# Patient Record
Sex: Male | Born: 1960 | Race: Black or African American | Hispanic: No | Marital: Married | Smoking: Never smoker
Health system: Southern US, Community
[De-identification: ages and names within clinical notes are randomized; demographics above are authoritative.]

## PROBLEM LIST (undated history)

## (undated) DIAGNOSIS — Z9889 Other specified postprocedural states: Secondary | ICD-10-CM

## (undated) DIAGNOSIS — R112 Nausea with vomiting, unspecified: Secondary | ICD-10-CM

## (undated) DIAGNOSIS — T8859XA Other complications of anesthesia, initial encounter: Secondary | ICD-10-CM

## (undated) DIAGNOSIS — M1990 Unspecified osteoarthritis, unspecified site: Secondary | ICD-10-CM

## (undated) DIAGNOSIS — R55 Syncope and collapse: Secondary | ICD-10-CM

## (undated) DIAGNOSIS — N186 End stage renal disease: Secondary | ICD-10-CM

## (undated) DIAGNOSIS — Z789 Other specified health status: Secondary | ICD-10-CM

## (undated) DIAGNOSIS — I6350 Cerebral infarction due to unspecified occlusion or stenosis of unspecified cerebral artery: Secondary | ICD-10-CM

## (undated) DIAGNOSIS — H548 Legal blindness, as defined in USA: Secondary | ICD-10-CM

## (undated) DIAGNOSIS — I639 Cerebral infarction, unspecified: Secondary | ICD-10-CM

## (undated) DIAGNOSIS — Z992 Dependence on renal dialysis: Secondary | ICD-10-CM

## (undated) DIAGNOSIS — N189 Chronic kidney disease, unspecified: Secondary | ICD-10-CM

## (undated) DIAGNOSIS — H5440 Blindness, one eye, unspecified eye: Secondary | ICD-10-CM

## (undated) DIAGNOSIS — IMO0001 Reserved for inherently not codable concepts without codable children: Secondary | ICD-10-CM

## (undated) DIAGNOSIS — I10 Essential (primary) hypertension: Secondary | ICD-10-CM

## (undated) DIAGNOSIS — T4145XA Adverse effect of unspecified anesthetic, initial encounter: Secondary | ICD-10-CM

## (undated) DIAGNOSIS — D649 Anemia, unspecified: Secondary | ICD-10-CM

## (undated) HISTORY — PX: HERNIA REPAIR: SHX51

## (undated) HISTORY — PX: INSERTION OF DIALYSIS CATHETER: SHX1324

## (undated) HISTORY — PX: EYE SURGERY: SHX253

## (undated) HISTORY — DX: Cerebral infarction due to unspecified occlusion or stenosis of unspecified cerebral artery: I63.50

---

## 1998-03-18 ENCOUNTER — Emergency Department (HOSPITAL_COMMUNITY): Admission: EM | Admit: 1998-03-18 | Discharge: 1998-03-18 | Payer: Self-pay | Admitting: Emergency Medicine

## 1999-02-09 ENCOUNTER — Emergency Department (HOSPITAL_COMMUNITY): Admission: EM | Admit: 1999-02-09 | Discharge: 1999-02-09 | Payer: Self-pay | Admitting: Emergency Medicine

## 1999-10-20 ENCOUNTER — Emergency Department (HOSPITAL_COMMUNITY): Admission: EM | Admit: 1999-10-20 | Discharge: 1999-10-20 | Payer: Self-pay | Admitting: Emergency Medicine

## 1999-11-28 ENCOUNTER — Emergency Department (HOSPITAL_COMMUNITY): Admission: EM | Admit: 1999-11-28 | Discharge: 1999-11-28 | Payer: Self-pay | Admitting: Emergency Medicine

## 2002-10-05 ENCOUNTER — Emergency Department (HOSPITAL_COMMUNITY): Admission: EM | Admit: 2002-10-05 | Discharge: 2002-10-05 | Payer: Self-pay | Admitting: Emergency Medicine

## 2002-10-05 ENCOUNTER — Encounter: Payer: Self-pay | Admitting: Emergency Medicine

## 2003-04-19 ENCOUNTER — Emergency Department (HOSPITAL_COMMUNITY): Admission: AD | Admit: 2003-04-19 | Discharge: 2003-04-19 | Payer: Self-pay | Admitting: Family Medicine

## 2005-02-07 ENCOUNTER — Emergency Department (HOSPITAL_COMMUNITY): Admission: EM | Admit: 2005-02-07 | Discharge: 2005-02-07 | Payer: Self-pay | Admitting: Emergency Medicine

## 2006-01-16 ENCOUNTER — Emergency Department (HOSPITAL_COMMUNITY): Admission: EM | Admit: 2006-01-16 | Discharge: 2006-01-16 | Payer: Self-pay | Admitting: Family Medicine

## 2006-09-09 ENCOUNTER — Ambulatory Visit (HOSPITAL_COMMUNITY): Admission: RE | Admit: 2006-09-09 | Discharge: 2006-09-09 | Payer: Self-pay | Admitting: Pulmonary Disease

## 2006-10-10 ENCOUNTER — Encounter: Admission: RE | Admit: 2006-10-10 | Discharge: 2007-01-08 | Payer: Self-pay | Admitting: Pulmonary Disease

## 2007-01-13 ENCOUNTER — Encounter: Admission: RE | Admit: 2007-01-13 | Discharge: 2007-01-13 | Payer: Self-pay | Admitting: Pulmonary Disease

## 2007-01-23 ENCOUNTER — Encounter (INDEPENDENT_AMBULATORY_CARE_PROVIDER_SITE_OTHER): Payer: Self-pay | Admitting: Pulmonary Disease

## 2007-01-23 ENCOUNTER — Ambulatory Visit (HOSPITAL_COMMUNITY): Admission: RE | Admit: 2007-01-23 | Discharge: 2007-01-23 | Payer: Self-pay | Admitting: Pulmonary Disease

## 2009-11-07 ENCOUNTER — Emergency Department (HOSPITAL_COMMUNITY): Admission: EM | Admit: 2009-11-07 | Discharge: 2009-11-07 | Payer: Self-pay | Admitting: Family Medicine

## 2010-01-25 ENCOUNTER — Inpatient Hospital Stay (HOSPITAL_COMMUNITY): Admission: EM | Admit: 2010-01-25 | Discharge: 2010-02-15 | Payer: Self-pay | Admitting: Emergency Medicine

## 2010-01-26 ENCOUNTER — Encounter (INDEPENDENT_AMBULATORY_CARE_PROVIDER_SITE_OTHER): Payer: Self-pay | Admitting: Internal Medicine

## 2010-01-27 ENCOUNTER — Encounter (INDEPENDENT_AMBULATORY_CARE_PROVIDER_SITE_OTHER): Payer: Self-pay | Admitting: Nephrology

## 2010-01-27 ENCOUNTER — Ambulatory Visit: Payer: Self-pay | Admitting: Vascular Surgery

## 2010-02-16 DIAGNOSIS — D509 Iron deficiency anemia, unspecified: Secondary | ICD-10-CM | POA: Insufficient documentation

## 2010-02-16 DIAGNOSIS — E782 Mixed hyperlipidemia: Secondary | ICD-10-CM | POA: Insufficient documentation

## 2010-02-16 DIAGNOSIS — E785 Hyperlipidemia, unspecified: Secondary | ICD-10-CM | POA: Insufficient documentation

## 2010-02-16 DIAGNOSIS — E213 Hyperparathyroidism, unspecified: Secondary | ICD-10-CM | POA: Insufficient documentation

## 2010-03-01 ENCOUNTER — Ambulatory Visit: Payer: Self-pay | Admitting: Vascular Surgery

## 2010-03-06 ENCOUNTER — Emergency Department (HOSPITAL_COMMUNITY)
Admission: EM | Admit: 2010-03-06 | Discharge: 2010-03-06 | Payer: Self-pay | Source: Home / Self Care | Admitting: Emergency Medicine

## 2010-06-06 ENCOUNTER — Ambulatory Visit: Payer: Self-pay | Admitting: Vascular Surgery

## 2010-06-06 ENCOUNTER — Ambulatory Visit (HOSPITAL_COMMUNITY)
Admission: RE | Admit: 2010-06-06 | Discharge: 2010-06-06 | Payer: Self-pay | Source: Home / Self Care | Admitting: Nephrology

## 2010-07-09 DIAGNOSIS — I639 Cerebral infarction, unspecified: Secondary | ICD-10-CM

## 2010-07-09 HISTORY — DX: Cerebral infarction, unspecified: I63.9

## 2010-07-11 ENCOUNTER — Emergency Department (HOSPITAL_COMMUNITY)
Admission: EM | Admit: 2010-07-11 | Discharge: 2010-07-11 | Payer: Self-pay | Source: Home / Self Care | Admitting: Emergency Medicine

## 2010-07-27 ENCOUNTER — Ambulatory Visit (HOSPITAL_COMMUNITY)
Admission: RE | Admit: 2010-07-27 | Discharge: 2010-07-27 | Payer: Self-pay | Source: Home / Self Care | Attending: Surgery | Admitting: Surgery

## 2010-07-28 NOTE — Op Note (Signed)
NAME:  Shawn [name], Shawn [name]        ACCOUNT NO.:  [id number]  MEDICAL RECORD NO.:  [id number]          PATIENT TYPE:  AMB  LOCATION:  SDS                          FACILITY:  [place]  PHYSICIAN:  Coralie Keens, M.D. DATE OF BIRTH:  [birth year]  DATE OF PROCEDURE:  07/27/2010 DATE OF DISCHARGE:  07/27/2010                              OPERATIVE REPORT   PREOPERATIVE DIAGNOSIS:  Bilateral inguinal hernias.  POSTOPERATIVE DIAGNOSIS:  Right inguinal hernia.  PROCEDURE:  Laparoscopic right inguinal repair with mesh.  SURGEON:  Coralie Keens, MD  ANESTHESIA:  General and 0.5% Marcaine.  ESTIMATED BLOOD LOSS:  Minimal.  INDICATIONS:  Shawn [name] is a 50 year old gentleman who presents with a very large right inguinal hernia.  There is also a question of left inguinal area physical exam and the decision was made to proceed with bilateral repair.  FINDINGS:  The patient was found to have incarcerated right inguinal hernia.  There was no evidence of left inguinal hernia.  The right inguinal hernia was an indirect hernia.  It was repaired with a 6-inch x 6-inch piece of Ultrapro mesh.  PROCEDURE IN DETAIL:  The patient was brought to the operating room and identified as Shawn [name].  He was placed on the operating table and general anesthesia was induced.  His abdomen was then prepped and draped in the usual sterile fashion.  Using a #15 blade, a small vertical incision made below the umbilicus.  This was carried down the fascia which was then opened with a scalpel.  The rectus muscle was then elevated.  The dissecting balloon was then passed underneath the rectus sheath and manipulated toward the pubis.  The dissecting balloon was then insufflated under direct vision dissecting out the preperitoneal space.  The dissecting balloon was then removed and insufflation was begun with carbon dioxide.  Two 5-mm ports were then placed in the patient's lower midline both  under direct vision.  Dissection was then carried out in the running inguinal area.  The testicular cord and structures were identified and attempt was made to completely reduce the contents of the hernia sac.  I had to open up the sac slightly and with direct pressure from outside I was finally able to reduce the colon and small bowel from the hernia sac.  I was then able to dissect the rest of the sac and reduce it from the testicular cord using the laparoscopic scissors as well.  Since the peritoneum was opened, I had to place a Veress needle in the left upper quadrant in order to decompress the abdomen.  This allowed me to maintain opening of the preperitoneal space.  Once everything was completely reduced, I examined the left inguinal area and found no evidence of indirect or direct inguinal hernia.  I then brought a 6-inch x 6-inch piece of Ultrapro mesh onto the field and fashioned appropriately.  I placed it through the umbilical port and then opened in an onlay fashion on the right inguinal floor.  I then tacked it to the pubic tubercle up the abdominal wall and out laterally.  Good coverage of inguinal floor and its contents appeared to be achieved.  I then  removed all of the ports and the mesh was seemed to lie appropriately as the preperitoneal space was collapsed.  I then closed the fascia at the umbilicus with figure-of- eight 0 Vicryl suture.  All incisions were anesthetized with Marcaine. I performed ilioinguinal nerve block with Marcaine as well.  All skin incisions were closed with a 4-0 Monocryl suture.  Steri-Strips and Band- Aids were then applied.  The patient tolerated the procedure well.  All counts were correct at the end of the procedure.  The patient was then extubated in the operating room and taken in stable condition to the recovery room.     Coralie Keens, M.D.     DB/MEDQ  D:  07/27/2010  T:  07/28/2010  Job:  [id number]  Electronically Signed by  Coralie Keens M.D. on 07/28/2010 11:03:49 AM

## 2010-07-31 LAB — PROTIME-INR: Prothrombin Time: 13.9 seconds (ref 11.6–15.2)

## 2010-07-31 LAB — GLUCOSE, CAPILLARY
Glucose-Capillary: 100 mg/dL — ABNORMAL HIGH (ref 70–99)
Glucose-Capillary: 114 mg/dL — ABNORMAL HIGH (ref 70–99)
Glucose-Capillary: 110 mg/dL — ABNORMAL HIGH (ref 70–99)

## 2010-07-31 LAB — BASIC METABOLIC PANEL
CO2: 29 mEq/L (ref 19–32)
Calcium: 9.1 mg/dL (ref 8.4–10.5)
Chloride: 101 mEq/L (ref 96–112)
Glucose, Bld: 112 mg/dL — ABNORMAL HIGH (ref 70–99)
Potassium: 4.9 mEq/L (ref 3.5–5.1)
Sodium: 139 mEq/L (ref 135–145)

## 2010-07-31 LAB — CBC
RBC: 4.8 MIL/uL (ref 4.22–5.81)
RDW: 15.9 % — ABNORMAL HIGH (ref 11.5–15.5)
WBC: 7.3 10*3/uL (ref 4.0–10.5)

## 2010-07-31 LAB — APTT: aPTT: 34 seconds (ref 24–37)

## 2010-08-10 ENCOUNTER — Ambulatory Visit: Payer: Self-pay

## 2010-08-17 ENCOUNTER — Ambulatory Visit (INDEPENDENT_AMBULATORY_CARE_PROVIDER_SITE_OTHER): Payer: Medicare Other

## 2010-08-17 DIAGNOSIS — N186 End stage renal disease: Secondary | ICD-10-CM

## 2010-08-17 DIAGNOSIS — T82898A Other specified complication of vascular prosthetic devices, implants and grafts, initial encounter: Secondary | ICD-10-CM

## 2010-08-18 NOTE — H&P (Signed)
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  HISTORY OF PRESENT ILLNESS:  Patient is a 50 year old gentleman who is on hemodialysis Monday, Wednesday, and Fridays at the [facility] with Dr. Moshe Cipro.  The patient developed approximately a month ago problems with his hand while on hemodialysis. He states his hand has become progressively weaker.  He has had reduced grip.  He notices that when he is on hemodialysis, he can hardly make a fist without pain.  He also develops numbness in this area.  These symptoms stop when he is off hemodialysis.  However, he will have continued achiness in his forearm and hand 1-2 hours after he is off the machine and also when he just pushes up from a sitting position.  PAST MEDICAL HISTORY:  Significant for end-stage renal disease, diabetes, hypertension, hyperparathyroidism, iron deficiency anemia, and hyperlipidemia.  REVIEW OF SYSTEMS:  A 10-point review of systems reviewed.  He has signs and symptoms of steal, as noted above.  Otherwise he denies any chest pain, shortness of breath, GI symptoms, or other musculoskeletal issues.  MEDICATIONS:  Tylenol 325 mg 2 tablets every 4 hours as needed for pain, Lipitor 40 mg daily, Os-Cal 500 mg 3 times a day with meals, Epogen 28,000 units with dialysis treatment, NovoLog 70/30 15 units every a.m. and 10 units in the p.m., INFeD 50 mg weekly on hemodialysis on Tuesdays, Normodyne 200 mg b.i.d., Fosrenol 1000 mg 3 times a day with meals, Nepro 237 ml 1 p.o. b.i.d., Zemplar 2 mcg every hemodialysis treatment, Nephro-Vite 1 tablet daily, sorbitol 30 ml at bedtime for constipation.  He is also on clonidine 0.1 mg 2 times a day as needed if diastolic pressures are greater than 90.  He is also started on lisinopril 40 mg daily for his  blood pressure.  VASCULAR DOPPLER:  The patient had good flow per Doppler in his radial, ulnar, and palmar arch.  PHYSICAL EXAMINATION:  A well-developed, well-nourished gentleman in no acute distress.  Heart rate was 90.  His blood pressure on the right side was 115/74.  Saturations were 100%.  Bilateral upper extremities: He has a 5/5 grip on the right and a -4/5 grip on the left.  He had a good thrill and bruit in the left Cimino fistula.  He had a palpable radial pulse beyond the fistula and as noted above, Doppler signal in the ulnar vessel and across the palmar arch.  His hand was warm and pink.  He had good sensation in the hand as well.  ASSESSMENT/PLAN:  Steal in the left upper extremity.  Plan is to ligate the fistula and place a catheter and possibly a new access in the future.  We will also repeat the vein mapping in the right upper extremity.  Wray Kearns, PA-C  Charles E. Fields, MD Electronically Signed  RR/MEDQ  D:  08/17/2010  T:  08/17/2010  Job:  [id number]

## 2010-08-29 ENCOUNTER — Ambulatory Visit (HOSPITAL_COMMUNITY)
Admission: RE | Admit: 2010-08-29 | Discharge: 2010-08-29 | Disposition: A | Discharge status: Home / Self Care | Payer: Medicare Other | Source: Ambulatory Visit | Attending: Vascular Surgery | Admitting: Vascular Surgery

## 2010-08-29 ENCOUNTER — Ambulatory Visit (HOSPITAL_COMMUNITY): Payer: Medicare Other

## 2010-08-29 DIAGNOSIS — E119 Type 2 diabetes mellitus without complications: Secondary | ICD-10-CM | POA: Insufficient documentation

## 2010-08-29 DIAGNOSIS — N186 End stage renal disease: Secondary | ICD-10-CM

## 2010-08-29 DIAGNOSIS — Z01818 Encounter for other preprocedural examination: Secondary | ICD-10-CM | POA: Insufficient documentation

## 2010-08-29 DIAGNOSIS — I120 Hypertensive chronic kidney disease with stage 5 chronic kidney disease or end stage renal disease: Secondary | ICD-10-CM

## 2010-08-29 DIAGNOSIS — T82898A Other specified complication of vascular prosthetic devices, implants and grafts, initial encounter: Secondary | ICD-10-CM

## 2010-08-29 DIAGNOSIS — Z01812 Encounter for preprocedural laboratory examination: Secondary | ICD-10-CM | POA: Insufficient documentation

## 2010-08-29 DIAGNOSIS — Y832 Surgical operation with anastomosis, bypass or graft as the cause of abnormal reaction of the patient, or of later complication, without mention of misadventure at the time of the procedure: Secondary | ICD-10-CM | POA: Insufficient documentation

## 2010-08-29 LAB — GLUCOSE, CAPILLARY
Glucose-Capillary: 116 mg/dL — ABNORMAL HIGH (ref 70–99)
Glucose-Capillary: 102 mg/dL — ABNORMAL HIGH (ref 70–99)

## 2010-08-29 LAB — POCT I-STAT 4, (NA,K, GLUC, HGB,HCT)
Glucose, Bld: 115 mg/dL — ABNORMAL HIGH (ref 70–99)
HCT: 38 % — ABNORMAL LOW (ref 39.0–52.0)

## 2010-08-29 LAB — SURGICAL PCR SCREEN
MRSA, PCR: NEGATIVE
Staphylococcus aureus: NEGATIVE

## 2010-08-30 NOTE — Op Note (Signed)
NAME:  MAZIN, FLORKOWSKI        ACCOUNT NO.:  [id number]  MEDICAL RECORD NO.:  [id number]           PATIENT TYPE:  O  LOCATION:  SDSC                         FACILITY:  [place]  PHYSICIAN:  Verlyn Lambert E. Jessilynn Taft, MD  DATE OF BIRTH:  01/16/1961  DATE OF PROCEDURE:  08/29/2010 DATE OF DISCHARGE:  08/29/2010                              OPERATIVE REPORT   PROCEDURES: 1. Ultrasound of the neck. 2. Attempted right internal jugular vein catheter. 3. Placement of left internal jugular vein Diatek catheter. 4. Ligation of left radiocephalic AV fistula.  PREOPERATIVE DIAGNOSIS:  Ischemic steal left hand.  POSTOPERATIVE DIAGNOSIS:  Ischemic steal left hand.  ANESTHESIA:  General.  OPERATIVE FINDINGS:  A 23-cm catheter in left internal jugular vein.  OPERATIVE DETAILS:  After obtaining informed consent, the patient was taken to the operating room.  The patient was placed in a supine position on the operating table.  After induction of general anesthesia and placement of laryngeal mask, the patient's entire neck and chest were prepped and draped in usual sterile fashion.  An ultrasound was used to identify the right internal jugular vein.  This was fairly small, but appeared to be compressible; however, on attempting to cannulate the right internal jugular vein with an introducer needle, I was not able to aspirate blood back.  Therefore, attempts were abandoned on the right side.  Attention was then turned to the left internal jugular vein.  This had normal compressibility and respiratory variation.  An introducer needle was introduced easily into the left internal jugular vein and there was good blood return.  A 0.035 J-tipped guidewire was then threaded to the left internal jugular vein down into the inferior vena cava under fluoroscopic guidance.  Next, sequential 12, 14, and 16-French dilators with peel-away sheath placed over the guidewire into the right atrium.  Guidewire and  dilator were removed and a 23-cm Diatek catheter was placed through the left internal jugular vein down in the right atrium under fluoroscopic guidance.  Catheter was tunneled subcutaneously, cut to length, and hub attached.  Catheter was noted to flush and draw easily.  Catheter was sutured to skin with nylon sutures.  The neck insertion site was closed with Vicryl stitch. Catheter was inspected under fluoroscopy, found its tip to be in the right atrium, no kinks throughout its course.  Catheter was then loaded with concentrated heparin solution.  Attention was then turned to the patient's left upper extremity.  The patient was prepped and draped in usual sterile fashion from the elbow down to the hand.  A longitudinal incision was then made through a preexisting scar on the radial aspect of the left forearm.  Incision was carried down through the subcutaneous tissues down to the level of preexisting fistula.  There was flow through the fistula.  The fistula was dissected free circumferentially approximately 1 cm distal to the anastomosis and ligated twice with 2-0 silk tie.  There was a good palpable radial pulse below the fistula after ligation of the fistula.  Next, the skin was reapproximated using running 4-0 Vicryl subcuticular stitch and Dermabond applied to the skin.  The patient tolerated the procedure well and  there were no complications.  Instrument, sponge, and needle counts were correct at the end of the case.  The patient was taken to the recovery room in a stable condition.     Jessy Oto. Carder Yin, MD     CEF/MEDQ  D:  08/29/2010  T:  08/30/2010  Job:  [id number]  Electronically Signed by Ruta Hinds MD on 08/30/2010 03:10:11 PM

## 2010-09-07 ENCOUNTER — Encounter (INDEPENDENT_AMBULATORY_CARE_PROVIDER_SITE_OTHER): Payer: Medicare Other

## 2010-09-07 ENCOUNTER — Ambulatory Visit (INDEPENDENT_AMBULATORY_CARE_PROVIDER_SITE_OTHER): Payer: Medicare Other | Admitting: Vascular Surgery

## 2010-09-07 DIAGNOSIS — Z01810 Encounter for preprocedural cardiovascular examination: Secondary | ICD-10-CM

## 2010-09-07 DIAGNOSIS — N186 End stage renal disease: Secondary | ICD-10-CM

## 2010-09-08 NOTE — Assessment & Plan Note (Signed)
OFFICE VISIT  Shawn [name], NORMILE DOB:  12-07-1960                                       09/07/2010 [id number]  The patient returns for followup today.  He recently had ligation of the left radiocephalic AV fistula and placement of a left internal jugular vein Diatek catheter.  This was done for steal.  On exam today in the office he has regained significant motor strength in his left hand and is now able to fully form a fist and has essentially 4/5 motor strength in his left hand.  He still has some sensory deficit.  Overall the left arm is improved.  The incision is healing well.  As far as planning his new access right upper extremity has a 2+ brachial and radial pulse.  He had a vein mapping ultrasound today which shows cephalic vein in the upper arm is between 25 mm and 35 mm in diameter.  The forearm cephalic vein is quite small.  The basilic vein in the upper arm was 22 mm to 28 mm in diameter.  I believe the best option at this point for the patient would be placement of a right brachiocephalic AV fistula.  I did discuss with him today that he still would be at risk for possible steal once again and also risk of nonmaturation of the fistula.  He understands and agrees to proceed.  He wished to delay his fistula placement for a few weeks because he stated he had been having an operation essentially once every few weeks for the last 6 months.  We have scheduled his fistula placement for 09/26/2010.    Jessy Oto. Fields, MD Electronically Signed  CEF/MEDQ  D:  09/07/2010  T:  09/08/2010  Job:  [id number]  cc:   [facility]

## 2010-09-21 LAB — BASIC METABOLIC PANEL
BUN: 31 mg/dL — ABNORMAL HIGH (ref 6–23)
CO2: 28 mEq/L (ref 19–32)
Chloride: 100 mEq/L (ref 96–112)
GFR calc Af Amer: 6 mL/min — ABNORMAL LOW (ref >60)
Glucose, Bld: 90 mg/dL (ref 70–99)

## 2010-09-21 LAB — CBC
MCH: 27.2 pg (ref 26.0–34.0)
MCV: 85.3 fL (ref 78.0–100.0)
Platelets: 204 10*3/uL (ref 150–400)
RBC: 4.7 MIL/uL (ref 4.22–5.81)

## 2010-09-21 LAB — POCT I-STAT, CHEM 8: BUN: 36 mg/dL — ABNORMAL HIGH (ref 6–23)

## 2010-09-21 LAB — DIFFERENTIAL
Basophils Absolute: 0.1 10*3/uL (ref 0.0–0.1)
Eosinophils Absolute: 0.5 10*3/uL (ref 0.0–0.7)
Lymphocytes Relative: 23 % (ref 12–46)
Monocytes Absolute: 0.5 10*3/uL (ref 0.1–1.0)
Monocytes Relative: 7 % (ref 3–12)
Neutro Abs: 4.5 10*3/uL (ref 1.7–7.7)
Neutrophils Relative %: 63 % (ref 43–77)

## 2010-09-21 LAB — GLUCOSE, CAPILLARY

## 2010-09-21 LAB — POCT CARDIAC MARKERS: Troponin i, poc: <0.05 ng/mL (ref 0.00–0.09)

## 2010-09-22 LAB — COMPREHENSIVE METABOLIC PANEL
AST: 18 U/L (ref 0–37)
BUN: 28 mg/dL — ABNORMAL HIGH (ref 6–23)
CO2: 25 mEq/L (ref 19–32)
Calcium: 8 mg/dL — ABNORMAL LOW (ref 8.4–10.5)
Chloride: 102 mEq/L (ref 96–112)
Creatinine, Ser: 8.48 mg/dL — ABNORMAL HIGH (ref 0.4–1.5)
GFR calc Af Amer: 8 mL/min — ABNORMAL LOW (ref >60)
GFR calc non Af Amer: 7 mL/min — ABNORMAL LOW (ref >60)
Glucose, Bld: 155 mg/dL — ABNORMAL HIGH (ref 70–99)
Total Bilirubin: 0.7 mg/dL (ref 0.3–1.2)

## 2010-09-22 LAB — RENAL FUNCTION PANEL
Albumin: 2.6 g/dL — ABNORMAL LOW (ref 3.5–5.2)
Albumin: 2.7 g/dL — ABNORMAL LOW (ref 3.5–5.2)
Albumin: 2.7 g/dL — ABNORMAL LOW (ref 3.5–5.2)
Calcium: 8.2 mg/dL — ABNORMAL LOW (ref 8.4–10.5)
Calcium: 8.4 mg/dL (ref 8.4–10.5)
Chloride: 102 mEq/L (ref 96–112)
Chloride: 102 mEq/L (ref 96–112)
Creatinine, Ser: 7.21 mg/dL — ABNORMAL HIGH (ref 0.4–1.5)
Creatinine, Ser: 8.69 mg/dL — ABNORMAL HIGH (ref 0.4–1.5)
GFR calc Af Amer: 10 mL/min — ABNORMAL LOW (ref >60)
GFR calc Af Amer: 8 mL/min — ABNORMAL LOW (ref >60)
GFR calc Af Amer: 8 mL/min — ABNORMAL LOW (ref >60)
GFR calc non Af Amer: 6 mL/min — ABNORMAL LOW (ref >60)
GFR calc non Af Amer: 7 mL/min — ABNORMAL LOW (ref >60)
GFR calc non Af Amer: 8 mL/min — ABNORMAL LOW (ref >60)
Phosphorus: 4.5 mg/dL (ref 2.3–4.6)
Potassium: 3.8 mEq/L (ref 3.5–5.1)

## 2010-09-22 LAB — GLUCOSE, CAPILLARY
Glucose-Capillary: 115 mg/dL — ABNORMAL HIGH (ref 70–99)
Glucose-Capillary: 125 mg/dL — ABNORMAL HIGH (ref 70–99)
Glucose-Capillary: 126 mg/dL — ABNORMAL HIGH (ref 70–99)
Glucose-Capillary: 145 mg/dL — ABNORMAL HIGH (ref 70–99)
Glucose-Capillary: 163 mg/dL — ABNORMAL HIGH (ref 70–99)
Glucose-Capillary: 167 mg/dL — ABNORMAL HIGH (ref 70–99)
Glucose-Capillary: 204 mg/dL — ABNORMAL HIGH (ref 70–99)
Glucose-Capillary: 222 mg/dL — ABNORMAL HIGH (ref 70–99)
Glucose-Capillary: 260 mg/dL — ABNORMAL HIGH (ref 70–99)
Glucose-Capillary: 264 mg/dL — ABNORMAL HIGH (ref 70–99)
Glucose-Capillary: 68 mg/dL — ABNORMAL LOW (ref 70–99)
Glucose-Capillary: 79 mg/dL (ref 70–99)
Glucose-Capillary: 89 mg/dL (ref 70–99)

## 2010-09-22 LAB — CBC
HCT: 27.9 % — ABNORMAL LOW (ref 39.0–52.0)
Hemoglobin: 8.9 g/dL — ABNORMAL LOW (ref 13.0–17.0)
MCH: 27.4 pg (ref 26.0–34.0)
MCH: 27.5 pg (ref 26.0–34.0)
MCH: 29.2 pg (ref 26.0–34.0)
MCHC: 30.7 g/dL (ref 30.0–36.0)
MCHC: 31.8 g/dL (ref 30.0–36.0)
MCV: 91.8 fL (ref 78.0–100.0)
Platelets: 289 10*3/uL (ref 150–400)
Platelets: 318 10*3/uL (ref 150–400)
Platelets: 327 10*3/uL (ref 150–400)
RBC: 3.04 MIL/uL — ABNORMAL LOW (ref 4.22–5.81)
RBC: 3.51 MIL/uL — ABNORMAL LOW (ref 4.22–5.81)
RBC: 3.81 MIL/uL — ABNORMAL LOW (ref 4.22–5.81)
WBC: 10.4 10*3/uL (ref 4.0–10.5)

## 2010-09-22 LAB — PHOSPHORUS: Phosphorus: 5 mg/dL — ABNORMAL HIGH (ref 2.3–4.6)

## 2010-09-22 NOTE — Procedures (Unsigned)
CEPHALIC VEIN MAPPING  INDICATION:  Preoperative AVF placement.  HISTORY: End-stage renal disease, left ligated AVF due to steal syndrome.  EXAM: The right cephalic vein is compressible.  Diameter measurements range from 0.19 to 0.37 cm.  The right basilic vein is compressible.  Diameter measurements range from 0.18 to 0.28 cm.  See attached worksheet for all measurements.  IMPRESSION:  Patent right cephalic and basilic veins with diameter measurements as described above.  ___________________________________________ Jessy Oto. Oneida Alar, MD  EM/MEDQ  D:  09/07/2010  T:  09/07/2010  Job:  [id number]

## 2010-09-23 LAB — RENAL FUNCTION PANEL
Albumin: 2.4 g/dL — ABNORMAL LOW (ref 3.5–5.2)
BUN: 32 mg/dL — ABNORMAL HIGH (ref 6–23)
CO2: 18 mEq/L — ABNORMAL LOW (ref 19–32)
CO2: 26 mEq/L (ref 19–32)
Calcium: 7.7 mg/dL — ABNORMAL LOW (ref 8.4–10.5)
Calcium: 8 mg/dL — ABNORMAL LOW (ref 8.4–10.5)
Calcium: 8.1 mg/dL — ABNORMAL LOW (ref 8.4–10.5)
Chloride: 103 mEq/L (ref 96–112)
Chloride: 103 mEq/L (ref 96–112)
Chloride: 107 mEq/L (ref 96–112)
Creatinine, Ser: 10.63 mg/dL — ABNORMAL HIGH (ref 0.4–1.5)
GFR calc Af Amer: 11 mL/min — ABNORMAL LOW (ref >60)
GFR calc Af Amer: 6 mL/min — ABNORMAL LOW (ref >60)
GFR calc Af Amer: 8 mL/min — ABNORMAL LOW (ref >60)
GFR calc Af Amer: 9 mL/min — ABNORMAL LOW (ref >60)
GFR calc non Af Amer: 5 mL/min — ABNORMAL LOW (ref >60)
GFR calc non Af Amer: 7 mL/min — ABNORMAL LOW (ref >60)
GFR calc non Af Amer: 7 mL/min — ABNORMAL LOW (ref >60)
GFR calc non Af Amer: 9 mL/min — ABNORMAL LOW (ref >60)
Glucose, Bld: 153 mg/dL — ABNORMAL HIGH (ref 70–99)
Phosphorus: 4.1 mg/dL (ref 2.3–4.6)
Potassium: 4.1 mEq/L (ref 3.5–5.1)
Sodium: 136 mEq/L (ref 135–145)
Sodium: 138 mEq/L (ref 135–145)
Sodium: 138 mEq/L (ref 135–145)

## 2010-09-23 LAB — GLUCOSE, CAPILLARY
Glucose-Capillary: 105 mg/dL — ABNORMAL HIGH (ref 70–99)
Glucose-Capillary: 110 mg/dL — ABNORMAL HIGH (ref 70–99)
Glucose-Capillary: 129 mg/dL — ABNORMAL HIGH (ref 70–99)
Glucose-Capillary: 130 mg/dL — ABNORMAL HIGH (ref 70–99)
Glucose-Capillary: 132 mg/dL — ABNORMAL HIGH (ref 70–99)
Glucose-Capillary: 144 mg/dL — ABNORMAL HIGH (ref 70–99)
Glucose-Capillary: 159 mg/dL — ABNORMAL HIGH (ref 70–99)
Glucose-Capillary: 181 mg/dL — ABNORMAL HIGH (ref 70–99)
Glucose-Capillary: 187 mg/dL — ABNORMAL HIGH (ref 70–99)
Glucose-Capillary: 198 mg/dL — ABNORMAL HIGH (ref 70–99)
Glucose-Capillary: 200 mg/dL — ABNORMAL HIGH (ref 70–99)
Glucose-Capillary: 212 mg/dL — ABNORMAL HIGH (ref 70–99)
Glucose-Capillary: 217 mg/dL — ABNORMAL HIGH (ref 70–99)
Glucose-Capillary: 222 mg/dL — ABNORMAL HIGH (ref 70–99)
Glucose-Capillary: 231 mg/dL — ABNORMAL HIGH (ref 70–99)
Glucose-Capillary: 240 mg/dL — ABNORMAL HIGH (ref 70–99)
Glucose-Capillary: 261 mg/dL — ABNORMAL HIGH (ref 70–99)
Glucose-Capillary: 272 mg/dL — ABNORMAL HIGH (ref 70–99)
Glucose-Capillary: 299 mg/dL — ABNORMAL HIGH (ref 70–99)
Glucose-Capillary: 33 mg/dL — CL (ref 70–99)
Glucose-Capillary: 34 mg/dL — CL (ref 70–99)
Glucose-Capillary: 75 mg/dL (ref 70–99)
Glucose-Capillary: 78 mg/dL (ref 70–99)
Glucose-Capillary: <10 mg/dL — CL (ref 70–99)
Glucose-Capillary: <10 mg/dL — CL (ref 70–99)

## 2010-09-23 LAB — CBC
HCT: 21.4 % — ABNORMAL LOW (ref 39.0–52.0)
HCT: 22 % — ABNORMAL LOW (ref 39.0–52.0)
HCT: 23.8 % — ABNORMAL LOW (ref 39.0–52.0)
Hemoglobin: 7.3 g/dL — ABNORMAL LOW (ref 13.0–17.0)
Hemoglobin: 7.8 g/dL — ABNORMAL LOW (ref 13.0–17.0)
Hemoglobin: 8.1 g/dL — ABNORMAL LOW (ref 13.0–17.0)
MCH: 28.6 pg (ref 26.0–34.0)
MCH: 28.8 pg (ref 26.0–34.0)
MCH: 29 pg (ref 26.0–34.0)
MCH: 29.1 pg (ref 26.0–34.0)
MCHC: 32.9 g/dL (ref 30.0–36.0)
MCHC: 33.2 g/dL (ref 30.0–36.0)
MCHC: 33.3 g/dL (ref 30.0–36.0)
MCV: 86.3 fL (ref 78.0–100.0)
MCV: 87.1 fL (ref 78.0–100.0)
MCV: 89.6 fL (ref 78.0–100.0)
Platelets: 320 10*3/uL (ref 150–400)
RBC: 2.54 MIL/uL — ABNORMAL LOW (ref 4.22–5.81)
RBC: 2.65 MIL/uL — ABNORMAL LOW (ref 4.22–5.81)
RBC: 2.82 MIL/uL — ABNORMAL LOW (ref 4.22–5.81)
RDW: 15.3 % (ref 11.5–15.5)
RDW: 15.5 % (ref 11.5–15.5)
RDW: 15.9 % — ABNORMAL HIGH (ref 11.5–15.5)
RDW: 16.2 % — ABNORMAL HIGH (ref 11.5–15.5)
WBC: 10.9 10*3/uL — ABNORMAL HIGH (ref 4.0–10.5)
WBC: 11.3 10*3/uL — ABNORMAL HIGH (ref 4.0–10.5)
WBC: 11.6 10*3/uL — ABNORMAL HIGH (ref 4.0–10.5)
WBC: 12.4 10*3/uL — ABNORMAL HIGH (ref 4.0–10.5)
WBC: 7.1 10*3/uL (ref 4.0–10.5)

## 2010-09-23 LAB — DIFFERENTIAL
Basophils Absolute: 0.1 10*3/uL (ref 0.0–0.1)
Basophils Relative: 1 % (ref 0–1)
Eosinophils Absolute: 0.6 10*3/uL (ref 0.0–0.7)
Monocytes Absolute: 0.5 10*3/uL (ref 0.1–1.0)
Monocytes Relative: 6 % (ref 3–12)
Neutro Abs: 5.5 10*3/uL (ref 1.7–7.7)
Neutrophils Relative %: 64 % (ref 43–77)

## 2010-09-23 LAB — COMPREHENSIVE METABOLIC PANEL
Alkaline Phosphatase: 69 U/L (ref 39–117)
BUN: 58 mg/dL — ABNORMAL HIGH (ref 6–23)
Chloride: 110 mEq/L (ref 96–112)
Creatinine, Ser: 11.17 mg/dL — ABNORMAL HIGH (ref 0.4–1.5)
Glucose, Bld: 133 mg/dL — ABNORMAL HIGH (ref 70–99)
Potassium: 4.4 mEq/L (ref 3.5–5.1)
Total Bilirubin: 0.8 mg/dL (ref 0.3–1.2)
Total Protein: 5 g/dL — ABNORMAL LOW (ref 6.0–8.3)

## 2010-09-23 LAB — BASIC METABOLIC PANEL
BUN: 63 mg/dL — ABNORMAL HIGH (ref 6–23)
BUN: 65 mg/dL — ABNORMAL HIGH (ref 6–23)
CO2: 17 mEq/L — ABNORMAL LOW (ref 19–32)
CO2: 20 mEq/L (ref 19–32)
Calcium: 7.7 mg/dL — ABNORMAL LOW (ref 8.4–10.5)
Calcium: 7.8 mg/dL — ABNORMAL LOW (ref 8.4–10.5)
Chloride: 101 mEq/L (ref 96–112)
Creatinine, Ser: 11.69 mg/dL — ABNORMAL HIGH (ref 0.4–1.5)
Creatinine, Ser: 12.14 mg/dL — ABNORMAL HIGH (ref 0.4–1.5)
GFR calc Af Amer: 6 mL/min — ABNORMAL LOW (ref >60)
GFR calc non Af Amer: 5 mL/min — ABNORMAL LOW (ref >60)
Glucose, Bld: 178 mg/dL — ABNORMAL HIGH (ref 70–99)
Glucose, Bld: 226 mg/dL — ABNORMAL HIGH (ref 70–99)
Potassium: 4.8 mEq/L (ref 3.5–5.1)
Sodium: 134 mEq/L — ABNORMAL LOW (ref 135–145)

## 2010-09-23 LAB — DRUGS OF ABUSE SCREEN W/O ALC, ROUTINE URINE
Amphetamine Screen, Ur: NEGATIVE
Barbiturate Quant, Ur: NEGATIVE
Benzodiazepines.: NEGATIVE
Marijuana Metabolite: NEGATIVE
Methadone: NEGATIVE
Phencyclidine (PCP): NEGATIVE
Propoxyphene: NEGATIVE

## 2010-09-23 LAB — HEPATIC FUNCTION PANEL
ALT: 9 U/L (ref 0–53)
AST: 14 U/L (ref 0–37)
Albumin: 3.2 g/dL — ABNORMAL LOW (ref 3.5–5.2)
Alkaline Phosphatase: 86 U/L (ref 39–117)
Total Protein: 6.2 g/dL (ref 6.0–8.3)

## 2010-09-23 LAB — URINE MICROSCOPIC-ADD ON

## 2010-09-23 LAB — IRON AND TIBC
Iron: 33 ug/dL — ABNORMAL LOW (ref 42–135)
Saturation Ratios: 17 % — ABNORMAL LOW (ref 20–55)
Saturation Ratios: 18 % — ABNORMAL LOW (ref 20–55)
TIBC: 192 ug/dL — ABNORMAL LOW (ref 215–435)
TIBC: 195 ug/dL — ABNORMAL LOW (ref 215–435)
UIBC: 157 ug/dL
UIBC: 162 ug/dL

## 2010-09-23 LAB — URINALYSIS, ROUTINE W REFLEX MICROSCOPIC
Bilirubin Urine: NEGATIVE
Nitrite: NEGATIVE
Protein, ur: >300 mg/dL — AB
Specific Gravity, Urine: 1.017 (ref 1.005–1.030)
Urobilinogen, UA: 0.2 mg/dL (ref 0.0–1.0)

## 2010-09-23 LAB — HEPATITIS B CORE ANTIBODY, TOTAL: Hep B Core Total Ab: NEGATIVE

## 2010-09-23 LAB — PTH, INTACT AND CALCIUM: PTH: 345.5 pg/mL — ABNORMAL HIGH (ref 14.0–72.0)

## 2010-09-23 LAB — VITAMIN D 25 HYDROXY (VIT D DEFICIENCY, FRACTURES): Vit D, 25-Hydroxy: <10 ng/mL — ABNORMAL LOW (ref 30–89)

## 2010-09-23 LAB — VITAMIN B12: Vitamin B-12: 234 pg/mL (ref 211–911)

## 2010-09-23 LAB — PROTEIN, URINE, RANDOM: Total Protein, Urine: 343 mg/dL

## 2010-09-23 LAB — CHLORIDE, URINE, RANDOM: Chloride Urine: 94 mEq/L

## 2010-09-23 LAB — LIPID PANEL
Cholesterol: 207 mg/dL — ABNORMAL HIGH (ref 0–200)
Triglycerides: 115 mg/dL (ref <150)

## 2010-09-23 LAB — FERRITIN: Ferritin: 458 ng/mL — ABNORMAL HIGH (ref 22–322)

## 2010-09-23 LAB — HEPATITIS B SURFACE ANTIBODY,QUALITATIVE: Hep B S Ab: NEGATIVE

## 2010-09-23 LAB — HEMOGLOBIN A1C
Hgb A1c MFr Bld: 6.8 % — ABNORMAL HIGH (ref <5.7)
Mean Plasma Glucose: 148 mg/dL — ABNORMAL HIGH (ref <117)

## 2010-09-23 LAB — RETICULOCYTES: RBC.: 2.66 MIL/uL — ABNORMAL LOW (ref 4.22–5.81)

## 2010-09-23 LAB — PTH-RELATED PEPTIDE

## 2010-09-26 ENCOUNTER — Ambulatory Visit (HOSPITAL_COMMUNITY)
Admission: RE | Admit: 2010-09-26 | Discharge: 2010-09-26 | Disposition: A | Discharge status: Home / Self Care | Payer: Medicare Other | Source: Ambulatory Visit | Attending: Vascular Surgery | Admitting: Vascular Surgery

## 2010-09-26 DIAGNOSIS — N186 End stage renal disease: Secondary | ICD-10-CM | POA: Insufficient documentation

## 2010-09-26 DIAGNOSIS — I120 Hypertensive chronic kidney disease with stage 5 chronic kidney disease or end stage renal disease: Secondary | ICD-10-CM

## 2010-09-26 LAB — POCT I-STAT 4, (NA,K, GLUC, HGB,HCT): Sodium: 135 mEq/L (ref 135–145)

## 2010-09-26 LAB — GLUCOSE, CAPILLARY
Glucose-Capillary: 89 mg/dL (ref 70–99)
Glucose-Capillary: 95 mg/dL (ref 70–99)

## 2010-10-05 NOTE — Op Note (Signed)
NAME:  Shawn [name], Shawn [name]        ACCOUNT NO.:  [id number]  MEDICAL RECORD NO.:  [id number]           PATIENT TYPE:  O  LOCATION:  SDSC                         FACILITY:  [place]  PHYSICIAN:  Charles E. Fields, MD  DATE OF BIRTH:  [birth year]  DATE OF PROCEDURE:  09/26/2010 DATE OF DISCHARGE:  09/26/2010                              OPERATIVE REPORT   PROCEDURE:  Right brachiocephalic arteriovenous fistula.  PREOPERATIVE DIAGNOSIS:  End-stage renal disease.  POSTOPERATIVE DIAGNOSIS:  End-stage renal disease.  ANESTHESIA:  Local with IV sedation.  ASSISTANT:  Leta Baptist, PA-C  OPERATIVE FINDINGS:  [id number] right cephalic vein.  OPERATIVE DETAILS:  After obtaining informed consent, the patient was taken to the operating room.  The patient was placed in a supine position on the operating table.  The patient's entire right upper extremity was prepped and draped in usual sterile fashion after adequate sedation.  Local anesthesia was then infiltrated in the right antecubital crease.  A transverse incision was made in this location and carried down through subcutaneous tissues down to the level of the cephalic vein.  There were some spasm within the vein, but overall was above 2 mm in diameter.  It did become slightly larger in caliber as we proceeded up the arm.  Multiple side branches were ligated and divided between silk ties or clips on the vein.  Next, the brachial artery was dissected free in the medial portion of the incision.  This was of good quality approximately 3 mm in diameter.  This was dissected free circumferentially and vessel loops were placed around this.  The patient was given 5000 units of intravenous heparin.  After 2 minutes of circulation time, the distal cephalic vein was ligated with 2-0 silk tie and vein transected and swung over the level of the artery.  It was marked for orientation and gently distended with heparinized saline. Vessel loops were used  to control the artery proximally and distally.  A longitudinal opening was made in the brachial artery and the vein was sewn end of vein to side of artery using a running 7-0 Prolene suture. Just prior to completion, anastomosis was fore bled, back bled, and thoroughly flushed.  Anastomosis was secured, clamps were released. There was pulsatile flow in the proximal fistula.  This was examined and up underneath the skin flap and the upper portion the arm.  It was noted that there was a 75% twist in the vein.  At this point, the vein was further mobilized up the arm.  The artery was recontrolled with vessel loops and the anastomosis taken down.  The vein was then untwisted and gently distended with heparinized saline and carefully marked to make sure again that there were no twists.  Anastomosis was then reconstructed using a running 7-0 Prolene suture end of vein to side of artery.  Just prior to completion, anastomosis was fore bled, back bled, and thoroughly flushed.  Anastomosis was secured.  Vessel loops were released.  There was palpable thrill in the fistula immediately.  There was good Doppler flow in the fistula to the level of the shoulder.  The patient also had good radial artery Doppler  flow.  Hemostasis was obtained.  Subcutaneous tissues were reapproximated using running 3-0 Vicryl suture.  Skin was closed with 4-0 Vicryl subcuticular stitch.  The patient tolerated the procedure well and there were no complications.  Instrument, sponge, and needle counts were correct at the end of the case.  The patient was taken to the recovery room in stable condition.     Jessy Oto. Fields, MD     CEF/MEDQ  D:  09/26/2010  T:  09/27/2010  Job:  [id number]  Electronically Signed by Ruta Hinds MD on 10/05/2010 10:53:32 AM

## 2010-10-08 HISTORY — PX: OTHER SURGICAL HISTORY: SHX169

## 2010-10-12 ENCOUNTER — Ambulatory Visit (INDEPENDENT_AMBULATORY_CARE_PROVIDER_SITE_OTHER): Payer: Medicare Other | Admitting: Vascular Surgery

## 2010-10-12 DIAGNOSIS — N186 End stage renal disease: Secondary | ICD-10-CM

## 2010-10-12 NOTE — Assessment & Plan Note (Signed)
OFFICE VISIT  Shawn [name], Shawn [name] DOB:  May 04, 1961                                       10/12/2010 [id number]  Patient returns for follow-up today.  He had a right brachiocephalic AV fistula placed on March 20.  He returns today with occlusion of this fistula.  He previously had a left radiocephalic AV fistula which was ligated for steal.  He is currently dialyzing via a left internal jugular vein catheter.  I reviewed his vein mapping ultrasound which showed that the vein in the right arm was fairly marginal.  I do not believe he is a further candidate for fistulas in the right or left arm.  On physical exam today, blood pressure is 130/84 in the left arm, heart rate is 92 and regular, respirations 18.  Right upper extremity:  There is a well-healed antecubital scar.  There is no audible bruit or palpable thrill in the fistula.  The hand is pink, warm, and well- perfused.  I believe the best option for patient at this point would be placement of a right arm AV graft.  This is most likely going to need to be a right upper arm AV graft.  We will use a 4-7 mm tapered PTFE graft in order to try to avoid steal in the right arm.  We will try to avoid going back to the left arm because of previous steal symptoms.  Risks, benefits, possible complications, and procedure details were explained to the patient today, including but not limited to ischemic steal, graft thrombosis, infection, bleeding.  He understands and agrees to proceed. We have decided to give him a few more weeks to continue to heal the wounds in his right arm.  His graft will be placed on October 31, 2010.    Jessy Oto. Fields, MD Electronically Signed  CEF/MEDQ  D:  10/12/2010  T:  10/12/2010  Job:  [id number]  cc:   [facility]

## 2010-10-25 ENCOUNTER — Emergency Department (HOSPITAL_COMMUNITY): Payer: Medicare Other

## 2010-10-25 ENCOUNTER — Inpatient Hospital Stay (HOSPITAL_COMMUNITY)
Admission: EM | Admit: 2010-10-25 | Discharge: 2010-10-30 | DRG: 064 | Disposition: A | Discharge status: Home Health | Payer: Medicare Other | Attending: Internal Medicine | Admitting: Internal Medicine

## 2010-10-25 DIAGNOSIS — E1139 Type 2 diabetes mellitus with other diabetic ophthalmic complication: Secondary | ICD-10-CM | POA: Diagnosis present

## 2010-10-25 DIAGNOSIS — I120 Hypertensive chronic kidney disease with stage 5 chronic kidney disease or end stage renal disease: Secondary | ICD-10-CM | POA: Diagnosis present

## 2010-10-25 DIAGNOSIS — E11319 Type 2 diabetes mellitus with unspecified diabetic retinopathy without macular edema: Secondary | ICD-10-CM | POA: Diagnosis present

## 2010-10-25 DIAGNOSIS — N186 End stage renal disease: Secondary | ICD-10-CM | POA: Diagnosis present

## 2010-10-25 DIAGNOSIS — Z992 Dependence on renal dialysis: Secondary | ICD-10-CM

## 2010-10-25 DIAGNOSIS — R269 Unspecified abnormalities of gait and mobility: Secondary | ICD-10-CM | POA: Diagnosis present

## 2010-10-25 DIAGNOSIS — Z794 Long term (current) use of insulin: Secondary | ICD-10-CM

## 2010-10-25 DIAGNOSIS — H548 Legal blindness, as defined in USA: Secondary | ICD-10-CM | POA: Diagnosis present

## 2010-10-25 DIAGNOSIS — Z79899 Other long term (current) drug therapy: Secondary | ICD-10-CM

## 2010-10-25 DIAGNOSIS — I6330 Cerebral infarction due to thrombosis of unspecified cerebral artery: Principal | ICD-10-CM | POA: Diagnosis present

## 2010-10-25 DIAGNOSIS — D509 Iron deficiency anemia, unspecified: Secondary | ICD-10-CM | POA: Diagnosis present

## 2010-10-25 LAB — BASIC METABOLIC PANEL
BUN: 21 mg/dL (ref 6–23)
CO2: 27 mEq/L (ref 19–32)
Calcium: 8.6 mg/dL (ref 8.4–10.5)
Glucose, Bld: 282 mg/dL — ABNORMAL HIGH (ref 70–99)
Sodium: 130 mEq/L — ABNORMAL LOW (ref 135–145)

## 2010-10-25 LAB — DIFFERENTIAL
Basophils Absolute: 0.1 10*3/uL (ref 0.0–0.1)
Basophils Relative: 1 % (ref 0–1)
Eosinophils Absolute: 0.5 10*3/uL (ref 0.0–0.7)
Eosinophils Relative: 6 % — ABNORMAL HIGH (ref 0–5)
Lymphocytes Relative: 15 % (ref 12–46)
Lymphs Abs: 1.1 10*3/uL (ref 0.7–4.0)
Monocytes Absolute: 0.6 10*3/uL (ref 0.1–1.0)
Monocytes Relative: 9 % (ref 3–12)
Neutro Abs: 5 10*3/uL (ref 1.7–7.7)
Neutrophils Relative %: 69 % (ref 43–77)

## 2010-10-25 LAB — CBC
HCT: 33.7 % — ABNORMAL LOW (ref 39.0–52.0)
MCHC: 33.5 g/dL (ref 30.0–36.0)
Platelets: 401 10*3/uL — ABNORMAL HIGH (ref 150–400)
RDW: 18.1 % — ABNORMAL HIGH (ref 11.5–15.5)

## 2010-10-25 LAB — POCT CARDIAC MARKERS: Myoglobin, poc: 238 ng/mL (ref 12–200)

## 2010-10-26 ENCOUNTER — Ambulatory Visit: Payer: Medicare Other | Admitting: Vascular Surgery

## 2010-10-26 ENCOUNTER — Emergency Department (HOSPITAL_COMMUNITY): Payer: Medicare Other

## 2010-10-26 ENCOUNTER — Inpatient Hospital Stay (HOSPITAL_COMMUNITY): Payer: Medicare Other

## 2010-10-26 LAB — BASIC METABOLIC PANEL
BUN: 26 mg/dL — ABNORMAL HIGH (ref 6–23)
Chloride: 99 mEq/L (ref 96–112)
GFR calc non Af Amer: 9 mL/min — ABNORMAL LOW (ref >60)
Glucose, Bld: 234 mg/dL — ABNORMAL HIGH (ref 70–99)
Potassium: 3.9 mEq/L (ref 3.5–5.1)

## 2010-10-26 LAB — GLUCOSE, CAPILLARY

## 2010-10-27 ENCOUNTER — Inpatient Hospital Stay (HOSPITAL_COMMUNITY): Payer: Medicare Other

## 2010-10-27 DIAGNOSIS — G459 Transient cerebral ischemic attack, unspecified: Secondary | ICD-10-CM

## 2010-10-27 DIAGNOSIS — R269 Unspecified abnormalities of gait and mobility: Secondary | ICD-10-CM

## 2010-10-27 DIAGNOSIS — I6330 Cerebral infarction due to thrombosis of unspecified cerebral artery: Secondary | ICD-10-CM

## 2010-10-27 LAB — VITAMIN B12: Vitamin B-12: 403 pg/mL (ref 211–911)

## 2010-10-27 LAB — CBC
HCT: 34 % — ABNORMAL LOW (ref 39.0–52.0)
MCV: 85.4 fL (ref 78.0–100.0)
RBC: 3.98 MIL/uL — ABNORMAL LOW (ref 4.22–5.81)
WBC: 8.7 10*3/uL (ref 4.0–10.5)

## 2010-10-27 LAB — BASIC METABOLIC PANEL
BUN: 38 mg/dL — ABNORMAL HIGH (ref 6–23)
Chloride: 97 mEq/L (ref 96–112)
Glucose, Bld: 135 mg/dL — ABNORMAL HIGH (ref 70–99)
Potassium: 3.9 mEq/L (ref 3.5–5.1)

## 2010-10-27 LAB — SEDIMENTATION RATE: Sed Rate: 37 mm/hr — ABNORMAL HIGH (ref 0–16)

## 2010-10-27 LAB — GLUCOSE, CAPILLARY
Glucose-Capillary: 208 mg/dL — ABNORMAL HIGH (ref 70–99)
Glucose-Capillary: 94 mg/dL (ref 70–99)

## 2010-10-27 LAB — PHOSPHORUS: Phosphorus: 2.5 mg/dL (ref 2.3–4.6)

## 2010-10-27 NOTE — Consult Note (Signed)
Shawn [name], Shawn [name]        ACCOUNT NO.:  [id number]  MEDICAL RECORD NO.:  [id number]           PATIENT TYPE:  LOCATION:                                 FACILITY:  PHYSICIAN:  Aldean Ast, MD       DATE OF BIRTH:  Dec 06, 1960  DATE OF CONSULTATION:  10/26/2010 DATE OF DISCHARGE:                                CONSULTATION   REFERRING PHYSICIAN:  Hospitalist Team.  REASON FOR CONSULTATION:  Stroke.  CHIEF COMPLAINT:  Sudden tingling and numbness in arms with dizziness and balance problem.  HISTORY OF PRESENT ILLNESS:  This is a 50 year old African American man with multiple cardiovascular comorbidities including end-stage renal disease who went to hemodialysis yesterday and found that he was having problem walking because of the balance issues.  He was also complaining of the headache and dizziness feeling, although he did not have a fall, but he felt like he is going to have a fall with balance problem.  He also states to have some tingling and numbness feeling of his both hands.  The patient denies any other additional neurological deficits and majority of the deficits that he had yesterday have already been resolved.  A CT scan was performed in the ER that did not show any abnormalities.  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  Neurology consult was called in.  PAST MEDICAL HISTORY: 1. End-stage renal disease, dialysis three times a week. 2. 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 3. Hypertension. 4. Inguinal hernia. 5. Iron deficiency anemia. 6. Multiple fistula placement and previously placed fistulas have     clogged and this patient as well currently has left internal     jugular permanent catheter.  CURRENT LIST OF MEDICATION IN THE HOSPITAL:  He is taking multiple insulin regimens with lisinopril 10 mg daily, renal vitamins, Crestor 20 mg at bedtime, meclizine p.r.n. basis, and Zofran on p.r.n. basis.   He is on lanthanum carbonate three times a day.  He is on albuterol 2.5 mg inhalation q.6 hourly.  ALLERGIES:  He does not have any drug allergies.  FAMILY HISTORY:  Multiple family members with diabetes mellitus type 2.  SOCIAL HISTORY:  Denies smoking cigarettes, drinking alcohol, or using illicit drugs.  REVIEW OF SYSTEMS:  The patient denies any discomfort currently.  At this point, he does have significantly diabetic retinopathy causing him to have advanced degree of vision problem.  He denies any problem with hearing.  Denies any problem with changes in his vision as compared to his previous one.  No issues with the swallowing.  No chest pain.  No cough.  No phlegm.  No shortness of breath.  No palpitations.  No abdominal pain.  No discomfort.  Denies any black stools.  Denies any burning urination.  Denies any tingling any more in the limbs as he had before admission.  Denies any weakness of his limb musculature as well. Denies any recent weight gain or weight loss.  Denies any recent fevers. Ten-organ review of systems has been negative except those mentioned above.  REVIEW OF CLINICAL DATA:  I have reviewed the  images of his MRI and have noted the body of the corpus callosum infarct with chronic microvascular ischemic changes.  I have reviewed his lab work and have noted HbA1c 6.8.  Multiple increased values of blood glucose with increased creatinine representing his renal failure.  IMPRESSION:  This patient is a 50 year old African American man with multiple cardiovascular comorbidities who had sudden onset of balance problem with both arm paresthesias and his MRI shows carpus callosal ischemic infarct.  My impression is that infarction is because of the small-vessel thrombotic occlusion.  The symptoms have mostly resolved. Symptoms are mostly resolved since onset.  PLAN: 1. Please start him on aspirin 325 mg daily basis. 2. Please send him for cardiac echo or  Doppler carotid, although the     picture on the infarct on the MRI suggest more like thrombotic     reason. 3. He has already been on Crestor for his lipid control. 4. I have discussed the impression in detail with the patient and wife     for compliance of medication.  He seems to understand that.          ______________________________ Aldean Ast, MD     WS/MEDQ  D:  10/26/2010  T:  10/27/2010  Job:  [id number]  Electronically Signed by Aldean Ast MD on 10/27/2010 01:34:34 PM

## 2010-10-28 LAB — GLUCOSE, CAPILLARY
Glucose-Capillary: 124 mg/dL — ABNORMAL HIGH (ref 70–99)
Glucose-Capillary: 144 mg/dL — ABNORMAL HIGH (ref 70–99)
Glucose-Capillary: 91 mg/dL (ref 70–99)

## 2010-10-28 LAB — CBC
Hemoglobin: 10.5 g/dL — ABNORMAL LOW (ref 13.0–17.0)
Platelets: 377 10*3/uL (ref 150–400)
RBC: 3.66 MIL/uL — ABNORMAL LOW (ref 4.22–5.81)
WBC: 7.3 10*3/uL (ref 4.0–10.5)

## 2010-10-28 LAB — HIGH SENSITIVITY CRP: CRP, High Sensitivity: 5 mg/L — ABNORMAL HIGH

## 2010-10-28 LAB — BASIC METABOLIC PANEL
CO2: 28 mEq/L (ref 19–32)
Calcium: 8.7 mg/dL (ref 8.4–10.5)
Chloride: 99 mEq/L (ref 96–112)
GFR calc Af Amer: 16 mL/min — ABNORMAL LOW (ref >60)
Sodium: 136 mEq/L (ref 135–145)

## 2010-10-28 LAB — HOMOCYSTEINE: Homocysteine: 19.7 umol/L — ABNORMAL HIGH (ref 4.0–15.4)

## 2010-10-29 LAB — RENAL FUNCTION PANEL
CO2: 28 mEq/L (ref 19–32)
Calcium: 8.9 mg/dL (ref 8.4–10.5)
Creatinine, Ser: 7.45 mg/dL — ABNORMAL HIGH (ref 0.4–1.5)
Glucose, Bld: 101 mg/dL — ABNORMAL HIGH (ref 70–99)
Phosphorus: 4 mg/dL (ref 2.3–4.6)
Sodium: 134 mEq/L — ABNORMAL LOW (ref 135–145)

## 2010-10-29 LAB — GLUCOSE, CAPILLARY
Glucose-Capillary: 102 mg/dL — ABNORMAL HIGH (ref 70–99)
Glucose-Capillary: 57 mg/dL — ABNORMAL LOW (ref 70–99)
Glucose-Capillary: 91 mg/dL (ref 70–99)

## 2010-10-29 LAB — CBC
HCT: 31.1 % — ABNORMAL LOW (ref 39.0–52.0)
Hemoglobin: 9.9 g/dL — ABNORMAL LOW (ref 13.0–17.0)
MCH: 27.7 pg (ref 26.0–34.0)
MCHC: 31.8 g/dL (ref 30.0–36.0)
RDW: 17.9 % — ABNORMAL HIGH (ref 11.5–15.5)

## 2010-10-30 ENCOUNTER — Inpatient Hospital Stay (HOSPITAL_COMMUNITY): Payer: Medicare Other

## 2010-10-30 LAB — GLUCOSE, CAPILLARY
Glucose-Capillary: 114 mg/dL — ABNORMAL HIGH (ref 70–99)
Glucose-Capillary: 180 mg/dL — ABNORMAL HIGH (ref 70–99)

## 2010-10-30 LAB — ANTITHROMBIN III: AntiThromb III Func: 98 % (ref 76–126)

## 2010-10-30 LAB — LUPUS ANTICOAGULANT PANEL
DRVVT: 45.2 secs — ABNORMAL HIGH (ref 36.2–44.3)
PTT Lupus Anticoagulant: 48.4 secs — ABNORMAL HIGH (ref 30.0–45.6)
dRVVT Incubated 1:1 Mix: 41.3 secs (ref 36.2–44.3)

## 2010-10-30 LAB — BETA-2-GLYCOPROTEIN I ABS, IGG/M/A
Beta-2 Glyco I IgG: 0 G Units (ref <20)
Beta-2-Glycoprotein I IgM: 13 M Units (ref <20)

## 2010-10-30 LAB — CBC
Hemoglobin: 9.6 g/dL — ABNORMAL LOW (ref 13.0–17.0)
MCH: 28.2 pg (ref 26.0–34.0)
MCHC: 32.7 g/dL (ref 30.0–36.0)
Platelets: 417 10*3/uL — ABNORMAL HIGH (ref 150–400)
RDW: 17.9 % — ABNORMAL HIGH (ref 11.5–15.5)

## 2010-10-30 LAB — BASIC METABOLIC PANEL
CO2: 27 mEq/L (ref 19–32)
Calcium: 9 mg/dL (ref 8.4–10.5)
Creatinine, Ser: 9.36 mg/dL — ABNORMAL HIGH (ref 0.4–1.5)
GFR calc Af Amer: 7 mL/min — ABNORMAL LOW (ref >60)
GFR calc non Af Amer: 6 mL/min — ABNORMAL LOW (ref >60)
Sodium: 133 mEq/L — ABNORMAL LOW (ref 135–145)

## 2010-10-30 LAB — PROTEIN C ACTIVITY: Protein C Activity: 117 % (ref 75–133)

## 2010-10-30 LAB — CARDIOLIPIN ANTIBODIES, IGG, IGM, IGA
Anticardiolipin IgA: 6 APL U/mL — ABNORMAL LOW (ref <22)
Anticardiolipin IgG: 8 GPL U/mL — ABNORMAL LOW (ref <23)

## 2010-10-30 LAB — PHOSPHORUS: Phosphorus: 3.6 mg/dL (ref 2.3–4.6)

## 2010-10-31 NOTE — Discharge Summary (Signed)
NAME:  Shawn [name], Shawn [name]        ACCOUNT NO.:  [id number]  MEDICAL RECORD NO.:  [id number]           PATIENT TYPE:  I  LOCATION:  [id number]                         FACILITY:  [facility]  PHYSICIAN:  Estill Cotta, MD       DATE OF BIRTH:  06-Feb-1961  DATE OF ADMISSION:  10/25/2010 DATE OF DISCHARGE:  10/30/2010                        DISCHARGE SUMMARY - REFERRING   PRIMARY CARE PHYSICIAN:  Ernestene Kiel, MD  DISCHARGE DIAGNOSES: 1. 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 2. Gait instability secondary to the acute cerebrovascular accident,     improving. 3. End-stage renal disease, on dialysis Monday, Wednesday and Friday.     Nephrologist, Dr. Moshe Cipro. 4. Diabetes mellitus type 2. 5. Hypertension, uncontrolled. 6. History of diabetic retinopathy. 7. History of iron-deficiency anemia.  DISCHARGE MEDICATIONS: 1. Aspirin 325 mg p.o. daily. 2. Aranesp 12.5 mcg intravenous Friday, on hemodialysis. 3. Ferric complex 125 mg intravenous Friday, on hemodialysis. 4. Atorvastatin 40 mg p.o. q.p.m. 5. Fosrenol 1000 mg 2 tablets p.o. t.i.d. 6. Lisinopril 10 mg p.o. daily at bedtime. 7. Please also add Norvasc 5 mg daily. 8. Nephro-Vite renal vitamin 1 tablet p.o. daily. 9. NovoLog mix 70/30 15 units subcu b.i.d. 10.Vitamin B complex 1 tablet p.o. daily.  BRIEF HISTORY OF PRESENT ILLNESS:  At the time of admission, Shawn [name] is a 50 year old African American male with history of end- stage renal disease, diabetes type 2, hypertension, history of diabetic retinopathy had a dialysis a day before the admission when he presented with 1 day history of dizziness and ataxia.  In the emergency room, head CT was negative.  The patient was admitted for further workup to rule out stroke.  RADIOLOGICAL DATA:  CT head without contrast; 1. No acute intracranial abnormality. 2. Mild bilateral carotid siphon atherosclerosis. 3. Mild chronic right maxillary  sinusitis.  Chest x-ray portable October 26, 2010, no evidence of active pulmonary disease, dialysis catheter tip in upper SVC.  MRI of the brain without contrast October 26, 2010 showed acute infarct in the body of the corpus callosum, mild chronic microvascular ischemia.  MRA of the head one antegrade flow in the carotid and vertebral arteries with no hemodynamically significant stenosis evident, dominant right vertebral artery.  MRA of the head showed one left greater than right ICA siphon atherosclerosis with moderate stenosis and the left supraglenoid segment.  No intracranial major branch occlusion or other significant focal stenosis.  A 2-D echocardiogram on October 27, 2010 showed EF of 50%- [id number], grade 2 diastolic dysfunction.  HOSPITAL COURSE:  Mr. Martensen is a 50 year old male with history of diabetes, hypertension, uncontrolled anemia presented with both arm paresthesias and sudden onset of balance problems. 1. Acute small vessel thrombotic occlusion/CVA.  The patient was     admitted to the medical service.  Stroke workup was initiated given     the sudden onset of his symptoms.  MRI came back positive as acute     CVA in the body of corpus callosum.  The patient underwent stroke     workup and 2-D echo showed EF of 50%-65%.  The patient was started     on aspirin full  dose.  He was on statins.  The patient was     counseled strongly on compliance with aspirin and was explained the     stroke risk as well.  He will follow up with stroke service as an     outpatient in next 3-4 weeks.  The patient was also evaluated very     closely by inpatient rehab CIR consultation.  The patient did     remarkably well through the hospitalization and has been able to     ambulate well with walker.  From the neuro rehab standpoint, the     patient at this time will go home with home physical therapy set up     for him. 2. Hypertension, blood pressure was somewhat uncontrolled.  The     patient  was continued on low-dose lisinopril with the holding     parameters and Norvasc was added at the time of discharge.  The     patient will be discharged home today.  PHYSICAL EXAMINATION:  VITAL SIGNS:  At time of discharge temperature 97.8, pulse 96, respirations 20, blood pressure 150/60, O2 sats 99% on room air. GENERAL:  The patient is alert, awake and oriented, not on any acute distress. HEENT:  Anicteric sclerae.  Pink conjunctivae.  Pupils reactive to light and accommodation.  EOMI. NECK:  Supple.  No lymphopathy.  No JVD. CVS:  S1, S2, clear. CHEST:  Clear to auscultation bilaterally. ABDOMEN:  Soft, nontender, nondistended.  Normal bowel sounds. EXTREMITIES:  No cyanosis, clubbing or edema noted in upper or lower extremities bilaterally.  Discharge follow-up with Dr. Leonie Man, Stroke Service in next 3-4 weeks, Dr. Vincente Liberty within next 2-3 weeks.  DISCHARGE TIME:  35 minutes.     Estill Cotta, MD     RR/MEDQ  D:  10/30/2010  T:  10/30/2010  Job:  [id number]  cc:   Louis Meckel, M.D. Ernestene Kiel, M.D. Pramod P. Leonie Man, MD Aldean Ast, MD  Electronically Signed by Nira Conn RAI  on 10/31/2010 05:36:23 PM

## 2010-11-01 LAB — FACTOR 5 LEIDEN

## 2010-11-16 ENCOUNTER — Ambulatory Visit (INDEPENDENT_AMBULATORY_CARE_PROVIDER_SITE_OTHER): Payer: Medicare Other | Admitting: Vascular Surgery

## 2010-11-16 DIAGNOSIS — N186 End stage renal disease: Secondary | ICD-10-CM

## 2010-11-17 NOTE — Assessment & Plan Note (Signed)
OFFICE VISIT  [name], [name] DOB:  1960/08/29                                       11/16/2010 [id number]  The patient returns for followup today.  He was previously scheduled to have a right arm AV graft on April 24.  However, he had a stroke at that time.  His procedure was cancelled.  He returns today for further followup and to consider rescheduling placement of graft.  He is currently dialyzing via left-sided catheter.  The patient states that his stroke symptoms consisted primarily of numbness in both arms which is now resolved.  He also had some balance issues.  He states the balance has improved but he is still having some difficulty walking...absent radial pulse.  Skin has no open ulcers or rashes.  The patient currently dialyzes on Monday, Wednesday and Friday.  We will schedule him for a right arm AV graft on 11/28/2010 as the patient requested a few more weeks to recover from his stroke before placing a graft.  He understands the risks, benefits, possible complications and procedure details including but not limited to graft thrombosis, ischemic steal, wound problems.    Jessy Oto. Fields, MD Electronically Signed  CEF/MEDQ  D:  11/16/2010  T:  11/17/2010  Job:  [id number]  cc:   [facility]

## 2010-11-21 NOTE — Assessment & Plan Note (Signed)
OFFICE VISIT   ORIEL, KLAUS  DOB:  [birth year]                                       03/01/2010  [id number]   The patient had placement of a left forearm AV fistula on January 30, 2010.  He comes in for a 64-month followup visit.  He also had a catheter placed  at that time.  He has no specific complaints referable to his fistula.  He has had no significant pain.  Overall he has been doing well.   PHYSICAL EXAMINATION:  Blood pressure is 177/114, heart rate is 105,  temperature is 97.5.  His incision in the left wrist has healed nicely.  His catheter site looks fine.  He has a good thrill in his AV fistula.  He does have 1 competing branch but appears to have fairly good flow  above this level.   Hopefully, the fistula will continue to mature nicely and will be used  for access in the next 6-8 weeks.  If it is slow to mature,  consideration to beginning to ligating his competing branch, however  currently does not appear to be creating a significant diversion of  flow.  We will see him back p.r.n.     Judeth Cornfield. Scot Dock, M.D.  Electronically Signed   CSD/MEDQ  D:  03/01/2010  T:  03/02/2010  Job:  [id number]   cc:   [facility]

## 2010-11-28 ENCOUNTER — Ambulatory Visit (HOSPITAL_COMMUNITY)
Admission: RE | Admit: 2010-11-28 | Discharge: 2010-11-28 | Disposition: A | Discharge status: Home / Self Care | Payer: Medicare Other | Source: Ambulatory Visit | Attending: Vascular Surgery | Admitting: Vascular Surgery

## 2010-11-28 DIAGNOSIS — I120 Hypertensive chronic kidney disease with stage 5 chronic kidney disease or end stage renal disease: Secondary | ICD-10-CM | POA: Insufficient documentation

## 2010-11-28 DIAGNOSIS — Z8673 Personal history of transient ischemic attack (TIA), and cerebral infarction without residual deficits: Secondary | ICD-10-CM | POA: Insufficient documentation

## 2010-11-28 DIAGNOSIS — Z794 Long term (current) use of insulin: Secondary | ICD-10-CM | POA: Insufficient documentation

## 2010-11-28 DIAGNOSIS — N186 End stage renal disease: Secondary | ICD-10-CM | POA: Insufficient documentation

## 2010-11-28 DIAGNOSIS — E119 Type 2 diabetes mellitus without complications: Secondary | ICD-10-CM | POA: Insufficient documentation

## 2010-11-28 DIAGNOSIS — Z992 Dependence on renal dialysis: Secondary | ICD-10-CM | POA: Insufficient documentation

## 2010-11-28 DIAGNOSIS — Z01812 Encounter for preprocedural laboratory examination: Secondary | ICD-10-CM | POA: Insufficient documentation

## 2010-11-28 LAB — POCT I-STAT 4, (NA,K, GLUC, HGB,HCT)
Glucose, Bld: 88 mg/dL (ref 70–99)
HCT: 47 % (ref 39.0–52.0)
Hemoglobin: 16 g/dL (ref 13.0–17.0)
Sodium: 135 mEq/L (ref 135–145)

## 2010-11-28 LAB — GLUCOSE, CAPILLARY
Glucose-Capillary: 114 mg/dL — ABNORMAL HIGH (ref 70–99)
Glucose-Capillary: 84 mg/dL (ref 70–99)

## 2010-12-14 ENCOUNTER — Ambulatory Visit (HOSPITAL_COMMUNITY)
Admission: RE | Admit: 2010-12-14 | Discharge: 2010-12-14 | Disposition: A | Discharge status: Home / Self Care | Payer: Medicare Other | Source: Ambulatory Visit | Attending: Vascular Surgery | Admitting: Vascular Surgery

## 2010-12-14 DIAGNOSIS — N186 End stage renal disease: Secondary | ICD-10-CM | POA: Insufficient documentation

## 2010-12-14 DIAGNOSIS — T82898A Other specified complication of vascular prosthetic devices, implants and grafts, initial encounter: Secondary | ICD-10-CM | POA: Insufficient documentation

## 2010-12-14 DIAGNOSIS — I120 Hypertensive chronic kidney disease with stage 5 chronic kidney disease or end stage renal disease: Secondary | ICD-10-CM

## 2010-12-14 DIAGNOSIS — Z8673 Personal history of transient ischemic attack (TIA), and cerebral infarction without residual deficits: Secondary | ICD-10-CM | POA: Insufficient documentation

## 2010-12-14 DIAGNOSIS — Y832 Surgical operation with anastomosis, bypass or graft as the cause of abnormal reaction of the patient, or of later complication, without mention of misadventure at the time of the procedure: Secondary | ICD-10-CM | POA: Insufficient documentation

## 2010-12-14 DIAGNOSIS — Z992 Dependence on renal dialysis: Secondary | ICD-10-CM | POA: Insufficient documentation

## 2010-12-14 DIAGNOSIS — E119 Type 2 diabetes mellitus without complications: Secondary | ICD-10-CM | POA: Insufficient documentation

## 2010-12-14 LAB — POCT I-STAT 4, (NA,K, GLUC, HGB,HCT)
Glucose, Bld: 82 mg/dL (ref 70–99)
HCT: 40 % (ref 39.0–52.0)
Potassium: 3.4 mEq/L — ABNORMAL LOW (ref 3.5–5.1)

## 2010-12-15 ENCOUNTER — Emergency Department (HOSPITAL_COMMUNITY)
Admission: EM | Admit: 2010-12-15 | Discharge: 2010-12-15 | Disposition: A | Discharge status: Home / Self Care | Payer: Medicare Other | Attending: Emergency Medicine | Admitting: Emergency Medicine

## 2010-12-15 DIAGNOSIS — M79609 Pain in unspecified limb: Secondary | ICD-10-CM | POA: Insufficient documentation

## 2010-12-15 DIAGNOSIS — R209 Unspecified disturbances of skin sensation: Secondary | ICD-10-CM | POA: Insufficient documentation

## 2010-12-15 NOTE — Op Note (Signed)
NAME:  Shawn [name], Shawn [name]        ACCOUNT NO.:  [id number]  MEDICAL RECORD NO.:  [id number]           PATIENT TYPE:  O  LOCATION:  SDSC                         FACILITY:  [facility]  PHYSICIAN:  Charles E. Fields, MD  DATE OF BIRTH:  [birth year]  DATE OF PROCEDURE:  11/28/2010 DATE OF DISCHARGE:  11/28/2010                              OPERATIVE REPORT   PROCEDURE:  Right upper arm arteriovenous graft.  PREOPERATIVE DIAGNOSIS:  End-stage renal disease.  POSTOPERATIVE DIAGNOSIS:  End-stage renal disease.  ANESTHESIA:  Local with IV sedation.  OPERATIVE FINDINGS:  4-7 mm tapered PTFE graft.  ASSISTANT:  Leta Baptist, PA-C.  OPERATIVE DETAILS:  After obtaining informed consent, the patient was taken to the operating room.  The patient was placed in supine position on the operating table.  After adequate sedation, the patient's entire right upper extremity was prepped and draped in the usual sterile fashion.  Next, local anesthesia was infiltrated near the antecubital crease.  A longitudinal incision was made in this location and carried down through the subcutaneous tissues down the level of a preexisting right brachiocephalic AV fistula which had occluded.  This was ligated and divided at its origin.  Preprocedure, vein mapping had suggested that the basilic vein might be a reasonable quality as an outflow vein. However, the vein was quite diminutive and was sclerotic and had been damaged from scar from the previous fistula operation.  It was decided at this point that an upper arm graft would need to be placed.  The brachial artery was dissected free circumferentially and vessel loop was placed around this.  Next, local anesthesia was infiltrated up in the right axilla.  A longitudinal incision was made in this location and carried down through the subcutaneous tissues down to the level of the right axillary vein.  This was of good quality, approximately 5 mm in diameter.   This was dissected free circumferentially and vessel loop was placed around this.  A subcutaneous tunnel was then created checking the lower arm incision to the axillary incision and a 4-7 mm tapered PTFE graft brought through this subcutaneous tunnel.  The patient was then given 5000 units of intravenous heparin.  Vessel loops were used to control the artery proximally and distally.  A longitudinal opening was made in the artery.  The 4-mm end of the graft was slightly beveled and sewn end graft to side of artery using a running 6-0 Prolene suture. Just prior to completion of anastomosis, this was forebled, backbled, and thoroughly flushed.  Anastomosis was secured.  Clamps were released. There was pulsatile flow in the graft immediately.  Graft was then pulled taut to length.  The distal axillary vein was ligated with a 2-0 silk tie.  The vein was controlled proximally with a fine bulldog clamp. The vein was transected, spatulated, and pulled up to the level of the graft.  The graft was cut to length and beveled and sewn end to end to the vein using a running 6-0 Prolene suture.  Just prior to completion of anastomosis, this was forebled, backbled, and thoroughly flushed. Anastomosis was secured.  Clamps were released.  There was palpable thrill  in the graft immediately.  Hemostasis was obtained.  Subcutaneous tissues of both incisions were reapproximated using running 3-0 Vicryl suture.  Skin of both incisions was closed with a 4-0 Vicryl subcuticular stitch.  The patient tolerated the procedure well and there were no complications.  Instrument, sponge, and needle counts were correct at the end of the case.  The patient was taken to the recovery room in stable condition.     Jessy Oto. Fields, MD     CEF/MEDQ  D:  12/01/2010  T:  12/01/2010  Job:  [id number]  Electronically Signed by Ruta Hinds MD on 12/15/2010 09:23:44 AM

## 2010-12-21 NOTE — H&P (Signed)
NAME:  Shawn [name], Shawn [name]        ACCOUNT NO.:  [id number]  MEDICAL RECORD NO.:  [id number]           PATIENT TYPE:  LOCATION:                                 FACILITY:  PHYSICIAN:  Laurence Compton, MD    DATE OF BIRTH:  [birth year]  DATE OF ADMISSION: DATE OF DISCHARGE:                             HISTORY & PHYSICAL   REASON FOR HOSPITAL VISIT:  Dizziness, tingling, numbness in arms.  HISTORY OF PRESENT ILLNESS:  This is a 50 year old African American gentleman with past medical and surgical history of: 1. ESRD.  The patient on Monday, Wednesday, Friday dialysis.  Last     dialysis on October 25, 2010, by Dr. Moshe Cipro.  Access is in the     left IJ, has couple of clogged fistulas. 2. Diabetes mellitus type 2, the patient on insulin. 3. Hypertension. 4. History of right inguinal hernia. 5. History of diabetic retinopathy. 6. History of iron deficiency anemia.  The patient who had dialysis yesterday comes in with a 1-day history of dizziness and ataxia which he describes as a sensation of things moving above his head and when he sits up he tends to lean towards his left side.  He has the same feeling while walking.  He has not incurred any falls, but he feels that his balance is extremely poor.  He also states that he had been having a mild generalized headache on and off basis for one day, along with it he also complains of some tingling and numbness in both arms on and off.  At this time, he is lying in the bed and is completely symptom free.  His headache has resolved.  He has no tingling or numbness in this arms, and once he is lying flat his sensation of ataxia/vertigo is gone.  In the ER, the patient is not orthostatic.  I checked his orthostatics personally.  His head CT is negative.  Initial lab work is unremarkable.  In my interview, the patient currently is headache free.  His baseline vision is poor.  He attributes that to his history of diabetes mellitus and  diabetic retinopathy.  No change in that pattern.  No problems with hearing.  He has no ringing in his ears.  No history suggestive of Meniere disease, etc.  Denies any neck stiffness.  Denies any photophobia.  Denies any problems swallowing food or liquids.  Denies any chest pain, cough, phlegm, shortness of breath.  No palpitations. No abdominal pain or discomfort.  No blood in stool or urine.  No new weakness, tingling, numbness in any extremity except as dictated above. His strength seems to be at his baseline, although he feels weak once he stands up, this is a generalized all-over-the-body sensation.  Denies any new mental stressors.  Denies any recent weight gain or weight loss.  Ten-point review of systems was obtained except as dictated above.  All other review of systems negative.  PAST MEDICAL:  As above.  SURGICAL HISTORY:  Multiple fistula placements and previously placed fistulas have clogged.  The patient currently has a left IJ PermCath.  HOME MEDICATIONS:  The patient's home medication list is not final. This  is through his previous chart review.  The patient takes: 1. Lipitor 40 mg p.o. daily. 2. Lovenox 70/30, 15 units subcu b.i.d. 3. Fosrenol 1000 mg p.o. t.i.d. with meals. 4. Nephro 237 mL one p.o. daily. 5. Nephro-Vite 1 pill p.o. daily. 6. Lisinopril 10 mg p.o. daily.  He has no known drug allergies.  PHYSICAL EXAMINATION:  VITAL SIGNS:  Temperature 97.2, pulse 90, respiration 22, blood pressure 131/67.  He is not orthostatic, 98% on room air. GENERAL:  Middle-aged, moderately built African American gentleman lying in hospital bed in no apparent distress. HEENT:  Normocephalic, atraumatic head.  Pupils equal and reactive to light.  Pink and moist tongue and throat.  No scleral icterus.  No conjunctival injection. NECK:  Supple neck.  No JVD. PSYCHIATRIC:  Insight is intact.  He is alert, awake, oriented x3.  Not suicidal or homicidal. CNS:  All cranial  nerves intact.  No focal logical deficits.  Strength 5/5 in all four extremities.  Sensation intact in all four extremities. Downgoing plantar.  He has no nystagmus.  He did demonstrate left-sided fall while he was sitting up.  He kind of slumped in the bed towards his left side.  He was too afraid to demonstrate his gait.  He said he felt like he is going to fall down. CHEST:  Chest wall movement bilaterally symmetrical.  Good air movement bilaterally.  No rhonchi.  No wheezes. CVS:  Regular rate rhythm.  Normal S1 and S2.  No gallops, rubs, or murmurs. ABDOMEN:  Soft.  Positive bowel sounds, nontender. EXTREMITIES:  No cyanosis, clubbing, edema.  His lab work is as follows:  White count 7.2, hemoglobin 11.3, hematocrit 33.7, platelets 401.  Troponin is less than 0.05, CK-MB less than 1.  Sodium 130, potassium 3.5, chloride 96, bicarb 27, glucose 282, BUN 21, creatinine 5.4.  CT of the brain is unremarkable with no acute changes.  EKG done in the ER shows normal sinus rhythm, rate 92 beats per minute, no acute ST-T changes, QTc 470 msec.  ASSESSMENT AND PLAN: 1. Vertigo/ataxia, questionable benign positional vertigo versus     cerebellar cerebrovascular accident.  Head CT is unremarkable.  We     will admit the patient on a teddy bed.  We will obtain MRI     noncontrast as he is a dialysis patient with ESRD.  Physical     therapy to evaluate for balance issues.  We will put him on aspirin     and statin for now.  If the patient turns out to have a stroke,     echocardiogram and carotid duplex scan be considered along with     neurology input. 2. End-stage renal disease.  Dialysis due on November 04, 2010.  Please     call Dr. Moshe Cipro and informed of the same. 3. History of hypertension.  No acute issues.  Blood pressure normal.     We will monitor the patient for orthostatic drop.  We will continue     him on a low-dose lisinopril with holding centimeters. 4. Diabetes mellitus  type 2.  We will check an A1c.  We will continue     the patient's home dose 70/30 with meals.  We will add low-dose     sliding scale with meals only. 5. Low sodium.  We will restrict fluid intake, free water intake to     1.2 L per day.  We will repeat BMP in the     morning. 6.  The patient will get heparin for deep vein thrombosis prophylaxis. 7. He wishes to be full code.          ______________________________ Laurence Compton, MD     PS/MEDQ  D:  10/26/2010  T:  10/26/2010  Job:  [id number]  Electronically Signed by Lala Lund MD on 12/21/2010 11:29:58 AM

## 2010-12-23 NOTE — Op Note (Signed)
NAME:  RODENY, DEUSCHLE        ACCOUNT NO.:  [id number]  MEDICAL RECORD NO.:  [id number]  LOCATION:  SDSC                         FACILITY:  [facility]  PHYSICIAN:  Conrad Lafitte, MD       DATE OF BIRTH:  08/13/1960  DATE OF PROCEDURE: DATE OF DISCHARGE:  12/14/2010                              OPERATIVE REPORT   PROCEDURE:  Thrombectomy of right upper arm arteriovenous graft.  PREOPERATIVE DIAGNOSIS:  Thrombosed right upper arm arteriovenous graft.  POSTOPERATIVE DIAGNOSIS:  Thrombosed right upper arm arteriovenous graft.  SURGEON:  Conrad [place], MD  ASSISTANT:  Leta Baptist, PA-C  ANESTHESIA:  Monitor anesthesia care and local.  FINDINGS:  thrombus extracted from the right upper arm arteriovenous graft.  SPECIMENS:  None.  ESTIMATED BLOOD LOSS:  Minimal.  COMPLICATIONS:  None.  INDICATIONS:  This is a 50 year old gentleman who had a recently placed upper arm arteriovenous graft that he notes lost its thrill in this graft after a drop in his blood pressure during hemodialysis.  He come in about 1 day after thrombosis of that graft.  Based on the story, I felt that there was a good chance that simple thrombectomy could salvage this graft, so we discussed the plan to go back and explored the graft for a simple thrombectomy and revise it if necessary.  He is aware of the risks of this procedure include bleeding, infection, possible infection of the graft, and possible need for additional procedures.  He is aware of this and agreed to proceed forward.  DESCRIPTION OF OPERATION:  After full informed written consent was obtained from the patient, he was brought back to the operating room. He was given IV antibiotics prior to induction.  After obtaining adequate sedation, he was prepped and draped in the standard fashion for right arm access procedure, I turned my attention to the axillary incision.  I took off the previous Dermabond here and then opened the wound with  a 10 blade.  I dissected through the subcutaneous tissue down to level of the graft.  The axillary vein was in continuity with the graft in an end-to-end configuration.  The vein looked to be at least 5-6 mm in diameter and looked to be relatively healthy.  I dissected proximally onto the graft and  made a graftotomy with an 11-blade and then passed #4 Fogarty centrally and was able to extract a significant amount of clot until I got good venous backbleeding.  At this point, I injected heparinized saline here and clamped off the graft.  Please note that I had already given 3000 units of heparin intravenously prior to starting this portion of the case and then turned my attention to the arterial side to this graft.  I passed first a 3 Fogarty distally, and was able to get through the brachial artery without any difficulties.  I did a thrombectomy with 3 Fogarty without any problems extracting the arterial meniscus.  In this process, I repeated this a couple of times, extracting no clot on the last pass and then passed the 4 Fogarty balloon distally through the arterial arm of this graft on the last pass I also extracted no clot.  At this point, I injected some  heparinized saline into this portion of the graft, clamped it off and then repaired the graftotomy with a running stitch of 5-0 Prolene.  The surgical wound was then irrigated out with sterile saline. There was no more active bleeding.  The subcutaneous tissue was reapproximated with a running stitch of 3-0 Vicryl, and the skin was reapproximated with a running stitch of 4-0 Monocryl, then the skin cleaned, dried and then reinforced with Dermabond.  The patient tolerated this procedure well.  The plan is to discharge him home where he can continue his dialysis through his left tunneled dialysis catheter until this graft has fully incorporated into its surrounding tissue.  COMPLICATIONS:  None.  CONDITION:  Stable.     Conrad [place], MD     BLC/MEDQ  D:  12/14/2010  T:  12/15/2010  Job:  [id number]  Electronically Signed by Adele Barthel MD on 12/23/2010 07:36:51 PM

## 2010-12-24 IMAGING — CR DG CHEST 1V PORT
1 series · 1 of 1 positions shown · non-contrast
Comparison: 02/09/2010

CLINICAL DATA: Syncope

PORTABLE CHEST - 1 VIEW

[view not recorded]
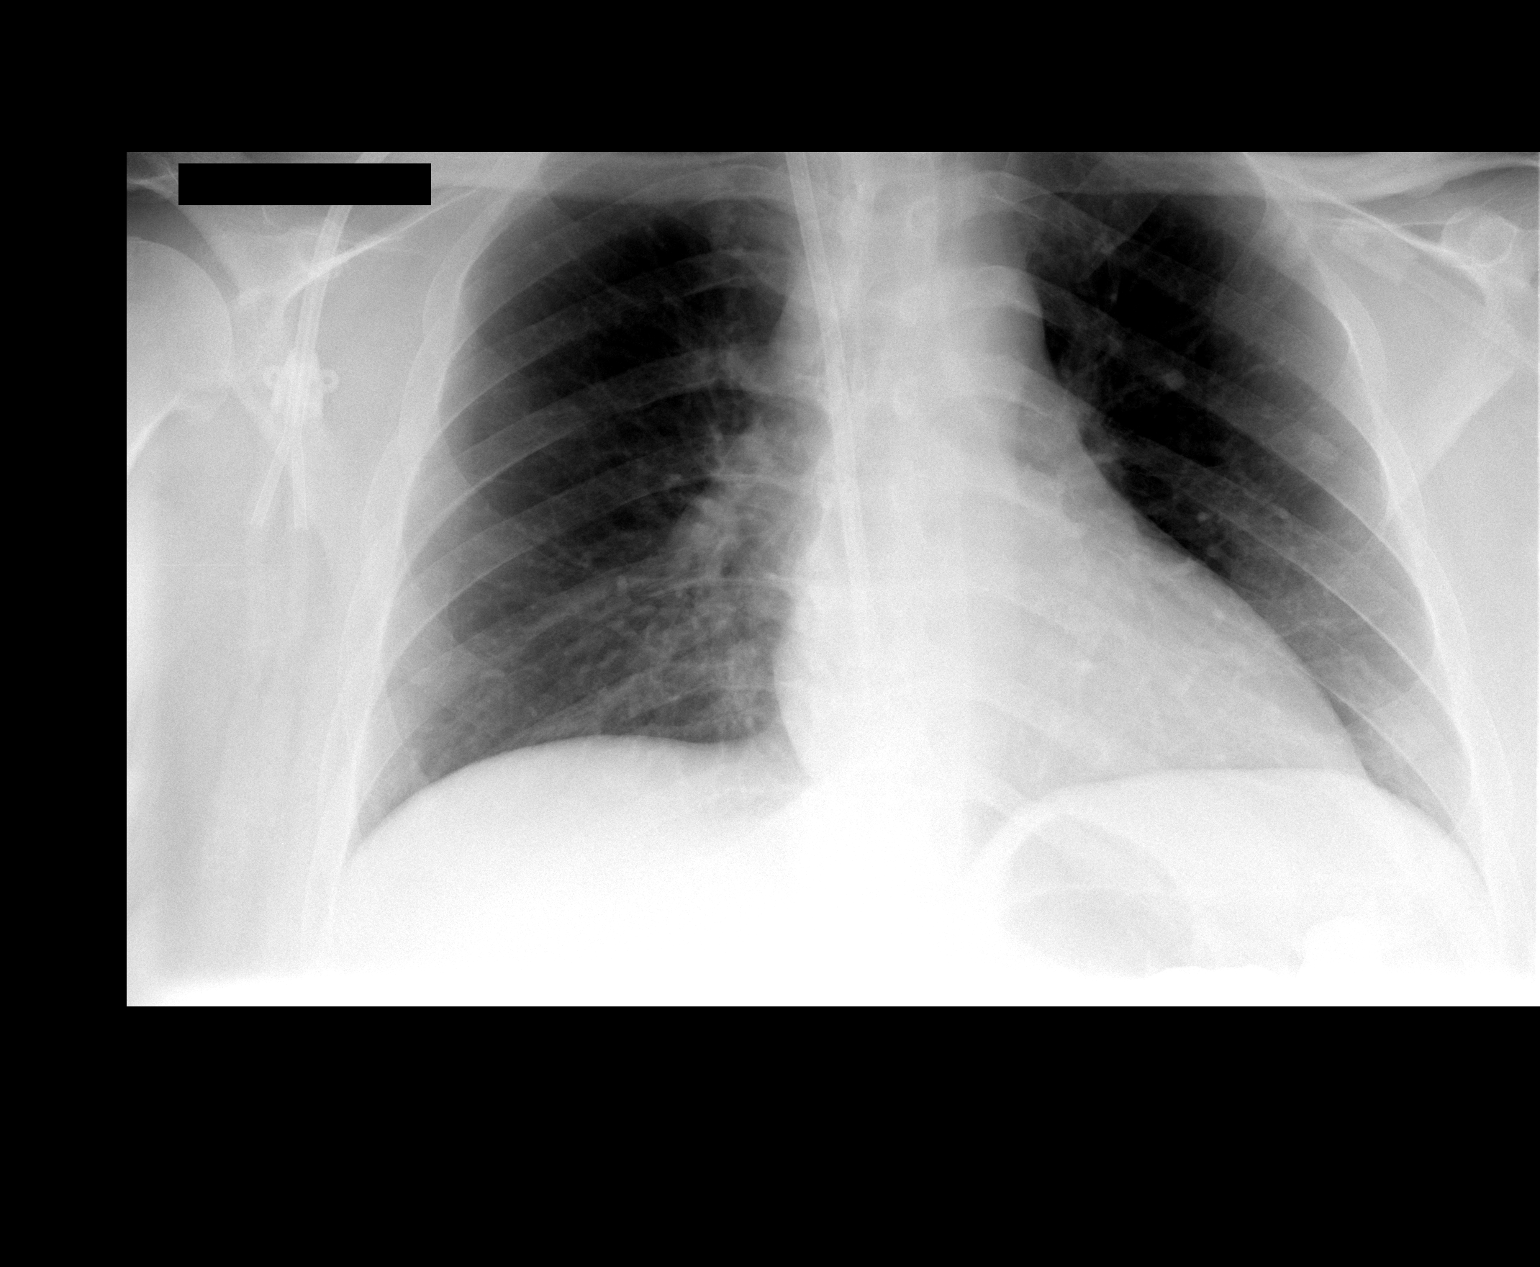

[1 of 1 positions shown; findings below may reference images not displayed]

FINDINGS: Right IJ hemodialysis catheter stable. Lungs clear.
Heart size and pulmonary vascularity normal.  No effusion.
Visualized bones unremarkable.
IMPRESSION: No acute disease

## 2011-01-23 ENCOUNTER — Encounter (HOSPITAL_COMMUNITY): Payer: Medicare Other

## 2011-01-25 ENCOUNTER — Ambulatory Visit (HOSPITAL_COMMUNITY)
Admission: RE | Admit: 2011-01-25 | Discharge: 2011-01-25 | Disposition: A | Discharge status: Home / Self Care | Payer: Medicare Other | Source: Ambulatory Visit | Attending: Vascular Surgery | Admitting: Vascular Surgery

## 2011-01-25 DIAGNOSIS — T82898A Other specified complication of vascular prosthetic devices, implants and grafts, initial encounter: Secondary | ICD-10-CM | POA: Insufficient documentation

## 2011-01-25 DIAGNOSIS — H5440 Blindness, one eye, unspecified eye: Secondary | ICD-10-CM | POA: Insufficient documentation

## 2011-01-25 DIAGNOSIS — I120 Hypertensive chronic kidney disease with stage 5 chronic kidney disease or end stage renal disease: Secondary | ICD-10-CM

## 2011-01-25 DIAGNOSIS — N186 End stage renal disease: Secondary | ICD-10-CM

## 2011-01-25 DIAGNOSIS — Y831 Surgical operation with implant of artificial internal device as the cause of abnormal reaction of the patient, or of later complication, without mention of misadventure at the time of the procedure: Secondary | ICD-10-CM | POA: Insufficient documentation

## 2011-01-25 DIAGNOSIS — E109 Type 1 diabetes mellitus without complications: Secondary | ICD-10-CM | POA: Insufficient documentation

## 2011-01-25 LAB — POCT I-STAT 4, (NA,K, GLUC, HGB,HCT)
Glucose, Bld: 105 mg/dL — ABNORMAL HIGH (ref 70–99)
HCT: 46 % (ref 39.0–52.0)
Hemoglobin: 15.6 g/dL (ref 13.0–17.0)

## 2011-01-25 LAB — GLUCOSE, CAPILLARY: Glucose-Capillary: 111 mg/dL — ABNORMAL HIGH (ref 70–99)

## 2011-01-25 LAB — SURGICAL PCR SCREEN
MRSA, PCR: NEGATIVE
Staphylococcus aureus: NEGATIVE

## 2011-02-08 ENCOUNTER — Ambulatory Visit (HOSPITAL_COMMUNITY): Payer: Medicare Other | Attending: Surgery

## 2011-02-08 DIAGNOSIS — N186 End stage renal disease: Secondary | ICD-10-CM | POA: Insufficient documentation

## 2011-02-08 DIAGNOSIS — Z452 Encounter for adjustment and management of vascular access device: Secondary | ICD-10-CM | POA: Insufficient documentation

## 2011-02-08 DIAGNOSIS — I120 Hypertensive chronic kidney disease with stage 5 chronic kidney disease or end stage renal disease: Secondary | ICD-10-CM

## 2011-02-20 NOTE — Op Note (Signed)
  NAMEANTONE, VILLARUZ        ACCOUNT NO.:  [id number]  MEDICAL RECORD NO.:  [id number]  LOCATION:  [facility]                         FACILITY:  [facility]  PHYSICIAN:  Rosetta Posner, M.D.    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  PROCEDURE:  Thrombectomy, revision of venous anastomosis, right upper arm AV Gore-Tex graft.  SURGEON:  Rosetta Posner, MD.  ASSISTANT:  Leta Baptist, PA.  ANESTHESIA:  LMA.  COMPLICATIONS:  None.  DISPOSITION:  To recovery room, stable.  INDICATIONS FOR PROCEDURE:  The patient is a 50 year old gentleman with end-stage renal disease with occluded right upper arm AV Gore-Tex graft. This graft was placed approximately 2 months ago.  He had an [place] thrombosis of this and was taken back to the operating room and underwent thrombectomy of the graft.  He has had another occlusion and was taken to the operating room at this time for thrombectomy and revision.  He does have a dialysis catheter in his left IJ, which is being used for dialysis currently.  PROCEDURE IN DETAIL:  The patient was taken to the operating room and placed in supine position.  The area of the right upper arm and right axilla were prepped and draped in usual sterile fashion.  The axillary incision was reopened.  The graft was exposed.  The graft was opened through the prior thrombectomy site, which was a transverse incision near the venous anastomosis.  There was mild stenosis in the vein.  The vein was exposed further proximally and was of large caliber.  The graft itself was thrombectomized.  The arterialized plug was removed and good arterial inflow was encountered.  The graft was flushed,  heparinized, and re-occluded.  Next, a new interposition graft of 8-mm Gore-Tex was brought onto the field.  The vein was exposed further proximally.  The old venous anastomosis resected and the vein was spatulated.  The graft was also spatulated and sewn end-to-end to the vein with a running 6-0 Prolene suture.  The graft was flushed, heparinized, and re-occluded. Next, the new interposition graft was sewn end-to-end to the old graft with a running 6-0 Prolene suture.  Clamps removed and excellent thrill was noted.  The wounds were irrigated with saline. Hemostasis was obtained with electrocautery.  Wounds were closed with 3- 0 Vicryl in the subcutaneous and subcuticular tissue.  Benzoin and Steri- Strips were applied.     Rosetta Posner, M.D.     TFE/MEDQ  D:  01/25/2011  T:  01/25/2011  Job:  [id number]  cc:   [facility]  Electronically Signed by Hill Mackie M.D. on 02/20/2011 10:33:58 AM

## 2011-07-11 DIAGNOSIS — Z992 Dependence on renal dialysis: Secondary | ICD-10-CM | POA: Diagnosis not present

## 2011-07-11 DIAGNOSIS — N186 End stage renal disease: Secondary | ICD-10-CM | POA: Diagnosis not present

## 2011-07-11 DIAGNOSIS — N039 Chronic nephritic syndrome with unspecified morphologic changes: Secondary | ICD-10-CM | POA: Diagnosis not present

## 2011-07-11 DIAGNOSIS — D509 Iron deficiency anemia, unspecified: Secondary | ICD-10-CM | POA: Diagnosis not present

## 2011-07-11 DIAGNOSIS — N2581 Secondary hyperparathyroidism of renal origin: Secondary | ICD-10-CM | POA: Diagnosis not present

## 2011-07-11 DIAGNOSIS — D631 Anemia in chronic kidney disease: Secondary | ICD-10-CM | POA: Diagnosis not present

## 2011-07-11 DIAGNOSIS — E119 Type 2 diabetes mellitus without complications: Secondary | ICD-10-CM | POA: Diagnosis not present

## 2011-07-13 DIAGNOSIS — E119 Type 2 diabetes mellitus without complications: Secondary | ICD-10-CM | POA: Diagnosis not present

## 2011-07-13 DIAGNOSIS — N2581 Secondary hyperparathyroidism of renal origin: Secondary | ICD-10-CM | POA: Diagnosis not present

## 2011-07-13 DIAGNOSIS — D631 Anemia in chronic kidney disease: Secondary | ICD-10-CM | POA: Diagnosis not present

## 2011-07-13 DIAGNOSIS — N186 End stage renal disease: Secondary | ICD-10-CM | POA: Diagnosis not present

## 2011-07-13 DIAGNOSIS — Z992 Dependence on renal dialysis: Secondary | ICD-10-CM | POA: Diagnosis not present

## 2011-07-13 DIAGNOSIS — D509 Iron deficiency anemia, unspecified: Secondary | ICD-10-CM | POA: Diagnosis not present

## 2011-07-16 DIAGNOSIS — N2581 Secondary hyperparathyroidism of renal origin: Secondary | ICD-10-CM | POA: Diagnosis not present

## 2011-07-16 DIAGNOSIS — E119 Type 2 diabetes mellitus without complications: Secondary | ICD-10-CM | POA: Diagnosis not present

## 2011-07-16 DIAGNOSIS — D509 Iron deficiency anemia, unspecified: Secondary | ICD-10-CM | POA: Diagnosis not present

## 2011-07-16 DIAGNOSIS — Z992 Dependence on renal dialysis: Secondary | ICD-10-CM | POA: Diagnosis not present

## 2011-07-16 DIAGNOSIS — D631 Anemia in chronic kidney disease: Secondary | ICD-10-CM | POA: Diagnosis not present

## 2011-07-16 DIAGNOSIS — N186 End stage renal disease: Secondary | ICD-10-CM | POA: Diagnosis not present

## 2011-07-18 DIAGNOSIS — Z992 Dependence on renal dialysis: Secondary | ICD-10-CM | POA: Diagnosis not present

## 2011-07-18 DIAGNOSIS — E119 Type 2 diabetes mellitus without complications: Secondary | ICD-10-CM | POA: Diagnosis not present

## 2011-07-18 DIAGNOSIS — N2581 Secondary hyperparathyroidism of renal origin: Secondary | ICD-10-CM | POA: Diagnosis not present

## 2011-07-18 DIAGNOSIS — D631 Anemia in chronic kidney disease: Secondary | ICD-10-CM | POA: Diagnosis not present

## 2011-07-18 DIAGNOSIS — D509 Iron deficiency anemia, unspecified: Secondary | ICD-10-CM | POA: Diagnosis not present

## 2011-07-18 DIAGNOSIS — N186 End stage renal disease: Secondary | ICD-10-CM | POA: Diagnosis not present

## 2011-07-20 DIAGNOSIS — D509 Iron deficiency anemia, unspecified: Secondary | ICD-10-CM | POA: Diagnosis not present

## 2011-07-20 DIAGNOSIS — E119 Type 2 diabetes mellitus without complications: Secondary | ICD-10-CM | POA: Diagnosis not present

## 2011-07-20 DIAGNOSIS — N2581 Secondary hyperparathyroidism of renal origin: Secondary | ICD-10-CM | POA: Diagnosis not present

## 2011-07-20 DIAGNOSIS — N186 End stage renal disease: Secondary | ICD-10-CM | POA: Diagnosis not present

## 2011-07-20 DIAGNOSIS — Z992 Dependence on renal dialysis: Secondary | ICD-10-CM | POA: Diagnosis not present

## 2011-07-20 DIAGNOSIS — N039 Chronic nephritic syndrome with unspecified morphologic changes: Secondary | ICD-10-CM | POA: Diagnosis not present

## 2011-07-23 DIAGNOSIS — D509 Iron deficiency anemia, unspecified: Secondary | ICD-10-CM | POA: Diagnosis not present

## 2011-07-23 DIAGNOSIS — N186 End stage renal disease: Secondary | ICD-10-CM | POA: Diagnosis not present

## 2011-07-23 DIAGNOSIS — D631 Anemia in chronic kidney disease: Secondary | ICD-10-CM | POA: Diagnosis not present

## 2011-07-23 DIAGNOSIS — N2581 Secondary hyperparathyroidism of renal origin: Secondary | ICD-10-CM | POA: Diagnosis not present

## 2011-07-23 DIAGNOSIS — E119 Type 2 diabetes mellitus without complications: Secondary | ICD-10-CM | POA: Diagnosis not present

## 2011-07-23 DIAGNOSIS — Z992 Dependence on renal dialysis: Secondary | ICD-10-CM | POA: Diagnosis not present

## 2011-07-25 DIAGNOSIS — N186 End stage renal disease: Secondary | ICD-10-CM | POA: Diagnosis not present

## 2011-07-25 DIAGNOSIS — E119 Type 2 diabetes mellitus without complications: Secondary | ICD-10-CM | POA: Diagnosis not present

## 2011-07-25 DIAGNOSIS — D509 Iron deficiency anemia, unspecified: Secondary | ICD-10-CM | POA: Diagnosis not present

## 2011-07-25 DIAGNOSIS — Z992 Dependence on renal dialysis: Secondary | ICD-10-CM | POA: Diagnosis not present

## 2011-07-25 DIAGNOSIS — D631 Anemia in chronic kidney disease: Secondary | ICD-10-CM | POA: Diagnosis not present

## 2011-07-25 DIAGNOSIS — N2581 Secondary hyperparathyroidism of renal origin: Secondary | ICD-10-CM | POA: Diagnosis not present

## 2011-07-27 DIAGNOSIS — D509 Iron deficiency anemia, unspecified: Secondary | ICD-10-CM | POA: Diagnosis not present

## 2011-07-27 DIAGNOSIS — N2581 Secondary hyperparathyroidism of renal origin: Secondary | ICD-10-CM | POA: Diagnosis not present

## 2011-07-27 DIAGNOSIS — N186 End stage renal disease: Secondary | ICD-10-CM | POA: Diagnosis not present

## 2011-07-27 DIAGNOSIS — D631 Anemia in chronic kidney disease: Secondary | ICD-10-CM | POA: Diagnosis not present

## 2011-07-27 DIAGNOSIS — Z992 Dependence on renal dialysis: Secondary | ICD-10-CM | POA: Diagnosis not present

## 2011-07-27 DIAGNOSIS — E119 Type 2 diabetes mellitus without complications: Secondary | ICD-10-CM | POA: Diagnosis not present

## 2011-07-30 DIAGNOSIS — E119 Type 2 diabetes mellitus without complications: Secondary | ICD-10-CM | POA: Diagnosis not present

## 2011-07-30 DIAGNOSIS — N186 End stage renal disease: Secondary | ICD-10-CM | POA: Diagnosis not present

## 2011-07-30 DIAGNOSIS — N039 Chronic nephritic syndrome with unspecified morphologic changes: Secondary | ICD-10-CM | POA: Diagnosis not present

## 2011-07-30 DIAGNOSIS — Z992 Dependence on renal dialysis: Secondary | ICD-10-CM | POA: Diagnosis not present

## 2011-07-30 DIAGNOSIS — D509 Iron deficiency anemia, unspecified: Secondary | ICD-10-CM | POA: Diagnosis not present

## 2011-07-30 DIAGNOSIS — N2581 Secondary hyperparathyroidism of renal origin: Secondary | ICD-10-CM | POA: Diagnosis not present

## 2011-08-01 DIAGNOSIS — D509 Iron deficiency anemia, unspecified: Secondary | ICD-10-CM | POA: Diagnosis not present

## 2011-08-01 DIAGNOSIS — N186 End stage renal disease: Secondary | ICD-10-CM | POA: Diagnosis not present

## 2011-08-01 DIAGNOSIS — Z992 Dependence on renal dialysis: Secondary | ICD-10-CM | POA: Diagnosis not present

## 2011-08-01 DIAGNOSIS — E1129 Type 2 diabetes mellitus with other diabetic kidney complication: Secondary | ICD-10-CM | POA: Diagnosis not present

## 2011-08-01 DIAGNOSIS — E119 Type 2 diabetes mellitus without complications: Secondary | ICD-10-CM | POA: Diagnosis not present

## 2011-08-01 DIAGNOSIS — N2581 Secondary hyperparathyroidism of renal origin: Secondary | ICD-10-CM | POA: Diagnosis not present

## 2011-08-01 DIAGNOSIS — D631 Anemia in chronic kidney disease: Secondary | ICD-10-CM | POA: Diagnosis not present

## 2011-08-03 DIAGNOSIS — N2581 Secondary hyperparathyroidism of renal origin: Secondary | ICD-10-CM | POA: Diagnosis not present

## 2011-08-03 DIAGNOSIS — E119 Type 2 diabetes mellitus without complications: Secondary | ICD-10-CM | POA: Diagnosis not present

## 2011-08-03 DIAGNOSIS — N039 Chronic nephritic syndrome with unspecified morphologic changes: Secondary | ICD-10-CM | POA: Diagnosis not present

## 2011-08-03 DIAGNOSIS — Z992 Dependence on renal dialysis: Secondary | ICD-10-CM | POA: Diagnosis not present

## 2011-08-03 DIAGNOSIS — D509 Iron deficiency anemia, unspecified: Secondary | ICD-10-CM | POA: Diagnosis not present

## 2011-08-03 DIAGNOSIS — N186 End stage renal disease: Secondary | ICD-10-CM | POA: Diagnosis not present

## 2011-08-06 DIAGNOSIS — Z992 Dependence on renal dialysis: Secondary | ICD-10-CM | POA: Diagnosis not present

## 2011-08-06 DIAGNOSIS — N2581 Secondary hyperparathyroidism of renal origin: Secondary | ICD-10-CM | POA: Diagnosis not present

## 2011-08-06 DIAGNOSIS — N186 End stage renal disease: Secondary | ICD-10-CM | POA: Diagnosis not present

## 2011-08-06 DIAGNOSIS — E119 Type 2 diabetes mellitus without complications: Secondary | ICD-10-CM | POA: Diagnosis not present

## 2011-08-06 DIAGNOSIS — D509 Iron deficiency anemia, unspecified: Secondary | ICD-10-CM | POA: Diagnosis not present

## 2011-08-06 DIAGNOSIS — D631 Anemia in chronic kidney disease: Secondary | ICD-10-CM | POA: Diagnosis not present

## 2011-08-08 DIAGNOSIS — D509 Iron deficiency anemia, unspecified: Secondary | ICD-10-CM | POA: Diagnosis not present

## 2011-08-08 DIAGNOSIS — E119 Type 2 diabetes mellitus without complications: Secondary | ICD-10-CM | POA: Diagnosis not present

## 2011-08-08 DIAGNOSIS — N186 End stage renal disease: Secondary | ICD-10-CM | POA: Diagnosis not present

## 2011-08-08 DIAGNOSIS — D631 Anemia in chronic kidney disease: Secondary | ICD-10-CM | POA: Diagnosis not present

## 2011-08-08 DIAGNOSIS — Z992 Dependence on renal dialysis: Secondary | ICD-10-CM | POA: Diagnosis not present

## 2011-08-08 DIAGNOSIS — N2581 Secondary hyperparathyroidism of renal origin: Secondary | ICD-10-CM | POA: Diagnosis not present

## 2011-08-09 DIAGNOSIS — N186 End stage renal disease: Secondary | ICD-10-CM | POA: Diagnosis not present

## 2011-08-10 DIAGNOSIS — E119 Type 2 diabetes mellitus without complications: Secondary | ICD-10-CM | POA: Diagnosis not present

## 2011-08-10 DIAGNOSIS — D631 Anemia in chronic kidney disease: Secondary | ICD-10-CM | POA: Diagnosis not present

## 2011-08-10 DIAGNOSIS — N2581 Secondary hyperparathyroidism of renal origin: Secondary | ICD-10-CM | POA: Diagnosis not present

## 2011-08-10 DIAGNOSIS — N186 End stage renal disease: Secondary | ICD-10-CM | POA: Diagnosis not present

## 2011-08-10 DIAGNOSIS — Z23 Encounter for immunization: Secondary | ICD-10-CM | POA: Diagnosis not present

## 2011-09-04 DIAGNOSIS — N2581 Secondary hyperparathyroidism of renal origin: Secondary | ICD-10-CM | POA: Insufficient documentation

## 2011-09-04 DIAGNOSIS — D631 Anemia in chronic kidney disease: Secondary | ICD-10-CM | POA: Insufficient documentation

## 2011-09-05 DIAGNOSIS — Z992 Dependence on renal dialysis: Secondary | ICD-10-CM | POA: Diagnosis not present

## 2011-09-05 DIAGNOSIS — N186 End stage renal disease: Secondary | ICD-10-CM | POA: Diagnosis not present

## 2011-09-05 DIAGNOSIS — D509 Iron deficiency anemia, unspecified: Secondary | ICD-10-CM | POA: Diagnosis not present

## 2011-09-05 DIAGNOSIS — N2581 Secondary hyperparathyroidism of renal origin: Secondary | ICD-10-CM | POA: Diagnosis not present

## 2011-09-06 DIAGNOSIS — N186 End stage renal disease: Secondary | ICD-10-CM | POA: Diagnosis not present

## 2011-09-07 DIAGNOSIS — N186 End stage renal disease: Secondary | ICD-10-CM | POA: Diagnosis not present

## 2011-09-07 DIAGNOSIS — Z992 Dependence on renal dialysis: Secondary | ICD-10-CM | POA: Diagnosis not present

## 2011-09-07 DIAGNOSIS — E119 Type 2 diabetes mellitus without complications: Secondary | ICD-10-CM | POA: Diagnosis not present

## 2011-09-07 DIAGNOSIS — D631 Anemia in chronic kidney disease: Secondary | ICD-10-CM | POA: Diagnosis not present

## 2011-09-07 DIAGNOSIS — D509 Iron deficiency anemia, unspecified: Secondary | ICD-10-CM | POA: Diagnosis not present

## 2011-09-07 DIAGNOSIS — N2581 Secondary hyperparathyroidism of renal origin: Secondary | ICD-10-CM | POA: Diagnosis not present

## 2011-09-07 DIAGNOSIS — Z23 Encounter for immunization: Secondary | ICD-10-CM | POA: Diagnosis not present

## 2011-09-27 DIAGNOSIS — I119 Hypertensive heart disease without heart failure: Secondary | ICD-10-CM | POA: Diagnosis not present

## 2011-09-27 DIAGNOSIS — I5020 Unspecified systolic (congestive) heart failure: Secondary | ICD-10-CM | POA: Diagnosis not present

## 2011-09-27 DIAGNOSIS — E7800 Pure hypercholesterolemia, unspecified: Secondary | ICD-10-CM | POA: Diagnosis not present

## 2011-09-27 DIAGNOSIS — Z79899 Other long term (current) drug therapy: Secondary | ICD-10-CM | POA: Diagnosis not present

## 2011-10-07 DIAGNOSIS — N186 End stage renal disease: Secondary | ICD-10-CM | POA: Diagnosis not present

## 2011-10-08 DIAGNOSIS — N2581 Secondary hyperparathyroidism of renal origin: Secondary | ICD-10-CM | POA: Diagnosis not present

## 2011-10-08 DIAGNOSIS — Z23 Encounter for immunization: Secondary | ICD-10-CM | POA: Diagnosis not present

## 2011-10-08 DIAGNOSIS — D631 Anemia in chronic kidney disease: Secondary | ICD-10-CM | POA: Diagnosis not present

## 2011-10-08 DIAGNOSIS — N186 End stage renal disease: Secondary | ICD-10-CM | POA: Diagnosis not present

## 2011-10-08 DIAGNOSIS — Z992 Dependence on renal dialysis: Secondary | ICD-10-CM | POA: Diagnosis not present

## 2011-10-08 DIAGNOSIS — E119 Type 2 diabetes mellitus without complications: Secondary | ICD-10-CM | POA: Diagnosis not present

## 2011-10-08 DIAGNOSIS — D509 Iron deficiency anemia, unspecified: Secondary | ICD-10-CM | POA: Diagnosis not present

## 2011-10-18 DIAGNOSIS — E119 Type 2 diabetes mellitus without complications: Secondary | ICD-10-CM | POA: Diagnosis not present

## 2011-10-18 DIAGNOSIS — N185 Chronic kidney disease, stage 5: Secondary | ICD-10-CM | POA: Diagnosis not present

## 2011-10-18 DIAGNOSIS — I6350 Cerebral infarction due to unspecified occlusion or stenosis of unspecified cerebral artery: Secondary | ICD-10-CM | POA: Diagnosis not present

## 2011-10-23 DIAGNOSIS — E119 Type 2 diabetes mellitus without complications: Secondary | ICD-10-CM | POA: Diagnosis not present

## 2011-10-23 DIAGNOSIS — Z961 Presence of intraocular lens: Secondary | ICD-10-CM | POA: Diagnosis not present

## 2011-10-23 DIAGNOSIS — H3320 Serous retinal detachment, unspecified eye: Secondary | ICD-10-CM | POA: Diagnosis not present

## 2011-10-23 DIAGNOSIS — E11319 Type 2 diabetes mellitus with unspecified diabetic retinopathy without macular edema: Secondary | ICD-10-CM | POA: Diagnosis not present

## 2011-10-31 DIAGNOSIS — E1129 Type 2 diabetes mellitus with other diabetic kidney complication: Secondary | ICD-10-CM | POA: Diagnosis not present

## 2011-11-06 DIAGNOSIS — N186 End stage renal disease: Secondary | ICD-10-CM | POA: Diagnosis not present

## 2011-11-07 DIAGNOSIS — N2581 Secondary hyperparathyroidism of renal origin: Secondary | ICD-10-CM | POA: Diagnosis not present

## 2011-11-07 DIAGNOSIS — N186 End stage renal disease: Secondary | ICD-10-CM | POA: Diagnosis not present

## 2011-11-07 DIAGNOSIS — E119 Type 2 diabetes mellitus without complications: Secondary | ICD-10-CM | POA: Diagnosis not present

## 2011-11-07 DIAGNOSIS — D509 Iron deficiency anemia, unspecified: Secondary | ICD-10-CM | POA: Diagnosis not present

## 2011-11-07 DIAGNOSIS — Z992 Dependence on renal dialysis: Secondary | ICD-10-CM | POA: Diagnosis not present

## 2011-11-07 DIAGNOSIS — D631 Anemia in chronic kidney disease: Secondary | ICD-10-CM | POA: Diagnosis not present

## 2011-11-13 ENCOUNTER — Other Ambulatory Visit (HOSPITAL_COMMUNITY): Payer: Self-pay | Admitting: Nephrology

## 2011-11-13 DIAGNOSIS — N186 End stage renal disease: Secondary | ICD-10-CM

## 2011-11-14 ENCOUNTER — Other Ambulatory Visit (HOSPITAL_COMMUNITY): Payer: Self-pay | Admitting: Nephrology

## 2011-11-14 ENCOUNTER — Encounter (HOSPITAL_COMMUNITY): Payer: Self-pay

## 2011-11-14 ENCOUNTER — Ambulatory Visit (HOSPITAL_COMMUNITY)
Admission: RE | Admit: 2011-11-14 | Discharge: 2011-11-14 | Disposition: A | Discharge status: Home / Self Care | Payer: Medicare Other | Source: Ambulatory Visit | Attending: Nephrology | Admitting: Nephrology

## 2011-11-14 DIAGNOSIS — I120 Hypertensive chronic kidney disease with stage 5 chronic kidney disease or end stage renal disease: Secondary | ICD-10-CM | POA: Insufficient documentation

## 2011-11-14 DIAGNOSIS — M129 Arthropathy, unspecified: Secondary | ICD-10-CM | POA: Insufficient documentation

## 2011-11-14 DIAGNOSIS — Y832 Surgical operation with anastomosis, bypass or graft as the cause of abnormal reaction of the patient, or of later complication, without mention of misadventure at the time of the procedure: Secondary | ICD-10-CM | POA: Insufficient documentation

## 2011-11-14 DIAGNOSIS — T82898A Other specified complication of vascular prosthetic devices, implants and grafts, initial encounter: Secondary | ICD-10-CM | POA: Insufficient documentation

## 2011-11-14 DIAGNOSIS — I82609 Acute embolism and thrombosis of unspecified veins of unspecified upper extremity: Secondary | ICD-10-CM | POA: Diagnosis not present

## 2011-11-14 DIAGNOSIS — N186 End stage renal disease: Secondary | ICD-10-CM

## 2011-11-14 DIAGNOSIS — E119 Type 2 diabetes mellitus without complications: Secondary | ICD-10-CM | POA: Diagnosis not present

## 2011-11-14 HISTORY — DX: Other specified health status: Z78.9

## 2011-11-14 HISTORY — DX: Unspecified osteoarthritis, unspecified site: M19.90

## 2011-11-14 HISTORY — DX: Cerebral infarction, unspecified: I63.9

## 2011-11-14 HISTORY — DX: Chronic kidney disease, unspecified: N18.9

## 2011-11-14 HISTORY — DX: Essential (primary) hypertension: I10

## 2011-11-14 MED ORDER — FENTANYL CITRATE 0.05 MG/ML IJ SOLN
INTRAMUSCULAR | Status: AC
  Filled 2011-11-14: qty 4

## 2011-11-14 MED ORDER — MIDAZOLAM HCL 2 MG/2ML IJ SOLN
INTRAMUSCULAR | Status: AC
  Filled 2011-11-14: qty 4

## 2011-11-14 MED ORDER — IOHEXOL 300 MG/ML  SOLN
100.0000 mL | Freq: Once | INTRAMUSCULAR | Status: AC | PRN
  Administered 2011-11-14: 60 mL via INTRAVENOUS

## 2011-11-14 MED ORDER — MIDAZOLAM HCL 5 MG/5ML IJ SOLN
INTRAMUSCULAR | Status: AC | PRN
  Administered 2011-11-14 (×2): 0.5 mg via INTRAVENOUS
  Administered 2011-11-14: 1 mg via INTRAVENOUS

## 2011-11-14 MED ORDER — ALTEPLASE 100 MG IV SOLR
2.0000 mg | INTRAVENOUS | Status: DC
  Filled 2011-11-14: qty 2

## 2011-11-14 MED ORDER — SODIUM CHLORIDE 0.9 % IV SOLN
INTRAVENOUS | Status: AC | PRN
  Administered 2011-11-14: 20 mL/h via INTRAVENOUS

## 2011-11-14 MED ORDER — HEPARIN SODIUM (PORCINE) 1000 UNIT/ML IJ SOLN
INTRAMUSCULAR | Status: AC | PRN
  Administered 2011-11-14: 3000 [IU] via INTRAVENOUS

## 2011-11-14 MED ORDER — ALTEPLASE 100 MG IV SOLR
INTRAVENOUS | Status: AC | PRN
  Administered 2011-11-14: 2 mg

## 2011-11-14 MED ORDER — HEPARIN SODIUM (PORCINE) 1000 UNIT/ML IJ SOLN
INTRAMUSCULAR | Status: AC
  Filled 2011-11-14: qty 1

## 2011-11-14 MED ORDER — FENTANYL CITRATE 0.05 MG/ML IJ SOLN
INTRAMUSCULAR | Status: AC | PRN
  Administered 2011-11-14: 25 ug via INTRAVENOUS
  Administered 2011-11-14: 50 ug via INTRAVENOUS

## 2011-11-14 NOTE — Discharge Instructions (Signed)
Moderate Sedation, Adult Moderate sedation is given to help you relax or even sleep through a procedure. You may remain sleepy, be clumsy, or have poor balance for several hours following this procedure. Arrange for a responsible adult, family member, or friend to take you home. A responsible adult should stay with you for at least 24 hours or until the medicines have worn off.  Do not participate in any activities where you could become injured for the next 24 hours, or until you feel normal again. Do not:   Drive.   Swim.   Ride a bicycle.   Operate heavy machinery.   Cook.   Use power tools.   Climb ladders.   Work at heights.   Do not make important decisions or sign legal documents until you are improved.   Vomiting may occur if you eat too soon. When you can drink without vomiting, try water, juice, or soup. Try solid foods if you feel little or no nausea.   Only take over-the-counter or prescription medications for pain, discomfort, or fever as directed by your caregiver.If pain medications have been prescribed for you, ask your caregiver how soon it is safe to take them.   Make sure you and your family fully understands everything about the medication given to you. Make sure you understand what side effects may occur.   You should not drink alcohol, take sleeping pills, or medications that cause drowsiness for at least 24 hours.   If you smoke, do not smoke alone.   If you are feeling better, you may resume normal activities 24 hours after receiving sedation.   Keep all appointments as scheduled. Follow all instructions.   Ask questions if you do not understand.  SEEK MEDICAL CARE IF:   Your skin is pale or bluish in color.   You continue to feel sick to your stomach (nauseous) or throw up (vomit).   Your pain is getting worse and not helped by medication.   You have bleeding or swelling.   You are still sleepy or feeling clumsy after 24 hours.  SEEK IMMEDIATE  MEDICAL CARE IF:   You develop a rash.   You have difficulty breathing.   You develop any type of allergic problem.   You have a fever.  Document Released: 03/20/2001 Document Revised: 06/14/2011 Document Reviewed: 08/11/2007 [employer] Patient Information 2012 [employer]. 

## 2011-11-14 NOTE — ED Notes (Signed)
Report to [facility] [employer]. Bruit present to right graft.

## 2011-11-14 NOTE — H&P (Signed)
Shawn [name] is an 51 y.o. male.   Chief Complaint: Right arm dialysis graft placed 1 yr ago Last used Monday without issue Has never had radiologic intervention before today Clotted now; scheduled for thrombolysis and possible angioplasty/stent placement. Possible dialysis catheter placement if necessary HPI: HTN; DM; CVA; ESRD  Past Medical History  Diagnosis Date  . Hypertension   . Stroke   . Diabetes mellitus   . Chronic kidney disease   . Arthritis   . No pertinent past medical history     Past Surgical History  Procedure Date  . Left arm graft 10/2010    No family history on file. Social History:  does not have a smoking history on file. He does not have any smokeless tobacco history on file. His alcohol and drug histories not on file.  Allergies: No Known Allergies   (Not in a hospital admission)  No results found for this or any previous visit (from the past 48 hour(s)). No results found.  Review of Systems  Constitutional: Negative for fever.  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   Cardiovascular: Negative for chest pain.  Gastrointestinal: Negative for nausea and vomiting.  Neurological: Negative for headaches.    Blood pressure 124/64, pulse 78, temperature 98.3 F (36.8 C), temperature source Oral, SpO2 100.00%. Physical Exam  Constitutional: He is oriented to person, place, and time. He appears well-developed and well-nourished.  Cardiovascular: Normal rate, regular rhythm and normal heart sounds.   No murmur heard. Respiratory: Effort normal and breath sounds normal. He has no wheezes.  GI: Soft. Bowel sounds are normal. There is no tenderness.  Musculoskeletal: Normal range of motion.       Uses cane due to eyesight and since cva 4/12  Neurological: He is alert and oriented to person, place, and time. No cranial nerve deficit.  Skin: Skin is warm and dry.  Psychiatric: He has a normal mood and  affect. His behavior is normal. Judgment and thought content normal.     Assessment/Plan Clotted right arm graft Scheduled now for thrombolysis and possible pta/stent placement. Possible dialysis catheter placement if necessary Pt aware of procedure benefits and risks and agreeable to proceed. Consent in chart  Sander Remedios A 11/14/2011, 8:13 AM

## 2011-11-14 NOTE — Procedures (Signed)
Successful declot of right upper arm graft.  No immediate complication.

## 2011-11-14 NOTE — ED Notes (Addendum)
Had more than 6 second run of V tach, patient could feel heart racing. Awake. Dr. Anselm Pancoast aware during run and wire was removed. V tach ceased.

## 2011-12-07 DIAGNOSIS — N186 End stage renal disease: Secondary | ICD-10-CM | POA: Diagnosis not present

## 2011-12-10 DIAGNOSIS — D631 Anemia in chronic kidney disease: Secondary | ICD-10-CM | POA: Diagnosis not present

## 2011-12-10 DIAGNOSIS — D509 Iron deficiency anemia, unspecified: Secondary | ICD-10-CM | POA: Diagnosis not present

## 2011-12-10 DIAGNOSIS — E119 Type 2 diabetes mellitus without complications: Secondary | ICD-10-CM | POA: Diagnosis not present

## 2011-12-10 DIAGNOSIS — Z992 Dependence on renal dialysis: Secondary | ICD-10-CM | POA: Diagnosis not present

## 2011-12-10 DIAGNOSIS — N2581 Secondary hyperparathyroidism of renal origin: Secondary | ICD-10-CM | POA: Diagnosis not present

## 2011-12-10 DIAGNOSIS — N186 End stage renal disease: Secondary | ICD-10-CM | POA: Diagnosis not present

## 2011-12-18 DIAGNOSIS — H4720 Unspecified optic atrophy: Secondary | ICD-10-CM | POA: Diagnosis not present

## 2011-12-18 DIAGNOSIS — E11359 Type 2 diabetes mellitus with proliferative diabetic retinopathy without macular edema: Secondary | ICD-10-CM | POA: Diagnosis not present

## 2011-12-18 DIAGNOSIS — E1139 Type 2 diabetes mellitus with other diabetic ophthalmic complication: Secondary | ICD-10-CM | POA: Diagnosis not present

## 2011-12-18 DIAGNOSIS — H33059 Total retinal detachment, unspecified eye: Secondary | ICD-10-CM | POA: Diagnosis not present

## 2012-01-06 DIAGNOSIS — N186 End stage renal disease: Secondary | ICD-10-CM | POA: Diagnosis not present

## 2012-01-07 DIAGNOSIS — N2581 Secondary hyperparathyroidism of renal origin: Secondary | ICD-10-CM | POA: Diagnosis not present

## 2012-01-07 DIAGNOSIS — E119 Type 2 diabetes mellitus without complications: Secondary | ICD-10-CM | POA: Diagnosis not present

## 2012-01-07 DIAGNOSIS — D509 Iron deficiency anemia, unspecified: Secondary | ICD-10-CM | POA: Diagnosis not present

## 2012-01-07 DIAGNOSIS — Z992 Dependence on renal dialysis: Secondary | ICD-10-CM | POA: Diagnosis not present

## 2012-01-07 DIAGNOSIS — N186 End stage renal disease: Secondary | ICD-10-CM | POA: Diagnosis not present

## 2012-01-19 ENCOUNTER — Other Ambulatory Visit (HOSPITAL_COMMUNITY): Payer: Self-pay | Admitting: Nephrology

## 2012-01-19 DIAGNOSIS — N186 End stage renal disease: Secondary | ICD-10-CM

## 2012-01-21 ENCOUNTER — Ambulatory Visit (HOSPITAL_COMMUNITY)
Admission: RE | Admit: 2012-01-21 | Discharge: 2012-01-21 | Disposition: A | Discharge status: Home / Self Care | Payer: Medicare Other | Source: Ambulatory Visit | Attending: Nephrology | Admitting: Nephrology

## 2012-01-21 ENCOUNTER — Encounter (HOSPITAL_COMMUNITY): Payer: Self-pay

## 2012-01-21 ENCOUNTER — Other Ambulatory Visit (HOSPITAL_COMMUNITY): Payer: Self-pay | Admitting: Nephrology

## 2012-01-21 VITALS — BP 157/65 | HR 89 | Resp 20

## 2012-01-21 DIAGNOSIS — N186 End stage renal disease: Secondary | ICD-10-CM | POA: Diagnosis not present

## 2012-01-21 DIAGNOSIS — I120 Hypertensive chronic kidney disease with stage 5 chronic kidney disease or end stage renal disease: Secondary | ICD-10-CM | POA: Insufficient documentation

## 2012-01-21 DIAGNOSIS — T82898A Other specified complication of vascular prosthetic devices, implants and grafts, initial encounter: Secondary | ICD-10-CM | POA: Diagnosis not present

## 2012-01-21 DIAGNOSIS — E119 Type 2 diabetes mellitus without complications: Secondary | ICD-10-CM | POA: Diagnosis not present

## 2012-01-21 DIAGNOSIS — Z8673 Personal history of transient ischemic attack (TIA), and cerebral infarction without residual deficits: Secondary | ICD-10-CM | POA: Insufficient documentation

## 2012-01-21 DIAGNOSIS — Y849 Medical procedure, unspecified as the cause of abnormal reaction of the patient, or of later complication, without mention of misadventure at the time of the procedure: Secondary | ICD-10-CM | POA: Insufficient documentation

## 2012-01-21 DIAGNOSIS — Z992 Dependence on renal dialysis: Secondary | ICD-10-CM | POA: Insufficient documentation

## 2012-01-21 LAB — POTASSIUM: Potassium: 4 mEq/L (ref 3.5–5.1)

## 2012-01-21 LAB — GLUCOSE, CAPILLARY: Glucose-Capillary: 78 mg/dL (ref 70–99)

## 2012-01-21 MED ORDER — FENTANYL CITRATE 0.05 MG/ML IJ SOLN
INTRAMUSCULAR | Status: AC
  Filled 2012-01-21: qty 4

## 2012-01-21 MED ORDER — MIDAZOLAM HCL 2 MG/2ML IJ SOLN
INTRAMUSCULAR | Status: AC
  Filled 2012-01-21: qty 4

## 2012-01-21 MED ORDER — ALTEPLASE 100 MG IV SOLR
2.0000 mg | Freq: Once | INTRAVENOUS | Status: AC
  Administered 2012-01-21: 2 mg
  Filled 2012-01-21: qty 2

## 2012-01-21 MED ORDER — MIDAZOLAM HCL 5 MG/5ML IJ SOLN
INTRAMUSCULAR | Status: AC | PRN
  Administered 2012-01-21 (×2): 1 mg via INTRAVENOUS

## 2012-01-21 MED ORDER — HEPARIN SODIUM (PORCINE) 1000 UNIT/ML IJ SOLN
INTRAMUSCULAR | Status: AC
  Administered 2012-01-21: 2000 [IU]
  Filled 2012-01-21: qty 1

## 2012-01-21 MED ORDER — FENTANYL CITRATE 0.05 MG/ML IJ SOLN
INTRAMUSCULAR | Status: AC | PRN
  Administered 2012-01-21: 25 ug via INTRAVENOUS
  Administered 2012-01-21: 50 ug via INTRAVENOUS
  Administered 2012-01-21: 25 ug via INTRAVENOUS

## 2012-01-21 MED ORDER — IOHEXOL 300 MG/ML  SOLN
150.0000 mL | Freq: Once | INTRAMUSCULAR | Status: AC | PRN
  Administered 2012-01-21: 50 mL via INTRAVENOUS

## 2012-01-21 NOTE — H&P (Signed)
Shawn [name] is an 51 y.o. male.   Chief Complaint: right arm graft clotted. Last used 01/18/12 Last intervention 11/21/11; good result Pt scheduled for rt arm dialysis graft thrombolysis and possible angioplasty/stent placement. Possible dialysis catheter placement if necessary  HPI: ESRD; HTN; CVA; DM; legally blind  Past Medical History  Diagnosis Date  . Hypertension   . Stroke   . Diabetes mellitus   . Chronic kidney disease   . Arthritis   . No pertinent past medical history     Past Surgical History  Procedure Date  . Left arm graft 10/2010  . Hernia repair     History reviewed. No pertinent family history. Social History:  does not have a smoking history on file. He does not have any smokeless tobacco history on file. His alcohol and drug histories not on file.  Allergies: No Known Allergies   (Not in a hospital admission)  Results for orders placed during the hospital encounter of 01/21/12 (from the past 48 hour(s))  GLUCOSE, CAPILLARY     Status: Normal   Collection Time   01/21/12  8:56 AM      Component Value Range Comment   Glucose-Capillary 78  70 - 99 mg/dL    No results found.  Review of Systems  Constitutional: Negative for fever.  Cardiovascular: Negative for chest pain.  Gastrointestinal: Negative for nausea and vomiting.  Neurological: Negative for headaches.    Blood pressure 143/62, pulse 95, resp. rate 16, SpO2 99.00%. Physical Exam  Constitutional: He appears well-developed.  Eyes:       Legally blind  Cardiovascular: Normal rate, regular rhythm and normal heart sounds.   Respiratory: Effort normal and breath sounds normal. He has no wheezes.  GI: Soft.  Musculoskeletal: Normal range of motion.  Neurological: He is alert.  Skin: Skin is warm and dry.  Psychiatric: He has a normal mood and affect. His behavior is normal. Judgment and thought content normal.     Assessment/Plan Rt arm dialysis graft clotted. Scheduled for  thrombolysis and poss pta/stent.  Possible dialysis catheter placement if needed Consent in chart  Cherise Fedder A 01/21/2012, 9:12 AM

## 2012-01-21 NOTE — Procedures (Signed)
Successful RUE AVG DECLOT WITH 7MM PTA No comp Ready for use full report in pacs

## 2012-01-21 NOTE — Discharge Instructions (Signed)
Sedation or Anesthesia, Adult Care After The medicines you received during the procedure or test today can sometimes cause you to be:  Confused.   Dizzy.   Sleepy.   Clumsy.  HOME CARE INSTRUCTIONS  Do not engage in any activity that requires you to be alert or coordinated for the next 24 hours. This includes driving, operating heavy machinery, using power tools, cooking, climbing, or riding a bicycle.  No swimming, hot tubs, or baths for the next 24 hours.   Stay with family, friends, or a caregiver for the next 24 hours.   Do not make any important decisions in the next 24 hours, such as signing contracts, making important commitments, or making expensive purchases.   Do not drink alcohol for 24 hours.   Avoid solid food if you feel sick to your stomach (have nausea) or if you vomit.   Drink plenty of fluids.   Only take over-the-counter or prescription medicines for pain, discomfort, or fever as directed by your caregiver.   Call your caregiver if you have persistent nausea or if you vomit more than once.   If you cannot reach your caregiver, go to or contact the emergency department.  SEEK IMMEDIATE MEDICAL CARE IF:   You vomit more than once.   You have strange or unusual behavior.   You have a fever.   You have difficulty urinating.   You have severe pain.  Document Released: 06/25/2005 Document Revised: 06/14/2011 Document Reviewed: 08/02/2008 [facility] Patient Information 2012 [place].

## 2012-01-30 DIAGNOSIS — E1129 Type 2 diabetes mellitus with other diabetic kidney complication: Secondary | ICD-10-CM | POA: Diagnosis not present

## 2012-02-06 DIAGNOSIS — N186 End stage renal disease: Secondary | ICD-10-CM | POA: Diagnosis not present

## 2012-02-08 DIAGNOSIS — Z23 Encounter for immunization: Secondary | ICD-10-CM | POA: Diagnosis not present

## 2012-02-08 DIAGNOSIS — D631 Anemia in chronic kidney disease: Secondary | ICD-10-CM | POA: Diagnosis not present

## 2012-02-08 DIAGNOSIS — D509 Iron deficiency anemia, unspecified: Secondary | ICD-10-CM | POA: Diagnosis not present

## 2012-02-08 DIAGNOSIS — E119 Type 2 diabetes mellitus without complications: Secondary | ICD-10-CM | POA: Diagnosis not present

## 2012-02-08 DIAGNOSIS — Z992 Dependence on renal dialysis: Secondary | ICD-10-CM | POA: Diagnosis not present

## 2012-02-08 DIAGNOSIS — N2581 Secondary hyperparathyroidism of renal origin: Secondary | ICD-10-CM | POA: Diagnosis not present

## 2012-02-08 DIAGNOSIS — N186 End stage renal disease: Secondary | ICD-10-CM | POA: Diagnosis not present

## 2012-02-11 DIAGNOSIS — Z23 Encounter for immunization: Secondary | ICD-10-CM | POA: Diagnosis not present

## 2012-02-11 DIAGNOSIS — N186 End stage renal disease: Secondary | ICD-10-CM | POA: Diagnosis not present

## 2012-02-11 DIAGNOSIS — D509 Iron deficiency anemia, unspecified: Secondary | ICD-10-CM | POA: Diagnosis not present

## 2012-02-11 DIAGNOSIS — Z992 Dependence on renal dialysis: Secondary | ICD-10-CM | POA: Diagnosis not present

## 2012-02-11 DIAGNOSIS — N2581 Secondary hyperparathyroidism of renal origin: Secondary | ICD-10-CM | POA: Diagnosis not present

## 2012-02-11 DIAGNOSIS — E119 Type 2 diabetes mellitus without complications: Secondary | ICD-10-CM | POA: Diagnosis not present

## 2012-02-13 DIAGNOSIS — D509 Iron deficiency anemia, unspecified: Secondary | ICD-10-CM | POA: Diagnosis not present

## 2012-02-13 DIAGNOSIS — Z23 Encounter for immunization: Secondary | ICD-10-CM | POA: Diagnosis not present

## 2012-02-13 DIAGNOSIS — E119 Type 2 diabetes mellitus without complications: Secondary | ICD-10-CM | POA: Diagnosis not present

## 2012-02-13 DIAGNOSIS — N186 End stage renal disease: Secondary | ICD-10-CM | POA: Diagnosis not present

## 2012-02-13 DIAGNOSIS — Z992 Dependence on renal dialysis: Secondary | ICD-10-CM | POA: Diagnosis not present

## 2012-02-13 DIAGNOSIS — N2581 Secondary hyperparathyroidism of renal origin: Secondary | ICD-10-CM | POA: Diagnosis not present

## 2012-02-15 DIAGNOSIS — Z23 Encounter for immunization: Secondary | ICD-10-CM | POA: Diagnosis not present

## 2012-02-15 DIAGNOSIS — Z992 Dependence on renal dialysis: Secondary | ICD-10-CM | POA: Diagnosis not present

## 2012-02-15 DIAGNOSIS — D509 Iron deficiency anemia, unspecified: Secondary | ICD-10-CM | POA: Diagnosis not present

## 2012-02-15 DIAGNOSIS — N2581 Secondary hyperparathyroidism of renal origin: Secondary | ICD-10-CM | POA: Diagnosis not present

## 2012-02-15 DIAGNOSIS — E119 Type 2 diabetes mellitus without complications: Secondary | ICD-10-CM | POA: Diagnosis not present

## 2012-02-15 DIAGNOSIS — N186 End stage renal disease: Secondary | ICD-10-CM | POA: Diagnosis not present

## 2012-02-18 DIAGNOSIS — N2581 Secondary hyperparathyroidism of renal origin: Secondary | ICD-10-CM | POA: Diagnosis not present

## 2012-02-18 DIAGNOSIS — N186 End stage renal disease: Secondary | ICD-10-CM | POA: Diagnosis not present

## 2012-02-18 DIAGNOSIS — Z992 Dependence on renal dialysis: Secondary | ICD-10-CM | POA: Diagnosis not present

## 2012-02-18 DIAGNOSIS — Z23 Encounter for immunization: Secondary | ICD-10-CM | POA: Diagnosis not present

## 2012-02-18 DIAGNOSIS — D509 Iron deficiency anemia, unspecified: Secondary | ICD-10-CM | POA: Diagnosis not present

## 2012-02-18 DIAGNOSIS — E119 Type 2 diabetes mellitus without complications: Secondary | ICD-10-CM | POA: Diagnosis not present

## 2012-02-19 DIAGNOSIS — E7800 Pure hypercholesterolemia, unspecified: Secondary | ICD-10-CM | POA: Diagnosis not present

## 2012-02-19 DIAGNOSIS — I5022 Chronic systolic (congestive) heart failure: Secondary | ICD-10-CM | POA: Diagnosis not present

## 2012-02-19 DIAGNOSIS — E559 Vitamin D deficiency, unspecified: Secondary | ICD-10-CM | POA: Diagnosis not present

## 2012-02-19 DIAGNOSIS — D638 Anemia in other chronic diseases classified elsewhere: Secondary | ICD-10-CM | POA: Diagnosis not present

## 2012-02-19 DIAGNOSIS — I119 Hypertensive heart disease without heart failure: Secondary | ICD-10-CM | POA: Diagnosis not present

## 2012-02-20 DIAGNOSIS — D509 Iron deficiency anemia, unspecified: Secondary | ICD-10-CM | POA: Diagnosis not present

## 2012-02-20 DIAGNOSIS — N2581 Secondary hyperparathyroidism of renal origin: Secondary | ICD-10-CM | POA: Diagnosis not present

## 2012-02-20 DIAGNOSIS — E119 Type 2 diabetes mellitus without complications: Secondary | ICD-10-CM | POA: Diagnosis not present

## 2012-02-20 DIAGNOSIS — Z23 Encounter for immunization: Secondary | ICD-10-CM | POA: Diagnosis not present

## 2012-02-20 DIAGNOSIS — Z992 Dependence on renal dialysis: Secondary | ICD-10-CM | POA: Diagnosis not present

## 2012-02-20 DIAGNOSIS — N186 End stage renal disease: Secondary | ICD-10-CM | POA: Diagnosis not present

## 2012-02-22 DIAGNOSIS — E119 Type 2 diabetes mellitus without complications: Secondary | ICD-10-CM | POA: Diagnosis not present

## 2012-02-22 DIAGNOSIS — N186 End stage renal disease: Secondary | ICD-10-CM | POA: Diagnosis not present

## 2012-02-22 DIAGNOSIS — Z23 Encounter for immunization: Secondary | ICD-10-CM | POA: Diagnosis not present

## 2012-02-22 DIAGNOSIS — N2581 Secondary hyperparathyroidism of renal origin: Secondary | ICD-10-CM | POA: Diagnosis not present

## 2012-02-22 DIAGNOSIS — D509 Iron deficiency anemia, unspecified: Secondary | ICD-10-CM | POA: Diagnosis not present

## 2012-02-22 DIAGNOSIS — Z992 Dependence on renal dialysis: Secondary | ICD-10-CM | POA: Diagnosis not present

## 2012-02-25 DIAGNOSIS — Z23 Encounter for immunization: Secondary | ICD-10-CM | POA: Diagnosis not present

## 2012-02-25 DIAGNOSIS — D509 Iron deficiency anemia, unspecified: Secondary | ICD-10-CM | POA: Diagnosis not present

## 2012-02-25 DIAGNOSIS — N2581 Secondary hyperparathyroidism of renal origin: Secondary | ICD-10-CM | POA: Diagnosis not present

## 2012-02-25 DIAGNOSIS — E119 Type 2 diabetes mellitus without complications: Secondary | ICD-10-CM | POA: Diagnosis not present

## 2012-02-25 DIAGNOSIS — N186 End stage renal disease: Secondary | ICD-10-CM | POA: Diagnosis not present

## 2012-02-25 DIAGNOSIS — Z992 Dependence on renal dialysis: Secondary | ICD-10-CM | POA: Diagnosis not present

## 2012-02-27 DIAGNOSIS — Z23 Encounter for immunization: Secondary | ICD-10-CM | POA: Diagnosis not present

## 2012-02-27 DIAGNOSIS — N186 End stage renal disease: Secondary | ICD-10-CM | POA: Diagnosis not present

## 2012-02-27 DIAGNOSIS — E119 Type 2 diabetes mellitus without complications: Secondary | ICD-10-CM | POA: Diagnosis not present

## 2012-02-27 DIAGNOSIS — N2581 Secondary hyperparathyroidism of renal origin: Secondary | ICD-10-CM | POA: Diagnosis not present

## 2012-02-27 DIAGNOSIS — Z992 Dependence on renal dialysis: Secondary | ICD-10-CM | POA: Diagnosis not present

## 2012-02-27 DIAGNOSIS — D509 Iron deficiency anemia, unspecified: Secondary | ICD-10-CM | POA: Diagnosis not present

## 2012-02-29 DIAGNOSIS — E119 Type 2 diabetes mellitus without complications: Secondary | ICD-10-CM | POA: Diagnosis not present

## 2012-02-29 DIAGNOSIS — N186 End stage renal disease: Secondary | ICD-10-CM | POA: Diagnosis not present

## 2012-02-29 DIAGNOSIS — Z992 Dependence on renal dialysis: Secondary | ICD-10-CM | POA: Diagnosis not present

## 2012-02-29 DIAGNOSIS — N2581 Secondary hyperparathyroidism of renal origin: Secondary | ICD-10-CM | POA: Diagnosis not present

## 2012-02-29 DIAGNOSIS — D509 Iron deficiency anemia, unspecified: Secondary | ICD-10-CM | POA: Diagnosis not present

## 2012-02-29 DIAGNOSIS — Z23 Encounter for immunization: Secondary | ICD-10-CM | POA: Diagnosis not present

## 2012-03-03 DIAGNOSIS — Z992 Dependence on renal dialysis: Secondary | ICD-10-CM | POA: Diagnosis not present

## 2012-03-03 DIAGNOSIS — D509 Iron deficiency anemia, unspecified: Secondary | ICD-10-CM | POA: Diagnosis not present

## 2012-03-03 DIAGNOSIS — N186 End stage renal disease: Secondary | ICD-10-CM | POA: Diagnosis not present

## 2012-03-03 DIAGNOSIS — E119 Type 2 diabetes mellitus without complications: Secondary | ICD-10-CM | POA: Diagnosis not present

## 2012-03-03 DIAGNOSIS — N2581 Secondary hyperparathyroidism of renal origin: Secondary | ICD-10-CM | POA: Diagnosis not present

## 2012-03-03 DIAGNOSIS — Z23 Encounter for immunization: Secondary | ICD-10-CM | POA: Diagnosis not present

## 2012-03-04 ENCOUNTER — Emergency Department (HOSPITAL_COMMUNITY)
Admission: EM | Admit: 2012-03-04 | Discharge: 2012-03-04 | Disposition: A | Discharge status: Home / Self Care | Payer: Medicare Other | Attending: Emergency Medicine | Admitting: Emergency Medicine

## 2012-03-04 ENCOUNTER — Encounter (HOSPITAL_COMMUNITY): Payer: Self-pay | Admitting: *Deleted

## 2012-03-04 DIAGNOSIS — I129 Hypertensive chronic kidney disease with stage 1 through stage 4 chronic kidney disease, or unspecified chronic kidney disease: Secondary | ICD-10-CM | POA: Diagnosis not present

## 2012-03-04 DIAGNOSIS — Z794 Long term (current) use of insulin: Secondary | ICD-10-CM | POA: Diagnosis not present

## 2012-03-04 DIAGNOSIS — R5381 Other malaise: Secondary | ICD-10-CM | POA: Insufficient documentation

## 2012-03-04 DIAGNOSIS — E878 Other disorders of electrolyte and fluid balance, not elsewhere classified: Secondary | ICD-10-CM | POA: Insufficient documentation

## 2012-03-04 DIAGNOSIS — E119 Type 2 diabetes mellitus without complications: Secondary | ICD-10-CM | POA: Insufficient documentation

## 2012-03-04 DIAGNOSIS — I6789 Other cerebrovascular disease: Secondary | ICD-10-CM | POA: Diagnosis not present

## 2012-03-04 DIAGNOSIS — R42 Dizziness and giddiness: Secondary | ICD-10-CM

## 2012-03-04 DIAGNOSIS — R5383 Other fatigue: Secondary | ICD-10-CM | POA: Diagnosis not present

## 2012-03-04 DIAGNOSIS — Z79899 Other long term (current) drug therapy: Secondary | ICD-10-CM | POA: Diagnosis not present

## 2012-03-04 DIAGNOSIS — R6889 Other general symptoms and signs: Secondary | ICD-10-CM | POA: Diagnosis not present

## 2012-03-04 DIAGNOSIS — Z8673 Personal history of transient ischemic attack (TIA), and cerebral infarction without residual deficits: Secondary | ICD-10-CM | POA: Insufficient documentation

## 2012-03-04 DIAGNOSIS — N189 Chronic kidney disease, unspecified: Secondary | ICD-10-CM | POA: Insufficient documentation

## 2012-03-04 HISTORY — DX: Blindness, one eye, unspecified eye: H54.40

## 2012-03-04 LAB — CBC WITH DIFFERENTIAL/PLATELET
Eosinophils Absolute: 0.4 10*3/uL (ref 0.0–0.7)
Eosinophils Relative: 5 % (ref 0–5)
Hemoglobin: 10.9 g/dL — ABNORMAL LOW (ref 13.0–17.0)
Lymphocytes Relative: 19 % (ref 12–46)
Lymphs Abs: 1.5 10*3/uL (ref 0.7–4.0)
MCH: 29.9 pg (ref 26.0–34.0)
MCV: 91.2 fL (ref 78.0–100.0)
Monocytes Relative: 5 % (ref 3–12)
Neutrophils Relative %: 71 % (ref 43–77)
Platelets: 320 10*3/uL (ref 150–400)
RBC: 3.65 MIL/uL — ABNORMAL LOW (ref 4.22–5.81)
WBC: 8.3 10*3/uL (ref 4.0–10.5)

## 2012-03-04 LAB — COMPREHENSIVE METABOLIC PANEL
ALT: 7 U/L (ref 0–53)
Alkaline Phosphatase: 113 U/L (ref 39–117)
BUN: 28 mg/dL — ABNORMAL HIGH (ref 6–23)
CO2: 31 mEq/L (ref 19–32)
GFR calc Af Amer: 8 mL/min — ABNORMAL LOW (ref >90)
GFR calc non Af Amer: 7 mL/min — ABNORMAL LOW (ref >90)
Glucose, Bld: 148 mg/dL — ABNORMAL HIGH (ref 70–99)
Potassium: 4.4 mEq/L (ref 3.5–5.1)
Sodium: 139 mEq/L (ref 135–145)
Total Protein: 7.4 g/dL (ref 6.0–8.3)

## 2012-03-04 NOTE — ED Provider Notes (Signed)
History     CSN: [id number]  Arrival date & time 03/04/12  1128   First MD Initiated Contact with Patient 03/04/12 1200      Chief Complaint  Patient presents with  . Weakness    (Consider location/radiation/quality/duration/timing/severity/associated sxs/prior treatment) HPI Comments: Shawn [name] 51 y.o. male   The chief complaint is: Patient presents with:   Weakness    51 y/o male with history of stroke and kidney failure  presents today with history of right sided disequilibrium. He states that he awoke this morning and felt very off balance.  He denies dizziness or vertiginous symptoms , nausea or vomiting. He states that he felt off balance toward the right. He felt as if a "weight were pulling me down."  Worse with head movement toward the right.  He states that the symptoms lasted approximately 1 hour.    No alleviating factors. 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 He denies, difficulty with speech, new loss of vision, new onset weakness or paresthesias.  His symptoms have resolved at this point. Denies fevers, chills, myalgias, arthralgias, nausea, vomiting, diarrhea, CP,SOB. He goes to dialysis 3 days a week and is due tomorrow. He has not missed any of his appointments.     The history is provided by the patient and the EMS personnel. No language interpreter was used.    Past Medical History  Diagnosis Date  . Hypertension   . Stroke   . Diabetes mellitus   . Chronic kidney disease   . Arthritis   . No pertinent past medical history   . Blind right eye     Past Surgical History  Procedure Date  . Left arm graft 10/2010  . Hernia repair     No family history on file.  History  Substance Use Topics  . Smoking status: Not on file  . Smokeless tobacco: Not on file  . Alcohol Use:       Review of Systems  Constitutional: Negative for fever, chills and fatigue.  HENT: Negative for  hearing loss, trouble swallowing, neck pain and neck stiffness.   Eyes: Negative for visual disturbance.  Respiratory: Negative for chest tightness and shortness of breath.   Cardiovascular: Negative for chest pain and palpitations.  Gastrointestinal: Negative for nausea, vomiting, abdominal pain, diarrhea and blood in stool.  Musculoskeletal: Positive for gait problem.  Neurological: Positive for weakness. Negative for syncope, speech difficulty, numbness and headaches.    Allergies  Review of patient's allergies indicates no known allergies.  Home Medications   Current Outpatient Rx  Name Route Sig Dispense Refill  . ASPIRIN 325 MG PO TABS Oral Take 325 mg by mouth daily.    . ATORVASTATIN CALCIUM 40 MG PO TABS Oral Take 40 mg by mouth daily.    . B COMPLEX-B12 PO TABS Oral Take 1 tablet by mouth daily.    . COLESEVELAM HCL 625 MG PO TABS Oral Take 1,250 mg by mouth daily with supper.    Marland Kitchen DIALYVITE 3000 3 MG PO TABS Oral Take 1 tablet by mouth daily.    . INSULIN ISOPHANE & REGULAR (70-30) 100 UNIT/ML [place] SUSP Subcutaneous Inject 15 Units into the skin 2 (two) times daily with a meal.    . LISINOPRIL 10 MG PO TABS Oral Take 10 mg by mouth daily. Standing bp of 110 or more      BP 133/58  Pulse 97  Temp  98.6 F (37 C) (Oral)  Resp 21  SpO2 100%  Physical Exam  Nursing note and vitals reviewed. Constitutional: He is oriented to person, place, and time. He appears well-developed and well-nourished. No distress.  HENT:  Head: Normocephalic and atraumatic.  Eyes: Conjunctivae and EOM are normal. Pupils are equal, round, and reactive to light. No scleral icterus.  Neck: Normal range of motion. Neck supple.  Cardiovascular: Normal rate, regular rhythm and normal heart sounds.   Pulmonary/Chest: Effort normal and breath sounds normal. No respiratory distress.  Abdominal: Soft. There is no tenderness.  Musculoskeletal: He exhibits no edema.  Neurological: He is alert and oriented  to person, place, and time. He has normal reflexes. No cranial nerve deficit. He exhibits normal muscle tone. Coordination normal.       Rt Upper and Lower extremity weakness at baseline per patient.  Skin: Skin is warm and dry. He is not diaphoretic.  Psychiatric: His behavior is normal.    ED Course  Procedures (including critical care time)  Labs Reviewed  CBC WITH DIFFERENTIAL - Abnormal; Notable for the following:    RBC 3.65 (*)     Hemoglobin 10.9 (*)     HCT 33.3 (*)     All other components within normal limits  COMPREHENSIVE METABOLIC PANEL - Abnormal; Notable for the following:    Glucose, Bld 148 (*)     BUN 28 (*)     Creatinine, Ser 7.73 (*)     GFR calc non Af Amer 7 (*)     GFR calc Af Amer 8 (*)     All other components within normal limits  URINALYSIS, ROUTINE W REFLEX MICROSCOPIC   No results found. Labs are unrmarkable. Hgb Is abnormal at 10.9.  He has had previous HGb at this level and is a dialysis patient.  I suspect anemia is due to this.  Patient's renal function is at baseline.  I have spoken with  The patient;s neurologist, Dr. Clydene Fake nurse who relayed the case to Dr. Leonie Man.  He agrees the patient may follow up outpatient. The office wiill call with follow up appointment.  I have also given the patient the direct extension for the scheduling coordinator at [facility] who has been informed of the patient.  1. Disequilibrium       MDM  Patient is safe for discharge. Discussed reasons to seek immediate care. Patient expresses understanding and agrees with plan.         Margarita Mail, PA-C 03/06/12 [place], PA-C 03/08/12 2008

## 2012-03-04 NOTE — ED Notes (Addendum)
Pt states that he thought when he woke this morning that he was tired from dialysis yesterday. Pt states that the weakness continued though so he decided to get check. Pt denies any problems with dialysis yesterday. pt states that he does have problems with hypotension in dialysis occasionally. Thrill and bruit present in rt upper arm.

## 2012-03-04 NOTE — ED Notes (Signed)
Per EMS- pt noticed weakness on rt side. Pt has hx of stroke that and reports residual on rt side. Pt also has dialysis in rt arm. Grip strength equal. Steady gait. No neuro deficits noted. No orthostatic changes with EMS. Vitals stable with EMS 134/86. HR 94. Resp 16. SPO2 98% on room air. CBG 115.

## 2012-03-04 NOTE — ED Notes (Signed)
Spoke to patient about urine sample being needed.  Patient stated that he produces very little urine.  Usually once a day.  Patient already voided when he was at home.  Patient also stated he had dialysis yesterday.

## 2012-03-04 NOTE — ED Notes (Signed)
P/c home in [facility]. Pt voiced understanding of d/c instructions and follow up care

## 2012-03-05 DIAGNOSIS — Z992 Dependence on renal dialysis: Secondary | ICD-10-CM | POA: Diagnosis not present

## 2012-03-05 DIAGNOSIS — D509 Iron deficiency anemia, unspecified: Secondary | ICD-10-CM | POA: Diagnosis not present

## 2012-03-05 DIAGNOSIS — E119 Type 2 diabetes mellitus without complications: Secondary | ICD-10-CM | POA: Diagnosis not present

## 2012-03-05 DIAGNOSIS — N186 End stage renal disease: Secondary | ICD-10-CM | POA: Diagnosis not present

## 2012-03-05 DIAGNOSIS — N2581 Secondary hyperparathyroidism of renal origin: Secondary | ICD-10-CM | POA: Diagnosis not present

## 2012-03-05 DIAGNOSIS — Z23 Encounter for immunization: Secondary | ICD-10-CM | POA: Diagnosis not present

## 2012-03-07 DIAGNOSIS — N186 End stage renal disease: Secondary | ICD-10-CM | POA: Diagnosis not present

## 2012-03-07 DIAGNOSIS — Z992 Dependence on renal dialysis: Secondary | ICD-10-CM | POA: Diagnosis not present

## 2012-03-07 DIAGNOSIS — N2581 Secondary hyperparathyroidism of renal origin: Secondary | ICD-10-CM | POA: Diagnosis not present

## 2012-03-07 DIAGNOSIS — E119 Type 2 diabetes mellitus without complications: Secondary | ICD-10-CM | POA: Diagnosis not present

## 2012-03-07 DIAGNOSIS — D509 Iron deficiency anemia, unspecified: Secondary | ICD-10-CM | POA: Diagnosis not present

## 2012-03-07 DIAGNOSIS — Z23 Encounter for immunization: Secondary | ICD-10-CM | POA: Diagnosis not present

## 2012-03-08 DIAGNOSIS — N186 End stage renal disease: Secondary | ICD-10-CM | POA: Diagnosis not present

## 2012-03-09 NOTE — ED Provider Notes (Signed)
Medical screening examination/treatment/procedure(s) were conducted as a shared visit with non-physician practitioner(s) and myself.  I personally evaluated the patient during the encounter  Barbara Cower, MD 03/09/12 [phone]

## 2012-03-10 DIAGNOSIS — Z992 Dependence on renal dialysis: Secondary | ICD-10-CM | POA: Diagnosis not present

## 2012-03-10 DIAGNOSIS — E119 Type 2 diabetes mellitus without complications: Secondary | ICD-10-CM | POA: Diagnosis not present

## 2012-03-10 DIAGNOSIS — D509 Iron deficiency anemia, unspecified: Secondary | ICD-10-CM | POA: Diagnosis not present

## 2012-03-10 DIAGNOSIS — Z23 Encounter for immunization: Secondary | ICD-10-CM | POA: Diagnosis not present

## 2012-03-10 DIAGNOSIS — N2581 Secondary hyperparathyroidism of renal origin: Secondary | ICD-10-CM | POA: Diagnosis not present

## 2012-03-10 DIAGNOSIS — N186 End stage renal disease: Secondary | ICD-10-CM | POA: Diagnosis not present

## 2012-03-10 DIAGNOSIS — D631 Anemia in chronic kidney disease: Secondary | ICD-10-CM | POA: Diagnosis not present

## 2012-04-07 DIAGNOSIS — N186 End stage renal disease: Secondary | ICD-10-CM | POA: Diagnosis not present

## 2012-04-08 DIAGNOSIS — R42 Dizziness and giddiness: Secondary | ICD-10-CM | POA: Diagnosis not present

## 2012-04-09 DIAGNOSIS — N2581 Secondary hyperparathyroidism of renal origin: Secondary | ICD-10-CM | POA: Diagnosis not present

## 2012-04-09 DIAGNOSIS — N186 End stage renal disease: Secondary | ICD-10-CM | POA: Diagnosis not present

## 2012-04-09 DIAGNOSIS — D631 Anemia in chronic kidney disease: Secondary | ICD-10-CM | POA: Diagnosis not present

## 2012-04-09 DIAGNOSIS — Z992 Dependence on renal dialysis: Secondary | ICD-10-CM | POA: Diagnosis not present

## 2012-04-09 DIAGNOSIS — D509 Iron deficiency anemia, unspecified: Secondary | ICD-10-CM | POA: Diagnosis not present

## 2012-04-11 DIAGNOSIS — D631 Anemia in chronic kidney disease: Secondary | ICD-10-CM | POA: Diagnosis not present

## 2012-04-11 DIAGNOSIS — Z992 Dependence on renal dialysis: Secondary | ICD-10-CM | POA: Diagnosis not present

## 2012-04-11 DIAGNOSIS — N2581 Secondary hyperparathyroidism of renal origin: Secondary | ICD-10-CM | POA: Diagnosis not present

## 2012-04-11 DIAGNOSIS — N186 End stage renal disease: Secondary | ICD-10-CM | POA: Diagnosis not present

## 2012-04-11 DIAGNOSIS — D509 Iron deficiency anemia, unspecified: Secondary | ICD-10-CM | POA: Diagnosis not present

## 2012-04-11 DIAGNOSIS — N039 Chronic nephritic syndrome with unspecified morphologic changes: Secondary | ICD-10-CM | POA: Diagnosis not present

## 2012-04-14 DIAGNOSIS — N039 Chronic nephritic syndrome with unspecified morphologic changes: Secondary | ICD-10-CM | POA: Diagnosis not present

## 2012-04-14 DIAGNOSIS — N186 End stage renal disease: Secondary | ICD-10-CM | POA: Diagnosis not present

## 2012-04-14 DIAGNOSIS — Z992 Dependence on renal dialysis: Secondary | ICD-10-CM | POA: Diagnosis not present

## 2012-04-14 DIAGNOSIS — D509 Iron deficiency anemia, unspecified: Secondary | ICD-10-CM | POA: Diagnosis not present

## 2012-04-14 DIAGNOSIS — N2581 Secondary hyperparathyroidism of renal origin: Secondary | ICD-10-CM | POA: Diagnosis not present

## 2012-04-16 DIAGNOSIS — N039 Chronic nephritic syndrome with unspecified morphologic changes: Secondary | ICD-10-CM | POA: Diagnosis not present

## 2012-04-16 DIAGNOSIS — Z992 Dependence on renal dialysis: Secondary | ICD-10-CM | POA: Diagnosis not present

## 2012-04-16 DIAGNOSIS — N186 End stage renal disease: Secondary | ICD-10-CM | POA: Diagnosis not present

## 2012-04-16 DIAGNOSIS — N2581 Secondary hyperparathyroidism of renal origin: Secondary | ICD-10-CM | POA: Diagnosis not present

## 2012-04-16 DIAGNOSIS — D509 Iron deficiency anemia, unspecified: Secondary | ICD-10-CM | POA: Diagnosis not present

## 2012-04-18 DIAGNOSIS — Z992 Dependence on renal dialysis: Secondary | ICD-10-CM | POA: Diagnosis not present

## 2012-04-18 DIAGNOSIS — N2581 Secondary hyperparathyroidism of renal origin: Secondary | ICD-10-CM | POA: Diagnosis not present

## 2012-04-18 DIAGNOSIS — N039 Chronic nephritic syndrome with unspecified morphologic changes: Secondary | ICD-10-CM | POA: Diagnosis not present

## 2012-04-18 DIAGNOSIS — N186 End stage renal disease: Secondary | ICD-10-CM | POA: Diagnosis not present

## 2012-04-18 DIAGNOSIS — D509 Iron deficiency anemia, unspecified: Secondary | ICD-10-CM | POA: Diagnosis not present

## 2012-04-21 DIAGNOSIS — D631 Anemia in chronic kidney disease: Secondary | ICD-10-CM | POA: Diagnosis not present

## 2012-04-21 DIAGNOSIS — D509 Iron deficiency anemia, unspecified: Secondary | ICD-10-CM | POA: Diagnosis not present

## 2012-04-21 DIAGNOSIS — N2581 Secondary hyperparathyroidism of renal origin: Secondary | ICD-10-CM | POA: Diagnosis not present

## 2012-04-21 DIAGNOSIS — N186 End stage renal disease: Secondary | ICD-10-CM | POA: Diagnosis not present

## 2012-04-21 DIAGNOSIS — Z992 Dependence on renal dialysis: Secondary | ICD-10-CM | POA: Diagnosis not present

## 2012-04-22 DIAGNOSIS — E1139 Type 2 diabetes mellitus with other diabetic ophthalmic complication: Secondary | ICD-10-CM | POA: Diagnosis not present

## 2012-04-22 DIAGNOSIS — E11359 Type 2 diabetes mellitus with proliferative diabetic retinopathy without macular edema: Secondary | ICD-10-CM | POA: Diagnosis not present

## 2012-04-23 DIAGNOSIS — N2581 Secondary hyperparathyroidism of renal origin: Secondary | ICD-10-CM | POA: Diagnosis not present

## 2012-04-23 DIAGNOSIS — D509 Iron deficiency anemia, unspecified: Secondary | ICD-10-CM | POA: Diagnosis not present

## 2012-04-23 DIAGNOSIS — N186 End stage renal disease: Secondary | ICD-10-CM | POA: Diagnosis not present

## 2012-04-23 DIAGNOSIS — Z992 Dependence on renal dialysis: Secondary | ICD-10-CM | POA: Diagnosis not present

## 2012-04-23 DIAGNOSIS — D631 Anemia in chronic kidney disease: Secondary | ICD-10-CM | POA: Diagnosis not present

## 2012-04-25 DIAGNOSIS — N186 End stage renal disease: Secondary | ICD-10-CM | POA: Diagnosis not present

## 2012-04-25 DIAGNOSIS — N2581 Secondary hyperparathyroidism of renal origin: Secondary | ICD-10-CM | POA: Diagnosis not present

## 2012-04-25 DIAGNOSIS — Z992 Dependence on renal dialysis: Secondary | ICD-10-CM | POA: Diagnosis not present

## 2012-04-25 DIAGNOSIS — D631 Anemia in chronic kidney disease: Secondary | ICD-10-CM | POA: Diagnosis not present

## 2012-04-25 DIAGNOSIS — D509 Iron deficiency anemia, unspecified: Secondary | ICD-10-CM | POA: Diagnosis not present

## 2012-04-28 DIAGNOSIS — N039 Chronic nephritic syndrome with unspecified morphologic changes: Secondary | ICD-10-CM | POA: Diagnosis not present

## 2012-04-28 DIAGNOSIS — N2581 Secondary hyperparathyroidism of renal origin: Secondary | ICD-10-CM | POA: Diagnosis not present

## 2012-04-28 DIAGNOSIS — N186 End stage renal disease: Secondary | ICD-10-CM | POA: Diagnosis not present

## 2012-04-28 DIAGNOSIS — Z992 Dependence on renal dialysis: Secondary | ICD-10-CM | POA: Diagnosis not present

## 2012-04-28 DIAGNOSIS — D509 Iron deficiency anemia, unspecified: Secondary | ICD-10-CM | POA: Diagnosis not present

## 2012-04-30 DIAGNOSIS — N2581 Secondary hyperparathyroidism of renal origin: Secondary | ICD-10-CM | POA: Diagnosis not present

## 2012-04-30 DIAGNOSIS — D509 Iron deficiency anemia, unspecified: Secondary | ICD-10-CM | POA: Diagnosis not present

## 2012-04-30 DIAGNOSIS — N186 End stage renal disease: Secondary | ICD-10-CM | POA: Diagnosis not present

## 2012-04-30 DIAGNOSIS — Z992 Dependence on renal dialysis: Secondary | ICD-10-CM | POA: Diagnosis not present

## 2012-04-30 DIAGNOSIS — N039 Chronic nephritic syndrome with unspecified morphologic changes: Secondary | ICD-10-CM | POA: Diagnosis not present

## 2012-05-02 DIAGNOSIS — N2581 Secondary hyperparathyroidism of renal origin: Secondary | ICD-10-CM | POA: Diagnosis not present

## 2012-05-02 DIAGNOSIS — D631 Anemia in chronic kidney disease: Secondary | ICD-10-CM | POA: Diagnosis not present

## 2012-05-02 DIAGNOSIS — Z992 Dependence on renal dialysis: Secondary | ICD-10-CM | POA: Diagnosis not present

## 2012-05-02 DIAGNOSIS — N186 End stage renal disease: Secondary | ICD-10-CM | POA: Diagnosis not present

## 2012-05-02 DIAGNOSIS — D509 Iron deficiency anemia, unspecified: Secondary | ICD-10-CM | POA: Diagnosis not present

## 2012-05-05 ENCOUNTER — Other Ambulatory Visit (HOSPITAL_COMMUNITY): Payer: Self-pay | Admitting: Nephrology

## 2012-05-05 ENCOUNTER — Ambulatory Visit (HOSPITAL_COMMUNITY)
Admission: RE | Admit: 2012-05-05 | Discharge: 2012-05-05 | Disposition: A | Discharge status: Home / Self Care | Payer: Medicare Other | Source: Ambulatory Visit | Attending: Nephrology | Admitting: Nephrology

## 2012-05-05 DIAGNOSIS — N186 End stage renal disease: Secondary | ICD-10-CM

## 2012-05-05 DIAGNOSIS — T82898A Other specified complication of vascular prosthetic devices, implants and grafts, initial encounter: Secondary | ICD-10-CM | POA: Insufficient documentation

## 2012-05-05 DIAGNOSIS — Y849 Medical procedure, unspecified as the cause of abnormal reaction of the patient, or of later complication, without mention of misadventure at the time of the procedure: Secondary | ICD-10-CM | POA: Insufficient documentation

## 2012-05-05 LAB — POTASSIUM: Potassium: 3.4 mEq/L — ABNORMAL LOW (ref 3.5–5.1)

## 2012-05-05 MED ORDER — HEPARIN SODIUM (PORCINE) 1000 UNIT/ML IJ SOLN
INTRAMUSCULAR | Status: DC | PRN
  Administered 2012-05-05: 3000 [IU] via INTRAVENOUS

## 2012-05-05 MED ORDER — GLUCOSE-VITAMIN C 4-6 GM-MG PO CHEW
CHEWABLE_TABLET | ORAL | Status: AC
  Filled 2012-05-05: qty 1

## 2012-05-05 MED ORDER — IOHEXOL 300 MG/ML  SOLN
100.0000 mL | Freq: Once | INTRAMUSCULAR | Status: AC | PRN
  Administered 2012-05-05: 120 mL via INTRAVENOUS

## 2012-05-05 MED ORDER — GLUCOSE 4 G PO CHEW
1.0000 | CHEWABLE_TABLET | Freq: Once | ORAL | Status: AC
  Administered 2012-05-05: 4 g via ORAL

## 2012-05-05 MED ORDER — FENTANYL CITRATE 0.05 MG/ML IJ SOLN
INTRAMUSCULAR | Status: AC
  Filled 2012-05-05: qty 2

## 2012-05-05 MED ORDER — FENTANYL CITRATE 0.05 MG/ML IJ SOLN
INTRAMUSCULAR | Status: DC | PRN
  Administered 2012-05-05: 50 ug via INTRAVENOUS
  Administered 2012-05-05 (×2): 25 ug via INTRAVENOUS

## 2012-05-05 MED ORDER — HEPARIN SODIUM (PORCINE) 1000 UNIT/ML IJ SOLN
INTRAMUSCULAR | Status: AC
  Administered 2012-05-05: 3000 [IU]
  Filled 2012-05-05: qty 1

## 2012-05-05 MED ORDER — ALTEPLASE 100 MG IV SOLR
4.0000 mg | Freq: Once | INTRAVENOUS | Status: AC
  Administered 2012-05-05: 4 mg
  Filled 2012-05-05: qty 4

## 2012-05-05 NOTE — Procedures (Signed)
Interventional Radiology Procedure Note  Procedure: RUE straight AVG declot and venous angioplasty Complications: Non  Recommendations: - May resume dialysis  Signed,  Criselda Peaches, MD Vascular & Interventional Radiologist [facility]

## 2012-05-06 ENCOUNTER — Telehealth (HOSPITAL_COMMUNITY): Payer: Self-pay | Admitting: *Deleted

## 2012-05-06 DIAGNOSIS — N186 End stage renal disease: Secondary | ICD-10-CM | POA: Diagnosis not present

## 2012-05-06 DIAGNOSIS — D631 Anemia in chronic kidney disease: Secondary | ICD-10-CM | POA: Diagnosis not present

## 2012-05-06 DIAGNOSIS — D509 Iron deficiency anemia, unspecified: Secondary | ICD-10-CM | POA: Diagnosis not present

## 2012-05-06 DIAGNOSIS — Z992 Dependence on renal dialysis: Secondary | ICD-10-CM | POA: Diagnosis not present

## 2012-05-06 DIAGNOSIS — N2581 Secondary hyperparathyroidism of renal origin: Secondary | ICD-10-CM | POA: Diagnosis not present

## 2012-05-06 NOTE — Telephone Encounter (Signed)
Radiology post declot phone call.  Pt on dialysis right now.  Doing well post procedure.  No problems, questions or complaints.

## 2012-05-07 DIAGNOSIS — D509 Iron deficiency anemia, unspecified: Secondary | ICD-10-CM | POA: Diagnosis not present

## 2012-05-07 DIAGNOSIS — N039 Chronic nephritic syndrome with unspecified morphologic changes: Secondary | ICD-10-CM | POA: Diagnosis not present

## 2012-05-07 DIAGNOSIS — Z992 Dependence on renal dialysis: Secondary | ICD-10-CM | POA: Diagnosis not present

## 2012-05-07 DIAGNOSIS — N2581 Secondary hyperparathyroidism of renal origin: Secondary | ICD-10-CM | POA: Diagnosis not present

## 2012-05-07 DIAGNOSIS — N186 End stage renal disease: Secondary | ICD-10-CM | POA: Diagnosis not present

## 2012-05-08 DIAGNOSIS — N186 End stage renal disease: Secondary | ICD-10-CM | POA: Diagnosis not present

## 2012-05-09 DIAGNOSIS — D509 Iron deficiency anemia, unspecified: Secondary | ICD-10-CM | POA: Diagnosis not present

## 2012-05-09 DIAGNOSIS — Z992 Dependence on renal dialysis: Secondary | ICD-10-CM | POA: Diagnosis not present

## 2012-05-09 DIAGNOSIS — N2581 Secondary hyperparathyroidism of renal origin: Secondary | ICD-10-CM | POA: Diagnosis not present

## 2012-05-09 DIAGNOSIS — D631 Anemia in chronic kidney disease: Secondary | ICD-10-CM | POA: Diagnosis not present

## 2012-05-09 DIAGNOSIS — N186 End stage renal disease: Secondary | ICD-10-CM | POA: Diagnosis not present

## 2012-06-07 DIAGNOSIS — N186 End stage renal disease: Secondary | ICD-10-CM | POA: Diagnosis not present

## 2012-06-08 HISTORY — PX: COLONOSCOPY: SHX174

## 2012-06-09 DIAGNOSIS — D509 Iron deficiency anemia, unspecified: Secondary | ICD-10-CM | POA: Diagnosis not present

## 2012-06-09 DIAGNOSIS — N2581 Secondary hyperparathyroidism of renal origin: Secondary | ICD-10-CM | POA: Diagnosis not present

## 2012-06-09 DIAGNOSIS — D631 Anemia in chronic kidney disease: Secondary | ICD-10-CM | POA: Diagnosis not present

## 2012-06-09 DIAGNOSIS — Z992 Dependence on renal dialysis: Secondary | ICD-10-CM | POA: Diagnosis not present

## 2012-06-09 DIAGNOSIS — E119 Type 2 diabetes mellitus without complications: Secondary | ICD-10-CM | POA: Diagnosis not present

## 2012-06-09 DIAGNOSIS — N186 End stage renal disease: Secondary | ICD-10-CM | POA: Diagnosis not present

## 2012-06-10 DIAGNOSIS — I6350 Cerebral infarction due to unspecified occlusion or stenosis of unspecified cerebral artery: Secondary | ICD-10-CM | POA: Diagnosis not present

## 2012-06-12 DIAGNOSIS — I119 Hypertensive heart disease without heart failure: Secondary | ICD-10-CM | POA: Diagnosis not present

## 2012-06-12 DIAGNOSIS — I5022 Chronic systolic (congestive) heart failure: Secondary | ICD-10-CM | POA: Diagnosis not present

## 2012-06-12 DIAGNOSIS — Z79899 Other long term (current) drug therapy: Secondary | ICD-10-CM | POA: Diagnosis not present

## 2012-06-17 DIAGNOSIS — Z1211 Encounter for screening for malignant neoplasm of colon: Secondary | ICD-10-CM | POA: Diagnosis not present

## 2012-06-17 DIAGNOSIS — Z800 Family history of malignant neoplasm of digestive organs: Secondary | ICD-10-CM | POA: Diagnosis not present

## 2012-07-08 DIAGNOSIS — N186 End stage renal disease: Secondary | ICD-10-CM | POA: Diagnosis not present

## 2012-07-10 DIAGNOSIS — E119 Type 2 diabetes mellitus without complications: Secondary | ICD-10-CM | POA: Diagnosis not present

## 2012-07-10 DIAGNOSIS — I6350 Cerebral infarction due to unspecified occlusion or stenosis of unspecified cerebral artery: Secondary | ICD-10-CM | POA: Diagnosis not present

## 2012-07-10 DIAGNOSIS — N185 Chronic kidney disease, stage 5: Secondary | ICD-10-CM | POA: Diagnosis not present

## 2012-07-10 DIAGNOSIS — R42 Dizziness and giddiness: Secondary | ICD-10-CM | POA: Diagnosis not present

## 2012-07-11 DIAGNOSIS — D509 Iron deficiency anemia, unspecified: Secondary | ICD-10-CM | POA: Diagnosis not present

## 2012-07-11 DIAGNOSIS — Z992 Dependence on renal dialysis: Secondary | ICD-10-CM | POA: Diagnosis not present

## 2012-07-11 DIAGNOSIS — N2581 Secondary hyperparathyroidism of renal origin: Secondary | ICD-10-CM | POA: Diagnosis not present

## 2012-07-11 DIAGNOSIS — D631 Anemia in chronic kidney disease: Secondary | ICD-10-CM | POA: Diagnosis not present

## 2012-07-11 DIAGNOSIS — N186 End stage renal disease: Secondary | ICD-10-CM | POA: Diagnosis not present

## 2012-07-30 DIAGNOSIS — E1129 Type 2 diabetes mellitus with other diabetic kidney complication: Secondary | ICD-10-CM | POA: Diagnosis not present

## 2012-08-08 DIAGNOSIS — N186 End stage renal disease: Secondary | ICD-10-CM | POA: Diagnosis not present

## 2012-08-11 DIAGNOSIS — D631 Anemia in chronic kidney disease: Secondary | ICD-10-CM | POA: Diagnosis not present

## 2012-08-11 DIAGNOSIS — Z992 Dependence on renal dialysis: Secondary | ICD-10-CM | POA: Diagnosis not present

## 2012-08-11 DIAGNOSIS — D509 Iron deficiency anemia, unspecified: Secondary | ICD-10-CM | POA: Diagnosis not present

## 2012-08-11 DIAGNOSIS — N186 End stage renal disease: Secondary | ICD-10-CM | POA: Diagnosis not present

## 2012-08-11 DIAGNOSIS — N2581 Secondary hyperparathyroidism of renal origin: Secondary | ICD-10-CM | POA: Diagnosis not present

## 2012-09-05 DIAGNOSIS — N186 End stage renal disease: Secondary | ICD-10-CM | POA: Diagnosis not present

## 2012-09-08 DIAGNOSIS — D509 Iron deficiency anemia, unspecified: Secondary | ICD-10-CM | POA: Diagnosis not present

## 2012-09-08 DIAGNOSIS — N186 End stage renal disease: Secondary | ICD-10-CM | POA: Diagnosis not present

## 2012-09-08 DIAGNOSIS — D631 Anemia in chronic kidney disease: Secondary | ICD-10-CM | POA: Diagnosis not present

## 2012-09-08 DIAGNOSIS — N2581 Secondary hyperparathyroidism of renal origin: Secondary | ICD-10-CM | POA: Diagnosis not present

## 2012-09-08 DIAGNOSIS — Z992 Dependence on renal dialysis: Secondary | ICD-10-CM | POA: Diagnosis not present

## 2012-09-25 DIAGNOSIS — E118 Type 2 diabetes mellitus with unspecified complications: Secondary | ICD-10-CM | POA: Diagnosis not present

## 2012-09-25 DIAGNOSIS — E7800 Pure hypercholesterolemia, unspecified: Secondary | ICD-10-CM | POA: Diagnosis not present

## 2012-09-25 DIAGNOSIS — E559 Vitamin D deficiency, unspecified: Secondary | ICD-10-CM | POA: Diagnosis not present

## 2012-09-25 DIAGNOSIS — I119 Hypertensive heart disease without heart failure: Secondary | ICD-10-CM | POA: Diagnosis not present

## 2012-10-06 DIAGNOSIS — N186 End stage renal disease: Secondary | ICD-10-CM | POA: Diagnosis not present

## 2012-10-08 DIAGNOSIS — N2581 Secondary hyperparathyroidism of renal origin: Secondary | ICD-10-CM | POA: Diagnosis not present

## 2012-10-08 DIAGNOSIS — D509 Iron deficiency anemia, unspecified: Secondary | ICD-10-CM | POA: Diagnosis not present

## 2012-10-08 DIAGNOSIS — Z992 Dependence on renal dialysis: Secondary | ICD-10-CM | POA: Diagnosis not present

## 2012-10-08 DIAGNOSIS — D631 Anemia in chronic kidney disease: Secondary | ICD-10-CM | POA: Diagnosis not present

## 2012-10-08 DIAGNOSIS — N186 End stage renal disease: Secondary | ICD-10-CM | POA: Diagnosis not present

## 2012-10-21 DIAGNOSIS — H4720 Unspecified optic atrophy: Secondary | ICD-10-CM | POA: Diagnosis not present

## 2012-10-21 DIAGNOSIS — E11359 Type 2 diabetes mellitus with proliferative diabetic retinopathy without macular edema: Secondary | ICD-10-CM | POA: Diagnosis not present

## 2012-10-21 DIAGNOSIS — E1139 Type 2 diabetes mellitus with other diabetic ophthalmic complication: Secondary | ICD-10-CM | POA: Diagnosis not present

## 2012-10-21 DIAGNOSIS — H33059 Total retinal detachment, unspecified eye: Secondary | ICD-10-CM | POA: Diagnosis not present

## 2012-10-29 DIAGNOSIS — E1129 Type 2 diabetes mellitus with other diabetic kidney complication: Secondary | ICD-10-CM | POA: Diagnosis not present

## 2012-10-31 ENCOUNTER — Other Ambulatory Visit (HOSPITAL_COMMUNITY): Payer: Self-pay | Admitting: Nephrology

## 2012-10-31 DIAGNOSIS — N186 End stage renal disease: Secondary | ICD-10-CM

## 2012-11-01 ENCOUNTER — Other Ambulatory Visit (HOSPITAL_COMMUNITY): Payer: Self-pay | Admitting: Nephrology

## 2012-11-01 ENCOUNTER — Encounter (HOSPITAL_COMMUNITY): Payer: Self-pay

## 2012-11-01 ENCOUNTER — Ambulatory Visit (HOSPITAL_COMMUNITY)
Admission: RE | Admit: 2012-11-01 | Discharge: 2012-11-01 | Disposition: A | Discharge status: Home / Self Care | Payer: Medicare Other | Source: Ambulatory Visit | Attending: Nephrology | Admitting: Nephrology

## 2012-11-01 DIAGNOSIS — I871 Compression of vein: Secondary | ICD-10-CM | POA: Insufficient documentation

## 2012-11-01 DIAGNOSIS — I120 Hypertensive chronic kidney disease with stage 5 chronic kidney disease or end stage renal disease: Secondary | ICD-10-CM | POA: Insufficient documentation

## 2012-11-01 DIAGNOSIS — E119 Type 2 diabetes mellitus without complications: Secondary | ICD-10-CM | POA: Insufficient documentation

## 2012-11-01 DIAGNOSIS — T82898A Other specified complication of vascular prosthetic devices, implants and grafts, initial encounter: Secondary | ICD-10-CM | POA: Diagnosis not present

## 2012-11-01 DIAGNOSIS — M129 Arthropathy, unspecified: Secondary | ICD-10-CM | POA: Insufficient documentation

## 2012-11-01 DIAGNOSIS — Z8673 Personal history of transient ischemic attack (TIA), and cerebral infarction without residual deficits: Secondary | ICD-10-CM | POA: Insufficient documentation

## 2012-11-01 DIAGNOSIS — H5440 Blindness, one eye, unspecified eye: Secondary | ICD-10-CM | POA: Diagnosis not present

## 2012-11-01 DIAGNOSIS — Y832 Surgical operation with anastomosis, bypass or graft as the cause of abnormal reaction of the patient, or of later complication, without mention of misadventure at the time of the procedure: Secondary | ICD-10-CM | POA: Insufficient documentation

## 2012-11-01 DIAGNOSIS — N186 End stage renal disease: Secondary | ICD-10-CM

## 2012-11-01 DIAGNOSIS — I82609 Acute embolism and thrombosis of unspecified veins of unspecified upper extremity: Secondary | ICD-10-CM | POA: Diagnosis not present

## 2012-11-01 MED ORDER — ALTEPLASE 100 MG IV SOLR
4.0000 mg | Freq: Once | INTRAVENOUS | Status: AC
  Administered 2012-11-01: 4 mg
  Filled 2012-11-01 (×2): qty 4

## 2012-11-01 MED ORDER — IOHEXOL 300 MG/ML  SOLN
100.0000 mL | Freq: Once | INTRAMUSCULAR | Status: AC | PRN
  Administered 2012-11-01: 80 mL via INTRAVENOUS

## 2012-11-01 MED ORDER — HEPARIN SODIUM (PORCINE) 1000 UNIT/ML IJ SOLN
INTRAMUSCULAR | Status: AC
  Administered 2012-11-01: 3000 [IU]
  Filled 2012-11-01: qty 1

## 2012-11-01 NOTE — Procedures (Signed)
Technically successful declot of right upper arm dialysis graft.  Procedure complicated by extravasation within the proximal humeral aspect of the main venous outflow successfully treated with deployment of overlapping covered stent grafts.  Otherwise, no immediate post procedural complications.

## 2012-11-01 NOTE — H&P (Signed)
Shawn [name] is an 52 y.o. male.   Chief Complaint: clotted rt arm dialysis graft Last use 4/25: successful This am noted no pulse Last intervention: 04/2012; successful Scheduled now for dialysis graft thrombolysis and possible angioplasty/stent Possible dialysis catheter placement if needed HPI: CVA; ESRD; HTN; DM; Blind  Past Medical History  Diagnosis Date  . Hypertension   . Stroke   . Diabetes mellitus   . Chronic kidney disease   . Arthritis   . No pertinent past medical history   . Blind right eye     Past Surgical History  Procedure Laterality Date  . Left arm graft  10/2010  . Hernia repair      No family history on file. Social History:  has no tobacco, alcohol, and drug history on file.  Allergies: No Known Allergies   (Not in [name] hospital admission)  Results for orders placed during the hospital encounter of 11/01/12 (from the past 48 hour(s))  POTASSIUM     Status: None   Collection Time    11/01/12  8:50 AM      Result Value Range   Potassium 5.1  3.5 - 5.1 mEq/L   Comment: SLIGHT HEMOLYSIS   No results found.  Review of Systems  Constitutional: Negative for fever.  Respiratory: Negative for shortness of breath.   Cardiovascular: Negative for chest pain.  Gastrointestinal: Negative for nausea and vomiting.  Musculoskeletal: Negative for back pain.  Neurological: Negative for headaches.    Blood pressure 116/65, pulse 65, temperature 98.7 F (37.1 C), temperature source Oral, resp. rate 18, SpO2 99.00%. Physical Exam  Constitutional: He is oriented to person, place, and time. He appears well-nourished.  Cardiovascular: Normal rate, regular rhythm and normal heart sounds.   No murmur heard. Respiratory: Effort normal and breath sounds normal. He has no wheezes.  GI: Soft. Bowel sounds are normal.  Musculoskeletal: Normal range of motion.  Uses cane due to CVA; unsteady gait. Blind Rt arm dialysis graft: no pulse  Neurological: He is  alert and oriented to person, place, and time.  Skin: Skin is warm and dry.  Psychiatric: He has [name] normal mood and affect. His behavior is normal. Judgment and thought content normal.     Assessment/Plan Clotted rt arm dial graft Scheduled for thrombolysis and pta/stent Poss dialysis cath placement if needed Pt aware of procedure benefits and risks and agreeable to proceed Consent signed and in chart  Shawn [name] 11/01/2012, 11:07 AM

## 2012-11-01 NOTE — ED Notes (Signed)
No sedation given per MD due to lack of transportation for patient post procedure. Patient agrees and wants to continue without moderate sedation.

## 2012-11-07 DIAGNOSIS — D509 Iron deficiency anemia, unspecified: Secondary | ICD-10-CM | POA: Diagnosis not present

## 2012-11-07 DIAGNOSIS — Z992 Dependence on renal dialysis: Secondary | ICD-10-CM | POA: Diagnosis not present

## 2012-11-07 DIAGNOSIS — N186 End stage renal disease: Secondary | ICD-10-CM | POA: Diagnosis not present

## 2012-11-07 DIAGNOSIS — N2581 Secondary hyperparathyroidism of renal origin: Secondary | ICD-10-CM | POA: Diagnosis not present

## 2012-11-13 DIAGNOSIS — H40059 Ocular hypertension, unspecified eye: Secondary | ICD-10-CM | POA: Diagnosis not present

## 2012-11-13 DIAGNOSIS — H4011X0 Primary open-angle glaucoma, stage unspecified: Secondary | ICD-10-CM | POA: Diagnosis not present

## 2012-11-13 DIAGNOSIS — E11359 Type 2 diabetes mellitus with proliferative diabetic retinopathy without macular edema: Secondary | ICD-10-CM | POA: Diagnosis not present

## 2012-11-13 DIAGNOSIS — E119 Type 2 diabetes mellitus without complications: Secondary | ICD-10-CM | POA: Diagnosis not present

## 2012-11-18 DIAGNOSIS — H4011X0 Primary open-angle glaucoma, stage unspecified: Secondary | ICD-10-CM | POA: Diagnosis not present

## 2012-11-18 DIAGNOSIS — H43819 Vitreous degeneration, unspecified eye: Secondary | ICD-10-CM | POA: Diagnosis not present

## 2012-11-18 DIAGNOSIS — H348392 Tributary (branch) retinal vein occlusion, unspecified eye, stable: Secondary | ICD-10-CM | POA: Diagnosis not present

## 2012-11-18 DIAGNOSIS — Z961 Presence of intraocular lens: Secondary | ICD-10-CM | POA: Diagnosis not present

## 2012-12-06 DIAGNOSIS — N186 End stage renal disease: Secondary | ICD-10-CM | POA: Diagnosis not present

## 2012-12-08 DIAGNOSIS — Z992 Dependence on renal dialysis: Secondary | ICD-10-CM | POA: Diagnosis not present

## 2012-12-08 DIAGNOSIS — N186 End stage renal disease: Secondary | ICD-10-CM | POA: Diagnosis not present

## 2012-12-08 DIAGNOSIS — D509 Iron deficiency anemia, unspecified: Secondary | ICD-10-CM | POA: Diagnosis not present

## 2012-12-08 DIAGNOSIS — N2581 Secondary hyperparathyroidism of renal origin: Secondary | ICD-10-CM | POA: Diagnosis not present

## 2012-12-16 DIAGNOSIS — I871 Compression of vein: Secondary | ICD-10-CM | POA: Diagnosis not present

## 2012-12-16 DIAGNOSIS — N186 End stage renal disease: Secondary | ICD-10-CM | POA: Diagnosis not present

## 2012-12-16 DIAGNOSIS — T82898A Other specified complication of vascular prosthetic devices, implants and grafts, initial encounter: Secondary | ICD-10-CM | POA: Diagnosis not present

## 2012-12-30 DIAGNOSIS — H4011X0 Primary open-angle glaucoma, stage unspecified: Secondary | ICD-10-CM | POA: Diagnosis not present

## 2013-01-05 DIAGNOSIS — N186 End stage renal disease: Secondary | ICD-10-CM | POA: Diagnosis not present

## 2013-01-07 DIAGNOSIS — N2581 Secondary hyperparathyroidism of renal origin: Secondary | ICD-10-CM | POA: Diagnosis not present

## 2013-01-07 DIAGNOSIS — D509 Iron deficiency anemia, unspecified: Secondary | ICD-10-CM | POA: Diagnosis not present

## 2013-01-07 DIAGNOSIS — Z992 Dependence on renal dialysis: Secondary | ICD-10-CM | POA: Diagnosis not present

## 2013-01-07 DIAGNOSIS — N186 End stage renal disease: Secondary | ICD-10-CM | POA: Diagnosis not present

## 2013-01-08 ENCOUNTER — Ambulatory Visit: Payer: Self-pay | Admitting: Nurse Practitioner

## 2013-01-20 DIAGNOSIS — E559 Vitamin D deficiency, unspecified: Secondary | ICD-10-CM | POA: Diagnosis not present

## 2013-01-20 DIAGNOSIS — I119 Hypertensive heart disease without heart failure: Secondary | ICD-10-CM | POA: Diagnosis not present

## 2013-01-20 DIAGNOSIS — I5022 Chronic systolic (congestive) heart failure: Secondary | ICD-10-CM | POA: Diagnosis not present

## 2013-01-27 DIAGNOSIS — E1149 Type 2 diabetes mellitus with other diabetic neurological complication: Secondary | ICD-10-CM | POA: Diagnosis not present

## 2013-01-27 DIAGNOSIS — L608 Other nail disorders: Secondary | ICD-10-CM | POA: Diagnosis not present

## 2013-01-28 DIAGNOSIS — E1129 Type 2 diabetes mellitus with other diabetic kidney complication: Secondary | ICD-10-CM | POA: Diagnosis not present

## 2013-02-02 DIAGNOSIS — N186 End stage renal disease: Secondary | ICD-10-CM | POA: Diagnosis not present

## 2013-02-02 DIAGNOSIS — I871 Compression of vein: Secondary | ICD-10-CM | POA: Diagnosis not present

## 2013-02-02 DIAGNOSIS — T82898A Other specified complication of vascular prosthetic devices, implants and grafts, initial encounter: Secondary | ICD-10-CM | POA: Diagnosis not present

## 2013-02-05 DIAGNOSIS — N186 End stage renal disease: Secondary | ICD-10-CM | POA: Diagnosis not present

## 2013-02-06 DIAGNOSIS — D509 Iron deficiency anemia, unspecified: Secondary | ICD-10-CM | POA: Diagnosis not present

## 2013-02-06 DIAGNOSIS — Z992 Dependence on renal dialysis: Secondary | ICD-10-CM | POA: Diagnosis not present

## 2013-02-06 DIAGNOSIS — N2581 Secondary hyperparathyroidism of renal origin: Secondary | ICD-10-CM | POA: Diagnosis not present

## 2013-02-06 DIAGNOSIS — N186 End stage renal disease: Secondary | ICD-10-CM | POA: Diagnosis not present

## 2013-02-13 DIAGNOSIS — N186 End stage renal disease: Secondary | ICD-10-CM | POA: Diagnosis not present

## 2013-02-13 DIAGNOSIS — I871 Compression of vein: Secondary | ICD-10-CM | POA: Diagnosis not present

## 2013-02-13 DIAGNOSIS — T82898A Other specified complication of vascular prosthetic devices, implants and grafts, initial encounter: Secondary | ICD-10-CM | POA: Diagnosis not present

## 2013-03-08 DIAGNOSIS — N186 End stage renal disease: Secondary | ICD-10-CM | POA: Diagnosis not present

## 2013-03-09 DIAGNOSIS — N186 End stage renal disease: Secondary | ICD-10-CM | POA: Diagnosis not present

## 2013-03-09 DIAGNOSIS — D509 Iron deficiency anemia, unspecified: Secondary | ICD-10-CM | POA: Diagnosis not present

## 2013-03-09 DIAGNOSIS — N2581 Secondary hyperparathyroidism of renal origin: Secondary | ICD-10-CM | POA: Diagnosis not present

## 2013-03-09 DIAGNOSIS — Z23 Encounter for immunization: Secondary | ICD-10-CM | POA: Diagnosis not present

## 2013-03-09 DIAGNOSIS — Z992 Dependence on renal dialysis: Secondary | ICD-10-CM | POA: Diagnosis not present

## 2013-03-24 DIAGNOSIS — I871 Compression of vein: Secondary | ICD-10-CM | POA: Diagnosis not present

## 2013-03-24 DIAGNOSIS — T82898A Other specified complication of vascular prosthetic devices, implants and grafts, initial encounter: Secondary | ICD-10-CM | POA: Diagnosis not present

## 2013-03-24 DIAGNOSIS — N186 End stage renal disease: Secondary | ICD-10-CM | POA: Diagnosis not present

## 2013-04-01 ENCOUNTER — Encounter: Payer: Self-pay | Admitting: Nurse Practitioner

## 2013-04-02 ENCOUNTER — Ambulatory Visit (INDEPENDENT_AMBULATORY_CARE_PROVIDER_SITE_OTHER): Payer: Medicare Other | Admitting: Nurse Practitioner

## 2013-04-02 ENCOUNTER — Encounter: Payer: Self-pay | Admitting: Nurse Practitioner

## 2013-04-02 VITALS — BP 110/72 | HR 86 | Ht 71.0 in | Wt 204.0 lb

## 2013-04-02 DIAGNOSIS — N185 Chronic kidney disease, stage 5: Secondary | ICD-10-CM | POA: Insufficient documentation

## 2013-04-02 DIAGNOSIS — I6350 Cerebral infarction due to unspecified occlusion or stenosis of unspecified cerebral artery: Secondary | ICD-10-CM | POA: Diagnosis not present

## 2013-04-02 DIAGNOSIS — R42 Dizziness and giddiness: Secondary | ICD-10-CM

## 2013-04-02 HISTORY — DX: Cerebral infarction due to unspecified occlusion or stenosis of unspecified cerebral artery: I63.50

## 2013-04-02 NOTE — Progress Notes (Addendum)
[facility]  PATIENT: Shawn [name] DOB: 01-23-1961   REASON FOR VISIT: Followup for stroke   HISTORY OF PRESENT ILLNESS: Shawn [name], 52 year old black male returns for followup. He has a history of stroke which occurred in April 2012. He is also on dialysis 3 times weekly. His shunt clotted after his dialysis yesterday. Blood pressure is stable today. Patient claims that his blood pressure medication was decreased he has had less orthostasis. He denies further stroke or TIA symptoms. 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 He is ambulating with a cane and denies any fallsTCD study 06/10/12 is consistent with abnormally elevated CBFV's through the majority arteries of the ant and post circulation. The cause of this finding could be multi-focal stenotic narrowing of cerebral vessels vs effects of systemic factors like anemia. Carotid doppler is negative for significant stenosis.      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 The patient presented with gait instability, vertigo and dizziness, which have resolved completely, except the patient still has some gait instability. But he can walk pretty well with a cane. 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 The patient had cataract surgery and retinal  repair on both of his eyes before the stroke. The patient has a PMH of end stage renal disease, and goes to dialysis 3x a week. He  Is seen urgently today following recent visit to [facility] emergency room on 03/04/12 for dizziness. He woke up that day feeling dizzy and off-balance and with a sensation of body being pulled to the right side. This was noticed mainly when he was in the upright position and he felt better when he sat down or lay down. He denied any vertigo, nausea, diplopia. He  did have some minor tingling in his right hand. His blood pressure was recorded as 133/85 in the ER but they did not check orthostatics. CBC and BMP were unremarkable. Patient states he had similar but milder episode today's later following dialysis when his blood pressure was recorded being low in the 80s. Similarly symptoms were noticeable when he was upright and improved when he lays flat. He  complains of difficulty walking and exercising particularly on the days he gets dialysis as he feels quite tired. He had lipid profile checked last month by Shawn [name] and LDL was elevated but he cannot give me the exact numbers and I do not have those results. He continues to have poor vision  annd uses a cane to walk.   REVIEW OF SYSTEMS: Full 14 system review of systems performed and notable only for:  Constitutional: N/A  Cardiovascular: N/A  Ear/Nose/Throat: N/A  Skin: N/A  Eyes: N/A  Respiratory: N/A  Gastroitestinal: N/A  Hematology/Lymphatic: N/A  Endocrine: N/A Musculoskeletal:N/A  Allergy/Immunology: N/A  Neurological: N/A Psychiatric: N/A   ALLERGIES: No Known Allergies  HOME MEDICATIONS: Outpatient Prescriptions Prior to Visit  Medication Sig Dispense Refill  . aspirin 325 MG tablet Take 325 mg by mouth daily.      Marland Kitchen atorvastatin (LIPITOR) 40 MG tablet Take 40 mg by mouth daily.      . B Complex Vitamins (B COMPLEX-B12) TABS Take 1 tablet by mouth daily.      . colesevelam (WELCHOL) 625 MG tablet Take 1,250 mg by mouth daily with  supper.      . folic acid-vitamin b complex-vitamin c-selenium-zinc (DIALYVITE) 3 MG TABS Take 1 tablet by mouth daily.      . insulin NPH-insulin regular (NOVOLIN 70/30) (70-30) 100 UNIT/ML injection Inject 15 Units into the skin 2 (two) times daily with a meal.      . lisinopril (PRINIVIL,ZESTRIL) 10 MG tablet Take 10 mg by mouth daily. Standing bp of 110 or more       No facility-administered medications prior to visit.    PAST MEDICAL  HISTORY: Past Medical History  Diagnosis Date  . Hypertension   . Stroke   . Diabetes mellitus   . Chronic kidney disease   . Arthritis   . No pertinent past medical history   . Blind right eye   . Unspecified cerebral artery occlusion with cerebral infarction 04/02/2013    PAST SURGICAL HISTORY: Past Surgical History  Procedure Laterality Date  . Left arm graft  10/2010  . Hernia repair      FAMILY HISTORY: Family History  Problem Relation Age of Onset  . Cancer Sister   . Stroke Sister     SOCIAL HISTORY: History   Social History  . Marital Status: Married    Spouse Name: N/A    Number of Children: 0  . Years of Education: 12th   Occupational History  .     Social History Main Topics  . Smoking status: Never Smoker   . Smokeless tobacco: Never Used  . Alcohol Use: No  . Drug Use: No  . Sexual Activity: Not on file   Other Topics Concern  . Not on file   Social History Narrative   Patient lives at home with wife.    Patient is disabled.    Patient has no children.    Patient has a 12 grade education.            PHYSICAL EXAM  Filed Vitals:   04/02/13 1059 04/02/13 1100  BP: 129/75 110/72  Pulse: 76 86  Height: 5\' 11"  (1.803 m)   Weight: 204 lb (92.534 kg)    Body mass index is 28.46 kg/(m^2).  Generalized: Well developed, in no acute distress  Head: normocephalic and atraumatic,. Oropharynx benign  Neck: Supple, no carotid bruits  Cardiac: Regular rate rhythm, no murmur    Neurological examination   Mentation: Alert oriented to time, place, history taking. Follows all commands speech and language fluent  Cranial nerve II-XII: Pupils are enlarged right greater than left and neither reacts. Conjugate movements are restricted on the left just moving medial on the left and no movement on the right,  visual fields are restricted to confrontation on the left visual field test central and medial, vision field lateral lost.  Right full vision  loss. Scars noted on the left fundus, hemorrhages and cotton wool spots visible on the right Facial sensation and strength were normal. hearing was intact to finger rubbing bilaterally. Uvula tongue midline. head turning and shoulder shrug and were normal and symmetric.Tongue protrusion into cheek strength was normal. Motor: normal bulk and tone, full strength in the BUE, BLE, fine finger movements normal, no pronator drift. No focal weakness Sensory: normal and symmetric to light touch, pinprick, and  vibration  Coordination: finger-nose-finger, heel-to-shin bilaterally, no dysmetria Reflexes: 1+ and symmetric upper and lower. Gait and Station: Wide-based gait with cane, able to stand on heels and toes, unsteady with tandem   DIAGNOSTIC DATA (LABS, IMAGING, TESTING) -None to review  ASSESSMENT AND PLAN  51 y.o. year old male  has a past medical history of Hypertension; Stroke; Diabetes mellitus; Chronic kidney disease; Arthritis; No pertinent past medical history; Blind right eye; and Unspecified cerebral artery occlusion with cerebral infarction (04/02/2013). here for followup  Aspirin 325 for secondary stroke prevention Blood pressure in good control today Continue to monitor other risk factors diabetes mellitus and cholesterol Followup in 6-8 months Dennie Bible, [facility], [facility], APRN  [facility] [address] [place], [place] [address] [phone]  I reviewed the above note and documentation by the Nurse Practitioner and agree with the history, physical exam, assessment and plan as outlined above. In addition, I would like to suggest screening the patient for OSA if not already done.   Star Age, MD, PhD [facility])

## 2013-04-02 NOTE — Patient Instructions (Addendum)
Aspirin 325 for secondary stroke prevention Blood pressure in good control today Continue to monitor other risk factors diabetes mellitus and cholesterol Followup in 6-8 months

## 2013-04-03 DIAGNOSIS — I871 Compression of vein: Secondary | ICD-10-CM | POA: Diagnosis not present

## 2013-04-03 DIAGNOSIS — T82898A Other specified complication of vascular prosthetic devices, implants and grafts, initial encounter: Secondary | ICD-10-CM | POA: Diagnosis not present

## 2013-04-03 DIAGNOSIS — N186 End stage renal disease: Secondary | ICD-10-CM | POA: Diagnosis not present

## 2013-04-07 DIAGNOSIS — E11359 Type 2 diabetes mellitus with proliferative diabetic retinopathy without macular edema: Secondary | ICD-10-CM | POA: Diagnosis not present

## 2013-04-07 DIAGNOSIS — Z961 Presence of intraocular lens: Secondary | ICD-10-CM | POA: Diagnosis not present

## 2013-04-07 DIAGNOSIS — H26499 Other secondary cataract, unspecified eye: Secondary | ICD-10-CM | POA: Diagnosis not present

## 2013-04-07 DIAGNOSIS — E1139 Type 2 diabetes mellitus with other diabetic ophthalmic complication: Secondary | ICD-10-CM | POA: Diagnosis not present

## 2013-04-07 DIAGNOSIS — H4720 Unspecified optic atrophy: Secondary | ICD-10-CM | POA: Diagnosis not present

## 2013-04-07 DIAGNOSIS — N186 End stage renal disease: Secondary | ICD-10-CM | POA: Diagnosis not present

## 2013-04-08 DIAGNOSIS — N2581 Secondary hyperparathyroidism of renal origin: Secondary | ICD-10-CM | POA: Diagnosis not present

## 2013-04-08 DIAGNOSIS — D509 Iron deficiency anemia, unspecified: Secondary | ICD-10-CM | POA: Diagnosis not present

## 2013-04-08 DIAGNOSIS — Z992 Dependence on renal dialysis: Secondary | ICD-10-CM | POA: Diagnosis not present

## 2013-04-08 DIAGNOSIS — D631 Anemia in chronic kidney disease: Secondary | ICD-10-CM | POA: Diagnosis not present

## 2013-04-08 DIAGNOSIS — N186 End stage renal disease: Secondary | ICD-10-CM | POA: Diagnosis not present

## 2013-04-09 ENCOUNTER — Encounter: Payer: Self-pay | Admitting: Vascular Surgery

## 2013-04-09 DIAGNOSIS — I871 Compression of vein: Secondary | ICD-10-CM | POA: Diagnosis not present

## 2013-04-09 DIAGNOSIS — T82898A Other specified complication of vascular prosthetic devices, implants and grafts, initial encounter: Secondary | ICD-10-CM | POA: Diagnosis not present

## 2013-04-09 DIAGNOSIS — N186 End stage renal disease: Secondary | ICD-10-CM | POA: Diagnosis not present

## 2013-04-10 ENCOUNTER — Ambulatory Visit: Payer: Medicare Other | Admitting: Vascular Surgery

## 2013-04-14 DIAGNOSIS — I871 Compression of vein: Secondary | ICD-10-CM | POA: Diagnosis not present

## 2013-04-14 DIAGNOSIS — T82898A Other specified complication of vascular prosthetic devices, implants and grafts, initial encounter: Secondary | ICD-10-CM | POA: Diagnosis not present

## 2013-04-14 DIAGNOSIS — D689 Coagulation defect, unspecified: Secondary | ICD-10-CM | POA: Insufficient documentation

## 2013-04-14 DIAGNOSIS — N186 End stage renal disease: Secondary | ICD-10-CM | POA: Diagnosis not present

## 2013-04-16 ENCOUNTER — Encounter: Payer: Self-pay | Admitting: Vascular Surgery

## 2013-04-17 ENCOUNTER — Encounter: Payer: Self-pay | Admitting: Vascular Surgery

## 2013-04-17 ENCOUNTER — Ambulatory Visit (INDEPENDENT_AMBULATORY_CARE_PROVIDER_SITE_OTHER): Payer: Medicare Other | Admitting: Vascular Surgery

## 2013-04-17 VITALS — BP 113/72 | HR 82 | Resp 16 | Ht 71.0 in | Wt 208.0 lb

## 2013-04-17 DIAGNOSIS — N186 End stage renal disease: Secondary | ICD-10-CM | POA: Diagnosis not present

## 2013-04-17 NOTE — Progress Notes (Signed)
[facility] [place]  Established Dialysis Access  History of Present Illness  Shawn [name] is a 52 y.o. (28-Sep-1960) male who presents for re-evaluation of RUA AVG.  The patient is right hand dominant.  Previous access procedures have been completed in the right arm.  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  The patient has never had a previous PPM placed.  His RUA AVG has required thrombolysis every two weeks and recently reoccluded.  A RIJ TDC was subsequently placed.  Past Medical History  Diagnosis Date  . Hypertension   . Stroke   . Diabetes mellitus   . Chronic kidney disease   . Arthritis   . No pertinent past medical history   . Blind right eye   . 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 Left arm graft  10/2010  . Hernia repair      History   Social History  . Marital Status: Married    Spouse Name: N/A    Number of Children: 0  . Years of Education: 12th   Occupational History  .     Social History Main Topics  . Smoking status: Never Smoker   . Smokeless tobacco: Never Used  . Alcohol Use: No  . Drug Use: No  . Sexual Activity: Not on file   Other Topics Concern  . Not on file   Social History Narrative   Patient lives at home with wife.    Patient is disabled.    Patient has no children.    Patient has a 12 grade education.           Family History  Problem Relation Age of Onset  . Cancer Sister   . Stroke Sister   . Diabetes Mother     Current Outpatient Prescriptions on File Prior to Visit  Medication Sig Dispense Refill  . aspirin 325 MG tablet Take 325 mg by mouth daily.      . B Complex Vitamins (B COMPLEX-B12) TABS Take 1 tablet by mouth daily.      . colesevelam (WELCHOL) 625 MG tablet Take 1,250 mg by mouth daily with supper.      . insulin  NPH-insulin regular (NOVOLIN 70/30) (70-30) 100 UNIT/ML injection Inject 15 Units into the skin 2 (two) times daily with a meal.      . lisinopril (PRINIVIL,ZESTRIL) 10 MG tablet Take 5 mg by mouth daily. Standing bp of 110 or more      . atorvastatin (LIPITOR) 40 MG tablet Take 40 mg by mouth daily.      . folic acid-vitamin b complex-vitamin c-selenium-zinc (DIALYVITE) 3 MG TABS Take 1 tablet by mouth daily.       No current facility-administered medications on file prior to visit.    No Known Allergies  REVIEW OF SYSTEMS:  (Positives checked otherwise negative)  CARDIOVASCULAR:  []  chest pain, []  chest pressure, []  palpitations, []  shortness of breath when laying flat, []  shortness of breath with exertion,  []  pain in feet when walking, []  pain in feet when laying flat, []  history of blood clot in veins (DVT), []  history of phlebitis, []  swelling in legs, []  varicose veins  PULMONARY:  []  productive cough, []  asthma, []  wheezing  NEUROLOGIC:  []  weakness in arms or legs, []  numbness in arms or legs, []  difficulty speaking or slurred speech, []   temporary loss of vision in one eye, []  dizziness  HEMATOLOGIC:  []  bleeding problems, []  problems with blood clotting too easily  MUSCULOSKEL:  []  joint pain, []  joint swelling  GASTROINTEST:  []  vomiting blood, []  blood in stool     GENITOURINARY:  []  burning with urination, []  blood in urine  PSYCHIATRIC:  []  history of major depression  INTEGUMENTARY:  []  rashes, []  ulcers  Physical Examination  Filed Vitals:   04/17/13 1221  BP: 113/72  Pulse: 82  Resp: 16  Height: 5\' 11"  (1.803 m)  Weight: 208 lb (94.348 kg)   Body mass index is 29.02 kg/(m^2).  General: A&O x 3, WD, WN  Pulmonary: Sym exp, good air movt, CTAB, no rales, rhonchi, & wheezing  Cardiac: RRR, Nl S1, S2, no Murmurs, rubs or gallops  Vascular: Vessel Right Left  Radial Not Palpable Faintly Palpable  Ulnar Not Palpable Not Palpable  Brachial Palpable  Faintly Palpable   Gastrointestinal: soft, NTND, -G/R, - HSM, - masses, - CVAT B  Musculoskeletal: M/S 5/5 throughout , Extremities without  ischemic changes , no palpable thrill in access in RUA AVG, No bruit in access, incision extends into proximal axilla, firm cord palpable in axilla  Neurologic: Pain and light touch intact in extremities , Motor exam as listed above  Outside Studies/Documentation 12 pages of outside documents were reviewed including: extensive nephrology records  Medical Decision Making  Shawn [name] is a 52 y.o. male who presents with ESRD requiring hemodialysis and thrombosed RUA AVG.   I reviewed his prior shuntogram and there are obvious stents within this graft.  Its not clear to me if these are the palpable cord in the axilla.  As his left arm has had steal, his options are limited on that side.  Further evaluation including possible left arm angiogram might be necessary if additional access options in right arm are not possible.  I think it is reasonable to attempt a right axillary exploration and possible hybrid AVG placement.  If the proximal axillary vein is patent, a hybrid graft might be usable.  I doubt a tradition AVG is possible given the high extent of the prior AVG.   Risk, benefits, and alternatives to access surgery were discussed.  The patient is aware the risks include but are not limited to: bleeding, infection, steal syndrome, nerve damage, ischemic monomelic neuropathy, failure to mature, need for additional procedures, death and stroke.   The patient agrees to proceed forward with the procedure. As the patient is on HD on M-W-F, I will need to schedule him T-R.  I might be able to get him on the schedule on a Thurs but due to scheduling conflicts in both the patient's schedule and mine, the earliest Tuesday I can schedule him in the 3 NOV 14.  Adele Barthel, MD [facility] [place] Office: [phone] Pager:  [phone]  04/17/2013, 1:37 PM

## 2013-04-21 ENCOUNTER — Encounter: Payer: Self-pay | Admitting: Nephrology

## 2013-04-21 DIAGNOSIS — H35379 Puckering of macula, unspecified eye: Secondary | ICD-10-CM | POA: Diagnosis not present

## 2013-04-21 DIAGNOSIS — E1139 Type 2 diabetes mellitus with other diabetic ophthalmic complication: Secondary | ICD-10-CM | POA: Diagnosis not present

## 2013-04-21 DIAGNOSIS — E11359 Type 2 diabetes mellitus with proliferative diabetic retinopathy without macular edema: Secondary | ICD-10-CM | POA: Diagnosis not present

## 2013-04-28 ENCOUNTER — Ambulatory Visit (INDEPENDENT_AMBULATORY_CARE_PROVIDER_SITE_OTHER): Payer: Medicare Other

## 2013-04-28 VITALS — BP 144/68 | HR 75 | Resp 20 | Ht 71.0 in | Wt 195.0 lb

## 2013-04-28 DIAGNOSIS — L608 Other nail disorders: Secondary | ICD-10-CM

## 2013-04-28 DIAGNOSIS — E1149 Type 2 diabetes mellitus with other diabetic neurological complication: Secondary | ICD-10-CM

## 2013-04-28 DIAGNOSIS — Q828 Other specified congenital malformations of skin: Secondary | ICD-10-CM

## 2013-04-28 DIAGNOSIS — E1142 Type 2 diabetes mellitus with diabetic polyneuropathy: Secondary | ICD-10-CM

## 2013-04-28 DIAGNOSIS — E1140 Type 2 diabetes mellitus with diabetic neuropathy, unspecified: Secondary | ICD-10-CM

## 2013-04-28 NOTE — Progress Notes (Signed)
  Subjective:    Patient ID: Yaakov Seppala, male    DOB: [birth year], 52 y.o.   MRN: [id number]  HPI "Just a toenail trim if I'm not mistaken." Patient presents for diabetic foot and nail care at this time. No changes in health or medication history since last visit nails thick friable discolored and brittle with history of diabetes and peripheral neuropathy there is also pinch callus first MTP area left foot. No open wounds ulcerations noted.    Review of Systems not performed at today's visit.     Objective:   Physical Exam Neurovascular status as follows pedal pulses DP postal for PT pulse one over 4 bilateral skin temperature warm and lateral turgor normal. No edema. No varicosities noted. Epicritic sensations diminished on Semmes Weinstein testing bilateral to the toes and forefoot bilateral orthopedic exam unremarkable rectus foot type mild flexible digital contractures noted. Neurologically epicritic sensation diminished on Semmes Weinstein       Assessment & Plan:  Diabetes with peripheral neuropathy, dystrophic trouble mycotic nails x10 debridement present diabetes cocking factors. Still keratotic lesion pinch callus first MTP area left is also debridement this time. Return for follow care in 3 months next  Harriet Masson DPM

## 2013-04-28 NOTE — Patient Instructions (Signed)

## 2013-04-29 DIAGNOSIS — E1129 Type 2 diabetes mellitus with other diabetic kidney complication: Secondary | ICD-10-CM | POA: Diagnosis not present

## 2013-04-30 ENCOUNTER — Ambulatory Visit: Payer: Medicare Other | Admitting: Vascular Surgery

## 2013-05-06 ENCOUNTER — Other Ambulatory Visit: Payer: Self-pay

## 2013-05-07 ENCOUNTER — Encounter (HOSPITAL_COMMUNITY): Payer: Self-pay | Admitting: Pharmacy Technician

## 2013-05-08 DIAGNOSIS — N186 End stage renal disease: Secondary | ICD-10-CM | POA: Diagnosis not present

## 2013-05-11 ENCOUNTER — Other Ambulatory Visit (HOSPITAL_COMMUNITY): Payer: Self-pay | Admitting: *Deleted

## 2013-05-11 ENCOUNTER — Encounter (HOSPITAL_COMMUNITY): Payer: Self-pay | Admitting: *Deleted

## 2013-05-11 DIAGNOSIS — D631 Anemia in chronic kidney disease: Secondary | ICD-10-CM | POA: Diagnosis not present

## 2013-05-11 DIAGNOSIS — D509 Iron deficiency anemia, unspecified: Secondary | ICD-10-CM | POA: Diagnosis not present

## 2013-05-11 DIAGNOSIS — N186 End stage renal disease: Secondary | ICD-10-CM | POA: Diagnosis not present

## 2013-05-11 DIAGNOSIS — Z992 Dependence on renal dialysis: Secondary | ICD-10-CM | POA: Diagnosis not present

## 2013-05-11 DIAGNOSIS — N2581 Secondary hyperparathyroidism of renal origin: Secondary | ICD-10-CM | POA: Diagnosis not present

## 2013-05-11 MED ORDER — DEXTROSE 5 % IV SOLN
1.5000 g | INTRAVENOUS | Status: AC
  Administered 2013-05-12: 1.5 g via INTRAVENOUS
  Filled 2013-05-11 (×2): qty 1.5

## 2013-05-12 ENCOUNTER — Ambulatory Visit (HOSPITAL_COMMUNITY)
Admission: RE | Admit: 2013-05-12 | Discharge: 2013-05-12 | Disposition: A | Discharge status: Home / Self Care | Payer: Medicare Other | Source: Ambulatory Visit | Attending: Vascular Surgery | Admitting: Vascular Surgery

## 2013-05-12 ENCOUNTER — Ambulatory Visit (HOSPITAL_COMMUNITY): Payer: Medicare Other | Admitting: Certified Registered"

## 2013-05-12 ENCOUNTER — Encounter (HOSPITAL_COMMUNITY): Payer: Medicare Other | Admitting: Certified Registered"

## 2013-05-12 ENCOUNTER — Encounter (HOSPITAL_COMMUNITY): Payer: Self-pay | Admitting: *Deleted

## 2013-05-12 ENCOUNTER — Telehealth: Payer: Self-pay | Admitting: Vascular Surgery

## 2013-05-12 ENCOUNTER — Ambulatory Visit (HOSPITAL_COMMUNITY): Payer: Medicare Other

## 2013-05-12 ENCOUNTER — Encounter (HOSPITAL_COMMUNITY)
Admission: RE | Disposition: A | Discharge status: Home / Self Care | Payer: Self-pay | Source: Ambulatory Visit | Attending: Vascular Surgery

## 2013-05-12 DIAGNOSIS — Z992 Dependence on renal dialysis: Secondary | ICD-10-CM | POA: Insufficient documentation

## 2013-05-12 DIAGNOSIS — T82898A Other specified complication of vascular prosthetic devices, implants and grafts, initial encounter: Secondary | ICD-10-CM | POA: Insufficient documentation

## 2013-05-12 DIAGNOSIS — I120 Hypertensive chronic kidney disease with stage 5 chronic kidney disease or end stage renal disease: Secondary | ICD-10-CM | POA: Insufficient documentation

## 2013-05-12 DIAGNOSIS — E119 Type 2 diabetes mellitus without complications: Secondary | ICD-10-CM | POA: Diagnosis not present

## 2013-05-12 DIAGNOSIS — N186 End stage renal disease: Secondary | ICD-10-CM | POA: Insufficient documentation

## 2013-05-12 DIAGNOSIS — Z01818 Encounter for other preprocedural examination: Secondary | ICD-10-CM | POA: Diagnosis not present

## 2013-05-12 DIAGNOSIS — I10 Essential (primary) hypertension: Secondary | ICD-10-CM | POA: Diagnosis not present

## 2013-05-12 DIAGNOSIS — Z79899 Other long term (current) drug therapy: Secondary | ICD-10-CM | POA: Insufficient documentation

## 2013-05-12 DIAGNOSIS — Y832 Surgical operation with anastomosis, bypass or graft as the cause of abnormal reaction of the patient, or of later complication, without mention of misadventure at the time of the procedure: Secondary | ICD-10-CM | POA: Insufficient documentation

## 2013-05-12 HISTORY — PX: AV FISTULA PLACEMENT: SHX1204

## 2013-05-12 LAB — POCT I-STAT 4, (NA,K, GLUC, HGB,HCT)
Glucose, Bld: 117 mg/dL — ABNORMAL HIGH (ref 70–99)
Hemoglobin: 11.9 g/dL — ABNORMAL LOW (ref 13.0–17.0)
Potassium: 3.7 mEq/L (ref 3.5–5.1)

## 2013-05-12 LAB — GLUCOSE, CAPILLARY: Glucose-Capillary: 124 mg/dL — ABNORMAL HIGH (ref 70–99)

## 2013-05-12 SURGERY — INSERTION OF ARTERIOVENOUS (AV) GORE-TEX GRAFT ARM
Anesthesia: General | Site: Arm Upper | Laterality: Right | Wound class: Clean

## 2013-05-12 MED ORDER — LIDOCAINE HCL (CARDIAC) 20 MG/ML IV SOLN
INTRAVENOUS | Status: DC | PRN
  Administered 2013-05-12: 100 mg via INTRAVENOUS

## 2013-05-12 MED ORDER — SODIUM CHLORIDE 0.9 % IV SOLN
INTRAVENOUS | Status: DC | PRN
  Administered 2013-05-12: 07:00:00 via INTRAVENOUS

## 2013-05-12 MED ORDER — SODIUM CHLORIDE 0.9 % IR SOLN
Status: DC | PRN
  Administered 2013-05-12: 07:00:00

## 2013-05-12 MED ORDER — THROMBIN 20000 UNITS EX SOLR: CUTANEOUS | Status: DC | PRN

## 2013-05-12 MED ORDER — OXYCODONE HCL 5 MG PO TABS
ORAL_TABLET | ORAL | Status: AC
  Filled 2013-05-12: qty 1

## 2013-05-12 MED ORDER — THROMBIN 20000 UNITS EX SOLR
CUTANEOUS | Status: AC
  Filled 2013-05-12: qty 20000

## 2013-05-12 MED ORDER — PROPOFOL 10 MG/ML IV BOLUS
INTRAVENOUS | Status: DC | PRN
  Administered 2013-05-12: 200 mg via INTRAVENOUS

## 2013-05-12 MED ORDER — ONDANSETRON HCL 4 MG/2ML IJ SOLN: 4.0000 mg | Freq: Once | INTRAMUSCULAR | Status: DC | PRN

## 2013-05-12 MED ORDER — OXYCODONE HCL 5 MG PO TABS
5.0000 mg | ORAL_TABLET | Freq: Once | ORAL | Status: AC | PRN
  Administered 2013-05-12: 5 mg via ORAL

## 2013-05-12 MED ORDER — ONDANSETRON HCL 4 MG/2ML IJ SOLN
INTRAMUSCULAR | Status: DC | PRN
  Administered 2013-05-12: 4 mg via INTRAVENOUS

## 2013-05-12 MED ORDER — PHENYLEPHRINE HCL 10 MG/ML IJ SOLN
10.0000 mg | INTRAVENOUS | Status: DC | PRN
  Administered 2013-05-12: 30 ug/min via INTRAVENOUS

## 2013-05-12 MED ORDER — HYDROMORPHONE HCL PF 1 MG/ML IJ SOLN: 0.2500 mg | INTRAMUSCULAR | Status: DC | PRN

## 2013-05-12 MED ORDER — SODIUM CHLORIDE 0.9 % IV SOLN: INTRAVENOUS | Status: DC

## 2013-05-12 MED ORDER — OXYCODONE HCL 5 MG PO TABS: 5.0000 mg | ORAL_TABLET | ORAL | Status: DC | PRN

## 2013-05-12 MED ORDER — THROMBIN 20000 UNITS EX KIT
PACK | CUTANEOUS | Status: DC | PRN
  Administered 2013-05-12: 09:00:00 via TOPICAL

## 2013-05-12 MED ORDER — HEPARIN SODIUM (PORCINE) 1000 UNIT/ML IJ SOLN
INTRAMUSCULAR | Status: DC | PRN
  Administered 2013-05-12: 7000 [IU] via INTRAVENOUS

## 2013-05-12 MED ORDER — THROMBIN 20000 UNITS EX KIT: PACK | CUTANEOUS | Status: DC | PRN

## 2013-05-12 MED ORDER — MIDAZOLAM HCL 5 MG/5ML IJ SOLN
INTRAMUSCULAR | Status: DC | PRN
  Administered 2013-05-12: 2 mg via INTRAVENOUS

## 2013-05-12 MED ORDER — OXYCODONE HCL 5 MG/5ML PO SOLN: 5.0000 mg | Freq: Once | ORAL | Status: AC | PRN

## 2013-05-12 MED ORDER — 0.9 % SODIUM CHLORIDE (POUR BTL) OPTIME
TOPICAL | Status: DC | PRN
  Administered 2013-05-12: 1000 mL

## 2013-05-12 MED ORDER — FENTANYL CITRATE 0.05 MG/ML IJ SOLN
INTRAMUSCULAR | Status: DC | PRN
  Administered 2013-05-12: 100 ug via INTRAVENOUS

## 2013-05-12 MED ORDER — PHENYLEPHRINE HCL 10 MG/ML IJ SOLN
INTRAMUSCULAR | Status: DC | PRN
  Administered 2013-05-12: 80 ug via INTRAVENOUS
  Administered 2013-05-12: 160 ug via INTRAVENOUS
  Administered 2013-05-12 (×2): 80 ug via INTRAVENOUS

## 2013-05-12 SURGICAL SUPPLY — 44 items
ADH SKN CLS APL DERMABOND .7 (GAUZE/BANDAGES/DRESSINGS) ×1
ARMBAND PINK RESTRICT EXTREMIT (MISCELLANEOUS) ×2 IMPLANT
BLADE SURG ROTATE 9660 (MISCELLANEOUS) ×1 IMPLANT
CANISTER SUCTION 2500CC (MISCELLANEOUS) ×2 IMPLANT
CLIP TI MEDIUM 6 (CLIP) ×2 IMPLANT
CLIP TI WIDE RED SMALL 6 (CLIP) ×2 IMPLANT
COVER PROBE W GEL 5X96 (DRAPES) ×1 IMPLANT
COVER SURGICAL LIGHT HANDLE (MISCELLANEOUS) ×2 IMPLANT
DECANTER SPIKE VIAL GLASS SM (MISCELLANEOUS) IMPLANT
DERMABOND ADVANCED (GAUZE/BANDAGES/DRESSINGS) ×1
DERMABOND ADVANCED .7 DNX12 (GAUZE/BANDAGES/DRESSINGS) ×1 IMPLANT
ELECT REM PT RETURN 9FT ADLT (ELECTROSURGICAL) ×2
ELECTRODE REM PT RTRN 9FT ADLT (ELECTROSURGICAL) ×1 IMPLANT
GLOVE BIO SURGEON STRL SZ7 (GLOVE) ×2 IMPLANT
GLOVE BIO SURGEON STRL SZ7.5 (GLOVE) ×1 IMPLANT
GLOVE BIOGEL PI IND STRL 7.0 (GLOVE) IMPLANT
GLOVE BIOGEL PI IND STRL 7.5 (GLOVE) ×1 IMPLANT
GLOVE BIOGEL PI INDICATOR 7.0 (GLOVE) ×1
GLOVE BIOGEL PI INDICATOR 7.5 (GLOVE) ×1
GLOVE ECLIPSE 7.5 STRL STRAW (GLOVE) ×1 IMPLANT
GLOVE SS BIOGEL STRL SZ 6.5 (GLOVE) IMPLANT
GLOVE SUPERSENSE BIOGEL SZ 6.5 (GLOVE) ×1
GOWN STRL NON-REIN LRG LVL3 (GOWN DISPOSABLE) ×6 IMPLANT
GOWN STRL REIN XL XLG (GOWN DISPOSABLE) ×2 IMPLANT
GRAFT GORETEX STRT 4-7X45 (Vascular Products) ×1 IMPLANT
KIT BASIN OR (CUSTOM PROCEDURE TRAY) ×2 IMPLANT
KIT ROOM TURNOVER OR (KITS) ×2 IMPLANT
NDL HYPO 25GX1X1/2 BEV (NEEDLE) ×1 IMPLANT
NEEDLE HYPO 25GX1X1/2 BEV (NEEDLE) ×2 IMPLANT
NS IRRIG 1000ML POUR BTL (IV SOLUTION) ×2 IMPLANT
PACK CV ACCESS (CUSTOM PROCEDURE TRAY) ×2 IMPLANT
PAD ARMBOARD 7.5X6 YLW CONV (MISCELLANEOUS) ×4 IMPLANT
SPONGE SURGIFOAM ABS GEL 100 (HEMOSTASIS) ×1 IMPLANT
SUT MNCRL AB 4-0 PS2 18 (SUTURE) ×3 IMPLANT
SUT PROLENE 5 0 C 1 24 (SUTURE) IMPLANT
SUT PROLENE 6 0 BV (SUTURE) ×4 IMPLANT
SUT PROLENE 7 0 BV 1 (SUTURE) ×1 IMPLANT
SUT SILK 2 0 FS (SUTURE) ×2 IMPLANT
SUT VIC AB 3-0 SH 27 (SUTURE) ×4
SUT VIC AB 3-0 SH 27X BRD (SUTURE) ×2 IMPLANT
TOWEL OR 17X24 6PK STRL BLUE (TOWEL DISPOSABLE) ×2 IMPLANT
TOWEL OR 17X26 10 PK STRL BLUE (TOWEL DISPOSABLE) ×2 IMPLANT
UNDERPAD 30X30 INCONTINENT (UNDERPADS AND DIAPERS) ×2 IMPLANT
WATER STERILE IRR 1000ML POUR (IV SOLUTION) ×2 IMPLANT

## 2013-05-12 NOTE — Anesthesia Preprocedure Evaluation (Addendum)
Anesthesia Evaluation  Patient identified by MRN, date of birth, ID band Patient awake    Reviewed: Allergy & Precautions, H&P , NPO status , Patient's Chart, lab work & pertinent test results  History of Anesthesia Complications Negative for: history of anesthetic complications  Airway Mallampati: I TM Distance: >3 FB Neck ROM: Full    Dental  (+) Missing,    Pulmonary  breath sounds clear to auscultation        Cardiovascular hypertension, Pt. on medications + Peripheral Vascular Disease Rhythm:Regular Rate:Normal     Neuro/Psych CVA (blind in right eye), Residual Symptoms    GI/Hepatic   Endo/Other  diabetes, Type 2, Insulin Dependent  Renal/GU ESRFRenal diseaseHD M/W/F     Musculoskeletal   Abdominal   Peds  Hematology   Anesthesia Other Findings   Reproductive/Obstetrics                        Anesthesia Physical Anesthesia Plan  ASA: III  Anesthesia Plan: General   Post-op Pain Management:    Induction: Intravenous  Airway Management Planned: LMA  Additional Equipment:   Intra-op Plan:   Post-operative Plan: Extubation in OR  Informed Consent: I have reviewed the patients History and Physical, chart, labs and discussed the procedure including the risks, benefits and alternatives for the proposed anesthesia with the patient or authorized representative who has indicated his/her understanding and acceptance.   Dental advisory given  Plan Discussed with: Anesthesiologist and Surgeon  Anesthesia Plan Comments: (ESRD last HD 11/3 K-3.7 Type 2 DM glucose 117 Htn  Plan GA with LMA)      Anesthesia Quick Evaluation

## 2013-05-12 NOTE — Telephone Encounter (Addendum)
Message copied by Gena Fray on Tue May 12, 2013  4:49 PM ------      Message from: Alfonso Patten      Created: Tue May 12, 2013 10:42 AM                   ----- Message -----         From: Conrad [place], MD         Sent: 05/12/2013   9:46 AM           To: Patrici Ranks, Alfonso Patten, RN            Shawn [name]      [id number]      August 30, 1960            PROCEDURE:  right upper arm arteriovenous graft            Asst: Wray Kearns, [facility]             Follow-up: 4 weeks ------  Spoke with patient to schedule, dpm

## 2013-05-12 NOTE — Interval H&P Note (Signed)
[facility] [place]  History and Physical Update  The patient was interviewed and re-examined.  The patient's previous History and Physical has been reviewed and is unchanged.  There is no change in the plan of care: attempted RUA Hybrid AVG placement.  Adele Barthel, MD [facility] [place] Office: [phone] Pager: [phone]  05/12/2013, 7:27 AM

## 2013-05-12 NOTE — Transfer of Care (Signed)
Immediate Anesthesia Transfer of Care Note  Patient: Shawn [name]  Procedure(s) Performed: Procedure(s): INSERTION OF ARTERIOVENOUS (AV) GORE-TEX GRAFT ARM; ULTRASOUND GUIDED (Right)  Patient Location: PACU  Anesthesia Type:General  Level of Consciousness: awake, alert , oriented and patient cooperative  Airway & Oxygen Therapy: Patient Spontanous Breathing  Post-op Assessment: Report given to PACU RN and Post -op Vital signs reviewed and stable  Post vital signs: Reviewed and stable  Complications: No apparent anesthesia complications

## 2013-05-12 NOTE — Op Note (Addendum)
OPERATIVE NOTE   PROCEDURE:  right upper arm arteriovenous graft  PRE-OPERATIVE DIAGNOSIS: end stage renal disease   POST-OPERATIVE DIAGNOSIS: same as above   SURGEON: Adele Barthel, MD  ASSISTANT(S): Wray Kearns, [facility]   ANESTHESIA: general  ESTIMATED BLOOD LOSS: 50 cc  FINDING(S): 1. Patent axillary vein proximal to two stents in high brachial vein  SPECIMEN(S):  none  INDICATIONS:   Shawn [name] is a 52 y.o. male who presents with end stage renal disease and thrombosed Right upper arm arteriovenous graft.  The patient has had prior stenting x 2 in the brachial vein.  Subsequently, I recommended we attempt a right upper arm hybrid arteriovenous graft placement.  Risk, benefits, and alternatives to access surgery were discussed.  The patient is aware the risks include but are not limited to: bleeding, infection, steal syndrome, nerve damage, ischemic monomelic neuropathy, failure to mature, and need for additional procedures.  The patient is aware of the risks and elects to proceed forward.  DESCRIPTION: After full informed written consent was obtained from the patient, the patient was brought back to the operating room and placed supine upon the operating table.  The patient was given IV antibiotics prior to proceeding.  After obtaining adequate sedation, the patient was prepped and draped in standard fashion for a right arm access procedure.  I turned my attention first to the antecubitum.  Under ultrasound guidance, I identified the location of the brachial artery proximal to the previous arteriovenous graft and marked it on the skin.  I then examined the axilla and identified the axillary vein and marked it on the skin.  It appeared that the proximal axillary vein was compressible proximal to the previous stents.  I made an incision over the brachial artery, and dissected down through the subcutaneous tissue to the fascia carefully and was able to dissect out the brachial  artery.  The artery was about 3 mm externally.  It was controlled proximally and distally with vessel loops.  I dissected to the previous arteriovenous graft at this level and placed a 2-0 silk tie distally on this segment and then transected a 5 cm segment of the previous arteriovenous graft to give me space to route the new arteriovenous graft.  I turned my attention to the axilla.  I made an incision through the previous incision and dissected down through the subcutaneous tissue and fascia until I reached the stented segment of high brachial vein.  I was able to identify the axilla vein in continuity with the stented segment.  The axillary vein appeared to be 6-7 mm in diameter externally .  I then dissected this vein proximally and distal.  I had enough exposure of the axillary vein to do a new arteriovenous graft rather than a hybrid arteriovenous graft.  I took a curved metal Gore tunneler and dissected from the antecubitum up to the high bicipital incision.  Then I delivered the 4 x 7-mm stretch Gore-Tex graft, through this metal tunneler and then pulled out the metal tunneler leaving the graft in place.  The 4-mm end was left on the antecubital side and the 7 mm toward the axillary.  I then gave the patient 7000 units of heparin to gain some anticoagulation.  After waiting 3 minutes, I placed the brachial artery under tension proximally and distally with vessel loops, made an arteriotomy and extended it with a Potts scissor.  I sewed the 4-mm end of the graft to this arteriotomy with a running stitch of 6-0  Prolene.  At this point, then I completed the anastomosis in the usual fashion.  I released the vessel loops on the inflow and allowed the artery to decompress through the graft. There was good pulsatile bleeding through this graft.  I clamped the graft near its arterial anastomosis and sucked out all the blood in the graft and loaded the graft with heparinized saline.  At this point, I pulled the graft  to appropriate length and reset my exposure of the high brachial vein.  I tied off the high brachial vein distally with a 2-0 silk and then transected it.  There was now venous backbleeding from the vein due to a valve.  I passed a 5 mm dilator without any resistance and got excellent backbleeding.  Then, I injected some heparinized saline into this vein and then clamped it.  I then spatulated the vein to facilitate an end-to- end anastomosis.  I also spatulated the graft to facilitate an end-to-end anastomosis.  In the process of spatulating, I cut the graft to appropriate length for this anastomosis.  This graft was sewn to the vein in an end-to-end configuration with a 6-0 Prolene.  Prior to completing this anastomosis, I allowed the vein to back bleed and then I also allowed the artery to bleed in an antegrade fashion.  I completed this anastomosis in the usual fashion, and then irrigated out the axillary incision and then placed thrombin and Gelfoam.  I then turned my attention back to the antecubitum.  The distal radial signal was dopplerable.  Using a continuous Doppler, the brachial artery proximally and distally had multiphasic waveforms.  The venous outflow had a flow signature consistent with widely patent arterial venous graft.  At this point, I washed out the antecubital incision.  There was no more active bleeding.  The subcutaneous tissue was reapproximated with a running stitch of 3-0 Vicryl.  The skin was then reapproximated with a running subcuticular 4-0 Monocryl.  The skin was then cleaned, dried, and Dermabond used to reinforce the skin closure.  We then turned our attention to the high bicipital exposure.  I removed all the thrombin and gelfoam and washed out the wound.  There was no more active bleeding.  The subcutaneous tissue was repaired with running stitch of 3-0 Vicryl.  The skin was then reapproximated with running subcuticular 4-0 Monocryl.  The skin was then cleaned, dried, and then  the skin closure was reinforced with Dermabond.  By the end of the case, there was a palpable thrill in the laterally routed arteriovenous graft.  COMPLICATIONS: none  CONDITION: stable   Adele Barthel, MD [facility] [place] Office: [phone] Pager: [phone]  05/12/2013, 9:38 AM

## 2013-05-12 NOTE — Anesthesia Postprocedure Evaluation (Signed)
  Anesthesia Post-op Note  Patient: Shawn [name]  Procedure(s) Performed: Procedure(s): INSERTION OF ARTERIOVENOUS (AV) GORE-TEX GRAFT ARM; ULTRASOUND GUIDED (Right)  Patient Location: PACU  Anesthesia Type:General  Level of Consciousness: awake, alert  and oriented  Airway and Oxygen Therapy: Patient Spontanous Breathing  Post-op Pain: mild  Post-op Assessment: Post-op Vital signs reviewed, Patient's Cardiovascular Status Stable, Respiratory Function Stable, Patent Airway and Pain level controlled  Post-op Vital Signs: stable  Complications: No apparent anesthesia complications

## 2013-05-12 NOTE — H&P (View-Only) (Signed)
[facility] [place]  Established Dialysis Access  History of Present Illness  Shawn [name] is a 52 y.o. (12-Mar-1961) male who presents for re-evaluation of RUA AVG.  The patient is right hand dominant.  Previous access procedures have been completed in the right arm.  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  The patient has never had a previous PPM placed.  His RUA AVG has required thrombolysis every two weeks and recently reoccluded.  A RIJ TDC was subsequently placed.  Past Medical History  Diagnosis Date  . Hypertension   . Stroke   . Diabetes mellitus   . Chronic kidney disease   . Arthritis   . No pertinent past medical history   . Blind right eye   . 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 Left arm graft  10/2010  . Hernia repair      History   Social History  . Marital Status: Married    Spouse Name: N/A    Number of Children: 0  . Years of Education: 12th   Occupational History  .     Social History Main Topics  . Smoking status: Never Smoker   . Smokeless tobacco: Never Used  . Alcohol Use: No  . Drug Use: No  . Sexual Activity: Not on file   Other Topics Concern  . Not on file   Social History Narrative   Patient lives at home with wife.    Patient is disabled.    Patient has no children.    Patient has a 12 grade education.           Family History  Problem Relation Age of Onset  . Cancer Sister   . Stroke Sister   . Diabetes Mother     Current Outpatient Prescriptions on File Prior to Visit  Medication Sig Dispense Refill  . aspirin 325 MG tablet Take 325 mg by mouth daily.      . B Complex Vitamins (B COMPLEX-B12) TABS Take 1 tablet by mouth daily.      . colesevelam (WELCHOL) 625 MG tablet Take 1,250 mg by mouth daily with supper.      . insulin  NPH-insulin regular (NOVOLIN 70/30) (70-30) 100 UNIT/ML injection Inject 15 Units into the skin 2 (two) times daily with a meal.      . lisinopril (PRINIVIL,ZESTRIL) 10 MG tablet Take 5 mg by mouth daily. Standing bp of 110 or more      . atorvastatin (LIPITOR) 40 MG tablet Take 40 mg by mouth daily.      . folic acid-vitamin b complex-vitamin c-selenium-zinc (DIALYVITE) 3 MG TABS Take 1 tablet by mouth daily.       No current facility-administered medications on file prior to visit.    No Known Allergies  REVIEW OF SYSTEMS:  (Positives checked otherwise negative)  CARDIOVASCULAR:  []  chest pain, []  chest pressure, []  palpitations, []  shortness of breath when laying flat, []  shortness of breath with exertion,  []  pain in feet when walking, []  pain in feet when laying flat, []  history of blood clot in veins (DVT), []  history of phlebitis, []  swelling in legs, []  varicose veins  PULMONARY:  []  productive cough, []  asthma, []  wheezing  NEUROLOGIC:  []  weakness in arms or legs, []  numbness in arms or legs, []  difficulty speaking or slurred speech, []   temporary loss of vision in one eye, []  dizziness  HEMATOLOGIC:  []  bleeding problems, []  problems with blood clotting too easily  MUSCULOSKEL:  []  joint pain, []  joint swelling  GASTROINTEST:  []  vomiting blood, []  blood in stool     GENITOURINARY:  []  burning with urination, []  blood in urine  PSYCHIATRIC:  []  history of major depression  INTEGUMENTARY:  []  rashes, []  ulcers  Physical Examination  Filed Vitals:   04/17/13 1221  BP: 113/72  Pulse: 82  Resp: 16  Height: 5\' 11"  (1.803 m)  Weight: 208 lb (94.348 kg)   Body mass index is 29.02 kg/(m^2).  General: A&O x 3, WD, WN  Pulmonary: Sym exp, good air movt, CTAB, no rales, rhonchi, & wheezing  Cardiac: RRR, Nl S1, S2, no Murmurs, rubs or gallops  Vascular: Vessel Right Left  Radial Not Palpable Faintly Palpable  Ulnar Not Palpable Not Palpable  Brachial Palpable  Faintly Palpable   Gastrointestinal: soft, NTND, -G/R, - HSM, - masses, - CVAT B  Musculoskeletal: M/S 5/5 throughout , Extremities without  ischemic changes , no palpable thrill in access in RUA AVG, No bruit in access, incision extends into proximal axilla, firm cord palpable in axilla  Neurologic: Pain and light touch intact in extremities , Motor exam as listed above  Outside Studies/Documentation 12 pages of outside documents were reviewed including: extensive nephrology records  Medical Decision Making  Shawn [name] is a 52 y.o. male who presents with ESRD requiring hemodialysis and thrombosed RUA AVG.   I reviewed his prior shuntogram and there are obvious stents within this graft.  Its not clear to me if these are the palpable cord in the axilla.  As his left arm has had steal, his options are limited on that side.  Further evaluation including possible left arm angiogram might be necessary if additional access options in right arm are not possible.  I think it is reasonable to attempt a right axillary exploration and possible hybrid AVG placement.  If the proximal axillary vein is patent, a hybrid graft might be usable.  I doubt a tradition AVG is possible given the high extent of the prior AVG.   Risk, benefits, and alternatives to access surgery were discussed.  The patient is aware the risks include but are not limited to: bleeding, infection, steal syndrome, nerve damage, ischemic monomelic neuropathy, failure to mature, need for additional procedures, death and stroke.   The patient agrees to proceed forward with the procedure. As the patient is on HD on M-W-F, I will need to schedule him T-R.  I might be able to get him on the schedule on a Thurs but due to scheduling conflicts in both the patient's schedule and mine, the earliest Tuesday I can schedule him in the 3 NOV 14.  Adele Barthel, MD [facility] [place] Office: [phone] Pager:  [phone]  04/17/2013, 1:37 PM

## 2013-05-14 ENCOUNTER — Encounter (HOSPITAL_COMMUNITY): Payer: Self-pay | Admitting: Vascular Surgery

## 2013-06-07 DIAGNOSIS — N186 End stage renal disease: Secondary | ICD-10-CM | POA: Diagnosis not present

## 2013-06-08 DIAGNOSIS — N2581 Secondary hyperparathyroidism of renal origin: Secondary | ICD-10-CM | POA: Diagnosis not present

## 2013-06-08 DIAGNOSIS — D631 Anemia in chronic kidney disease: Secondary | ICD-10-CM | POA: Diagnosis not present

## 2013-06-08 DIAGNOSIS — N186 End stage renal disease: Secondary | ICD-10-CM | POA: Diagnosis not present

## 2013-06-08 DIAGNOSIS — D509 Iron deficiency anemia, unspecified: Secondary | ICD-10-CM | POA: Diagnosis not present

## 2013-06-08 DIAGNOSIS — Z992 Dependence on renal dialysis: Secondary | ICD-10-CM | POA: Diagnosis not present

## 2013-06-09 DIAGNOSIS — E7800 Pure hypercholesterolemia, unspecified: Secondary | ICD-10-CM | POA: Diagnosis not present

## 2013-06-09 DIAGNOSIS — Z006 Encounter for examination for normal comparison and control in clinical research program: Secondary | ICD-10-CM | POA: Diagnosis not present

## 2013-06-09 DIAGNOSIS — I119 Hypertensive heart disease without heart failure: Secondary | ICD-10-CM | POA: Diagnosis not present

## 2013-06-09 DIAGNOSIS — E1129 Type 2 diabetes mellitus with other diabetic kidney complication: Secondary | ICD-10-CM | POA: Diagnosis not present

## 2013-06-09 DIAGNOSIS — N186 End stage renal disease: Secondary | ICD-10-CM | POA: Diagnosis not present

## 2013-06-09 DIAGNOSIS — I5022 Chronic systolic (congestive) heart failure: Secondary | ICD-10-CM | POA: Diagnosis not present

## 2013-06-11 ENCOUNTER — Encounter: Payer: Self-pay | Admitting: Vascular Surgery

## 2013-06-12 ENCOUNTER — Encounter: Payer: Medicare Other | Admitting: Vascular Surgery

## 2013-06-12 ENCOUNTER — Ambulatory Visit (INDEPENDENT_AMBULATORY_CARE_PROVIDER_SITE_OTHER): Payer: Self-pay | Admitting: Vascular Surgery

## 2013-06-12 ENCOUNTER — Encounter: Payer: Self-pay | Admitting: Vascular Surgery

## 2013-06-12 VITALS — BP 139/72 | HR 84 | Ht 71.0 in | Wt 205.0 lb

## 2013-06-12 DIAGNOSIS — N186 End stage renal disease: Secondary | ICD-10-CM | POA: Insufficient documentation

## 2013-06-12 NOTE — Progress Notes (Signed)
[facility] [place]  Postoperative Access Visit  History of Present Illness  Seeley Letter is a 52 y.o. year old male who presents for postoperative follow-up for: RUA AVG (Date: 05/12/13).  The patient's wounds are healed.  The patient notes no steal symptoms.  The patient is able to complete their activities of daily living.  The patient's current symptoms are: none.  For VQI Use Only  PRE-ADM LIVING: Home  AMB STATUS: Ambulatory  Physical Examination Filed Vitals:   06/12/13 1402  BP: 139/72  Pulse: 84    RUE: Incisions are healed, skin feels warm, hand grip is 5/5, sensation in digits is intact, palpable thrill, bruit can be auscultated , hypothenar wasting in both hands  Medical Decision Making  Zared Camelo is a 52 y.o. year old male who presents s/p RUA AVG.  The patient's access is ready for use.  I marked the AVG, which I tunnel lateral to the prior AVG  The patient's tunneled dialysis catheter can be removed after two successful cannulations and completed dialysis treatments.  Thank you for allowing [place] to participate in this patient's care.  Adele Barthel, MD [facility] [place] Office: [phone] Pager: [phone]  06/12/2013, 2:37 PM

## 2013-06-25 DIAGNOSIS — T82898A Other specified complication of vascular prosthetic devices, implants and grafts, initial encounter: Secondary | ICD-10-CM | POA: Diagnosis not present

## 2013-06-25 DIAGNOSIS — N186 End stage renal disease: Secondary | ICD-10-CM | POA: Diagnosis not present

## 2013-06-25 DIAGNOSIS — I871 Compression of vein: Secondary | ICD-10-CM | POA: Diagnosis not present

## 2013-07-08 DIAGNOSIS — N186 End stage renal disease: Secondary | ICD-10-CM | POA: Diagnosis not present

## 2013-07-10 DIAGNOSIS — D631 Anemia in chronic kidney disease: Secondary | ICD-10-CM | POA: Diagnosis not present

## 2013-07-10 DIAGNOSIS — Z992 Dependence on renal dialysis: Secondary | ICD-10-CM | POA: Diagnosis not present

## 2013-07-10 DIAGNOSIS — N2581 Secondary hyperparathyroidism of renal origin: Secondary | ICD-10-CM | POA: Diagnosis not present

## 2013-07-10 DIAGNOSIS — D509 Iron deficiency anemia, unspecified: Secondary | ICD-10-CM | POA: Diagnosis not present

## 2013-07-10 DIAGNOSIS — N186 End stage renal disease: Secondary | ICD-10-CM | POA: Diagnosis not present

## 2013-07-28 ENCOUNTER — Ambulatory Visit (INDEPENDENT_AMBULATORY_CARE_PROVIDER_SITE_OTHER): Payer: Medicare Other

## 2013-07-28 VITALS — BP 110/53 | HR 84 | Resp 16

## 2013-07-28 DIAGNOSIS — L608 Other nail disorders: Secondary | ICD-10-CM

## 2013-07-28 DIAGNOSIS — Q828 Other specified congenital malformations of skin: Secondary | ICD-10-CM | POA: Diagnosis not present

## 2013-07-28 DIAGNOSIS — E1149 Type 2 diabetes mellitus with other diabetic neurological complication: Secondary | ICD-10-CM | POA: Diagnosis not present

## 2013-07-28 DIAGNOSIS — E1142 Type 2 diabetes mellitus with diabetic polyneuropathy: Secondary | ICD-10-CM

## 2013-07-28 DIAGNOSIS — E1140 Type 2 diabetes mellitus with diabetic neuropathy, unspecified: Secondary | ICD-10-CM

## 2013-07-28 NOTE — Patient Instructions (Signed)
Diabetes and Foot Care Diabetes may cause you to have problems because of poor blood supply (circulation) to your feet and legs. This may cause the skin on your feet to become thinner, break easier, and heal more slowly. Your skin may become dry, and the skin may peel and crack. You may also have nerve damage in your legs and feet causing decreased feeling in them. You may not notice minor injuries to your feet that could lead to infections or more serious problems. Taking care of your feet is one of the most important things you can do for yourself.  HOME CARE INSTRUCTIONS  Wear shoes at all times, even in the house. Do not go barefoot. Bare feet are easily injured.  Check your feet daily for blisters, cuts, and redness. If you cannot see the bottom of your feet, use a mirror or ask someone for help.  Wash your feet with warm water (do not use hot water) and mild soap. Then pat your feet and the areas between your toes until they are completely dry. Do not soak your feet as this can dry your skin.  Apply a moisturizing lotion or petroleum jelly (that does not contain alcohol and is unscented) to the skin on your feet and to dry, brittle toenails. Do not apply lotion between your toes.  Trim your toenails straight across. Do not dig under them or around the cuticle. File the edges of your nails with an emery board or nail file.  Do not cut corns or calluses or try to remove them with medicine.  Wear clean socks or stockings every day. Make sure they are not too tight. Do not wear knee-high stockings since they may decrease blood flow to your legs.  Wear shoes that fit properly and have enough cushioning. To break in new shoes, wear them for just a few hours a day. This prevents you from injuring your feet. Always look in your shoes before you put them on to be sure there are no objects inside.  Do not cross your legs. This may decrease the blood flow to your feet.  If you find a minor scrape,  cut, or break in the skin on your feet, keep it and the skin around it clean and dry. These areas may be cleansed with mild soap and water. Do not cleanse the area with peroxide, alcohol, or iodine.  When you remove an adhesive bandage, be sure not to damage the skin around it.  If you have a wound, look at it several times a day to make sure it is healing.  Do not use heating pads or hot water bottles. They may burn your skin. If you have lost feeling in your feet or legs, you may not know it is happening until it is too late.  Make sure your health care provider performs a complete foot exam at least annually or more often if you have foot problems. Report any cuts, sores, or bruises to your health care provider immediately. SEEK MEDICAL CARE IF:   You have an injury that is not healing.  You have cuts or breaks in the skin.  You have an ingrown nail.  You notice redness on your legs or feet.  You feel burning or tingling in your legs or feet.  You have pain or cramps in your legs and feet.  Your legs or feet are numb.  Your feet always feel cold. SEEK IMMEDIATE MEDICAL CARE IF:   There is increasing redness,   swelling, or pain in or around a wound.  There is a red line that goes up your leg.  Pus is coming from a wound.  You develop a fever or as directed by your health care provider.  You notice a bad smell coming from an ulcer or wound. Document Released: 06/22/2000 Document Revised: 02/25/2013 Document Reviewed: 12/02/2012 [employer] Patient Information 2014 [employer].  

## 2013-07-28 NOTE — Progress Notes (Signed)
   Subjective:    Patient ID: Shawn [name], male    DOB: [birth year], 53 y.o.   MRN: [id number]  HPI Comments: "Cut the toenails"     Review of Systems deferred at this visit no new findings noted     Objective:   Physical Exam Neurovascular status intact as follows DP +2/4 PT plus one over 4 bilateral Refill time 3 seconds all digits neurologically epicritic and proprioceptive sensations diminished on Semmes Weinstein testing to the digits and plantar forefoot bilateral. Orthopedic biomechanical exam rectus foot type mild digital contractures noted this pinch callus first bilateral secondary digital contractures slight HAV deformity also some distal keratoses fifth digits bilateral to adductovarus rotation. No open wounds ulcerations of secondary infections this time.       Assessment & Plan:  Assessment diabetes with peripheral neuropathy. 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 Katherine Roan his primary physician for authorization for diabetic shoes in the future. Followup in 3 months for palliative care  Harriet Masson DPM

## 2013-08-05 DIAGNOSIS — E1129 Type 2 diabetes mellitus with other diabetic kidney complication: Secondary | ICD-10-CM | POA: Diagnosis not present

## 2013-08-08 DIAGNOSIS — N186 End stage renal disease: Secondary | ICD-10-CM | POA: Diagnosis not present

## 2013-08-10 DIAGNOSIS — D631 Anemia in chronic kidney disease: Secondary | ICD-10-CM | POA: Diagnosis not present

## 2013-08-10 DIAGNOSIS — Z992 Dependence on renal dialysis: Secondary | ICD-10-CM | POA: Diagnosis not present

## 2013-08-10 DIAGNOSIS — N186 End stage renal disease: Secondary | ICD-10-CM | POA: Diagnosis not present

## 2013-08-10 DIAGNOSIS — D509 Iron deficiency anemia, unspecified: Secondary | ICD-10-CM | POA: Diagnosis not present

## 2013-08-10 DIAGNOSIS — N2581 Secondary hyperparathyroidism of renal origin: Secondary | ICD-10-CM | POA: Diagnosis not present

## 2013-08-11 DIAGNOSIS — E559 Vitamin D deficiency, unspecified: Secondary | ICD-10-CM | POA: Diagnosis not present

## 2013-08-11 DIAGNOSIS — I119 Hypertensive heart disease without heart failure: Secondary | ICD-10-CM | POA: Diagnosis not present

## 2013-08-11 DIAGNOSIS — IMO0001 Reserved for inherently not codable concepts without codable children: Secondary | ICD-10-CM | POA: Diagnosis not present

## 2013-08-11 DIAGNOSIS — Z125 Encounter for screening for malignant neoplasm of prostate: Secondary | ICD-10-CM | POA: Diagnosis not present

## 2013-08-11 DIAGNOSIS — Z79899 Other long term (current) drug therapy: Secondary | ICD-10-CM | POA: Diagnosis not present

## 2013-08-11 DIAGNOSIS — E7800 Pure hypercholesterolemia, unspecified: Secondary | ICD-10-CM | POA: Diagnosis not present

## 2013-09-05 DIAGNOSIS — N186 End stage renal disease: Secondary | ICD-10-CM | POA: Diagnosis not present

## 2013-09-07 DIAGNOSIS — N186 End stage renal disease: Secondary | ICD-10-CM | POA: Diagnosis not present

## 2013-09-07 DIAGNOSIS — D509 Iron deficiency anemia, unspecified: Secondary | ICD-10-CM | POA: Diagnosis not present

## 2013-09-07 DIAGNOSIS — N2581 Secondary hyperparathyroidism of renal origin: Secondary | ICD-10-CM | POA: Diagnosis not present

## 2013-09-07 DIAGNOSIS — Z992 Dependence on renal dialysis: Secondary | ICD-10-CM | POA: Diagnosis not present

## 2013-09-29 ENCOUNTER — Encounter: Payer: Self-pay | Admitting: Nurse Practitioner

## 2013-09-29 ENCOUNTER — Encounter (INDEPENDENT_AMBULATORY_CARE_PROVIDER_SITE_OTHER): Payer: Self-pay

## 2013-09-29 ENCOUNTER — Ambulatory Visit (INDEPENDENT_AMBULATORY_CARE_PROVIDER_SITE_OTHER): Payer: Medicare Other | Admitting: Nurse Practitioner

## 2013-09-29 VITALS — BP 119/69 | HR 91 | Ht 71.0 in | Wt 206.0 lb

## 2013-09-29 DIAGNOSIS — N186 End stage renal disease: Secondary | ICD-10-CM | POA: Diagnosis not present

## 2013-09-29 DIAGNOSIS — I6350 Cerebral infarction due to unspecified occlusion or stenosis of unspecified cerebral artery: Secondary | ICD-10-CM | POA: Diagnosis not present

## 2013-09-29 NOTE — Patient Instructions (Signed)
Continue aspirin for secondary stroke prevention Will schedule TCD and carotid Doppler, last done in 2013 Continue to monitor your risk factors of  Diabetes,  cholesterol , and hypertension Followup Dr. Leonie Man in 6 months

## 2013-09-29 NOTE — Progress Notes (Signed)
[facility]  [name]: Shawn [name] DOB: 01-14-1961   REASON FOR VISIT: Followup for stroke   HISTORY OF PRESENT ILLNESS: Shawn [name], 53 year old male returns male returns for followup. He has history of stroke which occurred in April 2012 he is currently on aspirin 325 without further stroke or TIA symptoms. He goes to dialysis 3 times a week. He had a revision of his AV shunt in November. 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 He denies any recent falls , he ambulates with a single-point cane. He returns for reevaluation. He has vascular risk factors of hypertension,  Diabetes and  hyperlipidemia.  UPDATE 04/02/13 Shawn [name], 53 year old black male returns for followup. He has a history of stroke which occurred in April 2012. He is also on dialysis 3 times weekly. His shunt clotted after his dialysis yesterday. Blood pressure is stable today. [name] claims that his blood pressure medication was decreased he has had less orthostasis. He denies further stroke or TIA symptoms. It vision remains poor, he is blind in Shawn right. He is ambulating with a cane and denies any fallsTCD study 06/10/12 is consistent with abnormally elevated CBFV's through Shawn majority arteries of Shawn ant and post circulation. Shawn cause of this finding could be multi-focal stenotic narrowing of cerebral vessels vs effects of systemic factors like anemia. Carotid doppler is negative for significant stenosis.  HISTORY: 53 year old male with acute ischemic cerebrovascular infarct in Shawn body of Shawn corpus callosum on April 18th 2012. Shawn [name] presented with gait instability, vertigo and dizziness, which have resolved completely, except Shawn [name] still has some gait instability. But he can walk pretty well with a cane. 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 Shawn [name] had cataract surgery and retinal  repair on both of his eyes before Shawn stroke. Shawn [name] has a PMH of end stage renal disease, and goes to dialysis 3x a week.  He Is seen urgently today following recent visit to [facility] room on 03/04/12 for dizziness. He woke up that day feeling dizzy and off-balance and with a sensation of body being pulled to Shawn right side. This was noticed mainly when he was in Shawn upright position and he felt better when he sat down or lay down. He denied any vertigo, nausea, diplopia. He did have some minor tingling in his right hand. His blood pressure was recorded as 133/85 in Shawn ER but they did not check orthostatics. CBC and BMP were unremarkable. [name] states he had similar but milder episode today's later following dialysis when his blood pressure was recorded being low in Shawn 80s. Similarly symptoms were noticeable when he was upright and improved when he lays flat. He complains of difficulty walking and exercising particularly on Shawn days he gets dialysis as he feels quite tired. He had lipid profile checked last month by Dr. Katherine Roan and LDL was elevated but he cannot give me Shawn exact numbers and I do not have those results. He continues to have poor vision annd uses a cane to walk.     REVIEW OF SYSTEMS: Full 14 system review of systems performed and notable only for those listed, all others are neg:  Constitutional: N/A  Cardiovascular: N/A  Ear/Nose/Throat: N/A  Skin: N/A  Eyes: Blind in Shawn right eye  Respiratory: N/A  Gastroitestinal: N/A  Hematology/Lymphatic: N/A  Endocrine:  N/A Musculoskeletal:N/A  Allergy/Immunology: N/A  Neurological: N/A Psychiatric: N/A   ALLERGIES: No Known Allergies  HOME MEDICATIONS: Outpatient Prescriptions Prior to Visit  Medication Sig Dispense Refill  . aspirin 325 MG tablet Take 325 mg by mouth daily.      . B Complex Vitamins (B COMPLEX-B12) TABS Take 1 tablet by mouth daily.      . colesevelam (WELCHOL) 625 MG tablet Take 1,250 mg  by mouth daily with supper.      . dorzolamide-timolol (COSOPT) 22.3-6.8 MG/ML ophthalmic solution Place 1 drop into Shawn left eye 2 (two) times daily.      . folic acid-vitamin b complex-vitamin c-selenium-zinc (DIALYVITE) 3 MG TABS Take 1 tablet by mouth daily.      . insulin NPH-insulin regular (NOVOLIN 70/30) (70-30) 100 UNIT/ML injection Inject 15 Units into Shawn skin 2 (two) times daily with a meal.      . lisinopril (PRINIVIL,ZESTRIL) 5 MG tablet Take 5 mg by mouth daily.      Marland Kitchen oxyCODONE (ROXICODONE) 5 MG immediate release tablet Take 1-2 tablets (5-10 mg total) by mouth every 4 (four) hours as needed for severe pain.  40 tablet  0  . PRESCRIPTION MEDICATION Apply to eye. Eye drop.      Marland Kitchen PRESCRIPTION MEDICATION Apply to eye. Eye drop.      . rosuvastatin (CRESTOR) 20 MG tablet Take 20 mg by mouth daily.       No facility-administered medications prior to visit.    PAST MEDICAL HISTORY: Past Medical History  Diagnosis Date  . Hypertension   . Stroke   . Diabetes mellitus   . Arthritis   . No pertinent past medical history   . Blind right eye   . 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 Chronic kidney disease     Dialysis since 2011 MWF    PAST SURGICAL HISTORY: Past Surgical History  Procedure Laterality Date  . Left arm graft  10/2010  . Hernia repair    . Insertion of dialysis catheter Right   . Eye surgery      retina surgery both eyes, cataract surgery both eyes  . Colonoscopy  06/2012  . Av fistula placement Right 05/12/2013    Procedure: INSERTION OF ARTERIOVENOUS (AV) GORE-TEX GRAFT ARM; ULTRASOUND GUIDED;  Surgeon: Conrad [place], MD;  Location: [facility] OR;  Service: Vascular;  Laterality: Right;    FAMILY HISTORY: Family History  Problem Relation Age of Onset  . Cancer Sister   . Stroke Sister   . Diabetes Mother   . Alzheimer's disease Mother   . Cancer Father   . Heart disease Father     SOCIAL HISTORY: History   Social  History  . Marital Status: Married    Spouse Name: N/A    Number of Children: 0  . Years of Education: 12th   Occupational History  .     Social History Main Topics  . Smoking status: Never Smoker   . Smokeless tobacco: Never Used  . Alcohol Use: No  . Drug Use: No  . Sexual Activity: Not on file   Other Topics Concern  . Not on file   Social History Narrative   [name] lives at home with wife.    [name] is disabled.    [name] has no children.    [name] has a 12 grade education.            PHYSICAL EXAM  Filed Vitals:   09/29/13  1417  BP: 119/69  Pulse: 91  Height: 5\' 11"  (1.803 m)  Weight: 206 lb (93.441 kg)   Body mass index is 28.74 kg/(m^2).  Generalized: Well developed, in no acute distress  Head: normocephalic and atraumatic,. Oropharynx benign  Neck: Supple, no carotid bruits  Cardiac: Regular rate rhythm, no murmur    Neurological examination   Mentation: Alert oriented to time, place, history taking. Follows all commands speech and language fluent  Cranial nerve II-XII: Pupils are enlarged right greater than left and neither react. Conjugate eye movements are restricted on Shawn left just moving medial and movement on Shawn right, visual fields are restricted to confrontation on Shawn left visual field testing central and medial vision field loss laterally. Right full vision loss  Facial sensation and strength were normal. hearing was intact to finger rubbing bilaterally. Uvula tongue midline. head turning and shoulder shrug were normal and symmetric.Tongue protrusion into cheek strength was normal. Motor: normal bulk and tone, full strength in Shawn BUE, BLE, fine finger movements normal, no pronator drift. No focal weakness Sensory: normal and symmetric to light touch, pinprick, and  vibration  Coordination: finger-nose-finger, heel-to-shin bilaterally, no dysmetria Reflexes: 1+ upper and lower and symmetric Gait and Station: Wide-based gait with cane,  unsteady with tandem   DIAGNOSTIC DATA (LABS, IMAGING, TESTING) - I reviewed [name] records, labs, notes, testing and imaging myself where available.  Lab Results  Component Value Date   WBC 8.3 03/04/2012   HGB 11.9* 05/12/2013   HCT 35.0* 05/12/2013   MCV 91.2 03/04/2012   PLT 320 03/04/2012      Component Value Date/Time   NA 135 05/12/2013 0728   K 3.7 05/12/2013 0728   CL 97 03/04/2012 1341   CO2 31 03/04/2012 1341   GLUCOSE 117* 05/12/2013 0728   BUN 28* 03/04/2012 1341   CREATININE 7.73* 03/04/2012 1341   CALCIUM 9.6 03/04/2012 1341   CALCIUM 7.7* 01/28/2010 1225   PROT 7.4 03/04/2012 1341   ALBUMIN 3.5 03/04/2012 1341   AST 9 03/04/2012 1341   ALT 7 03/04/2012 1341   ALKPHOS 113 03/04/2012 1341   BILITOT 0.3 03/04/2012 1341   GFRNONAA 7* 03/04/2012 1341   GFRAA 8* 03/04/2012 1341        ASSESSMENT AND PLAN  53 y.o. year old male  has a past medical history of Hypertension; Stroke; Diabetes mellitus;  Blind right eye; Unspecified cerebral artery occlusion with cerebral infarction (04/02/2013); and Chronic kidney disease. here to followup  Continue aspirin for secondary stroke prevention Will schedule TCD and carotid Doppler, last done in 2013 Continue to monitor your risk factors of  Diabetes,  cholesterol , and hypertension Followup Dr. Leonie Man in 6 months  Dennie Bible, [facility], [facility], [facility] [address] [place], [place] [address] [phone]

## 2013-10-02 DIAGNOSIS — Z111 Encounter for screening for respiratory tuberculosis: Secondary | ICD-10-CM | POA: Insufficient documentation

## 2013-10-06 DIAGNOSIS — N186 End stage renal disease: Secondary | ICD-10-CM | POA: Diagnosis not present

## 2013-10-07 ENCOUNTER — Ambulatory Visit (INDEPENDENT_AMBULATORY_CARE_PROVIDER_SITE_OTHER): Payer: Medicare Other

## 2013-10-07 DIAGNOSIS — I6350 Cerebral infarction due to unspecified occlusion or stenosis of unspecified cerebral artery: Secondary | ICD-10-CM

## 2013-10-07 DIAGNOSIS — D631 Anemia in chronic kidney disease: Secondary | ICD-10-CM | POA: Diagnosis not present

## 2013-10-07 DIAGNOSIS — N039 Chronic nephritic syndrome with unspecified morphologic changes: Secondary | ICD-10-CM | POA: Diagnosis not present

## 2013-10-07 DIAGNOSIS — N2581 Secondary hyperparathyroidism of renal origin: Secondary | ICD-10-CM | POA: Diagnosis not present

## 2013-10-07 DIAGNOSIS — E119 Type 2 diabetes mellitus without complications: Secondary | ICD-10-CM | POA: Diagnosis not present

## 2013-10-07 DIAGNOSIS — D509 Iron deficiency anemia, unspecified: Secondary | ICD-10-CM | POA: Diagnosis not present

## 2013-10-07 DIAGNOSIS — Z0289 Encounter for other administrative examinations: Secondary | ICD-10-CM

## 2013-10-07 DIAGNOSIS — N186 End stage renal disease: Secondary | ICD-10-CM | POA: Diagnosis not present

## 2013-10-07 DIAGNOSIS — Z992 Dependence on renal dialysis: Secondary | ICD-10-CM | POA: Diagnosis not present

## 2013-10-15 ENCOUNTER — Telehealth: Payer: Self-pay | Admitting: Nurse Practitioner

## 2013-10-15 NOTE — Telephone Encounter (Signed)
Called pt to inform him per Hoyle Sauer, NP that pt's carotid doppler is negative for significant stenosis and that the TCD is consistent with mild stenosis, no change when compared to previous. I advised the pt that if he has any other problems, questions or concerns to call the office. Pt verbalized understanding.

## 2013-10-15 NOTE — Telephone Encounter (Signed)
Please let patient know carotid doppler is negative for significant stenosis. TCD is consistent with mild stenosis. No change when compared to previous.

## 2013-10-20 ENCOUNTER — Ambulatory Visit: Payer: Medicare Other

## 2013-10-20 DIAGNOSIS — H4720 Unspecified optic atrophy: Secondary | ICD-10-CM | POA: Diagnosis not present

## 2013-10-20 DIAGNOSIS — E11359 Type 2 diabetes mellitus with proliferative diabetic retinopathy without macular edema: Secondary | ICD-10-CM | POA: Diagnosis not present

## 2013-10-20 DIAGNOSIS — E1139 Type 2 diabetes mellitus with other diabetic ophthalmic complication: Secondary | ICD-10-CM | POA: Diagnosis not present

## 2013-10-27 ENCOUNTER — Ambulatory Visit (INDEPENDENT_AMBULATORY_CARE_PROVIDER_SITE_OTHER): Payer: Medicare Other

## 2013-10-27 ENCOUNTER — Ambulatory Visit: Payer: Medicare Other

## 2013-10-27 VITALS — BP 116/64 | HR 92 | Resp 16

## 2013-10-27 DIAGNOSIS — E1149 Type 2 diabetes mellitus with other diabetic neurological complication: Secondary | ICD-10-CM

## 2013-10-27 DIAGNOSIS — L608 Other nail disorders: Secondary | ICD-10-CM | POA: Diagnosis not present

## 2013-10-27 DIAGNOSIS — Q828 Other specified congenital malformations of skin: Secondary | ICD-10-CM

## 2013-10-27 DIAGNOSIS — E1140 Type 2 diabetes mellitus with diabetic neuropathy, unspecified: Secondary | ICD-10-CM

## 2013-10-27 NOTE — Progress Notes (Signed)
   Subjective:    Patient ID: Shawn [name], male    DOB: 1961-03-27, 53 y.o.   MRN: [id number]  HPI Comments: "Cut the toenails"       Review of Systems no new systemic changes or findings     Objective:   Physical Exam 53 year old [place] American male well-developed well-nourished oriented times 3. Patient presents at this time for diabetic foot and nail care has pedal pulses palpable DP pulse two over four PT plus one over 4 capillary refill time 3 seconds all digits patient has significant neuropathy with decreased epicritic and proprioceptive sensations to the forefoot and toes bilateral is normal plantar response DTRs not listed patient does have some dry scaling skin rectus foot type otherwise noted mild flexible digital contractures no keratoses at this time no secondary infections no open wounds or ulcerations mild HAV deformity noted       Assessment & Plan:  Assessment this time his diabetes with peripheral neuropathy. Multiple dystrophic friable criptotic nails 1 through 5 bilateral debridement present diabetes and complications return for future palliative nail care for debridement on an as-needed basis for followup patient's most recent A1c was 6.7 otherwise doing stable as far as his diabetes .  Harriet Masson DP

## 2013-10-27 NOTE — Patient Instructions (Signed)
Diabetes and Foot Care Diabetes may cause you to have problems because of poor blood supply (circulation) to your feet and legs. This may cause the skin on your feet to become thinner, break easier, and heal more slowly. Your skin may become dry, and the skin may peel and crack. You may also have nerve damage in your legs and feet causing decreased feeling in them. You may not notice minor injuries to your feet that could lead to infections or more serious problems. Taking care of your feet is one of the most important things you can do for yourself.  HOME CARE INSTRUCTIONS  Wear shoes at all times, even in the house. Do not go barefoot. Bare feet are easily injured.  Check your feet daily for blisters, cuts, and redness. If you cannot see the bottom of your feet, use a mirror or ask someone for help.  Wash your feet with warm water (do not use hot water) and mild soap. Then pat your feet and the areas between your toes until they are completely dry. Do not soak your feet as this can dry your skin.  Apply a moisturizing lotion or petroleum jelly (that does not contain alcohol and is unscented) to the skin on your feet and to dry, brittle toenails. Do not apply lotion between your toes.  Trim your toenails straight across. Do not dig under them or around the cuticle. File the edges of your nails with an emery board or nail file.  Do not cut corns or calluses or try to remove them with medicine.  Wear clean socks or stockings every day. Make sure they are not too tight. Do not wear knee-high stockings since they may decrease blood flow to your legs.  Wear shoes that fit properly and have enough cushioning. To break in new shoes, wear them for just a few hours a day. This prevents you from injuring your feet. Always look in your shoes before you put them on to be sure there are no objects inside.  Do not cross your legs. This may decrease the blood flow to your feet.  If you find a minor scrape,  cut, or break in the skin on your feet, keep it and the skin around it clean and dry. These areas may be cleansed with mild soap and water. Do not cleanse the area with peroxide, alcohol, or iodine.  When you remove an adhesive bandage, be sure not to damage the skin around it.  If you have a wound, look at it several times a day to make sure it is healing.  Do not use heating pads or hot water bottles. They may burn your skin. If you have lost feeling in your feet or legs, you may not know it is happening until it is too late.  Make sure your health care provider performs a complete foot exam at least annually or more often if you have foot problems. Report any cuts, sores, or bruises to your health care provider immediately. SEEK MEDICAL CARE IF:   You have an injury that is not healing.  You have cuts or breaks in the skin.  You have an ingrown nail.  You notice redness on your legs or feet.  You feel burning or tingling in your legs or feet.  You have pain or cramps in your legs and feet.  Your legs or feet are numb.  Your feet always feel cold. SEEK IMMEDIATE MEDICAL CARE IF:   There is increasing redness,   swelling, or pain in or around a wound.  There is a red line that goes up your leg.  Pus is coming from a wound.  You develop a fever or as directed by your health care provider.  You notice a bad smell coming from an ulcer or wound. Document Released: 06/22/2000 Document Revised: 02/25/2013 Document Reviewed: 12/02/2012 [employer] Patient Information 2014 [employer].  

## 2013-11-05 DIAGNOSIS — N186 End stage renal disease: Secondary | ICD-10-CM | POA: Diagnosis not present

## 2013-11-06 DIAGNOSIS — E119 Type 2 diabetes mellitus without complications: Secondary | ICD-10-CM | POA: Diagnosis not present

## 2013-11-06 DIAGNOSIS — D509 Iron deficiency anemia, unspecified: Secondary | ICD-10-CM | POA: Diagnosis not present

## 2013-11-06 DIAGNOSIS — N186 End stage renal disease: Secondary | ICD-10-CM | POA: Diagnosis not present

## 2013-11-06 DIAGNOSIS — Z992 Dependence on renal dialysis: Secondary | ICD-10-CM | POA: Diagnosis not present

## 2013-11-06 DIAGNOSIS — D631 Anemia in chronic kidney disease: Secondary | ICD-10-CM | POA: Diagnosis not present

## 2013-11-06 DIAGNOSIS — N039 Chronic nephritic syndrome with unspecified morphologic changes: Secondary | ICD-10-CM | POA: Diagnosis not present

## 2013-11-06 DIAGNOSIS — N2581 Secondary hyperparathyroidism of renal origin: Secondary | ICD-10-CM | POA: Diagnosis not present

## 2013-11-10 DIAGNOSIS — Z79899 Other long term (current) drug therapy: Secondary | ICD-10-CM | POA: Diagnosis not present

## 2013-11-10 DIAGNOSIS — E1149 Type 2 diabetes mellitus with other diabetic neurological complication: Secondary | ICD-10-CM | POA: Diagnosis not present

## 2013-11-10 DIAGNOSIS — I6990 Unspecified sequelae of unspecified cerebrovascular disease: Secondary | ICD-10-CM | POA: Diagnosis not present

## 2013-11-10 DIAGNOSIS — E1129 Type 2 diabetes mellitus with other diabetic kidney complication: Secondary | ICD-10-CM | POA: Diagnosis not present

## 2013-11-10 DIAGNOSIS — E1165 Type 2 diabetes mellitus with hyperglycemia: Secondary | ICD-10-CM | POA: Diagnosis not present

## 2013-11-17 DIAGNOSIS — H5500 Unspecified nystagmus: Secondary | ICD-10-CM | POA: Diagnosis not present

## 2013-11-17 DIAGNOSIS — H4011X0 Primary open-angle glaucoma, stage unspecified: Secondary | ICD-10-CM | POA: Diagnosis not present

## 2013-11-17 DIAGNOSIS — H26499 Other secondary cataract, unspecified eye: Secondary | ICD-10-CM | POA: Diagnosis not present

## 2013-11-17 DIAGNOSIS — Z961 Presence of intraocular lens: Secondary | ICD-10-CM | POA: Diagnosis not present

## 2013-11-25 DIAGNOSIS — E1129 Type 2 diabetes mellitus with other diabetic kidney complication: Secondary | ICD-10-CM | POA: Diagnosis not present

## 2013-12-06 DIAGNOSIS — N186 End stage renal disease: Secondary | ICD-10-CM | POA: Diagnosis not present

## 2013-12-07 DIAGNOSIS — D631 Anemia in chronic kidney disease: Secondary | ICD-10-CM | POA: Diagnosis not present

## 2013-12-07 DIAGNOSIS — N186 End stage renal disease: Secondary | ICD-10-CM | POA: Diagnosis not present

## 2013-12-07 DIAGNOSIS — E119 Type 2 diabetes mellitus without complications: Secondary | ICD-10-CM | POA: Diagnosis not present

## 2013-12-07 DIAGNOSIS — Z992 Dependence on renal dialysis: Secondary | ICD-10-CM | POA: Diagnosis not present

## 2013-12-07 DIAGNOSIS — D509 Iron deficiency anemia, unspecified: Secondary | ICD-10-CM | POA: Diagnosis not present

## 2013-12-07 DIAGNOSIS — N2581 Secondary hyperparathyroidism of renal origin: Secondary | ICD-10-CM | POA: Diagnosis not present

## 2013-12-15 DIAGNOSIS — I871 Compression of vein: Secondary | ICD-10-CM | POA: Diagnosis not present

## 2013-12-15 DIAGNOSIS — T82898A Other specified complication of vascular prosthetic devices, implants and grafts, initial encounter: Secondary | ICD-10-CM | POA: Diagnosis not present

## 2013-12-15 DIAGNOSIS — N186 End stage renal disease: Secondary | ICD-10-CM | POA: Diagnosis not present

## 2013-12-16 ENCOUNTER — Encounter: Payer: Self-pay | Admitting: Neurology

## 2014-01-05 DIAGNOSIS — N186 End stage renal disease: Secondary | ICD-10-CM | POA: Diagnosis not present

## 2014-01-06 DIAGNOSIS — N2581 Secondary hyperparathyroidism of renal origin: Secondary | ICD-10-CM | POA: Diagnosis not present

## 2014-01-06 DIAGNOSIS — E119 Type 2 diabetes mellitus without complications: Secondary | ICD-10-CM | POA: Diagnosis not present

## 2014-01-06 DIAGNOSIS — D631 Anemia in chronic kidney disease: Secondary | ICD-10-CM | POA: Diagnosis not present

## 2014-01-06 DIAGNOSIS — N186 End stage renal disease: Secondary | ICD-10-CM | POA: Diagnosis not present

## 2014-01-06 DIAGNOSIS — Z992 Dependence on renal dialysis: Secondary | ICD-10-CM | POA: Diagnosis not present

## 2014-01-06 DIAGNOSIS — D509 Iron deficiency anemia, unspecified: Secondary | ICD-10-CM | POA: Diagnosis not present

## 2014-01-26 ENCOUNTER — Ambulatory Visit: Payer: Medicare Other

## 2014-01-27 DIAGNOSIS — E1129 Type 2 diabetes mellitus with other diabetic kidney complication: Secondary | ICD-10-CM | POA: Diagnosis not present

## 2014-02-05 DIAGNOSIS — N186 End stage renal disease: Secondary | ICD-10-CM | POA: Diagnosis not present

## 2014-02-08 DIAGNOSIS — N186 End stage renal disease: Secondary | ICD-10-CM | POA: Diagnosis not present

## 2014-02-08 DIAGNOSIS — D631 Anemia in chronic kidney disease: Secondary | ICD-10-CM | POA: Diagnosis not present

## 2014-02-08 DIAGNOSIS — N039 Chronic nephritic syndrome with unspecified morphologic changes: Secondary | ICD-10-CM | POA: Diagnosis not present

## 2014-02-08 DIAGNOSIS — D509 Iron deficiency anemia, unspecified: Secondary | ICD-10-CM | POA: Diagnosis not present

## 2014-02-08 DIAGNOSIS — N2581 Secondary hyperparathyroidism of renal origin: Secondary | ICD-10-CM | POA: Diagnosis not present

## 2014-02-08 DIAGNOSIS — E119 Type 2 diabetes mellitus without complications: Secondary | ICD-10-CM | POA: Diagnosis not present

## 2014-02-08 DIAGNOSIS — Z992 Dependence on renal dialysis: Secondary | ICD-10-CM | POA: Diagnosis not present

## 2014-02-09 ENCOUNTER — Ambulatory Visit (INDEPENDENT_AMBULATORY_CARE_PROVIDER_SITE_OTHER): Payer: Medicare Other

## 2014-02-09 DIAGNOSIS — L608 Other nail disorders: Secondary | ICD-10-CM

## 2014-02-09 DIAGNOSIS — E1140 Type 2 diabetes mellitus with diabetic neuropathy, unspecified: Secondary | ICD-10-CM

## 2014-02-09 DIAGNOSIS — E1149 Type 2 diabetes mellitus with other diabetic neurological complication: Secondary | ICD-10-CM

## 2014-02-09 DIAGNOSIS — E1142 Type 2 diabetes mellitus with diabetic polyneuropathy: Secondary | ICD-10-CM

## 2014-02-09 DIAGNOSIS — Q828 Other specified congenital malformations of skin: Secondary | ICD-10-CM

## 2014-02-09 NOTE — Patient Instructions (Signed)
Diabetes and Foot Care Diabetes may cause you to have problems because of poor blood supply (circulation) to your feet and legs. This may cause the skin on your feet to become thinner, break easier, and heal more slowly. Your skin may become dry, and the skin may peel and crack. You may also have nerve damage in your legs and feet causing decreased feeling in them. You may not notice minor injuries to your feet that could lead to infections or more serious problems. Taking care of your feet is one of the most important things you can do for yourself.  HOME CARE INSTRUCTIONS  Wear shoes at all times, even in the house. Do not go barefoot. Bare feet are easily injured.  Check your feet daily for blisters, cuts, and redness. If you cannot see the bottom of your feet, use a mirror or ask someone for help.  Wash your feet with warm water (do not use hot water) and mild soap. Then pat your feet and the areas between your toes until they are completely dry. Do not soak your feet as this can dry your skin.  Apply a moisturizing lotion or petroleum jelly (that does not contain alcohol and is unscented) to the skin on your feet and to dry, brittle toenails. Do not apply lotion between your toes.  Trim your toenails straight across. Do not dig under them or around the cuticle. File the edges of your nails with an emery board or nail file.  Do not cut corns or calluses or try to remove them with medicine.  Wear clean socks or stockings every day. Make sure they are not too tight. Do not wear knee-high stockings since they may decrease blood flow to your legs.  Wear shoes that fit properly and have enough cushioning. To break in new shoes, wear them for just a few hours a day. This prevents you from injuring your feet. Always look in your shoes before you put them on to be sure there are no objects inside.  Do not cross your legs. This may decrease the blood flow to your feet.  If you find a minor scrape,  cut, or break in the skin on your feet, keep it and the skin around it clean and dry. These areas may be cleansed with mild soap and water. Do not cleanse the area with peroxide, alcohol, or iodine.  When you remove an adhesive bandage, be sure not to damage the skin around it.  If you have a wound, look at it several times a day to make sure it is healing.  Do not use heating pads or hot water bottles. They may burn your skin. If you have lost feeling in your feet or legs, you may not know it is happening until it is too late.  Make sure your health care provider performs a complete foot exam at least annually or more often if you have foot problems. Report any cuts, sores, or bruises to your health care provider immediately. SEEK MEDICAL CARE IF:   You have an injury that is not healing.  You have cuts or breaks in the skin.  You have an ingrown nail.  You notice redness on your legs or feet.  You feel burning or tingling in your legs or feet.  You have pain or cramps in your legs and feet.  Your legs or feet are numb.  Your feet always feel cold. SEEK IMMEDIATE MEDICAL CARE IF:   There is increasing redness,   swelling, or pain in or around a wound.  There is a red line that goes up your leg.  Pus is coming from a wound.  You develop a fever or as directed by your health care provider.  You notice a bad smell coming from an ulcer or wound. Document Released: 06/22/2000 Document Revised: 02/25/2013 Document Reviewed: 12/02/2012 Exit[employer] Patient Information 2015 [employer]. This information is not intended to replace advice given to you by your health care provider. Make sure you discuss any questions you have with your health care provider.  

## 2014-02-09 NOTE — Progress Notes (Signed)
   Subjective:    Patient ID: Shawn [name], male    DOB: 1960-11-21, 53 y.o.   MRN: [id number]  HPI Pt presents for nail debridement.   Review of Systems no new findings or systemic changes noted     Objective:   Physical Exam Lower extremity objective findings as follows vascular status is intact with pedal pulses palpable DP +2/4 bilateral PT one over 4 and diminished Refill time 4 seconds all digits skin temperature is warm turgor normal no edema rubor pallor or varicosities noted neurologically epicritic and proprioceptive sensations are diminished on Semmes Weinstein testing to forefoot digits and plantar arch. There is normal plantar response DTRs are listed has diffuse keratoses pinch callus the hallux bilateral and inferior left heel in sub-debriding these and utilizing a pumice stone. Nails thick brittle crumbly friable area no open wounds ulcerations no secondary infection is noted mild digital contractures HAV deformity noted as well.       Assessment & Plan:  Assessment diabetes with history peripheral neuropathy dystrophic probably mycotic nails debrided 1 through 5 bilateral. Patient is also candidate for diabetic accident shoes authorization from his primary physician Dr. Katherine Roan will be obtained patient may be candidate for one pair of extra depth shoes and multiple dual density Plastizote inlays. Followup for authorization for shoes been obtained otherwise 3 month followup for palliative nail care next  Harriet Masson DPM

## 2014-02-10 DIAGNOSIS — N186 End stage renal disease: Secondary | ICD-10-CM | POA: Diagnosis not present

## 2014-02-10 DIAGNOSIS — D631 Anemia in chronic kidney disease: Secondary | ICD-10-CM | POA: Diagnosis not present

## 2014-02-10 DIAGNOSIS — N2581 Secondary hyperparathyroidism of renal origin: Secondary | ICD-10-CM | POA: Diagnosis not present

## 2014-02-10 DIAGNOSIS — E119 Type 2 diabetes mellitus without complications: Secondary | ICD-10-CM | POA: Diagnosis not present

## 2014-02-10 DIAGNOSIS — D509 Iron deficiency anemia, unspecified: Secondary | ICD-10-CM | POA: Diagnosis not present

## 2014-02-10 DIAGNOSIS — Z992 Dependence on renal dialysis: Secondary | ICD-10-CM | POA: Diagnosis not present

## 2014-02-12 DIAGNOSIS — Z992 Dependence on renal dialysis: Secondary | ICD-10-CM | POA: Diagnosis not present

## 2014-02-12 DIAGNOSIS — N186 End stage renal disease: Secondary | ICD-10-CM | POA: Diagnosis not present

## 2014-02-12 DIAGNOSIS — D509 Iron deficiency anemia, unspecified: Secondary | ICD-10-CM | POA: Diagnosis not present

## 2014-02-12 DIAGNOSIS — E119 Type 2 diabetes mellitus without complications: Secondary | ICD-10-CM | POA: Diagnosis not present

## 2014-02-12 DIAGNOSIS — N2581 Secondary hyperparathyroidism of renal origin: Secondary | ICD-10-CM | POA: Diagnosis not present

## 2014-02-12 DIAGNOSIS — D631 Anemia in chronic kidney disease: Secondary | ICD-10-CM | POA: Diagnosis not present

## 2014-02-15 DIAGNOSIS — D509 Iron deficiency anemia, unspecified: Secondary | ICD-10-CM | POA: Diagnosis not present

## 2014-02-15 DIAGNOSIS — D631 Anemia in chronic kidney disease: Secondary | ICD-10-CM | POA: Diagnosis not present

## 2014-02-15 DIAGNOSIS — N2581 Secondary hyperparathyroidism of renal origin: Secondary | ICD-10-CM | POA: Diagnosis not present

## 2014-02-15 DIAGNOSIS — E119 Type 2 diabetes mellitus without complications: Secondary | ICD-10-CM | POA: Diagnosis not present

## 2014-02-15 DIAGNOSIS — N186 End stage renal disease: Secondary | ICD-10-CM | POA: Diagnosis not present

## 2014-02-15 DIAGNOSIS — Z992 Dependence on renal dialysis: Secondary | ICD-10-CM | POA: Diagnosis not present

## 2014-02-15 DIAGNOSIS — N039 Chronic nephritic syndrome with unspecified morphologic changes: Secondary | ICD-10-CM | POA: Diagnosis not present

## 2014-02-17 DIAGNOSIS — D509 Iron deficiency anemia, unspecified: Secondary | ICD-10-CM | POA: Diagnosis not present

## 2014-02-17 DIAGNOSIS — N186 End stage renal disease: Secondary | ICD-10-CM | POA: Diagnosis not present

## 2014-02-17 DIAGNOSIS — D631 Anemia in chronic kidney disease: Secondary | ICD-10-CM | POA: Diagnosis not present

## 2014-02-17 DIAGNOSIS — N2581 Secondary hyperparathyroidism of renal origin: Secondary | ICD-10-CM | POA: Diagnosis not present

## 2014-02-17 DIAGNOSIS — Z992 Dependence on renal dialysis: Secondary | ICD-10-CM | POA: Diagnosis not present

## 2014-02-17 DIAGNOSIS — E119 Type 2 diabetes mellitus without complications: Secondary | ICD-10-CM | POA: Diagnosis not present

## 2014-02-18 DIAGNOSIS — E7800 Pure hypercholesterolemia, unspecified: Secondary | ICD-10-CM | POA: Diagnosis not present

## 2014-02-18 DIAGNOSIS — E1129 Type 2 diabetes mellitus with other diabetic kidney complication: Secondary | ICD-10-CM | POA: Diagnosis not present

## 2014-02-18 DIAGNOSIS — I6990 Unspecified sequelae of unspecified cerebrovascular disease: Secondary | ICD-10-CM | POA: Diagnosis not present

## 2014-02-18 DIAGNOSIS — E1149 Type 2 diabetes mellitus with other diabetic neurological complication: Secondary | ICD-10-CM | POA: Diagnosis not present

## 2014-02-19 DIAGNOSIS — N2581 Secondary hyperparathyroidism of renal origin: Secondary | ICD-10-CM | POA: Diagnosis not present

## 2014-02-19 DIAGNOSIS — D631 Anemia in chronic kidney disease: Secondary | ICD-10-CM | POA: Diagnosis not present

## 2014-02-19 DIAGNOSIS — E119 Type 2 diabetes mellitus without complications: Secondary | ICD-10-CM | POA: Diagnosis not present

## 2014-02-19 DIAGNOSIS — Z992 Dependence on renal dialysis: Secondary | ICD-10-CM | POA: Diagnosis not present

## 2014-02-19 DIAGNOSIS — N186 End stage renal disease: Secondary | ICD-10-CM | POA: Diagnosis not present

## 2014-02-19 DIAGNOSIS — D509 Iron deficiency anemia, unspecified: Secondary | ICD-10-CM | POA: Diagnosis not present

## 2014-02-22 DIAGNOSIS — N186 End stage renal disease: Secondary | ICD-10-CM | POA: Diagnosis not present

## 2014-02-22 DIAGNOSIS — D631 Anemia in chronic kidney disease: Secondary | ICD-10-CM | POA: Diagnosis not present

## 2014-02-22 DIAGNOSIS — D509 Iron deficiency anemia, unspecified: Secondary | ICD-10-CM | POA: Diagnosis not present

## 2014-02-22 DIAGNOSIS — N2581 Secondary hyperparathyroidism of renal origin: Secondary | ICD-10-CM | POA: Diagnosis not present

## 2014-02-22 DIAGNOSIS — Z992 Dependence on renal dialysis: Secondary | ICD-10-CM | POA: Diagnosis not present

## 2014-02-22 DIAGNOSIS — E119 Type 2 diabetes mellitus without complications: Secondary | ICD-10-CM | POA: Diagnosis not present

## 2014-02-24 DIAGNOSIS — D631 Anemia in chronic kidney disease: Secondary | ICD-10-CM | POA: Diagnosis not present

## 2014-02-24 DIAGNOSIS — N186 End stage renal disease: Secondary | ICD-10-CM | POA: Diagnosis not present

## 2014-02-24 DIAGNOSIS — N2581 Secondary hyperparathyroidism of renal origin: Secondary | ICD-10-CM | POA: Diagnosis not present

## 2014-02-24 DIAGNOSIS — D509 Iron deficiency anemia, unspecified: Secondary | ICD-10-CM | POA: Diagnosis not present

## 2014-02-24 DIAGNOSIS — N039 Chronic nephritic syndrome with unspecified morphologic changes: Secondary | ICD-10-CM | POA: Diagnosis not present

## 2014-02-24 DIAGNOSIS — Z992 Dependence on renal dialysis: Secondary | ICD-10-CM | POA: Diagnosis not present

## 2014-02-24 DIAGNOSIS — E119 Type 2 diabetes mellitus without complications: Secondary | ICD-10-CM | POA: Diagnosis not present

## 2014-02-26 DIAGNOSIS — E119 Type 2 diabetes mellitus without complications: Secondary | ICD-10-CM | POA: Diagnosis not present

## 2014-02-26 DIAGNOSIS — N2581 Secondary hyperparathyroidism of renal origin: Secondary | ICD-10-CM | POA: Diagnosis not present

## 2014-02-26 DIAGNOSIS — D631 Anemia in chronic kidney disease: Secondary | ICD-10-CM | POA: Diagnosis not present

## 2014-02-26 DIAGNOSIS — Z992 Dependence on renal dialysis: Secondary | ICD-10-CM | POA: Diagnosis not present

## 2014-02-26 DIAGNOSIS — N039 Chronic nephritic syndrome with unspecified morphologic changes: Secondary | ICD-10-CM | POA: Diagnosis not present

## 2014-02-26 DIAGNOSIS — N186 End stage renal disease: Secondary | ICD-10-CM | POA: Diagnosis not present

## 2014-02-26 DIAGNOSIS — D509 Iron deficiency anemia, unspecified: Secondary | ICD-10-CM | POA: Diagnosis not present

## 2014-03-01 DIAGNOSIS — N2581 Secondary hyperparathyroidism of renal origin: Secondary | ICD-10-CM | POA: Diagnosis not present

## 2014-03-01 DIAGNOSIS — N186 End stage renal disease: Secondary | ICD-10-CM | POA: Diagnosis not present

## 2014-03-01 DIAGNOSIS — N039 Chronic nephritic syndrome with unspecified morphologic changes: Secondary | ICD-10-CM | POA: Diagnosis not present

## 2014-03-01 DIAGNOSIS — E119 Type 2 diabetes mellitus without complications: Secondary | ICD-10-CM | POA: Diagnosis not present

## 2014-03-01 DIAGNOSIS — D631 Anemia in chronic kidney disease: Secondary | ICD-10-CM | POA: Diagnosis not present

## 2014-03-01 DIAGNOSIS — Z992 Dependence on renal dialysis: Secondary | ICD-10-CM | POA: Diagnosis not present

## 2014-03-01 DIAGNOSIS — D509 Iron deficiency anemia, unspecified: Secondary | ICD-10-CM | POA: Diagnosis not present

## 2014-03-03 DIAGNOSIS — E119 Type 2 diabetes mellitus without complications: Secondary | ICD-10-CM | POA: Diagnosis not present

## 2014-03-03 DIAGNOSIS — D509 Iron deficiency anemia, unspecified: Secondary | ICD-10-CM | POA: Diagnosis not present

## 2014-03-03 DIAGNOSIS — D631 Anemia in chronic kidney disease: Secondary | ICD-10-CM | POA: Diagnosis not present

## 2014-03-03 DIAGNOSIS — N186 End stage renal disease: Secondary | ICD-10-CM | POA: Diagnosis not present

## 2014-03-03 DIAGNOSIS — N2581 Secondary hyperparathyroidism of renal origin: Secondary | ICD-10-CM | POA: Diagnosis not present

## 2014-03-03 DIAGNOSIS — Z992 Dependence on renal dialysis: Secondary | ICD-10-CM | POA: Diagnosis not present

## 2014-03-05 DIAGNOSIS — N2581 Secondary hyperparathyroidism of renal origin: Secondary | ICD-10-CM | POA: Diagnosis not present

## 2014-03-05 DIAGNOSIS — D631 Anemia in chronic kidney disease: Secondary | ICD-10-CM | POA: Diagnosis not present

## 2014-03-05 DIAGNOSIS — D509 Iron deficiency anemia, unspecified: Secondary | ICD-10-CM | POA: Diagnosis not present

## 2014-03-05 DIAGNOSIS — N186 End stage renal disease: Secondary | ICD-10-CM | POA: Diagnosis not present

## 2014-03-05 DIAGNOSIS — E119 Type 2 diabetes mellitus without complications: Secondary | ICD-10-CM | POA: Diagnosis not present

## 2014-03-05 DIAGNOSIS — Z992 Dependence on renal dialysis: Secondary | ICD-10-CM | POA: Diagnosis not present

## 2014-03-08 DIAGNOSIS — D509 Iron deficiency anemia, unspecified: Secondary | ICD-10-CM | POA: Diagnosis not present

## 2014-03-08 DIAGNOSIS — Z992 Dependence on renal dialysis: Secondary | ICD-10-CM | POA: Diagnosis not present

## 2014-03-08 DIAGNOSIS — E119 Type 2 diabetes mellitus without complications: Secondary | ICD-10-CM | POA: Diagnosis not present

## 2014-03-08 DIAGNOSIS — N186 End stage renal disease: Secondary | ICD-10-CM | POA: Diagnosis not present

## 2014-03-08 DIAGNOSIS — N2581 Secondary hyperparathyroidism of renal origin: Secondary | ICD-10-CM | POA: Diagnosis not present

## 2014-03-08 DIAGNOSIS — D631 Anemia in chronic kidney disease: Secondary | ICD-10-CM | POA: Diagnosis not present

## 2014-03-10 DIAGNOSIS — N039 Chronic nephritic syndrome with unspecified morphologic changes: Secondary | ICD-10-CM | POA: Diagnosis not present

## 2014-03-10 DIAGNOSIS — D631 Anemia in chronic kidney disease: Secondary | ICD-10-CM | POA: Diagnosis not present

## 2014-03-10 DIAGNOSIS — D509 Iron deficiency anemia, unspecified: Secondary | ICD-10-CM | POA: Diagnosis not present

## 2014-03-10 DIAGNOSIS — Z4931 Encounter for adequacy testing for hemodialysis: Secondary | ICD-10-CM | POA: Diagnosis not present

## 2014-03-10 DIAGNOSIS — N2581 Secondary hyperparathyroidism of renal origin: Secondary | ICD-10-CM | POA: Diagnosis not present

## 2014-03-10 DIAGNOSIS — Z992 Dependence on renal dialysis: Secondary | ICD-10-CM | POA: Diagnosis not present

## 2014-03-10 DIAGNOSIS — E119 Type 2 diabetes mellitus without complications: Secondary | ICD-10-CM | POA: Diagnosis not present

## 2014-03-10 DIAGNOSIS — N186 End stage renal disease: Secondary | ICD-10-CM | POA: Diagnosis not present

## 2014-03-25 DIAGNOSIS — R52 Pain, unspecified: Secondary | ICD-10-CM | POA: Insufficient documentation

## 2014-03-25 DIAGNOSIS — E1151 Type 2 diabetes mellitus with diabetic peripheral angiopathy without gangrene: Secondary | ICD-10-CM | POA: Insufficient documentation

## 2014-04-01 ENCOUNTER — Ambulatory Visit: Payer: Medicare Other | Admitting: Neurology

## 2014-04-07 DIAGNOSIS — N186 End stage renal disease: Secondary | ICD-10-CM | POA: Diagnosis not present

## 2014-04-09 DIAGNOSIS — Z4931 Encounter for adequacy testing for hemodialysis: Secondary | ICD-10-CM | POA: Diagnosis not present

## 2014-04-09 DIAGNOSIS — N2581 Secondary hyperparathyroidism of renal origin: Secondary | ICD-10-CM | POA: Diagnosis not present

## 2014-04-09 DIAGNOSIS — D509 Iron deficiency anemia, unspecified: Secondary | ICD-10-CM | POA: Diagnosis not present

## 2014-04-09 DIAGNOSIS — N186 End stage renal disease: Secondary | ICD-10-CM | POA: Diagnosis not present

## 2014-04-09 DIAGNOSIS — Z23 Encounter for immunization: Secondary | ICD-10-CM | POA: Diagnosis not present

## 2014-04-09 DIAGNOSIS — E119 Type 2 diabetes mellitus without complications: Secondary | ICD-10-CM | POA: Diagnosis not present

## 2014-04-09 DIAGNOSIS — D631 Anemia in chronic kidney disease: Secondary | ICD-10-CM | POA: Diagnosis not present

## 2014-04-20 DIAGNOSIS — H4720 Unspecified optic atrophy: Secondary | ICD-10-CM | POA: Diagnosis not present

## 2014-04-20 DIAGNOSIS — H31009 Unspecified chorioretinal scars, unspecified eye: Secondary | ICD-10-CM | POA: Diagnosis not present

## 2014-04-20 DIAGNOSIS — H5441 Blindness, right eye, normal vision left eye: Secondary | ICD-10-CM | POA: Diagnosis not present

## 2014-04-27 ENCOUNTER — Telehealth: Payer: Self-pay | Admitting: *Deleted

## 2014-04-27 NOTE — Telephone Encounter (Signed)
I called the patient and verified that Dr. Katherine Roan treats him for his Diabetes.  He stated yes.  I told him that it had been resubmitted to Dr. Katherine Roan today.  We will call once we receive the authorization.  He stated okay, I've called there 3 times and this is the first time I have heard something back."  I apologized for the mishap.

## 2014-04-27 NOTE — Telephone Encounter (Signed)
Message copied by Lolita Rieger on Tue Apr 27, 2014  4:45 PM ------      Message from: Cleon Gustin      Created: Tue Mar 30, 2014  3:22 PM      Regarding: Diabetic Shoes      Contact: [phone]       Patient is wanting to know the status of his doctor approving him to get diabetic shoes. States he called in August and has never heard anything from our office. Also stated he just recently saw his doctor and they stated they have not received anything from [place]. Please call the patient back!!!!!! Thank you. ------

## 2014-05-05 DIAGNOSIS — E1129 Type 2 diabetes mellitus with other diabetic kidney complication: Secondary | ICD-10-CM | POA: Diagnosis not present

## 2014-05-08 DIAGNOSIS — N186 End stage renal disease: Secondary | ICD-10-CM | POA: Diagnosis not present

## 2014-05-08 DIAGNOSIS — Z992 Dependence on renal dialysis: Secondary | ICD-10-CM | POA: Diagnosis not present

## 2014-05-10 DIAGNOSIS — N2581 Secondary hyperparathyroidism of renal origin: Secondary | ICD-10-CM | POA: Diagnosis not present

## 2014-05-10 DIAGNOSIS — D509 Iron deficiency anemia, unspecified: Secondary | ICD-10-CM | POA: Diagnosis not present

## 2014-05-10 DIAGNOSIS — E119 Type 2 diabetes mellitus without complications: Secondary | ICD-10-CM | POA: Diagnosis not present

## 2014-05-10 DIAGNOSIS — N186 End stage renal disease: Secondary | ICD-10-CM | POA: Diagnosis not present

## 2014-05-10 DIAGNOSIS — D631 Anemia in chronic kidney disease: Secondary | ICD-10-CM | POA: Diagnosis not present

## 2014-05-12 DIAGNOSIS — N2581 Secondary hyperparathyroidism of renal origin: Secondary | ICD-10-CM | POA: Diagnosis not present

## 2014-05-12 DIAGNOSIS — D509 Iron deficiency anemia, unspecified: Secondary | ICD-10-CM | POA: Diagnosis not present

## 2014-05-12 DIAGNOSIS — N186 End stage renal disease: Secondary | ICD-10-CM | POA: Diagnosis not present

## 2014-05-12 DIAGNOSIS — D631 Anemia in chronic kidney disease: Secondary | ICD-10-CM | POA: Diagnosis not present

## 2014-05-12 DIAGNOSIS — E119 Type 2 diabetes mellitus without complications: Secondary | ICD-10-CM | POA: Diagnosis not present

## 2014-05-14 DIAGNOSIS — D631 Anemia in chronic kidney disease: Secondary | ICD-10-CM | POA: Diagnosis not present

## 2014-05-14 DIAGNOSIS — N186 End stage renal disease: Secondary | ICD-10-CM | POA: Diagnosis not present

## 2014-05-14 DIAGNOSIS — D509 Iron deficiency anemia, unspecified: Secondary | ICD-10-CM | POA: Diagnosis not present

## 2014-05-14 DIAGNOSIS — N2581 Secondary hyperparathyroidism of renal origin: Secondary | ICD-10-CM | POA: Diagnosis not present

## 2014-05-14 DIAGNOSIS — E119 Type 2 diabetes mellitus without complications: Secondary | ICD-10-CM | POA: Diagnosis not present

## 2014-05-17 DIAGNOSIS — E119 Type 2 diabetes mellitus without complications: Secondary | ICD-10-CM | POA: Diagnosis not present

## 2014-05-17 DIAGNOSIS — N2581 Secondary hyperparathyroidism of renal origin: Secondary | ICD-10-CM | POA: Diagnosis not present

## 2014-05-17 DIAGNOSIS — D509 Iron deficiency anemia, unspecified: Secondary | ICD-10-CM | POA: Diagnosis not present

## 2014-05-17 DIAGNOSIS — D631 Anemia in chronic kidney disease: Secondary | ICD-10-CM | POA: Diagnosis not present

## 2014-05-17 DIAGNOSIS — N186 End stage renal disease: Secondary | ICD-10-CM | POA: Diagnosis not present

## 2014-05-18 ENCOUNTER — Ambulatory Visit (INDEPENDENT_AMBULATORY_CARE_PROVIDER_SITE_OTHER): Payer: Medicare Other

## 2014-05-18 ENCOUNTER — Ambulatory Visit: Payer: Medicare Other

## 2014-05-18 DIAGNOSIS — Q828 Other specified congenital malformations of skin: Secondary | ICD-10-CM

## 2014-05-18 DIAGNOSIS — E1140 Type 2 diabetes mellitus with diabetic neuropathy, unspecified: Secondary | ICD-10-CM | POA: Diagnosis not present

## 2014-05-18 DIAGNOSIS — M79673 Pain in unspecified foot: Secondary | ICD-10-CM | POA: Diagnosis not present

## 2014-05-18 DIAGNOSIS — B351 Tinea unguium: Secondary | ICD-10-CM

## 2014-05-18 NOTE — Progress Notes (Signed)
   Subjective:    Patient ID: Terric Korte, male    DOB: 01/26/1961, 53 y.o.   MRN: [id number]  HPI  Pt presents for nail debridement  Review of Systemsno new findings or systemic changes noted     Objective:   Physical Exam Vascular status reveals DP pulse +2 PT 1 over 4 bilateral capillary refill time 4 seconds. There is decreased sensation Semmes Weinstein to the forefoot digits and arch. Patient does have pinch calcium the hallux bilateral medial nail folds both hallux show hemorrhage a keratoses no active ulcer discharge or drainage noted some medial nail fold of the right hallux in particular painful tender symptomatic ptotic tissues are debrided at this time nails thick brittle chromic frontal dystrophic with discoloration and tenderness no open wounds no ulcers no secondary infections noted at this time. Nails thick and brittle will digital contractures HAV deformity are noted       Assessment & Plan:  Assessment diabetes history peripheral neuropathy. Mycotic nails debrided 1 through 5 bilateral this time also debridement multiple keratoses pinch callus both hallux with hemorrhage a keratoses or pre-ulceration noted no active ulcer noted still waiting on approval for diabetic shoes and custom inlays through 5 and continued palliative care is needed    Harriet Masson DPM

## 2014-05-19 DIAGNOSIS — D509 Iron deficiency anemia, unspecified: Secondary | ICD-10-CM | POA: Diagnosis not present

## 2014-05-19 DIAGNOSIS — N186 End stage renal disease: Secondary | ICD-10-CM | POA: Diagnosis not present

## 2014-05-19 DIAGNOSIS — N2581 Secondary hyperparathyroidism of renal origin: Secondary | ICD-10-CM | POA: Diagnosis not present

## 2014-05-19 DIAGNOSIS — D631 Anemia in chronic kidney disease: Secondary | ICD-10-CM | POA: Diagnosis not present

## 2014-05-19 DIAGNOSIS — E119 Type 2 diabetes mellitus without complications: Secondary | ICD-10-CM | POA: Diagnosis not present

## 2014-05-20 DIAGNOSIS — H4011X3 Primary open-angle glaucoma, severe stage: Secondary | ICD-10-CM | POA: Diagnosis not present

## 2014-05-20 DIAGNOSIS — H26492 Other secondary cataract, left eye: Secondary | ICD-10-CM | POA: Diagnosis not present

## 2014-05-20 DIAGNOSIS — E11359 Type 2 diabetes mellitus with proliferative diabetic retinopathy without macular edema: Secondary | ICD-10-CM | POA: Diagnosis not present

## 2014-05-20 DIAGNOSIS — E13359 Other specified diabetes mellitus with proliferative diabetic retinopathy without macular edema: Secondary | ICD-10-CM | POA: Diagnosis not present

## 2014-05-20 DIAGNOSIS — Z961 Presence of intraocular lens: Secondary | ICD-10-CM | POA: Diagnosis not present

## 2014-05-21 DIAGNOSIS — E119 Type 2 diabetes mellitus without complications: Secondary | ICD-10-CM | POA: Diagnosis not present

## 2014-05-21 DIAGNOSIS — N2581 Secondary hyperparathyroidism of renal origin: Secondary | ICD-10-CM | POA: Diagnosis not present

## 2014-05-21 DIAGNOSIS — N186 End stage renal disease: Secondary | ICD-10-CM | POA: Diagnosis not present

## 2014-05-21 DIAGNOSIS — D509 Iron deficiency anemia, unspecified: Secondary | ICD-10-CM | POA: Diagnosis not present

## 2014-05-21 DIAGNOSIS — D631 Anemia in chronic kidney disease: Secondary | ICD-10-CM | POA: Diagnosis not present

## 2014-05-24 DIAGNOSIS — D631 Anemia in chronic kidney disease: Secondary | ICD-10-CM | POA: Diagnosis not present

## 2014-05-24 DIAGNOSIS — N2581 Secondary hyperparathyroidism of renal origin: Secondary | ICD-10-CM | POA: Diagnosis not present

## 2014-05-24 DIAGNOSIS — N186 End stage renal disease: Secondary | ICD-10-CM | POA: Diagnosis not present

## 2014-05-24 DIAGNOSIS — D509 Iron deficiency anemia, unspecified: Secondary | ICD-10-CM | POA: Diagnosis not present

## 2014-05-24 DIAGNOSIS — E119 Type 2 diabetes mellitus without complications: Secondary | ICD-10-CM | POA: Diagnosis not present

## 2014-05-25 DIAGNOSIS — E1121 Type 2 diabetes mellitus with diabetic nephropathy: Secondary | ICD-10-CM | POA: Diagnosis not present

## 2014-05-25 DIAGNOSIS — I6990 Unspecified sequelae of unspecified cerebrovascular disease: Secondary | ICD-10-CM | POA: Diagnosis not present

## 2014-05-25 DIAGNOSIS — E11319 Type 2 diabetes mellitus with unspecified diabetic retinopathy without macular edema: Secondary | ICD-10-CM | POA: Diagnosis not present

## 2014-05-25 DIAGNOSIS — E1140 Type 2 diabetes mellitus with diabetic neuropathy, unspecified: Secondary | ICD-10-CM | POA: Diagnosis not present

## 2014-05-25 DIAGNOSIS — Z79899 Other long term (current) drug therapy: Secondary | ICD-10-CM | POA: Diagnosis not present

## 2014-05-26 DIAGNOSIS — D631 Anemia in chronic kidney disease: Secondary | ICD-10-CM | POA: Diagnosis not present

## 2014-05-26 DIAGNOSIS — E119 Type 2 diabetes mellitus without complications: Secondary | ICD-10-CM | POA: Diagnosis not present

## 2014-05-26 DIAGNOSIS — N186 End stage renal disease: Secondary | ICD-10-CM | POA: Diagnosis not present

## 2014-05-26 DIAGNOSIS — D509 Iron deficiency anemia, unspecified: Secondary | ICD-10-CM | POA: Diagnosis not present

## 2014-05-26 DIAGNOSIS — N2581 Secondary hyperparathyroidism of renal origin: Secondary | ICD-10-CM | POA: Diagnosis not present

## 2014-05-27 DIAGNOSIS — N186 End stage renal disease: Secondary | ICD-10-CM | POA: Diagnosis not present

## 2014-05-27 DIAGNOSIS — T82858A Stenosis of vascular prosthetic devices, implants and grafts, initial encounter: Secondary | ICD-10-CM | POA: Diagnosis not present

## 2014-05-27 DIAGNOSIS — Z992 Dependence on renal dialysis: Secondary | ICD-10-CM | POA: Diagnosis not present

## 2014-05-27 DIAGNOSIS — I871 Compression of vein: Secondary | ICD-10-CM | POA: Diagnosis not present

## 2014-05-28 DIAGNOSIS — E119 Type 2 diabetes mellitus without complications: Secondary | ICD-10-CM | POA: Diagnosis not present

## 2014-05-28 DIAGNOSIS — D631 Anemia in chronic kidney disease: Secondary | ICD-10-CM | POA: Diagnosis not present

## 2014-05-28 DIAGNOSIS — D509 Iron deficiency anemia, unspecified: Secondary | ICD-10-CM | POA: Diagnosis not present

## 2014-05-28 DIAGNOSIS — N186 End stage renal disease: Secondary | ICD-10-CM | POA: Diagnosis not present

## 2014-05-28 DIAGNOSIS — N2581 Secondary hyperparathyroidism of renal origin: Secondary | ICD-10-CM | POA: Diagnosis not present

## 2014-05-30 DIAGNOSIS — N186 End stage renal disease: Secondary | ICD-10-CM | POA: Diagnosis not present

## 2014-05-30 DIAGNOSIS — D631 Anemia in chronic kidney disease: Secondary | ICD-10-CM | POA: Diagnosis not present

## 2014-05-30 DIAGNOSIS — D509 Iron deficiency anemia, unspecified: Secondary | ICD-10-CM | POA: Diagnosis not present

## 2014-05-30 DIAGNOSIS — N2581 Secondary hyperparathyroidism of renal origin: Secondary | ICD-10-CM | POA: Diagnosis not present

## 2014-05-30 DIAGNOSIS — E119 Type 2 diabetes mellitus without complications: Secondary | ICD-10-CM | POA: Diagnosis not present

## 2014-06-01 DIAGNOSIS — D631 Anemia in chronic kidney disease: Secondary | ICD-10-CM | POA: Diagnosis not present

## 2014-06-01 DIAGNOSIS — N2581 Secondary hyperparathyroidism of renal origin: Secondary | ICD-10-CM | POA: Diagnosis not present

## 2014-06-01 DIAGNOSIS — E119 Type 2 diabetes mellitus without complications: Secondary | ICD-10-CM | POA: Diagnosis not present

## 2014-06-01 DIAGNOSIS — D509 Iron deficiency anemia, unspecified: Secondary | ICD-10-CM | POA: Diagnosis not present

## 2014-06-01 DIAGNOSIS — N186 End stage renal disease: Secondary | ICD-10-CM | POA: Diagnosis not present

## 2014-06-04 DIAGNOSIS — E119 Type 2 diabetes mellitus without complications: Secondary | ICD-10-CM | POA: Diagnosis not present

## 2014-06-04 DIAGNOSIS — N2581 Secondary hyperparathyroidism of renal origin: Secondary | ICD-10-CM | POA: Diagnosis not present

## 2014-06-04 DIAGNOSIS — N186 End stage renal disease: Secondary | ICD-10-CM | POA: Diagnosis not present

## 2014-06-04 DIAGNOSIS — D509 Iron deficiency anemia, unspecified: Secondary | ICD-10-CM | POA: Diagnosis not present

## 2014-06-04 DIAGNOSIS — D631 Anemia in chronic kidney disease: Secondary | ICD-10-CM | POA: Diagnosis not present

## 2014-06-07 DIAGNOSIS — E119 Type 2 diabetes mellitus without complications: Secondary | ICD-10-CM | POA: Diagnosis not present

## 2014-06-07 DIAGNOSIS — D631 Anemia in chronic kidney disease: Secondary | ICD-10-CM | POA: Diagnosis not present

## 2014-06-07 DIAGNOSIS — Z992 Dependence on renal dialysis: Secondary | ICD-10-CM | POA: Diagnosis not present

## 2014-06-07 DIAGNOSIS — D509 Iron deficiency anemia, unspecified: Secondary | ICD-10-CM | POA: Diagnosis not present

## 2014-06-07 DIAGNOSIS — N182 Chronic kidney disease, stage 2 (mild): Secondary | ICD-10-CM | POA: Diagnosis not present

## 2014-06-07 DIAGNOSIS — N2581 Secondary hyperparathyroidism of renal origin: Secondary | ICD-10-CM | POA: Diagnosis not present

## 2014-06-07 DIAGNOSIS — N186 End stage renal disease: Secondary | ICD-10-CM | POA: Diagnosis not present

## 2014-06-09 DIAGNOSIS — E119 Type 2 diabetes mellitus without complications: Secondary | ICD-10-CM | POA: Diagnosis not present

## 2014-06-09 DIAGNOSIS — D631 Anemia in chronic kidney disease: Secondary | ICD-10-CM | POA: Diagnosis not present

## 2014-06-09 DIAGNOSIS — D509 Iron deficiency anemia, unspecified: Secondary | ICD-10-CM | POA: Diagnosis not present

## 2014-06-09 DIAGNOSIS — N2581 Secondary hyperparathyroidism of renal origin: Secondary | ICD-10-CM | POA: Diagnosis not present

## 2014-06-09 DIAGNOSIS — N186 End stage renal disease: Secondary | ICD-10-CM | POA: Diagnosis not present

## 2014-06-10 DIAGNOSIS — H26492 Other secondary cataract, left eye: Secondary | ICD-10-CM | POA: Diagnosis not present

## 2014-06-25 ENCOUNTER — Ambulatory Visit: Payer: Medicare Other

## 2014-06-25 DIAGNOSIS — E1140 Type 2 diabetes mellitus with diabetic neuropathy, unspecified: Secondary | ICD-10-CM

## 2014-06-25 NOTE — Progress Notes (Signed)
Pt is here to be measred for diabetic shoes

## 2014-07-08 DIAGNOSIS — Z992 Dependence on renal dialysis: Secondary | ICD-10-CM | POA: Diagnosis not present

## 2014-07-08 DIAGNOSIS — N186 End stage renal disease: Secondary | ICD-10-CM | POA: Diagnosis not present

## 2014-07-10 DIAGNOSIS — N186 End stage renal disease: Secondary | ICD-10-CM | POA: Diagnosis not present

## 2014-07-10 DIAGNOSIS — D631 Anemia in chronic kidney disease: Secondary | ICD-10-CM | POA: Diagnosis not present

## 2014-07-10 DIAGNOSIS — D509 Iron deficiency anemia, unspecified: Secondary | ICD-10-CM | POA: Diagnosis not present

## 2014-07-10 DIAGNOSIS — N2581 Secondary hyperparathyroidism of renal origin: Secondary | ICD-10-CM | POA: Diagnosis not present

## 2014-07-10 DIAGNOSIS — E119 Type 2 diabetes mellitus without complications: Secondary | ICD-10-CM | POA: Diagnosis not present

## 2014-07-12 DIAGNOSIS — E119 Type 2 diabetes mellitus without complications: Secondary | ICD-10-CM | POA: Diagnosis not present

## 2014-07-12 DIAGNOSIS — D631 Anemia in chronic kidney disease: Secondary | ICD-10-CM | POA: Diagnosis not present

## 2014-07-12 DIAGNOSIS — N186 End stage renal disease: Secondary | ICD-10-CM | POA: Diagnosis not present

## 2014-07-12 DIAGNOSIS — N2581 Secondary hyperparathyroidism of renal origin: Secondary | ICD-10-CM | POA: Diagnosis not present

## 2014-07-12 DIAGNOSIS — D509 Iron deficiency anemia, unspecified: Secondary | ICD-10-CM | POA: Diagnosis not present

## 2014-07-14 DIAGNOSIS — N2581 Secondary hyperparathyroidism of renal origin: Secondary | ICD-10-CM | POA: Diagnosis not present

## 2014-07-14 DIAGNOSIS — N186 End stage renal disease: Secondary | ICD-10-CM | POA: Diagnosis not present

## 2014-07-14 DIAGNOSIS — D631 Anemia in chronic kidney disease: Secondary | ICD-10-CM | POA: Diagnosis not present

## 2014-07-14 DIAGNOSIS — D509 Iron deficiency anemia, unspecified: Secondary | ICD-10-CM | POA: Diagnosis not present

## 2014-07-14 DIAGNOSIS — E119 Type 2 diabetes mellitus without complications: Secondary | ICD-10-CM | POA: Diagnosis not present

## 2014-07-15 DIAGNOSIS — E8809 Other disorders of plasma-protein metabolism, not elsewhere classified: Secondary | ICD-10-CM | POA: Insufficient documentation

## 2014-07-16 DIAGNOSIS — D631 Anemia in chronic kidney disease: Secondary | ICD-10-CM | POA: Diagnosis not present

## 2014-07-16 DIAGNOSIS — N186 End stage renal disease: Secondary | ICD-10-CM | POA: Diagnosis not present

## 2014-07-16 DIAGNOSIS — E119 Type 2 diabetes mellitus without complications: Secondary | ICD-10-CM | POA: Diagnosis not present

## 2014-07-16 DIAGNOSIS — N2581 Secondary hyperparathyroidism of renal origin: Secondary | ICD-10-CM | POA: Diagnosis not present

## 2014-07-16 DIAGNOSIS — D509 Iron deficiency anemia, unspecified: Secondary | ICD-10-CM | POA: Diagnosis not present

## 2014-07-19 DIAGNOSIS — N186 End stage renal disease: Secondary | ICD-10-CM | POA: Diagnosis not present

## 2014-07-19 DIAGNOSIS — D631 Anemia in chronic kidney disease: Secondary | ICD-10-CM | POA: Diagnosis not present

## 2014-07-19 DIAGNOSIS — N2581 Secondary hyperparathyroidism of renal origin: Secondary | ICD-10-CM | POA: Diagnosis not present

## 2014-07-19 DIAGNOSIS — D509 Iron deficiency anemia, unspecified: Secondary | ICD-10-CM | POA: Diagnosis not present

## 2014-07-19 DIAGNOSIS — E119 Type 2 diabetes mellitus without complications: Secondary | ICD-10-CM | POA: Diagnosis not present

## 2014-07-21 DIAGNOSIS — D509 Iron deficiency anemia, unspecified: Secondary | ICD-10-CM | POA: Diagnosis not present

## 2014-07-21 DIAGNOSIS — E119 Type 2 diabetes mellitus without complications: Secondary | ICD-10-CM | POA: Diagnosis not present

## 2014-07-21 DIAGNOSIS — N2581 Secondary hyperparathyroidism of renal origin: Secondary | ICD-10-CM | POA: Diagnosis not present

## 2014-07-21 DIAGNOSIS — D631 Anemia in chronic kidney disease: Secondary | ICD-10-CM | POA: Diagnosis not present

## 2014-07-21 DIAGNOSIS — N186 End stage renal disease: Secondary | ICD-10-CM | POA: Diagnosis not present

## 2014-07-23 DIAGNOSIS — N2581 Secondary hyperparathyroidism of renal origin: Secondary | ICD-10-CM | POA: Diagnosis not present

## 2014-07-23 DIAGNOSIS — E119 Type 2 diabetes mellitus without complications: Secondary | ICD-10-CM | POA: Diagnosis not present

## 2014-07-23 DIAGNOSIS — D631 Anemia in chronic kidney disease: Secondary | ICD-10-CM | POA: Diagnosis not present

## 2014-07-23 DIAGNOSIS — D509 Iron deficiency anemia, unspecified: Secondary | ICD-10-CM | POA: Diagnosis not present

## 2014-07-23 DIAGNOSIS — N186 End stage renal disease: Secondary | ICD-10-CM | POA: Diagnosis not present

## 2014-07-26 DIAGNOSIS — N2581 Secondary hyperparathyroidism of renal origin: Secondary | ICD-10-CM | POA: Diagnosis not present

## 2014-07-26 DIAGNOSIS — D509 Iron deficiency anemia, unspecified: Secondary | ICD-10-CM | POA: Diagnosis not present

## 2014-07-26 DIAGNOSIS — N186 End stage renal disease: Secondary | ICD-10-CM | POA: Diagnosis not present

## 2014-07-26 DIAGNOSIS — E119 Type 2 diabetes mellitus without complications: Secondary | ICD-10-CM | POA: Diagnosis not present

## 2014-07-26 DIAGNOSIS — D631 Anemia in chronic kidney disease: Secondary | ICD-10-CM | POA: Diagnosis not present

## 2014-07-27 ENCOUNTER — Ambulatory Visit: Payer: Medicare Other | Attending: Ophthalmology | Admitting: Occupational Therapy

## 2014-07-27 DIAGNOSIS — H5410 Blindness, one eye, low vision other eye, unspecified eyes: Secondary | ICD-10-CM | POA: Diagnosis not present

## 2014-07-27 DIAGNOSIS — H53419 Scotoma involving central area, unspecified eye: Secondary | ICD-10-CM

## 2014-07-28 DIAGNOSIS — D509 Iron deficiency anemia, unspecified: Secondary | ICD-10-CM | POA: Diagnosis not present

## 2014-07-28 DIAGNOSIS — E119 Type 2 diabetes mellitus without complications: Secondary | ICD-10-CM | POA: Diagnosis not present

## 2014-07-28 DIAGNOSIS — D631 Anemia in chronic kidney disease: Secondary | ICD-10-CM | POA: Diagnosis not present

## 2014-07-28 DIAGNOSIS — N186 End stage renal disease: Secondary | ICD-10-CM | POA: Diagnosis not present

## 2014-07-28 DIAGNOSIS — N2581 Secondary hyperparathyroidism of renal origin: Secondary | ICD-10-CM | POA: Diagnosis not present

## 2014-07-28 NOTE — Therapy (Signed)
[facility] [address] [place], [place], [address] Phone: [phone]   Fax:  [phone]  Occupational Therapy Evaluation  Patient Details  Name: Shawn [name] MRN: [id number] Date of Birth: 1960-10-23 Referring Provider:  Hurman Horn, MD  Encounter Date: 07/27/2014      OT End of Session - 07/27/14 1013    Visit Number 1   Number of Visits 1   Authorization Type medicare   OT Start Time 0858   OT Stop Time 1008   OT Time Calculation (min) 70 min      Past Medical History  Diagnosis Date  . Hypertension   . Stroke   . Diabetes mellitus   . Arthritis   . No pertinent past medical history   . Blind right eye   . 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 Chronic kidney disease     Dialysis since 2011 MWF    Past Surgical History  Procedure Laterality Date  . Left arm graft  10/2010  . Hernia repair    . Insertion of dialysis catheter Right   . Eye surgery      retina surgery both eyes, cataract surgery both eyes  . Colonoscopy  06/2012  . Av fistula placement Right 05/12/2013    Procedure: INSERTION OF ARTERIOVENOUS (AV) GORE-TEX GRAFT ARM; ULTRASOUND GUIDED;  Surgeon: Conrad , MD;  Location: [facility];  Service: Vascular;  Laterality: Right;    There were no vitals taken for this visit.  Visit Diagnosis:  Central scotoma, unspecified laterality  Visual impairment: better eye severe, lesser eye total, unspecified laterality      Subjective Assessment - 07/27/14 0908    Currently in Pain? No/denies          [facility] OT Assessment - 07/28/14 0001    Assessment   Diagnosis macular degeneration, diabetic retinopathy   Onset Date 05/17/14  referred by Dr. Zadie Rhine   Assessment Pt presents with visual deficits which impede performance of ADLS/ IADLS.   Prior Therapy previous PT/OT s/p CVA   Precautions   Precautions Fall  low vision   Balance Screen    Has the patient fallen in the past 6 months No   Has the patient had a decrease in activity level because of a fear of falling?  Yes   Is the patient reluctant to leave their home because of a fear of falling?  Yes   Home  Environment   Family/patient expects to be discharged to: Private residence   Living Arrangements Spouse/significant other   Available Help at Discharge Family   Type of [place] One level   Bathroom Shower/Tub Tub/Shower unit   Lives With Spouse   ADL   Eating/Feeding Modified independent   Grooming Modified independent   Upper Body Bathing Modified independent  Pt sponge bathes if noone    Lower Body Bathing Modified independent   Upper Body Dressing Other (comment)  modified independent   Lower Body Dressing Modified independent   Toilet Tranfer Modified independent   Tub/Shower Transfer Minimal assistance   Tub/Shower Transfer Details (indicate cue type and reason Pt's wife assists pt with stepping over into the tub.   ADL comments Pt does not have a tub seat yet he can benefit   IADL   Meal Prep Able to complete simple warm meal prep  Pt cooks now using electric skillet modified independently  Mobility   Mobility Status --  ambulates with cane modified independently.   Mobility Status Comments needs assist to navigate an unfamiliar environment   Vision - History   Visual History Macular degeneration  diabetic retinopathy   Additional Comments Pt reports he previously received [facility].  Pt can benefit from books on tape and hi-marks.   Vision Assessment   Vision Assessment Vision tested   Visual Acuity Per MD/OD report   Per MD/OD Report OD NLP, OS 20/400   Reading Acuity --  less than 20/400, unable to read items on [place] text card   Acuity Other (comment)   Comment Pt does not use stove, electric skillet only.   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  Pt practiced using a tub transfer bench and he was provided with information for purchase. Therapist discussed home health PT with pt. As he reports new balance issues. Pt is in agreement, therapist will request MD to refer patient. Therapist will contact [facility] regarding books on tape and hi-marks. Pt is in agreement and gave therapist permission to contact them.                 OT Education - 07/28/14 1610    Education provided Yes   Education Details compensatory strategies for vision while ambulating, pouring cold liquids   Person(s) Educated Patient   Methods Explanation;Demonstration   Comprehension Verbalized understanding;Returned demonstration;Verbal cues required             OT Long Term Goals - 07/28/14 1617    OT LONG TERM GOAL #1   Title No goals set as pt was seen for eval and treatment day of evaluation.No additional OP OT recommended at this time. Pt can better benefit from services within his home environment. Pt can benefit from a home safety eval  and referral to PT for balance issues.    Baseline Therapist to contact [place] [facility] as pt can benefit from additional services.               Plan - 07/28/14 1612    Clinical Impression Statement Pt with macular degeneration/ diabetic retinopathy presents with visual impairment that impedes ADLS/ IADLS. Pt was seen for inital OT evaluation and treatment.    Rehab Potential Fair   Clinical Impairments Affecting Rehab Potential low vision, balance   OT Frequency One time visit   OT Duration Other (comment)   OT Treatment/Interventions Self-care/ADL training;Patient/family education;Therapeutic activities;Visual/perceptual remediation/compensation   Plan no further outpatient OT recommended, Pt can benefit from Home health PT for home  safety eval and therapist to contact [facility] for patient. Pt previously received services.   Recommended Other Services MD please refer for home health PT   Consulted and Agree with Plan of Care Patient          G-Codes - 08-06-2014 1016    Functional Assessment Tool Used difficulty performing self care and naviagating environment due to vision   Functional Limitation Self care   Self Care Current Status [id number]) At least 20 percent but less than 40 percent impaired, limited or restricted   Self Care Goal Status [id number]) At least 20 percent but less than 40 percent impaired, limited or restricted   Self Care Discharge Status [phone]) At  least 20 percent but less than 40 percent impaired, limited or restricted      Problem List Patient Active Problem List   Diagnosis Date Noted  . End stage renal disease 06/12/2013  . ESRD on dialysis 04/17/2013  . 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 Dizziness and giddiness 04/02/2013    RINE,KATHRYN 07/28/2014, 4:42 PM [name], OTR/L [phone] Phone: [phone] 4:42 PM 01/20/2016[facility] [address] [place], [place], [address] Phone: [phone]   Fax:  [phone]

## 2014-07-30 DIAGNOSIS — D509 Iron deficiency anemia, unspecified: Secondary | ICD-10-CM | POA: Diagnosis not present

## 2014-07-30 DIAGNOSIS — E119 Type 2 diabetes mellitus without complications: Secondary | ICD-10-CM | POA: Diagnosis not present

## 2014-07-30 DIAGNOSIS — N2581 Secondary hyperparathyroidism of renal origin: Secondary | ICD-10-CM | POA: Diagnosis not present

## 2014-07-30 DIAGNOSIS — N186 End stage renal disease: Secondary | ICD-10-CM | POA: Diagnosis not present

## 2014-07-30 DIAGNOSIS — D631 Anemia in chronic kidney disease: Secondary | ICD-10-CM | POA: Diagnosis not present

## 2014-08-02 DIAGNOSIS — N186 End stage renal disease: Secondary | ICD-10-CM | POA: Diagnosis not present

## 2014-08-02 DIAGNOSIS — E119 Type 2 diabetes mellitus without complications: Secondary | ICD-10-CM | POA: Diagnosis not present

## 2014-08-02 DIAGNOSIS — N2581 Secondary hyperparathyroidism of renal origin: Secondary | ICD-10-CM | POA: Diagnosis not present

## 2014-08-02 DIAGNOSIS — D509 Iron deficiency anemia, unspecified: Secondary | ICD-10-CM | POA: Diagnosis not present

## 2014-08-02 DIAGNOSIS — D631 Anemia in chronic kidney disease: Secondary | ICD-10-CM | POA: Diagnosis not present

## 2014-08-03 ENCOUNTER — Ambulatory Visit: Payer: Medicare Other

## 2014-08-03 ENCOUNTER — Ambulatory Visit (INDEPENDENT_AMBULATORY_CARE_PROVIDER_SITE_OTHER): Payer: Medicare Other

## 2014-08-03 DIAGNOSIS — M79673 Pain in unspecified foot: Secondary | ICD-10-CM

## 2014-08-03 DIAGNOSIS — Q828 Other specified congenital malformations of skin: Secondary | ICD-10-CM | POA: Diagnosis not present

## 2014-08-03 DIAGNOSIS — L603 Nail dystrophy: Secondary | ICD-10-CM | POA: Diagnosis not present

## 2014-08-03 DIAGNOSIS — B351 Tinea unguium: Secondary | ICD-10-CM

## 2014-08-03 DIAGNOSIS — E1140 Type 2 diabetes mellitus with diabetic neuropathy, unspecified: Secondary | ICD-10-CM

## 2014-08-03 NOTE — Progress Notes (Signed)
   Subjective:    Patient ID: Shawn [name], male    DOB: 1961-02-15, 53 y.o.   MRN: [id number]  HPI  pT PRESENTS FOR NAIL DEBRIDEMENT  Review of Systems no new findings or systemic changes noted    Objective:   Physical Exam Lower extremity objective findings unchanged faster status reveals pedal pulses DP plus one over 4 PT plus one over 4 bilateral Refill time 3 seconds patient does have some mild varicosities small edema some excoriations of the skin particular lateral malleoli are area right may have bumped. However does have some areas of eschar tissue from excoriations on skin on both lower extremities. Nails thick brittle [place] friable dystrophic 100 painful both on palpation and debridement at this time is a history of diabetes with decreased epicritic sensation Semmes Weinstein to the forefoot digits and arch. No open wounds no ulcers no secondary infections. Digital contractures associated keratoses are noted.       Assessment & Plan:  Assessment diabetes history peripheral neuropathy and angiopathy. Dystrophic nails are debrided painful mycotic nails and the presence of dystrophy discoloration and friability. No open wounds are identified however diabetic shoes are dispensed with possible 1 pair of shoes and 3 pairs of dual density Plastizote inlays the shoes inlays fit and contour well with full contact to the foot and arch. Patient is given instructions for shoewear and break and also nails debrided as instructed recheck in 3 months for follow-up and continued palliative care in the future as needed  Harriet Masson DPM

## 2014-08-04 DIAGNOSIS — E119 Type 2 diabetes mellitus without complications: Secondary | ICD-10-CM | POA: Diagnosis not present

## 2014-08-04 DIAGNOSIS — D509 Iron deficiency anemia, unspecified: Secondary | ICD-10-CM | POA: Diagnosis not present

## 2014-08-04 DIAGNOSIS — E1129 Type 2 diabetes mellitus with other diabetic kidney complication: Secondary | ICD-10-CM | POA: Diagnosis not present

## 2014-08-04 DIAGNOSIS — N2581 Secondary hyperparathyroidism of renal origin: Secondary | ICD-10-CM | POA: Diagnosis not present

## 2014-08-04 DIAGNOSIS — D631 Anemia in chronic kidney disease: Secondary | ICD-10-CM | POA: Diagnosis not present

## 2014-08-04 DIAGNOSIS — N186 End stage renal disease: Secondary | ICD-10-CM | POA: Diagnosis not present

## 2014-08-06 DIAGNOSIS — D509 Iron deficiency anemia, unspecified: Secondary | ICD-10-CM | POA: Diagnosis not present

## 2014-08-06 DIAGNOSIS — N186 End stage renal disease: Secondary | ICD-10-CM | POA: Diagnosis not present

## 2014-08-06 DIAGNOSIS — N2581 Secondary hyperparathyroidism of renal origin: Secondary | ICD-10-CM | POA: Diagnosis not present

## 2014-08-06 DIAGNOSIS — D631 Anemia in chronic kidney disease: Secondary | ICD-10-CM | POA: Diagnosis not present

## 2014-08-06 DIAGNOSIS — E119 Type 2 diabetes mellitus without complications: Secondary | ICD-10-CM | POA: Diagnosis not present

## 2014-08-08 DIAGNOSIS — Z992 Dependence on renal dialysis: Secondary | ICD-10-CM | POA: Diagnosis not present

## 2014-08-08 DIAGNOSIS — N186 End stage renal disease: Secondary | ICD-10-CM | POA: Diagnosis not present

## 2014-08-09 DIAGNOSIS — D631 Anemia in chronic kidney disease: Secondary | ICD-10-CM | POA: Diagnosis not present

## 2014-08-09 DIAGNOSIS — N2581 Secondary hyperparathyroidism of renal origin: Secondary | ICD-10-CM | POA: Diagnosis not present

## 2014-08-09 DIAGNOSIS — D509 Iron deficiency anemia, unspecified: Secondary | ICD-10-CM | POA: Diagnosis not present

## 2014-08-09 DIAGNOSIS — E119 Type 2 diabetes mellitus without complications: Secondary | ICD-10-CM | POA: Diagnosis not present

## 2014-08-09 DIAGNOSIS — N186 End stage renal disease: Secondary | ICD-10-CM | POA: Diagnosis not present

## 2014-08-10 ENCOUNTER — Ambulatory Visit (INDEPENDENT_AMBULATORY_CARE_PROVIDER_SITE_OTHER): Payer: Medicare Other | Admitting: Neurology

## 2014-08-10 ENCOUNTER — Encounter: Payer: Self-pay | Admitting: Neurology

## 2014-08-10 VITALS — BP 96/65 | HR 79 | Ht 71.0 in | Wt 206.0 lb

## 2014-08-10 DIAGNOSIS — I672 Cerebral atherosclerosis: Secondary | ICD-10-CM

## 2014-08-10 NOTE — Patient Instructions (Signed)
I had a long d/w patient and his wife about his remote stroke, risk for recurrent stroke/TIAs, personally independently reviewed imaging studies and stroke evaluation results and answered questions.Continue aspirin 325 mg orally every day  for secondary stroke prevention and maintain strict control of hypertension with blood pressure goal below 120/80, diabetes with hemoglobin A1c goal below 6.5% and lipids with LDL cholesterol goal below 100 mg/dL. I also advised the patient to eat a healthy diet with plenty of whole grains, cereals, fruits and vegetables, exercise regularly and maintain ideal body weight Followup in the future with Cecille Rubin, NP in 6 months

## 2014-08-10 NOTE — Progress Notes (Signed)
[facility]  PATIENT: Shawn [name] DOB: Sep 23, 1960   REASON FOR VISIT: Followup for stroke   HISTORY OF PRESENT ILLNESS: Shawn [name], 54 year old male returns for followup. He has history of stroke which occurred in April 2012 he is currently on aspirin 325 without further stroke or TIA symptoms. He goes to dialysis 3 times a week. He had a revision of his AV shunt in November. 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 He denies any recent falls , he ambulates with a single-point cane. He returns for reevaluation. He has vascular risk factors of hypertension,  Diabetes and  hyperlipidemia.  UPDATE 04/02/13 Shawn [name], 54 year old black male returns for followup. He has a history of stroke which occurred in April 2012. He is also on dialysis 3 times weekly. His shunt clotted after his dialysis yesterday. Blood pressure is stable today. Patient claims that his blood pressure medication was decreased he has had less orthostasis. He denies further stroke or TIA symptoms. 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 He is ambulating with a cane and denies any fallsTCD study 06/10/12 is consistent with abnormally elevated CBFV's through the majority arteries of the ant and post circulation. The cause of this finding could be multi-focal stenotic narrowing of cerebral vessels vs effects of systemic factors like anemia. Carotid doppler is negative for significant stenosis.  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 The patient presented with gait instability, vertigo and dizziness, which have resolved completely, except the patient still has some gait instability. But he can walk pretty well with a cane. 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 The patient had cataract surgery and retinal  repair on both of his eyes before the stroke. The patient has a PMH of end stage renal disease, and goes to dialysis 3x a week.  He Is seen urgently today following recent visit [facility] on 03/04/12 for dizziness. He woke up that day feeling dizzy and off-balance and with a sensation of body being pulled to the right side. This was noticed mainly when he was in the upright position and he felt better when he sat down or lay down. He denied any vertigo, nausea, diplopia. He did have some minor tingling in his right hand. His blood pressure was recorded as 133/85 in the ER but they did not check orthostatics. CBC and BMP were unremarkable. Patient states he had similar but milder episode today's later following dialysis when his blood pressure was recorded being low in the 80s. Similarly symptoms were noticeable when he was upright and improved when he lays flat. He complains of difficulty walking and exercising particularly on the days he gets dialysis as he feels quite tired. He had lipid profile checked last month by Dr. Katherine Roan and LDL was elevated but he cannot give me the exact numbers and I do not have those results. He continues to have poor vision annd uses a cane to walk.  Update 08/10/2014 : He returns for follow-up after last visit on 09/29/13. He continues to do well from neurovascular standpoint without recurrent stroke or TIA symptoms. He is tolerating aspirin without significant bleeding or bruising. He had lipid profile checked 3 months ago by his primary physician and it was fine. His blood  pressure usually runs quite low blood is low in office today. His last hemoglobin A1c was 6.8 done a few months ago. He had follow-up carotid ultrasound done on 10/07/13 which I personally reviewed showed no significant extra: Stenosis. Transcranial Doppler studies showed elevated velocities in the left middle cerebral artery suggestive of mild stenosis. Globally elevated pulsatility indexes and  suggested diffuse intracranial atherosclerosis. He continues to have poor vision in his eyes from diabetic retinopathy and uses a cane to ambulate.    REVIEW OF SYSTEMS: Full 14 system review of systems performed and notable only for those listed, all others are neg:  Vision loss only and all other systems negative ALLERGIES: No Known Allergies  HOME MEDICATIONS: Outpatient Prescriptions Prior to Visit  Medication Sig Dispense Refill  . aspirin 325 MG tablet Take 325 mg by mouth daily.    . B Complex Vitamins (B COMPLEX-B12) TABS Take 1 tablet by mouth daily.    . colesevelam (WELCHOL) 625 MG tablet Take 1,250 mg by mouth daily with supper.    . dorzolamide-timolol (COSOPT) 22.3-6.8 MG/ML ophthalmic solution Place 1 drop into the left eye 2 (two) times daily.    . folic acid-vitamin b complex-vitamin c-selenium-zinc (DIALYVITE) 3 MG TABS Take 1 tablet by mouth daily.    . insulin NPH-insulin regular (NOVOLIN 70/30) (70-30) 100 UNIT/ML injection Inject 15 Units into the skin 2 (two) times daily with a meal.    . lisinopril (PRINIVIL,ZESTRIL) 5 MG tablet Take 5 mg by mouth as needed.     Marland Kitchen oxyCODONE (ROXICODONE) 5 MG immediate release tablet Take 1-2 tablets (5-10 mg total) by mouth every 4 (four) hours as needed for severe pain. 40 tablet 0  . PRESCRIPTION MEDICATION Apply to eye. Eye drop.    . rosuvastatin (CRESTOR) 20 MG tablet Take 20 mg by mouth daily.    Marland Kitchen PRESCRIPTION MEDICATION Apply to eye. Eye drop.     No facility-administered medications prior to visit.    PAST MEDICAL HISTORY: Past Medical History  Diagnosis Date  . Hypertension   . Stroke   . Diabetes mellitus   . Arthritis   . No pertinent past medical history   . Blind right eye   . 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 Chronic kidney disease     Dialysis since 2011 MWF    PAST SURGICAL HISTORY: Past Surgical History  Procedure Laterality Date  . Left arm graft  10/2010    . Hernia repair    . Insertion of dialysis catheter Right   . Eye surgery      retina surgery both eyes, cataract surgery both eyes  . Colonoscopy  06/2012  . Av fistula placement Right 05/12/2013    Procedure: INSERTION OF ARTERIOVENOUS (AV) GORE-TEX GRAFT ARM; ULTRASOUND GUIDED;  Surgeon: Conrad [place], MD;  Location: [facility] OR;  Service: Vascular;  Laterality: Right;    FAMILY HISTORY: Family History  Problem Relation Age of Onset  . Cancer Sister   . Stroke Sister   . Diabetes Mother   . Alzheimer's disease Mother   . Cancer Father   . Heart disease Father     SOCIAL HISTORY: History   Social History  . Marital Status: Married    Spouse Name: N/A    Number of Children: 0  . Years of Education: 12th   Occupational History  .     Social History Main Topics  . Smoking status: Never Smoker   . Smokeless tobacco:  Never Used  . Alcohol Use: No  . Drug Use: No  . Sexual Activity: Not on file   Other Topics Concern  . Not on file   Social History Narrative   Patient lives at home with wife.    Patient is disabled.    Patient has no children.    Patient has a 12 grade education.            PHYSICAL EXAM  Filed Vitals:   08/10/14 1136  BP: 96/65  Pulse: 79  Height: 5\' 11"  (1.803 m)  Weight: 206 lb (93.441 kg)   Body mass index is 28.74 kg/(m^2).  Generalized: Well developed, in no acute distress  Head: normocephalic and atraumatic,. Oropharynx benign  Neck: Supple, no carotid bruits  Cardiac: Regular rate rhythm, no murmur    Neurological examination   Mentation: Alert oriented to time, place, history taking. Follows all commands speech and language fluent  Cranial nerve II-XII: Pupils are enlarged right greater than left and neither react. Conjugate eye movements are restricted on the left just moving medial and movement on the right, visual fields are restricted to confrontation on the left visual field testing central and medial vision field loss  laterally. Right full vision loss  Facial sensation and strength were normal. hearing was intact to finger rubbing bilaterally. Uvula tongue midline. head turning and shoulder shrug were normal and symmetric.Tongue protrusion into cheek strength was normal. Motor: normal bulk and tone, full strength in the BUE, BLE, fine finger movements normal, no pronator drift. No focal weakness Sensory: normal and symmetric to light touch, pinprick, and  vibration  Coordination: finger-nose-finger, heel-to-shin bilaterally, no dysmetria Reflexes: 1+ upper and lower and symmetric Gait and Station: Wide-based gait with cane, unsteady with tandem   DIAGNOSTIC DATA (LABS, IMAGING, TESTING) - I reviewed patient records, labs, notes, testing and imaging myself where available.  Lab Results  Component Value Date   WBC 8.3 03/04/2012   HGB 11.9* 05/12/2013   HCT 35.0* 05/12/2013   MCV 91.2 03/04/2012   PLT 320 03/04/2012      Component Value Date/Time   NA 135 05/12/2013 0728   K 3.7 05/12/2013 0728   CL 97 03/04/2012 1341   CO2 31 03/04/2012 1341   GLUCOSE 117* 05/12/2013 0728   BUN 28* 03/04/2012 1341   CREATININE 7.73* 03/04/2012 1341   CALCIUM 9.6 03/04/2012 1341   CALCIUM 7.7* 01/28/2010 1225   PROT 7.4 03/04/2012 1341   ALBUMIN 3.5 03/04/2012 1341   AST 9 03/04/2012 1341   ALT 7 03/04/2012 1341   ALKPHOS 113 03/04/2012 1341   BILITOT 0.3 03/04/2012 1341   GFRNONAA 7* 03/04/2012 1341   GFRAA 8* 03/04/2012 1341        ASSESSMENT AND PLAN  54 y.o. year old male  has a past medical history of Hypertension; Stroke; Diabetes mellitus;  Blind right eye; Unspecified cerebral artery occlusion with cerebral infarction (04/02/2013); and Chronic kidney disease. here to followup  I had a long d/w patient and his wife about his remote stroke, risk for recurrent stroke/TIAs, personally independently reviewed imaging studies and stroke evaluation results and answered questions.Continue aspirin 325 mg  orally every day  for secondary stroke prevention and maintain strict control of hypertension with blood pressure goal below 120/80, diabetes with hemoglobin A1c goal below 6.5% and lipids with LDL cholesterol goal below 100 mg/dL. I also advised the patient to eat a healthy diet with plenty of whole grains, cereals, fruits and vegetables, exercise  regularly and maintain ideal body weight Followup in the future with Cecille Rubin, NP in 6 months  Antony Contras, MD [facility] [address] [place], [place] [address] [phone]

## 2014-08-11 DIAGNOSIS — N2581 Secondary hyperparathyroidism of renal origin: Secondary | ICD-10-CM | POA: Diagnosis not present

## 2014-08-11 DIAGNOSIS — N186 End stage renal disease: Secondary | ICD-10-CM | POA: Diagnosis not present

## 2014-08-11 DIAGNOSIS — D631 Anemia in chronic kidney disease: Secondary | ICD-10-CM | POA: Diagnosis not present

## 2014-08-11 DIAGNOSIS — D509 Iron deficiency anemia, unspecified: Secondary | ICD-10-CM | POA: Diagnosis not present

## 2014-08-11 DIAGNOSIS — E119 Type 2 diabetes mellitus without complications: Secondary | ICD-10-CM | POA: Diagnosis not present

## 2014-08-12 DIAGNOSIS — I871 Compression of vein: Secondary | ICD-10-CM | POA: Diagnosis not present

## 2014-08-12 DIAGNOSIS — T82858D Stenosis of vascular prosthetic devices, implants and grafts, subsequent encounter: Secondary | ICD-10-CM | POA: Diagnosis not present

## 2014-08-12 DIAGNOSIS — Z992 Dependence on renal dialysis: Secondary | ICD-10-CM | POA: Diagnosis not present

## 2014-08-12 DIAGNOSIS — N186 End stage renal disease: Secondary | ICD-10-CM | POA: Diagnosis not present

## 2014-08-13 DIAGNOSIS — D631 Anemia in chronic kidney disease: Secondary | ICD-10-CM | POA: Diagnosis not present

## 2014-08-13 DIAGNOSIS — N2581 Secondary hyperparathyroidism of renal origin: Secondary | ICD-10-CM | POA: Diagnosis not present

## 2014-08-13 DIAGNOSIS — D509 Iron deficiency anemia, unspecified: Secondary | ICD-10-CM | POA: Diagnosis not present

## 2014-08-13 DIAGNOSIS — N186 End stage renal disease: Secondary | ICD-10-CM | POA: Diagnosis not present

## 2014-08-13 DIAGNOSIS — E119 Type 2 diabetes mellitus without complications: Secondary | ICD-10-CM | POA: Diagnosis not present

## 2014-08-16 DIAGNOSIS — N186 End stage renal disease: Secondary | ICD-10-CM | POA: Diagnosis not present

## 2014-08-16 DIAGNOSIS — D631 Anemia in chronic kidney disease: Secondary | ICD-10-CM | POA: Diagnosis not present

## 2014-08-16 DIAGNOSIS — N2581 Secondary hyperparathyroidism of renal origin: Secondary | ICD-10-CM | POA: Diagnosis not present

## 2014-08-16 DIAGNOSIS — E119 Type 2 diabetes mellitus without complications: Secondary | ICD-10-CM | POA: Diagnosis not present

## 2014-08-16 DIAGNOSIS — D509 Iron deficiency anemia, unspecified: Secondary | ICD-10-CM | POA: Diagnosis not present

## 2014-08-17 ENCOUNTER — Ambulatory Visit: Payer: Medicare Other

## 2014-08-18 DIAGNOSIS — D509 Iron deficiency anemia, unspecified: Secondary | ICD-10-CM | POA: Diagnosis not present

## 2014-08-18 DIAGNOSIS — N2581 Secondary hyperparathyroidism of renal origin: Secondary | ICD-10-CM | POA: Diagnosis not present

## 2014-08-18 DIAGNOSIS — D631 Anemia in chronic kidney disease: Secondary | ICD-10-CM | POA: Diagnosis not present

## 2014-08-18 DIAGNOSIS — E119 Type 2 diabetes mellitus without complications: Secondary | ICD-10-CM | POA: Diagnosis not present

## 2014-08-18 DIAGNOSIS — N186 End stage renal disease: Secondary | ICD-10-CM | POA: Diagnosis not present

## 2014-08-20 DIAGNOSIS — D631 Anemia in chronic kidney disease: Secondary | ICD-10-CM | POA: Diagnosis not present

## 2014-08-20 DIAGNOSIS — N2581 Secondary hyperparathyroidism of renal origin: Secondary | ICD-10-CM | POA: Diagnosis not present

## 2014-08-20 DIAGNOSIS — N186 End stage renal disease: Secondary | ICD-10-CM | POA: Diagnosis not present

## 2014-08-20 DIAGNOSIS — D509 Iron deficiency anemia, unspecified: Secondary | ICD-10-CM | POA: Diagnosis not present

## 2014-08-20 DIAGNOSIS — E119 Type 2 diabetes mellitus without complications: Secondary | ICD-10-CM | POA: Diagnosis not present

## 2014-08-23 DIAGNOSIS — D509 Iron deficiency anemia, unspecified: Secondary | ICD-10-CM | POA: Diagnosis not present

## 2014-08-23 DIAGNOSIS — N186 End stage renal disease: Secondary | ICD-10-CM | POA: Diagnosis not present

## 2014-08-23 DIAGNOSIS — D631 Anemia in chronic kidney disease: Secondary | ICD-10-CM | POA: Diagnosis not present

## 2014-08-23 DIAGNOSIS — N2581 Secondary hyperparathyroidism of renal origin: Secondary | ICD-10-CM | POA: Diagnosis not present

## 2014-08-23 DIAGNOSIS — E119 Type 2 diabetes mellitus without complications: Secondary | ICD-10-CM | POA: Diagnosis not present

## 2014-08-25 DIAGNOSIS — D631 Anemia in chronic kidney disease: Secondary | ICD-10-CM | POA: Diagnosis not present

## 2014-08-25 DIAGNOSIS — N2581 Secondary hyperparathyroidism of renal origin: Secondary | ICD-10-CM | POA: Diagnosis not present

## 2014-08-25 DIAGNOSIS — D509 Iron deficiency anemia, unspecified: Secondary | ICD-10-CM | POA: Diagnosis not present

## 2014-08-25 DIAGNOSIS — E119 Type 2 diabetes mellitus without complications: Secondary | ICD-10-CM | POA: Diagnosis not present

## 2014-08-25 DIAGNOSIS — N186 End stage renal disease: Secondary | ICD-10-CM | POA: Diagnosis not present

## 2014-08-27 DIAGNOSIS — D509 Iron deficiency anemia, unspecified: Secondary | ICD-10-CM | POA: Diagnosis not present

## 2014-08-27 DIAGNOSIS — N2581 Secondary hyperparathyroidism of renal origin: Secondary | ICD-10-CM | POA: Diagnosis not present

## 2014-08-27 DIAGNOSIS — D631 Anemia in chronic kidney disease: Secondary | ICD-10-CM | POA: Diagnosis not present

## 2014-08-27 DIAGNOSIS — N186 End stage renal disease: Secondary | ICD-10-CM | POA: Diagnosis not present

## 2014-08-27 DIAGNOSIS — E119 Type 2 diabetes mellitus without complications: Secondary | ICD-10-CM | POA: Diagnosis not present

## 2014-08-30 DIAGNOSIS — D631 Anemia in chronic kidney disease: Secondary | ICD-10-CM | POA: Diagnosis not present

## 2014-08-30 DIAGNOSIS — D509 Iron deficiency anemia, unspecified: Secondary | ICD-10-CM | POA: Diagnosis not present

## 2014-08-30 DIAGNOSIS — N2581 Secondary hyperparathyroidism of renal origin: Secondary | ICD-10-CM | POA: Diagnosis not present

## 2014-08-30 DIAGNOSIS — E119 Type 2 diabetes mellitus without complications: Secondary | ICD-10-CM | POA: Diagnosis not present

## 2014-08-30 DIAGNOSIS — N186 End stage renal disease: Secondary | ICD-10-CM | POA: Diagnosis not present

## 2014-08-31 DIAGNOSIS — E1165 Type 2 diabetes mellitus with hyperglycemia: Secondary | ICD-10-CM | POA: Diagnosis not present

## 2014-08-31 DIAGNOSIS — I6990 Unspecified sequelae of unspecified cerebrovascular disease: Secondary | ICD-10-CM | POA: Diagnosis not present

## 2014-08-31 DIAGNOSIS — Z79899 Other long term (current) drug therapy: Secondary | ICD-10-CM | POA: Diagnosis not present

## 2014-08-31 DIAGNOSIS — E1140 Type 2 diabetes mellitus with diabetic neuropathy, unspecified: Secondary | ICD-10-CM | POA: Diagnosis not present

## 2014-08-31 DIAGNOSIS — E780 Pure hypercholesterolemia: Secondary | ICD-10-CM | POA: Diagnosis not present

## 2014-09-01 DIAGNOSIS — D509 Iron deficiency anemia, unspecified: Secondary | ICD-10-CM | POA: Diagnosis not present

## 2014-09-01 DIAGNOSIS — D631 Anemia in chronic kidney disease: Secondary | ICD-10-CM | POA: Diagnosis not present

## 2014-09-01 DIAGNOSIS — E119 Type 2 diabetes mellitus without complications: Secondary | ICD-10-CM | POA: Diagnosis not present

## 2014-09-01 DIAGNOSIS — N186 End stage renal disease: Secondary | ICD-10-CM | POA: Diagnosis not present

## 2014-09-01 DIAGNOSIS — N2581 Secondary hyperparathyroidism of renal origin: Secondary | ICD-10-CM | POA: Diagnosis not present

## 2014-09-03 DIAGNOSIS — D509 Iron deficiency anemia, unspecified: Secondary | ICD-10-CM | POA: Diagnosis not present

## 2014-09-03 DIAGNOSIS — D631 Anemia in chronic kidney disease: Secondary | ICD-10-CM | POA: Diagnosis not present

## 2014-09-03 DIAGNOSIS — N186 End stage renal disease: Secondary | ICD-10-CM | POA: Diagnosis not present

## 2014-09-03 DIAGNOSIS — N2581 Secondary hyperparathyroidism of renal origin: Secondary | ICD-10-CM | POA: Diagnosis not present

## 2014-09-03 DIAGNOSIS — E119 Type 2 diabetes mellitus without complications: Secondary | ICD-10-CM | POA: Diagnosis not present

## 2014-09-06 DIAGNOSIS — N186 End stage renal disease: Secondary | ICD-10-CM | POA: Diagnosis not present

## 2014-09-06 DIAGNOSIS — D509 Iron deficiency anemia, unspecified: Secondary | ICD-10-CM | POA: Diagnosis not present

## 2014-09-06 DIAGNOSIS — Z992 Dependence on renal dialysis: Secondary | ICD-10-CM | POA: Diagnosis not present

## 2014-09-06 DIAGNOSIS — E119 Type 2 diabetes mellitus without complications: Secondary | ICD-10-CM | POA: Diagnosis not present

## 2014-09-06 DIAGNOSIS — N2581 Secondary hyperparathyroidism of renal origin: Secondary | ICD-10-CM | POA: Diagnosis not present

## 2014-09-06 DIAGNOSIS — D631 Anemia in chronic kidney disease: Secondary | ICD-10-CM | POA: Diagnosis not present

## 2014-09-08 DIAGNOSIS — D509 Iron deficiency anemia, unspecified: Secondary | ICD-10-CM | POA: Diagnosis not present

## 2014-09-08 DIAGNOSIS — N186 End stage renal disease: Secondary | ICD-10-CM | POA: Diagnosis not present

## 2014-09-08 DIAGNOSIS — E119 Type 2 diabetes mellitus without complications: Secondary | ICD-10-CM | POA: Diagnosis not present

## 2014-09-08 DIAGNOSIS — D631 Anemia in chronic kidney disease: Secondary | ICD-10-CM | POA: Diagnosis not present

## 2014-09-08 DIAGNOSIS — N2581 Secondary hyperparathyroidism of renal origin: Secondary | ICD-10-CM | POA: Diagnosis not present

## 2014-09-09 DIAGNOSIS — I871 Compression of vein: Secondary | ICD-10-CM | POA: Diagnosis not present

## 2014-09-09 DIAGNOSIS — N186 End stage renal disease: Secondary | ICD-10-CM | POA: Diagnosis not present

## 2014-09-09 DIAGNOSIS — T82858D Stenosis of vascular prosthetic devices, implants and grafts, subsequent encounter: Secondary | ICD-10-CM | POA: Diagnosis not present

## 2014-09-09 DIAGNOSIS — Z992 Dependence on renal dialysis: Secondary | ICD-10-CM | POA: Diagnosis not present

## 2014-09-10 DIAGNOSIS — E119 Type 2 diabetes mellitus without complications: Secondary | ICD-10-CM | POA: Diagnosis not present

## 2014-09-10 DIAGNOSIS — N186 End stage renal disease: Secondary | ICD-10-CM | POA: Diagnosis not present

## 2014-09-10 DIAGNOSIS — D631 Anemia in chronic kidney disease: Secondary | ICD-10-CM | POA: Diagnosis not present

## 2014-09-10 DIAGNOSIS — D509 Iron deficiency anemia, unspecified: Secondary | ICD-10-CM | POA: Diagnosis not present

## 2014-09-10 DIAGNOSIS — N2581 Secondary hyperparathyroidism of renal origin: Secondary | ICD-10-CM | POA: Diagnosis not present

## 2014-09-13 DIAGNOSIS — D631 Anemia in chronic kidney disease: Secondary | ICD-10-CM | POA: Diagnosis not present

## 2014-09-13 DIAGNOSIS — E119 Type 2 diabetes mellitus without complications: Secondary | ICD-10-CM | POA: Diagnosis not present

## 2014-09-13 DIAGNOSIS — D509 Iron deficiency anemia, unspecified: Secondary | ICD-10-CM | POA: Diagnosis not present

## 2014-09-13 DIAGNOSIS — N2581 Secondary hyperparathyroidism of renal origin: Secondary | ICD-10-CM | POA: Diagnosis not present

## 2014-09-13 DIAGNOSIS — N186 End stage renal disease: Secondary | ICD-10-CM | POA: Diagnosis not present

## 2014-09-15 DIAGNOSIS — E119 Type 2 diabetes mellitus without complications: Secondary | ICD-10-CM | POA: Diagnosis not present

## 2014-09-15 DIAGNOSIS — N186 End stage renal disease: Secondary | ICD-10-CM | POA: Diagnosis not present

## 2014-09-15 DIAGNOSIS — D509 Iron deficiency anemia, unspecified: Secondary | ICD-10-CM | POA: Diagnosis not present

## 2014-09-15 DIAGNOSIS — N2581 Secondary hyperparathyroidism of renal origin: Secondary | ICD-10-CM | POA: Diagnosis not present

## 2014-09-15 DIAGNOSIS — D631 Anemia in chronic kidney disease: Secondary | ICD-10-CM | POA: Diagnosis not present

## 2014-09-17 DIAGNOSIS — E119 Type 2 diabetes mellitus without complications: Secondary | ICD-10-CM | POA: Diagnosis not present

## 2014-09-17 DIAGNOSIS — N186 End stage renal disease: Secondary | ICD-10-CM | POA: Diagnosis not present

## 2014-09-17 DIAGNOSIS — D631 Anemia in chronic kidney disease: Secondary | ICD-10-CM | POA: Diagnosis not present

## 2014-09-17 DIAGNOSIS — N2581 Secondary hyperparathyroidism of renal origin: Secondary | ICD-10-CM | POA: Diagnosis not present

## 2014-09-17 DIAGNOSIS — D509 Iron deficiency anemia, unspecified: Secondary | ICD-10-CM | POA: Diagnosis not present

## 2014-09-20 DIAGNOSIS — D631 Anemia in chronic kidney disease: Secondary | ICD-10-CM | POA: Diagnosis not present

## 2014-09-20 DIAGNOSIS — N2581 Secondary hyperparathyroidism of renal origin: Secondary | ICD-10-CM | POA: Diagnosis not present

## 2014-09-20 DIAGNOSIS — D509 Iron deficiency anemia, unspecified: Secondary | ICD-10-CM | POA: Diagnosis not present

## 2014-09-20 DIAGNOSIS — N186 End stage renal disease: Secondary | ICD-10-CM | POA: Diagnosis not present

## 2014-09-20 DIAGNOSIS — E119 Type 2 diabetes mellitus without complications: Secondary | ICD-10-CM | POA: Diagnosis not present

## 2014-09-22 DIAGNOSIS — E119 Type 2 diabetes mellitus without complications: Secondary | ICD-10-CM | POA: Diagnosis not present

## 2014-09-22 DIAGNOSIS — N2581 Secondary hyperparathyroidism of renal origin: Secondary | ICD-10-CM | POA: Diagnosis not present

## 2014-09-22 DIAGNOSIS — D509 Iron deficiency anemia, unspecified: Secondary | ICD-10-CM | POA: Diagnosis not present

## 2014-09-22 DIAGNOSIS — N186 End stage renal disease: Secondary | ICD-10-CM | POA: Diagnosis not present

## 2014-09-22 DIAGNOSIS — D631 Anemia in chronic kidney disease: Secondary | ICD-10-CM | POA: Diagnosis not present

## 2014-09-24 DIAGNOSIS — N2581 Secondary hyperparathyroidism of renal origin: Secondary | ICD-10-CM | POA: Diagnosis not present

## 2014-09-24 DIAGNOSIS — E119 Type 2 diabetes mellitus without complications: Secondary | ICD-10-CM | POA: Diagnosis not present

## 2014-09-24 DIAGNOSIS — D631 Anemia in chronic kidney disease: Secondary | ICD-10-CM | POA: Diagnosis not present

## 2014-09-24 DIAGNOSIS — D509 Iron deficiency anemia, unspecified: Secondary | ICD-10-CM | POA: Diagnosis not present

## 2014-09-24 DIAGNOSIS — N186 End stage renal disease: Secondary | ICD-10-CM | POA: Diagnosis not present

## 2014-09-27 DIAGNOSIS — D631 Anemia in chronic kidney disease: Secondary | ICD-10-CM | POA: Diagnosis not present

## 2014-09-27 DIAGNOSIS — D509 Iron deficiency anemia, unspecified: Secondary | ICD-10-CM | POA: Diagnosis not present

## 2014-09-27 DIAGNOSIS — N2581 Secondary hyperparathyroidism of renal origin: Secondary | ICD-10-CM | POA: Diagnosis not present

## 2014-09-27 DIAGNOSIS — E119 Type 2 diabetes mellitus without complications: Secondary | ICD-10-CM | POA: Diagnosis not present

## 2014-09-27 DIAGNOSIS — N186 End stage renal disease: Secondary | ICD-10-CM | POA: Diagnosis not present

## 2014-09-29 DIAGNOSIS — D631 Anemia in chronic kidney disease: Secondary | ICD-10-CM | POA: Diagnosis not present

## 2014-09-29 DIAGNOSIS — E119 Type 2 diabetes mellitus without complications: Secondary | ICD-10-CM | POA: Diagnosis not present

## 2014-09-29 DIAGNOSIS — N186 End stage renal disease: Secondary | ICD-10-CM | POA: Diagnosis not present

## 2014-09-29 DIAGNOSIS — D509 Iron deficiency anemia, unspecified: Secondary | ICD-10-CM | POA: Diagnosis not present

## 2014-09-29 DIAGNOSIS — N2581 Secondary hyperparathyroidism of renal origin: Secondary | ICD-10-CM | POA: Diagnosis not present

## 2014-10-01 DIAGNOSIS — D631 Anemia in chronic kidney disease: Secondary | ICD-10-CM | POA: Diagnosis not present

## 2014-10-01 DIAGNOSIS — N186 End stage renal disease: Secondary | ICD-10-CM | POA: Diagnosis not present

## 2014-10-01 DIAGNOSIS — D509 Iron deficiency anemia, unspecified: Secondary | ICD-10-CM | POA: Diagnosis not present

## 2014-10-01 DIAGNOSIS — E119 Type 2 diabetes mellitus without complications: Secondary | ICD-10-CM | POA: Diagnosis not present

## 2014-10-01 DIAGNOSIS — N2581 Secondary hyperparathyroidism of renal origin: Secondary | ICD-10-CM | POA: Diagnosis not present

## 2014-10-04 DIAGNOSIS — D509 Iron deficiency anemia, unspecified: Secondary | ICD-10-CM | POA: Diagnosis not present

## 2014-10-04 DIAGNOSIS — D631 Anemia in chronic kidney disease: Secondary | ICD-10-CM | POA: Diagnosis not present

## 2014-10-04 DIAGNOSIS — T82858D Stenosis of vascular prosthetic devices, implants and grafts, subsequent encounter: Secondary | ICD-10-CM | POA: Diagnosis not present

## 2014-10-04 DIAGNOSIS — N186 End stage renal disease: Secondary | ICD-10-CM | POA: Diagnosis not present

## 2014-10-04 DIAGNOSIS — E119 Type 2 diabetes mellitus without complications: Secondary | ICD-10-CM | POA: Diagnosis not present

## 2014-10-04 DIAGNOSIS — I871 Compression of vein: Secondary | ICD-10-CM | POA: Diagnosis not present

## 2014-10-04 DIAGNOSIS — N2581 Secondary hyperparathyroidism of renal origin: Secondary | ICD-10-CM | POA: Diagnosis not present

## 2014-10-04 DIAGNOSIS — Z992 Dependence on renal dialysis: Secondary | ICD-10-CM | POA: Diagnosis not present

## 2014-10-06 DIAGNOSIS — N2581 Secondary hyperparathyroidism of renal origin: Secondary | ICD-10-CM | POA: Diagnosis not present

## 2014-10-06 DIAGNOSIS — N186 End stage renal disease: Secondary | ICD-10-CM | POA: Diagnosis not present

## 2014-10-06 DIAGNOSIS — E119 Type 2 diabetes mellitus without complications: Secondary | ICD-10-CM | POA: Diagnosis not present

## 2014-10-06 DIAGNOSIS — D631 Anemia in chronic kidney disease: Secondary | ICD-10-CM | POA: Diagnosis not present

## 2014-10-06 DIAGNOSIS — D509 Iron deficiency anemia, unspecified: Secondary | ICD-10-CM | POA: Diagnosis not present

## 2014-10-07 DIAGNOSIS — I120 Hypertensive chronic kidney disease with stage 5 chronic kidney disease or end stage renal disease: Secondary | ICD-10-CM | POA: Diagnosis not present

## 2014-10-07 DIAGNOSIS — Z992 Dependence on renal dialysis: Secondary | ICD-10-CM | POA: Diagnosis not present

## 2014-10-07 DIAGNOSIS — N186 End stage renal disease: Secondary | ICD-10-CM | POA: Diagnosis not present

## 2014-10-08 DIAGNOSIS — D509 Iron deficiency anemia, unspecified: Secondary | ICD-10-CM | POA: Diagnosis not present

## 2014-10-08 DIAGNOSIS — N2581 Secondary hyperparathyroidism of renal origin: Secondary | ICD-10-CM | POA: Diagnosis not present

## 2014-10-08 DIAGNOSIS — E119 Type 2 diabetes mellitus without complications: Secondary | ICD-10-CM | POA: Diagnosis not present

## 2014-10-08 DIAGNOSIS — D631 Anemia in chronic kidney disease: Secondary | ICD-10-CM | POA: Diagnosis not present

## 2014-10-08 DIAGNOSIS — N186 End stage renal disease: Secondary | ICD-10-CM | POA: Diagnosis not present

## 2014-10-11 DIAGNOSIS — D509 Iron deficiency anemia, unspecified: Secondary | ICD-10-CM | POA: Diagnosis not present

## 2014-10-11 DIAGNOSIS — N186 End stage renal disease: Secondary | ICD-10-CM | POA: Diagnosis not present

## 2014-10-11 DIAGNOSIS — N2581 Secondary hyperparathyroidism of renal origin: Secondary | ICD-10-CM | POA: Diagnosis not present

## 2014-10-11 DIAGNOSIS — E119 Type 2 diabetes mellitus without complications: Secondary | ICD-10-CM | POA: Diagnosis not present

## 2014-10-11 DIAGNOSIS — D631 Anemia in chronic kidney disease: Secondary | ICD-10-CM | POA: Diagnosis not present

## 2014-10-13 DIAGNOSIS — D631 Anemia in chronic kidney disease: Secondary | ICD-10-CM | POA: Diagnosis not present

## 2014-10-13 DIAGNOSIS — D509 Iron deficiency anemia, unspecified: Secondary | ICD-10-CM | POA: Diagnosis not present

## 2014-10-13 DIAGNOSIS — N2581 Secondary hyperparathyroidism of renal origin: Secondary | ICD-10-CM | POA: Diagnosis not present

## 2014-10-13 DIAGNOSIS — N186 End stage renal disease: Secondary | ICD-10-CM | POA: Diagnosis not present

## 2014-10-13 DIAGNOSIS — E119 Type 2 diabetes mellitus without complications: Secondary | ICD-10-CM | POA: Diagnosis not present

## 2014-10-15 DIAGNOSIS — D509 Iron deficiency anemia, unspecified: Secondary | ICD-10-CM | POA: Diagnosis not present

## 2014-10-15 DIAGNOSIS — E119 Type 2 diabetes mellitus without complications: Secondary | ICD-10-CM | POA: Diagnosis not present

## 2014-10-15 DIAGNOSIS — N186 End stage renal disease: Secondary | ICD-10-CM | POA: Diagnosis not present

## 2014-10-15 DIAGNOSIS — N2581 Secondary hyperparathyroidism of renal origin: Secondary | ICD-10-CM | POA: Diagnosis not present

## 2014-10-15 DIAGNOSIS — D631 Anemia in chronic kidney disease: Secondary | ICD-10-CM | POA: Diagnosis not present

## 2014-10-18 DIAGNOSIS — N2581 Secondary hyperparathyroidism of renal origin: Secondary | ICD-10-CM | POA: Diagnosis not present

## 2014-10-18 DIAGNOSIS — D631 Anemia in chronic kidney disease: Secondary | ICD-10-CM | POA: Diagnosis not present

## 2014-10-18 DIAGNOSIS — N186 End stage renal disease: Secondary | ICD-10-CM | POA: Diagnosis not present

## 2014-10-18 DIAGNOSIS — E119 Type 2 diabetes mellitus without complications: Secondary | ICD-10-CM | POA: Diagnosis not present

## 2014-10-18 DIAGNOSIS — D509 Iron deficiency anemia, unspecified: Secondary | ICD-10-CM | POA: Diagnosis not present

## 2014-10-19 DIAGNOSIS — E11319 Type 2 diabetes mellitus with unspecified diabetic retinopathy without macular edema: Secondary | ICD-10-CM | POA: Diagnosis not present

## 2014-10-19 DIAGNOSIS — H26491 Other secondary cataract, right eye: Secondary | ICD-10-CM | POA: Diagnosis not present

## 2014-10-19 DIAGNOSIS — E11359 Type 2 diabetes mellitus with proliferative diabetic retinopathy without macular edema: Secondary | ICD-10-CM | POA: Diagnosis not present

## 2014-10-19 DIAGNOSIS — H31092 Other chorioretinal scars, left eye: Secondary | ICD-10-CM | POA: Diagnosis not present

## 2014-10-19 DIAGNOSIS — Z961 Presence of intraocular lens: Secondary | ICD-10-CM | POA: Diagnosis not present

## 2014-10-19 DIAGNOSIS — H4011X3 Primary open-angle glaucoma, severe stage: Secondary | ICD-10-CM | POA: Diagnosis not present

## 2014-10-20 DIAGNOSIS — N186 End stage renal disease: Secondary | ICD-10-CM | POA: Diagnosis not present

## 2014-10-20 DIAGNOSIS — E119 Type 2 diabetes mellitus without complications: Secondary | ICD-10-CM | POA: Diagnosis not present

## 2014-10-20 DIAGNOSIS — D631 Anemia in chronic kidney disease: Secondary | ICD-10-CM | POA: Diagnosis not present

## 2014-10-20 DIAGNOSIS — D509 Iron deficiency anemia, unspecified: Secondary | ICD-10-CM | POA: Diagnosis not present

## 2014-10-20 DIAGNOSIS — N2581 Secondary hyperparathyroidism of renal origin: Secondary | ICD-10-CM | POA: Diagnosis not present

## 2014-10-22 DIAGNOSIS — D509 Iron deficiency anemia, unspecified: Secondary | ICD-10-CM | POA: Diagnosis not present

## 2014-10-22 DIAGNOSIS — N186 End stage renal disease: Secondary | ICD-10-CM | POA: Diagnosis not present

## 2014-10-22 DIAGNOSIS — E119 Type 2 diabetes mellitus without complications: Secondary | ICD-10-CM | POA: Diagnosis not present

## 2014-10-22 DIAGNOSIS — N2581 Secondary hyperparathyroidism of renal origin: Secondary | ICD-10-CM | POA: Diagnosis not present

## 2014-10-22 DIAGNOSIS — D631 Anemia in chronic kidney disease: Secondary | ICD-10-CM | POA: Diagnosis not present

## 2014-10-25 DIAGNOSIS — D631 Anemia in chronic kidney disease: Secondary | ICD-10-CM | POA: Diagnosis not present

## 2014-10-25 DIAGNOSIS — D509 Iron deficiency anemia, unspecified: Secondary | ICD-10-CM | POA: Diagnosis not present

## 2014-10-25 DIAGNOSIS — N2581 Secondary hyperparathyroidism of renal origin: Secondary | ICD-10-CM | POA: Diagnosis not present

## 2014-10-25 DIAGNOSIS — N186 End stage renal disease: Secondary | ICD-10-CM | POA: Diagnosis not present

## 2014-10-25 DIAGNOSIS — E119 Type 2 diabetes mellitus without complications: Secondary | ICD-10-CM | POA: Diagnosis not present

## 2014-10-27 DIAGNOSIS — D509 Iron deficiency anemia, unspecified: Secondary | ICD-10-CM | POA: Diagnosis not present

## 2014-10-27 DIAGNOSIS — E119 Type 2 diabetes mellitus without complications: Secondary | ICD-10-CM | POA: Diagnosis not present

## 2014-10-27 DIAGNOSIS — N186 End stage renal disease: Secondary | ICD-10-CM | POA: Diagnosis not present

## 2014-10-27 DIAGNOSIS — D631 Anemia in chronic kidney disease: Secondary | ICD-10-CM | POA: Diagnosis not present

## 2014-10-27 DIAGNOSIS — N2581 Secondary hyperparathyroidism of renal origin: Secondary | ICD-10-CM | POA: Diagnosis not present

## 2014-10-29 DIAGNOSIS — N2581 Secondary hyperparathyroidism of renal origin: Secondary | ICD-10-CM | POA: Diagnosis not present

## 2014-10-29 DIAGNOSIS — D509 Iron deficiency anemia, unspecified: Secondary | ICD-10-CM | POA: Diagnosis not present

## 2014-10-29 DIAGNOSIS — N186 End stage renal disease: Secondary | ICD-10-CM | POA: Diagnosis not present

## 2014-10-29 DIAGNOSIS — E119 Type 2 diabetes mellitus without complications: Secondary | ICD-10-CM | POA: Diagnosis not present

## 2014-10-29 DIAGNOSIS — D631 Anemia in chronic kidney disease: Secondary | ICD-10-CM | POA: Diagnosis not present

## 2014-11-01 DIAGNOSIS — N186 End stage renal disease: Secondary | ICD-10-CM | POA: Diagnosis not present

## 2014-11-01 DIAGNOSIS — N2581 Secondary hyperparathyroidism of renal origin: Secondary | ICD-10-CM | POA: Diagnosis not present

## 2014-11-01 DIAGNOSIS — D509 Iron deficiency anemia, unspecified: Secondary | ICD-10-CM | POA: Diagnosis not present

## 2014-11-01 DIAGNOSIS — E119 Type 2 diabetes mellitus without complications: Secondary | ICD-10-CM | POA: Diagnosis not present

## 2014-11-01 DIAGNOSIS — D631 Anemia in chronic kidney disease: Secondary | ICD-10-CM | POA: Diagnosis not present

## 2014-11-03 DIAGNOSIS — D631 Anemia in chronic kidney disease: Secondary | ICD-10-CM | POA: Diagnosis not present

## 2014-11-03 DIAGNOSIS — E119 Type 2 diabetes mellitus without complications: Secondary | ICD-10-CM | POA: Diagnosis not present

## 2014-11-03 DIAGNOSIS — N2581 Secondary hyperparathyroidism of renal origin: Secondary | ICD-10-CM | POA: Diagnosis not present

## 2014-11-03 DIAGNOSIS — N186 End stage renal disease: Secondary | ICD-10-CM | POA: Diagnosis not present

## 2014-11-03 DIAGNOSIS — D509 Iron deficiency anemia, unspecified: Secondary | ICD-10-CM | POA: Diagnosis not present

## 2014-11-03 DIAGNOSIS — E1129 Type 2 diabetes mellitus with other diabetic kidney complication: Secondary | ICD-10-CM | POA: Diagnosis not present

## 2014-11-05 DIAGNOSIS — E119 Type 2 diabetes mellitus without complications: Secondary | ICD-10-CM | POA: Diagnosis not present

## 2014-11-05 DIAGNOSIS — D509 Iron deficiency anemia, unspecified: Secondary | ICD-10-CM | POA: Diagnosis not present

## 2014-11-05 DIAGNOSIS — N2581 Secondary hyperparathyroidism of renal origin: Secondary | ICD-10-CM | POA: Diagnosis not present

## 2014-11-05 DIAGNOSIS — D631 Anemia in chronic kidney disease: Secondary | ICD-10-CM | POA: Diagnosis not present

## 2014-11-05 DIAGNOSIS — N186 End stage renal disease: Secondary | ICD-10-CM | POA: Diagnosis not present

## 2014-11-06 DIAGNOSIS — N186 End stage renal disease: Secondary | ICD-10-CM | POA: Diagnosis not present

## 2014-11-06 DIAGNOSIS — I120 Hypertensive chronic kidney disease with stage 5 chronic kidney disease or end stage renal disease: Secondary | ICD-10-CM | POA: Diagnosis not present

## 2014-11-06 DIAGNOSIS — Z992 Dependence on renal dialysis: Secondary | ICD-10-CM | POA: Diagnosis not present

## 2014-11-08 DIAGNOSIS — D509 Iron deficiency anemia, unspecified: Secondary | ICD-10-CM | POA: Diagnosis not present

## 2014-11-08 DIAGNOSIS — N2581 Secondary hyperparathyroidism of renal origin: Secondary | ICD-10-CM | POA: Diagnosis not present

## 2014-11-08 DIAGNOSIS — E119 Type 2 diabetes mellitus without complications: Secondary | ICD-10-CM | POA: Diagnosis not present

## 2014-11-08 DIAGNOSIS — E213 Hyperparathyroidism, unspecified: Secondary | ICD-10-CM | POA: Diagnosis not present

## 2014-11-08 DIAGNOSIS — N186 End stage renal disease: Secondary | ICD-10-CM | POA: Diagnosis not present

## 2014-11-08 DIAGNOSIS — D631 Anemia in chronic kidney disease: Secondary | ICD-10-CM | POA: Diagnosis not present

## 2014-11-10 DIAGNOSIS — D631 Anemia in chronic kidney disease: Secondary | ICD-10-CM | POA: Diagnosis not present

## 2014-11-10 DIAGNOSIS — E119 Type 2 diabetes mellitus without complications: Secondary | ICD-10-CM | POA: Diagnosis not present

## 2014-11-10 DIAGNOSIS — E213 Hyperparathyroidism, unspecified: Secondary | ICD-10-CM | POA: Diagnosis not present

## 2014-11-10 DIAGNOSIS — D509 Iron deficiency anemia, unspecified: Secondary | ICD-10-CM | POA: Diagnosis not present

## 2014-11-10 DIAGNOSIS — N2581 Secondary hyperparathyroidism of renal origin: Secondary | ICD-10-CM | POA: Diagnosis not present

## 2014-11-10 DIAGNOSIS — N186 End stage renal disease: Secondary | ICD-10-CM | POA: Diagnosis not present

## 2014-11-12 DIAGNOSIS — E119 Type 2 diabetes mellitus without complications: Secondary | ICD-10-CM | POA: Diagnosis not present

## 2014-11-12 DIAGNOSIS — N186 End stage renal disease: Secondary | ICD-10-CM | POA: Diagnosis not present

## 2014-11-12 DIAGNOSIS — D509 Iron deficiency anemia, unspecified: Secondary | ICD-10-CM | POA: Diagnosis not present

## 2014-11-12 DIAGNOSIS — D631 Anemia in chronic kidney disease: Secondary | ICD-10-CM | POA: Diagnosis not present

## 2014-11-12 DIAGNOSIS — E213 Hyperparathyroidism, unspecified: Secondary | ICD-10-CM | POA: Diagnosis not present

## 2014-11-12 DIAGNOSIS — N2581 Secondary hyperparathyroidism of renal origin: Secondary | ICD-10-CM | POA: Diagnosis not present

## 2014-11-15 DIAGNOSIS — N2581 Secondary hyperparathyroidism of renal origin: Secondary | ICD-10-CM | POA: Diagnosis not present

## 2014-11-15 DIAGNOSIS — D509 Iron deficiency anemia, unspecified: Secondary | ICD-10-CM | POA: Diagnosis not present

## 2014-11-15 DIAGNOSIS — D631 Anemia in chronic kidney disease: Secondary | ICD-10-CM | POA: Diagnosis not present

## 2014-11-15 DIAGNOSIS — E119 Type 2 diabetes mellitus without complications: Secondary | ICD-10-CM | POA: Diagnosis not present

## 2014-11-15 DIAGNOSIS — E213 Hyperparathyroidism, unspecified: Secondary | ICD-10-CM | POA: Diagnosis not present

## 2014-11-15 DIAGNOSIS — N186 End stage renal disease: Secondary | ICD-10-CM | POA: Diagnosis not present

## 2014-11-16 ENCOUNTER — Ambulatory Visit (INDEPENDENT_AMBULATORY_CARE_PROVIDER_SITE_OTHER): Payer: Medicare Other

## 2014-11-16 DIAGNOSIS — M79673 Pain in unspecified foot: Secondary | ICD-10-CM

## 2014-11-16 DIAGNOSIS — E1140 Type 2 diabetes mellitus with diabetic neuropathy, unspecified: Secondary | ICD-10-CM

## 2014-11-16 DIAGNOSIS — B351 Tinea unguium: Secondary | ICD-10-CM | POA: Diagnosis not present

## 2014-11-17 DIAGNOSIS — N186 End stage renal disease: Secondary | ICD-10-CM | POA: Diagnosis not present

## 2014-11-17 DIAGNOSIS — D631 Anemia in chronic kidney disease: Secondary | ICD-10-CM | POA: Diagnosis not present

## 2014-11-17 DIAGNOSIS — N2581 Secondary hyperparathyroidism of renal origin: Secondary | ICD-10-CM | POA: Diagnosis not present

## 2014-11-17 DIAGNOSIS — E213 Hyperparathyroidism, unspecified: Secondary | ICD-10-CM | POA: Diagnosis not present

## 2014-11-17 DIAGNOSIS — D509 Iron deficiency anemia, unspecified: Secondary | ICD-10-CM | POA: Diagnosis not present

## 2014-11-17 DIAGNOSIS — E119 Type 2 diabetes mellitus without complications: Secondary | ICD-10-CM | POA: Diagnosis not present

## 2014-11-19 DIAGNOSIS — N2581 Secondary hyperparathyroidism of renal origin: Secondary | ICD-10-CM | POA: Diagnosis not present

## 2014-11-19 DIAGNOSIS — D631 Anemia in chronic kidney disease: Secondary | ICD-10-CM | POA: Diagnosis not present

## 2014-11-19 DIAGNOSIS — D509 Iron deficiency anemia, unspecified: Secondary | ICD-10-CM | POA: Diagnosis not present

## 2014-11-19 DIAGNOSIS — E213 Hyperparathyroidism, unspecified: Secondary | ICD-10-CM | POA: Diagnosis not present

## 2014-11-19 DIAGNOSIS — N186 End stage renal disease: Secondary | ICD-10-CM | POA: Diagnosis not present

## 2014-11-19 DIAGNOSIS — E119 Type 2 diabetes mellitus without complications: Secondary | ICD-10-CM | POA: Diagnosis not present

## 2014-11-22 DIAGNOSIS — N186 End stage renal disease: Secondary | ICD-10-CM | POA: Diagnosis not present

## 2014-11-22 DIAGNOSIS — D631 Anemia in chronic kidney disease: Secondary | ICD-10-CM | POA: Diagnosis not present

## 2014-11-22 DIAGNOSIS — E213 Hyperparathyroidism, unspecified: Secondary | ICD-10-CM | POA: Diagnosis not present

## 2014-11-22 DIAGNOSIS — E119 Type 2 diabetes mellitus without complications: Secondary | ICD-10-CM | POA: Diagnosis not present

## 2014-11-22 DIAGNOSIS — D509 Iron deficiency anemia, unspecified: Secondary | ICD-10-CM | POA: Diagnosis not present

## 2014-11-22 DIAGNOSIS — N2581 Secondary hyperparathyroidism of renal origin: Secondary | ICD-10-CM | POA: Diagnosis not present

## 2014-11-24 DIAGNOSIS — E119 Type 2 diabetes mellitus without complications: Secondary | ICD-10-CM | POA: Diagnosis not present

## 2014-11-24 DIAGNOSIS — N186 End stage renal disease: Secondary | ICD-10-CM | POA: Diagnosis not present

## 2014-11-24 DIAGNOSIS — D509 Iron deficiency anemia, unspecified: Secondary | ICD-10-CM | POA: Diagnosis not present

## 2014-11-24 DIAGNOSIS — N2581 Secondary hyperparathyroidism of renal origin: Secondary | ICD-10-CM | POA: Diagnosis not present

## 2014-11-24 DIAGNOSIS — D631 Anemia in chronic kidney disease: Secondary | ICD-10-CM | POA: Diagnosis not present

## 2014-11-24 DIAGNOSIS — E213 Hyperparathyroidism, unspecified: Secondary | ICD-10-CM | POA: Diagnosis not present

## 2014-11-26 DIAGNOSIS — D509 Iron deficiency anemia, unspecified: Secondary | ICD-10-CM | POA: Diagnosis not present

## 2014-11-26 DIAGNOSIS — N2581 Secondary hyperparathyroidism of renal origin: Secondary | ICD-10-CM | POA: Diagnosis not present

## 2014-11-26 DIAGNOSIS — D631 Anemia in chronic kidney disease: Secondary | ICD-10-CM | POA: Diagnosis not present

## 2014-11-26 DIAGNOSIS — E213 Hyperparathyroidism, unspecified: Secondary | ICD-10-CM | POA: Diagnosis not present

## 2014-11-26 DIAGNOSIS — N186 End stage renal disease: Secondary | ICD-10-CM | POA: Diagnosis not present

## 2014-11-26 DIAGNOSIS — E119 Type 2 diabetes mellitus without complications: Secondary | ICD-10-CM | POA: Diagnosis not present

## 2014-11-29 DIAGNOSIS — N186 End stage renal disease: Secondary | ICD-10-CM | POA: Diagnosis not present

## 2014-11-29 DIAGNOSIS — D631 Anemia in chronic kidney disease: Secondary | ICD-10-CM | POA: Diagnosis not present

## 2014-11-29 DIAGNOSIS — E213 Hyperparathyroidism, unspecified: Secondary | ICD-10-CM | POA: Diagnosis not present

## 2014-11-29 DIAGNOSIS — D509 Iron deficiency anemia, unspecified: Secondary | ICD-10-CM | POA: Diagnosis not present

## 2014-11-29 DIAGNOSIS — N2581 Secondary hyperparathyroidism of renal origin: Secondary | ICD-10-CM | POA: Diagnosis not present

## 2014-11-29 DIAGNOSIS — E119 Type 2 diabetes mellitus without complications: Secondary | ICD-10-CM | POA: Diagnosis not present

## 2014-11-30 DIAGNOSIS — E780 Pure hypercholesterolemia: Secondary | ICD-10-CM | POA: Diagnosis not present

## 2014-11-30 DIAGNOSIS — I6990 Unspecified sequelae of unspecified cerebrovascular disease: Secondary | ICD-10-CM | POA: Diagnosis not present

## 2014-11-30 DIAGNOSIS — E1121 Type 2 diabetes mellitus with diabetic nephropathy: Secondary | ICD-10-CM | POA: Diagnosis not present

## 2014-11-30 DIAGNOSIS — E1165 Type 2 diabetes mellitus with hyperglycemia: Secondary | ICD-10-CM | POA: Diagnosis not present

## 2014-11-30 DIAGNOSIS — Z79899 Other long term (current) drug therapy: Secondary | ICD-10-CM | POA: Diagnosis not present

## 2014-12-01 DIAGNOSIS — Z992 Dependence on renal dialysis: Secondary | ICD-10-CM | POA: Diagnosis not present

## 2014-12-01 DIAGNOSIS — T82858D Stenosis of vascular prosthetic devices, implants and grafts, subsequent encounter: Secondary | ICD-10-CM | POA: Diagnosis not present

## 2014-12-01 DIAGNOSIS — I871 Compression of vein: Secondary | ICD-10-CM | POA: Diagnosis not present

## 2014-12-01 DIAGNOSIS — N186 End stage renal disease: Secondary | ICD-10-CM | POA: Diagnosis not present

## 2014-12-02 DIAGNOSIS — D509 Iron deficiency anemia, unspecified: Secondary | ICD-10-CM | POA: Diagnosis not present

## 2014-12-02 DIAGNOSIS — N186 End stage renal disease: Secondary | ICD-10-CM | POA: Diagnosis not present

## 2014-12-02 DIAGNOSIS — E213 Hyperparathyroidism, unspecified: Secondary | ICD-10-CM | POA: Diagnosis not present

## 2014-12-02 DIAGNOSIS — D631 Anemia in chronic kidney disease: Secondary | ICD-10-CM | POA: Diagnosis not present

## 2014-12-02 DIAGNOSIS — E119 Type 2 diabetes mellitus without complications: Secondary | ICD-10-CM | POA: Diagnosis not present

## 2014-12-02 DIAGNOSIS — N2581 Secondary hyperparathyroidism of renal origin: Secondary | ICD-10-CM | POA: Diagnosis not present

## 2014-12-03 DIAGNOSIS — E213 Hyperparathyroidism, unspecified: Secondary | ICD-10-CM | POA: Diagnosis not present

## 2014-12-03 DIAGNOSIS — D631 Anemia in chronic kidney disease: Secondary | ICD-10-CM | POA: Diagnosis not present

## 2014-12-03 DIAGNOSIS — E119 Type 2 diabetes mellitus without complications: Secondary | ICD-10-CM | POA: Diagnosis not present

## 2014-12-03 DIAGNOSIS — N2581 Secondary hyperparathyroidism of renal origin: Secondary | ICD-10-CM | POA: Diagnosis not present

## 2014-12-03 DIAGNOSIS — D509 Iron deficiency anemia, unspecified: Secondary | ICD-10-CM | POA: Diagnosis not present

## 2014-12-03 DIAGNOSIS — N186 End stage renal disease: Secondary | ICD-10-CM | POA: Diagnosis not present

## 2014-12-06 DIAGNOSIS — D631 Anemia in chronic kidney disease: Secondary | ICD-10-CM | POA: Diagnosis not present

## 2014-12-06 DIAGNOSIS — D509 Iron deficiency anemia, unspecified: Secondary | ICD-10-CM | POA: Diagnosis not present

## 2014-12-06 DIAGNOSIS — E119 Type 2 diabetes mellitus without complications: Secondary | ICD-10-CM | POA: Diagnosis not present

## 2014-12-06 DIAGNOSIS — N186 End stage renal disease: Secondary | ICD-10-CM | POA: Diagnosis not present

## 2014-12-06 DIAGNOSIS — N2581 Secondary hyperparathyroidism of renal origin: Secondary | ICD-10-CM | POA: Diagnosis not present

## 2014-12-06 DIAGNOSIS — E213 Hyperparathyroidism, unspecified: Secondary | ICD-10-CM | POA: Diagnosis not present

## 2014-12-07 DIAGNOSIS — Z992 Dependence on renal dialysis: Secondary | ICD-10-CM | POA: Diagnosis not present

## 2014-12-07 DIAGNOSIS — N186 End stage renal disease: Secondary | ICD-10-CM | POA: Diagnosis not present

## 2014-12-07 DIAGNOSIS — I120 Hypertensive chronic kidney disease with stage 5 chronic kidney disease or end stage renal disease: Secondary | ICD-10-CM | POA: Diagnosis not present

## 2014-12-08 DIAGNOSIS — E119 Type 2 diabetes mellitus without complications: Secondary | ICD-10-CM | POA: Diagnosis not present

## 2014-12-08 DIAGNOSIS — N186 End stage renal disease: Secondary | ICD-10-CM | POA: Diagnosis not present

## 2014-12-08 DIAGNOSIS — N2581 Secondary hyperparathyroidism of renal origin: Secondary | ICD-10-CM | POA: Diagnosis not present

## 2014-12-08 DIAGNOSIS — D509 Iron deficiency anemia, unspecified: Secondary | ICD-10-CM | POA: Diagnosis not present

## 2014-12-08 DIAGNOSIS — D631 Anemia in chronic kidney disease: Secondary | ICD-10-CM | POA: Diagnosis not present

## 2014-12-09 ENCOUNTER — Other Ambulatory Visit: Payer: Self-pay

## 2014-12-09 DIAGNOSIS — Z01810 Encounter for preprocedural cardiovascular examination: Secondary | ICD-10-CM

## 2014-12-09 DIAGNOSIS — N186 End stage renal disease: Secondary | ICD-10-CM

## 2014-12-10 DIAGNOSIS — E119 Type 2 diabetes mellitus without complications: Secondary | ICD-10-CM | POA: Diagnosis not present

## 2014-12-10 DIAGNOSIS — N186 End stage renal disease: Secondary | ICD-10-CM | POA: Diagnosis not present

## 2014-12-10 DIAGNOSIS — D509 Iron deficiency anemia, unspecified: Secondary | ICD-10-CM | POA: Diagnosis not present

## 2014-12-10 DIAGNOSIS — N2581 Secondary hyperparathyroidism of renal origin: Secondary | ICD-10-CM | POA: Diagnosis not present

## 2014-12-10 DIAGNOSIS — D631 Anemia in chronic kidney disease: Secondary | ICD-10-CM | POA: Diagnosis not present

## 2014-12-13 DIAGNOSIS — E119 Type 2 diabetes mellitus without complications: Secondary | ICD-10-CM | POA: Diagnosis not present

## 2014-12-13 DIAGNOSIS — N186 End stage renal disease: Secondary | ICD-10-CM | POA: Diagnosis not present

## 2014-12-13 DIAGNOSIS — D509 Iron deficiency anemia, unspecified: Secondary | ICD-10-CM | POA: Diagnosis not present

## 2014-12-13 DIAGNOSIS — N2581 Secondary hyperparathyroidism of renal origin: Secondary | ICD-10-CM | POA: Diagnosis not present

## 2014-12-13 DIAGNOSIS — D631 Anemia in chronic kidney disease: Secondary | ICD-10-CM | POA: Diagnosis not present

## 2014-12-15 DIAGNOSIS — E119 Type 2 diabetes mellitus without complications: Secondary | ICD-10-CM | POA: Diagnosis not present

## 2014-12-15 DIAGNOSIS — N2581 Secondary hyperparathyroidism of renal origin: Secondary | ICD-10-CM | POA: Diagnosis not present

## 2014-12-15 DIAGNOSIS — D509 Iron deficiency anemia, unspecified: Secondary | ICD-10-CM | POA: Diagnosis not present

## 2014-12-15 DIAGNOSIS — D631 Anemia in chronic kidney disease: Secondary | ICD-10-CM | POA: Diagnosis not present

## 2014-12-15 DIAGNOSIS — N186 End stage renal disease: Secondary | ICD-10-CM | POA: Diagnosis not present

## 2014-12-17 DIAGNOSIS — N2581 Secondary hyperparathyroidism of renal origin: Secondary | ICD-10-CM | POA: Diagnosis not present

## 2014-12-17 DIAGNOSIS — E119 Type 2 diabetes mellitus without complications: Secondary | ICD-10-CM | POA: Diagnosis not present

## 2014-12-17 DIAGNOSIS — N186 End stage renal disease: Secondary | ICD-10-CM | POA: Diagnosis not present

## 2014-12-17 DIAGNOSIS — D509 Iron deficiency anemia, unspecified: Secondary | ICD-10-CM | POA: Diagnosis not present

## 2014-12-17 DIAGNOSIS — D631 Anemia in chronic kidney disease: Secondary | ICD-10-CM | POA: Diagnosis not present

## 2014-12-20 DIAGNOSIS — N186 End stage renal disease: Secondary | ICD-10-CM | POA: Diagnosis not present

## 2014-12-20 DIAGNOSIS — D509 Iron deficiency anemia, unspecified: Secondary | ICD-10-CM | POA: Diagnosis not present

## 2014-12-20 DIAGNOSIS — D631 Anemia in chronic kidney disease: Secondary | ICD-10-CM | POA: Diagnosis not present

## 2014-12-20 DIAGNOSIS — E119 Type 2 diabetes mellitus without complications: Secondary | ICD-10-CM | POA: Diagnosis not present

## 2014-12-20 DIAGNOSIS — N2581 Secondary hyperparathyroidism of renal origin: Secondary | ICD-10-CM | POA: Diagnosis not present

## 2014-12-21 DIAGNOSIS — H5441 Blindness, right eye, normal vision left eye: Secondary | ICD-10-CM | POA: Diagnosis not present

## 2014-12-21 DIAGNOSIS — E11359 Type 2 diabetes mellitus with proliferative diabetic retinopathy without macular edema: Secondary | ICD-10-CM | POA: Diagnosis not present

## 2014-12-22 DIAGNOSIS — N186 End stage renal disease: Secondary | ICD-10-CM | POA: Diagnosis not present

## 2014-12-22 DIAGNOSIS — E119 Type 2 diabetes mellitus without complications: Secondary | ICD-10-CM | POA: Diagnosis not present

## 2014-12-22 DIAGNOSIS — D509 Iron deficiency anemia, unspecified: Secondary | ICD-10-CM | POA: Diagnosis not present

## 2014-12-22 DIAGNOSIS — N2581 Secondary hyperparathyroidism of renal origin: Secondary | ICD-10-CM | POA: Diagnosis not present

## 2014-12-22 DIAGNOSIS — D631 Anemia in chronic kidney disease: Secondary | ICD-10-CM | POA: Diagnosis not present

## 2014-12-24 DIAGNOSIS — N186 End stage renal disease: Secondary | ICD-10-CM | POA: Diagnosis not present

## 2014-12-24 DIAGNOSIS — N2581 Secondary hyperparathyroidism of renal origin: Secondary | ICD-10-CM | POA: Diagnosis not present

## 2014-12-24 DIAGNOSIS — D631 Anemia in chronic kidney disease: Secondary | ICD-10-CM | POA: Diagnosis not present

## 2014-12-24 DIAGNOSIS — E119 Type 2 diabetes mellitus without complications: Secondary | ICD-10-CM | POA: Diagnosis not present

## 2014-12-24 DIAGNOSIS — D509 Iron deficiency anemia, unspecified: Secondary | ICD-10-CM | POA: Diagnosis not present

## 2014-12-27 DIAGNOSIS — N2581 Secondary hyperparathyroidism of renal origin: Secondary | ICD-10-CM | POA: Diagnosis not present

## 2014-12-27 DIAGNOSIS — D509 Iron deficiency anemia, unspecified: Secondary | ICD-10-CM | POA: Diagnosis not present

## 2014-12-27 DIAGNOSIS — D631 Anemia in chronic kidney disease: Secondary | ICD-10-CM | POA: Diagnosis not present

## 2014-12-27 DIAGNOSIS — E119 Type 2 diabetes mellitus without complications: Secondary | ICD-10-CM | POA: Diagnosis not present

## 2014-12-27 DIAGNOSIS — N186 End stage renal disease: Secondary | ICD-10-CM | POA: Diagnosis not present

## 2014-12-29 DIAGNOSIS — N2581 Secondary hyperparathyroidism of renal origin: Secondary | ICD-10-CM | POA: Diagnosis not present

## 2014-12-29 DIAGNOSIS — D631 Anemia in chronic kidney disease: Secondary | ICD-10-CM | POA: Diagnosis not present

## 2014-12-29 DIAGNOSIS — N186 End stage renal disease: Secondary | ICD-10-CM | POA: Diagnosis not present

## 2014-12-29 DIAGNOSIS — D509 Iron deficiency anemia, unspecified: Secondary | ICD-10-CM | POA: Diagnosis not present

## 2014-12-29 DIAGNOSIS — E119 Type 2 diabetes mellitus without complications: Secondary | ICD-10-CM | POA: Diagnosis not present

## 2014-12-31 DIAGNOSIS — D631 Anemia in chronic kidney disease: Secondary | ICD-10-CM | POA: Diagnosis not present

## 2014-12-31 DIAGNOSIS — E119 Type 2 diabetes mellitus without complications: Secondary | ICD-10-CM | POA: Diagnosis not present

## 2014-12-31 DIAGNOSIS — N2581 Secondary hyperparathyroidism of renal origin: Secondary | ICD-10-CM | POA: Diagnosis not present

## 2014-12-31 DIAGNOSIS — N186 End stage renal disease: Secondary | ICD-10-CM | POA: Diagnosis not present

## 2014-12-31 DIAGNOSIS — D509 Iron deficiency anemia, unspecified: Secondary | ICD-10-CM | POA: Diagnosis not present

## 2015-01-03 DIAGNOSIS — D631 Anemia in chronic kidney disease: Secondary | ICD-10-CM | POA: Diagnosis not present

## 2015-01-03 DIAGNOSIS — N2581 Secondary hyperparathyroidism of renal origin: Secondary | ICD-10-CM | POA: Diagnosis not present

## 2015-01-03 DIAGNOSIS — D509 Iron deficiency anemia, unspecified: Secondary | ICD-10-CM | POA: Diagnosis not present

## 2015-01-03 DIAGNOSIS — E119 Type 2 diabetes mellitus without complications: Secondary | ICD-10-CM | POA: Diagnosis not present

## 2015-01-03 DIAGNOSIS — N186 End stage renal disease: Secondary | ICD-10-CM | POA: Diagnosis not present

## 2015-01-05 ENCOUNTER — Encounter: Payer: Self-pay | Admitting: Vascular Surgery

## 2015-01-05 DIAGNOSIS — E119 Type 2 diabetes mellitus without complications: Secondary | ICD-10-CM | POA: Diagnosis not present

## 2015-01-05 DIAGNOSIS — N2581 Secondary hyperparathyroidism of renal origin: Secondary | ICD-10-CM | POA: Diagnosis not present

## 2015-01-05 DIAGNOSIS — D509 Iron deficiency anemia, unspecified: Secondary | ICD-10-CM | POA: Diagnosis not present

## 2015-01-05 DIAGNOSIS — D631 Anemia in chronic kidney disease: Secondary | ICD-10-CM | POA: Diagnosis not present

## 2015-01-05 DIAGNOSIS — N186 End stage renal disease: Secondary | ICD-10-CM | POA: Diagnosis not present

## 2015-01-06 DIAGNOSIS — Z992 Dependence on renal dialysis: Secondary | ICD-10-CM | POA: Diagnosis not present

## 2015-01-06 DIAGNOSIS — I120 Hypertensive chronic kidney disease with stage 5 chronic kidney disease or end stage renal disease: Secondary | ICD-10-CM | POA: Diagnosis not present

## 2015-01-06 DIAGNOSIS — N186 End stage renal disease: Secondary | ICD-10-CM | POA: Diagnosis not present

## 2015-01-07 DIAGNOSIS — N2581 Secondary hyperparathyroidism of renal origin: Secondary | ICD-10-CM | POA: Diagnosis not present

## 2015-01-07 DIAGNOSIS — E119 Type 2 diabetes mellitus without complications: Secondary | ICD-10-CM | POA: Diagnosis not present

## 2015-01-07 DIAGNOSIS — N186 End stage renal disease: Secondary | ICD-10-CM | POA: Diagnosis not present

## 2015-01-07 DIAGNOSIS — D509 Iron deficiency anemia, unspecified: Secondary | ICD-10-CM | POA: Diagnosis not present

## 2015-01-07 DIAGNOSIS — D631 Anemia in chronic kidney disease: Secondary | ICD-10-CM | POA: Diagnosis not present

## 2015-01-10 DIAGNOSIS — D631 Anemia in chronic kidney disease: Secondary | ICD-10-CM | POA: Diagnosis not present

## 2015-01-10 DIAGNOSIS — E119 Type 2 diabetes mellitus without complications: Secondary | ICD-10-CM | POA: Diagnosis not present

## 2015-01-10 DIAGNOSIS — D509 Iron deficiency anemia, unspecified: Secondary | ICD-10-CM | POA: Diagnosis not present

## 2015-01-10 DIAGNOSIS — N2581 Secondary hyperparathyroidism of renal origin: Secondary | ICD-10-CM | POA: Diagnosis not present

## 2015-01-10 DIAGNOSIS — N186 End stage renal disease: Secondary | ICD-10-CM | POA: Diagnosis not present

## 2015-01-11 ENCOUNTER — Ambulatory Visit (INDEPENDENT_AMBULATORY_CARE_PROVIDER_SITE_OTHER)
Admission: RE | Admit: 2015-01-11 | Discharge: 2015-01-11 | Disposition: A | Discharge status: Home / Self Care | Payer: Medicare Other | Source: Ambulatory Visit | Attending: Vascular Surgery | Admitting: Vascular Surgery

## 2015-01-11 ENCOUNTER — Ambulatory Visit (HOSPITAL_COMMUNITY)
Admission: RE | Admit: 2015-01-11 | Discharge: 2015-01-11 | Disposition: A | Discharge status: Home / Self Care | Payer: Medicare Other | Source: Ambulatory Visit | Attending: Vascular Surgery | Admitting: Vascular Surgery

## 2015-01-11 ENCOUNTER — Other Ambulatory Visit: Payer: Self-pay

## 2015-01-11 ENCOUNTER — Encounter: Payer: Self-pay | Admitting: Vascular Surgery

## 2015-01-11 ENCOUNTER — Ambulatory Visit (INDEPENDENT_AMBULATORY_CARE_PROVIDER_SITE_OTHER): Payer: Medicare Other | Admitting: Vascular Surgery

## 2015-01-11 VITALS — BP 103/70 | HR 86 | Temp 97.9°F | Resp 16 | Ht 71.0 in | Wt 215.0 lb

## 2015-01-11 DIAGNOSIS — N186 End stage renal disease: Secondary | ICD-10-CM

## 2015-01-11 DIAGNOSIS — Z01810 Encounter for preprocedural cardiovascular examination: Secondary | ICD-10-CM | POA: Diagnosis not present

## 2015-01-11 NOTE — Progress Notes (Signed)
Subjective:     Patient ID: Shawn [name], male   DOB: 25-Dec-1960, 54 y.o.   MRN: [id number]  HPI this 54 year old male was evaluated for new vascular access. He was referred by Dr. Otelia Santee. Patient has had previous right upper arm AV graft which was inserted by Dr. Bridgett Larsson. Patient has had previous stents in his right axillary vein and recently had attempted thrombo-lysis right upper arm graft which was unsuccessful. He currently has dialysis catheter and right IJ. Patient had remote history of left radial-cephalic AV fistula created by Dr. Oneida Alar. He developed steal syndrome apparently 2-3 months later which required ligation of the fistula. He states that he is having no symptoms of pain or numbness in the left hand at the present time. He is adamant about not proceeding to thigh grafts at the present time because he does have diabetes and is concerned about his circulation.  Past Medical History  Diagnosis Date  . Hypertension   . Stroke   . Diabetes mellitus   . Arthritis   . No pertinent past medical history   . Blind right eye   . 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 Chronic kidney disease     Dialysis since 2011 MWF    History  Substance Use Topics  . Smoking status: Never Smoker   . Smokeless tobacco: Never Used  . Alcohol Use: No    Family History  Problem Relation Age of Onset  . Cancer Sister   . Stroke Sister   . Diabetes Mother   . Alzheimer's disease Mother   . Cancer Father   . Heart disease Father     No Known Allergies   Current outpatient prescriptions:  .  aspirin 325 MG tablet, Take 325 mg by mouth daily., Disp: , Rfl:  .  atorvastatin (LIPITOR) 20 MG tablet, Take 20 mg by mouth daily., Disp: , Rfl:  .  B Complex Vitamins (B COMPLEX-B12) TABS, Take 1 tablet by mouth daily., Disp: , Rfl:  .  cinacalcet (SENSIPAR) 30 MG tablet, Take 30 mg by mouth daily with supper., Disp: , Rfl:  .  colesevelam (WELCHOL) 625 MG  tablet, Take 1,250 mg by mouth daily with supper., Disp: , Rfl:  .  dorzolamide-timolol (COSOPT) 22.3-6.8 MG/ML ophthalmic solution, Place 1 drop into the left eye 2 (two) times daily., Disp: , Rfl:  .  folic acid-vitamin b complex-vitamin c-selenium-zinc (DIALYVITE) 3 MG TABS, Take 1 tablet by mouth daily., Disp: , Rfl:  .  insulin NPH-insulin regular (NOVOLIN 70/30) (70-30) 100 UNIT/ML injection, Inject 15 Units into the skin 2 (two) times daily with a meal., Disp: , Rfl:  .  lisinopril (PRINIVIL,ZESTRIL) 5 MG tablet, Take 5 mg by mouth as needed. , Disp: , Rfl:  .  oxyCODONE (ROXICODONE) 5 MG immediate release tablet, Take 1-2 tablets (5-10 mg total) by mouth every 4 (four) hours as needed for severe pain., Disp: 40 tablet, Rfl: 0 .  PRESCRIPTION MEDICATION, Apply to eye. Eye drop., Disp: , Rfl:   Filed Vitals:   01/11/15 1439  BP: 103/70  Pulse: 86  Temp: 97.9 F (36.6 C)  TempSrc: Oral  Resp: 16  Height: 5\' 11"  (1.803 m)  Weight: 215 lb (97.523 kg)  SpO2: 99%    Body mass index is 30 kg/(m^2).           Review of Systems denies chest pain, dyspnea on exertion, orthopnea, hemoptysis. Patient is blind in right eye. Has bilateral edema.  Objective:   Physical Exam BP 103/70 mmHg  Pulse 86  Temp(Src) 97.9 F (36.6 C) (Oral)  Resp 16  Ht 5\' 11"  (1.803 m)  Wt 215 lb (97.523 kg)  BMI 30.00 kg/m2  SpO2 99%  Gen.-alert and oriented x3 in no apparent distress HEENT essentially blind in both eyes Lungs no rhonchi or wheezing Cardiovascular regular rhythm no murmurs carotid pulses 3+ palpable no bruits audible Abdomen soft nontender no palpable masses Musculoskeletal free of  major deformities Skin clear -no rashes Neurologic normal Lower extremities 3+ femoral and 2+ popliteal pulses palpable bilaterally. No ischemic ulcers noted. Left upper extremity with 3+ brachial and 2+ radial pulse palpable. Right upper extremity with right upper arm graft with no palpable  pulse or thrill.  Today I ordered bilateral vein mapping and arterial study. There is triphasic flow in the radial and ulnar arteries at the left wrist as well as the brachial artery. The cephalic and basilic systems are quite small bilaterally. I performed a bedside sono site ultrasound exam and did not see any veins adequate for AV fistula creation        Assessment:     End-stage renal disease with nonsalvageable right upper extremity access History of steal left radial cephalic fistula many years ago requiring ligation 3 months after it was created Feel best plan would be one attempt at left upper arm graft and if this is unsuccessful then patient will need this ligated will need to proceed with thigh graft    Plan:     Tuesday, July 12. Patient understands that this may result in steal syndrome and if so require ligation and/or removal of graft and will then need to proceed to left thigh or right thigh graft. Patient understands this would like to proceed on a Tuesday or Thursday as soon as possible

## 2015-01-12 DIAGNOSIS — N2581 Secondary hyperparathyroidism of renal origin: Secondary | ICD-10-CM | POA: Diagnosis not present

## 2015-01-12 DIAGNOSIS — N186 End stage renal disease: Secondary | ICD-10-CM | POA: Diagnosis not present

## 2015-01-12 DIAGNOSIS — D631 Anemia in chronic kidney disease: Secondary | ICD-10-CM | POA: Diagnosis not present

## 2015-01-12 DIAGNOSIS — E119 Type 2 diabetes mellitus without complications: Secondary | ICD-10-CM | POA: Diagnosis not present

## 2015-01-12 DIAGNOSIS — D509 Iron deficiency anemia, unspecified: Secondary | ICD-10-CM | POA: Diagnosis not present

## 2015-01-13 ENCOUNTER — Encounter: Payer: Self-pay | Admitting: Nephrology

## 2015-01-14 DIAGNOSIS — D509 Iron deficiency anemia, unspecified: Secondary | ICD-10-CM | POA: Diagnosis not present

## 2015-01-14 DIAGNOSIS — N2581 Secondary hyperparathyroidism of renal origin: Secondary | ICD-10-CM | POA: Diagnosis not present

## 2015-01-14 DIAGNOSIS — N186 End stage renal disease: Secondary | ICD-10-CM | POA: Diagnosis not present

## 2015-01-14 DIAGNOSIS — E119 Type 2 diabetes mellitus without complications: Secondary | ICD-10-CM | POA: Diagnosis not present

## 2015-01-14 DIAGNOSIS — D631 Anemia in chronic kidney disease: Secondary | ICD-10-CM | POA: Diagnosis not present

## 2015-01-17 ENCOUNTER — Encounter (HOSPITAL_COMMUNITY): Payer: Self-pay | Admitting: *Deleted

## 2015-01-17 DIAGNOSIS — E119 Type 2 diabetes mellitus without complications: Secondary | ICD-10-CM | POA: Diagnosis not present

## 2015-01-17 DIAGNOSIS — N2581 Secondary hyperparathyroidism of renal origin: Secondary | ICD-10-CM | POA: Diagnosis not present

## 2015-01-17 DIAGNOSIS — N186 End stage renal disease: Secondary | ICD-10-CM | POA: Diagnosis not present

## 2015-01-17 DIAGNOSIS — D509 Iron deficiency anemia, unspecified: Secondary | ICD-10-CM | POA: Diagnosis not present

## 2015-01-17 DIAGNOSIS — D631 Anemia in chronic kidney disease: Secondary | ICD-10-CM | POA: Diagnosis not present

## 2015-01-17 MED ORDER — CEFUROXIME SODIUM 1.5 G IJ SOLR
1.5000 g | INTRAMUSCULAR | Status: AC
  Administered 2015-01-18: 1.5 g via INTRAVENOUS
  Filled 2015-01-17: qty 1.5

## 2015-01-18 ENCOUNTER — Encounter (HOSPITAL_COMMUNITY): Payer: Self-pay | Admitting: *Deleted

## 2015-01-18 ENCOUNTER — Ambulatory Visit (HOSPITAL_COMMUNITY): Payer: Medicare Other | Admitting: Certified Registered Nurse Anesthetist

## 2015-01-18 ENCOUNTER — Encounter (HOSPITAL_COMMUNITY)
Admission: RE | Disposition: A | Discharge status: Home / Self Care | Payer: Self-pay | Source: Ambulatory Visit | Attending: Vascular Surgery

## 2015-01-18 ENCOUNTER — Encounter (HOSPITAL_COMMUNITY): Payer: Self-pay | Admitting: Emergency Medicine

## 2015-01-18 ENCOUNTER — Ambulatory Visit (HOSPITAL_COMMUNITY)
Admission: RE | Admit: 2015-01-18 | Discharge: 2015-01-18 | Disposition: A | Discharge status: Home / Self Care | Payer: Medicare Other | Source: Ambulatory Visit | Attending: Vascular Surgery | Admitting: Vascular Surgery

## 2015-01-18 ENCOUNTER — Emergency Department (HOSPITAL_COMMUNITY)
Admission: EM | Admit: 2015-01-18 | Discharge: 2015-01-18 | Disposition: A | Discharge status: Home / Self Care | Payer: Medicare Other | Source: Home / Self Care | Attending: Emergency Medicine | Admitting: Emergency Medicine

## 2015-01-18 DIAGNOSIS — Z7982 Long term (current) use of aspirin: Secondary | ICD-10-CM

## 2015-01-18 DIAGNOSIS — R404 Transient alteration of awareness: Secondary | ICD-10-CM | POA: Diagnosis not present

## 2015-01-18 DIAGNOSIS — N186 End stage renal disease: Secondary | ICD-10-CM

## 2015-01-18 DIAGNOSIS — H5441 Blindness, right eye, normal vision left eye: Secondary | ICD-10-CM | POA: Diagnosis not present

## 2015-01-18 DIAGNOSIS — M1990 Unspecified osteoarthritis, unspecified site: Secondary | ICD-10-CM | POA: Diagnosis not present

## 2015-01-18 DIAGNOSIS — Z862 Personal history of diseases of the blood and blood-forming organs and certain disorders involving the immune mechanism: Secondary | ICD-10-CM | POA: Insufficient documentation

## 2015-01-18 DIAGNOSIS — Z794 Long term (current) use of insulin: Secondary | ICD-10-CM | POA: Insufficient documentation

## 2015-01-18 DIAGNOSIS — I120 Hypertensive chronic kidney disease with stage 5 chronic kidney disease or end stage renal disease: Secondary | ICD-10-CM | POA: Diagnosis not present

## 2015-01-18 DIAGNOSIS — I953 Hypotension of hemodialysis: Secondary | ICD-10-CM

## 2015-01-18 DIAGNOSIS — R531 Weakness: Secondary | ICD-10-CM

## 2015-01-18 DIAGNOSIS — Z992 Dependence on renal dialysis: Secondary | ICD-10-CM | POA: Insufficient documentation

## 2015-01-18 DIAGNOSIS — D649 Anemia, unspecified: Secondary | ICD-10-CM | POA: Diagnosis not present

## 2015-01-18 DIAGNOSIS — Z8673 Personal history of transient ischemic attack (TIA), and cerebral infarction without residual deficits: Secondary | ICD-10-CM

## 2015-01-18 DIAGNOSIS — H5411 Blindness, right eye, low vision left eye: Secondary | ICD-10-CM

## 2015-01-18 DIAGNOSIS — E1122 Type 2 diabetes mellitus with diabetic chronic kidney disease: Secondary | ICD-10-CM | POA: Insufficient documentation

## 2015-01-18 DIAGNOSIS — N185 Chronic kidney disease, stage 5: Secondary | ICD-10-CM | POA: Diagnosis not present

## 2015-01-18 DIAGNOSIS — E119 Type 2 diabetes mellitus without complications: Secondary | ICD-10-CM

## 2015-01-18 HISTORY — DX: Legal blindness, as defined in USA: H54.8

## 2015-01-18 HISTORY — PX: AV FISTULA PLACEMENT: SHX1204

## 2015-01-18 HISTORY — DX: Anemia, unspecified: D64.9

## 2015-01-18 LAB — LIPASE, BLOOD: LIPASE: 36 U/L (ref 22–51)

## 2015-01-18 LAB — POCT I-STAT 4, (NA,K, GLUC, HGB,HCT)
Glucose, Bld: 194 mg/dL — ABNORMAL HIGH (ref 65–99)
HCT: 42 % (ref 39.0–52.0)
HEMOGLOBIN: 14.3 g/dL (ref 13.0–17.0)
Potassium: 4.1 mmol/L (ref 3.5–5.1)
Sodium: 135 mmol/L (ref 135–145)

## 2015-01-18 LAB — CBC WITH DIFFERENTIAL/PLATELET
Basophils Absolute: 0 10*3/uL (ref 0.0–0.1)
Basophils Relative: 0 % (ref 0–1)
EOS PCT: 1 % (ref 0–5)
Eosinophils Absolute: 0.1 10*3/uL (ref 0.0–0.7)
HCT: 37.9 % — ABNORMAL LOW (ref 39.0–52.0)
Hemoglobin: 12.2 g/dL — ABNORMAL LOW (ref 13.0–17.0)
Lymphocytes Relative: 7 % — ABNORMAL LOW (ref 12–46)
Lymphs Abs: 0.7 10*3/uL (ref 0.7–4.0)
MCH: 29 pg (ref 26.0–34.0)
MCHC: 32.2 g/dL (ref 30.0–36.0)
MCV: 90.2 fL (ref 78.0–100.0)
MONOS PCT: 6 % (ref 3–12)
Monocytes Absolute: 0.6 10*3/uL (ref 0.1–1.0)
Neutro Abs: 8.8 10*3/uL — ABNORMAL HIGH (ref 1.7–7.7)
Neutrophils Relative %: 86 % — ABNORMAL HIGH (ref 43–77)
Platelets: 228 10*3/uL (ref 150–400)
RBC: 4.2 MIL/uL — ABNORMAL LOW (ref 4.22–5.81)
RDW: 14.7 % (ref 11.5–15.5)
WBC: 10.2 10*3/uL (ref 4.0–10.5)

## 2015-01-18 LAB — GLUCOSE, CAPILLARY
Glucose-Capillary: 189 mg/dL — ABNORMAL HIGH (ref 65–99)
Glucose-Capillary: 179 mg/dL — ABNORMAL HIGH (ref 65–99)
Glucose-Capillary: 174 mg/dL — ABNORMAL HIGH (ref 65–99)

## 2015-01-18 LAB — COMPREHENSIVE METABOLIC PANEL
ALK PHOS: 113 U/L (ref 38–126)
ALT: 14 U/L — ABNORMAL LOW (ref 17–63)
AST: 18 U/L (ref 15–41)
Albumin: 3.6 g/dL (ref 3.5–5.0)
Anion gap: 15 (ref 5–15)
BILIRUBIN TOTAL: 0.7 mg/dL (ref 0.3–1.2)
BUN: 38 mg/dL — AB (ref 6–20)
CHLORIDE: 94 mmol/L — AB (ref 101–111)
CO2: 25 mmol/L (ref 22–32)
Calcium: 8.1 mg/dL — ABNORMAL LOW (ref 8.9–10.3)
Creatinine, Ser: 9.01 mg/dL — ABNORMAL HIGH (ref 0.61–1.24)
GFR calc Af Amer: 7 mL/min — ABNORMAL LOW (ref >60)
GFR, EST NON AFRICAN AMERICAN: 6 mL/min — AB (ref >60)
GLUCOSE: 241 mg/dL — AB (ref 65–99)
POTASSIUM: 4.6 mmol/L (ref 3.5–5.1)
SODIUM: 134 mmol/L — AB (ref 135–145)
Total Protein: 7.5 g/dL (ref 6.5–8.1)

## 2015-01-18 LAB — I-STAT CG4 LACTIC ACID, ED: LACTIC ACID, VENOUS: 2.46 mmol/L — AB (ref 0.5–2.0)

## 2015-01-18 SURGERY — INSERTION OF ARTERIOVENOUS (AV) GORE-TEX GRAFT ARM
Anesthesia: General | Site: Arm Upper | Laterality: Left

## 2015-01-18 MED ORDER — ONDANSETRON 8 MG PO TBDP
8.0000 mg | ORAL_TABLET | Freq: Once | ORAL | Status: AC
  Administered 2015-01-18: 8 mg via ORAL
  Filled 2015-01-18: qty 1

## 2015-01-18 MED ORDER — EPHEDRINE SULFATE 50 MG/ML IJ SOLN
INTRAMUSCULAR | Status: DC | PRN
Start: 2015-01-18 — End: 2015-01-18
  Administered 2015-01-18 (×3): 10 mg via INTRAVENOUS

## 2015-01-18 MED ORDER — GLYCOPYRROLATE 0.2 MG/ML IJ SOLN
INTRAMUSCULAR | Status: DC | PRN
Start: 2015-01-18 — End: 2015-01-18
  Administered 2015-01-18: 0.6 mg via INTRAVENOUS

## 2015-01-18 MED ORDER — ONDANSETRON 8 MG PO TBDP: 8.0000 mg | ORAL_TABLET | Freq: Three times a day (TID) | ORAL | Status: DC | PRN

## 2015-01-18 MED ORDER — PHENYLEPHRINE HCL 10 MG/ML IJ SOLN
10.0000 mg | INTRAVENOUS | Status: DC | PRN
  Administered 2015-01-18: 30 ug/min via INTRAVENOUS

## 2015-01-18 MED ORDER — PROMETHAZINE HCL 25 MG/ML IJ SOLN
6.2500 mg | INTRAMUSCULAR | Status: DC | PRN
Start: 2015-01-18 — End: 2015-01-18

## 2015-01-18 MED ORDER — HEPARIN SODIUM (PORCINE) 5000 UNIT/ML IJ SOLN
INTRAMUSCULAR | Status: DC | PRN
  Administered 2015-01-18: 08:00:00

## 2015-01-18 MED ORDER — FENTANYL CITRATE (PF) 250 MCG/5ML IJ SOLN
INTRAMUSCULAR | Status: AC
  Filled 2015-01-18: qty 5

## 2015-01-18 MED ORDER — FENTANYL CITRATE (PF) 100 MCG/2ML IJ SOLN: 25.0000 ug | INTRAMUSCULAR | Status: DC | PRN

## 2015-01-18 MED ORDER — CHLORHEXIDINE GLUCONATE CLOTH 2 % EX PADS: 6.0000 | MEDICATED_PAD | Freq: Once | CUTANEOUS | Status: DC

## 2015-01-18 MED ORDER — THROMBIN 20000 UNITS EX SOLR
CUTANEOUS | Status: DC | PRN
  Administered 2015-01-18: 09:00:00 via TOPICAL

## 2015-01-18 MED ORDER — THROMBIN 20000 UNITS EX SOLR
CUTANEOUS | Status: AC
  Filled 2015-01-18: qty 20000

## 2015-01-18 MED ORDER — FENTANYL CITRATE (PF) 100 MCG/2ML IJ SOLN
INTRAMUSCULAR | Status: DC | PRN
  Administered 2015-01-18 (×2): 50 ug via INTRAVENOUS

## 2015-01-18 MED ORDER — PROPOFOL 10 MG/ML IV BOLUS
INTRAVENOUS | Status: DC | PRN
  Administered 2015-01-18: 150 mg via INTRAVENOUS
  Administered 2015-01-18: 50 mg via INTRAVENOUS
  Administered 2015-01-18: 40 mg via INTRAVENOUS

## 2015-01-18 MED ORDER — MIDAZOLAM HCL 2 MG/2ML IJ SOLN: 0.5000 mg | Freq: Once | INTRAMUSCULAR | Status: DC | PRN

## 2015-01-18 MED ORDER — NEOSTIGMINE METHYLSULFATE 10 MG/10ML IV SOLN
INTRAVENOUS | Status: DC | PRN
  Administered 2015-01-18: 5 mg via INTRAVENOUS

## 2015-01-18 MED ORDER — SODIUM CHLORIDE 0.9 % IV SOLN
INTRAVENOUS | Status: DC
Start: 2015-01-18 — End: 2015-01-18
  Administered 2015-01-18 (×2): via INTRAVENOUS

## 2015-01-18 MED ORDER — SODIUM CHLORIDE 0.9 % IV BOLUS (SEPSIS)
500.0000 mL | Freq: Once | INTRAVENOUS | Status: AC
  Administered 2015-01-18: 500 mL via INTRAVENOUS

## 2015-01-18 MED ORDER — MEPERIDINE HCL 25 MG/ML IJ SOLN: 6.2500 mg | INTRAMUSCULAR | Status: DC | PRN

## 2015-01-18 MED ORDER — OXYCODONE-ACETAMINOPHEN 5-325 MG PO TABS: 1.0000 | ORAL_TABLET | Freq: Four times a day (QID) | ORAL | Status: DC | PRN

## 2015-01-18 MED ORDER — PROPOFOL 10 MG/ML IV BOLUS
INTRAVENOUS | Status: AC
  Filled 2015-01-18: qty 20

## 2015-01-18 MED ORDER — HEPARIN SODIUM (PORCINE) 1000 UNIT/ML IJ SOLN
INTRAMUSCULAR | Status: DC | PRN
  Administered 2015-01-18: 5000 [IU] via INTRAVENOUS

## 2015-01-18 MED ORDER — 0.9 % SODIUM CHLORIDE (POUR BTL) OPTIME
TOPICAL | Status: DC | PRN
  Administered 2015-01-18: 1000 mL

## 2015-01-18 MED ORDER — CISATRACURIUM BESYLATE (PF) 10 MG/5ML IV SOLN
INTRAVENOUS | Status: DC | PRN
  Administered 2015-01-18: 18 mg via INTRAVENOUS

## 2015-01-18 MED ORDER — ALBUMIN HUMAN 5 % IV SOLN
INTRAVENOUS | Status: DC | PRN
  Administered 2015-01-18: 08:00:00 via INTRAVENOUS

## 2015-01-18 MED ORDER — PROTAMINE SULFATE 10 MG/ML IV SOLN
INTRAVENOUS | Status: DC | PRN
  Administered 2015-01-18: 30 mg via INTRAVENOUS

## 2015-01-18 SURGICAL SUPPLY — 42 items
ARMBAND PINK RESTRICT EXTREMIT (MISCELLANEOUS) ×3 IMPLANT
CANISTER SUCTION 2500CC (MISCELLANEOUS) ×3 IMPLANT
CLIP TI MEDIUM 6 (CLIP) ×3 IMPLANT
CLIP TI WIDE RED SMALL 6 (CLIP) ×3 IMPLANT
COVER PROBE W GEL 5X96 (DRAPES) ×3 IMPLANT
DECANTER SPIKE VIAL GLASS SM (MISCELLANEOUS) ×1 IMPLANT
ELECT REM PT RETURN 9FT ADLT (ELECTROSURGICAL) ×3
ELECTRODE REM PT RTRN 9FT ADLT (ELECTROSURGICAL) ×1 IMPLANT
GLOVE BIO SURGEON STRL SZ 6.5 (GLOVE) ×1 IMPLANT
GLOVE BIO SURGEON STRL SZ7 (GLOVE) ×3 IMPLANT
GLOVE BIO SURGEON STRL SZ7.5 (GLOVE) ×2 IMPLANT
GLOVE BIO SURGEONS STRL SZ 6.5 (GLOVE) ×1
GLOVE BIOGEL PI IND STRL 6.5 (GLOVE) IMPLANT
GLOVE BIOGEL PI IND STRL 7.0 (GLOVE) IMPLANT
GLOVE BIOGEL PI IND STRL 7.5 (GLOVE) ×1 IMPLANT
GLOVE BIOGEL PI IND STRL 8 (GLOVE) IMPLANT
GLOVE BIOGEL PI INDICATOR 6.5 (GLOVE) ×2
GLOVE BIOGEL PI INDICATOR 7.0 (GLOVE) ×2
GLOVE BIOGEL PI INDICATOR 7.5 (GLOVE) ×2
GLOVE BIOGEL PI INDICATOR 8 (GLOVE) ×4
GLOVE OPTIFIT SS 6.5 STRL BRWN (GLOVE) ×2 IMPLANT
GLOVE SS BIOGEL STRL SZ 6.5 (GLOVE) IMPLANT
GLOVE SUPERSENSE BIOGEL SZ 6.5 (GLOVE) ×4
GOWN STRL REUS W/ TWL LRG LVL3 (GOWN DISPOSABLE) ×3 IMPLANT
GOWN STRL REUS W/TWL LRG LVL3 (GOWN DISPOSABLE) ×9
GRAFT GORETEX STRT 4-7X45 (Vascular Products) ×2 IMPLANT
KIT BASIN OR (CUSTOM PROCEDURE TRAY) ×3 IMPLANT
KIT ROOM TURNOVER OR (KITS) ×3 IMPLANT
LIQUID BAND (GAUZE/BANDAGES/DRESSINGS) ×3 IMPLANT
NS IRRIG 1000ML POUR BTL (IV SOLUTION) ×3 IMPLANT
PACK CV ACCESS (CUSTOM PROCEDURE TRAY) ×3 IMPLANT
PAD ARMBOARD 7.5X6 YLW CONV (MISCELLANEOUS) ×6 IMPLANT
SPONGE SURGIFOAM ABS GEL 100 (HEMOSTASIS) IMPLANT
SUT MNCRL AB 4-0 PS2 18 (SUTURE) ×3 IMPLANT
SUT PROLENE 5 0 C 1 24 (SUTURE) IMPLANT
SUT PROLENE 6 0 BV (SUTURE) ×6 IMPLANT
SUT PROLENE 7 0 BV 1 (SUTURE) ×2 IMPLANT
SUT SILK 2 0 FS (SUTURE) ×3 IMPLANT
SUT VIC AB 3-0 SH 27 (SUTURE) ×6
SUT VIC AB 3-0 SH 27X BRD (SUTURE) ×2 IMPLANT
UNDERPAD 30X30 INCONTINENT (UNDERPADS AND DIAPERS) ×3 IMPLANT
WATER STERILE IRR 1000ML POUR (IV SOLUTION) ×3 IMPLANT

## 2015-01-18 NOTE — Op Note (Signed)
OPERATIVE NOTE   PROCEDURE:  left upper arm arteriovenous graft  PRE-OPERATIVE DIAGNOSIS: end stage renal disease   POST-OPERATIVE DIAGNOSIS: same as above   SURGEON: Adele Barthel, MD  ASSISTANT(S): Silva Bandy, PAC   ANESTHESIA: general  ESTIMATED BLOOD LOSS: 100 cc  FINDING(S): 1. Weakly palpable thrill in graft with some pulsatile character 2. Minimal augmentation of left radial signal with venous outflow compression  SPECIMEN(S):  none  INDICATIONS:   Shawn [name] is a 54 y.o. male who presents with end stage renal disease and history of failing right arm graft with stented central venous system.  Dr. Kellie Simmering recommended proceeding with a thigh AVG but he refused.  Though the pt has a prior history of steal in left arm, it was felt this was his last chance of upper extremity permanent access.  Dr. Kellie Simmering recommended left upper arm arteriovenous graft placement.  Risk, benefits, and alternatives to access surgery were discussed.  The patient is aware the risks include but are not limited to: bleeding, infection, steal syndrome, nerve damage, ischemic monomelic neuropathy, failure to mature, and need for additional procedures.  The patient is aware of increased risk of steal in his left arm.  The patient is aware of the risks and elects to proceed forward.  DESCRIPTION: After full informed written consent was obtained from the patient, the patient was brought back to the operating room and placed supine upon the operating table.  The patient was given IV antibiotics prior to proceeding.  After obtaining adequate sedation, the patient was prepped and draped in standard fashion for a left arm access procedure.  I turned my attention first to the antecubitum.  Under ultrasound guidance, I identified the location of the brachial artery and marked it on the skin.  I then examined the bicipital groove and identified the high brachial vein and marked it on the skin.  I made an incision  over the brachial artery, and dissected down through the subcutaneous tissue to the fascia carefully and was able to dissect out the brachial artery.  The artery was about 3 mm externally.  It was controlled proximally and distally with vessel loops and then I turned my attention to the high bicipital groove.  I made an incision at the previously anesthetized site, dissected down through the subcutaneous tissue and fascia until I reached the high brachial vein.  Externally, it appeared to be 5 mm in diameter.  I then dissected this vein proximally and distal.  I took a metal Gore tunneler and dissected from the antecubital incision up to the high bicipital incision.  Then I delivered the 4 x 7-mm stretch Gore-Tex graft, through this metal tunneler and then pulled out the metal tunneler leaving the graft in place.  The 4-mm end was left on the antecubital side and the 7 mm toward the axillary.  I then gave the patient 3000 units of heparin to gain some anticoagulation.  After waiting 3 minutes, I placed the brachial artery under tension proximally and distally with vessel loops, made an arteriotomy and extended it with a Potts scissor.  I sewed the 4-mm end of the graft to this arteriotomy with a running stitch of 6-0 Prolene.  At this point, then I completed the anastomosis in the usual fashion.  I released the vessel loops on the inflow and allowed the artery to decompress through the graft. There was good pulsatile bleeding through this graft.  I clamped the graft near its arterial anastomosis and  sucked out all the blood in the graft and loaded the graft with heparinized saline.  At this point, I pulled the graft to appropriate length and reset my exposure of the high brachial vein.  I tied off the high brachial vein distally with a 2-0 silk and then transected it.  There was good venous backbleeding from the vein.  Then, I injected some heparinized saline into this vein and then clamped it.  I then spatulated  the vein to facilitate an end-to- end anastomosis.  I also spatulated the graft to facilitate an end-to-end anastomosis.  In the process of spatulating, I cut the graft to appropriate length for this anastomosis.  This graft was sewn to the vein in an end-to-end configuration with a 6-0 Prolene.  Prior to completing this anastomosis, I allowed the vein to back bleed and then I also allowed the artery to bleed in an antegrade fashion.  I completed this anastomosis in the usual fashion, and then irrigated out the high bicipital exposure and then placed thrombin and Gelfoam.  I then turned my attention back to the antecubitum.  The distal radial signal was dopplerable without augmentation with venous outflow control.  Using a continuous Doppler, the brachial artery proximally and distally had multiphasic waveforms.  The venous outflow had a flow signature consistent with widely patent arterial venous graft.  At this point, I washed out the antecubital incision.  There was no more active bleeding.  The subcutaneous tissue was reapproximated with a running stitch of 3-0 Vicryl.  The skin was then reapproximated with a running subcuticular 4-0 Monocryl.  The skin was then cleaned, dried, and Dermabond used to reinforce the skin closure.  We then turned our attention to the high bicipital exposure.  I removed all the thrombin and gelfoam and washed out the wound.  There was no more active bleeding.  The subcutaneous tissue was repaired with running stitch of 3-0 Vicryl.  The skin was then reapproximated with running subcuticular 4-0 Monocryl.  The skin was then cleaned, dried, and then the skin closure was reinforced with Dermabond.    COMPLICATIONS: none  CONDITION: stable   Adele Barthel, MD [facility] [place] Office: [phone] Pager: [phone]  01/18/2015, 9:49 AM

## 2015-01-18 NOTE — Anesthesia Procedure Notes (Addendum)
Procedure Name: LMA Insertion Date/Time: 01/18/2015 7:39 AM Performed by: Shirlyn Goltz Pre-anesthesia Checklist: Patient identified, Emergency Drugs available, Suction available and Patient being monitored Patient Re-evaluated:Patient Re-evaluated prior to inductionOxygen Delivery Method: Circle system utilized Preoxygenation: Pre-oxygenation with 100% oxygen Intubation Type: IV induction Ventilation: Oral airway inserted - appropriate to patient size LMA: LMA inserted LMA Size: 4.0 Number of attempts: 2 Placement Confirmation: positive ETCO2 and breath sounds checked- equal and bilateral Tube secured with: Tape Dental Injury: Teeth and Oropharynx as per pre-operative assessment    Procedure Name: Intubation Date/Time: 01/18/2015 8:25 AM Performed by: Shirlyn Goltz Patient Re-evaluated:Patient Re-evaluated prior to inductionOxygen Delivery Method: Circle system utilized Preoxygenation: Pre-oxygenation with 100% oxygen Laryngoscope Size: Mac and 4 Grade View: Grade II Tube type: Oral Tube size: 7.0 mm Number of attempts: 1 Airway Equipment and Method: Stylet Placement Confirmation: ETT inserted through vocal cords under direct vision,  positive ETCO2 and breath sounds checked- equal and bilateral Secured at: 21 cm Tube secured with: Tape Dental Injury: Teeth and Oropharynx as per pre-operative assessment

## 2015-01-18 NOTE — Progress Notes (Signed)
R. Chest Hemodialysis Cath (Arterial) deaccessed. Flushed with NS with good blood return noted. 1.9 units heparin (1000 usp/ml) instilled for discharge.   IV Team

## 2015-01-18 NOTE — ED Notes (Signed)
Pt is getting dressed.

## 2015-01-18 NOTE — Progress Notes (Signed)
Hemodialysis cath connected. IVF infusing upon arrival to pacu at [facility]

## 2015-01-18 NOTE — Discharge Instructions (Signed)
As discussed, your evaluation today has been largely reassuring.  But, it is important that you monitor your condition carefully, and do not hesitate to return to the ED if you develop new, or concerning changes in your condition. ? ?Otherwise, please follow-up with your physician for appropriate ongoing care. ? ?

## 2015-01-18 NOTE — H&P (View-Only) (Signed)
Subjective:     Patient ID: Shawn [name], male   DOB: Dec 01, 1960, 55 y.o.   MRN: [id number]  HPI this 54 year old male was evaluated for new vascular access. He was referred by Dr. Otelia Santee. Patient has had previous right upper arm AV graft which was inserted by Dr. Bridgett Larsson. Patient has had previous stents in his right axillary vein and recently had attempted thrombo-lysis right upper arm graft which was unsuccessful. He currently has dialysis catheter and right IJ. Patient had remote history of left radial-cephalic AV fistula created by Dr. Oneida Alar. He developed steal syndrome apparently 2-3 months later which required ligation of the fistula. He states that he is having no symptoms of pain or numbness in the left hand at the present time. He is adamant about not proceeding to thigh grafts at the present time because he does have diabetes and is concerned about his circulation.  Past Medical History  Diagnosis Date  . Hypertension   . Stroke   . Diabetes mellitus   . Arthritis   . No pertinent past medical history   . Blind right eye   . 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 Chronic kidney disease     Dialysis since 2011 MWF    History  Substance Use Topics  . Smoking status: Never Smoker   . Smokeless tobacco: Never Used  . Alcohol Use: No    Family History  Problem Relation Age of Onset  . Cancer Sister   . Stroke Sister   . Diabetes Mother   . Alzheimer's disease Mother   . Cancer Father   . Heart disease Father     No Known Allergies   Current outpatient prescriptions:  .  aspirin 325 MG tablet, Take 325 mg by mouth daily., Disp: , Rfl:  .  atorvastatin (LIPITOR) 20 MG tablet, Take 20 mg by mouth daily., Disp: , Rfl:  .  B Complex Vitamins (B COMPLEX-B12) TABS, Take 1 tablet by mouth daily., Disp: , Rfl:  .  cinacalcet (SENSIPAR) 30 MG tablet, Take 30 mg by mouth daily with supper., Disp: , Rfl:  .  colesevelam (WELCHOL) 625 MG  tablet, Take 1,250 mg by mouth daily with supper., Disp: , Rfl:  .  dorzolamide-timolol (COSOPT) 22.3-6.8 MG/ML ophthalmic solution, Place 1 drop into the left eye 2 (two) times daily., Disp: , Rfl:  .  folic acid-vitamin b complex-vitamin c-selenium-zinc (DIALYVITE) 3 MG TABS, Take 1 tablet by mouth daily., Disp: , Rfl:  .  insulin NPH-insulin regular (NOVOLIN 70/30) (70-30) 100 UNIT/ML injection, Inject 15 Units into the skin 2 (two) times daily with a meal., Disp: , Rfl:  .  lisinopril (PRINIVIL,ZESTRIL) 5 MG tablet, Take 5 mg by mouth as needed. , Disp: , Rfl:  .  oxyCODONE (ROXICODONE) 5 MG immediate release tablet, Take 1-2 tablets (5-10 mg total) by mouth every 4 (four) hours as needed for severe pain., Disp: 40 tablet, Rfl: 0 .  PRESCRIPTION MEDICATION, Apply to eye. Eye drop., Disp: , Rfl:   Filed Vitals:   01/11/15 1439  BP: 103/70  Pulse: 86  Temp: 97.9 F (36.6 C)  TempSrc: Oral  Resp: 16  Height: 5\' 11"  (1.803 m)  Weight: 215 lb (97.523 kg)  SpO2: 99%    Body mass index is 30 kg/(m^2).           Review of Systems denies chest pain, dyspnea on exertion, orthopnea, hemoptysis. Patient is blind in right eye. Has bilateral edema.  Objective:   Physical Exam BP 103/70 mmHg  Pulse 86  Temp(Src) 97.9 F (36.6 C) (Oral)  Resp 16  Ht 5\' 11"  (1.803 m)  Wt 215 lb (97.523 kg)  BMI 30.00 kg/m2  SpO2 99%  Gen.-alert and oriented x3 in no apparent distress HEENT essentially blind in both eyes Lungs no rhonchi or wheezing Cardiovascular regular rhythm no murmurs carotid pulses 3+ palpable no bruits audible Abdomen soft nontender no palpable masses Musculoskeletal free of  major deformities Skin clear -no rashes Neurologic normal Lower extremities 3+ femoral and 2+ popliteal pulses palpable bilaterally. No ischemic ulcers noted. Left upper extremity with 3+ brachial and 2+ radial pulse palpable. Right upper extremity with right upper arm graft with no palpable  pulse or thrill.  Today I ordered bilateral vein mapping and arterial study. There is triphasic flow in the radial and ulnar arteries at the left wrist as well as the brachial artery. The cephalic and basilic systems are quite small bilaterally. I performed a bedside sono site ultrasound exam and did not see any veins adequate for AV fistula creation        Assessment:     End-stage renal disease with nonsalvageable right upper extremity access History of steal left radial cephalic fistula many years ago requiring ligation 3 months after it was created Feel best plan would be one attempt at left upper arm graft and if this is unsuccessful then patient will need this ligated will need to proceed with thigh graft    Plan:     Tuesday, July 12. Patient understands that this may result in steal syndrome and if so require ligation and/or removal of graft and will then need to proceed to left thigh or right thigh graft. Patient understands this would like to proceed on a Tuesday or Thursday as soon as possible

## 2015-01-18 NOTE — Discharge Instructions (Signed)
° ° °  01/18/2015 Haidyn Worku [id number] [birth year]  Surgeon(s): Conrad Mount Lena, MD  Procedure(s): INSERTION OF ARTERIOVENOUS GORE-TEX GRAFT LEFT UPPER ARM  x Do not stick graft for 4 weeks

## 2015-01-18 NOTE — Anesthesia Postprocedure Evaluation (Signed)
  Anesthesia Post-op Note  Patient: Shawn [name]  Procedure(s) Performed: Procedure(s): INSERTION OF ARTERIOVENOUS GORE-TEX GRAFT LEFT UPPER ARM (Left)  Patient Location: PACU  Anesthesia Type:General  Level of Consciousness: awake, alert , oriented and patient cooperative  Airway and Oxygen Therapy: Patient Spontanous Breathing  Post-op Pain: none  Post-op Assessment: Post-op Vital signs reviewed, Patient's Cardiovascular Status Stable, Respiratory Function Stable, Patent Airway, No signs of Nausea or vomiting and Pain level controlled              Post-op Vital Signs: Reviewed and stable  Last Vitals:  Filed Vitals:   01/18/15 1100  BP:   Pulse:   Temp:   Resp: 12    Complications: No apparent anesthesia complications

## 2015-01-18 NOTE — Interval H&P Note (Signed)
[facility]  History and Physical Update  The patient was interviewed and re-examined.  The patient's previous History and Physical has been reviewed and is unchanged from Dr. Evelena Leyden consult.  There is no change in the plan of care: L UA AVG placement.  The patient is aware he is at higher risk of steal given his prior steal sx in his left arm.  The patient refuses thigh AVG placement so this is his last remaining permanent access option.  Risk, benefits, and alternatives to access surgery were discussed.  The patient is aware the risks include but are not limited to: bleeding, infection, steal syndrome, nerve damage, ischemic monomelic neuropathy, failure to mature, need for additional procedures, death and stroke.  The patient agrees to proceed forward with the procedure.   Adele Barthel, MD [facility] [place] Office: [phone] Pager: [phone]  01/18/2015, 7:11 AM

## 2015-01-18 NOTE — ED Notes (Signed)
Pt arrives via [facility] for c/o fatigue and nausea after leaving recovery at 1400 this afternoon after dialysis graft placed in left arm. Pt alert, oriented, nad.

## 2015-01-18 NOTE — Progress Notes (Signed)
Pt states 100% better. Able to drink 200 cc ginger ale and after repositioning stated cramping in legs gone. Able to stand and get dressed without nausea or dizziness. BP 102/65

## 2015-01-18 NOTE — Progress Notes (Signed)
Hemodialysis cath capped by IV team

## 2015-01-18 NOTE — Progress Notes (Signed)
Pt states feels" bad" . Nausea, cramping in legs, Dr. Glennon Mac made aware. Sipping on ginger ale slowly. Will monitor

## 2015-01-18 NOTE — Anesthesia Preprocedure Evaluation (Addendum)
Anesthesia Evaluation  Patient identified by MRN, date of birth, ID band Patient awake    Reviewed: Allergy & Precautions, NPO status , Patient's Chart, lab work & pertinent test results  History of Anesthesia Complications Negative for: history of anesthetic complications  Airway Mallampati: II  TM Distance: >3 FB Neck ROM: Full    Dental  (+) Poor Dentition, Chipped, Missing, Dental Advisory Given   Pulmonary neg pulmonary ROS,  breath sounds clear to auscultation        Cardiovascular hypertension, - angina+ Peripheral Vascular Disease Rhythm:Regular Rate:Normal     Neuro/Psych CVA (R sided weakness: walks with cane), Residual Symptoms    GI/Hepatic Neg liver ROS, GERD-  Controlled,  Endo/Other  diabetes (glu 194), Insulin Dependent  Renal/GU Dialysis and ESRFRenal disease (K+ 4.1, dialyzed yesterday)     Musculoskeletal   Abdominal (+) + obese,   Peds  Hematology negative hematology ROS (+)   Anesthesia Other Findings   Reproductive/Obstetrics                            Anesthesia Physical Anesthesia Plan  ASA: III  Anesthesia Plan: General   Post-op Pain Management:    Induction: Intravenous  Airway Management Planned: LMA  Additional Equipment:   Intra-op Plan:   Post-operative Plan:   Informed Consent: I have reviewed the patients History and Physical, chart, labs and discussed the procedure including the risks, benefits and alternatives for the proposed anesthesia with the patient or authorized representative who has indicated his/her understanding and acceptance.   Dental advisory given  Plan Discussed with: CRNA and Surgeon  Anesthesia Plan Comments: (Plan routine monitors, GA- LMA OK)        Anesthesia Quick Evaluation

## 2015-01-18 NOTE — Transfer of Care (Signed)
Immediate Anesthesia Transfer of Care Note  Patient: Shawn [name]  Procedure(s) Performed: Procedure(s): INSERTION OF ARTERIOVENOUS GORE-TEX GRAFT LEFT UPPER ARM (Left)  Patient Location: PACU  Anesthesia Type:General  Level of Consciousness: awake, alert , oriented and patient cooperative  Airway & Oxygen Therapy: Patient Spontanous Breathing and Patient connected to nasal cannula oxygen  Post-op Assessment: Report given to RN, Post -op Vital signs reviewed and stable, Patient moving all extremities and Patient moving all extremities X 4  Post vital signs: Reviewed and stable  Last Vitals:  Filed Vitals:   01/18/15 0654  BP: 105/61  Pulse:   Temp:   Resp:     Complications: No apparent anesthesia complications

## 2015-01-18 NOTE — ED Provider Notes (Signed)
CSN: [id number]     Arrival date & time 01/18/15  1736 History   First MD Initiated Contact with Patient 01/18/15 1745     Chief Complaint  Patient presents with  . Fatigue     (Consider location/radiation/quality/duration/timing/severity/associated sxs/prior Treatment) HPI Patient presents with concern of weakness, muscle spasms. Patient had planned placement of left dialysis graft. He was in his usual state of health prior to the procedure, including full session of dialysis yesterday. Since the procedure today, symptoms of been persistent, with no relief from anything. Patient also claims of nausea, denies vomiting, diarrhea, incontinence.    Past Medical History  Diagnosis Date  . Hypertension   . Stroke   . Diabetes mellitus   . Arthritis   . No pertinent past medical history   . Blind right eye   . 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 Chronic kidney disease     Dialysis since 2011 MWF  . Legally blind   . Anemia    Past Surgical History  Procedure Laterality Date  . Left arm graft  10/2010  . Insertion of dialysis catheter Right   . Eye surgery      retina surgery both eyes, cataract surgery both eyes  . Colonoscopy  06/2012  . Av fistula placement Right 05/12/2013    Procedure: INSERTION OF ARTERIOVENOUS (AV) GORE-TEX GRAFT ARM; ULTRASOUND GUIDED;  Surgeon: Conrad Cosby, MD;  Location: [facility] OR;  Service: Vascular;  Laterality: Right;  . Hernia repair      right inguinal   Family History  Problem Relation Age of Onset  . Cancer Sister   . Stroke Sister   . Diabetes Mother   . Alzheimer's disease Mother   . Cancer Father   . Heart disease Father    History  Substance Use Topics  . Smoking status: Never Smoker   . Smokeless tobacco: Never Used  . Alcohol Use: No    Review of Systems  Constitutional:       Per HPI, otherwise negative  HENT:       Per HPI, otherwise negative  Respiratory:       Per HPI, otherwise  negative  Cardiovascular:       Per HPI, otherwise negative  Gastrointestinal: Positive for nausea. Negative for vomiting.  Endocrine:       Negative aside from HPI  Genitourinary:       Neg aside from HPI   Musculoskeletal:       Per HPI, otherwise negative  Skin: Negative.   Neurological: Negative for syncope.      Allergies  Review of patient's allergies indicates no known allergies.  Home Medications   Prior to Admission medications   Medication Sig Start Date End Date Taking? Authorizing Provider  aspirin 325 MG tablet Take 325 mg by mouth daily.    Historical Provider, MD  atorvastatin (LIPITOR) 20 MG tablet Take 20 mg by mouth daily.    Historical Provider, MD  B Complex Vitamins (B COMPLEX-B12) TABS Take 1 tablet by mouth daily.    Historical Provider, MD  cinacalcet (SENSIPAR) 30 MG tablet Take 30 mg by mouth daily with supper.    Historical Provider, MD  colesevelam (WELCHOL) 625 MG tablet Take 1,250 mg by mouth daily with supper.    Historical Provider, MD  dorzolamide-timolol (COSOPT) 22.3-6.8 MG/ML ophthalmic solution Place 1 drop into the left eye 2 (two) times daily.    Historical Provider, MD  insulin NPH-insulin regular (  NOVOLIN 70/30) (70-30) 100 UNIT/ML injection Inject 15 Units into the skin 2 (two) times daily with a meal.    Historical Provider, MD  ondansetron (ZOFRAN ODT) 8 MG disintegrating tablet Take 1 tablet (8 mg total) by mouth every 8 (eight) hours as needed for nausea or vomiting. 01/18/15   Alvia Grove, PA-C  oxyCODONE-acetaminophen (ROXICET) 5-325 MG per tablet Take 1 tablet by mouth every 6 (six) hours as needed for severe pain. 01/18/15   Joelene Millin A Trinh, PA-C   BP 92/46 mmHg  Pulse 93  Temp(Src) 98.1 F (36.7 C) (Oral)  Resp 24  SpO2 100% Physical Exam  Constitutional: He is oriented to person, place, and time. He appears well-developed and well-nourished. He appears distressed.  Uncomfortable appearing male, diaphoretic  HENT:   Head: Normocephalic and atraumatic.  Eyes: Conjunctivae and EOM are normal.  Cardiovascular: Normal rate and regular rhythm.   Pulmonary/Chest: Effort normal. No stridor. No respiratory distress.  Abdominal: He exhibits no distension.  Musculoskeletal: He exhibits no edema.  Visible muscle spasms in both legs proximally  Neurological: He is alert and oriented to person, place, and time.  Skin: Skin is warm. He is diaphoretic.  Psychiatric: He has a normal mood and affect.  Nursing note and vitals reviewed.   ED Course  Procedures (including critical care time) Labs Review Labs Reviewed  COMPREHENSIVE METABOLIC PANEL - Abnormal; Notable for the following:    Sodium 134 (*)    Chloride 94 (*)    Glucose, Bld 241 (*)    BUN 38 (*)    Creatinine, Ser 9.01 (*)    Calcium 8.1 (*)    ALT 14 (*)    GFR calc non Af Amer 6 (*)    GFR calc Af Amer 7 (*)    All other components within normal limits  CBC WITH DIFFERENTIAL/PLATELET - Abnormal; Notable for the following:    RBC 4.20 (*)    Hemoglobin 12.2 (*)    HCT 37.9 (*)    Neutrophils Relative % 86 (*)    Neutro Abs 8.8 (*)    Lymphocytes Relative 7 (*)    All other components within normal limits  I-STAT CG4 LACTIC ACID, ED - Abnormal; Notable for the following:    Lactic Acid, Venous 2.46 (*)    All other components within normal limits  LIPASE, BLOOD      EKG Interpretation   Date/Time:  Tuesday January 18 2015 17:44:50 EDT Ventricular Rate:  96 PR Interval:  152 QRS Duration: 92 QT Interval:  377 QTC Calculation: 476 R Axis:   51 Text Interpretation:  Sinus rhythm Borderline T wave abnormalities  Borderline prolonged QT interval Sinus rhythm T wave abnormality Artifact  Abnormal ekg Confirmed by Carmin Muskrat  MD ([id number]) on 01/18/2015 6:13:20  PM      initial blood pressure 85/45 Cardiac monitor 95 sinus normal Pulse ox 99% normal   8:26 PM Patient states that he is feeling better.  No new complaints.  He  continues to have muscle contractions.   10:04 PM Patient states that he continues to feel better. Blood pressure is now greater than [id number] systolic, appropriate for the patient. He has no new complaints, will follow up with his nephrology team.   MDM  Patient presents with weakness, nausea following elective procedure. Patient has also medical issues, including end-stage renal disease, and there is concern for infection. However, the patient's labs, vital signs are reassuring. Following a liter fluid resuscitation, rest, patient  had substantial increase in his functionality, decrease in nausea. With low suspicion for occult infection, impending hemodynamic collapse, patient was discharged in stable condition to follow-up with nephrology.   Carmin Muskrat, MD 01/18/15 2205

## 2015-01-19 ENCOUNTER — Encounter (HOSPITAL_COMMUNITY): Payer: Self-pay | Admitting: Vascular Surgery

## 2015-01-19 DIAGNOSIS — E119 Type 2 diabetes mellitus without complications: Secondary | ICD-10-CM | POA: Diagnosis not present

## 2015-01-19 DIAGNOSIS — N2581 Secondary hyperparathyroidism of renal origin: Secondary | ICD-10-CM | POA: Diagnosis not present

## 2015-01-19 DIAGNOSIS — D631 Anemia in chronic kidney disease: Secondary | ICD-10-CM | POA: Diagnosis not present

## 2015-01-19 DIAGNOSIS — N186 End stage renal disease: Secondary | ICD-10-CM | POA: Diagnosis not present

## 2015-01-19 DIAGNOSIS — D509 Iron deficiency anemia, unspecified: Secondary | ICD-10-CM | POA: Diagnosis not present

## 2015-01-21 ENCOUNTER — Telehealth: Payer: Self-pay | Admitting: Vascular Surgery

## 2015-01-21 DIAGNOSIS — D631 Anemia in chronic kidney disease: Secondary | ICD-10-CM | POA: Diagnosis not present

## 2015-01-21 DIAGNOSIS — E119 Type 2 diabetes mellitus without complications: Secondary | ICD-10-CM | POA: Diagnosis not present

## 2015-01-21 DIAGNOSIS — N2581 Secondary hyperparathyroidism of renal origin: Secondary | ICD-10-CM | POA: Diagnosis not present

## 2015-01-21 DIAGNOSIS — N186 End stage renal disease: Secondary | ICD-10-CM | POA: Diagnosis not present

## 2015-01-21 DIAGNOSIS — D509 Iron deficiency anemia, unspecified: Secondary | ICD-10-CM | POA: Diagnosis not present

## 2015-01-21 NOTE — Telephone Encounter (Signed)
LM for pt with caretaker, dpm

## 2015-01-21 NOTE — Telephone Encounter (Signed)
-----   Message from Denman George, RN sent at 01/18/2015 10:34 AM EDT ----- Regarding: Zigmund Daniel log; also needs 4 wk. f/u with [facility]/ no duplex   ----- Message -----    From: Alvia Grove, PA-C    Sent: 01/18/2015   9:56 AM      To: Vvs Charge Pool  S/p left upper arm graft 01/18/15  F/u in 4 weeks with Dr. Bridgett Larsson. No duplex.   Thanks Maudie Mercury

## 2015-01-24 DIAGNOSIS — N186 End stage renal disease: Secondary | ICD-10-CM | POA: Diagnosis not present

## 2015-01-24 DIAGNOSIS — D631 Anemia in chronic kidney disease: Secondary | ICD-10-CM | POA: Diagnosis not present

## 2015-01-24 DIAGNOSIS — E119 Type 2 diabetes mellitus without complications: Secondary | ICD-10-CM | POA: Diagnosis not present

## 2015-01-24 DIAGNOSIS — N2581 Secondary hyperparathyroidism of renal origin: Secondary | ICD-10-CM | POA: Diagnosis not present

## 2015-01-24 DIAGNOSIS — D509 Iron deficiency anemia, unspecified: Secondary | ICD-10-CM | POA: Diagnosis not present

## 2015-01-26 DIAGNOSIS — N186 End stage renal disease: Secondary | ICD-10-CM | POA: Diagnosis not present

## 2015-01-26 DIAGNOSIS — N2581 Secondary hyperparathyroidism of renal origin: Secondary | ICD-10-CM | POA: Diagnosis not present

## 2015-01-26 DIAGNOSIS — D509 Iron deficiency anemia, unspecified: Secondary | ICD-10-CM | POA: Diagnosis not present

## 2015-01-26 DIAGNOSIS — E119 Type 2 diabetes mellitus without complications: Secondary | ICD-10-CM | POA: Diagnosis not present

## 2015-01-26 DIAGNOSIS — D631 Anemia in chronic kidney disease: Secondary | ICD-10-CM | POA: Diagnosis not present

## 2015-01-28 DIAGNOSIS — N2581 Secondary hyperparathyroidism of renal origin: Secondary | ICD-10-CM | POA: Diagnosis not present

## 2015-01-28 DIAGNOSIS — N186 End stage renal disease: Secondary | ICD-10-CM | POA: Diagnosis not present

## 2015-01-28 DIAGNOSIS — E119 Type 2 diabetes mellitus without complications: Secondary | ICD-10-CM | POA: Diagnosis not present

## 2015-01-28 DIAGNOSIS — D631 Anemia in chronic kidney disease: Secondary | ICD-10-CM | POA: Diagnosis not present

## 2015-01-28 DIAGNOSIS — D509 Iron deficiency anemia, unspecified: Secondary | ICD-10-CM | POA: Diagnosis not present

## 2015-01-31 DIAGNOSIS — E119 Type 2 diabetes mellitus without complications: Secondary | ICD-10-CM | POA: Diagnosis not present

## 2015-01-31 DIAGNOSIS — D509 Iron deficiency anemia, unspecified: Secondary | ICD-10-CM | POA: Diagnosis not present

## 2015-01-31 DIAGNOSIS — D631 Anemia in chronic kidney disease: Secondary | ICD-10-CM | POA: Diagnosis not present

## 2015-01-31 DIAGNOSIS — N2581 Secondary hyperparathyroidism of renal origin: Secondary | ICD-10-CM | POA: Diagnosis not present

## 2015-01-31 DIAGNOSIS — N186 End stage renal disease: Secondary | ICD-10-CM | POA: Diagnosis not present

## 2015-02-02 DIAGNOSIS — N186 End stage renal disease: Secondary | ICD-10-CM | POA: Diagnosis not present

## 2015-02-02 DIAGNOSIS — N2581 Secondary hyperparathyroidism of renal origin: Secondary | ICD-10-CM | POA: Diagnosis not present

## 2015-02-02 DIAGNOSIS — D631 Anemia in chronic kidney disease: Secondary | ICD-10-CM | POA: Diagnosis not present

## 2015-02-02 DIAGNOSIS — D509 Iron deficiency anemia, unspecified: Secondary | ICD-10-CM | POA: Diagnosis not present

## 2015-02-02 DIAGNOSIS — E119 Type 2 diabetes mellitus without complications: Secondary | ICD-10-CM | POA: Diagnosis not present

## 2015-02-02 DIAGNOSIS — E1129 Type 2 diabetes mellitus with other diabetic kidney complication: Secondary | ICD-10-CM | POA: Diagnosis not present

## 2015-02-03 NOTE — Progress Notes (Signed)
   Subjective:    Patient ID: Shawn [name], male    DOB: 1960-11-24, 54 y.o.   MRN: [id number]  HPI  pT PRESENTS FOR NAIL DEBRIDEMENT  Review of Systems no new findings or systemic changes noted    Objective:   Physical Exam Lower extremity objective findings unchanged faster status reveals pedal pulses DP plus one over 4 PT plus one over 4 bilateral Refill time 3 seconds patient does have some mild varicosities small edema some excoriations of the skin particular lateral malleoli are area right may have bumped. However does have some areas of eschar tissue from excoriations on skin on both lower extremities. Nails thick brittle Crumley friable dystrophic 100 painful both on palpation and debridement at this time is a history of diabetes with decreased epicritic sensation Semmes Weinstein to the forefoot digits and arch. No open wounds no ulcers no secondary infections. Digital contractures associated keratoses are noted.       Assessment & Plan:  Assessment diabetes history peripheral neuropathy and angiopathy. Dystrophic nails are debrided painful mycotic nails and the presence of dystrophy discoloration and friability. No open wounds are identified however diabetic shoes are dispensed with possible 1 pair of shoes and 3 pairs of dual density Plastizote inlays the shoes inlays fit and contour well with full contact to the foot and arch. Patient is given instructions for shoewear and break and also nails debrided as instructed recheck in 3 months for follow-up and continued palliative care in the future as needed

## 2015-02-04 DIAGNOSIS — N186 End stage renal disease: Secondary | ICD-10-CM | POA: Diagnosis not present

## 2015-02-04 DIAGNOSIS — D509 Iron deficiency anemia, unspecified: Secondary | ICD-10-CM | POA: Diagnosis not present

## 2015-02-04 DIAGNOSIS — E119 Type 2 diabetes mellitus without complications: Secondary | ICD-10-CM | POA: Diagnosis not present

## 2015-02-04 DIAGNOSIS — N2581 Secondary hyperparathyroidism of renal origin: Secondary | ICD-10-CM | POA: Diagnosis not present

## 2015-02-04 DIAGNOSIS — D631 Anemia in chronic kidney disease: Secondary | ICD-10-CM | POA: Diagnosis not present

## 2015-02-06 DIAGNOSIS — N186 End stage renal disease: Secondary | ICD-10-CM | POA: Diagnosis not present

## 2015-02-06 DIAGNOSIS — Z992 Dependence on renal dialysis: Secondary | ICD-10-CM | POA: Diagnosis not present

## 2015-02-06 DIAGNOSIS — I120 Hypertensive chronic kidney disease with stage 5 chronic kidney disease or end stage renal disease: Secondary | ICD-10-CM | POA: Diagnosis not present

## 2015-02-07 DIAGNOSIS — N2581 Secondary hyperparathyroidism of renal origin: Secondary | ICD-10-CM | POA: Diagnosis not present

## 2015-02-07 DIAGNOSIS — N186 End stage renal disease: Secondary | ICD-10-CM | POA: Diagnosis not present

## 2015-02-07 DIAGNOSIS — D631 Anemia in chronic kidney disease: Secondary | ICD-10-CM | POA: Diagnosis not present

## 2015-02-07 DIAGNOSIS — E119 Type 2 diabetes mellitus without complications: Secondary | ICD-10-CM | POA: Diagnosis not present

## 2015-02-07 DIAGNOSIS — D509 Iron deficiency anemia, unspecified: Secondary | ICD-10-CM | POA: Diagnosis not present

## 2015-02-08 ENCOUNTER — Ambulatory Visit (INDEPENDENT_AMBULATORY_CARE_PROVIDER_SITE_OTHER): Payer: Medicare Other | Admitting: Nurse Practitioner

## 2015-02-08 ENCOUNTER — Encounter: Payer: Self-pay | Admitting: Nurse Practitioner

## 2015-02-08 VITALS — BP 104/78 | HR 89 | Ht 71.0 in | Wt 219.8 lb

## 2015-02-08 DIAGNOSIS — I672 Cerebral atherosclerosis: Secondary | ICD-10-CM | POA: Diagnosis not present

## 2015-02-08 DIAGNOSIS — I639 Cerebral infarction, unspecified: Secondary | ICD-10-CM

## 2015-02-08 DIAGNOSIS — Z992 Dependence on renal dialysis: Secondary | ICD-10-CM

## 2015-02-08 DIAGNOSIS — N186 End stage renal disease: Secondary | ICD-10-CM

## 2015-02-08 DIAGNOSIS — I6350 Cerebral infarction due to unspecified occlusion or stenosis of unspecified cerebral artery: Secondary | ICD-10-CM

## 2015-02-08 NOTE — Progress Notes (Signed)
[facility]  PATIENT: Shawn [name] DOB: [birth year]   REASON FOR VISIT: Follow-up for history of stroke, gait abnormality, hypotension HISTORY FROM: Patient and wife    HISTORY OF PRESENT ILLNESS:Mr. Shawn [name], 54 year old male returns for followup. He has history of stroke which occurred in April 2012 he is currently on aspirin 325 without further stroke or TIA symptoms. He goes to dialysis 3 times a week. He had a revision of his AV shunt 01/18/15.  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 He denies any recent falls , he ambulates with a single-point cane. He returns for reevaluation. He has vascular risk factors of  Diabetes and hyperlipidemia. His last hemoglobin A1c was 7.5 and he's had adjustments to his insulin. Last carotid Doppler was negative for significant stenosis and transcranial Doppler showed elevated velocities in the left middle cerebral artery suggestive of mild stenosis, globally elevated pulsatility indexes suggestive of diffuse intracranial atherosclerosis. His blood pressure 76/48 in the office this morning, he has not had any fluids, he drank a glass of water and blood pressure increased to 104/78. 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 The patient presented with gait instability, vertigo and dizziness, which have resolved completely, except the patient still has some gait instability. But he can walk pretty well with a cane. 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 The patient had cataract surgery and retinal repair on both of his eyes before the stroke. The patient has a PMH of end stage renal disease, and goes to dialysis 3x a week.  He Is seen urgently today following recent visit to [facility] room on 03/04/12 for  dizziness. He woke up that day feeling dizzy and off-balance and with a sensation of body being pulled to the right side. This was noticed mainly when he was in the upright position and he felt better when he sat down or lay down. He denied any vertigo, nausea, diplopia. He did have some minor tingling in his right hand. His blood pressure was recorded as 133/85 in the ER but they did not check orthostatics. CBC and BMP were unremarkable. Patient states he had similar but milder episode today's later following dialysis when his blood pressure was recorded being low in the 80s. Similarly symptoms were noticeable when he was upright and improved when he lays flat. He complains of difficulty walking and exercising particularly on the days he gets dialysis as he feels quite tired. He had lipid profile checked last month by Dr. Katherine Roan and LDL was elevated but he cannot give me the exact numbers and I do not have those results. He continues to have poor vision annd uses a cane to walk.  Update 08/10/2014 : He returns for follow-up after last visit on 09/29/13. He continues to do well from neurovascular standpoint without recurrent stroke or TIA symptoms. He is tolerating aspirin without significant bleeding or bruising. He had lipid profile checked 3 months ago by his primary physician and it was fine. His blood pressure usually runs quite low blood is low in office today. His last hemoglobin A1c was 6.8 done a few months ago. He had follow-up carotid ultrasound done on 10/07/13 which I personally reviewed showed no significant extra: Stenosis. Transcranial Doppler studies showed  elevated velocities in the left middle cerebral artery suggestive of mild stenosis. Globally elevated pulsatility indexes and suggested diffuse intracranial atherosclerosis. He continues to have poor vision in his eyes from diabetic retinopathy and uses a cane to ambulate.   REVIEW OF SYSTEMS: Full 14 system review of systems performed and  notable only for those listed, all others are neg:  Constitutional: neg  Cardiovascular: neg Ear/Nose/Throat: neg  Skin: neg Eyes: neg Respiratory: neg Gastroitestinal: neg  Hematology/Lymphatic: neg  Endocrine: neg Musculoskeletal:neg Allergy/Immunology: neg Neurological: neg Psychiatric: neg Sleep : neg   ALLERGIES: No Known Allergies  HOME MEDICATIONS: Outpatient Prescriptions Prior to Visit  Medication Sig Dispense Refill  . aspirin 325 MG tablet Take 325 mg by mouth daily.    Marland Kitchen atorvastatin (LIPITOR) 20 MG tablet Take 20 mg by mouth daily.    . B Complex Vitamins (B COMPLEX-B12) TABS Take 1 tablet by mouth daily.    . cinacalcet (SENSIPAR) 30 MG tablet Take 30 mg by mouth daily with supper.    . colesevelam (WELCHOL) 625 MG tablet Take 1,250 mg by mouth daily with supper.    . dorzolamide-timolol (COSOPT) 22.3-6.8 MG/ML ophthalmic solution Place 1 drop into the left eye 2 (two) times daily.    . insulin NPH-insulin regular (NOVOLIN 70/30) (70-30) 100 UNIT/ML injection Inject 15 Units into the skin 2 (two) times daily with a meal.    . ondansetron (ZOFRAN ODT) 8 MG disintegrating tablet Take 1 tablet (8 mg total) by mouth every 8 (eight) hours as needed for nausea or vomiting. 20 tablet 0  . oxyCODONE-acetaminophen (ROXICET) 5-325 MG per tablet Take 1 tablet by mouth every 6 (six) hours as needed for severe pain. (Patient not taking: Reported on 02/08/2015) 20 tablet 0   No facility-administered medications prior to visit.    PAST MEDICAL HISTORY: Past Medical History  Diagnosis Date  . Hypertension   . Stroke   . Diabetes mellitus   . Arthritis   . No pertinent past medical history   . Blind right eye   . 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 Chronic kidney disease     Dialysis since 2011 MWF  . Legally blind   . Anemia     PAST SURGICAL HISTORY: Past Surgical History  Procedure Laterality Date  . Left arm graft  10/2010    . Insertion of dialysis catheter Right   . Eye surgery      retina surgery both eyes, cataract surgery both eyes  . Colonoscopy  06/2012  . Av fistula placement Right 05/12/2013    Procedure: INSERTION OF ARTERIOVENOUS (AV) GORE-TEX GRAFT ARM; ULTRASOUND GUIDED;  Surgeon: Conrad [place], MD;  Location: [facility];  Service: Vascular;  Laterality: Right;  . Hernia repair      right inguinal  . Av fistula placement Left 01/18/2015    Procedure: INSERTION OF ARTERIOVENOUS GORE-TEX GRAFT LEFT UPPER ARM;  Surgeon: Conrad [place], MD;  Location: [facility] OR;  Service: Vascular;  Laterality: Left;    FAMILY HISTORY: Family History  Problem Relation Age of Onset  . Cancer Sister   . Stroke Sister   . Diabetes Mother   . Alzheimer's disease Mother   . Cancer Father   . Heart disease Father     SOCIAL HISTORY: History   Social History  . Marital Status: Married    Spouse Name: N/A  . Number of Children: 0  . Years of Education: 12th   Occupational History  .  Social History Main Topics  . Smoking status: Never Smoker   . Smokeless tobacco: Never Used  . Alcohol Use: No  . Drug Use: No  . Sexual Activity: Not on file   Other Topics Concern  . Not on file   Social History Narrative   Patient lives at home with wife.    Patient is disabled.    Patient has no children.    Patient has a 12 grade education.            PHYSICAL EXAM  Filed Vitals:   02/08/15 1012  BP: 104/78  Pulse: 89  Height: 5\' 11"  (1.803 m)  Weight: 219 lb 12.8 oz (99.701 kg)   Body mass index is 30.67 kg/(m^2). Generalized: Well developed, in no acute distress  Head: normocephalic and atraumatic,. Oropharynx benign  Neck: Supple, no carotid bruits  Cardiac: Regular rate rhythm, no murmur    Neurological examination   Mentation: Alert oriented to time, place, history taking. Follows all commands speech and language fluent  Cranial nerve II-XII: Pupils are enlarged right greater than left and  neither react. Conjugate eye movements are restricted on the left just moving medial and movement on the right, visual fields are restricted to confrontation on the left visual field testing central and medial vision field loss laterally. Right full vision loss Facial sensation and strength were normal. hearing was intact to finger rubbing bilaterally. Uvula tongue midline. head turning and shoulder shrug were normal and symmetric.Tongue protrusion into cheek strength was normal. Motor: normal bulk and tone, full strength in the BUE, BLE, fine finger movements normal, no pronator drift. No focal weakness Sensory: normal and symmetric to light touch, pinprick, and vibration  Coordination: finger-nose-finger, heel-to-shin bilaterally, no dysmetria Reflexes: 1+ upper and lower and symmetric Gait and Station: Wide-based gait with cane, unsteady with tandem    DIAGNOSTIC DATA (LABS, IMAGING, TESTING) - I reviewed patient records, labs, notes, testing and imaging myself where available.  Lab Results  Component Value Date   WBC 10.2 01/18/2015   HGB 12.2* 01/18/2015   HCT 37.9* 01/18/2015   MCV 90.2 01/18/2015   PLT 228 01/18/2015      Component Value Date/Time   NA 134* 01/18/2015 1844   K 4.6 01/18/2015 1844   CL 94* 01/18/2015 1844   CO2 25 01/18/2015 1844   GLUCOSE 241* 01/18/2015 1844   BUN 38* 01/18/2015 1844   CREATININE 9.01* 01/18/2015 1844   CALCIUM 8.1* 01/18/2015 1844   CALCIUM 7.7* 01/28/2010 1225   PROT 7.5 01/18/2015 1844   ALBUMIN 3.6 01/18/2015 1844   AST 18 01/18/2015 1844   ALT 14* 01/18/2015 1844   ALKPHOS 113 01/18/2015 1844   BILITOT 0.7 01/18/2015 1844   GFRNONAA 6* 01/18/2015 1844   GFRAA 7* 01/18/2015 1844    ASSESSMENT AND PLAN  54 y.o. year old male  has a past medical history of Stroke; Diabetes mellitus; Arthritis;  Blind right eye; Unspecified cerebral artery occlusion with cerebral infarction (04/02/2013); Chronic kidney disease; Legally blind; and  Anemia. Here to follow-up.  Continue aspirin 325 secondary stroke prevention Control of diabetes with hemoglobin A1c below 6.5 LDL cholesterol goal below 100 I spent additional 10 minutes in total face to face time with the patient more than 50% of which was spent counseling and coordination of care, reviewing test results reviewing medications and discussing and reviewing the diagnosis of dizziness due to low blood pressure and further treatment options.  Increase fluid intake, blood pressure hypotensive  today at 76/48 first check after a glass of water 104/78 Follow-up in 22months Dennie Bible, [facility], [facility], [facility] [address], [place], [place] [address] [phone]

## 2015-02-08 NOTE — Patient Instructions (Addendum)
Continue aspirin 325 secondary stroke prevention Control of diabetes with hemoglobin A1c below 6.5 LDL cholesterol goal below 100 Increase fluid intake, blood pressure hypotensive today at 76/48 first check after a glass of water 104/78 Follow-up in 65months

## 2015-02-08 NOTE — Progress Notes (Signed)
I agree with the above plan 

## 2015-02-09 DIAGNOSIS — E119 Type 2 diabetes mellitus without complications: Secondary | ICD-10-CM | POA: Diagnosis not present

## 2015-02-09 DIAGNOSIS — D509 Iron deficiency anemia, unspecified: Secondary | ICD-10-CM | POA: Diagnosis not present

## 2015-02-09 DIAGNOSIS — N2581 Secondary hyperparathyroidism of renal origin: Secondary | ICD-10-CM | POA: Diagnosis not present

## 2015-02-09 DIAGNOSIS — D631 Anemia in chronic kidney disease: Secondary | ICD-10-CM | POA: Diagnosis not present

## 2015-02-09 DIAGNOSIS — N186 End stage renal disease: Secondary | ICD-10-CM | POA: Diagnosis not present

## 2015-02-11 DIAGNOSIS — N186 End stage renal disease: Secondary | ICD-10-CM | POA: Diagnosis not present

## 2015-02-11 DIAGNOSIS — D509 Iron deficiency anemia, unspecified: Secondary | ICD-10-CM | POA: Diagnosis not present

## 2015-02-11 DIAGNOSIS — E119 Type 2 diabetes mellitus without complications: Secondary | ICD-10-CM | POA: Diagnosis not present

## 2015-02-11 DIAGNOSIS — D631 Anemia in chronic kidney disease: Secondary | ICD-10-CM | POA: Diagnosis not present

## 2015-02-11 DIAGNOSIS — N2581 Secondary hyperparathyroidism of renal origin: Secondary | ICD-10-CM | POA: Diagnosis not present

## 2015-02-14 DIAGNOSIS — N2581 Secondary hyperparathyroidism of renal origin: Secondary | ICD-10-CM | POA: Diagnosis not present

## 2015-02-14 DIAGNOSIS — N186 End stage renal disease: Secondary | ICD-10-CM | POA: Diagnosis not present

## 2015-02-14 DIAGNOSIS — D509 Iron deficiency anemia, unspecified: Secondary | ICD-10-CM | POA: Diagnosis not present

## 2015-02-14 DIAGNOSIS — E119 Type 2 diabetes mellitus without complications: Secondary | ICD-10-CM | POA: Diagnosis not present

## 2015-02-14 DIAGNOSIS — D631 Anemia in chronic kidney disease: Secondary | ICD-10-CM | POA: Diagnosis not present

## 2015-02-16 DIAGNOSIS — D631 Anemia in chronic kidney disease: Secondary | ICD-10-CM | POA: Diagnosis not present

## 2015-02-16 DIAGNOSIS — D509 Iron deficiency anemia, unspecified: Secondary | ICD-10-CM | POA: Diagnosis not present

## 2015-02-16 DIAGNOSIS — N2581 Secondary hyperparathyroidism of renal origin: Secondary | ICD-10-CM | POA: Diagnosis not present

## 2015-02-16 DIAGNOSIS — N186 End stage renal disease: Secondary | ICD-10-CM | POA: Diagnosis not present

## 2015-02-16 DIAGNOSIS — E119 Type 2 diabetes mellitus without complications: Secondary | ICD-10-CM | POA: Diagnosis not present

## 2015-02-17 ENCOUNTER — Encounter: Payer: Self-pay | Admitting: Vascular Surgery

## 2015-02-18 ENCOUNTER — Ambulatory Visit (INDEPENDENT_AMBULATORY_CARE_PROVIDER_SITE_OTHER): Payer: Self-pay | Admitting: Vascular Surgery

## 2015-02-18 ENCOUNTER — Encounter: Payer: Self-pay | Admitting: Vascular Surgery

## 2015-02-18 VITALS — BP 123/72 | HR 87 | Temp 98.4°F | Ht 71.0 in | Wt 219.0 lb

## 2015-02-18 DIAGNOSIS — N186 End stage renal disease: Secondary | ICD-10-CM

## 2015-02-18 DIAGNOSIS — Z992 Dependence on renal dialysis: Secondary | ICD-10-CM

## 2015-02-18 NOTE — Progress Notes (Signed)
    Postoperative Access Visit   History of Present Illness  Shawn [name] is a 54 y.o. year old male who presents for postoperative follow-up for: LUA AVG (Date: 01/18/15).  The patient's wounds are healed.  The patient notes no steal symptoms.  The patient is able to complete their activities of daily living.  The patient's current symptoms are: none.  For VQI Use Only  PRE-ADM LIVING: Home  AMB STATUS: Ambulatory  Physical Examination Filed Vitals:   02/18/15 0840  BP: 123/72  Pulse: 87  Temp: 98.4 F (36.9 C)    LUE: Incisions are healed, skin feels warm, hand grip is 5/5, sensation in digits is intact, palpable thrill, bruit can be auscultated   Medical Decision Making  Shawn [name] is a 54 y.o. year old male who presents s/p LUA AVG.  The patient's access is ready for use.  The patient's tunneled dialysis catheter can be removed after two successful cannulations and completed dialysis treatments.  Thank you for allowing [place] to participate in this patient's care.  Adele Barthel, MD [facility] Office: [phone] Pager: [phone]  02/18/2015, 9:09 AM

## 2015-02-19 DIAGNOSIS — D509 Iron deficiency anemia, unspecified: Secondary | ICD-10-CM | POA: Diagnosis not present

## 2015-02-19 DIAGNOSIS — N2581 Secondary hyperparathyroidism of renal origin: Secondary | ICD-10-CM | POA: Diagnosis not present

## 2015-02-19 DIAGNOSIS — D631 Anemia in chronic kidney disease: Secondary | ICD-10-CM | POA: Diagnosis not present

## 2015-02-19 DIAGNOSIS — E119 Type 2 diabetes mellitus without complications: Secondary | ICD-10-CM | POA: Diagnosis not present

## 2015-02-19 DIAGNOSIS — N186 End stage renal disease: Secondary | ICD-10-CM | POA: Diagnosis not present

## 2015-02-21 DIAGNOSIS — N186 End stage renal disease: Secondary | ICD-10-CM | POA: Diagnosis not present

## 2015-02-21 DIAGNOSIS — N2581 Secondary hyperparathyroidism of renal origin: Secondary | ICD-10-CM | POA: Diagnosis not present

## 2015-02-21 DIAGNOSIS — E119 Type 2 diabetes mellitus without complications: Secondary | ICD-10-CM | POA: Diagnosis not present

## 2015-02-21 DIAGNOSIS — D509 Iron deficiency anemia, unspecified: Secondary | ICD-10-CM | POA: Diagnosis not present

## 2015-02-21 DIAGNOSIS — D631 Anemia in chronic kidney disease: Secondary | ICD-10-CM | POA: Diagnosis not present

## 2015-02-23 DIAGNOSIS — N186 End stage renal disease: Secondary | ICD-10-CM | POA: Diagnosis not present

## 2015-02-23 DIAGNOSIS — E119 Type 2 diabetes mellitus without complications: Secondary | ICD-10-CM | POA: Diagnosis not present

## 2015-02-23 DIAGNOSIS — D509 Iron deficiency anemia, unspecified: Secondary | ICD-10-CM | POA: Diagnosis not present

## 2015-02-23 DIAGNOSIS — N2581 Secondary hyperparathyroidism of renal origin: Secondary | ICD-10-CM | POA: Diagnosis not present

## 2015-02-23 DIAGNOSIS — D631 Anemia in chronic kidney disease: Secondary | ICD-10-CM | POA: Diagnosis not present

## 2015-02-24 ENCOUNTER — Ambulatory Visit (INDEPENDENT_AMBULATORY_CARE_PROVIDER_SITE_OTHER): Payer: Medicare Other | Admitting: Podiatry

## 2015-02-24 ENCOUNTER — Encounter: Payer: Self-pay | Admitting: Podiatry

## 2015-02-24 DIAGNOSIS — B351 Tinea unguium: Secondary | ICD-10-CM | POA: Diagnosis not present

## 2015-02-24 DIAGNOSIS — M79673 Pain in unspecified foot: Secondary | ICD-10-CM

## 2015-02-24 DIAGNOSIS — E1140 Type 2 diabetes mellitus with diabetic neuropathy, unspecified: Secondary | ICD-10-CM

## 2015-02-24 NOTE — Progress Notes (Signed)
Patient ID: Shawn [name], male   DOB: [birth year], 54 y.o.   MRN: [id number] Complaint:  Visit Type: Patient returns to my office for continued preventative foot care services. Complaint: Patient states" my nails have grown long and thick and become painful to walk and wear shoes" Patient has been diagnosed with DM with neuropathy and angiopathy.. The patient presents for preventative foot care services. No changes to ROS  Podiatric Exam: Vascular: dorsalis pedis and posterior tibial pulses are palpable bilateral. Capillary return is immediate. Temperature gradient is WNL. Skin turgor WNL  Sensorium: Diminished Semmes Weinstein monofilament test. Normal tactile sensation bilaterally. Nail Exam: Pt has thick disfigured discolored nails with subungual debris noted bilateral entire nail hallux through fifth toenails Ulcer Exam: There is no evidence of ulcer or pre-ulcerative changes or infection. Orthopedic Exam: Muscle tone and strength are WNL. No limitations in general ROM. No crepitus or effusions noted. Foot type and digits show no abnormalities. Bony prominences are unremarkable. Skin: No Porokeratosis. No infection or ulcers.  Dried blister covering that is peeling on left hallux.  No signs of redness or infection or swelling.  Diagnosis:  Onychomycosis, , Pain in right toe, pain in left toes  Treatment & Plan Procedures and Treatment: Consent by patient was obtained for treatment procedures. The patient understood the discussion of treatment and procedures well. All questions were answered thoroughly reviewed. Debridement of mycotic and hypertrophic toenails, 1 through 5 bilateral and clearing of subungual debris. No ulceration, no infection noted.  Return Visit-Office Procedure: Patient instructed to return to the office for a follow up visit 3 months for continued evaluation and treatment.

## 2015-02-25 DIAGNOSIS — D631 Anemia in chronic kidney disease: Secondary | ICD-10-CM | POA: Diagnosis not present

## 2015-02-25 DIAGNOSIS — N186 End stage renal disease: Secondary | ICD-10-CM | POA: Diagnosis not present

## 2015-02-25 DIAGNOSIS — E119 Type 2 diabetes mellitus without complications: Secondary | ICD-10-CM | POA: Diagnosis not present

## 2015-02-25 DIAGNOSIS — N2581 Secondary hyperparathyroidism of renal origin: Secondary | ICD-10-CM | POA: Diagnosis not present

## 2015-02-25 DIAGNOSIS — D509 Iron deficiency anemia, unspecified: Secondary | ICD-10-CM | POA: Diagnosis not present

## 2015-02-28 DIAGNOSIS — E119 Type 2 diabetes mellitus without complications: Secondary | ICD-10-CM | POA: Diagnosis not present

## 2015-02-28 DIAGNOSIS — D631 Anemia in chronic kidney disease: Secondary | ICD-10-CM | POA: Diagnosis not present

## 2015-02-28 DIAGNOSIS — D509 Iron deficiency anemia, unspecified: Secondary | ICD-10-CM | POA: Diagnosis not present

## 2015-02-28 DIAGNOSIS — N186 End stage renal disease: Secondary | ICD-10-CM | POA: Diagnosis not present

## 2015-02-28 DIAGNOSIS — N2581 Secondary hyperparathyroidism of renal origin: Secondary | ICD-10-CM | POA: Diagnosis not present

## 2015-03-02 DIAGNOSIS — E119 Type 2 diabetes mellitus without complications: Secondary | ICD-10-CM | POA: Diagnosis not present

## 2015-03-02 DIAGNOSIS — D509 Iron deficiency anemia, unspecified: Secondary | ICD-10-CM | POA: Diagnosis not present

## 2015-03-02 DIAGNOSIS — N186 End stage renal disease: Secondary | ICD-10-CM | POA: Diagnosis not present

## 2015-03-02 DIAGNOSIS — D631 Anemia in chronic kidney disease: Secondary | ICD-10-CM | POA: Diagnosis not present

## 2015-03-02 DIAGNOSIS — N2581 Secondary hyperparathyroidism of renal origin: Secondary | ICD-10-CM | POA: Diagnosis not present

## 2015-03-04 DIAGNOSIS — E119 Type 2 diabetes mellitus without complications: Secondary | ICD-10-CM | POA: Diagnosis not present

## 2015-03-04 DIAGNOSIS — D631 Anemia in chronic kidney disease: Secondary | ICD-10-CM | POA: Diagnosis not present

## 2015-03-04 DIAGNOSIS — N186 End stage renal disease: Secondary | ICD-10-CM | POA: Diagnosis not present

## 2015-03-04 DIAGNOSIS — N2581 Secondary hyperparathyroidism of renal origin: Secondary | ICD-10-CM | POA: Diagnosis not present

## 2015-03-04 DIAGNOSIS — D509 Iron deficiency anemia, unspecified: Secondary | ICD-10-CM | POA: Diagnosis not present

## 2015-03-07 DIAGNOSIS — D631 Anemia in chronic kidney disease: Secondary | ICD-10-CM | POA: Diagnosis not present

## 2015-03-07 DIAGNOSIS — N186 End stage renal disease: Secondary | ICD-10-CM | POA: Diagnosis not present

## 2015-03-07 DIAGNOSIS — E119 Type 2 diabetes mellitus without complications: Secondary | ICD-10-CM | POA: Diagnosis not present

## 2015-03-07 DIAGNOSIS — N2581 Secondary hyperparathyroidism of renal origin: Secondary | ICD-10-CM | POA: Diagnosis not present

## 2015-03-07 DIAGNOSIS — D509 Iron deficiency anemia, unspecified: Secondary | ICD-10-CM | POA: Diagnosis not present

## 2015-03-09 DIAGNOSIS — D631 Anemia in chronic kidney disease: Secondary | ICD-10-CM | POA: Diagnosis not present

## 2015-03-09 DIAGNOSIS — N2581 Secondary hyperparathyroidism of renal origin: Secondary | ICD-10-CM | POA: Diagnosis not present

## 2015-03-09 DIAGNOSIS — D509 Iron deficiency anemia, unspecified: Secondary | ICD-10-CM | POA: Diagnosis not present

## 2015-03-09 DIAGNOSIS — Z992 Dependence on renal dialysis: Secondary | ICD-10-CM | POA: Diagnosis not present

## 2015-03-09 DIAGNOSIS — I120 Hypertensive chronic kidney disease with stage 5 chronic kidney disease or end stage renal disease: Secondary | ICD-10-CM | POA: Diagnosis not present

## 2015-03-09 DIAGNOSIS — N186 End stage renal disease: Secondary | ICD-10-CM | POA: Diagnosis not present

## 2015-03-09 DIAGNOSIS — E119 Type 2 diabetes mellitus without complications: Secondary | ICD-10-CM | POA: Diagnosis not present

## 2015-03-11 DIAGNOSIS — D509 Iron deficiency anemia, unspecified: Secondary | ICD-10-CM | POA: Diagnosis not present

## 2015-03-11 DIAGNOSIS — N186 End stage renal disease: Secondary | ICD-10-CM | POA: Diagnosis not present

## 2015-03-11 DIAGNOSIS — D631 Anemia in chronic kidney disease: Secondary | ICD-10-CM | POA: Diagnosis not present

## 2015-03-11 DIAGNOSIS — N2581 Secondary hyperparathyroidism of renal origin: Secondary | ICD-10-CM | POA: Diagnosis not present

## 2015-03-11 DIAGNOSIS — E119 Type 2 diabetes mellitus without complications: Secondary | ICD-10-CM | POA: Diagnosis not present

## 2015-03-14 DIAGNOSIS — N186 End stage renal disease: Secondary | ICD-10-CM | POA: Diagnosis not present

## 2015-03-14 DIAGNOSIS — D509 Iron deficiency anemia, unspecified: Secondary | ICD-10-CM | POA: Diagnosis not present

## 2015-03-14 DIAGNOSIS — N2581 Secondary hyperparathyroidism of renal origin: Secondary | ICD-10-CM | POA: Diagnosis not present

## 2015-03-14 DIAGNOSIS — E119 Type 2 diabetes mellitus without complications: Secondary | ICD-10-CM | POA: Diagnosis not present

## 2015-03-14 DIAGNOSIS — D631 Anemia in chronic kidney disease: Secondary | ICD-10-CM | POA: Diagnosis not present

## 2015-03-16 DIAGNOSIS — D509 Iron deficiency anemia, unspecified: Secondary | ICD-10-CM | POA: Diagnosis not present

## 2015-03-16 DIAGNOSIS — E119 Type 2 diabetes mellitus without complications: Secondary | ICD-10-CM | POA: Diagnosis not present

## 2015-03-16 DIAGNOSIS — N186 End stage renal disease: Secondary | ICD-10-CM | POA: Diagnosis not present

## 2015-03-16 DIAGNOSIS — D631 Anemia in chronic kidney disease: Secondary | ICD-10-CM | POA: Diagnosis not present

## 2015-03-16 DIAGNOSIS — N2581 Secondary hyperparathyroidism of renal origin: Secondary | ICD-10-CM | POA: Diagnosis not present

## 2015-03-18 DIAGNOSIS — N2581 Secondary hyperparathyroidism of renal origin: Secondary | ICD-10-CM | POA: Diagnosis not present

## 2015-03-18 DIAGNOSIS — E119 Type 2 diabetes mellitus without complications: Secondary | ICD-10-CM | POA: Diagnosis not present

## 2015-03-18 DIAGNOSIS — N186 End stage renal disease: Secondary | ICD-10-CM | POA: Diagnosis not present

## 2015-03-18 DIAGNOSIS — D509 Iron deficiency anemia, unspecified: Secondary | ICD-10-CM | POA: Diagnosis not present

## 2015-03-18 DIAGNOSIS — D631 Anemia in chronic kidney disease: Secondary | ICD-10-CM | POA: Diagnosis not present

## 2015-03-21 DIAGNOSIS — N186 End stage renal disease: Secondary | ICD-10-CM | POA: Diagnosis not present

## 2015-03-21 DIAGNOSIS — D509 Iron deficiency anemia, unspecified: Secondary | ICD-10-CM | POA: Diagnosis not present

## 2015-03-21 DIAGNOSIS — D631 Anemia in chronic kidney disease: Secondary | ICD-10-CM | POA: Diagnosis not present

## 2015-03-21 DIAGNOSIS — N2581 Secondary hyperparathyroidism of renal origin: Secondary | ICD-10-CM | POA: Diagnosis not present

## 2015-03-21 DIAGNOSIS — E119 Type 2 diabetes mellitus without complications: Secondary | ICD-10-CM | POA: Diagnosis not present

## 2015-03-23 DIAGNOSIS — E119 Type 2 diabetes mellitus without complications: Secondary | ICD-10-CM | POA: Diagnosis not present

## 2015-03-23 DIAGNOSIS — D509 Iron deficiency anemia, unspecified: Secondary | ICD-10-CM | POA: Diagnosis not present

## 2015-03-23 DIAGNOSIS — N186 End stage renal disease: Secondary | ICD-10-CM | POA: Diagnosis not present

## 2015-03-23 DIAGNOSIS — N2581 Secondary hyperparathyroidism of renal origin: Secondary | ICD-10-CM | POA: Diagnosis not present

## 2015-03-23 DIAGNOSIS — D631 Anemia in chronic kidney disease: Secondary | ICD-10-CM | POA: Diagnosis not present

## 2015-03-25 DIAGNOSIS — D509 Iron deficiency anemia, unspecified: Secondary | ICD-10-CM | POA: Diagnosis not present

## 2015-03-25 DIAGNOSIS — E119 Type 2 diabetes mellitus without complications: Secondary | ICD-10-CM | POA: Diagnosis not present

## 2015-03-25 DIAGNOSIS — N2581 Secondary hyperparathyroidism of renal origin: Secondary | ICD-10-CM | POA: Diagnosis not present

## 2015-03-25 DIAGNOSIS — D631 Anemia in chronic kidney disease: Secondary | ICD-10-CM | POA: Diagnosis not present

## 2015-03-25 DIAGNOSIS — N186 End stage renal disease: Secondary | ICD-10-CM | POA: Diagnosis not present

## 2015-03-28 DIAGNOSIS — D509 Iron deficiency anemia, unspecified: Secondary | ICD-10-CM | POA: Diagnosis not present

## 2015-03-28 DIAGNOSIS — D631 Anemia in chronic kidney disease: Secondary | ICD-10-CM | POA: Diagnosis not present

## 2015-03-28 DIAGNOSIS — E119 Type 2 diabetes mellitus without complications: Secondary | ICD-10-CM | POA: Diagnosis not present

## 2015-03-28 DIAGNOSIS — N2581 Secondary hyperparathyroidism of renal origin: Secondary | ICD-10-CM | POA: Diagnosis not present

## 2015-03-28 DIAGNOSIS — N186 End stage renal disease: Secondary | ICD-10-CM | POA: Diagnosis not present

## 2015-03-30 DIAGNOSIS — N2581 Secondary hyperparathyroidism of renal origin: Secondary | ICD-10-CM | POA: Diagnosis not present

## 2015-03-30 DIAGNOSIS — D631 Anemia in chronic kidney disease: Secondary | ICD-10-CM | POA: Diagnosis not present

## 2015-03-30 DIAGNOSIS — E119 Type 2 diabetes mellitus without complications: Secondary | ICD-10-CM | POA: Diagnosis not present

## 2015-03-30 DIAGNOSIS — D509 Iron deficiency anemia, unspecified: Secondary | ICD-10-CM | POA: Diagnosis not present

## 2015-03-30 DIAGNOSIS — N186 End stage renal disease: Secondary | ICD-10-CM | POA: Diagnosis not present

## 2015-04-01 DIAGNOSIS — N186 End stage renal disease: Secondary | ICD-10-CM | POA: Diagnosis not present

## 2015-04-01 DIAGNOSIS — D509 Iron deficiency anemia, unspecified: Secondary | ICD-10-CM | POA: Diagnosis not present

## 2015-04-01 DIAGNOSIS — N2581 Secondary hyperparathyroidism of renal origin: Secondary | ICD-10-CM | POA: Diagnosis not present

## 2015-04-01 DIAGNOSIS — E119 Type 2 diabetes mellitus without complications: Secondary | ICD-10-CM | POA: Diagnosis not present

## 2015-04-01 DIAGNOSIS — D631 Anemia in chronic kidney disease: Secondary | ICD-10-CM | POA: Diagnosis not present

## 2015-04-04 DIAGNOSIS — N186 End stage renal disease: Secondary | ICD-10-CM | POA: Diagnosis not present

## 2015-04-04 DIAGNOSIS — N2581 Secondary hyperparathyroidism of renal origin: Secondary | ICD-10-CM | POA: Diagnosis not present

## 2015-04-04 DIAGNOSIS — D509 Iron deficiency anemia, unspecified: Secondary | ICD-10-CM | POA: Diagnosis not present

## 2015-04-04 DIAGNOSIS — D631 Anemia in chronic kidney disease: Secondary | ICD-10-CM | POA: Diagnosis not present

## 2015-04-04 DIAGNOSIS — E119 Type 2 diabetes mellitus without complications: Secondary | ICD-10-CM | POA: Diagnosis not present

## 2015-04-06 DIAGNOSIS — E119 Type 2 diabetes mellitus without complications: Secondary | ICD-10-CM | POA: Diagnosis not present

## 2015-04-06 DIAGNOSIS — N2581 Secondary hyperparathyroidism of renal origin: Secondary | ICD-10-CM | POA: Diagnosis not present

## 2015-04-06 DIAGNOSIS — D509 Iron deficiency anemia, unspecified: Secondary | ICD-10-CM | POA: Diagnosis not present

## 2015-04-06 DIAGNOSIS — D631 Anemia in chronic kidney disease: Secondary | ICD-10-CM | POA: Diagnosis not present

## 2015-04-06 DIAGNOSIS — N186 End stage renal disease: Secondary | ICD-10-CM | POA: Diagnosis not present

## 2015-04-07 DIAGNOSIS — N186 End stage renal disease: Secondary | ICD-10-CM | POA: Diagnosis not present

## 2015-04-07 DIAGNOSIS — I871 Compression of vein: Secondary | ICD-10-CM | POA: Diagnosis not present

## 2015-04-07 DIAGNOSIS — M79602 Pain in left arm: Secondary | ICD-10-CM | POA: Diagnosis not present

## 2015-04-07 DIAGNOSIS — T82858D Stenosis of vascular prosthetic devices, implants and grafts, subsequent encounter: Secondary | ICD-10-CM | POA: Diagnosis not present

## 2015-04-08 DIAGNOSIS — N2581 Secondary hyperparathyroidism of renal origin: Secondary | ICD-10-CM | POA: Diagnosis not present

## 2015-04-08 DIAGNOSIS — Z992 Dependence on renal dialysis: Secondary | ICD-10-CM | POA: Diagnosis not present

## 2015-04-08 DIAGNOSIS — I120 Hypertensive chronic kidney disease with stage 5 chronic kidney disease or end stage renal disease: Secondary | ICD-10-CM | POA: Diagnosis not present

## 2015-04-08 DIAGNOSIS — N186 End stage renal disease: Secondary | ICD-10-CM | POA: Diagnosis not present

## 2015-04-08 DIAGNOSIS — E119 Type 2 diabetes mellitus without complications: Secondary | ICD-10-CM | POA: Diagnosis not present

## 2015-04-08 DIAGNOSIS — D509 Iron deficiency anemia, unspecified: Secondary | ICD-10-CM | POA: Diagnosis not present

## 2015-04-08 DIAGNOSIS — D631 Anemia in chronic kidney disease: Secondary | ICD-10-CM | POA: Diagnosis not present

## 2015-04-11 DIAGNOSIS — Z23 Encounter for immunization: Secondary | ICD-10-CM | POA: Diagnosis not present

## 2015-04-11 DIAGNOSIS — N2581 Secondary hyperparathyroidism of renal origin: Secondary | ICD-10-CM | POA: Diagnosis not present

## 2015-04-11 DIAGNOSIS — N186 End stage renal disease: Secondary | ICD-10-CM | POA: Diagnosis not present

## 2015-04-11 DIAGNOSIS — D631 Anemia in chronic kidney disease: Secondary | ICD-10-CM | POA: Diagnosis not present

## 2015-04-11 DIAGNOSIS — D509 Iron deficiency anemia, unspecified: Secondary | ICD-10-CM | POA: Diagnosis not present

## 2015-04-11 DIAGNOSIS — E119 Type 2 diabetes mellitus without complications: Secondary | ICD-10-CM | POA: Diagnosis not present

## 2015-04-13 DIAGNOSIS — D631 Anemia in chronic kidney disease: Secondary | ICD-10-CM | POA: Diagnosis not present

## 2015-04-13 DIAGNOSIS — N186 End stage renal disease: Secondary | ICD-10-CM | POA: Diagnosis not present

## 2015-04-13 DIAGNOSIS — Z23 Encounter for immunization: Secondary | ICD-10-CM | POA: Diagnosis not present

## 2015-04-13 DIAGNOSIS — N2581 Secondary hyperparathyroidism of renal origin: Secondary | ICD-10-CM | POA: Diagnosis not present

## 2015-04-13 DIAGNOSIS — D509 Iron deficiency anemia, unspecified: Secondary | ICD-10-CM | POA: Diagnosis not present

## 2015-04-13 DIAGNOSIS — E119 Type 2 diabetes mellitus without complications: Secondary | ICD-10-CM | POA: Diagnosis not present

## 2015-04-15 DIAGNOSIS — D631 Anemia in chronic kidney disease: Secondary | ICD-10-CM | POA: Diagnosis not present

## 2015-04-15 DIAGNOSIS — N186 End stage renal disease: Secondary | ICD-10-CM | POA: Diagnosis not present

## 2015-04-15 DIAGNOSIS — D509 Iron deficiency anemia, unspecified: Secondary | ICD-10-CM | POA: Diagnosis not present

## 2015-04-15 DIAGNOSIS — N2581 Secondary hyperparathyroidism of renal origin: Secondary | ICD-10-CM | POA: Diagnosis not present

## 2015-04-15 DIAGNOSIS — E119 Type 2 diabetes mellitus without complications: Secondary | ICD-10-CM | POA: Diagnosis not present

## 2015-04-15 DIAGNOSIS — Z23 Encounter for immunization: Secondary | ICD-10-CM | POA: Diagnosis not present

## 2015-04-18 DIAGNOSIS — E119 Type 2 diabetes mellitus without complications: Secondary | ICD-10-CM | POA: Diagnosis not present

## 2015-04-18 DIAGNOSIS — N2581 Secondary hyperparathyroidism of renal origin: Secondary | ICD-10-CM | POA: Diagnosis not present

## 2015-04-18 DIAGNOSIS — D631 Anemia in chronic kidney disease: Secondary | ICD-10-CM | POA: Diagnosis not present

## 2015-04-18 DIAGNOSIS — Z23 Encounter for immunization: Secondary | ICD-10-CM | POA: Diagnosis not present

## 2015-04-18 DIAGNOSIS — N186 End stage renal disease: Secondary | ICD-10-CM | POA: Diagnosis not present

## 2015-04-18 DIAGNOSIS — D509 Iron deficiency anemia, unspecified: Secondary | ICD-10-CM | POA: Diagnosis not present

## 2015-04-20 DIAGNOSIS — E119 Type 2 diabetes mellitus without complications: Secondary | ICD-10-CM | POA: Diagnosis not present

## 2015-04-20 DIAGNOSIS — N2581 Secondary hyperparathyroidism of renal origin: Secondary | ICD-10-CM | POA: Diagnosis not present

## 2015-04-20 DIAGNOSIS — Z23 Encounter for immunization: Secondary | ICD-10-CM | POA: Diagnosis not present

## 2015-04-20 DIAGNOSIS — N186 End stage renal disease: Secondary | ICD-10-CM | POA: Diagnosis not present

## 2015-04-20 DIAGNOSIS — D631 Anemia in chronic kidney disease: Secondary | ICD-10-CM | POA: Diagnosis not present

## 2015-04-20 DIAGNOSIS — D509 Iron deficiency anemia, unspecified: Secondary | ICD-10-CM | POA: Diagnosis not present

## 2015-04-22 DIAGNOSIS — D509 Iron deficiency anemia, unspecified: Secondary | ICD-10-CM | POA: Diagnosis not present

## 2015-04-22 DIAGNOSIS — N2581 Secondary hyperparathyroidism of renal origin: Secondary | ICD-10-CM | POA: Diagnosis not present

## 2015-04-22 DIAGNOSIS — E119 Type 2 diabetes mellitus without complications: Secondary | ICD-10-CM | POA: Diagnosis not present

## 2015-04-22 DIAGNOSIS — N186 End stage renal disease: Secondary | ICD-10-CM | POA: Diagnosis not present

## 2015-04-22 DIAGNOSIS — D631 Anemia in chronic kidney disease: Secondary | ICD-10-CM | POA: Diagnosis not present

## 2015-04-22 DIAGNOSIS — Z23 Encounter for immunization: Secondary | ICD-10-CM | POA: Diagnosis not present

## 2015-04-25 DIAGNOSIS — Z23 Encounter for immunization: Secondary | ICD-10-CM | POA: Diagnosis not present

## 2015-04-25 DIAGNOSIS — N186 End stage renal disease: Secondary | ICD-10-CM | POA: Diagnosis not present

## 2015-04-25 DIAGNOSIS — D509 Iron deficiency anemia, unspecified: Secondary | ICD-10-CM | POA: Diagnosis not present

## 2015-04-25 DIAGNOSIS — D631 Anemia in chronic kidney disease: Secondary | ICD-10-CM | POA: Diagnosis not present

## 2015-04-25 DIAGNOSIS — E119 Type 2 diabetes mellitus without complications: Secondary | ICD-10-CM | POA: Diagnosis not present

## 2015-04-25 DIAGNOSIS — N2581 Secondary hyperparathyroidism of renal origin: Secondary | ICD-10-CM | POA: Diagnosis not present

## 2015-04-27 DIAGNOSIS — D631 Anemia in chronic kidney disease: Secondary | ICD-10-CM | POA: Diagnosis not present

## 2015-04-27 DIAGNOSIS — E119 Type 2 diabetes mellitus without complications: Secondary | ICD-10-CM | POA: Diagnosis not present

## 2015-04-27 DIAGNOSIS — N2581 Secondary hyperparathyroidism of renal origin: Secondary | ICD-10-CM | POA: Diagnosis not present

## 2015-04-27 DIAGNOSIS — D509 Iron deficiency anemia, unspecified: Secondary | ICD-10-CM | POA: Diagnosis not present

## 2015-04-27 DIAGNOSIS — N186 End stage renal disease: Secondary | ICD-10-CM | POA: Diagnosis not present

## 2015-04-27 DIAGNOSIS — Z23 Encounter for immunization: Secondary | ICD-10-CM | POA: Diagnosis not present

## 2015-04-29 ENCOUNTER — Encounter: Payer: Self-pay | Admitting: Vascular Surgery

## 2015-04-29 ENCOUNTER — Other Ambulatory Visit: Payer: Self-pay

## 2015-04-29 ENCOUNTER — Ambulatory Visit (INDEPENDENT_AMBULATORY_CARE_PROVIDER_SITE_OTHER): Payer: Medicare Other | Admitting: Vascular Surgery

## 2015-04-29 ENCOUNTER — Ambulatory Visit (HOSPITAL_COMMUNITY)
Admission: RE | Admit: 2015-04-29 | Discharge: 2015-04-29 | Disposition: A | Discharge status: Home / Self Care | Payer: Medicare Other | Source: Ambulatory Visit | Attending: Vascular Surgery | Admitting: Vascular Surgery

## 2015-04-29 ENCOUNTER — Other Ambulatory Visit: Payer: Self-pay | Admitting: Vascular Surgery

## 2015-04-29 VITALS — BP 123/63 | HR 80 | Ht 71.0 in | Wt 219.0 lb

## 2015-04-29 DIAGNOSIS — T82511A Breakdown (mechanical) of surgically created arteriovenous shunt, initial encounter: Secondary | ICD-10-CM

## 2015-04-29 DIAGNOSIS — Y832 Surgical operation with anastomosis, bypass or graft as the cause of abnormal reaction of the patient, or of later complication, without mention of misadventure at the time of the procedure: Secondary | ICD-10-CM | POA: Diagnosis not present

## 2015-04-29 DIAGNOSIS — E785 Hyperlipidemia, unspecified: Secondary | ICD-10-CM | POA: Diagnosis not present

## 2015-04-29 DIAGNOSIS — N186 End stage renal disease: Secondary | ICD-10-CM | POA: Diagnosis not present

## 2015-04-29 DIAGNOSIS — Z992 Dependence on renal dialysis: Secondary | ICD-10-CM | POA: Diagnosis not present

## 2015-04-29 DIAGNOSIS — E119 Type 2 diabetes mellitus without complications: Secondary | ICD-10-CM | POA: Diagnosis not present

## 2015-04-29 DIAGNOSIS — I6350 Cerebral infarction due to unspecified occlusion or stenosis of unspecified cerebral artery: Secondary | ICD-10-CM | POA: Diagnosis not present

## 2015-04-29 DIAGNOSIS — R202 Paresthesia of skin: Secondary | ICD-10-CM | POA: Insufficient documentation

## 2015-04-29 DIAGNOSIS — R200 Anesthesia of skin: Secondary | ICD-10-CM

## 2015-04-29 NOTE — Progress Notes (Signed)
Established Dialysis Access  History of Present Illness  Shawn [name] is a 54 y.o. (05-27-1961) male who presents for re-evaluation of LUA AVG.  Patient had: LUA AVG (Date: 01/18/15).  Over the last 5 weeks, the patient has been having progressive increase in steal sx.  On HD, he notes his L hand "claws" and he has severe pain in the hand during the run.  He denies baseline difficulties with ADLs  The patient's PMH, PSH, SH, and FamHx are unchanged from 01/18/15.  Current Outpatient Prescriptions  Medication Sig Dispense Refill  . aspirin 325 MG tablet Take 325 mg by mouth daily.    Marland Kitchen atorvastatin (LIPITOR) 20 MG tablet Take 20 mg by mouth daily.    . B Complex Vitamins (B COMPLEX-B12) TABS Take 1 tablet by mouth daily.    . calcium acetate (PHOSLO) 667 MG capsule Take 1,000 mg by mouth 3 (three) times daily with meals.    . cinacalcet (SENSIPAR) 30 MG tablet Take 30 mg by mouth daily with supper.    . colesevelam (WELCHOL) 625 MG tablet Take 1,250 mg by mouth daily with supper.    . dorzolamide-timolol (COSOPT) 22.3-6.8 MG/ML ophthalmic solution Place 1 drop into the left eye 2 (two) times daily.    . insulin NPH-insulin regular (NOVOLIN 70/30) (70-30) 100 UNIT/ML injection Inject 15 Units into the skin 2 (two) times daily with a meal.    . ondansetron (ZOFRAN ODT) 8 MG disintegrating tablet Take 1 tablet (8 mg total) by mouth every 8 (eight) hours as needed for nausea or vomiting. 20 tablet 0   No current facility-administered medications for this visit.    No Known Allergies  On ROS today: + steal sx, ESRD-HD: M-W-F   Physical Examination  Filed Vitals:   04/29/15 0846  BP: 123/63  Pulse: 80  Height: 5\' 11"  (1.803 m)  Weight: 219 lb (99.338 kg)  SpO2: 100%   Body mass index is 30.56 kg/(m^2).  General: A&O x 3, WD, blind  Pulmonary: Sym exp, good air movt, CTAB, no rales, rhonchi, & wheezing  Cardiac: RRR, Nl S1, S2, no Murmurs, rubs or  gallops  Vascular: Vessel Right Left  Radial Faintly Palpable Not Palpable  Ulnar Not Palpable Not Palpable  Brachial Palpable Palpable   Gastrointestinal: soft, NTND, no G/R, bo HSM, no masses, no CVAT B  Musculoskeletal: M/S 5/5 throughout , Extremities without  ischemic changes except evidence of B thenar atrophy (present prior to procedure), palpable thrill in access in LUA AVG, + bruit in access  Neurologic: Pain and light touch intact in extremities , Motor exam as listed above  Non-Invasive Vascular Imaging  Steal study LUE  (Date: 04/29/2015):  50 mm Hg augmentation with compression of access   Medical Decision Making  Shawn [name] is a 54 y.o. male who presents with ESRD requiring hemodialysis.    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  I offered the patient: plication of the LUA AVG vs ligation of the graft.  He elected to ligate the graft.  This is scheduled for this coming Tuesday, 25 OCT 16.  The patient is aware that the risks of access surgery include but are not limited to: bleeding, infection, steal syndrome, nerve damage, ischemic monomelic neuropathy, failure of access to mature, and possible need for additional access procedures in the future.   Adele Barthel, MD [facility] Office: [phone] Pager:  [phone]  04/29/2015, 9:56 AM

## 2015-04-30 DIAGNOSIS — E119 Type 2 diabetes mellitus without complications: Secondary | ICD-10-CM | POA: Diagnosis not present

## 2015-04-30 DIAGNOSIS — N2581 Secondary hyperparathyroidism of renal origin: Secondary | ICD-10-CM | POA: Diagnosis not present

## 2015-04-30 DIAGNOSIS — D631 Anemia in chronic kidney disease: Secondary | ICD-10-CM | POA: Diagnosis not present

## 2015-04-30 DIAGNOSIS — D509 Iron deficiency anemia, unspecified: Secondary | ICD-10-CM | POA: Diagnosis not present

## 2015-04-30 DIAGNOSIS — Z23 Encounter for immunization: Secondary | ICD-10-CM | POA: Diagnosis not present

## 2015-04-30 DIAGNOSIS — N186 End stage renal disease: Secondary | ICD-10-CM | POA: Diagnosis not present

## 2015-05-02 ENCOUNTER — Encounter (HOSPITAL_COMMUNITY): Payer: Self-pay | Admitting: *Deleted

## 2015-05-02 DIAGNOSIS — D631 Anemia in chronic kidney disease: Secondary | ICD-10-CM | POA: Diagnosis not present

## 2015-05-02 DIAGNOSIS — D509 Iron deficiency anemia, unspecified: Secondary | ICD-10-CM | POA: Diagnosis not present

## 2015-05-02 DIAGNOSIS — N186 End stage renal disease: Secondary | ICD-10-CM | POA: Diagnosis not present

## 2015-05-02 DIAGNOSIS — E119 Type 2 diabetes mellitus without complications: Secondary | ICD-10-CM | POA: Diagnosis not present

## 2015-05-02 DIAGNOSIS — N2581 Secondary hyperparathyroidism of renal origin: Secondary | ICD-10-CM | POA: Diagnosis not present

## 2015-05-02 DIAGNOSIS — Z23 Encounter for immunization: Secondary | ICD-10-CM | POA: Diagnosis not present

## 2015-05-02 MED ORDER — CHLORHEXIDINE GLUCONATE CLOTH 2 % EX PADS: 6.0000 | MEDICATED_PAD | Freq: Once | CUTANEOUS | Status: DC

## 2015-05-02 MED ORDER — SODIUM CHLORIDE 0.9 % IV SOLN
INTRAVENOUS | Status: DC
  Administered 2015-05-03: 07:00:00 via INTRAVENOUS

## 2015-05-02 MED ORDER — DEXTROSE 5 % IV SOLN
1.5000 g | INTRAVENOUS | Status: DC
  Filled 2015-05-02: qty 1.5

## 2015-05-03 ENCOUNTER — Ambulatory Visit (HOSPITAL_COMMUNITY): Payer: Medicare Other | Admitting: Certified Registered Nurse Anesthetist

## 2015-05-03 ENCOUNTER — Encounter (HOSPITAL_COMMUNITY): Payer: Self-pay

## 2015-05-03 ENCOUNTER — Telehealth: Payer: Self-pay | Admitting: Vascular Surgery

## 2015-05-03 ENCOUNTER — Ambulatory Visit (HOSPITAL_COMMUNITY)
Admission: RE | Admit: 2015-05-03 | Discharge: 2015-05-03 | Disposition: A | Discharge status: Home / Self Care | Payer: Medicare Other | Source: Ambulatory Visit | Attending: Vascular Surgery | Admitting: Vascular Surgery

## 2015-05-03 ENCOUNTER — Other Ambulatory Visit: Payer: Medicare Other | Admitting: *Deleted

## 2015-05-03 ENCOUNTER — Encounter (HOSPITAL_COMMUNITY)
Admission: RE | Disposition: A | Discharge status: Home / Self Care | Payer: Self-pay | Source: Ambulatory Visit | Attending: Vascular Surgery

## 2015-05-03 DIAGNOSIS — I120 Hypertensive chronic kidney disease with stage 5 chronic kidney disease or end stage renal disease: Secondary | ICD-10-CM | POA: Insufficient documentation

## 2015-05-03 DIAGNOSIS — E119 Type 2 diabetes mellitus without complications: Secondary | ICD-10-CM | POA: Insufficient documentation

## 2015-05-03 DIAGNOSIS — Z992 Dependence on renal dialysis: Secondary | ICD-10-CM | POA: Diagnosis not present

## 2015-05-03 DIAGNOSIS — T82510A Breakdown (mechanical) of surgically created arteriovenous fistula, initial encounter: Secondary | ICD-10-CM | POA: Diagnosis not present

## 2015-05-03 DIAGNOSIS — Z794 Long term (current) use of insulin: Secondary | ICD-10-CM | POA: Diagnosis not present

## 2015-05-03 DIAGNOSIS — Z7982 Long term (current) use of aspirin: Secondary | ICD-10-CM | POA: Diagnosis not present

## 2015-05-03 DIAGNOSIS — D649 Anemia, unspecified: Secondary | ICD-10-CM | POA: Diagnosis not present

## 2015-05-03 DIAGNOSIS — N186 End stage renal disease: Secondary | ICD-10-CM | POA: Insufficient documentation

## 2015-05-03 DIAGNOSIS — T82898S Other specified complication of vascular prosthetic devices, implants and grafts, sequela: Secondary | ICD-10-CM

## 2015-05-03 DIAGNOSIS — M1990 Unspecified osteoarthritis, unspecified site: Secondary | ICD-10-CM | POA: Diagnosis not present

## 2015-05-03 DIAGNOSIS — Y832 Surgical operation with anastomosis, bypass or graft as the cause of abnormal reaction of the patient, or of later complication, without mention of misadventure at the time of the procedure: Secondary | ICD-10-CM | POA: Insufficient documentation

## 2015-05-03 DIAGNOSIS — T82898A Other specified complication of vascular prosthetic devices, implants and grafts, initial encounter: Secondary | ICD-10-CM | POA: Diagnosis not present

## 2015-05-03 HISTORY — DX: Other specified postprocedural states: Z98.890

## 2015-05-03 HISTORY — DX: Other specified postprocedural states: R11.2

## 2015-05-03 HISTORY — PX: LIGATION ARTERIOVENOUS GORTEX GRAFT: SHX5947

## 2015-05-03 HISTORY — DX: Adverse effect of unspecified anesthetic, initial encounter: T41.45XA

## 2015-05-03 HISTORY — DX: Other complications of anesthesia, initial encounter: T88.59XA

## 2015-05-03 HISTORY — DX: Reserved for inherently not codable concepts without codable children: IMO0001

## 2015-05-03 LAB — GLUCOSE, CAPILLARY
Glucose-Capillary: 88 mg/dL (ref 65–99)
Glucose-Capillary: 108 mg/dL — ABNORMAL HIGH (ref 65–99)

## 2015-05-03 LAB — POCT I-STAT 4, (NA,K, GLUC, HGB,HCT)
Glucose, Bld: 106 mg/dL — ABNORMAL HIGH (ref 65–99)
HEMATOCRIT: 40 % (ref 39.0–52.0)
HEMOGLOBIN: 13.6 g/dL (ref 13.0–17.0)
POTASSIUM: 3.5 mmol/L (ref 3.5–5.1)
SODIUM: 137 mmol/L (ref 135–145)

## 2015-05-03 SURGERY — LIGATION ARTERIOVENOUS GORTEX GRAFT
Anesthesia: Monitor Anesthesia Care | Site: Arm Upper | Laterality: Left

## 2015-05-03 MED ORDER — OXYCODONE-ACETAMINOPHEN 5-325 MG PO TABS: 1.0000 | ORAL_TABLET | ORAL | Status: DC | PRN

## 2015-05-03 MED ORDER — PROPOFOL 10 MG/ML IV BOLUS
INTRAVENOUS | Status: AC
  Filled 2015-05-03: qty 20

## 2015-05-03 MED ORDER — PROPOFOL 500 MG/50ML IV EMUL
INTRAVENOUS | Status: DC | PRN
  Administered 2015-05-03: 70 ug/kg/min via INTRAVENOUS

## 2015-05-03 MED ORDER — MIDAZOLAM HCL 2 MG/2ML IJ SOLN
INTRAMUSCULAR | Status: AC
  Filled 2015-05-03: qty 4

## 2015-05-03 MED ORDER — 0.9 % SODIUM CHLORIDE (POUR BTL) OPTIME
TOPICAL | Status: DC | PRN
  Administered 2015-05-03: 1000 mL

## 2015-05-03 MED ORDER — MIDAZOLAM HCL 5 MG/5ML IJ SOLN
INTRAMUSCULAR | Status: DC | PRN
  Administered 2015-05-03: 1 mg via INTRAVENOUS

## 2015-05-03 MED ORDER — PHENYLEPHRINE 40 MCG/ML (10ML) SYRINGE FOR IV PUSH (FOR BLOOD PRESSURE SUPPORT)
PREFILLED_SYRINGE | INTRAVENOUS | Status: AC
  Filled 2015-05-03: qty 10

## 2015-05-03 MED ORDER — EPHEDRINE SULFATE 50 MG/ML IJ SOLN
INTRAMUSCULAR | Status: DC | PRN
  Administered 2015-05-03 (×2): 5 mg via INTRAVENOUS

## 2015-05-03 MED ORDER — FENTANYL CITRATE (PF) 100 MCG/2ML IJ SOLN
INTRAMUSCULAR | Status: DC | PRN
  Administered 2015-05-03: 25 ug via INTRAVENOUS
  Administered 2015-05-03: 50 ug via INTRAVENOUS

## 2015-05-03 MED ORDER — FENTANYL CITRATE (PF) 250 MCG/5ML IJ SOLN
INTRAMUSCULAR | Status: AC
  Filled 2015-05-03: qty 5

## 2015-05-03 MED ORDER — THROMBIN 20000 UNITS EX SOLR
CUTANEOUS | Status: AC
  Filled 2015-05-03: qty 20000

## 2015-05-03 MED ORDER — ONDANSETRON HCL 4 MG/2ML IJ SOLN
INTRAMUSCULAR | Status: AC
  Filled 2015-05-03: qty 2

## 2015-05-03 MED ORDER — EPHEDRINE SULFATE 50 MG/ML IJ SOLN
INTRAMUSCULAR | Status: AC
  Filled 2015-05-03: qty 1

## 2015-05-03 MED ORDER — PHENYLEPHRINE HCL 10 MG/ML IJ SOLN
10.0000 mg | INTRAVENOUS | Status: DC | PRN
  Administered 2015-05-03 (×2): 25 ug/min via INTRAVENOUS

## 2015-05-03 MED ORDER — LIDOCAINE HCL (PF) 1 % IJ SOLN
INTRAMUSCULAR | Status: AC
  Filled 2015-05-03: qty 30

## 2015-05-03 MED ORDER — PROPOFOL 500 MG/50ML IV EMUL
INTRAVENOUS | Status: DC | PRN
  Administered 2015-05-03: 50 ug via INTRAVENOUS

## 2015-05-03 MED ORDER — LIDOCAINE HCL (PF) 1 % IJ SOLN
INTRAMUSCULAR | Status: DC | PRN
  Administered 2015-05-03: 30 mL

## 2015-05-03 MED ORDER — LIDOCAINE HCL (CARDIAC) 20 MG/ML IV SOLN
INTRAVENOUS | Status: AC
  Filled 2015-05-03: qty 5

## 2015-05-03 SURGICAL SUPPLY — 34 items
CANISTER SUCTION 2500CC (MISCELLANEOUS) ×3 IMPLANT
COVER PROBE W GEL 5X96 (DRAPES) ×3 IMPLANT
ELECT REM PT RETURN 9FT ADLT (ELECTROSURGICAL) ×3
ELECTRODE REM PT RTRN 9FT ADLT (ELECTROSURGICAL) ×1 IMPLANT
GAUZE SPONGE 4X4 12PLY STRL (GAUZE/BANDAGES/DRESSINGS) ×3 IMPLANT
GEL ULTRASOUND 20GR AQUASONIC (MISCELLANEOUS) IMPLANT
GLOVE BIO SURGEON STRL SZ 6.5 (GLOVE) ×1 IMPLANT
GLOVE BIO SURGEON STRL SZ7 (GLOVE) ×3 IMPLANT
GLOVE BIO SURGEONS STRL SZ 6.5 (GLOVE) ×1
GLOVE BIOGEL PI IND STRL 6.5 (GLOVE) ×1 IMPLANT
GLOVE BIOGEL PI IND STRL 7.5 (GLOVE) ×1 IMPLANT
GLOVE BIOGEL PI INDICATOR 6.5 (GLOVE) ×2
GLOVE BIOGEL PI INDICATOR 7.5 (GLOVE) ×2
GOWN STRL REUS W/ TWL LRG LVL3 (GOWN DISPOSABLE) ×3 IMPLANT
GOWN STRL REUS W/TWL LRG LVL3 (GOWN DISPOSABLE) ×9
KIT BASIN OR (CUSTOM PROCEDURE TRAY) ×3 IMPLANT
KIT ROOM TURNOVER OR (KITS) ×3 IMPLANT
LIQUID BAND (GAUZE/BANDAGES/DRESSINGS) ×3 IMPLANT
NS IRRIG 1000ML POUR BTL (IV SOLUTION) ×3 IMPLANT
PACK CV ACCESS (CUSTOM PROCEDURE TRAY) ×3 IMPLANT
PAD ARMBOARD 7.5X6 YLW CONV (MISCELLANEOUS) ×6 IMPLANT
SPONGE SURGIFOAM ABS GEL 100 (HEMOSTASIS) IMPLANT
SUT ETHILON 3 0 PS 1 (SUTURE) IMPLANT
SUT MNCRL AB 4-0 PS2 18 (SUTURE) ×6 IMPLANT
SUT PROLENE 5 0 C 1 24 (SUTURE) ×2 IMPLANT
SUT PROLENE 6 0 BV (SUTURE) IMPLANT
SUT SILK 0 TIES 10X30 (SUTURE) ×3 IMPLANT
SUT SILK 3 0 SH 30 (SUTURE) ×2 IMPLANT
SUT VIC AB 3-0 SH 27 (SUTURE) ×3
SUT VIC AB 3-0 SH 27X BRD (SUTURE) ×1 IMPLANT
SWAB COLLECTION DEVICE MRSA (MISCELLANEOUS) IMPLANT
TUBE ANAEROBIC SPECIMEN COL (MISCELLANEOUS) IMPLANT
UNDERPAD 30X30 INCONTINENT (UNDERPADS AND DIAPERS) ×3 IMPLANT
WATER STERILE IRR 1000ML POUR (IV SOLUTION) ×3 IMPLANT

## 2015-05-03 NOTE — Transfer of Care (Signed)
Immediate Anesthesia Transfer of Care Note  Patient: Shawn [name]  Procedure(s) Performed: Procedure(s): LIGATION ARTERIOVENOUS GORTEX GRAFT-LEFT UPPER ARM (Left)  Patient Location: PACU  Anesthesia Type:MAC  Level of Consciousness: awake, alert  and oriented  Airway & Oxygen Therapy: Patient connected to face mask oxygen  Post-op Assessment: Post -op Vital signs reviewed and stable  Post vital signs: stable  Last Vitals:  Filed Vitals:   05/03/15 0606  BP: 91/49  Pulse: 75  Temp: 123456 C    Complications: No apparent anesthesia complications

## 2015-05-03 NOTE — H&P (View-Only) (Signed)
Established Dialysis Access  History of Present Illness  Shawn [name] is a 54 y.o. ([birth year]) male who presents for re-evaluation of LUA AVG.  Patient had: LUA AVG (Date: 01/18/15).  Over the last 5 weeks, the patient has been having progressive increase in steal sx.  On HD, he notes his L hand "claws" and he has severe pain in the hand during the run.  He denies baseline difficulties with ADLs  The patient's PMH, PSH, SH, and FamHx are unchanged from 01/18/15.  Current Outpatient Prescriptions  Medication Sig Dispense Refill  . aspirin 325 MG tablet Take 325 mg by mouth daily.    Marland Kitchen atorvastatin (LIPITOR) 20 MG tablet Take 20 mg by mouth daily.    . B Complex Vitamins (B COMPLEX-B12) TABS Take 1 tablet by mouth daily.    . calcium acetate (PHOSLO) 667 MG capsule Take 1,000 mg by mouth 3 (three) times daily with meals.    . cinacalcet (SENSIPAR) 30 MG tablet Take 30 mg by mouth daily with supper.    . colesevelam (WELCHOL) 625 MG tablet Take 1,250 mg by mouth daily with supper.    . dorzolamide-timolol (COSOPT) 22.3-6.8 MG/ML ophthalmic solution Place 1 drop into the left eye 2 (two) times daily.    . insulin NPH-insulin regular (NOVOLIN 70/30) (70-30) 100 UNIT/ML injection Inject 15 Units into the skin 2 (two) times daily with a meal.    . ondansetron (ZOFRAN ODT) 8 MG disintegrating tablet Take 1 tablet (8 mg total) by mouth every 8 (eight) hours as needed for nausea or vomiting. 20 tablet 0   No current facility-administered medications for this visit.    No Known Allergies  On ROS today: + steal sx, ESRD-HD: M-W-F   Physical Examination  Filed Vitals:   04/29/15 0846  BP: 123/63  Pulse: 80  Height: 5\' 11"  (1.803 m)  Weight: 219 lb (99.338 kg)  SpO2: 100%   Body mass index is 30.56 kg/(m^2).  General: A&O x 3, WD, blind  Pulmonary: Sym exp, good air movt, CTAB, no rales, rhonchi, & wheezing  Cardiac: RRR, Nl S1, S2, no Murmurs, rubs or  gallops  Vascular: Vessel Right Left  Radial Faintly Palpable Not Palpable  Ulnar Not Palpable Not Palpable  Brachial Palpable Palpable   Gastrointestinal: soft, NTND, no G/R, bo HSM, no masses, no CVAT B  Musculoskeletal: M/S 5/5 throughout , Extremities without  ischemic changes except evidence of B thenar atrophy (present prior to procedure), palpable thrill in access in LUA AVG, + bruit in access  Neurologic: Pain and light touch intact in extremities , Motor exam as listed above  Non-Invasive Vascular Imaging  Steal study LUE  (Date: 04/29/2015):  50 mm Hg augmentation with compression of access   Medical Decision Making  Shawn [name] is a 54 y.o. male who presents with ESRD requiring hemodialysis.    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  I offered the patient: plication of the LUA AVG vs ligation of the graft.  He elected to ligate the graft.  This is scheduled for this coming Tuesday, 25 OCT 16.  The patient is aware that the risks of access surgery include but are not limited to: bleeding, infection, steal syndrome, nerve damage, ischemic monomelic neuropathy, failure of access to mature, and possible need for additional access procedures in the future.   Adele Barthel, MD [facility] Office: [phone] Pager:  [phone]  04/29/2015, 9:56 AM

## 2015-05-03 NOTE — Interval H&P Note (Signed)
History and Physical Interval Note:  05/03/2015 7:26 AM  Shawn [name]  has presented today for surgery, with the diagnosis of End Stage Renal Disease N18.6 Steal Syndrome Arteriovenous  T82.510  The various methods of treatment have been discussed with the patient and family. After consideration of risks, benefits and other options for treatment, the patient has consented to  Procedure(s): LIGATION ARTERIOVENOUS GORTEX GRAFT-LEFT UPPER ARM (Left) as a surgical intervention .  The patient's history has been reviewed, patient examined, no change in status, stable for surgery.  I have reviewed the patient's chart and labs.  Questions were answered to the patient's satisfaction.     Adele Barthel

## 2015-05-03 NOTE — Op Note (Signed)
    OPERATIVE NOTE   PROCEDURE: 1. Ligation of left upper arm arteriovenous graft   PRE-OPERATIVE DIAGNOSIS: steal syndrome  POST-OPERATIVE DIAGNOSIS: same as above   SURGEON: Adele Barthel, MD  ANESTHESIA: local and MAC  ESTIMATED BLOOD LOSS: 50 cc  FINDING(S): 1.  Palpable radial pulse at end of case  SPECIMEN(S):  none  INDICATIONS:   Shawn [name] is a 54 y.o. male who presents with left hand steal symptoms.  Patient baseline has atrophy in both hands due to unknown etiologies.  His steal study was equivocal with 50 mm Hg gradient with access compression, so I felt he did have an element of steal.  I offered the patient two options, plication and ligation, and he elected to proceed with ligation.  He is aware the risk include but are not limited to: bleeding, infection, and nerve injury.  DESCRIPTION: After obtaining full informed written consent, the patient was brought back to the operating room and placed supine upon the operating table.  The patient received IV antibiotics prior to induction.  After obtaining adequate anesthesia, the patient was prepped and draped in the standard fashion for: left arm access procedure.  I injected 7 cc of 1% 1% lidocaine without epinephrine at the prior incision site to obtain anesthesia.  I made an incision and dissected out the distal graft with electrocautery.  I dissected down to the arterial anastomosis and then dissected out another 3 cm of the graft length.  I clamped the graft adjacent to the anastomosis, leaving a short segment of graft on the artery as a patch.  I suture ligated the graft proximally with a 2-0 silk.  I then transected the graft and then oversewed the segment adjacent to the artery with a double layer of 5-0 prolene.  I washed out the incision.  There was no active bleeding.  The subcutaneous tissue was reapproximated with 3-0 Vicryl.  The skin was reapproximated with a running stitch of 4-0 Vicryl.  The skin was  cleaned, dried, and reinforced with Dermabond.  Distally, I could feel a weak radial pulse which was not present previously.   COMPLICATIONS: none  CONDITION: stable   Adele Barthel, MD [facility] Office: [phone] Pager: [phone]  05/03/2015, 8:25 AM

## 2015-05-03 NOTE — Telephone Encounter (Signed)
-----   Message from Mena Goes, RN sent at 05/03/2015 10:18 AM EDT ----- Regarding: schedule  Made referral to OT   ----- Message -----    From: Conrad Peak Place, MD    Sent: 05/03/2015   8:34 AM      To: [facility] [address]  Shawn [name] [id number] 01-14-1961  PROCEDURE: Ligation of left upper arm arteriovenous graft   Follow-up: 4 weeks  Additional:  Refer patient to OT for left hand steal syndrome

## 2015-05-03 NOTE — Telephone Encounter (Signed)
Spoke with pt, dpm °

## 2015-05-03 NOTE — Anesthesia Preprocedure Evaluation (Addendum)
Anesthesia Evaluation  Patient identified by MRN, date of birth, ID band Patient awake    Reviewed: Allergy & Precautions, NPO status , Patient's Chart, lab work & pertinent test results  History of Anesthesia Complications (+) PONV and history of anesthetic complications  Airway Mallampati: II  TM Distance: >3 FB Neck ROM: Full    Dental  (+) Missing, Dental Advisory Given,    Pulmonary shortness of breath and with exertion,    Pulmonary exam normal breath sounds clear to auscultation       Cardiovascular hypertension, + Peripheral Vascular Disease  Normal cardiovascular exam Rhythm:Regular Rate:Normal     Neuro/Psych Right sided weakness. Utilizes a cane to ambulate CVA, Residual Symptoms    GI/Hepatic   Endo/Other  diabetes  Renal/GU DialysisRenal diseaseHemodialysis M,W, F     Musculoskeletal  (+) Arthritis ,   Abdominal Normal abdominal exam  (+)   Peds  Hematology  (+) anemia ,   Anesthesia Other Findings   Reproductive/Obstetrics                          Anesthesia Physical Anesthesia Plan  ASA: III  Anesthesia Plan: MAC   Post-op Pain Management:    Induction:   Airway Management Planned:   Additional Equipment:   Intra-op Plan:   Post-operative Plan:   Informed Consent: I have reviewed the patients History and Physical, chart, labs and discussed the procedure including the risks, benefits and alternatives for the proposed anesthesia with the patient or authorized representative who has indicated his/her understanding and acceptance.     Plan Discussed with: CRNA and Anesthesiologist  Anesthesia Plan Comments:        Anesthesia Quick Evaluation

## 2015-05-03 NOTE — Progress Notes (Signed)
Called by PACU staff to cap off Hemodialysis Catheter that was accessed by anesthesia for surgery. Instilled heparin 1,000 units per ml into arterial port. 1.9 ml used. Line capped and taped. Venous port was not accessed.

## 2015-05-03 NOTE — Anesthesia Procedure Notes (Signed)
Procedure Name: MAC Date/Time: 05/03/2015 7:59 AM Performed by: Lavell Luster Pre-anesthesia Checklist: Patient identified, Emergency Drugs available, Suction available, Patient being monitored and Timeout performed Patient Re-evaluated:Patient Re-evaluated prior to inductionOxygen Delivery Method: Simple face mask Intubation Type: IV induction

## 2015-05-03 NOTE — Anesthesia Postprocedure Evaluation (Signed)
  Anesthesia Post-op Note  Patient: Shawn [name]  Procedure(s) Performed: Procedure(s): LIGATION ARTERIOVENOUS GORTEX GRAFT-LEFT UPPER ARM (Left)  Patient Location: PACU  Anesthesia Type:MAC  Level of Consciousness: awake, alert  and oriented  Airway and Oxygen Therapy: Patient Spontanous Breathing and Patient connected to nasal cannula oxygen  Post-op Pain: mild  Post-op Assessment: Post-op Vital signs reviewed, Patient's Cardiovascular Status Stable, Respiratory Function Stable, Patent Airway and Pain level controlled              Post-op Vital Signs: stable  Last Vitals:  Filed Vitals:   05/03/15 1030  BP: 121/70  Pulse:   Temp:   Resp: 16    Complications: No apparent anesthesia complications

## 2015-05-04 ENCOUNTER — Encounter (HOSPITAL_COMMUNITY): Payer: Self-pay | Admitting: Vascular Surgery

## 2015-05-04 DIAGNOSIS — Z23 Encounter for immunization: Secondary | ICD-10-CM | POA: Diagnosis not present

## 2015-05-04 DIAGNOSIS — N186 End stage renal disease: Secondary | ICD-10-CM | POA: Diagnosis not present

## 2015-05-04 DIAGNOSIS — D509 Iron deficiency anemia, unspecified: Secondary | ICD-10-CM | POA: Diagnosis not present

## 2015-05-04 DIAGNOSIS — E1129 Type 2 diabetes mellitus with other diabetic kidney complication: Secondary | ICD-10-CM | POA: Diagnosis not present

## 2015-05-04 DIAGNOSIS — E119 Type 2 diabetes mellitus without complications: Secondary | ICD-10-CM | POA: Diagnosis not present

## 2015-05-04 DIAGNOSIS — N2581 Secondary hyperparathyroidism of renal origin: Secondary | ICD-10-CM | POA: Diagnosis not present

## 2015-05-04 DIAGNOSIS — D631 Anemia in chronic kidney disease: Secondary | ICD-10-CM | POA: Diagnosis not present

## 2015-05-06 ENCOUNTER — Encounter: Payer: Self-pay | Admitting: Nephrology

## 2015-05-06 DIAGNOSIS — N186 End stage renal disease: Secondary | ICD-10-CM | POA: Diagnosis not present

## 2015-05-06 DIAGNOSIS — N2581 Secondary hyperparathyroidism of renal origin: Secondary | ICD-10-CM | POA: Diagnosis not present

## 2015-05-06 DIAGNOSIS — D631 Anemia in chronic kidney disease: Secondary | ICD-10-CM | POA: Diagnosis not present

## 2015-05-06 DIAGNOSIS — E119 Type 2 diabetes mellitus without complications: Secondary | ICD-10-CM | POA: Diagnosis not present

## 2015-05-06 DIAGNOSIS — D509 Iron deficiency anemia, unspecified: Secondary | ICD-10-CM | POA: Diagnosis not present

## 2015-05-06 DIAGNOSIS — Z23 Encounter for immunization: Secondary | ICD-10-CM | POA: Diagnosis not present

## 2015-05-09 DIAGNOSIS — I120 Hypertensive chronic kidney disease with stage 5 chronic kidney disease or end stage renal disease: Secondary | ICD-10-CM | POA: Diagnosis not present

## 2015-05-09 DIAGNOSIS — Z992 Dependence on renal dialysis: Secondary | ICD-10-CM | POA: Diagnosis not present

## 2015-05-09 DIAGNOSIS — N186 End stage renal disease: Secondary | ICD-10-CM | POA: Diagnosis not present

## 2015-05-09 DIAGNOSIS — Z23 Encounter for immunization: Secondary | ICD-10-CM | POA: Diagnosis not present

## 2015-05-09 DIAGNOSIS — D631 Anemia in chronic kidney disease: Secondary | ICD-10-CM | POA: Diagnosis not present

## 2015-05-09 DIAGNOSIS — E119 Type 2 diabetes mellitus without complications: Secondary | ICD-10-CM | POA: Diagnosis not present

## 2015-05-09 DIAGNOSIS — N2581 Secondary hyperparathyroidism of renal origin: Secondary | ICD-10-CM | POA: Diagnosis not present

## 2015-05-09 DIAGNOSIS — D509 Iron deficiency anemia, unspecified: Secondary | ICD-10-CM | POA: Diagnosis not present

## 2015-05-11 DIAGNOSIS — N2581 Secondary hyperparathyroidism of renal origin: Secondary | ICD-10-CM | POA: Diagnosis not present

## 2015-05-11 DIAGNOSIS — D631 Anemia in chronic kidney disease: Secondary | ICD-10-CM | POA: Diagnosis not present

## 2015-05-11 DIAGNOSIS — N186 End stage renal disease: Secondary | ICD-10-CM | POA: Diagnosis not present

## 2015-05-11 DIAGNOSIS — D509 Iron deficiency anemia, unspecified: Secondary | ICD-10-CM | POA: Diagnosis not present

## 2015-05-11 DIAGNOSIS — E119 Type 2 diabetes mellitus without complications: Secondary | ICD-10-CM | POA: Diagnosis not present

## 2015-05-13 DIAGNOSIS — N2581 Secondary hyperparathyroidism of renal origin: Secondary | ICD-10-CM | POA: Diagnosis not present

## 2015-05-13 DIAGNOSIS — D631 Anemia in chronic kidney disease: Secondary | ICD-10-CM | POA: Diagnosis not present

## 2015-05-13 DIAGNOSIS — N186 End stage renal disease: Secondary | ICD-10-CM | POA: Diagnosis not present

## 2015-05-13 DIAGNOSIS — E119 Type 2 diabetes mellitus without complications: Secondary | ICD-10-CM | POA: Diagnosis not present

## 2015-05-13 DIAGNOSIS — D509 Iron deficiency anemia, unspecified: Secondary | ICD-10-CM | POA: Diagnosis not present

## 2015-05-16 DIAGNOSIS — E119 Type 2 diabetes mellitus without complications: Secondary | ICD-10-CM | POA: Diagnosis not present

## 2015-05-16 DIAGNOSIS — N2581 Secondary hyperparathyroidism of renal origin: Secondary | ICD-10-CM | POA: Diagnosis not present

## 2015-05-16 DIAGNOSIS — D631 Anemia in chronic kidney disease: Secondary | ICD-10-CM | POA: Diagnosis not present

## 2015-05-16 DIAGNOSIS — D509 Iron deficiency anemia, unspecified: Secondary | ICD-10-CM | POA: Diagnosis not present

## 2015-05-16 DIAGNOSIS — N186 End stage renal disease: Secondary | ICD-10-CM | POA: Diagnosis not present

## 2015-05-17 DIAGNOSIS — I6990 Unspecified sequelae of unspecified cerebrovascular disease: Secondary | ICD-10-CM | POA: Diagnosis not present

## 2015-05-17 DIAGNOSIS — E7800 Pure hypercholesterolemia, unspecified: Secondary | ICD-10-CM | POA: Diagnosis not present

## 2015-05-17 DIAGNOSIS — E11319 Type 2 diabetes mellitus with unspecified diabetic retinopathy without macular edema: Secondary | ICD-10-CM | POA: Diagnosis not present

## 2015-05-17 DIAGNOSIS — I2510 Atherosclerotic heart disease of native coronary artery without angina pectoris: Secondary | ICD-10-CM | POA: Diagnosis not present

## 2015-05-17 DIAGNOSIS — E1165 Type 2 diabetes mellitus with hyperglycemia: Secondary | ICD-10-CM | POA: Diagnosis not present

## 2015-05-17 DIAGNOSIS — E113599 Type 2 diabetes mellitus with proliferative diabetic retinopathy without macular edema, unspecified eye: Secondary | ICD-10-CM | POA: Diagnosis not present

## 2015-05-17 DIAGNOSIS — Z79899 Other long term (current) drug therapy: Secondary | ICD-10-CM | POA: Diagnosis not present

## 2015-05-17 DIAGNOSIS — I119 Hypertensive heart disease without heart failure: Secondary | ICD-10-CM | POA: Diagnosis not present

## 2015-05-17 DIAGNOSIS — H548 Legal blindness, as defined in USA: Secondary | ICD-10-CM | POA: Diagnosis not present

## 2015-05-17 DIAGNOSIS — E559 Vitamin D deficiency, unspecified: Secondary | ICD-10-CM | POA: Diagnosis not present

## 2015-05-18 DIAGNOSIS — D509 Iron deficiency anemia, unspecified: Secondary | ICD-10-CM | POA: Diagnosis not present

## 2015-05-18 DIAGNOSIS — D631 Anemia in chronic kidney disease: Secondary | ICD-10-CM | POA: Diagnosis not present

## 2015-05-18 DIAGNOSIS — N2581 Secondary hyperparathyroidism of renal origin: Secondary | ICD-10-CM | POA: Diagnosis not present

## 2015-05-18 DIAGNOSIS — E119 Type 2 diabetes mellitus without complications: Secondary | ICD-10-CM | POA: Diagnosis not present

## 2015-05-18 DIAGNOSIS — N186 End stage renal disease: Secondary | ICD-10-CM | POA: Diagnosis not present

## 2015-05-20 DIAGNOSIS — D509 Iron deficiency anemia, unspecified: Secondary | ICD-10-CM | POA: Diagnosis not present

## 2015-05-20 DIAGNOSIS — N186 End stage renal disease: Secondary | ICD-10-CM | POA: Diagnosis not present

## 2015-05-20 DIAGNOSIS — E119 Type 2 diabetes mellitus without complications: Secondary | ICD-10-CM | POA: Diagnosis not present

## 2015-05-20 DIAGNOSIS — N2581 Secondary hyperparathyroidism of renal origin: Secondary | ICD-10-CM | POA: Diagnosis not present

## 2015-05-20 DIAGNOSIS — D631 Anemia in chronic kidney disease: Secondary | ICD-10-CM | POA: Diagnosis not present

## 2015-05-23 DIAGNOSIS — N186 End stage renal disease: Secondary | ICD-10-CM | POA: Diagnosis not present

## 2015-05-23 DIAGNOSIS — D631 Anemia in chronic kidney disease: Secondary | ICD-10-CM | POA: Diagnosis not present

## 2015-05-23 DIAGNOSIS — N2581 Secondary hyperparathyroidism of renal origin: Secondary | ICD-10-CM | POA: Diagnosis not present

## 2015-05-23 DIAGNOSIS — E119 Type 2 diabetes mellitus without complications: Secondary | ICD-10-CM | POA: Diagnosis not present

## 2015-05-23 DIAGNOSIS — D509 Iron deficiency anemia, unspecified: Secondary | ICD-10-CM | POA: Diagnosis not present

## 2015-05-25 DIAGNOSIS — D509 Iron deficiency anemia, unspecified: Secondary | ICD-10-CM | POA: Diagnosis not present

## 2015-05-25 DIAGNOSIS — E119 Type 2 diabetes mellitus without complications: Secondary | ICD-10-CM | POA: Diagnosis not present

## 2015-05-25 DIAGNOSIS — N186 End stage renal disease: Secondary | ICD-10-CM | POA: Diagnosis not present

## 2015-05-25 DIAGNOSIS — D631 Anemia in chronic kidney disease: Secondary | ICD-10-CM | POA: Diagnosis not present

## 2015-05-25 DIAGNOSIS — N2581 Secondary hyperparathyroidism of renal origin: Secondary | ICD-10-CM | POA: Diagnosis not present

## 2015-05-26 ENCOUNTER — Ambulatory Visit: Payer: Medicare Other | Admitting: Podiatry

## 2015-05-27 DIAGNOSIS — N2581 Secondary hyperparathyroidism of renal origin: Secondary | ICD-10-CM | POA: Diagnosis not present

## 2015-05-27 DIAGNOSIS — D509 Iron deficiency anemia, unspecified: Secondary | ICD-10-CM | POA: Diagnosis not present

## 2015-05-27 DIAGNOSIS — D631 Anemia in chronic kidney disease: Secondary | ICD-10-CM | POA: Diagnosis not present

## 2015-05-27 DIAGNOSIS — N186 End stage renal disease: Secondary | ICD-10-CM | POA: Diagnosis not present

## 2015-05-27 DIAGNOSIS — E119 Type 2 diabetes mellitus without complications: Secondary | ICD-10-CM | POA: Diagnosis not present

## 2015-05-30 DIAGNOSIS — N2581 Secondary hyperparathyroidism of renal origin: Secondary | ICD-10-CM | POA: Diagnosis not present

## 2015-05-30 DIAGNOSIS — E119 Type 2 diabetes mellitus without complications: Secondary | ICD-10-CM | POA: Diagnosis not present

## 2015-05-30 DIAGNOSIS — D509 Iron deficiency anemia, unspecified: Secondary | ICD-10-CM | POA: Diagnosis not present

## 2015-05-30 DIAGNOSIS — N186 End stage renal disease: Secondary | ICD-10-CM | POA: Diagnosis not present

## 2015-05-30 DIAGNOSIS — D631 Anemia in chronic kidney disease: Secondary | ICD-10-CM | POA: Diagnosis not present

## 2015-06-01 DIAGNOSIS — D631 Anemia in chronic kidney disease: Secondary | ICD-10-CM | POA: Diagnosis not present

## 2015-06-01 DIAGNOSIS — N186 End stage renal disease: Secondary | ICD-10-CM | POA: Diagnosis not present

## 2015-06-01 DIAGNOSIS — N2581 Secondary hyperparathyroidism of renal origin: Secondary | ICD-10-CM | POA: Diagnosis not present

## 2015-06-01 DIAGNOSIS — E119 Type 2 diabetes mellitus without complications: Secondary | ICD-10-CM | POA: Diagnosis not present

## 2015-06-01 DIAGNOSIS — D509 Iron deficiency anemia, unspecified: Secondary | ICD-10-CM | POA: Diagnosis not present

## 2015-06-04 DIAGNOSIS — E119 Type 2 diabetes mellitus without complications: Secondary | ICD-10-CM | POA: Diagnosis not present

## 2015-06-04 DIAGNOSIS — D631 Anemia in chronic kidney disease: Secondary | ICD-10-CM | POA: Diagnosis not present

## 2015-06-04 DIAGNOSIS — D509 Iron deficiency anemia, unspecified: Secondary | ICD-10-CM | POA: Diagnosis not present

## 2015-06-04 DIAGNOSIS — N186 End stage renal disease: Secondary | ICD-10-CM | POA: Diagnosis not present

## 2015-06-04 DIAGNOSIS — N2581 Secondary hyperparathyroidism of renal origin: Secondary | ICD-10-CM | POA: Diagnosis not present

## 2015-06-06 DIAGNOSIS — N186 End stage renal disease: Secondary | ICD-10-CM | POA: Diagnosis not present

## 2015-06-06 DIAGNOSIS — D509 Iron deficiency anemia, unspecified: Secondary | ICD-10-CM | POA: Diagnosis not present

## 2015-06-06 DIAGNOSIS — N2581 Secondary hyperparathyroidism of renal origin: Secondary | ICD-10-CM | POA: Diagnosis not present

## 2015-06-06 DIAGNOSIS — D631 Anemia in chronic kidney disease: Secondary | ICD-10-CM | POA: Diagnosis not present

## 2015-06-06 DIAGNOSIS — E119 Type 2 diabetes mellitus without complications: Secondary | ICD-10-CM | POA: Diagnosis not present

## 2015-06-07 ENCOUNTER — Encounter: Payer: Self-pay | Admitting: Vascular Surgery

## 2015-06-07 ENCOUNTER — Ambulatory Visit: Payer: Medicare Other | Attending: Vascular Surgery | Admitting: Occupational Therapy

## 2015-06-07 DIAGNOSIS — M79622 Pain in left upper arm: Secondary | ICD-10-CM | POA: Insufficient documentation

## 2015-06-07 DIAGNOSIS — M25522 Pain in left elbow: Secondary | ICD-10-CM

## 2015-06-07 DIAGNOSIS — M25612 Stiffness of left shoulder, not elsewhere classified: Secondary | ICD-10-CM | POA: Insufficient documentation

## 2015-06-07 DIAGNOSIS — M6289 Other specified disorders of muscle: Secondary | ICD-10-CM | POA: Diagnosis not present

## 2015-06-07 DIAGNOSIS — R29898 Other symptoms and signs involving the musculoskeletal system: Secondary | ICD-10-CM | POA: Insufficient documentation

## 2015-06-08 DIAGNOSIS — N2581 Secondary hyperparathyroidism of renal origin: Secondary | ICD-10-CM | POA: Diagnosis not present

## 2015-06-08 DIAGNOSIS — D631 Anemia in chronic kidney disease: Secondary | ICD-10-CM | POA: Diagnosis not present

## 2015-06-08 DIAGNOSIS — N186 End stage renal disease: Secondary | ICD-10-CM | POA: Diagnosis not present

## 2015-06-08 DIAGNOSIS — E119 Type 2 diabetes mellitus without complications: Secondary | ICD-10-CM | POA: Diagnosis not present

## 2015-06-08 DIAGNOSIS — I120 Hypertensive chronic kidney disease with stage 5 chronic kidney disease or end stage renal disease: Secondary | ICD-10-CM | POA: Diagnosis not present

## 2015-06-08 DIAGNOSIS — D509 Iron deficiency anemia, unspecified: Secondary | ICD-10-CM | POA: Diagnosis not present

## 2015-06-08 DIAGNOSIS — Z992 Dependence on renal dialysis: Secondary | ICD-10-CM | POA: Diagnosis not present

## 2015-06-08 NOTE — Therapy (Signed)
[facility] [address] [place], [place], [address] Phone: [phone]   Fax:  [phone]  Occupational Therapy Evaluation  Patient Details  Name: Shawn [name] MRN: [id number] Date of Birth: [birth year] Referring Provider: Dr. Bridgett Larsson  Encounter Date: 06/07/2015      OT End of Session - 06/08/15 0843    Visit Number 1   Number of Visits 9   Authorization Type medicare   OT Start Time 0857   OT Stop Time 0924   OT Time Calculation (min) 27 min   Activity Tolerance Patient tolerated treatment well   Behavior During Therapy [facility] for tasks assessed/performed      Past Medical History  Diagnosis Date  . Hypertension   . No pertinent past medical history   . Blind right eye   . Legally blind   . Anemia   . Complication of anesthesia   . PONV (postoperative nausea and vomiting)     N/V- became dehydrated had to have IV fluids- 01/2015  . Stroke [facility]) 2012    date per patient  . 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 Shortness of breath dyspnea     when has too fluid  . Diabetes mellitus     type II  . Chronic kidney disease     Dialysis since 2011 MWF  . Arthritis     patient denies    Past Surgical History  Procedure Laterality Date  . Left arm graft  10/2010  . Insertion of dialysis catheter Right   . Eye surgery Bilateral     retina surgery both eyes, cataract surgery both eyes  . Colonoscopy  06/2012  . Av fistula placement Right 05/12/2013    Procedure: INSERTION OF ARTERIOVENOUS (AV) GORE-TEX GRAFT ARM; ULTRASOUND GUIDED;  Surgeon: Conrad [place], MD;  Location: [facility] OR;  Service: Vascular;  Laterality: Right;  . Hernia repair      right inguinal  . Av fistula placement Left 01/18/2015    Procedure: INSERTION OF ARTERIOVENOUS GORE-TEX GRAFT LEFT UPPER ARM;  Surgeon: Conrad [place], MD;  Location: [facility];  Service: Vascular;  Laterality: Left;  . Ligation  arteriovenous gortex graft Left 05/03/2015    Procedure: LIGATION ARTERIOVENOUS GORTEX GRAFT-LEFT UPPER ARM;  Surgeon: Conrad [place], MD;  Location: [facility];  Service: Vascular;  Laterality: Left;    There were no vitals filed for this visit.  Visit Diagnosis:  Weakness of left hand - Plan: Ot plan of care cert/re-cert  Weakness of left arm - Plan: Ot plan of care cert/re-cert  Stiffness of joint, shoulder region, left - Plan: Ot plan of care cert/re-cert  Pain in joint, upper arm, left - Plan: Ot plan of care cert/re-cert      Subjective Assessment - 06/07/15 0859    Subjective  Pt reports weakness in LUE after placement of dialysis graft   Limitations see Epic   Patient Stated Goals strength in left arm   Currently in Pain? Yes   Pain Score 6    Pain Location Arm   Pain Orientation Left   Pain Descriptors / Indicators Aching   Pain Type Chronic pain   Pain Onset More than a month ago   Aggravating Factors  movement   Pain Relieving Factors inactivity   Multiple Pain Sites No           [facility] OT Assessment - 06/08/15 0001    Assessment   Diagnosis steal syndrome LUE  Referring Provider Dr. Bridgett Larsson   Onset Date --  August 2016   Assessment Pt s/p placement of dialysis graft in LUE in July   Prior Therapy previous PT/OT s/p CVA   Precautions   Precautions Fall, dialysis, porta cath   Precaution Comments visual impairment   Balance Screen   Has the patient fallen in the past 6 months No   Has the patient had a decrease in activity level because of a fear of falling?  No   Is the patient reluctant to leave their home because of a fear of falling?  No   Home  Environment   Family/patient expects to be discharged to: Private residence   Living Arrangements Spouse/significant other   Available Help at Discharge Family   Type of [place] One level   Bathroom Shower/Tub Tub/Shower unit   Prior Function   Level of [place] with basic ADLs   Vocation On disability   ADL   Eating/Feeding Modified independent   Grooming Modified independent   Upper Body Bathing Modified independent   Upper Body Dressing Independent   Lower Body Dressing Modified independent   Toilet Tranfer Modified independent   Tub/Shower Transfer Details (indicate cue type and reason --  Pt sponge bathes due to porta cath   ADL comments Pt is modified independent with basic ADLS.   IADL   Meal Prep Able to complete simple warm meal prep  Pt uses electric skillet   Written Expression   Dominant Hand Right   Vision - History   Visual History Macular degeneration  diabetic retinopathy   Vision Assessment   Per MD/OD Report OD NLP,    Comment Pt does not use stove, electric skillet only.   Coordination   Box and Blocks RUE 29 blocks, LUE 34 blocks   ROM / Strength   AROM / PROM / Strength AROM;Strength   AROM   Overall AROM  Deficits   Overall AROM Comments LUE shoulder flexion 110, LUE shoulder abduct 90,    Strength   Overall Strength Deficits   Overall Strength Comments RUE 4+/%, LUE distal 4+/%, prox 4/5   Hand Function   Right Hand Gross Grasp Functional   Right Hand Grip (lbs) 64   Left Hand Gross Grasp Impaired  compared to RUE   Left Hand Grip (lbs) 48 lbs                           OT Short Term Goals - 06/08/15 [id number]    OT SHORT TERM GOAL #1   Title I with HEP.   Time 4   Period Weeks   Status New   OT SHORT TERM GOAL #2   Title Pt will increase LUE grip strength to 55  lbs for increased functional use   Time 5   Period Weeks   Status New           OT Long Term Goals - 06/08/15 0830    OT LONG TERM GOAL #1   Title Pt will demonstrate shoulder flexion to 120 to retrieve a lightweight object with LUE, pain less than or equal to 3/10.   Time 8   Period Weeks   Status New   OT LONG TERM GOAL #2   Title Pt will report aht he is using LUE as a non dominant assist at least  75% of the time with  pain less than or equal to 3/10.   Time 8   Period Weeks   Status New               Plan - 06/08/15 [id number]    Clinical Impression Statement Pt s/p placement of dialysis graft in July 2015 developed steal syndrome in LUE with subsequent pain and weakness. pt can benefit from skilled occupational theapy to maximize LUE strength and functional use for ADLs.   Rehab Potential Fair   Clinical Impairments Affecting Rehab Potential low vision, balance   OT Frequency 1x / week   OT Duration 8 weeks  plus eval   OT Treatment/Interventions Self-care/ADL training;Patient/family education;Therapeutic activities;Visual/perceptual remediation/compensation   Plan issue initial HEP for LUE strength, and functional use(cane vs theraband)   Consulted and Agree with Plan of Care Patient          G-Codes - 2015-06-29 1637    Functional Assessment Tool Used Grip strength RUE 64 lbs, LUE 48 lbs , pain in right shoulder/ arm 5-6/10, not using LUE consistently due to pain   Functional Limitation Carrying, moving and handling objects   Carrying, Moving and Handling Objects Current Status [id number]) At least 20 percent but less than 40 percent impaired, limited or restricted   Carrying, Moving and Handling Objects Goal Status [id number]) At least 1 percent but less than 20 percent impaired, limited or restricted      Problem List Patient Active Problem List   Diagnosis Date Noted  . Intracranial atherosclerosis 08/10/2014  . End stage renal disease ([place]) 06/12/2013  . ESRD on dialysis ([place]) 04/17/2013  . Cerebral artery occlusion with cerebral infarction ([place]) 04/02/2013  . Dizziness and giddiness 04/02/2013    Giliana Vantil 06/08/2015, 8:46 AM [name], OTR/L [phone] Phone: [phone] 8:46 AM 06/08/2015 [facility] [address] [place], [place], [address] Phone: [phone]   Fax:   [phone]  Name: Javari Nordine MRN: [id number] Date of Birth: 1960-09-14

## 2015-06-10 ENCOUNTER — Encounter: Payer: Medicare Other | Admitting: Vascular Surgery

## 2015-06-10 DIAGNOSIS — N2581 Secondary hyperparathyroidism of renal origin: Secondary | ICD-10-CM | POA: Diagnosis not present

## 2015-06-10 DIAGNOSIS — E119 Type 2 diabetes mellitus without complications: Secondary | ICD-10-CM | POA: Diagnosis not present

## 2015-06-10 DIAGNOSIS — D509 Iron deficiency anemia, unspecified: Secondary | ICD-10-CM | POA: Diagnosis not present

## 2015-06-10 DIAGNOSIS — N186 End stage renal disease: Secondary | ICD-10-CM | POA: Diagnosis not present

## 2015-06-10 DIAGNOSIS — D631 Anemia in chronic kidney disease: Secondary | ICD-10-CM | POA: Diagnosis not present

## 2015-06-13 DIAGNOSIS — D631 Anemia in chronic kidney disease: Secondary | ICD-10-CM | POA: Diagnosis not present

## 2015-06-13 DIAGNOSIS — N186 End stage renal disease: Secondary | ICD-10-CM | POA: Diagnosis not present

## 2015-06-13 DIAGNOSIS — D509 Iron deficiency anemia, unspecified: Secondary | ICD-10-CM | POA: Diagnosis not present

## 2015-06-13 DIAGNOSIS — N2581 Secondary hyperparathyroidism of renal origin: Secondary | ICD-10-CM | POA: Diagnosis not present

## 2015-06-13 DIAGNOSIS — E119 Type 2 diabetes mellitus without complications: Secondary | ICD-10-CM | POA: Diagnosis not present

## 2015-06-15 DIAGNOSIS — D509 Iron deficiency anemia, unspecified: Secondary | ICD-10-CM | POA: Diagnosis not present

## 2015-06-15 DIAGNOSIS — E119 Type 2 diabetes mellitus without complications: Secondary | ICD-10-CM | POA: Diagnosis not present

## 2015-06-15 DIAGNOSIS — N186 End stage renal disease: Secondary | ICD-10-CM | POA: Diagnosis not present

## 2015-06-15 DIAGNOSIS — N2581 Secondary hyperparathyroidism of renal origin: Secondary | ICD-10-CM | POA: Diagnosis not present

## 2015-06-15 DIAGNOSIS — D631 Anemia in chronic kidney disease: Secondary | ICD-10-CM | POA: Diagnosis not present

## 2015-06-16 ENCOUNTER — Ambulatory Visit (INDEPENDENT_AMBULATORY_CARE_PROVIDER_SITE_OTHER): Payer: Medicare Other | Admitting: Podiatry

## 2015-06-16 ENCOUNTER — Encounter: Payer: Self-pay | Admitting: Podiatry

## 2015-06-16 DIAGNOSIS — M79673 Pain in unspecified foot: Secondary | ICD-10-CM | POA: Diagnosis not present

## 2015-06-16 DIAGNOSIS — B351 Tinea unguium: Secondary | ICD-10-CM | POA: Diagnosis not present

## 2015-06-16 DIAGNOSIS — E1140 Type 2 diabetes mellitus with diabetic neuropathy, unspecified: Secondary | ICD-10-CM

## 2015-06-16 NOTE — Progress Notes (Signed)
Patient ID: Shawn [name], male   DOB: 18-Nov-1960, 54 y.o.   MRN: [id number] Complaint:  Visit Type: Patient returns to my office for continued preventative foot care services. Complaint: Patient states" my nails have grown long and thick and become painful to walk and wear shoes" Patient has been diagnosed with DM with neuropathy and angiopathy.. The patient presents for preventative foot care services. No changes to ROS  Podiatric Exam: Vascular: dorsalis pedis and posterior tibial pulses are palpable bilateral. Capillary return is immediate. Temperature gradient is WNL. Skin turgor WNL  Sensorium: Diminished Semmes Weinstein monofilament test. Normal tactile sensation bilaterally. Nail Exam: Pt has thick disfigured discolored nails with subungual debris noted bilateral entire nail hallux through fifth toenails Ulcer Exam: There is no evidence of ulcer or pre-ulcerative changes or infection. Orthopedic Exam: Muscle tone and strength are WNL. No limitations in general ROM. No crepitus or effusions noted. Foot type and digits show no abnormalities. Bony prominences are unremarkable. Skin: No Porokeratosis. No infection or ulcers.  Dried blister covering that is peeling on left hallux.  No signs of redness or infection or swelling.  Diagnosis:  Onychomycosis, , Pain in right toe, pain in left toes  Treatment & Plan Procedures and Treatment: Consent by patient was obtained for treatment procedures. The patient understood the discussion of treatment and procedures well. All questions were answered thoroughly reviewed. Debridement of mycotic and hypertrophic toenails, 1 through 5 bilateral and clearing of subungual debris. No ulceration, no infection noted.  Return Visit-Office Procedure: Patient instructed to return to the office for a follow up visit 3 months for continued evaluation and treatment.  Gardiner Barefoot DPM

## 2015-06-17 ENCOUNTER — Encounter: Payer: Self-pay | Admitting: Vascular Surgery

## 2015-06-17 DIAGNOSIS — N186 End stage renal disease: Secondary | ICD-10-CM | POA: Diagnosis not present

## 2015-06-17 DIAGNOSIS — N2581 Secondary hyperparathyroidism of renal origin: Secondary | ICD-10-CM | POA: Diagnosis not present

## 2015-06-17 DIAGNOSIS — E119 Type 2 diabetes mellitus without complications: Secondary | ICD-10-CM | POA: Diagnosis not present

## 2015-06-17 DIAGNOSIS — D509 Iron deficiency anemia, unspecified: Secondary | ICD-10-CM | POA: Diagnosis not present

## 2015-06-17 DIAGNOSIS — D631 Anemia in chronic kidney disease: Secondary | ICD-10-CM | POA: Diagnosis not present

## 2015-06-20 DIAGNOSIS — N2581 Secondary hyperparathyroidism of renal origin: Secondary | ICD-10-CM | POA: Diagnosis not present

## 2015-06-20 DIAGNOSIS — N186 End stage renal disease: Secondary | ICD-10-CM | POA: Diagnosis not present

## 2015-06-20 DIAGNOSIS — E119 Type 2 diabetes mellitus without complications: Secondary | ICD-10-CM | POA: Diagnosis not present

## 2015-06-20 DIAGNOSIS — D631 Anemia in chronic kidney disease: Secondary | ICD-10-CM | POA: Diagnosis not present

## 2015-06-20 DIAGNOSIS — D509 Iron deficiency anemia, unspecified: Secondary | ICD-10-CM | POA: Diagnosis not present

## 2015-06-21 ENCOUNTER — Ambulatory Visit: Payer: Medicare Other | Attending: Vascular Surgery | Admitting: Occupational Therapy

## 2015-06-21 DIAGNOSIS — M79622 Pain in left upper arm: Secondary | ICD-10-CM | POA: Insufficient documentation

## 2015-06-21 DIAGNOSIS — M25612 Stiffness of left shoulder, not elsewhere classified: Secondary | ICD-10-CM | POA: Insufficient documentation

## 2015-06-21 DIAGNOSIS — M25522 Pain in left elbow: Secondary | ICD-10-CM

## 2015-06-21 DIAGNOSIS — R29898 Other symptoms and signs involving the musculoskeletal system: Secondary | ICD-10-CM | POA: Insufficient documentation

## 2015-06-21 DIAGNOSIS — M6289 Other specified disorders of muscle: Secondary | ICD-10-CM | POA: Diagnosis not present

## 2015-06-21 NOTE — Therapy (Signed)
[facility] [address] [place], [place], [address] Phone: [phone]   Fax:  [phone]  Occupational Therapy Treatment  Patient Details  Name: Shawn [name] MRN: [id number] Date of Birth: [birth year] Referring Provider: Dr. Bridgett Larsson  Encounter Date: 06/21/2015      OT End of Session - 06/21/15 0947    Visit Number 2   Number of Visits 9   Authorization Type medicare   OT Start Time 0936   OT Stop Time 1015   OT Time Calculation (min) 39 min   Activity Tolerance Patient tolerated treatment well   Behavior During Therapy [facility] for tasks assessed/performed      Past Medical History  Diagnosis Date  . Hypertension   . No pertinent past medical history   . Blind right eye   . Legally blind   . Anemia   . Complication of anesthesia   . PONV (postoperative nausea and vomiting)     N/V- became dehydrated had to have IV fluids- 01/2015  . Stroke [facility]) 2012    date per patient  . 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 Shortness of breath dyspnea     when has too fluid  . Diabetes mellitus     type II  . Chronic kidney disease     Dialysis since 2011 MWF  . Arthritis     patient denies    Past Surgical History  Procedure Laterality Date  . Left arm graft  10/2010  . Insertion of dialysis catheter Right   . Eye surgery Bilateral     retina surgery both eyes, cataract surgery both eyes  . Colonoscopy  06/2012  . Av fistula placement Right 05/12/2013    Procedure: INSERTION OF ARTERIOVENOUS (AV) GORE-TEX GRAFT ARM; ULTRASOUND GUIDED;  Surgeon: Conrad Belleair [place], MD;  Location: [facility] OR;  Service: Vascular;  Laterality: Right;  . Hernia repair      right inguinal  . Av fistula placement Left 01/18/2015    Procedure: INSERTION OF ARTERIOVENOUS GORE-TEX GRAFT LEFT UPPER ARM;  Surgeon: Conrad Coats, MD;  Location: [place];  Service: Vascular;  Laterality: Left;  . Ligation  arteriovenous gortex graft Left 05/03/2015    Procedure: LIGATION ARTERIOVENOUS GORTEX GRAFT-LEFT UPPER ARM;  Surgeon: Conrad [place]ac[place], MD;  Location: [place];  Service: Vascular;  Laterality: Left;    There were no vitals filed for this visit.  Visit Diagnosis:  Weakness of left arm  Weakness of left hand  Stiffness of joint, shoulder region, left  Pain in joint, upper arm, left      Subjective Assessment - 06/21/15 0944    Subjective  Pt reports left shoulder pain   Limitations see Epic   Patient Stated Goals strength in left arm   Currently in Pain? Yes   Pain Score 6    Pain Location Shoulder   Pain Orientation Left   Pain Descriptors / Indicators Aching   Pain Type Chronic pain   Pain Onset More than a month ago   Aggravating Factors  movement   Pain Relieving Factors inactivity   Multiple Pain Sites No        Treatment: Red theraputty exercises for grip and pinch, while hotpack on left shoulder for pain relief x 10 mins, no adverse reactions.. Pt was educated in HEP for cane exercises for shoulder flexion/ chest press, and shoulder abduction 10 reps each, min v.c Arm bike x 6 mins level 3 for conditioning.  OT Education - 06/21/15 1015    Education provided Yes   Education Details red putty, cane ex   Person(s) Educated Patient   Methods Explanation;Demonstration;Verbal cues   Comprehension Verbalized understanding;Returned demonstration          OT Short Term Goals - 06/08/15 0829    OT SHORT TERM GOAL #1   Title I with HEP.   Time 4   Period Weeks   Status New   OT SHORT TERM GOAL #2   Title Pt will increase LUE grip strength to 55  lbs for increased functional use   Time 5   Period Weeks   Status New           OT Long Term Goals - 06/08/15 0830    OT LONG TERM GOAL #1   Title Pt will demonstrate shoulder flexion to 120 to retrieve a lightweight object with LUE, pain less than or equal to 3/10.   Time 8    Period Weeks   Status New   OT LONG TERM GOAL #2   Title Pt will report aht he is using LUE as a non dominant assist at least 75% of the time with pain less than or equal to 3/10.   Time 8   Period Weeks   Status New               Plan - 06/21/15 0945    Clinical Impression Statement Pt is progressing towards goals for LUE functional use and strength.   Rehab Potential Fair   Clinical Impairments Affecting Rehab Potential low vision, balance   OT Frequency 1x / week   OT Duration 8 weeks   OT Treatment/Interventions Self-care/ADL training;Patient/family education;Therapeutic activities;Visual/perceptual remediation/compensation   Plan progress HEP   Consulted and Agree with Plan of Care Patient        Problem List Patient Active Problem List   Diagnosis Date Noted  . Intracranial atherosclerosis 08/10/2014  . End stage renal disease ([place]) 06/12/2013  . ESRD on dialysis ([place]) 04/17/2013  . Cerebral artery occlusion with cerebral infarction ([place]) 04/02/2013  . Dizziness and giddiness 04/02/2013    RINE,KATHRYN 06/21/2015, 4:19 PM [name], OTR/L [phone] Phone: [phone] 4:19 PM 06/21/2015 [place] [address] [place], [place], [address] Phone: [phone]   Fax:  [phone]  Name: Shawn [name] MRN: [id number] Date of Birth: 04-21-1961

## 2015-06-22 DIAGNOSIS — D509 Iron deficiency anemia, unspecified: Secondary | ICD-10-CM | POA: Diagnosis not present

## 2015-06-22 DIAGNOSIS — N186 End stage renal disease: Secondary | ICD-10-CM | POA: Diagnosis not present

## 2015-06-22 DIAGNOSIS — D631 Anemia in chronic kidney disease: Secondary | ICD-10-CM | POA: Diagnosis not present

## 2015-06-22 DIAGNOSIS — N2581 Secondary hyperparathyroidism of renal origin: Secondary | ICD-10-CM | POA: Diagnosis not present

## 2015-06-22 DIAGNOSIS — E119 Type 2 diabetes mellitus without complications: Secondary | ICD-10-CM | POA: Diagnosis not present

## 2015-06-23 ENCOUNTER — Encounter: Payer: Medicare Other | Admitting: Occupational Therapy

## 2015-06-23 DIAGNOSIS — N186 End stage renal disease: Secondary | ICD-10-CM | POA: Diagnosis not present

## 2015-06-23 DIAGNOSIS — D509 Iron deficiency anemia, unspecified: Secondary | ICD-10-CM | POA: Diagnosis not present

## 2015-06-23 DIAGNOSIS — D631 Anemia in chronic kidney disease: Secondary | ICD-10-CM | POA: Diagnosis not present

## 2015-06-23 DIAGNOSIS — N2581 Secondary hyperparathyroidism of renal origin: Secondary | ICD-10-CM | POA: Diagnosis not present

## 2015-06-23 DIAGNOSIS — E119 Type 2 diabetes mellitus without complications: Secondary | ICD-10-CM | POA: Diagnosis not present

## 2015-06-24 ENCOUNTER — Encounter: Payer: Medicare Other | Admitting: Vascular Surgery

## 2015-06-27 DIAGNOSIS — E119 Type 2 diabetes mellitus without complications: Secondary | ICD-10-CM | POA: Diagnosis not present

## 2015-06-27 DIAGNOSIS — D509 Iron deficiency anemia, unspecified: Secondary | ICD-10-CM | POA: Diagnosis not present

## 2015-06-27 DIAGNOSIS — D631 Anemia in chronic kidney disease: Secondary | ICD-10-CM | POA: Diagnosis not present

## 2015-06-27 DIAGNOSIS — N186 End stage renal disease: Secondary | ICD-10-CM | POA: Diagnosis not present

## 2015-06-27 DIAGNOSIS — N2581 Secondary hyperparathyroidism of renal origin: Secondary | ICD-10-CM | POA: Diagnosis not present

## 2015-06-28 ENCOUNTER — Ambulatory Visit: Payer: Medicare Other | Admitting: Occupational Therapy

## 2015-06-28 DIAGNOSIS — M6289 Other specified disorders of muscle: Secondary | ICD-10-CM | POA: Diagnosis not present

## 2015-06-28 DIAGNOSIS — M25612 Stiffness of left shoulder, not elsewhere classified: Secondary | ICD-10-CM | POA: Diagnosis not present

## 2015-06-28 DIAGNOSIS — R29898 Other symptoms and signs involving the musculoskeletal system: Secondary | ICD-10-CM | POA: Diagnosis not present

## 2015-06-28 DIAGNOSIS — M79622 Pain in left upper arm: Secondary | ICD-10-CM | POA: Diagnosis not present

## 2015-06-28 DIAGNOSIS — M25522 Pain in left elbow: Secondary | ICD-10-CM

## 2015-06-28 NOTE — Patient Instructions (Signed)
  Resisted Horizontal Abduction: Bilateral    stand, tubing in both hands, arms out in front. Keeping arms straight, pinch shoulder blades together and stretch arms out. Repeat _10___ times per set. Do _1-2___ sessions per day, every other day.   Elbow Flexion: Resisted   With tubing held in _each_____ hand(s) and other end secured under foot, curl arm up as far as possible. Repeat _10___ times per set. Do _1-2___ sessions per day, every other day.    Elbow Extension: Resisted   Sit in chair with resistive band secured at armrest (or hold with other hand) and __each_____ elbow bent. Straighten elbow. Repeat _10___ times per set.  Do _1-2___ sessions per day, every other day.   Copyright  VHI. All rights reserved.

## 2015-06-28 NOTE — Therapy (Signed)
[facility] [address] [place], [place], [address] Phone: [phone]   Fax:  [phone]  Occupational Therapy Treatment  Patient Details  Name: Shawn [name] MRN: [id number] Date of Birth: May 26, 1961 Referring Provider: Dr. Bridgett Larsson  Encounter Date: 06/28/2015      OT End of Session - 06/28/15 0827    Visit Number 3   Number of Visits 9   Authorization Type medicare   OT Start Time 0822   OT Stop Time 0903   OT Time Calculation (min) 41 min   Activity Tolerance Patient tolerated treatment well   Behavior During Therapy [facility] for tasks assessed/performed      Past Medical History  Diagnosis Date  . Hypertension   . No pertinent past medical history   . Blind right eye   . Legally blind   . Anemia   . Complication of anesthesia   . PONV (postoperative nausea and vomiting)     N/V- became dehydrated had to have IV fluids- 01/2015  . Stroke [facility]) 2012    date per patient  . 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 Shortness of breath dyspnea     when has too fluid  . Diabetes mellitus     type II  . Chronic kidney disease     Dialysis since 2011 MWF  . Arthritis     patient denies    Past Surgical History  Procedure Laterality Date  . Left arm graft  10/2010  . Insertion of dialysis catheter Right   . Eye surgery Bilateral     retina surgery both eyes, cataract surgery both eyes  . Colonoscopy  06/2012  . Av fistula placement Right 05/12/2013    Procedure: INSERTION OF ARTERIOVENOUS (AV) GORE-TEX GRAFT ARM; ULTRASOUND GUIDED;  Surgeon: Conrad [place], MD;  Location: [facility] OR;  Service: Vascular;  Laterality: Right;  . Hernia repair      right inguinal  . Av fistula placement Left 01/18/2015    Procedure: INSERTION OF ARTERIOVENOUS GORE-TEX GRAFT LEFT UPPER ARM;  Surgeon: Conrad [place], MD;  Location: [place];  Service: Vascular;  Laterality: Left;  . Ligation  arteriovenous gortex graft Left 05/03/2015    Procedure: LIGATION ARTERIOVENOUS GORTEX GRAFT-LEFT UPPER ARM;  Surgeon: Conrad Morton, MD;  Location: [place];  Service: Vascular;  Laterality: Left;    There were no vitals filed for this visit.  Visit Diagnosis:  Weakness of left arm  Weakness of left hand  Stiffness of joint, shoulder region, left  Pain in joint, upper arm, left      Subjective Assessment - 06/28/15 0823    Subjective  Pt reports pain in his upper arm   Limitations see Epic   Currently in Pain? Yes   Pain Score 2    Pain Location Arm   Pain Orientation Left   Pain Descriptors / Indicators Aching   Pain Type Chronic pain   Pain Onset More than a month ago   Aggravating Factors  movement   Pain Relieving Factors inactivity   Multiple Pain Sites No       Treatment: red theraputty HEP review, 20 reps each,  Reviewed cane exercises 15 reps each, followed by education regarding yellow theraband HEP 10-15 reps each min v.c. and demonstration. Arm bike x 6 mins level 3 for  Conditioning Functional overhead reaching to remove pegs from vertical pegboards at 110-120 shoulder flexion, min v.c. , pt reported fatigue at end  of task.                         OT Short Term Goals - 06/28/15 [id number]    OT SHORT TERM GOAL #1   Title I with HEP.   Time 4   Period Weeks   Status Achieved   OT SHORT TERM GOAL #2   Title Pt will increase LUE grip strength to 55  lbs for increased functional use   Time 5   Period Weeks   Status On-going           OT Long Term Goals - 06/28/15 [id number]    OT LONG TERM GOAL #1   Title Pt will demonstrate shoulder flexion to 120 to retrieve a lightweight object with LUE, pain less than or equal to 3/10.   Time 8   Period Weeks   Status On-going   OT LONG TERM GOAL #2   Title Pt will report aht he is using LUE as a non dominant assist at least 75% of the time with pain less than or equal to 3/10.   Time 8   Period Weeks    Status On-going               Plan - 06/28/15 [id number]    Clinical Impression Statement Pt is progressing towards goals with increasing overall strength and LUE functional use.   Pt will benefit from skilled therapeutic intervention in order to improve on the following deficits (Retired) Decreased coordination;Decreased range of motion;Impaired flexibility;Decreased endurance;Impaired sensation;Decreased activity tolerance;Pain;Impaired UE functional use;Impaired perceived functional ability;Decreased strength;Decreased mobility   Rehab Potential Fair   Clinical Impairments Affecting Rehab Potential low vision, balance   OT Frequency 1x / week   OT Duration 8 weeks   OT Treatment/Interventions Self-care/ADL training;Patient/family education;Therapeutic activities;Visual/perceptual remediation/compensation   Plan address LUE strength and functional use   Consulted and Agree with Plan of Care Patient        Problem List Patient Active Problem List   Diagnosis Date Noted  . Intracranial atherosclerosis 08/10/2014  . End stage renal disease ([place]) 06/12/2013  . ESRD on dialysis ([place]) 04/17/2013  . Cerebral artery occlusion with cerebral infarction ([place]) 04/02/2013  . Dizziness and giddiness 04/02/2013    RINE,KATHRYN 06/28/2015, 9:19 AM [name], OTR/L [phone] Phone: [phone] 9:19 AM 06/28/2015 [facility] [address] [place], [place], [address] Phone: [phone]   Fax:  [phone]  Name: Trevaughn Mullane MRN: [id number] Date of Birth: 11-19-1960

## 2015-06-29 ENCOUNTER — Encounter: Payer: Self-pay | Admitting: Vascular Surgery

## 2015-06-29 DIAGNOSIS — E119 Type 2 diabetes mellitus without complications: Secondary | ICD-10-CM | POA: Diagnosis not present

## 2015-06-29 DIAGNOSIS — D509 Iron deficiency anemia, unspecified: Secondary | ICD-10-CM | POA: Diagnosis not present

## 2015-06-29 DIAGNOSIS — D631 Anemia in chronic kidney disease: Secondary | ICD-10-CM | POA: Diagnosis not present

## 2015-06-29 DIAGNOSIS — N2581 Secondary hyperparathyroidism of renal origin: Secondary | ICD-10-CM | POA: Diagnosis not present

## 2015-06-29 DIAGNOSIS — N186 End stage renal disease: Secondary | ICD-10-CM | POA: Diagnosis not present

## 2015-06-30 ENCOUNTER — Encounter: Payer: Medicare Other | Admitting: Occupational Therapy

## 2015-07-01 DIAGNOSIS — D509 Iron deficiency anemia, unspecified: Secondary | ICD-10-CM | POA: Diagnosis not present

## 2015-07-01 DIAGNOSIS — N2581 Secondary hyperparathyroidism of renal origin: Secondary | ICD-10-CM | POA: Diagnosis not present

## 2015-07-01 DIAGNOSIS — E119 Type 2 diabetes mellitus without complications: Secondary | ICD-10-CM | POA: Diagnosis not present

## 2015-07-01 DIAGNOSIS — D631 Anemia in chronic kidney disease: Secondary | ICD-10-CM | POA: Diagnosis not present

## 2015-07-01 DIAGNOSIS — N186 End stage renal disease: Secondary | ICD-10-CM | POA: Diagnosis not present

## 2015-07-04 DIAGNOSIS — E119 Type 2 diabetes mellitus without complications: Secondary | ICD-10-CM | POA: Diagnosis not present

## 2015-07-04 DIAGNOSIS — D509 Iron deficiency anemia, unspecified: Secondary | ICD-10-CM | POA: Diagnosis not present

## 2015-07-04 DIAGNOSIS — N186 End stage renal disease: Secondary | ICD-10-CM | POA: Diagnosis not present

## 2015-07-04 DIAGNOSIS — N2581 Secondary hyperparathyroidism of renal origin: Secondary | ICD-10-CM | POA: Diagnosis not present

## 2015-07-04 DIAGNOSIS — D631 Anemia in chronic kidney disease: Secondary | ICD-10-CM | POA: Diagnosis not present

## 2015-07-06 DIAGNOSIS — D631 Anemia in chronic kidney disease: Secondary | ICD-10-CM | POA: Diagnosis not present

## 2015-07-06 DIAGNOSIS — N186 End stage renal disease: Secondary | ICD-10-CM | POA: Diagnosis not present

## 2015-07-06 DIAGNOSIS — E119 Type 2 diabetes mellitus without complications: Secondary | ICD-10-CM | POA: Diagnosis not present

## 2015-07-06 DIAGNOSIS — D509 Iron deficiency anemia, unspecified: Secondary | ICD-10-CM | POA: Diagnosis not present

## 2015-07-06 DIAGNOSIS — N2581 Secondary hyperparathyroidism of renal origin: Secondary | ICD-10-CM | POA: Diagnosis not present

## 2015-07-07 ENCOUNTER — Ambulatory Visit: Payer: Medicare Other | Admitting: Occupational Therapy

## 2015-07-07 DIAGNOSIS — M6289 Other specified disorders of muscle: Secondary | ICD-10-CM | POA: Diagnosis not present

## 2015-07-07 DIAGNOSIS — M25612 Stiffness of left shoulder, not elsewhere classified: Secondary | ICD-10-CM | POA: Diagnosis not present

## 2015-07-07 DIAGNOSIS — M25522 Pain in left elbow: Secondary | ICD-10-CM

## 2015-07-07 DIAGNOSIS — R29898 Other symptoms and signs involving the musculoskeletal system: Secondary | ICD-10-CM

## 2015-07-07 DIAGNOSIS — M79622 Pain in left upper arm: Secondary | ICD-10-CM | POA: Diagnosis not present

## 2015-07-07 NOTE — Therapy (Signed)
[facility] [address] [place], [place], [address] Phone: [phone]   Fax:  [phone]  Occupational Therapy Treatment  Patient Details  Name: Shawn [name] MRN: [id number] Date of Birth: 1960-09-28 Referring Provider: Dr. Bridgett Larsson  Encounter Date: 07/07/2015      OT End of Session - 07/07/15 1151    Visit Number 4   Date for OT Re-Evaluation 08/05/15   Authorization Type medicare   OT Start Time 1045   OT Stop Time 1130   OT Time Calculation (min) 45 min      Past Medical History  Diagnosis Date  . Hypertension   . No pertinent past medical history   . Blind right eye   . Legally blind   . Anemia   . Complication of anesthesia   . PONV (postoperative nausea and vomiting)     N/V- became dehydrated had to have IV fluids- 01/2015  . Stroke [facility]) 2012    date per patient  . 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 Shortness of breath dyspnea     when has too fluid  . Diabetes mellitus     type II  . Chronic kidney disease     Dialysis since 2011 MWF  . Arthritis     patient denies    Past Surgical History  Procedure Laterality Date  . Left arm graft  10/2010  . Insertion of dialysis catheter Right   . Eye surgery Bilateral     retina surgery both eyes, cataract surgery both eyes  . Colonoscopy  06/2012  . Av fistula placement Right 05/12/2013    Procedure: INSERTION OF ARTERIOVENOUS (AV) GORE-TEX GRAFT ARM; ULTRASOUND GUIDED;  Surgeon: Conrad [place], MD;  Location: [facility] OR;  Service: Vascular;  Laterality: Right;  . Hernia repair      right inguinal  . Av fistula placement Left 01/18/2015    Procedure: INSERTION OF ARTERIOVENOUS GORE-TEX GRAFT LEFT UPPER ARM;  Surgeon: Conrad Greenwood, MD;  Location: [place];  Service: Vascular;  Laterality: Left;  . Ligation arteriovenous gortex graft Left 05/03/2015    Procedure: LIGATION ARTERIOVENOUS GORTEX GRAFT-LEFT UPPER ARM;   Surgeon: Conrad Lebam, MD;  Location: [place];  Service: Vascular;  Laterality: Left;    There were no vitals filed for this visit.  Visit Diagnosis:  Weakness of left arm  Weakness of left hand  Stiffness of joint, shoulder region, left  Pain in joint, upper arm, left      Subjective Assessment - 07/07/15 1048    Subjective  Deneis pain   Limitations see Epic   Patient Stated Goals strength in left arm   Currently in Pain? No/denies        Treatment:Reviewed yellow theraband HEP 15 reps each, shoulder flexion with yellow theraband 10 reps each UE, min v.c.  Upgraded putty to green for increased resistance, pt returned demonstration of exercises for grip and pinch. Arm bike x 6 mins level 4 for conditioning.  Functional overhead reaching to place / remove graded clothespins form vertical antennae, min v.c./ facilitation due to vision                        OT Short Term Goals - 07/07/15 1109    OT SHORT TERM GOAL #1   Title I with HEP.   Time 4   Period Weeks   Status Achieved   OT SHORT TERM GOAL #2  Title Pt will increase LUE grip strength to 55  lbs for increased functional use   Baseline 60, 65 lbs   Time 5   Period Weeks   Status Achieved           OT Long Term Goals - 07/07/15 1110    OT LONG TERM GOAL #1   Title Pt will demonstrate shoulder flexion to 120 to retrieve a lightweight object with LUE, pain less than or equal to 3/10.   Baseline met,    Time 8   Period Weeks   Status Achieved   OT LONG TERM GOAL #2   Title Pt will report aht he is using LUE as a non dominant assist at least 75% of the time with pain less than or equal to 3/10.   Time 8   Period Weeks   Status On-going               Plan - 07/07/15 1148    Clinical Impression Statement Pt is progressing towards goals. He met STG for grip strength and LTG  for overhead reaching.   Pt will benefit from skilled therapeutic intervention in order to improve on  the following deficits (Retired) Decreased coordination;Decreased range of motion;Impaired flexibility;Decreased endurance;Impaired sensation;Decreased activity tolerance;Pain;Impaired UE functional use;Impaired perceived functional ability;Decreased strength;Decreased mobility   Rehab Potential Fair   OT Frequency 1x / week   OT Duration 8 weeks   OT Treatment/Interventions Self-care/ADL training;Patient/family education;Therapeutic activities;Visual/perceptual remediation/compensation   Plan upgrade/ add to theraband HEP PRN, check goals and d/c   Consulted and Agree with Plan of Care Patient        Problem List Patient Active Problem List   Diagnosis Date Noted  . Intracranial atherosclerosis 08/10/2014  . End stage renal disease ([place]) 06/12/2013  . ESRD on dialysis ([place]) 04/17/2013  . Cerebral artery occlusion with cerebral infarction ([place]) 04/02/2013  . Dizziness and giddiness 04/02/2013    Shawn [name] 07/07/2015, 11:55 AM Shawn [name], OTR/L [phone] Phone: [phone] 11:55 AM 07/07/2015 [facility] [address] [place], [place], [address] Phone: [phone]   Fax:  [phone]  Name: Shawn [name] MRN: [id number] Date of Birth: [birth year]

## 2015-07-08 ENCOUNTER — Encounter: Payer: Self-pay | Admitting: Vascular Surgery

## 2015-07-08 ENCOUNTER — Ambulatory Visit (INDEPENDENT_AMBULATORY_CARE_PROVIDER_SITE_OTHER): Payer: Self-pay | Admitting: Vascular Surgery

## 2015-07-08 VITALS — BP 111/71 | HR 87 | Temp 98.8°F | Ht 71.0 in | Wt 216.8 lb

## 2015-07-08 DIAGNOSIS — N186 End stage renal disease: Secondary | ICD-10-CM

## 2015-07-08 DIAGNOSIS — D509 Iron deficiency anemia, unspecified: Secondary | ICD-10-CM | POA: Diagnosis not present

## 2015-07-08 DIAGNOSIS — Z992 Dependence on renal dialysis: Secondary | ICD-10-CM

## 2015-07-08 DIAGNOSIS — D631 Anemia in chronic kidney disease: Secondary | ICD-10-CM | POA: Diagnosis not present

## 2015-07-08 DIAGNOSIS — E119 Type 2 diabetes mellitus without complications: Secondary | ICD-10-CM | POA: Diagnosis not present

## 2015-07-08 DIAGNOSIS — N2581 Secondary hyperparathyroidism of renal origin: Secondary | ICD-10-CM | POA: Diagnosis not present

## 2015-07-08 NOTE — Progress Notes (Addendum)
    Postoperative Access Visit   History of Present Illness  Shawn [name] is a 54 y.o. year old male who presents for postoperative follow-up for: ligation of LUA AVG (Date: 05/03/15).  The patient's wounds are healed.  The patient notes no steal symptoms.  The patient is able to complete their activities of daily living.  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  He has had improved hand grip.  For VQI Use Only  PRE-ADM LIVING: Home  AMB STATUS: Ambulatory  Physical Examination Filed Vitals:   07/08/15 1103  BP: 111/71  Pulse: 87  Temp: 98.8 F (37.1 C)    LUE: Incision is healed, skin feels warm, hand grip is 5/5, sensation in digits is intact, no thrill or bruit  Medical Decision Making  Shawn [name] is a 54 y.o. year old male who presents s/p LUA AVG ligation, steal syndrome   Left hand grip is essentially back to baseline.  The patient baseline had bilateral thenar muscle atrophy evident even prior to access procedures.  The patient is not interested any additional permanent access and elects to continue HD via his catheter.    He is aware of the risk of continuing via catheter based hemodialysis including death from sepsis, endocarditis, and bacteremia due to infected tunneled dialysis catheter.  Thank you for allowing [place] to participate in this patient's care.  Adele Barthel, MD [facility] Office: [phone] Pager: [phone]  07/08/2015, 12:26 PM

## 2015-07-09 DIAGNOSIS — I120 Hypertensive chronic kidney disease with stage 5 chronic kidney disease or end stage renal disease: Secondary | ICD-10-CM | POA: Diagnosis not present

## 2015-07-09 DIAGNOSIS — N186 End stage renal disease: Secondary | ICD-10-CM | POA: Diagnosis not present

## 2015-07-09 DIAGNOSIS — Z992 Dependence on renal dialysis: Secondary | ICD-10-CM | POA: Diagnosis not present

## 2015-07-11 DIAGNOSIS — D631 Anemia in chronic kidney disease: Secondary | ICD-10-CM | POA: Diagnosis not present

## 2015-07-11 DIAGNOSIS — D509 Iron deficiency anemia, unspecified: Secondary | ICD-10-CM | POA: Diagnosis not present

## 2015-07-11 DIAGNOSIS — N186 End stage renal disease: Secondary | ICD-10-CM | POA: Diagnosis not present

## 2015-07-11 DIAGNOSIS — N2581 Secondary hyperparathyroidism of renal origin: Secondary | ICD-10-CM | POA: Diagnosis not present

## 2015-07-11 DIAGNOSIS — E119 Type 2 diabetes mellitus without complications: Secondary | ICD-10-CM | POA: Diagnosis not present

## 2015-07-12 ENCOUNTER — Encounter: Payer: Medicare Other | Admitting: Occupational Therapy

## 2015-07-13 DIAGNOSIS — N2581 Secondary hyperparathyroidism of renal origin: Secondary | ICD-10-CM | POA: Diagnosis not present

## 2015-07-13 DIAGNOSIS — E119 Type 2 diabetes mellitus without complications: Secondary | ICD-10-CM | POA: Diagnosis not present

## 2015-07-13 DIAGNOSIS — D509 Iron deficiency anemia, unspecified: Secondary | ICD-10-CM | POA: Diagnosis not present

## 2015-07-13 DIAGNOSIS — D631 Anemia in chronic kidney disease: Secondary | ICD-10-CM | POA: Diagnosis not present

## 2015-07-13 DIAGNOSIS — N186 End stage renal disease: Secondary | ICD-10-CM | POA: Diagnosis not present

## 2015-07-14 ENCOUNTER — Ambulatory Visit: Payer: Medicare Other | Attending: Vascular Surgery | Admitting: Occupational Therapy

## 2015-07-14 DIAGNOSIS — M79622 Pain in left upper arm: Secondary | ICD-10-CM | POA: Insufficient documentation

## 2015-07-14 DIAGNOSIS — M25612 Stiffness of left shoulder, not elsewhere classified: Secondary | ICD-10-CM

## 2015-07-14 DIAGNOSIS — R29898 Other symptoms and signs involving the musculoskeletal system: Secondary | ICD-10-CM | POA: Diagnosis not present

## 2015-07-14 DIAGNOSIS — M25522 Pain in left elbow: Secondary | ICD-10-CM

## 2015-07-14 DIAGNOSIS — M6289 Other specified disorders of muscle: Secondary | ICD-10-CM | POA: Insufficient documentation

## 2015-07-14 NOTE — Therapy (Signed)
[facility] [address] [place], [place], [address] Phone: [phone]   Fax:  [phone]  Occupational Therapy Treatment  Patient Details  Name: Shawn [name] MRN: [id number] Date of Birth: [birth year] Referring Provider: Dr. Bridgett Larsson  Encounter Date: 07/14/2015      OT End of Session - 07/14/15 0853    Visit Number 5   Number of Visits 9   Date for OT Re-Evaluation 08/05/15   Authorization Type medicare   OT Start Time 0850   OT Stop Time 0930   OT Time Calculation (min) 40 min      Past Medical History  Diagnosis Date  . Hypertension   . No pertinent past medical history   . Blind right eye   . Legally blind   . Anemia   . Complication of anesthesia   . PONV (postoperative nausea and vomiting)     N/V- became dehydrated had to have IV fluids- 01/2015  . Stroke [facility]) 2012    date per patient  . 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 Shortness of breath dyspnea     when has too fluid  . Diabetes mellitus     type II  . Chronic kidney disease     Dialysis since 2011 MWF  . Arthritis     patient denies    Past Surgical History  Procedure Laterality Date  . Left arm graft  10/2010  . Insertion of dialysis catheter Right   . Eye surgery Bilateral     retina surgery both eyes, cataract surgery both eyes  . Colonoscopy  06/2012  . Av fistula placement Right 05/12/2013    Procedure: INSERTION OF ARTERIOVENOUS (AV) GORE-TEX GRAFT ARM; ULTRASOUND GUIDED;  Surgeon: Conrad [place], MD;  Location: [facility] OR;  Service: Vascular;  Laterality: Right;  . Hernia repair      right inguinal  . Av fistula placement Left 01/18/2015    Procedure: INSERTION OF ARTERIOVENOUS GORE-TEX GRAFT LEFT UPPER ARM;  Surgeon: Conrad Krebs, MD;  Location: [place];  Service: Vascular;  Laterality: Left;  . Ligation arteriovenous gortex graft Left 05/03/2015    Procedure: LIGATION ARTERIOVENOUS GORTEX  GRAFT-LEFT UPPER ARM;  Surgeon: Conrad [place], MD;  Location: [facility];  Service: Vascular;  Laterality: Left;    There were no vitals filed for this visit.  Visit Diagnosis:  Weakness of left arm  Weakness of left hand  Stiffness of joint, shoulder region, left  Pain in joint, upper arm, left      Subjective Assessment - 07/14/15 0851    Subjective  Denies pain   Limitations see Epic   Patient Stated Goals strength in left arm   Currently in Pain? No/denies      Treatment: Pt agrees with plans for d/c . Therapist checked remaining goal. Reviewed green theraputty HEP for grip and pinch, pt returned demonstration. Upgraded previous theraband  HEP to red band and added A/ROM shoulder flexion in seated, pt performed with bilateral UE's after demonstration. Arm bike x 8 mins  Level 3 for conditioning                           OT Short Term Goals - 07/07/15 1109    OT SHORT TERM GOAL #1   Title I with HEP.   Time 4   Period Weeks   Status Achieved   OT SHORT TERM GOAL #2  Title Pt will increase LUE grip strength to 55  lbs for increased functional use   Baseline 60, 65 lbs   Time 5   Period Weeks   Status Achieved           OT Long Term Goals - 07/14/15 0263    OT LONG TERM GOAL #1   Title Pt will demonstrate shoulder flexion to 120 to retrieve a lightweight object with LUE, pain less than or equal to 3/10.   Baseline met,    Time 8   Period Weeks   Status Achieved   OT LONG TERM GOAL #2   Title Pt will report thathe is using LUE as a non dominant assist at least 75% of the time with pain less than or equal to 3/10.   Baseline 60--65% without any pain    Time 8   Period Weeks   Status Partially Met              OCCUPATIONAL THERAPY DISCHARGE SUMMARY    Current functional level related to goals / functional outcomes: Pt made excellent overall progress with LUE strength, functional use and now pt. Is pain free.   Remaining  deficits: Mild decrease in strength/ functional use   Education / Equipment: Pt was educated regarding HEP. HE returned demonstration.  Plan: Patient agrees to discharge.  Patient goals were partially met. Patient is being discharged due to meeting the stated rehab goals.  ?????     Problem List Patient Active Problem List   Diagnosis Date Noted  . Intracranial atherosclerosis 08/10/2014  . End stage renal disease ([place]) 06/12/2013  . ESRD on dialysis ([place]) 04/17/2013  . Cerebral artery occlusion with cerebral infarction ([place]) 04/02/2013  . Dizziness and giddiness 04/02/2013   . Shawn [name] 07/14/2015, 8:54 AM [name], OTR/L [phone] Phone: [phone] 8:55 AM 07/14/2015 [facility] [address] [place], [place], [address] Phone: [phone]   Fax:  [phone]  Name: Shawn [name] MRN: [id number] Date of Birth: 05/02/1961

## 2015-07-15 DIAGNOSIS — N186 End stage renal disease: Secondary | ICD-10-CM | POA: Diagnosis not present

## 2015-07-15 DIAGNOSIS — D509 Iron deficiency anemia, unspecified: Secondary | ICD-10-CM | POA: Diagnosis not present

## 2015-07-15 DIAGNOSIS — E119 Type 2 diabetes mellitus without complications: Secondary | ICD-10-CM | POA: Diagnosis not present

## 2015-07-15 DIAGNOSIS — D631 Anemia in chronic kidney disease: Secondary | ICD-10-CM | POA: Diagnosis not present

## 2015-07-15 DIAGNOSIS — N2581 Secondary hyperparathyroidism of renal origin: Secondary | ICD-10-CM | POA: Diagnosis not present

## 2015-07-18 DIAGNOSIS — N2581 Secondary hyperparathyroidism of renal origin: Secondary | ICD-10-CM | POA: Diagnosis not present

## 2015-07-18 DIAGNOSIS — D509 Iron deficiency anemia, unspecified: Secondary | ICD-10-CM | POA: Diagnosis not present

## 2015-07-18 DIAGNOSIS — E119 Type 2 diabetes mellitus without complications: Secondary | ICD-10-CM | POA: Diagnosis not present

## 2015-07-18 DIAGNOSIS — D631 Anemia in chronic kidney disease: Secondary | ICD-10-CM | POA: Diagnosis not present

## 2015-07-18 DIAGNOSIS — N186 End stage renal disease: Secondary | ICD-10-CM | POA: Diagnosis not present

## 2015-07-19 ENCOUNTER — Encounter: Payer: Medicare Other | Admitting: Occupational Therapy

## 2015-07-20 DIAGNOSIS — D509 Iron deficiency anemia, unspecified: Secondary | ICD-10-CM | POA: Diagnosis not present

## 2015-07-20 DIAGNOSIS — E119 Type 2 diabetes mellitus without complications: Secondary | ICD-10-CM | POA: Diagnosis not present

## 2015-07-20 DIAGNOSIS — N186 End stage renal disease: Secondary | ICD-10-CM | POA: Diagnosis not present

## 2015-07-20 DIAGNOSIS — D631 Anemia in chronic kidney disease: Secondary | ICD-10-CM | POA: Diagnosis not present

## 2015-07-20 DIAGNOSIS — N2581 Secondary hyperparathyroidism of renal origin: Secondary | ICD-10-CM | POA: Diagnosis not present

## 2015-07-22 DIAGNOSIS — D631 Anemia in chronic kidney disease: Secondary | ICD-10-CM | POA: Diagnosis not present

## 2015-07-22 DIAGNOSIS — E119 Type 2 diabetes mellitus without complications: Secondary | ICD-10-CM | POA: Diagnosis not present

## 2015-07-22 DIAGNOSIS — N186 End stage renal disease: Secondary | ICD-10-CM | POA: Diagnosis not present

## 2015-07-22 DIAGNOSIS — D509 Iron deficiency anemia, unspecified: Secondary | ICD-10-CM | POA: Diagnosis not present

## 2015-07-22 DIAGNOSIS — N2581 Secondary hyperparathyroidism of renal origin: Secondary | ICD-10-CM | POA: Diagnosis not present

## 2015-07-25 DIAGNOSIS — E119 Type 2 diabetes mellitus without complications: Secondary | ICD-10-CM | POA: Diagnosis not present

## 2015-07-25 DIAGNOSIS — D509 Iron deficiency anemia, unspecified: Secondary | ICD-10-CM | POA: Diagnosis not present

## 2015-07-25 DIAGNOSIS — N186 End stage renal disease: Secondary | ICD-10-CM | POA: Diagnosis not present

## 2015-07-25 DIAGNOSIS — N2581 Secondary hyperparathyroidism of renal origin: Secondary | ICD-10-CM | POA: Diagnosis not present

## 2015-07-25 DIAGNOSIS — D631 Anemia in chronic kidney disease: Secondary | ICD-10-CM | POA: Diagnosis not present

## 2015-07-26 ENCOUNTER — Encounter: Payer: Medicare Other | Admitting: Occupational Therapy

## 2015-07-27 DIAGNOSIS — N2581 Secondary hyperparathyroidism of renal origin: Secondary | ICD-10-CM | POA: Diagnosis not present

## 2015-07-27 DIAGNOSIS — D509 Iron deficiency anemia, unspecified: Secondary | ICD-10-CM | POA: Diagnosis not present

## 2015-07-27 DIAGNOSIS — E119 Type 2 diabetes mellitus without complications: Secondary | ICD-10-CM | POA: Diagnosis not present

## 2015-07-27 DIAGNOSIS — D631 Anemia in chronic kidney disease: Secondary | ICD-10-CM | POA: Diagnosis not present

## 2015-07-27 DIAGNOSIS — N186 End stage renal disease: Secondary | ICD-10-CM | POA: Diagnosis not present

## 2015-07-29 DIAGNOSIS — N186 End stage renal disease: Secondary | ICD-10-CM | POA: Diagnosis not present

## 2015-07-29 DIAGNOSIS — D631 Anemia in chronic kidney disease: Secondary | ICD-10-CM | POA: Diagnosis not present

## 2015-07-29 DIAGNOSIS — E119 Type 2 diabetes mellitus without complications: Secondary | ICD-10-CM | POA: Diagnosis not present

## 2015-07-29 DIAGNOSIS — D509 Iron deficiency anemia, unspecified: Secondary | ICD-10-CM | POA: Diagnosis not present

## 2015-07-29 DIAGNOSIS — N2581 Secondary hyperparathyroidism of renal origin: Secondary | ICD-10-CM | POA: Diagnosis not present

## 2015-08-01 DIAGNOSIS — N186 End stage renal disease: Secondary | ICD-10-CM | POA: Diagnosis not present

## 2015-08-01 DIAGNOSIS — D631 Anemia in chronic kidney disease: Secondary | ICD-10-CM | POA: Diagnosis not present

## 2015-08-01 DIAGNOSIS — E119 Type 2 diabetes mellitus without complications: Secondary | ICD-10-CM | POA: Diagnosis not present

## 2015-08-01 DIAGNOSIS — N2581 Secondary hyperparathyroidism of renal origin: Secondary | ICD-10-CM | POA: Diagnosis not present

## 2015-08-01 DIAGNOSIS — D509 Iron deficiency anemia, unspecified: Secondary | ICD-10-CM | POA: Diagnosis not present

## 2015-08-02 ENCOUNTER — Encounter: Payer: Medicare Other | Admitting: Occupational Therapy

## 2015-08-03 DIAGNOSIS — N2581 Secondary hyperparathyroidism of renal origin: Secondary | ICD-10-CM | POA: Diagnosis not present

## 2015-08-03 DIAGNOSIS — E119 Type 2 diabetes mellitus without complications: Secondary | ICD-10-CM | POA: Diagnosis not present

## 2015-08-03 DIAGNOSIS — N186 End stage renal disease: Secondary | ICD-10-CM | POA: Diagnosis not present

## 2015-08-03 DIAGNOSIS — D631 Anemia in chronic kidney disease: Secondary | ICD-10-CM | POA: Diagnosis not present

## 2015-08-03 DIAGNOSIS — D509 Iron deficiency anemia, unspecified: Secondary | ICD-10-CM | POA: Diagnosis not present

## 2015-08-03 DIAGNOSIS — E1129 Type 2 diabetes mellitus with other diabetic kidney complication: Secondary | ICD-10-CM | POA: Diagnosis not present

## 2015-08-05 DIAGNOSIS — E119 Type 2 diabetes mellitus without complications: Secondary | ICD-10-CM | POA: Diagnosis not present

## 2015-08-05 DIAGNOSIS — N186 End stage renal disease: Secondary | ICD-10-CM | POA: Diagnosis not present

## 2015-08-05 DIAGNOSIS — D631 Anemia in chronic kidney disease: Secondary | ICD-10-CM | POA: Diagnosis not present

## 2015-08-05 DIAGNOSIS — N2581 Secondary hyperparathyroidism of renal origin: Secondary | ICD-10-CM | POA: Diagnosis not present

## 2015-08-05 DIAGNOSIS — D509 Iron deficiency anemia, unspecified: Secondary | ICD-10-CM | POA: Diagnosis not present

## 2015-08-08 DIAGNOSIS — E119 Type 2 diabetes mellitus without complications: Secondary | ICD-10-CM | POA: Diagnosis not present

## 2015-08-08 DIAGNOSIS — N2581 Secondary hyperparathyroidism of renal origin: Secondary | ICD-10-CM | POA: Diagnosis not present

## 2015-08-08 DIAGNOSIS — D509 Iron deficiency anemia, unspecified: Secondary | ICD-10-CM | POA: Diagnosis not present

## 2015-08-08 DIAGNOSIS — N186 End stage renal disease: Secondary | ICD-10-CM | POA: Diagnosis not present

## 2015-08-08 DIAGNOSIS — D631 Anemia in chronic kidney disease: Secondary | ICD-10-CM | POA: Diagnosis not present

## 2015-08-09 DIAGNOSIS — N186 End stage renal disease: Secondary | ICD-10-CM | POA: Diagnosis not present

## 2015-08-09 DIAGNOSIS — Z992 Dependence on renal dialysis: Secondary | ICD-10-CM | POA: Diagnosis not present

## 2015-08-09 DIAGNOSIS — I120 Hypertensive chronic kidney disease with stage 5 chronic kidney disease or end stage renal disease: Secondary | ICD-10-CM | POA: Diagnosis not present

## 2015-08-10 DIAGNOSIS — E119 Type 2 diabetes mellitus without complications: Secondary | ICD-10-CM | POA: Diagnosis not present

## 2015-08-10 DIAGNOSIS — D509 Iron deficiency anemia, unspecified: Secondary | ICD-10-CM | POA: Diagnosis not present

## 2015-08-10 DIAGNOSIS — N186 End stage renal disease: Secondary | ICD-10-CM | POA: Diagnosis not present

## 2015-08-10 DIAGNOSIS — N2581 Secondary hyperparathyroidism of renal origin: Secondary | ICD-10-CM | POA: Diagnosis not present

## 2015-08-10 DIAGNOSIS — D631 Anemia in chronic kidney disease: Secondary | ICD-10-CM | POA: Diagnosis not present

## 2015-08-11 ENCOUNTER — Ambulatory Visit (INDEPENDENT_AMBULATORY_CARE_PROVIDER_SITE_OTHER): Payer: Medicare Other | Admitting: Nurse Practitioner

## 2015-08-11 ENCOUNTER — Encounter: Payer: Self-pay | Admitting: Nurse Practitioner

## 2015-08-11 VITALS — BP 111/72 | HR 88 | Ht 71.0 in | Wt 230.2 lb

## 2015-08-11 DIAGNOSIS — N186 End stage renal disease: Secondary | ICD-10-CM

## 2015-08-11 DIAGNOSIS — I672 Cerebral atherosclerosis: Secondary | ICD-10-CM

## 2015-08-11 DIAGNOSIS — I6350 Cerebral infarction due to unspecified occlusion or stenosis of unspecified cerebral artery: Secondary | ICD-10-CM

## 2015-08-11 NOTE — Patient Instructions (Signed)
Continue aspirin 325 secondary stroke prevention Control of diabetes with hemoglobin A1c below 6.5  ,last 8.1 LDL cholesterol goal below 70 continue lipitor and wellchol Follow-up in 6 months next visit with Dr. Leonie Man

## 2015-08-11 NOTE — Progress Notes (Signed)
[facility]  PATIENT: Shawn [name] DOB: 02/19/1961   REASON FOR VISIT: Follow-up for history of stroke, end-stage renal disease,  intracranial atherosclerosis HISTORY FROM: Patient alone at visit   HISTORY OF PRESENT ILLNESS:2/2/17Mr. [name], 55 year old male returns for followup. He has history of stroke which occurred in April 2012 he is currently on aspirin 325 without further stroke or TIA symptoms. He goes to dialysis 3 times a week. He had a revision of his AV shunt 01/18/15 but now has portacath in right chest. 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 He denies any recent falls , he ambulates with a single-point cane. He returns for reevaluation. He has vascular risk factors of  Diabetes and hyperlipidemia. His last hemoglobin A1c was 8.1 and he's had adjustments to his insulin, but he tells me today that his insulin is so expensive that there are days that he does not take the medication. He says he has told his primary care this. Last carotid Doppler was negative for significant stenosis and transcranial Doppler showed elevated velocities in the left middle cerebral artery suggestive of mild stenosis, globally elevated pulsatility indexes suggestive of diffuse intracranial atherosclerosis. His blood pressure 111/72 in the office this morning, he has dizziness when he does not drink enough fluids. 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 The patient presented with gait instability, vertigo and dizziness, which have resolved completely, except the patient still has some gait instability. But he can walk pretty well with a cane. 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 The patient had cataract surgery and retinal repair on both of his eyes before  the stroke. The patient has a PMH of end stage renal disease, and goes to dialysis 3x a week.  He Is seen urgently today following recent visit to [facility] emergency room on 03/04/12 for dizziness. He woke up that day feeling dizzy and off-balance and with a sensation of body being pulled to the right side. This was noticed mainly when he was in the upright position and he felt better when he sat down or lay down. He denied any vertigo, nausea, diplopia. He did have some minor tingling in his right hand. His blood pressure was recorded as 133/85 in the ER but they did not check orthostatics. CBC and BMP were unremarkable. Patient states he had similar but milder episode today's later following dialysis when his blood pressure was recorded being low in the 80s. Similarly symptoms were noticeable when he was upright and improved when he lays flat. He complains of difficulty walking and exercising particularly on the days he gets dialysis as he feels quite tired. He had lipid profile checked last month by Dr. Katherine Roan and LDL was elevated but he cannot give me the exact numbers and I do not have those results. He continues to have poor vision annd uses a cane to walk.  Update 08/10/2014 : He returns for follow-up after last visit on 09/29/13. He continues to do well from neurovascular standpoint without recurrent stroke or TIA symptoms. He is tolerating aspirin without significant bleeding or bruising. He had lipid profile checked 3 months ago by his primary physician and it was fine. His blood pressure usually runs quite low blood is low in office today.  His last hemoglobin A1c was 6.8 done a few months ago. He had follow-up carotid ultrasound done on 10/07/13 which I personally reviewed showed no significant extra: Stenosis. Transcranial Doppler studies showed elevated velocities in the left middle cerebral artery suggestive of mild stenosis. Globally elevated pulsatility indexes and suggested diffuse intracranial  atherosclerosis. He continues to have poor vision in his eyes from diabetic retinopathy and uses a cane to ambulate.     REVIEW OF SYSTEMS: Full 14 system review of systems performed and notable only for those listed, all others are neg:  Constitutional: neg  Cardiovascular: neg Ear/Nose/Throat: neg  Skin: neg Eyes: Blind in the right eye Respiratory: neg Gastroitestinal: neg  Hematology/Lymphatic: neg  Endocrine: neg Musculoskeletal: Gait abnormality  Allergy/Immunology: neg Neurological: History of stroke Psychiatric: neg Sleep : neg   ALLERGIES: No Known Allergies  HOME MEDICATIONS: Outpatient Prescriptions Prior to Visit  Medication Sig Dispense Refill  . aspirin 325 MG tablet Take 325 mg by mouth daily.    Marland Kitchen atorvastatin (LIPITOR) 20 MG tablet Take 20 mg by mouth daily.    . B Complex Vitamins (B COMPLEX-B12) TABS Take 1 tablet by mouth daily.    . cinacalcet (SENSIPAR) 30 MG tablet Take 30 mg by mouth daily with supper.    . colesevelam (WELCHOL) 625 MG tablet Take 1,250 mg by mouth daily with supper.    . dorzolamide-timolol (COSOPT) 22.3-6.8 MG/ML ophthalmic solution Place 1 drop into the left eye 2 (two) times daily.    . insulin NPH-insulin regular (NOVOLIN 70/30) (70-30) 100 UNIT/ML injection Inject 15 Units into the skin 2 (two) times daily with a meal.    . lanthanum (FOSRENOL) 1000 MG chewable tablet Chew 1,000 mg by mouth 3 (three) times daily with meals.    . latanoprost (XALATAN) 0.005 % ophthalmic solution Place 1 drop into the left eye at bedtime.    . ondansetron (ZOFRAN ODT) 8 MG disintegrating tablet Take 1 tablet (8 mg total) by mouth every 8 (eight) hours as needed for nausea or vomiting. (Patient not taking: Reported on 08/11/2015) 20 tablet 0  . oxyCODONE-acetaminophen (ROXICET) 5-325 MG tablet Take 1-2 tablets by mouth every 4 (four) hours as needed for severe pain. 10 tablet 0   No facility-administered medications prior to visit.    PAST MEDICAL  HISTORY: Past Medical History  Diagnosis Date  . Hypertension   . No pertinent past medical history   . Blind right eye   . Legally blind   . Anemia   . Complication of anesthesia   . PONV (postoperative nausea and vomiting)     N/V- became dehydrated had to have IV fluids- 01/2015  . Stroke [facility]) 2012    date per patient  . 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 Shortness of breath dyspnea     when has too fluid  . Diabetes mellitus     type II  . Chronic kidney disease     Dialysis since 2011 MWF  . Arthritis     patient denies    PAST SURGICAL HISTORY: Past Surgical History  Procedure Laterality Date  . Left arm graft  10/2010  . Insertion of dialysis catheter Right   . Eye surgery Bilateral     retina surgery both eyes, cataract surgery both eyes  . Colonoscopy  06/2012  . Av fistula placement Right 05/12/2013    Procedure: INSERTION OF ARTERIOVENOUS (AV) GORE-TEX GRAFT ARM; ULTRASOUND GUIDED;  Surgeon: Conrad Ballplay, MD;  Location: [facility] OR;  Service: Vascular;  Laterality: Right;  . Hernia repair      right inguinal  . Av fistula placement Left 01/18/2015    Procedure: INSERTION OF ARTERIOVENOUS GORE-TEX GRAFT LEFT UPPER ARM;  Surgeon: Conrad [place], MD;  Location: [place];  Service: Vascular;  Laterality: Left;  . Ligation arteriovenous gortex graft Left 05/03/2015    Procedure: LIGATION ARTERIOVENOUS GORTEX GRAFT-LEFT UPPER ARM;  Surgeon: Conrad Arabi, MD;  Location: [facility] OR;  Service: Vascular;  Laterality: Left;    FAMILY HISTORY: Family History  Problem Relation Age of Onset  . Cancer Sister   . Stroke Sister   . Diabetes Mother   . Alzheimer's disease Mother   . Cancer Father   . Heart disease Father     SOCIAL HISTORY: Social History   Social History  . Marital Status: Married    Spouse Name: N/A  . Number of Children: 0  . Years of Education: 12th   Occupational History  .     Social History Main Topics  . Smoking  status: Never Smoker   . Smokeless tobacco: Never Used  . Alcohol Use: No  . Drug Use: No  . Sexual Activity: Not on file   Other Topics Concern  . Not on file   Social History Narrative   Patient lives at home with wife.    Patient is disabled.    Patient has no children.    Patient has a 12 grade education.            PHYSICAL EXAM  Filed Vitals:   08/11/15 1025  BP: 111/72  Pulse: 88  Height: 5\' 11"  (1.803 m)  Weight: 230 lb 3.2 oz (104.418 kg)   Body mass index is 32.12 kg/(m^2). Generalized: Well developed, in no acute distress  Head: normocephalic and atraumatic,. Oropharynx benign  Neck: Supple, no carotid bruits  Cardiac: Regular rate rhythm, no murmur  Lungs clear to auscultation   Neurological examination   Mentation: Alert oriented to time, place, history taking. Follows all commands speech and language fluent  Cranial nerve II-XII: Pupils are enlarged right greater than left and neither react. Conjugate eye movements are restricted on the left just moving medial and movement on the right, visual fields are restricted to confrontation on the left visual field testing central and medial vision field loss laterally. Right full vision loss Facial sensation and strength were normal. hearing was intact to finger rubbing bilaterally. Uvula tongue midline. head turning and shoulder shrug were normal and symmetric.Tongue protrusion into cheek strength was normal. Motor: normal bulk and tone, full strength in the BUE, BLE, fine finger movements normal, no pronator drift. No focal weakness Sensory: normal and symmetric to light touch, pinprick, and vibration  Coordination: Unable due to visual deficits Reflexes: 1+ upper and lower and symmetric Gait and Station: Wide-based gait with cane, unsteady with tandem    DIAGNOSTIC DATA (LABS, IMAGING, TESTING) - I reviewed patient records, labs, notes, testing and imaging myself where available.  Lab Results    Component Value Date   WBC 10.2 01/18/2015   HGB 13.6 05/03/2015   HCT 40.0 05/03/2015   MCV 90.2 01/18/2015   PLT 228 01/18/2015      Component Value Date/Time   NA 137 05/03/2015 0638   K 3.5 05/03/2015 0638   CL 94* 01/18/2015 1844   CO2 25 01/18/2015 1844   GLUCOSE 106* 05/03/2015 0638   BUN 38* 01/18/2015 1844   CREATININE  9.01* 01/18/2015 1844   CALCIUM 8.1* 01/18/2015 1844   CALCIUM 7.7* 01/28/2010 1225   PROT 7.5 01/18/2015 1844   ALBUMIN 3.6 01/18/2015 1844   AST 18 01/18/2015 1844   ALT 14* 01/18/2015 1844   ALKPHOS 113 01/18/2015 1844   BILITOT 0.7 01/18/2015 1844   GFRNONAA 6* 01/18/2015 1844   GFRAA 7* 01/18/2015 1844        ASSESSMENT AND PLAN 55 y.o. year old male has a past medical history of Stroke; Diabetes mellitus; Arthritis; Blind right eye; Unspecified cerebral artery occlusion with cerebral infarction (04/02/2013); Chronic kidney disease; Legally blind; and Anemia here to follow-up.  Continue aspirin 325 secondary stroke prevention Control of diabetes with hemoglobin A1c below 6.5  ,last 8.1 LDL cholesterol goal below 70 continue lipitor and wellchol Systolic blood pressure less than 130, today's reading 111/72 Healthy diet and exercise as tolerated Follow-up in 6 months next visit with Dr. Sloan Leiter, [facility], [facility], [facility] [address], [place], [place] [address] [phone]

## 2015-08-12 DIAGNOSIS — D631 Anemia in chronic kidney disease: Secondary | ICD-10-CM | POA: Diagnosis not present

## 2015-08-12 DIAGNOSIS — N2581 Secondary hyperparathyroidism of renal origin: Secondary | ICD-10-CM | POA: Diagnosis not present

## 2015-08-12 DIAGNOSIS — D509 Iron deficiency anemia, unspecified: Secondary | ICD-10-CM | POA: Diagnosis not present

## 2015-08-12 DIAGNOSIS — N186 End stage renal disease: Secondary | ICD-10-CM | POA: Diagnosis not present

## 2015-08-12 DIAGNOSIS — E119 Type 2 diabetes mellitus without complications: Secondary | ICD-10-CM | POA: Diagnosis not present

## 2015-08-13 NOTE — Progress Notes (Signed)
I agree with the above plan 

## 2015-08-15 DIAGNOSIS — D631 Anemia in chronic kidney disease: Secondary | ICD-10-CM | POA: Diagnosis not present

## 2015-08-15 DIAGNOSIS — D509 Iron deficiency anemia, unspecified: Secondary | ICD-10-CM | POA: Diagnosis not present

## 2015-08-15 DIAGNOSIS — E119 Type 2 diabetes mellitus without complications: Secondary | ICD-10-CM | POA: Diagnosis not present

## 2015-08-15 DIAGNOSIS — N2581 Secondary hyperparathyroidism of renal origin: Secondary | ICD-10-CM | POA: Diagnosis not present

## 2015-08-15 DIAGNOSIS — N186 End stage renal disease: Secondary | ICD-10-CM | POA: Diagnosis not present

## 2015-08-17 DIAGNOSIS — E119 Type 2 diabetes mellitus without complications: Secondary | ICD-10-CM | POA: Diagnosis not present

## 2015-08-17 DIAGNOSIS — D631 Anemia in chronic kidney disease: Secondary | ICD-10-CM | POA: Diagnosis not present

## 2015-08-17 DIAGNOSIS — N186 End stage renal disease: Secondary | ICD-10-CM | POA: Diagnosis not present

## 2015-08-17 DIAGNOSIS — D509 Iron deficiency anemia, unspecified: Secondary | ICD-10-CM | POA: Diagnosis not present

## 2015-08-17 DIAGNOSIS — N2581 Secondary hyperparathyroidism of renal origin: Secondary | ICD-10-CM | POA: Diagnosis not present

## 2015-08-19 DIAGNOSIS — D509 Iron deficiency anemia, unspecified: Secondary | ICD-10-CM | POA: Diagnosis not present

## 2015-08-19 DIAGNOSIS — N186 End stage renal disease: Secondary | ICD-10-CM | POA: Diagnosis not present

## 2015-08-19 DIAGNOSIS — D631 Anemia in chronic kidney disease: Secondary | ICD-10-CM | POA: Diagnosis not present

## 2015-08-19 DIAGNOSIS — N2581 Secondary hyperparathyroidism of renal origin: Secondary | ICD-10-CM | POA: Diagnosis not present

## 2015-08-19 DIAGNOSIS — E119 Type 2 diabetes mellitus without complications: Secondary | ICD-10-CM | POA: Diagnosis not present

## 2015-08-22 DIAGNOSIS — D631 Anemia in chronic kidney disease: Secondary | ICD-10-CM | POA: Diagnosis not present

## 2015-08-22 DIAGNOSIS — N186 End stage renal disease: Secondary | ICD-10-CM | POA: Diagnosis not present

## 2015-08-22 DIAGNOSIS — D509 Iron deficiency anemia, unspecified: Secondary | ICD-10-CM | POA: Diagnosis not present

## 2015-08-22 DIAGNOSIS — E119 Type 2 diabetes mellitus without complications: Secondary | ICD-10-CM | POA: Diagnosis not present

## 2015-08-22 DIAGNOSIS — N2581 Secondary hyperparathyroidism of renal origin: Secondary | ICD-10-CM | POA: Diagnosis not present

## 2015-08-24 DIAGNOSIS — N186 End stage renal disease: Secondary | ICD-10-CM | POA: Diagnosis not present

## 2015-08-24 DIAGNOSIS — D509 Iron deficiency anemia, unspecified: Secondary | ICD-10-CM | POA: Diagnosis not present

## 2015-08-24 DIAGNOSIS — E119 Type 2 diabetes mellitus without complications: Secondary | ICD-10-CM | POA: Diagnosis not present

## 2015-08-24 DIAGNOSIS — D631 Anemia in chronic kidney disease: Secondary | ICD-10-CM | POA: Diagnosis not present

## 2015-08-24 DIAGNOSIS — N2581 Secondary hyperparathyroidism of renal origin: Secondary | ICD-10-CM | POA: Diagnosis not present

## 2015-08-26 DIAGNOSIS — N2581 Secondary hyperparathyroidism of renal origin: Secondary | ICD-10-CM | POA: Diagnosis not present

## 2015-08-26 DIAGNOSIS — D631 Anemia in chronic kidney disease: Secondary | ICD-10-CM | POA: Diagnosis not present

## 2015-08-26 DIAGNOSIS — N186 End stage renal disease: Secondary | ICD-10-CM | POA: Diagnosis not present

## 2015-08-26 DIAGNOSIS — D509 Iron deficiency anemia, unspecified: Secondary | ICD-10-CM | POA: Diagnosis not present

## 2015-08-26 DIAGNOSIS — E119 Type 2 diabetes mellitus without complications: Secondary | ICD-10-CM | POA: Diagnosis not present

## 2015-08-29 DIAGNOSIS — D509 Iron deficiency anemia, unspecified: Secondary | ICD-10-CM | POA: Diagnosis not present

## 2015-08-29 DIAGNOSIS — E119 Type 2 diabetes mellitus without complications: Secondary | ICD-10-CM | POA: Diagnosis not present

## 2015-08-29 DIAGNOSIS — N186 End stage renal disease: Secondary | ICD-10-CM | POA: Diagnosis not present

## 2015-08-29 DIAGNOSIS — D631 Anemia in chronic kidney disease: Secondary | ICD-10-CM | POA: Diagnosis not present

## 2015-08-29 DIAGNOSIS — N2581 Secondary hyperparathyroidism of renal origin: Secondary | ICD-10-CM | POA: Diagnosis not present

## 2015-08-31 DIAGNOSIS — D509 Iron deficiency anemia, unspecified: Secondary | ICD-10-CM | POA: Diagnosis not present

## 2015-08-31 DIAGNOSIS — N2581 Secondary hyperparathyroidism of renal origin: Secondary | ICD-10-CM | POA: Diagnosis not present

## 2015-08-31 DIAGNOSIS — D631 Anemia in chronic kidney disease: Secondary | ICD-10-CM | POA: Diagnosis not present

## 2015-08-31 DIAGNOSIS — E119 Type 2 diabetes mellitus without complications: Secondary | ICD-10-CM | POA: Diagnosis not present

## 2015-08-31 DIAGNOSIS — N186 End stage renal disease: Secondary | ICD-10-CM | POA: Diagnosis not present

## 2015-09-02 DIAGNOSIS — N186 End stage renal disease: Secondary | ICD-10-CM | POA: Diagnosis not present

## 2015-09-02 DIAGNOSIS — D509 Iron deficiency anemia, unspecified: Secondary | ICD-10-CM | POA: Diagnosis not present

## 2015-09-02 DIAGNOSIS — N2581 Secondary hyperparathyroidism of renal origin: Secondary | ICD-10-CM | POA: Diagnosis not present

## 2015-09-02 DIAGNOSIS — E119 Type 2 diabetes mellitus without complications: Secondary | ICD-10-CM | POA: Diagnosis not present

## 2015-09-02 DIAGNOSIS — D631 Anemia in chronic kidney disease: Secondary | ICD-10-CM | POA: Diagnosis not present

## 2015-09-05 DIAGNOSIS — D509 Iron deficiency anemia, unspecified: Secondary | ICD-10-CM | POA: Diagnosis not present

## 2015-09-05 DIAGNOSIS — D631 Anemia in chronic kidney disease: Secondary | ICD-10-CM | POA: Diagnosis not present

## 2015-09-05 DIAGNOSIS — E119 Type 2 diabetes mellitus without complications: Secondary | ICD-10-CM | POA: Diagnosis not present

## 2015-09-05 DIAGNOSIS — N186 End stage renal disease: Secondary | ICD-10-CM | POA: Diagnosis not present

## 2015-09-05 DIAGNOSIS — N2581 Secondary hyperparathyroidism of renal origin: Secondary | ICD-10-CM | POA: Diagnosis not present

## 2015-09-06 DIAGNOSIS — Z992 Dependence on renal dialysis: Secondary | ICD-10-CM | POA: Diagnosis not present

## 2015-09-06 DIAGNOSIS — N186 End stage renal disease: Secondary | ICD-10-CM | POA: Diagnosis not present

## 2015-09-06 DIAGNOSIS — I120 Hypertensive chronic kidney disease with stage 5 chronic kidney disease or end stage renal disease: Secondary | ICD-10-CM | POA: Diagnosis not present

## 2015-09-07 DIAGNOSIS — N186 End stage renal disease: Secondary | ICD-10-CM | POA: Diagnosis not present

## 2015-09-07 DIAGNOSIS — N2581 Secondary hyperparathyroidism of renal origin: Secondary | ICD-10-CM | POA: Diagnosis not present

## 2015-09-07 DIAGNOSIS — D509 Iron deficiency anemia, unspecified: Secondary | ICD-10-CM | POA: Diagnosis not present

## 2015-09-07 DIAGNOSIS — E119 Type 2 diabetes mellitus without complications: Secondary | ICD-10-CM | POA: Diagnosis not present

## 2015-09-07 DIAGNOSIS — D631 Anemia in chronic kidney disease: Secondary | ICD-10-CM | POA: Diagnosis not present

## 2015-09-09 DIAGNOSIS — N186 End stage renal disease: Secondary | ICD-10-CM | POA: Diagnosis not present

## 2015-09-09 DIAGNOSIS — D509 Iron deficiency anemia, unspecified: Secondary | ICD-10-CM | POA: Diagnosis not present

## 2015-09-09 DIAGNOSIS — N2581 Secondary hyperparathyroidism of renal origin: Secondary | ICD-10-CM | POA: Diagnosis not present

## 2015-09-09 DIAGNOSIS — E119 Type 2 diabetes mellitus without complications: Secondary | ICD-10-CM | POA: Diagnosis not present

## 2015-09-09 DIAGNOSIS — D631 Anemia in chronic kidney disease: Secondary | ICD-10-CM | POA: Diagnosis not present

## 2015-09-12 DIAGNOSIS — N186 End stage renal disease: Secondary | ICD-10-CM | POA: Diagnosis not present

## 2015-09-12 DIAGNOSIS — D631 Anemia in chronic kidney disease: Secondary | ICD-10-CM | POA: Diagnosis not present

## 2015-09-12 DIAGNOSIS — N2581 Secondary hyperparathyroidism of renal origin: Secondary | ICD-10-CM | POA: Diagnosis not present

## 2015-09-12 DIAGNOSIS — D509 Iron deficiency anemia, unspecified: Secondary | ICD-10-CM | POA: Diagnosis not present

## 2015-09-12 DIAGNOSIS — E119 Type 2 diabetes mellitus without complications: Secondary | ICD-10-CM | POA: Diagnosis not present

## 2015-09-14 DIAGNOSIS — E119 Type 2 diabetes mellitus without complications: Secondary | ICD-10-CM | POA: Diagnosis not present

## 2015-09-14 DIAGNOSIS — D509 Iron deficiency anemia, unspecified: Secondary | ICD-10-CM | POA: Diagnosis not present

## 2015-09-14 DIAGNOSIS — N186 End stage renal disease: Secondary | ICD-10-CM | POA: Diagnosis not present

## 2015-09-14 DIAGNOSIS — D631 Anemia in chronic kidney disease: Secondary | ICD-10-CM | POA: Diagnosis not present

## 2015-09-14 DIAGNOSIS — N2581 Secondary hyperparathyroidism of renal origin: Secondary | ICD-10-CM | POA: Diagnosis not present

## 2015-09-16 DIAGNOSIS — N2581 Secondary hyperparathyroidism of renal origin: Secondary | ICD-10-CM | POA: Diagnosis not present

## 2015-09-16 DIAGNOSIS — E119 Type 2 diabetes mellitus without complications: Secondary | ICD-10-CM | POA: Diagnosis not present

## 2015-09-16 DIAGNOSIS — D631 Anemia in chronic kidney disease: Secondary | ICD-10-CM | POA: Diagnosis not present

## 2015-09-16 DIAGNOSIS — N186 End stage renal disease: Secondary | ICD-10-CM | POA: Diagnosis not present

## 2015-09-16 DIAGNOSIS — D509 Iron deficiency anemia, unspecified: Secondary | ICD-10-CM | POA: Diagnosis not present

## 2015-09-19 DIAGNOSIS — D631 Anemia in chronic kidney disease: Secondary | ICD-10-CM | POA: Diagnosis not present

## 2015-09-19 DIAGNOSIS — E119 Type 2 diabetes mellitus without complications: Secondary | ICD-10-CM | POA: Diagnosis not present

## 2015-09-19 DIAGNOSIS — D509 Iron deficiency anemia, unspecified: Secondary | ICD-10-CM | POA: Diagnosis not present

## 2015-09-19 DIAGNOSIS — N186 End stage renal disease: Secondary | ICD-10-CM | POA: Diagnosis not present

## 2015-09-19 DIAGNOSIS — N2581 Secondary hyperparathyroidism of renal origin: Secondary | ICD-10-CM | POA: Diagnosis not present

## 2015-09-21 DIAGNOSIS — E119 Type 2 diabetes mellitus without complications: Secondary | ICD-10-CM | POA: Diagnosis not present

## 2015-09-21 DIAGNOSIS — N186 End stage renal disease: Secondary | ICD-10-CM | POA: Diagnosis not present

## 2015-09-21 DIAGNOSIS — D631 Anemia in chronic kidney disease: Secondary | ICD-10-CM | POA: Diagnosis not present

## 2015-09-21 DIAGNOSIS — N2581 Secondary hyperparathyroidism of renal origin: Secondary | ICD-10-CM | POA: Diagnosis not present

## 2015-09-21 DIAGNOSIS — D509 Iron deficiency anemia, unspecified: Secondary | ICD-10-CM | POA: Diagnosis not present

## 2015-09-23 DIAGNOSIS — E119 Type 2 diabetes mellitus without complications: Secondary | ICD-10-CM | POA: Diagnosis not present

## 2015-09-23 DIAGNOSIS — D509 Iron deficiency anemia, unspecified: Secondary | ICD-10-CM | POA: Diagnosis not present

## 2015-09-23 DIAGNOSIS — N186 End stage renal disease: Secondary | ICD-10-CM | POA: Diagnosis not present

## 2015-09-23 DIAGNOSIS — N2581 Secondary hyperparathyroidism of renal origin: Secondary | ICD-10-CM | POA: Diagnosis not present

## 2015-09-23 DIAGNOSIS — D631 Anemia in chronic kidney disease: Secondary | ICD-10-CM | POA: Diagnosis not present

## 2015-09-26 DIAGNOSIS — E119 Type 2 diabetes mellitus without complications: Secondary | ICD-10-CM | POA: Diagnosis not present

## 2015-09-26 DIAGNOSIS — D631 Anemia in chronic kidney disease: Secondary | ICD-10-CM | POA: Diagnosis not present

## 2015-09-26 DIAGNOSIS — D509 Iron deficiency anemia, unspecified: Secondary | ICD-10-CM | POA: Diagnosis not present

## 2015-09-26 DIAGNOSIS — N2581 Secondary hyperparathyroidism of renal origin: Secondary | ICD-10-CM | POA: Diagnosis not present

## 2015-09-26 DIAGNOSIS — N186 End stage renal disease: Secondary | ICD-10-CM | POA: Diagnosis not present

## 2015-09-28 DIAGNOSIS — N186 End stage renal disease: Secondary | ICD-10-CM | POA: Diagnosis not present

## 2015-09-28 DIAGNOSIS — D509 Iron deficiency anemia, unspecified: Secondary | ICD-10-CM | POA: Diagnosis not present

## 2015-09-28 DIAGNOSIS — E119 Type 2 diabetes mellitus without complications: Secondary | ICD-10-CM | POA: Diagnosis not present

## 2015-09-28 DIAGNOSIS — N2581 Secondary hyperparathyroidism of renal origin: Secondary | ICD-10-CM | POA: Diagnosis not present

## 2015-09-28 DIAGNOSIS — D631 Anemia in chronic kidney disease: Secondary | ICD-10-CM | POA: Diagnosis not present

## 2015-09-30 DIAGNOSIS — D631 Anemia in chronic kidney disease: Secondary | ICD-10-CM | POA: Diagnosis not present

## 2015-09-30 DIAGNOSIS — E119 Type 2 diabetes mellitus without complications: Secondary | ICD-10-CM | POA: Diagnosis not present

## 2015-09-30 DIAGNOSIS — D509 Iron deficiency anemia, unspecified: Secondary | ICD-10-CM | POA: Diagnosis not present

## 2015-09-30 DIAGNOSIS — N2581 Secondary hyperparathyroidism of renal origin: Secondary | ICD-10-CM | POA: Diagnosis not present

## 2015-09-30 DIAGNOSIS — N186 End stage renal disease: Secondary | ICD-10-CM | POA: Diagnosis not present

## 2015-10-03 DIAGNOSIS — N2581 Secondary hyperparathyroidism of renal origin: Secondary | ICD-10-CM | POA: Diagnosis not present

## 2015-10-03 DIAGNOSIS — N186 End stage renal disease: Secondary | ICD-10-CM | POA: Diagnosis not present

## 2015-10-03 DIAGNOSIS — D509 Iron deficiency anemia, unspecified: Secondary | ICD-10-CM | POA: Diagnosis not present

## 2015-10-03 DIAGNOSIS — E119 Type 2 diabetes mellitus without complications: Secondary | ICD-10-CM | POA: Diagnosis not present

## 2015-10-03 DIAGNOSIS — D631 Anemia in chronic kidney disease: Secondary | ICD-10-CM | POA: Diagnosis not present

## 2015-10-05 DIAGNOSIS — N2581 Secondary hyperparathyroidism of renal origin: Secondary | ICD-10-CM | POA: Diagnosis not present

## 2015-10-05 DIAGNOSIS — E119 Type 2 diabetes mellitus without complications: Secondary | ICD-10-CM | POA: Diagnosis not present

## 2015-10-05 DIAGNOSIS — D509 Iron deficiency anemia, unspecified: Secondary | ICD-10-CM | POA: Diagnosis not present

## 2015-10-05 DIAGNOSIS — N186 End stage renal disease: Secondary | ICD-10-CM | POA: Diagnosis not present

## 2015-10-05 DIAGNOSIS — D631 Anemia in chronic kidney disease: Secondary | ICD-10-CM | POA: Diagnosis not present

## 2015-10-06 DIAGNOSIS — H5441 Blindness, right eye, normal vision left eye: Secondary | ICD-10-CM | POA: Diagnosis not present

## 2015-10-06 DIAGNOSIS — E113593 Type 2 diabetes mellitus with proliferative diabetic retinopathy without macular edema, bilateral: Secondary | ICD-10-CM | POA: Diagnosis not present

## 2015-10-06 DIAGNOSIS — H33051 Total retinal detachment, right eye: Secondary | ICD-10-CM | POA: Diagnosis not present

## 2015-10-06 DIAGNOSIS — H4720 Unspecified optic atrophy: Secondary | ICD-10-CM | POA: Diagnosis not present

## 2015-10-07 DIAGNOSIS — N186 End stage renal disease: Secondary | ICD-10-CM | POA: Diagnosis not present

## 2015-10-07 DIAGNOSIS — E119 Type 2 diabetes mellitus without complications: Secondary | ICD-10-CM | POA: Diagnosis not present

## 2015-10-07 DIAGNOSIS — Z992 Dependence on renal dialysis: Secondary | ICD-10-CM | POA: Diagnosis not present

## 2015-10-07 DIAGNOSIS — D631 Anemia in chronic kidney disease: Secondary | ICD-10-CM | POA: Diagnosis not present

## 2015-10-07 DIAGNOSIS — D509 Iron deficiency anemia, unspecified: Secondary | ICD-10-CM | POA: Diagnosis not present

## 2015-10-07 DIAGNOSIS — I120 Hypertensive chronic kidney disease with stage 5 chronic kidney disease or end stage renal disease: Secondary | ICD-10-CM | POA: Diagnosis not present

## 2015-10-07 DIAGNOSIS — N2581 Secondary hyperparathyroidism of renal origin: Secondary | ICD-10-CM | POA: Diagnosis not present

## 2015-10-10 DIAGNOSIS — E119 Type 2 diabetes mellitus without complications: Secondary | ICD-10-CM | POA: Diagnosis not present

## 2015-10-10 DIAGNOSIS — D509 Iron deficiency anemia, unspecified: Secondary | ICD-10-CM | POA: Diagnosis not present

## 2015-10-10 DIAGNOSIS — N186 End stage renal disease: Secondary | ICD-10-CM | POA: Diagnosis not present

## 2015-10-10 DIAGNOSIS — D631 Anemia in chronic kidney disease: Secondary | ICD-10-CM | POA: Diagnosis not present

## 2015-10-10 DIAGNOSIS — N2581 Secondary hyperparathyroidism of renal origin: Secondary | ICD-10-CM | POA: Diagnosis not present

## 2015-10-12 DIAGNOSIS — D631 Anemia in chronic kidney disease: Secondary | ICD-10-CM | POA: Diagnosis not present

## 2015-10-12 DIAGNOSIS — N2581 Secondary hyperparathyroidism of renal origin: Secondary | ICD-10-CM | POA: Diagnosis not present

## 2015-10-12 DIAGNOSIS — E119 Type 2 diabetes mellitus without complications: Secondary | ICD-10-CM | POA: Diagnosis not present

## 2015-10-12 DIAGNOSIS — D509 Iron deficiency anemia, unspecified: Secondary | ICD-10-CM | POA: Diagnosis not present

## 2015-10-12 DIAGNOSIS — N186 End stage renal disease: Secondary | ICD-10-CM | POA: Diagnosis not present

## 2015-10-13 ENCOUNTER — Ambulatory Visit (INDEPENDENT_AMBULATORY_CARE_PROVIDER_SITE_OTHER): Payer: Medicare Other | Admitting: Podiatry

## 2015-10-13 ENCOUNTER — Encounter: Payer: Self-pay | Admitting: Podiatry

## 2015-10-13 DIAGNOSIS — E1140 Type 2 diabetes mellitus with diabetic neuropathy, unspecified: Secondary | ICD-10-CM | POA: Diagnosis not present

## 2015-10-13 DIAGNOSIS — B351 Tinea unguium: Secondary | ICD-10-CM

## 2015-10-13 DIAGNOSIS — M79673 Pain in unspecified foot: Secondary | ICD-10-CM | POA: Diagnosis not present

## 2015-10-13 NOTE — Progress Notes (Signed)
Patient ID: Shawn [name], male   DOB: 01-30-1961, 55 y.o.   MRN: [id number] Complaint:  Visit Type: Patient returns to my office for continued preventative foot care services. Complaint: Patient states" my nails have grown long and thick and become painful to walk and wear shoes" Patient has been diagnosed with DM with neuropathy and angiopathy.. The patient presents for preventative foot care services. No changes to ROS  Podiatric Exam: Vascular: dorsalis pedis and posterior tibial pulses are palpable bilateral. Capillary return is immediate. Temperature gradient is WNL. Skin turgor WNL  Sensorium: Diminished Semmes Weinstein monofilament test. Normal tactile sensation bilaterally. Nail Exam: Pt has thick disfigured discolored nails with subungual debris noted bilateral entire nail hallux through fifth toenails Ulcer Exam: There is no evidence of ulcer or pre-ulcerative changes or infection. Orthopedic Exam: Muscle tone and strength are WNL. No limitations in general ROM. No crepitus or effusions noted. Foot type and digits show no abnormalities. Bony prominences are unremarkable. Skin: No Porokeratosis. No infection or ulcers.  Dried blister covering that is peeling on left hallux.  No signs of redness or infection or swelling.  Diagnosis:  Onychomycosis, , Pain in right toe, pain in left toes  Treatment & Plan Procedures and Treatment: Consent by patient was obtained for treatment procedures. The patient understood the discussion of treatment and procedures well. All questions were answered thoroughly reviewed. Debridement of mycotic and hypertrophic toenails, 1 through 5 bilateral and clearing of subungual debris. No ulceration, no infection noted.  Return Visit-Office Procedure: Patient instructed to return to the office for a follow up visit 3 months for continued evaluation and treatment.  Gardiner Barefoot DPM

## 2015-10-14 DIAGNOSIS — N186 End stage renal disease: Secondary | ICD-10-CM | POA: Diagnosis not present

## 2015-10-14 DIAGNOSIS — D631 Anemia in chronic kidney disease: Secondary | ICD-10-CM | POA: Diagnosis not present

## 2015-10-14 DIAGNOSIS — E119 Type 2 diabetes mellitus without complications: Secondary | ICD-10-CM | POA: Diagnosis not present

## 2015-10-14 DIAGNOSIS — D509 Iron deficiency anemia, unspecified: Secondary | ICD-10-CM | POA: Diagnosis not present

## 2015-10-14 DIAGNOSIS — N2581 Secondary hyperparathyroidism of renal origin: Secondary | ICD-10-CM | POA: Diagnosis not present

## 2015-10-17 DIAGNOSIS — N186 End stage renal disease: Secondary | ICD-10-CM | POA: Diagnosis not present

## 2015-10-17 DIAGNOSIS — N2581 Secondary hyperparathyroidism of renal origin: Secondary | ICD-10-CM | POA: Diagnosis not present

## 2015-10-17 DIAGNOSIS — D631 Anemia in chronic kidney disease: Secondary | ICD-10-CM | POA: Diagnosis not present

## 2015-10-17 DIAGNOSIS — D509 Iron deficiency anemia, unspecified: Secondary | ICD-10-CM | POA: Diagnosis not present

## 2015-10-17 DIAGNOSIS — E119 Type 2 diabetes mellitus without complications: Secondary | ICD-10-CM | POA: Diagnosis not present

## 2015-10-19 DIAGNOSIS — E119 Type 2 diabetes mellitus without complications: Secondary | ICD-10-CM | POA: Diagnosis not present

## 2015-10-19 DIAGNOSIS — N186 End stage renal disease: Secondary | ICD-10-CM | POA: Diagnosis not present

## 2015-10-19 DIAGNOSIS — D509 Iron deficiency anemia, unspecified: Secondary | ICD-10-CM | POA: Diagnosis not present

## 2015-10-19 DIAGNOSIS — D631 Anemia in chronic kidney disease: Secondary | ICD-10-CM | POA: Diagnosis not present

## 2015-10-19 DIAGNOSIS — N2581 Secondary hyperparathyroidism of renal origin: Secondary | ICD-10-CM | POA: Diagnosis not present

## 2015-10-21 DIAGNOSIS — N186 End stage renal disease: Secondary | ICD-10-CM | POA: Diagnosis not present

## 2015-10-21 DIAGNOSIS — D631 Anemia in chronic kidney disease: Secondary | ICD-10-CM | POA: Diagnosis not present

## 2015-10-21 DIAGNOSIS — D509 Iron deficiency anemia, unspecified: Secondary | ICD-10-CM | POA: Diagnosis not present

## 2015-10-21 DIAGNOSIS — E119 Type 2 diabetes mellitus without complications: Secondary | ICD-10-CM | POA: Diagnosis not present

## 2015-10-21 DIAGNOSIS — N2581 Secondary hyperparathyroidism of renal origin: Secondary | ICD-10-CM | POA: Diagnosis not present

## 2015-10-24 DIAGNOSIS — D631 Anemia in chronic kidney disease: Secondary | ICD-10-CM | POA: Diagnosis not present

## 2015-10-24 DIAGNOSIS — E119 Type 2 diabetes mellitus without complications: Secondary | ICD-10-CM | POA: Diagnosis not present

## 2015-10-24 DIAGNOSIS — N2581 Secondary hyperparathyroidism of renal origin: Secondary | ICD-10-CM | POA: Diagnosis not present

## 2015-10-24 DIAGNOSIS — N186 End stage renal disease: Secondary | ICD-10-CM | POA: Diagnosis not present

## 2015-10-24 DIAGNOSIS — D509 Iron deficiency anemia, unspecified: Secondary | ICD-10-CM | POA: Diagnosis not present

## 2015-10-26 DIAGNOSIS — N186 End stage renal disease: Secondary | ICD-10-CM | POA: Diagnosis not present

## 2015-10-26 DIAGNOSIS — N2581 Secondary hyperparathyroidism of renal origin: Secondary | ICD-10-CM | POA: Diagnosis not present

## 2015-10-26 DIAGNOSIS — D631 Anemia in chronic kidney disease: Secondary | ICD-10-CM | POA: Diagnosis not present

## 2015-10-26 DIAGNOSIS — D509 Iron deficiency anemia, unspecified: Secondary | ICD-10-CM | POA: Diagnosis not present

## 2015-10-26 DIAGNOSIS — E119 Type 2 diabetes mellitus without complications: Secondary | ICD-10-CM | POA: Diagnosis not present

## 2015-10-28 DIAGNOSIS — N2581 Secondary hyperparathyroidism of renal origin: Secondary | ICD-10-CM | POA: Diagnosis not present

## 2015-10-28 DIAGNOSIS — E119 Type 2 diabetes mellitus without complications: Secondary | ICD-10-CM | POA: Diagnosis not present

## 2015-10-28 DIAGNOSIS — D631 Anemia in chronic kidney disease: Secondary | ICD-10-CM | POA: Diagnosis not present

## 2015-10-28 DIAGNOSIS — D509 Iron deficiency anemia, unspecified: Secondary | ICD-10-CM | POA: Diagnosis not present

## 2015-10-28 DIAGNOSIS — N186 End stage renal disease: Secondary | ICD-10-CM | POA: Diagnosis not present

## 2015-10-31 DIAGNOSIS — D631 Anemia in chronic kidney disease: Secondary | ICD-10-CM | POA: Diagnosis not present

## 2015-10-31 DIAGNOSIS — N186 End stage renal disease: Secondary | ICD-10-CM | POA: Diagnosis not present

## 2015-10-31 DIAGNOSIS — N2581 Secondary hyperparathyroidism of renal origin: Secondary | ICD-10-CM | POA: Diagnosis not present

## 2015-10-31 DIAGNOSIS — E119 Type 2 diabetes mellitus without complications: Secondary | ICD-10-CM | POA: Diagnosis not present

## 2015-10-31 DIAGNOSIS — D509 Iron deficiency anemia, unspecified: Secondary | ICD-10-CM | POA: Diagnosis not present

## 2015-11-02 DIAGNOSIS — D509 Iron deficiency anemia, unspecified: Secondary | ICD-10-CM | POA: Diagnosis not present

## 2015-11-02 DIAGNOSIS — E1129 Type 2 diabetes mellitus with other diabetic kidney complication: Secondary | ICD-10-CM | POA: Diagnosis not present

## 2015-11-02 DIAGNOSIS — N186 End stage renal disease: Secondary | ICD-10-CM | POA: Diagnosis not present

## 2015-11-02 DIAGNOSIS — E119 Type 2 diabetes mellitus without complications: Secondary | ICD-10-CM | POA: Diagnosis not present

## 2015-11-02 DIAGNOSIS — D631 Anemia in chronic kidney disease: Secondary | ICD-10-CM | POA: Diagnosis not present

## 2015-11-02 DIAGNOSIS — N2581 Secondary hyperparathyroidism of renal origin: Secondary | ICD-10-CM | POA: Diagnosis not present

## 2015-11-04 DIAGNOSIS — D509 Iron deficiency anemia, unspecified: Secondary | ICD-10-CM | POA: Diagnosis not present

## 2015-11-04 DIAGNOSIS — N186 End stage renal disease: Secondary | ICD-10-CM | POA: Diagnosis not present

## 2015-11-04 DIAGNOSIS — D631 Anemia in chronic kidney disease: Secondary | ICD-10-CM | POA: Diagnosis not present

## 2015-11-04 DIAGNOSIS — N2581 Secondary hyperparathyroidism of renal origin: Secondary | ICD-10-CM | POA: Diagnosis not present

## 2015-11-04 DIAGNOSIS — E119 Type 2 diabetes mellitus without complications: Secondary | ICD-10-CM | POA: Diagnosis not present

## 2015-11-06 DIAGNOSIS — Z992 Dependence on renal dialysis: Secondary | ICD-10-CM | POA: Diagnosis not present

## 2015-11-06 DIAGNOSIS — I120 Hypertensive chronic kidney disease with stage 5 chronic kidney disease or end stage renal disease: Secondary | ICD-10-CM | POA: Diagnosis not present

## 2015-11-06 DIAGNOSIS — N186 End stage renal disease: Secondary | ICD-10-CM | POA: Diagnosis not present

## 2015-11-07 DIAGNOSIS — D631 Anemia in chronic kidney disease: Secondary | ICD-10-CM | POA: Diagnosis not present

## 2015-11-07 DIAGNOSIS — D509 Iron deficiency anemia, unspecified: Secondary | ICD-10-CM | POA: Diagnosis not present

## 2015-11-07 DIAGNOSIS — N2581 Secondary hyperparathyroidism of renal origin: Secondary | ICD-10-CM | POA: Diagnosis not present

## 2015-11-07 DIAGNOSIS — N186 End stage renal disease: Secondary | ICD-10-CM | POA: Diagnosis not present

## 2015-11-07 DIAGNOSIS — E119 Type 2 diabetes mellitus without complications: Secondary | ICD-10-CM | POA: Diagnosis not present

## 2015-11-09 DIAGNOSIS — D631 Anemia in chronic kidney disease: Secondary | ICD-10-CM | POA: Diagnosis not present

## 2015-11-09 DIAGNOSIS — E119 Type 2 diabetes mellitus without complications: Secondary | ICD-10-CM | POA: Diagnosis not present

## 2015-11-09 DIAGNOSIS — N2581 Secondary hyperparathyroidism of renal origin: Secondary | ICD-10-CM | POA: Diagnosis not present

## 2015-11-09 DIAGNOSIS — D509 Iron deficiency anemia, unspecified: Secondary | ICD-10-CM | POA: Diagnosis not present

## 2015-11-09 DIAGNOSIS — N186 End stage renal disease: Secondary | ICD-10-CM | POA: Diagnosis not present

## 2015-11-11 DIAGNOSIS — D509 Iron deficiency anemia, unspecified: Secondary | ICD-10-CM | POA: Diagnosis not present

## 2015-11-11 DIAGNOSIS — N2581 Secondary hyperparathyroidism of renal origin: Secondary | ICD-10-CM | POA: Diagnosis not present

## 2015-11-11 DIAGNOSIS — N186 End stage renal disease: Secondary | ICD-10-CM | POA: Diagnosis not present

## 2015-11-11 DIAGNOSIS — E119 Type 2 diabetes mellitus without complications: Secondary | ICD-10-CM | POA: Diagnosis not present

## 2015-11-11 DIAGNOSIS — D631 Anemia in chronic kidney disease: Secondary | ICD-10-CM | POA: Diagnosis not present

## 2015-11-14 DIAGNOSIS — E119 Type 2 diabetes mellitus without complications: Secondary | ICD-10-CM | POA: Diagnosis not present

## 2015-11-14 DIAGNOSIS — N2581 Secondary hyperparathyroidism of renal origin: Secondary | ICD-10-CM | POA: Diagnosis not present

## 2015-11-14 DIAGNOSIS — D631 Anemia in chronic kidney disease: Secondary | ICD-10-CM | POA: Diagnosis not present

## 2015-11-14 DIAGNOSIS — N186 End stage renal disease: Secondary | ICD-10-CM | POA: Diagnosis not present

## 2015-11-14 DIAGNOSIS — D509 Iron deficiency anemia, unspecified: Secondary | ICD-10-CM | POA: Diagnosis not present

## 2015-11-15 DIAGNOSIS — E11319 Type 2 diabetes mellitus with unspecified diabetic retinopathy without macular edema: Secondary | ICD-10-CM | POA: Diagnosis not present

## 2015-11-15 DIAGNOSIS — Z79899 Other long term (current) drug therapy: Secondary | ICD-10-CM | POA: Diagnosis not present

## 2015-11-15 DIAGNOSIS — I119 Hypertensive heart disease without heart failure: Secondary | ICD-10-CM | POA: Diagnosis not present

## 2015-11-15 DIAGNOSIS — E559 Vitamin D deficiency, unspecified: Secondary | ICD-10-CM | POA: Diagnosis not present

## 2015-11-15 DIAGNOSIS — E113599 Type 2 diabetes mellitus with proliferative diabetic retinopathy without macular edema, unspecified eye: Secondary | ICD-10-CM | POA: Diagnosis not present

## 2015-11-15 DIAGNOSIS — E7800 Pure hypercholesterolemia, unspecified: Secondary | ICD-10-CM | POA: Diagnosis not present

## 2015-11-15 DIAGNOSIS — I6990 Unspecified sequelae of unspecified cerebrovascular disease: Secondary | ICD-10-CM | POA: Diagnosis not present

## 2015-11-15 DIAGNOSIS — H548 Legal blindness, as defined in USA: Secondary | ICD-10-CM | POA: Diagnosis not present

## 2015-11-15 DIAGNOSIS — I2510 Atherosclerotic heart disease of native coronary artery without angina pectoris: Secondary | ICD-10-CM | POA: Diagnosis not present

## 2015-11-15 DIAGNOSIS — Z125 Encounter for screening for malignant neoplasm of prostate: Secondary | ICD-10-CM | POA: Diagnosis not present

## 2015-11-15 DIAGNOSIS — E1165 Type 2 diabetes mellitus with hyperglycemia: Secondary | ICD-10-CM | POA: Diagnosis not present

## 2015-11-16 DIAGNOSIS — N186 End stage renal disease: Secondary | ICD-10-CM | POA: Diagnosis not present

## 2015-11-16 DIAGNOSIS — D631 Anemia in chronic kidney disease: Secondary | ICD-10-CM | POA: Diagnosis not present

## 2015-11-16 DIAGNOSIS — N2581 Secondary hyperparathyroidism of renal origin: Secondary | ICD-10-CM | POA: Diagnosis not present

## 2015-11-16 DIAGNOSIS — E119 Type 2 diabetes mellitus without complications: Secondary | ICD-10-CM | POA: Diagnosis not present

## 2015-11-16 DIAGNOSIS — D509 Iron deficiency anemia, unspecified: Secondary | ICD-10-CM | POA: Diagnosis not present

## 2015-11-18 DIAGNOSIS — N186 End stage renal disease: Secondary | ICD-10-CM | POA: Diagnosis not present

## 2015-11-18 DIAGNOSIS — N2581 Secondary hyperparathyroidism of renal origin: Secondary | ICD-10-CM | POA: Diagnosis not present

## 2015-11-18 DIAGNOSIS — D631 Anemia in chronic kidney disease: Secondary | ICD-10-CM | POA: Diagnosis not present

## 2015-11-18 DIAGNOSIS — D509 Iron deficiency anemia, unspecified: Secondary | ICD-10-CM | POA: Diagnosis not present

## 2015-11-18 DIAGNOSIS — E119 Type 2 diabetes mellitus without complications: Secondary | ICD-10-CM | POA: Diagnosis not present

## 2015-11-21 DIAGNOSIS — D631 Anemia in chronic kidney disease: Secondary | ICD-10-CM | POA: Diagnosis not present

## 2015-11-21 DIAGNOSIS — N2581 Secondary hyperparathyroidism of renal origin: Secondary | ICD-10-CM | POA: Diagnosis not present

## 2015-11-21 DIAGNOSIS — D509 Iron deficiency anemia, unspecified: Secondary | ICD-10-CM | POA: Diagnosis not present

## 2015-11-21 DIAGNOSIS — E119 Type 2 diabetes mellitus without complications: Secondary | ICD-10-CM | POA: Diagnosis not present

## 2015-11-21 DIAGNOSIS — N186 End stage renal disease: Secondary | ICD-10-CM | POA: Diagnosis not present

## 2015-11-23 DIAGNOSIS — N186 End stage renal disease: Secondary | ICD-10-CM | POA: Diagnosis not present

## 2015-11-23 DIAGNOSIS — D509 Iron deficiency anemia, unspecified: Secondary | ICD-10-CM | POA: Diagnosis not present

## 2015-11-23 DIAGNOSIS — D631 Anemia in chronic kidney disease: Secondary | ICD-10-CM | POA: Diagnosis not present

## 2015-11-23 DIAGNOSIS — N2581 Secondary hyperparathyroidism of renal origin: Secondary | ICD-10-CM | POA: Diagnosis not present

## 2015-11-23 DIAGNOSIS — E119 Type 2 diabetes mellitus without complications: Secondary | ICD-10-CM | POA: Diagnosis not present

## 2015-11-24 DIAGNOSIS — H401123 Primary open-angle glaucoma, left eye, severe stage: Secondary | ICD-10-CM | POA: Diagnosis not present

## 2015-11-24 DIAGNOSIS — Z961 Presence of intraocular lens: Secondary | ICD-10-CM | POA: Diagnosis not present

## 2015-11-25 DIAGNOSIS — E119 Type 2 diabetes mellitus without complications: Secondary | ICD-10-CM | POA: Diagnosis not present

## 2015-11-25 DIAGNOSIS — N2581 Secondary hyperparathyroidism of renal origin: Secondary | ICD-10-CM | POA: Diagnosis not present

## 2015-11-25 DIAGNOSIS — D509 Iron deficiency anemia, unspecified: Secondary | ICD-10-CM | POA: Diagnosis not present

## 2015-11-25 DIAGNOSIS — N186 End stage renal disease: Secondary | ICD-10-CM | POA: Diagnosis not present

## 2015-11-25 DIAGNOSIS — D631 Anemia in chronic kidney disease: Secondary | ICD-10-CM | POA: Diagnosis not present

## 2015-11-28 DIAGNOSIS — D631 Anemia in chronic kidney disease: Secondary | ICD-10-CM | POA: Diagnosis not present

## 2015-11-28 DIAGNOSIS — N2581 Secondary hyperparathyroidism of renal origin: Secondary | ICD-10-CM | POA: Diagnosis not present

## 2015-11-28 DIAGNOSIS — N186 End stage renal disease: Secondary | ICD-10-CM | POA: Diagnosis not present

## 2015-11-28 DIAGNOSIS — D509 Iron deficiency anemia, unspecified: Secondary | ICD-10-CM | POA: Diagnosis not present

## 2015-11-28 DIAGNOSIS — E119 Type 2 diabetes mellitus without complications: Secondary | ICD-10-CM | POA: Diagnosis not present

## 2015-11-30 DIAGNOSIS — D509 Iron deficiency anemia, unspecified: Secondary | ICD-10-CM | POA: Diagnosis not present

## 2015-11-30 DIAGNOSIS — N2581 Secondary hyperparathyroidism of renal origin: Secondary | ICD-10-CM | POA: Diagnosis not present

## 2015-11-30 DIAGNOSIS — E119 Type 2 diabetes mellitus without complications: Secondary | ICD-10-CM | POA: Diagnosis not present

## 2015-11-30 DIAGNOSIS — N186 End stage renal disease: Secondary | ICD-10-CM | POA: Diagnosis not present

## 2015-11-30 DIAGNOSIS — D631 Anemia in chronic kidney disease: Secondary | ICD-10-CM | POA: Diagnosis not present

## 2015-12-02 DIAGNOSIS — E119 Type 2 diabetes mellitus without complications: Secondary | ICD-10-CM | POA: Diagnosis not present

## 2015-12-02 DIAGNOSIS — D509 Iron deficiency anemia, unspecified: Secondary | ICD-10-CM | POA: Diagnosis not present

## 2015-12-02 DIAGNOSIS — N186 End stage renal disease: Secondary | ICD-10-CM | POA: Diagnosis not present

## 2015-12-02 DIAGNOSIS — D631 Anemia in chronic kidney disease: Secondary | ICD-10-CM | POA: Diagnosis not present

## 2015-12-02 DIAGNOSIS — N2581 Secondary hyperparathyroidism of renal origin: Secondary | ICD-10-CM | POA: Diagnosis not present

## 2015-12-05 DIAGNOSIS — N186 End stage renal disease: Secondary | ICD-10-CM | POA: Diagnosis not present

## 2015-12-05 DIAGNOSIS — D631 Anemia in chronic kidney disease: Secondary | ICD-10-CM | POA: Diagnosis not present

## 2015-12-05 DIAGNOSIS — E119 Type 2 diabetes mellitus without complications: Secondary | ICD-10-CM | POA: Diagnosis not present

## 2015-12-05 DIAGNOSIS — D509 Iron deficiency anemia, unspecified: Secondary | ICD-10-CM | POA: Diagnosis not present

## 2015-12-05 DIAGNOSIS — N2581 Secondary hyperparathyroidism of renal origin: Secondary | ICD-10-CM | POA: Diagnosis not present

## 2015-12-07 DIAGNOSIS — Z992 Dependence on renal dialysis: Secondary | ICD-10-CM | POA: Diagnosis not present

## 2015-12-07 DIAGNOSIS — D509 Iron deficiency anemia, unspecified: Secondary | ICD-10-CM | POA: Diagnosis not present

## 2015-12-07 DIAGNOSIS — E119 Type 2 diabetes mellitus without complications: Secondary | ICD-10-CM | POA: Diagnosis not present

## 2015-12-07 DIAGNOSIS — D631 Anemia in chronic kidney disease: Secondary | ICD-10-CM | POA: Diagnosis not present

## 2015-12-07 DIAGNOSIS — I120 Hypertensive chronic kidney disease with stage 5 chronic kidney disease or end stage renal disease: Secondary | ICD-10-CM | POA: Diagnosis not present

## 2015-12-07 DIAGNOSIS — N2581 Secondary hyperparathyroidism of renal origin: Secondary | ICD-10-CM | POA: Diagnosis not present

## 2015-12-07 DIAGNOSIS — N186 End stage renal disease: Secondary | ICD-10-CM | POA: Diagnosis not present

## 2015-12-10 DIAGNOSIS — D631 Anemia in chronic kidney disease: Secondary | ICD-10-CM | POA: Diagnosis not present

## 2015-12-10 DIAGNOSIS — D509 Iron deficiency anemia, unspecified: Secondary | ICD-10-CM | POA: Diagnosis not present

## 2015-12-10 DIAGNOSIS — E119 Type 2 diabetes mellitus without complications: Secondary | ICD-10-CM | POA: Diagnosis not present

## 2015-12-10 DIAGNOSIS — N186 End stage renal disease: Secondary | ICD-10-CM | POA: Diagnosis not present

## 2015-12-10 DIAGNOSIS — T8249XA Other complication of vascular dialysis catheter, initial encounter: Secondary | ICD-10-CM | POA: Diagnosis not present

## 2015-12-10 DIAGNOSIS — N2581 Secondary hyperparathyroidism of renal origin: Secondary | ICD-10-CM | POA: Diagnosis not present

## 2015-12-12 DIAGNOSIS — N186 End stage renal disease: Secondary | ICD-10-CM | POA: Diagnosis not present

## 2015-12-12 DIAGNOSIS — E119 Type 2 diabetes mellitus without complications: Secondary | ICD-10-CM | POA: Diagnosis not present

## 2015-12-12 DIAGNOSIS — D509 Iron deficiency anemia, unspecified: Secondary | ICD-10-CM | POA: Diagnosis not present

## 2015-12-12 DIAGNOSIS — N2581 Secondary hyperparathyroidism of renal origin: Secondary | ICD-10-CM | POA: Diagnosis not present

## 2015-12-12 DIAGNOSIS — D631 Anemia in chronic kidney disease: Secondary | ICD-10-CM | POA: Diagnosis not present

## 2015-12-12 DIAGNOSIS — T8249XA Other complication of vascular dialysis catheter, initial encounter: Secondary | ICD-10-CM | POA: Diagnosis not present

## 2015-12-14 DIAGNOSIS — T8249XA Other complication of vascular dialysis catheter, initial encounter: Secondary | ICD-10-CM | POA: Diagnosis not present

## 2015-12-14 DIAGNOSIS — D509 Iron deficiency anemia, unspecified: Secondary | ICD-10-CM | POA: Diagnosis not present

## 2015-12-14 DIAGNOSIS — T829XXA Unspecified complication of cardiac and vascular prosthetic device, implant and graft, initial encounter: Secondary | ICD-10-CM | POA: Insufficient documentation

## 2015-12-14 DIAGNOSIS — N2581 Secondary hyperparathyroidism of renal origin: Secondary | ICD-10-CM | POA: Diagnosis not present

## 2015-12-14 DIAGNOSIS — E119 Type 2 diabetes mellitus without complications: Secondary | ICD-10-CM | POA: Diagnosis not present

## 2015-12-14 DIAGNOSIS — N186 End stage renal disease: Secondary | ICD-10-CM | POA: Diagnosis not present

## 2015-12-14 DIAGNOSIS — D631 Anemia in chronic kidney disease: Secondary | ICD-10-CM | POA: Diagnosis not present

## 2015-12-16 DIAGNOSIS — N2581 Secondary hyperparathyroidism of renal origin: Secondary | ICD-10-CM | POA: Diagnosis not present

## 2015-12-16 DIAGNOSIS — E119 Type 2 diabetes mellitus without complications: Secondary | ICD-10-CM | POA: Diagnosis not present

## 2015-12-16 DIAGNOSIS — D509 Iron deficiency anemia, unspecified: Secondary | ICD-10-CM | POA: Diagnosis not present

## 2015-12-16 DIAGNOSIS — N186 End stage renal disease: Secondary | ICD-10-CM | POA: Diagnosis not present

## 2015-12-16 DIAGNOSIS — T8249XA Other complication of vascular dialysis catheter, initial encounter: Secondary | ICD-10-CM | POA: Diagnosis not present

## 2015-12-16 DIAGNOSIS — D631 Anemia in chronic kidney disease: Secondary | ICD-10-CM | POA: Diagnosis not present

## 2015-12-19 DIAGNOSIS — T8249XA Other complication of vascular dialysis catheter, initial encounter: Secondary | ICD-10-CM | POA: Diagnosis not present

## 2015-12-19 DIAGNOSIS — N186 End stage renal disease: Secondary | ICD-10-CM | POA: Diagnosis not present

## 2015-12-19 DIAGNOSIS — N2581 Secondary hyperparathyroidism of renal origin: Secondary | ICD-10-CM | POA: Diagnosis not present

## 2015-12-19 DIAGNOSIS — D631 Anemia in chronic kidney disease: Secondary | ICD-10-CM | POA: Diagnosis not present

## 2015-12-19 DIAGNOSIS — E119 Type 2 diabetes mellitus without complications: Secondary | ICD-10-CM | POA: Diagnosis not present

## 2015-12-19 DIAGNOSIS — D509 Iron deficiency anemia, unspecified: Secondary | ICD-10-CM | POA: Diagnosis not present

## 2015-12-21 DIAGNOSIS — D631 Anemia in chronic kidney disease: Secondary | ICD-10-CM | POA: Diagnosis not present

## 2015-12-21 DIAGNOSIS — E119 Type 2 diabetes mellitus without complications: Secondary | ICD-10-CM | POA: Diagnosis not present

## 2015-12-21 DIAGNOSIS — D509 Iron deficiency anemia, unspecified: Secondary | ICD-10-CM | POA: Diagnosis not present

## 2015-12-21 DIAGNOSIS — T8249XA Other complication of vascular dialysis catheter, initial encounter: Secondary | ICD-10-CM | POA: Diagnosis not present

## 2015-12-21 DIAGNOSIS — N2581 Secondary hyperparathyroidism of renal origin: Secondary | ICD-10-CM | POA: Diagnosis not present

## 2015-12-21 DIAGNOSIS — N186 End stage renal disease: Secondary | ICD-10-CM | POA: Diagnosis not present

## 2015-12-23 DIAGNOSIS — T8249XA Other complication of vascular dialysis catheter, initial encounter: Secondary | ICD-10-CM | POA: Diagnosis not present

## 2015-12-23 DIAGNOSIS — D509 Iron deficiency anemia, unspecified: Secondary | ICD-10-CM | POA: Diagnosis not present

## 2015-12-23 DIAGNOSIS — N186 End stage renal disease: Secondary | ICD-10-CM | POA: Diagnosis not present

## 2015-12-23 DIAGNOSIS — E119 Type 2 diabetes mellitus without complications: Secondary | ICD-10-CM | POA: Diagnosis not present

## 2015-12-23 DIAGNOSIS — D631 Anemia in chronic kidney disease: Secondary | ICD-10-CM | POA: Diagnosis not present

## 2015-12-23 DIAGNOSIS — N2581 Secondary hyperparathyroidism of renal origin: Secondary | ICD-10-CM | POA: Diagnosis not present

## 2015-12-26 DIAGNOSIS — N186 End stage renal disease: Secondary | ICD-10-CM | POA: Diagnosis not present

## 2015-12-26 DIAGNOSIS — D631 Anemia in chronic kidney disease: Secondary | ICD-10-CM | POA: Diagnosis not present

## 2015-12-26 DIAGNOSIS — T8249XA Other complication of vascular dialysis catheter, initial encounter: Secondary | ICD-10-CM | POA: Diagnosis not present

## 2015-12-26 DIAGNOSIS — D509 Iron deficiency anemia, unspecified: Secondary | ICD-10-CM | POA: Diagnosis not present

## 2015-12-26 DIAGNOSIS — E119 Type 2 diabetes mellitus without complications: Secondary | ICD-10-CM | POA: Diagnosis not present

## 2015-12-26 DIAGNOSIS — N2581 Secondary hyperparathyroidism of renal origin: Secondary | ICD-10-CM | POA: Diagnosis not present

## 2015-12-28 DIAGNOSIS — N186 End stage renal disease: Secondary | ICD-10-CM | POA: Diagnosis not present

## 2015-12-28 DIAGNOSIS — D631 Anemia in chronic kidney disease: Secondary | ICD-10-CM | POA: Diagnosis not present

## 2015-12-28 DIAGNOSIS — E119 Type 2 diabetes mellitus without complications: Secondary | ICD-10-CM | POA: Diagnosis not present

## 2015-12-28 DIAGNOSIS — N2581 Secondary hyperparathyroidism of renal origin: Secondary | ICD-10-CM | POA: Diagnosis not present

## 2015-12-28 DIAGNOSIS — D509 Iron deficiency anemia, unspecified: Secondary | ICD-10-CM | POA: Diagnosis not present

## 2015-12-28 DIAGNOSIS — T8249XA Other complication of vascular dialysis catheter, initial encounter: Secondary | ICD-10-CM | POA: Diagnosis not present

## 2015-12-30 DIAGNOSIS — N186 End stage renal disease: Secondary | ICD-10-CM | POA: Diagnosis not present

## 2015-12-30 DIAGNOSIS — T8249XA Other complication of vascular dialysis catheter, initial encounter: Secondary | ICD-10-CM | POA: Diagnosis not present

## 2015-12-30 DIAGNOSIS — N2581 Secondary hyperparathyroidism of renal origin: Secondary | ICD-10-CM | POA: Diagnosis not present

## 2015-12-30 DIAGNOSIS — D509 Iron deficiency anemia, unspecified: Secondary | ICD-10-CM | POA: Diagnosis not present

## 2015-12-30 DIAGNOSIS — D631 Anemia in chronic kidney disease: Secondary | ICD-10-CM | POA: Diagnosis not present

## 2015-12-30 DIAGNOSIS — E119 Type 2 diabetes mellitus without complications: Secondary | ICD-10-CM | POA: Diagnosis not present

## 2016-01-02 DIAGNOSIS — D509 Iron deficiency anemia, unspecified: Secondary | ICD-10-CM | POA: Diagnosis not present

## 2016-01-02 DIAGNOSIS — T8249XA Other complication of vascular dialysis catheter, initial encounter: Secondary | ICD-10-CM | POA: Diagnosis not present

## 2016-01-02 DIAGNOSIS — N186 End stage renal disease: Secondary | ICD-10-CM | POA: Diagnosis not present

## 2016-01-02 DIAGNOSIS — N2581 Secondary hyperparathyroidism of renal origin: Secondary | ICD-10-CM | POA: Diagnosis not present

## 2016-01-02 DIAGNOSIS — D631 Anemia in chronic kidney disease: Secondary | ICD-10-CM | POA: Diagnosis not present

## 2016-01-02 DIAGNOSIS — E119 Type 2 diabetes mellitus without complications: Secondary | ICD-10-CM | POA: Diagnosis not present

## 2016-01-04 DIAGNOSIS — D631 Anemia in chronic kidney disease: Secondary | ICD-10-CM | POA: Diagnosis not present

## 2016-01-04 DIAGNOSIS — T8249XA Other complication of vascular dialysis catheter, initial encounter: Secondary | ICD-10-CM | POA: Diagnosis not present

## 2016-01-04 DIAGNOSIS — N2581 Secondary hyperparathyroidism of renal origin: Secondary | ICD-10-CM | POA: Diagnosis not present

## 2016-01-04 DIAGNOSIS — E119 Type 2 diabetes mellitus without complications: Secondary | ICD-10-CM | POA: Diagnosis not present

## 2016-01-04 DIAGNOSIS — D509 Iron deficiency anemia, unspecified: Secondary | ICD-10-CM | POA: Diagnosis not present

## 2016-01-04 DIAGNOSIS — N186 End stage renal disease: Secondary | ICD-10-CM | POA: Diagnosis not present

## 2016-01-05 ENCOUNTER — Ambulatory Visit: Payer: BLUE CROSS/BLUE SHIELD | Admitting: Podiatry

## 2016-01-06 DIAGNOSIS — D509 Iron deficiency anemia, unspecified: Secondary | ICD-10-CM | POA: Diagnosis not present

## 2016-01-06 DIAGNOSIS — N2581 Secondary hyperparathyroidism of renal origin: Secondary | ICD-10-CM | POA: Diagnosis not present

## 2016-01-06 DIAGNOSIS — I120 Hypertensive chronic kidney disease with stage 5 chronic kidney disease or end stage renal disease: Secondary | ICD-10-CM | POA: Diagnosis not present

## 2016-01-06 DIAGNOSIS — E119 Type 2 diabetes mellitus without complications: Secondary | ICD-10-CM | POA: Diagnosis not present

## 2016-01-06 DIAGNOSIS — N186 End stage renal disease: Secondary | ICD-10-CM | POA: Diagnosis not present

## 2016-01-06 DIAGNOSIS — D631 Anemia in chronic kidney disease: Secondary | ICD-10-CM | POA: Diagnosis not present

## 2016-01-06 DIAGNOSIS — Z992 Dependence on renal dialysis: Secondary | ICD-10-CM | POA: Diagnosis not present

## 2016-01-06 DIAGNOSIS — T8249XA Other complication of vascular dialysis catheter, initial encounter: Secondary | ICD-10-CM | POA: Diagnosis not present

## 2016-01-09 DIAGNOSIS — D631 Anemia in chronic kidney disease: Secondary | ICD-10-CM | POA: Diagnosis not present

## 2016-01-09 DIAGNOSIS — N2581 Secondary hyperparathyroidism of renal origin: Secondary | ICD-10-CM | POA: Diagnosis not present

## 2016-01-09 DIAGNOSIS — D509 Iron deficiency anemia, unspecified: Secondary | ICD-10-CM | POA: Diagnosis not present

## 2016-01-09 DIAGNOSIS — E119 Type 2 diabetes mellitus without complications: Secondary | ICD-10-CM | POA: Diagnosis not present

## 2016-01-09 DIAGNOSIS — N186 End stage renal disease: Secondary | ICD-10-CM | POA: Diagnosis not present

## 2016-01-11 DIAGNOSIS — D631 Anemia in chronic kidney disease: Secondary | ICD-10-CM | POA: Diagnosis not present

## 2016-01-11 DIAGNOSIS — N186 End stage renal disease: Secondary | ICD-10-CM | POA: Diagnosis not present

## 2016-01-11 DIAGNOSIS — E119 Type 2 diabetes mellitus without complications: Secondary | ICD-10-CM | POA: Diagnosis not present

## 2016-01-11 DIAGNOSIS — D509 Iron deficiency anemia, unspecified: Secondary | ICD-10-CM | POA: Diagnosis not present

## 2016-01-11 DIAGNOSIS — N2581 Secondary hyperparathyroidism of renal origin: Secondary | ICD-10-CM | POA: Diagnosis not present

## 2016-01-12 ENCOUNTER — Encounter: Payer: Self-pay | Admitting: Podiatry

## 2016-01-12 ENCOUNTER — Ambulatory Visit (INDEPENDENT_AMBULATORY_CARE_PROVIDER_SITE_OTHER): Payer: Medicare Other | Admitting: Podiatry

## 2016-01-12 DIAGNOSIS — B351 Tinea unguium: Secondary | ICD-10-CM | POA: Diagnosis not present

## 2016-01-12 DIAGNOSIS — E1140 Type 2 diabetes mellitus with diabetic neuropathy, unspecified: Secondary | ICD-10-CM

## 2016-01-12 DIAGNOSIS — M79673 Pain in unspecified foot: Secondary | ICD-10-CM

## 2016-01-12 NOTE — Progress Notes (Signed)
Patient ID: Shawn [name], male   DOB: 07/22/1960, 55 y.o.   MRN: [id number] Complaint:  Visit Type: Patient returns to my office for continued preventative foot care services. Complaint: Patient states" my nails have grown long and thick and become painful to walk and wear shoes" Patient has been diagnosed with DM with neuropathy and angiopathy.. The patient presents for preventative foot care services. No changes to ROS  Podiatric Exam: Vascular: dorsalis pedis and posterior tibial pulses are palpable bilateral. Capillary return is immediate. Temperature gradient is WNL. Skin turgor WNL  Sensorium: Diminished Semmes Weinstein monofilament test. Normal tactile sensation bilaterally. Nail Exam: Pt has thick disfigured discolored nails with subungual debris noted bilateral entire nail hallux through fifth toenails Ulcer Exam: There is no evidence of ulcer or pre-ulcerative changes or infection. Orthopedic Exam: Muscle tone and strength are WNL. No limitations in general ROM. No crepitus or effusions noted. Foot type and digits show no abnormalities. Bony prominences are unremarkable. Skin: No Porokeratosis. No infection or ulcers.  Healing skin lesion left hallux.  No signs of redness or infection or swelling.  Diagnosis:  Onychomycosis, , Pain in right toe, pain in left toes  Treatment & Plan Procedures and Treatment: Consent by patient was obtained for treatment procedures. The patient understood the discussion of treatment and procedures well. All questions were answered thoroughly reviewed. Debridement of mycotic and hypertrophic toenails, 1 through 5 bilateral and clearing of subungual debris. No ulceration, no infection noted.  Return Visit-Office Procedure: Patient instructed to return to the office for a follow up visit 3 months for continued evaluation and treatment.  Gardiner Barefoot DPM

## 2016-01-13 DIAGNOSIS — N186 End stage renal disease: Secondary | ICD-10-CM | POA: Diagnosis not present

## 2016-01-13 DIAGNOSIS — D631 Anemia in chronic kidney disease: Secondary | ICD-10-CM | POA: Diagnosis not present

## 2016-01-13 DIAGNOSIS — D509 Iron deficiency anemia, unspecified: Secondary | ICD-10-CM | POA: Diagnosis not present

## 2016-01-13 DIAGNOSIS — E119 Type 2 diabetes mellitus without complications: Secondary | ICD-10-CM | POA: Diagnosis not present

## 2016-01-13 DIAGNOSIS — N2581 Secondary hyperparathyroidism of renal origin: Secondary | ICD-10-CM | POA: Diagnosis not present

## 2016-01-16 DIAGNOSIS — E119 Type 2 diabetes mellitus without complications: Secondary | ICD-10-CM | POA: Diagnosis not present

## 2016-01-16 DIAGNOSIS — N186 End stage renal disease: Secondary | ICD-10-CM | POA: Diagnosis not present

## 2016-01-16 DIAGNOSIS — N2581 Secondary hyperparathyroidism of renal origin: Secondary | ICD-10-CM | POA: Diagnosis not present

## 2016-01-16 DIAGNOSIS — D509 Iron deficiency anemia, unspecified: Secondary | ICD-10-CM | POA: Diagnosis not present

## 2016-01-16 DIAGNOSIS — D631 Anemia in chronic kidney disease: Secondary | ICD-10-CM | POA: Diagnosis not present

## 2016-01-18 DIAGNOSIS — N186 End stage renal disease: Secondary | ICD-10-CM | POA: Diagnosis not present

## 2016-01-18 DIAGNOSIS — N2581 Secondary hyperparathyroidism of renal origin: Secondary | ICD-10-CM | POA: Diagnosis not present

## 2016-01-18 DIAGNOSIS — D509 Iron deficiency anemia, unspecified: Secondary | ICD-10-CM | POA: Diagnosis not present

## 2016-01-18 DIAGNOSIS — D631 Anemia in chronic kidney disease: Secondary | ICD-10-CM | POA: Diagnosis not present

## 2016-01-18 DIAGNOSIS — E119 Type 2 diabetes mellitus without complications: Secondary | ICD-10-CM | POA: Diagnosis not present

## 2016-01-20 DIAGNOSIS — D509 Iron deficiency anemia, unspecified: Secondary | ICD-10-CM | POA: Diagnosis not present

## 2016-01-20 DIAGNOSIS — E119 Type 2 diabetes mellitus without complications: Secondary | ICD-10-CM | POA: Diagnosis not present

## 2016-01-20 DIAGNOSIS — N186 End stage renal disease: Secondary | ICD-10-CM | POA: Diagnosis not present

## 2016-01-20 DIAGNOSIS — N2581 Secondary hyperparathyroidism of renal origin: Secondary | ICD-10-CM | POA: Diagnosis not present

## 2016-01-20 DIAGNOSIS — D631 Anemia in chronic kidney disease: Secondary | ICD-10-CM | POA: Diagnosis not present

## 2016-01-23 ENCOUNTER — Encounter (HOSPITAL_COMMUNITY): Payer: Self-pay

## 2016-01-23 ENCOUNTER — Observation Stay (HOSPITAL_COMMUNITY)
Admission: EM | Admit: 2016-01-23 | Discharge: 2016-01-24 | Disposition: A | Discharge status: Home / Self Care | Payer: Medicare Other | Attending: Internal Medicine | Admitting: Internal Medicine

## 2016-01-23 ENCOUNTER — Emergency Department (HOSPITAL_COMMUNITY): Payer: Medicare Other

## 2016-01-23 DIAGNOSIS — N186 End stage renal disease: Secondary | ICD-10-CM | POA: Diagnosis not present

## 2016-01-23 DIAGNOSIS — Z7982 Long term (current) use of aspirin: Secondary | ICD-10-CM | POA: Diagnosis not present

## 2016-01-23 DIAGNOSIS — I10 Essential (primary) hypertension: Secondary | ICD-10-CM | POA: Diagnosis present

## 2016-01-23 DIAGNOSIS — N189 Chronic kidney disease, unspecified: Secondary | ICD-10-CM | POA: Diagnosis not present

## 2016-01-23 DIAGNOSIS — H548 Legal blindness, as defined in USA: Secondary | ICD-10-CM

## 2016-01-23 DIAGNOSIS — Z79899 Other long term (current) drug therapy: Secondary | ICD-10-CM | POA: Insufficient documentation

## 2016-01-23 DIAGNOSIS — I951 Orthostatic hypotension: Secondary | ICD-10-CM

## 2016-01-23 DIAGNOSIS — I129 Hypertensive chronic kidney disease with stage 1 through stage 4 chronic kidney disease, or unspecified chronic kidney disease: Secondary | ICD-10-CM | POA: Insufficient documentation

## 2016-01-23 DIAGNOSIS — E1122 Type 2 diabetes mellitus with diabetic chronic kidney disease: Secondary | ICD-10-CM | POA: Insufficient documentation

## 2016-01-23 DIAGNOSIS — R031 Nonspecific low blood-pressure reading: Secondary | ICD-10-CM | POA: Diagnosis not present

## 2016-01-23 DIAGNOSIS — Z794 Long term (current) use of insulin: Secondary | ICD-10-CM | POA: Diagnosis not present

## 2016-01-23 DIAGNOSIS — N2581 Secondary hyperparathyroidism of renal origin: Secondary | ICD-10-CM | POA: Diagnosis not present

## 2016-01-23 DIAGNOSIS — R42 Dizziness and giddiness: Secondary | ICD-10-CM | POA: Diagnosis not present

## 2016-01-23 DIAGNOSIS — Z8673 Personal history of transient ischemic attack (TIA), and cerebral infarction without residual deficits: Secondary | ICD-10-CM | POA: Insufficient documentation

## 2016-01-23 DIAGNOSIS — E118 Type 2 diabetes mellitus with unspecified complications: Secondary | ICD-10-CM

## 2016-01-23 DIAGNOSIS — E119 Type 2 diabetes mellitus without complications: Secondary | ICD-10-CM

## 2016-01-23 DIAGNOSIS — D631 Anemia in chronic kidney disease: Secondary | ICD-10-CM | POA: Diagnosis not present

## 2016-01-23 DIAGNOSIS — R55 Syncope and collapse: Principal | ICD-10-CM | POA: Diagnosis present

## 2016-01-23 DIAGNOSIS — D509 Iron deficiency anemia, unspecified: Secondary | ICD-10-CM | POA: Diagnosis not present

## 2016-01-23 HISTORY — DX: Syncope and collapse: R55

## 2016-01-23 LAB — CBC
HEMATOCRIT: 44.5 % (ref 39.0–52.0)
HEMOGLOBIN: 14.1 g/dL (ref 13.0–17.0)
MCH: 29.4 pg (ref 26.0–34.0)
MCHC: 31.7 g/dL (ref 30.0–36.0)
MCV: 92.7 fL (ref 78.0–100.0)
Platelets: 240 10*3/uL (ref 150–400)
RBC: 4.8 MIL/uL (ref 4.22–5.81)
RDW: 14.9 % (ref 11.5–15.5)
WBC: 7.3 10*3/uL (ref 4.0–10.5)

## 2016-01-23 LAB — BASIC METABOLIC PANEL
Anion gap: 12 (ref 5–15)
BUN: 16 mg/dL (ref 6–20)
CO2: 26 mmol/L (ref 22–32)
Calcium: 8.6 mg/dL — ABNORMAL LOW (ref 8.9–10.3)
Chloride: 95 mmol/L — ABNORMAL LOW (ref 101–111)
Creatinine, Ser: 6.8 mg/dL — ABNORMAL HIGH (ref 0.61–1.24)
GFR, EST AFRICAN AMERICAN: 10 mL/min — AB (ref >60)
GFR, EST NON AFRICAN AMERICAN: 8 mL/min — AB (ref >60)
Glucose, Bld: 190 mg/dL — ABNORMAL HIGH (ref 65–99)
POTASSIUM: 4.5 mmol/L (ref 3.5–5.1)
SODIUM: 133 mmol/L — AB (ref 135–145)

## 2016-01-23 LAB — GLUCOSE, CAPILLARY
GLUCOSE-CAPILLARY: 199 mg/dL — AB (ref 65–99)
Glucose-Capillary: 234 mg/dL — ABNORMAL HIGH (ref 65–99)

## 2016-01-23 LAB — MRSA PCR SCREENING: MRSA by PCR: NEGATIVE

## 2016-01-23 LAB — I-STAT TROPONIN, ED: TROPONIN I, POC: 0 ng/mL (ref 0.00–0.08)

## 2016-01-23 MED ORDER — ONDANSETRON HCL 4 MG PO TABS: 4.0000 mg | ORAL_TABLET | Freq: Four times a day (QID) | ORAL | Status: DC | PRN

## 2016-01-23 MED ORDER — INSULIN ASPART 100 UNIT/ML ~~LOC~~ SOLN
0.0000 [IU] | Freq: Three times a day (TID) | SUBCUTANEOUS | Status: DC
  Administered 2016-01-23: 5 [IU] via SUBCUTANEOUS
  Administered 2016-01-24: 2 [IU] via SUBCUTANEOUS
  Administered 2016-01-24: 3 [IU] via SUBCUTANEOUS

## 2016-01-23 MED ORDER — DORZOLAMIDE HCL-TIMOLOL MAL 2-0.5 % OP SOLN
1.0000 [drp] | Freq: Two times a day (BID) | OPHTHALMIC | Status: DC
  Administered 2016-01-24: 1 [drp] via OPHTHALMIC
  Filled 2016-01-23: qty 10

## 2016-01-23 MED ORDER — ONDANSETRON HCL 4 MG/2ML IJ SOLN
4.0000 mg | Freq: Four times a day (QID) | INTRAMUSCULAR | Status: DC | PRN
Start: 2016-01-23 — End: 2016-01-24

## 2016-01-23 MED ORDER — ASPIRIN 325 MG PO TABS
325.0000 mg | ORAL_TABLET | Freq: Every day | ORAL | Status: DC
  Administered 2016-01-23 – 2016-01-24 (×2): 325 mg via ORAL
  Filled 2016-01-23 (×2): qty 1

## 2016-01-23 MED ORDER — B COMPLEX-C PO TABS
1.0000 | ORAL_TABLET | Freq: Every day | ORAL | Status: DC
  Administered 2016-01-23 – 2016-01-24 (×2): 1 via ORAL
  Filled 2016-01-23 (×2): qty 1

## 2016-01-23 MED ORDER — HEPARIN SODIUM (PORCINE) 5000 UNIT/ML IJ SOLN
5000.0000 [IU] | Freq: Three times a day (TID) | INTRAMUSCULAR | Status: DC
  Administered 2016-01-23 (×2): 5000 [IU] via SUBCUTANEOUS
  Filled 2016-01-23: qty 1

## 2016-01-23 MED ORDER — INSULIN ASPART 100 UNIT/ML ~~LOC~~ SOLN: 0.0000 [IU] | Freq: Every day | SUBCUTANEOUS | Status: DC

## 2016-01-23 MED ORDER — SODIUM CHLORIDE 0.9 % IV BOLUS (SEPSIS)
500.0000 mL | Freq: Once | INTRAVENOUS | Status: AC
  Administered 2016-01-23: 500 mL via INTRAVENOUS

## 2016-01-23 MED ORDER — B COMPLEX-B12 PO TABS: 1.0000 | ORAL_TABLET | Freq: Every day | ORAL | Status: DC

## 2016-01-23 MED ORDER — LANTHANUM CARBONATE 500 MG PO CHEW
1000.0000 mg | CHEWABLE_TABLET | Freq: Three times a day (TID) | ORAL | Status: DC
  Administered 2016-01-23 – 2016-01-24 (×3): 1000 mg via ORAL
  Filled 2016-01-23 (×3): qty 2

## 2016-01-23 MED ORDER — SODIUM CHLORIDE 0.9% FLUSH
3.0000 mL | Freq: Two times a day (BID) | INTRAVENOUS | Status: DC
  Administered 2016-01-23 – 2016-01-24 (×2): 3 mL via INTRAVENOUS

## 2016-01-23 MED ORDER — CINACALCET HCL 30 MG PO TABS
30.0000 mg | ORAL_TABLET | Freq: Every day | ORAL | Status: DC
  Administered 2016-01-23: 30 mg via ORAL
  Filled 2016-01-23: qty 1

## 2016-01-23 MED ORDER — LATANOPROST 0.005 % OP SOLN
1.0000 [drp] | Freq: Every day | OPHTHALMIC | Status: DC
  Filled 2016-01-23: qty 2.5

## 2016-01-23 MED ORDER — ATORVASTATIN CALCIUM 20 MG PO TABS
20.0000 mg | ORAL_TABLET | Freq: Every day | ORAL | Status: DC
  Administered 2016-01-23: 20 mg via ORAL
  Filled 2016-01-23: qty 1

## 2016-01-23 MED ORDER — COLESEVELAM HCL 625 MG PO TABS
1250.0000 mg | ORAL_TABLET | Freq: Every day | ORAL | Status: DC
  Administered 2016-01-23: 1250 mg via ORAL
  Filled 2016-01-23: qty 2

## 2016-01-23 NOTE — ED Notes (Signed)
Doctor at bedside.

## 2016-01-23 NOTE — H&P (Signed)
History and Physical    Shawn [name] [id number] DOB: Jul 12, 1960 DOA: 01/23/2016  PCP: Shawn Brazil, MD Patient coming from: dialysis/home  Chief Complaint: syncope  HPI: Shawn [name] is a very pleasant 55 y.o. male with medical history significant for hypertension, diabetes, end-stage renal disease on dialysis, stroke presents to the emergency department from dialysis Center with the chief complaint of syncope. Initial evaluation reveals orthostatic hypotension.  Information is obtained from the patient and the chart. He reports completing his dialysis session today upon walking to the scales it was noted by staff that his level of consciousness was changing. He was reportedly "Eased onto the floor" dialysis facilities denies that he hit his head. Patient denies dizziness lightheadedness chest pain palpitation shortness of breath. He denies any abdominal pain nausea vomiting diarrhea. He denies any recent illness fever chills or sick contacts. EMS was called he received 650 mL of normal saline    ED Course: In the emergency department he is provided with 250 mL of normal saline.  Review of Systems: As per HPI otherwise 10 point review of systems negative.   Ambulatory Status: He ambulates with a cane due to blindness no recent falls  Past Medical History  Diagnosis Date  . Hypertension   . No pertinent past medical history   . Blind right eye   . Legally blind   . Anemia   . Complication of anesthesia   . PONV (postoperative nausea and vomiting)     N/V- became dehydrated had to have IV fluids- 01/2015  . Stroke [facility]) 2012    date per patient  . 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 Shortness of breath dyspnea     when has too fluid  . Diabetes mellitus     type II  . Chronic kidney disease     Dialysis since 2011 MWF  . Arthritis     patient denies  . Syncope     Past Surgical History  Procedure  Laterality Date  . Left arm graft  10/2010  . Insertion of dialysis catheter Right   . Eye surgery Bilateral     retina surgery both eyes, cataract surgery both eyes  . Colonoscopy  06/2012  . Av fistula placement Right 05/12/2013    Procedure: INSERTION OF ARTERIOVENOUS (AV) GORE-TEX GRAFT ARM; ULTRASOUND GUIDED;  Surgeon: Conrad [place], MD;  Location: [facility] OR;  Service: Vascular;  Laterality: Right;  . Hernia repair      right inguinal  . Av fistula placement Left 01/18/2015    Procedure: INSERTION OF ARTERIOVENOUS GORE-TEX GRAFT LEFT UPPER ARM;  Surgeon: Conrad [place], MD;  Location: [place];  Service: Vascular;  Laterality: Left;  . Ligation arteriovenous gortex graft Left 05/03/2015    Procedure: LIGATION ARTERIOVENOUS GORTEX GRAFT-LEFT UPPER ARM;  Surgeon: Conrad Kerr, MD;  Location: [place];  Service: Vascular;  Laterality: Left;    Social History   Social History  . Marital Status: Married    Spouse Name: N/A  . Number of Children: 0  . Years of Education: 12th   Occupational History  .     Social History Main Topics  . Smoking status: Never Smoker   . Smokeless tobacco: Never Used  . Alcohol Use: No  . Drug Use: No  . Sexual Activity: Not on file   Other Topics Concern  . Not on file   Social History Narrative   Patient lives at home with wife.  Patient is disabled.    Patient has no children.    Patient has a 12 grade education.           No Known Allergies  Family History  Problem Relation Age of Onset  . Cancer Sister   . Stroke Sister   . Diabetes Mother   . Alzheimer's disease Mother   . Cancer Father   . Heart disease Father     Prior to Admission medications   Medication Sig Start Date End Date Taking? Authorizing Provider  acetaminophen (TYLENOL) 325 MG tablet Take 650 mg by mouth every 6 (six) hours as needed (pain).   Yes Historical Provider, MD  aspirin 325 MG tablet Take 325 mg by mouth daily.   Yes Historical Provider, MD  atorvastatin  (LIPITOR) 20 MG tablet Take 20 mg by mouth daily.   Yes Historical Provider, MD  B Complex Vitamins (B COMPLEX-B12) TABS Take 1 tablet by mouth daily.   Yes Historical Provider, MD  cinacalcet (SENSIPAR) 30 MG tablet Take 30 mg by mouth daily with supper.   Yes Historical Provider, MD  colesevelam (WELCHOL) 625 MG tablet Take 1,250 mg by mouth daily with supper.   Yes Historical Provider, MD  dorzolamide-timolol (COSOPT) 22.3-6.8 MG/ML ophthalmic solution Place 1 drop into the left eye 2 (two) times daily.   Yes Historical Provider, MD  insulin NPH-insulin regular (NOVOLIN 70/30) (70-30) 100 UNIT/ML injection Inject 15 Units into the skin 2 (two) times daily with a meal.   Yes Historical Provider, MD  lanthanum (FOSRENOL) 1000 MG chewable tablet Chew 1,000 mg by mouth 3 (three) times daily with meals.   Yes Historical Provider, MD  latanoprost (XALATAN) 0.005 % ophthalmic solution Place 1 drop into the left eye at bedtime.   Yes Historical Provider, MD    Physical Exam: Filed Vitals:   01/23/16 1400 01/23/16 1430 01/23/16 1500 01/23/16 1617  BP: 109/57 103/73 111/62   Pulse: 86 88 84   Temp:      TempSrc:      Resp: 15 17 19    Height:    5\' 11"  (1.803 m)  Weight:    104.4 kg (230 lb 2.6 oz)  SpO2: 99% 100% 99%      General:  Appears calm and comfortable, no acute distress Eyes:  PERRL, EOMI, normal lids, iris ENT:  grossly normal hearing, lips & tongue, mucous membranes of his mouth are moist and pink Neck:  no LAD, masses or thyromegaly Cardiovascular:  RRR, no m/r/g. No LE edema.  Respiratory:  CTA bilaterally, no w/r/r. Normal respiratory effort. Abdomen:  soft, ntnd, positive bowel sounds throughout no guarding or rebounding Skin:  no rash or induration seen on limited exam Musculoskeletal:  grossly normal tone BUE/BLE, good ROM, no bony abnormality Psychiatric:  grossly normal mood and affect, speech fluent and appropriate, AOx3 Neurologic:  CN 2-12 grossly intact, moves all  extremities in coordinated fashion, sensation intact  Labs on Admission: I have personally reviewed following labs and imaging studies  CBC:  Recent Labs Lab 01/23/16 1151  WBC 7.3  HGB 14.1  HCT 44.5  MCV 92.7  PLT [id number]   Basic Metabolic Panel:  Recent Labs Lab 01/23/16 1151  NA 133*  K 4.5  CL 95*  CO2 26  GLUCOSE 190*  BUN 16  CREATININE 6.80*  CALCIUM 8.6*   GFR: Estimated Creatinine Clearance: 15.3 mL/min (by C-G formula based on Cr of 6.8). Liver Function Tests: No results for input(s): AST, ALT,  ALKPHOS, BILITOT, PROT, ALBUMIN in the last 168 hours. No results for input(s): LIPASE, AMYLASE in the last 168 hours. No results for input(s): AMMONIA in the last 168 hours. Coagulation Profile: No results for input(s): INR, PROTIME in the last 168 hours. Cardiac Enzymes: No results for input(s): CKTOTAL, CKMB, CKMBINDEX, TROPONINI in the last 168 hours. BNP (last 3 results) No results for input(s): PROBNP in the last 8760 hours. HbA1C: No results for input(s): HGBA1C in the last 72 hours. CBG: No results for input(s): GLUCAP in the last 168 hours. Lipid Profile: No results for input(s): CHOL, HDL, LDLCALC, TRIG, CHOLHDL, LDLDIRECT in the last 72 hours. Thyroid Function Tests: No results for input(s): TSH, T4TOTAL, FREET4, T3FREE, THYROIDAB in the last 72 hours. Anemia Panel: No results for input(s): VITAMINB12, FOLATE, FERRITIN, TIBC, IRON, RETICCTPCT in the last 72 hours. Urine analysis:    Component Value Date/Time   COLORURINE YELLOW 01/25/2010 1953   APPEARANCEUR CLOUDY* 01/25/2010 1953   LABSPEC 1.017 01/25/2010 1953   PHURINE 5.5 01/25/2010 1953   GLUCOSEU 100* 01/25/2010 1953   HGBUR SMALL* 01/25/2010 [place] NEGATIVE 01/25/2010 [place] NEGATIVE 01/25/2010 1953   PROTEINUR >300* 01/25/2010 1953   UROBILINOGEN 0.2 01/25/2010 1953   NITRITE NEGATIVE 01/25/2010 1953   LEUKOCYTESUR NEGATIVE 01/25/2010 1953    Creatinine  Clearance: Estimated Creatinine Clearance: 15.3 mL/min (by C-G formula based on Cr of 6.8).  Sepsis Labs: @LABRCNTIP (procalcitonin:4,lacticidven:4) )No results found for this or any previous visit (from the past 240 hour(s)).   Radiological Exams on Admission: Dg Chest 2 View  01/23/2016  CLINICAL DATA:  Dialysis, dizziness, weakness EXAM: CHEST  2 VIEW COMPARISON:  05/12/2013 FINDINGS: Lungs are clear.  No pleural effusion or pneumothorax. The heart is normal in size. Right dual lumen dialysis catheter terminates in the upper right atrium. Visualized osseous structures are within normal limits. IMPRESSION: No evidence of acute cardiopulmonary disease. Electronically Signed   By: Julian Hy M.D.   On: 01/23/2016 13:10    EKG: Independently reviewed. Sinus rhythm Borderline prolonged QT interval No acute changes No significant change since last tracing  Assessment/Plan Principal Problem:   Syncope Active Problems:   End stage renal disease (HCC)   Legally blind   Hypertension   Orthostatic hypotension   Diabetes ([place])   #1. Syncope. Likely related to blood pressure after dialysis. Patient states baseline systolic blood pressure in the 90s. Chest x-ray with no evidence of acute cardiopulmonary disease. No signs of infectious process. No metabolic derangement. Systolic blood pressure drops from 93-81 from lying to standing in the emergency department. He was provided with 650 mL normal saline in the field and 250 mL of normal saline in the emergency department -Admit to telemetry -Recheck orthostatics this evening -Provide by mouth fluids and nourishment -orthostatic vs in am  #2. End-stage renal disease on Monday Wednesday Friday dialysis. He's been on dialysis for 6 years. He completed his session today. He makes no urine. creatinine at baseline on admission -hopeful discharge tomorrow he can resume Monday Wednesday Friday dialysis session outpatient per his usual  #3. Diabetes  type 2. Serum glucose 190 on admission. Home medications include 7030. -We'll obtain a hemoglobin A1c -We'll use sliding scale insulin for optimal control -Diabetes consult patient states he needs equipment to monitor blood sugar at home  #4. Hypertension. Patient with a history of hypertension. He states primary care provider stopped any anti- hypertensive medications. Systolic blood pressure [id number] on admission -Monitor -See #1  #  5. Legally blind. Stable -Continue home eyedrops  #6. Hyponatremia. Mild. Sodium 133. Chart review indicates this is close to baseline -Recheck in the morning    DVT prophylaxis: heparin  Code Status: full  Family Communication: none present  Disposition Plan: home hopefully tomorrow  Consults called: none  Admission status: obs    Radene Gunning MD [facility]  If 7PM-7AM, please contact night-coverage [email or web address] Password [facility]  01/23/2016, 4:23 PM

## 2016-01-23 NOTE — Progress Notes (Signed)
Called ED for report, spoke with Carleene Overlie.

## 2016-01-23 NOTE — Progress Notes (Signed)
Patient arrived on unit from ED via stretcher.  No family at bedside.  Telemetry placed per MD order.

## 2016-01-23 NOTE — ED Notes (Signed)
Admit provider at bedside 

## 2016-01-23 NOTE — ED Provider Notes (Signed)
CSN: [id number]     Arrival date & time 01/23/16  1133 History   First MD Initiated Contact with Patient 01/23/16 1203     Chief Complaint  Patient presents with  . Loss of Consciousness   HPI   Shawn [name] is a 55 y.o. male PMH significant for HTN, stroke, DM, CKD (HD MWF) presenting status post syncopal episode today after receiving dialysis. He states that his treatment was about to be finished and he felt flushed and slightly dizzy. When he went to go weigh in on the scales, he felt dizzy, slid down into a chair, did not hit his head but passed out. He thinks it was for about a minute. He denies chest pain, shortness of breath, abdominal pain, nausea, vomiting.  Past Medical History  Diagnosis Date  . Hypertension   . No pertinent past medical history   . Blind right eye   . Legally blind   . Anemia   . Complication of anesthesia   . PONV (postoperative nausea and vomiting)     N/V- became dehydrated had to have IV fluids- 01/2015  . Stroke [facility]) 2012    date per patient  . 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 Shortness of breath dyspnea     when has too fluid  . Diabetes mellitus     type II  . Chronic kidney disease     Dialysis since 2011 MWF  . Arthritis     patient denies   Past Surgical History  Procedure Laterality Date  . Left arm graft  10/2010  . Insertion of dialysis catheter Right   . Eye surgery Bilateral     retina surgery both eyes, cataract surgery both eyes  . Colonoscopy  06/2012  . Av fistula placement Right 05/12/2013    Procedure: INSERTION OF ARTERIOVENOUS (AV) GORE-TEX GRAFT ARM; ULTRASOUND GUIDED;  Surgeon: Conrad [place], MD;  Location: [place];  Service: Vascular;  Laterality: Right;  . Hernia repair      right inguinal  . Av fistula placement Left 01/18/2015    Procedure: INSERTION OF ARTERIOVENOUS GORE-TEX GRAFT LEFT UPPER ARM;  Surgeon: Conrad [place], MD;  Location: [place];  Service: Vascular;   Laterality: Left;  . Ligation arteriovenous gortex graft Left 05/03/2015    Procedure: LIGATION ARTERIOVENOUS GORTEX GRAFT-LEFT UPPER ARM;  Surgeon: Conrad [place], MD;  Location: [place];  Service: Vascular;  Laterality: Left;   Family History  Problem Relation Age of Onset  . Cancer Sister   . Stroke Sister   . Diabetes Mother   . Alzheimer's disease Mother   . Cancer Father   . Heart disease Father    Social History  Substance Use Topics  . Smoking status: Never Smoker   . Smokeless tobacco: Never Used  . Alcohol Use: No    Review of Systems  Ten systems are reviewed and are negative for acute change except as noted in the HPI  Allergies  Review of patient's allergies indicates no known allergies.  Home Medications   Prior to Admission medications   Medication Sig Start Date End Date Taking? Authorizing Provider  acetaminophen (TYLENOL) 325 MG tablet Take 650 mg by mouth every 6 (six) hours as needed (pain).   Yes Historical Provider, MD  aspirin 325 MG tablet Take 325 mg by mouth daily.   Yes Historical Provider, MD  atorvastatin (LIPITOR) 20 MG tablet Take 20 mg by mouth daily.   Yes Historical  Provider, MD  B Complex Vitamins (B COMPLEX-B12) TABS Take 1 tablet by mouth daily.   Yes Historical Provider, MD  cinacalcet (SENSIPAR) 30 MG tablet Take 30 mg by mouth daily with supper.   Yes Historical Provider, MD  colesevelam (WELCHOL) 625 MG tablet Take 1,250 mg by mouth daily with supper.   Yes Historical Provider, MD  dorzolamide-timolol (COSOPT) 22.3-6.8 MG/ML ophthalmic solution Place 1 drop into the left eye 2 (two) times daily.   Yes Historical Provider, MD  insulin NPH-insulin regular (NOVOLIN 70/30) (70-30) 100 UNIT/ML injection Inject 15 Units into the skin 2 (two) times daily with a meal.   Yes Historical Provider, MD  lanthanum (FOSRENOL) 1000 MG chewable tablet Chew 1,000 mg by mouth 3 (three) times daily with meals.   Yes Historical Provider, MD  latanoprost  (XALATAN) 0.005 % ophthalmic solution Place 1 drop into the left eye at bedtime.   Yes Historical Provider, MD   BP 101/64 mmHg  Pulse 87  Temp(Src) 97.5 F (36.4 C) (Oral)  Resp 18  Ht 5\' 11"  (1.803 m)  Wt 104 kg  BMI 31.99 kg/m2  SpO2 99% Physical Exam  Constitutional: He appears well-developed and well-nourished. No distress.  HENT:  Head: Normocephalic and atraumatic.  Eyes: Conjunctivae are normal. Right eye exhibits no discharge. Left eye exhibits no discharge. No scleral icterus.  Neck: No tracheal deviation present.  Cardiovascular: Normal rate, regular rhythm, normal heart sounds and intact distal pulses.  Exam reveals no gallop and no friction rub.   No murmur heard. Pulmonary/Chest: Effort normal and breath sounds normal. No respiratory distress. He has no wheezes. He has no rales. He exhibits no tenderness.  Abdominal: Soft. Bowel sounds are normal. He exhibits no distension and no mass. There is no tenderness. There is no rebound and no guarding.  Musculoskeletal: He exhibits no edema.  Lymphadenopathy:    He has no cervical adenopathy.  Neurological: He is alert. Coordination normal.  Skin: Skin is warm and dry. No rash noted. He is not diaphoretic. No erythema.  Psychiatric: He has a normal mood and affect. His behavior is normal.  Nursing note and vitals reviewed.   ED Course  Procedures  Labs Review Labs Reviewed  BASIC METABOLIC PANEL - Abnormal; Notable for the following:    Sodium 133 (*)    Chloride 95 (*)    Glucose, Bld 190 (*)    Creatinine, Ser 6.80 (*)    Calcium 8.6 (*)    GFR calc non Af Amer 8 (*)    GFR calc Af Amer 10 (*)    All other components within normal limits  CBC  I-STAT TROPOININ, ED   Imaging Review Dg Chest 2 View  01/23/2016  CLINICAL DATA:  Dialysis, dizziness, weakness EXAM: CHEST  2 VIEW COMPARISON:  05/12/2013 FINDINGS: Lungs are clear.  No pleural effusion or pneumothorax. The heart is normal in size. Right dual lumen  dialysis catheter terminates in the upper right atrium. Visualized osseous structures are within normal limits. IMPRESSION: No evidence of acute cardiopulmonary disease. Electronically Signed   By: Julian Hy M.D.   On: 01/23/2016 13:10   I have personally reviewed and evaluated these images and lab results as part of my medical decision-making.   EKG Interpretation   Date/Time:  Monday January 23 2016 11:37:31 EDT Ventricular Rate:  80 PR Interval:    QRS Duration: 93 QT Interval:  413 QTC Calculation: 477 R Axis:   28 Text Interpretation:  Sinus rhythm  Borderline prolonged QT interval No  acute changes No significant change since last tracing Confirmed by  Kathrynn Humble, MD, ANKIT [phone]) on 01/23/2016 11:58:12 AM      MDM   Final diagnoses:  Syncope, unspecified syncope type   Patient nontoxic appearing. Patient feeling improved after fluids. IV bolus here was stopped at 250 cc. Orthostatics were negative. Troponin, BMP, CBC, chest x-ray, EKG unremarkable for acute changes. Most likely orthostatic hypotension given prodrome of symptoms; however, patient will need to be admitted for observation. Hospitalist agrees to admission to tele obs. Patient in understanding and agreement with the plan.   [place], PA-C 01/23/16 [place], MD 01/26/16 [phone]

## 2016-01-23 NOTE — ED Notes (Signed)
Pt. Coming from dialysis via [facility] for syncope. Pt. There for full treatment. Pt. Went to stand up and had a syncopal episode. Pt. Eased to the floor. Dialysis facility denies pt. Hit his head. Pt. Recently had his dry weight increased to 104. Pt. sts that he hasn't had and issue with this for a while now. Pt. Given 650cc normal saline by staff through dialysis access before EMS arrived. Pt. C/o being tired and weak.

## 2016-01-24 DIAGNOSIS — R55 Syncope and collapse: Principal | ICD-10-CM

## 2016-01-24 LAB — HEMOGLOBIN A1C
HEMOGLOBIN A1C: 8.3 % — AB (ref 4.8–5.6)
MEAN PLASMA GLUCOSE: 192 mg/dL

## 2016-01-24 LAB — GLUCOSE, CAPILLARY
GLUCOSE-CAPILLARY: 138 mg/dL — AB (ref 65–99)
GLUCOSE-CAPILLARY: 170 mg/dL — AB (ref 65–99)

## 2016-01-24 NOTE — Progress Notes (Signed)
Shawn [name] to be D/C'd Home per MD order.  Discussed prescriptions and follow up appointments with the patient. Prescriptions given to patient, medication list explained in detail. Pt verbalized understanding.    Medication List    TAKE these medications        acetaminophen 325 MG tablet  Commonly known as:  TYLENOL  Take 650 mg by mouth every 6 (six) hours as needed (pain).     aspirin 325 MG tablet  Take 325 mg by mouth daily.     atorvastatin 20 MG tablet  Commonly known as:  LIPITOR  Take 20 mg by mouth daily.     B Complex-B12 Tabs  Take 1 tablet by mouth daily.     cinacalcet 30 MG tablet  Commonly known as:  SENSIPAR  Take 30 mg by mouth daily with supper.     dorzolamide-timolol 22.3-6.8 MG/ML ophthalmic solution  Commonly known as:  COSOPT  Place 1 drop into the left eye 2 (two) times daily.     insulin NPH-regular Human (70-30) 100 UNIT/ML injection  Commonly known as:  NOVOLIN 70/30  Inject 15 Units into the skin 2 (two) times daily with a meal.     lanthanum 1000 MG chewable tablet  Commonly known as:  FOSRENOL  Chew 1,000 mg by mouth 3 (three) times daily with meals.     latanoprost 0.005 % ophthalmic solution  Commonly known as:  XALATAN  Place 1 drop into the left eye at bedtime.     WELCHOL 625 MG tablet  Generic drug:  colesevelam  Take 1,250 mg by mouth daily with supper.        Filed Vitals:   01/24/16 0624 01/24/16 0900  BP: 115/59 110/66  Pulse: 74 78  Temp: 97.9 F (36.6 C) 97.9 F (36.6 C)  Resp: 16 16    Skin clean, dry and intact without evidence of skin break down, no evidence of skin tears noted. IV catheter discontinued intact. Site without signs and symptoms of complications. Dressing and pressure applied. Pt denies pain at this time. No complaints noted.  An After Visit Summary was printed and given to the patient. Patient escorted via [place], and D/C home via private auto.  Retta Mac BSN, RN

## 2016-01-24 NOTE — Discharge Summary (Signed)
Physician Discharge Summary  Shawn [name] [id number] DOB: 1960-09-13 DOA: 01/23/2016  PCP: Leola Brazil, MD  Admit date: 01/23/2016 Discharge date: 01/24/2016  Admitted From: home Disposition:  home  Recommendations for Outpatient Follow-up:  1. Follow up with HD as scheduled  Home Health: none Equipment/Devices: none  Discharge Condition: stable CODE STATUS: Full Diet recommendation: renal  HPI: Shawn [name] is a 55 y.o. male with a Past Medical History of HTN, legal blindness, CVA, diabetes, CTD who presents with syncopal episode. Spectazole voiding depletion orthostatic related. No evidence of seizure, significant metabolic derangement, infection, stroke that could cause symptoms  Hospital Course: Discharge Diagnoses:  Principal Problem:   Syncope Active Problems:   End stage renal disease (HCC)   Legally blind   Hypertension   Orthostatic hypotension   Diabetes ([place])   Diabetes mellitus with complication (HCC)  Syncope - likely related to hypotension after dialysis. Patient was given ~ 1L of NS with improvement in his BP and resolution of his hypotension. He is at baseline and will be discharged home in stable condition. This has happened to him in the past and his dry weight needed to be increased. Optionally could use Midodrine but will defer this to outpatient nephrologist.  End-stage renal disease - on Monday Wednesday Friday dialysis.  Diabetes type 2 Hypertension - Patient with a history of hypertension. He states primary care provider stopped any anti- hypertensive medications.  Legally blind Hyponatremia - Mild. Sodium 133. This is close to baseline    Discharge Instructions     Medication List    TAKE these medications        acetaminophen 325 MG tablet  Commonly known as:  TYLENOL  Take 650 mg by mouth every 6 (six) hours as needed (pain).     aspirin 325 MG tablet  Take 325 mg by mouth daily.     atorvastatin 20  MG tablet  Commonly known as:  LIPITOR  Take 20 mg by mouth daily.     B Complex-B12 Tabs  Take 1 tablet by mouth daily.     cinacalcet 30 MG tablet  Commonly known as:  SENSIPAR  Take 30 mg by mouth daily with supper.     dorzolamide-timolol 22.3-6.8 MG/ML ophthalmic solution  Commonly known as:  COSOPT  Place 1 drop into the left eye 2 (two) times daily.     insulin NPH-regular Human (70-30) 100 UNIT/ML injection  Commonly known as:  NOVOLIN 70/30  Inject 15 Units into the skin 2 (two) times daily with a meal.     lanthanum 1000 MG chewable tablet  Commonly known as:  FOSRENOL  Chew 1,000 mg by mouth 3 (three) times daily with meals.     latanoprost 0.005 % ophthalmic solution  Commonly known as:  XALATAN  Place 1 drop into the left eye at bedtime.     WELCHOL 625 MG tablet  Generic drug:  colesevelam  Take 1,250 mg by mouth daily with supper.           Follow-up Information    Follow up with [facility] JR,GEORGE R, MD In 1 week.   Specialty:  Pulmonary Disease   Contact information:   [place] [address] [phone]      No Known Allergies  Consultations:  None   Procedures/Studies:  Dg Chest 2 View  01/23/2016  CLINICAL DATA:  Dialysis, dizziness, weakness EXAM: CHEST  2 VIEW COMPARISON:  05/12/2013 FINDINGS: Lungs are clear.  No pleural  effusion or pneumothorax. The heart is normal in size. Right dual lumen dialysis catheter terminates in the upper right atrium. Visualized osseous structures are within normal limits. IMPRESSION: No evidence of acute cardiopulmonary disease. Electronically Signed   By: Julian Hy M.D.   On: 01/23/2016 13:10      Subjective: - no chest pain, shortness of breath, no abdominal pain, nausea or vomiting.   Discharge Exam: Filed Vitals:   01/24/16 0624 01/24/16 0900  BP: 115/59 110/66  Pulse: 74 78  Temp: 97.9 F (36.6 C) 97.9 F (36.6 C)  Resp: 16 16   Filed Vitals:   01/23/16 2009  01/23/16 2052 01/24/16 0624 01/24/16 0900  BP: 76/49 85/54 115/59 110/66  Pulse: 83  74 78  Temp: 98.8 F (37.1 C)  97.9 F (36.6 C) 97.9 F (36.6 C)  TempSrc: Oral  Oral Oral  Resp: 18  16 16   Height:      Weight: 105 kg (231 lb 7.7 oz)     SpO2: 98%  100% 100%    General: Pt is alert, awake, not in acute distress Cardiovascular: RRR, S1/S2 +, no rubs, no gallops Respiratory: CTA bilaterally, no wheezing, no rhonchi    The results of significant diagnostics from this hospitalization (including imaging, microbiology, ancillary and laboratory) are listed below for reference.     Microbiology: Recent Results (from the past 240 hour(s))  MRSA PCR Screening     Status: None   Collection Time: 01/23/16  4:35 PM  Result Value Ref Range Status   MRSA by PCR NEGATIVE NEGATIVE Final    Comment:        The GeneXpert MRSA Assay (FDA approved for NASAL specimens only), is one component of a comprehensive MRSA colonization surveillance program. It is not intended to diagnose MRSA infection nor to guide or monitor treatment for MRSA infections.      Labs: BNP (last 3 results) No results for input(s): BNP in the last 8760 hours. Basic Metabolic Panel:  Recent Labs Lab 01/23/16 1151  NA 133*  K 4.5  CL 95*  CO2 26  GLUCOSE 190*  BUN 16  CREATININE 6.80*  CALCIUM 8.6*   Liver Function Tests: No results for input(s): AST, ALT, ALKPHOS, BILITOT, PROT, ALBUMIN in the last 168 hours. No results for input(s): LIPASE, AMYLASE in the last 168 hours. No results for input(s): AMMONIA in the last 168 hours. CBC:  Recent Labs Lab 01/23/16 1151  WBC 7.3  HGB 14.1  HCT 44.5  MCV 92.7  PLT 240   Cardiac Enzymes: No results for input(s): CKTOTAL, CKMB, CKMBINDEX, TROPONINI in the last 168 hours. BNP: Invalid input(s): POCBNP CBG:  Recent Labs Lab 01/23/16 1632 01/23/16 2008 01/24/16 0730 01/24/16 1213  GLUCAP 234* 199* 138* 170*   D-Dimer No results for  input(s): DDIMER in the last 72 hours. Hgb A1c  Recent Labs  01/23/16 1151  HGBA1C 8.3*   Lipid Profile No results for input(s): CHOL, HDL, LDLCALC, TRIG, CHOLHDL, LDLDIRECT in the last 72 hours. Thyroid function studies No results for input(s): TSH, T4TOTAL, T3FREE, THYROIDAB in the last 72 hours.  Invalid input(s): FREET3 Anemia work up No results for input(s): VITAMINB12, FOLATE, FERRITIN, TIBC, IRON, RETICCTPCT in the last 72 hours. Urinalysis    Component Value Date/Time   COLORURINE YELLOW 01/25/2010 1953   APPEARANCEUR CLOUDY* 01/25/2010 1953   LABSPEC 1.017 01/25/2010 1953   PHURINE 5.5 01/25/2010 1953   GLUCOSEU 100* 01/25/2010 1953   HGBUR SMALL* 01/25/2010 1953  BILIRUBINUR NEGATIVE 01/25/2010 1953   KETONESUR NEGATIVE 01/25/2010 1953   PROTEINUR >300* 01/25/2010 1953   UROBILINOGEN 0.2 01/25/2010 1953   NITRITE NEGATIVE 01/25/2010 1953   LEUKOCYTESUR NEGATIVE 01/25/2010 1953   Sepsis Labs Invalid input(s): PROCALCITONIN,  WBC,  LACTICIDVEN Microbiology Recent Results (from the past 240 hour(s))  MRSA PCR Screening     Status: None   Collection Time: 01/23/16  4:35 PM  Result Value Ref Range Status   MRSA by PCR NEGATIVE NEGATIVE Final    Comment:        The GeneXpert MRSA Assay (FDA approved for NASAL specimens only), is one component of a comprehensive MRSA colonization surveillance program. It is not intended to diagnose MRSA infection nor to guide or monitor treatment for MRSA infections.      Time coordinating discharge: Less than 30 minutes  SIGNED:  Marzetta Board, MD  [facility] 01/24/2016, 2:09 PM Pager [phone]  If 7PM-7AM, please contact night-coverage [email or web address] Password [email or web address]

## 2016-01-24 NOTE — Care Management Obs Status (Signed)
[facility] NOTIFICATION   Patient Details  Name: Shawn [name] MRN: [id number] Date of Birth: 01-15-1961   Medicare Observation Status Notification Given:  Yes  Pt blind and unable to sign states understanding of OBS status and is ready for d/c.   Minah Axelrod, Rory Percy, RN 01/24/2016, 1:05 PM

## 2016-01-25 DIAGNOSIS — N186 End stage renal disease: Secondary | ICD-10-CM | POA: Diagnosis not present

## 2016-01-25 DIAGNOSIS — E119 Type 2 diabetes mellitus without complications: Secondary | ICD-10-CM | POA: Diagnosis not present

## 2016-01-25 DIAGNOSIS — D509 Iron deficiency anemia, unspecified: Secondary | ICD-10-CM | POA: Diagnosis not present

## 2016-01-25 DIAGNOSIS — D631 Anemia in chronic kidney disease: Secondary | ICD-10-CM | POA: Diagnosis not present

## 2016-01-25 DIAGNOSIS — N2581 Secondary hyperparathyroidism of renal origin: Secondary | ICD-10-CM | POA: Diagnosis not present

## 2016-01-27 DIAGNOSIS — N2581 Secondary hyperparathyroidism of renal origin: Secondary | ICD-10-CM | POA: Diagnosis not present

## 2016-01-27 DIAGNOSIS — N186 End stage renal disease: Secondary | ICD-10-CM | POA: Diagnosis not present

## 2016-01-27 DIAGNOSIS — D631 Anemia in chronic kidney disease: Secondary | ICD-10-CM | POA: Diagnosis not present

## 2016-01-27 DIAGNOSIS — D509 Iron deficiency anemia, unspecified: Secondary | ICD-10-CM | POA: Diagnosis not present

## 2016-01-27 DIAGNOSIS — E119 Type 2 diabetes mellitus without complications: Secondary | ICD-10-CM | POA: Diagnosis not present

## 2016-01-30 DIAGNOSIS — N2581 Secondary hyperparathyroidism of renal origin: Secondary | ICD-10-CM | POA: Diagnosis not present

## 2016-01-30 DIAGNOSIS — N186 End stage renal disease: Secondary | ICD-10-CM | POA: Diagnosis not present

## 2016-01-30 DIAGNOSIS — E119 Type 2 diabetes mellitus without complications: Secondary | ICD-10-CM | POA: Diagnosis not present

## 2016-01-30 DIAGNOSIS — D631 Anemia in chronic kidney disease: Secondary | ICD-10-CM | POA: Diagnosis not present

## 2016-01-30 DIAGNOSIS — D509 Iron deficiency anemia, unspecified: Secondary | ICD-10-CM | POA: Diagnosis not present

## 2016-02-01 DIAGNOSIS — E1129 Type 2 diabetes mellitus with other diabetic kidney complication: Secondary | ICD-10-CM | POA: Diagnosis not present

## 2016-02-01 DIAGNOSIS — D631 Anemia in chronic kidney disease: Secondary | ICD-10-CM | POA: Diagnosis not present

## 2016-02-01 DIAGNOSIS — D509 Iron deficiency anemia, unspecified: Secondary | ICD-10-CM | POA: Diagnosis not present

## 2016-02-01 DIAGNOSIS — N186 End stage renal disease: Secondary | ICD-10-CM | POA: Diagnosis not present

## 2016-02-01 DIAGNOSIS — E119 Type 2 diabetes mellitus without complications: Secondary | ICD-10-CM | POA: Diagnosis not present

## 2016-02-01 DIAGNOSIS — N2581 Secondary hyperparathyroidism of renal origin: Secondary | ICD-10-CM | POA: Diagnosis not present

## 2016-02-03 DIAGNOSIS — E119 Type 2 diabetes mellitus without complications: Secondary | ICD-10-CM | POA: Diagnosis not present

## 2016-02-03 DIAGNOSIS — D631 Anemia in chronic kidney disease: Secondary | ICD-10-CM | POA: Diagnosis not present

## 2016-02-03 DIAGNOSIS — N2581 Secondary hyperparathyroidism of renal origin: Secondary | ICD-10-CM | POA: Diagnosis not present

## 2016-02-03 DIAGNOSIS — N186 End stage renal disease: Secondary | ICD-10-CM | POA: Diagnosis not present

## 2016-02-03 DIAGNOSIS — D509 Iron deficiency anemia, unspecified: Secondary | ICD-10-CM | POA: Diagnosis not present

## 2016-02-06 DIAGNOSIS — D631 Anemia in chronic kidney disease: Secondary | ICD-10-CM | POA: Diagnosis not present

## 2016-02-06 DIAGNOSIS — N2581 Secondary hyperparathyroidism of renal origin: Secondary | ICD-10-CM | POA: Diagnosis not present

## 2016-02-06 DIAGNOSIS — N186 End stage renal disease: Secondary | ICD-10-CM | POA: Diagnosis not present

## 2016-02-06 DIAGNOSIS — D509 Iron deficiency anemia, unspecified: Secondary | ICD-10-CM | POA: Diagnosis not present

## 2016-02-06 DIAGNOSIS — E119 Type 2 diabetes mellitus without complications: Secondary | ICD-10-CM | POA: Diagnosis not present

## 2016-02-06 DIAGNOSIS — I120 Hypertensive chronic kidney disease with stage 5 chronic kidney disease or end stage renal disease: Secondary | ICD-10-CM | POA: Diagnosis not present

## 2016-02-06 DIAGNOSIS — Z992 Dependence on renal dialysis: Secondary | ICD-10-CM | POA: Diagnosis not present

## 2016-02-08 DIAGNOSIS — D631 Anemia in chronic kidney disease: Secondary | ICD-10-CM | POA: Diagnosis not present

## 2016-02-08 DIAGNOSIS — E119 Type 2 diabetes mellitus without complications: Secondary | ICD-10-CM | POA: Diagnosis not present

## 2016-02-08 DIAGNOSIS — D509 Iron deficiency anemia, unspecified: Secondary | ICD-10-CM | POA: Diagnosis not present

## 2016-02-08 DIAGNOSIS — N2581 Secondary hyperparathyroidism of renal origin: Secondary | ICD-10-CM | POA: Diagnosis not present

## 2016-02-08 DIAGNOSIS — N186 End stage renal disease: Secondary | ICD-10-CM | POA: Diagnosis not present

## 2016-02-09 ENCOUNTER — Ambulatory Visit: Payer: Medicare Other | Admitting: Neurology

## 2016-02-10 DIAGNOSIS — E119 Type 2 diabetes mellitus without complications: Secondary | ICD-10-CM | POA: Diagnosis not present

## 2016-02-10 DIAGNOSIS — N2581 Secondary hyperparathyroidism of renal origin: Secondary | ICD-10-CM | POA: Diagnosis not present

## 2016-02-10 DIAGNOSIS — N186 End stage renal disease: Secondary | ICD-10-CM | POA: Diagnosis not present

## 2016-02-10 DIAGNOSIS — D631 Anemia in chronic kidney disease: Secondary | ICD-10-CM | POA: Diagnosis not present

## 2016-02-10 DIAGNOSIS — D509 Iron deficiency anemia, unspecified: Secondary | ICD-10-CM | POA: Diagnosis not present

## 2016-02-13 DIAGNOSIS — D631 Anemia in chronic kidney disease: Secondary | ICD-10-CM | POA: Diagnosis not present

## 2016-02-13 DIAGNOSIS — E119 Type 2 diabetes mellitus without complications: Secondary | ICD-10-CM | POA: Diagnosis not present

## 2016-02-13 DIAGNOSIS — N186 End stage renal disease: Secondary | ICD-10-CM | POA: Diagnosis not present

## 2016-02-13 DIAGNOSIS — D509 Iron deficiency anemia, unspecified: Secondary | ICD-10-CM | POA: Diagnosis not present

## 2016-02-13 DIAGNOSIS — N2581 Secondary hyperparathyroidism of renal origin: Secondary | ICD-10-CM | POA: Diagnosis not present

## 2016-02-15 DIAGNOSIS — E119 Type 2 diabetes mellitus without complications: Secondary | ICD-10-CM | POA: Diagnosis not present

## 2016-02-15 DIAGNOSIS — N2581 Secondary hyperparathyroidism of renal origin: Secondary | ICD-10-CM | POA: Diagnosis not present

## 2016-02-15 DIAGNOSIS — N186 End stage renal disease: Secondary | ICD-10-CM | POA: Diagnosis not present

## 2016-02-15 DIAGNOSIS — D509 Iron deficiency anemia, unspecified: Secondary | ICD-10-CM | POA: Diagnosis not present

## 2016-02-15 DIAGNOSIS — D631 Anemia in chronic kidney disease: Secondary | ICD-10-CM | POA: Diagnosis not present

## 2016-02-16 DIAGNOSIS — D509 Iron deficiency anemia, unspecified: Secondary | ICD-10-CM | POA: Diagnosis not present

## 2016-02-16 DIAGNOSIS — D631 Anemia in chronic kidney disease: Secondary | ICD-10-CM | POA: Diagnosis not present

## 2016-02-16 DIAGNOSIS — E119 Type 2 diabetes mellitus without complications: Secondary | ICD-10-CM | POA: Diagnosis not present

## 2016-02-16 DIAGNOSIS — N2581 Secondary hyperparathyroidism of renal origin: Secondary | ICD-10-CM | POA: Diagnosis not present

## 2016-02-16 DIAGNOSIS — N186 End stage renal disease: Secondary | ICD-10-CM | POA: Diagnosis not present

## 2016-02-20 DIAGNOSIS — D509 Iron deficiency anemia, unspecified: Secondary | ICD-10-CM | POA: Diagnosis not present

## 2016-02-20 DIAGNOSIS — N186 End stage renal disease: Secondary | ICD-10-CM | POA: Diagnosis not present

## 2016-02-20 DIAGNOSIS — N2581 Secondary hyperparathyroidism of renal origin: Secondary | ICD-10-CM | POA: Diagnosis not present

## 2016-02-20 DIAGNOSIS — D631 Anemia in chronic kidney disease: Secondary | ICD-10-CM | POA: Diagnosis not present

## 2016-02-20 DIAGNOSIS — E119 Type 2 diabetes mellitus without complications: Secondary | ICD-10-CM | POA: Diagnosis not present

## 2016-02-22 DIAGNOSIS — D631 Anemia in chronic kidney disease: Secondary | ICD-10-CM | POA: Diagnosis not present

## 2016-02-22 DIAGNOSIS — N186 End stage renal disease: Secondary | ICD-10-CM | POA: Diagnosis not present

## 2016-02-22 DIAGNOSIS — D509 Iron deficiency anemia, unspecified: Secondary | ICD-10-CM | POA: Diagnosis not present

## 2016-02-22 DIAGNOSIS — N2581 Secondary hyperparathyroidism of renal origin: Secondary | ICD-10-CM | POA: Diagnosis not present

## 2016-02-22 DIAGNOSIS — E119 Type 2 diabetes mellitus without complications: Secondary | ICD-10-CM | POA: Diagnosis not present

## 2016-02-24 DIAGNOSIS — N186 End stage renal disease: Secondary | ICD-10-CM | POA: Diagnosis not present

## 2016-02-24 DIAGNOSIS — D509 Iron deficiency anemia, unspecified: Secondary | ICD-10-CM | POA: Diagnosis not present

## 2016-02-24 DIAGNOSIS — D631 Anemia in chronic kidney disease: Secondary | ICD-10-CM | POA: Diagnosis not present

## 2016-02-24 DIAGNOSIS — N2581 Secondary hyperparathyroidism of renal origin: Secondary | ICD-10-CM | POA: Diagnosis not present

## 2016-02-24 DIAGNOSIS — E119 Type 2 diabetes mellitus without complications: Secondary | ICD-10-CM | POA: Diagnosis not present

## 2016-02-27 DIAGNOSIS — N186 End stage renal disease: Secondary | ICD-10-CM | POA: Diagnosis not present

## 2016-02-27 DIAGNOSIS — D509 Iron deficiency anemia, unspecified: Secondary | ICD-10-CM | POA: Diagnosis not present

## 2016-02-27 DIAGNOSIS — N2581 Secondary hyperparathyroidism of renal origin: Secondary | ICD-10-CM | POA: Diagnosis not present

## 2016-02-27 DIAGNOSIS — E119 Type 2 diabetes mellitus without complications: Secondary | ICD-10-CM | POA: Diagnosis not present

## 2016-02-27 DIAGNOSIS — D631 Anemia in chronic kidney disease: Secondary | ICD-10-CM | POA: Diagnosis not present

## 2016-02-29 DIAGNOSIS — N2581 Secondary hyperparathyroidism of renal origin: Secondary | ICD-10-CM | POA: Diagnosis not present

## 2016-02-29 DIAGNOSIS — E119 Type 2 diabetes mellitus without complications: Secondary | ICD-10-CM | POA: Diagnosis not present

## 2016-02-29 DIAGNOSIS — N186 End stage renal disease: Secondary | ICD-10-CM | POA: Diagnosis not present

## 2016-02-29 DIAGNOSIS — D509 Iron deficiency anemia, unspecified: Secondary | ICD-10-CM | POA: Diagnosis not present

## 2016-02-29 DIAGNOSIS — D631 Anemia in chronic kidney disease: Secondary | ICD-10-CM | POA: Diagnosis not present

## 2016-03-02 DIAGNOSIS — N2581 Secondary hyperparathyroidism of renal origin: Secondary | ICD-10-CM | POA: Diagnosis not present

## 2016-03-02 DIAGNOSIS — D631 Anemia in chronic kidney disease: Secondary | ICD-10-CM | POA: Diagnosis not present

## 2016-03-02 DIAGNOSIS — D509 Iron deficiency anemia, unspecified: Secondary | ICD-10-CM | POA: Diagnosis not present

## 2016-03-02 DIAGNOSIS — E119 Type 2 diabetes mellitus without complications: Secondary | ICD-10-CM | POA: Diagnosis not present

## 2016-03-02 DIAGNOSIS — N186 End stage renal disease: Secondary | ICD-10-CM | POA: Diagnosis not present

## 2016-03-05 DIAGNOSIS — N186 End stage renal disease: Secondary | ICD-10-CM | POA: Diagnosis not present

## 2016-03-05 DIAGNOSIS — D631 Anemia in chronic kidney disease: Secondary | ICD-10-CM | POA: Diagnosis not present

## 2016-03-05 DIAGNOSIS — D509 Iron deficiency anemia, unspecified: Secondary | ICD-10-CM | POA: Diagnosis not present

## 2016-03-05 DIAGNOSIS — E119 Type 2 diabetes mellitus without complications: Secondary | ICD-10-CM | POA: Diagnosis not present

## 2016-03-05 DIAGNOSIS — N2581 Secondary hyperparathyroidism of renal origin: Secondary | ICD-10-CM | POA: Diagnosis not present

## 2016-03-07 DIAGNOSIS — N186 End stage renal disease: Secondary | ICD-10-CM | POA: Diagnosis not present

## 2016-03-07 DIAGNOSIS — D509 Iron deficiency anemia, unspecified: Secondary | ICD-10-CM | POA: Diagnosis not present

## 2016-03-07 DIAGNOSIS — D631 Anemia in chronic kidney disease: Secondary | ICD-10-CM | POA: Diagnosis not present

## 2016-03-07 DIAGNOSIS — N2581 Secondary hyperparathyroidism of renal origin: Secondary | ICD-10-CM | POA: Diagnosis not present

## 2016-03-07 DIAGNOSIS — E119 Type 2 diabetes mellitus without complications: Secondary | ICD-10-CM | POA: Diagnosis not present

## 2016-03-08 ENCOUNTER — Ambulatory Visit (INDEPENDENT_AMBULATORY_CARE_PROVIDER_SITE_OTHER): Payer: Medicare Other | Admitting: Neurology

## 2016-03-08 ENCOUNTER — Telehealth: Payer: Self-pay | Admitting: Neurology

## 2016-03-08 ENCOUNTER — Encounter: Payer: Self-pay | Admitting: Neurology

## 2016-03-08 VITALS — BP 92/60 | HR 91 | Ht 71.0 in | Wt 232.0 lb

## 2016-03-08 DIAGNOSIS — I6523 Occlusion and stenosis of bilateral carotid arteries: Secondary | ICD-10-CM

## 2016-03-08 DIAGNOSIS — Z992 Dependence on renal dialysis: Secondary | ICD-10-CM | POA: Diagnosis not present

## 2016-03-08 DIAGNOSIS — I6350 Cerebral infarction due to unspecified occlusion or stenosis of unspecified cerebral artery: Secondary | ICD-10-CM

## 2016-03-08 DIAGNOSIS — N186 End stage renal disease: Secondary | ICD-10-CM | POA: Diagnosis not present

## 2016-03-08 DIAGNOSIS — I120 Hypertensive chronic kidney disease with stage 5 chronic kidney disease or end stage renal disease: Secondary | ICD-10-CM | POA: Diagnosis not present

## 2016-03-08 NOTE — Telephone Encounter (Signed)
Patient is ready to be scheduled for Doppler. No PA needed. Thanks Dana .  °

## 2016-03-08 NOTE — Progress Notes (Signed)
[facility]  PATIENT: Shawn [name] DOB: 02/19/1961   REASON FOR VISIT: Follow-up for history of stroke, end-stage renal disease,  intracranial atherosclerosis HISTORY FROM: Patient alone at visit   HISTORY OF PRESENT ILLNESS:2/2/17[name], 55 year old male returns for followup. He has history of stroke which occurred in April 2012 he is currently on aspirin 325 without further stroke or TIA symptoms. He goes to dialysis 3 times a week. He had a revision of his AV shunt 01/18/15 but now has portacath in right chest. 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 He denies any recent falls , he ambulates with a single-point cane. He returns for reevaluation. He has vascular risk factors of  Diabetes and hyperlipidemia. His last hemoglobin A1c was 8.1 and he's had adjustments to his insulin, but he tells me today that his insulin is so expensive that there are days that he does not take the medication. He says he has told his primary care this. Last carotid Doppler was negative for significant stenosis and transcranial Doppler showed elevated velocities in the left middle cerebral artery suggestive of mild stenosis, globally elevated pulsatility indexes suggestive of diffuse intracranial atherosclerosis. His blood pressure 111/72 in the office this morning, he has dizziness when he does not drink enough fluids. 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 The patient presented with gait instability, vertigo and dizziness, which have resolved completely, except the patient still has some gait instability. But he can walk pretty well with a cane. 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 The patient had cataract surgery and retinal repair on both of his eyes before  the stroke. The patient has a PMH of end stage renal disease, and goes to dialysis 3x a week.  He Is seen urgently today following recent visit to [facility] emergency room on 03/04/12 for dizziness. He woke up that day feeling dizzy and off-balance and with a sensation of body being pulled to the right side. This was noticed mainly when he was in the upright position and he felt better when he sat down or lay down. He denied any vertigo, nausea, diplopia. He did have some minor tingling in his right hand. His blood pressure was recorded as 133/85 in the ER but they did not check orthostatics. CBC and BMP were unremarkable. Patient states he had similar but milder episode today's later following dialysis when his blood pressure was recorded being low in the 80s. Similarly symptoms were noticeable when he was upright and improved when he lays flat. He complains of difficulty walking and exercising particularly on the days he gets dialysis as he feels quite tired. He had lipid profile checked last month by Dr. Katherine Roan and LDL was elevated but he cannot give me the exact numbers and I do not have those results. He continues to have poor vision annd uses a cane to walk.  Update 08/10/2014 : He returns for follow-up after last visit on 09/29/13. He continues to do well from neurovascular standpoint without recurrent stroke or TIA symptoms. He is tolerating aspirin without significant bleeding or bruising. He had lipid profile checked 3 months ago by his primary physician and it was fine. His blood pressure usually runs quite low blood is low in office today.  His last hemoglobin A1c was 6.8 done a few months ago. He had follow-up carotid ultrasound done on 10/07/13 which I personally reviewed showed no significant extra: Stenosis. Transcranial Doppler studies showed elevated velocities in the left middle cerebral artery suggestive of mild stenosis. Globally elevated pulsatility indexes and suggested diffuse intracranial  atherosclerosis. He continues to have poor vision in his eyes from diabetic retinopathy and uses a cane to ambulate.   Update 03/08/2016 :  He returns for follow-up after last visit a year and a half ago. Patient continues to be legally blind and uses a cane and states is careful and is on no major falls or injuries. She remains on aspirin as tolerated well without bleeding or bruising. He's had no further recurrent stroke or TIA symptoms. He does have a lot of dizziness and lightheadedness. He in fact was hospitalized last year with a syncopal episode following dialysis. His blood pressure continues to run low. He has been started on ProAmatine which she takes only when necessary if his systolic blood pressures below 90. He states his sugars are doing well. His primary physician managing his diabetes and is currently on insulin. He states he remains on Lipitor and WelChol but his cholesterol yet is not adequately controlled. He has no new neurological complaints.  REVIEW OF SYSTEMS: Full 14 system review of systems performed and notable only for those listed, all others are neg:   Vision loss, gait difficulty and all the systems negative  ALLERGIES: No Known Allergies  HOME MEDICATIONS: Outpatient Medications Prior to Visit  Medication Sig Dispense Refill  . acetaminophen (TYLENOL) 325 MG tablet Take 650 mg by mouth every 6 (six) hours as needed (pain).    Marland Kitchen aspirin 325 MG tablet Take 325 mg by mouth daily.    Marland Kitchen atorvastatin (LIPITOR) 20 MG tablet Take 20 mg by mouth daily.    . B Complex Vitamins (B COMPLEX-B12) TABS Take 1 tablet by mouth daily.    . cinacalcet (SENSIPAR) 30 MG tablet Take 30 mg by mouth daily with supper.    . colesevelam (WELCHOL) 625 MG tablet Take 1,250 mg by mouth daily with supper.    . dorzolamide-timolol (COSOPT) 22.3-6.8 MG/ML ophthalmic solution Place 1 drop into the left eye 2 (two) times daily.    . insulin NPH-insulin regular (NOVOLIN 70/30) (70-30) 100 UNIT/ML  injection Inject 15 Units into the skin 2 (two) times daily with a meal.    . lanthanum (FOSRENOL) 1000 MG chewable tablet Chew 1,000 mg by mouth 3 (three) times daily with meals.    . latanoprost (XALATAN) 0.005 % ophthalmic solution Place 1 drop into the left eye at bedtime.     No facility-administered medications prior to visit.     PAST MEDICAL HISTORY: Past Medical History:  Diagnosis Date  . Anemia   . Arthritis    patient denies  . Blind right eye   . Chronic kidney disease    Dialysis since 2011 MWF  . Complication of anesthesia   . Diabetes mellitus    type II  . Hypertension   . Legally blind   . No pertinent past medical history   . PONV (postoperative nausea and vomiting)    N/V- became dehydrated had to have IV fluids- 01/2015  . Shortness of breath dyspnea    when has too fluid  . Stroke [facility]) 2012   date per patient  . Syncope   . Unspecified cerebral artery occlusion with cerebral infarction 04/02/2013  PAST SURGICAL HISTORY: Past Surgical History:  Procedure Laterality Date  . AV FISTULA PLACEMENT Right 05/12/2013   Procedure: INSERTION OF ARTERIOVENOUS (AV) GORE-TEX GRAFT ARM; ULTRASOUND GUIDED;  Surgeon: Conrad Agar, MD;  Location: [place];  Service: Vascular;  Laterality: Right;  . AV FISTULA PLACEMENT Left 01/18/2015   Procedure: INSERTION OF ARTERIOVENOUS GORE-TEX GRAFT LEFT UPPER ARM;  Surgeon: Conrad Millville, MD;  Location: [place];  Service: Vascular;  Laterality: Left;  . COLONOSCOPY  06/2012  . EYE SURGERY Bilateral    retina surgery both eyes, cataract surgery both eyes  . HERNIA REPAIR     right inguinal  . INSERTION OF DIALYSIS CATHETER Right   . left arm graft  10/2010  . LIGATION ARTERIOVENOUS GORTEX GRAFT Left 05/03/2015   Procedure: LIGATION ARTERIOVENOUS GORTEX GRAFT-LEFT UPPER ARM;  Surgeon: Conrad Torrington, MD;  Location: [facility];  Service: Vascular;  Laterality: Left;    FAMILY HISTORY: Family History  Problem Relation Age of Onset  .  Diabetes Mother   . Alzheimer's disease Mother   . Cancer Father   . Heart disease Father   . Cancer Sister   . Stroke Sister     SOCIAL HISTORY: Social History   Social History  . Marital status: Married    Spouse name: N/A  . Number of children: 0  . Years of education: 12th   Occupational History  .  Unemployed   Social History Main Topics  . Smoking status: Never Smoker  . Smokeless tobacco: Never Used  . Alcohol use No  . Drug use: No  . Sexual activity: Not on file   Other Topics Concern  . Not on file   Social History Narrative   Patient lives at home with wife.    Patient is disabled.    Patient has no children.    Patient has a 12 grade education.            PHYSICAL EXAM  Vitals:   03/08/16 1050  BP: 92/60  Pulse: 91  Weight: 232 lb (105.2 kg)  Height: 5\' 11"  (1.803 m)   Body mass index is 32.36 kg/m. Generalized:  Frail middle aged african [place] male not in distress., in no acute distress  Head: normocephalic and atraumatic,.   Neck: Supple, no carotid bruits  Cardiac: Regular rate rhythm, no murmur  Lungs clear to auscultation   Neurological examination   Mentation: Alert oriented to time, place, history taking. Follows all commands speech and language fluent  Cranial nerve II-XII: Pupils are enlarged right greater than left and neither react. Conjugate eye movements are restricted on the left just moving medial and movement on the right, visual fields are restricted to confrontation on the left visual field testing central and medial vision field loss laterally. Right full vision loss Facial sensation and strength were normal. hearing was intact to finger rubbing bilaterally. Uvula tongue midline. head turning and shoulder shrug were normal and symmetric.Tongue protrusion into cheek strength was normal. Motor: normal bulk and tone, full strength in the BUE, BLE, fine finger movements normal, no pronator drift. No focal  weakness Sensory: normal and symmetric to light touch, pinprick, and vibration  Coordination: Unable due to visual deficits Reflexes: 1+ upper and lower and symmetric Gait and Station: Wide-based gait with cane, unsteady with tandem uses a cane   DIAGNOSTIC DATA (LABS, IMAGING, TESTING) - I reviewed patient records, labs, notes, testing and imaging myself where available.  Lab Results  Component Value  Date   WBC 7.3 01/23/2016   HGB 14.1 01/23/2016   HCT 44.5 01/23/2016   MCV 92.7 01/23/2016   PLT 240 01/23/2016      Component Value Date/Time   NA 133 (L) 01/23/2016 1151   K 4.5 01/23/2016 1151   CL 95 (L) 01/23/2016 1151   CO2 26 01/23/2016 1151   GLUCOSE 190 (H) 01/23/2016 1151   BUN 16 01/23/2016 1151   CREATININE 6.80 (H) 01/23/2016 1151   CALCIUM 8.6 (L) 01/23/2016 1151   CALCIUM 7.7 (L) 01/28/2010 1225   PROT 7.5 01/18/2015 1844   ALBUMIN 3.6 01/18/2015 1844   AST 18 01/18/2015 1844   ALT 14 (L) 01/18/2015 1844   ALKPHOS 113 01/18/2015 1844   BILITOT 0.7 01/18/2015 1844   GFRNONAA 8 (L) 01/23/2016 1151   GFRAA 10 (L) 01/23/2016 1151        ASSESSMENT AND PLAN 55 y.o. year old male has a past medical history of Stroke; Diabetes mellitus; Arthritis; Blind right eye; Unspecified cerebral artery occlusion with cerebral infarction (04/02/2013); Chronic kidney disease; Legally blind; and Anemia here to follow-up.  I had a long d/w patient about his remote stroke,intracranial stenosis risk for recurrent stroke/TIAs, personally independently reviewed imaging studies and stroke evaluation results and answered questions.Continue aspirin 325 mg daily  for secondary stroke prevention and maintain strict control of hypertension with blood pressure goal below 130/90, diabetes with hemoglobin A1c goal below 6.5% and lipids with LDL cholesterol goal below 70 mg/dL. I also advised the patient to eat a healthy diet with plenty of whole grains, cereals, fruits and vegetables,  exercise regularly and maintain ideal body weight .I advised him to be careful with his walking due to his legal blindness and to use a cane at all times to avoid falls and injuries. Check follow-up carotid ultrasound and transcranial Doppler studies. Followup in the future with me in  one year or call earlier if necessary  Antony Contras, MD [facility] [address], [place], [place] [address] [phone]

## 2016-03-08 NOTE — Patient Instructions (Signed)
I had a long d/w patient about his remote stroke,intracranial stenosis risk for recurrent stroke/TIAs, personally independently reviewed imaging studies and stroke evaluation results and answered questions.Continue aspirin 325 mg daily  for secondary stroke prevention and maintain strict control of hypertension with blood pressure goal below 130/90, diabetes with hemoglobin A1c goal below 6.5% and lipids with LDL cholesterol goal below 70 mg/dL. I also advised the patient to eat a healthy diet with plenty of whole grains, cereals, fruits and vegetables, exercise regularly and maintain ideal body weight .I advised him to be careful with his walking due to his legal blindness and to use a cane at all times to avoid falls and injuries. Check follow-up carotid ultrasound and transcranial Doppler studies. Followup in the future with me in  one year or call earlier if necessary

## 2016-03-09 DIAGNOSIS — N2581 Secondary hyperparathyroidism of renal origin: Secondary | ICD-10-CM | POA: Diagnosis not present

## 2016-03-09 DIAGNOSIS — D509 Iron deficiency anemia, unspecified: Secondary | ICD-10-CM | POA: Diagnosis not present

## 2016-03-09 DIAGNOSIS — Z23 Encounter for immunization: Secondary | ICD-10-CM | POA: Diagnosis not present

## 2016-03-09 DIAGNOSIS — E119 Type 2 diabetes mellitus without complications: Secondary | ICD-10-CM | POA: Diagnosis not present

## 2016-03-09 DIAGNOSIS — N186 End stage renal disease: Secondary | ICD-10-CM | POA: Diagnosis not present

## 2016-03-09 DIAGNOSIS — D631 Anemia in chronic kidney disease: Secondary | ICD-10-CM | POA: Diagnosis not present

## 2016-03-12 DIAGNOSIS — N2581 Secondary hyperparathyroidism of renal origin: Secondary | ICD-10-CM | POA: Diagnosis not present

## 2016-03-12 DIAGNOSIS — Z23 Encounter for immunization: Secondary | ICD-10-CM | POA: Diagnosis not present

## 2016-03-12 DIAGNOSIS — E119 Type 2 diabetes mellitus without complications: Secondary | ICD-10-CM | POA: Diagnosis not present

## 2016-03-12 DIAGNOSIS — N186 End stage renal disease: Secondary | ICD-10-CM | POA: Diagnosis not present

## 2016-03-12 DIAGNOSIS — D631 Anemia in chronic kidney disease: Secondary | ICD-10-CM | POA: Diagnosis not present

## 2016-03-12 DIAGNOSIS — D509 Iron deficiency anemia, unspecified: Secondary | ICD-10-CM | POA: Diagnosis not present

## 2016-03-14 DIAGNOSIS — N186 End stage renal disease: Secondary | ICD-10-CM | POA: Diagnosis not present

## 2016-03-14 DIAGNOSIS — N2581 Secondary hyperparathyroidism of renal origin: Secondary | ICD-10-CM | POA: Diagnosis not present

## 2016-03-14 DIAGNOSIS — Z23 Encounter for immunization: Secondary | ICD-10-CM | POA: Diagnosis not present

## 2016-03-14 DIAGNOSIS — D631 Anemia in chronic kidney disease: Secondary | ICD-10-CM | POA: Diagnosis not present

## 2016-03-14 DIAGNOSIS — E119 Type 2 diabetes mellitus without complications: Secondary | ICD-10-CM | POA: Diagnosis not present

## 2016-03-14 DIAGNOSIS — D509 Iron deficiency anemia, unspecified: Secondary | ICD-10-CM | POA: Diagnosis not present

## 2016-03-16 DIAGNOSIS — E119 Type 2 diabetes mellitus without complications: Secondary | ICD-10-CM | POA: Diagnosis not present

## 2016-03-16 DIAGNOSIS — Z23 Encounter for immunization: Secondary | ICD-10-CM | POA: Diagnosis not present

## 2016-03-16 DIAGNOSIS — N186 End stage renal disease: Secondary | ICD-10-CM | POA: Diagnosis not present

## 2016-03-16 DIAGNOSIS — N2581 Secondary hyperparathyroidism of renal origin: Secondary | ICD-10-CM | POA: Diagnosis not present

## 2016-03-16 DIAGNOSIS — D631 Anemia in chronic kidney disease: Secondary | ICD-10-CM | POA: Diagnosis not present

## 2016-03-16 DIAGNOSIS — D509 Iron deficiency anemia, unspecified: Secondary | ICD-10-CM | POA: Diagnosis not present

## 2016-03-19 DIAGNOSIS — E119 Type 2 diabetes mellitus without complications: Secondary | ICD-10-CM | POA: Diagnosis not present

## 2016-03-19 DIAGNOSIS — D509 Iron deficiency anemia, unspecified: Secondary | ICD-10-CM | POA: Diagnosis not present

## 2016-03-19 DIAGNOSIS — N186 End stage renal disease: Secondary | ICD-10-CM | POA: Diagnosis not present

## 2016-03-19 DIAGNOSIS — D631 Anemia in chronic kidney disease: Secondary | ICD-10-CM | POA: Diagnosis not present

## 2016-03-19 DIAGNOSIS — N2581 Secondary hyperparathyroidism of renal origin: Secondary | ICD-10-CM | POA: Diagnosis not present

## 2016-03-19 DIAGNOSIS — Z23 Encounter for immunization: Secondary | ICD-10-CM | POA: Diagnosis not present

## 2016-03-20 DIAGNOSIS — N2581 Secondary hyperparathyroidism of renal origin: Secondary | ICD-10-CM | POA: Diagnosis not present

## 2016-03-20 DIAGNOSIS — Z23 Encounter for immunization: Secondary | ICD-10-CM | POA: Diagnosis not present

## 2016-03-20 DIAGNOSIS — D631 Anemia in chronic kidney disease: Secondary | ICD-10-CM | POA: Diagnosis not present

## 2016-03-20 DIAGNOSIS — D509 Iron deficiency anemia, unspecified: Secondary | ICD-10-CM | POA: Diagnosis not present

## 2016-03-20 DIAGNOSIS — E119 Type 2 diabetes mellitus without complications: Secondary | ICD-10-CM | POA: Diagnosis not present

## 2016-03-20 DIAGNOSIS — N186 End stage renal disease: Secondary | ICD-10-CM | POA: Diagnosis not present

## 2016-03-22 ENCOUNTER — Ambulatory Visit (INDEPENDENT_AMBULATORY_CARE_PROVIDER_SITE_OTHER): Payer: Medicare Other

## 2016-03-22 DIAGNOSIS — Z0289 Encounter for other administrative examinations: Secondary | ICD-10-CM

## 2016-03-22 DIAGNOSIS — I6523 Occlusion and stenosis of bilateral carotid arteries: Secondary | ICD-10-CM

## 2016-03-23 DIAGNOSIS — N186 End stage renal disease: Secondary | ICD-10-CM | POA: Diagnosis not present

## 2016-03-23 DIAGNOSIS — N2581 Secondary hyperparathyroidism of renal origin: Secondary | ICD-10-CM | POA: Diagnosis not present

## 2016-03-23 DIAGNOSIS — Z23 Encounter for immunization: Secondary | ICD-10-CM | POA: Diagnosis not present

## 2016-03-23 DIAGNOSIS — E119 Type 2 diabetes mellitus without complications: Secondary | ICD-10-CM | POA: Diagnosis not present

## 2016-03-23 DIAGNOSIS — D631 Anemia in chronic kidney disease: Secondary | ICD-10-CM | POA: Diagnosis not present

## 2016-03-23 DIAGNOSIS — D509 Iron deficiency anemia, unspecified: Secondary | ICD-10-CM | POA: Diagnosis not present

## 2016-03-26 DIAGNOSIS — E119 Type 2 diabetes mellitus without complications: Secondary | ICD-10-CM | POA: Diagnosis not present

## 2016-03-26 DIAGNOSIS — D631 Anemia in chronic kidney disease: Secondary | ICD-10-CM | POA: Diagnosis not present

## 2016-03-26 DIAGNOSIS — Z23 Encounter for immunization: Secondary | ICD-10-CM | POA: Diagnosis not present

## 2016-03-26 DIAGNOSIS — N2581 Secondary hyperparathyroidism of renal origin: Secondary | ICD-10-CM | POA: Diagnosis not present

## 2016-03-26 DIAGNOSIS — D509 Iron deficiency anemia, unspecified: Secondary | ICD-10-CM | POA: Diagnosis not present

## 2016-03-26 DIAGNOSIS — N186 End stage renal disease: Secondary | ICD-10-CM | POA: Diagnosis not present

## 2016-03-27 ENCOUNTER — Telehealth: Payer: Self-pay

## 2016-03-27 NOTE — Telephone Encounter (Signed)
Rn spoke with Dr. Leonie Man. Dr.Sethi review the carotid duplex study. Dr. Leonie Man stated the test is negative. There are no new significant changes from the last one from last year.

## 2016-03-28 DIAGNOSIS — D509 Iron deficiency anemia, unspecified: Secondary | ICD-10-CM | POA: Diagnosis not present

## 2016-03-28 DIAGNOSIS — D631 Anemia in chronic kidney disease: Secondary | ICD-10-CM | POA: Diagnosis not present

## 2016-03-28 DIAGNOSIS — Z23 Encounter for immunization: Secondary | ICD-10-CM | POA: Diagnosis not present

## 2016-03-28 DIAGNOSIS — N2581 Secondary hyperparathyroidism of renal origin: Secondary | ICD-10-CM | POA: Diagnosis not present

## 2016-03-28 DIAGNOSIS — N186 End stage renal disease: Secondary | ICD-10-CM | POA: Diagnosis not present

## 2016-03-28 DIAGNOSIS — E119 Type 2 diabetes mellitus without complications: Secondary | ICD-10-CM | POA: Diagnosis not present

## 2016-03-28 NOTE — Telephone Encounter (Signed)
Rn call patient about his carotid doppler test. Rn stated per Dr. Leonie Man it was negative, no significant changes from last year. Pt verbalized understanding.

## 2016-03-30 DIAGNOSIS — D631 Anemia in chronic kidney disease: Secondary | ICD-10-CM | POA: Diagnosis not present

## 2016-03-30 DIAGNOSIS — N2581 Secondary hyperparathyroidism of renal origin: Secondary | ICD-10-CM | POA: Diagnosis not present

## 2016-03-30 DIAGNOSIS — E119 Type 2 diabetes mellitus without complications: Secondary | ICD-10-CM | POA: Diagnosis not present

## 2016-03-30 DIAGNOSIS — Z23 Encounter for immunization: Secondary | ICD-10-CM | POA: Diagnosis not present

## 2016-03-30 DIAGNOSIS — N186 End stage renal disease: Secondary | ICD-10-CM | POA: Diagnosis not present

## 2016-03-30 DIAGNOSIS — D509 Iron deficiency anemia, unspecified: Secondary | ICD-10-CM | POA: Diagnosis not present

## 2016-04-02 DIAGNOSIS — N186 End stage renal disease: Secondary | ICD-10-CM | POA: Diagnosis not present

## 2016-04-02 DIAGNOSIS — E119 Type 2 diabetes mellitus without complications: Secondary | ICD-10-CM | POA: Diagnosis not present

## 2016-04-02 DIAGNOSIS — D631 Anemia in chronic kidney disease: Secondary | ICD-10-CM | POA: Diagnosis not present

## 2016-04-02 DIAGNOSIS — D509 Iron deficiency anemia, unspecified: Secondary | ICD-10-CM | POA: Diagnosis not present

## 2016-04-02 DIAGNOSIS — N2581 Secondary hyperparathyroidism of renal origin: Secondary | ICD-10-CM | POA: Diagnosis not present

## 2016-04-02 DIAGNOSIS — Z23 Encounter for immunization: Secondary | ICD-10-CM | POA: Diagnosis not present

## 2016-04-04 DIAGNOSIS — D509 Iron deficiency anemia, unspecified: Secondary | ICD-10-CM | POA: Diagnosis not present

## 2016-04-04 DIAGNOSIS — N2581 Secondary hyperparathyroidism of renal origin: Secondary | ICD-10-CM | POA: Diagnosis not present

## 2016-04-04 DIAGNOSIS — D631 Anemia in chronic kidney disease: Secondary | ICD-10-CM | POA: Diagnosis not present

## 2016-04-04 DIAGNOSIS — E119 Type 2 diabetes mellitus without complications: Secondary | ICD-10-CM | POA: Diagnosis not present

## 2016-04-04 DIAGNOSIS — Z23 Encounter for immunization: Secondary | ICD-10-CM | POA: Diagnosis not present

## 2016-04-04 DIAGNOSIS — N186 End stage renal disease: Secondary | ICD-10-CM | POA: Diagnosis not present

## 2016-04-05 ENCOUNTER — Ambulatory Visit: Payer: Medicare Other | Admitting: Podiatry

## 2016-04-06 DIAGNOSIS — N2581 Secondary hyperparathyroidism of renal origin: Secondary | ICD-10-CM | POA: Diagnosis not present

## 2016-04-06 DIAGNOSIS — Z23 Encounter for immunization: Secondary | ICD-10-CM | POA: Diagnosis not present

## 2016-04-06 DIAGNOSIS — E119 Type 2 diabetes mellitus without complications: Secondary | ICD-10-CM | POA: Diagnosis not present

## 2016-04-06 DIAGNOSIS — D631 Anemia in chronic kidney disease: Secondary | ICD-10-CM | POA: Diagnosis not present

## 2016-04-06 DIAGNOSIS — D509 Iron deficiency anemia, unspecified: Secondary | ICD-10-CM | POA: Diagnosis not present

## 2016-04-06 DIAGNOSIS — N186 End stage renal disease: Secondary | ICD-10-CM | POA: Diagnosis not present

## 2016-04-07 DIAGNOSIS — Z992 Dependence on renal dialysis: Secondary | ICD-10-CM | POA: Diagnosis not present

## 2016-04-07 DIAGNOSIS — I120 Hypertensive chronic kidney disease with stage 5 chronic kidney disease or end stage renal disease: Secondary | ICD-10-CM | POA: Diagnosis not present

## 2016-04-07 DIAGNOSIS — N186 End stage renal disease: Secondary | ICD-10-CM | POA: Diagnosis not present

## 2016-04-09 DIAGNOSIS — N186 End stage renal disease: Secondary | ICD-10-CM | POA: Diagnosis not present

## 2016-04-09 DIAGNOSIS — D631 Anemia in chronic kidney disease: Secondary | ICD-10-CM | POA: Diagnosis not present

## 2016-04-09 DIAGNOSIS — N2581 Secondary hyperparathyroidism of renal origin: Secondary | ICD-10-CM | POA: Diagnosis not present

## 2016-04-09 DIAGNOSIS — D509 Iron deficiency anemia, unspecified: Secondary | ICD-10-CM | POA: Diagnosis not present

## 2016-04-11 DIAGNOSIS — N2581 Secondary hyperparathyroidism of renal origin: Secondary | ICD-10-CM | POA: Diagnosis not present

## 2016-04-11 DIAGNOSIS — N186 End stage renal disease: Secondary | ICD-10-CM | POA: Diagnosis not present

## 2016-04-11 DIAGNOSIS — D509 Iron deficiency anemia, unspecified: Secondary | ICD-10-CM | POA: Diagnosis not present

## 2016-04-11 DIAGNOSIS — D631 Anemia in chronic kidney disease: Secondary | ICD-10-CM | POA: Diagnosis not present

## 2016-04-12 ENCOUNTER — Encounter: Payer: Self-pay | Admitting: Podiatry

## 2016-04-12 ENCOUNTER — Ambulatory Visit (INDEPENDENT_AMBULATORY_CARE_PROVIDER_SITE_OTHER): Payer: Medicare Other | Admitting: Podiatry

## 2016-04-12 DIAGNOSIS — M79676 Pain in unspecified toe(s): Secondary | ICD-10-CM

## 2016-04-12 DIAGNOSIS — E1140 Type 2 diabetes mellitus with diabetic neuropathy, unspecified: Secondary | ICD-10-CM

## 2016-04-12 DIAGNOSIS — B351 Tinea unguium: Secondary | ICD-10-CM

## 2016-04-12 NOTE — Progress Notes (Signed)
Patient ID: Shawn [name], male   DOB: Apr 02, 1961, 55 y.o.   MRN: [id number] Complaint:  Visit Type: Patient returns to my office for continued preventative foot care services. Complaint: Patient states" my nails have grown long and thick and become painful to walk and wear shoes" Patient has been diagnosed with DM with neuropathy and angiopathy.. The patient presents for preventative foot care services. No changes to ROS  Podiatric Exam: Vascular: dorsalis pedis and posterior tibial pulses are palpable bilateral. Capillary return is immediate. Temperature gradient is WNL. Skin turgor WNL  Sensorium: Diminished Semmes Weinstein monofilament test. Normal tactile sensation bilaterally. Nail Exam: Pt has thick disfigured discolored nails with subungual debris noted bilateral entire nail hallux through fifth toenails Ulcer Exam: There is no evidence of ulcer or pre-ulcerative changes or infection. Orthopedic Exam: Muscle tone and strength are WNL. No limitations in general ROM. No crepitus or effusions noted. Foot type and digits show no abnormalities. Bony prominences are unremarkable. Skin: No Porokeratosis. No infection or ulcers.  Healing skin lesion left hallux.  No signs of redness or infection or swelling.  Diagnosis:  Onychomycosis, , Pain in right toe, pain in left toes  Treatment & Plan Procedures and Treatment: Consent by patient was obtained for treatment procedures. The patient understood the discussion of treatment and procedures well. All questions were answered thoroughly reviewed. Debridement of mycotic and hypertrophic toenails, 1 through 5 bilateral and clearing of subungual debris. No ulceration, no infection noted.  Return Visit-Office Procedure: Patient instructed to return to the office for a follow up visit 3 months for continued evaluation and treatment.  Gardiner Barefoot DPM

## 2016-04-13 DIAGNOSIS — D509 Iron deficiency anemia, unspecified: Secondary | ICD-10-CM | POA: Diagnosis not present

## 2016-04-13 DIAGNOSIS — D631 Anemia in chronic kidney disease: Secondary | ICD-10-CM | POA: Diagnosis not present

## 2016-04-13 DIAGNOSIS — N186 End stage renal disease: Secondary | ICD-10-CM | POA: Diagnosis not present

## 2016-04-13 DIAGNOSIS — N2581 Secondary hyperparathyroidism of renal origin: Secondary | ICD-10-CM | POA: Diagnosis not present

## 2016-04-16 DIAGNOSIS — N2581 Secondary hyperparathyroidism of renal origin: Secondary | ICD-10-CM | POA: Diagnosis not present

## 2016-04-16 DIAGNOSIS — D631 Anemia in chronic kidney disease: Secondary | ICD-10-CM | POA: Diagnosis not present

## 2016-04-16 DIAGNOSIS — N186 End stage renal disease: Secondary | ICD-10-CM | POA: Diagnosis not present

## 2016-04-16 DIAGNOSIS — D509 Iron deficiency anemia, unspecified: Secondary | ICD-10-CM | POA: Diagnosis not present

## 2016-04-18 DIAGNOSIS — D509 Iron deficiency anemia, unspecified: Secondary | ICD-10-CM | POA: Diagnosis not present

## 2016-04-18 DIAGNOSIS — D631 Anemia in chronic kidney disease: Secondary | ICD-10-CM | POA: Diagnosis not present

## 2016-04-18 DIAGNOSIS — N186 End stage renal disease: Secondary | ICD-10-CM | POA: Diagnosis not present

## 2016-04-18 DIAGNOSIS — N2581 Secondary hyperparathyroidism of renal origin: Secondary | ICD-10-CM | POA: Diagnosis not present

## 2016-04-20 ENCOUNTER — Telehealth: Payer: Self-pay

## 2016-04-20 DIAGNOSIS — D631 Anemia in chronic kidney disease: Secondary | ICD-10-CM | POA: Diagnosis not present

## 2016-04-20 DIAGNOSIS — N2581 Secondary hyperparathyroidism of renal origin: Secondary | ICD-10-CM | POA: Diagnosis not present

## 2016-04-20 DIAGNOSIS — N186 End stage renal disease: Secondary | ICD-10-CM | POA: Diagnosis not present

## 2016-04-20 DIAGNOSIS — D509 Iron deficiency anemia, unspecified: Secondary | ICD-10-CM | POA: Diagnosis not present

## 2016-04-20 NOTE — Telephone Encounter (Signed)
Per Dr.Sethi's review of TCD. The results are mild narrowing of left middle cerebral artery and hardening of arteries. No worrisome findings.

## 2016-04-23 DIAGNOSIS — N2581 Secondary hyperparathyroidism of renal origin: Secondary | ICD-10-CM | POA: Diagnosis not present

## 2016-04-23 DIAGNOSIS — D509 Iron deficiency anemia, unspecified: Secondary | ICD-10-CM | POA: Diagnosis not present

## 2016-04-23 DIAGNOSIS — D631 Anemia in chronic kidney disease: Secondary | ICD-10-CM | POA: Diagnosis not present

## 2016-04-23 DIAGNOSIS — N186 End stage renal disease: Secondary | ICD-10-CM | POA: Diagnosis not present

## 2016-04-23 NOTE — Telephone Encounter (Signed)
Rn call patient about the TCD doppler. Rn stated the results are mild narrowing of left middle cerebral artery. Mild hardening of the arteries. No worrisome findings. Pt verbalized understanding.

## 2016-04-25 DIAGNOSIS — N2581 Secondary hyperparathyroidism of renal origin: Secondary | ICD-10-CM | POA: Diagnosis not present

## 2016-04-25 DIAGNOSIS — N186 End stage renal disease: Secondary | ICD-10-CM | POA: Diagnosis not present

## 2016-04-25 DIAGNOSIS — D631 Anemia in chronic kidney disease: Secondary | ICD-10-CM | POA: Diagnosis not present

## 2016-04-25 DIAGNOSIS — D509 Iron deficiency anemia, unspecified: Secondary | ICD-10-CM | POA: Diagnosis not present

## 2016-04-27 DIAGNOSIS — N2581 Secondary hyperparathyroidism of renal origin: Secondary | ICD-10-CM | POA: Diagnosis not present

## 2016-04-27 DIAGNOSIS — N186 End stage renal disease: Secondary | ICD-10-CM | POA: Diagnosis not present

## 2016-04-27 DIAGNOSIS — D631 Anemia in chronic kidney disease: Secondary | ICD-10-CM | POA: Diagnosis not present

## 2016-04-27 DIAGNOSIS — D509 Iron deficiency anemia, unspecified: Secondary | ICD-10-CM | POA: Diagnosis not present

## 2016-04-30 DIAGNOSIS — D631 Anemia in chronic kidney disease: Secondary | ICD-10-CM | POA: Diagnosis not present

## 2016-04-30 DIAGNOSIS — D509 Iron deficiency anemia, unspecified: Secondary | ICD-10-CM | POA: Diagnosis not present

## 2016-04-30 DIAGNOSIS — N2581 Secondary hyperparathyroidism of renal origin: Secondary | ICD-10-CM | POA: Diagnosis not present

## 2016-04-30 DIAGNOSIS — N186 End stage renal disease: Secondary | ICD-10-CM | POA: Diagnosis not present

## 2016-05-02 DIAGNOSIS — D631 Anemia in chronic kidney disease: Secondary | ICD-10-CM | POA: Diagnosis not present

## 2016-05-02 DIAGNOSIS — E1129 Type 2 diabetes mellitus with other diabetic kidney complication: Secondary | ICD-10-CM | POA: Diagnosis not present

## 2016-05-02 DIAGNOSIS — D509 Iron deficiency anemia, unspecified: Secondary | ICD-10-CM | POA: Diagnosis not present

## 2016-05-02 DIAGNOSIS — N2581 Secondary hyperparathyroidism of renal origin: Secondary | ICD-10-CM | POA: Diagnosis not present

## 2016-05-02 DIAGNOSIS — N186 End stage renal disease: Secondary | ICD-10-CM | POA: Diagnosis not present

## 2016-05-04 DIAGNOSIS — D509 Iron deficiency anemia, unspecified: Secondary | ICD-10-CM | POA: Diagnosis not present

## 2016-05-04 DIAGNOSIS — N186 End stage renal disease: Secondary | ICD-10-CM | POA: Diagnosis not present

## 2016-05-04 DIAGNOSIS — D631 Anemia in chronic kidney disease: Secondary | ICD-10-CM | POA: Diagnosis not present

## 2016-05-04 DIAGNOSIS — N2581 Secondary hyperparathyroidism of renal origin: Secondary | ICD-10-CM | POA: Diagnosis not present

## 2016-05-07 DIAGNOSIS — N186 End stage renal disease: Secondary | ICD-10-CM | POA: Diagnosis not present

## 2016-05-07 DIAGNOSIS — N2581 Secondary hyperparathyroidism of renal origin: Secondary | ICD-10-CM | POA: Diagnosis not present

## 2016-05-07 DIAGNOSIS — D631 Anemia in chronic kidney disease: Secondary | ICD-10-CM | POA: Diagnosis not present

## 2016-05-07 DIAGNOSIS — D509 Iron deficiency anemia, unspecified: Secondary | ICD-10-CM | POA: Diagnosis not present

## 2016-05-08 DIAGNOSIS — I120 Hypertensive chronic kidney disease with stage 5 chronic kidney disease or end stage renal disease: Secondary | ICD-10-CM | POA: Diagnosis not present

## 2016-05-08 DIAGNOSIS — N186 End stage renal disease: Secondary | ICD-10-CM | POA: Diagnosis not present

## 2016-05-08 DIAGNOSIS — Z992 Dependence on renal dialysis: Secondary | ICD-10-CM | POA: Diagnosis not present

## 2016-05-09 DIAGNOSIS — Z23 Encounter for immunization: Secondary | ICD-10-CM | POA: Diagnosis not present

## 2016-05-09 DIAGNOSIS — N186 End stage renal disease: Secondary | ICD-10-CM | POA: Diagnosis not present

## 2016-05-09 DIAGNOSIS — D631 Anemia in chronic kidney disease: Secondary | ICD-10-CM | POA: Diagnosis not present

## 2016-05-09 DIAGNOSIS — N2581 Secondary hyperparathyroidism of renal origin: Secondary | ICD-10-CM | POA: Diagnosis not present

## 2016-05-09 DIAGNOSIS — E119 Type 2 diabetes mellitus without complications: Secondary | ICD-10-CM | POA: Diagnosis not present

## 2016-05-09 DIAGNOSIS — D509 Iron deficiency anemia, unspecified: Secondary | ICD-10-CM | POA: Diagnosis not present

## 2016-05-11 DIAGNOSIS — D509 Iron deficiency anemia, unspecified: Secondary | ICD-10-CM | POA: Diagnosis not present

## 2016-05-11 DIAGNOSIS — D631 Anemia in chronic kidney disease: Secondary | ICD-10-CM | POA: Diagnosis not present

## 2016-05-11 DIAGNOSIS — N186 End stage renal disease: Secondary | ICD-10-CM | POA: Diagnosis not present

## 2016-05-11 DIAGNOSIS — N2581 Secondary hyperparathyroidism of renal origin: Secondary | ICD-10-CM | POA: Diagnosis not present

## 2016-05-11 DIAGNOSIS — Z23 Encounter for immunization: Secondary | ICD-10-CM | POA: Diagnosis not present

## 2016-05-11 DIAGNOSIS — E119 Type 2 diabetes mellitus without complications: Secondary | ICD-10-CM | POA: Diagnosis not present

## 2016-05-14 DIAGNOSIS — Z23 Encounter for immunization: Secondary | ICD-10-CM | POA: Diagnosis not present

## 2016-05-14 DIAGNOSIS — E119 Type 2 diabetes mellitus without complications: Secondary | ICD-10-CM | POA: Diagnosis not present

## 2016-05-14 DIAGNOSIS — D631 Anemia in chronic kidney disease: Secondary | ICD-10-CM | POA: Diagnosis not present

## 2016-05-14 DIAGNOSIS — D509 Iron deficiency anemia, unspecified: Secondary | ICD-10-CM | POA: Diagnosis not present

## 2016-05-14 DIAGNOSIS — N2581 Secondary hyperparathyroidism of renal origin: Secondary | ICD-10-CM | POA: Diagnosis not present

## 2016-05-14 DIAGNOSIS — N186 End stage renal disease: Secondary | ICD-10-CM | POA: Diagnosis not present

## 2016-05-15 ENCOUNTER — Encounter (HOSPITAL_COMMUNITY): Payer: Self-pay | Admitting: *Deleted

## 2016-05-15 ENCOUNTER — Emergency Department (HOSPITAL_COMMUNITY): Payer: Medicare Other

## 2016-05-15 ENCOUNTER — Emergency Department (HOSPITAL_COMMUNITY)
Admission: EM | Admit: 2016-05-15 | Discharge: 2016-05-15 | Disposition: A | Discharge status: Home / Self Care | Payer: Medicare Other | Attending: Emergency Medicine | Admitting: Emergency Medicine

## 2016-05-15 DIAGNOSIS — Z794 Long term (current) use of insulin: Secondary | ICD-10-CM | POA: Insufficient documentation

## 2016-05-15 DIAGNOSIS — Z8673 Personal history of transient ischemic attack (TIA), and cerebral infarction without residual deficits: Secondary | ICD-10-CM | POA: Diagnosis not present

## 2016-05-15 DIAGNOSIS — Z992 Dependence on renal dialysis: Secondary | ICD-10-CM | POA: Insufficient documentation

## 2016-05-15 DIAGNOSIS — T8249XA Other complication of vascular dialysis catheter, initial encounter: Secondary | ICD-10-CM | POA: Diagnosis not present

## 2016-05-15 DIAGNOSIS — Z79899 Other long term (current) drug therapy: Secondary | ICD-10-CM | POA: Diagnosis not present

## 2016-05-15 DIAGNOSIS — E1122 Type 2 diabetes mellitus with diabetic chronic kidney disease: Secondary | ICD-10-CM | POA: Diagnosis not present

## 2016-05-15 DIAGNOSIS — N186 End stage renal disease: Secondary | ICD-10-CM | POA: Insufficient documentation

## 2016-05-15 DIAGNOSIS — Z7982 Long term (current) use of aspirin: Secondary | ICD-10-CM | POA: Diagnosis not present

## 2016-05-15 DIAGNOSIS — I120 Hypertensive chronic kidney disease with stage 5 chronic kidney disease or end stage renal disease: Secondary | ICD-10-CM | POA: Diagnosis not present

## 2016-05-15 DIAGNOSIS — Y828 Other medical devices associated with adverse incidents: Secondary | ICD-10-CM | POA: Diagnosis not present

## 2016-05-15 DIAGNOSIS — R918 Other nonspecific abnormal finding of lung field: Secondary | ICD-10-CM | POA: Diagnosis not present

## 2016-05-15 DIAGNOSIS — T829XXA Unspecified complication of cardiac and vascular prosthetic device, implant and graft, initial encounter: Secondary | ICD-10-CM

## 2016-05-15 NOTE — Discharge Instructions (Signed)
You are scheduled for placement of a new catheter tomorrow at [facility]  Do not eat or drink anything after midnight  Please call the office if you are unable to make your appointment

## 2016-05-15 NOTE — ED Triage Notes (Signed)
Pt reports that his HD catheter came out this morning. Pt states that he had dialysis yesterday. Pt states that he noticed it was alittle loose yesterday. Pt states that placed a bandage over it and has had no bleeding at this time.

## 2016-05-15 NOTE — ED Notes (Signed)
New sterile dry dsg applied to perm cath incision.  No active bleeding noted

## 2016-05-15 NOTE — ED Provider Notes (Signed)
[facility] DEPT Provider Note   CSN: [id number] Arrival date & time: 05/15/16  0947     History   Chief Complaint Chief Complaint  Patient presents with  . Vascular Access Problem    HPI Shawn [name] is a 55 y.o. male.  HPI  Pt presents after his right subclavian perm cath came out today. Reports no CP and no bleeding at this time. Pt called the kidney center who recommended he come to the ER for evaluation. No other complaints at this time.   Past Medical History:  Diagnosis Date  . Anemia   . Arthritis    patient denies  . Blind right eye   . Chronic kidney disease    Dialysis since 2011 MWF  . Complication of anesthesia   . Diabetes mellitus    type II  . Hypertension   . Legally blind   . No pertinent past medical history   . PONV (postoperative nausea and vomiting)    N/V- became dehydrated had to have IV fluids- 01/2015  . Shortness of breath dyspnea    when has too fluid  . Stroke [facility] 2012   date per patient  . Syncope   . Unspecified cerebral artery occlusion with cerebral infarction 04/02/2013    Patient Active Problem List   Diagnosis Date Noted  . Syncope 01/23/2016  . Hypertension 01/23/2016  . Orthostatic hypotension 01/23/2016  . Legally blind   . Diabetes ([place])   . Diabetes mellitus with complication ([place])   . Intracranial atherosclerosis 08/10/2014  . End stage renal disease ([place]) 06/12/2013  . ESRD on dialysis ([place]) 04/17/2013  . Cerebral artery occlusion with cerebral infarction ([place]) 04/02/2013  . Dizziness and giddiness 04/02/2013    Past Surgical History:  Procedure Laterality Date  . AV FISTULA PLACEMENT Right 05/12/2013   Procedure: INSERTION OF ARTERIOVENOUS (AV) GORE-TEX GRAFT ARM; ULTRASOUND GUIDED;  Surgeon: Conrad Tennyson, MD;  Location: [facility];  Service: Vascular;  Laterality: Right;  . AV FISTULA PLACEMENT Left 01/18/2015   Procedure: INSERTION OF ARTERIOVENOUS GORE-TEX GRAFT LEFT UPPER ARM;  Surgeon: Conrad [place], MD;  Location: [facility];  Service: Vascular;  Laterality: Left;  . COLONOSCOPY  06/2012  . EYE SURGERY Bilateral    retina surgery both eyes, cataract surgery both eyes  . HERNIA REPAIR     right inguinal  . INSERTION OF DIALYSIS CATHETER Right   . left arm graft  10/2010  . LIGATION ARTERIOVENOUS GORTEX GRAFT Left 05/03/2015   Procedure: LIGATION ARTERIOVENOUS GORTEX GRAFT-LEFT UPPER ARM;  Surgeon: Conrad [place], MD;  Location: [facility];  Service: Vascular;  Laterality: Left;       Home Medications    Prior to Admission medications   Medication Sig Start Date End Date Taking? Authorizing Provider  aspirin 325 MG tablet Take 325 mg by mouth daily.   Yes Historical Provider, MD  atorvastatin (LIPITOR) 20 MG tablet Take 20 mg by mouth daily.   Yes Historical Provider, MD  B Complex Vitamins (B COMPLEX-B12) TABS Take 1 tablet by mouth daily.   Yes Historical Provider, MD  cinacalcet (SENSIPAR) 30 MG tablet Take 30 mg by mouth daily with supper.   Yes Historical Provider, MD  colesevelam (WELCHOL) 625 MG tablet Take 1,250 mg by mouth daily with supper.   Yes Historical Provider, MD  dorzolamide-timolol (COSOPT) 22.3-6.8 MG/ML ophthalmic solution Place 1 drop into the left eye 2 (two) times daily.   Yes Historical Provider, MD  insulin NPH-insulin  regular (NOVOLIN 70/30) (70-30) 100 UNIT/ML injection Inject 15 Units into the skin 2 (two) times daily with a meal.   Yes Historical Provider, MD  lanthanum (FOSRENOL) 1000 MG chewable tablet Chew 1,000 mg by mouth 3 (three) times daily with meals.   Yes Historical Provider, MD  latanoprost (XALATAN) 0.005 % ophthalmic solution Place 1 drop into the left eye at bedtime.   Yes Historical Provider, MD  midodrine (PROAMATINE) 10 MG tablet Take 10 mg by mouth as needed (takes before dialysis).   Yes Historical Provider, MD  acetaminophen (TYLENOL) 325 MG tablet Take 650 mg by mouth every 6 (six) hours as needed (pain).    Historical Provider, MD     Family History Family History  Problem Relation Age of Onset  . Diabetes Mother   . Alzheimer's disease Mother   . Cancer Father   . Heart disease Father   . Cancer Sister   . Stroke Sister     Social History Social History  Substance Use Topics  . Smoking status: Never Smoker  . Smokeless tobacco: Never Used  . Alcohol use No     Allergies   Patient has no known allergies.   Review of Systems Review of Systems  All other systems reviewed and are negative.    Physical Exam Updated Vital Signs BP 96/64 (BP Location: Left Arm)   Pulse 102   Temp 98 F (36.7 C) (Oral)   Resp 18   SpO2 99%   Physical Exam  Constitutional: He is oriented to person, place, and time.  HENT:  Head: Normocephalic and atraumatic.  Neck: Normal range of motion.  Cardiovascular: Normal rate, regular rhythm and intact distal pulses.   Pulmonary/Chest: Effort normal and breath sounds normal. No respiratory distress.  Musculoskeletal: Normal range of motion.  Neurological: He is alert and oriented to person, place, and time.  Skin: Skin is warm and dry.  Nursing note and vitals reviewed.    ED Treatments / Results  Labs (all labs ordered are listed, but only abnormal results are displayed) Labs Reviewed - No data to display  EKG  EKG Interpretation None       Radiology Dg Chest 2 View  Result Date: 05/15/2016 CLINICAL DATA:  Right subclavian PermCath can out today. Dialysis patient. EXAM: CHEST  2 VIEW COMPARISON:  01/23/2016, 05/12/2013, 10/26/2010 FINDINGS: Right jugular catheter no longer present.  No pneumothorax. Negative for heart failure.  No edema or effusion Nodular density left upper lobe unchanged from 2014. Soft tissue mass in the right azygos vein region. This is slightly more prominent than on prior studies and could represent adenopathy versus distended vein. IMPRESSION: PermCath no longer present. Negative for fluid overload Vague left upper lobe density  stable Right peritracheal soft tissue mass may represent adenopathy or distended azygos vein. Electronically Signed   By: Franchot Gallo M.D.   On: 05/15/2016 13:12    Procedures Procedures (including critical care time)  Medications Ordered in ED Medications - No data to display   Initial Impression / Assessment and Plan / ED Course  I have reviewed the triage vital signs and the nursing notes.  Pertinent labs & imaging results that were available during my care of the patient were reviewed by me and considered in my medical decision making (see chart for details).  Clinical Course     I spoke with the [facility] vascular access center who will be able to place a new dialysis catheter tomorrow at 10 AM.  The patient will need to remain nothing by mouth after midnight.  This is been described to the patient.  The patient will have transportation to the access Center tomorrow.  I spoke with the team at [facility] to have an order sent to the vascular access Center for placement.  No signs of bleeding at this time.  Chest x-ray without significant abnormality.  Hemodynamically stable.  Patient be discharged home at this time.  He understands to return to the ER for new or worsening symptoms.  He had a full run of dialysis yesterday.  Final Clinical Impressions(s) / ED Diagnoses   Final diagnoses:  None    New Prescriptions New Prescriptions   No medications on file     Jola Schmidt, MD 05/15/16 1331

## 2016-05-15 NOTE — ED Notes (Signed)
Patient transported to X-ray 

## 2016-05-15 NOTE — ED Notes (Signed)
Pt reports noticing his R subclavian perm cath came out this am.  Pt states it felt like "pins and needles" after it came out which resolved.  Pt denies any pain at this time.  Site appears clean and no active bleeding noted.

## 2016-05-16 DIAGNOSIS — N186 End stage renal disease: Secondary | ICD-10-CM | POA: Diagnosis not present

## 2016-05-16 DIAGNOSIS — T8242XA Displacement of vascular dialysis catheter, initial encounter: Secondary | ICD-10-CM | POA: Diagnosis not present

## 2016-05-16 DIAGNOSIS — Z992 Dependence on renal dialysis: Secondary | ICD-10-CM | POA: Diagnosis not present

## 2016-05-18 DIAGNOSIS — Z23 Encounter for immunization: Secondary | ICD-10-CM | POA: Diagnosis not present

## 2016-05-18 DIAGNOSIS — D509 Iron deficiency anemia, unspecified: Secondary | ICD-10-CM | POA: Diagnosis not present

## 2016-05-18 DIAGNOSIS — D631 Anemia in chronic kidney disease: Secondary | ICD-10-CM | POA: Diagnosis not present

## 2016-05-18 DIAGNOSIS — N186 End stage renal disease: Secondary | ICD-10-CM | POA: Diagnosis not present

## 2016-05-18 DIAGNOSIS — N2581 Secondary hyperparathyroidism of renal origin: Secondary | ICD-10-CM | POA: Diagnosis not present

## 2016-05-18 DIAGNOSIS — E119 Type 2 diabetes mellitus without complications: Secondary | ICD-10-CM | POA: Diagnosis not present

## 2016-05-21 DIAGNOSIS — E119 Type 2 diabetes mellitus without complications: Secondary | ICD-10-CM | POA: Diagnosis not present

## 2016-05-21 DIAGNOSIS — N186 End stage renal disease: Secondary | ICD-10-CM | POA: Diagnosis not present

## 2016-05-21 DIAGNOSIS — D631 Anemia in chronic kidney disease: Secondary | ICD-10-CM | POA: Diagnosis not present

## 2016-05-21 DIAGNOSIS — D509 Iron deficiency anemia, unspecified: Secondary | ICD-10-CM | POA: Diagnosis not present

## 2016-05-21 DIAGNOSIS — Z23 Encounter for immunization: Secondary | ICD-10-CM | POA: Diagnosis not present

## 2016-05-21 DIAGNOSIS — N2581 Secondary hyperparathyroidism of renal origin: Secondary | ICD-10-CM | POA: Diagnosis not present

## 2016-05-22 DIAGNOSIS — I6990 Unspecified sequelae of unspecified cerebrovascular disease: Secondary | ICD-10-CM | POA: Diagnosis not present

## 2016-05-22 DIAGNOSIS — E669 Obesity, unspecified: Secondary | ICD-10-CM | POA: Diagnosis not present

## 2016-05-22 DIAGNOSIS — E559 Vitamin D deficiency, unspecified: Secondary | ICD-10-CM | POA: Diagnosis not present

## 2016-05-22 DIAGNOSIS — E113591 Type 2 diabetes mellitus with proliferative diabetic retinopathy without macular edema, right eye: Secondary | ICD-10-CM | POA: Diagnosis not present

## 2016-05-22 DIAGNOSIS — Z79899 Other long term (current) drug therapy: Secondary | ICD-10-CM | POA: Diagnosis not present

## 2016-05-22 DIAGNOSIS — H548 Legal blindness, as defined in USA: Secondary | ICD-10-CM | POA: Diagnosis not present

## 2016-05-22 DIAGNOSIS — H3520 Other non-diabetic proliferative retinopathy, unspecified eye: Secondary | ICD-10-CM | POA: Diagnosis not present

## 2016-05-22 DIAGNOSIS — I2510 Atherosclerotic heart disease of native coronary artery without angina pectoris: Secondary | ICD-10-CM | POA: Diagnosis not present

## 2016-05-22 DIAGNOSIS — E7800 Pure hypercholesterolemia, unspecified: Secondary | ICD-10-CM | POA: Diagnosis not present

## 2016-05-22 DIAGNOSIS — E1165 Type 2 diabetes mellitus with hyperglycemia: Secondary | ICD-10-CM | POA: Diagnosis not present

## 2016-05-22 DIAGNOSIS — Z125 Encounter for screening for malignant neoplasm of prostate: Secondary | ICD-10-CM | POA: Diagnosis not present

## 2016-05-22 DIAGNOSIS — I119 Hypertensive heart disease without heart failure: Secondary | ICD-10-CM | POA: Diagnosis not present

## 2016-05-23 DIAGNOSIS — D509 Iron deficiency anemia, unspecified: Secondary | ICD-10-CM | POA: Diagnosis not present

## 2016-05-23 DIAGNOSIS — N186 End stage renal disease: Secondary | ICD-10-CM | POA: Diagnosis not present

## 2016-05-23 DIAGNOSIS — D631 Anemia in chronic kidney disease: Secondary | ICD-10-CM | POA: Diagnosis not present

## 2016-05-23 DIAGNOSIS — E119 Type 2 diabetes mellitus without complications: Secondary | ICD-10-CM | POA: Diagnosis not present

## 2016-05-23 DIAGNOSIS — Z23 Encounter for immunization: Secondary | ICD-10-CM | POA: Diagnosis not present

## 2016-05-23 DIAGNOSIS — N2581 Secondary hyperparathyroidism of renal origin: Secondary | ICD-10-CM | POA: Diagnosis not present

## 2016-05-25 DIAGNOSIS — N2581 Secondary hyperparathyroidism of renal origin: Secondary | ICD-10-CM | POA: Diagnosis not present

## 2016-05-25 DIAGNOSIS — Z23 Encounter for immunization: Secondary | ICD-10-CM | POA: Diagnosis not present

## 2016-05-25 DIAGNOSIS — E119 Type 2 diabetes mellitus without complications: Secondary | ICD-10-CM | POA: Diagnosis not present

## 2016-05-25 DIAGNOSIS — N186 End stage renal disease: Secondary | ICD-10-CM | POA: Diagnosis not present

## 2016-05-25 DIAGNOSIS — D509 Iron deficiency anemia, unspecified: Secondary | ICD-10-CM | POA: Diagnosis not present

## 2016-05-25 DIAGNOSIS — D631 Anemia in chronic kidney disease: Secondary | ICD-10-CM | POA: Diagnosis not present

## 2016-05-27 DIAGNOSIS — E119 Type 2 diabetes mellitus without complications: Secondary | ICD-10-CM | POA: Diagnosis not present

## 2016-05-27 DIAGNOSIS — Z23 Encounter for immunization: Secondary | ICD-10-CM | POA: Diagnosis not present

## 2016-05-27 DIAGNOSIS — D631 Anemia in chronic kidney disease: Secondary | ICD-10-CM | POA: Diagnosis not present

## 2016-05-27 DIAGNOSIS — D509 Iron deficiency anemia, unspecified: Secondary | ICD-10-CM | POA: Diagnosis not present

## 2016-05-27 DIAGNOSIS — N186 End stage renal disease: Secondary | ICD-10-CM | POA: Diagnosis not present

## 2016-05-27 DIAGNOSIS — N2581 Secondary hyperparathyroidism of renal origin: Secondary | ICD-10-CM | POA: Diagnosis not present

## 2016-05-29 DIAGNOSIS — N2581 Secondary hyperparathyroidism of renal origin: Secondary | ICD-10-CM | POA: Diagnosis not present

## 2016-05-29 DIAGNOSIS — N186 End stage renal disease: Secondary | ICD-10-CM | POA: Diagnosis not present

## 2016-05-29 DIAGNOSIS — D509 Iron deficiency anemia, unspecified: Secondary | ICD-10-CM | POA: Diagnosis not present

## 2016-05-29 DIAGNOSIS — Z23 Encounter for immunization: Secondary | ICD-10-CM | POA: Diagnosis not present

## 2016-05-29 DIAGNOSIS — D631 Anemia in chronic kidney disease: Secondary | ICD-10-CM | POA: Diagnosis not present

## 2016-05-29 DIAGNOSIS — E119 Type 2 diabetes mellitus without complications: Secondary | ICD-10-CM | POA: Diagnosis not present

## 2016-06-01 DIAGNOSIS — D509 Iron deficiency anemia, unspecified: Secondary | ICD-10-CM | POA: Diagnosis not present

## 2016-06-01 DIAGNOSIS — E119 Type 2 diabetes mellitus without complications: Secondary | ICD-10-CM | POA: Diagnosis not present

## 2016-06-01 DIAGNOSIS — N2581 Secondary hyperparathyroidism of renal origin: Secondary | ICD-10-CM | POA: Diagnosis not present

## 2016-06-01 DIAGNOSIS — D631 Anemia in chronic kidney disease: Secondary | ICD-10-CM | POA: Diagnosis not present

## 2016-06-01 DIAGNOSIS — N186 End stage renal disease: Secondary | ICD-10-CM | POA: Diagnosis not present

## 2016-06-01 DIAGNOSIS — Z23 Encounter for immunization: Secondary | ICD-10-CM | POA: Diagnosis not present

## 2016-06-04 DIAGNOSIS — Z23 Encounter for immunization: Secondary | ICD-10-CM | POA: Diagnosis not present

## 2016-06-04 DIAGNOSIS — N2581 Secondary hyperparathyroidism of renal origin: Secondary | ICD-10-CM | POA: Diagnosis not present

## 2016-06-04 DIAGNOSIS — E119 Type 2 diabetes mellitus without complications: Secondary | ICD-10-CM | POA: Diagnosis not present

## 2016-06-04 DIAGNOSIS — D509 Iron deficiency anemia, unspecified: Secondary | ICD-10-CM | POA: Diagnosis not present

## 2016-06-04 DIAGNOSIS — D631 Anemia in chronic kidney disease: Secondary | ICD-10-CM | POA: Diagnosis not present

## 2016-06-04 DIAGNOSIS — N186 End stage renal disease: Secondary | ICD-10-CM | POA: Diagnosis not present

## 2016-06-06 DIAGNOSIS — E119 Type 2 diabetes mellitus without complications: Secondary | ICD-10-CM | POA: Diagnosis not present

## 2016-06-06 DIAGNOSIS — N186 End stage renal disease: Secondary | ICD-10-CM | POA: Diagnosis not present

## 2016-06-06 DIAGNOSIS — D631 Anemia in chronic kidney disease: Secondary | ICD-10-CM | POA: Diagnosis not present

## 2016-06-06 DIAGNOSIS — N2581 Secondary hyperparathyroidism of renal origin: Secondary | ICD-10-CM | POA: Diagnosis not present

## 2016-06-06 DIAGNOSIS — Z23 Encounter for immunization: Secondary | ICD-10-CM | POA: Diagnosis not present

## 2016-06-06 DIAGNOSIS — D509 Iron deficiency anemia, unspecified: Secondary | ICD-10-CM | POA: Diagnosis not present

## 2016-06-07 DIAGNOSIS — I120 Hypertensive chronic kidney disease with stage 5 chronic kidney disease or end stage renal disease: Secondary | ICD-10-CM | POA: Diagnosis not present

## 2016-06-07 DIAGNOSIS — N186 End stage renal disease: Secondary | ICD-10-CM | POA: Diagnosis not present

## 2016-06-07 DIAGNOSIS — Z992 Dependence on renal dialysis: Secondary | ICD-10-CM | POA: Diagnosis not present

## 2016-06-08 DIAGNOSIS — D631 Anemia in chronic kidney disease: Secondary | ICD-10-CM | POA: Diagnosis not present

## 2016-06-08 DIAGNOSIS — N2581 Secondary hyperparathyroidism of renal origin: Secondary | ICD-10-CM | POA: Diagnosis not present

## 2016-06-08 DIAGNOSIS — T8249XA Other complication of vascular dialysis catheter, initial encounter: Secondary | ICD-10-CM | POA: Diagnosis not present

## 2016-06-08 DIAGNOSIS — N186 End stage renal disease: Secondary | ICD-10-CM | POA: Diagnosis not present

## 2016-06-08 DIAGNOSIS — D509 Iron deficiency anemia, unspecified: Secondary | ICD-10-CM | POA: Diagnosis not present

## 2016-06-11 DIAGNOSIS — T8249XA Other complication of vascular dialysis catheter, initial encounter: Secondary | ICD-10-CM | POA: Diagnosis not present

## 2016-06-11 DIAGNOSIS — N186 End stage renal disease: Secondary | ICD-10-CM | POA: Diagnosis not present

## 2016-06-11 DIAGNOSIS — D631 Anemia in chronic kidney disease: Secondary | ICD-10-CM | POA: Diagnosis not present

## 2016-06-11 DIAGNOSIS — D509 Iron deficiency anemia, unspecified: Secondary | ICD-10-CM | POA: Diagnosis not present

## 2016-06-11 DIAGNOSIS — N2581 Secondary hyperparathyroidism of renal origin: Secondary | ICD-10-CM | POA: Diagnosis not present

## 2016-06-13 DIAGNOSIS — D631 Anemia in chronic kidney disease: Secondary | ICD-10-CM | POA: Diagnosis not present

## 2016-06-13 DIAGNOSIS — N2581 Secondary hyperparathyroidism of renal origin: Secondary | ICD-10-CM | POA: Diagnosis not present

## 2016-06-13 DIAGNOSIS — D509 Iron deficiency anemia, unspecified: Secondary | ICD-10-CM | POA: Diagnosis not present

## 2016-06-13 DIAGNOSIS — T8249XA Other complication of vascular dialysis catheter, initial encounter: Secondary | ICD-10-CM | POA: Diagnosis not present

## 2016-06-13 DIAGNOSIS — N186 End stage renal disease: Secondary | ICD-10-CM | POA: Diagnosis not present

## 2016-06-15 DIAGNOSIS — N186 End stage renal disease: Secondary | ICD-10-CM | POA: Diagnosis not present

## 2016-06-15 DIAGNOSIS — N2581 Secondary hyperparathyroidism of renal origin: Secondary | ICD-10-CM | POA: Diagnosis not present

## 2016-06-15 DIAGNOSIS — D631 Anemia in chronic kidney disease: Secondary | ICD-10-CM | POA: Diagnosis not present

## 2016-06-15 DIAGNOSIS — T8249XA Other complication of vascular dialysis catheter, initial encounter: Secondary | ICD-10-CM | POA: Diagnosis not present

## 2016-06-15 DIAGNOSIS — D509 Iron deficiency anemia, unspecified: Secondary | ICD-10-CM | POA: Diagnosis not present

## 2016-06-18 DIAGNOSIS — T8249XA Other complication of vascular dialysis catheter, initial encounter: Secondary | ICD-10-CM | POA: Diagnosis not present

## 2016-06-18 DIAGNOSIS — N2581 Secondary hyperparathyroidism of renal origin: Secondary | ICD-10-CM | POA: Diagnosis not present

## 2016-06-18 DIAGNOSIS — N186 End stage renal disease: Secondary | ICD-10-CM | POA: Diagnosis not present

## 2016-06-18 DIAGNOSIS — D509 Iron deficiency anemia, unspecified: Secondary | ICD-10-CM | POA: Diagnosis not present

## 2016-06-18 DIAGNOSIS — D631 Anemia in chronic kidney disease: Secondary | ICD-10-CM | POA: Diagnosis not present

## 2016-06-20 DIAGNOSIS — D631 Anemia in chronic kidney disease: Secondary | ICD-10-CM | POA: Diagnosis not present

## 2016-06-20 DIAGNOSIS — N2581 Secondary hyperparathyroidism of renal origin: Secondary | ICD-10-CM | POA: Diagnosis not present

## 2016-06-20 DIAGNOSIS — D509 Iron deficiency anemia, unspecified: Secondary | ICD-10-CM | POA: Diagnosis not present

## 2016-06-20 DIAGNOSIS — T8249XA Other complication of vascular dialysis catheter, initial encounter: Secondary | ICD-10-CM | POA: Diagnosis not present

## 2016-06-20 DIAGNOSIS — N186 End stage renal disease: Secondary | ICD-10-CM | POA: Diagnosis not present

## 2016-06-22 DIAGNOSIS — N2581 Secondary hyperparathyroidism of renal origin: Secondary | ICD-10-CM | POA: Diagnosis not present

## 2016-06-22 DIAGNOSIS — D631 Anemia in chronic kidney disease: Secondary | ICD-10-CM | POA: Diagnosis not present

## 2016-06-22 DIAGNOSIS — D509 Iron deficiency anemia, unspecified: Secondary | ICD-10-CM | POA: Diagnosis not present

## 2016-06-22 DIAGNOSIS — T8249XA Other complication of vascular dialysis catheter, initial encounter: Secondary | ICD-10-CM | POA: Diagnosis not present

## 2016-06-22 DIAGNOSIS — N186 End stage renal disease: Secondary | ICD-10-CM | POA: Diagnosis not present

## 2016-06-25 DIAGNOSIS — N186 End stage renal disease: Secondary | ICD-10-CM | POA: Diagnosis not present

## 2016-06-25 DIAGNOSIS — D631 Anemia in chronic kidney disease: Secondary | ICD-10-CM | POA: Diagnosis not present

## 2016-06-25 DIAGNOSIS — D509 Iron deficiency anemia, unspecified: Secondary | ICD-10-CM | POA: Diagnosis not present

## 2016-06-25 DIAGNOSIS — N2581 Secondary hyperparathyroidism of renal origin: Secondary | ICD-10-CM | POA: Diagnosis not present

## 2016-06-25 DIAGNOSIS — T8249XA Other complication of vascular dialysis catheter, initial encounter: Secondary | ICD-10-CM | POA: Diagnosis not present

## 2016-06-27 DIAGNOSIS — N2581 Secondary hyperparathyroidism of renal origin: Secondary | ICD-10-CM | POA: Diagnosis not present

## 2016-06-27 DIAGNOSIS — T8249XA Other complication of vascular dialysis catheter, initial encounter: Secondary | ICD-10-CM | POA: Diagnosis not present

## 2016-06-27 DIAGNOSIS — D631 Anemia in chronic kidney disease: Secondary | ICD-10-CM | POA: Diagnosis not present

## 2016-06-27 DIAGNOSIS — D509 Iron deficiency anemia, unspecified: Secondary | ICD-10-CM | POA: Diagnosis not present

## 2016-06-27 DIAGNOSIS — N186 End stage renal disease: Secondary | ICD-10-CM | POA: Diagnosis not present

## 2016-06-29 DIAGNOSIS — N186 End stage renal disease: Secondary | ICD-10-CM | POA: Diagnosis not present

## 2016-06-29 DIAGNOSIS — D631 Anemia in chronic kidney disease: Secondary | ICD-10-CM | POA: Diagnosis not present

## 2016-06-29 DIAGNOSIS — D509 Iron deficiency anemia, unspecified: Secondary | ICD-10-CM | POA: Diagnosis not present

## 2016-06-29 DIAGNOSIS — N2581 Secondary hyperparathyroidism of renal origin: Secondary | ICD-10-CM | POA: Diagnosis not present

## 2016-06-29 DIAGNOSIS — T8249XA Other complication of vascular dialysis catheter, initial encounter: Secondary | ICD-10-CM | POA: Diagnosis not present

## 2016-07-01 DIAGNOSIS — N186 End stage renal disease: Secondary | ICD-10-CM | POA: Diagnosis not present

## 2016-07-01 DIAGNOSIS — D631 Anemia in chronic kidney disease: Secondary | ICD-10-CM | POA: Diagnosis not present

## 2016-07-01 DIAGNOSIS — N2581 Secondary hyperparathyroidism of renal origin: Secondary | ICD-10-CM | POA: Diagnosis not present

## 2016-07-01 DIAGNOSIS — T8249XA Other complication of vascular dialysis catheter, initial encounter: Secondary | ICD-10-CM | POA: Diagnosis not present

## 2016-07-01 DIAGNOSIS — D509 Iron deficiency anemia, unspecified: Secondary | ICD-10-CM | POA: Diagnosis not present

## 2016-07-04 DIAGNOSIS — D509 Iron deficiency anemia, unspecified: Secondary | ICD-10-CM | POA: Diagnosis not present

## 2016-07-04 DIAGNOSIS — T8249XA Other complication of vascular dialysis catheter, initial encounter: Secondary | ICD-10-CM | POA: Diagnosis not present

## 2016-07-04 DIAGNOSIS — D631 Anemia in chronic kidney disease: Secondary | ICD-10-CM | POA: Diagnosis not present

## 2016-07-04 DIAGNOSIS — N2581 Secondary hyperparathyroidism of renal origin: Secondary | ICD-10-CM | POA: Diagnosis not present

## 2016-07-04 DIAGNOSIS — N186 End stage renal disease: Secondary | ICD-10-CM | POA: Diagnosis not present

## 2016-07-05 ENCOUNTER — Ambulatory Visit (INDEPENDENT_AMBULATORY_CARE_PROVIDER_SITE_OTHER): Payer: Medicare Other | Admitting: Podiatry

## 2016-07-05 ENCOUNTER — Encounter: Payer: Self-pay | Admitting: Podiatry

## 2016-07-05 VITALS — Ht 71.0 in | Wt 232.0 lb

## 2016-07-05 DIAGNOSIS — M2040 Other hammer toe(s) (acquired), unspecified foot: Secondary | ICD-10-CM

## 2016-07-05 DIAGNOSIS — B351 Tinea unguium: Secondary | ICD-10-CM

## 2016-07-05 DIAGNOSIS — E1140 Type 2 diabetes mellitus with diabetic neuropathy, unspecified: Secondary | ICD-10-CM

## 2016-07-05 DIAGNOSIS — M79676 Pain in unspecified toe(s): Secondary | ICD-10-CM | POA: Diagnosis not present

## 2016-07-05 DIAGNOSIS — M2010 Hallux valgus (acquired), unspecified foot: Secondary | ICD-10-CM

## 2016-07-05 NOTE — Progress Notes (Signed)
Patient ID: Shawn [name], male   DOB: 07/01/1961, 55 y.o.   MRN: [id number] Complaint:  Visit Type: Patient returns to my office for continued preventative foot care services. Complaint: Patient states" my nails have grown long and thick and become painful to walk and wear shoes" Patient has been diagnosed with DM with neuropathy and angiopathy.. The patient presents for preventative foot care services. No changes to ROS  Podiatric Exam: Vascular: dorsalis pedis and posterior tibial pulses are not  palpable bilateral. Capillary return is immediate. Cold feet  B/L Skin turgor WNL  Sensorium: Diminished Semmes Weinstein monofilament test. Normal tactile sensation bilaterally. Nail Exam: Pt has thick disfigured discolored nails with subungual debris noted bilateral entire nail hallux through fifth toenails Ulcer Exam: There is no evidence of ulcer or pre-ulcerative changes or infection. Orthopedic Exam: Muscle tone and strength are WNL. No limitations in general ROM. No crepitus or effusions noted. Foot type and digits show no abnormalities. HAV B/L  Hammer toe 2  B/L Skin: No Porokeratosis. No infection or ulcers.    No signs of redness or infection or swelling.  Diagnosis:  Onychomycosis, , Pain in right toe, pain in left toes  Treatment & Plan Procedures and Treatment: Consent by patient was obtained for treatment procedures. The patient understood the discussion of treatment and procedures well. All questions were answered thoroughly reviewed. Debridement of mycotic and hypertrophic toenails, 1 through 5 bilateral and clearing of subungual debris. No ulceration, no infection noted. Initiate diabetic shoe paperwork with HAV and HT. Return Visit-Office Procedure: Patient instructed to return to the office for a follow up visit 3 months for continued evaluation and treatment.  Gardiner Barefoot DPM

## 2016-07-06 DIAGNOSIS — D631 Anemia in chronic kidney disease: Secondary | ICD-10-CM | POA: Diagnosis not present

## 2016-07-06 DIAGNOSIS — D509 Iron deficiency anemia, unspecified: Secondary | ICD-10-CM | POA: Diagnosis not present

## 2016-07-06 DIAGNOSIS — N2581 Secondary hyperparathyroidism of renal origin: Secondary | ICD-10-CM | POA: Diagnosis not present

## 2016-07-06 DIAGNOSIS — T8249XA Other complication of vascular dialysis catheter, initial encounter: Secondary | ICD-10-CM | POA: Diagnosis not present

## 2016-07-06 DIAGNOSIS — N186 End stage renal disease: Secondary | ICD-10-CM | POA: Diagnosis not present

## 2016-07-08 DIAGNOSIS — I120 Hypertensive chronic kidney disease with stage 5 chronic kidney disease or end stage renal disease: Secondary | ICD-10-CM | POA: Diagnosis not present

## 2016-07-08 DIAGNOSIS — D509 Iron deficiency anemia, unspecified: Secondary | ICD-10-CM | POA: Diagnosis not present

## 2016-07-08 DIAGNOSIS — N186 End stage renal disease: Secondary | ICD-10-CM | POA: Diagnosis not present

## 2016-07-08 DIAGNOSIS — D631 Anemia in chronic kidney disease: Secondary | ICD-10-CM | POA: Diagnosis not present

## 2016-07-08 DIAGNOSIS — T8249XA Other complication of vascular dialysis catheter, initial encounter: Secondary | ICD-10-CM | POA: Diagnosis not present

## 2016-07-08 DIAGNOSIS — Z992 Dependence on renal dialysis: Secondary | ICD-10-CM | POA: Diagnosis not present

## 2016-07-08 DIAGNOSIS — N2581 Secondary hyperparathyroidism of renal origin: Secondary | ICD-10-CM | POA: Diagnosis not present

## 2016-07-10 DIAGNOSIS — D631 Anemia in chronic kidney disease: Secondary | ICD-10-CM | POA: Diagnosis not present

## 2016-07-10 DIAGNOSIS — D509 Iron deficiency anemia, unspecified: Secondary | ICD-10-CM | POA: Diagnosis not present

## 2016-07-10 DIAGNOSIS — N2581 Secondary hyperparathyroidism of renal origin: Secondary | ICD-10-CM | POA: Diagnosis not present

## 2016-07-10 DIAGNOSIS — N186 End stage renal disease: Secondary | ICD-10-CM | POA: Diagnosis not present

## 2016-07-13 DIAGNOSIS — N186 End stage renal disease: Secondary | ICD-10-CM | POA: Diagnosis not present

## 2016-07-13 DIAGNOSIS — N2581 Secondary hyperparathyroidism of renal origin: Secondary | ICD-10-CM | POA: Diagnosis not present

## 2016-07-13 DIAGNOSIS — D509 Iron deficiency anemia, unspecified: Secondary | ICD-10-CM | POA: Diagnosis not present

## 2016-07-13 DIAGNOSIS — D631 Anemia in chronic kidney disease: Secondary | ICD-10-CM | POA: Diagnosis not present

## 2016-07-16 DIAGNOSIS — D631 Anemia in chronic kidney disease: Secondary | ICD-10-CM | POA: Diagnosis not present

## 2016-07-16 DIAGNOSIS — D509 Iron deficiency anemia, unspecified: Secondary | ICD-10-CM | POA: Diagnosis not present

## 2016-07-16 DIAGNOSIS — N186 End stage renal disease: Secondary | ICD-10-CM | POA: Diagnosis not present

## 2016-07-16 DIAGNOSIS — N2581 Secondary hyperparathyroidism of renal origin: Secondary | ICD-10-CM | POA: Diagnosis not present

## 2016-07-18 DIAGNOSIS — D509 Iron deficiency anemia, unspecified: Secondary | ICD-10-CM | POA: Diagnosis not present

## 2016-07-18 DIAGNOSIS — D631 Anemia in chronic kidney disease: Secondary | ICD-10-CM | POA: Diagnosis not present

## 2016-07-18 DIAGNOSIS — N2581 Secondary hyperparathyroidism of renal origin: Secondary | ICD-10-CM | POA: Diagnosis not present

## 2016-07-18 DIAGNOSIS — N186 End stage renal disease: Secondary | ICD-10-CM | POA: Diagnosis not present

## 2016-07-20 DIAGNOSIS — N186 End stage renal disease: Secondary | ICD-10-CM | POA: Diagnosis not present

## 2016-07-20 DIAGNOSIS — N2581 Secondary hyperparathyroidism of renal origin: Secondary | ICD-10-CM | POA: Diagnosis not present

## 2016-07-20 DIAGNOSIS — D631 Anemia in chronic kidney disease: Secondary | ICD-10-CM | POA: Diagnosis not present

## 2016-07-20 DIAGNOSIS — D509 Iron deficiency anemia, unspecified: Secondary | ICD-10-CM | POA: Diagnosis not present

## 2016-07-23 DIAGNOSIS — D509 Iron deficiency anemia, unspecified: Secondary | ICD-10-CM | POA: Diagnosis not present

## 2016-07-23 DIAGNOSIS — N2581 Secondary hyperparathyroidism of renal origin: Secondary | ICD-10-CM | POA: Diagnosis not present

## 2016-07-23 DIAGNOSIS — D631 Anemia in chronic kidney disease: Secondary | ICD-10-CM | POA: Diagnosis not present

## 2016-07-23 DIAGNOSIS — N186 End stage renal disease: Secondary | ICD-10-CM | POA: Diagnosis not present

## 2016-07-24 DIAGNOSIS — T82898A Other specified complication of vascular prosthetic devices, implants and grafts, initial encounter: Secondary | ICD-10-CM | POA: Diagnosis not present

## 2016-07-24 DIAGNOSIS — N186 End stage renal disease: Secondary | ICD-10-CM | POA: Diagnosis not present

## 2016-07-24 DIAGNOSIS — Z992 Dependence on renal dialysis: Secondary | ICD-10-CM | POA: Diagnosis not present

## 2016-07-25 DIAGNOSIS — N186 End stage renal disease: Secondary | ICD-10-CM | POA: Diagnosis not present

## 2016-07-25 DIAGNOSIS — N2581 Secondary hyperparathyroidism of renal origin: Secondary | ICD-10-CM | POA: Diagnosis not present

## 2016-07-25 DIAGNOSIS — D631 Anemia in chronic kidney disease: Secondary | ICD-10-CM | POA: Diagnosis not present

## 2016-07-25 DIAGNOSIS — D509 Iron deficiency anemia, unspecified: Secondary | ICD-10-CM | POA: Diagnosis not present

## 2016-07-27 DIAGNOSIS — D631 Anemia in chronic kidney disease: Secondary | ICD-10-CM | POA: Diagnosis not present

## 2016-07-27 DIAGNOSIS — D509 Iron deficiency anemia, unspecified: Secondary | ICD-10-CM | POA: Diagnosis not present

## 2016-07-27 DIAGNOSIS — N186 End stage renal disease: Secondary | ICD-10-CM | POA: Diagnosis not present

## 2016-07-27 DIAGNOSIS — N2581 Secondary hyperparathyroidism of renal origin: Secondary | ICD-10-CM | POA: Diagnosis not present

## 2016-07-30 DIAGNOSIS — D509 Iron deficiency anemia, unspecified: Secondary | ICD-10-CM | POA: Diagnosis not present

## 2016-07-30 DIAGNOSIS — N2581 Secondary hyperparathyroidism of renal origin: Secondary | ICD-10-CM | POA: Diagnosis not present

## 2016-07-30 DIAGNOSIS — N186 End stage renal disease: Secondary | ICD-10-CM | POA: Diagnosis not present

## 2016-07-30 DIAGNOSIS — D631 Anemia in chronic kidney disease: Secondary | ICD-10-CM | POA: Diagnosis not present

## 2016-08-01 DIAGNOSIS — D631 Anemia in chronic kidney disease: Secondary | ICD-10-CM | POA: Diagnosis not present

## 2016-08-01 DIAGNOSIS — N2581 Secondary hyperparathyroidism of renal origin: Secondary | ICD-10-CM | POA: Diagnosis not present

## 2016-08-01 DIAGNOSIS — N186 End stage renal disease: Secondary | ICD-10-CM | POA: Diagnosis not present

## 2016-08-01 DIAGNOSIS — D509 Iron deficiency anemia, unspecified: Secondary | ICD-10-CM | POA: Diagnosis not present

## 2016-08-01 DIAGNOSIS — E1129 Type 2 diabetes mellitus with other diabetic kidney complication: Secondary | ICD-10-CM | POA: Diagnosis not present

## 2016-08-03 DIAGNOSIS — N186 End stage renal disease: Secondary | ICD-10-CM | POA: Diagnosis not present

## 2016-08-03 DIAGNOSIS — D631 Anemia in chronic kidney disease: Secondary | ICD-10-CM | POA: Diagnosis not present

## 2016-08-03 DIAGNOSIS — D509 Iron deficiency anemia, unspecified: Secondary | ICD-10-CM | POA: Diagnosis not present

## 2016-08-03 DIAGNOSIS — N2581 Secondary hyperparathyroidism of renal origin: Secondary | ICD-10-CM | POA: Diagnosis not present

## 2016-08-06 DIAGNOSIS — N186 End stage renal disease: Secondary | ICD-10-CM | POA: Diagnosis not present

## 2016-08-06 DIAGNOSIS — D509 Iron deficiency anemia, unspecified: Secondary | ICD-10-CM | POA: Diagnosis not present

## 2016-08-06 DIAGNOSIS — D631 Anemia in chronic kidney disease: Secondary | ICD-10-CM | POA: Diagnosis not present

## 2016-08-06 DIAGNOSIS — N2581 Secondary hyperparathyroidism of renal origin: Secondary | ICD-10-CM | POA: Diagnosis not present

## 2016-08-07 DIAGNOSIS — N186 End stage renal disease: Secondary | ICD-10-CM | POA: Diagnosis not present

## 2016-08-07 DIAGNOSIS — T8249XA Other complication of vascular dialysis catheter, initial encounter: Secondary | ICD-10-CM | POA: Diagnosis not present

## 2016-08-07 DIAGNOSIS — Z992 Dependence on renal dialysis: Secondary | ICD-10-CM | POA: Diagnosis not present

## 2016-08-08 DIAGNOSIS — Z992 Dependence on renal dialysis: Secondary | ICD-10-CM | POA: Diagnosis not present

## 2016-08-08 DIAGNOSIS — N186 End stage renal disease: Secondary | ICD-10-CM | POA: Diagnosis not present

## 2016-08-08 DIAGNOSIS — I120 Hypertensive chronic kidney disease with stage 5 chronic kidney disease or end stage renal disease: Secondary | ICD-10-CM | POA: Diagnosis not present

## 2016-08-08 DIAGNOSIS — D631 Anemia in chronic kidney disease: Secondary | ICD-10-CM | POA: Diagnosis not present

## 2016-08-08 DIAGNOSIS — D509 Iron deficiency anemia, unspecified: Secondary | ICD-10-CM | POA: Diagnosis not present

## 2016-08-08 DIAGNOSIS — N2581 Secondary hyperparathyroidism of renal origin: Secondary | ICD-10-CM | POA: Diagnosis not present

## 2016-08-10 DIAGNOSIS — D509 Iron deficiency anemia, unspecified: Secondary | ICD-10-CM | POA: Diagnosis not present

## 2016-08-10 DIAGNOSIS — N2581 Secondary hyperparathyroidism of renal origin: Secondary | ICD-10-CM | POA: Diagnosis not present

## 2016-08-10 DIAGNOSIS — D631 Anemia in chronic kidney disease: Secondary | ICD-10-CM | POA: Diagnosis not present

## 2016-08-10 DIAGNOSIS — E119 Type 2 diabetes mellitus without complications: Secondary | ICD-10-CM | POA: Diagnosis not present

## 2016-08-10 DIAGNOSIS — N186 End stage renal disease: Secondary | ICD-10-CM | POA: Diagnosis not present

## 2016-08-13 DIAGNOSIS — N186 End stage renal disease: Secondary | ICD-10-CM | POA: Diagnosis not present

## 2016-08-13 DIAGNOSIS — N2581 Secondary hyperparathyroidism of renal origin: Secondary | ICD-10-CM | POA: Diagnosis not present

## 2016-08-13 DIAGNOSIS — D509 Iron deficiency anemia, unspecified: Secondary | ICD-10-CM | POA: Diagnosis not present

## 2016-08-13 DIAGNOSIS — E119 Type 2 diabetes mellitus without complications: Secondary | ICD-10-CM | POA: Diagnosis not present

## 2016-08-13 DIAGNOSIS — D631 Anemia in chronic kidney disease: Secondary | ICD-10-CM | POA: Diagnosis not present

## 2016-08-15 DIAGNOSIS — N2581 Secondary hyperparathyroidism of renal origin: Secondary | ICD-10-CM | POA: Diagnosis not present

## 2016-08-15 DIAGNOSIS — N186 End stage renal disease: Secondary | ICD-10-CM | POA: Diagnosis not present

## 2016-08-15 DIAGNOSIS — E119 Type 2 diabetes mellitus without complications: Secondary | ICD-10-CM | POA: Diagnosis not present

## 2016-08-15 DIAGNOSIS — D631 Anemia in chronic kidney disease: Secondary | ICD-10-CM | POA: Diagnosis not present

## 2016-08-15 DIAGNOSIS — D509 Iron deficiency anemia, unspecified: Secondary | ICD-10-CM | POA: Diagnosis not present

## 2016-08-17 DIAGNOSIS — N186 End stage renal disease: Secondary | ICD-10-CM | POA: Diagnosis not present

## 2016-08-17 DIAGNOSIS — N2581 Secondary hyperparathyroidism of renal origin: Secondary | ICD-10-CM | POA: Diagnosis not present

## 2016-08-17 DIAGNOSIS — E119 Type 2 diabetes mellitus without complications: Secondary | ICD-10-CM | POA: Diagnosis not present

## 2016-08-17 DIAGNOSIS — D631 Anemia in chronic kidney disease: Secondary | ICD-10-CM | POA: Diagnosis not present

## 2016-08-17 DIAGNOSIS — D509 Iron deficiency anemia, unspecified: Secondary | ICD-10-CM | POA: Diagnosis not present

## 2016-08-20 DIAGNOSIS — D631 Anemia in chronic kidney disease: Secondary | ICD-10-CM | POA: Diagnosis not present

## 2016-08-20 DIAGNOSIS — N186 End stage renal disease: Secondary | ICD-10-CM | POA: Diagnosis not present

## 2016-08-20 DIAGNOSIS — E119 Type 2 diabetes mellitus without complications: Secondary | ICD-10-CM | POA: Diagnosis not present

## 2016-08-20 DIAGNOSIS — N2581 Secondary hyperparathyroidism of renal origin: Secondary | ICD-10-CM | POA: Diagnosis not present

## 2016-08-20 DIAGNOSIS — D509 Iron deficiency anemia, unspecified: Secondary | ICD-10-CM | POA: Diagnosis not present

## 2016-08-22 DIAGNOSIS — N186 End stage renal disease: Secondary | ICD-10-CM | POA: Diagnosis not present

## 2016-08-22 DIAGNOSIS — D509 Iron deficiency anemia, unspecified: Secondary | ICD-10-CM | POA: Diagnosis not present

## 2016-08-22 DIAGNOSIS — D631 Anemia in chronic kidney disease: Secondary | ICD-10-CM | POA: Diagnosis not present

## 2016-08-22 DIAGNOSIS — E119 Type 2 diabetes mellitus without complications: Secondary | ICD-10-CM | POA: Diagnosis not present

## 2016-08-22 DIAGNOSIS — N2581 Secondary hyperparathyroidism of renal origin: Secondary | ICD-10-CM | POA: Diagnosis not present

## 2016-08-23 DIAGNOSIS — D509 Iron deficiency anemia, unspecified: Secondary | ICD-10-CM | POA: Diagnosis not present

## 2016-08-23 DIAGNOSIS — N186 End stage renal disease: Secondary | ICD-10-CM | POA: Diagnosis not present

## 2016-08-23 DIAGNOSIS — D631 Anemia in chronic kidney disease: Secondary | ICD-10-CM | POA: Diagnosis not present

## 2016-08-23 DIAGNOSIS — N2581 Secondary hyperparathyroidism of renal origin: Secondary | ICD-10-CM | POA: Diagnosis not present

## 2016-08-23 DIAGNOSIS — E119 Type 2 diabetes mellitus without complications: Secondary | ICD-10-CM | POA: Diagnosis not present

## 2016-08-27 DIAGNOSIS — D631 Anemia in chronic kidney disease: Secondary | ICD-10-CM | POA: Diagnosis not present

## 2016-08-27 DIAGNOSIS — N186 End stage renal disease: Secondary | ICD-10-CM | POA: Diagnosis not present

## 2016-08-27 DIAGNOSIS — N2581 Secondary hyperparathyroidism of renal origin: Secondary | ICD-10-CM | POA: Diagnosis not present

## 2016-08-27 DIAGNOSIS — D509 Iron deficiency anemia, unspecified: Secondary | ICD-10-CM | POA: Diagnosis not present

## 2016-08-27 DIAGNOSIS — E119 Type 2 diabetes mellitus without complications: Secondary | ICD-10-CM | POA: Diagnosis not present

## 2016-08-29 DIAGNOSIS — E119 Type 2 diabetes mellitus without complications: Secondary | ICD-10-CM | POA: Diagnosis not present

## 2016-08-29 DIAGNOSIS — D509 Iron deficiency anemia, unspecified: Secondary | ICD-10-CM | POA: Diagnosis not present

## 2016-08-29 DIAGNOSIS — N186 End stage renal disease: Secondary | ICD-10-CM | POA: Diagnosis not present

## 2016-08-29 DIAGNOSIS — N2581 Secondary hyperparathyroidism of renal origin: Secondary | ICD-10-CM | POA: Diagnosis not present

## 2016-08-29 DIAGNOSIS — D631 Anemia in chronic kidney disease: Secondary | ICD-10-CM | POA: Diagnosis not present

## 2016-08-31 DIAGNOSIS — D509 Iron deficiency anemia, unspecified: Secondary | ICD-10-CM | POA: Diagnosis not present

## 2016-08-31 DIAGNOSIS — E119 Type 2 diabetes mellitus without complications: Secondary | ICD-10-CM | POA: Diagnosis not present

## 2016-08-31 DIAGNOSIS — N2581 Secondary hyperparathyroidism of renal origin: Secondary | ICD-10-CM | POA: Diagnosis not present

## 2016-08-31 DIAGNOSIS — N186 End stage renal disease: Secondary | ICD-10-CM | POA: Diagnosis not present

## 2016-08-31 DIAGNOSIS — D631 Anemia in chronic kidney disease: Secondary | ICD-10-CM | POA: Diagnosis not present

## 2016-09-03 DIAGNOSIS — N2581 Secondary hyperparathyroidism of renal origin: Secondary | ICD-10-CM | POA: Diagnosis not present

## 2016-09-03 DIAGNOSIS — D631 Anemia in chronic kidney disease: Secondary | ICD-10-CM | POA: Diagnosis not present

## 2016-09-03 DIAGNOSIS — N186 End stage renal disease: Secondary | ICD-10-CM | POA: Diagnosis not present

## 2016-09-03 DIAGNOSIS — D509 Iron deficiency anemia, unspecified: Secondary | ICD-10-CM | POA: Diagnosis not present

## 2016-09-03 DIAGNOSIS — E119 Type 2 diabetes mellitus without complications: Secondary | ICD-10-CM | POA: Diagnosis not present

## 2016-09-05 DIAGNOSIS — E119 Type 2 diabetes mellitus without complications: Secondary | ICD-10-CM | POA: Diagnosis not present

## 2016-09-05 DIAGNOSIS — N2581 Secondary hyperparathyroidism of renal origin: Secondary | ICD-10-CM | POA: Diagnosis not present

## 2016-09-05 DIAGNOSIS — D509 Iron deficiency anemia, unspecified: Secondary | ICD-10-CM | POA: Diagnosis not present

## 2016-09-05 DIAGNOSIS — N186 End stage renal disease: Secondary | ICD-10-CM | POA: Diagnosis not present

## 2016-09-05 DIAGNOSIS — Z992 Dependence on renal dialysis: Secondary | ICD-10-CM | POA: Diagnosis not present

## 2016-09-05 DIAGNOSIS — I120 Hypertensive chronic kidney disease with stage 5 chronic kidney disease or end stage renal disease: Secondary | ICD-10-CM | POA: Diagnosis not present

## 2016-09-05 DIAGNOSIS — D631 Anemia in chronic kidney disease: Secondary | ICD-10-CM | POA: Diagnosis not present

## 2016-09-06 DIAGNOSIS — D631 Anemia in chronic kidney disease: Secondary | ICD-10-CM | POA: Diagnosis not present

## 2016-09-06 DIAGNOSIS — N186 End stage renal disease: Secondary | ICD-10-CM | POA: Diagnosis not present

## 2016-09-06 DIAGNOSIS — E119 Type 2 diabetes mellitus without complications: Secondary | ICD-10-CM | POA: Diagnosis not present

## 2016-09-06 DIAGNOSIS — N2581 Secondary hyperparathyroidism of renal origin: Secondary | ICD-10-CM | POA: Diagnosis not present

## 2016-09-06 DIAGNOSIS — D509 Iron deficiency anemia, unspecified: Secondary | ICD-10-CM | POA: Diagnosis not present

## 2016-09-10 DIAGNOSIS — D509 Iron deficiency anemia, unspecified: Secondary | ICD-10-CM | POA: Diagnosis not present

## 2016-09-10 DIAGNOSIS — N2581 Secondary hyperparathyroidism of renal origin: Secondary | ICD-10-CM | POA: Diagnosis not present

## 2016-09-10 DIAGNOSIS — D631 Anemia in chronic kidney disease: Secondary | ICD-10-CM | POA: Diagnosis not present

## 2016-09-10 DIAGNOSIS — N186 End stage renal disease: Secondary | ICD-10-CM | POA: Diagnosis not present

## 2016-09-10 DIAGNOSIS — E119 Type 2 diabetes mellitus without complications: Secondary | ICD-10-CM | POA: Diagnosis not present

## 2016-09-12 DIAGNOSIS — D509 Iron deficiency anemia, unspecified: Secondary | ICD-10-CM | POA: Diagnosis not present

## 2016-09-12 DIAGNOSIS — D631 Anemia in chronic kidney disease: Secondary | ICD-10-CM | POA: Diagnosis not present

## 2016-09-12 DIAGNOSIS — E119 Type 2 diabetes mellitus without complications: Secondary | ICD-10-CM | POA: Diagnosis not present

## 2016-09-12 DIAGNOSIS — N2581 Secondary hyperparathyroidism of renal origin: Secondary | ICD-10-CM | POA: Diagnosis not present

## 2016-09-12 DIAGNOSIS — N186 End stage renal disease: Secondary | ICD-10-CM | POA: Diagnosis not present

## 2016-09-13 DIAGNOSIS — H35352 Cystoid macular degeneration, left eye: Secondary | ICD-10-CM | POA: Diagnosis not present

## 2016-09-13 DIAGNOSIS — H3521 Other non-diabetic proliferative retinopathy, right eye: Secondary | ICD-10-CM | POA: Diagnosis not present

## 2016-09-13 DIAGNOSIS — H401122 Primary open-angle glaucoma, left eye, moderate stage: Secondary | ICD-10-CM | POA: Diagnosis not present

## 2016-09-13 DIAGNOSIS — E113553 Type 2 diabetes mellitus with stable proliferative diabetic retinopathy, bilateral: Secondary | ICD-10-CM | POA: Diagnosis not present

## 2016-09-13 DIAGNOSIS — H4720 Unspecified optic atrophy: Secondary | ICD-10-CM | POA: Diagnosis not present

## 2016-09-13 DIAGNOSIS — H26492 Other secondary cataract, left eye: Secondary | ICD-10-CM | POA: Diagnosis not present

## 2016-09-13 DIAGNOSIS — H33051 Total retinal detachment, right eye: Secondary | ICD-10-CM | POA: Diagnosis not present

## 2016-09-14 DIAGNOSIS — N2581 Secondary hyperparathyroidism of renal origin: Secondary | ICD-10-CM | POA: Diagnosis not present

## 2016-09-14 DIAGNOSIS — D631 Anemia in chronic kidney disease: Secondary | ICD-10-CM | POA: Diagnosis not present

## 2016-09-14 DIAGNOSIS — E119 Type 2 diabetes mellitus without complications: Secondary | ICD-10-CM | POA: Diagnosis not present

## 2016-09-14 DIAGNOSIS — D509 Iron deficiency anemia, unspecified: Secondary | ICD-10-CM | POA: Diagnosis not present

## 2016-09-14 DIAGNOSIS — N186 End stage renal disease: Secondary | ICD-10-CM | POA: Diagnosis not present

## 2016-09-17 DIAGNOSIS — N186 End stage renal disease: Secondary | ICD-10-CM | POA: Diagnosis not present

## 2016-09-17 DIAGNOSIS — D509 Iron deficiency anemia, unspecified: Secondary | ICD-10-CM | POA: Diagnosis not present

## 2016-09-17 DIAGNOSIS — E119 Type 2 diabetes mellitus without complications: Secondary | ICD-10-CM | POA: Diagnosis not present

## 2016-09-17 DIAGNOSIS — N2581 Secondary hyperparathyroidism of renal origin: Secondary | ICD-10-CM | POA: Diagnosis not present

## 2016-09-17 DIAGNOSIS — D631 Anemia in chronic kidney disease: Secondary | ICD-10-CM | POA: Diagnosis not present

## 2016-09-19 DIAGNOSIS — N186 End stage renal disease: Secondary | ICD-10-CM | POA: Diagnosis not present

## 2016-09-19 DIAGNOSIS — E119 Type 2 diabetes mellitus without complications: Secondary | ICD-10-CM | POA: Diagnosis not present

## 2016-09-19 DIAGNOSIS — D631 Anemia in chronic kidney disease: Secondary | ICD-10-CM | POA: Diagnosis not present

## 2016-09-19 DIAGNOSIS — D509 Iron deficiency anemia, unspecified: Secondary | ICD-10-CM | POA: Diagnosis not present

## 2016-09-19 DIAGNOSIS — N2581 Secondary hyperparathyroidism of renal origin: Secondary | ICD-10-CM | POA: Diagnosis not present

## 2016-09-21 DIAGNOSIS — N186 End stage renal disease: Secondary | ICD-10-CM | POA: Diagnosis not present

## 2016-09-21 DIAGNOSIS — N2581 Secondary hyperparathyroidism of renal origin: Secondary | ICD-10-CM | POA: Diagnosis not present

## 2016-09-21 DIAGNOSIS — E119 Type 2 diabetes mellitus without complications: Secondary | ICD-10-CM | POA: Diagnosis not present

## 2016-09-21 DIAGNOSIS — D631 Anemia in chronic kidney disease: Secondary | ICD-10-CM | POA: Diagnosis not present

## 2016-09-21 DIAGNOSIS — D509 Iron deficiency anemia, unspecified: Secondary | ICD-10-CM | POA: Diagnosis not present

## 2016-09-24 DIAGNOSIS — E119 Type 2 diabetes mellitus without complications: Secondary | ICD-10-CM | POA: Diagnosis not present

## 2016-09-24 DIAGNOSIS — D509 Iron deficiency anemia, unspecified: Secondary | ICD-10-CM | POA: Diagnosis not present

## 2016-09-24 DIAGNOSIS — N2581 Secondary hyperparathyroidism of renal origin: Secondary | ICD-10-CM | POA: Diagnosis not present

## 2016-09-24 DIAGNOSIS — N186 End stage renal disease: Secondary | ICD-10-CM | POA: Diagnosis not present

## 2016-09-24 DIAGNOSIS — D631 Anemia in chronic kidney disease: Secondary | ICD-10-CM | POA: Diagnosis not present

## 2016-09-26 DIAGNOSIS — N186 End stage renal disease: Secondary | ICD-10-CM | POA: Diagnosis not present

## 2016-09-26 DIAGNOSIS — N2581 Secondary hyperparathyroidism of renal origin: Secondary | ICD-10-CM | POA: Diagnosis not present

## 2016-09-26 DIAGNOSIS — E119 Type 2 diabetes mellitus without complications: Secondary | ICD-10-CM | POA: Diagnosis not present

## 2016-09-26 DIAGNOSIS — D509 Iron deficiency anemia, unspecified: Secondary | ICD-10-CM | POA: Diagnosis not present

## 2016-09-26 DIAGNOSIS — D631 Anemia in chronic kidney disease: Secondary | ICD-10-CM | POA: Diagnosis not present

## 2016-09-28 DIAGNOSIS — N186 End stage renal disease: Secondary | ICD-10-CM | POA: Diagnosis not present

## 2016-09-28 DIAGNOSIS — E119 Type 2 diabetes mellitus without complications: Secondary | ICD-10-CM | POA: Diagnosis not present

## 2016-09-28 DIAGNOSIS — D631 Anemia in chronic kidney disease: Secondary | ICD-10-CM | POA: Diagnosis not present

## 2016-09-28 DIAGNOSIS — N2581 Secondary hyperparathyroidism of renal origin: Secondary | ICD-10-CM | POA: Diagnosis not present

## 2016-09-28 DIAGNOSIS — D509 Iron deficiency anemia, unspecified: Secondary | ICD-10-CM | POA: Diagnosis not present

## 2016-10-01 DIAGNOSIS — D631 Anemia in chronic kidney disease: Secondary | ICD-10-CM | POA: Diagnosis not present

## 2016-10-01 DIAGNOSIS — N2581 Secondary hyperparathyroidism of renal origin: Secondary | ICD-10-CM | POA: Diagnosis not present

## 2016-10-01 DIAGNOSIS — N186 End stage renal disease: Secondary | ICD-10-CM | POA: Diagnosis not present

## 2016-10-01 DIAGNOSIS — E119 Type 2 diabetes mellitus without complications: Secondary | ICD-10-CM | POA: Diagnosis not present

## 2016-10-01 DIAGNOSIS — D509 Iron deficiency anemia, unspecified: Secondary | ICD-10-CM | POA: Diagnosis not present

## 2016-10-02 DIAGNOSIS — E7800 Pure hypercholesterolemia, unspecified: Secondary | ICD-10-CM | POA: Diagnosis not present

## 2016-10-02 DIAGNOSIS — H3521 Other non-diabetic proliferative retinopathy, right eye: Secondary | ICD-10-CM | POA: Diagnosis not present

## 2016-10-02 DIAGNOSIS — I2510 Atherosclerotic heart disease of native coronary artery without angina pectoris: Secondary | ICD-10-CM | POA: Diagnosis not present

## 2016-10-02 DIAGNOSIS — I119 Hypertensive heart disease without heart failure: Secondary | ICD-10-CM | POA: Diagnosis not present

## 2016-10-02 DIAGNOSIS — I6990 Unspecified sequelae of unspecified cerebrovascular disease: Secondary | ICD-10-CM | POA: Diagnosis not present

## 2016-10-02 DIAGNOSIS — E1165 Type 2 diabetes mellitus with hyperglycemia: Secondary | ICD-10-CM | POA: Diagnosis not present

## 2016-10-02 DIAGNOSIS — Z79899 Other long term (current) drug therapy: Secondary | ICD-10-CM | POA: Diagnosis not present

## 2016-10-02 DIAGNOSIS — E669 Obesity, unspecified: Secondary | ICD-10-CM | POA: Diagnosis not present

## 2016-10-02 DIAGNOSIS — H548 Legal blindness, as defined in USA: Secondary | ICD-10-CM | POA: Diagnosis not present

## 2016-10-02 DIAGNOSIS — E559 Vitamin D deficiency, unspecified: Secondary | ICD-10-CM | POA: Diagnosis not present

## 2016-10-02 DIAGNOSIS — E113592 Type 2 diabetes mellitus with proliferative diabetic retinopathy without macular edema, left eye: Secondary | ICD-10-CM | POA: Diagnosis not present

## 2016-10-03 DIAGNOSIS — E119 Type 2 diabetes mellitus without complications: Secondary | ICD-10-CM | POA: Diagnosis not present

## 2016-10-03 DIAGNOSIS — N2581 Secondary hyperparathyroidism of renal origin: Secondary | ICD-10-CM | POA: Diagnosis not present

## 2016-10-03 DIAGNOSIS — D509 Iron deficiency anemia, unspecified: Secondary | ICD-10-CM | POA: Diagnosis not present

## 2016-10-03 DIAGNOSIS — N186 End stage renal disease: Secondary | ICD-10-CM | POA: Diagnosis not present

## 2016-10-03 DIAGNOSIS — D631 Anemia in chronic kidney disease: Secondary | ICD-10-CM | POA: Diagnosis not present

## 2016-10-04 ENCOUNTER — Ambulatory Visit: Payer: Medicare Other | Admitting: Podiatry

## 2016-10-04 ENCOUNTER — Encounter: Payer: Self-pay | Admitting: Podiatry

## 2016-10-04 ENCOUNTER — Ambulatory Visit (INDEPENDENT_AMBULATORY_CARE_PROVIDER_SITE_OTHER): Payer: Medicare Other | Admitting: Podiatry

## 2016-10-04 DIAGNOSIS — M2010 Hallux valgus (acquired), unspecified foot: Secondary | ICD-10-CM

## 2016-10-04 DIAGNOSIS — M79676 Pain in unspecified toe(s): Secondary | ICD-10-CM | POA: Diagnosis not present

## 2016-10-04 DIAGNOSIS — M2040 Other hammer toe(s) (acquired), unspecified foot: Secondary | ICD-10-CM

## 2016-10-04 DIAGNOSIS — B351 Tinea unguium: Secondary | ICD-10-CM | POA: Diagnosis not present

## 2016-10-04 DIAGNOSIS — E1140 Type 2 diabetes mellitus with diabetic neuropathy, unspecified: Secondary | ICD-10-CM

## 2016-10-04 NOTE — Progress Notes (Signed)
Patient ID: Shawn [name], male   DOB: 22-Feb-1961, 56 y.o.   MRN: [id number] Complaint:  Visit Type: Patient returns to my office for continued preventative foot care services. Complaint: Patient states" my nails have grown long and thick and become painful to walk and wear shoes" Patient has been diagnosed with DM with neuropathy and angiopathy.. The patient presents for preventative foot care services. No changes to ROS  Podiatric Exam: Vascular: dorsalis pedis and posterior tibial pulses are not  palpable bilateral. Capillary return is immediate. Cold feet  B/L Skin turgor WNL  Sensorium: Diminished Semmes Weinstein monofilament test. Normal tactile sensation bilaterally. Nail Exam: Pt has thick disfigured discolored nails with subungual debris noted bilateral entire nail hallux through fifth toenails Ulcer Exam: There is no evidence of ulcer or pre-ulcerative changes or infection. Orthopedic Exam: Muscle tone and strength are WNL. No limitations in general ROM. No crepitus or effusions noted. Foot type and digits show no abnormalities. HAV B/L  Hammer toe 2  B/L Skin: No Porokeratosis. No infection or ulcers.    No signs of redness or infection or swelling.  Diagnosis:  Onychomycosis, , Pain in right toe, pain in left toes  Treatment & Plan Procedures and Treatment: Consent by patient was obtained for treatment procedures. The patient understood the discussion of treatment and procedures well. All questions were answered thoroughly reviewed. Debridement of mycotic and hypertrophic toenails, 1 through 5 bilateral and clearing of subungual debris. No ulceration, no infection noted. Initiate diabetic shoe paperwork with HAV and HT. Paperwork never received back after it was sent. Return Visit-Office Procedure: Patient instructed to return to the office for a follow up visit 3 months for continued evaluation and treatment.  Gardiner Barefoot DPM

## 2016-10-05 DIAGNOSIS — N186 End stage renal disease: Secondary | ICD-10-CM | POA: Diagnosis not present

## 2016-10-05 DIAGNOSIS — D509 Iron deficiency anemia, unspecified: Secondary | ICD-10-CM | POA: Diagnosis not present

## 2016-10-05 DIAGNOSIS — D631 Anemia in chronic kidney disease: Secondary | ICD-10-CM | POA: Diagnosis not present

## 2016-10-05 DIAGNOSIS — E119 Type 2 diabetes mellitus without complications: Secondary | ICD-10-CM | POA: Diagnosis not present

## 2016-10-05 DIAGNOSIS — N2581 Secondary hyperparathyroidism of renal origin: Secondary | ICD-10-CM | POA: Diagnosis not present

## 2016-10-06 DIAGNOSIS — I120 Hypertensive chronic kidney disease with stage 5 chronic kidney disease or end stage renal disease: Secondary | ICD-10-CM | POA: Diagnosis not present

## 2016-10-06 DIAGNOSIS — Z992 Dependence on renal dialysis: Secondary | ICD-10-CM | POA: Diagnosis not present

## 2016-10-06 DIAGNOSIS — N186 End stage renal disease: Secondary | ICD-10-CM | POA: Diagnosis not present

## 2016-10-08 DIAGNOSIS — D631 Anemia in chronic kidney disease: Secondary | ICD-10-CM | POA: Diagnosis not present

## 2016-10-08 DIAGNOSIS — D509 Iron deficiency anemia, unspecified: Secondary | ICD-10-CM | POA: Diagnosis not present

## 2016-10-08 DIAGNOSIS — E119 Type 2 diabetes mellitus without complications: Secondary | ICD-10-CM | POA: Diagnosis not present

## 2016-10-08 DIAGNOSIS — Z23 Encounter for immunization: Secondary | ICD-10-CM | POA: Diagnosis not present

## 2016-10-08 DIAGNOSIS — N2581 Secondary hyperparathyroidism of renal origin: Secondary | ICD-10-CM | POA: Diagnosis not present

## 2016-10-08 DIAGNOSIS — N186 End stage renal disease: Secondary | ICD-10-CM | POA: Diagnosis not present

## 2016-10-10 DIAGNOSIS — N186 End stage renal disease: Secondary | ICD-10-CM | POA: Diagnosis not present

## 2016-10-10 DIAGNOSIS — D509 Iron deficiency anemia, unspecified: Secondary | ICD-10-CM | POA: Diagnosis not present

## 2016-10-10 DIAGNOSIS — E119 Type 2 diabetes mellitus without complications: Secondary | ICD-10-CM | POA: Diagnosis not present

## 2016-10-10 DIAGNOSIS — D631 Anemia in chronic kidney disease: Secondary | ICD-10-CM | POA: Diagnosis not present

## 2016-10-10 DIAGNOSIS — Z23 Encounter for immunization: Secondary | ICD-10-CM | POA: Diagnosis not present

## 2016-10-10 DIAGNOSIS — N2581 Secondary hyperparathyroidism of renal origin: Secondary | ICD-10-CM | POA: Diagnosis not present

## 2016-10-12 DIAGNOSIS — N2581 Secondary hyperparathyroidism of renal origin: Secondary | ICD-10-CM | POA: Diagnosis not present

## 2016-10-12 DIAGNOSIS — N186 End stage renal disease: Secondary | ICD-10-CM | POA: Diagnosis not present

## 2016-10-12 DIAGNOSIS — D509 Iron deficiency anemia, unspecified: Secondary | ICD-10-CM | POA: Diagnosis not present

## 2016-10-12 DIAGNOSIS — Z23 Encounter for immunization: Secondary | ICD-10-CM | POA: Diagnosis not present

## 2016-10-12 DIAGNOSIS — E119 Type 2 diabetes mellitus without complications: Secondary | ICD-10-CM | POA: Diagnosis not present

## 2016-10-12 DIAGNOSIS — D631 Anemia in chronic kidney disease: Secondary | ICD-10-CM | POA: Diagnosis not present

## 2016-10-15 DIAGNOSIS — Z23 Encounter for immunization: Secondary | ICD-10-CM | POA: Diagnosis not present

## 2016-10-15 DIAGNOSIS — E119 Type 2 diabetes mellitus without complications: Secondary | ICD-10-CM | POA: Diagnosis not present

## 2016-10-15 DIAGNOSIS — D509 Iron deficiency anemia, unspecified: Secondary | ICD-10-CM | POA: Diagnosis not present

## 2016-10-15 DIAGNOSIS — N2581 Secondary hyperparathyroidism of renal origin: Secondary | ICD-10-CM | POA: Diagnosis not present

## 2016-10-15 DIAGNOSIS — N186 End stage renal disease: Secondary | ICD-10-CM | POA: Diagnosis not present

## 2016-10-15 DIAGNOSIS — D631 Anemia in chronic kidney disease: Secondary | ICD-10-CM | POA: Diagnosis not present

## 2016-10-16 ENCOUNTER — Ambulatory Visit: Payer: Medicare Other

## 2016-10-17 DIAGNOSIS — D631 Anemia in chronic kidney disease: Secondary | ICD-10-CM | POA: Diagnosis not present

## 2016-10-17 DIAGNOSIS — Z23 Encounter for immunization: Secondary | ICD-10-CM | POA: Diagnosis not present

## 2016-10-17 DIAGNOSIS — D509 Iron deficiency anemia, unspecified: Secondary | ICD-10-CM | POA: Diagnosis not present

## 2016-10-17 DIAGNOSIS — N186 End stage renal disease: Secondary | ICD-10-CM | POA: Diagnosis not present

## 2016-10-17 DIAGNOSIS — N2581 Secondary hyperparathyroidism of renal origin: Secondary | ICD-10-CM | POA: Diagnosis not present

## 2016-10-17 DIAGNOSIS — E119 Type 2 diabetes mellitus without complications: Secondary | ICD-10-CM | POA: Diagnosis not present

## 2016-10-19 DIAGNOSIS — Z23 Encounter for immunization: Secondary | ICD-10-CM | POA: Diagnosis not present

## 2016-10-19 DIAGNOSIS — D631 Anemia in chronic kidney disease: Secondary | ICD-10-CM | POA: Diagnosis not present

## 2016-10-19 DIAGNOSIS — D509 Iron deficiency anemia, unspecified: Secondary | ICD-10-CM | POA: Diagnosis not present

## 2016-10-19 DIAGNOSIS — E119 Type 2 diabetes mellitus without complications: Secondary | ICD-10-CM | POA: Diagnosis not present

## 2016-10-19 DIAGNOSIS — N186 End stage renal disease: Secondary | ICD-10-CM | POA: Diagnosis not present

## 2016-10-19 DIAGNOSIS — N2581 Secondary hyperparathyroidism of renal origin: Secondary | ICD-10-CM | POA: Diagnosis not present

## 2016-10-22 DIAGNOSIS — N186 End stage renal disease: Secondary | ICD-10-CM | POA: Diagnosis not present

## 2016-10-22 DIAGNOSIS — D631 Anemia in chronic kidney disease: Secondary | ICD-10-CM | POA: Diagnosis not present

## 2016-10-22 DIAGNOSIS — Z23 Encounter for immunization: Secondary | ICD-10-CM | POA: Diagnosis not present

## 2016-10-22 DIAGNOSIS — E119 Type 2 diabetes mellitus without complications: Secondary | ICD-10-CM | POA: Diagnosis not present

## 2016-10-22 DIAGNOSIS — D509 Iron deficiency anemia, unspecified: Secondary | ICD-10-CM | POA: Diagnosis not present

## 2016-10-22 DIAGNOSIS — N2581 Secondary hyperparathyroidism of renal origin: Secondary | ICD-10-CM | POA: Diagnosis not present

## 2016-10-24 DIAGNOSIS — E119 Type 2 diabetes mellitus without complications: Secondary | ICD-10-CM | POA: Diagnosis not present

## 2016-10-24 DIAGNOSIS — Z23 Encounter for immunization: Secondary | ICD-10-CM | POA: Diagnosis not present

## 2016-10-24 DIAGNOSIS — D631 Anemia in chronic kidney disease: Secondary | ICD-10-CM | POA: Diagnosis not present

## 2016-10-24 DIAGNOSIS — N186 End stage renal disease: Secondary | ICD-10-CM | POA: Diagnosis not present

## 2016-10-24 DIAGNOSIS — D509 Iron deficiency anemia, unspecified: Secondary | ICD-10-CM | POA: Diagnosis not present

## 2016-10-24 DIAGNOSIS — N2581 Secondary hyperparathyroidism of renal origin: Secondary | ICD-10-CM | POA: Diagnosis not present

## 2016-10-26 DIAGNOSIS — N2581 Secondary hyperparathyroidism of renal origin: Secondary | ICD-10-CM | POA: Diagnosis not present

## 2016-10-26 DIAGNOSIS — Z23 Encounter for immunization: Secondary | ICD-10-CM | POA: Diagnosis not present

## 2016-10-26 DIAGNOSIS — D631 Anemia in chronic kidney disease: Secondary | ICD-10-CM | POA: Diagnosis not present

## 2016-10-26 DIAGNOSIS — N186 End stage renal disease: Secondary | ICD-10-CM | POA: Diagnosis not present

## 2016-10-26 DIAGNOSIS — E119 Type 2 diabetes mellitus without complications: Secondary | ICD-10-CM | POA: Diagnosis not present

## 2016-10-26 DIAGNOSIS — D509 Iron deficiency anemia, unspecified: Secondary | ICD-10-CM | POA: Diagnosis not present

## 2016-10-29 DIAGNOSIS — Z23 Encounter for immunization: Secondary | ICD-10-CM | POA: Diagnosis not present

## 2016-10-29 DIAGNOSIS — D509 Iron deficiency anemia, unspecified: Secondary | ICD-10-CM | POA: Diagnosis not present

## 2016-10-29 DIAGNOSIS — N2581 Secondary hyperparathyroidism of renal origin: Secondary | ICD-10-CM | POA: Diagnosis not present

## 2016-10-29 DIAGNOSIS — N186 End stage renal disease: Secondary | ICD-10-CM | POA: Diagnosis not present

## 2016-10-29 DIAGNOSIS — E119 Type 2 diabetes mellitus without complications: Secondary | ICD-10-CM | POA: Diagnosis not present

## 2016-10-29 DIAGNOSIS — D631 Anemia in chronic kidney disease: Secondary | ICD-10-CM | POA: Diagnosis not present

## 2016-10-31 DIAGNOSIS — N2581 Secondary hyperparathyroidism of renal origin: Secondary | ICD-10-CM | POA: Diagnosis not present

## 2016-10-31 DIAGNOSIS — D509 Iron deficiency anemia, unspecified: Secondary | ICD-10-CM | POA: Diagnosis not present

## 2016-10-31 DIAGNOSIS — E119 Type 2 diabetes mellitus without complications: Secondary | ICD-10-CM | POA: Diagnosis not present

## 2016-10-31 DIAGNOSIS — E1129 Type 2 diabetes mellitus with other diabetic kidney complication: Secondary | ICD-10-CM | POA: Diagnosis not present

## 2016-10-31 DIAGNOSIS — D631 Anemia in chronic kidney disease: Secondary | ICD-10-CM | POA: Diagnosis not present

## 2016-10-31 DIAGNOSIS — Z23 Encounter for immunization: Secondary | ICD-10-CM | POA: Diagnosis not present

## 2016-10-31 DIAGNOSIS — N186 End stage renal disease: Secondary | ICD-10-CM | POA: Diagnosis not present

## 2016-11-02 DIAGNOSIS — D631 Anemia in chronic kidney disease: Secondary | ICD-10-CM | POA: Diagnosis not present

## 2016-11-02 DIAGNOSIS — Z23 Encounter for immunization: Secondary | ICD-10-CM | POA: Diagnosis not present

## 2016-11-02 DIAGNOSIS — N186 End stage renal disease: Secondary | ICD-10-CM | POA: Diagnosis not present

## 2016-11-02 DIAGNOSIS — E119 Type 2 diabetes mellitus without complications: Secondary | ICD-10-CM | POA: Diagnosis not present

## 2016-11-02 DIAGNOSIS — D509 Iron deficiency anemia, unspecified: Secondary | ICD-10-CM | POA: Diagnosis not present

## 2016-11-02 DIAGNOSIS — N2581 Secondary hyperparathyroidism of renal origin: Secondary | ICD-10-CM | POA: Diagnosis not present

## 2016-11-05 DIAGNOSIS — N2581 Secondary hyperparathyroidism of renal origin: Secondary | ICD-10-CM | POA: Diagnosis not present

## 2016-11-05 DIAGNOSIS — D631 Anemia in chronic kidney disease: Secondary | ICD-10-CM | POA: Diagnosis not present

## 2016-11-05 DIAGNOSIS — N186 End stage renal disease: Secondary | ICD-10-CM | POA: Diagnosis not present

## 2016-11-05 DIAGNOSIS — E119 Type 2 diabetes mellitus without complications: Secondary | ICD-10-CM | POA: Diagnosis not present

## 2016-11-05 DIAGNOSIS — Z992 Dependence on renal dialysis: Secondary | ICD-10-CM | POA: Diagnosis not present

## 2016-11-05 DIAGNOSIS — Z23 Encounter for immunization: Secondary | ICD-10-CM | POA: Diagnosis not present

## 2016-11-05 DIAGNOSIS — D509 Iron deficiency anemia, unspecified: Secondary | ICD-10-CM | POA: Diagnosis not present

## 2016-11-05 DIAGNOSIS — I120 Hypertensive chronic kidney disease with stage 5 chronic kidney disease or end stage renal disease: Secondary | ICD-10-CM | POA: Diagnosis not present

## 2016-11-07 DIAGNOSIS — E119 Type 2 diabetes mellitus without complications: Secondary | ICD-10-CM | POA: Diagnosis not present

## 2016-11-07 DIAGNOSIS — D509 Iron deficiency anemia, unspecified: Secondary | ICD-10-CM | POA: Diagnosis not present

## 2016-11-07 DIAGNOSIS — D631 Anemia in chronic kidney disease: Secondary | ICD-10-CM | POA: Diagnosis not present

## 2016-11-07 DIAGNOSIS — N186 End stage renal disease: Secondary | ICD-10-CM | POA: Diagnosis not present

## 2016-11-07 DIAGNOSIS — N2581 Secondary hyperparathyroidism of renal origin: Secondary | ICD-10-CM | POA: Diagnosis not present

## 2016-11-09 DIAGNOSIS — N2581 Secondary hyperparathyroidism of renal origin: Secondary | ICD-10-CM | POA: Diagnosis not present

## 2016-11-09 DIAGNOSIS — D509 Iron deficiency anemia, unspecified: Secondary | ICD-10-CM | POA: Diagnosis not present

## 2016-11-09 DIAGNOSIS — E119 Type 2 diabetes mellitus without complications: Secondary | ICD-10-CM | POA: Diagnosis not present

## 2016-11-09 DIAGNOSIS — D631 Anemia in chronic kidney disease: Secondary | ICD-10-CM | POA: Diagnosis not present

## 2016-11-09 DIAGNOSIS — N186 End stage renal disease: Secondary | ICD-10-CM | POA: Diagnosis not present

## 2016-11-11 IMAGING — CR DG CHEST 2V
2 series · 2 of 2 positions shown · non-contrast
Comparison: 05/12/2013

CLINICAL DATA: Dialysis, dizziness, weakness

EXAM:
CHEST  2 VIEW

[chest pa]
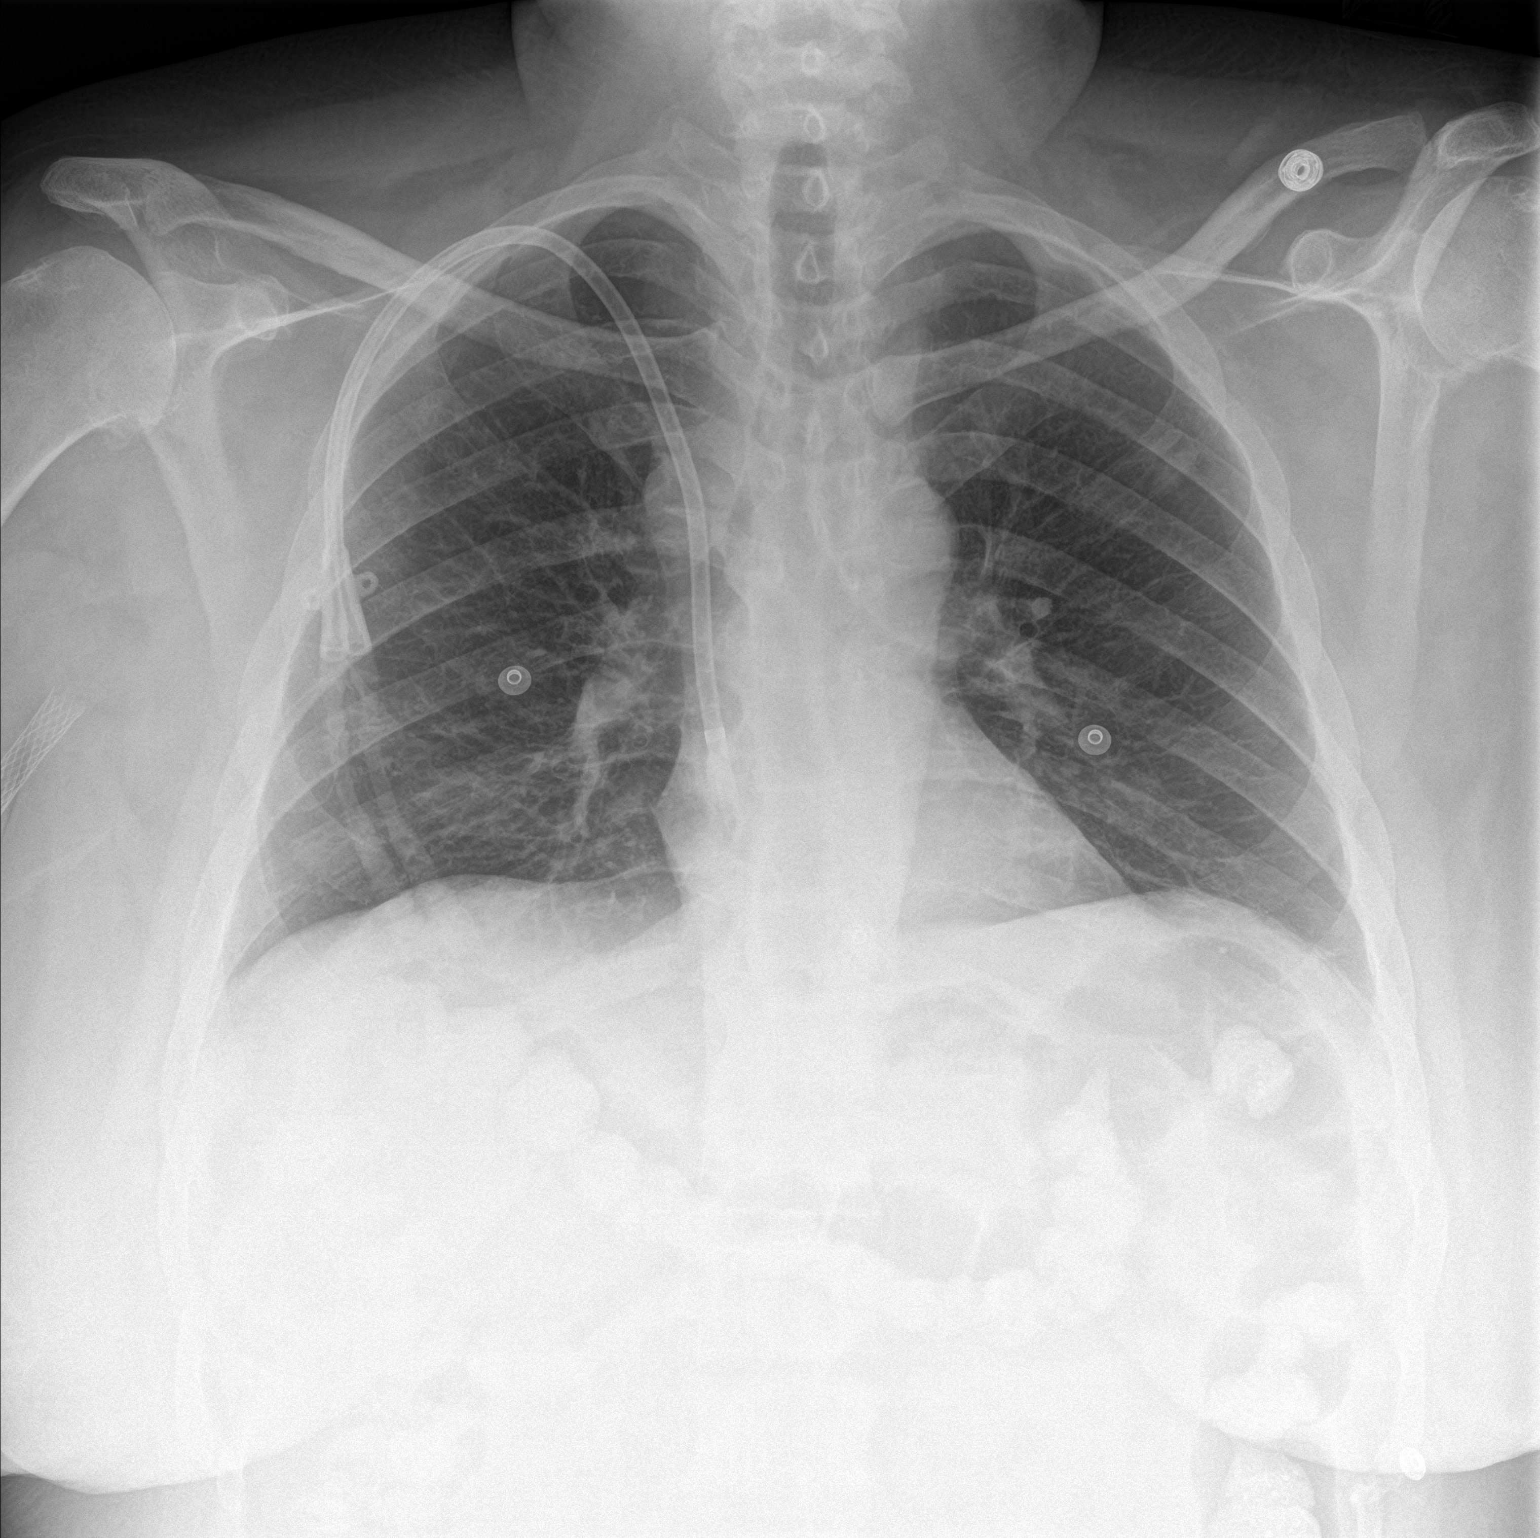

[chest lat]
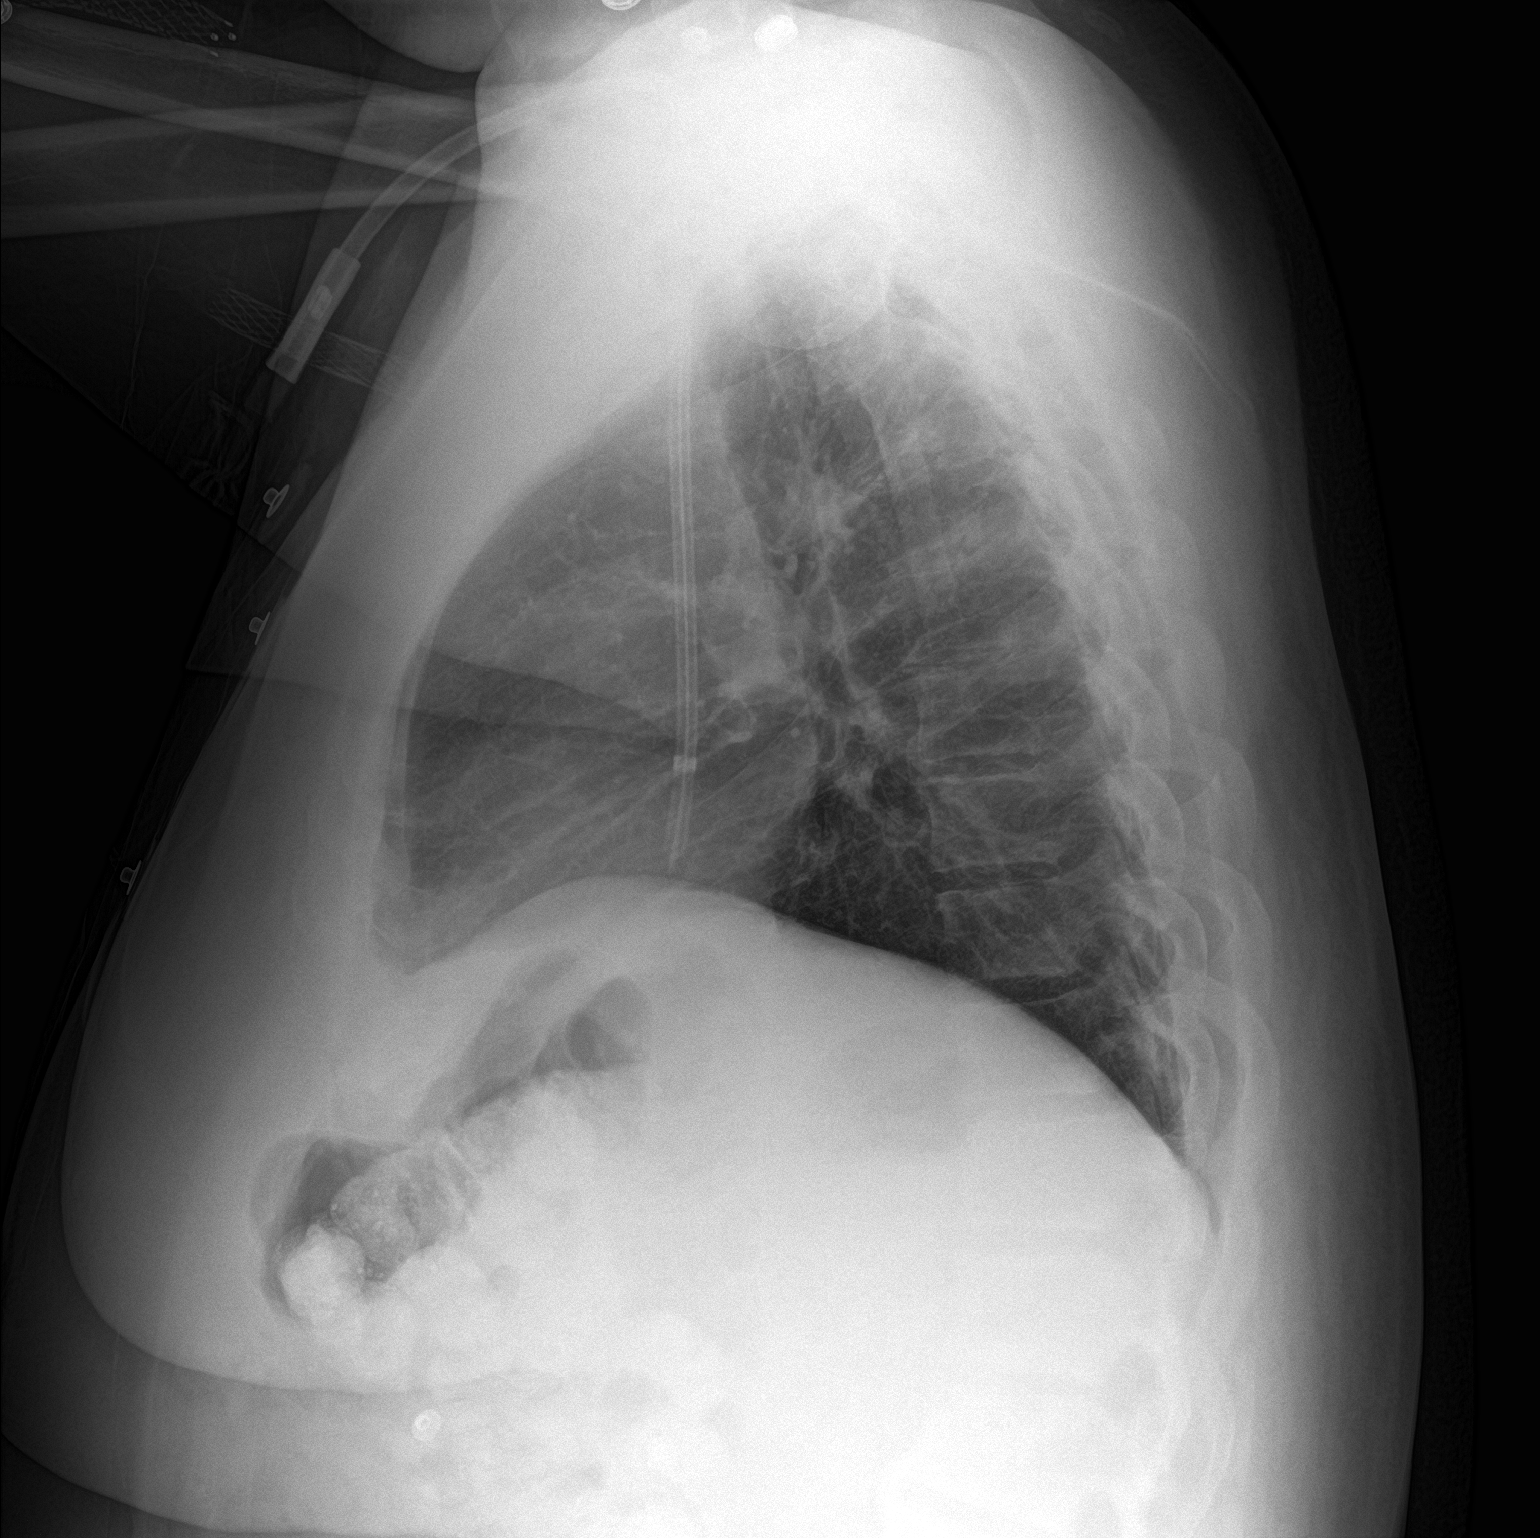

[2 of 2 positions shown; findings below may reference images not displayed]

FINDINGS: Lungs are clear.  No pleural effusion or pneumothorax.

The heart is normal in size. Right dual lumen dialysis catheter
terminates in the upper right atrium.

Visualized osseous structures are within normal limits.
IMPRESSION: No evidence of acute cardiopulmonary disease.

## 2016-11-12 DIAGNOSIS — N186 End stage renal disease: Secondary | ICD-10-CM | POA: Diagnosis not present

## 2016-11-12 DIAGNOSIS — N2581 Secondary hyperparathyroidism of renal origin: Secondary | ICD-10-CM | POA: Diagnosis not present

## 2016-11-12 DIAGNOSIS — E119 Type 2 diabetes mellitus without complications: Secondary | ICD-10-CM | POA: Diagnosis not present

## 2016-11-12 DIAGNOSIS — D631 Anemia in chronic kidney disease: Secondary | ICD-10-CM | POA: Diagnosis not present

## 2016-11-12 DIAGNOSIS — D509 Iron deficiency anemia, unspecified: Secondary | ICD-10-CM | POA: Diagnosis not present

## 2016-11-14 DIAGNOSIS — N2581 Secondary hyperparathyroidism of renal origin: Secondary | ICD-10-CM | POA: Diagnosis not present

## 2016-11-14 DIAGNOSIS — E119 Type 2 diabetes mellitus without complications: Secondary | ICD-10-CM | POA: Diagnosis not present

## 2016-11-14 DIAGNOSIS — N186 End stage renal disease: Secondary | ICD-10-CM | POA: Diagnosis not present

## 2016-11-14 DIAGNOSIS — D509 Iron deficiency anemia, unspecified: Secondary | ICD-10-CM | POA: Diagnosis not present

## 2016-11-14 DIAGNOSIS — D631 Anemia in chronic kidney disease: Secondary | ICD-10-CM | POA: Diagnosis not present

## 2016-11-16 DIAGNOSIS — D631 Anemia in chronic kidney disease: Secondary | ICD-10-CM | POA: Diagnosis not present

## 2016-11-16 DIAGNOSIS — N186 End stage renal disease: Secondary | ICD-10-CM | POA: Diagnosis not present

## 2016-11-16 DIAGNOSIS — N2581 Secondary hyperparathyroidism of renal origin: Secondary | ICD-10-CM | POA: Diagnosis not present

## 2016-11-16 DIAGNOSIS — E119 Type 2 diabetes mellitus without complications: Secondary | ICD-10-CM | POA: Diagnosis not present

## 2016-11-16 DIAGNOSIS — D509 Iron deficiency anemia, unspecified: Secondary | ICD-10-CM | POA: Diagnosis not present

## 2016-11-19 DIAGNOSIS — N2581 Secondary hyperparathyroidism of renal origin: Secondary | ICD-10-CM | POA: Diagnosis not present

## 2016-11-19 DIAGNOSIS — D631 Anemia in chronic kidney disease: Secondary | ICD-10-CM | POA: Diagnosis not present

## 2016-11-19 DIAGNOSIS — E119 Type 2 diabetes mellitus without complications: Secondary | ICD-10-CM | POA: Diagnosis not present

## 2016-11-19 DIAGNOSIS — D509 Iron deficiency anemia, unspecified: Secondary | ICD-10-CM | POA: Diagnosis not present

## 2016-11-19 DIAGNOSIS — N186 End stage renal disease: Secondary | ICD-10-CM | POA: Diagnosis not present

## 2016-11-21 DIAGNOSIS — N2581 Secondary hyperparathyroidism of renal origin: Secondary | ICD-10-CM | POA: Diagnosis not present

## 2016-11-21 DIAGNOSIS — D509 Iron deficiency anemia, unspecified: Secondary | ICD-10-CM | POA: Diagnosis not present

## 2016-11-21 DIAGNOSIS — N186 End stage renal disease: Secondary | ICD-10-CM | POA: Diagnosis not present

## 2016-11-21 DIAGNOSIS — D631 Anemia in chronic kidney disease: Secondary | ICD-10-CM | POA: Diagnosis not present

## 2016-11-21 DIAGNOSIS — E119 Type 2 diabetes mellitus without complications: Secondary | ICD-10-CM | POA: Diagnosis not present

## 2016-11-23 DIAGNOSIS — E119 Type 2 diabetes mellitus without complications: Secondary | ICD-10-CM | POA: Diagnosis not present

## 2016-11-23 DIAGNOSIS — N186 End stage renal disease: Secondary | ICD-10-CM | POA: Diagnosis not present

## 2016-11-23 DIAGNOSIS — N2581 Secondary hyperparathyroidism of renal origin: Secondary | ICD-10-CM | POA: Diagnosis not present

## 2016-11-23 DIAGNOSIS — D509 Iron deficiency anemia, unspecified: Secondary | ICD-10-CM | POA: Diagnosis not present

## 2016-11-23 DIAGNOSIS — D631 Anemia in chronic kidney disease: Secondary | ICD-10-CM | POA: Diagnosis not present

## 2016-11-26 DIAGNOSIS — N2581 Secondary hyperparathyroidism of renal origin: Secondary | ICD-10-CM | POA: Diagnosis not present

## 2016-11-26 DIAGNOSIS — N186 End stage renal disease: Secondary | ICD-10-CM | POA: Diagnosis not present

## 2016-11-26 DIAGNOSIS — D631 Anemia in chronic kidney disease: Secondary | ICD-10-CM | POA: Diagnosis not present

## 2016-11-26 DIAGNOSIS — E119 Type 2 diabetes mellitus without complications: Secondary | ICD-10-CM | POA: Diagnosis not present

## 2016-11-26 DIAGNOSIS — D509 Iron deficiency anemia, unspecified: Secondary | ICD-10-CM | POA: Diagnosis not present

## 2016-11-28 DIAGNOSIS — E119 Type 2 diabetes mellitus without complications: Secondary | ICD-10-CM | POA: Diagnosis not present

## 2016-11-28 DIAGNOSIS — N2581 Secondary hyperparathyroidism of renal origin: Secondary | ICD-10-CM | POA: Diagnosis not present

## 2016-11-28 DIAGNOSIS — N186 End stage renal disease: Secondary | ICD-10-CM | POA: Diagnosis not present

## 2016-11-28 DIAGNOSIS — D631 Anemia in chronic kidney disease: Secondary | ICD-10-CM | POA: Diagnosis not present

## 2016-11-28 DIAGNOSIS — D509 Iron deficiency anemia, unspecified: Secondary | ICD-10-CM | POA: Diagnosis not present

## 2016-12-01 DIAGNOSIS — D631 Anemia in chronic kidney disease: Secondary | ICD-10-CM | POA: Diagnosis not present

## 2016-12-01 DIAGNOSIS — D509 Iron deficiency anemia, unspecified: Secondary | ICD-10-CM | POA: Diagnosis not present

## 2016-12-01 DIAGNOSIS — N2581 Secondary hyperparathyroidism of renal origin: Secondary | ICD-10-CM | POA: Diagnosis not present

## 2016-12-01 DIAGNOSIS — E119 Type 2 diabetes mellitus without complications: Secondary | ICD-10-CM | POA: Diagnosis not present

## 2016-12-01 DIAGNOSIS — N186 End stage renal disease: Secondary | ICD-10-CM | POA: Diagnosis not present

## 2016-12-03 DIAGNOSIS — N186 End stage renal disease: Secondary | ICD-10-CM | POA: Diagnosis not present

## 2016-12-03 DIAGNOSIS — N2581 Secondary hyperparathyroidism of renal origin: Secondary | ICD-10-CM | POA: Diagnosis not present

## 2016-12-03 DIAGNOSIS — D631 Anemia in chronic kidney disease: Secondary | ICD-10-CM | POA: Diagnosis not present

## 2016-12-03 DIAGNOSIS — D509 Iron deficiency anemia, unspecified: Secondary | ICD-10-CM | POA: Diagnosis not present

## 2016-12-03 DIAGNOSIS — E119 Type 2 diabetes mellitus without complications: Secondary | ICD-10-CM | POA: Diagnosis not present

## 2016-12-05 DIAGNOSIS — E119 Type 2 diabetes mellitus without complications: Secondary | ICD-10-CM | POA: Diagnosis not present

## 2016-12-05 DIAGNOSIS — N2581 Secondary hyperparathyroidism of renal origin: Secondary | ICD-10-CM | POA: Diagnosis not present

## 2016-12-05 DIAGNOSIS — N186 End stage renal disease: Secondary | ICD-10-CM | POA: Diagnosis not present

## 2016-12-05 DIAGNOSIS — D631 Anemia in chronic kidney disease: Secondary | ICD-10-CM | POA: Diagnosis not present

## 2016-12-05 DIAGNOSIS — D509 Iron deficiency anemia, unspecified: Secondary | ICD-10-CM | POA: Diagnosis not present

## 2016-12-06 DIAGNOSIS — Z992 Dependence on renal dialysis: Secondary | ICD-10-CM | POA: Diagnosis not present

## 2016-12-06 DIAGNOSIS — N186 End stage renal disease: Secondary | ICD-10-CM | POA: Diagnosis not present

## 2016-12-06 DIAGNOSIS — E119 Type 2 diabetes mellitus without complications: Secondary | ICD-10-CM | POA: Diagnosis not present

## 2016-12-06 DIAGNOSIS — D631 Anemia in chronic kidney disease: Secondary | ICD-10-CM | POA: Diagnosis not present

## 2016-12-06 DIAGNOSIS — N2581 Secondary hyperparathyroidism of renal origin: Secondary | ICD-10-CM | POA: Diagnosis not present

## 2016-12-06 DIAGNOSIS — I120 Hypertensive chronic kidney disease with stage 5 chronic kidney disease or end stage renal disease: Secondary | ICD-10-CM | POA: Diagnosis not present

## 2016-12-06 DIAGNOSIS — D509 Iron deficiency anemia, unspecified: Secondary | ICD-10-CM | POA: Diagnosis not present

## 2016-12-10 DIAGNOSIS — N186 End stage renal disease: Secondary | ICD-10-CM | POA: Diagnosis not present

## 2016-12-10 DIAGNOSIS — E119 Type 2 diabetes mellitus without complications: Secondary | ICD-10-CM | POA: Diagnosis not present

## 2016-12-10 DIAGNOSIS — N2581 Secondary hyperparathyroidism of renal origin: Secondary | ICD-10-CM | POA: Diagnosis not present

## 2016-12-10 DIAGNOSIS — D509 Iron deficiency anemia, unspecified: Secondary | ICD-10-CM | POA: Diagnosis not present

## 2016-12-10 DIAGNOSIS — D631 Anemia in chronic kidney disease: Secondary | ICD-10-CM | POA: Diagnosis not present

## 2016-12-12 DIAGNOSIS — N186 End stage renal disease: Secondary | ICD-10-CM | POA: Diagnosis not present

## 2016-12-12 DIAGNOSIS — N2581 Secondary hyperparathyroidism of renal origin: Secondary | ICD-10-CM | POA: Diagnosis not present

## 2016-12-12 DIAGNOSIS — E119 Type 2 diabetes mellitus without complications: Secondary | ICD-10-CM | POA: Diagnosis not present

## 2016-12-12 DIAGNOSIS — D631 Anemia in chronic kidney disease: Secondary | ICD-10-CM | POA: Diagnosis not present

## 2016-12-12 DIAGNOSIS — D509 Iron deficiency anemia, unspecified: Secondary | ICD-10-CM | POA: Diagnosis not present

## 2016-12-14 DIAGNOSIS — E119 Type 2 diabetes mellitus without complications: Secondary | ICD-10-CM | POA: Diagnosis not present

## 2016-12-14 DIAGNOSIS — D631 Anemia in chronic kidney disease: Secondary | ICD-10-CM | POA: Diagnosis not present

## 2016-12-14 DIAGNOSIS — D509 Iron deficiency anemia, unspecified: Secondary | ICD-10-CM | POA: Diagnosis not present

## 2016-12-14 DIAGNOSIS — N2581 Secondary hyperparathyroidism of renal origin: Secondary | ICD-10-CM | POA: Diagnosis not present

## 2016-12-14 DIAGNOSIS — N186 End stage renal disease: Secondary | ICD-10-CM | POA: Diagnosis not present

## 2016-12-17 DIAGNOSIS — D631 Anemia in chronic kidney disease: Secondary | ICD-10-CM | POA: Diagnosis not present

## 2016-12-17 DIAGNOSIS — N186 End stage renal disease: Secondary | ICD-10-CM | POA: Diagnosis not present

## 2016-12-17 DIAGNOSIS — D509 Iron deficiency anemia, unspecified: Secondary | ICD-10-CM | POA: Diagnosis not present

## 2016-12-17 DIAGNOSIS — N2581 Secondary hyperparathyroidism of renal origin: Secondary | ICD-10-CM | POA: Diagnosis not present

## 2016-12-17 DIAGNOSIS — E119 Type 2 diabetes mellitus without complications: Secondary | ICD-10-CM | POA: Diagnosis not present

## 2016-12-19 DIAGNOSIS — N2581 Secondary hyperparathyroidism of renal origin: Secondary | ICD-10-CM | POA: Diagnosis not present

## 2016-12-19 DIAGNOSIS — E119 Type 2 diabetes mellitus without complications: Secondary | ICD-10-CM | POA: Diagnosis not present

## 2016-12-19 DIAGNOSIS — D631 Anemia in chronic kidney disease: Secondary | ICD-10-CM | POA: Diagnosis not present

## 2016-12-19 DIAGNOSIS — D509 Iron deficiency anemia, unspecified: Secondary | ICD-10-CM | POA: Diagnosis not present

## 2016-12-19 DIAGNOSIS — N186 End stage renal disease: Secondary | ICD-10-CM | POA: Diagnosis not present

## 2016-12-20 DIAGNOSIS — H401123 Primary open-angle glaucoma, left eye, severe stage: Secondary | ICD-10-CM | POA: Diagnosis not present

## 2016-12-21 DIAGNOSIS — D631 Anemia in chronic kidney disease: Secondary | ICD-10-CM | POA: Diagnosis not present

## 2016-12-21 DIAGNOSIS — N186 End stage renal disease: Secondary | ICD-10-CM | POA: Diagnosis not present

## 2016-12-21 DIAGNOSIS — N2581 Secondary hyperparathyroidism of renal origin: Secondary | ICD-10-CM | POA: Diagnosis not present

## 2016-12-21 DIAGNOSIS — E119 Type 2 diabetes mellitus without complications: Secondary | ICD-10-CM | POA: Diagnosis not present

## 2016-12-21 DIAGNOSIS — D509 Iron deficiency anemia, unspecified: Secondary | ICD-10-CM | POA: Diagnosis not present

## 2016-12-24 DIAGNOSIS — D509 Iron deficiency anemia, unspecified: Secondary | ICD-10-CM | POA: Diagnosis not present

## 2016-12-24 DIAGNOSIS — N186 End stage renal disease: Secondary | ICD-10-CM | POA: Diagnosis not present

## 2016-12-24 DIAGNOSIS — D631 Anemia in chronic kidney disease: Secondary | ICD-10-CM | POA: Diagnosis not present

## 2016-12-24 DIAGNOSIS — E119 Type 2 diabetes mellitus without complications: Secondary | ICD-10-CM | POA: Diagnosis not present

## 2016-12-24 DIAGNOSIS — N2581 Secondary hyperparathyroidism of renal origin: Secondary | ICD-10-CM | POA: Diagnosis not present

## 2016-12-26 DIAGNOSIS — D509 Iron deficiency anemia, unspecified: Secondary | ICD-10-CM | POA: Diagnosis not present

## 2016-12-26 DIAGNOSIS — E119 Type 2 diabetes mellitus without complications: Secondary | ICD-10-CM | POA: Diagnosis not present

## 2016-12-26 DIAGNOSIS — N186 End stage renal disease: Secondary | ICD-10-CM | POA: Diagnosis not present

## 2016-12-26 DIAGNOSIS — D631 Anemia in chronic kidney disease: Secondary | ICD-10-CM | POA: Diagnosis not present

## 2016-12-26 DIAGNOSIS — N2581 Secondary hyperparathyroidism of renal origin: Secondary | ICD-10-CM | POA: Diagnosis not present

## 2016-12-28 DIAGNOSIS — D631 Anemia in chronic kidney disease: Secondary | ICD-10-CM | POA: Diagnosis not present

## 2016-12-28 DIAGNOSIS — D509 Iron deficiency anemia, unspecified: Secondary | ICD-10-CM | POA: Diagnosis not present

## 2016-12-28 DIAGNOSIS — N2581 Secondary hyperparathyroidism of renal origin: Secondary | ICD-10-CM | POA: Diagnosis not present

## 2016-12-28 DIAGNOSIS — E119 Type 2 diabetes mellitus without complications: Secondary | ICD-10-CM | POA: Diagnosis not present

## 2016-12-28 DIAGNOSIS — N186 End stage renal disease: Secondary | ICD-10-CM | POA: Diagnosis not present

## 2016-12-31 DIAGNOSIS — E119 Type 2 diabetes mellitus without complications: Secondary | ICD-10-CM | POA: Diagnosis not present

## 2016-12-31 DIAGNOSIS — D631 Anemia in chronic kidney disease: Secondary | ICD-10-CM | POA: Diagnosis not present

## 2016-12-31 DIAGNOSIS — N2581 Secondary hyperparathyroidism of renal origin: Secondary | ICD-10-CM | POA: Diagnosis not present

## 2016-12-31 DIAGNOSIS — N186 End stage renal disease: Secondary | ICD-10-CM | POA: Diagnosis not present

## 2016-12-31 DIAGNOSIS — D509 Iron deficiency anemia, unspecified: Secondary | ICD-10-CM | POA: Diagnosis not present

## 2017-01-02 DIAGNOSIS — N2581 Secondary hyperparathyroidism of renal origin: Secondary | ICD-10-CM | POA: Diagnosis not present

## 2017-01-02 DIAGNOSIS — D631 Anemia in chronic kidney disease: Secondary | ICD-10-CM | POA: Diagnosis not present

## 2017-01-02 DIAGNOSIS — D509 Iron deficiency anemia, unspecified: Secondary | ICD-10-CM | POA: Diagnosis not present

## 2017-01-02 DIAGNOSIS — E119 Type 2 diabetes mellitus without complications: Secondary | ICD-10-CM | POA: Diagnosis not present

## 2017-01-02 DIAGNOSIS — N186 End stage renal disease: Secondary | ICD-10-CM | POA: Diagnosis not present

## 2017-01-04 DIAGNOSIS — D509 Iron deficiency anemia, unspecified: Secondary | ICD-10-CM | POA: Diagnosis not present

## 2017-01-04 DIAGNOSIS — N2581 Secondary hyperparathyroidism of renal origin: Secondary | ICD-10-CM | POA: Diagnosis not present

## 2017-01-04 DIAGNOSIS — D631 Anemia in chronic kidney disease: Secondary | ICD-10-CM | POA: Diagnosis not present

## 2017-01-04 DIAGNOSIS — E119 Type 2 diabetes mellitus without complications: Secondary | ICD-10-CM | POA: Diagnosis not present

## 2017-01-04 DIAGNOSIS — N186 End stage renal disease: Secondary | ICD-10-CM | POA: Diagnosis not present

## 2017-01-05 DIAGNOSIS — I120 Hypertensive chronic kidney disease with stage 5 chronic kidney disease or end stage renal disease: Secondary | ICD-10-CM | POA: Diagnosis not present

## 2017-01-05 DIAGNOSIS — Z992 Dependence on renal dialysis: Secondary | ICD-10-CM | POA: Diagnosis not present

## 2017-01-05 DIAGNOSIS — N186 End stage renal disease: Secondary | ICD-10-CM | POA: Diagnosis not present

## 2017-01-07 DIAGNOSIS — N186 End stage renal disease: Secondary | ICD-10-CM | POA: Diagnosis not present

## 2017-01-07 DIAGNOSIS — E119 Type 2 diabetes mellitus without complications: Secondary | ICD-10-CM | POA: Diagnosis not present

## 2017-01-07 DIAGNOSIS — D509 Iron deficiency anemia, unspecified: Secondary | ICD-10-CM | POA: Diagnosis not present

## 2017-01-07 DIAGNOSIS — N2581 Secondary hyperparathyroidism of renal origin: Secondary | ICD-10-CM | POA: Diagnosis not present

## 2017-01-07 DIAGNOSIS — D631 Anemia in chronic kidney disease: Secondary | ICD-10-CM | POA: Diagnosis not present

## 2017-01-08 ENCOUNTER — Encounter: Payer: Self-pay | Admitting: Podiatry

## 2017-01-08 ENCOUNTER — Ambulatory Visit (INDEPENDENT_AMBULATORY_CARE_PROVIDER_SITE_OTHER): Payer: Medicare Other | Admitting: Podiatry

## 2017-01-08 DIAGNOSIS — M2010 Hallux valgus (acquired), unspecified foot: Secondary | ICD-10-CM

## 2017-01-08 DIAGNOSIS — M79676 Pain in unspecified toe(s): Secondary | ICD-10-CM

## 2017-01-08 DIAGNOSIS — B351 Tinea unguium: Secondary | ICD-10-CM | POA: Diagnosis not present

## 2017-01-08 DIAGNOSIS — E1140 Type 2 diabetes mellitus with diabetic neuropathy, unspecified: Secondary | ICD-10-CM

## 2017-01-08 DIAGNOSIS — M2040 Other hammer toe(s) (acquired), unspecified foot: Secondary | ICD-10-CM

## 2017-01-08 NOTE — Progress Notes (Signed)
Patient ID: Shawn [name], male   DOB: 1961/04/03, 56 y.o.   MRN: [id number] Complaint:  Visit Type: Patient returns to my office for continued preventative foot care services. Complaint: Patient states" my nails have grown long and thick and become painful to walk and wear shoes" Patient has been diagnosed with DM with neuropathy and angiopathy.. The patient presents for preventative foot care services. No changes to ROS  Podiatric Exam: Vascular: dorsalis pedis and posterior tibial pulses are not  palpable bilateral. Capillary return is immediate. Cold feet  B/L Skin turgor WNL  Sensorium: Diminished Semmes Weinstein monofilament test. Normal tactile sensation bilaterally. Nail Exam: Pt has thick disfigured discolored nails with subungual debris noted bilateral entire nail hallux through fifth toenails Ulcer Exam: There is no evidence of ulcer or pre-ulcerative changes or infection. Orthopedic Exam: Muscle tone and strength are WNL. No limitations in general ROM. No crepitus or effusions noted. Foot type and digits show no abnormalities. HAV B/L  Hammer toe 2  B/L Skin: No Porokeratosis. No infection or ulcers.    No signs of redness or infection or swelling.  Diagnosis:  Onychomycosis, , Pain in right toe, pain in left toes  Treatment & Plan Procedures and Treatment: Consent by patient was obtained for treatment procedures. The patient understood the discussion of treatment and procedures well. All questions were answered thoroughly reviewed. Debridement of mycotic and hypertrophic toenails, 1 through 5 bilateral and clearing of subungual debris. No ulceration, no infection noted. Initiate diabetic shoe paperwork with HAV and HT. Patient was given diabetic shoe paperwork to hand deliver to his medical doctor. Return Visit-Office Procedure: Patient instructed to return to the office for a follow up visit 3 months for continued evaluation and treatment.  Gardiner Barefoot DPM

## 2017-01-09 DIAGNOSIS — D509 Iron deficiency anemia, unspecified: Secondary | ICD-10-CM | POA: Diagnosis not present

## 2017-01-09 DIAGNOSIS — E119 Type 2 diabetes mellitus without complications: Secondary | ICD-10-CM | POA: Diagnosis not present

## 2017-01-09 DIAGNOSIS — D631 Anemia in chronic kidney disease: Secondary | ICD-10-CM | POA: Diagnosis not present

## 2017-01-09 DIAGNOSIS — N2581 Secondary hyperparathyroidism of renal origin: Secondary | ICD-10-CM | POA: Diagnosis not present

## 2017-01-09 DIAGNOSIS — N186 End stage renal disease: Secondary | ICD-10-CM | POA: Diagnosis not present

## 2017-01-11 DIAGNOSIS — D631 Anemia in chronic kidney disease: Secondary | ICD-10-CM | POA: Diagnosis not present

## 2017-01-11 DIAGNOSIS — D509 Iron deficiency anemia, unspecified: Secondary | ICD-10-CM | POA: Diagnosis not present

## 2017-01-11 DIAGNOSIS — N2581 Secondary hyperparathyroidism of renal origin: Secondary | ICD-10-CM | POA: Diagnosis not present

## 2017-01-11 DIAGNOSIS — E119 Type 2 diabetes mellitus without complications: Secondary | ICD-10-CM | POA: Diagnosis not present

## 2017-01-11 DIAGNOSIS — N186 End stage renal disease: Secondary | ICD-10-CM | POA: Diagnosis not present

## 2017-01-14 DIAGNOSIS — D631 Anemia in chronic kidney disease: Secondary | ICD-10-CM | POA: Diagnosis not present

## 2017-01-14 DIAGNOSIS — N2581 Secondary hyperparathyroidism of renal origin: Secondary | ICD-10-CM | POA: Diagnosis not present

## 2017-01-14 DIAGNOSIS — E119 Type 2 diabetes mellitus without complications: Secondary | ICD-10-CM | POA: Diagnosis not present

## 2017-01-14 DIAGNOSIS — D509 Iron deficiency anemia, unspecified: Secondary | ICD-10-CM | POA: Diagnosis not present

## 2017-01-14 DIAGNOSIS — N186 End stage renal disease: Secondary | ICD-10-CM | POA: Diagnosis not present

## 2017-01-16 DIAGNOSIS — D631 Anemia in chronic kidney disease: Secondary | ICD-10-CM | POA: Diagnosis not present

## 2017-01-16 DIAGNOSIS — D509 Iron deficiency anemia, unspecified: Secondary | ICD-10-CM | POA: Diagnosis not present

## 2017-01-16 DIAGNOSIS — E119 Type 2 diabetes mellitus without complications: Secondary | ICD-10-CM | POA: Diagnosis not present

## 2017-01-16 DIAGNOSIS — N2581 Secondary hyperparathyroidism of renal origin: Secondary | ICD-10-CM | POA: Diagnosis not present

## 2017-01-16 DIAGNOSIS — N186 End stage renal disease: Secondary | ICD-10-CM | POA: Diagnosis not present

## 2017-01-18 DIAGNOSIS — N186 End stage renal disease: Secondary | ICD-10-CM | POA: Diagnosis not present

## 2017-01-18 DIAGNOSIS — E119 Type 2 diabetes mellitus without complications: Secondary | ICD-10-CM | POA: Diagnosis not present

## 2017-01-18 DIAGNOSIS — D631 Anemia in chronic kidney disease: Secondary | ICD-10-CM | POA: Diagnosis not present

## 2017-01-18 DIAGNOSIS — N2581 Secondary hyperparathyroidism of renal origin: Secondary | ICD-10-CM | POA: Diagnosis not present

## 2017-01-18 DIAGNOSIS — D509 Iron deficiency anemia, unspecified: Secondary | ICD-10-CM | POA: Diagnosis not present

## 2017-01-21 DIAGNOSIS — N2581 Secondary hyperparathyroidism of renal origin: Secondary | ICD-10-CM | POA: Diagnosis not present

## 2017-01-21 DIAGNOSIS — N186 End stage renal disease: Secondary | ICD-10-CM | POA: Diagnosis not present

## 2017-01-21 DIAGNOSIS — D631 Anemia in chronic kidney disease: Secondary | ICD-10-CM | POA: Diagnosis not present

## 2017-01-21 DIAGNOSIS — D509 Iron deficiency anemia, unspecified: Secondary | ICD-10-CM | POA: Diagnosis not present

## 2017-01-21 DIAGNOSIS — E119 Type 2 diabetes mellitus without complications: Secondary | ICD-10-CM | POA: Diagnosis not present

## 2017-01-23 DIAGNOSIS — E119 Type 2 diabetes mellitus without complications: Secondary | ICD-10-CM | POA: Diagnosis not present

## 2017-01-23 DIAGNOSIS — N186 End stage renal disease: Secondary | ICD-10-CM | POA: Diagnosis not present

## 2017-01-23 DIAGNOSIS — D631 Anemia in chronic kidney disease: Secondary | ICD-10-CM | POA: Diagnosis not present

## 2017-01-23 DIAGNOSIS — N2581 Secondary hyperparathyroidism of renal origin: Secondary | ICD-10-CM | POA: Diagnosis not present

## 2017-01-23 DIAGNOSIS — D509 Iron deficiency anemia, unspecified: Secondary | ICD-10-CM | POA: Diagnosis not present

## 2017-01-25 DIAGNOSIS — N186 End stage renal disease: Secondary | ICD-10-CM | POA: Diagnosis not present

## 2017-01-25 DIAGNOSIS — E119 Type 2 diabetes mellitus without complications: Secondary | ICD-10-CM | POA: Diagnosis not present

## 2017-01-25 DIAGNOSIS — N2581 Secondary hyperparathyroidism of renal origin: Secondary | ICD-10-CM | POA: Diagnosis not present

## 2017-01-25 DIAGNOSIS — D631 Anemia in chronic kidney disease: Secondary | ICD-10-CM | POA: Diagnosis not present

## 2017-01-25 DIAGNOSIS — D509 Iron deficiency anemia, unspecified: Secondary | ICD-10-CM | POA: Diagnosis not present

## 2017-01-28 DIAGNOSIS — D509 Iron deficiency anemia, unspecified: Secondary | ICD-10-CM | POA: Diagnosis not present

## 2017-01-28 DIAGNOSIS — E119 Type 2 diabetes mellitus without complications: Secondary | ICD-10-CM | POA: Diagnosis not present

## 2017-01-28 DIAGNOSIS — N2581 Secondary hyperparathyroidism of renal origin: Secondary | ICD-10-CM | POA: Diagnosis not present

## 2017-01-28 DIAGNOSIS — N186 End stage renal disease: Secondary | ICD-10-CM | POA: Diagnosis not present

## 2017-01-28 DIAGNOSIS — D631 Anemia in chronic kidney disease: Secondary | ICD-10-CM | POA: Diagnosis not present

## 2017-01-30 DIAGNOSIS — D509 Iron deficiency anemia, unspecified: Secondary | ICD-10-CM | POA: Diagnosis not present

## 2017-01-30 DIAGNOSIS — D631 Anemia in chronic kidney disease: Secondary | ICD-10-CM | POA: Diagnosis not present

## 2017-01-30 DIAGNOSIS — E119 Type 2 diabetes mellitus without complications: Secondary | ICD-10-CM | POA: Diagnosis not present

## 2017-01-30 DIAGNOSIS — E1129 Type 2 diabetes mellitus with other diabetic kidney complication: Secondary | ICD-10-CM | POA: Diagnosis not present

## 2017-01-30 DIAGNOSIS — N2581 Secondary hyperparathyroidism of renal origin: Secondary | ICD-10-CM | POA: Diagnosis not present

## 2017-01-30 DIAGNOSIS — N186 End stage renal disease: Secondary | ICD-10-CM | POA: Diagnosis not present

## 2017-01-31 DIAGNOSIS — E7800 Pure hypercholesterolemia, unspecified: Secondary | ICD-10-CM | POA: Diagnosis not present

## 2017-01-31 DIAGNOSIS — E11319 Type 2 diabetes mellitus with unspecified diabetic retinopathy without macular edema: Secondary | ICD-10-CM | POA: Diagnosis not present

## 2017-01-31 DIAGNOSIS — I6990 Unspecified sequelae of unspecified cerebrovascular disease: Secondary | ICD-10-CM | POA: Diagnosis not present

## 2017-01-31 DIAGNOSIS — Z79899 Other long term (current) drug therapy: Secondary | ICD-10-CM | POA: Diagnosis not present

## 2017-01-31 DIAGNOSIS — E559 Vitamin D deficiency, unspecified: Secondary | ICD-10-CM | POA: Diagnosis not present

## 2017-01-31 DIAGNOSIS — I119 Hypertensive heart disease without heart failure: Secondary | ICD-10-CM | POA: Diagnosis not present

## 2017-01-31 DIAGNOSIS — E669 Obesity, unspecified: Secondary | ICD-10-CM | POA: Diagnosis not present

## 2017-01-31 DIAGNOSIS — E1165 Type 2 diabetes mellitus with hyperglycemia: Secondary | ICD-10-CM | POA: Diagnosis not present

## 2017-01-31 DIAGNOSIS — H548 Legal blindness, as defined in USA: Secondary | ICD-10-CM | POA: Diagnosis not present

## 2017-01-31 DIAGNOSIS — E1140 Type 2 diabetes mellitus with diabetic neuropathy, unspecified: Secondary | ICD-10-CM | POA: Diagnosis not present

## 2017-01-31 DIAGNOSIS — E113592 Type 2 diabetes mellitus with proliferative diabetic retinopathy without macular edema, left eye: Secondary | ICD-10-CM | POA: Diagnosis not present

## 2017-01-31 DIAGNOSIS — I2510 Atherosclerotic heart disease of native coronary artery without angina pectoris: Secondary | ICD-10-CM | POA: Diagnosis not present

## 2017-02-01 DIAGNOSIS — D631 Anemia in chronic kidney disease: Secondary | ICD-10-CM | POA: Diagnosis not present

## 2017-02-01 DIAGNOSIS — N186 End stage renal disease: Secondary | ICD-10-CM | POA: Diagnosis not present

## 2017-02-01 DIAGNOSIS — N2581 Secondary hyperparathyroidism of renal origin: Secondary | ICD-10-CM | POA: Diagnosis not present

## 2017-02-01 DIAGNOSIS — E119 Type 2 diabetes mellitus without complications: Secondary | ICD-10-CM | POA: Diagnosis not present

## 2017-02-01 DIAGNOSIS — D509 Iron deficiency anemia, unspecified: Secondary | ICD-10-CM | POA: Diagnosis not present

## 2017-02-04 DIAGNOSIS — N186 End stage renal disease: Secondary | ICD-10-CM | POA: Diagnosis not present

## 2017-02-04 DIAGNOSIS — E119 Type 2 diabetes mellitus without complications: Secondary | ICD-10-CM | POA: Diagnosis not present

## 2017-02-04 DIAGNOSIS — D631 Anemia in chronic kidney disease: Secondary | ICD-10-CM | POA: Diagnosis not present

## 2017-02-04 DIAGNOSIS — D509 Iron deficiency anemia, unspecified: Secondary | ICD-10-CM | POA: Diagnosis not present

## 2017-02-04 DIAGNOSIS — N2581 Secondary hyperparathyroidism of renal origin: Secondary | ICD-10-CM | POA: Diagnosis not present

## 2017-02-05 DIAGNOSIS — I120 Hypertensive chronic kidney disease with stage 5 chronic kidney disease or end stage renal disease: Secondary | ICD-10-CM | POA: Diagnosis not present

## 2017-02-05 DIAGNOSIS — Z992 Dependence on renal dialysis: Secondary | ICD-10-CM | POA: Diagnosis not present

## 2017-02-05 DIAGNOSIS — N186 End stage renal disease: Secondary | ICD-10-CM | POA: Diagnosis not present

## 2017-02-06 DIAGNOSIS — D631 Anemia in chronic kidney disease: Secondary | ICD-10-CM | POA: Diagnosis not present

## 2017-02-06 DIAGNOSIS — E119 Type 2 diabetes mellitus without complications: Secondary | ICD-10-CM | POA: Diagnosis not present

## 2017-02-06 DIAGNOSIS — N186 End stage renal disease: Secondary | ICD-10-CM | POA: Diagnosis not present

## 2017-02-06 DIAGNOSIS — Z23 Encounter for immunization: Secondary | ICD-10-CM | POA: Diagnosis not present

## 2017-02-06 DIAGNOSIS — D509 Iron deficiency anemia, unspecified: Secondary | ICD-10-CM | POA: Diagnosis not present

## 2017-02-06 DIAGNOSIS — N2581 Secondary hyperparathyroidism of renal origin: Secondary | ICD-10-CM | POA: Diagnosis not present

## 2017-02-07 DIAGNOSIS — N186 End stage renal disease: Secondary | ICD-10-CM | POA: Diagnosis not present

## 2017-02-07 DIAGNOSIS — D509 Iron deficiency anemia, unspecified: Secondary | ICD-10-CM | POA: Diagnosis not present

## 2017-02-07 DIAGNOSIS — N2581 Secondary hyperparathyroidism of renal origin: Secondary | ICD-10-CM | POA: Diagnosis not present

## 2017-02-07 DIAGNOSIS — E119 Type 2 diabetes mellitus without complications: Secondary | ICD-10-CM | POA: Diagnosis not present

## 2017-02-07 DIAGNOSIS — Z23 Encounter for immunization: Secondary | ICD-10-CM | POA: Diagnosis not present

## 2017-02-07 DIAGNOSIS — D631 Anemia in chronic kidney disease: Secondary | ICD-10-CM | POA: Diagnosis not present

## 2017-02-11 DIAGNOSIS — Z23 Encounter for immunization: Secondary | ICD-10-CM | POA: Diagnosis not present

## 2017-02-11 DIAGNOSIS — E119 Type 2 diabetes mellitus without complications: Secondary | ICD-10-CM | POA: Diagnosis not present

## 2017-02-11 DIAGNOSIS — D631 Anemia in chronic kidney disease: Secondary | ICD-10-CM | POA: Diagnosis not present

## 2017-02-11 DIAGNOSIS — D509 Iron deficiency anemia, unspecified: Secondary | ICD-10-CM | POA: Diagnosis not present

## 2017-02-11 DIAGNOSIS — N2581 Secondary hyperparathyroidism of renal origin: Secondary | ICD-10-CM | POA: Diagnosis not present

## 2017-02-11 DIAGNOSIS — N186 End stage renal disease: Secondary | ICD-10-CM | POA: Diagnosis not present

## 2017-02-13 DIAGNOSIS — N2581 Secondary hyperparathyroidism of renal origin: Secondary | ICD-10-CM | POA: Diagnosis not present

## 2017-02-13 DIAGNOSIS — E119 Type 2 diabetes mellitus without complications: Secondary | ICD-10-CM | POA: Diagnosis not present

## 2017-02-13 DIAGNOSIS — D631 Anemia in chronic kidney disease: Secondary | ICD-10-CM | POA: Diagnosis not present

## 2017-02-13 DIAGNOSIS — D509 Iron deficiency anemia, unspecified: Secondary | ICD-10-CM | POA: Diagnosis not present

## 2017-02-13 DIAGNOSIS — Z23 Encounter for immunization: Secondary | ICD-10-CM | POA: Diagnosis not present

## 2017-02-13 DIAGNOSIS — N186 End stage renal disease: Secondary | ICD-10-CM | POA: Diagnosis not present

## 2017-02-15 DIAGNOSIS — D631 Anemia in chronic kidney disease: Secondary | ICD-10-CM | POA: Diagnosis not present

## 2017-02-15 DIAGNOSIS — E119 Type 2 diabetes mellitus without complications: Secondary | ICD-10-CM | POA: Diagnosis not present

## 2017-02-15 DIAGNOSIS — N186 End stage renal disease: Secondary | ICD-10-CM | POA: Diagnosis not present

## 2017-02-15 DIAGNOSIS — N2581 Secondary hyperparathyroidism of renal origin: Secondary | ICD-10-CM | POA: Diagnosis not present

## 2017-02-15 DIAGNOSIS — Z23 Encounter for immunization: Secondary | ICD-10-CM | POA: Diagnosis not present

## 2017-02-15 DIAGNOSIS — D509 Iron deficiency anemia, unspecified: Secondary | ICD-10-CM | POA: Diagnosis not present

## 2017-02-18 DIAGNOSIS — E119 Type 2 diabetes mellitus without complications: Secondary | ICD-10-CM | POA: Diagnosis not present

## 2017-02-18 DIAGNOSIS — N186 End stage renal disease: Secondary | ICD-10-CM | POA: Diagnosis not present

## 2017-02-18 DIAGNOSIS — D631 Anemia in chronic kidney disease: Secondary | ICD-10-CM | POA: Diagnosis not present

## 2017-02-18 DIAGNOSIS — D509 Iron deficiency anemia, unspecified: Secondary | ICD-10-CM | POA: Diagnosis not present

## 2017-02-18 DIAGNOSIS — Z23 Encounter for immunization: Secondary | ICD-10-CM | POA: Diagnosis not present

## 2017-02-18 DIAGNOSIS — N2581 Secondary hyperparathyroidism of renal origin: Secondary | ICD-10-CM | POA: Diagnosis not present

## 2017-02-20 DIAGNOSIS — N2581 Secondary hyperparathyroidism of renal origin: Secondary | ICD-10-CM | POA: Diagnosis not present

## 2017-02-20 DIAGNOSIS — D631 Anemia in chronic kidney disease: Secondary | ICD-10-CM | POA: Diagnosis not present

## 2017-02-20 DIAGNOSIS — N186 End stage renal disease: Secondary | ICD-10-CM | POA: Diagnosis not present

## 2017-02-20 DIAGNOSIS — Z23 Encounter for immunization: Secondary | ICD-10-CM | POA: Diagnosis not present

## 2017-02-20 DIAGNOSIS — E119 Type 2 diabetes mellitus without complications: Secondary | ICD-10-CM | POA: Diagnosis not present

## 2017-02-20 DIAGNOSIS — D509 Iron deficiency anemia, unspecified: Secondary | ICD-10-CM | POA: Diagnosis not present

## 2017-02-22 DIAGNOSIS — Z23 Encounter for immunization: Secondary | ICD-10-CM | POA: Diagnosis not present

## 2017-02-22 DIAGNOSIS — D509 Iron deficiency anemia, unspecified: Secondary | ICD-10-CM | POA: Diagnosis not present

## 2017-02-22 DIAGNOSIS — E119 Type 2 diabetes mellitus without complications: Secondary | ICD-10-CM | POA: Diagnosis not present

## 2017-02-22 DIAGNOSIS — N2581 Secondary hyperparathyroidism of renal origin: Secondary | ICD-10-CM | POA: Diagnosis not present

## 2017-02-22 DIAGNOSIS — N186 End stage renal disease: Secondary | ICD-10-CM | POA: Diagnosis not present

## 2017-02-22 DIAGNOSIS — D631 Anemia in chronic kidney disease: Secondary | ICD-10-CM | POA: Diagnosis not present

## 2017-02-25 DIAGNOSIS — D509 Iron deficiency anemia, unspecified: Secondary | ICD-10-CM | POA: Diagnosis not present

## 2017-02-25 DIAGNOSIS — D631 Anemia in chronic kidney disease: Secondary | ICD-10-CM | POA: Diagnosis not present

## 2017-02-25 DIAGNOSIS — Z23 Encounter for immunization: Secondary | ICD-10-CM | POA: Diagnosis not present

## 2017-02-25 DIAGNOSIS — N2581 Secondary hyperparathyroidism of renal origin: Secondary | ICD-10-CM | POA: Diagnosis not present

## 2017-02-25 DIAGNOSIS — E119 Type 2 diabetes mellitus without complications: Secondary | ICD-10-CM | POA: Diagnosis not present

## 2017-02-25 DIAGNOSIS — N186 End stage renal disease: Secondary | ICD-10-CM | POA: Diagnosis not present

## 2017-02-27 DIAGNOSIS — Z23 Encounter for immunization: Secondary | ICD-10-CM | POA: Diagnosis not present

## 2017-02-27 DIAGNOSIS — E119 Type 2 diabetes mellitus without complications: Secondary | ICD-10-CM | POA: Diagnosis not present

## 2017-02-27 DIAGNOSIS — D631 Anemia in chronic kidney disease: Secondary | ICD-10-CM | POA: Diagnosis not present

## 2017-02-27 DIAGNOSIS — D509 Iron deficiency anemia, unspecified: Secondary | ICD-10-CM | POA: Diagnosis not present

## 2017-02-27 DIAGNOSIS — N186 End stage renal disease: Secondary | ICD-10-CM | POA: Diagnosis not present

## 2017-02-27 DIAGNOSIS — N2581 Secondary hyperparathyroidism of renal origin: Secondary | ICD-10-CM | POA: Diagnosis not present

## 2017-03-01 DIAGNOSIS — D509 Iron deficiency anemia, unspecified: Secondary | ICD-10-CM | POA: Diagnosis not present

## 2017-03-01 DIAGNOSIS — N2581 Secondary hyperparathyroidism of renal origin: Secondary | ICD-10-CM | POA: Diagnosis not present

## 2017-03-01 DIAGNOSIS — E119 Type 2 diabetes mellitus without complications: Secondary | ICD-10-CM | POA: Diagnosis not present

## 2017-03-01 DIAGNOSIS — D631 Anemia in chronic kidney disease: Secondary | ICD-10-CM | POA: Diagnosis not present

## 2017-03-01 DIAGNOSIS — N186 End stage renal disease: Secondary | ICD-10-CM | POA: Diagnosis not present

## 2017-03-01 DIAGNOSIS — Z23 Encounter for immunization: Secondary | ICD-10-CM | POA: Diagnosis not present

## 2017-03-04 DIAGNOSIS — Z23 Encounter for immunization: Secondary | ICD-10-CM | POA: Diagnosis not present

## 2017-03-04 DIAGNOSIS — N2581 Secondary hyperparathyroidism of renal origin: Secondary | ICD-10-CM | POA: Diagnosis not present

## 2017-03-04 DIAGNOSIS — D631 Anemia in chronic kidney disease: Secondary | ICD-10-CM | POA: Diagnosis not present

## 2017-03-04 DIAGNOSIS — E119 Type 2 diabetes mellitus without complications: Secondary | ICD-10-CM | POA: Diagnosis not present

## 2017-03-04 DIAGNOSIS — D509 Iron deficiency anemia, unspecified: Secondary | ICD-10-CM | POA: Diagnosis not present

## 2017-03-04 DIAGNOSIS — N186 End stage renal disease: Secondary | ICD-10-CM | POA: Diagnosis not present

## 2017-03-06 DIAGNOSIS — D509 Iron deficiency anemia, unspecified: Secondary | ICD-10-CM | POA: Diagnosis not present

## 2017-03-06 DIAGNOSIS — D631 Anemia in chronic kidney disease: Secondary | ICD-10-CM | POA: Diagnosis not present

## 2017-03-06 DIAGNOSIS — N2581 Secondary hyperparathyroidism of renal origin: Secondary | ICD-10-CM | POA: Diagnosis not present

## 2017-03-06 DIAGNOSIS — Z23 Encounter for immunization: Secondary | ICD-10-CM | POA: Diagnosis not present

## 2017-03-06 DIAGNOSIS — N186 End stage renal disease: Secondary | ICD-10-CM | POA: Diagnosis not present

## 2017-03-06 DIAGNOSIS — E119 Type 2 diabetes mellitus without complications: Secondary | ICD-10-CM | POA: Diagnosis not present

## 2017-03-07 ENCOUNTER — Ambulatory Visit: Payer: Medicare Other | Admitting: Neurology

## 2017-03-08 DIAGNOSIS — Z23 Encounter for immunization: Secondary | ICD-10-CM | POA: Diagnosis not present

## 2017-03-08 DIAGNOSIS — N2581 Secondary hyperparathyroidism of renal origin: Secondary | ICD-10-CM | POA: Diagnosis not present

## 2017-03-08 DIAGNOSIS — I120 Hypertensive chronic kidney disease with stage 5 chronic kidney disease or end stage renal disease: Secondary | ICD-10-CM | POA: Diagnosis not present

## 2017-03-08 DIAGNOSIS — D509 Iron deficiency anemia, unspecified: Secondary | ICD-10-CM | POA: Diagnosis not present

## 2017-03-08 DIAGNOSIS — E119 Type 2 diabetes mellitus without complications: Secondary | ICD-10-CM | POA: Diagnosis not present

## 2017-03-08 DIAGNOSIS — N186 End stage renal disease: Secondary | ICD-10-CM | POA: Diagnosis not present

## 2017-03-08 DIAGNOSIS — D631 Anemia in chronic kidney disease: Secondary | ICD-10-CM | POA: Diagnosis not present

## 2017-03-08 DIAGNOSIS — Z992 Dependence on renal dialysis: Secondary | ICD-10-CM | POA: Diagnosis not present

## 2017-03-11 DIAGNOSIS — N2581 Secondary hyperparathyroidism of renal origin: Secondary | ICD-10-CM | POA: Diagnosis not present

## 2017-03-11 DIAGNOSIS — E119 Type 2 diabetes mellitus without complications: Secondary | ICD-10-CM | POA: Diagnosis not present

## 2017-03-11 DIAGNOSIS — N186 End stage renal disease: Secondary | ICD-10-CM | POA: Diagnosis not present

## 2017-03-11 DIAGNOSIS — D509 Iron deficiency anemia, unspecified: Secondary | ICD-10-CM | POA: Diagnosis not present

## 2017-03-11 DIAGNOSIS — D631 Anemia in chronic kidney disease: Secondary | ICD-10-CM | POA: Diagnosis not present

## 2017-03-13 DIAGNOSIS — N2581 Secondary hyperparathyroidism of renal origin: Secondary | ICD-10-CM | POA: Diagnosis not present

## 2017-03-13 DIAGNOSIS — N186 End stage renal disease: Secondary | ICD-10-CM | POA: Diagnosis not present

## 2017-03-13 DIAGNOSIS — E119 Type 2 diabetes mellitus without complications: Secondary | ICD-10-CM | POA: Diagnosis not present

## 2017-03-13 DIAGNOSIS — D509 Iron deficiency anemia, unspecified: Secondary | ICD-10-CM | POA: Diagnosis not present

## 2017-03-13 DIAGNOSIS — D631 Anemia in chronic kidney disease: Secondary | ICD-10-CM | POA: Diagnosis not present

## 2017-03-15 DIAGNOSIS — D631 Anemia in chronic kidney disease: Secondary | ICD-10-CM | POA: Diagnosis not present

## 2017-03-15 DIAGNOSIS — D509 Iron deficiency anemia, unspecified: Secondary | ICD-10-CM | POA: Diagnosis not present

## 2017-03-15 DIAGNOSIS — E119 Type 2 diabetes mellitus without complications: Secondary | ICD-10-CM | POA: Diagnosis not present

## 2017-03-15 DIAGNOSIS — N2581 Secondary hyperparathyroidism of renal origin: Secondary | ICD-10-CM | POA: Diagnosis not present

## 2017-03-15 DIAGNOSIS — N186 End stage renal disease: Secondary | ICD-10-CM | POA: Diagnosis not present

## 2017-03-18 DIAGNOSIS — D631 Anemia in chronic kidney disease: Secondary | ICD-10-CM | POA: Diagnosis not present

## 2017-03-18 DIAGNOSIS — E119 Type 2 diabetes mellitus without complications: Secondary | ICD-10-CM | POA: Diagnosis not present

## 2017-03-18 DIAGNOSIS — D509 Iron deficiency anemia, unspecified: Secondary | ICD-10-CM | POA: Diagnosis not present

## 2017-03-18 DIAGNOSIS — N186 End stage renal disease: Secondary | ICD-10-CM | POA: Diagnosis not present

## 2017-03-18 DIAGNOSIS — N2581 Secondary hyperparathyroidism of renal origin: Secondary | ICD-10-CM | POA: Diagnosis not present

## 2017-03-20 DIAGNOSIS — N2581 Secondary hyperparathyroidism of renal origin: Secondary | ICD-10-CM | POA: Diagnosis not present

## 2017-03-20 DIAGNOSIS — E119 Type 2 diabetes mellitus without complications: Secondary | ICD-10-CM | POA: Diagnosis not present

## 2017-03-20 DIAGNOSIS — D631 Anemia in chronic kidney disease: Secondary | ICD-10-CM | POA: Diagnosis not present

## 2017-03-20 DIAGNOSIS — N186 End stage renal disease: Secondary | ICD-10-CM | POA: Diagnosis not present

## 2017-03-20 DIAGNOSIS — D509 Iron deficiency anemia, unspecified: Secondary | ICD-10-CM | POA: Diagnosis not present

## 2017-03-21 ENCOUNTER — Encounter: Payer: Self-pay | Admitting: Neurology

## 2017-03-21 ENCOUNTER — Ambulatory Visit (INDEPENDENT_AMBULATORY_CARE_PROVIDER_SITE_OTHER): Payer: Medicare Other | Admitting: Neurology

## 2017-03-21 VITALS — BP 154/85 | HR 82 | Wt 217.2 lb

## 2017-03-21 DIAGNOSIS — I6990 Unspecified sequelae of unspecified cerebrovascular disease: Secondary | ICD-10-CM

## 2017-03-21 DIAGNOSIS — I6523 Occlusion and stenosis of bilateral carotid arteries: Secondary | ICD-10-CM | POA: Diagnosis not present

## 2017-03-21 NOTE — Progress Notes (Signed)
[facility]  PATIENT: Shawn [name] DOB: 02/19/1961   REASON FOR VISIT: Follow-up for history of stroke, end-stage renal disease,  intracranial atherosclerosis HISTORY FROM: Patient alone at visit   HISTORY OF PRESENT ILLNESS:2/2/17Mr. [name], 56 year old male returns for followup. He has history of stroke which occurred in April 2012 he is currently on aspirin 325 without further stroke or TIA symptoms. He goes to dialysis 3 times a week. He had a revision of his AV shunt 01/18/15 but now has portacath in right chest. 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 He denies any recent falls , he ambulates with a single-point cane. He returns for reevaluation. He has vascular risk factors of  Diabetes and hyperlipidemia. His last hemoglobin A1c was 8.1 and he's had adjustments to his insulin, but he tells me today that his insulin is so expensive that there are days that he does not take the medication. He says he has told his primary care this. Last carotid Doppler was negative for significant stenosis and transcranial Doppler showed elevated velocities in the left middle cerebral artery suggestive of mild stenosis, globally elevated pulsatility indexes suggestive of diffuse intracranial atherosclerosis. His blood pressure 111/72 in the office this morning, he has dizziness when he does not drink enough fluids. 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 The patient presented with gait instability, vertigo and dizziness, which have resolved completely, except the patient still has some gait instability. But he can walk pretty well with a cane. 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 The patient had cataract surgery and retinal repair on both of his eyes before  the stroke. The patient has a PMH of end stage renal disease, and goes to dialysis 3x a week.  He Is seen urgently today following recent visit to [facility] emergency room on 03/04/12 for dizziness. He woke up that day feeling dizzy and off-balance and with a sensation of body being pulled to the right side. This was noticed mainly when he was in the upright position and he felt better when he sat down or lay down. He denied any vertigo, nausea, diplopia. He did have some minor tingling in his right hand. His blood pressure was recorded as 133/85 in the ER but they did not check orthostatics. CBC and BMP were unremarkable. Patient states he had similar but milder episode today's later following dialysis when his blood pressure was recorded being low in the 80s. Similarly symptoms were noticeable when he was upright and improved when he lays flat. He complains of difficulty walking and exercising particularly on the days he gets dialysis as he feels quite tired. He had lipid profile checked last month by Dr. Katherine Roan and LDL was elevated but he cannot give me the exact numbers and I do not have those results. He continues to have poor vision annd uses a cane to walk.  Update 08/10/2014 : He returns for follow-up after last visit on 09/29/13. He continues to do well from neurovascular standpoint without recurrent stroke or TIA symptoms. He is tolerating aspirin without significant bleeding or bruising. He had lipid profile checked 3 months ago by his primary physician and it was fine. His blood pressure usually runs quite low blood is low in office today.  His last hemoglobin A1c was 6.8 done a few months ago. He had follow-up carotid ultrasound done on 10/07/13 which I personally reviewed showed no significant extra: Stenosis. Transcranial Doppler studies showed elevated velocities in the left middle cerebral artery suggestive of mild stenosis. Globally elevated pulsatility indexes and suggested diffuse intracranial  atherosclerosis. He continues to have poor vision in his eyes from diabetic retinopathy and uses a cane to ambulate.   Update 03/08/2016 :  He returns for follow-up after last visit a year and a half ago. Patient continues to be legally blind and uses a cane and states is careful and is on no major falls or injuries. She remains on aspirin as tolerated well without bleeding or bruising. He's had no further recurrent stroke or TIA symptoms. He does have a lot of dizziness and lightheadedness. He in fact was hospitalized last year with a syncopal episode following dialysis. His blood pressure continues to run low. He has been started on ProAmatine which she takes only when necessary if his systolic blood pressures below 90. He states his sugars are doing well. His primary physician managing his diabetes and is currently on insulin. He states he remains on Lipitor and WelChol but his cholesterol yet is not adequately controlled. He has no new neurological complaints. Update 03/21/2017 : He returns for follow-up after last visit a year ago. He continues to do well from neurovascular standpoint without recurrent stroke or TIA symptoms. He is starting aspirin well without bruising or bleeding. Remains on Lipitor but states his cholesterol is not well controlled but cannot tell me when it was last checked. He states his sugars have been controlled as long as he takes his medicines but he cannot afford the co-pay every month. His last hemoglobin A1c was 7.5. He continues to get dialysis 3 days a week. He is careful with his walking with his cane. He has fortunately had no falls or major injuries. His blood pressure slightly elevated today at 154/85. He has no new neurological complaints. He did not undergo follow-up carotids and transcranial Doppler studies which I had requested at last visit for unclear reasons.  REVIEW OF SYSTEMS: Full 14 system review of systems performed and notable only for those listed, all others  are neg:   Vision loss, gait difficulty, throbbing pulsation in the right temple intermittently and all the systems negative  ALLERGIES: No Known Allergies  HOME MEDICATIONS: Outpatient Medications Prior to Visit  Medication Sig Dispense Refill  . acetaminophen (TYLENOL) 325 MG tablet Take 650 mg by mouth every 6 (six) hours as needed (pain).    Marland Kitchen aspirin 325 MG tablet Take 325 mg by mouth daily.    Marland Kitchen atorvastatin (LIPITOR) 20 MG tablet Take 20 mg by mouth daily.    . B Complex Vitamins (B COMPLEX-B12) TABS Take 1 tablet by mouth daily.    . cinacalcet (SENSIPAR) 30 MG tablet Take 30 mg by mouth daily with supper.    . colesevelam (WELCHOL) 625 MG tablet Take 1,250 mg by mouth daily with supper.    . dorzolamide-timolol (COSOPT) 22.3-6.8 MG/ML ophthalmic solution Place 1 drop into the left eye 2 (two) times daily.    . insulin NPH-insulin regular (NOVOLIN 70/30) (70-30) 100 UNIT/ML injection Inject 15 Units into the skin 2 (two) times daily with a meal.    . lanthanum (FOSRENOL) 1000 MG chewable tablet Chew 1,000 mg by mouth 3 (three) times daily with meals.    . latanoprost (XALATAN) 0.005 % ophthalmic solution  Place 1 drop into the left eye at bedtime.    . midodrine (PROAMATINE) 10 MG tablet Take 10 mg by mouth as needed (takes before dialysis).     No facility-administered medications prior to visit.     PAST MEDICAL HISTORY: Past Medical History:  Diagnosis Date  . Anemia   . Arthritis    patient denies  . Blind right eye   . Chronic kidney disease    Dialysis since 2011 MWF  . Complication of anesthesia   . Diabetes mellitus    type II  . Hypertension   . Legally blind   . No pertinent past medical history   . PONV (postoperative nausea and vomiting)    N/V- became dehydrated had to have IV fluids- 01/2015  . Shortness of breath dyspnea    when has too fluid  . Stroke [facility]) 2012   date per patient  . Syncope   . Unspecified cerebral artery occlusion with cerebral  infarction 04/02/2013    PAST SURGICAL HISTORY: Past Surgical History:  Procedure Laterality Date  . AV FISTULA PLACEMENT Right 05/12/2013   Procedure: INSERTION OF ARTERIOVENOUS (AV) GORE-TEX GRAFT ARM; ULTRASOUND GUIDED;  Surgeon: Conrad Sipsey, MD;  Location: [place];  Service: Vascular;  Laterality: Right;  . AV FISTULA PLACEMENT Left 01/18/2015   Procedure: INSERTION OF ARTERIOVENOUS GORE-TEX GRAFT LEFT UPPER ARM;  Surgeon: Conrad Bellmont, MD;  Location: [place];  Service: Vascular;  Laterality: Left;  . COLONOSCOPY  06/2012  . EYE SURGERY Bilateral    retina surgery both eyes, cataract surgery both eyes  . HERNIA REPAIR     right inguinal  . INSERTION OF DIALYSIS CATHETER Right   . left arm graft  10/2010  . LIGATION ARTERIOVENOUS GORTEX GRAFT Left 05/03/2015   Procedure: LIGATION ARTERIOVENOUS GORTEX GRAFT-LEFT UPPER ARM;  Surgeon: Conrad Lowgap, MD;  Location: [facility] OR;  Service: Vascular;  Laterality: Left;    FAMILY HISTORY: Family History  Problem Relation Age of Onset  . Diabetes Mother   . Alzheimer's disease Mother   . Cancer Father   . Heart disease Father   . Cancer Sister   . Stroke Sister     SOCIAL HISTORY: Social History   Social History  . Marital status: Married    Spouse name: N/A  . Number of children: 0  . Years of education: 12th   Occupational History  .  Unemployed   Social History Main Topics  . Smoking status: Never Smoker  . Smokeless tobacco: Never Used  . Alcohol use No  . Drug use: No  . Sexual activity: Not on file   Other Topics Concern  . Not on file   Social History Narrative   Patient lives at home with wife.    Patient is disabled.    Patient has no children.    Patient has a 12 grade education.            PHYSICAL EXAM  Vitals:   03/21/17 1012  BP: (!) 154/85  Pulse: 82  Weight: 217 lb 3.2 oz (98.5 kg)   Body mass index is 30.29 kg/m. Generalized:  Frail middle aged african [place] male not in distress., in no acute  distress  Head: normocephalic and atraumatic,.   Neck: Supple, no carotid bruits  Cardiac: Regular rate rhythm, no murmur  Lungs clear to auscultation   Neurological examination   Mentation: Alert oriented to time, place, history taking. Follows all commands speech and language  fluent  Cranial nerve II-XII: Pupils are enlarged right greater than left and neither react. Conjugate eye movements are restricted on the left just moving medial and movement on the right, visual fields are restricted to confrontation on the left visual field testing central and medial vision field loss laterally. Right full vision loss Facial sensation and strength were normal. hearing was intact to finger rubbing bilaterally. Uvula tongue midline. head turning and shoulder shrug were normal and symmetric.Tongue protrusion into cheek strength was normal. Motor: normal bulk and tone, full strength in the BUE, BLE, fine finger movements normal, no pronator drift. No focal weakness Sensory: normal and symmetric to light touch, pinprick, and vibration  Coordination: Unable due to visual deficits Reflexes: 1+ upper and lower and symmetric Gait and Station: Wide-based gait with cane, unsteady with tandem uses a cane   DIAGNOSTIC DATA (LABS, IMAGING, TESTING) - I reviewed patient records, labs, notes, testing and imaging myself where available.  Lab Results  Component Value Date   WBC 7.3 01/23/2016   HGB 14.1 01/23/2016   HCT 44.5 01/23/2016   MCV 92.7 01/23/2016   PLT 240 01/23/2016      Component Value Date/Time   NA 133 (L) 01/23/2016 1151   K 4.5 01/23/2016 1151   CL 95 (L) 01/23/2016 1151   CO2 26 01/23/2016 1151   GLUCOSE 190 (H) 01/23/2016 1151   BUN 16 01/23/2016 1151   CREATININE 6.80 (H) 01/23/2016 1151   CALCIUM 8.6 (L) 01/23/2016 1151   CALCIUM 7.7 (L) 01/28/2010 1225   PROT 7.5 01/18/2015 1844   ALBUMIN 3.6 01/18/2015 1844   AST 18 01/18/2015 1844   ALT 14 (L) 01/18/2015 1844    ALKPHOS 113 01/18/2015 1844   BILITOT 0.7 01/18/2015 1844   GFRNONAA 8 (L) 01/23/2016 1151   GFRAA 10 (L) 01/23/2016 1151        ASSESSMENT AND PLAN 56 y.o. year old male has a past medical history of Stroke; Diabetes mellitus; Arthritis; Blind right eye; Unspecified cerebral artery occlusion with cerebral infarction (04/02/2013); Chronic kidney disease; Legally blind; and Anemia here to follow-up.  I had a long d/w patient about his remote stroke,intracranial stenosis risk for recurrent stroke/TIAs, personally independently reviewed imaging studies and stroke evaluation results and answered questions.Continue aspirin 325 mg daily  for secondary stroke prevention and maintain strict control of hypertension with blood pressure goal below 130/90, diabetes with hemoglobin A1c goal below 6.5% and lipids with LDL cholesterol goal below 70 mg/dL. I also advised the patient to eat a healthy diet with plenty of whole grains, cereals, fruits and vegetables, exercise regularly and maintain ideal body weight .I advised him to be careful with his walking due to his legal blindness and to use a cane at all times to avoid falls and injuries. Check follow-up carotid ultrasound and transcranial Doppler studies.   He seems to be stable from neurovascular stamp [place] without any recurrent symptoms of several years. No routine scheduled follow-up appointment with me is necessary but he may be referred back by primary physician in the future only as needed. Greater than 50% time during this 25 minute visit was spent on counseling and coordination of care about stroke and TIA risk, prevention and treatment discussion and answering questions.   Antony Contras, MD [facility] [address] [place],  [address] [phone]

## 2017-03-21 NOTE — Patient Instructions (Signed)
I had a long discussion with the patient with regards to his remote strokes and TIAs and seems to be stable from neurovascular standpoint now for several years. Continue aspirin for stroke prevention and Lipitor for hyperlipidemia with LDL cholesterol goal below 70 mg percent and strict control of diabetes with hemoglobin A1c goal below 6.5 and hypertension with blood pressure goal below 130/90. Check screening follow-up carotids and transcranial Doppler studies as he has not had these checked in several years. No routine scheduled follow-up appointment with me is necessary but he may be referred back by primary physician in the future only as needed.

## 2017-03-22 DIAGNOSIS — D631 Anemia in chronic kidney disease: Secondary | ICD-10-CM | POA: Diagnosis not present

## 2017-03-22 DIAGNOSIS — E119 Type 2 diabetes mellitus without complications: Secondary | ICD-10-CM | POA: Diagnosis not present

## 2017-03-22 DIAGNOSIS — N186 End stage renal disease: Secondary | ICD-10-CM | POA: Diagnosis not present

## 2017-03-22 DIAGNOSIS — D509 Iron deficiency anemia, unspecified: Secondary | ICD-10-CM | POA: Diagnosis not present

## 2017-03-22 DIAGNOSIS — N2581 Secondary hyperparathyroidism of renal origin: Secondary | ICD-10-CM | POA: Diagnosis not present

## 2017-03-25 DIAGNOSIS — N2581 Secondary hyperparathyroidism of renal origin: Secondary | ICD-10-CM | POA: Diagnosis not present

## 2017-03-25 DIAGNOSIS — E119 Type 2 diabetes mellitus without complications: Secondary | ICD-10-CM | POA: Diagnosis not present

## 2017-03-25 DIAGNOSIS — D631 Anemia in chronic kidney disease: Secondary | ICD-10-CM | POA: Diagnosis not present

## 2017-03-25 DIAGNOSIS — N186 End stage renal disease: Secondary | ICD-10-CM | POA: Diagnosis not present

## 2017-03-25 DIAGNOSIS — D509 Iron deficiency anemia, unspecified: Secondary | ICD-10-CM | POA: Diagnosis not present

## 2017-03-27 DIAGNOSIS — D509 Iron deficiency anemia, unspecified: Secondary | ICD-10-CM | POA: Diagnosis not present

## 2017-03-27 DIAGNOSIS — N2581 Secondary hyperparathyroidism of renal origin: Secondary | ICD-10-CM | POA: Diagnosis not present

## 2017-03-27 DIAGNOSIS — E119 Type 2 diabetes mellitus without complications: Secondary | ICD-10-CM | POA: Diagnosis not present

## 2017-03-27 DIAGNOSIS — N186 End stage renal disease: Secondary | ICD-10-CM | POA: Diagnosis not present

## 2017-03-27 DIAGNOSIS — D631 Anemia in chronic kidney disease: Secondary | ICD-10-CM | POA: Diagnosis not present

## 2017-03-28 ENCOUNTER — Ambulatory Visit: Payer: Medicare Other | Admitting: Neurology

## 2017-03-28 ENCOUNTER — Ambulatory Visit: Payer: Medicare Other | Admitting: Orthotics

## 2017-03-29 DIAGNOSIS — D631 Anemia in chronic kidney disease: Secondary | ICD-10-CM | POA: Diagnosis not present

## 2017-03-29 DIAGNOSIS — D509 Iron deficiency anemia, unspecified: Secondary | ICD-10-CM | POA: Diagnosis not present

## 2017-03-29 DIAGNOSIS — E119 Type 2 diabetes mellitus without complications: Secondary | ICD-10-CM | POA: Diagnosis not present

## 2017-03-29 DIAGNOSIS — N186 End stage renal disease: Secondary | ICD-10-CM | POA: Diagnosis not present

## 2017-03-29 DIAGNOSIS — N2581 Secondary hyperparathyroidism of renal origin: Secondary | ICD-10-CM | POA: Diagnosis not present

## 2017-03-30 DIAGNOSIS — D631 Anemia in chronic kidney disease: Secondary | ICD-10-CM | POA: Diagnosis not present

## 2017-03-30 DIAGNOSIS — D509 Iron deficiency anemia, unspecified: Secondary | ICD-10-CM | POA: Diagnosis not present

## 2017-03-30 DIAGNOSIS — N2581 Secondary hyperparathyroidism of renal origin: Secondary | ICD-10-CM | POA: Diagnosis not present

## 2017-03-30 DIAGNOSIS — E119 Type 2 diabetes mellitus without complications: Secondary | ICD-10-CM | POA: Diagnosis not present

## 2017-03-30 DIAGNOSIS — N186 End stage renal disease: Secondary | ICD-10-CM | POA: Diagnosis not present

## 2017-04-02 ENCOUNTER — Ambulatory Visit (INDEPENDENT_AMBULATORY_CARE_PROVIDER_SITE_OTHER): Payer: Medicare Other | Admitting: Orthotics

## 2017-04-02 DIAGNOSIS — M79676 Pain in unspecified toe(s): Secondary | ICD-10-CM | POA: Diagnosis not present

## 2017-04-02 DIAGNOSIS — E1140 Type 2 diabetes mellitus with diabetic neuropathy, unspecified: Secondary | ICD-10-CM | POA: Diagnosis not present

## 2017-04-02 DIAGNOSIS — M2010 Hallux valgus (acquired), unspecified foot: Secondary | ICD-10-CM

## 2017-04-02 DIAGNOSIS — M2040 Other hammer toe(s) (acquired), unspecified foot: Secondary | ICD-10-CM

## 2017-04-02 DIAGNOSIS — B351 Tinea unguium: Secondary | ICD-10-CM

## 2017-04-02 NOTE — Progress Notes (Signed)

## 2017-04-03 DIAGNOSIS — N186 End stage renal disease: Secondary | ICD-10-CM | POA: Diagnosis not present

## 2017-04-03 DIAGNOSIS — N2581 Secondary hyperparathyroidism of renal origin: Secondary | ICD-10-CM | POA: Diagnosis not present

## 2017-04-03 DIAGNOSIS — D509 Iron deficiency anemia, unspecified: Secondary | ICD-10-CM | POA: Diagnosis not present

## 2017-04-03 DIAGNOSIS — E119 Type 2 diabetes mellitus without complications: Secondary | ICD-10-CM | POA: Diagnosis not present

## 2017-04-03 DIAGNOSIS — D631 Anemia in chronic kidney disease: Secondary | ICD-10-CM | POA: Diagnosis not present

## 2017-04-04 ENCOUNTER — Ambulatory Visit (HOSPITAL_COMMUNITY)
Admission: RE | Admit: 2017-04-04 | Discharge: 2017-04-04 | Disposition: A | Discharge status: Home / Self Care | Payer: Medicare Other | Source: Ambulatory Visit | Attending: Neurology | Admitting: Neurology

## 2017-04-04 ENCOUNTER — Ambulatory Visit (HOSPITAL_BASED_OUTPATIENT_CLINIC_OR_DEPARTMENT_OTHER)
Admission: RE | Admit: 2017-04-04 | Discharge: 2017-04-04 | Disposition: A | Discharge status: Home / Self Care | Payer: Medicare Other | Source: Ambulatory Visit | Attending: Neurology | Admitting: Neurology

## 2017-04-04 DIAGNOSIS — I6990 Unspecified sequelae of unspecified cerebrovascular disease: Secondary | ICD-10-CM

## 2017-04-04 DIAGNOSIS — I6523 Occlusion and stenosis of bilateral carotid arteries: Secondary | ICD-10-CM | POA: Diagnosis not present

## 2017-04-04 NOTE — Progress Notes (Signed)
*  PRELIMINARY RESULTS* Vascular Ultrasound Carotid Duplex (Doppler) has been completed. Bilateral: No significant (1-39%) ICA stenosis. Antegrade vertebral flow.   Transcranial Doppler completed.     Landry Mellow, RDMS, RVT  04/04/2017, 9:40 AM

## 2017-04-05 DIAGNOSIS — D631 Anemia in chronic kidney disease: Secondary | ICD-10-CM | POA: Diagnosis not present

## 2017-04-05 DIAGNOSIS — N186 End stage renal disease: Secondary | ICD-10-CM | POA: Diagnosis not present

## 2017-04-05 DIAGNOSIS — N2581 Secondary hyperparathyroidism of renal origin: Secondary | ICD-10-CM | POA: Diagnosis not present

## 2017-04-05 DIAGNOSIS — E119 Type 2 diabetes mellitus without complications: Secondary | ICD-10-CM | POA: Diagnosis not present

## 2017-04-05 DIAGNOSIS — D509 Iron deficiency anemia, unspecified: Secondary | ICD-10-CM | POA: Diagnosis not present

## 2017-04-05 LAB — VAS US CAROTID
LCCADDIAS: -15 cm/s
LCCAPSYS: 129 cm/s
LEFT ECA DIAS: -8 cm/s
LEFT VERTEBRAL DIAS: 9 cm/s
LICADDIAS: -31 cm/s
LICADSYS: -116 cm/s
LICAPSYS: -105 cm/s
Left CCA dist sys: -120 cm/s
Left CCA prox dias: 20 cm/s
Left ICA prox dias: -32 cm/s
RIGHT ECA DIAS: -7 cm/s
RIGHT VERTEBRAL DIAS: 16 cm/s
Right CCA prox dias: 15 cm/s
Right CCA prox sys: 86 cm/s
Right cca dist sys: -101 cm/s

## 2017-04-07 DIAGNOSIS — N186 End stage renal disease: Secondary | ICD-10-CM | POA: Diagnosis not present

## 2017-04-07 DIAGNOSIS — I120 Hypertensive chronic kidney disease with stage 5 chronic kidney disease or end stage renal disease: Secondary | ICD-10-CM | POA: Diagnosis not present

## 2017-04-07 DIAGNOSIS — Z992 Dependence on renal dialysis: Secondary | ICD-10-CM | POA: Diagnosis not present

## 2017-04-08 DIAGNOSIS — E119 Type 2 diabetes mellitus without complications: Secondary | ICD-10-CM | POA: Diagnosis not present

## 2017-04-08 DIAGNOSIS — N186 End stage renal disease: Secondary | ICD-10-CM | POA: Diagnosis not present

## 2017-04-08 DIAGNOSIS — D631 Anemia in chronic kidney disease: Secondary | ICD-10-CM | POA: Diagnosis not present

## 2017-04-08 DIAGNOSIS — N2581 Secondary hyperparathyroidism of renal origin: Secondary | ICD-10-CM | POA: Diagnosis not present

## 2017-04-08 DIAGNOSIS — D509 Iron deficiency anemia, unspecified: Secondary | ICD-10-CM | POA: Diagnosis not present

## 2017-04-09 DIAGNOSIS — D631 Anemia in chronic kidney disease: Secondary | ICD-10-CM | POA: Diagnosis not present

## 2017-04-09 DIAGNOSIS — E119 Type 2 diabetes mellitus without complications: Secondary | ICD-10-CM | POA: Diagnosis not present

## 2017-04-09 DIAGNOSIS — N2581 Secondary hyperparathyroidism of renal origin: Secondary | ICD-10-CM | POA: Diagnosis not present

## 2017-04-09 DIAGNOSIS — N186 End stage renal disease: Secondary | ICD-10-CM | POA: Diagnosis not present

## 2017-04-09 DIAGNOSIS — D509 Iron deficiency anemia, unspecified: Secondary | ICD-10-CM | POA: Diagnosis not present

## 2017-04-12 DIAGNOSIS — N186 End stage renal disease: Secondary | ICD-10-CM | POA: Diagnosis not present

## 2017-04-12 DIAGNOSIS — D509 Iron deficiency anemia, unspecified: Secondary | ICD-10-CM | POA: Diagnosis not present

## 2017-04-12 DIAGNOSIS — D631 Anemia in chronic kidney disease: Secondary | ICD-10-CM | POA: Diagnosis not present

## 2017-04-12 DIAGNOSIS — E119 Type 2 diabetes mellitus without complications: Secondary | ICD-10-CM | POA: Diagnosis not present

## 2017-04-12 DIAGNOSIS — N2581 Secondary hyperparathyroidism of renal origin: Secondary | ICD-10-CM | POA: Diagnosis not present

## 2017-04-15 ENCOUNTER — Telehealth: Payer: Self-pay

## 2017-04-15 DIAGNOSIS — N2581 Secondary hyperparathyroidism of renal origin: Secondary | ICD-10-CM | POA: Diagnosis not present

## 2017-04-15 DIAGNOSIS — D509 Iron deficiency anemia, unspecified: Secondary | ICD-10-CM | POA: Diagnosis not present

## 2017-04-15 DIAGNOSIS — E119 Type 2 diabetes mellitus without complications: Secondary | ICD-10-CM | POA: Diagnosis not present

## 2017-04-15 DIAGNOSIS — N186 End stage renal disease: Secondary | ICD-10-CM | POA: Diagnosis not present

## 2017-04-15 DIAGNOSIS — D631 Anemia in chronic kidney disease: Secondary | ICD-10-CM | POA: Diagnosis not present

## 2017-04-15 NOTE — Telephone Encounter (Signed)
-----   Message from Garvin Fila, MD sent at 04/13/2017 11:58 AM EDT ----- Shawn [name] inform the patient that transcranial Doppler study was essentially unremarkable except the study is slightly suboptimal due to poor bony windows due to thick skull

## 2017-04-15 NOTE — Telephone Encounter (Signed)
Rn call patient that the transcranial doppler was unremarkable. Slightly suboptimal due to poor bony windows due to thick skull. Pt verbalized understanding.   Rn call patient that the carotid ultrasound was unremarkable. Pt verbalized understanding ------

## 2017-04-16 ENCOUNTER — Ambulatory Visit (INDEPENDENT_AMBULATORY_CARE_PROVIDER_SITE_OTHER): Payer: Medicare Other | Admitting: Podiatry

## 2017-04-16 ENCOUNTER — Encounter: Payer: Self-pay | Admitting: Podiatry

## 2017-04-16 DIAGNOSIS — B351 Tinea unguium: Secondary | ICD-10-CM

## 2017-04-16 DIAGNOSIS — M2010 Hallux valgus (acquired), unspecified foot: Secondary | ICD-10-CM

## 2017-04-16 DIAGNOSIS — M2040 Other hammer toe(s) (acquired), unspecified foot: Secondary | ICD-10-CM

## 2017-04-16 DIAGNOSIS — M79676 Pain in unspecified toe(s): Secondary | ICD-10-CM | POA: Diagnosis not present

## 2017-04-16 DIAGNOSIS — E1140 Type 2 diabetes mellitus with diabetic neuropathy, unspecified: Secondary | ICD-10-CM

## 2017-04-16 NOTE — Progress Notes (Addendum)
Patient ID: Shawn [name], male   DOB: 01/02/1961, 56 y.o.   MRN: [id number] Complaint:  Visit Type: Patient returns to my office for continued preventative foot care services. Complaint: Patient states" my nails have grown long and thick and become painful to walk and wear shoes" Patient has been diagnosed with DM with neuropathy and angiopathy.. The patient presents for preventative foot care services. No changes to ROS  Podiatric Exam: Vascular: dorsalis pedis and posterior tibial pulses are not  palpable bilateral. Capillary return is immediate. Cold feet  B/L Skin turgor WNL  Sensorium: Diminished Semmes Weinstein monofilament test. Normal tactile sensation bilaterally. Nail Exam: Pt has thick disfigured discolored nails with subungual debris noted bilateral entire nail hallux through fifth toenails Ulcer Exam: There is no evidence of ulcer or pre-ulcerative changes or infection. Orthopedic Exam: Muscle tone and strength are WNL. No limitations in general ROM. No crepitus or effusions noted. Foot type and digits show no abnormalities. HAV B/L  Hammer toe 2  B/L Skin: No Porokeratosis. No infection or ulcers.    No signs of redness or infection or swelling.  Diagnosis:  Onychomycosis, , Pain in right toe, pain in left toes  Treatment & Plan Procedures and Treatment: Consent by patient was obtained for treatment procedures. The patient understood the discussion of treatment and procedures well. All questions were answered thoroughly reviewed. Debridement of mycotic and hypertrophic toenails, 1 through 5 bilateral and clearing of subungual debris. No ulceration, no infection noted. Patient says his diabetic shoes are tight on the outside.  Signed ABN for 2018. Return Visit-Office Procedure: Patient instructed to return to the office for a follow up visit 3 months for continued evaluation and treatment.  Gardiner Barefoot DPM

## 2017-04-17 DIAGNOSIS — N186 End stage renal disease: Secondary | ICD-10-CM | POA: Diagnosis not present

## 2017-04-17 DIAGNOSIS — D631 Anemia in chronic kidney disease: Secondary | ICD-10-CM | POA: Diagnosis not present

## 2017-04-17 DIAGNOSIS — N2581 Secondary hyperparathyroidism of renal origin: Secondary | ICD-10-CM | POA: Diagnosis not present

## 2017-04-17 DIAGNOSIS — D509 Iron deficiency anemia, unspecified: Secondary | ICD-10-CM | POA: Diagnosis not present

## 2017-04-17 DIAGNOSIS — E119 Type 2 diabetes mellitus without complications: Secondary | ICD-10-CM | POA: Diagnosis not present

## 2017-04-19 DIAGNOSIS — E119 Type 2 diabetes mellitus without complications: Secondary | ICD-10-CM | POA: Diagnosis not present

## 2017-04-19 DIAGNOSIS — D509 Iron deficiency anemia, unspecified: Secondary | ICD-10-CM | POA: Diagnosis not present

## 2017-04-19 DIAGNOSIS — N2581 Secondary hyperparathyroidism of renal origin: Secondary | ICD-10-CM | POA: Diagnosis not present

## 2017-04-19 DIAGNOSIS — D631 Anemia in chronic kidney disease: Secondary | ICD-10-CM | POA: Diagnosis not present

## 2017-04-19 DIAGNOSIS — N186 End stage renal disease: Secondary | ICD-10-CM | POA: Diagnosis not present

## 2017-04-22 DIAGNOSIS — D631 Anemia in chronic kidney disease: Secondary | ICD-10-CM | POA: Diagnosis not present

## 2017-04-22 DIAGNOSIS — N186 End stage renal disease: Secondary | ICD-10-CM | POA: Diagnosis not present

## 2017-04-22 DIAGNOSIS — E119 Type 2 diabetes mellitus without complications: Secondary | ICD-10-CM | POA: Diagnosis not present

## 2017-04-22 DIAGNOSIS — N2581 Secondary hyperparathyroidism of renal origin: Secondary | ICD-10-CM | POA: Diagnosis not present

## 2017-04-22 DIAGNOSIS — D509 Iron deficiency anemia, unspecified: Secondary | ICD-10-CM | POA: Diagnosis not present

## 2017-04-24 DIAGNOSIS — N186 End stage renal disease: Secondary | ICD-10-CM | POA: Diagnosis not present

## 2017-04-24 DIAGNOSIS — E119 Type 2 diabetes mellitus without complications: Secondary | ICD-10-CM | POA: Diagnosis not present

## 2017-04-24 DIAGNOSIS — N2581 Secondary hyperparathyroidism of renal origin: Secondary | ICD-10-CM | POA: Diagnosis not present

## 2017-04-24 DIAGNOSIS — D631 Anemia in chronic kidney disease: Secondary | ICD-10-CM | POA: Diagnosis not present

## 2017-04-24 DIAGNOSIS — D509 Iron deficiency anemia, unspecified: Secondary | ICD-10-CM | POA: Diagnosis not present

## 2017-04-25 DIAGNOSIS — H401123 Primary open-angle glaucoma, left eye, severe stage: Secondary | ICD-10-CM | POA: Diagnosis not present

## 2017-04-26 DIAGNOSIS — D509 Iron deficiency anemia, unspecified: Secondary | ICD-10-CM | POA: Diagnosis not present

## 2017-04-26 DIAGNOSIS — N186 End stage renal disease: Secondary | ICD-10-CM | POA: Diagnosis not present

## 2017-04-26 DIAGNOSIS — N2581 Secondary hyperparathyroidism of renal origin: Secondary | ICD-10-CM | POA: Diagnosis not present

## 2017-04-26 DIAGNOSIS — D631 Anemia in chronic kidney disease: Secondary | ICD-10-CM | POA: Diagnosis not present

## 2017-04-26 DIAGNOSIS — E119 Type 2 diabetes mellitus without complications: Secondary | ICD-10-CM | POA: Diagnosis not present

## 2017-04-29 DIAGNOSIS — D509 Iron deficiency anemia, unspecified: Secondary | ICD-10-CM | POA: Diagnosis not present

## 2017-04-29 DIAGNOSIS — N186 End stage renal disease: Secondary | ICD-10-CM | POA: Diagnosis not present

## 2017-04-29 DIAGNOSIS — E119 Type 2 diabetes mellitus without complications: Secondary | ICD-10-CM | POA: Diagnosis not present

## 2017-04-29 DIAGNOSIS — D631 Anemia in chronic kidney disease: Secondary | ICD-10-CM | POA: Diagnosis not present

## 2017-04-29 DIAGNOSIS — N2581 Secondary hyperparathyroidism of renal origin: Secondary | ICD-10-CM | POA: Diagnosis not present

## 2017-05-01 DIAGNOSIS — E1129 Type 2 diabetes mellitus with other diabetic kidney complication: Secondary | ICD-10-CM | POA: Diagnosis not present

## 2017-05-01 DIAGNOSIS — N2581 Secondary hyperparathyroidism of renal origin: Secondary | ICD-10-CM | POA: Diagnosis not present

## 2017-05-01 DIAGNOSIS — D509 Iron deficiency anemia, unspecified: Secondary | ICD-10-CM | POA: Diagnosis not present

## 2017-05-01 DIAGNOSIS — E119 Type 2 diabetes mellitus without complications: Secondary | ICD-10-CM | POA: Diagnosis not present

## 2017-05-01 DIAGNOSIS — N186 End stage renal disease: Secondary | ICD-10-CM | POA: Diagnosis not present

## 2017-05-01 DIAGNOSIS — D631 Anemia in chronic kidney disease: Secondary | ICD-10-CM | POA: Diagnosis not present

## 2017-05-03 DIAGNOSIS — N186 End stage renal disease: Secondary | ICD-10-CM | POA: Diagnosis not present

## 2017-05-03 DIAGNOSIS — N2581 Secondary hyperparathyroidism of renal origin: Secondary | ICD-10-CM | POA: Diagnosis not present

## 2017-05-03 DIAGNOSIS — D509 Iron deficiency anemia, unspecified: Secondary | ICD-10-CM | POA: Diagnosis not present

## 2017-05-03 DIAGNOSIS — E119 Type 2 diabetes mellitus without complications: Secondary | ICD-10-CM | POA: Diagnosis not present

## 2017-05-03 DIAGNOSIS — D631 Anemia in chronic kidney disease: Secondary | ICD-10-CM | POA: Diagnosis not present

## 2017-05-06 DIAGNOSIS — N2581 Secondary hyperparathyroidism of renal origin: Secondary | ICD-10-CM | POA: Diagnosis not present

## 2017-05-06 DIAGNOSIS — D631 Anemia in chronic kidney disease: Secondary | ICD-10-CM | POA: Diagnosis not present

## 2017-05-06 DIAGNOSIS — D509 Iron deficiency anemia, unspecified: Secondary | ICD-10-CM | POA: Diagnosis not present

## 2017-05-06 DIAGNOSIS — E119 Type 2 diabetes mellitus without complications: Secondary | ICD-10-CM | POA: Diagnosis not present

## 2017-05-06 DIAGNOSIS — N186 End stage renal disease: Secondary | ICD-10-CM | POA: Diagnosis not present

## 2017-05-08 DIAGNOSIS — D509 Iron deficiency anemia, unspecified: Secondary | ICD-10-CM | POA: Diagnosis not present

## 2017-05-08 DIAGNOSIS — E119 Type 2 diabetes mellitus without complications: Secondary | ICD-10-CM | POA: Diagnosis not present

## 2017-05-08 DIAGNOSIS — N186 End stage renal disease: Secondary | ICD-10-CM | POA: Diagnosis not present

## 2017-05-08 DIAGNOSIS — Z992 Dependence on renal dialysis: Secondary | ICD-10-CM | POA: Diagnosis not present

## 2017-05-08 DIAGNOSIS — I120 Hypertensive chronic kidney disease with stage 5 chronic kidney disease or end stage renal disease: Secondary | ICD-10-CM | POA: Diagnosis not present

## 2017-05-08 DIAGNOSIS — D631 Anemia in chronic kidney disease: Secondary | ICD-10-CM | POA: Diagnosis not present

## 2017-05-08 DIAGNOSIS — N2581 Secondary hyperparathyroidism of renal origin: Secondary | ICD-10-CM | POA: Diagnosis not present

## 2017-05-09 DIAGNOSIS — E119 Type 2 diabetes mellitus without complications: Secondary | ICD-10-CM | POA: Diagnosis not present

## 2017-05-09 DIAGNOSIS — D509 Iron deficiency anemia, unspecified: Secondary | ICD-10-CM | POA: Diagnosis not present

## 2017-05-09 DIAGNOSIS — N186 End stage renal disease: Secondary | ICD-10-CM | POA: Diagnosis not present

## 2017-05-09 DIAGNOSIS — N2581 Secondary hyperparathyroidism of renal origin: Secondary | ICD-10-CM | POA: Diagnosis not present

## 2017-05-09 DIAGNOSIS — D631 Anemia in chronic kidney disease: Secondary | ICD-10-CM | POA: Diagnosis not present

## 2017-05-13 DIAGNOSIS — D509 Iron deficiency anemia, unspecified: Secondary | ICD-10-CM | POA: Diagnosis not present

## 2017-05-13 DIAGNOSIS — N186 End stage renal disease: Secondary | ICD-10-CM | POA: Diagnosis not present

## 2017-05-13 DIAGNOSIS — E119 Type 2 diabetes mellitus without complications: Secondary | ICD-10-CM | POA: Diagnosis not present

## 2017-05-13 DIAGNOSIS — D631 Anemia in chronic kidney disease: Secondary | ICD-10-CM | POA: Diagnosis not present

## 2017-05-13 DIAGNOSIS — N2581 Secondary hyperparathyroidism of renal origin: Secondary | ICD-10-CM | POA: Diagnosis not present

## 2017-05-15 DIAGNOSIS — D631 Anemia in chronic kidney disease: Secondary | ICD-10-CM | POA: Diagnosis not present

## 2017-05-15 DIAGNOSIS — E119 Type 2 diabetes mellitus without complications: Secondary | ICD-10-CM | POA: Diagnosis not present

## 2017-05-15 DIAGNOSIS — D509 Iron deficiency anemia, unspecified: Secondary | ICD-10-CM | POA: Diagnosis not present

## 2017-05-15 DIAGNOSIS — N2581 Secondary hyperparathyroidism of renal origin: Secondary | ICD-10-CM | POA: Diagnosis not present

## 2017-05-15 DIAGNOSIS — N186 End stage renal disease: Secondary | ICD-10-CM | POA: Diagnosis not present

## 2017-05-17 DIAGNOSIS — N2581 Secondary hyperparathyroidism of renal origin: Secondary | ICD-10-CM | POA: Diagnosis not present

## 2017-05-17 DIAGNOSIS — E119 Type 2 diabetes mellitus without complications: Secondary | ICD-10-CM | POA: Diagnosis not present

## 2017-05-17 DIAGNOSIS — D631 Anemia in chronic kidney disease: Secondary | ICD-10-CM | POA: Diagnosis not present

## 2017-05-17 DIAGNOSIS — N186 End stage renal disease: Secondary | ICD-10-CM | POA: Diagnosis not present

## 2017-05-17 DIAGNOSIS — D509 Iron deficiency anemia, unspecified: Secondary | ICD-10-CM | POA: Diagnosis not present

## 2017-05-20 DIAGNOSIS — D631 Anemia in chronic kidney disease: Secondary | ICD-10-CM | POA: Diagnosis not present

## 2017-05-20 DIAGNOSIS — N2581 Secondary hyperparathyroidism of renal origin: Secondary | ICD-10-CM | POA: Diagnosis not present

## 2017-05-20 DIAGNOSIS — D509 Iron deficiency anemia, unspecified: Secondary | ICD-10-CM | POA: Diagnosis not present

## 2017-05-20 DIAGNOSIS — N186 End stage renal disease: Secondary | ICD-10-CM | POA: Diagnosis not present

## 2017-05-20 DIAGNOSIS — E119 Type 2 diabetes mellitus without complications: Secondary | ICD-10-CM | POA: Diagnosis not present

## 2017-05-21 DIAGNOSIS — H10413 Chronic giant papillary conjunctivitis, bilateral: Secondary | ICD-10-CM | POA: Diagnosis not present

## 2017-05-21 DIAGNOSIS — H04123 Dry eye syndrome of bilateral lacrimal glands: Secondary | ICD-10-CM | POA: Diagnosis not present

## 2017-05-21 DIAGNOSIS — H00024 Hordeolum internum left upper eyelid: Secondary | ICD-10-CM | POA: Diagnosis not present

## 2017-05-21 DIAGNOSIS — H16122 Filamentary keratitis, left eye: Secondary | ICD-10-CM | POA: Diagnosis not present

## 2017-05-23 DIAGNOSIS — I639 Cerebral infarction, unspecified: Secondary | ICD-10-CM | POA: Insufficient documentation

## 2017-05-24 DIAGNOSIS — E119 Type 2 diabetes mellitus without complications: Secondary | ICD-10-CM | POA: Diagnosis not present

## 2017-05-24 DIAGNOSIS — D509 Iron deficiency anemia, unspecified: Secondary | ICD-10-CM | POA: Diagnosis not present

## 2017-05-24 DIAGNOSIS — D631 Anemia in chronic kidney disease: Secondary | ICD-10-CM | POA: Diagnosis not present

## 2017-05-24 DIAGNOSIS — N186 End stage renal disease: Secondary | ICD-10-CM | POA: Diagnosis not present

## 2017-05-24 DIAGNOSIS — N2581 Secondary hyperparathyroidism of renal origin: Secondary | ICD-10-CM | POA: Diagnosis not present

## 2017-05-26 DIAGNOSIS — N186 End stage renal disease: Secondary | ICD-10-CM | POA: Diagnosis not present

## 2017-05-26 DIAGNOSIS — N2581 Secondary hyperparathyroidism of renal origin: Secondary | ICD-10-CM | POA: Diagnosis not present

## 2017-05-26 DIAGNOSIS — D631 Anemia in chronic kidney disease: Secondary | ICD-10-CM | POA: Diagnosis not present

## 2017-05-26 DIAGNOSIS — E119 Type 2 diabetes mellitus without complications: Secondary | ICD-10-CM | POA: Diagnosis not present

## 2017-05-26 DIAGNOSIS — D509 Iron deficiency anemia, unspecified: Secondary | ICD-10-CM | POA: Diagnosis not present

## 2017-05-28 DIAGNOSIS — D509 Iron deficiency anemia, unspecified: Secondary | ICD-10-CM | POA: Diagnosis not present

## 2017-05-28 DIAGNOSIS — E119 Type 2 diabetes mellitus without complications: Secondary | ICD-10-CM | POA: Diagnosis not present

## 2017-05-28 DIAGNOSIS — D631 Anemia in chronic kidney disease: Secondary | ICD-10-CM | POA: Diagnosis not present

## 2017-05-28 DIAGNOSIS — N2581 Secondary hyperparathyroidism of renal origin: Secondary | ICD-10-CM | POA: Diagnosis not present

## 2017-05-28 DIAGNOSIS — N186 End stage renal disease: Secondary | ICD-10-CM | POA: Diagnosis not present

## 2017-05-31 DIAGNOSIS — D509 Iron deficiency anemia, unspecified: Secondary | ICD-10-CM | POA: Diagnosis not present

## 2017-05-31 DIAGNOSIS — E119 Type 2 diabetes mellitus without complications: Secondary | ICD-10-CM | POA: Diagnosis not present

## 2017-05-31 DIAGNOSIS — D631 Anemia in chronic kidney disease: Secondary | ICD-10-CM | POA: Diagnosis not present

## 2017-05-31 DIAGNOSIS — N2581 Secondary hyperparathyroidism of renal origin: Secondary | ICD-10-CM | POA: Diagnosis not present

## 2017-05-31 DIAGNOSIS — N186 End stage renal disease: Secondary | ICD-10-CM | POA: Diagnosis not present

## 2017-06-03 DIAGNOSIS — E119 Type 2 diabetes mellitus without complications: Secondary | ICD-10-CM | POA: Diagnosis not present

## 2017-06-03 DIAGNOSIS — N186 End stage renal disease: Secondary | ICD-10-CM | POA: Diagnosis not present

## 2017-06-03 DIAGNOSIS — D631 Anemia in chronic kidney disease: Secondary | ICD-10-CM | POA: Diagnosis not present

## 2017-06-03 DIAGNOSIS — N2581 Secondary hyperparathyroidism of renal origin: Secondary | ICD-10-CM | POA: Diagnosis not present

## 2017-06-03 DIAGNOSIS — D509 Iron deficiency anemia, unspecified: Secondary | ICD-10-CM | POA: Diagnosis not present

## 2017-06-04 DIAGNOSIS — E113592 Type 2 diabetes mellitus with proliferative diabetic retinopathy without macular edema, left eye: Secondary | ICD-10-CM | POA: Diagnosis not present

## 2017-06-04 DIAGNOSIS — I6990 Unspecified sequelae of unspecified cerebrovascular disease: Secondary | ICD-10-CM | POA: Diagnosis not present

## 2017-06-04 DIAGNOSIS — Z125 Encounter for screening for malignant neoplasm of prostate: Secondary | ICD-10-CM | POA: Diagnosis not present

## 2017-06-04 DIAGNOSIS — E669 Obesity, unspecified: Secondary | ICD-10-CM | POA: Diagnosis not present

## 2017-06-04 DIAGNOSIS — I2510 Atherosclerotic heart disease of native coronary artery without angina pectoris: Secondary | ICD-10-CM | POA: Diagnosis not present

## 2017-06-04 DIAGNOSIS — Z79899 Other long term (current) drug therapy: Secondary | ICD-10-CM | POA: Diagnosis not present

## 2017-06-04 DIAGNOSIS — E1165 Type 2 diabetes mellitus with hyperglycemia: Secondary | ICD-10-CM | POA: Diagnosis not present

## 2017-06-04 DIAGNOSIS — E11319 Type 2 diabetes mellitus with unspecified diabetic retinopathy without macular edema: Secondary | ICD-10-CM | POA: Diagnosis not present

## 2017-06-04 DIAGNOSIS — E559 Vitamin D deficiency, unspecified: Secondary | ICD-10-CM | POA: Diagnosis not present

## 2017-06-04 DIAGNOSIS — E1140 Type 2 diabetes mellitus with diabetic neuropathy, unspecified: Secondary | ICD-10-CM | POA: Diagnosis not present

## 2017-06-04 DIAGNOSIS — E7800 Pure hypercholesterolemia, unspecified: Secondary | ICD-10-CM | POA: Diagnosis not present

## 2017-06-04 DIAGNOSIS — I119 Hypertensive heart disease without heart failure: Secondary | ICD-10-CM | POA: Diagnosis not present

## 2017-06-05 DIAGNOSIS — D509 Iron deficiency anemia, unspecified: Secondary | ICD-10-CM | POA: Diagnosis not present

## 2017-06-05 DIAGNOSIS — E119 Type 2 diabetes mellitus without complications: Secondary | ICD-10-CM | POA: Diagnosis not present

## 2017-06-05 DIAGNOSIS — N186 End stage renal disease: Secondary | ICD-10-CM | POA: Diagnosis not present

## 2017-06-05 DIAGNOSIS — D631 Anemia in chronic kidney disease: Secondary | ICD-10-CM | POA: Diagnosis not present

## 2017-06-05 DIAGNOSIS — N2581 Secondary hyperparathyroidism of renal origin: Secondary | ICD-10-CM | POA: Diagnosis not present

## 2017-06-07 DIAGNOSIS — N2581 Secondary hyperparathyroidism of renal origin: Secondary | ICD-10-CM | POA: Diagnosis not present

## 2017-06-07 DIAGNOSIS — Z992 Dependence on renal dialysis: Secondary | ICD-10-CM | POA: Diagnosis not present

## 2017-06-07 DIAGNOSIS — I120 Hypertensive chronic kidney disease with stage 5 chronic kidney disease or end stage renal disease: Secondary | ICD-10-CM | POA: Diagnosis not present

## 2017-06-07 DIAGNOSIS — D631 Anemia in chronic kidney disease: Secondary | ICD-10-CM | POA: Diagnosis not present

## 2017-06-07 DIAGNOSIS — N186 End stage renal disease: Secondary | ICD-10-CM | POA: Diagnosis not present

## 2017-06-07 DIAGNOSIS — D509 Iron deficiency anemia, unspecified: Secondary | ICD-10-CM | POA: Diagnosis not present

## 2017-06-07 DIAGNOSIS — E119 Type 2 diabetes mellitus without complications: Secondary | ICD-10-CM | POA: Diagnosis not present

## 2017-06-10 DIAGNOSIS — N2581 Secondary hyperparathyroidism of renal origin: Secondary | ICD-10-CM | POA: Diagnosis not present

## 2017-06-10 DIAGNOSIS — N186 End stage renal disease: Secondary | ICD-10-CM | POA: Diagnosis not present

## 2017-06-10 DIAGNOSIS — E119 Type 2 diabetes mellitus without complications: Secondary | ICD-10-CM | POA: Diagnosis not present

## 2017-06-10 DIAGNOSIS — D631 Anemia in chronic kidney disease: Secondary | ICD-10-CM | POA: Diagnosis not present

## 2017-06-10 DIAGNOSIS — D509 Iron deficiency anemia, unspecified: Secondary | ICD-10-CM | POA: Diagnosis not present

## 2017-06-12 DIAGNOSIS — E119 Type 2 diabetes mellitus without complications: Secondary | ICD-10-CM | POA: Diagnosis not present

## 2017-06-12 DIAGNOSIS — D509 Iron deficiency anemia, unspecified: Secondary | ICD-10-CM | POA: Diagnosis not present

## 2017-06-12 DIAGNOSIS — N2581 Secondary hyperparathyroidism of renal origin: Secondary | ICD-10-CM | POA: Diagnosis not present

## 2017-06-12 DIAGNOSIS — N186 End stage renal disease: Secondary | ICD-10-CM | POA: Diagnosis not present

## 2017-06-12 DIAGNOSIS — D631 Anemia in chronic kidney disease: Secondary | ICD-10-CM | POA: Diagnosis not present

## 2017-06-13 DIAGNOSIS — Z961 Presence of intraocular lens: Secondary | ICD-10-CM | POA: Diagnosis not present

## 2017-06-13 DIAGNOSIS — H04123 Dry eye syndrome of bilateral lacrimal glands: Secondary | ICD-10-CM | POA: Diagnosis not present

## 2017-06-13 DIAGNOSIS — H401123 Primary open-angle glaucoma, left eye, severe stage: Secondary | ICD-10-CM | POA: Diagnosis not present

## 2017-06-14 DIAGNOSIS — D509 Iron deficiency anemia, unspecified: Secondary | ICD-10-CM | POA: Diagnosis not present

## 2017-06-14 DIAGNOSIS — E119 Type 2 diabetes mellitus without complications: Secondary | ICD-10-CM | POA: Diagnosis not present

## 2017-06-14 DIAGNOSIS — N186 End stage renal disease: Secondary | ICD-10-CM | POA: Diagnosis not present

## 2017-06-14 DIAGNOSIS — D631 Anemia in chronic kidney disease: Secondary | ICD-10-CM | POA: Diagnosis not present

## 2017-06-14 DIAGNOSIS — N2581 Secondary hyperparathyroidism of renal origin: Secondary | ICD-10-CM | POA: Diagnosis not present

## 2017-06-19 DIAGNOSIS — N2581 Secondary hyperparathyroidism of renal origin: Secondary | ICD-10-CM | POA: Diagnosis not present

## 2017-06-19 DIAGNOSIS — D631 Anemia in chronic kidney disease: Secondary | ICD-10-CM | POA: Diagnosis not present

## 2017-06-19 DIAGNOSIS — D509 Iron deficiency anemia, unspecified: Secondary | ICD-10-CM | POA: Diagnosis not present

## 2017-06-19 DIAGNOSIS — E119 Type 2 diabetes mellitus without complications: Secondary | ICD-10-CM | POA: Diagnosis not present

## 2017-06-19 DIAGNOSIS — I10 Essential (primary) hypertension: Secondary | ICD-10-CM | POA: Diagnosis not present

## 2017-06-19 DIAGNOSIS — N179 Acute kidney failure, unspecified: Secondary | ICD-10-CM | POA: Diagnosis not present

## 2017-06-19 DIAGNOSIS — N186 End stage renal disease: Secondary | ICD-10-CM | POA: Diagnosis not present

## 2017-06-21 DIAGNOSIS — N2581 Secondary hyperparathyroidism of renal origin: Secondary | ICD-10-CM | POA: Diagnosis not present

## 2017-06-21 DIAGNOSIS — N186 End stage renal disease: Secondary | ICD-10-CM | POA: Diagnosis not present

## 2017-06-21 DIAGNOSIS — D631 Anemia in chronic kidney disease: Secondary | ICD-10-CM | POA: Diagnosis not present

## 2017-06-21 DIAGNOSIS — D509 Iron deficiency anemia, unspecified: Secondary | ICD-10-CM | POA: Diagnosis not present

## 2017-06-21 DIAGNOSIS — E119 Type 2 diabetes mellitus without complications: Secondary | ICD-10-CM | POA: Diagnosis not present

## 2017-06-24 DIAGNOSIS — E119 Type 2 diabetes mellitus without complications: Secondary | ICD-10-CM | POA: Diagnosis not present

## 2017-06-24 DIAGNOSIS — N186 End stage renal disease: Secondary | ICD-10-CM | POA: Diagnosis not present

## 2017-06-24 DIAGNOSIS — D509 Iron deficiency anemia, unspecified: Secondary | ICD-10-CM | POA: Diagnosis not present

## 2017-06-24 DIAGNOSIS — N2581 Secondary hyperparathyroidism of renal origin: Secondary | ICD-10-CM | POA: Diagnosis not present

## 2017-06-24 DIAGNOSIS — D631 Anemia in chronic kidney disease: Secondary | ICD-10-CM | POA: Diagnosis not present

## 2017-06-26 DIAGNOSIS — N186 End stage renal disease: Secondary | ICD-10-CM | POA: Diagnosis not present

## 2017-06-26 DIAGNOSIS — E119 Type 2 diabetes mellitus without complications: Secondary | ICD-10-CM | POA: Diagnosis not present

## 2017-06-26 DIAGNOSIS — N2581 Secondary hyperparathyroidism of renal origin: Secondary | ICD-10-CM | POA: Diagnosis not present

## 2017-06-26 DIAGNOSIS — D509 Iron deficiency anemia, unspecified: Secondary | ICD-10-CM | POA: Diagnosis not present

## 2017-06-26 DIAGNOSIS — D631 Anemia in chronic kidney disease: Secondary | ICD-10-CM | POA: Diagnosis not present

## 2017-06-27 DIAGNOSIS — H33051 Total retinal detachment, right eye: Secondary | ICD-10-CM | POA: Diagnosis not present

## 2017-06-27 DIAGNOSIS — E113553 Type 2 diabetes mellitus with stable proliferative diabetic retinopathy, bilateral: Secondary | ICD-10-CM | POA: Diagnosis not present

## 2017-06-27 DIAGNOSIS — H54413A Blindness right eye category 3, normal vision left eye: Secondary | ICD-10-CM | POA: Diagnosis not present

## 2017-06-27 DIAGNOSIS — H4720 Unspecified optic atrophy: Secondary | ICD-10-CM | POA: Diagnosis not present

## 2017-06-27 DIAGNOSIS — H401122 Primary open-angle glaucoma, left eye, moderate stage: Secondary | ICD-10-CM | POA: Diagnosis not present

## 2017-06-28 DIAGNOSIS — D509 Iron deficiency anemia, unspecified: Secondary | ICD-10-CM | POA: Diagnosis not present

## 2017-06-28 DIAGNOSIS — D631 Anemia in chronic kidney disease: Secondary | ICD-10-CM | POA: Diagnosis not present

## 2017-06-28 DIAGNOSIS — E119 Type 2 diabetes mellitus without complications: Secondary | ICD-10-CM | POA: Diagnosis not present

## 2017-06-28 DIAGNOSIS — N186 End stage renal disease: Secondary | ICD-10-CM | POA: Diagnosis not present

## 2017-06-28 DIAGNOSIS — N2581 Secondary hyperparathyroidism of renal origin: Secondary | ICD-10-CM | POA: Diagnosis not present

## 2017-06-30 DIAGNOSIS — E119 Type 2 diabetes mellitus without complications: Secondary | ICD-10-CM | POA: Diagnosis not present

## 2017-06-30 DIAGNOSIS — N186 End stage renal disease: Secondary | ICD-10-CM | POA: Diagnosis not present

## 2017-06-30 DIAGNOSIS — D509 Iron deficiency anemia, unspecified: Secondary | ICD-10-CM | POA: Diagnosis not present

## 2017-06-30 DIAGNOSIS — D631 Anemia in chronic kidney disease: Secondary | ICD-10-CM | POA: Diagnosis not present

## 2017-06-30 DIAGNOSIS — N2581 Secondary hyperparathyroidism of renal origin: Secondary | ICD-10-CM | POA: Diagnosis not present

## 2017-07-03 DIAGNOSIS — D509 Iron deficiency anemia, unspecified: Secondary | ICD-10-CM | POA: Diagnosis not present

## 2017-07-03 DIAGNOSIS — E119 Type 2 diabetes mellitus without complications: Secondary | ICD-10-CM | POA: Diagnosis not present

## 2017-07-03 DIAGNOSIS — D631 Anemia in chronic kidney disease: Secondary | ICD-10-CM | POA: Diagnosis not present

## 2017-07-03 DIAGNOSIS — N2581 Secondary hyperparathyroidism of renal origin: Secondary | ICD-10-CM | POA: Diagnosis not present

## 2017-07-03 DIAGNOSIS — N186 End stage renal disease: Secondary | ICD-10-CM | POA: Diagnosis not present

## 2017-07-05 DIAGNOSIS — N186 End stage renal disease: Secondary | ICD-10-CM | POA: Diagnosis not present

## 2017-07-05 DIAGNOSIS — E119 Type 2 diabetes mellitus without complications: Secondary | ICD-10-CM | POA: Diagnosis not present

## 2017-07-05 DIAGNOSIS — D631 Anemia in chronic kidney disease: Secondary | ICD-10-CM | POA: Diagnosis not present

## 2017-07-05 DIAGNOSIS — N2581 Secondary hyperparathyroidism of renal origin: Secondary | ICD-10-CM | POA: Diagnosis not present

## 2017-07-05 DIAGNOSIS — D509 Iron deficiency anemia, unspecified: Secondary | ICD-10-CM | POA: Diagnosis not present

## 2017-07-07 DIAGNOSIS — E119 Type 2 diabetes mellitus without complications: Secondary | ICD-10-CM | POA: Diagnosis not present

## 2017-07-07 DIAGNOSIS — D631 Anemia in chronic kidney disease: Secondary | ICD-10-CM | POA: Diagnosis not present

## 2017-07-07 DIAGNOSIS — N2581 Secondary hyperparathyroidism of renal origin: Secondary | ICD-10-CM | POA: Diagnosis not present

## 2017-07-07 DIAGNOSIS — D509 Iron deficiency anemia, unspecified: Secondary | ICD-10-CM | POA: Diagnosis not present

## 2017-07-07 DIAGNOSIS — N186 End stage renal disease: Secondary | ICD-10-CM | POA: Diagnosis not present

## 2017-07-08 DIAGNOSIS — Z992 Dependence on renal dialysis: Secondary | ICD-10-CM | POA: Diagnosis not present

## 2017-07-08 DIAGNOSIS — I120 Hypertensive chronic kidney disease with stage 5 chronic kidney disease or end stage renal disease: Secondary | ICD-10-CM | POA: Diagnosis not present

## 2017-07-08 DIAGNOSIS — N186 End stage renal disease: Secondary | ICD-10-CM | POA: Diagnosis not present

## 2017-07-10 DIAGNOSIS — D631 Anemia in chronic kidney disease: Secondary | ICD-10-CM | POA: Diagnosis not present

## 2017-07-10 DIAGNOSIS — D509 Iron deficiency anemia, unspecified: Secondary | ICD-10-CM | POA: Diagnosis not present

## 2017-07-10 DIAGNOSIS — N186 End stage renal disease: Secondary | ICD-10-CM | POA: Diagnosis not present

## 2017-07-10 DIAGNOSIS — N2581 Secondary hyperparathyroidism of renal origin: Secondary | ICD-10-CM | POA: Diagnosis not present

## 2017-07-12 DIAGNOSIS — D631 Anemia in chronic kidney disease: Secondary | ICD-10-CM | POA: Diagnosis not present

## 2017-07-12 DIAGNOSIS — N186 End stage renal disease: Secondary | ICD-10-CM | POA: Diagnosis not present

## 2017-07-12 DIAGNOSIS — D509 Iron deficiency anemia, unspecified: Secondary | ICD-10-CM | POA: Diagnosis not present

## 2017-07-12 DIAGNOSIS — N2581 Secondary hyperparathyroidism of renal origin: Secondary | ICD-10-CM | POA: Diagnosis not present

## 2017-07-15 DIAGNOSIS — D631 Anemia in chronic kidney disease: Secondary | ICD-10-CM | POA: Diagnosis not present

## 2017-07-15 DIAGNOSIS — N2581 Secondary hyperparathyroidism of renal origin: Secondary | ICD-10-CM | POA: Diagnosis not present

## 2017-07-15 DIAGNOSIS — N186 End stage renal disease: Secondary | ICD-10-CM | POA: Diagnosis not present

## 2017-07-15 DIAGNOSIS — D509 Iron deficiency anemia, unspecified: Secondary | ICD-10-CM | POA: Diagnosis not present

## 2017-07-17 DIAGNOSIS — N186 End stage renal disease: Secondary | ICD-10-CM | POA: Diagnosis not present

## 2017-07-17 DIAGNOSIS — D509 Iron deficiency anemia, unspecified: Secondary | ICD-10-CM | POA: Diagnosis not present

## 2017-07-17 DIAGNOSIS — D631 Anemia in chronic kidney disease: Secondary | ICD-10-CM | POA: Diagnosis not present

## 2017-07-17 DIAGNOSIS — N2581 Secondary hyperparathyroidism of renal origin: Secondary | ICD-10-CM | POA: Diagnosis not present

## 2017-07-19 DIAGNOSIS — D631 Anemia in chronic kidney disease: Secondary | ICD-10-CM | POA: Diagnosis not present

## 2017-07-19 DIAGNOSIS — N2581 Secondary hyperparathyroidism of renal origin: Secondary | ICD-10-CM | POA: Diagnosis not present

## 2017-07-19 DIAGNOSIS — N186 End stage renal disease: Secondary | ICD-10-CM | POA: Diagnosis not present

## 2017-07-19 DIAGNOSIS — D509 Iron deficiency anemia, unspecified: Secondary | ICD-10-CM | POA: Diagnosis not present

## 2017-07-22 DIAGNOSIS — D509 Iron deficiency anemia, unspecified: Secondary | ICD-10-CM | POA: Diagnosis not present

## 2017-07-22 DIAGNOSIS — N186 End stage renal disease: Secondary | ICD-10-CM | POA: Diagnosis not present

## 2017-07-22 DIAGNOSIS — D631 Anemia in chronic kidney disease: Secondary | ICD-10-CM | POA: Diagnosis not present

## 2017-07-22 DIAGNOSIS — N2581 Secondary hyperparathyroidism of renal origin: Secondary | ICD-10-CM | POA: Diagnosis not present

## 2017-07-24 DIAGNOSIS — D631 Anemia in chronic kidney disease: Secondary | ICD-10-CM | POA: Diagnosis not present

## 2017-07-24 DIAGNOSIS — N186 End stage renal disease: Secondary | ICD-10-CM | POA: Diagnosis not present

## 2017-07-24 DIAGNOSIS — N2581 Secondary hyperparathyroidism of renal origin: Secondary | ICD-10-CM | POA: Diagnosis not present

## 2017-07-24 DIAGNOSIS — D509 Iron deficiency anemia, unspecified: Secondary | ICD-10-CM | POA: Diagnosis not present

## 2017-07-26 DIAGNOSIS — N186 End stage renal disease: Secondary | ICD-10-CM | POA: Diagnosis not present

## 2017-07-26 DIAGNOSIS — N2581 Secondary hyperparathyroidism of renal origin: Secondary | ICD-10-CM | POA: Diagnosis not present

## 2017-07-26 DIAGNOSIS — D631 Anemia in chronic kidney disease: Secondary | ICD-10-CM | POA: Diagnosis not present

## 2017-07-26 DIAGNOSIS — D509 Iron deficiency anemia, unspecified: Secondary | ICD-10-CM | POA: Diagnosis not present

## 2017-07-29 DIAGNOSIS — N2581 Secondary hyperparathyroidism of renal origin: Secondary | ICD-10-CM | POA: Diagnosis not present

## 2017-07-29 DIAGNOSIS — D509 Iron deficiency anemia, unspecified: Secondary | ICD-10-CM | POA: Diagnosis not present

## 2017-07-29 DIAGNOSIS — D631 Anemia in chronic kidney disease: Secondary | ICD-10-CM | POA: Diagnosis not present

## 2017-07-29 DIAGNOSIS — N186 End stage renal disease: Secondary | ICD-10-CM | POA: Diagnosis not present

## 2017-07-30 ENCOUNTER — Ambulatory Visit: Payer: Medicare Other | Admitting: Podiatry

## 2017-07-31 DIAGNOSIS — E1129 Type 2 diabetes mellitus with other diabetic kidney complication: Secondary | ICD-10-CM | POA: Diagnosis not present

## 2017-07-31 DIAGNOSIS — D631 Anemia in chronic kidney disease: Secondary | ICD-10-CM | POA: Diagnosis not present

## 2017-07-31 DIAGNOSIS — N2581 Secondary hyperparathyroidism of renal origin: Secondary | ICD-10-CM | POA: Diagnosis not present

## 2017-07-31 DIAGNOSIS — N186 End stage renal disease: Secondary | ICD-10-CM | POA: Diagnosis not present

## 2017-07-31 DIAGNOSIS — D509 Iron deficiency anemia, unspecified: Secondary | ICD-10-CM | POA: Diagnosis not present

## 2017-08-02 DIAGNOSIS — N186 End stage renal disease: Secondary | ICD-10-CM | POA: Diagnosis not present

## 2017-08-02 DIAGNOSIS — D509 Iron deficiency anemia, unspecified: Secondary | ICD-10-CM | POA: Diagnosis not present

## 2017-08-02 DIAGNOSIS — N2581 Secondary hyperparathyroidism of renal origin: Secondary | ICD-10-CM | POA: Diagnosis not present

## 2017-08-02 DIAGNOSIS — D631 Anemia in chronic kidney disease: Secondary | ICD-10-CM | POA: Diagnosis not present

## 2017-08-05 DIAGNOSIS — N186 End stage renal disease: Secondary | ICD-10-CM | POA: Diagnosis not present

## 2017-08-05 DIAGNOSIS — N2581 Secondary hyperparathyroidism of renal origin: Secondary | ICD-10-CM | POA: Diagnosis not present

## 2017-08-05 DIAGNOSIS — D509 Iron deficiency anemia, unspecified: Secondary | ICD-10-CM | POA: Diagnosis not present

## 2017-08-05 DIAGNOSIS — D631 Anemia in chronic kidney disease: Secondary | ICD-10-CM | POA: Diagnosis not present

## 2017-08-08 DIAGNOSIS — D631 Anemia in chronic kidney disease: Secondary | ICD-10-CM | POA: Diagnosis not present

## 2017-08-08 DIAGNOSIS — I120 Hypertensive chronic kidney disease with stage 5 chronic kidney disease or end stage renal disease: Secondary | ICD-10-CM | POA: Diagnosis not present

## 2017-08-08 DIAGNOSIS — N186 End stage renal disease: Secondary | ICD-10-CM | POA: Diagnosis not present

## 2017-08-08 DIAGNOSIS — Z992 Dependence on renal dialysis: Secondary | ICD-10-CM | POA: Diagnosis not present

## 2017-08-08 DIAGNOSIS — N2581 Secondary hyperparathyroidism of renal origin: Secondary | ICD-10-CM | POA: Diagnosis not present

## 2017-08-08 DIAGNOSIS — D509 Iron deficiency anemia, unspecified: Secondary | ICD-10-CM | POA: Diagnosis not present

## 2017-08-09 DIAGNOSIS — Z992 Dependence on renal dialysis: Secondary | ICD-10-CM | POA: Diagnosis not present

## 2017-08-09 DIAGNOSIS — H16122 Filamentary keratitis, left eye: Secondary | ICD-10-CM | POA: Diagnosis not present

## 2017-08-09 DIAGNOSIS — I120 Hypertensive chronic kidney disease with stage 5 chronic kidney disease or end stage renal disease: Secondary | ICD-10-CM | POA: Diagnosis not present

## 2017-08-09 DIAGNOSIS — N186 End stage renal disease: Secondary | ICD-10-CM | POA: Diagnosis not present

## 2017-08-12 DIAGNOSIS — D509 Iron deficiency anemia, unspecified: Secondary | ICD-10-CM | POA: Diagnosis not present

## 2017-08-12 DIAGNOSIS — N2581 Secondary hyperparathyroidism of renal origin: Secondary | ICD-10-CM | POA: Diagnosis not present

## 2017-08-12 DIAGNOSIS — E119 Type 2 diabetes mellitus without complications: Secondary | ICD-10-CM | POA: Diagnosis not present

## 2017-08-12 DIAGNOSIS — N186 End stage renal disease: Secondary | ICD-10-CM | POA: Diagnosis not present

## 2017-08-12 DIAGNOSIS — D631 Anemia in chronic kidney disease: Secondary | ICD-10-CM | POA: Diagnosis not present

## 2017-08-14 DIAGNOSIS — D631 Anemia in chronic kidney disease: Secondary | ICD-10-CM | POA: Diagnosis not present

## 2017-08-14 DIAGNOSIS — N2581 Secondary hyperparathyroidism of renal origin: Secondary | ICD-10-CM | POA: Diagnosis not present

## 2017-08-14 DIAGNOSIS — D509 Iron deficiency anemia, unspecified: Secondary | ICD-10-CM | POA: Diagnosis not present

## 2017-08-14 DIAGNOSIS — N186 End stage renal disease: Secondary | ICD-10-CM | POA: Diagnosis not present

## 2017-08-14 DIAGNOSIS — E119 Type 2 diabetes mellitus without complications: Secondary | ICD-10-CM | POA: Diagnosis not present

## 2017-08-16 DIAGNOSIS — D631 Anemia in chronic kidney disease: Secondary | ICD-10-CM | POA: Diagnosis not present

## 2017-08-16 DIAGNOSIS — D509 Iron deficiency anemia, unspecified: Secondary | ICD-10-CM | POA: Diagnosis not present

## 2017-08-16 DIAGNOSIS — N2581 Secondary hyperparathyroidism of renal origin: Secondary | ICD-10-CM | POA: Diagnosis not present

## 2017-08-16 DIAGNOSIS — N186 End stage renal disease: Secondary | ICD-10-CM | POA: Diagnosis not present

## 2017-08-16 DIAGNOSIS — E119 Type 2 diabetes mellitus without complications: Secondary | ICD-10-CM | POA: Diagnosis not present

## 2017-08-19 DIAGNOSIS — E119 Type 2 diabetes mellitus without complications: Secondary | ICD-10-CM | POA: Diagnosis not present

## 2017-08-19 DIAGNOSIS — D509 Iron deficiency anemia, unspecified: Secondary | ICD-10-CM | POA: Diagnosis not present

## 2017-08-19 DIAGNOSIS — D631 Anemia in chronic kidney disease: Secondary | ICD-10-CM | POA: Diagnosis not present

## 2017-08-19 DIAGNOSIS — N2581 Secondary hyperparathyroidism of renal origin: Secondary | ICD-10-CM | POA: Diagnosis not present

## 2017-08-19 DIAGNOSIS — N186 End stage renal disease: Secondary | ICD-10-CM | POA: Diagnosis not present

## 2017-08-20 ENCOUNTER — Ambulatory Visit: Payer: Medicare Other | Admitting: Podiatry

## 2017-08-21 DIAGNOSIS — N2581 Secondary hyperparathyroidism of renal origin: Secondary | ICD-10-CM | POA: Diagnosis not present

## 2017-08-21 DIAGNOSIS — D631 Anemia in chronic kidney disease: Secondary | ICD-10-CM | POA: Diagnosis not present

## 2017-08-21 DIAGNOSIS — E119 Type 2 diabetes mellitus without complications: Secondary | ICD-10-CM | POA: Diagnosis not present

## 2017-08-21 DIAGNOSIS — D509 Iron deficiency anemia, unspecified: Secondary | ICD-10-CM | POA: Diagnosis not present

## 2017-08-21 DIAGNOSIS — N186 End stage renal disease: Secondary | ICD-10-CM | POA: Diagnosis not present

## 2017-08-23 DIAGNOSIS — N2581 Secondary hyperparathyroidism of renal origin: Secondary | ICD-10-CM | POA: Diagnosis not present

## 2017-08-23 DIAGNOSIS — D631 Anemia in chronic kidney disease: Secondary | ICD-10-CM | POA: Diagnosis not present

## 2017-08-23 DIAGNOSIS — N186 End stage renal disease: Secondary | ICD-10-CM | POA: Diagnosis not present

## 2017-08-23 DIAGNOSIS — D509 Iron deficiency anemia, unspecified: Secondary | ICD-10-CM | POA: Diagnosis not present

## 2017-08-23 DIAGNOSIS — E119 Type 2 diabetes mellitus without complications: Secondary | ICD-10-CM | POA: Diagnosis not present

## 2017-08-26 DIAGNOSIS — N186 End stage renal disease: Secondary | ICD-10-CM | POA: Diagnosis not present

## 2017-08-26 DIAGNOSIS — D509 Iron deficiency anemia, unspecified: Secondary | ICD-10-CM | POA: Diagnosis not present

## 2017-08-26 DIAGNOSIS — E119 Type 2 diabetes mellitus without complications: Secondary | ICD-10-CM | POA: Diagnosis not present

## 2017-08-26 DIAGNOSIS — N2581 Secondary hyperparathyroidism of renal origin: Secondary | ICD-10-CM | POA: Diagnosis not present

## 2017-08-26 DIAGNOSIS — D631 Anemia in chronic kidney disease: Secondary | ICD-10-CM | POA: Diagnosis not present

## 2017-08-28 DIAGNOSIS — E119 Type 2 diabetes mellitus without complications: Secondary | ICD-10-CM | POA: Diagnosis not present

## 2017-08-28 DIAGNOSIS — N186 End stage renal disease: Secondary | ICD-10-CM | POA: Diagnosis not present

## 2017-08-28 DIAGNOSIS — D631 Anemia in chronic kidney disease: Secondary | ICD-10-CM | POA: Diagnosis not present

## 2017-08-28 DIAGNOSIS — N2581 Secondary hyperparathyroidism of renal origin: Secondary | ICD-10-CM | POA: Diagnosis not present

## 2017-08-28 DIAGNOSIS — D509 Iron deficiency anemia, unspecified: Secondary | ICD-10-CM | POA: Diagnosis not present

## 2017-08-30 DIAGNOSIS — D509 Iron deficiency anemia, unspecified: Secondary | ICD-10-CM | POA: Diagnosis not present

## 2017-08-30 DIAGNOSIS — E119 Type 2 diabetes mellitus without complications: Secondary | ICD-10-CM | POA: Diagnosis not present

## 2017-08-30 DIAGNOSIS — N186 End stage renal disease: Secondary | ICD-10-CM | POA: Diagnosis not present

## 2017-08-30 DIAGNOSIS — D631 Anemia in chronic kidney disease: Secondary | ICD-10-CM | POA: Diagnosis not present

## 2017-08-30 DIAGNOSIS — N2581 Secondary hyperparathyroidism of renal origin: Secondary | ICD-10-CM | POA: Diagnosis not present

## 2017-09-02 DIAGNOSIS — N186 End stage renal disease: Secondary | ICD-10-CM | POA: Diagnosis not present

## 2017-09-02 DIAGNOSIS — E119 Type 2 diabetes mellitus without complications: Secondary | ICD-10-CM | POA: Diagnosis not present

## 2017-09-02 DIAGNOSIS — N2581 Secondary hyperparathyroidism of renal origin: Secondary | ICD-10-CM | POA: Diagnosis not present

## 2017-09-02 DIAGNOSIS — D631 Anemia in chronic kidney disease: Secondary | ICD-10-CM | POA: Diagnosis not present

## 2017-09-02 DIAGNOSIS — D509 Iron deficiency anemia, unspecified: Secondary | ICD-10-CM | POA: Diagnosis not present

## 2017-09-03 ENCOUNTER — Ambulatory Visit (INDEPENDENT_AMBULATORY_CARE_PROVIDER_SITE_OTHER): Payer: Medicare Other | Admitting: Podiatry

## 2017-09-03 ENCOUNTER — Encounter: Payer: Self-pay | Admitting: Podiatry

## 2017-09-03 DIAGNOSIS — M79676 Pain in unspecified toe(s): Secondary | ICD-10-CM | POA: Diagnosis not present

## 2017-09-03 DIAGNOSIS — B351 Tinea unguium: Secondary | ICD-10-CM | POA: Diagnosis not present

## 2017-09-03 DIAGNOSIS — M2010 Hallux valgus (acquired), unspecified foot: Secondary | ICD-10-CM

## 2017-09-03 DIAGNOSIS — E1140 Type 2 diabetes mellitus with diabetic neuropathy, unspecified: Secondary | ICD-10-CM | POA: Diagnosis not present

## 2017-09-03 NOTE — Progress Notes (Signed)
Patient ID: Shawn [name], male   DOB: 25-Sep-1960, 57 y.o.   MRN: [id number] Complaint:  Visit Type: Patient returns to my office for continued preventative foot care services. Complaint: Patient states" my nails have grown long and thick and become painful to walk and wear shoes" Patient has been diagnosed with DM with neuropathy and angiopathy.. The patient presents for preventative foot care services. No changes to ROS  Podiatric Exam: Vascular: dorsalis pedis and posterior tibial pulses are not  palpable bilateral. Capillary return is immediate. Cold feet  B/L Skin turgor WNL  Sensorium: Diminished Semmes Weinstein monofilament test. Normal tactile sensation bilaterally. Nail Exam: Pt has thick disfigured discolored nails with subungual debris noted bilateral entire nail hallux through fifth toenails Ulcer Exam: There is no evidence of ulcer or pre-ulcerative changes or infection. Orthopedic Exam: Muscle tone and strength are WNL. No limitations in general ROM. No crepitus or effusions noted. Foot type and digits show no abnormalities. HAV B/L  Hammer toe 2  B/L Skin: No Porokeratosis. No infection or ulcers.    No signs of redness or infection or swelling.  Diagnosis:  Onychomycosis, , Pain in right toe, pain in left toes  Treatment & Plan Procedures and Treatment: Consent by patient was obtained for treatment procedures. The patient understood the discussion of treatment and procedures well. All questions were answered thoroughly reviewed. Debridement of mycotic and hypertrophic toenails, 1 through 5 bilateral and clearing of subungual debris. No ulceration, no infection noted. Patient says his diabetic shoes are tight on the outside.  Signed ABN for 2019. Return Visit-Office Procedure: Patient instructed to return to the office for a follow up visit 3 months for continued evaluation and treatment.  Gardiner Barefoot DPM

## 2017-09-04 DIAGNOSIS — D509 Iron deficiency anemia, unspecified: Secondary | ICD-10-CM | POA: Diagnosis not present

## 2017-09-04 DIAGNOSIS — D631 Anemia in chronic kidney disease: Secondary | ICD-10-CM | POA: Diagnosis not present

## 2017-09-04 DIAGNOSIS — N2581 Secondary hyperparathyroidism of renal origin: Secondary | ICD-10-CM | POA: Diagnosis not present

## 2017-09-04 DIAGNOSIS — N186 End stage renal disease: Secondary | ICD-10-CM | POA: Diagnosis not present

## 2017-09-04 DIAGNOSIS — E119 Type 2 diabetes mellitus without complications: Secondary | ICD-10-CM | POA: Diagnosis not present

## 2017-09-05 DIAGNOSIS — D631 Anemia in chronic kidney disease: Secondary | ICD-10-CM | POA: Diagnosis not present

## 2017-09-05 DIAGNOSIS — D509 Iron deficiency anemia, unspecified: Secondary | ICD-10-CM | POA: Diagnosis not present

## 2017-09-05 DIAGNOSIS — N2581 Secondary hyperparathyroidism of renal origin: Secondary | ICD-10-CM | POA: Diagnosis not present

## 2017-09-05 DIAGNOSIS — N186 End stage renal disease: Secondary | ICD-10-CM | POA: Diagnosis not present

## 2017-09-05 DIAGNOSIS — E119 Type 2 diabetes mellitus without complications: Secondary | ICD-10-CM | POA: Diagnosis not present

## 2017-09-06 DIAGNOSIS — I120 Hypertensive chronic kidney disease with stage 5 chronic kidney disease or end stage renal disease: Secondary | ICD-10-CM | POA: Diagnosis not present

## 2017-09-06 DIAGNOSIS — N186 End stage renal disease: Secondary | ICD-10-CM | POA: Diagnosis not present

## 2017-09-06 DIAGNOSIS — Z992 Dependence on renal dialysis: Secondary | ICD-10-CM | POA: Diagnosis not present

## 2017-09-09 DIAGNOSIS — N186 End stage renal disease: Secondary | ICD-10-CM | POA: Diagnosis not present

## 2017-09-09 DIAGNOSIS — N2581 Secondary hyperparathyroidism of renal origin: Secondary | ICD-10-CM | POA: Diagnosis not present

## 2017-09-09 DIAGNOSIS — E119 Type 2 diabetes mellitus without complications: Secondary | ICD-10-CM | POA: Diagnosis not present

## 2017-09-09 DIAGNOSIS — D509 Iron deficiency anemia, unspecified: Secondary | ICD-10-CM | POA: Diagnosis not present

## 2017-09-09 DIAGNOSIS — D631 Anemia in chronic kidney disease: Secondary | ICD-10-CM | POA: Diagnosis not present

## 2017-09-11 DIAGNOSIS — D631 Anemia in chronic kidney disease: Secondary | ICD-10-CM | POA: Diagnosis not present

## 2017-09-11 DIAGNOSIS — E119 Type 2 diabetes mellitus without complications: Secondary | ICD-10-CM | POA: Diagnosis not present

## 2017-09-11 DIAGNOSIS — D509 Iron deficiency anemia, unspecified: Secondary | ICD-10-CM | POA: Diagnosis not present

## 2017-09-11 DIAGNOSIS — N2581 Secondary hyperparathyroidism of renal origin: Secondary | ICD-10-CM | POA: Diagnosis not present

## 2017-09-11 DIAGNOSIS — N186 End stage renal disease: Secondary | ICD-10-CM | POA: Diagnosis not present

## 2017-09-13 DIAGNOSIS — D631 Anemia in chronic kidney disease: Secondary | ICD-10-CM | POA: Diagnosis not present

## 2017-09-13 DIAGNOSIS — N2581 Secondary hyperparathyroidism of renal origin: Secondary | ICD-10-CM | POA: Diagnosis not present

## 2017-09-13 DIAGNOSIS — D509 Iron deficiency anemia, unspecified: Secondary | ICD-10-CM | POA: Diagnosis not present

## 2017-09-13 DIAGNOSIS — E119 Type 2 diabetes mellitus without complications: Secondary | ICD-10-CM | POA: Diagnosis not present

## 2017-09-13 DIAGNOSIS — N186 End stage renal disease: Secondary | ICD-10-CM | POA: Diagnosis not present

## 2017-09-16 DIAGNOSIS — N2581 Secondary hyperparathyroidism of renal origin: Secondary | ICD-10-CM | POA: Diagnosis not present

## 2017-09-16 DIAGNOSIS — E119 Type 2 diabetes mellitus without complications: Secondary | ICD-10-CM | POA: Diagnosis not present

## 2017-09-16 DIAGNOSIS — D631 Anemia in chronic kidney disease: Secondary | ICD-10-CM | POA: Diagnosis not present

## 2017-09-16 DIAGNOSIS — D509 Iron deficiency anemia, unspecified: Secondary | ICD-10-CM | POA: Diagnosis not present

## 2017-09-16 DIAGNOSIS — N186 End stage renal disease: Secondary | ICD-10-CM | POA: Diagnosis not present

## 2017-09-18 DIAGNOSIS — D631 Anemia in chronic kidney disease: Secondary | ICD-10-CM | POA: Diagnosis not present

## 2017-09-18 DIAGNOSIS — E119 Type 2 diabetes mellitus without complications: Secondary | ICD-10-CM | POA: Diagnosis not present

## 2017-09-18 DIAGNOSIS — N2581 Secondary hyperparathyroidism of renal origin: Secondary | ICD-10-CM | POA: Diagnosis not present

## 2017-09-18 DIAGNOSIS — D509 Iron deficiency anemia, unspecified: Secondary | ICD-10-CM | POA: Diagnosis not present

## 2017-09-18 DIAGNOSIS — N186 End stage renal disease: Secondary | ICD-10-CM | POA: Diagnosis not present

## 2017-09-20 DIAGNOSIS — E119 Type 2 diabetes mellitus without complications: Secondary | ICD-10-CM | POA: Diagnosis not present

## 2017-09-20 DIAGNOSIS — D631 Anemia in chronic kidney disease: Secondary | ICD-10-CM | POA: Diagnosis not present

## 2017-09-20 DIAGNOSIS — N2581 Secondary hyperparathyroidism of renal origin: Secondary | ICD-10-CM | POA: Diagnosis not present

## 2017-09-20 DIAGNOSIS — D509 Iron deficiency anemia, unspecified: Secondary | ICD-10-CM | POA: Diagnosis not present

## 2017-09-20 DIAGNOSIS — N186 End stage renal disease: Secondary | ICD-10-CM | POA: Diagnosis not present

## 2017-09-23 DIAGNOSIS — D509 Iron deficiency anemia, unspecified: Secondary | ICD-10-CM | POA: Diagnosis not present

## 2017-09-23 DIAGNOSIS — D631 Anemia in chronic kidney disease: Secondary | ICD-10-CM | POA: Diagnosis not present

## 2017-09-23 DIAGNOSIS — E119 Type 2 diabetes mellitus without complications: Secondary | ICD-10-CM | POA: Diagnosis not present

## 2017-09-23 DIAGNOSIS — N2581 Secondary hyperparathyroidism of renal origin: Secondary | ICD-10-CM | POA: Diagnosis not present

## 2017-09-23 DIAGNOSIS — N186 End stage renal disease: Secondary | ICD-10-CM | POA: Diagnosis not present

## 2017-09-24 DIAGNOSIS — E113592 Type 2 diabetes mellitus with proliferative diabetic retinopathy without macular edema, left eye: Secondary | ICD-10-CM | POA: Diagnosis not present

## 2017-09-24 DIAGNOSIS — I6990 Unspecified sequelae of unspecified cerebrovascular disease: Secondary | ICD-10-CM | POA: Diagnosis not present

## 2017-09-24 DIAGNOSIS — I2510 Atherosclerotic heart disease of native coronary artery without angina pectoris: Secondary | ICD-10-CM | POA: Diagnosis not present

## 2017-09-24 DIAGNOSIS — E11319 Type 2 diabetes mellitus with unspecified diabetic retinopathy without macular edema: Secondary | ICD-10-CM | POA: Diagnosis not present

## 2017-09-24 DIAGNOSIS — E669 Obesity, unspecified: Secondary | ICD-10-CM | POA: Diagnosis not present

## 2017-09-24 DIAGNOSIS — Z79899 Other long term (current) drug therapy: Secondary | ICD-10-CM | POA: Diagnosis not present

## 2017-09-24 DIAGNOSIS — E1140 Type 2 diabetes mellitus with diabetic neuropathy, unspecified: Secondary | ICD-10-CM | POA: Diagnosis not present

## 2017-09-24 DIAGNOSIS — I119 Hypertensive heart disease without heart failure: Secondary | ICD-10-CM | POA: Diagnosis not present

## 2017-09-24 DIAGNOSIS — E559 Vitamin D deficiency, unspecified: Secondary | ICD-10-CM | POA: Diagnosis not present

## 2017-09-24 DIAGNOSIS — E7800 Pure hypercholesterolemia, unspecified: Secondary | ICD-10-CM | POA: Diagnosis not present

## 2017-09-24 DIAGNOSIS — E1165 Type 2 diabetes mellitus with hyperglycemia: Secondary | ICD-10-CM | POA: Diagnosis not present

## 2017-09-24 DIAGNOSIS — H548 Legal blindness, as defined in USA: Secondary | ICD-10-CM | POA: Diagnosis not present

## 2017-09-25 DIAGNOSIS — N186 End stage renal disease: Secondary | ICD-10-CM | POA: Diagnosis not present

## 2017-09-25 DIAGNOSIS — D509 Iron deficiency anemia, unspecified: Secondary | ICD-10-CM | POA: Diagnosis not present

## 2017-09-25 DIAGNOSIS — N2581 Secondary hyperparathyroidism of renal origin: Secondary | ICD-10-CM | POA: Diagnosis not present

## 2017-09-25 DIAGNOSIS — D631 Anemia in chronic kidney disease: Secondary | ICD-10-CM | POA: Diagnosis not present

## 2017-09-25 DIAGNOSIS — E119 Type 2 diabetes mellitus without complications: Secondary | ICD-10-CM | POA: Diagnosis not present

## 2017-09-27 DIAGNOSIS — E119 Type 2 diabetes mellitus without complications: Secondary | ICD-10-CM | POA: Diagnosis not present

## 2017-09-27 DIAGNOSIS — N2581 Secondary hyperparathyroidism of renal origin: Secondary | ICD-10-CM | POA: Diagnosis not present

## 2017-09-27 DIAGNOSIS — D509 Iron deficiency anemia, unspecified: Secondary | ICD-10-CM | POA: Diagnosis not present

## 2017-09-27 DIAGNOSIS — D631 Anemia in chronic kidney disease: Secondary | ICD-10-CM | POA: Diagnosis not present

## 2017-09-27 DIAGNOSIS — N186 End stage renal disease: Secondary | ICD-10-CM | POA: Diagnosis not present

## 2017-09-30 DIAGNOSIS — E119 Type 2 diabetes mellitus without complications: Secondary | ICD-10-CM | POA: Diagnosis not present

## 2017-09-30 DIAGNOSIS — N2581 Secondary hyperparathyroidism of renal origin: Secondary | ICD-10-CM | POA: Diagnosis not present

## 2017-09-30 DIAGNOSIS — D509 Iron deficiency anemia, unspecified: Secondary | ICD-10-CM | POA: Diagnosis not present

## 2017-09-30 DIAGNOSIS — D631 Anemia in chronic kidney disease: Secondary | ICD-10-CM | POA: Diagnosis not present

## 2017-09-30 DIAGNOSIS — N186 End stage renal disease: Secondary | ICD-10-CM | POA: Diagnosis not present

## 2017-10-02 DIAGNOSIS — D509 Iron deficiency anemia, unspecified: Secondary | ICD-10-CM | POA: Diagnosis not present

## 2017-10-02 DIAGNOSIS — D631 Anemia in chronic kidney disease: Secondary | ICD-10-CM | POA: Diagnosis not present

## 2017-10-02 DIAGNOSIS — E119 Type 2 diabetes mellitus without complications: Secondary | ICD-10-CM | POA: Diagnosis not present

## 2017-10-02 DIAGNOSIS — N2581 Secondary hyperparathyroidism of renal origin: Secondary | ICD-10-CM | POA: Diagnosis not present

## 2017-10-02 DIAGNOSIS — N186 End stage renal disease: Secondary | ICD-10-CM | POA: Diagnosis not present

## 2017-10-04 DIAGNOSIS — D509 Iron deficiency anemia, unspecified: Secondary | ICD-10-CM | POA: Diagnosis not present

## 2017-10-04 DIAGNOSIS — E119 Type 2 diabetes mellitus without complications: Secondary | ICD-10-CM | POA: Diagnosis not present

## 2017-10-04 DIAGNOSIS — N2581 Secondary hyperparathyroidism of renal origin: Secondary | ICD-10-CM | POA: Diagnosis not present

## 2017-10-04 DIAGNOSIS — N186 End stage renal disease: Secondary | ICD-10-CM | POA: Diagnosis not present

## 2017-10-04 DIAGNOSIS — D631 Anemia in chronic kidney disease: Secondary | ICD-10-CM | POA: Diagnosis not present

## 2017-10-07 DIAGNOSIS — I120 Hypertensive chronic kidney disease with stage 5 chronic kidney disease or end stage renal disease: Secondary | ICD-10-CM | POA: Diagnosis not present

## 2017-10-07 DIAGNOSIS — D509 Iron deficiency anemia, unspecified: Secondary | ICD-10-CM | POA: Diagnosis not present

## 2017-10-07 DIAGNOSIS — E119 Type 2 diabetes mellitus without complications: Secondary | ICD-10-CM | POA: Diagnosis not present

## 2017-10-07 DIAGNOSIS — N186 End stage renal disease: Secondary | ICD-10-CM | POA: Diagnosis not present

## 2017-10-07 DIAGNOSIS — N2581 Secondary hyperparathyroidism of renal origin: Secondary | ICD-10-CM | POA: Diagnosis not present

## 2017-10-07 DIAGNOSIS — Z992 Dependence on renal dialysis: Secondary | ICD-10-CM | POA: Diagnosis not present

## 2017-10-07 DIAGNOSIS — D631 Anemia in chronic kidney disease: Secondary | ICD-10-CM | POA: Diagnosis not present

## 2017-10-11 DIAGNOSIS — N186 End stage renal disease: Secondary | ICD-10-CM | POA: Diagnosis not present

## 2017-10-11 DIAGNOSIS — D631 Anemia in chronic kidney disease: Secondary | ICD-10-CM | POA: Diagnosis not present

## 2017-10-11 DIAGNOSIS — N2581 Secondary hyperparathyroidism of renal origin: Secondary | ICD-10-CM | POA: Diagnosis not present

## 2017-10-11 DIAGNOSIS — E119 Type 2 diabetes mellitus without complications: Secondary | ICD-10-CM | POA: Diagnosis not present

## 2017-10-11 DIAGNOSIS — D509 Iron deficiency anemia, unspecified: Secondary | ICD-10-CM | POA: Diagnosis not present

## 2017-10-14 DIAGNOSIS — N2581 Secondary hyperparathyroidism of renal origin: Secondary | ICD-10-CM | POA: Diagnosis not present

## 2017-10-14 DIAGNOSIS — D509 Iron deficiency anemia, unspecified: Secondary | ICD-10-CM | POA: Diagnosis not present

## 2017-10-14 DIAGNOSIS — N186 End stage renal disease: Secondary | ICD-10-CM | POA: Diagnosis not present

## 2017-10-14 DIAGNOSIS — D631 Anemia in chronic kidney disease: Secondary | ICD-10-CM | POA: Diagnosis not present

## 2017-10-14 DIAGNOSIS — E119 Type 2 diabetes mellitus without complications: Secondary | ICD-10-CM | POA: Diagnosis not present

## 2017-10-16 DIAGNOSIS — N2581 Secondary hyperparathyroidism of renal origin: Secondary | ICD-10-CM | POA: Diagnosis not present

## 2017-10-16 DIAGNOSIS — D509 Iron deficiency anemia, unspecified: Secondary | ICD-10-CM | POA: Diagnosis not present

## 2017-10-16 DIAGNOSIS — E119 Type 2 diabetes mellitus without complications: Secondary | ICD-10-CM | POA: Diagnosis not present

## 2017-10-16 DIAGNOSIS — N186 End stage renal disease: Secondary | ICD-10-CM | POA: Diagnosis not present

## 2017-10-16 DIAGNOSIS — D631 Anemia in chronic kidney disease: Secondary | ICD-10-CM | POA: Diagnosis not present

## 2017-10-18 DIAGNOSIS — D631 Anemia in chronic kidney disease: Secondary | ICD-10-CM | POA: Diagnosis not present

## 2017-10-18 DIAGNOSIS — D509 Iron deficiency anemia, unspecified: Secondary | ICD-10-CM | POA: Diagnosis not present

## 2017-10-18 DIAGNOSIS — E119 Type 2 diabetes mellitus without complications: Secondary | ICD-10-CM | POA: Diagnosis not present

## 2017-10-18 DIAGNOSIS — N186 End stage renal disease: Secondary | ICD-10-CM | POA: Diagnosis not present

## 2017-10-18 DIAGNOSIS — N2581 Secondary hyperparathyroidism of renal origin: Secondary | ICD-10-CM | POA: Diagnosis not present

## 2017-10-21 DIAGNOSIS — E119 Type 2 diabetes mellitus without complications: Secondary | ICD-10-CM | POA: Diagnosis not present

## 2017-10-21 DIAGNOSIS — N186 End stage renal disease: Secondary | ICD-10-CM | POA: Diagnosis not present

## 2017-10-21 DIAGNOSIS — D631 Anemia in chronic kidney disease: Secondary | ICD-10-CM | POA: Diagnosis not present

## 2017-10-21 DIAGNOSIS — N2581 Secondary hyperparathyroidism of renal origin: Secondary | ICD-10-CM | POA: Diagnosis not present

## 2017-10-21 DIAGNOSIS — D509 Iron deficiency anemia, unspecified: Secondary | ICD-10-CM | POA: Diagnosis not present

## 2017-10-23 DIAGNOSIS — E119 Type 2 diabetes mellitus without complications: Secondary | ICD-10-CM | POA: Diagnosis not present

## 2017-10-23 DIAGNOSIS — N2581 Secondary hyperparathyroidism of renal origin: Secondary | ICD-10-CM | POA: Diagnosis not present

## 2017-10-23 DIAGNOSIS — D631 Anemia in chronic kidney disease: Secondary | ICD-10-CM | POA: Diagnosis not present

## 2017-10-23 DIAGNOSIS — D509 Iron deficiency anemia, unspecified: Secondary | ICD-10-CM | POA: Diagnosis not present

## 2017-10-23 DIAGNOSIS — N186 End stage renal disease: Secondary | ICD-10-CM | POA: Diagnosis not present

## 2017-10-25 DIAGNOSIS — E119 Type 2 diabetes mellitus without complications: Secondary | ICD-10-CM | POA: Diagnosis not present

## 2017-10-25 DIAGNOSIS — N186 End stage renal disease: Secondary | ICD-10-CM | POA: Diagnosis not present

## 2017-10-25 DIAGNOSIS — N2581 Secondary hyperparathyroidism of renal origin: Secondary | ICD-10-CM | POA: Diagnosis not present

## 2017-10-25 DIAGNOSIS — D631 Anemia in chronic kidney disease: Secondary | ICD-10-CM | POA: Diagnosis not present

## 2017-10-25 DIAGNOSIS — D509 Iron deficiency anemia, unspecified: Secondary | ICD-10-CM | POA: Diagnosis not present

## 2017-10-28 DIAGNOSIS — D509 Iron deficiency anemia, unspecified: Secondary | ICD-10-CM | POA: Diagnosis not present

## 2017-10-28 DIAGNOSIS — N2581 Secondary hyperparathyroidism of renal origin: Secondary | ICD-10-CM | POA: Diagnosis not present

## 2017-10-28 DIAGNOSIS — D631 Anemia in chronic kidney disease: Secondary | ICD-10-CM | POA: Diagnosis not present

## 2017-10-28 DIAGNOSIS — E119 Type 2 diabetes mellitus without complications: Secondary | ICD-10-CM | POA: Diagnosis not present

## 2017-10-28 DIAGNOSIS — N186 End stage renal disease: Secondary | ICD-10-CM | POA: Diagnosis not present

## 2017-10-30 DIAGNOSIS — E119 Type 2 diabetes mellitus without complications: Secondary | ICD-10-CM | POA: Diagnosis not present

## 2017-10-30 DIAGNOSIS — D631 Anemia in chronic kidney disease: Secondary | ICD-10-CM | POA: Diagnosis not present

## 2017-10-30 DIAGNOSIS — D509 Iron deficiency anemia, unspecified: Secondary | ICD-10-CM | POA: Diagnosis not present

## 2017-10-30 DIAGNOSIS — N186 End stage renal disease: Secondary | ICD-10-CM | POA: Diagnosis not present

## 2017-10-30 DIAGNOSIS — E1129 Type 2 diabetes mellitus with other diabetic kidney complication: Secondary | ICD-10-CM | POA: Diagnosis not present

## 2017-10-30 DIAGNOSIS — N2581 Secondary hyperparathyroidism of renal origin: Secondary | ICD-10-CM | POA: Diagnosis not present

## 2017-11-01 DIAGNOSIS — D509 Iron deficiency anemia, unspecified: Secondary | ICD-10-CM | POA: Diagnosis not present

## 2017-11-01 DIAGNOSIS — N2581 Secondary hyperparathyroidism of renal origin: Secondary | ICD-10-CM | POA: Diagnosis not present

## 2017-11-01 DIAGNOSIS — E119 Type 2 diabetes mellitus without complications: Secondary | ICD-10-CM | POA: Diagnosis not present

## 2017-11-01 DIAGNOSIS — D631 Anemia in chronic kidney disease: Secondary | ICD-10-CM | POA: Diagnosis not present

## 2017-11-01 DIAGNOSIS — N186 End stage renal disease: Secondary | ICD-10-CM | POA: Diagnosis not present

## 2017-11-04 DIAGNOSIS — N2581 Secondary hyperparathyroidism of renal origin: Secondary | ICD-10-CM | POA: Diagnosis not present

## 2017-11-04 DIAGNOSIS — D509 Iron deficiency anemia, unspecified: Secondary | ICD-10-CM | POA: Diagnosis not present

## 2017-11-04 DIAGNOSIS — E119 Type 2 diabetes mellitus without complications: Secondary | ICD-10-CM | POA: Diagnosis not present

## 2017-11-04 DIAGNOSIS — D631 Anemia in chronic kidney disease: Secondary | ICD-10-CM | POA: Diagnosis not present

## 2017-11-04 DIAGNOSIS — N186 End stage renal disease: Secondary | ICD-10-CM | POA: Diagnosis not present

## 2017-11-06 DIAGNOSIS — E119 Type 2 diabetes mellitus without complications: Secondary | ICD-10-CM | POA: Diagnosis not present

## 2017-11-06 DIAGNOSIS — D631 Anemia in chronic kidney disease: Secondary | ICD-10-CM | POA: Diagnosis not present

## 2017-11-06 DIAGNOSIS — Z992 Dependence on renal dialysis: Secondary | ICD-10-CM | POA: Diagnosis not present

## 2017-11-06 DIAGNOSIS — N2581 Secondary hyperparathyroidism of renal origin: Secondary | ICD-10-CM | POA: Diagnosis not present

## 2017-11-06 DIAGNOSIS — D509 Iron deficiency anemia, unspecified: Secondary | ICD-10-CM | POA: Diagnosis not present

## 2017-11-06 DIAGNOSIS — N186 End stage renal disease: Secondary | ICD-10-CM | POA: Diagnosis not present

## 2017-11-06 DIAGNOSIS — I120 Hypertensive chronic kidney disease with stage 5 chronic kidney disease or end stage renal disease: Secondary | ICD-10-CM | POA: Diagnosis not present

## 2017-11-08 DIAGNOSIS — E119 Type 2 diabetes mellitus without complications: Secondary | ICD-10-CM | POA: Diagnosis not present

## 2017-11-08 DIAGNOSIS — D509 Iron deficiency anemia, unspecified: Secondary | ICD-10-CM | POA: Diagnosis not present

## 2017-11-08 DIAGNOSIS — D631 Anemia in chronic kidney disease: Secondary | ICD-10-CM | POA: Diagnosis not present

## 2017-11-08 DIAGNOSIS — N2581 Secondary hyperparathyroidism of renal origin: Secondary | ICD-10-CM | POA: Diagnosis not present

## 2017-11-08 DIAGNOSIS — N186 End stage renal disease: Secondary | ICD-10-CM | POA: Diagnosis not present

## 2017-11-11 DIAGNOSIS — D509 Iron deficiency anemia, unspecified: Secondary | ICD-10-CM | POA: Diagnosis not present

## 2017-11-11 DIAGNOSIS — N186 End stage renal disease: Secondary | ICD-10-CM | POA: Diagnosis not present

## 2017-11-11 DIAGNOSIS — N2581 Secondary hyperparathyroidism of renal origin: Secondary | ICD-10-CM | POA: Diagnosis not present

## 2017-11-11 DIAGNOSIS — E119 Type 2 diabetes mellitus without complications: Secondary | ICD-10-CM | POA: Diagnosis not present

## 2017-11-11 DIAGNOSIS — D631 Anemia in chronic kidney disease: Secondary | ICD-10-CM | POA: Diagnosis not present

## 2017-11-12 ENCOUNTER — Emergency Department (HOSPITAL_COMMUNITY): Payer: Medicare Other

## 2017-11-12 ENCOUNTER — Emergency Department (HOSPITAL_COMMUNITY)
Admission: EM | Admit: 2017-11-12 | Discharge: 2017-11-12 | Disposition: A | Discharge status: Home / Self Care | Payer: Medicare Other | Attending: Emergency Medicine | Admitting: Emergency Medicine

## 2017-11-12 ENCOUNTER — Encounter (HOSPITAL_COMMUNITY): Payer: Self-pay | Admitting: Emergency Medicine

## 2017-11-12 ENCOUNTER — Other Ambulatory Visit: Payer: Self-pay

## 2017-11-12 DIAGNOSIS — R110 Nausea: Secondary | ICD-10-CM | POA: Diagnosis not present

## 2017-11-12 DIAGNOSIS — S0990XA Unspecified injury of head, initial encounter: Secondary | ICD-10-CM | POA: Diagnosis not present

## 2017-11-12 DIAGNOSIS — R51 Headache: Secondary | ICD-10-CM | POA: Diagnosis not present

## 2017-11-12 DIAGNOSIS — Z5321 Procedure and treatment not carried out due to patient leaving prior to being seen by health care provider: Secondary | ICD-10-CM | POA: Diagnosis not present

## 2017-11-12 DIAGNOSIS — S199XXA Unspecified injury of neck, initial encounter: Secondary | ICD-10-CM | POA: Diagnosis not present

## 2017-11-12 NOTE — ED Notes (Signed)
Pt stated he is going to follow up with his PCP in the morning. Pt has left.

## 2017-11-12 NOTE — ED Triage Notes (Signed)
Pt presents from home with injuries to head where he rolled out of bed and struck nightstand; pt denies LOC; pt states he developed a headache and some nausea; pt has a small lac with hematoma to central forehead and small abrasion to L cheek

## 2017-11-12 NOTE — ED Notes (Signed)
Pt's family stated that the pt said he was feeling better and wanted to leave.  I encouraged them that there was a CT ordered and that they should at least have that done and allow [place] to evaluate the results before they leave.

## 2017-11-12 NOTE — ED Notes (Signed)
Patient transported to CT 

## 2017-11-13 DIAGNOSIS — E119 Type 2 diabetes mellitus without complications: Secondary | ICD-10-CM | POA: Diagnosis not present

## 2017-11-13 DIAGNOSIS — D509 Iron deficiency anemia, unspecified: Secondary | ICD-10-CM | POA: Diagnosis not present

## 2017-11-13 DIAGNOSIS — N186 End stage renal disease: Secondary | ICD-10-CM | POA: Diagnosis not present

## 2017-11-13 DIAGNOSIS — N2581 Secondary hyperparathyroidism of renal origin: Secondary | ICD-10-CM | POA: Diagnosis not present

## 2017-11-13 DIAGNOSIS — D631 Anemia in chronic kidney disease: Secondary | ICD-10-CM | POA: Diagnosis not present

## 2017-11-15 DIAGNOSIS — N2581 Secondary hyperparathyroidism of renal origin: Secondary | ICD-10-CM | POA: Diagnosis not present

## 2017-11-15 DIAGNOSIS — E119 Type 2 diabetes mellitus without complications: Secondary | ICD-10-CM | POA: Diagnosis not present

## 2017-11-15 DIAGNOSIS — D509 Iron deficiency anemia, unspecified: Secondary | ICD-10-CM | POA: Diagnosis not present

## 2017-11-15 DIAGNOSIS — D631 Anemia in chronic kidney disease: Secondary | ICD-10-CM | POA: Diagnosis not present

## 2017-11-15 DIAGNOSIS — N186 End stage renal disease: Secondary | ICD-10-CM | POA: Diagnosis not present

## 2017-11-18 DIAGNOSIS — N2581 Secondary hyperparathyroidism of renal origin: Secondary | ICD-10-CM | POA: Diagnosis not present

## 2017-11-18 DIAGNOSIS — E119 Type 2 diabetes mellitus without complications: Secondary | ICD-10-CM | POA: Diagnosis not present

## 2017-11-18 DIAGNOSIS — D631 Anemia in chronic kidney disease: Secondary | ICD-10-CM | POA: Diagnosis not present

## 2017-11-18 DIAGNOSIS — N186 End stage renal disease: Secondary | ICD-10-CM | POA: Diagnosis not present

## 2017-11-18 DIAGNOSIS — D509 Iron deficiency anemia, unspecified: Secondary | ICD-10-CM | POA: Diagnosis not present

## 2017-11-20 DIAGNOSIS — N186 End stage renal disease: Secondary | ICD-10-CM | POA: Diagnosis not present

## 2017-11-20 DIAGNOSIS — D509 Iron deficiency anemia, unspecified: Secondary | ICD-10-CM | POA: Diagnosis not present

## 2017-11-20 DIAGNOSIS — E119 Type 2 diabetes mellitus without complications: Secondary | ICD-10-CM | POA: Diagnosis not present

## 2017-11-20 DIAGNOSIS — N2581 Secondary hyperparathyroidism of renal origin: Secondary | ICD-10-CM | POA: Diagnosis not present

## 2017-11-20 DIAGNOSIS — D631 Anemia in chronic kidney disease: Secondary | ICD-10-CM | POA: Diagnosis not present

## 2017-11-22 DIAGNOSIS — N2581 Secondary hyperparathyroidism of renal origin: Secondary | ICD-10-CM | POA: Diagnosis not present

## 2017-11-22 DIAGNOSIS — E119 Type 2 diabetes mellitus without complications: Secondary | ICD-10-CM | POA: Diagnosis not present

## 2017-11-22 DIAGNOSIS — D509 Iron deficiency anemia, unspecified: Secondary | ICD-10-CM | POA: Diagnosis not present

## 2017-11-22 DIAGNOSIS — D631 Anemia in chronic kidney disease: Secondary | ICD-10-CM | POA: Diagnosis not present

## 2017-11-22 DIAGNOSIS — N186 End stage renal disease: Secondary | ICD-10-CM | POA: Diagnosis not present

## 2017-11-25 DIAGNOSIS — N2581 Secondary hyperparathyroidism of renal origin: Secondary | ICD-10-CM | POA: Diagnosis not present

## 2017-11-25 DIAGNOSIS — E119 Type 2 diabetes mellitus without complications: Secondary | ICD-10-CM | POA: Diagnosis not present

## 2017-11-25 DIAGNOSIS — N186 End stage renal disease: Secondary | ICD-10-CM | POA: Diagnosis not present

## 2017-11-25 DIAGNOSIS — D509 Iron deficiency anemia, unspecified: Secondary | ICD-10-CM | POA: Diagnosis not present

## 2017-11-25 DIAGNOSIS — D631 Anemia in chronic kidney disease: Secondary | ICD-10-CM | POA: Diagnosis not present

## 2017-11-27 DIAGNOSIS — N2581 Secondary hyperparathyroidism of renal origin: Secondary | ICD-10-CM | POA: Diagnosis not present

## 2017-11-27 DIAGNOSIS — D509 Iron deficiency anemia, unspecified: Secondary | ICD-10-CM | POA: Diagnosis not present

## 2017-11-27 DIAGNOSIS — D631 Anemia in chronic kidney disease: Secondary | ICD-10-CM | POA: Diagnosis not present

## 2017-11-27 DIAGNOSIS — E119 Type 2 diabetes mellitus without complications: Secondary | ICD-10-CM | POA: Diagnosis not present

## 2017-11-27 DIAGNOSIS — N186 End stage renal disease: Secondary | ICD-10-CM | POA: Diagnosis not present

## 2017-11-29 DIAGNOSIS — D631 Anemia in chronic kidney disease: Secondary | ICD-10-CM | POA: Diagnosis not present

## 2017-11-29 DIAGNOSIS — N186 End stage renal disease: Secondary | ICD-10-CM | POA: Diagnosis not present

## 2017-11-29 DIAGNOSIS — E119 Type 2 diabetes mellitus without complications: Secondary | ICD-10-CM | POA: Diagnosis not present

## 2017-11-29 DIAGNOSIS — D509 Iron deficiency anemia, unspecified: Secondary | ICD-10-CM | POA: Diagnosis not present

## 2017-11-29 DIAGNOSIS — N2581 Secondary hyperparathyroidism of renal origin: Secondary | ICD-10-CM | POA: Diagnosis not present

## 2017-12-02 DIAGNOSIS — D509 Iron deficiency anemia, unspecified: Secondary | ICD-10-CM | POA: Diagnosis not present

## 2017-12-02 DIAGNOSIS — D631 Anemia in chronic kidney disease: Secondary | ICD-10-CM | POA: Diagnosis not present

## 2017-12-02 DIAGNOSIS — E119 Type 2 diabetes mellitus without complications: Secondary | ICD-10-CM | POA: Diagnosis not present

## 2017-12-02 DIAGNOSIS — N186 End stage renal disease: Secondary | ICD-10-CM | POA: Diagnosis not present

## 2017-12-02 DIAGNOSIS — N2581 Secondary hyperparathyroidism of renal origin: Secondary | ICD-10-CM | POA: Diagnosis not present

## 2017-12-04 DIAGNOSIS — N2581 Secondary hyperparathyroidism of renal origin: Secondary | ICD-10-CM | POA: Diagnosis not present

## 2017-12-04 DIAGNOSIS — D631 Anemia in chronic kidney disease: Secondary | ICD-10-CM | POA: Diagnosis not present

## 2017-12-04 DIAGNOSIS — N186 End stage renal disease: Secondary | ICD-10-CM | POA: Diagnosis not present

## 2017-12-04 DIAGNOSIS — E119 Type 2 diabetes mellitus without complications: Secondary | ICD-10-CM | POA: Diagnosis not present

## 2017-12-04 DIAGNOSIS — D509 Iron deficiency anemia, unspecified: Secondary | ICD-10-CM | POA: Diagnosis not present

## 2017-12-06 DIAGNOSIS — D509 Iron deficiency anemia, unspecified: Secondary | ICD-10-CM | POA: Diagnosis not present

## 2017-12-06 DIAGNOSIS — N186 End stage renal disease: Secondary | ICD-10-CM | POA: Diagnosis not present

## 2017-12-06 DIAGNOSIS — N2581 Secondary hyperparathyroidism of renal origin: Secondary | ICD-10-CM | POA: Diagnosis not present

## 2017-12-06 DIAGNOSIS — D631 Anemia in chronic kidney disease: Secondary | ICD-10-CM | POA: Diagnosis not present

## 2017-12-06 DIAGNOSIS — E119 Type 2 diabetes mellitus without complications: Secondary | ICD-10-CM | POA: Diagnosis not present

## 2017-12-07 DIAGNOSIS — I120 Hypertensive chronic kidney disease with stage 5 chronic kidney disease or end stage renal disease: Secondary | ICD-10-CM | POA: Diagnosis not present

## 2017-12-07 DIAGNOSIS — Z992 Dependence on renal dialysis: Secondary | ICD-10-CM | POA: Diagnosis not present

## 2017-12-07 DIAGNOSIS — N186 End stage renal disease: Secondary | ICD-10-CM | POA: Diagnosis not present

## 2017-12-07 DIAGNOSIS — D631 Anemia in chronic kidney disease: Secondary | ICD-10-CM | POA: Diagnosis not present

## 2017-12-07 DIAGNOSIS — D509 Iron deficiency anemia, unspecified: Secondary | ICD-10-CM | POA: Diagnosis not present

## 2017-12-07 DIAGNOSIS — E119 Type 2 diabetes mellitus without complications: Secondary | ICD-10-CM | POA: Diagnosis not present

## 2017-12-07 DIAGNOSIS — N2581 Secondary hyperparathyroidism of renal origin: Secondary | ICD-10-CM | POA: Diagnosis not present

## 2017-12-10 ENCOUNTER — Ambulatory Visit (INDEPENDENT_AMBULATORY_CARE_PROVIDER_SITE_OTHER): Payer: Medicare Other | Admitting: Podiatry

## 2017-12-10 ENCOUNTER — Encounter: Payer: Self-pay | Admitting: Podiatry

## 2017-12-10 DIAGNOSIS — E1140 Type 2 diabetes mellitus with diabetic neuropathy, unspecified: Secondary | ICD-10-CM

## 2017-12-10 DIAGNOSIS — M79676 Pain in unspecified toe(s): Secondary | ICD-10-CM

## 2017-12-10 DIAGNOSIS — B351 Tinea unguium: Secondary | ICD-10-CM

## 2017-12-10 NOTE — Progress Notes (Signed)
Patient ID: Shawn [name], male   DOB: [birth year], 57 y.o.   MRN: [id number] Complaint:  Visit Type: Patient returns to my office for continued preventative foot care services. Complaint: Patient states" my nails have grown long and thick and become painful to walk and wear shoes" Patient has been diagnosed with DM with neuropathy and angiopathy.. The patient presents for preventative foot care services. No changes to ROS  Podiatric Exam: Vascular: dorsalis pedis and posterior tibial pulses are not  palpable bilateral. Capillary return is immediate. Cold feet  B/L Skin turgor WNL  Sensorium: Diminished Semmes Weinstein monofilament test. Normal tactile sensation bilaterally. Nail Exam: Pt has thick disfigured discolored nails with subungual debris noted bilateral entire nail hallux through fifth toenails Ulcer Exam: There is no evidence of ulcer or pre-ulcerative changes or infection. Orthopedic Exam: Muscle tone and strength are WNL. No limitations in general ROM. No crepitus or effusions noted. Foot type and digits show no abnormalities. HAV B/L  Hammer toe 2  B/L Skin: No Porokeratosis. No infection or ulcers.    No signs of redness or infection or swelling.  Diagnosis:  Onychomycosis, , Pain in right toe, pain in left toes  Treatment & Plan Procedures and Treatment: Consent by patient was obtained for treatment procedures. The patient understood the discussion of treatment and procedures well. All questions were answered thoroughly reviewed. Debridement of mycotic and hypertrophic toenails, 1 through 5 bilateral and clearing of subungual debris. No ulceration, no infection noted.   Signed ABN for 2019. Return Visit-Office Procedure: Patient instructed to return to the office for a follow up visit 3 months for continued evaluation and treatment.  Gardiner Barefoot DPM

## 2017-12-11 DIAGNOSIS — N186 End stage renal disease: Secondary | ICD-10-CM | POA: Diagnosis not present

## 2017-12-11 DIAGNOSIS — E119 Type 2 diabetes mellitus without complications: Secondary | ICD-10-CM | POA: Diagnosis not present

## 2017-12-11 DIAGNOSIS — D509 Iron deficiency anemia, unspecified: Secondary | ICD-10-CM | POA: Diagnosis not present

## 2017-12-11 DIAGNOSIS — D631 Anemia in chronic kidney disease: Secondary | ICD-10-CM | POA: Diagnosis not present

## 2017-12-11 DIAGNOSIS — N2581 Secondary hyperparathyroidism of renal origin: Secondary | ICD-10-CM | POA: Diagnosis not present

## 2017-12-12 DIAGNOSIS — H401123 Primary open-angle glaucoma, left eye, severe stage: Secondary | ICD-10-CM | POA: Diagnosis not present

## 2017-12-13 DIAGNOSIS — D509 Iron deficiency anemia, unspecified: Secondary | ICD-10-CM | POA: Diagnosis not present

## 2017-12-13 DIAGNOSIS — N2581 Secondary hyperparathyroidism of renal origin: Secondary | ICD-10-CM | POA: Diagnosis not present

## 2017-12-13 DIAGNOSIS — N186 End stage renal disease: Secondary | ICD-10-CM | POA: Diagnosis not present

## 2017-12-13 DIAGNOSIS — E119 Type 2 diabetes mellitus without complications: Secondary | ICD-10-CM | POA: Diagnosis not present

## 2017-12-13 DIAGNOSIS — D631 Anemia in chronic kidney disease: Secondary | ICD-10-CM | POA: Diagnosis not present

## 2017-12-16 DIAGNOSIS — N186 End stage renal disease: Secondary | ICD-10-CM | POA: Diagnosis not present

## 2017-12-16 DIAGNOSIS — N2581 Secondary hyperparathyroidism of renal origin: Secondary | ICD-10-CM | POA: Diagnosis not present

## 2017-12-16 DIAGNOSIS — D631 Anemia in chronic kidney disease: Secondary | ICD-10-CM | POA: Diagnosis not present

## 2017-12-16 DIAGNOSIS — D509 Iron deficiency anemia, unspecified: Secondary | ICD-10-CM | POA: Diagnosis not present

## 2017-12-16 DIAGNOSIS — E119 Type 2 diabetes mellitus without complications: Secondary | ICD-10-CM | POA: Diagnosis not present

## 2017-12-18 DIAGNOSIS — N2581 Secondary hyperparathyroidism of renal origin: Secondary | ICD-10-CM | POA: Diagnosis not present

## 2017-12-18 DIAGNOSIS — E119 Type 2 diabetes mellitus without complications: Secondary | ICD-10-CM | POA: Diagnosis not present

## 2017-12-18 DIAGNOSIS — D631 Anemia in chronic kidney disease: Secondary | ICD-10-CM | POA: Diagnosis not present

## 2017-12-18 DIAGNOSIS — N186 End stage renal disease: Secondary | ICD-10-CM | POA: Diagnosis not present

## 2017-12-18 DIAGNOSIS — D509 Iron deficiency anemia, unspecified: Secondary | ICD-10-CM | POA: Diagnosis not present

## 2017-12-20 DIAGNOSIS — N2581 Secondary hyperparathyroidism of renal origin: Secondary | ICD-10-CM | POA: Diagnosis not present

## 2017-12-20 DIAGNOSIS — D631 Anemia in chronic kidney disease: Secondary | ICD-10-CM | POA: Diagnosis not present

## 2017-12-20 DIAGNOSIS — E119 Type 2 diabetes mellitus without complications: Secondary | ICD-10-CM | POA: Diagnosis not present

## 2017-12-20 DIAGNOSIS — N186 End stage renal disease: Secondary | ICD-10-CM | POA: Diagnosis not present

## 2017-12-20 DIAGNOSIS — D509 Iron deficiency anemia, unspecified: Secondary | ICD-10-CM | POA: Diagnosis not present

## 2017-12-21 ENCOUNTER — Emergency Department (HOSPITAL_COMMUNITY): Payer: Medicare Other

## 2017-12-21 ENCOUNTER — Other Ambulatory Visit: Payer: Self-pay

## 2017-12-21 ENCOUNTER — Observation Stay (HOSPITAL_COMMUNITY)
Admission: EM | Admit: 2017-12-21 | Discharge: 2017-12-23 | Disposition: A | Discharge status: Home / Self Care | Payer: Medicare Other | Attending: Internal Medicine | Admitting: Internal Medicine

## 2017-12-21 ENCOUNTER — Encounter (HOSPITAL_COMMUNITY): Payer: Self-pay | Admitting: Emergency Medicine

## 2017-12-21 DIAGNOSIS — Z7982 Long term (current) use of aspirin: Secondary | ICD-10-CM | POA: Insufficient documentation

## 2017-12-21 DIAGNOSIS — I120 Hypertensive chronic kidney disease with stage 5 chronic kidney disease or end stage renal disease: Secondary | ICD-10-CM | POA: Insufficient documentation

## 2017-12-21 DIAGNOSIS — Z8673 Personal history of transient ischemic attack (TIA), and cerebral infarction without residual deficits: Secondary | ICD-10-CM | POA: Insufficient documentation

## 2017-12-21 DIAGNOSIS — I6350 Cerebral infarction due to unspecified occlusion or stenosis of unspecified cerebral artery: Secondary | ICD-10-CM | POA: Diagnosis not present

## 2017-12-21 DIAGNOSIS — Z79899 Other long term (current) drug therapy: Secondary | ICD-10-CM | POA: Insufficient documentation

## 2017-12-21 DIAGNOSIS — Z794 Long term (current) use of insulin: Secondary | ICD-10-CM | POA: Insufficient documentation

## 2017-12-21 DIAGNOSIS — N2581 Secondary hyperparathyroidism of renal origin: Secondary | ICD-10-CM | POA: Insufficient documentation

## 2017-12-21 DIAGNOSIS — D649 Anemia, unspecified: Secondary | ICD-10-CM | POA: Diagnosis not present

## 2017-12-21 DIAGNOSIS — R079 Chest pain, unspecified: Secondary | ICD-10-CM | POA: Diagnosis present

## 2017-12-21 DIAGNOSIS — H548 Legal blindness, as defined in USA: Secondary | ICD-10-CM | POA: Diagnosis present

## 2017-12-21 DIAGNOSIS — R0602 Shortness of breath: Secondary | ICD-10-CM | POA: Diagnosis not present

## 2017-12-21 DIAGNOSIS — R002 Palpitations: Secondary | ICD-10-CM | POA: Diagnosis not present

## 2017-12-21 DIAGNOSIS — R202 Paresthesia of skin: Secondary | ICD-10-CM

## 2017-12-21 DIAGNOSIS — N186 End stage renal disease: Secondary | ICD-10-CM | POA: Diagnosis not present

## 2017-12-21 DIAGNOSIS — Z992 Dependence on renal dialysis: Secondary | ICD-10-CM | POA: Diagnosis not present

## 2017-12-21 DIAGNOSIS — R0789 Other chest pain: Secondary | ICD-10-CM | POA: Diagnosis not present

## 2017-12-21 DIAGNOSIS — E119 Type 2 diabetes mellitus without complications: Secondary | ICD-10-CM

## 2017-12-21 DIAGNOSIS — E1122 Type 2 diabetes mellitus with diabetic chronic kidney disease: Secondary | ICD-10-CM | POA: Insufficient documentation

## 2017-12-21 DIAGNOSIS — I10 Essential (primary) hypertension: Secondary | ICD-10-CM | POA: Diagnosis present

## 2017-12-21 LAB — BASIC METABOLIC PANEL
Anion gap: 11 (ref 5–15)
BUN: 13 mg/dL (ref 6–20)
CALCIUM: 8.9 mg/dL (ref 8.9–10.3)
CO2: 31 mmol/L (ref 22–32)
Chloride: 90 mmol/L — ABNORMAL LOW (ref 101–111)
Creatinine, Ser: 6.89 mg/dL — ABNORMAL HIGH (ref 0.61–1.24)
GFR calc Af Amer: 9 mL/min — ABNORMAL LOW (ref >60)
GFR, EST NON AFRICAN AMERICAN: 8 mL/min — AB (ref >60)
GLUCOSE: 164 mg/dL — AB (ref 65–99)
Potassium: 3.8 mmol/L (ref 3.5–5.1)
SODIUM: 132 mmol/L — AB (ref 135–145)

## 2017-12-21 LAB — CBC
HCT: 38.9 % — ABNORMAL LOW (ref 39.0–52.0)
Hemoglobin: 12.3 g/dL — ABNORMAL LOW (ref 13.0–17.0)
MCH: 26.1 pg (ref 26.0–34.0)
MCHC: 31.6 g/dL (ref 30.0–36.0)
MCV: 82.6 fL (ref 78.0–100.0)
PLATELETS: 298 10*3/uL (ref 150–400)
RBC: 4.71 MIL/uL (ref 4.22–5.81)
RDW: 15 % (ref 11.5–15.5)
WBC: 5 10*3/uL (ref 4.0–10.5)

## 2017-12-21 LAB — HEMOGLOBIN A1C
HEMOGLOBIN A1C: 7.5 % — AB (ref 4.8–5.6)
Mean Plasma Glucose: 168.55 mg/dL

## 2017-12-21 LAB — I-STAT TROPONIN, ED: TROPONIN I, POC: 0 ng/mL (ref 0.00–0.08)

## 2017-12-21 LAB — GLUCOSE, CAPILLARY
GLUCOSE-CAPILLARY: 180 mg/dL — AB (ref 65–99)
Glucose-Capillary: 211 mg/dL — ABNORMAL HIGH (ref 65–99)

## 2017-12-21 MED ORDER — ONDANSETRON HCL 4 MG PO TABS: 4.0000 mg | ORAL_TABLET | Freq: Four times a day (QID) | ORAL | Status: DC | PRN

## 2017-12-21 MED ORDER — POLYVINYL ALCOHOL 1.4 % OP SOLN
1.0000 [drp] | Freq: Two times a day (BID) | OPHTHALMIC | Status: DC | PRN
  Filled 2017-12-21: qty 15

## 2017-12-21 MED ORDER — ERYTHROMYCIN 5 MG/GM OP OINT
1.0000 "application " | TOPICAL_OINTMENT | Freq: Every evening | OPHTHALMIC | Status: DC | PRN
  Filled 2017-12-21: qty 3.5

## 2017-12-21 MED ORDER — CINACALCET HCL 30 MG PO TABS
30.0000 mg | ORAL_TABLET | Freq: Every day | ORAL | Status: DC
  Administered 2017-12-21 – 2017-12-22 (×2): 30 mg via ORAL
  Filled 2017-12-21 (×2): qty 1

## 2017-12-21 MED ORDER — HEPARIN SODIUM (PORCINE) 5000 UNIT/ML IJ SOLN
5000.0000 [IU] | Freq: Three times a day (TID) | INTRAMUSCULAR | Status: DC
  Administered 2017-12-21 – 2017-12-23 (×5): 5000 [IU] via SUBCUTANEOUS
  Filled 2017-12-21 (×5): qty 1

## 2017-12-21 MED ORDER — ATORVASTATIN CALCIUM 20 MG PO TABS
20.0000 mg | ORAL_TABLET | Freq: Every day | ORAL | Status: DC
  Administered 2017-12-21 – 2017-12-22 (×2): 20 mg via ORAL
  Filled 2017-12-21 (×2): qty 1

## 2017-12-21 MED ORDER — INSULIN DETEMIR 100 UNIT/ML ~~LOC~~ SOLN
18.0000 [IU] | Freq: Two times a day (BID) | SUBCUTANEOUS | Status: DC
  Administered 2017-12-21 – 2017-12-22 (×2): 18 [IU] via SUBCUTANEOUS
  Filled 2017-12-21 (×5): qty 0.18

## 2017-12-21 MED ORDER — B COMPLEX-C PO TABS
1.0000 | ORAL_TABLET | Freq: Every day | ORAL | Status: DC
  Administered 2017-12-21 – 2017-12-22 (×2): 1 via ORAL
  Filled 2017-12-21 (×2): qty 1

## 2017-12-21 MED ORDER — LATANOPROST 0.005 % OP SOLN
1.0000 [drp] | Freq: Every day | OPHTHALMIC | Status: DC
  Administered 2017-12-21 – 2017-12-22 (×2): 1 [drp] via OPHTHALMIC
  Filled 2017-12-21: qty 2.5

## 2017-12-21 MED ORDER — VITAMIN D (ERGOCALCIFEROL) 1.25 MG (50000 UNIT) PO CAPS: 50000.0000 [IU] | ORAL_CAPSULE | ORAL | Status: DC

## 2017-12-21 MED ORDER — AMLODIPINE BESYLATE 10 MG PO TABS
10.0000 mg | ORAL_TABLET | Freq: Every day | ORAL | Status: DC
  Administered 2017-12-21 – 2017-12-22 (×2): 10 mg via ORAL
  Filled 2017-12-21 (×2): qty 1

## 2017-12-21 MED ORDER — ONDANSETRON HCL 4 MG/2ML IJ SOLN
4.0000 mg | Freq: Four times a day (QID) | INTRAMUSCULAR | Status: DC | PRN
  Administered 2017-12-21: 4 mg via INTRAVENOUS
  Filled 2017-12-21: qty 2

## 2017-12-21 MED ORDER — ACETAMINOPHEN 650 MG RE SUPP: 650.0000 mg | Freq: Four times a day (QID) | RECTAL | Status: DC | PRN

## 2017-12-21 MED ORDER — ASPIRIN EC 325 MG PO TBEC
325.0000 mg | DELAYED_RELEASE_TABLET | Freq: Every day | ORAL | Status: DC
  Administered 2017-12-22: 325 mg via ORAL
  Filled 2017-12-21: qty 1

## 2017-12-21 MED ORDER — REFRESH CONTACTS DROPS SOLN: 1.0000 [drp] | Freq: Two times a day (BID) | Status: DC | PRN

## 2017-12-21 MED ORDER — HYDRALAZINE HCL 20 MG/ML IJ SOLN: 5.0000 mg | Freq: Three times a day (TID) | INTRAMUSCULAR | Status: DC | PRN

## 2017-12-21 MED ORDER — HYDROCODONE-ACETAMINOPHEN 5-325 MG PO TABS: 1.0000 | ORAL_TABLET | ORAL | Status: DC | PRN

## 2017-12-21 MED ORDER — LANTHANUM CARBONATE 500 MG PO CHEW
1000.0000 mg | CHEWABLE_TABLET | Freq: Three times a day (TID) | ORAL | Status: DC
  Administered 2017-12-21 – 2017-12-23 (×5): 1000 mg via ORAL
  Filled 2017-12-21 (×5): qty 2

## 2017-12-21 MED ORDER — ASPIRIN 81 MG PO CHEW
324.0000 mg | CHEWABLE_TABLET | Freq: Once | ORAL | Status: AC
  Administered 2017-12-21: 324 mg via ORAL
  Filled 2017-12-21: qty 4

## 2017-12-21 MED ORDER — ACETAMINOPHEN 325 MG PO TABS: 650.0000 mg | ORAL_TABLET | Freq: Four times a day (QID) | ORAL | Status: DC | PRN

## 2017-12-21 MED ORDER — BISACODYL 10 MG RE SUPP: 10.0000 mg | Freq: Every day | RECTAL | Status: DC | PRN

## 2017-12-21 MED ORDER — SENNOSIDES-DOCUSATE SODIUM 8.6-50 MG PO TABS
1.0000 | ORAL_TABLET | Freq: Every evening | ORAL | Status: DC | PRN
Start: 2017-12-21 — End: 2017-12-23

## 2017-12-21 MED ORDER — DORZOLAMIDE HCL-TIMOLOL MAL 2-0.5 % OP SOLN
1.0000 [drp] | Freq: Two times a day (BID) | OPHTHALMIC | Status: DC
  Administered 2017-12-21 – 2017-12-23 (×4): 1 [drp] via OPHTHALMIC
  Filled 2017-12-21: qty 10

## 2017-12-21 MED ORDER — BRIMONIDINE TARTRATE 0.2 % OP SOLN
1.0000 [drp] | Freq: Two times a day (BID) | OPHTHALMIC | Status: DC
  Administered 2017-12-21 – 2017-12-23 (×4): 1 [drp] via OPHTHALMIC
  Filled 2017-12-21: qty 5

## 2017-12-21 MED ORDER — MORPHINE SULFATE (PF) 4 MG/ML IV SOLN: 1.0000 mg | INTRAVENOUS | Status: DC | PRN

## 2017-12-21 MED ORDER — INSULIN ASPART 100 UNIT/ML ~~LOC~~ SOLN: 0.0000 [IU] | Freq: Three times a day (TID) | SUBCUTANEOUS | Status: DC

## 2017-12-21 MED ORDER — B COMPLEX PO TABS: 1.0000 | ORAL_TABLET | Freq: Every day | ORAL | Status: DC

## 2017-12-21 NOTE — Progress Notes (Signed)
New Admission Note: Patient transferred to room 5M22 from [facility] ED  Arrival Method: via stretcher Mental Orientation:  Alert and oriented x 4 Telemetry: Initiated per order Assessment: Completed Skin: Intact IV: L fa SL Pain: Denies Tubes: None Safety Measures: Safety Fall Prevention Plan has been discussed  Admission: To be completed 5 [place] Orientation: Patient has been orientated to the room, unit and staff.   Family:  None at the bedside  Orders to be reviewed and implemented. Will continue to monitor the patient. Call light has been placed within reach and bed alarm has been activated.   Mady Gemma, BSN, RN-BC Phone: [phone]

## 2017-12-21 NOTE — ED Provider Notes (Signed)
[facility] EMERGENCY DEPARTMENT Provider Note   CSN: [id number] Arrival date & time: 12/21/17  1246     History   Chief Complaint Chief Complaint  Patient presents with  . Palpitations  . Shortness of Breath    HPI Shawn [name] is a 57 y.o. male.  HPI Shawn [name] is a 57 y.o. male with history of anemia, chronic kidney disease on dialysis, CVA, hypertension, diabetes, blindness, presents to emergency department with complaint of chest pain and shortness of breath.  Patient states his symptoms have been going on for several days.  He reports episodes of sensation of "heart skipping a beat and then beating really fast."  These symptoms are associated with shortness of breath and tingling in the left hand.  He states that he does get exertional shortness of breath, but these episodes of chest pain however nonexertional and random.  He states he did experience them at the end of his dialysis yesterday.  He did complete full dialysis yesterday.  He denies any nausea or vomiting.  Denies any new numbness or weakness in extremities.  Denies any prior cardiac problems.  Does not recall ever having a stress test and states he has never had a heart attack.  Past Medical History:  Diagnosis Date  . Anemia   . Arthritis    patient denies  . Blind right eye   . Chronic kidney disease    Dialysis since 2011 MWF  . Complication of anesthesia   . Diabetes mellitus    type II  . Hypertension   . Legally blind   . No pertinent past medical history   . PONV (postoperative nausea and vomiting)    N/V- became dehydrated had to have IV fluids- 01/2015  . Shortness of breath dyspnea    when has too fluid  . Stroke [facility]) 2012   date per patient  . Syncope   . Unspecified cerebral artery occlusion with cerebral infarction 04/02/2013    Patient Active Problem List   Diagnosis Date Noted  . Syncope 01/23/2016  . Hypertension 01/23/2016  . 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 Legally blind   . Diabetes ([place])   . Diabetes mellitus with complication ([place])   . Intracranial atherosclerosis 08/10/2014  . End stage renal disease ([place]) 06/12/2013  . ESRD on dialysis ([place]) 04/17/2013  . Cerebral artery occlusion with cerebral infarction ([place]) 04/02/2013  . Dizziness and giddiness 04/02/2013    Past Surgical History:  Procedure Laterality Date  . AV FISTULA PLACEMENT Right 05/12/2013   Procedure: INSERTION OF ARTERIOVENOUS (AV) GORE-TEX GRAFT ARM; ULTRASOUND GUIDED;  Surgeon: Conrad Esko, MD;  Location: [facility];  Service: Vascular;  Laterality: Right;  . AV FISTULA PLACEMENT Left 01/18/2015   Procedure: INSERTION OF ARTERIOVENOUS GORE-TEX GRAFT LEFT UPPER ARM;  Surgeon: Conrad Mineral, MD;  Location: [place];  Service: Vascular;  Laterality: Left;  . COLONOSCOPY  06/2012  . EYE SURGERY Bilateral    retina surgery both eyes, cataract surgery both eyes  . HERNIA REPAIR     right inguinal  . INSERTION OF DIALYSIS CATHETER Right   . left arm graft  10/2010  . LIGATION ARTERIOVENOUS GORTEX GRAFT Left 05/03/2015   Procedure: LIGATION ARTERIOVENOUS GORTEX GRAFT-LEFT UPPER ARM;  Surgeon: Conrad , MD;  Location: [place];  Service: Vascular;  Laterality: Left;        Home Medications    Prior to Admission medications   Medication Sig Start Date End Date  Taking? Authorizing Provider  acetaminophen (TYLENOL) 325 MG tablet Take 650 mg by mouth every 6 (six) hours as needed (pain).    [provider]  aspirin 325 MG tablet Take 325 mg by mouth daily.    [provider]  atorvastatin (LIPITOR) 20 MG tablet Take 20 mg by mouth daily.    [provider]  B Complex Vitamins (B COMPLEX-B12) TABS Take 1 tablet by mouth daily.    [provider]  cinacalcet (SENSIPAR) 30 MG tablet Take 30 mg by mouth daily with supper.    [provider]  colesevelam (WELCHOL) 625 MG tablet Take 1,250 mg by mouth daily with  supper.    [provider]  dorzolamide-timolol (COSOPT) 22.3-6.8 MG/ML ophthalmic solution Place 1 drop into the left eye 2 (two) times daily.    [provider]  insulin NPH-insulin regular (NOVOLIN 70/30) (70-30) 100 UNIT/ML injection Inject 15 Units into the skin 2 (two) times daily with a meal.    [provider]  lanthanum (FOSRENOL) 1000 MG chewable tablet Chew 1,000 mg by mouth 3 (three) times daily with meals.    [provider]  latanoprost (XALATAN) 0.005 % ophthalmic solution Place 1 drop into the left eye at bedtime.    [provider]  midodrine (PROAMATINE) 10 MG tablet Take 10 mg by mouth as needed (takes before dialysis).    [provider]    Family History Family History  Problem Relation Age of Onset  . Diabetes Mother   . Alzheimer's disease Mother   . Cancer Father   . Heart disease Father   . Cancer Sister   . Stroke Sister     Social History Social History   Tobacco Use  . Smoking status: Never Smoker  . Smokeless tobacco: Never Used  Substance Use Topics  . Alcohol use: No    Alcohol/week: 0.0 oz  . Drug use: No     Allergies   Patient has no known allergies.   Review of Systems Review of Systems  Constitutional: Negative for chills and fever.  Respiratory: Positive for chest tightness and shortness of breath. Negative for cough.   Cardiovascular: Positive for chest pain. Negative for palpitations and leg swelling.  Gastrointestinal: Negative for abdominal distention, abdominal pain, diarrhea, nausea and vomiting.  Genitourinary: Negative for dysuria, frequency, hematuria and urgency.  Musculoskeletal: Negative for arthralgias, myalgias, neck pain and neck stiffness.  Skin: Negative for rash.  Allergic/Immunologic: Negative for immunocompromised state.  Neurological: Negative for dizziness, weakness, light-headedness, numbness and headaches.  All other systems reviewed and are  negative.    Physical Exam Updated Vital Signs BP (!) 163/87 (BP Location: Right Arm)   Pulse 83   Temp 98.3 F (36.8 C) (Oral)   Resp 18   SpO2 100%   Physical Exam  Constitutional: He appears well-developed and well-nourished. No distress.  HENT:  Head: Normocephalic and atraumatic.  Eyes: Conjunctivae are normal.  Neck: Neck supple.  Cardiovascular: Normal rate, regular rhythm and normal heart sounds.  Pulmonary/Chest: Effort normal. No respiratory distress. He has no wheezes. He has no rales.  Abdominal: Soft. Bowel sounds are normal. He exhibits no distension. There is no tenderness. There is no rebound.  Musculoskeletal: He exhibits no edema.  Neurological: He is alert.  Skin: Skin is warm and dry.  Nursing note and vitals reviewed.    ED Treatments / Results  Labs (all labs ordered are listed, but only abnormal results are displayed) Labs Reviewed  BASIC METABOLIC PANEL - Abnormal; Notable for the following components:      Result Value   Sodium 132 (*)    Chloride 90 (*)    Glucose, Bld 164 (*)    Creatinine, Ser 6.89 (*)    GFR calc non Af Amer 8 (*)    GFR calc Af Amer 9 (*)    All other components within normal limits  CBC - Abnormal; Notable for the following components:   Hemoglobin 12.3 (*)    HCT 38.9 (*)    All other components within normal limits  I-STAT TROPONIN, ED    EKG EKG Interpretation  Date/Time:  Saturday December 21 2017 12:53:23 EDT Ventricular Rate:  85 PR Interval:  158 QRS Duration: 84 QT Interval:  396 QTC Calculation: 471 R Axis:   39 Text Interpretation:  Sinus rhythm with Premature atrial complexes new  T wave abnormality, consider lateral ischemia Prolonged QT Confirmed by Blanchie Dessert ([id number]) on 12/21/2017 2:25:19 PM   Radiology Dg Chest 2 View  Result Date: 12/21/2017 CLINICAL DATA:  57 year old male with a history of palpitations and left arm tingling EXAM: CHEST - 2 VIEW COMPARISON:  05/15/2016, 01/23/2016  FINDINGS: Cardiomediastinal silhouette is unchanged. Prominent soft tissue density superior to the right mainstem airway. Interval placement of right IJ tunneled hemodialysis catheter. No confluent airspace disease.  No pleural effusion or pneumothorax. No acute displaced fracture. IMPRESSION: Negative for acute cardiopulmonary disease. Interval placement of right IJ tunneled hemodialysis catheter. Soft tissue density superior to the right mainstem bronchus, again may represent lymph nodes or enlarged azygos vein. Electronically Signed   By: Corrie Mckusick D.O.   On: 12/21/2017 13:27    Procedures Procedures (including critical care time)  Medications Ordered in ED Medications  aspirin chewable tablet 324 mg (324 mg Oral Given 12/21/17 1334)     Initial Impression / Assessment and Plan / ED Course  I have reviewed the triage vital signs and the nursing notes.  Pertinent labs & imaging results that were available during my care of the patient were reviewed by me and considered in my medical decision making (see chart for details).     Pt with intermittent episodes of CP, SOB, palpitations. No hx of known ACS or arrhythmias. ECG shows new T wave inversions in alteral leads. Will get labs, including trop, CXR, monitor.    2:45 PM Initial troponin is negative.  Labs other than renal function is grossly unremarkable.  Patient has never had a cardiac work-up, last echo was in 2008.  His symptoms are certainly concerning with left-sided chest tightness and shortness of breath and pain radiating down the arm.  Symptoms are worse during dialysis which is also concerning.  Patient now volunteered some information to me that his sister and close friend died within the last 2 weeks.  States he was really close with his sister and his symptoms started shortly after she passed away suddenly while in the hospital.  Question Takotsubo syndrome.  I will admit patient to the hospital for further  evaluation.  Spoke with [facility], will admit.   Vitals:   12/21/17 1254 12/21/17 1330  BP: (!) 163/87 (!) 172/83  Pulse: 83 80  Resp: 18 17  Temp: 98.3 F (36.8 C)   TempSrc: Oral   SpO2: 100% 100%     Final Clinical Impressions(s) / ED Diagnoses   Final diagnoses:  Chest pain, unspecified type    ED Discharge Orders    None  Jeannett Senior, PA-C 12/21/17 1547    Blanchie Dessert, MD 12/22/17 1542

## 2017-12-21 NOTE — ED Triage Notes (Signed)
Patient c/o intermittent palpitations and L arm tingling x 3 days, and SOB starting yesterday. Patient denies CP or palpitations and tingling at this time, just has the shortness of breath. Patient has dialysis MWF - normal session completed yesterday but states the last 45 minutes of it, the symptoms intensified. Denies lightheadedness or N/V. Resp e/u, skin warm/dry.

## 2017-12-21 NOTE — H&P (Signed)
History and Physical    Shawn [name] KVQ:[id number] DOB: [birth year] DOA: 12/21/2017   PCP: Vincente Liberty, MD   Patient coming from:  Home    Chief Complaint: Palpitations and left arm tingling   HPI: Shawn [name] is a 57 y.o. male with medical history significant for ESRD on dialysis on Wednesday Friday followed by Dr. Hollie Salk, dialyzing at [place], hypertension, history of CVA without significant residual effects except for right eye blindness, chronic anemia, diabetes, presenting to the emergency department with several month history of palpitations, worse over the last 2 to 3 days.  The patient initially felt palpitations 6 months ago, but did not report to his PCP.  However, today, he felt his heart skipping beats, beating really fast, associated with shortness of breath, and left hand tingling.  He reports what he describes as chest tightness, but no frank chest pain.  He denies any nausea, vomiting or diaphoresis.  He denies any new numbness, or unilateral weakness.  Patient reports that the new symptoms of left arm tingling, are noted similar to the ones that he had when he had a CVA.  He denies any dysphagia or dysarthria.  No confusion is reported.  The patient is not aware of having any cardiac history.  Never has seen a cardiologist, or had a cardiac catheterization.  He does have a history of family members with cardiac disease, including, on June 9, her sister dying of cardiac arrest and a another day with the same week by a close friend.  He reports this caused significant "emotional heartbreak "on him.  His father also had cardiac disease.  Patient denies any tobacco, alcohol or recreational drug use.  He does consume significant amount of caffeine, with 2-3 sodas a day, and several cups of coffee a day, up to 5.  He denies a history of PE or DVT.  He does take an aspirin a day, but he is not in any other blood thinners.  ED Course:  BP (!) 177/84   Pulse 79   Temp 98.3  F (36.8 C) (Oral)   Resp 16   SpO2 99%   Sodium 132, potassium 3.8, bicarb 31, glucose 164, BUN 13, creatinine 6.89,. Anion gap 11, troponin 0 White count 5, hemoglobin 12.3, platelets 198 Chest x-ray negative for acute cardiopulmonary disease, right IJ tunneled hemodialysis catheter is normal. EKG questionable TWI in the lateral leads, nonspecific.  Troponin negative Known soft tissue density superior to the right mainstem bronchus, which may represent lymph nodes are not enlarged azygous vein.  Was seen already since 2012 Last 2D echo in 2008, EF 45-55   review of Systems:  As per HPI otherwise all other systems reviewed and are negative  Past Medical History:  Diagnosis Date  . Anemia   . Arthritis    patient denies  . Blind right eye   . Chronic kidney disease    Dialysis since 2011 MWF  . Complication of anesthesia   . Diabetes mellitus    type II  . Hypertension   . Legally blind   . No pertinent past medical history   . PONV (postoperative nausea and vomiting)    N/V- became dehydrated had to have IV fluids- 01/2015  . Shortness of breath dyspnea    when has too fluid  . Stroke [facility]) 2012   date per patient  . Syncope   . 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  AV FISTULA PLACEMENT Right 05/12/2013   Procedure: INSERTION OF ARTERIOVENOUS (AV) GORE-TEX GRAFT ARM; ULTRASOUND GUIDED;  Surgeon: Conrad South Hooksett, MD;  Location: [place];  Service: Vascular;  Laterality: Right;  . AV FISTULA PLACEMENT Left 01/18/2015   Procedure: INSERTION OF ARTERIOVENOUS GORE-TEX GRAFT LEFT UPPER ARM;  Surgeon: Conrad Edgewood, MD;  Location: [place];  Service: Vascular;  Laterality: Left;  . COLONOSCOPY  06/2012  . EYE SURGERY Bilateral    retina surgery both eyes, cataract surgery both eyes  . HERNIA REPAIR     right inguinal  . INSERTION OF DIALYSIS CATHETER Right   . left arm graft  10/2010  . LIGATION  ARTERIOVENOUS GORTEX GRAFT Left 05/03/2015   Procedure: LIGATION ARTERIOVENOUS GORTEX GRAFT-LEFT UPPER ARM;  Surgeon: Conrad Lynn, MD;  Location: [place];  Service: Vascular;  Laterality: Left;    Social History Social History   Socioeconomic History  . Marital status: Married    Spouse name: Not on file  . Number of children: 0  . Years of education: 12th  . Highest education level: Not on file  Occupational History    Employer: UNEMPLOYED  Social Needs  . Financial resource strain: Not on file  . Food insecurity:    Worry: Not on file    Inability: Not on file  . Transportation needs:    Medical: Not on file    Non-medical: Not on file  Tobacco Use  . Smoking status: Never Smoker  . Smokeless tobacco: Never Used  Substance and Sexual Activity  . Alcohol use: No    Alcohol/week: 0.0 oz  . Drug use: No  . Sexual activity: Not on file  Lifestyle  . Physical activity:    Days per week: Not on file    Minutes per session: Not on file  . Stress: Not on file  Relationships  . Social connections:    Talks on phone: Not on file    Gets together: Not on file    Attends religious service: Not on file    Active member of club or organization: Not on file    Attends meetings of clubs or organizations: Not on file    Relationship status: Not on file  . Intimate partner violence:    Fear of current or ex partner: Not on file    Emotionally abused: Not on file    Physically abused: Not on file    Forced sexual activity: Not on file  Other Topics Concern  . Not on file  Social History Narrative   Patient lives at home with wife.    Patient is disabled.    Patient has no children.    Patient has a 12 grade education.         No Known Allergies  Family History  Problem Relation Age of Onset  . Diabetes Mother   . Alzheimer's disease Mother   . Cancer Father   . Heart disease Father   . Cancer Sister   . Stroke Sister        Prior to Admission medications     Medication Sig Start Date End Date Taking? Authorizing Provider  acetaminophen (TYLENOL) 325 MG tablet Take 650 mg by mouth every 6 (six) hours as needed (pain).    [provider]  aspirin 325 MG tablet Take 325 mg by mouth daily.    [provider]  atorvastatin (LIPITOR) 20 MG tablet Take 20 mg by mouth daily.    [provider]  B Complex Vitamins (B COMPLEX-B12) TABS Take 1 tablet by mouth daily.    [provider]  cinacalcet (SENSIPAR) 30 MG tablet Take 30 mg by mouth daily with supper.    [provider]  colesevelam (WELCHOL) 625 MG tablet Take 1,250 mg by mouth daily with supper.    [provider]  dorzolamide-timolol (COSOPT) 22.3-6.8 MG/ML ophthalmic solution Place 1 drop into the left eye 2 (two) times daily.    [provider]  insulin NPH-insulin regular (NOVOLIN 70/30) (70-30) 100 UNIT/ML injection Inject 15 Units into the skin 2 (two) times daily with a meal.    [provider]  lanthanum (FOSRENOL) 1000 MG chewable tablet Chew 1,000 mg by mouth 3 (three) times daily with meals.    [provider]  latanoprost (XALATAN) 0.005 % ophthalmic solution Place 1 drop into the left eye at bedtime.    [provider]  midodrine (PROAMATINE) 10 MG tablet Take 10 mg by mouth as needed (takes before dialysis).    [provider]     Physical Exam:  Vitals:   12/21/17 1400 12/21/17 1415 12/21/17 1430 12/21/17 1445  BP: (!) 171/81 (!) 171/84 (!) 173/86 (!) 177/84  Pulse: 76 78 81 79  Resp: 18 12 15 16   Temp:      TempSrc:      SpO2: 100% 97% 98% 99%   Constitutional: NAD, calm, comfortable evaluation  eyes: Known right eye blindness, lids and conjunctiva normal  ENMT: Mucous membranes are moist, without exudate or lesions  Neck: normal, supple, no masses, no thyromegaly Respiratory: clear to auscultation bilaterally, no wheezing, no crackles. Normal respiratory effort   Cardiovascular: Regular rate and rhythm, no murmur, rubs or gallops. No extremity edema. 2+ pedal pulses. No carotid bruits.  Abdomen: Soft, obese non tender, No hepatosplenomegaly. Bowel sounds positive.  Musculoskeletal: no clubbing / cyanosis. Moves all extremities Skin: no jaundice, No lesions.  Neurologic: Sensation intact  Strength equal in all extremities, barely right lower extremity has slight weakness when compared to left Psychiatric:   Alert and oriented x 3. Normal mood.     Labs on Admission: I have personally reviewed following labs and imaging studies  CBC: Recent Labs  Lab 12/21/17 1302  WBC 5.0  HGB 12.3*  HCT 38.9*  MCV 82.6  PLT 338    Basic Metabolic Panel: Recent Labs  Lab 12/21/17 1302  NA 132*  K 3.8  CL 90*  CO2 31  GLUCOSE 164*  BUN 13  CREATININE 6.89*  CALCIUM 8.9    GFR: CrCl cannot be calculated (Unknown ideal weight.).  Liver Function Tests: No results for input(s): AST, ALT, ALKPHOS, BILITOT, PROT, ALBUMIN in the last 168 hours. No results for input(s): LIPASE, AMYLASE in the last 168 hours. No results for input(s): AMMONIA in the last 168 hours.  Coagulation Profile: No results for input(s): INR, PROTIME in the last 168 hours.  Cardiac Enzymes: No results for input(s): CKTOTAL, CKMB, CKMBINDEX, TROPONINI in the last 168 hours.  BNP (last 3 results) No results for input(s): PROBNP in the last 8760 hours.  HbA1C: No results for input(s): HGBA1C in the last 72 hours.  CBG: No results for input(s): GLUCAP in the last 168 hours.  Lipid Profile: No results for input(s): CHOL, HDL, LDLCALC, TRIG, CHOLHDL, LDLDIRECT in the last 72 hours.  Thyroid Function Tests: No results for input(s): TSH, T4TOTAL, FREET4, T3FREE, THYROIDAB in the last 72 hours.  Anemia Panel: No results  for input(s): VITAMINB12, FOLATE, FERRITIN, TIBC, IRON, RETICCTPCT in the last 72 hours.  Urine analysis:    Component Value Date/Time   COLORURINE  YELLOW 01/25/2010 1953   APPEARANCEUR CLOUDY (A) 01/25/2010 1953   LABSPEC 1.017 01/25/2010 1953   PHURINE 5.5 01/25/2010 1953   GLUCOSEU 100 (A) 01/25/2010 1953   HGBUR SMALL (A) 01/25/2010 [place] NEGATIVE 01/25/2010 [place] NEGATIVE 01/25/2010 1953   PROTEINUR >300 (A) 01/25/2010 1953   UROBILINOGEN 0.2 01/25/2010 1953   NITRITE NEGATIVE 01/25/2010 1953   LEUKOCYTESUR NEGATIVE 01/25/2010 1953    Sepsis Labs: @LABRCNTIP (procalcitonin:4,lacticidven:4) )No results found for this or any previous visit (from the past 240 hour(s)).   Radiological Exams on Admission: Dg Chest 2 View  Result Date: 12/21/2017 CLINICAL DATA:  57 year old male with a history of palpitations and left arm tingling EXAM: CHEST - 2 VIEW COMPARISON:  05/15/2016, 01/23/2016 FINDINGS: Cardiomediastinal silhouette is unchanged. Prominent soft tissue density superior to the right mainstem airway. Interval placement of right IJ tunneled hemodialysis catheter. No confluent airspace disease.  No pleural effusion or pneumothorax. No acute displaced fracture. IMPRESSION: Negative for acute cardiopulmonary disease. Interval placement of right IJ tunneled hemodialysis catheter. Soft tissue density superior to the right mainstem bronchus, again may represent lymph nodes or enlarged azygos vein. Electronically Signed   By: Corrie Mckusick D.O.   On: 12/21/2017 13:27    EKG: Independently reviewed.  Assessment/Plan Principal Problem:   Palpitations Active Problems:   Tingling of left upper extremity   Cerebral artery occlusion with cerebral infarction (HCC)   ESRD on dialysis [facility])   Legally blind   Hypertension   Diabetes ([place])   Sodium 132 (corrected 134)  potassium 3.8, bicarb 31, glucose 164, BUN 13, creatinine 6.89,. Anion gap 11, troponin 0 White count 5, hemoglobin 12.3, platelets 198 Test x-ray negative for acute cardiopulmonary disease, right IJ tunneled hemodialysis catheter is normal. Known  soft tissue density superior to the right mainstem bronchus, which may represent lymph nodes are not enlarged azygous vein.  Was seen already since 2012 Last 2D echo in 2008, EF 45-55  Palpitations, with left arm tingling, in a patient with no prior cardiac disease.  EKG with non specific TWI in lateral leads  and troponin are unremarkable.  Chest x-ray nondiagnostic.  Patient has history of family cardiac disease, with his sibling having had died of a cardiac arrest less than 1 week ago (?[place]), and father also has cardiac disease. In addition, the patient does consume significant amount of caffeine.  No tobacco, alcohol or recreational drug use.  Risk factors include hypertension, diabetes, obesity, prior history of stroke, age.   Since the afib onset was within 48 hours, cardioversion was/will be performed. Telemetry observation Aspirin daily EKG in the morning 2 D echo  Lipid panel  TSH/free T4 Patient has been instructed of the importance of decreasing his caffeine intake as well. He may benefit from Holter monitor as an outpatient, cardiology evaluation as an outpatient, letter is the symptoms become worse during this hospitalization, at which time will obtain cards consultation.  He is chest pain-free at this time.  ESRD on HD MWF , Cr 6.89, dialyzing at [place].  Dr. Hollie Salk is his nephrologist Will notify nephrology of the patient's admission, if he remains in the hospital by tomorrow, needing dialysis on Monday.  Otherwise, can follow with nephrology as an outpatient Renal Diet.  Renal vitamins BMET in am  Type II Diabetes Current blood sugar level  is 164  Lab Results  Component Value Date   HGBA1C 8.3 (H) 01/23/2016  Hgb A1C Levemir , SSI  History of CVA, and right eye legally blind Continue eyedrops Continue aspirin  DVT prophylaxis:  Heparin sq  Code Status:    Limited, DNI  Family Communication:  Discussed with patient Disposition Plan: Expect patient to be discharged  to home after condition improves Consults called:    None  Admission status: Tele Obs    Sharene Butters, PA-C [facility]   Amion text  [phone]   12/21/2017, 4:27 PM

## 2017-12-22 ENCOUNTER — Observation Stay (HOSPITAL_BASED_OUTPATIENT_CLINIC_OR_DEPARTMENT_OTHER): Payer: Medicare Other

## 2017-12-22 DIAGNOSIS — R079 Chest pain, unspecified: Secondary | ICD-10-CM

## 2017-12-22 DIAGNOSIS — Z992 Dependence on renal dialysis: Secondary | ICD-10-CM | POA: Diagnosis not present

## 2017-12-22 DIAGNOSIS — Z79899 Other long term (current) drug therapy: Secondary | ICD-10-CM | POA: Diagnosis not present

## 2017-12-22 DIAGNOSIS — E1122 Type 2 diabetes mellitus with diabetic chronic kidney disease: Secondary | ICD-10-CM | POA: Diagnosis not present

## 2017-12-22 DIAGNOSIS — R002 Palpitations: Secondary | ICD-10-CM

## 2017-12-22 DIAGNOSIS — N2581 Secondary hyperparathyroidism of renal origin: Secondary | ICD-10-CM | POA: Diagnosis not present

## 2017-12-22 DIAGNOSIS — I6350 Cerebral infarction due to unspecified occlusion or stenosis of unspecified cerebral artery: Secondary | ICD-10-CM | POA: Diagnosis not present

## 2017-12-22 DIAGNOSIS — Z794 Long term (current) use of insulin: Secondary | ICD-10-CM | POA: Diagnosis not present

## 2017-12-22 DIAGNOSIS — R0789 Other chest pain: Secondary | ICD-10-CM | POA: Diagnosis not present

## 2017-12-22 DIAGNOSIS — Z7982 Long term (current) use of aspirin: Secondary | ICD-10-CM | POA: Diagnosis not present

## 2017-12-22 DIAGNOSIS — I120 Hypertensive chronic kidney disease with stage 5 chronic kidney disease or end stage renal disease: Secondary | ICD-10-CM | POA: Diagnosis not present

## 2017-12-22 DIAGNOSIS — N186 End stage renal disease: Secondary | ICD-10-CM | POA: Diagnosis not present

## 2017-12-22 DIAGNOSIS — D649 Anemia, unspecified: Secondary | ICD-10-CM | POA: Diagnosis not present

## 2017-12-22 DIAGNOSIS — Z8673 Personal history of transient ischemic attack (TIA), and cerebral infarction without residual deficits: Secondary | ICD-10-CM | POA: Diagnosis not present

## 2017-12-22 LAB — CBC
HCT: 38 % — ABNORMAL LOW (ref 39.0–52.0)
Hemoglobin: 12 g/dL — ABNORMAL LOW (ref 13.0–17.0)
MCH: 25.9 pg — AB (ref 26.0–34.0)
MCHC: 31.6 g/dL (ref 30.0–36.0)
MCV: 82.1 fL (ref 78.0–100.0)
PLATELETS: 293 10*3/uL (ref 150–400)
RBC: 4.63 MIL/uL (ref 4.22–5.81)
RDW: 14.7 % (ref 11.5–15.5)
WBC: 5.2 10*3/uL (ref 4.0–10.5)

## 2017-12-22 LAB — BASIC METABOLIC PANEL
Anion gap: 13 (ref 5–15)
BUN: 21 mg/dL — AB (ref 6–20)
CALCIUM: 8.4 mg/dL — AB (ref 8.9–10.3)
CHLORIDE: 89 mmol/L — AB (ref 101–111)
CO2: 30 mmol/L (ref 22–32)
CREATININE: 8.2 mg/dL — AB (ref 0.61–1.24)
GFR calc Af Amer: 7 mL/min — ABNORMAL LOW (ref >60)
GFR calc non Af Amer: 6 mL/min — ABNORMAL LOW (ref >60)
GLUCOSE: 79 mg/dL (ref 65–99)
Potassium: 3.9 mmol/L (ref 3.5–5.1)
Sodium: 132 mmol/L — ABNORMAL LOW (ref 135–145)

## 2017-12-22 LAB — ECHOCARDIOGRAM COMPLETE: WEIGHTICAEL: 3421.54 [oz_av]

## 2017-12-22 LAB — GLUCOSE, CAPILLARY
GLUCOSE-CAPILLARY: 55 mg/dL — AB (ref 65–99)
GLUCOSE-CAPILLARY: 76 mg/dL (ref 65–99)
Glucose-Capillary: 153 mg/dL — ABNORMAL HIGH (ref 65–99)
Glucose-Capillary: 148 mg/dL — ABNORMAL HIGH (ref 65–99)
Glucose-Capillary: 130 mg/dL — ABNORMAL HIGH (ref 65–99)

## 2017-12-22 LAB — TROPONIN I: Troponin I: <0.03 ng/mL (ref <0.03)

## 2017-12-22 NOTE — Progress Notes (Signed)
CBG: 56  Treatment: 15 GM carbohydrate snack  Symptoms: None  Follow-up CBG: [id number] CBG Result:76  Possible Reasons for Event: Inadequate meal intake and Unknown   Laurance Flatten, [place]

## 2017-12-22 NOTE — Progress Notes (Signed)
[facility] PROGRESS NOTE    Progress Note  Shawn [name]  EUM:[id number] DOB: [birth year] DOA: 12/21/2017 PCP: Vincente Liberty, MD     Brief Narrative:   Shawn [name] is an 57 y.o. male past medical history of end-stage renal disease who dialyzes Monday Wednesday and Friday who comes into the ED for palpitation of the last 6 months he relates some mild chest tightness but no chest pain shortness of breath nausea vomiting diaphoresis.  Assessment/Plan:   Atypical chest pain/Palpitations: For set of cardiac biomarkers negative.  Next line twelve-lead EKG T wave inversions in inferior leads and lateral leads. Set of cardiac biomarkers negative we will continue to cycle troponins 2D echo is pending  Cerebral artery occlusion with cerebral infarction [facility]): Aspirin asymptomatic.  ESRD on dialysis [facility]): Dialyzes Monday Wednesdays and Fridays. New renal diet.  Diabetes mellitus type 2: Continue Levemir plus sliding scale.  Legally blind  DVT prophylaxis: lovenox Family Communication:wife Disposition Plan/Barrier to D/C: home in am Code Status:     Code Status Orders  (From admission, onward)        Start     Ordered   12/21/17 1649  Limited resuscitation (code)  Continuous    Question Answer Comment  In the event of cardiac or respiratory ARREST: Initiate Code Blue, Call Rapid Response Yes   In the event of cardiac or respiratory ARREST: Perform CPR Yes   In the event of cardiac or respiratory ARREST: Perform Intubation/Mechanical Ventilation No   In the event of cardiac or respiratory ARREST: Use NIPPV/BiPAp only if indicated Yes   In the event of cardiac or respiratory ARREST: Administer ACLS medications if indicated Yes   In the event of cardiac or respiratory ARREST: Perform Defibrillation or Cardioversion if indicated Yes      12/21/17 1652    Code Status History    Date Active Date Inactive Code Status Order ID Comments User Context   01/23/2016 1521 01/24/2016 1708 Full Code [id number]  Radene Gunning, NP ED        IV Access:    Peripheral IV   Procedures and diagnostic studies:   Dg Chest 2 View  Result Date: 12/21/2017 CLINICAL DATA:  57 year old male with a history of palpitations and left arm tingling EXAM: CHEST - 2 VIEW COMPARISON:  05/15/2016, 01/23/2016 FINDINGS: Cardiomediastinal silhouette is unchanged. Prominent soft tissue density superior to the right mainstem airway. Interval placement of right IJ tunneled hemodialysis catheter. No confluent airspace disease.  No pleural effusion or pneumothorax. No acute displaced fracture. IMPRESSION: Negative for acute cardiopulmonary disease. Interval placement of right IJ tunneled hemodialysis catheter. Soft tissue density superior to the right mainstem bronchus, again may represent lymph nodes or enlarged azygos vein. Electronically Signed   By: Corrie Mckusick D.O.   On: 12/21/2017 13:27     Medical Consultants:    None.  Anti-Infectives:   None  Subjective:    Shawn [name] no further chest pain  Objective:    Vitals:   12/21/17 1838 12/21/17 2035 12/22/17 0621 12/22/17 0744  BP: (!) 183/82 (!) 170/77 (!) 174/77 (!) 162/72  Pulse: 87 81 69 66  Resp:  (!) 22 16 16   Temp: 98.1 F (36.7 C) 98 F (36.7 C) 97.8 F (36.6 C) 98 F (36.7 C)  TempSrc: Oral Oral Oral Oral  SpO2: 100% 100% 97% 96%  Weight:  97 kg (213 lb 13.5 oz)      Intake/Output Summary (Last 24 hours) at 12/22/2017 1015 Last data  filed at 12/22/2017 0600 Gross per 24 hour  Intake 0 ml  Output 0 ml  Net 0 ml   Filed Weights   12/21/17 2035  Weight: 97 kg (213 lb 13.5 oz)    Exam: General exam: In no acute distress. Respiratory system: Good air movement and clear to auscultation. Cardiovascular system: S1 & S2 heard, RRR.  Gastrointestinal system: Abdomen is nondistended, soft and nontender.  Central nervous system: Alert and oriented. No focal neurological  deficits. Extremities: No pedal edema. Skin: No rashes, lesions or ulcers Psychiatry: Judgement and insight appear normal. Mood & affect appropriate.    Data Reviewed:    Labs: Basic Metabolic Panel: Recent Labs  Lab 12/21/17 1302  NA 132*  K 3.8  CL 90*  CO2 31  GLUCOSE 164*  BUN 13  CREATININE 6.89*  CALCIUM 8.9   GFR Estimated Creatinine Clearance: 14.2 mL/min (A) (by C-G formula based on SCr of 6.89 mg/dL (H)). Liver Function Tests: No results for input(s): AST, ALT, ALKPHOS, BILITOT, PROT, ALBUMIN in the last 168 hours. No results for input(s): LIPASE, AMYLASE in the last 168 hours. No results for input(s): AMMONIA in the last 168 hours. Coagulation profile No results for input(s): INR, PROTIME in the last 168 hours.  CBC: Recent Labs  Lab 12/21/17 1302 12/22/17 0852  WBC 5.0 5.2  HGB 12.3* 12.0*  HCT 38.9* 38.0*  MCV 82.6 82.1  PLT 298 293   Cardiac Enzymes: No results for input(s): CKTOTAL, CKMB, CKMBINDEX, TROPONINI in the last 168 hours. BNP (last 3 results) No results for input(s): PROBNP in the last 8760 hours. CBG: Recent Labs  Lab 12/21/17 1834 12/21/17 2037 12/22/17 0745 12/22/17 0834  GLUCAP 180* 211* 55* 76   D-Dimer: No results for input(s): DDIMER in the last 72 hours. Hgb A1c: Recent Labs    12/21/17 1714  HGBA1C 7.5*   Lipid Profile: No results for input(s): CHOL, HDL, LDLCALC, TRIG, CHOLHDL, LDLDIRECT in the last 72 hours. Thyroid function studies: No results for input(s): TSH, T4TOTAL, T3FREE, THYROIDAB in the last 72 hours.  Invalid input(s): FREET3 Anemia work up: No results for input(s): VITAMINB12, FOLATE, FERRITIN, TIBC, IRON, RETICCTPCT in the last 72 hours. Sepsis Labs: Recent Labs  Lab 12/21/17 1302 12/22/17 0852  WBC 5.0 5.2   Microbiology No results found for this or any previous visit (from the past 240 hour(s)).   Medications:   . amLODipine  10 mg Oral QHS  . aspirin EC  325 mg Oral QHS  .  atorvastatin  20 mg Oral QHS  . B-complex with vitamin C  1 tablet Oral QHS  . brimonidine  1 drop Left Eye BID  . cinacalcet  30 mg Oral Q supper  . dorzolamide-timolol  1 drop Left Eye BID  . heparin  5,000 Units Subcutaneous Q8H  . insulin aspart  0-9 Units Subcutaneous TID WC  . insulin detemir  18 Units Subcutaneous BID WC  . lanthanum  1,000 mg Oral TID WC  . latanoprost  1 drop Left Eye QHS  . [START ON 12/26/2017] Vitamin D (Ergocalciferol)  50,000 Units Oral Q Thu   Continuous Infusions:    LOS: 0 days   [facility] Pager [phone]  *Please refer to [email or web address], password [email or web address] to get updated schedule on who will round on this patient, as hospitalists switch teams weekly. If 7PM-7AM, please contact night-coverage at [email or web address], password [email or web address] for any overnight needs.  12/22/2017, 10:15 AM

## 2017-12-22 NOTE — Progress Notes (Signed)
  Echocardiogram 2D Echocardiogram has been performed.  Shruti Arrey L Androw 12/22/2017, 2:13 PM

## 2017-12-23 DIAGNOSIS — D509 Iron deficiency anemia, unspecified: Secondary | ICD-10-CM | POA: Diagnosis not present

## 2017-12-23 DIAGNOSIS — R079 Chest pain, unspecified: Secondary | ICD-10-CM | POA: Diagnosis not present

## 2017-12-23 DIAGNOSIS — N186 End stage renal disease: Secondary | ICD-10-CM | POA: Diagnosis not present

## 2017-12-23 DIAGNOSIS — R0789 Other chest pain: Secondary | ICD-10-CM | POA: Diagnosis not present

## 2017-12-23 DIAGNOSIS — D631 Anemia in chronic kidney disease: Secondary | ICD-10-CM | POA: Diagnosis not present

## 2017-12-23 DIAGNOSIS — R002 Palpitations: Secondary | ICD-10-CM | POA: Diagnosis not present

## 2017-12-23 DIAGNOSIS — Z992 Dependence on renal dialysis: Secondary | ICD-10-CM

## 2017-12-23 DIAGNOSIS — N2581 Secondary hyperparathyroidism of renal origin: Secondary | ICD-10-CM | POA: Diagnosis not present

## 2017-12-23 DIAGNOSIS — E119 Type 2 diabetes mellitus without complications: Secondary | ICD-10-CM | POA: Diagnosis not present

## 2017-12-23 LAB — MRSA PCR SCREENING: MRSA by PCR: NEGATIVE

## 2017-12-23 LAB — GLUCOSE, CAPILLARY
GLUCOSE-CAPILLARY: 70 mg/dL (ref 65–99)
Glucose-Capillary: 48 mg/dL — ABNORMAL LOW (ref 65–99)

## 2017-12-23 NOTE — Progress Notes (Signed)
  [facility] Brief Consult Note   Subjective:   Pt admitted as OBS status on 6/15 with heart palpitations and L extremity numbness/tingling. Cardiac work-up negative. Ready for discharge today - will be transported to his usual HD unit for HD this afternoon.  Objective Vitals:   12/22/17 1731 12/22/17 2104 12/23/17 0701 12/23/17 0930  BP: (!) 173/80 (!) 161/69 (!) 144/63 (!) 162/74  Pulse: 73 74 69 76  Resp: 16 16 16 18   Temp: 98.1 F (36.7 C) 98 F (36.7 C) 98.1 F (36.7 C) 98 F (36.7 C)  TempSrc: Oral Oral Oral Oral  SpO2: 97% 100% 98% 97%  Weight:  98.9 kg (218 lb 0.6 oz)     Physical Exam General: Well appearing, NAD Heart: RRR; no murmur Lungs: CTAB Abdomen: soft, non-tender Extremities: No LE edema Dialysis Access: permanent R [facility]  Additional Objective Labs: Basic Metabolic Panel: Recent Labs  Lab 12/21/17 1302 12/22/17 0852  NA 132* 132*  K 3.8 3.9  CL 90* 89*  CO2 31 30  GLUCOSE 164* 79  BUN 13 21*  CREATININE 6.89* 8.20*  CALCIUM 8.9 8.4*   CBC: Recent Labs  Lab 12/21/17 1302 12/22/17 0852  WBC 5.0 5.2  HGB 12.3* 12.0*  HCT 38.9* 38.0*  MCV 82.6 82.1  PLT 298 293   Cardiac Enzymes: Recent Labs  Lab 12/22/17 1018 12/22/17 1557 12/22/17 2218  TROPONINI <0.03 <0.03 <0.03   Studies/Results: Dg Chest 2 View  Result Date: 12/21/2017 CLINICAL DATA:  57 year old male with a history of palpitations and left arm tingling EXAM: CHEST - 2 VIEW COMPARISON:  05/15/2016, 01/23/2016 FINDINGS: Cardiomediastinal silhouette is unchanged. Prominent soft tissue density superior to the right mainstem airway. Interval placement of right IJ tunneled hemodialysis catheter. No confluent airspace disease.  No pleural effusion or pneumothorax. No acute displaced fracture. IMPRESSION: Negative for acute cardiopulmonary disease. Interval placement of right IJ tunneled hemodialysis catheter. Soft tissue density superior to the right mainstem bronchus,  again may represent lymph nodes or enlarged azygos vein. Electronically Signed   By: Corrie Mckusick D.O.   On: 12/21/2017 13:27   Medications:  . amLODipine  10 mg Oral QHS  . aspirin EC  325 mg Oral QHS  . atorvastatin  20 mg Oral QHS  . B-complex with vitamin C  1 tablet Oral QHS  . brimonidine  1 drop Left Eye BID  . cinacalcet  30 mg Oral Q supper  . dorzolamide-timolol  1 drop Left Eye BID  . heparin  5,000 Units Subcutaneous Q8H  . insulin aspart  0-9 Units Subcutaneous TID WC  . insulin detemir  18 Units Subcutaneous BID WC  . lanthanum  1,000 mg Oral TID WC  . latanoprost  1 drop Left Eye QHS  . [START ON 12/26/2017] Vitamin D (Ergocalciferol)  50,000 Units Oral Q Thu    Assessment/Plan: 1. Heart palpitations: Normal cardiac enzymes. Echo with LVEF 45-50% with dilated IVC. Will plan to reduce outpatient EDW to see if can improve this. He had been very stressed leading up to admit, sister passed away, unclear if that triggered his symptoms. 2. ESRD: Due for HD today, will be done at usual outpatient unit at 1pm. SW and hospitalist aware. 3. HTN/volume: See above, lowering EDW. 4. Anemia: Hgb 12. No ESA for now. 5. Secondary hyperparathyroidism:  Ca ok. Continue home binders/sensipar.  Veneta Penton, PA-C 12/23/2017, 11:27 AM  [facility] Pager: [phone]

## 2017-12-23 NOTE — Discharge Summary (Addendum)
Physician Discharge Summary  Shawn [name] ERD:[id number] DOB: [birth year] DOA: 12/21/2017  PCP: Vincente Liberty, MD  Admit date: 12/21/2017 Discharge date: 12/23/2017  Admitted From: home Discharge disposition: home   Recommendations for Outpatient Follow-Up:   1. Referral to cardiology for 30 day holter monitor to r/o a fib-- none seen on tele in hospital and follow up for possible stress test   Discharge Diagnosis:   Principal Problem:   Palpitations Active Problems:   Cerebral artery occlusion with cerebral infarction ([place])   ESRD on dialysis [facility])   Legally blind   Hypertension   Diabetes ([place])   Tingling of left upper extremity    Discharge Condition: Improved.  Diet recommendation: renal diet  Wound care: None.  Code status: Full.   History of Present Illness:   57 year old extremely pleasant blind end-stage renal disease patient on hemodialysis presents after his sister and a dear friend both died last week.  He is having left arm tingling and palpitations.  He will be admitted to rule out for myocardial infarction we will check an echocardiogram.  I believe most of this is a grief reaction if he rules out and work-up is negative will likely be stable for discharge home tomorrow.   Hospital Course by Problem:   Atypical chest pain/Palpitations: CE negative 2D echo: - Left ventricle: The cavity size was normal. There was mild   concentric hypertrophy. Systolic function was mildly reduced. The   estimated ejection fraction was in the range of 45% to 50%. Mild   diffuse hypokinesis. Doppler parameters are consistent with   abnormal left ventricular relaxation (grade 1 diastolic   dysfunction). Doppler parameters are consistent with high   ventricular filling pressure.  Cerebral artery occlusion with cerebral infarction [facility]): Aspirin  ESRD on dialysis [facility]): Dialyzes Monday Wednesdays and Fridays. New renal diet.  Diabetes mellitus  type 2: Continue Levemir plus sliding scale. -needs to bring log to PCP for adjustments       Medical Consultants:   renal   Discharge Exam:   Vitals:   12/23/17 0701 12/23/17 0930  BP: (!) 144/63 (!) 162/74  Pulse: 69 76  Resp: 16 18  Temp: 98.1 F (36.7 C) 98 F (36.7 C)  SpO2: 98% 97%   Vitals:   12/22/17 1731 12/22/17 2104 12/23/17 0701 12/23/17 0930  BP: (!) 173/80 (!) 161/69 (!) 144/63 (!) 162/74  Pulse: 73 74 69 76  Resp: 16 16 16 18   Temp: 98.1 F (36.7 C) 98 F (36.7 C) 98.1 F (36.7 C) 98 F (36.7 C)  TempSrc: Oral Oral Oral Oral  SpO2: 97% 100% 98% 97%  Weight:  98.9 kg (218 lb 0.6 oz)      General exam: Appears calm and comfortable  The results of significant diagnostics from this hospitalization (including imaging, microbiology, ancillary and laboratory) are listed below for reference.     Procedures and Diagnostic Studies:   Dg Chest 2 View  Result Date: 12/21/2017 CLINICAL DATA:  57 year old male with a history of palpitations and left arm tingling EXAM: CHEST - 2 VIEW COMPARISON:  05/15/2016, 01/23/2016 FINDINGS: Cardiomediastinal silhouette is unchanged. Prominent soft tissue density superior to the right mainstem airway. Interval placement of right IJ tunneled hemodialysis catheter. No confluent airspace disease.  No pleural effusion or pneumothorax. No acute displaced fracture. IMPRESSION: Negative for acute cardiopulmonary disease. Interval placement of right IJ tunneled hemodialysis catheter. Soft tissue density superior to the right mainstem bronchus, again may represent lymph nodes  or enlarged azygos vein. Electronically Signed   By: Corrie Mckusick D.O.   On: 12/21/2017 13:27     Labs:   Basic Metabolic Panel: Recent Labs  Lab 12/21/17 1302 12/22/17 0852  NA 132* 132*  K 3.8 3.9  CL 90* 89*  CO2 31 30  GLUCOSE 164* 79  BUN 13 21*  CREATININE 6.89* 8.20*  CALCIUM 8.9 8.4*   GFR Estimated Creatinine Clearance: 12.1 mL/min (A)  (by C-G formula based on SCr of 8.2 mg/dL (H)). Liver Function Tests: No results for input(s): AST, ALT, ALKPHOS, BILITOT, PROT, ALBUMIN in the last 168 hours. No results for input(s): LIPASE, AMYLASE in the last 168 hours. No results for input(s): AMMONIA in the last 168 hours. Coagulation profile No results for input(s): INR, PROTIME in the last 168 hours.  CBC: Recent Labs  Lab 12/21/17 1302 12/22/17 0852  WBC 5.0 5.2  HGB 12.3* 12.0*  HCT 38.9* 38.0*  MCV 82.6 82.1  PLT 298 293   Cardiac Enzymes: Recent Labs  Lab 12/22/17 1018 12/22/17 1557 12/22/17 2218  TROPONINI <0.03 <0.03 <0.03   BNP: Invalid input(s): POCBNP CBG: Recent Labs  Lab 12/22/17 1122 12/22/17 1729 12/22/17 2101 12/23/17 0714 12/23/17 0752  GLUCAP 153* 148* 130* 48* 70   D-Dimer No results for input(s): DDIMER in the last 72 hours. Hgb A1c Recent Labs    12/21/17 1714  HGBA1C 7.5*   Lipid Profile No results for input(s): CHOL, HDL, LDLCALC, TRIG, CHOLHDL, LDLDIRECT in the last 72 hours. Thyroid function studies No results for input(s): TSH, T4TOTAL, T3FREE, THYROIDAB in the last 72 hours.  Invalid input(s): FREET3 Anemia work up No results for input(s): VITAMINB12, FOLATE, FERRITIN, TIBC, IRON, RETICCTPCT in the last 72 hours. Microbiology Recent Results (from the past 240 hour(s))  MRSA PCR Screening     Status: None   Collection Time: 12/23/17  2:19 AM  Result Value Ref Range Status   MRSA by PCR NEGATIVE NEGATIVE Final    Comment:        The GeneXpert MRSA Assay (FDA approved for NASAL specimens only), is one component of a comprehensive MRSA colonization surveillance program. It is not intended to diagnose MRSA infection nor to guide or monitor treatment for MRSA infections. Performed at [facility], [place] [address], [place], [place] [address]      Discharge Instructions:   Discharge Instructions    Ambulatory referral to Cardiology   Complete by:  As  directed    Discharge instructions   Complete by:  As directed    Outpatient referral to cardiology for stress test and event monitor Go to HD today Renal/carb mod diet   Increase activity slowly   Complete by:  As directed      Allergies as of 12/23/2017   No Known Allergies     Medication List    TAKE these medications   amLODipine 10 MG tablet Commonly known as:  NORVASC Take 10 mg by mouth at bedtime.   aspirin EC 325 MG tablet Take 325 mg by mouth at bedtime.   atorvastatin 20 MG tablet Commonly known as:  LIPITOR Take 20 mg by mouth at bedtime.   b complex vitamins tablet Take 1 tablet by mouth at bedtime.   brimonidine 0.2 % ophthalmic solution Commonly known as:  ALPHAGAN Place 1 drop into the left eye 2 (two) times daily.   cinacalcet 30 MG tablet Commonly known as:  SENSIPAR Take 30 mg by mouth daily with  supper.   diphenhydramine-acetaminophen 25-500 MG Tabs tablet Commonly known as:  TYLENOL PM Take 1 tablet by mouth at bedtime as needed (pain).   dorzolamide-timolol 22.3-6.8 MG/ML ophthalmic solution Commonly known as:  COSOPT Place 1 drop into the left eye 2 (two) times daily.   erythromycin ophthalmic ointment Place 1 application into the left eye at bedtime as needed (dry eyes/itching).   insulin detemir 100 UNIT/ML injection Commonly known as:  LEVEMIR Inject 18 Units into the skin 2 (two) times daily with a meal.   lanthanum 1000 MG chewable tablet Commonly known as:  FOSRENOL Chew 1,000 mg by mouth 3 (three) times daily with meals.   latanoprost 0.005 % ophthalmic solution Commonly known as:  XALATAN Place 1 drop into the left eye at bedtime.   REFRESH CONTACTS DROPS Soln Place 1 drop into the left eye 2 (two) times daily as needed (dry eyes).   Vitamin D (Ergocalciferol) 50000 units Caps capsule Commonly known as:  DRISDOL Take 50,000 Units by mouth every Thursday.      Follow-up Information    Vincente Liberty, MD Follow  up in 1 week(s).   Specialty:  Pulmonary Disease Contact information: [place] [address] [phone]            Time coordinating discharge: 35 min  Signed:  Geradine Girt  [facility] 12/23/2017, 11:02 AM

## 2017-12-23 NOTE — Discharge Instructions (Signed)
Bring log of blood sugars to PCP for adjustments

## 2017-12-23 NOTE — Progress Notes (Signed)
Pt after visit summary reviewed and pt capable of re-verbalizing medications and follow up appointments. Patient has been instructed to go to his outpatient center for treatment today. Pt remains stable. No signs and symptoms of distress. Educated to return to ER in the event of SOB, dizziness, chest pain, or fainting. Mady Gemma, RN

## 2017-12-23 NOTE — Clinical Social Work Note (Signed)
Patient medically stable for discharge and will be transported by taxi to [facility] to have dialysis today. CSW signing off as no other SW intervention services needed.  Vadhir Mcnay Givens, MSW, LCSW Licensed Clinical Social Worker [place] [phone]

## 2017-12-23 NOTE — Progress Notes (Addendum)
Inpatient Diabetes Program Recommendations  AACE/ADA: New Consensus Statement on Inpatient Glycemic Control (2015)  Target Ranges:  Prepandial:   less than 140 mg/dL      Peak postprandial:   less than 180 mg/dL (1-2 hours)      Critically ill patients:  140 - 180 mg/dL   Results for CASSADY, TURANO (MRN [id number]) as of 12/23/2017 08:51  Ref. Range 12/22/2017 07:45 12/22/2017 08:34 12/22/2017 11:22 12/22/2017 17:29 12/22/2017 21:01  Glucose-Capillary Latest Ref Range: 65 - 99 mg/dL 55 (L) 76 153 (H) 148 (H) 130 (H)   Results for TAMAJ, JURGENS (MRN [id number]) as of 12/23/2017 08:51  Ref. Range 12/23/2017 07:14 12/23/2017 07:52  Glucose-Capillary Latest Ref Range: 65 - 99 mg/dL 48 (L) 70   Admit: CP/ Palplitations  History: DM, ESRD  Home DM Meds: Levemir 18 units BID   Current Orders: Levemir 18 units BID       Novolog Sensitive Correction Scale/ SSI (0-9 units) TID AC      Levemir dose HELD yesterday AM per MD order for Hypoglycemia.  Patient did receive 18 units Levemir last PM.  Hypoglycemic for the last two mornings.  NO Novolog insulin given yesterday (patient refused 12pm and 5 pm doses)     MD- Please consider reducing Levemir to 12 units daily (1/3 total home dose)     --Will follow patient during hospitalization--  Wyn Quaker RN, MSN, CDE Diabetes Coordinator Inpatient Glycemic Control Team Team Pager: [phone] (8a-5p)

## 2017-12-23 NOTE — Progress Notes (Signed)
CBG: 48  Treatment: 15 GM carbohydrate snack  Symptoms: None  Follow-up CBG: Time:0753 CBG Result:70  Possible Reasons for Event: Unknown   Dorthey Sawyer

## 2017-12-25 DIAGNOSIS — D509 Iron deficiency anemia, unspecified: Secondary | ICD-10-CM | POA: Diagnosis not present

## 2017-12-25 DIAGNOSIS — N186 End stage renal disease: Secondary | ICD-10-CM | POA: Diagnosis not present

## 2017-12-25 DIAGNOSIS — E119 Type 2 diabetes mellitus without complications: Secondary | ICD-10-CM | POA: Diagnosis not present

## 2017-12-25 DIAGNOSIS — D631 Anemia in chronic kidney disease: Secondary | ICD-10-CM | POA: Diagnosis not present

## 2017-12-25 DIAGNOSIS — N2581 Secondary hyperparathyroidism of renal origin: Secondary | ICD-10-CM | POA: Diagnosis not present

## 2017-12-27 DIAGNOSIS — D631 Anemia in chronic kidney disease: Secondary | ICD-10-CM | POA: Diagnosis not present

## 2017-12-27 DIAGNOSIS — N186 End stage renal disease: Secondary | ICD-10-CM | POA: Diagnosis not present

## 2017-12-27 DIAGNOSIS — N2581 Secondary hyperparathyroidism of renal origin: Secondary | ICD-10-CM | POA: Diagnosis not present

## 2017-12-27 DIAGNOSIS — E119 Type 2 diabetes mellitus without complications: Secondary | ICD-10-CM | POA: Diagnosis not present

## 2017-12-27 DIAGNOSIS — D509 Iron deficiency anemia, unspecified: Secondary | ICD-10-CM | POA: Diagnosis not present

## 2017-12-30 DIAGNOSIS — E119 Type 2 diabetes mellitus without complications: Secondary | ICD-10-CM | POA: Diagnosis not present

## 2017-12-30 DIAGNOSIS — N2581 Secondary hyperparathyroidism of renal origin: Secondary | ICD-10-CM | POA: Diagnosis not present

## 2017-12-30 DIAGNOSIS — D509 Iron deficiency anemia, unspecified: Secondary | ICD-10-CM | POA: Diagnosis not present

## 2017-12-30 DIAGNOSIS — N186 End stage renal disease: Secondary | ICD-10-CM | POA: Diagnosis not present

## 2017-12-30 DIAGNOSIS — D631 Anemia in chronic kidney disease: Secondary | ICD-10-CM | POA: Diagnosis not present

## 2017-12-31 ENCOUNTER — Other Ambulatory Visit (HOSPITAL_COMMUNITY): Payer: Self-pay | Admitting: Pulmonary Disease

## 2017-12-31 DIAGNOSIS — I129 Hypertensive chronic kidney disease with stage 1 through stage 4 chronic kidney disease, or unspecified chronic kidney disease: Secondary | ICD-10-CM | POA: Diagnosis not present

## 2017-12-31 DIAGNOSIS — E1165 Type 2 diabetes mellitus with hyperglycemia: Secondary | ICD-10-CM | POA: Diagnosis not present

## 2017-12-31 DIAGNOSIS — Z125 Encounter for screening for malignant neoplasm of prostate: Secondary | ICD-10-CM | POA: Diagnosis not present

## 2017-12-31 DIAGNOSIS — E1140 Type 2 diabetes mellitus with diabetic neuropathy, unspecified: Secondary | ICD-10-CM | POA: Diagnosis not present

## 2017-12-31 DIAGNOSIS — E11319 Type 2 diabetes mellitus with unspecified diabetic retinopathy without macular edema: Secondary | ICD-10-CM | POA: Diagnosis not present

## 2017-12-31 DIAGNOSIS — E7800 Pure hypercholesterolemia, unspecified: Secondary | ICD-10-CM | POA: Diagnosis not present

## 2017-12-31 DIAGNOSIS — Z79899 Other long term (current) drug therapy: Secondary | ICD-10-CM | POA: Diagnosis not present

## 2017-12-31 DIAGNOSIS — I119 Hypertensive heart disease without heart failure: Secondary | ICD-10-CM | POA: Diagnosis not present

## 2017-12-31 DIAGNOSIS — E113592 Type 2 diabetes mellitus with proliferative diabetic retinopathy without macular edema, left eye: Secondary | ICD-10-CM | POA: Diagnosis not present

## 2017-12-31 DIAGNOSIS — I10 Essential (primary) hypertension: Secondary | ICD-10-CM

## 2017-12-31 DIAGNOSIS — E559 Vitamin D deficiency, unspecified: Secondary | ICD-10-CM | POA: Diagnosis not present

## 2017-12-31 DIAGNOSIS — I2510 Atherosclerotic heart disease of native coronary artery without angina pectoris: Secondary | ICD-10-CM | POA: Diagnosis not present

## 2017-12-31 DIAGNOSIS — I6990 Unspecified sequelae of unspecified cerebrovascular disease: Secondary | ICD-10-CM | POA: Diagnosis not present

## 2017-12-31 DIAGNOSIS — H548 Legal blindness, as defined in USA: Secondary | ICD-10-CM | POA: Diagnosis not present

## 2018-01-01 DIAGNOSIS — D631 Anemia in chronic kidney disease: Secondary | ICD-10-CM | POA: Diagnosis not present

## 2018-01-01 DIAGNOSIS — E119 Type 2 diabetes mellitus without complications: Secondary | ICD-10-CM | POA: Diagnosis not present

## 2018-01-01 DIAGNOSIS — N2581 Secondary hyperparathyroidism of renal origin: Secondary | ICD-10-CM | POA: Diagnosis not present

## 2018-01-01 DIAGNOSIS — N186 End stage renal disease: Secondary | ICD-10-CM | POA: Diagnosis not present

## 2018-01-01 DIAGNOSIS — D509 Iron deficiency anemia, unspecified: Secondary | ICD-10-CM | POA: Diagnosis not present

## 2018-01-02 ENCOUNTER — Ambulatory Visit (HOSPITAL_COMMUNITY)
Admission: RE | Admit: 2018-01-02 | Discharge: 2018-01-02 | Disposition: A | Discharge status: Home / Self Care | Payer: Medicare Other | Source: Ambulatory Visit | Attending: Pulmonary Disease | Admitting: Pulmonary Disease

## 2018-01-02 DIAGNOSIS — R93422 Abnormal radiologic findings on diagnostic imaging of left kidney: Secondary | ICD-10-CM | POA: Diagnosis not present

## 2018-01-02 DIAGNOSIS — I10 Essential (primary) hypertension: Secondary | ICD-10-CM | POA: Diagnosis not present

## 2018-01-02 DIAGNOSIS — R93421 Abnormal radiologic findings on diagnostic imaging of right kidney: Secondary | ICD-10-CM | POA: Diagnosis not present

## 2018-01-02 NOTE — Progress Notes (Signed)
Renal artery duplex prelim: technically limited due to body habitus and overlying bowel gas. Right renal artery was not visualized well enough for evaluation. No obvious evidence of left RAS. Landry Mellow, RDMS, RVT

## 2018-01-03 DIAGNOSIS — N2581 Secondary hyperparathyroidism of renal origin: Secondary | ICD-10-CM | POA: Diagnosis not present

## 2018-01-03 DIAGNOSIS — N186 End stage renal disease: Secondary | ICD-10-CM | POA: Diagnosis not present

## 2018-01-03 DIAGNOSIS — E119 Type 2 diabetes mellitus without complications: Secondary | ICD-10-CM | POA: Diagnosis not present

## 2018-01-03 DIAGNOSIS — D509 Iron deficiency anemia, unspecified: Secondary | ICD-10-CM | POA: Diagnosis not present

## 2018-01-03 DIAGNOSIS — D631 Anemia in chronic kidney disease: Secondary | ICD-10-CM | POA: Diagnosis not present

## 2018-01-06 DIAGNOSIS — D509 Iron deficiency anemia, unspecified: Secondary | ICD-10-CM | POA: Diagnosis not present

## 2018-01-06 DIAGNOSIS — N186 End stage renal disease: Secondary | ICD-10-CM | POA: Diagnosis not present

## 2018-01-06 DIAGNOSIS — I120 Hypertensive chronic kidney disease with stage 5 chronic kidney disease or end stage renal disease: Secondary | ICD-10-CM | POA: Diagnosis not present

## 2018-01-06 DIAGNOSIS — Z992 Dependence on renal dialysis: Secondary | ICD-10-CM | POA: Diagnosis not present

## 2018-01-06 DIAGNOSIS — N2581 Secondary hyperparathyroidism of renal origin: Secondary | ICD-10-CM | POA: Diagnosis not present

## 2018-01-06 DIAGNOSIS — D631 Anemia in chronic kidney disease: Secondary | ICD-10-CM | POA: Diagnosis not present

## 2018-01-06 DIAGNOSIS — E119 Type 2 diabetes mellitus without complications: Secondary | ICD-10-CM | POA: Diagnosis not present

## 2018-01-08 DIAGNOSIS — D631 Anemia in chronic kidney disease: Secondary | ICD-10-CM | POA: Diagnosis not present

## 2018-01-08 DIAGNOSIS — E119 Type 2 diabetes mellitus without complications: Secondary | ICD-10-CM | POA: Diagnosis not present

## 2018-01-08 DIAGNOSIS — N186 End stage renal disease: Secondary | ICD-10-CM | POA: Diagnosis not present

## 2018-01-08 DIAGNOSIS — D509 Iron deficiency anemia, unspecified: Secondary | ICD-10-CM | POA: Diagnosis not present

## 2018-01-08 DIAGNOSIS — N2581 Secondary hyperparathyroidism of renal origin: Secondary | ICD-10-CM | POA: Diagnosis not present

## 2018-01-10 DIAGNOSIS — D631 Anemia in chronic kidney disease: Secondary | ICD-10-CM | POA: Diagnosis not present

## 2018-01-10 DIAGNOSIS — N2581 Secondary hyperparathyroidism of renal origin: Secondary | ICD-10-CM | POA: Diagnosis not present

## 2018-01-10 DIAGNOSIS — N186 End stage renal disease: Secondary | ICD-10-CM | POA: Diagnosis not present

## 2018-01-10 DIAGNOSIS — E119 Type 2 diabetes mellitus without complications: Secondary | ICD-10-CM | POA: Diagnosis not present

## 2018-01-10 DIAGNOSIS — D509 Iron deficiency anemia, unspecified: Secondary | ICD-10-CM | POA: Diagnosis not present

## 2018-01-13 DIAGNOSIS — D509 Iron deficiency anemia, unspecified: Secondary | ICD-10-CM | POA: Diagnosis not present

## 2018-01-13 DIAGNOSIS — D631 Anemia in chronic kidney disease: Secondary | ICD-10-CM | POA: Diagnosis not present

## 2018-01-13 DIAGNOSIS — E119 Type 2 diabetes mellitus without complications: Secondary | ICD-10-CM | POA: Diagnosis not present

## 2018-01-13 DIAGNOSIS — N186 End stage renal disease: Secondary | ICD-10-CM | POA: Diagnosis not present

## 2018-01-13 DIAGNOSIS — N2581 Secondary hyperparathyroidism of renal origin: Secondary | ICD-10-CM | POA: Diagnosis not present

## 2018-01-15 DIAGNOSIS — N186 End stage renal disease: Secondary | ICD-10-CM | POA: Diagnosis not present

## 2018-01-15 DIAGNOSIS — E119 Type 2 diabetes mellitus without complications: Secondary | ICD-10-CM | POA: Diagnosis not present

## 2018-01-15 DIAGNOSIS — D631 Anemia in chronic kidney disease: Secondary | ICD-10-CM | POA: Diagnosis not present

## 2018-01-15 DIAGNOSIS — N2581 Secondary hyperparathyroidism of renal origin: Secondary | ICD-10-CM | POA: Diagnosis not present

## 2018-01-15 DIAGNOSIS — D509 Iron deficiency anemia, unspecified: Secondary | ICD-10-CM | POA: Diagnosis not present

## 2018-01-15 NOTE — Progress Notes (Signed)
Cardiology Office Note:    Date:  01/16/2018   ID:  Shawn [name], DOB 1961-05-27, MRN [id number]  PCP:  Vincente Liberty, MD  Cardiologist:  No primary care provider on file.   Referring MD: Geradine Girt, DO   CC: palpitations, chest tightness, left arm tingling  History of Present Illness:    Shawn [name] is a 57 y.o. male with a hx of end stage renal disease on dialysis, CVA, hypertension, diabetes, legal blindness who is seen as a new consult at the request of Dr. Eliseo Squires for palpitations and possible atrial fibrillation as well as recent left arm tingling and pain. He was recently discharged on 12/23/17 after presenting with left arm tingling and palpitations. Acute MI was ruled out, and it was attributed to a grief reaction given that his sister and a close friend had just passed away. Per the discharge summery, no atrial fibrillation was seen on telemetry.  Patient concerns: heart feels like it's "flipping," feels like it beats fast and then stops. Happens 1-2x/day. Worse at dialysis. Lasts last 30 minutes or so of his HD treatment. Had in the distant past but not for a long time, restarted after his sister passed away. Only aggravating factor is stress, better with laying down. Feels lightheaded all the time, but no clear associated symptoms such as shortness of breath, nausea, or diaphoresis. Has episodes of vertigo if he lays on the bed and moves his head a certain way, but no syncope. Right before the events has left sided chest tightness very briefly.  Prior to admission, noted pain that started in his shoulder, moved down his elbow to hs hand. Not a sharp pain or not dull, like electricity. Didn't last long. No associated SOB, nausea, diaphoresis. Was associated with chest tightness and palpitations as well as a headache.  FH: brother has a defibrillator, father had a heart problem, sister who just passed had a heart problem but thought to have passed from a blood  clot. All 9 siblings and his parents had/have diabetes. He is the only one on dialysis.  Past Medical History:  Diagnosis Date  . Anemia   . Arthritis    patient denies  . Blind right eye   . Chronic kidney disease    Dialysis since 2011 MWF  . Complication of anesthesia   . Diabetes mellitus    type II  . Hypertension   . Legally blind   . No pertinent past medical history   . PONV (postoperative nausea and vomiting)    N/V- became dehydrated had to have IV fluids- 01/2015  . Shortness of breath dyspnea    when has too fluid  . Stroke [facility]) 2012   date per patient  . Syncope   . 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 AV FISTULA PLACEMENT Right 05/12/2013   Procedure: INSERTION OF ARTERIOVENOUS (AV) GORE-TEX GRAFT ARM; ULTRASOUND GUIDED;  Surgeon: Conrad Kannapolis, MD;  Location: [place];  Service: Vascular;  Laterality: Right;  . AV FISTULA PLACEMENT Left 01/18/2015   Procedure: INSERTION OF ARTERIOVENOUS GORE-TEX GRAFT LEFT UPPER ARM;  Surgeon: Conrad Everton, MD;  Location: [place];  Service: Vascular;  Laterality: Left;  . COLONOSCOPY  06/2012  . EYE SURGERY Bilateral    retina surgery both eyes, cataract surgery both eyes  . HERNIA REPAIR     right inguinal  . INSERTION OF DIALYSIS CATHETER Right   .  left arm graft  10/2010  . LIGATION ARTERIOVENOUS GORTEX GRAFT Left 05/03/2015   Procedure: LIGATION ARTERIOVENOUS GORTEX GRAFT-LEFT UPPER ARM;  Surgeon: Conrad Harlan, MD;  Location: [facility] OR;  Service: Vascular;  Laterality: Left;    Current Medications: Current Outpatient Medications on File Prior to Visit  Medication Sig  . amLODipine (NORVASC) 10 MG tablet Take 10 mg by mouth at bedtime.  Marland Kitchen aspirin EC 325 MG tablet Take 325 mg by mouth at bedtime.  Marland Kitchen atorvastatin (LIPITOR) 20 MG tablet Take 20 mg by mouth at bedtime.   Marland Kitchen b complex vitamins tablet Take 1 tablet by mouth at bedtime.  .  brimonidine (ALPHAGAN) 0.2 % ophthalmic solution Place 1 drop into the left eye 2 (two) times daily.  . cinacalcet (SENSIPAR) 30 MG tablet Take 30 mg by mouth daily with supper.  . diphenhydramine-acetaminophen (TYLENOL PM) 25-500 MG TABS tablet Take 1 tablet by mouth at bedtime as needed (pain).  . dorzolamide-timolol (COSOPT) 22.3-6.8 MG/ML ophthalmic solution Place 1 drop into the left eye 2 (two) times daily.  Marland Kitchen erythromycin ophthalmic ointment Place 1 application into the left eye at bedtime as needed (dry eyes/itching).   . insulin detemir (LEVEMIR) 100 UNIT/ML injection Inject 18 Units into the skin 2 (two) times daily with a meal.  . lanthanum (FOSRENOL) 1000 MG chewable tablet Chew 1,000 mg by mouth 3 (three) times daily with meals.  . latanoprost (XALATAN) 0.005 % ophthalmic solution Place 1 drop into the left eye at bedtime.  . metoprolol tartrate (LOPRESSOR) 25 MG tablet Take 25 mg by mouth 2 (two) times daily.  . Soft Lens Products (REFRESH CONTACTS DROPS) SOLN Place 1 drop into the left eye 2 (two) times daily as needed (dry eyes).  . Vitamin D, Ergocalciferol, (DRISDOL) 50000 units CAPS capsule Take 50,000 Units by mouth every Thursday.   No current facility-administered medications on file prior to visit.      Allergies:   Patient has no known allergies.   Social History   Socioeconomic History  . Marital status: Married    Spouse name: Not on file  . Number of children: 0  . Years of education: 12th  . Highest education level: Not on file  Occupational History    Employer: UNEMPLOYED  Social Needs  . Financial resource strain: Not on file  . Food insecurity:    Worry: Not on file    Inability: Not on file  . Transportation needs:    Medical: Not on file    Non-medical: Not on file  Tobacco Use  . Smoking status: Never Smoker  . Smokeless tobacco: Never Used  Substance and Sexual Activity  . Alcohol use: No    Alcohol/week: 0.0 oz  . Drug use: No  . Sexual  activity: Not on file  Lifestyle  . Physical activity:    Days per week: Not on file    Minutes per session: Not on file  . Stress: Not on file  Relationships  . Social connections:    Talks on phone: Not on file    Gets together: Not on file    Attends religious service: Not on file    Active member of club or organization: Not on file    Attends meetings of clubs or organizations: Not on file    Relationship status: Not on file  Other Topics Concern  . Not on file  Social History Narrative   Patient lives at home with wife.    Patient  is disabled.    Patient has no children.    Patient has a 12 grade education.         Family History: The patient's family history includes Alzheimer's disease in his mother; Cancer in his father and sister; Diabetes in his mother; Heart disease in his father; Stroke in his sister. brother has a defibrillator, father had a heart problem, sister who just passed had a heart problem but thought to have passed from a blood clot. All 9 siblings and his parents had/have diabetes. He is the only one on dialysis.  ROS:   Please see the history of present illness.  Additional pertinent ROS: Review of Systems  Constitutional: Negative for chills, diaphoresis, fever and malaise/fatigue.  HENT: Negative for hearing loss.   Eyes: Negative for pain.       Legally blind  Respiratory: Negative for cough, shortness of breath and wheezing.   Cardiovascular: Positive for chest pain and palpitations. Negative for orthopnea, claudication, leg swelling and PND.  Gastrointestinal: Negative for abdominal pain, nausea and vomiting.  Genitourinary: Negative for flank pain.  Musculoskeletal: Negative for falls and joint pain.  Skin: Negative for itching.  Neurological: Positive for dizziness, tingling and headaches. Negative for sensory change, focal weakness and loss of consciousness.  Endo/Heme/Allergies: Does not bruise/bleed easily.    EKGs/Labs/Other Studies  Reviewed:    The following studies were reviewed today: Echo from 12/22/17 personally reviewed, agree with report: Left ventricle: The cavity size was normal. There was mild   concentric hypertrophy. Systolic function was mildly reduced. The   estimated ejection fraction was in the range of 45% to 50%. Mild   diffuse hypokinesis. Doppler parameters are consistent with   abnormal left ventricular relaxation (grade 1 diastolic   dysfunction). Doppler parameters are consistent with high   ventricular filling pressure. - Aortic valve: Transvalvular velocity was within the normal range.   There was no stenosis. There was no regurgitation. Valve area   (VTI): 2.17 cm^2. Valve area (Vmax): 2.16 cm^2. Valve area   (Vmean): 2.14 cm^2. - Mitral valve: Transvalvular velocity was within the normal range.   There was no evidence for stenosis. There was trivial   regurgitation. - Left atrium: The atrium was mildly dilated. - Right ventricle: The cavity size was normal. Wall thickness was   normal. Systolic function was normal. - Tricuspid valve: There was trivial regurgitation. - Pulmonary arteries: Systolic pressure was within the normal   range. PA peak pressure: 35 mm Hg (S).  ECG from 12/22/17 showed normal sinus rhythm with prolonged QT, personally reviewed.  EKG:  EKG is ordered today.  The ekg ordered today demonstrates sinus bradycadia with long QT  Recent Labs: 12/22/2017: BUN 21; Creatinine, Ser 8.20; Hemoglobin 12.0; Platelets 293; Potassium 3.9; Sodium 132  Recent Lipid Panel    Component Value Date/Time   CHOL (H) 01/26/2010 0520    207        ATP III CLASSIFICATION:  <200     mg/dL   Desirable  200-239  mg/dL   Borderline High  >=240    mg/dL   High          TRIG 115 01/26/2010 0520   HDL 39 (L) 01/26/2010 0520   CHOLHDL 5.3 01/26/2010 0520   VLDL 23 01/26/2010 0520   LDLCALC (H) 01/26/2010 0520    145        Total Cholesterol/HDL:CHD Risk Coronary Heart Disease Risk  Table  Men   Women  1/2 Average Risk   3.4   3.3  Average Risk       5.0   4.4  2 X Average Risk   9.6   7.1  3 X Average Risk  23.4   11.0        Use the calculated Patient Ratio above and the CHD Risk Table to determine the patient's CHD Risk.        ATP III CLASSIFICATION (LDL):  <100     mg/dL   Optimal  100-129  mg/dL   Near or Above                    Optimal  130-159  mg/dL   Borderline  160-189  mg/dL   High  >190     mg/dL   Very High    Physical Exam:    VS:  BP (!) 148/70   Pulse (!) 58   Ht 5\' 10"  (1.778 m)   Wt 214 lb (97.1 kg)   BMI 30.71 kg/m     Wt Readings from Last 3 Encounters:  01/16/18 214 lb (97.1 kg)  12/22/17 218 lb 0.6 oz (98.9 kg)  11/12/17 217 lb 2.5 oz (98.5 kg)     GEN: Well nourished, well developed in no acute distress HEENT: Normal NECK: No JVD; No carotid bruits LYMPHATICS: No lymphadenopathy CARDIAC: regular rhythm, normal S1 and S2, no murmurs, rubs, gallops RESPIRATORY:  Clear to auscultation without rales, wheezing or rhonchi  ABDOMEN: Soft, non-tender, non-distended MUSCULOSKELETAL:  No edema; No deformity  SKIN: Warm and dry NEUROLOGIC:  Alert and oriented x 3 PSYCHIATRIC:  Normal affect   ASSESSMENT:    1. Palpitations   2. ESRD on dialysis ([place])   3. Diabetes mellitus with complication ([place])   4. Renovascular hypertension   5. Tingling of left upper extremity   6. Chest pain in adult   7. Pure hypercholesterolemia   8. Counseling on health promotion and disease prevention    PLAN:    In order of problems listed above:  1. Palpitations: Frequent, associated with stress and dialysis. If atrial fibrillation, would need to discuss anticoagulation -will order 14 day monitor as nothing detected on >48 hours of telemetry while in hospital. Will have placed after stress test.  2. Chest tightness:  -multiple risk factors, concerning symptoms -legally blind so cannot treadmill. Will order lexiscan  stress test for evaluation  3. Cardiovascular risk factors and prevention -risk factors: hyperlipidemia, vascular disease (CVA and ESRD on dialysis), diabetes with complications, hypertension -defer to neurology re: aspirin dosing, patient was told he should remain on aspirin 325 mg. Will need to re-evaluate if atrial fibrillation detected. -based on results of stress test, may need to be more aggressive. Ldl 145 on atorvastatin 20 mg. Will recheck at next visit in 3 mos, if still elevated will discuss intensifying statin. -discussed lifestyle recommendations. Exercise ability limited given legal blindness. On renal diet.  Will follow up in 3 mos to monitor symptoms and optimize regimen  Medication Adjustments/Labs and Tests Ordered: Current medicines are reviewed at length with the patient today.  Concerns regarding medicines are outlined above.  Orders Placed This Encounter  Procedures  . MYOCARDIAL PERFUSION IMAGING  . CARDIAC EVENT MONITOR  . EKG 12-Lead   No orders of the defined types were placed in this encounter.   Patient Instructions  Medication Instructions: Your physician recommends that you continue on your current medications as directed.  If you need a refill on your cardiac medications before your next appointment, please call your pharmacy.   Labwork: None  Procedures/Testing: Your physician has requested that you have a lexiscan myoview. For further information please visit [email or web address]. Please follow instruction sheet, as given. [place]  Your physician has recommended that you wear a 14 day event monitor after Lexiscan. Event monitors are medical devices that record the heart's electrical activity. Doctors most often [place] these monitors to diagnose arrhythmias. Arrhythmias are problems with the speed or rhythm of the heartbeat. The monitor is a small, portable device. You can wear one while you do your normal daily activities. This is  usually used to diagnose what is causing palpitations/syncope (passing out). [address]      Follow-Up: Your physician wants you to follow-up in 3 months with Dr. Harrell Gave   Special Instructions:    Thank you for choosing Heartcare at [facility]!!       Signed, Buford Dresser, MD PhD 01/16/2018 8:45 AM    [place]

## 2018-01-16 ENCOUNTER — Encounter: Payer: Self-pay | Admitting: Cardiology

## 2018-01-16 ENCOUNTER — Ambulatory Visit (INDEPENDENT_AMBULATORY_CARE_PROVIDER_SITE_OTHER): Payer: Medicare Other | Admitting: Cardiology

## 2018-01-16 VITALS — BP 148/70 | HR 58 | Ht 70.0 in | Wt 214.0 lb

## 2018-01-16 DIAGNOSIS — E118 Type 2 diabetes mellitus with unspecified complications: Secondary | ICD-10-CM | POA: Diagnosis not present

## 2018-01-16 DIAGNOSIS — R079 Chest pain, unspecified: Secondary | ICD-10-CM

## 2018-01-16 DIAGNOSIS — R002 Palpitations: Secondary | ICD-10-CM

## 2018-01-16 DIAGNOSIS — I150 Renovascular hypertension: Secondary | ICD-10-CM | POA: Diagnosis not present

## 2018-01-16 DIAGNOSIS — Z992 Dependence on renal dialysis: Secondary | ICD-10-CM | POA: Diagnosis not present

## 2018-01-16 DIAGNOSIS — N186 End stage renal disease: Secondary | ICD-10-CM

## 2018-01-16 DIAGNOSIS — R202 Paresthesia of skin: Secondary | ICD-10-CM

## 2018-01-16 DIAGNOSIS — E7800 Pure hypercholesterolemia, unspecified: Secondary | ICD-10-CM | POA: Diagnosis not present

## 2018-01-16 DIAGNOSIS — Z7189 Other specified counseling: Secondary | ICD-10-CM

## 2018-01-16 NOTE — Patient Instructions (Signed)
Medication Instructions: Your physician recommends that you continue on your current medications as directed.    If you need a refill on your cardiac medications before your next appointment, please call your pharmacy.   Labwork: None  Procedures/Testing: Your physician has requested that you have a lexiscan myoview. For further information please visit [email or web address]. Please follow instruction sheet, as given. [place]. [address]  Your physician has recommended that you wear a 14 day event monitor after Lexiscan. Event monitors are medical devices that record the heart's electrical activity. Doctors most often [place] these monitors to diagnose arrhythmias. Arrhythmias are problems with the speed or rhythm of the heartbeat. The monitor is a small, portable device. You can wear one while you do your normal daily activities. This is usually used to diagnose what is causing palpitations/syncope (passing out). [place] [address]      Follow-Up: Your physician wants you to follow-up in 3 months with Dr. Harrell Gave   Special Instructions:    Thank you for choosing Heartcare at [facility]!!

## 2018-01-17 ENCOUNTER — Telehealth (HOSPITAL_COMMUNITY): Payer: Self-pay

## 2018-01-17 DIAGNOSIS — E119 Type 2 diabetes mellitus without complications: Secondary | ICD-10-CM | POA: Diagnosis not present

## 2018-01-17 DIAGNOSIS — D509 Iron deficiency anemia, unspecified: Secondary | ICD-10-CM | POA: Diagnosis not present

## 2018-01-17 DIAGNOSIS — N2581 Secondary hyperparathyroidism of renal origin: Secondary | ICD-10-CM | POA: Diagnosis not present

## 2018-01-17 DIAGNOSIS — N186 End stage renal disease: Secondary | ICD-10-CM | POA: Diagnosis not present

## 2018-01-17 DIAGNOSIS — D631 Anemia in chronic kidney disease: Secondary | ICD-10-CM | POA: Diagnosis not present

## 2018-01-17 NOTE — Telephone Encounter (Signed)
Encounter complete. 

## 2018-01-20 DIAGNOSIS — E119 Type 2 diabetes mellitus without complications: Secondary | ICD-10-CM | POA: Diagnosis not present

## 2018-01-20 DIAGNOSIS — D509 Iron deficiency anemia, unspecified: Secondary | ICD-10-CM | POA: Diagnosis not present

## 2018-01-20 DIAGNOSIS — N186 End stage renal disease: Secondary | ICD-10-CM | POA: Diagnosis not present

## 2018-01-20 DIAGNOSIS — N2581 Secondary hyperparathyroidism of renal origin: Secondary | ICD-10-CM | POA: Diagnosis not present

## 2018-01-20 DIAGNOSIS — D631 Anemia in chronic kidney disease: Secondary | ICD-10-CM | POA: Diagnosis not present

## 2018-01-21 DIAGNOSIS — N186 End stage renal disease: Secondary | ICD-10-CM | POA: Diagnosis not present

## 2018-01-21 DIAGNOSIS — E119 Type 2 diabetes mellitus without complications: Secondary | ICD-10-CM | POA: Diagnosis not present

## 2018-01-21 DIAGNOSIS — D509 Iron deficiency anemia, unspecified: Secondary | ICD-10-CM | POA: Diagnosis not present

## 2018-01-21 DIAGNOSIS — D631 Anemia in chronic kidney disease: Secondary | ICD-10-CM | POA: Diagnosis not present

## 2018-01-21 DIAGNOSIS — N2581 Secondary hyperparathyroidism of renal origin: Secondary | ICD-10-CM | POA: Diagnosis not present

## 2018-01-22 ENCOUNTER — Ambulatory Visit (HOSPITAL_COMMUNITY)
Admission: RE | Admit: 2018-01-22 | Discharge: 2018-01-22 | Disposition: A | Discharge status: Home / Self Care | Payer: Medicare Other | Source: Ambulatory Visit | Attending: Cardiovascular Disease | Admitting: Cardiovascular Disease

## 2018-01-22 DIAGNOSIS — R079 Chest pain, unspecified: Secondary | ICD-10-CM | POA: Insufficient documentation

## 2018-01-22 DIAGNOSIS — R002 Palpitations: Secondary | ICD-10-CM | POA: Insufficient documentation

## 2018-01-22 LAB — MYOCARDIAL PERFUSION IMAGING
CHL CUP NUCLEAR SDS: 0
CHL CUP NUCLEAR SRS: 0
LV dias vol: 141 mL (ref 62–150)
LVSYSVOL: 70 mL
NUC STRESS TID: 1.03
Peak HR: 71 {beats}/min
Rest HR: 62 {beats}/min
SSS: 0

## 2018-01-22 MED ORDER — TECHNETIUM TC 99M TETROFOSMIN IV KIT
32.9000 | PACK | Freq: Once | INTRAVENOUS | Status: AC | PRN
  Administered 2018-01-22: 32.9 via INTRAVENOUS
  Filled 2018-01-22: qty 33

## 2018-01-22 MED ORDER — REGADENOSON 0.4 MG/5ML IV SOLN
0.4000 mg | Freq: Once | INTRAVENOUS | Status: AC
  Administered 2018-01-22: 0.4 mg via INTRAVENOUS

## 2018-01-22 MED ORDER — TECHNETIUM TC 99M TETROFOSMIN IV KIT
10.6000 | PACK | Freq: Once | INTRAVENOUS | Status: AC | PRN
  Administered 2018-01-22: 10.6 via INTRAVENOUS
  Filled 2018-01-22: qty 11

## 2018-01-24 DIAGNOSIS — N2581 Secondary hyperparathyroidism of renal origin: Secondary | ICD-10-CM | POA: Diagnosis not present

## 2018-01-24 DIAGNOSIS — D509 Iron deficiency anemia, unspecified: Secondary | ICD-10-CM | POA: Diagnosis not present

## 2018-01-24 DIAGNOSIS — N186 End stage renal disease: Secondary | ICD-10-CM | POA: Diagnosis not present

## 2018-01-24 DIAGNOSIS — E119 Type 2 diabetes mellitus without complications: Secondary | ICD-10-CM | POA: Diagnosis not present

## 2018-01-24 DIAGNOSIS — D631 Anemia in chronic kidney disease: Secondary | ICD-10-CM | POA: Diagnosis not present

## 2018-01-27 DIAGNOSIS — N2581 Secondary hyperparathyroidism of renal origin: Secondary | ICD-10-CM | POA: Diagnosis not present

## 2018-01-27 DIAGNOSIS — E119 Type 2 diabetes mellitus without complications: Secondary | ICD-10-CM | POA: Diagnosis not present

## 2018-01-27 DIAGNOSIS — D509 Iron deficiency anemia, unspecified: Secondary | ICD-10-CM | POA: Diagnosis not present

## 2018-01-27 DIAGNOSIS — N186 End stage renal disease: Secondary | ICD-10-CM | POA: Diagnosis not present

## 2018-01-27 DIAGNOSIS — D631 Anemia in chronic kidney disease: Secondary | ICD-10-CM | POA: Diagnosis not present

## 2018-01-28 ENCOUNTER — Ambulatory Visit (INDEPENDENT_AMBULATORY_CARE_PROVIDER_SITE_OTHER): Payer: Medicare Other

## 2018-01-28 DIAGNOSIS — R079 Chest pain, unspecified: Secondary | ICD-10-CM

## 2018-01-28 DIAGNOSIS — R002 Palpitations: Secondary | ICD-10-CM | POA: Diagnosis not present

## 2018-01-29 DIAGNOSIS — E119 Type 2 diabetes mellitus without complications: Secondary | ICD-10-CM | POA: Diagnosis not present

## 2018-01-29 DIAGNOSIS — E1129 Type 2 diabetes mellitus with other diabetic kidney complication: Secondary | ICD-10-CM | POA: Diagnosis not present

## 2018-01-29 DIAGNOSIS — D509 Iron deficiency anemia, unspecified: Secondary | ICD-10-CM | POA: Diagnosis not present

## 2018-01-29 DIAGNOSIS — D631 Anemia in chronic kidney disease: Secondary | ICD-10-CM | POA: Diagnosis not present

## 2018-01-29 DIAGNOSIS — N186 End stage renal disease: Secondary | ICD-10-CM | POA: Diagnosis not present

## 2018-01-29 DIAGNOSIS — N2581 Secondary hyperparathyroidism of renal origin: Secondary | ICD-10-CM | POA: Diagnosis not present

## 2018-01-31 DIAGNOSIS — E119 Type 2 diabetes mellitus without complications: Secondary | ICD-10-CM | POA: Diagnosis not present

## 2018-01-31 DIAGNOSIS — D509 Iron deficiency anemia, unspecified: Secondary | ICD-10-CM | POA: Diagnosis not present

## 2018-01-31 DIAGNOSIS — D631 Anemia in chronic kidney disease: Secondary | ICD-10-CM | POA: Diagnosis not present

## 2018-01-31 DIAGNOSIS — N186 End stage renal disease: Secondary | ICD-10-CM | POA: Diagnosis not present

## 2018-01-31 DIAGNOSIS — N2581 Secondary hyperparathyroidism of renal origin: Secondary | ICD-10-CM | POA: Diagnosis not present

## 2018-02-03 DIAGNOSIS — N2581 Secondary hyperparathyroidism of renal origin: Secondary | ICD-10-CM | POA: Diagnosis not present

## 2018-02-03 DIAGNOSIS — D509 Iron deficiency anemia, unspecified: Secondary | ICD-10-CM | POA: Diagnosis not present

## 2018-02-03 DIAGNOSIS — D631 Anemia in chronic kidney disease: Secondary | ICD-10-CM | POA: Diagnosis not present

## 2018-02-03 DIAGNOSIS — E119 Type 2 diabetes mellitus without complications: Secondary | ICD-10-CM | POA: Diagnosis not present

## 2018-02-03 DIAGNOSIS — N186 End stage renal disease: Secondary | ICD-10-CM | POA: Diagnosis not present

## 2018-02-05 DIAGNOSIS — D631 Anemia in chronic kidney disease: Secondary | ICD-10-CM | POA: Diagnosis not present

## 2018-02-05 DIAGNOSIS — D509 Iron deficiency anemia, unspecified: Secondary | ICD-10-CM | POA: Diagnosis not present

## 2018-02-05 DIAGNOSIS — N2581 Secondary hyperparathyroidism of renal origin: Secondary | ICD-10-CM | POA: Diagnosis not present

## 2018-02-05 DIAGNOSIS — E119 Type 2 diabetes mellitus without complications: Secondary | ICD-10-CM | POA: Diagnosis not present

## 2018-02-05 DIAGNOSIS — N186 End stage renal disease: Secondary | ICD-10-CM | POA: Diagnosis not present

## 2018-02-06 DIAGNOSIS — N186 End stage renal disease: Secondary | ICD-10-CM | POA: Diagnosis not present

## 2018-02-06 DIAGNOSIS — I120 Hypertensive chronic kidney disease with stage 5 chronic kidney disease or end stage renal disease: Secondary | ICD-10-CM | POA: Diagnosis not present

## 2018-02-06 DIAGNOSIS — Z992 Dependence on renal dialysis: Secondary | ICD-10-CM | POA: Diagnosis not present

## 2018-02-07 DIAGNOSIS — D509 Iron deficiency anemia, unspecified: Secondary | ICD-10-CM | POA: Diagnosis not present

## 2018-02-07 DIAGNOSIS — E119 Type 2 diabetes mellitus without complications: Secondary | ICD-10-CM | POA: Diagnosis not present

## 2018-02-07 DIAGNOSIS — N186 End stage renal disease: Secondary | ICD-10-CM | POA: Diagnosis not present

## 2018-02-07 DIAGNOSIS — D631 Anemia in chronic kidney disease: Secondary | ICD-10-CM | POA: Diagnosis not present

## 2018-02-07 DIAGNOSIS — N2581 Secondary hyperparathyroidism of renal origin: Secondary | ICD-10-CM | POA: Diagnosis not present

## 2018-02-10 DIAGNOSIS — D631 Anemia in chronic kidney disease: Secondary | ICD-10-CM | POA: Diagnosis not present

## 2018-02-10 DIAGNOSIS — E119 Type 2 diabetes mellitus without complications: Secondary | ICD-10-CM | POA: Diagnosis not present

## 2018-02-10 DIAGNOSIS — N2581 Secondary hyperparathyroidism of renal origin: Secondary | ICD-10-CM | POA: Diagnosis not present

## 2018-02-10 DIAGNOSIS — N186 End stage renal disease: Secondary | ICD-10-CM | POA: Diagnosis not present

## 2018-02-10 DIAGNOSIS — D509 Iron deficiency anemia, unspecified: Secondary | ICD-10-CM | POA: Diagnosis not present

## 2018-02-12 DIAGNOSIS — N2581 Secondary hyperparathyroidism of renal origin: Secondary | ICD-10-CM | POA: Diagnosis not present

## 2018-02-12 DIAGNOSIS — D509 Iron deficiency anemia, unspecified: Secondary | ICD-10-CM | POA: Diagnosis not present

## 2018-02-12 DIAGNOSIS — E119 Type 2 diabetes mellitus without complications: Secondary | ICD-10-CM | POA: Diagnosis not present

## 2018-02-12 DIAGNOSIS — N186 End stage renal disease: Secondary | ICD-10-CM | POA: Diagnosis not present

## 2018-02-12 DIAGNOSIS — D631 Anemia in chronic kidney disease: Secondary | ICD-10-CM | POA: Diagnosis not present

## 2018-02-14 DIAGNOSIS — D631 Anemia in chronic kidney disease: Secondary | ICD-10-CM | POA: Diagnosis not present

## 2018-02-14 DIAGNOSIS — E119 Type 2 diabetes mellitus without complications: Secondary | ICD-10-CM | POA: Diagnosis not present

## 2018-02-14 DIAGNOSIS — N2581 Secondary hyperparathyroidism of renal origin: Secondary | ICD-10-CM | POA: Diagnosis not present

## 2018-02-14 DIAGNOSIS — D509 Iron deficiency anemia, unspecified: Secondary | ICD-10-CM | POA: Diagnosis not present

## 2018-02-14 DIAGNOSIS — N186 End stage renal disease: Secondary | ICD-10-CM | POA: Diagnosis not present

## 2018-02-15 DIAGNOSIS — D509 Iron deficiency anemia, unspecified: Secondary | ICD-10-CM | POA: Diagnosis not present

## 2018-02-15 DIAGNOSIS — D631 Anemia in chronic kidney disease: Secondary | ICD-10-CM | POA: Diagnosis not present

## 2018-02-15 DIAGNOSIS — E119 Type 2 diabetes mellitus without complications: Secondary | ICD-10-CM | POA: Diagnosis not present

## 2018-02-15 DIAGNOSIS — N186 End stage renal disease: Secondary | ICD-10-CM | POA: Diagnosis not present

## 2018-02-15 DIAGNOSIS — N2581 Secondary hyperparathyroidism of renal origin: Secondary | ICD-10-CM | POA: Diagnosis not present

## 2018-02-19 DIAGNOSIS — E119 Type 2 diabetes mellitus without complications: Secondary | ICD-10-CM | POA: Diagnosis not present

## 2018-02-19 DIAGNOSIS — D509 Iron deficiency anemia, unspecified: Secondary | ICD-10-CM | POA: Diagnosis not present

## 2018-02-19 DIAGNOSIS — N186 End stage renal disease: Secondary | ICD-10-CM | POA: Diagnosis not present

## 2018-02-19 DIAGNOSIS — D631 Anemia in chronic kidney disease: Secondary | ICD-10-CM | POA: Diagnosis not present

## 2018-02-19 DIAGNOSIS — N2581 Secondary hyperparathyroidism of renal origin: Secondary | ICD-10-CM | POA: Diagnosis not present

## 2018-02-21 DIAGNOSIS — N186 End stage renal disease: Secondary | ICD-10-CM | POA: Diagnosis not present

## 2018-02-21 DIAGNOSIS — D631 Anemia in chronic kidney disease: Secondary | ICD-10-CM | POA: Diagnosis not present

## 2018-02-21 DIAGNOSIS — D509 Iron deficiency anemia, unspecified: Secondary | ICD-10-CM | POA: Diagnosis not present

## 2018-02-21 DIAGNOSIS — N2581 Secondary hyperparathyroidism of renal origin: Secondary | ICD-10-CM | POA: Diagnosis not present

## 2018-02-21 DIAGNOSIS — E119 Type 2 diabetes mellitus without complications: Secondary | ICD-10-CM | POA: Diagnosis not present

## 2018-02-24 DIAGNOSIS — E119 Type 2 diabetes mellitus without complications: Secondary | ICD-10-CM | POA: Diagnosis not present

## 2018-02-24 DIAGNOSIS — N186 End stage renal disease: Secondary | ICD-10-CM | POA: Diagnosis not present

## 2018-02-24 DIAGNOSIS — D509 Iron deficiency anemia, unspecified: Secondary | ICD-10-CM | POA: Diagnosis not present

## 2018-02-24 DIAGNOSIS — N2581 Secondary hyperparathyroidism of renal origin: Secondary | ICD-10-CM | POA: Diagnosis not present

## 2018-02-24 DIAGNOSIS — D631 Anemia in chronic kidney disease: Secondary | ICD-10-CM | POA: Diagnosis not present

## 2018-02-26 DIAGNOSIS — E119 Type 2 diabetes mellitus without complications: Secondary | ICD-10-CM | POA: Diagnosis not present

## 2018-02-26 DIAGNOSIS — D509 Iron deficiency anemia, unspecified: Secondary | ICD-10-CM | POA: Diagnosis not present

## 2018-02-26 DIAGNOSIS — N186 End stage renal disease: Secondary | ICD-10-CM | POA: Diagnosis not present

## 2018-02-26 DIAGNOSIS — N2581 Secondary hyperparathyroidism of renal origin: Secondary | ICD-10-CM | POA: Diagnosis not present

## 2018-02-26 DIAGNOSIS — D631 Anemia in chronic kidney disease: Secondary | ICD-10-CM | POA: Diagnosis not present

## 2018-02-28 DIAGNOSIS — D631 Anemia in chronic kidney disease: Secondary | ICD-10-CM | POA: Diagnosis not present

## 2018-02-28 DIAGNOSIS — E119 Type 2 diabetes mellitus without complications: Secondary | ICD-10-CM | POA: Diagnosis not present

## 2018-02-28 DIAGNOSIS — N2581 Secondary hyperparathyroidism of renal origin: Secondary | ICD-10-CM | POA: Diagnosis not present

## 2018-02-28 DIAGNOSIS — D509 Iron deficiency anemia, unspecified: Secondary | ICD-10-CM | POA: Diagnosis not present

## 2018-02-28 DIAGNOSIS — N186 End stage renal disease: Secondary | ICD-10-CM | POA: Diagnosis not present

## 2018-03-03 DIAGNOSIS — N2581 Secondary hyperparathyroidism of renal origin: Secondary | ICD-10-CM | POA: Diagnosis not present

## 2018-03-03 DIAGNOSIS — E119 Type 2 diabetes mellitus without complications: Secondary | ICD-10-CM | POA: Diagnosis not present

## 2018-03-03 DIAGNOSIS — N186 End stage renal disease: Secondary | ICD-10-CM | POA: Diagnosis not present

## 2018-03-03 DIAGNOSIS — D631 Anemia in chronic kidney disease: Secondary | ICD-10-CM | POA: Diagnosis not present

## 2018-03-03 DIAGNOSIS — D509 Iron deficiency anemia, unspecified: Secondary | ICD-10-CM | POA: Diagnosis not present

## 2018-03-05 DIAGNOSIS — D509 Iron deficiency anemia, unspecified: Secondary | ICD-10-CM | POA: Diagnosis not present

## 2018-03-05 DIAGNOSIS — N186 End stage renal disease: Secondary | ICD-10-CM | POA: Diagnosis not present

## 2018-03-05 DIAGNOSIS — D631 Anemia in chronic kidney disease: Secondary | ICD-10-CM | POA: Diagnosis not present

## 2018-03-05 DIAGNOSIS — N2581 Secondary hyperparathyroidism of renal origin: Secondary | ICD-10-CM | POA: Diagnosis not present

## 2018-03-05 DIAGNOSIS — E119 Type 2 diabetes mellitus without complications: Secondary | ICD-10-CM | POA: Diagnosis not present

## 2018-03-07 DIAGNOSIS — D631 Anemia in chronic kidney disease: Secondary | ICD-10-CM | POA: Diagnosis not present

## 2018-03-07 DIAGNOSIS — E119 Type 2 diabetes mellitus without complications: Secondary | ICD-10-CM | POA: Diagnosis not present

## 2018-03-07 DIAGNOSIS — N186 End stage renal disease: Secondary | ICD-10-CM | POA: Diagnosis not present

## 2018-03-07 DIAGNOSIS — D509 Iron deficiency anemia, unspecified: Secondary | ICD-10-CM | POA: Diagnosis not present

## 2018-03-07 DIAGNOSIS — N2581 Secondary hyperparathyroidism of renal origin: Secondary | ICD-10-CM | POA: Diagnosis not present

## 2018-03-09 DIAGNOSIS — Z992 Dependence on renal dialysis: Secondary | ICD-10-CM | POA: Diagnosis not present

## 2018-03-09 DIAGNOSIS — N186 End stage renal disease: Secondary | ICD-10-CM | POA: Diagnosis not present

## 2018-03-09 DIAGNOSIS — I120 Hypertensive chronic kidney disease with stage 5 chronic kidney disease or end stage renal disease: Secondary | ICD-10-CM | POA: Diagnosis not present

## 2018-03-10 DIAGNOSIS — D631 Anemia in chronic kidney disease: Secondary | ICD-10-CM | POA: Diagnosis not present

## 2018-03-10 DIAGNOSIS — D509 Iron deficiency anemia, unspecified: Secondary | ICD-10-CM | POA: Diagnosis not present

## 2018-03-10 DIAGNOSIS — E119 Type 2 diabetes mellitus without complications: Secondary | ICD-10-CM | POA: Diagnosis not present

## 2018-03-10 DIAGNOSIS — Z23 Encounter for immunization: Secondary | ICD-10-CM | POA: Diagnosis not present

## 2018-03-10 DIAGNOSIS — N2581 Secondary hyperparathyroidism of renal origin: Secondary | ICD-10-CM | POA: Diagnosis not present

## 2018-03-10 DIAGNOSIS — N186 End stage renal disease: Secondary | ICD-10-CM | POA: Diagnosis not present

## 2018-03-12 DIAGNOSIS — Z23 Encounter for immunization: Secondary | ICD-10-CM | POA: Diagnosis not present

## 2018-03-12 DIAGNOSIS — D509 Iron deficiency anemia, unspecified: Secondary | ICD-10-CM | POA: Diagnosis not present

## 2018-03-12 DIAGNOSIS — N186 End stage renal disease: Secondary | ICD-10-CM | POA: Diagnosis not present

## 2018-03-12 DIAGNOSIS — D631 Anemia in chronic kidney disease: Secondary | ICD-10-CM | POA: Diagnosis not present

## 2018-03-12 DIAGNOSIS — N2581 Secondary hyperparathyroidism of renal origin: Secondary | ICD-10-CM | POA: Diagnosis not present

## 2018-03-12 DIAGNOSIS — E119 Type 2 diabetes mellitus without complications: Secondary | ICD-10-CM | POA: Diagnosis not present

## 2018-03-14 DIAGNOSIS — N2581 Secondary hyperparathyroidism of renal origin: Secondary | ICD-10-CM | POA: Diagnosis not present

## 2018-03-14 DIAGNOSIS — E119 Type 2 diabetes mellitus without complications: Secondary | ICD-10-CM | POA: Diagnosis not present

## 2018-03-14 DIAGNOSIS — Z23 Encounter for immunization: Secondary | ICD-10-CM | POA: Diagnosis not present

## 2018-03-14 DIAGNOSIS — D509 Iron deficiency anemia, unspecified: Secondary | ICD-10-CM | POA: Diagnosis not present

## 2018-03-14 DIAGNOSIS — D631 Anemia in chronic kidney disease: Secondary | ICD-10-CM | POA: Diagnosis not present

## 2018-03-14 DIAGNOSIS — N186 End stage renal disease: Secondary | ICD-10-CM | POA: Diagnosis not present

## 2018-03-17 DIAGNOSIS — N2581 Secondary hyperparathyroidism of renal origin: Secondary | ICD-10-CM | POA: Diagnosis not present

## 2018-03-17 DIAGNOSIS — D509 Iron deficiency anemia, unspecified: Secondary | ICD-10-CM | POA: Diagnosis not present

## 2018-03-17 DIAGNOSIS — Z23 Encounter for immunization: Secondary | ICD-10-CM | POA: Diagnosis not present

## 2018-03-17 DIAGNOSIS — D631 Anemia in chronic kidney disease: Secondary | ICD-10-CM | POA: Diagnosis not present

## 2018-03-17 DIAGNOSIS — E119 Type 2 diabetes mellitus without complications: Secondary | ICD-10-CM | POA: Diagnosis not present

## 2018-03-17 DIAGNOSIS — N186 End stage renal disease: Secondary | ICD-10-CM | POA: Diagnosis not present

## 2018-03-18 ENCOUNTER — Ambulatory Visit (INDEPENDENT_AMBULATORY_CARE_PROVIDER_SITE_OTHER): Payer: Medicare Other | Admitting: Podiatry

## 2018-03-18 ENCOUNTER — Encounter: Payer: Self-pay | Admitting: Podiatry

## 2018-03-18 DIAGNOSIS — M2010 Hallux valgus (acquired), unspecified foot: Secondary | ICD-10-CM

## 2018-03-18 DIAGNOSIS — M79676 Pain in unspecified toe(s): Secondary | ICD-10-CM

## 2018-03-18 DIAGNOSIS — B351 Tinea unguium: Secondary | ICD-10-CM

## 2018-03-18 DIAGNOSIS — E1140 Type 2 diabetes mellitus with diabetic neuropathy, unspecified: Secondary | ICD-10-CM

## 2018-03-18 NOTE — Progress Notes (Signed)
Patient ID: Shawn [name], male   DOB: 06/12/1961, 57 y.o.   MRN: [id number] Complaint:  Visit Type: Patient returns to my office for continued preventative foot care services. Complaint: Patient states" my nails have grown long and thick and become painful to walk and wear shoes" Patient has been diagnosed with DM with neuropathy and angiopathy.. The patient presents for preventative foot care services. No changes to ROS  Podiatric Exam: Vascular: dorsalis pedis and posterior tibial pulses are not  palpable bilateral. Capillary return is immediate. Cold feet  B/L Skin turgor WNL  Sensorium: Diminished Semmes Weinstein monofilament test. Normal tactile sensation bilaterally. Nail Exam: Pt has thick disfigured discolored nails with subungual debris noted bilateral entire nail hallux through fifth toenails Ulcer Exam: There is no evidence of ulcer or pre-ulcerative changes or infection. Orthopedic Exam: Muscle tone and strength are WNL. No limitations in general ROM. No crepitus or effusions noted. Foot type and digits show no abnormalities. HAV B/L  Hammer toe 2  B/L Skin: No Porokeratosis. No infection or ulcers.    No signs of redness or infection or swelling.  Diagnosis:  Onychomycosis, , Pain in right toe, pain in left toes  Treatment & Plan Procedures and Treatment: Consent by patient was obtained for treatment procedures. The patient understood the discussion of treatment and procedures well. All questions were answered thoroughly reviewed. Debridement of mycotic and hypertrophic toenails, 1 through 5 bilateral and clearing of subungual debris. No ulceration, no infection noted.   Signed ABN for 2019. Return Visit-Office Procedure: Patient instructed to return to the office for a follow up visit 3 months for continued evaluation and treatment.  Gardiner Barefoot DPM

## 2018-03-19 DIAGNOSIS — D509 Iron deficiency anemia, unspecified: Secondary | ICD-10-CM | POA: Diagnosis not present

## 2018-03-19 DIAGNOSIS — Z23 Encounter for immunization: Secondary | ICD-10-CM | POA: Diagnosis not present

## 2018-03-19 DIAGNOSIS — D631 Anemia in chronic kidney disease: Secondary | ICD-10-CM | POA: Diagnosis not present

## 2018-03-19 DIAGNOSIS — N2581 Secondary hyperparathyroidism of renal origin: Secondary | ICD-10-CM | POA: Diagnosis not present

## 2018-03-19 DIAGNOSIS — N186 End stage renal disease: Secondary | ICD-10-CM | POA: Diagnosis not present

## 2018-03-19 DIAGNOSIS — E119 Type 2 diabetes mellitus without complications: Secondary | ICD-10-CM | POA: Diagnosis not present

## 2018-03-21 DIAGNOSIS — N2581 Secondary hyperparathyroidism of renal origin: Secondary | ICD-10-CM | POA: Diagnosis not present

## 2018-03-21 DIAGNOSIS — D509 Iron deficiency anemia, unspecified: Secondary | ICD-10-CM | POA: Diagnosis not present

## 2018-03-21 DIAGNOSIS — N186 End stage renal disease: Secondary | ICD-10-CM | POA: Diagnosis not present

## 2018-03-21 DIAGNOSIS — Z23 Encounter for immunization: Secondary | ICD-10-CM | POA: Diagnosis not present

## 2018-03-21 DIAGNOSIS — D631 Anemia in chronic kidney disease: Secondary | ICD-10-CM | POA: Diagnosis not present

## 2018-03-21 DIAGNOSIS — E119 Type 2 diabetes mellitus without complications: Secondary | ICD-10-CM | POA: Diagnosis not present

## 2018-03-24 DIAGNOSIS — D631 Anemia in chronic kidney disease: Secondary | ICD-10-CM | POA: Diagnosis not present

## 2018-03-24 DIAGNOSIS — Z23 Encounter for immunization: Secondary | ICD-10-CM | POA: Diagnosis not present

## 2018-03-24 DIAGNOSIS — N186 End stage renal disease: Secondary | ICD-10-CM | POA: Diagnosis not present

## 2018-03-24 DIAGNOSIS — N2581 Secondary hyperparathyroidism of renal origin: Secondary | ICD-10-CM | POA: Diagnosis not present

## 2018-03-24 DIAGNOSIS — E119 Type 2 diabetes mellitus without complications: Secondary | ICD-10-CM | POA: Diagnosis not present

## 2018-03-24 DIAGNOSIS — D509 Iron deficiency anemia, unspecified: Secondary | ICD-10-CM | POA: Diagnosis not present

## 2018-03-26 DIAGNOSIS — D631 Anemia in chronic kidney disease: Secondary | ICD-10-CM | POA: Diagnosis not present

## 2018-03-26 DIAGNOSIS — Z23 Encounter for immunization: Secondary | ICD-10-CM | POA: Diagnosis not present

## 2018-03-26 DIAGNOSIS — E119 Type 2 diabetes mellitus without complications: Secondary | ICD-10-CM | POA: Diagnosis not present

## 2018-03-26 DIAGNOSIS — N2581 Secondary hyperparathyroidism of renal origin: Secondary | ICD-10-CM | POA: Diagnosis not present

## 2018-03-26 DIAGNOSIS — D509 Iron deficiency anemia, unspecified: Secondary | ICD-10-CM | POA: Diagnosis not present

## 2018-03-26 DIAGNOSIS — N186 End stage renal disease: Secondary | ICD-10-CM | POA: Diagnosis not present

## 2018-03-28 DIAGNOSIS — D631 Anemia in chronic kidney disease: Secondary | ICD-10-CM | POA: Diagnosis not present

## 2018-03-28 DIAGNOSIS — N186 End stage renal disease: Secondary | ICD-10-CM | POA: Diagnosis not present

## 2018-03-28 DIAGNOSIS — D509 Iron deficiency anemia, unspecified: Secondary | ICD-10-CM | POA: Diagnosis not present

## 2018-03-28 DIAGNOSIS — N2581 Secondary hyperparathyroidism of renal origin: Secondary | ICD-10-CM | POA: Diagnosis not present

## 2018-03-28 DIAGNOSIS — Z23 Encounter for immunization: Secondary | ICD-10-CM | POA: Diagnosis not present

## 2018-03-28 DIAGNOSIS — E119 Type 2 diabetes mellitus without complications: Secondary | ICD-10-CM | POA: Diagnosis not present

## 2018-03-31 DIAGNOSIS — N186 End stage renal disease: Secondary | ICD-10-CM | POA: Diagnosis not present

## 2018-03-31 DIAGNOSIS — D509 Iron deficiency anemia, unspecified: Secondary | ICD-10-CM | POA: Diagnosis not present

## 2018-03-31 DIAGNOSIS — D631 Anemia in chronic kidney disease: Secondary | ICD-10-CM | POA: Diagnosis not present

## 2018-03-31 DIAGNOSIS — E119 Type 2 diabetes mellitus without complications: Secondary | ICD-10-CM | POA: Diagnosis not present

## 2018-03-31 DIAGNOSIS — Z23 Encounter for immunization: Secondary | ICD-10-CM | POA: Diagnosis not present

## 2018-03-31 DIAGNOSIS — N2581 Secondary hyperparathyroidism of renal origin: Secondary | ICD-10-CM | POA: Diagnosis not present

## 2018-04-02 DIAGNOSIS — E119 Type 2 diabetes mellitus without complications: Secondary | ICD-10-CM | POA: Diagnosis not present

## 2018-04-02 DIAGNOSIS — D631 Anemia in chronic kidney disease: Secondary | ICD-10-CM | POA: Diagnosis not present

## 2018-04-02 DIAGNOSIS — N2581 Secondary hyperparathyroidism of renal origin: Secondary | ICD-10-CM | POA: Diagnosis not present

## 2018-04-02 DIAGNOSIS — N186 End stage renal disease: Secondary | ICD-10-CM | POA: Diagnosis not present

## 2018-04-02 DIAGNOSIS — D509 Iron deficiency anemia, unspecified: Secondary | ICD-10-CM | POA: Diagnosis not present

## 2018-04-02 DIAGNOSIS — Z23 Encounter for immunization: Secondary | ICD-10-CM | POA: Diagnosis not present

## 2018-04-04 DIAGNOSIS — N186 End stage renal disease: Secondary | ICD-10-CM | POA: Diagnosis not present

## 2018-04-04 DIAGNOSIS — N2581 Secondary hyperparathyroidism of renal origin: Secondary | ICD-10-CM | POA: Diagnosis not present

## 2018-04-04 DIAGNOSIS — D631 Anemia in chronic kidney disease: Secondary | ICD-10-CM | POA: Diagnosis not present

## 2018-04-04 DIAGNOSIS — D509 Iron deficiency anemia, unspecified: Secondary | ICD-10-CM | POA: Diagnosis not present

## 2018-04-04 DIAGNOSIS — Z23 Encounter for immunization: Secondary | ICD-10-CM | POA: Diagnosis not present

## 2018-04-04 DIAGNOSIS — E119 Type 2 diabetes mellitus without complications: Secondary | ICD-10-CM | POA: Diagnosis not present

## 2018-04-07 DIAGNOSIS — D631 Anemia in chronic kidney disease: Secondary | ICD-10-CM | POA: Diagnosis not present

## 2018-04-07 DIAGNOSIS — E119 Type 2 diabetes mellitus without complications: Secondary | ICD-10-CM | POA: Diagnosis not present

## 2018-04-07 DIAGNOSIS — N2581 Secondary hyperparathyroidism of renal origin: Secondary | ICD-10-CM | POA: Diagnosis not present

## 2018-04-07 DIAGNOSIS — Z23 Encounter for immunization: Secondary | ICD-10-CM | POA: Diagnosis not present

## 2018-04-07 DIAGNOSIS — N186 End stage renal disease: Secondary | ICD-10-CM | POA: Diagnosis not present

## 2018-04-07 DIAGNOSIS — D509 Iron deficiency anemia, unspecified: Secondary | ICD-10-CM | POA: Diagnosis not present

## 2018-04-08 DIAGNOSIS — I120 Hypertensive chronic kidney disease with stage 5 chronic kidney disease or end stage renal disease: Secondary | ICD-10-CM | POA: Diagnosis not present

## 2018-04-08 DIAGNOSIS — N186 End stage renal disease: Secondary | ICD-10-CM | POA: Diagnosis not present

## 2018-04-08 DIAGNOSIS — Z992 Dependence on renal dialysis: Secondary | ICD-10-CM | POA: Diagnosis not present

## 2018-04-09 DIAGNOSIS — N2581 Secondary hyperparathyroidism of renal origin: Secondary | ICD-10-CM | POA: Diagnosis not present

## 2018-04-09 DIAGNOSIS — D631 Anemia in chronic kidney disease: Secondary | ICD-10-CM | POA: Diagnosis not present

## 2018-04-09 DIAGNOSIS — E119 Type 2 diabetes mellitus without complications: Secondary | ICD-10-CM | POA: Diagnosis not present

## 2018-04-09 DIAGNOSIS — D509 Iron deficiency anemia, unspecified: Secondary | ICD-10-CM | POA: Diagnosis not present

## 2018-04-09 DIAGNOSIS — N186 End stage renal disease: Secondary | ICD-10-CM | POA: Diagnosis not present

## 2018-04-10 ENCOUNTER — Ambulatory Visit: Payer: Medicare Other | Admitting: Cardiology

## 2018-04-11 DIAGNOSIS — E119 Type 2 diabetes mellitus without complications: Secondary | ICD-10-CM | POA: Diagnosis not present

## 2018-04-11 DIAGNOSIS — D631 Anemia in chronic kidney disease: Secondary | ICD-10-CM | POA: Diagnosis not present

## 2018-04-11 DIAGNOSIS — N186 End stage renal disease: Secondary | ICD-10-CM | POA: Diagnosis not present

## 2018-04-11 DIAGNOSIS — N2581 Secondary hyperparathyroidism of renal origin: Secondary | ICD-10-CM | POA: Diagnosis not present

## 2018-04-11 DIAGNOSIS — D509 Iron deficiency anemia, unspecified: Secondary | ICD-10-CM | POA: Diagnosis not present

## 2018-04-14 DIAGNOSIS — N2581 Secondary hyperparathyroidism of renal origin: Secondary | ICD-10-CM | POA: Diagnosis not present

## 2018-04-14 DIAGNOSIS — N186 End stage renal disease: Secondary | ICD-10-CM | POA: Diagnosis not present

## 2018-04-14 DIAGNOSIS — D509 Iron deficiency anemia, unspecified: Secondary | ICD-10-CM | POA: Diagnosis not present

## 2018-04-14 DIAGNOSIS — D631 Anemia in chronic kidney disease: Secondary | ICD-10-CM | POA: Diagnosis not present

## 2018-04-14 DIAGNOSIS — E119 Type 2 diabetes mellitus without complications: Secondary | ICD-10-CM | POA: Diagnosis not present

## 2018-04-16 DIAGNOSIS — N2581 Secondary hyperparathyroidism of renal origin: Secondary | ICD-10-CM | POA: Diagnosis not present

## 2018-04-16 DIAGNOSIS — N186 End stage renal disease: Secondary | ICD-10-CM | POA: Diagnosis not present

## 2018-04-16 DIAGNOSIS — D631 Anemia in chronic kidney disease: Secondary | ICD-10-CM | POA: Diagnosis not present

## 2018-04-16 DIAGNOSIS — E119 Type 2 diabetes mellitus without complications: Secondary | ICD-10-CM | POA: Diagnosis not present

## 2018-04-16 DIAGNOSIS — D509 Iron deficiency anemia, unspecified: Secondary | ICD-10-CM | POA: Diagnosis not present

## 2018-04-18 DIAGNOSIS — E119 Type 2 diabetes mellitus without complications: Secondary | ICD-10-CM | POA: Diagnosis not present

## 2018-04-18 DIAGNOSIS — N186 End stage renal disease: Secondary | ICD-10-CM | POA: Diagnosis not present

## 2018-04-18 DIAGNOSIS — D631 Anemia in chronic kidney disease: Secondary | ICD-10-CM | POA: Diagnosis not present

## 2018-04-18 DIAGNOSIS — N2581 Secondary hyperparathyroidism of renal origin: Secondary | ICD-10-CM | POA: Diagnosis not present

## 2018-04-18 DIAGNOSIS — D509 Iron deficiency anemia, unspecified: Secondary | ICD-10-CM | POA: Diagnosis not present

## 2018-04-21 DIAGNOSIS — D509 Iron deficiency anemia, unspecified: Secondary | ICD-10-CM | POA: Diagnosis not present

## 2018-04-21 DIAGNOSIS — D631 Anemia in chronic kidney disease: Secondary | ICD-10-CM | POA: Diagnosis not present

## 2018-04-21 DIAGNOSIS — N2581 Secondary hyperparathyroidism of renal origin: Secondary | ICD-10-CM | POA: Diagnosis not present

## 2018-04-21 DIAGNOSIS — N186 End stage renal disease: Secondary | ICD-10-CM | POA: Diagnosis not present

## 2018-04-21 DIAGNOSIS — E119 Type 2 diabetes mellitus without complications: Secondary | ICD-10-CM | POA: Diagnosis not present

## 2018-04-23 DIAGNOSIS — N2581 Secondary hyperparathyroidism of renal origin: Secondary | ICD-10-CM | POA: Diagnosis not present

## 2018-04-23 DIAGNOSIS — E119 Type 2 diabetes mellitus without complications: Secondary | ICD-10-CM | POA: Diagnosis not present

## 2018-04-23 DIAGNOSIS — D509 Iron deficiency anemia, unspecified: Secondary | ICD-10-CM | POA: Diagnosis not present

## 2018-04-23 DIAGNOSIS — N186 End stage renal disease: Secondary | ICD-10-CM | POA: Diagnosis not present

## 2018-04-23 DIAGNOSIS — D631 Anemia in chronic kidney disease: Secondary | ICD-10-CM | POA: Diagnosis not present

## 2018-04-25 DIAGNOSIS — D631 Anemia in chronic kidney disease: Secondary | ICD-10-CM | POA: Diagnosis not present

## 2018-04-25 DIAGNOSIS — N186 End stage renal disease: Secondary | ICD-10-CM | POA: Diagnosis not present

## 2018-04-25 DIAGNOSIS — N2581 Secondary hyperparathyroidism of renal origin: Secondary | ICD-10-CM | POA: Diagnosis not present

## 2018-04-25 DIAGNOSIS — D509 Iron deficiency anemia, unspecified: Secondary | ICD-10-CM | POA: Diagnosis not present

## 2018-04-25 DIAGNOSIS — E119 Type 2 diabetes mellitus without complications: Secondary | ICD-10-CM | POA: Diagnosis not present

## 2018-04-28 DIAGNOSIS — N186 End stage renal disease: Secondary | ICD-10-CM | POA: Diagnosis not present

## 2018-04-28 DIAGNOSIS — D509 Iron deficiency anemia, unspecified: Secondary | ICD-10-CM | POA: Diagnosis not present

## 2018-04-28 DIAGNOSIS — N2581 Secondary hyperparathyroidism of renal origin: Secondary | ICD-10-CM | POA: Diagnosis not present

## 2018-04-28 DIAGNOSIS — D631 Anemia in chronic kidney disease: Secondary | ICD-10-CM | POA: Diagnosis not present

## 2018-04-28 DIAGNOSIS — E119 Type 2 diabetes mellitus without complications: Secondary | ICD-10-CM | POA: Diagnosis not present

## 2018-04-29 ENCOUNTER — Ambulatory Visit (INDEPENDENT_AMBULATORY_CARE_PROVIDER_SITE_OTHER): Payer: Medicare Other | Admitting: Cardiology

## 2018-04-29 ENCOUNTER — Encounter: Payer: Self-pay | Admitting: Cardiology

## 2018-04-29 VITALS — BP 130/84 | HR 62 | Ht 70.0 in | Wt 211.0 lb

## 2018-04-29 DIAGNOSIS — Z79899 Other long term (current) drug therapy: Secondary | ICD-10-CM

## 2018-04-29 DIAGNOSIS — I10 Essential (primary) hypertension: Secondary | ICD-10-CM

## 2018-04-29 DIAGNOSIS — E782 Mixed hyperlipidemia: Secondary | ICD-10-CM | POA: Diagnosis not present

## 2018-04-29 DIAGNOSIS — Z7189 Other specified counseling: Secondary | ICD-10-CM

## 2018-04-29 MED ORDER — ATORVASTATIN CALCIUM 40 MG PO TABS: 40.0000 mg | ORAL_TABLET | Freq: Every day | ORAL | 3 refills | Status: DC

## 2018-04-29 NOTE — Progress Notes (Signed)
Cardiology Office Note:    Date:  04/29/2018   ID:  Shawn [name], DOB 04-Jul-1961, MRN [id number]  PCP:  Vincente Liberty, MD  Cardiologist:  Buford Dresser, MD   Referring MD: Vincente Liberty, MD   CC: follow up  History of Present Illness:    Shawn [name] is a 57 y.o. male with a hx of end stage renal disease on dialysis, CVA, hypertension, diabetes, legal blindness who is seen as in follow up at the request of Dr. Katherine Roan for follow up.  He was initially seen on 01/16/18 for palpitations and possible atrial fibrillation as well as recent left arm tingling and pain. He was recently discharged on 12/23/17 after presenting with left arm tingling and palpitations. Acute MI was ruled out, and it was attributed to a grief reaction given that his sister and a close friend had just passed away. Per the discharge summery, no atrial fibrillation was seen on telemetry.  Patient concerns today: when he lays on his back, he feels like it is harder to breathe. Also sometimes feels a rattling in his chest. Has had his weights decreasing in dialysis to try to improve his blood pressure, from about 105 kg to 95 kg. No chest pain. No edema. Palpitations much improved. Reviewed again his stress test and monitor results with him.   Past Medical History:  Diagnosis Date  . Anemia   . Arthritis    patient denies  . Blind right eye   . Chronic kidney disease    Dialysis since 2011 MWF  . Complication of anesthesia   . Diabetes mellitus    type II  . Hypertension   . Legally blind   . No pertinent past medical history   . PONV (postoperative nausea and vomiting)    N/V- became dehydrated had to have IV fluids- 01/2015  . Shortness of breath dyspnea    when has too fluid  . Stroke [facility]) 2012   date per patient  . Syncope   . 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 AV FISTULA  PLACEMENT Right 05/12/2013   Procedure: INSERTION OF ARTERIOVENOUS (AV) GORE-TEX GRAFT ARM; ULTRASOUND GUIDED;  Surgeon: Conrad McCook, MD;  Location: [place];  Service: Vascular;  Laterality: Right;  . AV FISTULA PLACEMENT Left 01/18/2015   Procedure: INSERTION OF ARTERIOVENOUS GORE-TEX GRAFT LEFT UPPER ARM;  Surgeon: Conrad Tangier, MD;  Location: [place];  Service: Vascular;  Laterality: Left;  . COLONOSCOPY  06/2012  . EYE SURGERY Bilateral    retina surgery both eyes, cataract surgery both eyes  . HERNIA REPAIR     right inguinal  . INSERTION OF DIALYSIS CATHETER Right   . left arm graft  10/2010  . LIGATION ARTERIOVENOUS GORTEX GRAFT Left 05/03/2015   Procedure: LIGATION ARTERIOVENOUS GORTEX GRAFT-LEFT UPPER ARM;  Surgeon: Conrad Barbourville, MD;  Location: [facility] OR;  Service: Vascular;  Laterality: Left;    Current Medications: Current Outpatient Medications on File Prior to Visit  Medication Sig  . amLODipine (NORVASC) 10 MG tablet Take 10 mg by mouth at bedtime.  Marland Kitchen aspirin EC 325 MG tablet Take 325 mg by mouth at bedtime.  Marland Kitchen b complex vitamins tablet Take 1 tablet by mouth at bedtime.  . brimonidine (ALPHAGAN) 0.2 % ophthalmic solution Place 1 drop into the left eye 2 (two) times daily.  . cinacalcet (SENSIPAR) 30 MG tablet Take 30 mg by mouth daily  with supper.  . diphenhydramine-acetaminophen (TYLENOL PM) 25-500 MG TABS tablet Take 1 tablet by mouth at bedtime as needed (pain).  . dorzolamide-timolol (COSOPT) 22.3-6.8 MG/ML ophthalmic solution Place 1 drop into the left eye 2 (two) times daily.  Marland Kitchen erythromycin ophthalmic ointment Place 1 application into the left eye at bedtime as needed (dry eyes/itching).   . insulin detemir (LEVEMIR) 100 UNIT/ML injection Inject 18 Units into the skin 2 (two) times daily with a meal.  . lanthanum (FOSRENOL) 1000 MG chewable tablet Chew 1,000 mg by mouth 3 (three) times daily with meals.  . latanoprost (XALATAN) 0.005 % ophthalmic solution Place 1 drop into the  left eye at bedtime.  . metoprolol tartrate (LOPRESSOR) 25 MG tablet Take 25 mg by mouth 2 (two) times daily.  . Soft Lens Products (REFRESH CONTACTS DROPS) SOLN Place 1 drop into the left eye 2 (two) times daily as needed (dry eyes).  . Vitamin D, Ergocalciferol, (DRISDOL) 50000 units CAPS capsule Take 50,000 Units by mouth every Thursday.   No current facility-administered medications on file prior to visit.      Allergies:   Patient has no known allergies.   Social History   Socioeconomic History  . Marital status: Married    Spouse name: Not on file  . Number of children: 0  . Years of education: 12th  . Highest education level: Not on file  Occupational History    Employer: UNEMPLOYED  Social Needs  . Financial resource strain: Not on file  . Food insecurity:    Worry: Not on file    Inability: Not on file  . Transportation needs:    Medical: Not on file    Non-medical: Not on file  Tobacco Use  . Smoking status: Never Smoker  . Smokeless tobacco: Never Used  Substance and Sexual Activity  . Alcohol use: No    Alcohol/week: 0.0 standard drinks  . Drug use: No  . Sexual activity: Not on file  Lifestyle  . Physical activity:    Days per week: Not on file    Minutes per session: Not on file  . Stress: Not on file  Relationships  . Social connections:    Talks on phone: Not on file    Gets together: Not on file    Attends religious service: Not on file    Active member of club or organization: Not on file    Attends meetings of clubs or organizations: Not on file    Relationship status: Not on file  Other Topics Concern  . Not on file  Social History Narrative   Patient lives at home with wife.    Patient is disabled.    Patient has no children.    Patient has a 12 grade education.         Family History: The patient's family history includes Alzheimer's disease in his mother; Cancer in his father and sister; Diabetes in his mother; Heart disease in his  father; Stroke in his sister. brother has a defibrillator, father had a heart problem, sister who just passed had a heart problem but thought to have passed from a blood clot. All 9 siblings and his parents had/have diabetes. He is the only one on dialysis.  ROS:   Please see the history of present illness.  Additional pertinent ROS: Review of Systems  Constitutional: Negative for chills, diaphoresis, fever and malaise/fatigue.  HENT: Negative for hearing loss.   Eyes: Negative for pain.  Legally blind  Respiratory: Negative for cough, shortness of breath and wheezing.   Cardiovascular: Positive for palpitations. Negative for chest pain, orthopnea, claudication, leg swelling and PND.  Gastrointestinal: Negative for abdominal pain, nausea and vomiting.  Genitourinary: Negative for flank pain.  Musculoskeletal: Negative for falls and joint pain.  Skin: Negative for itching.  Neurological: Negative for sensory change, focal weakness and loss of consciousness.  Endo/Heme/Allergies: Does not bruise/bleed easily.    EKGs/Labs/Other Studies Reviewed:    The following studies were reviewed today: Lexiscan MPI 01/22/18  There was no ST segment deviation noted during stress.  The left ventricular ejection fraction is mildly decreased (45-54%).  Nuclear stress EF: 51%, there is apicalseptal hypokinesis.  This is a low risk study. With no significant ischemia or perfusion defects identified, however ejection fraction is mildly reduced with apical septal hypokinesis.  Monitor 8.8.19 14 day monitor reviewed. Average heart rate 63 bpm. No tachycardia. 25% of usable tracings were sinus bradycardia, 75% normal sinus rhythm. No pauses. 6 patient triggered events, including one for chest pain. All were sinus rhythm. No significant abnormalities noted on monitor.  Echo from 12/22/17 personally reviewed, agree with report: Left ventricle: The cavity size was normal. There was mild   concentric  hypertrophy. Systolic function was mildly reduced. The   estimated ejection fraction was in the range of 45% to 50%. Mild   diffuse hypokinesis. Doppler parameters are consistent with   abnormal left ventricular relaxation (grade 1 diastolic   dysfunction). Doppler parameters are consistent with high   ventricular filling pressure. - Aortic valve: Transvalvular velocity was within the normal range.   There was no stenosis. There was no regurgitation. Valve area   (VTI): 2.17 cm^2. Valve area (Vmax): 2.16 cm^2. Valve area   (Vmean): 2.14 cm^2. - Mitral valve: Transvalvular velocity was within the normal range.   There was no evidence for stenosis. There was trivial   regurgitation. - Left atrium: The atrium was mildly dilated. - Right ventricle: The cavity size was normal. Wall thickness was   normal. Systolic function was normal. - Tricuspid valve: There was trivial regurgitation. - Pulmonary arteries: Systolic pressure was within the normal   range. PA peak pressure: 35 mm Hg (S).  ECG from 12/22/17 showed normal sinus rhythm with prolonged QT, personally reviewed.  EKG:  The ekg ordered previously demonstrates sinus bradycadia with long QT  Recent Labs: 12/22/2017: BUN 21; Creatinine, Ser 8.20; Hemoglobin 12.0; Platelets 293; Potassium 3.9; Sodium 132  Recent Lipid Panel    Component Value Date/Time   CHOL (H) 01/26/2010 0520    207        ATP III CLASSIFICATION:  <200     mg/dL   Desirable  200-239  mg/dL   Borderline High  >=240    mg/dL   High          TRIG 115 01/26/2010 0520   HDL 39 (L) 01/26/2010 0520   CHOLHDL 5.3 01/26/2010 0520   VLDL 23 01/26/2010 0520   LDLCALC (H) 01/26/2010 0520    145        Total Cholesterol/HDL:CHD Risk Coronary Heart Disease Risk Table                     Men   Women  1/2 Average Risk   3.4   3.3  Average Risk       5.0   4.4  2 X Average Risk   9.6   7.1  3  X Average Risk  23.4   11.0        Use the calculated Patient Ratio above  and the CHD Risk Table to determine the patient's CHD Risk.        ATP III CLASSIFICATION (LDL):  <100     mg/dL   Optimal  100-129  mg/dL   Near or Above                    Optimal  130-159  mg/dL   Borderline  160-189  mg/dL   High  >190     mg/dL   Very High    Physical Exam:    VS:  BP 130/84   Pulse 62   Ht 5\' 10"  (1.778 m)   Wt 211 lb (95.7 kg)   SpO2 96%   BMI 30.28 kg/m     Wt Readings from Last 3 Encounters:  04/29/18 211 lb (95.7 kg)  01/22/18 214 lb (97.1 kg)  01/16/18 214 lb (97.1 kg)     GEN: Well nourished, well developed in no acute distress HEENT: Normal NECK: No JVD; No carotid bruits LYMPHATICS: No lymphadenopathy CARDIAC: regular rhythm, normal S1 and S2, no murmurs, rubs, gallops RESPIRATORY:  Clear to auscultation without rales, wheezing or rhonchi  ABDOMEN: Soft, non-tender, non-distended MUSCULOSKELETAL:  No edema; No deformity  SKIN: Warm and dry NEUROLOGIC:  Alert and oriented x 3 PSYCHIATRIC:  Normal affect   ASSESSMENT:    1. Mixed hyperlipidemia    PLAN:    1. Hyperlipidemia:  LDL 104 12/2017, with history of stroke would aim for LDL <70.   -increase atorvastatin dose to 40 mg today, check lipids at HD in 6 weeks  -if still not at goal, but tolerating atorvastatin, could increase to 80 mg vs. Adding ezetimibe  2. Hypertension: improving with increased fluid removal at dialysis.  -continue amlodipine, metoprolol  3. Cardiovascular risk factors and prevention -risk factors: hyperlipidemia, vascular disease (CVA and ESRD on dialysis), diabetes with complications, hypertension -defer to neurology re: aspirin dosing, patient was told he should remain on aspirin 325 mg. From a cardiac perspective, ok for 81 mg daily aspirin -discussed lifestyle recommendations. Exercise ability limited given legal blindness. On renal diet.  Follow up 1 year or sooner PRN.  Medication Adjustments/Labs and Tests Ordered: Current medicines are reviewed at  length with the patient today.  Concerns regarding medicines are outlined above.  No orders of the defined types were placed in this encounter.  Meds ordered this encounter  Medications  . atorvastatin (LIPITOR) 40 MG tablet    Sig: Take 1 tablet (40 mg total) by mouth daily.    Dispense:  90 tablet    Refill:  3    Patient Instructions  Medication Instructions:  Increase- Lipitor 40 mg daily If you need a refill on your cardiac medications before your next appointment, please call your pharmacy.   Lab work: None   Testing/Procedures: None  Follow-Up: At [facility], you and your health needs are our priority.  As part of our continuing mission to provide you with exceptional heart care, we have created designated Provider Care Teams.  These Care Teams include your primary Cardiologist (physician) and Advanced Practice Providers (APPs -  Physician Assistants and Nurse Practitioners) who all work together to provide you with the care you need, when you need it. You will need a follow up appointment in 1 years.  Please call our office 2 months in advance to schedule  this appointment.  You may see Buford Dresser, MD or one of the following Advanced Practice Providers on your designated Care Team:   Rosaria Ferries, PA-C . Jory Sims, DNP, ANP  Any Other Special Instructions Will Be Listed Below (If Applicable).       Signed, Buford Dresser, MD PhD 04/29/2018 9:03 AM    [place]

## 2018-04-29 NOTE — Patient Instructions (Signed)
Medication Instructions:  Increase- Lipitor 40 mg daily If you need a refill on your cardiac medications before your next appointment, please call your pharmacy.   Lab work: None   Testing/Procedures: None  Follow-Up: At [facility], you and your health needs are our priority.  As part of our continuing mission to provide you with exceptional heart care, we have created designated Provider Care Teams.  These Care Teams include your primary Cardiologist (physician) and Advanced Practice Providers (APPs -  Physician Assistants and Nurse Practitioners) who all work together to provide you with the care you need, when you need it. You will need a follow up appointment in 1 years.  Please call our office 2 months in advance to schedule this appointment.  You may see Buford Dresser, MD or one of the following Advanced Practice Providers on your designated Care Team:   Rosaria Ferries, PA-C . Jory Sims, DNP, ANP  Any Other Special Instructions Will Be Listed Below (If Applicable).

## 2018-04-30 DIAGNOSIS — D631 Anemia in chronic kidney disease: Secondary | ICD-10-CM | POA: Diagnosis not present

## 2018-04-30 DIAGNOSIS — E119 Type 2 diabetes mellitus without complications: Secondary | ICD-10-CM | POA: Diagnosis not present

## 2018-04-30 DIAGNOSIS — D509 Iron deficiency anemia, unspecified: Secondary | ICD-10-CM | POA: Diagnosis not present

## 2018-04-30 DIAGNOSIS — E1129 Type 2 diabetes mellitus with other diabetic kidney complication: Secondary | ICD-10-CM | POA: Diagnosis not present

## 2018-04-30 DIAGNOSIS — N186 End stage renal disease: Secondary | ICD-10-CM | POA: Diagnosis not present

## 2018-04-30 DIAGNOSIS — N2581 Secondary hyperparathyroidism of renal origin: Secondary | ICD-10-CM | POA: Diagnosis not present

## 2018-05-02 DIAGNOSIS — E119 Type 2 diabetes mellitus without complications: Secondary | ICD-10-CM | POA: Diagnosis not present

## 2018-05-02 DIAGNOSIS — N2581 Secondary hyperparathyroidism of renal origin: Secondary | ICD-10-CM | POA: Diagnosis not present

## 2018-05-02 DIAGNOSIS — D631 Anemia in chronic kidney disease: Secondary | ICD-10-CM | POA: Diagnosis not present

## 2018-05-02 DIAGNOSIS — D509 Iron deficiency anemia, unspecified: Secondary | ICD-10-CM | POA: Diagnosis not present

## 2018-05-02 DIAGNOSIS — N186 End stage renal disease: Secondary | ICD-10-CM | POA: Diagnosis not present

## 2018-05-05 DIAGNOSIS — N2581 Secondary hyperparathyroidism of renal origin: Secondary | ICD-10-CM | POA: Diagnosis not present

## 2018-05-05 DIAGNOSIS — D631 Anemia in chronic kidney disease: Secondary | ICD-10-CM | POA: Diagnosis not present

## 2018-05-05 DIAGNOSIS — E119 Type 2 diabetes mellitus without complications: Secondary | ICD-10-CM | POA: Diagnosis not present

## 2018-05-05 DIAGNOSIS — N186 End stage renal disease: Secondary | ICD-10-CM | POA: Diagnosis not present

## 2018-05-05 DIAGNOSIS — D509 Iron deficiency anemia, unspecified: Secondary | ICD-10-CM | POA: Diagnosis not present

## 2018-05-07 DIAGNOSIS — N2581 Secondary hyperparathyroidism of renal origin: Secondary | ICD-10-CM | POA: Diagnosis not present

## 2018-05-07 DIAGNOSIS — N186 End stage renal disease: Secondary | ICD-10-CM | POA: Diagnosis not present

## 2018-05-07 DIAGNOSIS — E119 Type 2 diabetes mellitus without complications: Secondary | ICD-10-CM | POA: Diagnosis not present

## 2018-05-07 DIAGNOSIS — D509 Iron deficiency anemia, unspecified: Secondary | ICD-10-CM | POA: Diagnosis not present

## 2018-05-07 DIAGNOSIS — D631 Anemia in chronic kidney disease: Secondary | ICD-10-CM | POA: Diagnosis not present

## 2018-05-09 DIAGNOSIS — N2581 Secondary hyperparathyroidism of renal origin: Secondary | ICD-10-CM | POA: Diagnosis not present

## 2018-05-09 DIAGNOSIS — D509 Iron deficiency anemia, unspecified: Secondary | ICD-10-CM | POA: Diagnosis not present

## 2018-05-09 DIAGNOSIS — Z992 Dependence on renal dialysis: Secondary | ICD-10-CM | POA: Diagnosis not present

## 2018-05-09 DIAGNOSIS — N186 End stage renal disease: Secondary | ICD-10-CM | POA: Diagnosis not present

## 2018-05-09 DIAGNOSIS — D631 Anemia in chronic kidney disease: Secondary | ICD-10-CM | POA: Diagnosis not present

## 2018-05-09 DIAGNOSIS — I120 Hypertensive chronic kidney disease with stage 5 chronic kidney disease or end stage renal disease: Secondary | ICD-10-CM | POA: Diagnosis not present

## 2018-05-09 DIAGNOSIS — E119 Type 2 diabetes mellitus without complications: Secondary | ICD-10-CM | POA: Diagnosis not present

## 2018-05-12 DIAGNOSIS — E119 Type 2 diabetes mellitus without complications: Secondary | ICD-10-CM | POA: Diagnosis not present

## 2018-05-12 DIAGNOSIS — N2581 Secondary hyperparathyroidism of renal origin: Secondary | ICD-10-CM | POA: Diagnosis not present

## 2018-05-12 DIAGNOSIS — N186 End stage renal disease: Secondary | ICD-10-CM | POA: Diagnosis not present

## 2018-05-12 DIAGNOSIS — D631 Anemia in chronic kidney disease: Secondary | ICD-10-CM | POA: Diagnosis not present

## 2018-05-12 DIAGNOSIS — D509 Iron deficiency anemia, unspecified: Secondary | ICD-10-CM | POA: Diagnosis not present

## 2018-05-14 DIAGNOSIS — N2581 Secondary hyperparathyroidism of renal origin: Secondary | ICD-10-CM | POA: Diagnosis not present

## 2018-05-14 DIAGNOSIS — N186 End stage renal disease: Secondary | ICD-10-CM | POA: Diagnosis not present

## 2018-05-14 DIAGNOSIS — E119 Type 2 diabetes mellitus without complications: Secondary | ICD-10-CM | POA: Diagnosis not present

## 2018-05-14 DIAGNOSIS — D509 Iron deficiency anemia, unspecified: Secondary | ICD-10-CM | POA: Diagnosis not present

## 2018-05-14 DIAGNOSIS — D631 Anemia in chronic kidney disease: Secondary | ICD-10-CM | POA: Diagnosis not present

## 2018-05-15 DIAGNOSIS — E113553 Type 2 diabetes mellitus with stable proliferative diabetic retinopathy, bilateral: Secondary | ICD-10-CM | POA: Diagnosis not present

## 2018-05-15 DIAGNOSIS — H401122 Primary open-angle glaucoma, left eye, moderate stage: Secondary | ICD-10-CM | POA: Diagnosis not present

## 2018-05-15 DIAGNOSIS — H16122 Filamentary keratitis, left eye: Secondary | ICD-10-CM | POA: Diagnosis not present

## 2018-05-15 DIAGNOSIS — H54413A Blindness right eye category 3, normal vision left eye: Secondary | ICD-10-CM | POA: Diagnosis not present

## 2018-05-16 DIAGNOSIS — H179 Unspecified corneal scar and opacity: Secondary | ICD-10-CM | POA: Diagnosis not present

## 2018-05-16 DIAGNOSIS — H16223 Keratoconjunctivitis sicca, not specified as Sjogren's, bilateral: Secondary | ICD-10-CM | POA: Diagnosis not present

## 2018-05-16 DIAGNOSIS — H16122 Filamentary keratitis, left eye: Secondary | ICD-10-CM | POA: Diagnosis not present

## 2018-05-16 DIAGNOSIS — H16401 Unspecified corneal neovascularization, right eye: Secondary | ICD-10-CM | POA: Diagnosis not present

## 2018-05-17 DIAGNOSIS — E119 Type 2 diabetes mellitus without complications: Secondary | ICD-10-CM | POA: Diagnosis not present

## 2018-05-17 DIAGNOSIS — D509 Iron deficiency anemia, unspecified: Secondary | ICD-10-CM | POA: Diagnosis not present

## 2018-05-17 DIAGNOSIS — D631 Anemia in chronic kidney disease: Secondary | ICD-10-CM | POA: Diagnosis not present

## 2018-05-17 DIAGNOSIS — N186 End stage renal disease: Secondary | ICD-10-CM | POA: Diagnosis not present

## 2018-05-17 DIAGNOSIS — N2581 Secondary hyperparathyroidism of renal origin: Secondary | ICD-10-CM | POA: Diagnosis not present

## 2018-05-19 DIAGNOSIS — N2581 Secondary hyperparathyroidism of renal origin: Secondary | ICD-10-CM | POA: Diagnosis not present

## 2018-05-19 DIAGNOSIS — D509 Iron deficiency anemia, unspecified: Secondary | ICD-10-CM | POA: Diagnosis not present

## 2018-05-19 DIAGNOSIS — N186 End stage renal disease: Secondary | ICD-10-CM | POA: Diagnosis not present

## 2018-05-19 DIAGNOSIS — D631 Anemia in chronic kidney disease: Secondary | ICD-10-CM | POA: Diagnosis not present

## 2018-05-19 DIAGNOSIS — E119 Type 2 diabetes mellitus without complications: Secondary | ICD-10-CM | POA: Diagnosis not present

## 2018-05-20 DIAGNOSIS — H16122 Filamentary keratitis, left eye: Secondary | ICD-10-CM | POA: Diagnosis not present

## 2018-05-20 DIAGNOSIS — H16401 Unspecified corneal neovascularization, right eye: Secondary | ICD-10-CM | POA: Diagnosis not present

## 2018-05-20 DIAGNOSIS — H179 Unspecified corneal scar and opacity: Secondary | ICD-10-CM | POA: Diagnosis not present

## 2018-05-20 DIAGNOSIS — H16223 Keratoconjunctivitis sicca, not specified as Sjogren's, bilateral: Secondary | ICD-10-CM | POA: Diagnosis not present

## 2018-05-21 DIAGNOSIS — N186 End stage renal disease: Secondary | ICD-10-CM | POA: Diagnosis not present

## 2018-05-21 DIAGNOSIS — D509 Iron deficiency anemia, unspecified: Secondary | ICD-10-CM | POA: Diagnosis not present

## 2018-05-21 DIAGNOSIS — N2581 Secondary hyperparathyroidism of renal origin: Secondary | ICD-10-CM | POA: Diagnosis not present

## 2018-05-21 DIAGNOSIS — D631 Anemia in chronic kidney disease: Secondary | ICD-10-CM | POA: Diagnosis not present

## 2018-05-21 DIAGNOSIS — E119 Type 2 diabetes mellitus without complications: Secondary | ICD-10-CM | POA: Diagnosis not present

## 2018-05-23 DIAGNOSIS — D631 Anemia in chronic kidney disease: Secondary | ICD-10-CM | POA: Diagnosis not present

## 2018-05-23 DIAGNOSIS — D509 Iron deficiency anemia, unspecified: Secondary | ICD-10-CM | POA: Diagnosis not present

## 2018-05-23 DIAGNOSIS — N186 End stage renal disease: Secondary | ICD-10-CM | POA: Diagnosis not present

## 2018-05-23 DIAGNOSIS — N2581 Secondary hyperparathyroidism of renal origin: Secondary | ICD-10-CM | POA: Diagnosis not present

## 2018-05-23 DIAGNOSIS — E119 Type 2 diabetes mellitus without complications: Secondary | ICD-10-CM | POA: Diagnosis not present

## 2018-05-26 DIAGNOSIS — E119 Type 2 diabetes mellitus without complications: Secondary | ICD-10-CM | POA: Diagnosis not present

## 2018-05-26 DIAGNOSIS — D631 Anemia in chronic kidney disease: Secondary | ICD-10-CM | POA: Diagnosis not present

## 2018-05-26 DIAGNOSIS — N186 End stage renal disease: Secondary | ICD-10-CM | POA: Diagnosis not present

## 2018-05-26 DIAGNOSIS — D509 Iron deficiency anemia, unspecified: Secondary | ICD-10-CM | POA: Diagnosis not present

## 2018-05-26 DIAGNOSIS — N2581 Secondary hyperparathyroidism of renal origin: Secondary | ICD-10-CM | POA: Diagnosis not present

## 2018-05-28 DIAGNOSIS — D509 Iron deficiency anemia, unspecified: Secondary | ICD-10-CM | POA: Diagnosis not present

## 2018-05-28 DIAGNOSIS — E119 Type 2 diabetes mellitus without complications: Secondary | ICD-10-CM | POA: Diagnosis not present

## 2018-05-28 DIAGNOSIS — D631 Anemia in chronic kidney disease: Secondary | ICD-10-CM | POA: Diagnosis not present

## 2018-05-28 DIAGNOSIS — N186 End stage renal disease: Secondary | ICD-10-CM | POA: Diagnosis not present

## 2018-05-28 DIAGNOSIS — N2581 Secondary hyperparathyroidism of renal origin: Secondary | ICD-10-CM | POA: Diagnosis not present

## 2018-05-30 DIAGNOSIS — N2581 Secondary hyperparathyroidism of renal origin: Secondary | ICD-10-CM | POA: Diagnosis not present

## 2018-05-30 DIAGNOSIS — D631 Anemia in chronic kidney disease: Secondary | ICD-10-CM | POA: Diagnosis not present

## 2018-05-30 DIAGNOSIS — E119 Type 2 diabetes mellitus without complications: Secondary | ICD-10-CM | POA: Diagnosis not present

## 2018-05-30 DIAGNOSIS — N186 End stage renal disease: Secondary | ICD-10-CM | POA: Diagnosis not present

## 2018-05-30 DIAGNOSIS — D509 Iron deficiency anemia, unspecified: Secondary | ICD-10-CM | POA: Diagnosis not present

## 2018-06-01 DIAGNOSIS — E119 Type 2 diabetes mellitus without complications: Secondary | ICD-10-CM | POA: Diagnosis not present

## 2018-06-01 DIAGNOSIS — N186 End stage renal disease: Secondary | ICD-10-CM | POA: Diagnosis not present

## 2018-06-01 DIAGNOSIS — N2581 Secondary hyperparathyroidism of renal origin: Secondary | ICD-10-CM | POA: Diagnosis not present

## 2018-06-01 DIAGNOSIS — D509 Iron deficiency anemia, unspecified: Secondary | ICD-10-CM | POA: Diagnosis not present

## 2018-06-01 DIAGNOSIS — D631 Anemia in chronic kidney disease: Secondary | ICD-10-CM | POA: Diagnosis not present

## 2018-06-02 DIAGNOSIS — H40052 Ocular hypertension, left eye: Secondary | ICD-10-CM | POA: Diagnosis not present

## 2018-06-02 DIAGNOSIS — H179 Unspecified corneal scar and opacity: Secondary | ICD-10-CM | POA: Diagnosis not present

## 2018-06-02 DIAGNOSIS — H16223 Keratoconjunctivitis sicca, not specified as Sjogren's, bilateral: Secondary | ICD-10-CM | POA: Diagnosis not present

## 2018-06-02 DIAGNOSIS — H16122 Filamentary keratitis, left eye: Secondary | ICD-10-CM | POA: Diagnosis not present

## 2018-06-03 DIAGNOSIS — I6990 Unspecified sequelae of unspecified cerebrovascular disease: Secondary | ICD-10-CM | POA: Diagnosis not present

## 2018-06-03 DIAGNOSIS — E113559 Type 2 diabetes mellitus with stable proliferative diabetic retinopathy, unspecified eye: Secondary | ICD-10-CM | POA: Diagnosis not present

## 2018-06-03 DIAGNOSIS — I119 Hypertensive heart disease without heart failure: Secondary | ICD-10-CM | POA: Diagnosis not present

## 2018-06-03 DIAGNOSIS — H548 Legal blindness, as defined in USA: Secondary | ICD-10-CM | POA: Diagnosis not present

## 2018-06-03 DIAGNOSIS — D631 Anemia in chronic kidney disease: Secondary | ICD-10-CM | POA: Diagnosis not present

## 2018-06-03 DIAGNOSIS — E1165 Type 2 diabetes mellitus with hyperglycemia: Secondary | ICD-10-CM | POA: Diagnosis not present

## 2018-06-03 DIAGNOSIS — I2510 Atherosclerotic heart disease of native coronary artery without angina pectoris: Secondary | ICD-10-CM | POA: Diagnosis not present

## 2018-06-03 DIAGNOSIS — Z79899 Other long term (current) drug therapy: Secondary | ICD-10-CM | POA: Diagnosis not present

## 2018-06-03 DIAGNOSIS — E7800 Pure hypercholesterolemia, unspecified: Secondary | ICD-10-CM | POA: Diagnosis not present

## 2018-06-03 DIAGNOSIS — N2581 Secondary hyperparathyroidism of renal origin: Secondary | ICD-10-CM | POA: Diagnosis not present

## 2018-06-03 DIAGNOSIS — E559 Vitamin D deficiency, unspecified: Secondary | ICD-10-CM | POA: Diagnosis not present

## 2018-06-03 DIAGNOSIS — E1140 Type 2 diabetes mellitus with diabetic neuropathy, unspecified: Secondary | ICD-10-CM | POA: Diagnosis not present

## 2018-06-03 DIAGNOSIS — D509 Iron deficiency anemia, unspecified: Secondary | ICD-10-CM | POA: Diagnosis not present

## 2018-06-03 DIAGNOSIS — E119 Type 2 diabetes mellitus without complications: Secondary | ICD-10-CM | POA: Diagnosis not present

## 2018-06-03 DIAGNOSIS — N186 End stage renal disease: Secondary | ICD-10-CM | POA: Diagnosis not present

## 2018-06-06 DIAGNOSIS — N2581 Secondary hyperparathyroidism of renal origin: Secondary | ICD-10-CM | POA: Diagnosis not present

## 2018-06-06 DIAGNOSIS — D631 Anemia in chronic kidney disease: Secondary | ICD-10-CM | POA: Diagnosis not present

## 2018-06-06 DIAGNOSIS — N186 End stage renal disease: Secondary | ICD-10-CM | POA: Diagnosis not present

## 2018-06-06 DIAGNOSIS — E119 Type 2 diabetes mellitus without complications: Secondary | ICD-10-CM | POA: Diagnosis not present

## 2018-06-06 DIAGNOSIS — D509 Iron deficiency anemia, unspecified: Secondary | ICD-10-CM | POA: Diagnosis not present

## 2018-06-08 DIAGNOSIS — N186 End stage renal disease: Secondary | ICD-10-CM | POA: Diagnosis not present

## 2018-06-08 DIAGNOSIS — Z992 Dependence on renal dialysis: Secondary | ICD-10-CM | POA: Diagnosis not present

## 2018-06-08 DIAGNOSIS — I120 Hypertensive chronic kidney disease with stage 5 chronic kidney disease or end stage renal disease: Secondary | ICD-10-CM | POA: Diagnosis not present

## 2018-06-09 DIAGNOSIS — D509 Iron deficiency anemia, unspecified: Secondary | ICD-10-CM | POA: Diagnosis not present

## 2018-06-09 DIAGNOSIS — E119 Type 2 diabetes mellitus without complications: Secondary | ICD-10-CM | POA: Diagnosis not present

## 2018-06-09 DIAGNOSIS — N186 End stage renal disease: Secondary | ICD-10-CM | POA: Diagnosis not present

## 2018-06-09 DIAGNOSIS — D631 Anemia in chronic kidney disease: Secondary | ICD-10-CM | POA: Diagnosis not present

## 2018-06-09 DIAGNOSIS — N2581 Secondary hyperparathyroidism of renal origin: Secondary | ICD-10-CM | POA: Diagnosis not present

## 2018-06-11 DIAGNOSIS — D509 Iron deficiency anemia, unspecified: Secondary | ICD-10-CM | POA: Diagnosis not present

## 2018-06-11 DIAGNOSIS — E119 Type 2 diabetes mellitus without complications: Secondary | ICD-10-CM | POA: Diagnosis not present

## 2018-06-11 DIAGNOSIS — D631 Anemia in chronic kidney disease: Secondary | ICD-10-CM | POA: Diagnosis not present

## 2018-06-11 DIAGNOSIS — N186 End stage renal disease: Secondary | ICD-10-CM | POA: Diagnosis not present

## 2018-06-11 DIAGNOSIS — N2581 Secondary hyperparathyroidism of renal origin: Secondary | ICD-10-CM | POA: Diagnosis not present

## 2018-06-13 DIAGNOSIS — E119 Type 2 diabetes mellitus without complications: Secondary | ICD-10-CM | POA: Diagnosis not present

## 2018-06-13 DIAGNOSIS — D631 Anemia in chronic kidney disease: Secondary | ICD-10-CM | POA: Diagnosis not present

## 2018-06-13 DIAGNOSIS — N2581 Secondary hyperparathyroidism of renal origin: Secondary | ICD-10-CM | POA: Diagnosis not present

## 2018-06-13 DIAGNOSIS — D509 Iron deficiency anemia, unspecified: Secondary | ICD-10-CM | POA: Diagnosis not present

## 2018-06-13 DIAGNOSIS — N186 End stage renal disease: Secondary | ICD-10-CM | POA: Diagnosis not present

## 2018-06-16 DIAGNOSIS — E119 Type 2 diabetes mellitus without complications: Secondary | ICD-10-CM | POA: Diagnosis not present

## 2018-06-16 DIAGNOSIS — N186 End stage renal disease: Secondary | ICD-10-CM | POA: Diagnosis not present

## 2018-06-16 DIAGNOSIS — D509 Iron deficiency anemia, unspecified: Secondary | ICD-10-CM | POA: Diagnosis not present

## 2018-06-16 DIAGNOSIS — D631 Anemia in chronic kidney disease: Secondary | ICD-10-CM | POA: Diagnosis not present

## 2018-06-16 DIAGNOSIS — N2581 Secondary hyperparathyroidism of renal origin: Secondary | ICD-10-CM | POA: Diagnosis not present

## 2018-06-17 ENCOUNTER — Encounter: Payer: Self-pay | Admitting: Podiatry

## 2018-06-17 ENCOUNTER — Ambulatory Visit (INDEPENDENT_AMBULATORY_CARE_PROVIDER_SITE_OTHER): Payer: Medicare Other | Admitting: Sports Medicine

## 2018-06-17 DIAGNOSIS — N186 End stage renal disease: Secondary | ICD-10-CM

## 2018-06-17 DIAGNOSIS — M79676 Pain in unspecified toe(s): Secondary | ICD-10-CM | POA: Diagnosis not present

## 2018-06-17 DIAGNOSIS — H548 Legal blindness, as defined in USA: Secondary | ICD-10-CM

## 2018-06-17 DIAGNOSIS — B351 Tinea unguium: Secondary | ICD-10-CM

## 2018-06-17 DIAGNOSIS — I739 Peripheral vascular disease, unspecified: Secondary | ICD-10-CM

## 2018-06-17 DIAGNOSIS — E1140 Type 2 diabetes mellitus with diabetic neuropathy, unspecified: Secondary | ICD-10-CM

## 2018-06-17 DIAGNOSIS — Z992 Dependence on renal dialysis: Secondary | ICD-10-CM

## 2018-06-17 NOTE — Progress Notes (Signed)
Subjective: Shawn [name] is a 57 y.o. male patient with history of diabetes who presents to office today complaining of long,mildly painful nails  while ambulating in shoes; unable to trim. Patient states that the glucose reading this morning was not recorded ranges 120-30, a1c 6.7. Patient denies any new changes in medication or new problems. Patient denies any new cramping, numbness, burning or tingling in the legs, reports issues with eyes, no vision on right due to stroke and retina issues with history of detachment.  Patient Active Problem List   Diagnosis Date Noted  . Palpitations 12/21/2017  . Tingling of left upper extremity 12/21/2017  . Syncope 01/23/2016  . Hypertension 01/23/2016  . Orthostatic hypotension 01/23/2016  . Legally blind   . Diabetes ([place])   . Diabetes mellitus with complication ([place])   . Intracranial atherosclerosis 08/10/2014  . End stage renal disease ([place]) 06/12/2013  . ESRD on dialysis ([place]) 04/17/2013  . Cerebral artery occlusion with cerebral infarction ([place]) 04/02/2013  . Dizziness and giddiness 04/02/2013   Current Outpatient Medications on File Prior to Visit  Medication Sig Dispense Refill  . ALPHAGAN P 0.1 % SOLN INSTILL 1 DROP INTO LEFT EYE TWICE DAILY  0  . amLODipine (NORVASC) 10 MG tablet Take 10 mg by mouth at bedtime.    Marland Kitchen aspirin EC 325 MG tablet Take 325 mg by mouth at bedtime.    Marland Kitchen atorvastatin (LIPITOR) 40 MG tablet Take 1 tablet (40 mg total) by mouth daily. 90 tablet 3  . b complex vitamins tablet Take 1 tablet by mouth at bedtime.    . cinacalcet (SENSIPAR) 30 MG tablet Take 30 mg by mouth daily with supper.    . diphenhydramine-acetaminophen (TYLENOL PM) 25-500 MG TABS tablet Take 1 tablet by mouth at bedtime as needed (pain).    . dorzolamide-timolol (COSOPT) 22.3-6.8 MG/ML ophthalmic solution Place 1 drop into the left eye 2 (two) times daily.    Marland Kitchen erythromycin ophthalmic ointment Place 1 application into the left eye at  bedtime as needed (dry eyes/itching).   36  . insulin detemir (LEVEMIR) 100 UNIT/ML injection Inject 18 Units into the skin 2 (two) times daily with a meal.    . lanthanum (FOSRENOL) 1000 MG chewable tablet Chew 1,000 mg by mouth 3 (three) times daily with meals.    . latanoprost (XALATAN) 0.005 % ophthalmic solution Place 1 drop into the left eye at bedtime.    Marland Kitchen lisinopril (PRINIVIL,ZESTRIL) 10 MG tablet Take 10 mg by mouth at bedtime as needed.  1  . metoprolol tartrate (LOPRESSOR) 25 MG tablet Take 25 mg by mouth 2 (two) times daily.  11  . moxifloxacin (VIGAMOX) 0.5 % ophthalmic solution INSTILL 1 DROP INTO LEFT EYE 4 TIMES DAILY  1  . RESTASIS 0.05 % ophthalmic emulsion INSTILL 1 DROP INTO EACH EYE TWICE DAILY  4  . Soft Lens Products (REFRESH CONTACTS DROPS) SOLN Place 1 drop into the left eye 2 (two) times daily as needed (dry eyes).    . Vitamin D, Ergocalciferol, (DRISDOL) 50000 units CAPS capsule Take 50,000 Units by mouth every Thursday.     No current facility-administered medications on file prior to visit.    No Known Allergies  No results found for this or any previous visit (from the past 2160 hour(s)).  Objective: General: Patient is awake, alert, and oriented x 3 and in no acute distress.  Integument: Skin is warm, dry and supple bilateral. Nails are tender, long, thickened and  dystrophic  with subungual debris, consistent with onychomycosis, 1-5 bilateral. No signs of infection. No open lesions or preulcerative lesions present bilateral. Hyperpigmented spots bilateral. Remaining integument unremarkable.  Vasculature:  Dorsalis Pedis pulse 0/4 bilateral. Posterior Tibial pulse  0/4 bilateral. No ischemia. No gangrene.  Capillary fill time <5 sec 1-5 bilateral. No hair growth to the level of the digits. Temperature gradient within normal limits. No varicosities present bilateral. No edema present bilateral.   Neurology: The patient has diminished sensation measured with a  5.07/10g Semmes Weinstein Monofilament at all pedal sites bilateral . Vibratory sensation diminished bilateral with tuning fork. No Babinski sign present bilateral.   Musculoskeletal: Asymptomatic HAV pedal deformities noted bilateral. Muscular strength 5/5 in all lower extremity muscular groups on left but 4/6 on right secondary of stroke without pain on range of motion . No tenderness with calf compression bilateral.  Assessment and Plan: Problem List Items Addressed This Visit      Genitourinary   ESRD on dialysis [facility])     Other   Legally blind    Other Visit Diagnoses    Pain due to onychomycosis of toenail    -  Primary   Type 2 diabetes, controlled, with neuropathy (HCC)       Relevant Medications   lisinopril (PRINIVIL,ZESTRIL) 10 MG tablet   PVD (peripheral vascular disease) (HCC)       Relevant Medications   lisinopril (PRINIVIL,ZESTRIL) 10 MG tablet      -Examined patient. -Discussed and educated patient on diabetic foot care, especially with  regards to the vascular, neurological and musculoskeletal systems.  -Stressed the importance of good glycemic control and the detriment of not  controlling glucose levels in relation to the foot. -Mechanically debrided all nails 1-5 bilateral using sterile nail nipper and filed with dremel without incident  -Patient to return  in 3 months for at risk foot care -Patient advised to call the office if any problems or questions arise in the meantime.  Landis Martins, DPM

## 2018-06-18 DIAGNOSIS — E119 Type 2 diabetes mellitus without complications: Secondary | ICD-10-CM | POA: Diagnosis not present

## 2018-06-18 DIAGNOSIS — N186 End stage renal disease: Secondary | ICD-10-CM | POA: Diagnosis not present

## 2018-06-18 DIAGNOSIS — D509 Iron deficiency anemia, unspecified: Secondary | ICD-10-CM | POA: Diagnosis not present

## 2018-06-18 DIAGNOSIS — D631 Anemia in chronic kidney disease: Secondary | ICD-10-CM | POA: Diagnosis not present

## 2018-06-18 DIAGNOSIS — N2581 Secondary hyperparathyroidism of renal origin: Secondary | ICD-10-CM | POA: Diagnosis not present

## 2018-06-19 DIAGNOSIS — H16223 Keratoconjunctivitis sicca, not specified as Sjogren's, bilateral: Secondary | ICD-10-CM | POA: Diagnosis not present

## 2018-06-19 DIAGNOSIS — H16122 Filamentary keratitis, left eye: Secondary | ICD-10-CM | POA: Diagnosis not present

## 2018-06-19 DIAGNOSIS — H401123 Primary open-angle glaucoma, left eye, severe stage: Secondary | ICD-10-CM | POA: Diagnosis not present

## 2018-06-20 DIAGNOSIS — N2581 Secondary hyperparathyroidism of renal origin: Secondary | ICD-10-CM | POA: Diagnosis not present

## 2018-06-20 DIAGNOSIS — N186 End stage renal disease: Secondary | ICD-10-CM | POA: Diagnosis not present

## 2018-06-20 DIAGNOSIS — E119 Type 2 diabetes mellitus without complications: Secondary | ICD-10-CM | POA: Diagnosis not present

## 2018-06-20 DIAGNOSIS — D509 Iron deficiency anemia, unspecified: Secondary | ICD-10-CM | POA: Diagnosis not present

## 2018-06-20 DIAGNOSIS — D631 Anemia in chronic kidney disease: Secondary | ICD-10-CM | POA: Diagnosis not present

## 2018-06-23 DIAGNOSIS — D509 Iron deficiency anemia, unspecified: Secondary | ICD-10-CM | POA: Diagnosis not present

## 2018-06-23 DIAGNOSIS — D631 Anemia in chronic kidney disease: Secondary | ICD-10-CM | POA: Diagnosis not present

## 2018-06-23 DIAGNOSIS — N2581 Secondary hyperparathyroidism of renal origin: Secondary | ICD-10-CM | POA: Diagnosis not present

## 2018-06-23 DIAGNOSIS — N186 End stage renal disease: Secondary | ICD-10-CM | POA: Diagnosis not present

## 2018-06-23 DIAGNOSIS — E119 Type 2 diabetes mellitus without complications: Secondary | ICD-10-CM | POA: Diagnosis not present

## 2018-06-25 DIAGNOSIS — D631 Anemia in chronic kidney disease: Secondary | ICD-10-CM | POA: Diagnosis not present

## 2018-06-25 DIAGNOSIS — E119 Type 2 diabetes mellitus without complications: Secondary | ICD-10-CM | POA: Diagnosis not present

## 2018-06-25 DIAGNOSIS — N186 End stage renal disease: Secondary | ICD-10-CM | POA: Diagnosis not present

## 2018-06-25 DIAGNOSIS — N2581 Secondary hyperparathyroidism of renal origin: Secondary | ICD-10-CM | POA: Diagnosis not present

## 2018-06-25 DIAGNOSIS — D509 Iron deficiency anemia, unspecified: Secondary | ICD-10-CM | POA: Diagnosis not present

## 2018-06-27 DIAGNOSIS — D509 Iron deficiency anemia, unspecified: Secondary | ICD-10-CM | POA: Diagnosis not present

## 2018-06-27 DIAGNOSIS — N2581 Secondary hyperparathyroidism of renal origin: Secondary | ICD-10-CM | POA: Diagnosis not present

## 2018-06-27 DIAGNOSIS — D631 Anemia in chronic kidney disease: Secondary | ICD-10-CM | POA: Diagnosis not present

## 2018-06-27 DIAGNOSIS — N186 End stage renal disease: Secondary | ICD-10-CM | POA: Diagnosis not present

## 2018-06-27 DIAGNOSIS — E119 Type 2 diabetes mellitus without complications: Secondary | ICD-10-CM | POA: Diagnosis not present

## 2018-06-29 DIAGNOSIS — D509 Iron deficiency anemia, unspecified: Secondary | ICD-10-CM | POA: Diagnosis not present

## 2018-06-29 DIAGNOSIS — N2581 Secondary hyperparathyroidism of renal origin: Secondary | ICD-10-CM | POA: Diagnosis not present

## 2018-06-29 DIAGNOSIS — N186 End stage renal disease: Secondary | ICD-10-CM | POA: Diagnosis not present

## 2018-06-29 DIAGNOSIS — E119 Type 2 diabetes mellitus without complications: Secondary | ICD-10-CM | POA: Diagnosis not present

## 2018-06-29 DIAGNOSIS — D631 Anemia in chronic kidney disease: Secondary | ICD-10-CM | POA: Diagnosis not present

## 2018-07-01 DIAGNOSIS — N2581 Secondary hyperparathyroidism of renal origin: Secondary | ICD-10-CM | POA: Diagnosis not present

## 2018-07-01 DIAGNOSIS — E119 Type 2 diabetes mellitus without complications: Secondary | ICD-10-CM | POA: Diagnosis not present

## 2018-07-01 DIAGNOSIS — D509 Iron deficiency anemia, unspecified: Secondary | ICD-10-CM | POA: Diagnosis not present

## 2018-07-01 DIAGNOSIS — D631 Anemia in chronic kidney disease: Secondary | ICD-10-CM | POA: Diagnosis not present

## 2018-07-01 DIAGNOSIS — N186 End stage renal disease: Secondary | ICD-10-CM | POA: Diagnosis not present

## 2018-07-04 DIAGNOSIS — N2581 Secondary hyperparathyroidism of renal origin: Secondary | ICD-10-CM | POA: Diagnosis not present

## 2018-07-04 DIAGNOSIS — E119 Type 2 diabetes mellitus without complications: Secondary | ICD-10-CM | POA: Diagnosis not present

## 2018-07-04 DIAGNOSIS — N186 End stage renal disease: Secondary | ICD-10-CM | POA: Diagnosis not present

## 2018-07-04 DIAGNOSIS — D509 Iron deficiency anemia, unspecified: Secondary | ICD-10-CM | POA: Diagnosis not present

## 2018-07-04 DIAGNOSIS — D631 Anemia in chronic kidney disease: Secondary | ICD-10-CM | POA: Diagnosis not present

## 2018-07-06 DIAGNOSIS — D631 Anemia in chronic kidney disease: Secondary | ICD-10-CM | POA: Diagnosis not present

## 2018-07-06 DIAGNOSIS — N2581 Secondary hyperparathyroidism of renal origin: Secondary | ICD-10-CM | POA: Diagnosis not present

## 2018-07-06 DIAGNOSIS — N186 End stage renal disease: Secondary | ICD-10-CM | POA: Diagnosis not present

## 2018-07-06 DIAGNOSIS — D509 Iron deficiency anemia, unspecified: Secondary | ICD-10-CM | POA: Diagnosis not present

## 2018-07-06 DIAGNOSIS — E119 Type 2 diabetes mellitus without complications: Secondary | ICD-10-CM | POA: Diagnosis not present

## 2018-07-08 DIAGNOSIS — D631 Anemia in chronic kidney disease: Secondary | ICD-10-CM | POA: Diagnosis not present

## 2018-07-08 DIAGNOSIS — N186 End stage renal disease: Secondary | ICD-10-CM | POA: Diagnosis not present

## 2018-07-08 DIAGNOSIS — N2581 Secondary hyperparathyroidism of renal origin: Secondary | ICD-10-CM | POA: Diagnosis not present

## 2018-07-08 DIAGNOSIS — E119 Type 2 diabetes mellitus without complications: Secondary | ICD-10-CM | POA: Diagnosis not present

## 2018-07-08 DIAGNOSIS — D509 Iron deficiency anemia, unspecified: Secondary | ICD-10-CM | POA: Diagnosis not present

## 2018-07-09 DIAGNOSIS — N186 End stage renal disease: Secondary | ICD-10-CM | POA: Diagnosis not present

## 2018-07-09 DIAGNOSIS — Z992 Dependence on renal dialysis: Secondary | ICD-10-CM | POA: Diagnosis not present

## 2018-07-09 DIAGNOSIS — I120 Hypertensive chronic kidney disease with stage 5 chronic kidney disease or end stage renal disease: Secondary | ICD-10-CM | POA: Diagnosis not present

## 2018-07-11 DIAGNOSIS — D509 Iron deficiency anemia, unspecified: Secondary | ICD-10-CM | POA: Diagnosis not present

## 2018-07-11 DIAGNOSIS — E119 Type 2 diabetes mellitus without complications: Secondary | ICD-10-CM | POA: Diagnosis not present

## 2018-07-11 DIAGNOSIS — D631 Anemia in chronic kidney disease: Secondary | ICD-10-CM | POA: Diagnosis not present

## 2018-07-11 DIAGNOSIS — N2581 Secondary hyperparathyroidism of renal origin: Secondary | ICD-10-CM | POA: Diagnosis not present

## 2018-07-11 DIAGNOSIS — N186 End stage renal disease: Secondary | ICD-10-CM | POA: Diagnosis not present

## 2018-07-14 DIAGNOSIS — E119 Type 2 diabetes mellitus without complications: Secondary | ICD-10-CM | POA: Diagnosis not present

## 2018-07-14 DIAGNOSIS — N186 End stage renal disease: Secondary | ICD-10-CM | POA: Diagnosis not present

## 2018-07-14 DIAGNOSIS — N2581 Secondary hyperparathyroidism of renal origin: Secondary | ICD-10-CM | POA: Diagnosis not present

## 2018-07-14 DIAGNOSIS — D509 Iron deficiency anemia, unspecified: Secondary | ICD-10-CM | POA: Diagnosis not present

## 2018-07-14 DIAGNOSIS — D631 Anemia in chronic kidney disease: Secondary | ICD-10-CM | POA: Diagnosis not present

## 2018-07-16 DIAGNOSIS — E119 Type 2 diabetes mellitus without complications: Secondary | ICD-10-CM | POA: Diagnosis not present

## 2018-07-16 DIAGNOSIS — D509 Iron deficiency anemia, unspecified: Secondary | ICD-10-CM | POA: Diagnosis not present

## 2018-07-16 DIAGNOSIS — N2581 Secondary hyperparathyroidism of renal origin: Secondary | ICD-10-CM | POA: Diagnosis not present

## 2018-07-16 DIAGNOSIS — D631 Anemia in chronic kidney disease: Secondary | ICD-10-CM | POA: Diagnosis not present

## 2018-07-16 DIAGNOSIS — N186 End stage renal disease: Secondary | ICD-10-CM | POA: Diagnosis not present

## 2018-07-18 DIAGNOSIS — N2581 Secondary hyperparathyroidism of renal origin: Secondary | ICD-10-CM | POA: Diagnosis not present

## 2018-07-18 DIAGNOSIS — N186 End stage renal disease: Secondary | ICD-10-CM | POA: Diagnosis not present

## 2018-07-18 DIAGNOSIS — E119 Type 2 diabetes mellitus without complications: Secondary | ICD-10-CM | POA: Diagnosis not present

## 2018-07-18 DIAGNOSIS — D509 Iron deficiency anemia, unspecified: Secondary | ICD-10-CM | POA: Diagnosis not present

## 2018-07-18 DIAGNOSIS — D631 Anemia in chronic kidney disease: Secondary | ICD-10-CM | POA: Diagnosis not present

## 2018-07-21 DIAGNOSIS — D509 Iron deficiency anemia, unspecified: Secondary | ICD-10-CM | POA: Diagnosis not present

## 2018-07-21 DIAGNOSIS — E119 Type 2 diabetes mellitus without complications: Secondary | ICD-10-CM | POA: Diagnosis not present

## 2018-07-21 DIAGNOSIS — D631 Anemia in chronic kidney disease: Secondary | ICD-10-CM | POA: Diagnosis not present

## 2018-07-21 DIAGNOSIS — N2581 Secondary hyperparathyroidism of renal origin: Secondary | ICD-10-CM | POA: Diagnosis not present

## 2018-07-21 DIAGNOSIS — N186 End stage renal disease: Secondary | ICD-10-CM | POA: Diagnosis not present

## 2018-07-23 DIAGNOSIS — D631 Anemia in chronic kidney disease: Secondary | ICD-10-CM | POA: Diagnosis not present

## 2018-07-23 DIAGNOSIS — E119 Type 2 diabetes mellitus without complications: Secondary | ICD-10-CM | POA: Diagnosis not present

## 2018-07-23 DIAGNOSIS — N2581 Secondary hyperparathyroidism of renal origin: Secondary | ICD-10-CM | POA: Diagnosis not present

## 2018-07-23 DIAGNOSIS — N186 End stage renal disease: Secondary | ICD-10-CM | POA: Diagnosis not present

## 2018-07-23 DIAGNOSIS — D509 Iron deficiency anemia, unspecified: Secondary | ICD-10-CM | POA: Diagnosis not present

## 2018-07-25 DIAGNOSIS — D631 Anemia in chronic kidney disease: Secondary | ICD-10-CM | POA: Diagnosis not present

## 2018-07-25 DIAGNOSIS — D509 Iron deficiency anemia, unspecified: Secondary | ICD-10-CM | POA: Diagnosis not present

## 2018-07-25 DIAGNOSIS — N186 End stage renal disease: Secondary | ICD-10-CM | POA: Diagnosis not present

## 2018-07-25 DIAGNOSIS — N2581 Secondary hyperparathyroidism of renal origin: Secondary | ICD-10-CM | POA: Diagnosis not present

## 2018-07-25 DIAGNOSIS — E119 Type 2 diabetes mellitus without complications: Secondary | ICD-10-CM | POA: Diagnosis not present

## 2018-07-28 DIAGNOSIS — N186 End stage renal disease: Secondary | ICD-10-CM | POA: Diagnosis not present

## 2018-07-28 DIAGNOSIS — N2581 Secondary hyperparathyroidism of renal origin: Secondary | ICD-10-CM | POA: Diagnosis not present

## 2018-07-28 DIAGNOSIS — D509 Iron deficiency anemia, unspecified: Secondary | ICD-10-CM | POA: Diagnosis not present

## 2018-07-28 DIAGNOSIS — D631 Anemia in chronic kidney disease: Secondary | ICD-10-CM | POA: Diagnosis not present

## 2018-07-28 DIAGNOSIS — E119 Type 2 diabetes mellitus without complications: Secondary | ICD-10-CM | POA: Diagnosis not present

## 2018-07-30 DIAGNOSIS — E1129 Type 2 diabetes mellitus with other diabetic kidney complication: Secondary | ICD-10-CM | POA: Diagnosis not present

## 2018-07-30 DIAGNOSIS — D509 Iron deficiency anemia, unspecified: Secondary | ICD-10-CM | POA: Diagnosis not present

## 2018-07-30 DIAGNOSIS — E119 Type 2 diabetes mellitus without complications: Secondary | ICD-10-CM | POA: Diagnosis not present

## 2018-07-30 DIAGNOSIS — N2581 Secondary hyperparathyroidism of renal origin: Secondary | ICD-10-CM | POA: Diagnosis not present

## 2018-07-30 DIAGNOSIS — D631 Anemia in chronic kidney disease: Secondary | ICD-10-CM | POA: Diagnosis not present

## 2018-07-30 DIAGNOSIS — N186 End stage renal disease: Secondary | ICD-10-CM | POA: Diagnosis not present

## 2018-07-31 DIAGNOSIS — D509 Iron deficiency anemia, unspecified: Secondary | ICD-10-CM | POA: Diagnosis not present

## 2018-07-31 DIAGNOSIS — D631 Anemia in chronic kidney disease: Secondary | ICD-10-CM | POA: Diagnosis not present

## 2018-07-31 DIAGNOSIS — N186 End stage renal disease: Secondary | ICD-10-CM | POA: Diagnosis not present

## 2018-07-31 DIAGNOSIS — E119 Type 2 diabetes mellitus without complications: Secondary | ICD-10-CM | POA: Diagnosis not present

## 2018-07-31 DIAGNOSIS — N2581 Secondary hyperparathyroidism of renal origin: Secondary | ICD-10-CM | POA: Diagnosis not present

## 2018-08-04 DIAGNOSIS — E119 Type 2 diabetes mellitus without complications: Secondary | ICD-10-CM | POA: Diagnosis not present

## 2018-08-04 DIAGNOSIS — N2581 Secondary hyperparathyroidism of renal origin: Secondary | ICD-10-CM | POA: Diagnosis not present

## 2018-08-04 DIAGNOSIS — N186 End stage renal disease: Secondary | ICD-10-CM | POA: Diagnosis not present

## 2018-08-04 DIAGNOSIS — D631 Anemia in chronic kidney disease: Secondary | ICD-10-CM | POA: Diagnosis not present

## 2018-08-04 DIAGNOSIS — D509 Iron deficiency anemia, unspecified: Secondary | ICD-10-CM | POA: Diagnosis not present

## 2018-08-06 DIAGNOSIS — N186 End stage renal disease: Secondary | ICD-10-CM | POA: Diagnosis not present

## 2018-08-06 DIAGNOSIS — E119 Type 2 diabetes mellitus without complications: Secondary | ICD-10-CM | POA: Diagnosis not present

## 2018-08-06 DIAGNOSIS — N2581 Secondary hyperparathyroidism of renal origin: Secondary | ICD-10-CM | POA: Diagnosis not present

## 2018-08-06 DIAGNOSIS — D631 Anemia in chronic kidney disease: Secondary | ICD-10-CM | POA: Diagnosis not present

## 2018-08-06 DIAGNOSIS — D509 Iron deficiency anemia, unspecified: Secondary | ICD-10-CM | POA: Diagnosis not present

## 2018-08-08 DIAGNOSIS — D509 Iron deficiency anemia, unspecified: Secondary | ICD-10-CM | POA: Diagnosis not present

## 2018-08-08 DIAGNOSIS — N186 End stage renal disease: Secondary | ICD-10-CM | POA: Diagnosis not present

## 2018-08-08 DIAGNOSIS — N2581 Secondary hyperparathyroidism of renal origin: Secondary | ICD-10-CM | POA: Diagnosis not present

## 2018-08-08 DIAGNOSIS — D631 Anemia in chronic kidney disease: Secondary | ICD-10-CM | POA: Diagnosis not present

## 2018-08-08 DIAGNOSIS — E119 Type 2 diabetes mellitus without complications: Secondary | ICD-10-CM | POA: Diagnosis not present

## 2018-08-09 DIAGNOSIS — N186 End stage renal disease: Secondary | ICD-10-CM | POA: Diagnosis not present

## 2018-08-09 DIAGNOSIS — I120 Hypertensive chronic kidney disease with stage 5 chronic kidney disease or end stage renal disease: Secondary | ICD-10-CM | POA: Diagnosis not present

## 2018-08-09 DIAGNOSIS — Z992 Dependence on renal dialysis: Secondary | ICD-10-CM | POA: Diagnosis not present

## 2018-08-11 DIAGNOSIS — D509 Iron deficiency anemia, unspecified: Secondary | ICD-10-CM | POA: Diagnosis not present

## 2018-08-11 DIAGNOSIS — E119 Type 2 diabetes mellitus without complications: Secondary | ICD-10-CM | POA: Diagnosis not present

## 2018-08-11 DIAGNOSIS — D631 Anemia in chronic kidney disease: Secondary | ICD-10-CM | POA: Diagnosis not present

## 2018-08-11 DIAGNOSIS — N2581 Secondary hyperparathyroidism of renal origin: Secondary | ICD-10-CM | POA: Diagnosis not present

## 2018-08-11 DIAGNOSIS — N186 End stage renal disease: Secondary | ICD-10-CM | POA: Diagnosis not present

## 2018-08-13 DIAGNOSIS — N2581 Secondary hyperparathyroidism of renal origin: Secondary | ICD-10-CM | POA: Diagnosis not present

## 2018-08-13 DIAGNOSIS — E119 Type 2 diabetes mellitus without complications: Secondary | ICD-10-CM | POA: Diagnosis not present

## 2018-08-13 DIAGNOSIS — D631 Anemia in chronic kidney disease: Secondary | ICD-10-CM | POA: Diagnosis not present

## 2018-08-13 DIAGNOSIS — N186 End stage renal disease: Secondary | ICD-10-CM | POA: Diagnosis not present

## 2018-08-13 DIAGNOSIS — D509 Iron deficiency anemia, unspecified: Secondary | ICD-10-CM | POA: Diagnosis not present

## 2018-08-15 DIAGNOSIS — D509 Iron deficiency anemia, unspecified: Secondary | ICD-10-CM | POA: Diagnosis not present

## 2018-08-15 DIAGNOSIS — D631 Anemia in chronic kidney disease: Secondary | ICD-10-CM | POA: Diagnosis not present

## 2018-08-15 DIAGNOSIS — E119 Type 2 diabetes mellitus without complications: Secondary | ICD-10-CM | POA: Diagnosis not present

## 2018-08-15 DIAGNOSIS — N186 End stage renal disease: Secondary | ICD-10-CM | POA: Diagnosis not present

## 2018-08-15 DIAGNOSIS — N2581 Secondary hyperparathyroidism of renal origin: Secondary | ICD-10-CM | POA: Diagnosis not present

## 2018-08-18 DIAGNOSIS — N186 End stage renal disease: Secondary | ICD-10-CM | POA: Diagnosis not present

## 2018-08-18 DIAGNOSIS — N2581 Secondary hyperparathyroidism of renal origin: Secondary | ICD-10-CM | POA: Diagnosis not present

## 2018-08-18 DIAGNOSIS — D631 Anemia in chronic kidney disease: Secondary | ICD-10-CM | POA: Diagnosis not present

## 2018-08-18 DIAGNOSIS — E119 Type 2 diabetes mellitus without complications: Secondary | ICD-10-CM | POA: Diagnosis not present

## 2018-08-18 DIAGNOSIS — D509 Iron deficiency anemia, unspecified: Secondary | ICD-10-CM | POA: Diagnosis not present

## 2018-08-20 DIAGNOSIS — N186 End stage renal disease: Secondary | ICD-10-CM | POA: Diagnosis not present

## 2018-08-20 DIAGNOSIS — D631 Anemia in chronic kidney disease: Secondary | ICD-10-CM | POA: Diagnosis not present

## 2018-08-20 DIAGNOSIS — E119 Type 2 diabetes mellitus without complications: Secondary | ICD-10-CM | POA: Diagnosis not present

## 2018-08-20 DIAGNOSIS — D509 Iron deficiency anemia, unspecified: Secondary | ICD-10-CM | POA: Diagnosis not present

## 2018-08-20 DIAGNOSIS — N2581 Secondary hyperparathyroidism of renal origin: Secondary | ICD-10-CM | POA: Diagnosis not present

## 2018-08-22 DIAGNOSIS — D631 Anemia in chronic kidney disease: Secondary | ICD-10-CM | POA: Diagnosis not present

## 2018-08-22 DIAGNOSIS — D509 Iron deficiency anemia, unspecified: Secondary | ICD-10-CM | POA: Diagnosis not present

## 2018-08-22 DIAGNOSIS — N186 End stage renal disease: Secondary | ICD-10-CM | POA: Diagnosis not present

## 2018-08-22 DIAGNOSIS — N2581 Secondary hyperparathyroidism of renal origin: Secondary | ICD-10-CM | POA: Diagnosis not present

## 2018-08-22 DIAGNOSIS — E119 Type 2 diabetes mellitus without complications: Secondary | ICD-10-CM | POA: Diagnosis not present

## 2018-08-25 DIAGNOSIS — N2581 Secondary hyperparathyroidism of renal origin: Secondary | ICD-10-CM | POA: Diagnosis not present

## 2018-08-25 DIAGNOSIS — D631 Anemia in chronic kidney disease: Secondary | ICD-10-CM | POA: Diagnosis not present

## 2018-08-25 DIAGNOSIS — N186 End stage renal disease: Secondary | ICD-10-CM | POA: Diagnosis not present

## 2018-08-25 DIAGNOSIS — D509 Iron deficiency anemia, unspecified: Secondary | ICD-10-CM | POA: Diagnosis not present

## 2018-08-25 DIAGNOSIS — E119 Type 2 diabetes mellitus without complications: Secondary | ICD-10-CM | POA: Diagnosis not present

## 2018-08-27 DIAGNOSIS — N2581 Secondary hyperparathyroidism of renal origin: Secondary | ICD-10-CM | POA: Diagnosis not present

## 2018-08-27 DIAGNOSIS — D509 Iron deficiency anemia, unspecified: Secondary | ICD-10-CM | POA: Diagnosis not present

## 2018-08-27 DIAGNOSIS — E119 Type 2 diabetes mellitus without complications: Secondary | ICD-10-CM | POA: Diagnosis not present

## 2018-08-27 DIAGNOSIS — D631 Anemia in chronic kidney disease: Secondary | ICD-10-CM | POA: Diagnosis not present

## 2018-08-27 DIAGNOSIS — N186 End stage renal disease: Secondary | ICD-10-CM | POA: Diagnosis not present

## 2018-08-30 DIAGNOSIS — E119 Type 2 diabetes mellitus without complications: Secondary | ICD-10-CM | POA: Diagnosis not present

## 2018-08-30 DIAGNOSIS — N186 End stage renal disease: Secondary | ICD-10-CM | POA: Diagnosis not present

## 2018-08-30 DIAGNOSIS — D631 Anemia in chronic kidney disease: Secondary | ICD-10-CM | POA: Diagnosis not present

## 2018-08-30 DIAGNOSIS — D509 Iron deficiency anemia, unspecified: Secondary | ICD-10-CM | POA: Diagnosis not present

## 2018-08-30 DIAGNOSIS — N2581 Secondary hyperparathyroidism of renal origin: Secondary | ICD-10-CM | POA: Diagnosis not present

## 2018-09-01 DIAGNOSIS — D509 Iron deficiency anemia, unspecified: Secondary | ICD-10-CM | POA: Diagnosis not present

## 2018-09-01 DIAGNOSIS — N2581 Secondary hyperparathyroidism of renal origin: Secondary | ICD-10-CM | POA: Diagnosis not present

## 2018-09-01 DIAGNOSIS — E119 Type 2 diabetes mellitus without complications: Secondary | ICD-10-CM | POA: Diagnosis not present

## 2018-09-01 DIAGNOSIS — N186 End stage renal disease: Secondary | ICD-10-CM | POA: Diagnosis not present

## 2018-09-01 DIAGNOSIS — D631 Anemia in chronic kidney disease: Secondary | ICD-10-CM | POA: Diagnosis not present

## 2018-09-03 DIAGNOSIS — N186 End stage renal disease: Secondary | ICD-10-CM | POA: Diagnosis not present

## 2018-09-03 DIAGNOSIS — N2581 Secondary hyperparathyroidism of renal origin: Secondary | ICD-10-CM | POA: Diagnosis not present

## 2018-09-03 DIAGNOSIS — E119 Type 2 diabetes mellitus without complications: Secondary | ICD-10-CM | POA: Diagnosis not present

## 2018-09-03 DIAGNOSIS — D509 Iron deficiency anemia, unspecified: Secondary | ICD-10-CM | POA: Diagnosis not present

## 2018-09-03 DIAGNOSIS — D631 Anemia in chronic kidney disease: Secondary | ICD-10-CM | POA: Diagnosis not present

## 2018-09-05 DIAGNOSIS — D509 Iron deficiency anemia, unspecified: Secondary | ICD-10-CM | POA: Diagnosis not present

## 2018-09-05 DIAGNOSIS — E119 Type 2 diabetes mellitus without complications: Secondary | ICD-10-CM | POA: Diagnosis not present

## 2018-09-05 DIAGNOSIS — D631 Anemia in chronic kidney disease: Secondary | ICD-10-CM | POA: Diagnosis not present

## 2018-09-05 DIAGNOSIS — N186 End stage renal disease: Secondary | ICD-10-CM | POA: Diagnosis not present

## 2018-09-05 DIAGNOSIS — N2581 Secondary hyperparathyroidism of renal origin: Secondary | ICD-10-CM | POA: Diagnosis not present

## 2018-09-07 DIAGNOSIS — I120 Hypertensive chronic kidney disease with stage 5 chronic kidney disease or end stage renal disease: Secondary | ICD-10-CM | POA: Diagnosis not present

## 2018-09-07 DIAGNOSIS — Z992 Dependence on renal dialysis: Secondary | ICD-10-CM | POA: Diagnosis not present

## 2018-09-07 DIAGNOSIS — N186 End stage renal disease: Secondary | ICD-10-CM | POA: Diagnosis not present

## 2018-09-08 DIAGNOSIS — N186 End stage renal disease: Secondary | ICD-10-CM | POA: Diagnosis not present

## 2018-09-08 DIAGNOSIS — E119 Type 2 diabetes mellitus without complications: Secondary | ICD-10-CM | POA: Diagnosis not present

## 2018-09-08 DIAGNOSIS — D631 Anemia in chronic kidney disease: Secondary | ICD-10-CM | POA: Diagnosis not present

## 2018-09-08 DIAGNOSIS — D509 Iron deficiency anemia, unspecified: Secondary | ICD-10-CM | POA: Diagnosis not present

## 2018-09-08 DIAGNOSIS — N2581 Secondary hyperparathyroidism of renal origin: Secondary | ICD-10-CM | POA: Diagnosis not present

## 2018-09-12 DIAGNOSIS — N2581 Secondary hyperparathyroidism of renal origin: Secondary | ICD-10-CM | POA: Diagnosis not present

## 2018-09-12 DIAGNOSIS — D509 Iron deficiency anemia, unspecified: Secondary | ICD-10-CM | POA: Diagnosis not present

## 2018-09-12 DIAGNOSIS — D631 Anemia in chronic kidney disease: Secondary | ICD-10-CM | POA: Diagnosis not present

## 2018-09-12 DIAGNOSIS — E119 Type 2 diabetes mellitus without complications: Secondary | ICD-10-CM | POA: Diagnosis not present

## 2018-09-12 DIAGNOSIS — N186 End stage renal disease: Secondary | ICD-10-CM | POA: Diagnosis not present

## 2018-09-15 DIAGNOSIS — D631 Anemia in chronic kidney disease: Secondary | ICD-10-CM | POA: Diagnosis not present

## 2018-09-15 DIAGNOSIS — N2581 Secondary hyperparathyroidism of renal origin: Secondary | ICD-10-CM | POA: Diagnosis not present

## 2018-09-15 DIAGNOSIS — D509 Iron deficiency anemia, unspecified: Secondary | ICD-10-CM | POA: Diagnosis not present

## 2018-09-15 DIAGNOSIS — N186 End stage renal disease: Secondary | ICD-10-CM | POA: Diagnosis not present

## 2018-09-15 DIAGNOSIS — E119 Type 2 diabetes mellitus without complications: Secondary | ICD-10-CM | POA: Diagnosis not present

## 2018-09-17 DIAGNOSIS — N2581 Secondary hyperparathyroidism of renal origin: Secondary | ICD-10-CM | POA: Diagnosis not present

## 2018-09-17 DIAGNOSIS — D509 Iron deficiency anemia, unspecified: Secondary | ICD-10-CM | POA: Diagnosis not present

## 2018-09-17 DIAGNOSIS — D631 Anemia in chronic kidney disease: Secondary | ICD-10-CM | POA: Diagnosis not present

## 2018-09-17 DIAGNOSIS — E119 Type 2 diabetes mellitus without complications: Secondary | ICD-10-CM | POA: Diagnosis not present

## 2018-09-17 DIAGNOSIS — N186 End stage renal disease: Secondary | ICD-10-CM | POA: Diagnosis not present

## 2018-09-19 DIAGNOSIS — N2581 Secondary hyperparathyroidism of renal origin: Secondary | ICD-10-CM | POA: Diagnosis not present

## 2018-09-19 DIAGNOSIS — D631 Anemia in chronic kidney disease: Secondary | ICD-10-CM | POA: Diagnosis not present

## 2018-09-19 DIAGNOSIS — E119 Type 2 diabetes mellitus without complications: Secondary | ICD-10-CM | POA: Diagnosis not present

## 2018-09-19 DIAGNOSIS — D509 Iron deficiency anemia, unspecified: Secondary | ICD-10-CM | POA: Diagnosis not present

## 2018-09-19 DIAGNOSIS — N186 End stage renal disease: Secondary | ICD-10-CM | POA: Diagnosis not present

## 2018-09-22 DIAGNOSIS — N186 End stage renal disease: Secondary | ICD-10-CM | POA: Diagnosis not present

## 2018-09-22 DIAGNOSIS — N2581 Secondary hyperparathyroidism of renal origin: Secondary | ICD-10-CM | POA: Diagnosis not present

## 2018-09-22 DIAGNOSIS — E119 Type 2 diabetes mellitus without complications: Secondary | ICD-10-CM | POA: Diagnosis not present

## 2018-09-22 DIAGNOSIS — D509 Iron deficiency anemia, unspecified: Secondary | ICD-10-CM | POA: Diagnosis not present

## 2018-09-22 DIAGNOSIS — D631 Anemia in chronic kidney disease: Secondary | ICD-10-CM | POA: Diagnosis not present

## 2018-09-24 DIAGNOSIS — E119 Type 2 diabetes mellitus without complications: Secondary | ICD-10-CM | POA: Diagnosis not present

## 2018-09-24 DIAGNOSIS — N2581 Secondary hyperparathyroidism of renal origin: Secondary | ICD-10-CM | POA: Diagnosis not present

## 2018-09-24 DIAGNOSIS — N186 End stage renal disease: Secondary | ICD-10-CM | POA: Diagnosis not present

## 2018-09-24 DIAGNOSIS — D509 Iron deficiency anemia, unspecified: Secondary | ICD-10-CM | POA: Diagnosis not present

## 2018-09-24 DIAGNOSIS — D631 Anemia in chronic kidney disease: Secondary | ICD-10-CM | POA: Diagnosis not present

## 2018-09-26 DIAGNOSIS — D631 Anemia in chronic kidney disease: Secondary | ICD-10-CM | POA: Diagnosis not present

## 2018-09-26 DIAGNOSIS — N2581 Secondary hyperparathyroidism of renal origin: Secondary | ICD-10-CM | POA: Diagnosis not present

## 2018-09-26 DIAGNOSIS — D509 Iron deficiency anemia, unspecified: Secondary | ICD-10-CM | POA: Diagnosis not present

## 2018-09-26 DIAGNOSIS — N186 End stage renal disease: Secondary | ICD-10-CM | POA: Diagnosis not present

## 2018-09-26 DIAGNOSIS — E119 Type 2 diabetes mellitus without complications: Secondary | ICD-10-CM | POA: Diagnosis not present

## 2018-09-29 DIAGNOSIS — D509 Iron deficiency anemia, unspecified: Secondary | ICD-10-CM | POA: Diagnosis not present

## 2018-09-29 DIAGNOSIS — N186 End stage renal disease: Secondary | ICD-10-CM | POA: Diagnosis not present

## 2018-09-29 DIAGNOSIS — D631 Anemia in chronic kidney disease: Secondary | ICD-10-CM | POA: Diagnosis not present

## 2018-09-29 DIAGNOSIS — E119 Type 2 diabetes mellitus without complications: Secondary | ICD-10-CM | POA: Diagnosis not present

## 2018-09-29 DIAGNOSIS — N2581 Secondary hyperparathyroidism of renal origin: Secondary | ICD-10-CM | POA: Diagnosis not present

## 2018-09-30 ENCOUNTER — Ambulatory Visit: Payer: Medicare Other | Admitting: Podiatry

## 2018-10-01 DIAGNOSIS — N186 End stage renal disease: Secondary | ICD-10-CM | POA: Diagnosis not present

## 2018-10-01 DIAGNOSIS — D631 Anemia in chronic kidney disease: Secondary | ICD-10-CM | POA: Diagnosis not present

## 2018-10-01 DIAGNOSIS — N2581 Secondary hyperparathyroidism of renal origin: Secondary | ICD-10-CM | POA: Diagnosis not present

## 2018-10-01 DIAGNOSIS — E119 Type 2 diabetes mellitus without complications: Secondary | ICD-10-CM | POA: Diagnosis not present

## 2018-10-01 DIAGNOSIS — D509 Iron deficiency anemia, unspecified: Secondary | ICD-10-CM | POA: Diagnosis not present

## 2018-10-03 DIAGNOSIS — D631 Anemia in chronic kidney disease: Secondary | ICD-10-CM | POA: Diagnosis not present

## 2018-10-03 DIAGNOSIS — N186 End stage renal disease: Secondary | ICD-10-CM | POA: Diagnosis not present

## 2018-10-03 DIAGNOSIS — E119 Type 2 diabetes mellitus without complications: Secondary | ICD-10-CM | POA: Diagnosis not present

## 2018-10-03 DIAGNOSIS — D509 Iron deficiency anemia, unspecified: Secondary | ICD-10-CM | POA: Diagnosis not present

## 2018-10-03 DIAGNOSIS — N2581 Secondary hyperparathyroidism of renal origin: Secondary | ICD-10-CM | POA: Diagnosis not present

## 2018-10-06 DIAGNOSIS — E119 Type 2 diabetes mellitus without complications: Secondary | ICD-10-CM | POA: Diagnosis not present

## 2018-10-06 DIAGNOSIS — D509 Iron deficiency anemia, unspecified: Secondary | ICD-10-CM | POA: Diagnosis not present

## 2018-10-06 DIAGNOSIS — N186 End stage renal disease: Secondary | ICD-10-CM | POA: Diagnosis not present

## 2018-10-06 DIAGNOSIS — N2581 Secondary hyperparathyroidism of renal origin: Secondary | ICD-10-CM | POA: Diagnosis not present

## 2018-10-06 DIAGNOSIS — D631 Anemia in chronic kidney disease: Secondary | ICD-10-CM | POA: Diagnosis not present

## 2018-10-07 DIAGNOSIS — H16223 Keratoconjunctivitis sicca, not specified as Sjogren's, bilateral: Secondary | ICD-10-CM | POA: Diagnosis not present

## 2018-10-07 DIAGNOSIS — H16122 Filamentary keratitis, left eye: Secondary | ICD-10-CM | POA: Diagnosis not present

## 2018-10-07 DIAGNOSIS — H40052 Ocular hypertension, left eye: Secondary | ICD-10-CM | POA: Diagnosis not present

## 2018-10-08 DIAGNOSIS — N2581 Secondary hyperparathyroidism of renal origin: Secondary | ICD-10-CM | POA: Diagnosis not present

## 2018-10-08 DIAGNOSIS — D509 Iron deficiency anemia, unspecified: Secondary | ICD-10-CM | POA: Diagnosis not present

## 2018-10-08 DIAGNOSIS — D631 Anemia in chronic kidney disease: Secondary | ICD-10-CM | POA: Diagnosis not present

## 2018-10-08 DIAGNOSIS — I120 Hypertensive chronic kidney disease with stage 5 chronic kidney disease or end stage renal disease: Secondary | ICD-10-CM | POA: Diagnosis not present

## 2018-10-08 DIAGNOSIS — N186 End stage renal disease: Secondary | ICD-10-CM | POA: Diagnosis not present

## 2018-10-08 DIAGNOSIS — Z992 Dependence on renal dialysis: Secondary | ICD-10-CM | POA: Diagnosis not present

## 2018-10-08 DIAGNOSIS — E119 Type 2 diabetes mellitus without complications: Secondary | ICD-10-CM | POA: Diagnosis not present

## 2018-10-10 DIAGNOSIS — E119 Type 2 diabetes mellitus without complications: Secondary | ICD-10-CM | POA: Diagnosis not present

## 2018-10-10 DIAGNOSIS — D631 Anemia in chronic kidney disease: Secondary | ICD-10-CM | POA: Diagnosis not present

## 2018-10-10 DIAGNOSIS — D509 Iron deficiency anemia, unspecified: Secondary | ICD-10-CM | POA: Diagnosis not present

## 2018-10-10 DIAGNOSIS — N2581 Secondary hyperparathyroidism of renal origin: Secondary | ICD-10-CM | POA: Diagnosis not present

## 2018-10-10 DIAGNOSIS — N186 End stage renal disease: Secondary | ICD-10-CM | POA: Diagnosis not present

## 2018-10-13 DIAGNOSIS — D509 Iron deficiency anemia, unspecified: Secondary | ICD-10-CM | POA: Diagnosis not present

## 2018-10-13 DIAGNOSIS — N2581 Secondary hyperparathyroidism of renal origin: Secondary | ICD-10-CM | POA: Diagnosis not present

## 2018-10-13 DIAGNOSIS — N186 End stage renal disease: Secondary | ICD-10-CM | POA: Diagnosis not present

## 2018-10-13 DIAGNOSIS — D631 Anemia in chronic kidney disease: Secondary | ICD-10-CM | POA: Diagnosis not present

## 2018-10-13 DIAGNOSIS — E119 Type 2 diabetes mellitus without complications: Secondary | ICD-10-CM | POA: Diagnosis not present

## 2018-10-15 DIAGNOSIS — N186 End stage renal disease: Secondary | ICD-10-CM | POA: Diagnosis not present

## 2018-10-15 DIAGNOSIS — N2581 Secondary hyperparathyroidism of renal origin: Secondary | ICD-10-CM | POA: Diagnosis not present

## 2018-10-15 DIAGNOSIS — D631 Anemia in chronic kidney disease: Secondary | ICD-10-CM | POA: Diagnosis not present

## 2018-10-15 DIAGNOSIS — E119 Type 2 diabetes mellitus without complications: Secondary | ICD-10-CM | POA: Diagnosis not present

## 2018-10-15 DIAGNOSIS — D509 Iron deficiency anemia, unspecified: Secondary | ICD-10-CM | POA: Diagnosis not present

## 2018-10-17 DIAGNOSIS — D509 Iron deficiency anemia, unspecified: Secondary | ICD-10-CM | POA: Diagnosis not present

## 2018-10-17 DIAGNOSIS — N186 End stage renal disease: Secondary | ICD-10-CM | POA: Diagnosis not present

## 2018-10-17 DIAGNOSIS — E119 Type 2 diabetes mellitus without complications: Secondary | ICD-10-CM | POA: Diagnosis not present

## 2018-10-17 DIAGNOSIS — D631 Anemia in chronic kidney disease: Secondary | ICD-10-CM | POA: Diagnosis not present

## 2018-10-17 DIAGNOSIS — N2581 Secondary hyperparathyroidism of renal origin: Secondary | ICD-10-CM | POA: Diagnosis not present

## 2018-10-20 DIAGNOSIS — E119 Type 2 diabetes mellitus without complications: Secondary | ICD-10-CM | POA: Diagnosis not present

## 2018-10-20 DIAGNOSIS — D631 Anemia in chronic kidney disease: Secondary | ICD-10-CM | POA: Diagnosis not present

## 2018-10-20 DIAGNOSIS — D509 Iron deficiency anemia, unspecified: Secondary | ICD-10-CM | POA: Diagnosis not present

## 2018-10-20 DIAGNOSIS — N186 End stage renal disease: Secondary | ICD-10-CM | POA: Diagnosis not present

## 2018-10-20 DIAGNOSIS — N2581 Secondary hyperparathyroidism of renal origin: Secondary | ICD-10-CM | POA: Diagnosis not present

## 2018-10-24 DIAGNOSIS — N2581 Secondary hyperparathyroidism of renal origin: Secondary | ICD-10-CM | POA: Diagnosis not present

## 2018-10-24 DIAGNOSIS — N186 End stage renal disease: Secondary | ICD-10-CM | POA: Diagnosis not present

## 2018-10-24 DIAGNOSIS — D509 Iron deficiency anemia, unspecified: Secondary | ICD-10-CM | POA: Diagnosis not present

## 2018-10-24 DIAGNOSIS — E119 Type 2 diabetes mellitus without complications: Secondary | ICD-10-CM | POA: Diagnosis not present

## 2018-10-24 DIAGNOSIS — D631 Anemia in chronic kidney disease: Secondary | ICD-10-CM | POA: Diagnosis not present

## 2018-10-27 DIAGNOSIS — E119 Type 2 diabetes mellitus without complications: Secondary | ICD-10-CM | POA: Diagnosis not present

## 2018-10-27 DIAGNOSIS — N2581 Secondary hyperparathyroidism of renal origin: Secondary | ICD-10-CM | POA: Diagnosis not present

## 2018-10-27 DIAGNOSIS — D631 Anemia in chronic kidney disease: Secondary | ICD-10-CM | POA: Diagnosis not present

## 2018-10-27 DIAGNOSIS — N186 End stage renal disease: Secondary | ICD-10-CM | POA: Diagnosis not present

## 2018-10-27 DIAGNOSIS — D509 Iron deficiency anemia, unspecified: Secondary | ICD-10-CM | POA: Diagnosis not present

## 2018-10-29 DIAGNOSIS — D631 Anemia in chronic kidney disease: Secondary | ICD-10-CM | POA: Diagnosis not present

## 2018-10-29 DIAGNOSIS — E1129 Type 2 diabetes mellitus with other diabetic kidney complication: Secondary | ICD-10-CM | POA: Diagnosis not present

## 2018-10-29 DIAGNOSIS — N2581 Secondary hyperparathyroidism of renal origin: Secondary | ICD-10-CM | POA: Diagnosis not present

## 2018-10-29 DIAGNOSIS — N186 End stage renal disease: Secondary | ICD-10-CM | POA: Diagnosis not present

## 2018-10-29 DIAGNOSIS — D509 Iron deficiency anemia, unspecified: Secondary | ICD-10-CM | POA: Diagnosis not present

## 2018-10-29 DIAGNOSIS — E119 Type 2 diabetes mellitus without complications: Secondary | ICD-10-CM | POA: Diagnosis not present

## 2018-10-30 DIAGNOSIS — E1165 Type 2 diabetes mellitus with hyperglycemia: Secondary | ICD-10-CM | POA: Diagnosis not present

## 2018-10-30 DIAGNOSIS — I119 Hypertensive heart disease without heart failure: Secondary | ICD-10-CM | POA: Diagnosis not present

## 2018-10-30 DIAGNOSIS — E7800 Pure hypercholesterolemia, unspecified: Secondary | ICD-10-CM | POA: Diagnosis not present

## 2018-10-30 DIAGNOSIS — I6990 Unspecified sequelae of unspecified cerebrovascular disease: Secondary | ICD-10-CM | POA: Diagnosis not present

## 2018-10-31 DIAGNOSIS — E119 Type 2 diabetes mellitus without complications: Secondary | ICD-10-CM | POA: Diagnosis not present

## 2018-10-31 DIAGNOSIS — N2581 Secondary hyperparathyroidism of renal origin: Secondary | ICD-10-CM | POA: Diagnosis not present

## 2018-10-31 DIAGNOSIS — D631 Anemia in chronic kidney disease: Secondary | ICD-10-CM | POA: Diagnosis not present

## 2018-10-31 DIAGNOSIS — D509 Iron deficiency anemia, unspecified: Secondary | ICD-10-CM | POA: Diagnosis not present

## 2018-10-31 DIAGNOSIS — N186 End stage renal disease: Secondary | ICD-10-CM | POA: Diagnosis not present

## 2018-11-03 DIAGNOSIS — N186 End stage renal disease: Secondary | ICD-10-CM | POA: Diagnosis not present

## 2018-11-03 DIAGNOSIS — E119 Type 2 diabetes mellitus without complications: Secondary | ICD-10-CM | POA: Diagnosis not present

## 2018-11-03 DIAGNOSIS — D509 Iron deficiency anemia, unspecified: Secondary | ICD-10-CM | POA: Diagnosis not present

## 2018-11-03 DIAGNOSIS — D631 Anemia in chronic kidney disease: Secondary | ICD-10-CM | POA: Diagnosis not present

## 2018-11-03 DIAGNOSIS — N2581 Secondary hyperparathyroidism of renal origin: Secondary | ICD-10-CM | POA: Diagnosis not present

## 2018-11-05 DIAGNOSIS — D509 Iron deficiency anemia, unspecified: Secondary | ICD-10-CM | POA: Diagnosis not present

## 2018-11-05 DIAGNOSIS — D631 Anemia in chronic kidney disease: Secondary | ICD-10-CM | POA: Diagnosis not present

## 2018-11-05 DIAGNOSIS — N186 End stage renal disease: Secondary | ICD-10-CM | POA: Diagnosis not present

## 2018-11-05 DIAGNOSIS — N2581 Secondary hyperparathyroidism of renal origin: Secondary | ICD-10-CM | POA: Diagnosis not present

## 2018-11-05 DIAGNOSIS — E119 Type 2 diabetes mellitus without complications: Secondary | ICD-10-CM | POA: Diagnosis not present

## 2018-11-07 DIAGNOSIS — N2581 Secondary hyperparathyroidism of renal origin: Secondary | ICD-10-CM | POA: Diagnosis not present

## 2018-11-07 DIAGNOSIS — E119 Type 2 diabetes mellitus without complications: Secondary | ICD-10-CM | POA: Diagnosis not present

## 2018-11-07 DIAGNOSIS — D509 Iron deficiency anemia, unspecified: Secondary | ICD-10-CM | POA: Diagnosis not present

## 2018-11-07 DIAGNOSIS — D631 Anemia in chronic kidney disease: Secondary | ICD-10-CM | POA: Diagnosis not present

## 2018-11-07 DIAGNOSIS — I120 Hypertensive chronic kidney disease with stage 5 chronic kidney disease or end stage renal disease: Secondary | ICD-10-CM | POA: Diagnosis not present

## 2018-11-07 DIAGNOSIS — N186 End stage renal disease: Secondary | ICD-10-CM | POA: Diagnosis not present

## 2018-11-07 DIAGNOSIS — Z992 Dependence on renal dialysis: Secondary | ICD-10-CM | POA: Diagnosis not present

## 2018-11-10 DIAGNOSIS — N186 End stage renal disease: Secondary | ICD-10-CM | POA: Diagnosis not present

## 2018-11-10 DIAGNOSIS — N2581 Secondary hyperparathyroidism of renal origin: Secondary | ICD-10-CM | POA: Diagnosis not present

## 2018-11-10 DIAGNOSIS — D509 Iron deficiency anemia, unspecified: Secondary | ICD-10-CM | POA: Diagnosis not present

## 2018-11-10 DIAGNOSIS — E119 Type 2 diabetes mellitus without complications: Secondary | ICD-10-CM | POA: Diagnosis not present

## 2018-11-10 DIAGNOSIS — D631 Anemia in chronic kidney disease: Secondary | ICD-10-CM | POA: Diagnosis not present

## 2018-11-12 DIAGNOSIS — N186 End stage renal disease: Secondary | ICD-10-CM | POA: Diagnosis not present

## 2018-11-12 DIAGNOSIS — D631 Anemia in chronic kidney disease: Secondary | ICD-10-CM | POA: Diagnosis not present

## 2018-11-12 DIAGNOSIS — D509 Iron deficiency anemia, unspecified: Secondary | ICD-10-CM | POA: Diagnosis not present

## 2018-11-12 DIAGNOSIS — N2581 Secondary hyperparathyroidism of renal origin: Secondary | ICD-10-CM | POA: Diagnosis not present

## 2018-11-12 DIAGNOSIS — E119 Type 2 diabetes mellitus without complications: Secondary | ICD-10-CM | POA: Diagnosis not present

## 2018-11-14 DIAGNOSIS — E119 Type 2 diabetes mellitus without complications: Secondary | ICD-10-CM | POA: Diagnosis not present

## 2018-11-14 DIAGNOSIS — D631 Anemia in chronic kidney disease: Secondary | ICD-10-CM | POA: Diagnosis not present

## 2018-11-14 DIAGNOSIS — D509 Iron deficiency anemia, unspecified: Secondary | ICD-10-CM | POA: Diagnosis not present

## 2018-11-14 DIAGNOSIS — N2581 Secondary hyperparathyroidism of renal origin: Secondary | ICD-10-CM | POA: Diagnosis not present

## 2018-11-14 DIAGNOSIS — N186 End stage renal disease: Secondary | ICD-10-CM | POA: Diagnosis not present

## 2018-11-17 DIAGNOSIS — D631 Anemia in chronic kidney disease: Secondary | ICD-10-CM | POA: Diagnosis not present

## 2018-11-17 DIAGNOSIS — D509 Iron deficiency anemia, unspecified: Secondary | ICD-10-CM | POA: Diagnosis not present

## 2018-11-17 DIAGNOSIS — E119 Type 2 diabetes mellitus without complications: Secondary | ICD-10-CM | POA: Diagnosis not present

## 2018-11-17 DIAGNOSIS — N2581 Secondary hyperparathyroidism of renal origin: Secondary | ICD-10-CM | POA: Diagnosis not present

## 2018-11-17 DIAGNOSIS — N186 End stage renal disease: Secondary | ICD-10-CM | POA: Diagnosis not present

## 2018-11-19 DIAGNOSIS — D509 Iron deficiency anemia, unspecified: Secondary | ICD-10-CM | POA: Diagnosis not present

## 2018-11-19 DIAGNOSIS — N2581 Secondary hyperparathyroidism of renal origin: Secondary | ICD-10-CM | POA: Diagnosis not present

## 2018-11-19 DIAGNOSIS — E119 Type 2 diabetes mellitus without complications: Secondary | ICD-10-CM | POA: Diagnosis not present

## 2018-11-19 DIAGNOSIS — N186 End stage renal disease: Secondary | ICD-10-CM | POA: Diagnosis not present

## 2018-11-19 DIAGNOSIS — D631 Anemia in chronic kidney disease: Secondary | ICD-10-CM | POA: Diagnosis not present

## 2018-11-21 DIAGNOSIS — E119 Type 2 diabetes mellitus without complications: Secondary | ICD-10-CM | POA: Diagnosis not present

## 2018-11-21 DIAGNOSIS — N2581 Secondary hyperparathyroidism of renal origin: Secondary | ICD-10-CM | POA: Diagnosis not present

## 2018-11-21 DIAGNOSIS — D509 Iron deficiency anemia, unspecified: Secondary | ICD-10-CM | POA: Diagnosis not present

## 2018-11-21 DIAGNOSIS — D631 Anemia in chronic kidney disease: Secondary | ICD-10-CM | POA: Diagnosis not present

## 2018-11-21 DIAGNOSIS — N186 End stage renal disease: Secondary | ICD-10-CM | POA: Diagnosis not present

## 2018-11-24 DIAGNOSIS — D509 Iron deficiency anemia, unspecified: Secondary | ICD-10-CM | POA: Diagnosis not present

## 2018-11-24 DIAGNOSIS — N186 End stage renal disease: Secondary | ICD-10-CM | POA: Diagnosis not present

## 2018-11-24 DIAGNOSIS — D631 Anemia in chronic kidney disease: Secondary | ICD-10-CM | POA: Diagnosis not present

## 2018-11-24 DIAGNOSIS — E119 Type 2 diabetes mellitus without complications: Secondary | ICD-10-CM | POA: Diagnosis not present

## 2018-11-24 DIAGNOSIS — N2581 Secondary hyperparathyroidism of renal origin: Secondary | ICD-10-CM | POA: Diagnosis not present

## 2018-11-26 DIAGNOSIS — D631 Anemia in chronic kidney disease: Secondary | ICD-10-CM | POA: Diagnosis not present

## 2018-11-26 DIAGNOSIS — N2581 Secondary hyperparathyroidism of renal origin: Secondary | ICD-10-CM | POA: Diagnosis not present

## 2018-11-26 DIAGNOSIS — D509 Iron deficiency anemia, unspecified: Secondary | ICD-10-CM | POA: Diagnosis not present

## 2018-11-26 DIAGNOSIS — E119 Type 2 diabetes mellitus without complications: Secondary | ICD-10-CM | POA: Diagnosis not present

## 2018-11-26 DIAGNOSIS — N186 End stage renal disease: Secondary | ICD-10-CM | POA: Diagnosis not present

## 2018-11-28 DIAGNOSIS — E119 Type 2 diabetes mellitus without complications: Secondary | ICD-10-CM | POA: Diagnosis not present

## 2018-11-28 DIAGNOSIS — N2581 Secondary hyperparathyroidism of renal origin: Secondary | ICD-10-CM | POA: Diagnosis not present

## 2018-11-28 DIAGNOSIS — N186 End stage renal disease: Secondary | ICD-10-CM | POA: Diagnosis not present

## 2018-11-28 DIAGNOSIS — D631 Anemia in chronic kidney disease: Secondary | ICD-10-CM | POA: Diagnosis not present

## 2018-11-28 DIAGNOSIS — D509 Iron deficiency anemia, unspecified: Secondary | ICD-10-CM | POA: Diagnosis not present

## 2018-12-01 DIAGNOSIS — N186 End stage renal disease: Secondary | ICD-10-CM | POA: Diagnosis not present

## 2018-12-01 DIAGNOSIS — E119 Type 2 diabetes mellitus without complications: Secondary | ICD-10-CM | POA: Diagnosis not present

## 2018-12-01 DIAGNOSIS — N2581 Secondary hyperparathyroidism of renal origin: Secondary | ICD-10-CM | POA: Diagnosis not present

## 2018-12-01 DIAGNOSIS — D509 Iron deficiency anemia, unspecified: Secondary | ICD-10-CM | POA: Diagnosis not present

## 2018-12-01 DIAGNOSIS — D631 Anemia in chronic kidney disease: Secondary | ICD-10-CM | POA: Diagnosis not present

## 2018-12-03 DIAGNOSIS — N2581 Secondary hyperparathyroidism of renal origin: Secondary | ICD-10-CM | POA: Diagnosis not present

## 2018-12-03 DIAGNOSIS — D509 Iron deficiency anemia, unspecified: Secondary | ICD-10-CM | POA: Diagnosis not present

## 2018-12-03 DIAGNOSIS — D631 Anemia in chronic kidney disease: Secondary | ICD-10-CM | POA: Diagnosis not present

## 2018-12-03 DIAGNOSIS — E119 Type 2 diabetes mellitus without complications: Secondary | ICD-10-CM | POA: Diagnosis not present

## 2018-12-03 DIAGNOSIS — N186 End stage renal disease: Secondary | ICD-10-CM | POA: Diagnosis not present

## 2018-12-05 DIAGNOSIS — N2581 Secondary hyperparathyroidism of renal origin: Secondary | ICD-10-CM | POA: Diagnosis not present

## 2018-12-05 DIAGNOSIS — D631 Anemia in chronic kidney disease: Secondary | ICD-10-CM | POA: Diagnosis not present

## 2018-12-05 DIAGNOSIS — E119 Type 2 diabetes mellitus without complications: Secondary | ICD-10-CM | POA: Diagnosis not present

## 2018-12-05 DIAGNOSIS — D509 Iron deficiency anemia, unspecified: Secondary | ICD-10-CM | POA: Diagnosis not present

## 2018-12-05 DIAGNOSIS — N186 End stage renal disease: Secondary | ICD-10-CM | POA: Diagnosis not present

## 2018-12-08 DIAGNOSIS — N2581 Secondary hyperparathyroidism of renal origin: Secondary | ICD-10-CM | POA: Diagnosis not present

## 2018-12-08 DIAGNOSIS — E119 Type 2 diabetes mellitus without complications: Secondary | ICD-10-CM | POA: Diagnosis not present

## 2018-12-08 DIAGNOSIS — Z992 Dependence on renal dialysis: Secondary | ICD-10-CM | POA: Diagnosis not present

## 2018-12-08 DIAGNOSIS — N186 End stage renal disease: Secondary | ICD-10-CM | POA: Diagnosis not present

## 2018-12-08 DIAGNOSIS — I120 Hypertensive chronic kidney disease with stage 5 chronic kidney disease or end stage renal disease: Secondary | ICD-10-CM | POA: Diagnosis not present

## 2018-12-08 DIAGNOSIS — D631 Anemia in chronic kidney disease: Secondary | ICD-10-CM | POA: Diagnosis not present

## 2018-12-08 DIAGNOSIS — D509 Iron deficiency anemia, unspecified: Secondary | ICD-10-CM | POA: Diagnosis not present

## 2018-12-10 DIAGNOSIS — D509 Iron deficiency anemia, unspecified: Secondary | ICD-10-CM | POA: Diagnosis not present

## 2018-12-10 DIAGNOSIS — D631 Anemia in chronic kidney disease: Secondary | ICD-10-CM | POA: Diagnosis not present

## 2018-12-10 DIAGNOSIS — E119 Type 2 diabetes mellitus without complications: Secondary | ICD-10-CM | POA: Diagnosis not present

## 2018-12-10 DIAGNOSIS — N2581 Secondary hyperparathyroidism of renal origin: Secondary | ICD-10-CM | POA: Diagnosis not present

## 2018-12-10 DIAGNOSIS — N186 End stage renal disease: Secondary | ICD-10-CM | POA: Diagnosis not present

## 2018-12-12 DIAGNOSIS — D509 Iron deficiency anemia, unspecified: Secondary | ICD-10-CM | POA: Diagnosis not present

## 2018-12-12 DIAGNOSIS — N186 End stage renal disease: Secondary | ICD-10-CM | POA: Diagnosis not present

## 2018-12-12 DIAGNOSIS — D631 Anemia in chronic kidney disease: Secondary | ICD-10-CM | POA: Diagnosis not present

## 2018-12-12 DIAGNOSIS — N2581 Secondary hyperparathyroidism of renal origin: Secondary | ICD-10-CM | POA: Diagnosis not present

## 2018-12-12 DIAGNOSIS — E119 Type 2 diabetes mellitus without complications: Secondary | ICD-10-CM | POA: Diagnosis not present

## 2018-12-15 DIAGNOSIS — N186 End stage renal disease: Secondary | ICD-10-CM | POA: Diagnosis not present

## 2018-12-15 DIAGNOSIS — E119 Type 2 diabetes mellitus without complications: Secondary | ICD-10-CM | POA: Diagnosis not present

## 2018-12-15 DIAGNOSIS — N2581 Secondary hyperparathyroidism of renal origin: Secondary | ICD-10-CM | POA: Diagnosis not present

## 2018-12-15 DIAGNOSIS — D509 Iron deficiency anemia, unspecified: Secondary | ICD-10-CM | POA: Diagnosis not present

## 2018-12-15 DIAGNOSIS — D631 Anemia in chronic kidney disease: Secondary | ICD-10-CM | POA: Diagnosis not present

## 2018-12-17 DIAGNOSIS — N2581 Secondary hyperparathyroidism of renal origin: Secondary | ICD-10-CM | POA: Diagnosis not present

## 2018-12-17 DIAGNOSIS — E119 Type 2 diabetes mellitus without complications: Secondary | ICD-10-CM | POA: Diagnosis not present

## 2018-12-17 DIAGNOSIS — D631 Anemia in chronic kidney disease: Secondary | ICD-10-CM | POA: Diagnosis not present

## 2018-12-17 DIAGNOSIS — N186 End stage renal disease: Secondary | ICD-10-CM | POA: Diagnosis not present

## 2018-12-17 DIAGNOSIS — D509 Iron deficiency anemia, unspecified: Secondary | ICD-10-CM | POA: Diagnosis not present

## 2018-12-19 DIAGNOSIS — N2581 Secondary hyperparathyroidism of renal origin: Secondary | ICD-10-CM | POA: Diagnosis not present

## 2018-12-19 DIAGNOSIS — D631 Anemia in chronic kidney disease: Secondary | ICD-10-CM | POA: Diagnosis not present

## 2018-12-19 DIAGNOSIS — E119 Type 2 diabetes mellitus without complications: Secondary | ICD-10-CM | POA: Diagnosis not present

## 2018-12-19 DIAGNOSIS — N186 End stage renal disease: Secondary | ICD-10-CM | POA: Diagnosis not present

## 2018-12-19 DIAGNOSIS — D509 Iron deficiency anemia, unspecified: Secondary | ICD-10-CM | POA: Diagnosis not present

## 2018-12-22 DIAGNOSIS — N2581 Secondary hyperparathyroidism of renal origin: Secondary | ICD-10-CM | POA: Diagnosis not present

## 2018-12-22 DIAGNOSIS — D509 Iron deficiency anemia, unspecified: Secondary | ICD-10-CM | POA: Diagnosis not present

## 2018-12-22 DIAGNOSIS — N186 End stage renal disease: Secondary | ICD-10-CM | POA: Diagnosis not present

## 2018-12-22 DIAGNOSIS — D631 Anemia in chronic kidney disease: Secondary | ICD-10-CM | POA: Diagnosis not present

## 2018-12-22 DIAGNOSIS — E119 Type 2 diabetes mellitus without complications: Secondary | ICD-10-CM | POA: Diagnosis not present

## 2018-12-23 ENCOUNTER — Ambulatory Visit (INDEPENDENT_AMBULATORY_CARE_PROVIDER_SITE_OTHER): Payer: Medicare Other | Admitting: Podiatry

## 2018-12-23 ENCOUNTER — Other Ambulatory Visit: Payer: Self-pay

## 2018-12-23 ENCOUNTER — Encounter: Payer: Self-pay | Admitting: Podiatry

## 2018-12-23 DIAGNOSIS — M79675 Pain in left toe(s): Secondary | ICD-10-CM

## 2018-12-23 DIAGNOSIS — M79674 Pain in right toe(s): Secondary | ICD-10-CM | POA: Diagnosis not present

## 2018-12-23 DIAGNOSIS — E118 Type 2 diabetes mellitus with unspecified complications: Secondary | ICD-10-CM

## 2018-12-23 DIAGNOSIS — B351 Tinea unguium: Secondary | ICD-10-CM

## 2018-12-23 NOTE — Progress Notes (Signed)
Patient ID: Shawn [name], male   DOB: [birth year], 58 y.o.   MRN: [id number] Complaint:  Visit Type: Patient returns to my office for continued preventative foot care services. Complaint: Patient states" my nails have grown long and thick and become painful to walk and wear shoes" Patient has been diagnosed with DM with neuropathy and angiopathy.. The patient presents for preventative foot care services. No changes to ROS  Podiatric Exam: Vascular: dorsalis pedis and posterior tibial pulses are not  palpable bilateral. Capillary return is immediate. Cold feet  B/L Skin turgor WNL  Sensorium: Diminished Semmes Weinstein monofilament test. Normal tactile sensation bilaterally. Nail Exam: Pt has thick disfigured discolored nails with subungual debris noted bilateral entire nail hallux through fifth toenails Ulcer Exam: There is no evidence of ulcer or pre-ulcerative changes or infection. Orthopedic Exam: Muscle tone and strength are WNL. No limitations in general ROM. No crepitus or effusions noted. Foot type and digits show no abnormalities. HAV B/L  Hammer toe 2  B/L Skin: No Porokeratosis. No infection or ulcers.    No signs of redness or infection or swelling.  Diagnosis:  Onychomycosis, , Pain in right toe, pain in left toes  Treatment & Plan Procedures and Treatment: Consent by patient was obtained for treatment procedures. The patient understood the discussion of treatment and procedures well. All questions were answered thoroughly reviewed. Debridement of mycotic and hypertrophic toenails, 1 through 5 bilateral and clearing of subungual debris. No ulceration, no infection noted.    Return Visit-Office Procedure: Patient instructed to return to the office for a follow up visit 4 months for continued evaluation and treatment.  Gardiner Barefoot DPM

## 2018-12-24 DIAGNOSIS — E119 Type 2 diabetes mellitus without complications: Secondary | ICD-10-CM | POA: Diagnosis not present

## 2018-12-24 DIAGNOSIS — D509 Iron deficiency anemia, unspecified: Secondary | ICD-10-CM | POA: Diagnosis not present

## 2018-12-24 DIAGNOSIS — D631 Anemia in chronic kidney disease: Secondary | ICD-10-CM | POA: Diagnosis not present

## 2018-12-24 DIAGNOSIS — N2581 Secondary hyperparathyroidism of renal origin: Secondary | ICD-10-CM | POA: Diagnosis not present

## 2018-12-24 DIAGNOSIS — N186 End stage renal disease: Secondary | ICD-10-CM | POA: Diagnosis not present

## 2018-12-26 DIAGNOSIS — D509 Iron deficiency anemia, unspecified: Secondary | ICD-10-CM | POA: Diagnosis not present

## 2018-12-26 DIAGNOSIS — N2581 Secondary hyperparathyroidism of renal origin: Secondary | ICD-10-CM | POA: Diagnosis not present

## 2018-12-26 DIAGNOSIS — D631 Anemia in chronic kidney disease: Secondary | ICD-10-CM | POA: Diagnosis not present

## 2018-12-26 DIAGNOSIS — N186 End stage renal disease: Secondary | ICD-10-CM | POA: Diagnosis not present

## 2018-12-26 DIAGNOSIS — E119 Type 2 diabetes mellitus without complications: Secondary | ICD-10-CM | POA: Diagnosis not present

## 2018-12-29 DIAGNOSIS — N186 End stage renal disease: Secondary | ICD-10-CM | POA: Diagnosis not present

## 2018-12-29 DIAGNOSIS — N2581 Secondary hyperparathyroidism of renal origin: Secondary | ICD-10-CM | POA: Diagnosis not present

## 2018-12-29 DIAGNOSIS — D631 Anemia in chronic kidney disease: Secondary | ICD-10-CM | POA: Diagnosis not present

## 2018-12-29 DIAGNOSIS — D509 Iron deficiency anemia, unspecified: Secondary | ICD-10-CM | POA: Diagnosis not present

## 2018-12-29 DIAGNOSIS — E119 Type 2 diabetes mellitus without complications: Secondary | ICD-10-CM | POA: Diagnosis not present

## 2018-12-31 DIAGNOSIS — D631 Anemia in chronic kidney disease: Secondary | ICD-10-CM | POA: Diagnosis not present

## 2018-12-31 DIAGNOSIS — E119 Type 2 diabetes mellitus without complications: Secondary | ICD-10-CM | POA: Diagnosis not present

## 2018-12-31 DIAGNOSIS — N2581 Secondary hyperparathyroidism of renal origin: Secondary | ICD-10-CM | POA: Diagnosis not present

## 2018-12-31 DIAGNOSIS — D509 Iron deficiency anemia, unspecified: Secondary | ICD-10-CM | POA: Diagnosis not present

## 2018-12-31 DIAGNOSIS — N186 End stage renal disease: Secondary | ICD-10-CM | POA: Diagnosis not present

## 2019-01-02 DIAGNOSIS — D631 Anemia in chronic kidney disease: Secondary | ICD-10-CM | POA: Diagnosis not present

## 2019-01-02 DIAGNOSIS — N186 End stage renal disease: Secondary | ICD-10-CM | POA: Diagnosis not present

## 2019-01-02 DIAGNOSIS — N2581 Secondary hyperparathyroidism of renal origin: Secondary | ICD-10-CM | POA: Diagnosis not present

## 2019-01-02 DIAGNOSIS — D509 Iron deficiency anemia, unspecified: Secondary | ICD-10-CM | POA: Diagnosis not present

## 2019-01-02 DIAGNOSIS — E119 Type 2 diabetes mellitus without complications: Secondary | ICD-10-CM | POA: Diagnosis not present

## 2019-01-05 DIAGNOSIS — D509 Iron deficiency anemia, unspecified: Secondary | ICD-10-CM | POA: Diagnosis not present

## 2019-01-05 DIAGNOSIS — N2581 Secondary hyperparathyroidism of renal origin: Secondary | ICD-10-CM | POA: Diagnosis not present

## 2019-01-05 DIAGNOSIS — D631 Anemia in chronic kidney disease: Secondary | ICD-10-CM | POA: Diagnosis not present

## 2019-01-05 DIAGNOSIS — E119 Type 2 diabetes mellitus without complications: Secondary | ICD-10-CM | POA: Diagnosis not present

## 2019-01-05 DIAGNOSIS — N186 End stage renal disease: Secondary | ICD-10-CM | POA: Diagnosis not present

## 2019-01-07 DIAGNOSIS — N2581 Secondary hyperparathyroidism of renal origin: Secondary | ICD-10-CM | POA: Diagnosis not present

## 2019-01-07 DIAGNOSIS — I120 Hypertensive chronic kidney disease with stage 5 chronic kidney disease or end stage renal disease: Secondary | ICD-10-CM | POA: Diagnosis not present

## 2019-01-07 DIAGNOSIS — N186 End stage renal disease: Secondary | ICD-10-CM | POA: Diagnosis not present

## 2019-01-07 DIAGNOSIS — E119 Type 2 diabetes mellitus without complications: Secondary | ICD-10-CM | POA: Diagnosis not present

## 2019-01-07 DIAGNOSIS — D631 Anemia in chronic kidney disease: Secondary | ICD-10-CM | POA: Diagnosis not present

## 2019-01-07 DIAGNOSIS — Z992 Dependence on renal dialysis: Secondary | ICD-10-CM | POA: Diagnosis not present

## 2019-01-07 DIAGNOSIS — D509 Iron deficiency anemia, unspecified: Secondary | ICD-10-CM | POA: Diagnosis not present

## 2019-01-09 DIAGNOSIS — D509 Iron deficiency anemia, unspecified: Secondary | ICD-10-CM | POA: Diagnosis not present

## 2019-01-09 DIAGNOSIS — N2581 Secondary hyperparathyroidism of renal origin: Secondary | ICD-10-CM | POA: Diagnosis not present

## 2019-01-09 DIAGNOSIS — N186 End stage renal disease: Secondary | ICD-10-CM | POA: Diagnosis not present

## 2019-01-09 DIAGNOSIS — E119 Type 2 diabetes mellitus without complications: Secondary | ICD-10-CM | POA: Diagnosis not present

## 2019-01-09 DIAGNOSIS — D631 Anemia in chronic kidney disease: Secondary | ICD-10-CM | POA: Diagnosis not present

## 2019-01-12 DIAGNOSIS — N186 End stage renal disease: Secondary | ICD-10-CM | POA: Diagnosis not present

## 2019-01-12 DIAGNOSIS — E119 Type 2 diabetes mellitus without complications: Secondary | ICD-10-CM | POA: Diagnosis not present

## 2019-01-12 DIAGNOSIS — D509 Iron deficiency anemia, unspecified: Secondary | ICD-10-CM | POA: Diagnosis not present

## 2019-01-12 DIAGNOSIS — N2581 Secondary hyperparathyroidism of renal origin: Secondary | ICD-10-CM | POA: Diagnosis not present

## 2019-01-12 DIAGNOSIS — D631 Anemia in chronic kidney disease: Secondary | ICD-10-CM | POA: Diagnosis not present

## 2019-01-14 DIAGNOSIS — E119 Type 2 diabetes mellitus without complications: Secondary | ICD-10-CM | POA: Diagnosis not present

## 2019-01-14 DIAGNOSIS — N186 End stage renal disease: Secondary | ICD-10-CM | POA: Diagnosis not present

## 2019-01-14 DIAGNOSIS — D631 Anemia in chronic kidney disease: Secondary | ICD-10-CM | POA: Diagnosis not present

## 2019-01-14 DIAGNOSIS — N2581 Secondary hyperparathyroidism of renal origin: Secondary | ICD-10-CM | POA: Diagnosis not present

## 2019-01-14 DIAGNOSIS — D509 Iron deficiency anemia, unspecified: Secondary | ICD-10-CM | POA: Diagnosis not present

## 2019-01-16 DIAGNOSIS — E119 Type 2 diabetes mellitus without complications: Secondary | ICD-10-CM | POA: Diagnosis not present

## 2019-01-16 DIAGNOSIS — N186 End stage renal disease: Secondary | ICD-10-CM | POA: Diagnosis not present

## 2019-01-16 DIAGNOSIS — N2581 Secondary hyperparathyroidism of renal origin: Secondary | ICD-10-CM | POA: Diagnosis not present

## 2019-01-16 DIAGNOSIS — D509 Iron deficiency anemia, unspecified: Secondary | ICD-10-CM | POA: Diagnosis not present

## 2019-01-16 DIAGNOSIS — D631 Anemia in chronic kidney disease: Secondary | ICD-10-CM | POA: Diagnosis not present

## 2019-01-19 DIAGNOSIS — D509 Iron deficiency anemia, unspecified: Secondary | ICD-10-CM | POA: Diagnosis not present

## 2019-01-19 DIAGNOSIS — N2581 Secondary hyperparathyroidism of renal origin: Secondary | ICD-10-CM | POA: Diagnosis not present

## 2019-01-19 DIAGNOSIS — D631 Anemia in chronic kidney disease: Secondary | ICD-10-CM | POA: Diagnosis not present

## 2019-01-19 DIAGNOSIS — E119 Type 2 diabetes mellitus without complications: Secondary | ICD-10-CM | POA: Diagnosis not present

## 2019-01-19 DIAGNOSIS — N186 End stage renal disease: Secondary | ICD-10-CM | POA: Diagnosis not present

## 2019-01-21 DIAGNOSIS — E119 Type 2 diabetes mellitus without complications: Secondary | ICD-10-CM | POA: Diagnosis not present

## 2019-01-21 DIAGNOSIS — N2581 Secondary hyperparathyroidism of renal origin: Secondary | ICD-10-CM | POA: Diagnosis not present

## 2019-01-21 DIAGNOSIS — D509 Iron deficiency anemia, unspecified: Secondary | ICD-10-CM | POA: Diagnosis not present

## 2019-01-21 DIAGNOSIS — D631 Anemia in chronic kidney disease: Secondary | ICD-10-CM | POA: Diagnosis not present

## 2019-01-21 DIAGNOSIS — N186 End stage renal disease: Secondary | ICD-10-CM | POA: Diagnosis not present

## 2019-01-23 DIAGNOSIS — N2581 Secondary hyperparathyroidism of renal origin: Secondary | ICD-10-CM | POA: Diagnosis not present

## 2019-01-23 DIAGNOSIS — D509 Iron deficiency anemia, unspecified: Secondary | ICD-10-CM | POA: Diagnosis not present

## 2019-01-23 DIAGNOSIS — N186 End stage renal disease: Secondary | ICD-10-CM | POA: Diagnosis not present

## 2019-01-23 DIAGNOSIS — D631 Anemia in chronic kidney disease: Secondary | ICD-10-CM | POA: Diagnosis not present

## 2019-01-23 DIAGNOSIS — E119 Type 2 diabetes mellitus without complications: Secondary | ICD-10-CM | POA: Diagnosis not present

## 2019-01-26 DIAGNOSIS — N186 End stage renal disease: Secondary | ICD-10-CM | POA: Diagnosis not present

## 2019-01-26 DIAGNOSIS — N2581 Secondary hyperparathyroidism of renal origin: Secondary | ICD-10-CM | POA: Diagnosis not present

## 2019-01-26 DIAGNOSIS — E119 Type 2 diabetes mellitus without complications: Secondary | ICD-10-CM | POA: Diagnosis not present

## 2019-01-26 DIAGNOSIS — D509 Iron deficiency anemia, unspecified: Secondary | ICD-10-CM | POA: Diagnosis not present

## 2019-01-26 DIAGNOSIS — D631 Anemia in chronic kidney disease: Secondary | ICD-10-CM | POA: Diagnosis not present

## 2019-01-28 DIAGNOSIS — E1129 Type 2 diabetes mellitus with other diabetic kidney complication: Secondary | ICD-10-CM | POA: Diagnosis not present

## 2019-01-28 DIAGNOSIS — N2581 Secondary hyperparathyroidism of renal origin: Secondary | ICD-10-CM | POA: Diagnosis not present

## 2019-01-28 DIAGNOSIS — D509 Iron deficiency anemia, unspecified: Secondary | ICD-10-CM | POA: Diagnosis not present

## 2019-01-28 DIAGNOSIS — D631 Anemia in chronic kidney disease: Secondary | ICD-10-CM | POA: Diagnosis not present

## 2019-01-28 DIAGNOSIS — E119 Type 2 diabetes mellitus without complications: Secondary | ICD-10-CM | POA: Diagnosis not present

## 2019-01-28 DIAGNOSIS — N186 End stage renal disease: Secondary | ICD-10-CM | POA: Diagnosis not present

## 2019-01-30 DIAGNOSIS — N186 End stage renal disease: Secondary | ICD-10-CM | POA: Diagnosis not present

## 2019-01-30 DIAGNOSIS — D631 Anemia in chronic kidney disease: Secondary | ICD-10-CM | POA: Diagnosis not present

## 2019-01-30 DIAGNOSIS — E119 Type 2 diabetes mellitus without complications: Secondary | ICD-10-CM | POA: Diagnosis not present

## 2019-01-30 DIAGNOSIS — N2581 Secondary hyperparathyroidism of renal origin: Secondary | ICD-10-CM | POA: Diagnosis not present

## 2019-01-30 DIAGNOSIS — D509 Iron deficiency anemia, unspecified: Secondary | ICD-10-CM | POA: Diagnosis not present

## 2019-02-02 DIAGNOSIS — D631 Anemia in chronic kidney disease: Secondary | ICD-10-CM | POA: Diagnosis not present

## 2019-02-02 DIAGNOSIS — D509 Iron deficiency anemia, unspecified: Secondary | ICD-10-CM | POA: Diagnosis not present

## 2019-02-02 DIAGNOSIS — N2581 Secondary hyperparathyroidism of renal origin: Secondary | ICD-10-CM | POA: Diagnosis not present

## 2019-02-02 DIAGNOSIS — N186 End stage renal disease: Secondary | ICD-10-CM | POA: Diagnosis not present

## 2019-02-02 DIAGNOSIS — E119 Type 2 diabetes mellitus without complications: Secondary | ICD-10-CM | POA: Diagnosis not present

## 2019-02-03 DIAGNOSIS — Z139 Encounter for screening, unspecified: Secondary | ICD-10-CM | POA: Diagnosis not present

## 2019-02-03 DIAGNOSIS — I119 Hypertensive heart disease without heart failure: Secondary | ICD-10-CM | POA: Diagnosis not present

## 2019-02-03 DIAGNOSIS — Z79899 Other long term (current) drug therapy: Secondary | ICD-10-CM | POA: Diagnosis not present

## 2019-02-03 DIAGNOSIS — E1165 Type 2 diabetes mellitus with hyperglycemia: Secondary | ICD-10-CM | POA: Diagnosis not present

## 2019-02-03 DIAGNOSIS — E559 Vitamin D deficiency, unspecified: Secondary | ICD-10-CM | POA: Diagnosis not present

## 2019-02-03 DIAGNOSIS — I6990 Unspecified sequelae of unspecified cerebrovascular disease: Secondary | ICD-10-CM | POA: Diagnosis not present

## 2019-02-03 DIAGNOSIS — Z125 Encounter for screening for malignant neoplasm of prostate: Secondary | ICD-10-CM | POA: Diagnosis not present

## 2019-02-03 DIAGNOSIS — E7800 Pure hypercholesterolemia, unspecified: Secondary | ICD-10-CM | POA: Diagnosis not present

## 2019-02-04 DIAGNOSIS — D631 Anemia in chronic kidney disease: Secondary | ICD-10-CM | POA: Diagnosis not present

## 2019-02-04 DIAGNOSIS — D509 Iron deficiency anemia, unspecified: Secondary | ICD-10-CM | POA: Diagnosis not present

## 2019-02-04 DIAGNOSIS — N2581 Secondary hyperparathyroidism of renal origin: Secondary | ICD-10-CM | POA: Diagnosis not present

## 2019-02-04 DIAGNOSIS — N186 End stage renal disease: Secondary | ICD-10-CM | POA: Diagnosis not present

## 2019-02-04 DIAGNOSIS — E119 Type 2 diabetes mellitus without complications: Secondary | ICD-10-CM | POA: Diagnosis not present

## 2019-02-06 DIAGNOSIS — N2581 Secondary hyperparathyroidism of renal origin: Secondary | ICD-10-CM | POA: Diagnosis not present

## 2019-02-06 DIAGNOSIS — N186 End stage renal disease: Secondary | ICD-10-CM | POA: Diagnosis not present

## 2019-02-06 DIAGNOSIS — E119 Type 2 diabetes mellitus without complications: Secondary | ICD-10-CM | POA: Diagnosis not present

## 2019-02-06 DIAGNOSIS — D631 Anemia in chronic kidney disease: Secondary | ICD-10-CM | POA: Diagnosis not present

## 2019-02-06 DIAGNOSIS — D509 Iron deficiency anemia, unspecified: Secondary | ICD-10-CM | POA: Diagnosis not present

## 2019-02-07 DIAGNOSIS — N186 End stage renal disease: Secondary | ICD-10-CM | POA: Diagnosis not present

## 2019-02-07 DIAGNOSIS — I120 Hypertensive chronic kidney disease with stage 5 chronic kidney disease or end stage renal disease: Secondary | ICD-10-CM | POA: Diagnosis not present

## 2019-02-07 DIAGNOSIS — Z992 Dependence on renal dialysis: Secondary | ICD-10-CM | POA: Diagnosis not present

## 2019-02-09 DIAGNOSIS — N186 End stage renal disease: Secondary | ICD-10-CM | POA: Diagnosis not present

## 2019-02-09 DIAGNOSIS — E119 Type 2 diabetes mellitus without complications: Secondary | ICD-10-CM | POA: Diagnosis not present

## 2019-02-09 DIAGNOSIS — D631 Anemia in chronic kidney disease: Secondary | ICD-10-CM | POA: Diagnosis not present

## 2019-02-09 DIAGNOSIS — Z992 Dependence on renal dialysis: Secondary | ICD-10-CM | POA: Diagnosis not present

## 2019-02-09 DIAGNOSIS — D509 Iron deficiency anemia, unspecified: Secondary | ICD-10-CM | POA: Diagnosis not present

## 2019-02-09 DIAGNOSIS — N2581 Secondary hyperparathyroidism of renal origin: Secondary | ICD-10-CM | POA: Diagnosis not present

## 2019-02-11 DIAGNOSIS — Z992 Dependence on renal dialysis: Secondary | ICD-10-CM | POA: Diagnosis not present

## 2019-02-11 DIAGNOSIS — D631 Anemia in chronic kidney disease: Secondary | ICD-10-CM | POA: Diagnosis not present

## 2019-02-11 DIAGNOSIS — N186 End stage renal disease: Secondary | ICD-10-CM | POA: Diagnosis not present

## 2019-02-11 DIAGNOSIS — N2581 Secondary hyperparathyroidism of renal origin: Secondary | ICD-10-CM | POA: Diagnosis not present

## 2019-02-11 DIAGNOSIS — D509 Iron deficiency anemia, unspecified: Secondary | ICD-10-CM | POA: Diagnosis not present

## 2019-02-11 DIAGNOSIS — E119 Type 2 diabetes mellitus without complications: Secondary | ICD-10-CM | POA: Diagnosis not present

## 2019-02-13 DIAGNOSIS — E119 Type 2 diabetes mellitus without complications: Secondary | ICD-10-CM | POA: Diagnosis not present

## 2019-02-13 DIAGNOSIS — D509 Iron deficiency anemia, unspecified: Secondary | ICD-10-CM | POA: Diagnosis not present

## 2019-02-13 DIAGNOSIS — Z992 Dependence on renal dialysis: Secondary | ICD-10-CM | POA: Diagnosis not present

## 2019-02-13 DIAGNOSIS — N186 End stage renal disease: Secondary | ICD-10-CM | POA: Diagnosis not present

## 2019-02-13 DIAGNOSIS — N2581 Secondary hyperparathyroidism of renal origin: Secondary | ICD-10-CM | POA: Diagnosis not present

## 2019-02-13 DIAGNOSIS — D631 Anemia in chronic kidney disease: Secondary | ICD-10-CM | POA: Diagnosis not present

## 2019-02-16 DIAGNOSIS — D509 Iron deficiency anemia, unspecified: Secondary | ICD-10-CM | POA: Diagnosis not present

## 2019-02-16 DIAGNOSIS — Z992 Dependence on renal dialysis: Secondary | ICD-10-CM | POA: Diagnosis not present

## 2019-02-16 DIAGNOSIS — N186 End stage renal disease: Secondary | ICD-10-CM | POA: Diagnosis not present

## 2019-02-16 DIAGNOSIS — E119 Type 2 diabetes mellitus without complications: Secondary | ICD-10-CM | POA: Diagnosis not present

## 2019-02-16 DIAGNOSIS — D631 Anemia in chronic kidney disease: Secondary | ICD-10-CM | POA: Diagnosis not present

## 2019-02-16 DIAGNOSIS — N2581 Secondary hyperparathyroidism of renal origin: Secondary | ICD-10-CM | POA: Diagnosis not present

## 2019-02-18 DIAGNOSIS — Z992 Dependence on renal dialysis: Secondary | ICD-10-CM | POA: Diagnosis not present

## 2019-02-18 DIAGNOSIS — D509 Iron deficiency anemia, unspecified: Secondary | ICD-10-CM | POA: Diagnosis not present

## 2019-02-18 DIAGNOSIS — N2581 Secondary hyperparathyroidism of renal origin: Secondary | ICD-10-CM | POA: Diagnosis not present

## 2019-02-18 DIAGNOSIS — N186 End stage renal disease: Secondary | ICD-10-CM | POA: Diagnosis not present

## 2019-02-18 DIAGNOSIS — D631 Anemia in chronic kidney disease: Secondary | ICD-10-CM | POA: Diagnosis not present

## 2019-02-18 DIAGNOSIS — E119 Type 2 diabetes mellitus without complications: Secondary | ICD-10-CM | POA: Diagnosis not present

## 2019-02-20 DIAGNOSIS — N186 End stage renal disease: Secondary | ICD-10-CM | POA: Diagnosis not present

## 2019-02-20 DIAGNOSIS — N2581 Secondary hyperparathyroidism of renal origin: Secondary | ICD-10-CM | POA: Diagnosis not present

## 2019-02-20 DIAGNOSIS — Z992 Dependence on renal dialysis: Secondary | ICD-10-CM | POA: Diagnosis not present

## 2019-02-20 DIAGNOSIS — D631 Anemia in chronic kidney disease: Secondary | ICD-10-CM | POA: Diagnosis not present

## 2019-02-20 DIAGNOSIS — E119 Type 2 diabetes mellitus without complications: Secondary | ICD-10-CM | POA: Diagnosis not present

## 2019-02-20 DIAGNOSIS — D509 Iron deficiency anemia, unspecified: Secondary | ICD-10-CM | POA: Diagnosis not present

## 2019-02-23 DIAGNOSIS — Z992 Dependence on renal dialysis: Secondary | ICD-10-CM | POA: Diagnosis not present

## 2019-02-23 DIAGNOSIS — E119 Type 2 diabetes mellitus without complications: Secondary | ICD-10-CM | POA: Diagnosis not present

## 2019-02-23 DIAGNOSIS — D509 Iron deficiency anemia, unspecified: Secondary | ICD-10-CM | POA: Diagnosis not present

## 2019-02-23 DIAGNOSIS — D631 Anemia in chronic kidney disease: Secondary | ICD-10-CM | POA: Diagnosis not present

## 2019-02-23 DIAGNOSIS — N2581 Secondary hyperparathyroidism of renal origin: Secondary | ICD-10-CM | POA: Diagnosis not present

## 2019-02-23 DIAGNOSIS — N186 End stage renal disease: Secondary | ICD-10-CM | POA: Diagnosis not present

## 2019-02-25 DIAGNOSIS — E119 Type 2 diabetes mellitus without complications: Secondary | ICD-10-CM | POA: Diagnosis not present

## 2019-02-25 DIAGNOSIS — D631 Anemia in chronic kidney disease: Secondary | ICD-10-CM | POA: Diagnosis not present

## 2019-02-25 DIAGNOSIS — D509 Iron deficiency anemia, unspecified: Secondary | ICD-10-CM | POA: Diagnosis not present

## 2019-02-25 DIAGNOSIS — Z992 Dependence on renal dialysis: Secondary | ICD-10-CM | POA: Diagnosis not present

## 2019-02-25 DIAGNOSIS — N2581 Secondary hyperparathyroidism of renal origin: Secondary | ICD-10-CM | POA: Diagnosis not present

## 2019-02-25 DIAGNOSIS — N186 End stage renal disease: Secondary | ICD-10-CM | POA: Diagnosis not present

## 2019-02-27 DIAGNOSIS — D631 Anemia in chronic kidney disease: Secondary | ICD-10-CM | POA: Diagnosis not present

## 2019-02-27 DIAGNOSIS — D509 Iron deficiency anemia, unspecified: Secondary | ICD-10-CM | POA: Diagnosis not present

## 2019-02-27 DIAGNOSIS — E119 Type 2 diabetes mellitus without complications: Secondary | ICD-10-CM | POA: Diagnosis not present

## 2019-02-27 DIAGNOSIS — N2581 Secondary hyperparathyroidism of renal origin: Secondary | ICD-10-CM | POA: Diagnosis not present

## 2019-02-27 DIAGNOSIS — N186 End stage renal disease: Secondary | ICD-10-CM | POA: Diagnosis not present

## 2019-02-27 DIAGNOSIS — Z992 Dependence on renal dialysis: Secondary | ICD-10-CM | POA: Diagnosis not present

## 2019-03-02 DIAGNOSIS — N186 End stage renal disease: Secondary | ICD-10-CM | POA: Diagnosis not present

## 2019-03-02 DIAGNOSIS — D631 Anemia in chronic kidney disease: Secondary | ICD-10-CM | POA: Diagnosis not present

## 2019-03-02 DIAGNOSIS — N2581 Secondary hyperparathyroidism of renal origin: Secondary | ICD-10-CM | POA: Diagnosis not present

## 2019-03-02 DIAGNOSIS — Z992 Dependence on renal dialysis: Secondary | ICD-10-CM | POA: Diagnosis not present

## 2019-03-02 DIAGNOSIS — D509 Iron deficiency anemia, unspecified: Secondary | ICD-10-CM | POA: Diagnosis not present

## 2019-03-02 DIAGNOSIS — E119 Type 2 diabetes mellitus without complications: Secondary | ICD-10-CM | POA: Diagnosis not present

## 2019-03-03 DIAGNOSIS — H4720 Unspecified optic atrophy: Secondary | ICD-10-CM | POA: Diagnosis not present

## 2019-03-03 DIAGNOSIS — E113553 Type 2 diabetes mellitus with stable proliferative diabetic retinopathy, bilateral: Secondary | ICD-10-CM | POA: Diagnosis not present

## 2019-03-03 DIAGNOSIS — H401122 Primary open-angle glaucoma, left eye, moderate stage: Secondary | ICD-10-CM | POA: Diagnosis not present

## 2019-03-03 DIAGNOSIS — H35352 Cystoid macular degeneration, left eye: Secondary | ICD-10-CM | POA: Diagnosis not present

## 2019-03-04 DIAGNOSIS — N2581 Secondary hyperparathyroidism of renal origin: Secondary | ICD-10-CM | POA: Diagnosis not present

## 2019-03-04 DIAGNOSIS — N186 End stage renal disease: Secondary | ICD-10-CM | POA: Diagnosis not present

## 2019-03-04 DIAGNOSIS — E119 Type 2 diabetes mellitus without complications: Secondary | ICD-10-CM | POA: Diagnosis not present

## 2019-03-04 DIAGNOSIS — D509 Iron deficiency anemia, unspecified: Secondary | ICD-10-CM | POA: Diagnosis not present

## 2019-03-04 DIAGNOSIS — D631 Anemia in chronic kidney disease: Secondary | ICD-10-CM | POA: Diagnosis not present

## 2019-03-04 DIAGNOSIS — Z992 Dependence on renal dialysis: Secondary | ICD-10-CM | POA: Diagnosis not present

## 2019-03-06 DIAGNOSIS — Z992 Dependence on renal dialysis: Secondary | ICD-10-CM | POA: Diagnosis not present

## 2019-03-06 DIAGNOSIS — E119 Type 2 diabetes mellitus without complications: Secondary | ICD-10-CM | POA: Diagnosis not present

## 2019-03-06 DIAGNOSIS — D631 Anemia in chronic kidney disease: Secondary | ICD-10-CM | POA: Diagnosis not present

## 2019-03-06 DIAGNOSIS — N186 End stage renal disease: Secondary | ICD-10-CM | POA: Diagnosis not present

## 2019-03-06 DIAGNOSIS — N2581 Secondary hyperparathyroidism of renal origin: Secondary | ICD-10-CM | POA: Diagnosis not present

## 2019-03-06 DIAGNOSIS — D509 Iron deficiency anemia, unspecified: Secondary | ICD-10-CM | POA: Diagnosis not present

## 2019-03-09 DIAGNOSIS — Z992 Dependence on renal dialysis: Secondary | ICD-10-CM | POA: Diagnosis not present

## 2019-03-09 DIAGNOSIS — N2581 Secondary hyperparathyroidism of renal origin: Secondary | ICD-10-CM | POA: Diagnosis not present

## 2019-03-09 DIAGNOSIS — D509 Iron deficiency anemia, unspecified: Secondary | ICD-10-CM | POA: Diagnosis not present

## 2019-03-09 DIAGNOSIS — N186 End stage renal disease: Secondary | ICD-10-CM | POA: Diagnosis not present

## 2019-03-09 DIAGNOSIS — E119 Type 2 diabetes mellitus without complications: Secondary | ICD-10-CM | POA: Diagnosis not present

## 2019-03-09 DIAGNOSIS — D631 Anemia in chronic kidney disease: Secondary | ICD-10-CM | POA: Diagnosis not present

## 2019-03-10 DIAGNOSIS — I120 Hypertensive chronic kidney disease with stage 5 chronic kidney disease or end stage renal disease: Secondary | ICD-10-CM | POA: Diagnosis not present

## 2019-03-10 DIAGNOSIS — N186 End stage renal disease: Secondary | ICD-10-CM | POA: Diagnosis not present

## 2019-03-10 DIAGNOSIS — Z992 Dependence on renal dialysis: Secondary | ICD-10-CM | POA: Diagnosis not present

## 2019-03-11 DIAGNOSIS — Z23 Encounter for immunization: Secondary | ICD-10-CM | POA: Diagnosis not present

## 2019-03-11 DIAGNOSIS — N2581 Secondary hyperparathyroidism of renal origin: Secondary | ICD-10-CM | POA: Diagnosis not present

## 2019-03-11 DIAGNOSIS — D631 Anemia in chronic kidney disease: Secondary | ICD-10-CM | POA: Diagnosis not present

## 2019-03-11 DIAGNOSIS — E119 Type 2 diabetes mellitus without complications: Secondary | ICD-10-CM | POA: Diagnosis not present

## 2019-03-11 DIAGNOSIS — D509 Iron deficiency anemia, unspecified: Secondary | ICD-10-CM | POA: Diagnosis not present

## 2019-03-11 DIAGNOSIS — Z992 Dependence on renal dialysis: Secondary | ICD-10-CM | POA: Diagnosis not present

## 2019-03-11 DIAGNOSIS — N186 End stage renal disease: Secondary | ICD-10-CM | POA: Diagnosis not present

## 2019-03-13 DIAGNOSIS — Z992 Dependence on renal dialysis: Secondary | ICD-10-CM | POA: Diagnosis not present

## 2019-03-13 DIAGNOSIS — D509 Iron deficiency anemia, unspecified: Secondary | ICD-10-CM | POA: Diagnosis not present

## 2019-03-13 DIAGNOSIS — E119 Type 2 diabetes mellitus without complications: Secondary | ICD-10-CM | POA: Diagnosis not present

## 2019-03-13 DIAGNOSIS — D631 Anemia in chronic kidney disease: Secondary | ICD-10-CM | POA: Diagnosis not present

## 2019-03-13 DIAGNOSIS — N2581 Secondary hyperparathyroidism of renal origin: Secondary | ICD-10-CM | POA: Diagnosis not present

## 2019-03-13 DIAGNOSIS — N186 End stage renal disease: Secondary | ICD-10-CM | POA: Diagnosis not present

## 2019-03-16 DIAGNOSIS — N2581 Secondary hyperparathyroidism of renal origin: Secondary | ICD-10-CM | POA: Diagnosis not present

## 2019-03-16 DIAGNOSIS — N186 End stage renal disease: Secondary | ICD-10-CM | POA: Diagnosis not present

## 2019-03-16 DIAGNOSIS — Z992 Dependence on renal dialysis: Secondary | ICD-10-CM | POA: Diagnosis not present

## 2019-03-16 DIAGNOSIS — E119 Type 2 diabetes mellitus without complications: Secondary | ICD-10-CM | POA: Diagnosis not present

## 2019-03-16 DIAGNOSIS — D631 Anemia in chronic kidney disease: Secondary | ICD-10-CM | POA: Diagnosis not present

## 2019-03-16 DIAGNOSIS — D509 Iron deficiency anemia, unspecified: Secondary | ICD-10-CM | POA: Diagnosis not present

## 2019-03-18 DIAGNOSIS — N186 End stage renal disease: Secondary | ICD-10-CM | POA: Diagnosis not present

## 2019-03-18 DIAGNOSIS — E119 Type 2 diabetes mellitus without complications: Secondary | ICD-10-CM | POA: Diagnosis not present

## 2019-03-18 DIAGNOSIS — Z992 Dependence on renal dialysis: Secondary | ICD-10-CM | POA: Diagnosis not present

## 2019-03-18 DIAGNOSIS — N2581 Secondary hyperparathyroidism of renal origin: Secondary | ICD-10-CM | POA: Diagnosis not present

## 2019-03-18 DIAGNOSIS — D509 Iron deficiency anemia, unspecified: Secondary | ICD-10-CM | POA: Diagnosis not present

## 2019-03-18 DIAGNOSIS — D631 Anemia in chronic kidney disease: Secondary | ICD-10-CM | POA: Diagnosis not present

## 2019-03-20 DIAGNOSIS — Z992 Dependence on renal dialysis: Secondary | ICD-10-CM | POA: Diagnosis not present

## 2019-03-20 DIAGNOSIS — N2581 Secondary hyperparathyroidism of renal origin: Secondary | ICD-10-CM | POA: Diagnosis not present

## 2019-03-20 DIAGNOSIS — D631 Anemia in chronic kidney disease: Secondary | ICD-10-CM | POA: Diagnosis not present

## 2019-03-20 DIAGNOSIS — D509 Iron deficiency anemia, unspecified: Secondary | ICD-10-CM | POA: Diagnosis not present

## 2019-03-20 DIAGNOSIS — E119 Type 2 diabetes mellitus without complications: Secondary | ICD-10-CM | POA: Diagnosis not present

## 2019-03-20 DIAGNOSIS — N186 End stage renal disease: Secondary | ICD-10-CM | POA: Diagnosis not present

## 2019-03-23 DIAGNOSIS — D509 Iron deficiency anemia, unspecified: Secondary | ICD-10-CM | POA: Diagnosis not present

## 2019-03-23 DIAGNOSIS — E119 Type 2 diabetes mellitus without complications: Secondary | ICD-10-CM | POA: Diagnosis not present

## 2019-03-23 DIAGNOSIS — N2581 Secondary hyperparathyroidism of renal origin: Secondary | ICD-10-CM | POA: Diagnosis not present

## 2019-03-23 DIAGNOSIS — D631 Anemia in chronic kidney disease: Secondary | ICD-10-CM | POA: Diagnosis not present

## 2019-03-23 DIAGNOSIS — N186 End stage renal disease: Secondary | ICD-10-CM | POA: Diagnosis not present

## 2019-03-23 DIAGNOSIS — Z992 Dependence on renal dialysis: Secondary | ICD-10-CM | POA: Diagnosis not present

## 2019-03-24 DIAGNOSIS — E113553 Type 2 diabetes mellitus with stable proliferative diabetic retinopathy, bilateral: Secondary | ICD-10-CM | POA: Diagnosis not present

## 2019-03-24 DIAGNOSIS — H401133 Primary open-angle glaucoma, bilateral, severe stage: Secondary | ICD-10-CM | POA: Diagnosis not present

## 2019-03-24 DIAGNOSIS — H16122 Filamentary keratitis, left eye: Secondary | ICD-10-CM | POA: Diagnosis not present

## 2019-03-24 DIAGNOSIS — H31093 Other chorioretinal scars, bilateral: Secondary | ICD-10-CM | POA: Diagnosis not present

## 2019-03-24 DIAGNOSIS — H33051 Total retinal detachment, right eye: Secondary | ICD-10-CM | POA: Diagnosis not present

## 2019-03-24 DIAGNOSIS — H1851 Endothelial corneal dystrophy: Secondary | ICD-10-CM | POA: Diagnosis not present

## 2019-03-24 DIAGNOSIS — H16223 Keratoconjunctivitis sicca, not specified as Sjogren's, bilateral: Secondary | ICD-10-CM | POA: Diagnosis not present

## 2019-03-25 DIAGNOSIS — D631 Anemia in chronic kidney disease: Secondary | ICD-10-CM | POA: Diagnosis not present

## 2019-03-25 DIAGNOSIS — E119 Type 2 diabetes mellitus without complications: Secondary | ICD-10-CM | POA: Diagnosis not present

## 2019-03-25 DIAGNOSIS — Z992 Dependence on renal dialysis: Secondary | ICD-10-CM | POA: Diagnosis not present

## 2019-03-25 DIAGNOSIS — N186 End stage renal disease: Secondary | ICD-10-CM | POA: Diagnosis not present

## 2019-03-25 DIAGNOSIS — N2581 Secondary hyperparathyroidism of renal origin: Secondary | ICD-10-CM | POA: Diagnosis not present

## 2019-03-25 DIAGNOSIS — D509 Iron deficiency anemia, unspecified: Secondary | ICD-10-CM | POA: Diagnosis not present

## 2019-03-27 DIAGNOSIS — D631 Anemia in chronic kidney disease: Secondary | ICD-10-CM | POA: Diagnosis not present

## 2019-03-27 DIAGNOSIS — D509 Iron deficiency anemia, unspecified: Secondary | ICD-10-CM | POA: Diagnosis not present

## 2019-03-27 DIAGNOSIS — E119 Type 2 diabetes mellitus without complications: Secondary | ICD-10-CM | POA: Diagnosis not present

## 2019-03-27 DIAGNOSIS — N186 End stage renal disease: Secondary | ICD-10-CM | POA: Diagnosis not present

## 2019-03-27 DIAGNOSIS — N2581 Secondary hyperparathyroidism of renal origin: Secondary | ICD-10-CM | POA: Diagnosis not present

## 2019-03-27 DIAGNOSIS — Z992 Dependence on renal dialysis: Secondary | ICD-10-CM | POA: Diagnosis not present

## 2019-03-30 DIAGNOSIS — D509 Iron deficiency anemia, unspecified: Secondary | ICD-10-CM | POA: Diagnosis not present

## 2019-03-30 DIAGNOSIS — N186 End stage renal disease: Secondary | ICD-10-CM | POA: Diagnosis not present

## 2019-03-30 DIAGNOSIS — D631 Anemia in chronic kidney disease: Secondary | ICD-10-CM | POA: Diagnosis not present

## 2019-03-30 DIAGNOSIS — Z992 Dependence on renal dialysis: Secondary | ICD-10-CM | POA: Diagnosis not present

## 2019-03-30 DIAGNOSIS — E119 Type 2 diabetes mellitus without complications: Secondary | ICD-10-CM | POA: Diagnosis not present

## 2019-03-30 DIAGNOSIS — N2581 Secondary hyperparathyroidism of renal origin: Secondary | ICD-10-CM | POA: Diagnosis not present

## 2019-04-01 DIAGNOSIS — N186 End stage renal disease: Secondary | ICD-10-CM | POA: Diagnosis not present

## 2019-04-01 DIAGNOSIS — Z992 Dependence on renal dialysis: Secondary | ICD-10-CM | POA: Diagnosis not present

## 2019-04-01 DIAGNOSIS — D509 Iron deficiency anemia, unspecified: Secondary | ICD-10-CM | POA: Diagnosis not present

## 2019-04-01 DIAGNOSIS — E119 Type 2 diabetes mellitus without complications: Secondary | ICD-10-CM | POA: Diagnosis not present

## 2019-04-01 DIAGNOSIS — D631 Anemia in chronic kidney disease: Secondary | ICD-10-CM | POA: Diagnosis not present

## 2019-04-01 DIAGNOSIS — N2581 Secondary hyperparathyroidism of renal origin: Secondary | ICD-10-CM | POA: Diagnosis not present

## 2019-04-03 DIAGNOSIS — Z992 Dependence on renal dialysis: Secondary | ICD-10-CM | POA: Diagnosis not present

## 2019-04-03 DIAGNOSIS — D631 Anemia in chronic kidney disease: Secondary | ICD-10-CM | POA: Diagnosis not present

## 2019-04-03 DIAGNOSIS — N186 End stage renal disease: Secondary | ICD-10-CM | POA: Diagnosis not present

## 2019-04-03 DIAGNOSIS — E119 Type 2 diabetes mellitus without complications: Secondary | ICD-10-CM | POA: Diagnosis not present

## 2019-04-03 DIAGNOSIS — N2581 Secondary hyperparathyroidism of renal origin: Secondary | ICD-10-CM | POA: Diagnosis not present

## 2019-04-03 DIAGNOSIS — D509 Iron deficiency anemia, unspecified: Secondary | ICD-10-CM | POA: Diagnosis not present

## 2019-04-06 DIAGNOSIS — N186 End stage renal disease: Secondary | ICD-10-CM | POA: Diagnosis not present

## 2019-04-06 DIAGNOSIS — E119 Type 2 diabetes mellitus without complications: Secondary | ICD-10-CM | POA: Diagnosis not present

## 2019-04-06 DIAGNOSIS — Z992 Dependence on renal dialysis: Secondary | ICD-10-CM | POA: Diagnosis not present

## 2019-04-06 DIAGNOSIS — D631 Anemia in chronic kidney disease: Secondary | ICD-10-CM | POA: Diagnosis not present

## 2019-04-06 DIAGNOSIS — D509 Iron deficiency anemia, unspecified: Secondary | ICD-10-CM | POA: Diagnosis not present

## 2019-04-06 DIAGNOSIS — N2581 Secondary hyperparathyroidism of renal origin: Secondary | ICD-10-CM | POA: Diagnosis not present

## 2019-04-08 DIAGNOSIS — E119 Type 2 diabetes mellitus without complications: Secondary | ICD-10-CM | POA: Diagnosis not present

## 2019-04-08 DIAGNOSIS — Z992 Dependence on renal dialysis: Secondary | ICD-10-CM | POA: Diagnosis not present

## 2019-04-08 DIAGNOSIS — N2581 Secondary hyperparathyroidism of renal origin: Secondary | ICD-10-CM | POA: Diagnosis not present

## 2019-04-08 DIAGNOSIS — D631 Anemia in chronic kidney disease: Secondary | ICD-10-CM | POA: Diagnosis not present

## 2019-04-08 DIAGNOSIS — N186 End stage renal disease: Secondary | ICD-10-CM | POA: Diagnosis not present

## 2019-04-08 DIAGNOSIS — D509 Iron deficiency anemia, unspecified: Secondary | ICD-10-CM | POA: Diagnosis not present

## 2019-04-09 DIAGNOSIS — E119 Type 2 diabetes mellitus without complications: Secondary | ICD-10-CM | POA: Diagnosis not present

## 2019-04-09 DIAGNOSIS — N2581 Secondary hyperparathyroidism of renal origin: Secondary | ICD-10-CM | POA: Diagnosis not present

## 2019-04-09 DIAGNOSIS — I120 Hypertensive chronic kidney disease with stage 5 chronic kidney disease or end stage renal disease: Secondary | ICD-10-CM | POA: Diagnosis not present

## 2019-04-09 DIAGNOSIS — E876 Hypokalemia: Secondary | ICD-10-CM | POA: Diagnosis not present

## 2019-04-09 DIAGNOSIS — D631 Anemia in chronic kidney disease: Secondary | ICD-10-CM | POA: Diagnosis not present

## 2019-04-09 DIAGNOSIS — D509 Iron deficiency anemia, unspecified: Secondary | ICD-10-CM | POA: Diagnosis not present

## 2019-04-09 DIAGNOSIS — Z992 Dependence on renal dialysis: Secondary | ICD-10-CM | POA: Diagnosis not present

## 2019-04-09 DIAGNOSIS — N186 End stage renal disease: Secondary | ICD-10-CM | POA: Diagnosis not present

## 2019-04-13 DIAGNOSIS — N2581 Secondary hyperparathyroidism of renal origin: Secondary | ICD-10-CM | POA: Diagnosis not present

## 2019-04-13 DIAGNOSIS — E119 Type 2 diabetes mellitus without complications: Secondary | ICD-10-CM | POA: Diagnosis not present

## 2019-04-13 DIAGNOSIS — E876 Hypokalemia: Secondary | ICD-10-CM | POA: Diagnosis not present

## 2019-04-13 DIAGNOSIS — D509 Iron deficiency anemia, unspecified: Secondary | ICD-10-CM | POA: Diagnosis not present

## 2019-04-13 DIAGNOSIS — Z992 Dependence on renal dialysis: Secondary | ICD-10-CM | POA: Diagnosis not present

## 2019-04-13 DIAGNOSIS — N186 End stage renal disease: Secondary | ICD-10-CM | POA: Diagnosis not present

## 2019-04-15 DIAGNOSIS — E119 Type 2 diabetes mellitus without complications: Secondary | ICD-10-CM | POA: Diagnosis not present

## 2019-04-15 DIAGNOSIS — D509 Iron deficiency anemia, unspecified: Secondary | ICD-10-CM | POA: Diagnosis not present

## 2019-04-15 DIAGNOSIS — N186 End stage renal disease: Secondary | ICD-10-CM | POA: Diagnosis not present

## 2019-04-15 DIAGNOSIS — E876 Hypokalemia: Secondary | ICD-10-CM | POA: Diagnosis not present

## 2019-04-15 DIAGNOSIS — N2581 Secondary hyperparathyroidism of renal origin: Secondary | ICD-10-CM | POA: Diagnosis not present

## 2019-04-15 DIAGNOSIS — Z992 Dependence on renal dialysis: Secondary | ICD-10-CM | POA: Diagnosis not present

## 2019-04-17 DIAGNOSIS — E876 Hypokalemia: Secondary | ICD-10-CM | POA: Insufficient documentation

## 2019-04-17 DIAGNOSIS — Z992 Dependence on renal dialysis: Secondary | ICD-10-CM | POA: Diagnosis not present

## 2019-04-17 DIAGNOSIS — N2581 Secondary hyperparathyroidism of renal origin: Secondary | ICD-10-CM | POA: Diagnosis not present

## 2019-04-17 DIAGNOSIS — N186 End stage renal disease: Secondary | ICD-10-CM | POA: Diagnosis not present

## 2019-04-17 DIAGNOSIS — E119 Type 2 diabetes mellitus without complications: Secondary | ICD-10-CM | POA: Diagnosis not present

## 2019-04-17 DIAGNOSIS — D509 Iron deficiency anemia, unspecified: Secondary | ICD-10-CM | POA: Diagnosis not present

## 2019-04-20 DIAGNOSIS — D509 Iron deficiency anemia, unspecified: Secondary | ICD-10-CM | POA: Diagnosis not present

## 2019-04-20 DIAGNOSIS — N2581 Secondary hyperparathyroidism of renal origin: Secondary | ICD-10-CM | POA: Diagnosis not present

## 2019-04-20 DIAGNOSIS — Z992 Dependence on renal dialysis: Secondary | ICD-10-CM | POA: Diagnosis not present

## 2019-04-20 DIAGNOSIS — E119 Type 2 diabetes mellitus without complications: Secondary | ICD-10-CM | POA: Diagnosis not present

## 2019-04-20 DIAGNOSIS — E876 Hypokalemia: Secondary | ICD-10-CM | POA: Diagnosis not present

## 2019-04-20 DIAGNOSIS — N186 End stage renal disease: Secondary | ICD-10-CM | POA: Diagnosis not present

## 2019-04-22 DIAGNOSIS — E876 Hypokalemia: Secondary | ICD-10-CM | POA: Diagnosis not present

## 2019-04-22 DIAGNOSIS — N186 End stage renal disease: Secondary | ICD-10-CM | POA: Diagnosis not present

## 2019-04-22 DIAGNOSIS — Z992 Dependence on renal dialysis: Secondary | ICD-10-CM | POA: Diagnosis not present

## 2019-04-22 DIAGNOSIS — D509 Iron deficiency anemia, unspecified: Secondary | ICD-10-CM | POA: Diagnosis not present

## 2019-04-22 DIAGNOSIS — E119 Type 2 diabetes mellitus without complications: Secondary | ICD-10-CM | POA: Diagnosis not present

## 2019-04-22 DIAGNOSIS — N2581 Secondary hyperparathyroidism of renal origin: Secondary | ICD-10-CM | POA: Diagnosis not present

## 2019-04-24 DIAGNOSIS — N2581 Secondary hyperparathyroidism of renal origin: Secondary | ICD-10-CM | POA: Diagnosis not present

## 2019-04-24 DIAGNOSIS — N186 End stage renal disease: Secondary | ICD-10-CM | POA: Diagnosis not present

## 2019-04-24 DIAGNOSIS — E876 Hypokalemia: Secondary | ICD-10-CM | POA: Diagnosis not present

## 2019-04-24 DIAGNOSIS — Z992 Dependence on renal dialysis: Secondary | ICD-10-CM | POA: Diagnosis not present

## 2019-04-24 DIAGNOSIS — E119 Type 2 diabetes mellitus without complications: Secondary | ICD-10-CM | POA: Diagnosis not present

## 2019-04-24 DIAGNOSIS — D509 Iron deficiency anemia, unspecified: Secondary | ICD-10-CM | POA: Diagnosis not present

## 2019-04-27 DIAGNOSIS — N186 End stage renal disease: Secondary | ICD-10-CM | POA: Diagnosis not present

## 2019-04-27 DIAGNOSIS — N2581 Secondary hyperparathyroidism of renal origin: Secondary | ICD-10-CM | POA: Diagnosis not present

## 2019-04-27 DIAGNOSIS — D509 Iron deficiency anemia, unspecified: Secondary | ICD-10-CM | POA: Diagnosis not present

## 2019-04-27 DIAGNOSIS — Z992 Dependence on renal dialysis: Secondary | ICD-10-CM | POA: Diagnosis not present

## 2019-04-27 DIAGNOSIS — E119 Type 2 diabetes mellitus without complications: Secondary | ICD-10-CM | POA: Diagnosis not present

## 2019-04-27 DIAGNOSIS — E876 Hypokalemia: Secondary | ICD-10-CM | POA: Diagnosis not present

## 2019-04-28 ENCOUNTER — Encounter: Payer: Self-pay | Admitting: Podiatry

## 2019-04-28 ENCOUNTER — Other Ambulatory Visit: Payer: Self-pay

## 2019-04-28 ENCOUNTER — Ambulatory Visit (INDEPENDENT_AMBULATORY_CARE_PROVIDER_SITE_OTHER): Payer: Medicare Other | Admitting: Podiatry

## 2019-04-28 DIAGNOSIS — M79675 Pain in left toe(s): Secondary | ICD-10-CM

## 2019-04-28 DIAGNOSIS — M79674 Pain in right toe(s): Secondary | ICD-10-CM

## 2019-04-28 DIAGNOSIS — B351 Tinea unguium: Secondary | ICD-10-CM | POA: Diagnosis not present

## 2019-04-28 DIAGNOSIS — E118 Type 2 diabetes mellitus with unspecified complications: Secondary | ICD-10-CM

## 2019-04-28 DIAGNOSIS — H548 Legal blindness, as defined in USA: Secondary | ICD-10-CM

## 2019-04-28 NOTE — Progress Notes (Signed)
Patient ID: Shawn [name], male   DOB: [birth year], 58 y.o.   MRN: [id number] Complaint:  Visit Type: Patient returns to my office for continued preventative foot care services. Complaint: Patient states" my nails have grown long and thick and become painful to walk and wear shoes" Patient has been diagnosed with DM with neuropathy and angiopathy.. The patient presents for preventative foot care services. No changes to ROS  Podiatric Exam: Vascular: dorsalis pedis and posterior tibial pulses are not  palpable bilateral. Capillary return is immediate. Cold feet  B/L Skin turgor WNL  Sensorium: Diminished Semmes Weinstein monofilament test. Normal tactile sensation bilaterally. Nail Exam: Pt has thick disfigured discolored nails with subungual debris noted bilateral entire nail hallux through fifth toenails Ulcer Exam: There is no evidence of ulcer or pre-ulcerative changes or infection. Orthopedic Exam: Muscle tone and strength are WNL. No limitations in general ROM. No crepitus or effusions noted. Foot type and digits show no abnormalities. HAV B/L  Hammer toe 2  B/L Skin: No Porokeratosis. No infection or ulcers.    No signs of redness or infection or swelling.  Diagnosis:  Onychomycosis, , Pain in right toe, pain in left toes  Treatment & Plan Procedures and Treatment: Consent by patient was obtained for treatment procedures. The patient understood the discussion of treatment and procedures well. All questions were answered thoroughly reviewed. Debridement of mycotic and hypertrophic toenails, 1 through 5 bilateral and clearing of subungual debris. No ulceration, no infection noted.    Return Visit-Office Procedure: Patient instructed to return to the office for a follow up visit 4 months for continued evaluation and treatment.  Gardiner Barefoot DPM

## 2019-04-29 DIAGNOSIS — E119 Type 2 diabetes mellitus without complications: Secondary | ICD-10-CM | POA: Diagnosis not present

## 2019-04-29 DIAGNOSIS — D509 Iron deficiency anemia, unspecified: Secondary | ICD-10-CM | POA: Diagnosis not present

## 2019-04-29 DIAGNOSIS — N186 End stage renal disease: Secondary | ICD-10-CM | POA: Diagnosis not present

## 2019-04-29 DIAGNOSIS — N2581 Secondary hyperparathyroidism of renal origin: Secondary | ICD-10-CM | POA: Diagnosis not present

## 2019-04-29 DIAGNOSIS — E876 Hypokalemia: Secondary | ICD-10-CM | POA: Diagnosis not present

## 2019-04-29 DIAGNOSIS — Z992 Dependence on renal dialysis: Secondary | ICD-10-CM | POA: Diagnosis not present

## 2019-05-01 DIAGNOSIS — N186 End stage renal disease: Secondary | ICD-10-CM | POA: Diagnosis not present

## 2019-05-01 DIAGNOSIS — E119 Type 2 diabetes mellitus without complications: Secondary | ICD-10-CM | POA: Diagnosis not present

## 2019-05-01 DIAGNOSIS — E876 Hypokalemia: Secondary | ICD-10-CM | POA: Diagnosis not present

## 2019-05-01 DIAGNOSIS — D509 Iron deficiency anemia, unspecified: Secondary | ICD-10-CM | POA: Diagnosis not present

## 2019-05-01 DIAGNOSIS — Z992 Dependence on renal dialysis: Secondary | ICD-10-CM | POA: Diagnosis not present

## 2019-05-01 DIAGNOSIS — N2581 Secondary hyperparathyroidism of renal origin: Secondary | ICD-10-CM | POA: Diagnosis not present

## 2019-05-04 DIAGNOSIS — E119 Type 2 diabetes mellitus without complications: Secondary | ICD-10-CM | POA: Diagnosis not present

## 2019-05-04 DIAGNOSIS — Z992 Dependence on renal dialysis: Secondary | ICD-10-CM | POA: Diagnosis not present

## 2019-05-04 DIAGNOSIS — N186 End stage renal disease: Secondary | ICD-10-CM | POA: Diagnosis not present

## 2019-05-04 DIAGNOSIS — E876 Hypokalemia: Secondary | ICD-10-CM | POA: Diagnosis not present

## 2019-05-04 DIAGNOSIS — D509 Iron deficiency anemia, unspecified: Secondary | ICD-10-CM | POA: Diagnosis not present

## 2019-05-04 DIAGNOSIS — N2581 Secondary hyperparathyroidism of renal origin: Secondary | ICD-10-CM | POA: Diagnosis not present

## 2019-05-05 ENCOUNTER — Telehealth (INDEPENDENT_AMBULATORY_CARE_PROVIDER_SITE_OTHER): Payer: Medicare Other | Admitting: Cardiology

## 2019-05-05 ENCOUNTER — Encounter: Payer: Self-pay | Admitting: Cardiology

## 2019-05-05 DIAGNOSIS — E118 Type 2 diabetes mellitus with unspecified complications: Secondary | ICD-10-CM | POA: Diagnosis not present

## 2019-05-05 DIAGNOSIS — E7800 Pure hypercholesterolemia, unspecified: Secondary | ICD-10-CM | POA: Insufficient documentation

## 2019-05-05 DIAGNOSIS — Z992 Dependence on renal dialysis: Secondary | ICD-10-CM | POA: Diagnosis not present

## 2019-05-05 DIAGNOSIS — Z8673 Personal history of transient ischemic attack (TIA), and cerebral infarction without residual deficits: Secondary | ICD-10-CM | POA: Diagnosis not present

## 2019-05-05 DIAGNOSIS — R002 Palpitations: Secondary | ICD-10-CM | POA: Diagnosis not present

## 2019-05-05 DIAGNOSIS — I10 Essential (primary) hypertension: Secondary | ICD-10-CM

## 2019-05-05 DIAGNOSIS — Z7189 Other specified counseling: Secondary | ICD-10-CM | POA: Diagnosis not present

## 2019-05-05 DIAGNOSIS — N186 End stage renal disease: Secondary | ICD-10-CM

## 2019-05-05 NOTE — Progress Notes (Signed)
Virtual Visit via Telephone Note   This visit type was conducted due to national recommendations for restrictions regarding the COVID-19 Pandemic (e.g. social distancing) in an effort to limit this patient's exposure and mitigate transmission in our community.  Due to his co-morbid illnesses, this patient is at least at moderate risk for complications without adequate follow up.  This format is felt to be most appropriate for this patient at this time.  The patient did not have access to video technology/had technical difficulties with video requiring transitioning to audio format only (telephone).  All issues noted in this document were discussed and addressed.  No physical exam could be performed with this format.  Please refer to the patient's chart for his  consent to telehealth for [facility].   Date:  05/05/2019   ID:  Shawn [name], DOB 1961-04-27, MRN [id number]  Patient Location: Home Provider Location: Home  PCP:  Shawn Liberty, MD  Cardiologist:  Shawn Dresser, MD  Electrophysiologist:  None   Evaluation Performed:  Follow-Up Visit  Chief Complaint:  Annual follow up  History of Present Illness:    Shawn [name] is a 58 y.o. male with a hx of end stage renal disease on dialysis, CVA, hypertension, diabetes, legal blindness.  The patient does not have symptoms concerning for COVID-19 infection (fever, chills, cough, or new shortness of breath).   He is doing well. No vitals today. Reports that BP at dialysis has been running low 150s/140s over 70/80. This is before dialysis, improves after dialysis. No chest pain or dizziness with dialysis.  Gets shortwinded with climbing stairs, walking a long distance, notices this more when he wears a mask. Better if he takes a rest/slides his mask down for a few minutes.  Denies chest pain, shortness of breath at rest or with normal exertion. No PND, orthopnea, LE edema or unexpected weight gain. No  syncope. Continues to have occasional palpitations, rare, no clear triggers or alleviating factors.  Biggest concern today is that he has a lot of difficulty sleeping. Has struggled with this for some time. Working with his PCP, tried OTC diphenhydramine without much success.   Past Medical History:  Diagnosis Date  . Anemia   . Arthritis    patient denies  . Blind right eye   . Chronic kidney disease    Dialysis since 2011 MWF  . Complication of anesthesia   . Diabetes mellitus    type II  . Hypertension   . Legally blind   . No pertinent past medical history   . PONV (postoperative nausea and vomiting)    N/V- became dehydrated had to have IV fluids- 01/2015  . Shortness of breath dyspnea    when has too fluid  . Stroke [facility]) 2012   date per patient  . Syncope   . Unspecified cerebral artery occlusion with cerebral infarction 04/02/2013   Past Surgical History:  Procedure Laterality Date  . AV FISTULA PLACEMENT Right 05/12/2013   Procedure: INSERTION OF ARTERIOVENOUS (AV) GORE-TEX GRAFT ARM; ULTRASOUND GUIDED;  Surgeon: Conrad Russell, MD;  Location: [place];  Service: Vascular;  Laterality: Right;  . AV FISTULA PLACEMENT Left 01/18/2015   Procedure: INSERTION OF ARTERIOVENOUS GORE-TEX GRAFT LEFT UPPER ARM;  Surgeon: Conrad Paxton, MD;  Location: [place];  Service: Vascular;  Laterality: Left;  . COLONOSCOPY  06/2012  . EYE SURGERY Bilateral    retina surgery both eyes, cataract surgery both eyes  . HERNIA REPAIR  right inguinal  . INSERTION OF DIALYSIS CATHETER Right   . left arm graft  10/2010  . LIGATION ARTERIOVENOUS GORTEX GRAFT Left 05/03/2015   Procedure: LIGATION ARTERIOVENOUS GORTEX GRAFT-LEFT UPPER ARM;  Surgeon: Conrad Lafayette, MD;  Location: [place];  Service: Vascular;  Laterality: Left;     Current Meds  Medication Sig  . amLODipine (NORVASC) 10 MG tablet Take 10 mg by mouth at bedtime.  Marland Kitchen aspirin EC 325 MG tablet Take 325 mg by mouth at bedtime.  Marland Kitchen atorvastatin  (LIPITOR) 40 MG tablet Take 1 tablet (40 mg total) by mouth daily.  Marland Kitchen b complex vitamins tablet Take 1 tablet by mouth at bedtime.  . brimonidine (ALPHAGAN) 0.2 % ophthalmic solution INSTILL 1 DROP INTO LEFT EYE TWICE DAILY  . diphenhydramine-acetaminophen (TYLENOL PM) 25-500 MG TABS tablet Take 1 tablet by mouth at bedtime as needed (pain).  . dorzolamide-timolol (COSOPT) 22.3-6.8 MG/ML ophthalmic solution Place 1 drop into the left eye 2 (two) times daily.  Marland Kitchen erythromycin ophthalmic ointment Place 1 application into the left eye at bedtime as needed (dry eyes/itching).   . insulin detemir (LEVEMIR) 100 UNIT/ML injection Inject 15 Units into the skin 2 (two) times daily with a meal.   . lanthanum (FOSRENOL) 1000 MG chewable tablet Chew 1,000 mg by mouth 3 (three) times daily with meals.  . latanoprost (XALATAN) 0.005 % ophthalmic solution Place 1 drop into the left eye at bedtime.  Marland Kitchen lisinopril (PRINIVIL,ZESTRIL) 10 MG tablet Take 10 mg by mouth daily.   . metoprolol tartrate (LOPRESSOR) 25 MG tablet Take 25 mg by mouth 2 (two) times daily.  . Soft Lens Products (REFRESH CONTACTS DROPS) SOLN Place 1 drop into the left eye 2 (two) times daily as needed (dry eyes).  . Vitamin D, Ergocalciferol, (DRISDOL) 50000 units CAPS capsule Take 50,000 Units by mouth 3 (three) times a week.   . [DISCONTINUED] moxifloxacin (VIGAMOX) 0.5 % ophthalmic solution INSTILL 1 DROP INTO LEFT EYE 4 TIMES DAILY  . [DISCONTINUED] RESTASIS 0.05 % ophthalmic emulsion INSTILL 1 DROP INTO EACH EYE TWICE DAILY     Allergies:   Patient has no known allergies.   Social History   Tobacco Use  . Smoking status: Never Smoker  . Smokeless tobacco: Never Used  Substance Use Topics  . Alcohol use: No    Alcohol/week: 0.0 standard drinks  . Drug use: No     Family Hx: The patient's family history includes Alzheimer's disease in his mother; Cancer in his father and sister; Diabetes in his mother; Heart disease in his father;  Stroke in his sister.  ROS:   Please see the history of present illness.    Constitutional: Negative for chills, fever, night sweats, unintentional weight loss  HENT: Negative for ear pain and hearing loss.   Eyes: Negative for loss of vision and eye pain.  Respiratory: Negative for cough, sputum, wheezing.   Cardiovascular: See HPI. Gastrointestinal: Negative for abdominal pain, melena, and hematochezia.  Genitourinary: Negative for dysuria and hematuria.  Musculoskeletal: Negative for falls and myalgias.  Skin: Negative for itching and rash.  Neurological: Negative for focal weakness, focal sensory changes and loss of consciousness.  Endo/Heme/Allergies: Does not bruise/bleed easily.  All other systems reviewed and are negative.   Prior CV studies:   The following studies were reviewed today: Lexiscan MPI 01/22/18  There was no ST segment deviation noted during stress.  The left ventricular ejection fraction is mildly decreased (45-54%).  Nuclear stress EF: 51%,  there is apicalseptal hypokinesis.  This is a low risk study. With no significant ischemia or perfusion defects identified, however ejection fraction is mildly reduced with apical septal hypokinesis.  Monitor 8.8.19 14 day monitor reviewed. Average heart rate 63 bpm. No tachycardia. 25% of usable tracings were sinus bradycardia, 75% normal sinus rhythm. No pauses. 6 patient triggered events, including one for chest pain. All were sinus rhythm. No significant abnormalities noted on monitor.  Echo from 12/22/17 personally reviewed, agree with report: Left ventricle: The cavity size was normal. There was mild concentric hypertrophy. Systolic function was mildly reduced. The estimated ejection fraction was in the range of 45% to 50%. Mild diffuse hypokinesis. Doppler parameters are consistent with abnormal left ventricular relaxation (grade 1 diastolic dysfunction). Doppler parameters are consistent with high  ventricular filling pressure. - Aortic valve: Transvalvular velocity was within the normal range. There was no stenosis. There was no regurgitation. Valve area (VTI): 2.17 cm^2. Valve area (Vmax): 2.16 cm^2. Valve area (Vmean): 2.14 cm^2. - Mitral valve: Transvalvular velocity was within the normal range. There was no evidence for stenosis. There was trivial regurgitation. - Left atrium: The atrium was mildly dilated. - Right ventricle: The cavity size was normal. Wall thickness was normal. Systolic function was normal. - Tricuspid valve: There was trivial regurgitation. - Pulmonary arteries: Systolic pressure was within the normal range. PA peak pressure: 35 mm Hg (S).  Labs/Other Tests and Data Reviewed:    EKG:  An ECG dated 01/16/2018 was personally reviewed today and demonstrated:  sinus bradycardia, borderline QTc interval  Recent Labs: No results found for requested labs within last 8760 hours.   Recent Lipid Panel Lab Results  Component Value Date/Time   CHOL (H) 01/26/2010 05:20 AM    207        ATP III CLASSIFICATION:  <200     mg/dL   Desirable  200-239  mg/dL   Borderline High  >=240    mg/dL   High          TRIG 115 01/26/2010 05:20 AM   HDL 39 (L) 01/26/2010 05:20 AM   CHOLHDL 5.3 01/26/2010 05:20 AM   LDLCALC (H) 01/26/2010 05:20 AM    145        Total Cholesterol/HDL:CHD Risk Coronary Heart Disease Risk Table                     Men   Women  1/2 Average Risk   3.4   3.3  Average Risk       5.0   4.4  2 X Average Risk   9.6   7.1  3 X Average Risk  23.4   11.0        Use the calculated Patient Ratio above and the CHD Risk Table to determine the patient's CHD Risk.        ATP III CLASSIFICATION (LDL):  <100     mg/dL   Optimal  100-129  mg/dL   Near or Above                    Optimal  130-159  mg/dL   Borderline  160-189  mg/dL   High  >190     mg/dL   Very High    Wt Readings from Last 3 Encounters:  04/29/18 211 lb (95.7 kg)   01/22/18 214 lb (97.1 kg)  01/16/18 214 lb (97.1 kg)     Objective:    Vital Signs:  There were no vitals taken for this visit.   Speaking comfortably on phone In no acute distress Alert and oriented Normal affect  ASSESSMENT & PLAN:    History of CVA: no further events -discussed than from a CV standpoint, ok to drop to 81 mg aspirin. He will discuss with his PCP -no active bleeding  ESRD on dialysis, complicated by hypertension and type II diabetes: -does not have vitals today, but endorses good control of BP post dialysis -management of ESRD/HTN by nephrology -continue amlodipine, lisinopril  -diabetes management per PCP/endo  Hyperlipidemia:  Lipids 02/03/19: Tchol 161, HDL 50, LDL 99, TG 41 -continue atorvastatin 40 mg -Ideally would like LDL <70, but there is unclear data for targets in ESRD  Palpitations: infrequent, manageable -continue metoprolol -call if become more frequent -no high risk features  Cardiovascular risk factors and prevention -discussed lifestyle recommendations. Exercise ability limited given legal blindness. On renal diet.  COVID-19 Education: The signs and symptoms of COVID-19 were discussed with the patient and how to seek care for testing (follow up with PCP or arrange E-visit).  The importance of social distancing was discussed today.  Time:   Today, I have spent 52minutes with the patient with telehealth technology discussing the above problems.    No changes to medications today.  Follow Up:  12 mos  Signed, Shawn Dresser, MD  05/05/2019 9:56 AM     [facility]

## 2019-05-05 NOTE — Patient Instructions (Signed)
Medication Instructions:  Your Physician recommend you continue on your current medication as directed.    *If you need a refill on your cardiac medications before your next appointment, please call your pharmacy*  Lab Work: None  Testing/Procedures: None  Follow-Up: At [facility], you and your health needs are our priority.  As part of our continuing mission to provide you with exceptional heart care, we have created designated Provider Care Teams.  These Care Teams include your primary Cardiologist (physician) and Advanced Practice Providers (APPs -  Physician Assistants and Nurse Practitioners) who all work together to provide you with the care you need, when you need it.  Your next appointment:   12 months  The format for your next appointment:   In Person  Provider:   Bridgette Maximino, MD    

## 2019-05-06 DIAGNOSIS — E1129 Type 2 diabetes mellitus with other diabetic kidney complication: Secondary | ICD-10-CM | POA: Diagnosis not present

## 2019-05-06 DIAGNOSIS — D509 Iron deficiency anemia, unspecified: Secondary | ICD-10-CM | POA: Diagnosis not present

## 2019-05-06 DIAGNOSIS — E876 Hypokalemia: Secondary | ICD-10-CM | POA: Diagnosis not present

## 2019-05-06 DIAGNOSIS — N2581 Secondary hyperparathyroidism of renal origin: Secondary | ICD-10-CM | POA: Diagnosis not present

## 2019-05-06 DIAGNOSIS — Z992 Dependence on renal dialysis: Secondary | ICD-10-CM | POA: Diagnosis not present

## 2019-05-06 DIAGNOSIS — E119 Type 2 diabetes mellitus without complications: Secondary | ICD-10-CM | POA: Diagnosis not present

## 2019-05-06 DIAGNOSIS — N186 End stage renal disease: Secondary | ICD-10-CM | POA: Diagnosis not present

## 2019-05-08 DIAGNOSIS — E119 Type 2 diabetes mellitus without complications: Secondary | ICD-10-CM | POA: Diagnosis not present

## 2019-05-08 DIAGNOSIS — E876 Hypokalemia: Secondary | ICD-10-CM | POA: Diagnosis not present

## 2019-05-08 DIAGNOSIS — Z992 Dependence on renal dialysis: Secondary | ICD-10-CM | POA: Diagnosis not present

## 2019-05-08 DIAGNOSIS — N2581 Secondary hyperparathyroidism of renal origin: Secondary | ICD-10-CM | POA: Diagnosis not present

## 2019-05-08 DIAGNOSIS — D509 Iron deficiency anemia, unspecified: Secondary | ICD-10-CM | POA: Diagnosis not present

## 2019-05-08 DIAGNOSIS — N186 End stage renal disease: Secondary | ICD-10-CM | POA: Diagnosis not present

## 2019-05-10 DIAGNOSIS — I120 Hypertensive chronic kidney disease with stage 5 chronic kidney disease or end stage renal disease: Secondary | ICD-10-CM | POA: Diagnosis not present

## 2019-05-10 DIAGNOSIS — N186 End stage renal disease: Secondary | ICD-10-CM | POA: Diagnosis not present

## 2019-05-10 DIAGNOSIS — Z992 Dependence on renal dialysis: Secondary | ICD-10-CM | POA: Diagnosis not present

## 2019-05-11 DIAGNOSIS — E876 Hypokalemia: Secondary | ICD-10-CM | POA: Diagnosis not present

## 2019-05-11 DIAGNOSIS — Z992 Dependence on renal dialysis: Secondary | ICD-10-CM | POA: Diagnosis not present

## 2019-05-11 DIAGNOSIS — N186 End stage renal disease: Secondary | ICD-10-CM | POA: Diagnosis not present

## 2019-05-11 DIAGNOSIS — D631 Anemia in chronic kidney disease: Secondary | ICD-10-CM | POA: Diagnosis not present

## 2019-05-11 DIAGNOSIS — N2581 Secondary hyperparathyroidism of renal origin: Secondary | ICD-10-CM | POA: Diagnosis not present

## 2019-05-11 DIAGNOSIS — D509 Iron deficiency anemia, unspecified: Secondary | ICD-10-CM | POA: Diagnosis not present

## 2019-05-11 DIAGNOSIS — E119 Type 2 diabetes mellitus without complications: Secondary | ICD-10-CM | POA: Diagnosis not present

## 2019-05-13 DIAGNOSIS — N2581 Secondary hyperparathyroidism of renal origin: Secondary | ICD-10-CM | POA: Diagnosis not present

## 2019-05-13 DIAGNOSIS — D509 Iron deficiency anemia, unspecified: Secondary | ICD-10-CM | POA: Diagnosis not present

## 2019-05-13 DIAGNOSIS — D631 Anemia in chronic kidney disease: Secondary | ICD-10-CM | POA: Diagnosis not present

## 2019-05-13 DIAGNOSIS — N186 End stage renal disease: Secondary | ICD-10-CM | POA: Diagnosis not present

## 2019-05-13 DIAGNOSIS — Z992 Dependence on renal dialysis: Secondary | ICD-10-CM | POA: Diagnosis not present

## 2019-05-13 DIAGNOSIS — E119 Type 2 diabetes mellitus without complications: Secondary | ICD-10-CM | POA: Diagnosis not present

## 2019-05-15 DIAGNOSIS — D631 Anemia in chronic kidney disease: Secondary | ICD-10-CM | POA: Diagnosis not present

## 2019-05-15 DIAGNOSIS — N186 End stage renal disease: Secondary | ICD-10-CM | POA: Diagnosis not present

## 2019-05-15 DIAGNOSIS — Z992 Dependence on renal dialysis: Secondary | ICD-10-CM | POA: Diagnosis not present

## 2019-05-15 DIAGNOSIS — E119 Type 2 diabetes mellitus without complications: Secondary | ICD-10-CM | POA: Diagnosis not present

## 2019-05-15 DIAGNOSIS — D509 Iron deficiency anemia, unspecified: Secondary | ICD-10-CM | POA: Diagnosis not present

## 2019-05-15 DIAGNOSIS — N2581 Secondary hyperparathyroidism of renal origin: Secondary | ICD-10-CM | POA: Diagnosis not present

## 2019-05-18 DIAGNOSIS — Z992 Dependence on renal dialysis: Secondary | ICD-10-CM | POA: Diagnosis not present

## 2019-05-18 DIAGNOSIS — N2581 Secondary hyperparathyroidism of renal origin: Secondary | ICD-10-CM | POA: Diagnosis not present

## 2019-05-18 DIAGNOSIS — D631 Anemia in chronic kidney disease: Secondary | ICD-10-CM | POA: Diagnosis not present

## 2019-05-18 DIAGNOSIS — N186 End stage renal disease: Secondary | ICD-10-CM | POA: Diagnosis not present

## 2019-05-18 DIAGNOSIS — E119 Type 2 diabetes mellitus without complications: Secondary | ICD-10-CM | POA: Diagnosis not present

## 2019-05-18 DIAGNOSIS — D509 Iron deficiency anemia, unspecified: Secondary | ICD-10-CM | POA: Diagnosis not present

## 2019-05-19 DIAGNOSIS — E559 Vitamin D deficiency, unspecified: Secondary | ICD-10-CM | POA: Diagnosis not present

## 2019-05-19 DIAGNOSIS — E7800 Pure hypercholesterolemia, unspecified: Secondary | ICD-10-CM | POA: Diagnosis not present

## 2019-05-19 DIAGNOSIS — I999 Unspecified disorder of circulatory system: Secondary | ICD-10-CM | POA: Diagnosis not present

## 2019-05-19 DIAGNOSIS — E1165 Type 2 diabetes mellitus with hyperglycemia: Secondary | ICD-10-CM | POA: Diagnosis not present

## 2019-05-19 DIAGNOSIS — I6990 Unspecified sequelae of unspecified cerebrovascular disease: Secondary | ICD-10-CM | POA: Diagnosis not present

## 2019-05-19 DIAGNOSIS — I119 Hypertensive heart disease without heart failure: Secondary | ICD-10-CM | POA: Diagnosis not present

## 2019-05-19 DIAGNOSIS — Z79899 Other long term (current) drug therapy: Secondary | ICD-10-CM | POA: Diagnosis not present

## 2019-05-20 DIAGNOSIS — Z992 Dependence on renal dialysis: Secondary | ICD-10-CM | POA: Diagnosis not present

## 2019-05-20 DIAGNOSIS — N186 End stage renal disease: Secondary | ICD-10-CM | POA: Diagnosis not present

## 2019-05-20 DIAGNOSIS — D509 Iron deficiency anemia, unspecified: Secondary | ICD-10-CM | POA: Diagnosis not present

## 2019-05-20 DIAGNOSIS — N2581 Secondary hyperparathyroidism of renal origin: Secondary | ICD-10-CM | POA: Diagnosis not present

## 2019-05-20 DIAGNOSIS — E119 Type 2 diabetes mellitus without complications: Secondary | ICD-10-CM | POA: Diagnosis not present

## 2019-05-20 DIAGNOSIS — D631 Anemia in chronic kidney disease: Secondary | ICD-10-CM | POA: Diagnosis not present

## 2019-05-22 DIAGNOSIS — E119 Type 2 diabetes mellitus without complications: Secondary | ICD-10-CM | POA: Diagnosis not present

## 2019-05-22 DIAGNOSIS — D631 Anemia in chronic kidney disease: Secondary | ICD-10-CM | POA: Diagnosis not present

## 2019-05-22 DIAGNOSIS — Z992 Dependence on renal dialysis: Secondary | ICD-10-CM | POA: Diagnosis not present

## 2019-05-22 DIAGNOSIS — N186 End stage renal disease: Secondary | ICD-10-CM | POA: Diagnosis not present

## 2019-05-22 DIAGNOSIS — N2581 Secondary hyperparathyroidism of renal origin: Secondary | ICD-10-CM | POA: Diagnosis not present

## 2019-05-22 DIAGNOSIS — D509 Iron deficiency anemia, unspecified: Secondary | ICD-10-CM | POA: Diagnosis not present

## 2019-05-25 ENCOUNTER — Inpatient Hospital Stay (HOSPITAL_COMMUNITY)
Admission: EM | Admit: 2019-05-25 | Discharge: 2019-06-01 | DRG: 682 | Disposition: A | Discharge status: Home Health | Payer: Medicare Other | Attending: Internal Medicine | Admitting: Internal Medicine

## 2019-05-25 ENCOUNTER — Emergency Department (HOSPITAL_COMMUNITY): Payer: Medicare Other

## 2019-05-25 ENCOUNTER — Inpatient Hospital Stay (HOSPITAL_COMMUNITY): Payer: Medicare Other

## 2019-05-25 ENCOUNTER — Other Ambulatory Visit: Payer: Self-pay

## 2019-05-25 DIAGNOSIS — Z992 Dependence on renal dialysis: Secondary | ICD-10-CM

## 2019-05-25 DIAGNOSIS — E872 Acidosis: Secondary | ICD-10-CM | POA: Diagnosis present

## 2019-05-25 DIAGNOSIS — N186 End stage renal disease: Principal | ICD-10-CM | POA: Diagnosis present

## 2019-05-25 DIAGNOSIS — J9602 Acute respiratory failure with hypercapnia: Secondary | ICD-10-CM | POA: Diagnosis present

## 2019-05-25 DIAGNOSIS — R4020 Unspecified coma: Secondary | ICD-10-CM

## 2019-05-25 DIAGNOSIS — G9341 Metabolic encephalopathy: Secondary | ICD-10-CM | POA: Diagnosis present

## 2019-05-25 DIAGNOSIS — I120 Hypertensive chronic kidney disease with stage 5 chronic kidney disease or end stage renal disease: Secondary | ICD-10-CM | POA: Diagnosis present

## 2019-05-25 DIAGNOSIS — E8770 Fluid overload, unspecified: Secondary | ICD-10-CM

## 2019-05-25 DIAGNOSIS — I440 Atrioventricular block, first degree: Secondary | ICD-10-CM | POA: Diagnosis present

## 2019-05-25 DIAGNOSIS — I898 Other specified noninfective disorders of lymphatic vessels and lymph nodes: Secondary | ICD-10-CM | POA: Diagnosis present

## 2019-05-25 DIAGNOSIS — Z823 Family history of stroke: Secondary | ICD-10-CM

## 2019-05-25 DIAGNOSIS — R400 Somnolence: Secondary | ICD-10-CM

## 2019-05-25 DIAGNOSIS — N2581 Secondary hyperparathyroidism of renal origin: Secondary | ICD-10-CM | POA: Diagnosis present

## 2019-05-25 DIAGNOSIS — J90 Pleural effusion, not elsewhere classified: Secondary | ICD-10-CM | POA: Diagnosis present

## 2019-05-25 DIAGNOSIS — D649 Anemia, unspecified: Secondary | ICD-10-CM | POA: Diagnosis present

## 2019-05-25 DIAGNOSIS — R55 Syncope and collapse: Secondary | ICD-10-CM | POA: Diagnosis not present

## 2019-05-25 DIAGNOSIS — Z8249 Family history of ischemic heart disease and other diseases of the circulatory system: Secondary | ICD-10-CM

## 2019-05-25 DIAGNOSIS — E1122 Type 2 diabetes mellitus with diabetic chronic kidney disease: Secondary | ICD-10-CM | POA: Diagnosis present

## 2019-05-25 DIAGNOSIS — J9811 Atelectasis: Secondary | ICD-10-CM | POA: Diagnosis present

## 2019-05-25 DIAGNOSIS — Z8673 Personal history of transient ischemic attack (TIA), and cerebral infarction without residual deficits: Secondary | ICD-10-CM

## 2019-05-25 DIAGNOSIS — R001 Bradycardia, unspecified: Secondary | ICD-10-CM | POA: Diagnosis present

## 2019-05-25 DIAGNOSIS — J948 Other specified pleural conditions: Secondary | ICD-10-CM | POA: Diagnosis not present

## 2019-05-25 DIAGNOSIS — I959 Hypotension, unspecified: Secondary | ICD-10-CM | POA: Diagnosis present

## 2019-05-25 DIAGNOSIS — E8779 Other fluid overload: Secondary | ICD-10-CM

## 2019-05-25 DIAGNOSIS — E11649 Type 2 diabetes mellitus with hypoglycemia without coma: Secondary | ICD-10-CM | POA: Diagnosis not present

## 2019-05-25 DIAGNOSIS — J9601 Acute respiratory failure with hypoxia: Secondary | ICD-10-CM | POA: Diagnosis present

## 2019-05-25 DIAGNOSIS — E785 Hyperlipidemia, unspecified: Secondary | ICD-10-CM | POA: Diagnosis present

## 2019-05-25 DIAGNOSIS — Z9981 Dependence on supplemental oxygen: Secondary | ICD-10-CM

## 2019-05-25 DIAGNOSIS — Z820 Family history of epilepsy and other diseases of the nervous system: Secondary | ICD-10-CM | POA: Diagnosis not present

## 2019-05-25 DIAGNOSIS — Z560 Unemployment, unspecified: Secondary | ICD-10-CM

## 2019-05-25 DIAGNOSIS — D631 Anemia in chronic kidney disease: Secondary | ICD-10-CM | POA: Diagnosis not present

## 2019-05-25 DIAGNOSIS — Z03818 Encounter for observation for suspected exposure to other biological agents ruled out: Secondary | ICD-10-CM | POA: Diagnosis not present

## 2019-05-25 DIAGNOSIS — E119 Type 2 diabetes mellitus without complications: Secondary | ICD-10-CM | POA: Diagnosis not present

## 2019-05-25 DIAGNOSIS — Z833 Family history of diabetes mellitus: Secondary | ICD-10-CM | POA: Diagnosis not present

## 2019-05-25 DIAGNOSIS — Z9889 Other specified postprocedural states: Secondary | ICD-10-CM

## 2019-05-25 DIAGNOSIS — I10 Essential (primary) hypertension: Secondary | ICD-10-CM | POA: Diagnosis not present

## 2019-05-25 DIAGNOSIS — H548 Legal blindness, as defined in USA: Secondary | ICD-10-CM | POA: Diagnosis present

## 2019-05-25 DIAGNOSIS — Z79899 Other long term (current) drug therapy: Secondary | ICD-10-CM

## 2019-05-25 DIAGNOSIS — Z20828 Contact with and (suspected) exposure to other viral communicable diseases: Secondary | ICD-10-CM | POA: Diagnosis present

## 2019-05-25 DIAGNOSIS — E8889 Other specified metabolic disorders: Secondary | ICD-10-CM | POA: Diagnosis present

## 2019-05-25 DIAGNOSIS — M1990 Unspecified osteoarthritis, unspecified site: Secondary | ICD-10-CM | POA: Diagnosis present

## 2019-05-25 DIAGNOSIS — E118 Type 2 diabetes mellitus with unspecified complications: Secondary | ICD-10-CM

## 2019-05-25 DIAGNOSIS — R0602 Shortness of breath: Secondary | ICD-10-CM

## 2019-05-25 DIAGNOSIS — D509 Iron deficiency anemia, unspecified: Secondary | ICD-10-CM | POA: Diagnosis not present

## 2019-05-25 DIAGNOSIS — G9340 Encephalopathy, unspecified: Secondary | ICD-10-CM | POA: Diagnosis not present

## 2019-05-25 DIAGNOSIS — J9819 Other pulmonary collapse: Secondary | ICD-10-CM | POA: Diagnosis not present

## 2019-05-25 DIAGNOSIS — J918 Pleural effusion in other conditions classified elsewhere: Secondary | ICD-10-CM | POA: Diagnosis not present

## 2019-05-25 DIAGNOSIS — Z794 Long term (current) use of insulin: Secondary | ICD-10-CM

## 2019-05-25 LAB — CBC WITH DIFFERENTIAL/PLATELET
Abs Immature Granulocytes: 0.04 10*3/uL (ref 0.00–0.07)
Basophils Absolute: 0 10*3/uL (ref 0.0–0.1)
Basophils Relative: 0 %
Eosinophils Absolute: 0.1 10*3/uL (ref 0.0–0.5)
Eosinophils Relative: 1 %
HCT: 34.2 % — ABNORMAL LOW (ref 39.0–52.0)
Hemoglobin: 10.1 g/dL — ABNORMAL LOW (ref 13.0–17.0)
Immature Granulocytes: 1 %
Lymphocytes Relative: 9 %
Lymphs Abs: 0.6 10*3/uL — ABNORMAL LOW (ref 0.7–4.0)
MCH: 29.8 pg (ref 26.0–34.0)
MCHC: 29.5 g/dL — ABNORMAL LOW (ref 30.0–36.0)
MCV: 100.9 fL — ABNORMAL HIGH (ref 80.0–100.0)
Monocytes Absolute: 0.7 10*3/uL (ref 0.1–1.0)
Monocytes Relative: 12 %
Neutro Abs: 4.8 10*3/uL (ref 1.7–7.7)
Neutrophils Relative %: 77 %
Platelets: 146 10*3/uL — ABNORMAL LOW (ref 150–400)
RBC: 3.39 MIL/uL — ABNORMAL LOW (ref 4.22–5.81)
RDW: 20 % — ABNORMAL HIGH (ref 11.5–15.5)
WBC: 6.2 10*3/uL (ref 4.0–10.5)
nRBC: 0.3 % — ABNORMAL HIGH (ref 0.0–0.2)

## 2019-05-25 LAB — TROPONIN I (HIGH SENSITIVITY)
Troponin I (High Sensitivity): 10 ng/L (ref <18)
Troponin I (High Sensitivity): 12 ng/L (ref <18)

## 2019-05-25 LAB — BODY FLUID CELL COUNT WITH DIFFERENTIAL
Eos, Fluid: 0 %
Lymphs, Fluid: 37 %
Monocyte-Macrophage-Serous Fluid: 57 % (ref 50–90)
Neutrophil Count, Fluid: 6 % (ref 0–25)
Total Nucleated Cell Count, Fluid: 64 cu mm (ref 0–1000)

## 2019-05-25 LAB — I-STAT CHEM 8, ED
BUN: 10 mg/dL (ref 6–20)
Calcium, Ion: 1.03 mmol/L — ABNORMAL LOW (ref 1.15–1.40)
Chloride: 96 mmol/L — ABNORMAL LOW (ref 98–111)
Creatinine, Ser: 3.2 mg/dL — ABNORMAL HIGH (ref 0.61–1.24)
Glucose, Bld: 154 mg/dL — ABNORMAL HIGH (ref 70–99)
HCT: 53 % — ABNORMAL HIGH (ref 39.0–52.0)
Hemoglobin: 18 g/dL — ABNORMAL HIGH (ref 13.0–17.0)
Potassium: 4 mmol/L (ref 3.5–5.1)
Sodium: 135 mmol/L (ref 135–145)
TCO2: 23 mmol/L (ref 22–32)

## 2019-05-25 LAB — COMPREHENSIVE METABOLIC PANEL
ALT: 15 U/L (ref 0–44)
AST: 23 U/L (ref 15–41)
Albumin: 3.5 g/dL (ref 3.5–5.0)
Alkaline Phosphatase: 103 U/L (ref 38–126)
Anion gap: 15 (ref 5–15)
BUN: 9 mg/dL (ref 6–20)
CO2: 27 mmol/L (ref 22–32)
Calcium: 8.4 mg/dL — ABNORMAL LOW (ref 8.9–10.3)
Chloride: 92 mmol/L — ABNORMAL LOW (ref 98–111)
Creatinine, Ser: 3.82 mg/dL — ABNORMAL HIGH (ref 0.61–1.24)
GFR calc Af Amer: 19 mL/min — ABNORMAL LOW (ref >60)
GFR calc non Af Amer: 16 mL/min — ABNORMAL LOW (ref >60)
Glucose, Bld: 156 mg/dL — ABNORMAL HIGH (ref 70–99)
Potassium: 4.1 mmol/L (ref 3.5–5.1)
Sodium: 134 mmol/L — ABNORMAL LOW (ref 135–145)
Total Bilirubin: 1.4 mg/dL — ABNORMAL HIGH (ref 0.3–1.2)
Total Protein: 7.1 g/dL (ref 6.5–8.1)

## 2019-05-25 LAB — LACTIC ACID, PLASMA
Lactic Acid, Venous: 2.7 mmol/L (ref 0.5–1.9)
Lactic Acid, Venous: 2.2 mmol/L (ref 0.5–1.9)

## 2019-05-25 LAB — PROTEIN, PLEURAL OR PERITONEAL FLUID: Total protein, fluid: 4.4 g/dL

## 2019-05-25 LAB — POCT I-STAT 7, (LYTES, BLD GAS, ICA,H+H)
Acid-base deficit: 3 mmol/L — ABNORMAL HIGH (ref 0.0–2.0)
Bicarbonate: 27.1 mmol/L (ref 20.0–28.0)
Calcium, Ion: 1.14 mmol/L — ABNORMAL LOW (ref 1.15–1.40)
HCT: 51 % (ref 39.0–52.0)
Hemoglobin: 17.3 g/dL — ABNORMAL HIGH (ref 13.0–17.0)
O2 Saturation: 93 %
Potassium: 4.3 mmol/L (ref 3.5–5.1)
Sodium: 133 mmol/L — ABNORMAL LOW (ref 135–145)
TCO2: 29 mmol/L (ref 22–32)
pCO2 arterial: 68.5 mmHg (ref 32.0–48.0)
pH, Arterial: 7.205 — ABNORMAL LOW (ref 7.350–7.450)
pO2, Arterial: 85 mmHg (ref 83.0–108.0)

## 2019-05-25 LAB — LACTATE DEHYDROGENASE, PLEURAL OR PERITONEAL FLUID: LD, Fluid: 80 U/L — ABNORMAL HIGH (ref 3–23)

## 2019-05-25 LAB — CBG MONITORING, ED
Glucose-Capillary: 144 mg/dL — ABNORMAL HIGH (ref 70–99)
Glucose-Capillary: 155 mg/dL — ABNORMAL HIGH (ref 70–99)

## 2019-05-25 LAB — APTT: aPTT: 40 seconds — ABNORMAL HIGH (ref 24–36)

## 2019-05-25 LAB — SARS CORONAVIRUS 2 (TAT 6-24 HRS): SARS Coronavirus 2: NEGATIVE

## 2019-05-25 LAB — PROTIME-INR
INR: 1.4 — ABNORMAL HIGH (ref 0.8–1.2)
Prothrombin Time: 17.2 seconds — ABNORMAL HIGH (ref 11.4–15.2)

## 2019-05-25 LAB — MAGNESIUM: Magnesium: 2.4 mg/dL (ref 1.7–2.4)

## 2019-05-25 LAB — GLUCOSE, PLEURAL OR PERITONEAL FLUID: Glucose, Fluid: 181 mg/dL

## 2019-05-25 LAB — AMMONIA: Ammonia: 41 umol/L — ABNORMAL HIGH (ref 9–35)

## 2019-05-25 MED ORDER — CALCIUM GLUCONATE 10 % IV SOLN
1.0000 g | Freq: Once | INTRAVENOUS | Status: AC
  Administered 2019-05-25: 1 g via INTRAVENOUS
  Filled 2019-05-25: qty 10

## 2019-05-25 MED ORDER — SODIUM CHLORIDE 0.9 % IV SOLN
100.0000 mg | Freq: Once | INTRAVENOUS | Status: DC
  Administered 2019-05-25: 100 mg via INTRAVENOUS
  Filled 2019-05-25: qty 100

## 2019-05-25 MED ORDER — PANTOPRAZOLE SODIUM 40 MG IV SOLR
40.0000 mg | Freq: Every day | INTRAVENOUS | Status: DC
  Administered 2019-05-26 (×2): 40 mg via INTRAVENOUS
  Filled 2019-05-25 (×2): qty 40

## 2019-05-25 MED ORDER — ONDANSETRON HCL 4 MG/2ML IJ SOLN: 4.0000 mg | Freq: Four times a day (QID) | INTRAMUSCULAR | Status: DC | PRN

## 2019-05-25 MED ORDER — MIDODRINE HCL 5 MG PO TABS
10.0000 mg | ORAL_TABLET | Freq: Three times a day (TID) | ORAL | Status: DC
  Administered 2019-05-26 – 2019-05-29 (×11): 10 mg via ORAL
  Filled 2019-05-25 (×12): qty 2

## 2019-05-25 MED ORDER — SODIUM CHLORIDE 0.9 % IV SOLN
2.0000 g | INTRAVENOUS | Status: DC
  Administered 2019-05-25: 2 g via INTRAVENOUS
  Filled 2019-05-25: qty 20

## 2019-05-25 MED ORDER — INSULIN ASPART 100 UNIT/ML ~~LOC~~ SOLN
0.0000 [IU] | SUBCUTANEOUS | Status: DC
  Administered 2019-05-26 – 2019-05-27 (×2): 2 [IU] via SUBCUTANEOUS

## 2019-05-25 MED ORDER — ACETAMINOPHEN 325 MG PO TABS
650.0000 mg | ORAL_TABLET | ORAL | Status: DC | PRN
  Administered 2019-05-26 – 2019-05-31 (×2): 650 mg via ORAL
  Filled 2019-05-25: qty 2

## 2019-05-25 MED ORDER — PIPERACILLIN-TAZOBACTAM IN DEX 2-0.25 GM/50ML IV SOLN
2.2500 g | Freq: Three times a day (TID) | INTRAVENOUS | Status: DC
  Administered 2019-05-25 – 2019-05-26 (×2): 2.25 g via INTRAVENOUS
  Filled 2019-05-25 (×5): qty 50

## 2019-05-25 MED ORDER — VANCOMYCIN HCL 10 G IV SOLR
2500.0000 mg | Freq: Once | INTRAVENOUS | Status: AC
  Administered 2019-05-25: 17:00:00 2500 mg via INTRAVENOUS
  Filled 2019-05-25: qty 2500

## 2019-05-25 MED ORDER — SODIUM BICARBONATE 8.4 % IV SOLN
50.0000 meq | Freq: Once | INTRAVENOUS | Status: AC
  Administered 2019-05-25: 50 meq via INTRAVENOUS

## 2019-05-25 MED ORDER — VANCOMYCIN HCL IN DEXTROSE 1-5 GM/200ML-% IV SOLN: 1000.0000 mg | INTRAVENOUS | Status: DC

## 2019-05-25 MED ORDER — DEXTROSE 50 % IV SOLN
1.0000 | Freq: Once | INTRAVENOUS | Status: DC
  Filled 2019-05-25: qty 50

## 2019-05-25 MED ORDER — INSULIN ASPART 100 UNIT/ML IV SOLN: 5.0000 [IU] | Freq: Once | INTRAVENOUS | Status: DC

## 2019-05-25 MED ORDER — HEPARIN SODIUM (PORCINE) 5000 UNIT/ML IJ SOLN
5000.0000 [IU] | Freq: Three times a day (TID) | INTRAMUSCULAR | Status: DC
  Administered 2019-05-26 – 2019-06-01 (×19): 5000 [IU] via SUBCUTANEOUS
  Filled 2019-05-25 (×19): qty 1

## 2019-05-25 NOTE — Procedures (Signed)
Thora w/ [place] Note Timeout performed, consent obtained verbally Right chest examined with [place] and skin overlying fluid pocket marked Area prepped and anesthesized with 1% lidocaine 1500  cc milky brown  fluid removed Bandaid applied to site CXR pending No immediate complications

## 2019-05-25 NOTE — ED Notes (Signed)
MD Marlou Porch has evaluated the patient. Removed the patient from pacing mode. Pt HR is 58, appears Sinus brady.

## 2019-05-25 NOTE — ED Notes (Addendum)
Pt carotid pulses remain faint, 1+. Pulses not matching up to pacemaker settings of rate of 80 @ 130 ma. Pulses palpable at a rate of 40-50. MD Kathrynn Humble was brought in the room to reevaluate the patient and view chest Xray. MD requested pt be placed on bipap. RT was notified. RT at bedside. Pt remains drowsy and unresponsive unless stimulated with effort. Pt can open his eyes after much effort by the nurse and can respond appropriately to questions when alert.

## 2019-05-25 NOTE — ED Provider Notes (Addendum)
[facility] EMERGENCY DEPARTMENT Provider Note   CSN: [id number] Arrival date & time: 05/25/19  1105     History   Chief Complaint Chief Complaint  Patient presents with   post cpr    HPI Shawn [name] is a 58 y.o. male.    HPI  58 year old comes in a chief complaint unresponsiveness. LEVEL 5 CAVEAT FOR CHANGE IN MENTAL STATUS.   Patient has history of ESRD on hemodialysis, stroke, syncope.  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  According to EMS, patient was completing his hemodialysis and suddenly passed out.  When they arrived patient was minimally responsive.  They noted that he was bradycardic and had diminished pulse.  They started pacing him, started epinephrine drip and brought him to the ER.  In route patient became more responsive but he still somnolent.  Patient has no complaints from his side.  He reports that he had a near syncope type event earlier today.  He does not have any numbness, tingling, chest pain, shortness of breath.  Wife reports that patient had a fall at home prior to HD today - she didn't see him fall and patient was responsive. Patient was staggering.Last normal y'day.  No new pain meds/psych meds. No n/v/f/c.  Past Medical History:  Diagnosis Date   Anemia    Arthritis    patient denies   Blind right eye    Chronic kidney disease    Dialysis since [id number] MWF   Complication of anesthesia    Diabetes mellitus    type II   Hypertension    Legally blind    No pertinent past medical history    PONV (postoperative nausea and vomiting)    N/V- became dehydrated had to have IV fluids- 01/2015   Shortness of breath dyspnea    when has too fluid   Stroke [facility]) 2012   date per patient   Syncope    Unspecified cerebral artery occlusion with cerebral infarction 04/02/2013    Patient Active Problem List   Diagnosis Date Noted   History of CVA (cerebrovascular accident) 05/05/2019    Pure hypercholesterolemia 05/05/2019   Pain due to onychomycosis of toenails of both feet 12/23/2018   Heart palpitations 12/21/2017   Tingling of left upper extremity 12/21/2017   Syncope 01/23/2016   Hypertension 01/23/2016   Orthostatic hypotension 01/23/2016   Legally blind    Diabetes ([place])    Diabetes mellitus with complication ([place])    Intracranial atherosclerosis 08/10/2014   End stage renal disease ([place]) 06/12/2013   ESRD on dialysis ([place]) 04/17/2013   Cerebral artery occlusion with cerebral infarction ([place]) 04/02/2013   Dizziness and giddiness 04/02/2013    Past Surgical History:  Procedure Laterality Date   AV FISTULA PLACEMENT Right 05/12/2013   Procedure: INSERTION OF ARTERIOVENOUS (AV) GORE-TEX GRAFT ARM; ULTRASOUND GUIDED;  Surgeon: Conrad Georgetown, MD;  Location: [facility];  Service: Vascular;  Laterality: Right;   AV FISTULA PLACEMENT Left 01/18/2015   Procedure: INSERTION OF ARTERIOVENOUS GORE-TEX GRAFT LEFT UPPER ARM;  Surgeon: Conrad Custer, MD;  Location: [facility];  Service: Vascular;  Laterality: Left;   COLONOSCOPY  06/2012   EYE SURGERY Bilateral    retina surgery both eyes, cataract surgery both eyes   HERNIA REPAIR     right inguinal   INSERTION OF DIALYSIS CATHETER Right    left arm graft  10/2010   LIGATION ARTERIOVENOUS GORTEX GRAFT Left 05/03/2015  Procedure: LIGATION ARTERIOVENOUS GORTEX GRAFT-LEFT UPPER ARM;  Surgeon: Conrad Leupp, MD;  Location: [facility];  Service: Vascular;  Laterality: Left;        Home Medications    Prior to Admission medications   Medication Sig Start Date End Date Taking? Authorizing Provider  amLODipine (NORVASC) 10 MG tablet Take 10 mg by mouth at bedtime.   Yes [provider]  brimonidine (ALPHAGAN) 0.2 % ophthalmic solution Place 1 drop into the left eye 2 (two) times daily.  11/19/18  Yes [provider]  insulin detemir (LEVEMIR) 100 UNIT/ML injection Inject 15 Units into the skin daily  after breakfast.    Yes [provider]  lanthanum (FOSRENOL) 1000 MG chewable tablet Chew 1,000 mg by mouth 3 (three) times daily with meals.   Yes [provider]  latanoprost (XALATAN) 0.005 % ophthalmic solution Place 1 drop into the left eye at bedtime.   Yes [provider]  lisinopril (PRINIVIL,ZESTRIL) 10 MG tablet Take 10 mg by mouth daily.  06/11/18  Yes [provider]  metoprolol tartrate (LOPRESSOR) 25 MG tablet Take 25 mg by mouth 2 (two) times daily. 12/25/17  Yes [provider]  aspirin EC 325 MG tablet Take 325 mg by mouth at bedtime.    [provider]  atorvastatin (LIPITOR) 40 MG tablet Take 1 tablet (40 mg total) by mouth daily. Patient not taking: Reported on 05/25/2019 04/29/18 05/05/19  Buford Dresser, MD  b complex vitamins tablet Take 1 tablet by mouth at bedtime.    [provider]  diphenhydramine-acetaminophen (TYLENOL PM) 25-500 MG TABS tablet Take 1 tablet by mouth at bedtime as needed (pain).    [provider]  dorzolamide-timolol (COSOPT) 22.3-6.8 MG/ML ophthalmic solution Place 1 drop into the left eye 2 (two) times daily.    [provider]  erythromycin ophthalmic ointment Place 1 application into the left eye at bedtime as needed (dry eyes/itching).  12/12/17   [provider]    Family History Family History  Problem Relation Age of Onset   Diabetes Mother    Alzheimer's disease Mother    Cancer Father    Heart disease Father    Cancer Sister    Stroke Sister     Social History Social History   Tobacco Use   Smoking status: Never Smoker   Smokeless tobacco: Never Used  Substance Use Topics   Alcohol use: No    Alcohol/week: 0.0 standard drinks   Drug use: No     Allergies   Patient has no known allergies.   Review of Systems Review of Systems  Unable to perform ROS: Mental status change     Physical Exam Updated Vital  Signs BP 129/84    Pulse (!) 59    Resp 19    Ht 6' (1.829 m)    Wt 106 kg    SpO2 99%    BMI 31.69 kg/m   Physical Exam Vitals signs and nursing note reviewed.  Constitutional:      General: He is not in acute distress.    Appearance: He is well-developed. He is ill-appearing. He is not diaphoretic.  HENT:     Head: Atraumatic.  Neck:     Musculoskeletal: Neck supple.  Cardiovascular:     Rate and Rhythm: Bradycardia present.  Skin:    General: Skin is warm.  Neurological:     Mental Status: He is oriented to person, place, and time.  Comments: Moving all 4 extremity, gross sensory exam is normal. Unable to assess vision, ocular movements      ED Treatments / Results  Labs (all labs ordered are listed, but only abnormal results are displayed) Labs Reviewed  CBC WITH DIFFERENTIAL/PLATELET - Abnormal; Notable for the following components:      Result Value   RBC 3.39 (*)    Hemoglobin 10.1 (*)    HCT 34.2 (*)    MCV 100.9 (*)    MCHC 29.5 (*)    RDW 20.0 (*)    Platelets 146 (*)    nRBC 0.3 (*)    Lymphs Abs 0.6 (*)    All other components within normal limits  LACTIC ACID, PLASMA - Abnormal; Notable for the following components:   Lactic Acid, Venous 2.7 (*)    All other components within normal limits  LACTIC ACID, PLASMA - Abnormal; Notable for the following components:   Lactic Acid, Venous 2.2 (*)    All other components within normal limits  AMMONIA - Abnormal; Notable for the following components:   Ammonia 41 (*)    All other components within normal limits  PROTIME-INR - Abnormal; Notable for the following components:   Prothrombin Time 17.2 (*)    INR 1.4 (*)    All other components within normal limits  APTT - Abnormal; Notable for the following components:   aPTT 40 (*)    All other components within normal limits  COMPREHENSIVE METABOLIC PANEL - Abnormal; Notable for the following components:   Sodium 134 (*)    Chloride 92 (*)    Glucose,  Bld 156 (*)    Creatinine, Ser 3.82 (*)    Calcium 8.4 (*)    Total Bilirubin 1.4 (*)    GFR calc non Af Amer 16 (*)    GFR calc Af Amer 19 (*)    All other components within normal limits  SARS CORONAVIRUS 2 (TAT 6-24 HRS)  CULTURE, BLOOD (ROUTINE X 2)  CULTURE, BLOOD (ROUTINE X 2)  MAGNESIUM  I-STAT CHEM 8, ED  I-STAT VENOUS BLOOD GAS, ED  CBG MONITORING, ED  I-STAT ARTERIAL BLOOD GAS, ED  TROPONIN I (HIGH SENSITIVITY)  TROPONIN I (HIGH SENSITIVITY)    EKG EKG Interpretation  Date/Time:  Monday May 25 2019 11:17:38 EST Ventricular Rate:  57 PR Interval:    QRS Duration: 64 QT Interval:  529 QTC Calculation: 516 R Axis:   113 Text Interpretation: Right and left arm electrode reversal, interpretation assumes no reversal Sinus or ectopic atrial rhythm Ventricular premature complex Probable lateral infarct, age indeterminate Anteroseptal infarct, age indeterminate Prolonged QT interval No acute changes Confirmed by Varney Biles [phone]) on 05/25/2019 1:45:19 PM   Radiology Ct Head Wo Contrast  Result Date: 05/25/2019 CLINICAL DATA:  Altered level of consciousness. EXAM: CT HEAD WITHOUT CONTRAST TECHNIQUE: Contiguous axial images were obtained from the base of the skull through the vertex without intravenous contrast. COMPARISON:  October 26, 2010. FINDINGS: Brain: Mild chronic ischemic white matter disease is noted. No mass effect or midline shift is noted. Ventricular size is within normal limits. There is no evidence of mass lesion, hemorrhage or acute infarction. Vascular: No hyperdense vessel or unexpected calcification. Skull: Normal. Negative for fracture or focal lesion. Sinuses/Orbits: No acute finding. Other: None. IMPRESSION: Mild chronic ischemic white matter disease. No acute intracranial abnormality seen. Electronically Signed   By: Marijo Conception M.D.   On: 05/25/2019 12:38   Dg Chest [facility]  Result Date: 05/25/2019 CLINICAL DATA:  Short of breath.   Dialysis patient EXAM: PORTABLE CHEST 1 VIEW COMPARISON:  None. FINDINGS: Large-bore central venous line with tip in the RIGHT atrium. Cardiac silhouette grossly normal. Resuscitation pads overlie the mediastinum. There is a large RIGHT pleural effusion which is new from prior. Central venous congestion noted. potential RIGHT lower lobe consolidation. IMPRESSION: 1. New large RIGHT pleural effusion. 2. Central venous congestion. 3. Potential RIGHT lower lobe consolidation. 4. No pneumothorax. Electronically Signed   By: Suzy Bouchard M.D.   On: 05/25/2019 13:03    Procedures .Critical Care Performed by: Varney Biles, MD Authorized by: Varney Biles, MD   Critical care provider statement:    Critical care time (minutes):  108   Critical care was necessary to treat or prevent imminent or life-threatening deterioration of the following conditions:  CNS failure or compromise, respiratory failure, cardiac failure and circulatory failure   Critical care was time spent personally by me on the following activities:  Discussions with consultants, evaluation of patient's response to treatment, examination of patient, ordering and performing treatments and interventions, ordering and review of laboratory studies, ordering and review of radiographic studies, pulse oximetry, re-evaluation of patient's condition, obtaining history from patient or surrogate, review of old charts and transcutaneous pacing   I assumed direction of critical care for this patient from another provider in my specialty: yes   Temporary pacer  Date/Time: 05/25/2019 2:08 PM Performed by: Varney Biles, MD Authorized by: Varney Biles, MD  Consent: The procedure was performed in an emergent situation. Verbal consent obtained. Consent given by: patient Patient understanding: patient states understanding of the procedure being performed Patient identity confirmed: arm band Local anesthesia used: no  Anesthesia: Local  anesthesia used: no  Sedation: Patient sedated: no  Patient tolerance: patient tolerated the procedure well with no immediate complications  Ultrasound ED Echo  Date/Time: 05/25/2019 2:17 PM Performed by: Varney Biles, MD Authorized by: Varney Biles, MD   Procedure details:    Indications: hypotension     Views: subxiphoid     Images: archived     Limitations:  Body habitus, positioning and patient compliance Findings:    Pericardium: small pericardial effusion     LV Function: depressed (30 - 50%)     IVC: dilated   Impression:    Impression: pericardial effusion present and probable elevated CVP      Angiocath insertion Performed by: Harlee Pursifull  Consent: Verbal consent obtained. Risks and benefits: risks, benefits and alternatives were discussed Time out: Immediately prior to procedure a "time out" was called to verify the correct patient, procedure, equipment, support staff and site/side marked as required.  Preparation: Patient was prepped and draped in the usual sterile fashion.  Vein Location: right external jugular  Ultrasound Guided: no  Gauge: 18 g  Normal blood return and flush without difficulty Patient tolerance: Patient tolerated the procedure well with no immediate complications.    (including critical care time)  Medications Ordered in ED Medications  cefTRIAXone (ROCEPHIN) 2 g in sodium chloride 0.9 % 100 mL IVPB (has no administration in time range)  doxycycline (VIBRAMYCIN) 100 mg in sodium chloride 0.9 % 250 mL IVPB (has no administration in time range)  calcium gluconate inj 10% (1 g) URGENT USE ONLY! (1 g Intravenous Given 05/25/19 1122)  sodium bicarbonate injection 50 mEq (50 mEq Intravenous Given 05/25/19 1123)     Initial Impression / Assessment and Plan / ED Course  I have reviewed the  triage vital signs and the nursing notes.  Pertinent labs & imaging results that were available during my care of the patient were  reviewed by me and considered in my medical decision making (see chart for details).  Clinical Course as of May 24 1417  Mon May 25, 2019  1408 Potassium is 4.1.  Patient had received 1 amp of bicarb, and 1 g of calcium gluconate.  Potassium: 4.1 [AN]  1408 Transcutaneous pacing has been discontinued.  Patient is in sinus bradycardia.  I discussed the case with Dr. Marlou Porch, he will put in a low blur but at this time from bradycardia-cardiac arrhythmia perspective patient is stable.  Pulse Rate(!): 59 [AN]  1409 Dr. Moshe Cipro, nephrology is aware of the patient.  Results of the ED work-up discussed with the patient's wife. Neuro consulted.  Dr. Cheral Marker informed me over the phone that we should get an MRI brain without contrast which if positive then medicine can consult neuro.   [AN]  1410 X-ray shows right-sided pleural effusion.  Questionable sepsis. Antibiotics initiated. Nursing staff informed to start antibiotics before blood cultures if needed.  DG Chest Port 1 View [AN]  [phone] I had consulted medicine for stepdown ICU.  However patient's ABG does reveal that he is in hypercapnic respiratory failure with PCO2 of 68.  He will be started on BiPAP.  I will discuss the case with CCM.  I-Stat arterial blood gas, ED [AN]    Clinical Course User Index [AN] Varney Biles, MD       DDx includes: ICH / Stroke ACS Questionable sepsis syndrome Encephalopathy  Electrolyte abnormality Sick sinus syndrome Drug overdose Metabolic disorders including thyroid disorders, adrenal insufficiency Hypercapnia / COPD Hypoxia  58 year old comes in a chief complaint of altered mental status and bradycardia. He was on an epi drip and pacemaker when he arrived. Epi drip was discontinued.  Transcutaneous pacing was continued.  EMS rhythm strip is consistent with bradycardia -but it was unclear to me whether it sinus bradycardia versus A-V dissociation.  Bedside ultrasound reveals poor EF, no  tamponade.  Initial potassium is normal.  He does have acidosis.  Nursing staff informed me that occasionally patient's heart rate will drop into 40s, but it appears that he bounces back into upper 50s.  After speaking with cardiology team we have discontinued the pacing.  Patient continues to be somnolent.  He could have had a stroke, if so, he was last normal last night.  Patient also could have had a subclinical seizure versus sick sinus syndrome that is not being picked up in our telemetry leading to global amnesia.  X-ray is concerning for pleural effusion.   CODE STATUS discussed with wife.  She reports that patient is DNR and he would not want to be on a ventilator long-term.  Final Clinical Impressions(s) / ED Diagnoses   Final diagnoses:  Somnolence  ESRD (end stage renal disease) on dialysis [facility])  Bradycardia    ED Discharge Orders    None       Varney Biles, MD 05/25/19 [place], [place], MD 05/26/19 [phone]

## 2019-05-25 NOTE — ED Notes (Signed)
Paced at 80 BPM 140 MAmp

## 2019-05-25 NOTE — Progress Notes (Signed)
Pharmacy Antibiotic Note  Shawn [name] is a 58 y.o. male admitted on 05/25/2019 with pneumonia.  Pharmacy has been consulted for Zosyn and Vancomycin dosing.  Height: 6' (182.9 cm) Weight: 233 lb 11 oz (106 kg) IBW/kg (Calculated) : 77.6  Temp (24hrs), Avg:96.9 F (36.1 C), Min:96.9 F (36.1 C), Max:96.9 F (36.1 C)  Recent Labs  Lab 05/25/19 1109 05/25/19 1115 05/25/19 1136 05/25/19 1216 05/25/19 1217  WBC  --  6.2  --   --   --   CREATININE  --   --  3.20* 3.82*  --   LATICACIDVEN 2.7*  --   --   --  2.2*    Estimated Creatinine Clearance: 26.5 mL/min (A) (by C-G formula based on SCr of 3.82 mg/dL (H)).    No Known Allergies   Assessment:  Patient is a 26 yom with a Hx of ESRD on HD (MWF). The patient had a syncopal event during HD and went bradycardic and hypotensive. Patient received a thoracentesis in the ED in which a significant amount of puss was removed. Pharmacy has been asked to dose abx for PNA in this patient.  Antimicrobials this admission: 11/16 Zosyn >>  11/16 Vancomycin >>   Dose adjustments this admission: N/a  Microbiology results: 11/16 BCx: Pending  Plan: Patient has ESRD on HD MWF and received a full session today(11/16). - Zosyn 2.25g IV q8hr  - Vancomycin loading dose of 2500 mg IV x 1 dose  - Will continue with Vancomycin 1000 mg IV after HD (MWF)  - Monitor patient's HD sessions to ensure patient receives full length sessions and adjust as needed.   Thank you for allowing pharmacy to be a part of this patient's care.  Duanne Limerick PharmD. BCPS  05/25/2019 3:37 PM

## 2019-05-25 NOTE — ED Notes (Signed)
Pt was removed from bipap per MD approval. Pt on 4L O2 via nasal cannula. Pt O2 sats 99%.

## 2019-05-25 NOTE — H&P (Signed)
NAME:  Shawn [name], MRN:  [id number], DOB:  1960-11-02, LOS: 0 ADMISSION DATE:  05/25/2019, CONSULTATION DATE:  05/25/19 REFERRING MD:  ER, CHIEF COMPLAINT:  AMS   Brief History   58 year old man with hx of HD dependent ESRD presenting with syncopal episode in HD.  History of present illness   58 year old man with hx of HD dependent ESRD presenting with syncopal episode in HD.  Noted to be bradycardic, hypoatnsive brought to ER externally paced.  Seen in ER, pacing turned off and Bps stabilized.  Noted to be hypoxemic and hypercarbic.  PCCM asked to admit.  Past Medical History  CVA DM HTN ESRD Blind  Significant Hospital Events   11/16 Admitted  Consults:  Cardiology  Procedures:  11/16 R thoracentesis  Significant Diagnostic Tests:  CXR bilateral effusions, R>L  Micro Data:  Blood cx 11/16>> COVID>>  Antimicrobials:  Vanc 11/16 >> Zosyn 11/16>>  Interim history/subjective:  Sleepy, breathing a bit easier with BIPAP  Objective   Blood pressure 132/78, pulse (!) 59, temperature (!) 96.9 F (36.1 C), temperature source Tympanic, resp. rate 12, height 6' (1.829 m), weight 106 kg, SpO2 97 %.    Vent Mode: BIPAP FiO2 (%):  [60 %] 60 % Set Rate:  [15 bmp] 15 bmp PEEP:  [6 cmH20] 6 cmH20   Intake/Output Summary (Last 24 hours) at 05/25/2019 1517 Last data filed at 05/25/2019 1503 Gross per 24 hour  Intake 100 ml  Output -  Net 100 ml   Filed Weights   05/25/19 1136  Weight: 106 kg    Examination: General: Chronically ill man in NAD HENT: BIPAP in place with poor seal Lungs: Poor air movement bilaterally, massive R effusion on [place] Cardiovascular: RRR, ext warm Abdomen: Soft, +BS Extremities: Diffuse anasarca Neuro: Moves all 4 ext to command, globally weak PSYCH: RASS 0 Skin: bruising on RUE, full ROM, states does not feel broken  Resolved Hospital Problem list   NA  Assessment & Plan:  # Acute hypercarbic respiratory failure # Volume  overloaded state of heart # Milky R pleural effusion question of infection/malignancy/chylothorax # ESRD on HD # Shock-like episode in HD, vasoplegia vs. Chronotropic insufficiency with question of sepsis  - Hold all antihypertensives - Vanc/zosyn, f/u pleural and blood cultures - Start midodrine - BIPAP, recheck blood gas in a few hours - f/u COVID test - no immediate need for HD at present - Guarded prognosis  Best practice:  Diet: NPO Pain/Anxiety/Delirium protocol (if indicated): None VAP protocol (if indicated): None DVT prophylaxis: heparin3 GI prophylaxis: PPI Glucose control: SSI Mobility: BR Code Status: Full Family Communication: Will reach out Disposition:  ICU  Labs   CBC: Recent Labs  Lab 05/25/19 1115 05/25/19 1125 05/25/19 1136 05/25/19 1400  WBC 6.2  --   --   --   NEUTROABS 4.8  --   --   --   HGB 10.1* 18.4* 18.0* 17.3*  HCT 34.2* 54.0* 53.0* 51.0  MCV 100.9*  --   --   --   PLT 146*  --   --   --     Basic Metabolic Panel: Recent Labs  Lab 05/25/19 1125 05/25/19 1136 05/25/19 1216 05/25/19 1400  NA 124* 135 134* 133*  K >8.5* 4.0 4.1 4.3  CL  --  96* 92*  --   CO2  --   --  27  --   GLUCOSE  --  154* 156*  --   BUN  --  10 9  --   CREATININE  --  3.20* 3.82*  --   CALCIUM  --   --  8.4*  --   MG  --   --  2.4  --    GFR: Estimated Creatinine Clearance: 26.5 mL/min (A) (by C-G formula based on SCr of 3.82 mg/dL (H)). Recent Labs  Lab 05/25/19 1109 05/25/19 1115 05/25/19 1217  WBC  --  6.2  --   LATICACIDVEN 2.7*  --  2.2*    Liver Function Tests: Recent Labs  Lab 05/25/19 1216  AST 23  ALT 15  ALKPHOS 103  BILITOT 1.4*  PROT 7.1  ALBUMIN 3.5   No results for input(s): LIPASE, AMYLASE in the last 168 hours. Recent Labs  Lab 05/25/19 1217  AMMONIA 41*    ABG    Component Value Date/Time   PHART 7.205 (L) 05/25/2019 1400   PCO2ART 68.5 (HH) 05/25/2019 1400   PO2ART 85.0 05/25/2019 1400   HCO3 27.1 05/25/2019  1400   TCO2 29 05/25/2019 1400   ACIDBASEDEF 3.0 (H) 05/25/2019 1400   O2SAT 93.0 05/25/2019 1400     Coagulation Profile: Recent Labs  Lab 05/25/19 1216  INR 1.4*    Cardiac Enzymes: No results for input(s): CKTOTAL, CKMB, CKMBINDEX, TROPONINI in the last 168 hours.  HbA1C: Hgb A1c MFr Bld  Date/Time Value Ref Range Status  12/21/2017 05:14 PM 7.5 (H) 4.8 - 5.6 % Final    Comment:    (NOTE) Pre diabetes:          5.7%-6.4% Diabetes:              >6.4% Glycemic control for   <7.0% adults with diabetes   01/23/2016 11:51 AM 8.3 (H) 4.8 - 5.6 % Final    Comment:    (NOTE)         Pre-diabetes: 5.7 - 6.4         Diabetes: >6.4         Glycemic control for adults with diabetes: <7.0     CBG: Recent Labs  Lab 05/25/19 1110  GLUCAP 144*    Review of Systems:    Positive Symptoms in bold:  Constitutional fevers, chills, weight loss, fatigue, anorexia, malaise  Eyes decreased vision, double vision, eye irritation  Ears, Nose, Mouth, Throat sore throat, trouble swallowing, sinus congestion  Cardiovascular chest pain, paroxysmal nocturnal dyspnea, lower ext edema, palpitations   Respiratory SOB, cough, DOE, hemoptysis, wheezing  Gastrointestinal nausea, vomiting, diarrhea  Genitourinary burning with urination, trouble urinating  Musculoskeletal joint aches, joint swelling, back pain  Integumentary  rashes, skin lesions  Neurological focal weakness, focal numbness, trouble speaking, headaches  Psychiatric depression, anxiety, confusion  Endocrine polyuria, polydipsia, cold intolerance, heat intolerance  Hematologic abnormal bruising, abnormal bleeding, unexplained nose bleeds  Allergic/Immunologic recurrent infections, hives, swollen lymph nodes     Past Medical History  He,  has a past medical history of Anemia, Arthritis, Blind right eye, Chronic kidney disease, Complication of anesthesia, Diabetes mellitus, Hypertension, Legally blind, No pertinent past  medical history, PONV (postoperative nausea and vomiting), Shortness of breath dyspnea, Stroke ([place]) (2012), Syncope, and Unspecified cerebral artery occlusion with cerebral infarction (04/02/2013).   Surgical History    Past Surgical History:  Procedure Laterality Date  . AV FISTULA PLACEMENT Right 05/12/2013   Procedure: INSERTION OF ARTERIOVENOUS (AV) GORE-TEX GRAFT ARM; ULTRASOUND GUIDED;  Surgeon: Conrad Fisher, MD;  Location: [facility];  Service: Vascular;  Laterality: Right;  . AV  FISTULA PLACEMENT Left 01/18/2015   Procedure: INSERTION OF ARTERIOVENOUS GORE-TEX GRAFT LEFT UPPER ARM;  Surgeon: Conrad Schlater, MD;  Location: [facility];  Service: Vascular;  Laterality: Left;  . COLONOSCOPY  06/2012  . EYE SURGERY Bilateral    retina surgery both eyes, cataract surgery both eyes  . HERNIA REPAIR     right inguinal  . INSERTION OF DIALYSIS CATHETER Right   . left arm graft  10/2010  . LIGATION ARTERIOVENOUS GORTEX GRAFT Left 05/03/2015   Procedure: LIGATION ARTERIOVENOUS GORTEX GRAFT-LEFT UPPER ARM;  Surgeon: Conrad Batavia, MD;  Location: [facility];  Service: Vascular;  Laterality: Left;     Social History   reports that he has never smoked. He has never used smokeless tobacco. He reports that he does not drink alcohol or use drugs.   Family History   His family history includes Alzheimer's disease in his mother; Cancer in his father and sister; Diabetes in his mother; Heart disease in his father; Stroke in his sister.   Allergies No Known Allergies   Home Medications  Prior to Admission medications   Medication Sig Start Date End Date Taking? Authorizing Provider  amLODipine (NORVASC) 10 MG tablet Take 10 mg by mouth at bedtime.   Yes [provider]  brimonidine (ALPHAGAN) 0.2 % ophthalmic solution Place 1 drop into the left eye 2 (two) times daily.  11/19/18  Yes [provider]  insulin detemir (LEVEMIR) 100 UNIT/ML injection Inject 15 Units into the skin daily after breakfast.     Yes [provider]  lanthanum (FOSRENOL) 1000 MG chewable tablet Chew 1,000 mg by mouth 3 (three) times daily with meals.   Yes [provider]  latanoprost (XALATAN) 0.005 % ophthalmic solution Place 1 drop into the left eye at bedtime.   Yes [provider]  lisinopril (PRINIVIL,ZESTRIL) 10 MG tablet Take 10 mg by mouth daily.  06/11/18  Yes [provider]  metoprolol tartrate (LOPRESSOR) 25 MG tablet Take 25 mg by mouth 2 (two) times daily. 12/25/17  Yes [provider]  aspirin EC 325 MG tablet Take 325 mg by mouth at bedtime.    [provider]  atorvastatin (LIPITOR) 40 MG tablet Take 1 tablet (40 mg total) by mouth daily. Patient not taking: Reported on 05/25/2019 04/29/18 05/05/19  Buford Dresser, MD  b complex vitamins tablet Take 1 tablet by mouth at bedtime.    [provider]  diphenhydramine-acetaminophen (TYLENOL PM) 25-500 MG TABS tablet Take 1 tablet by mouth at bedtime as needed (pain).    [provider]  dorzolamide-timolol (COSOPT) 22.3-6.8 MG/ML ophthalmic solution Place 1 drop into the left eye 2 (two) times daily.    [provider]  erythromycin ophthalmic ointment Place 1 application into the left eye at bedtime as needed (dry eyes/itching).  12/12/17   [provider]     Critical care time: 33 minutes

## 2019-05-25 NOTE — ED Notes (Signed)
Pt alert, able to answer questions at this time, oriented x4.

## 2019-05-25 NOTE — Progress Notes (Signed)
Responded to page from ED.  Arrived to find Shawn [name] being cared for by medical staff.  There being no family present chaplain departed; stand ready to return if family arrives and if Shawn [name] needs spiritual care.  De Burrs Chaplain Resident

## 2019-05-25 NOTE — Progress Notes (Signed)
Got a call that patient is here in the ER.  Was able to confirm that he completed his HD this AM prior to the event that brought him here.  He was noted to be bradycardic and his beta blocker has now been stopped.   Renal will follow at a distance.  He will be due for dialysis again on Wednesday.   If is still an inpatient at that time will do formal consult.  Call with any questions   Louis Meckel

## 2019-05-25 NOTE — ED Notes (Addendum)
Patient transported to CT with SWOT RN Alyse Low

## 2019-05-25 NOTE — Consult Note (Signed)
Cardiology Consultation:   Patient ID: Shawn [name] MRN: [id number]; DOB: August 24, 1960  Admit date: 05/25/2019 Date of Consult: 05/25/2019  Primary Care Provider: Vincente Liberty, MD Primary Cardiologist: Buford Dresser, MD  Primary Electrophysiologist:  None    Patient Profile:   Shawn [name] is a 58 y.o. male with a hx of end-stage renal disease with hypotension on dialysis who is being seen today for the evaluation of loss of consciousness, possible bradycardia at the request of Dr. Kathrynn Humble.  History of Present Illness:   Mr. [name] 58 year old male with end-stage renal disease on hemodialysis with diabetes hypertension hyperlipidemia and prior TIA who earlier today while during a hemodialysis session which he reportedly completed had some loss of consciousness, altered mental status.  He was hypotensive.  Palpable pulse was thought to be slow.  EMS arrived, because of difficulty palpating pulse, external pacing was started.  Here in the emergency department, he continued with external pacing.  EKG demonstrates what looks like sinus bradycardia with first-degree AV block.  Heart rate 58 bpm.  I was able to successfully turn off the external pacing and continue to monitor him.  He has a stable underlying rhythm.  I was able to carefully review the telemetry monitoring, surely there was quite a bit of external pacing artifact present however I do not see any true evidence of AV block or significant bradycardia present.  EMS strip also was evaluated and looks like sinus bradycardia albeit small amplitude P waves with heart rate of about 50 originally.  Currently he is not complaining of any chest pain or shortness of breath.  He seems to be somewhat fatigued/somnolent.  Per recent telemedicine visit with Dr. Harrell Gave, he has been reporting that blood pressure dialysis has been running low sometimes 70's.  He sometimes has short windedness when climbing stairs  or walking long distance.  Notices it more when he is wearing a mask.  Denied any PND orthopnea.  Difficulty sleeping.  He has been working with his primary care doctor to help with sleep aids.  He is legally blind.  Heart Pathway Score:     Past Medical History:  Diagnosis Date  . Anemia   . Arthritis    patient denies  . Blind right eye   . Chronic kidney disease    Dialysis since 2011 MWF  . Complication of anesthesia   . Diabetes mellitus    type II  . Hypertension   . Legally blind   . No pertinent past medical history   . PONV (postoperative nausea and vomiting)    N/V- became dehydrated had to have IV fluids- 01/2015  . Shortness of breath dyspnea    when has too fluid  . Stroke [facility]) 2012   date per patient  . Syncope   . 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 AV FISTULA PLACEMENT Right 05/12/2013   Procedure: INSERTION OF ARTERIOVENOUS (AV) GORE-TEX GRAFT ARM; ULTRASOUND GUIDED;  Surgeon: Conrad Larrabee, MD;  Location: [facility];  Service: Vascular;  Laterality: Right;  . AV FISTULA PLACEMENT Left 01/18/2015   Procedure: INSERTION OF ARTERIOVENOUS GORE-TEX GRAFT LEFT UPPER ARM;  Surgeon: Conrad Republic, MD;  Location: [facility];  Service: Vascular;  Laterality: Left;  . COLONOSCOPY  06/2012  . EYE SURGERY Bilateral    retina surgery both eyes, cataract surgery both eyes  . HERNIA REPAIR     right inguinal  . INSERTION  OF DIALYSIS CATHETER Right   . left arm graft  10/2010  . LIGATION ARTERIOVENOUS GORTEX GRAFT Left 05/03/2015   Procedure: LIGATION ARTERIOVENOUS GORTEX GRAFT-LEFT UPPER ARM;  Surgeon: Conrad Germantown, MD;  Location: [facility];  Service: Vascular;  Laterality: Left;     Home Medications:  Prior to Admission medications   Medication Sig Start Date End Date Taking? Authorizing Provider  amLODipine (NORVASC) 10 MG tablet Take 10 mg by mouth at bedtime.    [provider]   aspirin EC 325 MG tablet Take 325 mg by mouth at bedtime.    [provider]  atorvastatin (LIPITOR) 40 MG tablet Take 1 tablet (40 mg total) by mouth daily. 04/29/18 05/05/19  Buford Dresser, MD  b complex vitamins tablet Take 1 tablet by mouth at bedtime.    [provider]  brimonidine (ALPHAGAN) 0.2 % ophthalmic solution INSTILL 1 DROP INTO LEFT EYE TWICE DAILY 11/19/18   [provider]  diphenhydramine-acetaminophen (TYLENOL PM) 25-500 MG TABS tablet Take 1 tablet by mouth at bedtime as needed (pain).    [provider]  dorzolamide-timolol (COSOPT) 22.3-6.8 MG/ML ophthalmic solution Place 1 drop into the left eye 2 (two) times daily.    [provider]  erythromycin ophthalmic ointment Place 1 application into the left eye at bedtime as needed (dry eyes/itching).  12/12/17   [provider]  insulin detemir (LEVEMIR) 100 UNIT/ML injection Inject 15 Units into the skin 2 (two) times daily with a meal.     [provider]  lanthanum (FOSRENOL) 1000 MG chewable tablet Chew 1,000 mg by mouth 3 (three) times daily with meals.    [provider]  latanoprost (XALATAN) 0.005 % ophthalmic solution Place 1 drop into the left eye at bedtime.    [provider]  lisinopril (PRINIVIL,ZESTRIL) 10 MG tablet Take 10 mg by mouth daily.  06/11/18   [provider]  metoprolol tartrate (LOPRESSOR) 25 MG tablet Take 25 mg by mouth 2 (two) times daily. 12/25/17   [provider]  Soft Lens Products (REFRESH CONTACTS DROPS) SOLN Place 1 drop into the left eye 2 (two) times daily as needed (dry eyes).    [provider]  Vitamin D, Ergocalciferol, (DRISDOL) 50000 units CAPS capsule Take 50,000 Units by mouth 3 (three) times a week.     [provider]    Inpatient Medications: Scheduled Meds:  Continuous Infusions:  PRN Meds:   Allergies:   No Known Allergies  Social History:   Social  History   Socioeconomic History  . Marital status: Married    Spouse name: Not on file  . Number of children: 0  . Years of education: 12th  . Highest education level: Not on file  Occupational History    Employer: UNEMPLOYED  Social Needs  . Financial resource strain: Not on file  . Food insecurity    Worry: Not on file    Inability: Not on file  . Transportation needs    Medical: Not on file    Non-medical: Not on file  Tobacco Use  . Smoking status: Never Smoker  . Smokeless tobacco: Never Used  Substance and Sexual Activity  . Alcohol use: No    Alcohol/week: 0.0 standard drinks  . Drug use: No  . Sexual activity: Not on file  Lifestyle  . Physical activity    Days per week: Not on file    Minutes per session: Not on file  . Stress: Not  on file  Relationships  . Social Herbalist on phone: Not on file    Gets together: Not on file    Attends religious service: Not on file    Active member of club or organization: Not on file    Attends meetings of clubs or organizations: Not on file    Relationship status: Not on file  . Intimate partner violence    Fear of current or ex partner: Not on file    Emotionally abused: Not on file    Physically abused: Not on file    Forced sexual activity: Not on file  Other Topics Concern  . Not on file  Social History Narrative   Patient lives at home with wife.    Patient is disabled.    Patient has no children.    Patient has a 12 grade education.        Family History:    Family History  Problem Relation Age of Onset  . Diabetes Mother   . Alzheimer's disease Mother   . Cancer Father   . Heart disease Father   . Cancer Sister   . Stroke Sister      ROS:  Please see the history of present illness.   All other ROS reviewed and negative.     Physical Exam/Data:   Vitals:   05/25/19 1245 05/25/19 1247 05/25/19 1255 05/25/19 1301  BP: 110/72  118/68   Pulse: 75 80 (!) 59   Resp: (!) 31 19 17     SpO2: 98% 97% 95% 96%  Weight:      Height:       No intake or output data in the 24 hours ending 05/25/19 1305 Last 3 Weights 05/25/2019 04/29/2018 01/22/2018  Weight (lbs) 233 lb 11 oz 211 lb 214 lb  Weight (kg) 106 kg 95.709 kg 97.07 kg     Body mass index is 31.69 kg/m.  General:  Well nourished, well developed, in no acute distress HEENT: Legally blind Lymph: no adenopathy Neck: no JVD Endocrine:  No thryomegaly Vascular: No carotid bruits Cardiac: Regular bradycardia heart rate upper 50s S1, S2; RRR; no murmur  Lungs:  clear to auscultation bilaterally, no wheezing, rhonchi or rales  Abd: soft, nontender, no hepatomegaly  Ext: no edema Musculoskeletal:  No deformities, BUE and BLE strength normal and equal Skin: warm and dry  Neuro: Difficult to assess currently Psych: Fatigued  EKG:  The EKG was personally reviewed and demonstrates: Sinus bradycardia rate 58 bpm with subtle first-degree AV block no ischemic changes, unchanged from most recent EKG  Telemetry:  Telemetry was personally reviewed and demonstrates: External pacing noted originally, once stopped, underlying rhythm was sinus/sinus bradycardia in the upper 50s no pauses  Relevant CV Studies:  Echocardiogram 12/22/2017-EF 45%  Laboratory Data:  High Sensitivity Troponin:   Recent Labs  Lab 05/25/19 1115  TROPONINIHS 10     ChemistryNo results for input(s): NA, K, CL, CO2, GLUCOSE, BUN, CREATININE, CALCIUM, GFRNONAA, GFRAA, ANIONGAP in the last 168 hours.  No results for input(s): PROT, ALBUMIN, AST, ALT, ALKPHOS, BILITOT in the last 168 hours. Hematology Recent Labs  Lab 05/25/19 1115  WBC 6.2  RBC 3.39*  HGB 10.1*  HCT 34.2*  MCV 100.9*  MCH 29.8  MCHC 29.5*  RDW 20.0*  PLT 146*   BNPNo results for input(s): BNP, PROBNP in the last 168 hours.  DDimer No results for input(s): DDIMER in the last 168 hours.   Radiology/Studies:  Ct Head Wo Contrast  Result Date: 05/25/2019 CLINICAL DATA:   Altered level of consciousness. EXAM: CT HEAD WITHOUT CONTRAST TECHNIQUE: Contiguous axial images were obtained from the base of the skull through the vertex without intravenous contrast. COMPARISON:  October 26, 2010. FINDINGS: Brain: Mild chronic ischemic white matter disease is noted. No mass effect or midline shift is noted. Ventricular size is within normal limits. There is no evidence of mass lesion, hemorrhage or acute infarction. Vascular: No hyperdense vessel or unexpected calcification. Skull: Normal. Negative for fracture or focal lesion. Sinuses/Orbits: No acute finding. Other: None. IMPRESSION: Mild chronic ischemic white matter disease. No acute intracranial abnormality seen. Electronically Signed   By: Marijo Conception M.D.   On: 05/25/2019 12:38    Assessment and Plan:   Loss of consciousness/altered mental status post dialysis -Originally came in with external pacing.  It appears that original EMS strips as well as EKGs here do show sinus rhythm/sinus bradycardia as low as the 50s.  I was able to stop the external pacing and he demonstrates a stable sinus bradycardia at around 59 bpm.  No pauses.  No ectopy.  I carefully reviewed the telemetry monitoring and did not see any evidence of AV block, however there were several minutes of external pacing artifact noted. -Repeat potassium was normal at 4.  Interestingly, high-sensitivity troponin was 10.  -Would likely be a good idea to stop the metoprolol 25 mg twice a day.  See how he does without this regarding his palpitations for which she is being treated.  He has battled with hypotension in the past.  Unsure why he had such a severe episode today.  I am sure that his transient loss of consciousness was more from transient hypotension.  However he does currently demonstrate altered mental status even with normal blood pressure and normal heart rate.  Continue work-up per primary team.  History of stroke -Aspirin 81 mg.  End-stage renal  disease on hemodialysis with hypertension and diabetes -Per nephrology.  On amlodipine for BP.  Hyperlipidemia -On atorvastatin 40 mg.  Continue LDL 99.  Ideally LDL less than 70.  Unclear targets and end-stage renal disease.  Palpitations -This is why he was on low-dose metoprolol.  Very infrequent, manageable.  I would recommend stopping the metoprolol at this point.    We will go ahead and sign off.  Please let us know if we can be of further assistance.  He has seen Dr. Al Pimple in the past via telemedicine.        For questions or updates, please contact [place] Please consult [email or web address] for contact info under     Signed, Candee Furbish, MD  05/25/2019 1:05 PM

## 2019-05-26 ENCOUNTER — Inpatient Hospital Stay (HOSPITAL_COMMUNITY): Payer: Medicare Other

## 2019-05-26 LAB — RENAL FUNCTION PANEL
Albumin: 3.2 g/dL — ABNORMAL LOW (ref 3.5–5.0)
Anion gap: 17 — ABNORMAL HIGH (ref 5–15)
BUN: 18 mg/dL (ref 6–20)
CO2: 20 mmol/L — ABNORMAL LOW (ref 22–32)
Calcium: 8.7 mg/dL — ABNORMAL LOW (ref 8.9–10.3)
Chloride: 97 mmol/L — ABNORMAL LOW (ref 98–111)
Creatinine, Ser: 4.61 mg/dL — ABNORMAL HIGH (ref 0.61–1.24)
GFR calc Af Amer: 15 mL/min — ABNORMAL LOW (ref >60)
GFR calc non Af Amer: 13 mL/min — ABNORMAL LOW (ref >60)
Glucose, Bld: 109 mg/dL — ABNORMAL HIGH (ref 70–99)
Phosphorus: 5.4 mg/dL — ABNORMAL HIGH (ref 2.5–4.6)
Potassium: 4.9 mmol/L (ref 3.5–5.1)
Sodium: 134 mmol/L — ABNORMAL LOW (ref 135–145)

## 2019-05-26 LAB — TRIGLYCERIDES, BODY FLUIDS: Triglycerides, Fluid: 176 mg/dL

## 2019-05-26 LAB — POCT I-STAT EG7
Acid-base deficit: 5 mmol/L — ABNORMAL HIGH (ref 0.0–2.0)
Bicarbonate: 24.1 mmol/L (ref 20.0–28.0)
Calcium, Ion: 0.84 mmol/L — CL (ref 1.15–1.40)
HCT: 54 % — ABNORMAL HIGH (ref 39.0–52.0)
Hemoglobin: 18.4 g/dL — ABNORMAL HIGH (ref 13.0–17.0)
O2 Saturation: 94 %
Potassium: >8.5 mmol/L (ref 3.5–5.1)
Sodium: 124 mmol/L — ABNORMAL LOW (ref 135–145)
TCO2: 26 mmol/L (ref 22–32)
pCO2, Ven: 59.1 mmHg (ref 44.0–60.0)
pH, Ven: 7.219 — ABNORMAL LOW (ref 7.250–7.430)
pO2, Ven: 86 mmHg — ABNORMAL HIGH (ref 32.0–45.0)

## 2019-05-26 LAB — POCT I-STAT 7, (LYTES, BLD GAS, ICA,H+H)
Acid-base deficit: 4 mmol/L — ABNORMAL HIGH (ref 0.0–2.0)
Bicarbonate: 26 mmol/L (ref 20.0–28.0)
Calcium, Ion: 1.17 mmol/L (ref 1.15–1.40)
HCT: 49 % (ref 39.0–52.0)
Hemoglobin: 16.7 g/dL (ref 13.0–17.0)
O2 Saturation: 87 %
Patient temperature: 96.8
Potassium: 4.7 mmol/L (ref 3.5–5.1)
Sodium: 132 mmol/L — ABNORMAL LOW (ref 135–145)
TCO2: 28 mmol/L (ref 22–32)
pCO2 arterial: 61.7 mmHg — ABNORMAL HIGH (ref 32.0–48.0)
pH, Arterial: 7.228 — ABNORMAL LOW (ref 7.350–7.450)
pO2, Arterial: 60 mmHg — ABNORMAL LOW (ref 83.0–108.0)

## 2019-05-26 LAB — BASIC METABOLIC PANEL
Anion gap: 18 — ABNORMAL HIGH (ref 5–15)
BUN: 16 mg/dL (ref 6–20)
CO2: 23 mmol/L (ref 22–32)
Calcium: 8.7 mg/dL — ABNORMAL LOW (ref 8.9–10.3)
Chloride: 94 mmol/L — ABNORMAL LOW (ref 98–111)
Creatinine, Ser: 4.61 mg/dL — ABNORMAL HIGH (ref 0.61–1.24)
GFR calc Af Amer: 15 mL/min — ABNORMAL LOW (ref >60)
GFR calc non Af Amer: 13 mL/min — ABNORMAL LOW (ref >60)
Glucose, Bld: 146 mg/dL — ABNORMAL HIGH (ref 70–99)
Potassium: 5 mmol/L (ref 3.5–5.1)
Sodium: 135 mmol/L (ref 135–145)

## 2019-05-26 LAB — GLUCOSE, CAPILLARY
Glucose-Capillary: 141 mg/dL — ABNORMAL HIGH (ref 70–99)
Glucose-Capillary: 124 mg/dL — ABNORMAL HIGH (ref 70–99)
Glucose-Capillary: 106 mg/dL — ABNORMAL HIGH (ref 70–99)
Glucose-Capillary: 84 mg/dL (ref 70–99)
Glucose-Capillary: 106 mg/dL — ABNORMAL HIGH (ref 70–99)

## 2019-05-26 LAB — POCT ACTIVATED CLOTTING TIME
Activated Clotting Time: 131 seconds
Activated Clotting Time: 158 seconds
Activated Clotting Time: 164 seconds
Activated Clotting Time: 180 seconds
Activated Clotting Time: 180 seconds
Activated Clotting Time: 175 seconds
Activated Clotting Time: 180 seconds
Activated Clotting Time: 169 seconds
Activated Clotting Time: 191 seconds
Activated Clotting Time: 191 seconds

## 2019-05-26 LAB — HIV ANTIBODY (ROUTINE TESTING W REFLEX): HIV Screen 4th Generation wRfx: NONREACTIVE — AB

## 2019-05-26 LAB — MRSA PCR SCREENING: MRSA by PCR: NEGATIVE

## 2019-05-26 LAB — CBC
HCT: 47.4 % (ref 39.0–52.0)
Hemoglobin: 14.7 g/dL (ref 13.0–17.0)
MCH: 29.9 pg (ref 26.0–34.0)
MCHC: 31 g/dL (ref 30.0–36.0)
MCV: 96.5 fL (ref 80.0–100.0)
Platelets: 302 10*3/uL (ref 150–400)
RBC: 4.91 MIL/uL (ref 4.22–5.81)
RDW: 18 % — ABNORMAL HIGH (ref 11.5–15.5)
WBC: 12.4 10*3/uL — ABNORMAL HIGH (ref 4.0–10.5)
nRBC: 0 % (ref 0.0–0.2)

## 2019-05-26 LAB — PH, BODY FLUID: pH, Body Fluid: 7.8

## 2019-05-26 MED ORDER — DOCUSATE SODIUM 50 MG/5ML PO LIQD: 100.0000 mg | Freq: Two times a day (BID) | ORAL | Status: DC | PRN

## 2019-05-26 MED ORDER — PRISMASOL BGK 4/2.5 32-4-2.5 MEQ/L REPLACEMENT SOLN
Status: DC
  Administered 2019-05-26 – 2019-05-27 (×3): via INTRAVENOUS_CENTRAL
  Filled 2019-05-26 (×4): qty 5000

## 2019-05-26 MED ORDER — PIPERACILLIN-TAZOBACTAM 3.375 G IVPB 30 MIN
3.3750 g | Freq: Four times a day (QID) | INTRAVENOUS | Status: DC
  Administered 2019-05-26 – 2019-05-27 (×4): 3.375 g via INTRAVENOUS
  Filled 2019-05-26 (×5): qty 50

## 2019-05-26 MED ORDER — PRISMASOL BGK 4/2.5 32-4-2.5 MEQ/L REPLACEMENT SOLN
Status: DC
  Administered 2019-05-26 – 2019-05-27 (×3): via INTRAVENOUS_CENTRAL
  Filled 2019-05-26 (×5): qty 5000

## 2019-05-26 MED ORDER — POLYETHYLENE GLYCOL 3350 17 G PO PACK
17.0000 g | PACK | Freq: Every day | ORAL | Status: DC
  Administered 2019-05-26 – 2019-05-30 (×2): 17 g via ORAL
  Filled 2019-05-26 (×3): qty 1

## 2019-05-26 MED ORDER — PIPERACILLIN-TAZOBACTAM 3.375 G IVPB: 3.3750 g | Freq: Four times a day (QID) | INTRAVENOUS | Status: DC

## 2019-05-26 MED ORDER — SODIUM CHLORIDE 0.9 % IV SOLN
250.0000 mL | INTRAVENOUS | Status: DC
  Administered 2019-05-26 – 2019-05-27 (×2): 250 mL via INTRAVENOUS

## 2019-05-26 MED ORDER — HEPARIN BOLUS VIA INFUSION (CRRT)
1000.0000 [IU] | INTRAVENOUS | Status: DC | PRN
  Filled 2019-05-26: qty 1000

## 2019-05-26 MED ORDER — PRISMASOL BGK 4/2.5 32-4-2.5 MEQ/L IV SOLN
INTRAVENOUS | Status: DC
  Administered 2019-05-26 – 2019-05-28 (×13): via INTRAVENOUS_CENTRAL
  Filled 2019-05-26 (×20): qty 5000

## 2019-05-26 MED ORDER — HEPARIN SODIUM (PORCINE) 1000 UNIT/ML DIALYSIS
1000.0000 [IU] | INTRAMUSCULAR | Status: DC | PRN
  Administered 2019-05-28: 4000 [IU] via INTRAVENOUS_CENTRAL
  Filled 2019-05-26 (×2): qty 6
  Filled 2019-05-26: qty 4

## 2019-05-26 MED ORDER — HEPARIN (PORCINE) 2000 UNITS/L FOR CRRT
INTRAVENOUS_CENTRAL | Status: DC | PRN
  Filled 2019-05-26: qty 1000

## 2019-05-26 MED ORDER — SODIUM CHLORIDE 0.9 % IV SOLN
250.0000 [IU]/h | INTRAVENOUS | Status: DC
  Administered 2019-05-26: 13:00:00 1000 [IU]/h via INTRAVENOUS_CENTRAL
  Administered 2019-05-27: 18:00:00 700 [IU]/h via INTRAVENOUS_CENTRAL
  Administered 2019-05-27: 750 [IU]/h via INTRAVENOUS_CENTRAL
  Filled 2019-05-26 (×5): qty 2

## 2019-05-26 MED ORDER — NOREPINEPHRINE 4 MG/250ML-% IV SOLN
2.0000 ug/min | INTRAVENOUS | Status: DC
  Administered 2019-05-26: 15:00:00 4 ug/min via INTRAVENOUS
  Filled 2019-05-26: qty 250

## 2019-05-26 MED ORDER — POLYETHYLENE GLYCOL (MIRALAX) NICU SYRINGE 0.34GM/ML: 0.5000 g/kg | ORAL | Status: DC

## 2019-05-26 MED ORDER — CHLORHEXIDINE GLUCONATE CLOTH 2 % EX PADS
6.0000 | MEDICATED_PAD | Freq: Every day | CUTANEOUS | Status: DC
  Administered 2019-05-26 – 2019-06-01 (×5): 6 via TOPICAL

## 2019-05-26 MED ORDER — HEPARIN SOD (PORK) LOCK FLUSH 100 UNIT/ML IV SOLN
500.0000 [IU] | Freq: Once | INTRAVENOUS | Status: AC
  Administered 2019-05-26: 06:00:00 500 [IU] via INTRAVENOUS
  Filled 2019-05-26: qty 5

## 2019-05-26 NOTE — Progress Notes (Signed)
Pharmacy Antibiotic Note  Shawn [name] is a 58 y.o. male admitted on 05/25/2019 with  pneumonia Pharmacy has been consulted for antibiotic dosing.  Height: 6' (182.9 cm) Weight: 186 lb 1.1 oz (84.4 kg) IBW/kg (Calculated) : 77.6  Temp (24hrs), Avg:97.4 F (36.3 C), Min:96.9 F (36.1 C), Max:97.9 F (36.6 C)  Recent Labs  Lab 05/25/19 1109 05/25/19 1115 05/25/19 1136 05/25/19 1216 05/25/19 1217 05/26/19 0359 05/26/19 0749  WBC  --  6.2  --   --   --   --  12.4*  CREATININE  --   --  3.20* 3.82*  --  4.61*  --   LATICACIDVEN 2.7*  --   --   --  2.2*  --   --     Estimated Creatinine Clearance: 19.2 mL/min (A) (by C-G formula based on SCr of 4.61 mg/dL (H)).    No Known Allergies   Assessment:  Pt with ESRD MWF with last HD 11/16 during which he became syncopal and bradycardic. Pt received thoracentesis in ED with copious amounts of pus. Pt unable to tolerate HD and will be started on CRRT today.   Pt previously on vancomycin with MRSA PCR negative with no known risk factors to MRSA.  Antimicrobials this admission: 11/16 Zosyn >>  11/16 Vancomycin >> 11/17  Microbiology results: 11/16 BCx: Pending 11/16 MRSA pcr:  neg  Plan: Discontinue vancomycin.  Change to zosyn 3.375g IV q6h while patient on CRRT.   Thank you for allowing pharmacy to be a part of this patient's care.  Onnie Boer PharmD Candidate 05/26/2019 1:18 PM

## 2019-05-26 NOTE — Progress Notes (Addendum)
PCCM INTERVAL PROGRESS NOTE   ABG consistent with respiratory acidosis: will start BiPAP  Effusion transudate by lights, but not by appearance: Nephrology consulted for volume removal.   Hypotension: patient continues to [facility], but is a bit lethargic: will start peripherally dosed norepinephrine to facilitate CRRT.   MRSA swab negative: DC vancomcyin   Georgann Housekeeper, AGACNP-BC [place] for personal pager PCCM on call pager [phone]  05/26/2019 11:29 AM

## 2019-05-26 NOTE — Progress Notes (Signed)
eLink Physician-Brief Progress Note Patient Name: Shawn [name] DOB: Jan 16, 1961 MRN: [id number]   Date of Service  05/26/2019  HPI/Events of Note  Pt needs bowel regimen per RN  eICU Interventions  Orders for Colace and Miralax entered.        Kerry Kass Ogan 05/26/2019, 10:46 PM

## 2019-05-26 NOTE — Progress Notes (Signed)
Patient wearing oxygen set at 2lpm with Sp02=97-100%. Patient does not appear to be any distress at this time.

## 2019-05-26 NOTE — Consult Note (Addendum)
[facility] Renal Consultation Note  Indication for Consultation:  Management of ESRD/hemodialysis; anemia, hypertension/volume and secondary hyperparathyroidism  HPI: Shawn [name] is a 58 y.o. malewith ESRD (chronic [facility] since 2011,[facility]) [place] 10/2010, 03/2013 9,Blind in [place] admitted yesterday with Syncopal episode post Full HD tx yesterday .  Noted pre tx hypotensive with  only 2.1 kg >edw and HD done with No UF . Heart rate 54  pre hd . Initially was Asymptomatic post tx then became syncopal in  [place] waiting for transportation . In ER Bps stabilized.  Noted to be hypoxemic and hypercarbic.CXR  = Bilat   bilateral effusions, R>L with R Thoracentesis (Milky R pleural effusion question of infection/malignancy/chylothorax )  We are consulted for ESRD / Dialysis issues.  Thought to be on beta blocker as OP as well as maybe ACE and norvasc ?  All have been held but still hypotensive   Currently in MICU  On bipap , lethargic  But oriented ,reports "just being weak past weekend ", denies f/c/s/n/v/d/cough/ sob/or chest pain .    Past Medical History:  Diagnosis Date  . Anemia   . Arthritis    patient denies  . Blind right eye   . Chronic kidney disease    Dialysis since 2011 MWF  . Complication of anesthesia   . Diabetes mellitus    type II  . Hypertension   . Legally blind   . No pertinent past medical history   . PONV (postoperative nausea and vomiting)    N/V- became dehydrated had to have IV fluids- 01/2015  . Shortness of breath dyspnea    when has too fluid  . Stroke [facility]) 2012   date per patient  . Syncope   . 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 AV FISTULA PLACEMENT Right 05/12/2013   Procedure: INSERTION OF ARTERIOVENOUS (AV) GORE-TEX GRAFT ARM; ULTRASOUND GUIDED;  Surgeon: Conrad Estell Manor, MD;  Location: [place];  Service: Vascular;  Laterality:  Right;  . AV FISTULA PLACEMENT Left 01/18/2015   Procedure: INSERTION OF ARTERIOVENOUS GORE-TEX GRAFT LEFT UPPER ARM;  Surgeon: Conrad [place] River, MD;  Location: [facility];  Service: Vascular;  Laterality: Left;  . COLONOSCOPY  06/2012  . EYE SURGERY Bilateral    retina surgery both eyes, cataract surgery both eyes  . HERNIA REPAIR     right inguinal  . INSERTION OF DIALYSIS CATHETER Right   . left arm graft  10/2010  . LIGATION ARTERIOVENOUS GORTEX GRAFT Left 05/03/2015   Procedure: LIGATION ARTERIOVENOUS GORTEX GRAFT-LEFT UPPER ARM;  Surgeon: Conrad , MD;  Location: [facility];  Service: Vascular;  Laterality: Left;      Family History  Problem Relation Age of Onset  . Diabetes Mother   . Alzheimer's disease Mother   . Cancer Father   . Heart disease Father   . Cancer Sister   . Stroke Sister       reports that he has never smoked. He has never used smokeless tobacco. He reports that he does not drink alcohol or use drugs.  No Known Allergies  Prior to Admission medications   Medication Sig Start Date End Date Taking? Authorizing Provider  amLODipine (NORVASC) 10 MG tablet Take 10 mg by mouth at bedtime.   Yes [provider]  brimonidine (ALPHAGAN) 0.2 % ophthalmic solution Place 1 drop into the left eye 2 (two) times daily.  11/19/18  Yes [provider]  insulin detemir (LEVEMIR) 100 UNIT/ML injection Inject 15 Units into the skin daily after breakfast.    Yes [provider]  lanthanum (FOSRENOL) 1000 MG chewable tablet Chew 1,000 mg by mouth 3 (three) times daily with meals.   Yes [provider]  latanoprost (XALATAN) 0.005 % ophthalmic solution Place 1 drop into the left eye at bedtime.   Yes [provider]  lisinopril (PRINIVIL,ZESTRIL) 10 MG tablet Take 10 mg by mouth daily.  06/11/18  Yes [provider]  metoprolol tartrate (LOPRESSOR) 25 MG tablet Take 25 mg by mouth 2 (two) times daily. 12/25/17  Yes [provider]   aspirin EC 325 MG tablet Take 325 mg by mouth at bedtime.    [provider]  atorvastatin (LIPITOR) 40 MG tablet Take 1 tablet (40 mg total) by mouth daily. Patient not taking: Reported on 05/25/2019 04/29/18 05/05/19  Buford Dresser, MD  b complex vitamins tablet Take 1 tablet by mouth at bedtime.    [provider]  diphenhydramine-acetaminophen (TYLENOL PM) 25-500 MG TABS tablet Take 1 tablet by mouth at bedtime as needed (pain).    [provider]  dorzolamide-timolol (COSOPT) 22.3-6.8 MG/ML ophthalmic solution Place 1 drop into the left eye 2 (two) times daily.    [provider]  erythromycin ophthalmic ointment Place 1 application into the left eye at bedtime as needed (dry eyes/itching).  12/12/17   [provider]      Results for orders placed or performed during the hospital encounter of 05/25/19 (from the past 48 hour(s))  Lactic acid, plasma     Status: Abnormal   Collection Time: 05/25/19 11:09 AM  Result Value Ref Range   Lactic Acid, Venous 2.7 (HH) 0.5 - 1.9 mmol/L    Comment: CRITICAL RESULT CALLED TO, READ BACK BY AND VERIFIED WITH: NEWNAM,K RN @ [id number] 05/25/19 LEONARD,A Performed at [facility], [address] [place], [place] [address]   POC CBG, ED     Status: Abnormal   Collection Time: 05/25/19 11:10 AM  Result Value Ref Range   Glucose-Capillary 144 (H) 70 - 99 mg/dL   Comment 1 Notify RN    Comment 2 Document in Chart   CBC with Differential     Status: Abnormal   Collection Time: 05/25/19 11:15 AM  Result Value Ref Range   WBC 6.2 4.0 - 10.5 K/uL   RBC 3.39 (L) 4.22 - 5.81 MIL/uL   Hemoglobin 10.1 (L) 13.0 - 17.0 g/dL   HCT 34.2 (L) 39.0 - 52.0 %   MCV 100.9 (H) 80.0 - 100.0 fL   MCH 29.8 26.0 - 34.0 pg   MCHC 29.5 (L) 30.0 - 36.0 g/dL   RDW 20.0 (H) 11.5 - 15.5 %   Platelets 146 (L) 150 - 400 K/uL   nRBC 0.3 (H) 0.0 - 0.2 %   Neutrophils Relative % 77 %   Neutro Abs 4.8 1.7 - 7.7 K/uL    Lymphocytes Relative 9 %   Lymphs Abs 0.6 (L) 0.7 - 4.0 K/uL   Monocytes Relative 12 %   Monocytes Absolute 0.7 0.1 - 1.0 K/uL   Eosinophils Relative 1 %   Eosinophils Absolute 0.1 0.0 - 0.5 K/uL   Basophils Relative 0 %   Basophils Absolute 0.0 0.0 - 0.1 K/uL   Immature Granulocytes 1 %   Abs Immature Granulocytes 0.04 0.00 - 0.07 K/uL    Comment: Performed at [facility], [place] [address] [place], [place] [address]  Troponin I (High Sensitivity)     Status: None   Collection Time: 05/25/19 11:15 AM  Result Value Ref Range   Troponin I (High Sensitivity) 10 <18 ng/L    Comment: (NOTE) Elevated high sensitivity troponin I (hsTnI) values and significant  changes across serial measurements may suggest ACS but many other  chronic and acute conditions are known to elevate hsTnI results.  Refer to the "Links" section for chest pain algorithms and additional  guidance. Performed at [facility], [place] [address] [place], [place] [address]   Blood Culture (routine x 2)     Status: None (Preliminary result)   Collection Time: 05/25/19 11:24 AM   Specimen: BLOOD  Result Value Ref Range   Specimen Description BLOOD SITE NOT SPECIFIED    Special Requests      BOTTLES DRAWN AEROBIC AND ANAEROBIC Blood Culture adequate volume   Culture      NO GROWTH < 24 HOURS Performed at [facility], [place] [address] [place], [place] [address]    Report Status PENDING   POCT I-Stat EG7     Status: Abnormal   Collection Time: 05/25/19 11:25 AM  Result Value Ref Range   pH, Ven 7.219 (L) 7.250 - 7.430   pCO2, Ven 59.1 44.0 - 60.0 mmHg   pO2, Ven 86.0 (H) 32.0 - 45.0 mmHg   Bicarbonate 24.1 20.0 - 28.0 mmol/L   TCO2 26 22 - 32 mmol/L   O2 Saturation 94.0 %   Acid-base deficit 5.0 (H) 0.0 - 2.0 mmol/L   Sodium 124 (L) 135 - 145 mmol/L   Potassium >8.5 (HH) 3.5 - 5.1 mmol/L   Calcium, Ion 0.84 (LL) 1.15 - 1.40 mmol/L   HCT 54.0 (H) 39.0 - 52.0 %   Hemoglobin 18.4 (H) 13.0 - 17.0  g/dL   Patient temperature HIDE    Sample type VENOUS    Comment NOTIFIED PHYSICIAN   I-stat chem 8, ED (not at [facility] or [facility])     Status: Abnormal   Collection Time: 05/25/19 11:36 AM  Result Value Ref Range   Sodium 135 135 - 145 mmol/L   Potassium 4.0 3.5 - 5.1 mmol/L   Chloride 96 (L) 98 - 111 mmol/L   BUN 10 6 - 20 mg/dL   Creatinine, Ser 3.20 (H) 0.61 - 1.24 mg/dL   Glucose, Bld 154 (H) 70 - 99 mg/dL   Calcium, Ion 1.03 (L) 1.15 - 1.40 mmol/L   TCO2 23 22 - 32 mmol/L   Hemoglobin 18.0 (H) 13.0 - 17.0 g/dL   HCT 53.0 (H) 39.0 - 52.0 %  SARS CORONAVIRUS 2 (TAT 6-24 HRS) Nasopharyngeal Nasopharyngeal Swab     Status: None   Collection Time: 05/25/19 11:47 AM   Specimen: Nasopharyngeal Swab  Result Value Ref Range   SARS Coronavirus 2 NEGATIVE NEGATIVE    Comment: (NOTE) SARS-CoV-2 target nucleic acids are NOT DETECTED. The SARS-CoV-2 RNA is generally detectable in upper and lower respiratory specimens during the acute phase of infection. Negative results do not preclude SARS-CoV-2 infection, do not rule out co-infections with other pathogens, and should not be used as the sole basis for treatment or other patient management decisions. Negative results must be combined with clinical observations, patient history, and epidemiological information. The expected result is Negative. Fact Sheet for Patients: SugarRoll.be Fact Sheet for Healthcare Providers: https://www.woods-mathews.com/ This test is not yet approved or cleared by the [place]  States FDA and  has been authorized for detection and/or diagnosis of SARS-CoV-2 by FDA under an Emergency Use Authorization (EUA). This EUA will remain  in effect (meaning this test can be used) for the duration of the COVID-19 declaration under Section 56 4(b)(1) of the Act, 21 U.S.C. section 360bbb-3(b)(1), unless the authorization is terminated or revoked sooner. Performed at [place], [place] [address] [place], [place] [address]   Protime-INR     Status: Abnormal   Collection Time: 05/25/19 12:16 PM  Result Value Ref Range   Prothrombin Time 17.2 (H) 11.4 - 15.2 seconds   INR 1.4 (H) 0.8 - 1.2    Comment: (NOTE) INR goal varies based on device and disease states. Performed at [facility], [place]  [address] [place], [place] [address]   APTT     Status: Abnormal   Collection Time: 05/25/19 12:16 PM  Result Value Ref Range   aPTT 40 (H) 24 - 36 seconds    Comment:        IF BASELINE aPTT IS ELEVATED, SUGGEST PATIENT RISK ASSESSMENT BE USED TO DETERMINE APPROPRIATE ANTICOAGULANT THERAPY. Performed at [facility], [place] [address], [place], [place] [address]   Comprehensive metabolic panel     Status: Abnormal   Collection Time: 05/25/19 12:16 PM  Result Value Ref Range   Sodium 134 (L) 135 - 145 mmol/L   Potassium 4.1 3.5 - 5.1 mmol/L   Chloride 92 (L) 98 - 111 mmol/L   CO2 27 22 - 32 mmol/L   Glucose, Bld 156 (H) 70 - 99 mg/dL   BUN 9 6 - 20 mg/dL   Creatinine, Ser 3.82 (H) 0.61 - 1.24 mg/dL   Calcium 8.4 (L) 8.9 - 10.3 mg/dL   Total Protein 7.1 6.5 - 8.1 g/dL   Albumin 3.5 3.5 - 5.0 g/dL   AST 23 15 - 41 U/L   ALT 15 0 - 44 U/L   Alkaline Phosphatase 103 38 - 126 U/L   Total Bilirubin 1.4 (H) 0.3 - 1.2 mg/dL   GFR calc non Af Amer 16 (L) >60 mL/min   GFR calc Af Amer 19 (L) >60 mL/min   Anion gap 15 5 - 15    Comment: Performed at [facility], [place] [address] [place], [place] [address]  Magnesium     Status: None   Collection Time: 05/25/19 12:16 PM  Result Value Ref Range   Magnesium 2.4 1.7 - 2.4 mg/dL    Comment: Performed at [facility] [address], [place], [place] [address]  Lactic acid, plasma     Status: Abnormal   Collection Time: 05/25/19 12:17 PM  Result Value Ref Range   Lactic Acid, Venous 2.2 (HH) 0.5 - 1.9 mmol/L    Comment: CRITICAL VALUE NOTED.  VALUE IS CONSISTENT WITH PREVIOUSLY REPORTED AND CALLED  VALUE. Performed at [facility], [place] [address] [place], [place] [address]   Ammonia     Status: Abnormal   Collection Time: 05/25/19 12:17 PM  Result Value Ref Range   Ammonia 41 (H) 9 - 35 umol/L    Comment: Performed at [facility], [place] [address] [place], [place] [address]  I-STAT 7, (LYTES, BLD GAS, ICA, H+H)     Status: Abnormal   Collection Time: 05/25/19  2:00 PM  Result Value Ref Range   pH, Arterial 7.205 (L) 7.350 - 7.450   pCO2 arterial 68.5 (HH)  32.0 - 48.0 mmHg   pO2, Arterial 85.0 83.0 - 108.0 mmHg   Bicarbonate 27.1 20.0 - 28.0 mmol/L   TCO2 29 22 - 32 mmol/L   O2 Saturation 93.0 %   Acid-base deficit 3.0 (H) 0.0 - 2.0 mmol/L   Sodium 133 (L) 135 - 145 mmol/L   Potassium 4.3 3.5 - 5.1 mmol/L   Calcium, Ion 1.14 (L) 1.15 - 1.40 mmol/L   HCT 51.0 39.0 - 52.0 %   Hemoglobin 17.3 (H) 13.0 - 17.0 g/dL   Patient temperature HIDE    Collection site RADIAL, ALLEN'S TEST ACCEPTABLE    Sample type ARTERIAL    Comment NOTIFIED PHYSICIAN   Troponin I (High Sensitivity)     Status: None   Collection Time: 05/25/19  2:18 PM  Result Value Ref Range   Troponin I (High Sensitivity) 12 <18 ng/L    Comment: (NOTE) Elevated high sensitivity troponin I (hsTnI) values and significant  changes across serial measurements may suggest ACS but many other  chronic and acute conditions are known to elevate hsTnI results.  Refer to the "Links" section for chest pain algorithms and additional  guidance. Performed at [facility], [place] [address], [place], [place] [address]   Blood Culture (routine x 2)     Status: None (Preliminary result)   Collection Time: 05/25/19  2:18 PM   Specimen: BLOOD RIGHT HAND  Result Value Ref Range   Specimen Description BLOOD RIGHT HAND    Special Requests      BOTTLES DRAWN AEROBIC AND ANAEROBIC Blood Culture adequate volume   Culture      NO GROWTH < 24 HOURS Performed at [facility], [place] [address] [place], [place]  [address]    Report Status PENDING   Body fluid cell count with differential     Status: Abnormal   Collection Time: 05/25/19  4:53 PM  Result Value Ref Range   Fluid Type-FCT PLEURAL     Comment: FLUID   Color, Fluid YELLOW    Appearance, Fluid CLOUDY (A) CLEAR   Total Nucleated Cell Count, Fluid 64 0 - 1,000 cu mm   Neutrophil Count, Fluid 6 0 - 25 %   Lymphs, Fluid 37 %   Monocyte-Macrophage-Serous Fluid 57 50 - 90 %   Eos, Fluid 0 %    Comment: Performed at [facility] [address], [place], [place] [address]  Glucose, pleural or peritoneal fluid     Status: None   Collection Time: 05/25/19  4:53 PM  Result Value Ref Range   Glucose, Fluid 181 mg/dL    Comment: (NOTE) No normal range established for this test Results should be evaluated in conjunction with serum values    Fluid Type-FGLU PLEURAL     Comment: FLUID Performed at [facility] [address] [place], [place] [address]   Lactate dehydrogenase (pleural or peritoneal fluid)     Status: Abnormal   Collection Time: 05/25/19  4:53 PM  Result Value Ref Range   LD, Fluid 80 (H) 3 - 23 U/L    Comment: (NOTE) Results should be evaluated in conjunction with serum values    Fluid Type-FLDH PLEURAL     Comment: FLUID Performed at [facility] [address], [place], [place] [address]   Protein, pleural or peritoneal fluid     Status: None   Collection Time: 05/25/19  4:53 PM  Result Value Ref Range   Total protein,  fluid 4.4 g/dL    Comment: (NOTE) No normal range established for this test Results should be evaluated in conjunction with serum values    Fluid Type-FTP PLEURAL     Comment: FLUID Performed at [facility], [place] [address] [place], [place] [address]   CBG monitoring, ED     Status: Abnormal   Collection Time: 05/25/19  5:50 PM  Result Value Ref Range   Glucose-Capillary 155 (H) 70 - 99 mg/dL  Body fluid culture (includes gram stain)     Status: None (Preliminary  result)   Collection Time: 05/25/19  6:46 PM   Specimen: Pleural Fluid  Result Value Ref Range   Specimen Description PLEURAL    Special Requests NONE    Gram Stain      FEW WBC PRESENT,BOTH PMN AND MONONUCLEAR NO ORGANISMS SEEN    Culture      NO GROWTH < 24 HOURS Performed at [facility], [place] [address] [place], [place] [address]    Report Status PENDING   MRSA PCR Screening     Status: None   Collection Time: 05/26/19 12:06 AM   Specimen: Nasopharyngeal  Result Value Ref Range   MRSA by PCR NEGATIVE NEGATIVE    Comment:        The GeneXpert MRSA Assay (FDA approved for NASAL specimens only), is one component of a comprehensive MRSA colonization surveillance program. It is not intended to diagnose MRSA infection nor to guide or monitor treatment for MRSA infections. Performed at [facility], [place] [address] [place], [place] [address]   Glucose, capillary     Status: Abnormal   Collection Time: 05/26/19  3:31 AM  Result Value Ref Range   Glucose-Capillary 141 (H) 70 - 99 mg/dL  Basic metabolic panel     Status: Abnormal   Collection Time: 05/26/19  3:59 AM  Result Value Ref Range   Sodium 135 135 - 145 mmol/L   Potassium 5.0 3.5 - 5.1 mmol/L   Chloride 94 (L) 98 - 111 mmol/L   CO2 23 22 - 32 mmol/L   Glucose, Bld 146 (H) 70 - 99 mg/dL   BUN 16 6 - 20 mg/dL   Creatinine, Ser 4.61 (H) 0.61 - 1.24 mg/dL   Calcium 8.7 (L) 8.9 - 10.3 mg/dL   GFR calc non Af Amer 13 (L) >60 mL/min   GFR calc Af Amer 15 (L) >60 mL/min   Anion gap 18 (H) 5 - 15    Comment: Performed at [facility] [address] [place], [place] [address]  CBC     Status: Abnormal   Collection Time: 05/26/19  7:49 AM  Result Value Ref Range   WBC 12.4 (H) 4.0 - 10.5 K/uL   RBC 4.91 4.22 - 5.81 MIL/uL   Hemoglobin 14.7 13.0 - 17.0 g/dL    Comment: REPEATED TO VERIFY VERIFIED WITH NEW SAMPLE    HCT 47.4 39.0 - 52.0 %   MCV 96.5 80.0 - 100.0 fL   MCH 29.9 26.0 - 34.0 pg    MCHC 31.0 30.0 - 36.0 g/dL   RDW 18.0 (H) 11.5 - 15.5 %   Platelets 302 150 - 400 K/uL    Comment: REPEATED TO VERIFY VERIFIED WITH NEW SAMPLE    nRBC 0.0 0.0 - 0.2 %    Comment: Performed at [facility], [place] [address] [place], [place] [address]  Glucose, capillary     Status: Abnormal  Collection Time: 05/26/19  7:50 AM  Result Value Ref Range   Glucose-Capillary 124 (H) 70 - 99 mg/dL  I-STAT 7, (LYTES, BLD GAS, ICA, H+H)     Status: Abnormal   Collection Time: 05/26/19  8:58 AM  Result Value Ref Range   pH, Arterial 7.228 (L) 7.350 - 7.450   pCO2 arterial 61.7 (H) 32.0 - 48.0 mmHg   pO2, Arterial 60.0 (L) 83.0 - 108.0 mmHg   Bicarbonate 26.0 20.0 - 28.0 mmol/L   TCO2 28 22 - 32 mmol/L   O2 Saturation 87.0 %   Acid-base deficit 4.0 (H) 0.0 - 2.0 mmol/L   Sodium 132 (L) 135 - 145 mmol/L   Potassium 4.7 3.5 - 5.1 mmol/L   Calcium, Ion 1.17 1.15 - 1.40 mmol/L   HCT 49.0 39.0 - 52.0 %   Hemoglobin 16.7 13.0 - 17.0 g/dL   Patient temperature 96.8 F    Collection site RADIAL, ALLEN'S TEST ACCEPTABLE    Drawn by RT    Sample type ARTERIAL     ROS: see hpi  Physical Exam: Vitals:   05/26/19 0930 05/26/19 1000  BP: 102/61 91/60  Pulse: 64 64  Resp: 15 13  Temp:    SpO2: 97% 98%     General: Lethargic AAM, thin chronically ill appearing ,NAD HEENT: , BIPAP, Not icteric (blind) Neck: no jvd Heart: RRR, no rub, mur, gallop Lungs: Decreased BS bilat R >L (on bipap) Abdomen: BS pos normal active, soft, NT, ND, no ascites  Or mass appreciated  Extremities: no pedal edema Skin: Lower extrem  Old excoriations , no overt rash  Warm dry Neuro: lethargic  But oriented , moves all extrem , with slow movements  Dialysis Access: Perm cath no dc or tenderness at site  Dialysis Orders: Center: [facility]  on MWF . EDW 86.5 HD Bath 3k, 2ca  Time  4hr66min  Access L  IJ  Perm cath   Heparin 6000.  Calcitriol 0.94mcg po q hd    Venofer  50 mg  q hd    No ESA = Last OP HGB 13.9   05/20/19  Assessment/Plan 1. Acute Hypercarbic Respiratory Failure- per CCM now on bipap 2. R Pleural Refusion- sp Thoracentesis   question of infection/malignancy/chylothorax- transudative parameters so looking to see if can pull more volume off 3. ESRD -  HD  Normal MWF  /  K 4.7 but hypotensive needs crrt  4. Hypotension  With hypotensive episode pre hd and post hd - ? Sepsis Felipa Evener - definitely stop all antihypertensives 5. Anemia  Of ESRD - hgb 16.7 no esa needs (no esa as op , hold weekly  venofer with ?? Infection and ^ hgb )  6. Metabolic bone disease -  Po vit d on HD ,  Hold till Pos and binders  7. DM type 2 - per admit  8. Blind   Ernest Haber, PA-C [place] [phone] 05/26/2019, 10:56 AM   Patient seen and examined, agree with above note with above modifications. Chronically ill BM with ESRD presenting with hypotension and now lethargy requiring bipap.  Not a clear source of problem.  Reportedly on antihypertensives as OP- are holding but is volume overloaded with transudative pleural effusion so will ultrafiltrate.  Given his hypotension, the best route for significant volume removal would be CRRT-  Goal of 100 per hour removed  Corliss Parish, MD 05/26/2019

## 2019-05-26 NOTE — Progress Notes (Signed)
RT NOTE: RT obtained ABG per order and reported results to NP. Per NP, RT place patient on bipap. RT placed patient on bipap and patient is tolerating well and says that he is comfortable. RT will continue to monitor.

## 2019-05-26 NOTE — Progress Notes (Signed)
NAME:  Shawn [name], MRN:  [id number], DOB:  13-Jul-1960, LOS: 1 ADMISSION DATE:  05/25/2019, CONSULTATION DATE:  05/25/19 REFERRING MD:  ER, CHIEF COMPLAINT:  AMS   Brief History   58 year old man with hx of HD dependent ESRD presenting with syncopal episode in HD.  History of present illness   58 year old man with hx of HD dependent ESRD presenting with syncopal episode in HD.  Noted to be bradycardic, hypoatnsive brought to ER externally paced.  Seen in ER, pacing turned off and Bps stabilized.  Noted to be hypoxemic and hypercarbic.  PCCM asked to admit.  Past Medical History  CVA, DM, HTN, ESRD, [facility] Events   11/16 Admitted  Consults:  Cardiology  Procedures:  11/16 R thoracentesis  Significant Diagnostic Tests:  CXR bilateral effusions, R>L CT chest 11/17 > MRI brain 11/17 >  Micro Data:  Blood cx 11/16 > COVID >  Antimicrobials:  Vanc 11/16  > Zosyn 11/16 >  Interim history/subjective:  Lethargic this morning. No acute events overnight.   Objective   Blood pressure (!) 89/59, pulse 65, temperature (!) 96.9 F (36.1 C), temperature source Axillary, resp. rate 12, height 6' (1.829 m), weight 84.4 kg, SpO2 99 %.    Vent Mode: BIPAP FiO2 (%):  [60 %] 60 % Set Rate:  [15 bmp] 15 bmp PEEP:  [6 cmH20] 6 cmH20   Intake/Output Summary (Last 24 hours) at 05/26/2019 0802 Last data filed at 05/26/2019 0000 Gross per 24 hour  Intake 900 ml  Output 0 ml  Net 900 ml   Filed Weights   05/25/19 1136 05/26/19 0000 05/26/19 0127  Weight: 106 kg 84.4 kg 84.4 kg    Examination: General: frail middle aged male in NAD HENT: [place]/AT, PERRL, no JVD Lungs:Clear bilateral breath sounds Cardiovascular: RRR, no MRG Abdomen: Soft, non-tender, non-distended Extremities: Diffuse anasarca  Neuro: RASS -2. Arouses only briefly before falling back asleep. When awake he does follow commands.  Resolved Hospital Problem list   NA  Assessment &  Plan:  Acute hypercarbic respiratory failure: in the setting of large bilateral pleural effusions. Improved after thoracentesis on R. Now on 2L with adequate O2 sats.  - Supplemental O2 to keep sats > 92% - Incentive spirometry - BIPAP PRN  Bilateral pleural effusion R > L: s/p R thoracentesis 11/16. Brown milky appearance. Question of infection/malignancy/chylothorax. Exudate by light's. - Await cholesterol, triglycerides, cultures, cytology.  - Follow CXR and consider L sided tap if hypoxia worsens.  - Empiric antibiotics. Follow cultuers  ESRD on HD - Nephrology following - Daily BMP  Shock-like episode in HD, vasoplegia vs. Chronotropic insufficiency with question of sepsis. Remains borderline hypotensive.  - Hold all antihypertensives - Start midodrine  Acute metabolic encephalopathy: - Check ABG - MRI brain pending - ammonia only mildly elevated, unlikely source  Best practice:  Diet: Sips with meds for now Pain/Anxiety/Delirium protocol (if indicated): None VAP protocol (if inSQH GI prophylaxis: PPI Glucose control: SSI Mobility: BR Code Status: Full Family Communication: Family updated 11/17 Disposition:  ICU  Labs   CBC: Recent Labs  Lab 05/25/19 1115 05/25/19 1125 05/25/19 1136 05/25/19 1400  WBC 6.2  --   --   --   NEUTROABS 4.8  --   --   --   HGB 10.1* 18.4* 18.0* 17.3*  HCT 34.2* 54.0* 53.0* 51.0  MCV 100.9*  --   --   --   PLT 146*  --   --   --  Basic Metabolic Panel: Recent Labs  Lab 05/25/19 1125 05/25/19 1136 05/25/19 1216 05/25/19 1400 05/26/19 0359  NA 124* 135 134* 133* 135  K >8.5* 4.0 4.1 4.3 5.0  CL  --  96* 92*  --  94*  CO2  --   --  27  --  23  GLUCOSE  --  154* 156*  --  146*  BUN  --  10 9  --  16  CREATININE  --  3.20* 3.82*  --  4.61*  CALCIUM  --   --  8.4*  --  8.7*  MG  --   --  2.4  --   --    GFR: Estimated Creatinine Clearance: 19.2 mL/min (A) (by C-G formula based on SCr of 4.61 mg/dL (H)). Recent Labs   Lab 05/25/19 1109 05/25/19 1115 05/25/19 1217  WBC  --  6.2  --   LATICACIDVEN 2.7*  --  2.2*    Liver Function Tests: Recent Labs  Lab 05/25/19 1216  AST 23  ALT 15  ALKPHOS 103  BILITOT 1.4*  PROT 7.1  ALBUMIN 3.5   No results for input(s): LIPASE, AMYLASE in the last 168 hours. Recent Labs  Lab 05/25/19 1217  AMMONIA 41*    ABG    Component Value Date/Time   PHART 7.205 (L) 05/25/2019 1400   PCO2ART 68.5 (HH) 05/25/2019 1400   PO2ART 85.0 05/25/2019 1400   HCO3 27.1 05/25/2019 1400   TCO2 29 05/25/2019 1400   ACIDBASEDEF 3.0 (H) 05/25/2019 1400   O2SAT 93.0 05/25/2019 1400     Coagulation Profile: Recent Labs  Lab 05/25/19 1216  INR 1.4*    Cardiac Enzymes: No results for input(s): CKTOTAL, CKMB, CKMBINDEX, TROPONINI in the last 168 hours.  HbA1C: Hgb A1c MFr Bld  Date/Time Value Ref Range Status  12/21/2017 05:14 PM 7.5 (H) 4.8 - 5.6 % Final    Comment:    (NOTE) Pre diabetes:          5.7%-6.4% Diabetes:              >6.4% Glycemic control for   <7.0% adults with diabetes   01/23/2016 11:51 AM 8.3 (H) 4.8 - 5.6 % Final    Comment:    (NOTE)         Pre-diabetes: 5.7 - 6.4         Diabetes: >6.4         Glycemic control for adults with diabetes: <7.0     CBG: Recent Labs  Lab 05/25/19 1110 05/25/19 1750 05/26/19 0331 05/26/19 0750  GLUCAP 144* 155* 141* 124*    Review of Systems:    Positive Symptoms in bold:  Constitutional fevers, chills, weight loss, fatigue, anorexia, malaise  Eyes decreased vision, double vision, eye irritation  Ears, Nose, Mouth, Throat sore throat, trouble swallowing, sinus congestion  Cardiovascular chest pain, paroxysmal nocturnal dyspnea, lower ext edema, palpitations   Respiratory SOB, cough, DOE, hemoptysis, wheezing  Gastrointestinal nausea, vomiting, diarrhea  Genitourinary burning with urination, trouble urinating  Musculoskeletal joint aches, joint swelling, back pain  Integumentary   rashes, skin lesions  Neurological focal weakness, focal numbness, trouble speaking, headaches  Psychiatric depression, anxiety, confusion  Endocrine polyuria, polydipsia, cold intolerance, heat intolerance  Hematologic abnormal bruising, abnormal bleeding, unexplained nose bleeds  Allergic/Immunologic recurrent infections, hives, swollen lymph nodes     Past Medical History  He,  has a past medical history of Anemia, Arthritis, Blind right eye, Chronic kidney disease, Complication  of anesthesia, Diabetes mellitus, Hypertension, Legally blind, No pertinent past medical history, PONV (postoperative nausea and vomiting), Shortness of breath dyspnea, Stroke ([place]) (2012), Syncope, and Unspecified cerebral artery occlusion with cerebral infarction (04/02/2013).   Surgical History    Past Surgical History:  Procedure Laterality Date  . AV FISTULA PLACEMENT Right 05/12/2013   Procedure: INSERTION OF ARTERIOVENOUS (AV) GORE-TEX GRAFT ARM; ULTRASOUND GUIDED;  Surgeon: Conrad [place], MD;  Location: [place];  Service: Vascular;  Laterality: Right;  . AV FISTULA PLACEMENT Left 01/18/2015   Procedure: INSERTION OF ARTERIOVENOUS GORE-TEX GRAFT LEFT UPPER ARM;  Surgeon: Conrad [place], MD;  Location: [place];  Service: Vascular;  Laterality: Left;  . COLONOSCOPY  06/2012  . EYE SURGERY Bilateral    retina surgery both eyes, cataract surgery both eyes  . HERNIA REPAIR     right inguinal  . INSERTION OF DIALYSIS CATHETER Right   . left arm graft  10/2010  . LIGATION ARTERIOVENOUS GORTEX GRAFT Left 05/03/2015   Procedure: LIGATION ARTERIOVENOUS GORTEX GRAFT-LEFT UPPER ARM;  Surgeon: Conrad [place], MD;  Location: [place];  Service: Vascular;  Laterality: Left;     Social History   reports that he has never smoked. He has never used smokeless tobacco. He reports that he does not drink alcohol or use drugs.   Family History   His family history includes Alzheimer's disease in his mother; Cancer in his father and  sister; Diabetes in his mother; Heart disease in his father; Stroke in his sister.   Allergies No Known Allergies   Home Medications  Prior to Admission medications   Medication Sig Start Date End Date Taking? Authorizing Provider  amLODipine (NORVASC) 10 MG tablet Take 10 mg by mouth at bedtime.   Yes [provider]  brimonidine (ALPHAGAN) 0.2 % ophthalmic solution Place 1 drop into the left eye 2 (two) times daily.  11/19/18  Yes [provider]  insulin detemir (LEVEMIR) 100 UNIT/ML injection Inject 15 Units into the skin daily after breakfast.    Yes [provider]  lanthanum (FOSRENOL) 1000 MG chewable tablet Chew 1,000 mg by mouth 3 (three) times daily with meals.   Yes [provider]  latanoprost (XALATAN) 0.005 % ophthalmic solution Place 1 drop into the left eye at bedtime.   Yes [provider]  lisinopril (PRINIVIL,ZESTRIL) 10 MG tablet Take 10 mg by mouth daily.  06/11/18  Yes [provider]  metoprolol tartrate (LOPRESSOR) 25 MG tablet Take 25 mg by mouth 2 (two) times daily. 12/25/17  Yes [provider]  aspirin EC 325 MG tablet Take 325 mg by mouth at bedtime.    [provider]  atorvastatin (LIPITOR) 40 MG tablet Take 1 tablet (40 mg total) by mouth daily. Patient not taking: Reported on 05/25/2019 04/29/18 05/05/19  Buford Dresser, MD  b complex vitamins tablet Take 1 tablet by mouth at bedtime.    [provider]  diphenhydramine-acetaminophen (TYLENOL PM) 25-500 MG TABS tablet Take 1 tablet by mouth at bedtime as needed (pain).    [provider]  dorzolamide-timolol (COSOPT) 22.3-6.8 MG/ML ophthalmic solution Place 1 drop into the left eye 2 (two) times daily.    [provider]  erythromycin ophthalmic ointment Place 1 application into the left eye at bedtime as needed (dry eyes/itching).  12/12/17   [provider]     Critical care time:      Georgann Housekeeper, AGACNP-BC [place]  for personal pager PCCM on call pager [phone]  05/26/2019 8:22 AM

## 2019-05-26 NOTE — Progress Notes (Signed)
RT NOTE: RT spoke with NP about obtaining ABG and patient seems more awake and alert and had full conversations with RT. Per NP hold ABG and let patient have a break off bipap. Patient is currently on 3L [place] sating 98% and says his breathing is comfortable. RN and NP aware. RT will continue to monitor as needed.

## 2019-05-27 DIAGNOSIS — G9340 Encephalopathy, unspecified: Secondary | ICD-10-CM

## 2019-05-27 LAB — CBC WITH DIFFERENTIAL/PLATELET
Abs Immature Granulocytes: 0.06 10*3/uL (ref 0.00–0.07)
Basophils Absolute: 0 10*3/uL (ref 0.0–0.1)
Basophils Relative: 0 %
Eosinophils Absolute: 0 10*3/uL (ref 0.0–0.5)
Eosinophils Relative: 0 %
HCT: 45 % (ref 39.0–52.0)
Hemoglobin: 14.2 g/dL (ref 13.0–17.0)
Immature Granulocytes: 1 %
Lymphocytes Relative: 4 %
Lymphs Abs: 0.4 10*3/uL — ABNORMAL LOW (ref 0.7–4.0)
MCH: 29.9 pg (ref 26.0–34.0)
MCHC: 31.6 g/dL (ref 30.0–36.0)
MCV: 94.7 fL (ref 80.0–100.0)
Monocytes Absolute: 1.1 10*3/uL — ABNORMAL HIGH (ref 0.1–1.0)
Monocytes Relative: 10 %
Neutro Abs: 10 10*3/uL — ABNORMAL HIGH (ref 1.7–7.7)
Neutrophils Relative %: 85 %
Platelets: 263 10*3/uL (ref 150–400)
RBC: 4.75 MIL/uL (ref 4.22–5.81)
RDW: 17.8 % — ABNORMAL HIGH (ref 11.5–15.5)
WBC: 11.7 10*3/uL — ABNORMAL HIGH (ref 4.0–10.5)
nRBC: 0 % (ref 0.0–0.2)

## 2019-05-27 LAB — RENAL FUNCTION PANEL
Albumin: 3.1 g/dL — ABNORMAL LOW (ref 3.5–5.0)
Albumin: 2.8 g/dL — ABNORMAL LOW (ref 3.5–5.0)
Anion gap: 13 (ref 5–15)
Anion gap: 11 (ref 5–15)
BUN: 14 mg/dL (ref 6–20)
BUN: 8 mg/dL (ref 6–20)
CO2: 26 mmol/L (ref 22–32)
CO2: 25 mmol/L (ref 22–32)
Calcium: 8.6 mg/dL — ABNORMAL LOW (ref 8.9–10.3)
Calcium: 8.4 mg/dL — ABNORMAL LOW (ref 8.9–10.3)
Chloride: 97 mmol/L — ABNORMAL LOW (ref 98–111)
Chloride: 100 mmol/L (ref 98–111)
Creatinine, Ser: 3.57 mg/dL — ABNORMAL HIGH (ref 0.61–1.24)
Creatinine, Ser: 2.66 mg/dL — ABNORMAL HIGH (ref 0.61–1.24)
GFR calc Af Amer: 21 mL/min — ABNORMAL LOW (ref >60)
GFR calc Af Amer: 29 mL/min — ABNORMAL LOW (ref >60)
GFR calc non Af Amer: 18 mL/min — ABNORMAL LOW (ref >60)
GFR calc non Af Amer: 25 mL/min — ABNORMAL LOW (ref >60)
Glucose, Bld: 118 mg/dL — ABNORMAL HIGH (ref 70–99)
Glucose, Bld: 98 mg/dL (ref 70–99)
Phosphorus: 3.5 mg/dL (ref 2.5–4.6)
Phosphorus: 2.6 mg/dL (ref 2.5–4.6)
Potassium: 4.4 mmol/L (ref 3.5–5.1)
Potassium: 4.2 mmol/L (ref 3.5–5.1)
Sodium: 136 mmol/L (ref 135–145)
Sodium: 136 mmol/L (ref 135–145)

## 2019-05-27 LAB — POCT ACTIVATED CLOTTING TIME
Activated Clotting Time: 175 seconds
Activated Clotting Time: 191 seconds
Activated Clotting Time: 202 seconds
Activated Clotting Time: 202 seconds
Activated Clotting Time: 208 seconds
Activated Clotting Time: 186 seconds
Activated Clotting Time: 186 seconds
Activated Clotting Time: 186 seconds
Activated Clotting Time: 191 seconds
Activated Clotting Time: 197 seconds
Activated Clotting Time: 224 seconds
Activated Clotting Time: 202 seconds
Activated Clotting Time: 208 seconds

## 2019-05-27 LAB — APTT: aPTT: >200 seconds (ref 24–36)

## 2019-05-27 LAB — MAGNESIUM: Magnesium: 2.3 mg/dL (ref 1.7–2.4)

## 2019-05-27 LAB — LACTATE DEHYDROGENASE: LDH: 174 U/L (ref 98–192)

## 2019-05-27 LAB — LACTIC ACID, PLASMA: Lactic Acid, Venous: 1.9 mmol/L (ref 0.5–1.9)

## 2019-05-27 LAB — GLUCOSE, CAPILLARY
Glucose-Capillary: 104 mg/dL — ABNORMAL HIGH (ref 70–99)
Glucose-Capillary: 116 mg/dL — ABNORMAL HIGH (ref 70–99)
Glucose-Capillary: 122 mg/dL — ABNORMAL HIGH (ref 70–99)
Glucose-Capillary: 67 mg/dL — ABNORMAL LOW (ref 70–99)
Glucose-Capillary: 78 mg/dL (ref 70–99)
Glucose-Capillary: 85 mg/dL (ref 70–99)
Glucose-Capillary: 87 mg/dL (ref 70–99)

## 2019-05-27 LAB — PROCALCITONIN: Procalcitonin: 0.28 ng/mL

## 2019-05-27 LAB — CYTOLOGY - NON PAP

## 2019-05-27 MED ORDER — DEXTROSE 50 % IV SOLN
INTRAVENOUS | Status: AC
  Administered 2019-05-27: 23:00:00 25 mL
  Filled 2019-05-27: qty 50

## 2019-05-27 MED ORDER — DEXTROSE 10 % IV SOLN
INTRAVENOUS | Status: DC
  Administered 2019-05-28: 01:00:00 via INTRAVENOUS

## 2019-05-27 MED ORDER — ASPIRIN 325 MG PO TABS
325.0000 mg | ORAL_TABLET | Freq: Every day | ORAL | Status: DC
  Administered 2019-05-27 – 2019-06-01 (×6): 325 mg via ORAL
  Filled 2019-05-27 (×6): qty 1

## 2019-05-27 MED ORDER — NOREPINEPHRINE 4 MG/250ML-% IV SOLN: 0.0000 ug/min | INTRAVENOUS | Status: DC

## 2019-05-27 NOTE — Progress Notes (Addendum)
NAME:  Shawn [name], MRN:  [id number], DOB:  1960/11/14, LOS: 2 ADMISSION DATE:  05/25/2019, CONSULTATION DATE:  05/25/19 REFERRING MD:  ER, CHIEF COMPLAINT:  AMS   Brief History   58 year old man with hx of HD dependent ESRD presenting with syncopal episode in HD.  History of present illness   58 year old man with hx of HD dependent ESRD presenting with syncopal episode in HD.  Noted to be bradycardic, hypoatnsive brought to ER externally paced.  Seen in ER, pacing turned off and Bps stabilized.  Noted to be hypoxemic and hypercarbic.  PCCM asked to admit.  Past Medical History  CVA, DM, HTN, ESRD, Blind  Significant Hospital Events   11/16 Admitted 11/17 CRRT started, BiPAP temporarily for pure resp acidosis.   Consults:  Cardiology  Procedures:  11/16 R thoracentesis  Significant Diagnostic Tests:  CXR bilateral effusions, R>L CT chest 11/18 >  Micro Data:  Pleural fluid 11/18 Blood cx 11/16 > COVID > negative  Antimicrobials:  Vanc 11/16  > 11/17 Zosyn 11/16 > 11/18  Interim history/subjective:  Much more awake this morning, no complaints. No BiPAP since yesterday afternoon  Objective   Blood pressure (!) 105/57, pulse 69, temperature 97.8 F (36.6 C), temperature source Oral, resp. rate 15, height 6' (1.829 m), weight 83.2 kg, SpO2 94 %.    FiO2 (%):  [40 %] 40 %   Intake/Output Summary (Last 24 hours) at 05/27/2019 1031 Last data filed at 05/27/2019 1000 Gross per 24 hour  Intake 894.02 ml  Output 3297 ml  Net -2402.98 ml   Filed Weights   05/26/19 0000 05/26/19 0127 05/27/19 0400  Weight: 84.4 kg 84.4 kg 83.2 kg    Examination: General: adult male resting comfortably HENT: [place]/AT, PERRL, no JVD Lungs: Clear, unlabored Cardiovascular: RRR, no MRG Abdomen: Soft, non-tender, non-distended Extremities: No acute deformity or ROM limitation Neuro:RASS 0. Alert oriented, talking football with me and naming current players.   Resolved Hospital  Problem list   NA  Assessment & Plan:  Acute hypercarbic respiratory failure: in the setting of large bilateral pleural effusions. Improved after thoracentesis on R. Now on 2L with adequate O2 sats.  - Supplemental O2 to keep sats > 92% - Incentive spirometry - DC BiPAP  Bilateral pleural effusion R > L: s/p R thoracentesis 11/16. Brown milky appearance. Question of infection/malignancy/chylothorax. Transudate by lytes. 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  - Await cultures, cytology.  - Follow CXR - Discontinue antibiotics. Procal 0.28 - Will need CT chest once off CRRT to evaluate for mass/LAN  ESRD on HD - Nephrology following - Continue CRRT  Shock-like episode in HD, vasoplegia vs. Chronotropic insufficiency with question of sepsis. Low dose norepi briefly 11/17, now off.  - Hold all antihypertensives - Midodrine - Off pressors  Acute metabolic encephalopathy: - improved  Best practice:  Diet: Renal diet Pain/Anxiety/Delirium protocol (if indicated): None VAP protocol SQH GI prophylaxis: NA Glucose control: SSI Mobility: BR Code Status: Full Family Communication: Patient updated 11/18 Disposition:  ICU  Labs   CBC: Recent Labs  Lab 05/25/19 1115  05/25/19 1136 05/25/19 1400 05/26/19 0749 05/26/19 0858 05/27/19 0042  WBC 6.2  --   --   --  12.4*  --  11.7*  NEUTROABS 4.8  --   --   --   --   --  10.0*  HGB 10.1*   < > 18.0* 17.3* 14.7 16.7 14.2  HCT 34.2*   < >  53.0* 51.0 47.4 49.0 45.0  MCV 100.9*  --   --   --  96.5  --  94.7  PLT 146*  --   --   --  302  --  263   < > = values in this interval not displayed.    Basic Metabolic Panel: Recent Labs  Lab 05/25/19 1136 05/25/19 1216 05/25/19 1400 05/26/19 0359 05/26/19 0858 05/26/19 1517 05/27/19 0042  NA 135 134* 133* 135 132* 134* 136  K 4.0 4.1 4.3 5.0 4.7 4.9 4.4  CL 96* 92*  --  94*  --  97* 97*  CO2  --  27  --  23  --  20* 26  GLUCOSE 154* 156*  --  146*  --  109* 118*   BUN 10 9  --  16  --  18 14  CREATININE 3.20* 3.82*  --  4.61*  --  4.61* 3.57*  CALCIUM  --  8.4*  --  8.7*  --  8.7* 8.6*  MG  --  2.4  --   --   --   --  2.3  PHOS  --   --   --   --   --  5.4* 3.5   GFR: Estimated Creatinine Clearance: 24.8 mL/min (A) (by C-G formula based on SCr of 3.57 mg/dL (H)). Recent Labs  Lab 05/25/19 1109 05/25/19 1115 05/25/19 1217 05/26/19 0749 05/27/19 0042  PROCALCITON  --   --   --   --  0.28  WBC  --  6.2  --  12.4* 11.7*  LATICACIDVEN 2.7*  --  2.2*  --  1.9    Liver Function Tests: Recent Labs  Lab 05/25/19 1216 05/26/19 1517 05/27/19 0042  AST 23  --   --   ALT 15  --   --   ALKPHOS 103  --   --   BILITOT 1.4*  --   --   PROT 7.1  --   --   ALBUMIN 3.5 3.2* 3.1*   No results for input(s): LIPASE, AMYLASE in the last 168 hours. Recent Labs  Lab 05/25/19 1217  AMMONIA 41*    ABG    Component Value Date/Time   PHART 7.228 (L) 05/26/2019 0858   PCO2ART 61.7 (H) 05/26/2019 0858   PO2ART 60.0 (L) 05/26/2019 0858   HCO3 26.0 05/26/2019 0858   TCO2 28 05/26/2019 0858   ACIDBASEDEF 4.0 (H) 05/26/2019 0858   O2SAT 87.0 05/26/2019 0858     Coagulation Profile: Recent Labs  Lab 05/25/19 1216  INR 1.4*    Cardiac Enzymes: No results for input(s): CKTOTAL, CKMB, CKMBINDEX, TROPONINI in the last 168 hours.  HbA1C: Hgb A1c MFr Bld  Date/Time Value Ref Range Status  12/21/2017 05:14 PM 7.5 (H) 4.8 - 5.6 % Final    Comment:    (NOTE) Pre diabetes:          5.7%-6.4% Diabetes:              >6.4% Glycemic control for   <7.0% adults with diabetes   01/23/2016 11:51 AM 8.3 (H) 4.8 - 5.6 % Final    Comment:    (NOTE)         Pre-diabetes: 5.7 - 6.4         Diabetes: >6.4         Glycemic control for adults with diabetes: <7.0     CBG: Recent Labs  Lab 05/26/19 1608 05/26/19 2004 05/26/19 2357 05/27/19  [id number] 05/27/19 0745  GLUCAP 84 106* 104* 122* 87    Review of Systems:     Past Medical History  He,  has  a past medical history of Anemia, Arthritis, Blind right eye, Chronic kidney disease, Complication of anesthesia, Diabetes mellitus, Hypertension, Legally blind, No pertinent past medical history, PONV (postoperative nausea and vomiting), Shortness of breath dyspnea, Stroke ([place]) (2012), Syncope, and Unspecified cerebral artery occlusion with cerebral infarction (04/02/2013).   Surgical History    Past Surgical History:  Procedure Laterality Date  . AV FISTULA PLACEMENT Right 05/12/2013   Procedure: INSERTION OF ARTERIOVENOUS (AV) GORE-TEX GRAFT ARM; ULTRASOUND GUIDED;  Surgeon: Conrad [place], MD;  Location: [facility];  Service: Vascular;  Laterality: Right;  . AV FISTULA PLACEMENT Left 01/18/2015   Procedure: INSERTION OF ARTERIOVENOUS GORE-TEX GRAFT LEFT UPPER ARM;  Surgeon: Conrad [place], MD;  Location: [facility];  Service: Vascular;  Laterality: Left;  . COLONOSCOPY  06/2012  . EYE SURGERY Bilateral    retina surgery both eyes, cataract surgery both eyes  . HERNIA REPAIR     right inguinal  . INSERTION OF DIALYSIS CATHETER Right   . left arm graft  10/2010  . LIGATION ARTERIOVENOUS GORTEX GRAFT Left 05/03/2015   Procedure: LIGATION ARTERIOVENOUS GORTEX GRAFT-LEFT UPPER ARM;  Surgeon: Conrad Galva, MD;  Location: [facility];  Service: Vascular;  Laterality: Left;     Social History   reports that he has never smoked. He has never used smokeless tobacco. He reports that he does not drink alcohol or use drugs.   Family History   His family history includes Alzheimer's disease in his mother; Cancer in his father and sister; Diabetes in his mother; Heart disease in his father; Stroke in his sister.   Allergies No Known Allergies   Home Medications  Prior to Admission medications   Medication Sig Start Date End Date Taking? Authorizing Provider  amLODipine (NORVASC) 10 MG tablet Take 10 mg by mouth at bedtime.   Yes [provider]  brimonidine (ALPHAGAN) 0.2 % ophthalmic solution Place 1 drop  into the left eye 2 (two) times daily.  11/19/18  Yes [provider]  insulin detemir (LEVEMIR) 100 UNIT/ML injection Inject 15 Units into the skin daily after breakfast.    Yes [provider]  lanthanum (FOSRENOL) 1000 MG chewable tablet Chew 1,000 mg by mouth 3 (three) times daily with meals.   Yes [provider]  latanoprost (XALATAN) 0.005 % ophthalmic solution Place 1 drop into the left eye at bedtime.   Yes [provider]  lisinopril (PRINIVIL,ZESTRIL) 10 MG tablet Take 10 mg by mouth daily.  06/11/18  Yes [provider]  metoprolol tartrate (LOPRESSOR) 25 MG tablet Take 25 mg by mouth 2 (two) times daily. 12/25/17  Yes [provider]  aspirin EC 325 MG tablet Take 325 mg by mouth at bedtime.    [provider]  atorvastatin (LIPITOR) 40 MG tablet Take 1 tablet (40 mg total) by mouth daily. Patient not taking: Reported on 05/25/2019 04/29/18 05/05/19  Buford Dresser, MD  b complex vitamins tablet Take 1 tablet by mouth at bedtime.    [provider]  diphenhydramine-acetaminophen (TYLENOL PM) 25-500 MG TABS tablet Take 1 tablet by mouth at bedtime as needed (pain).    [provider]  dorzolamide-timolol (COSOPT) 22.3-6.8 MG/ML ophthalmic solution Place 1 drop into the left eye 2 (two) times daily.    [provider]  erythromycin ophthalmic ointment  Place 1 application into the left eye at bedtime as needed (dry eyes/itching).  12/12/17   [provider]     Critical care time:      Georgann Housekeeper, AGACNP-BC [place] for personal pager PCCM on call pager [phone]  05/27/2019 10:31 AM

## 2019-05-27 NOTE — Progress Notes (Signed)
[facility] Progress Note Patient Name: Shawn [name] DOB: 12-Apr-1961 MRN: [id number]   Date of Service  05/27/2019  HPI/Events of Note  Hypoglycemia  eICU Interventions  D 10 % W 30 ml/ hour        Frederik Pear 05/27/2019, 11:51 PM

## 2019-05-27 NOTE — Progress Notes (Signed)
Hypoglycemic Event  CBG: 67  Treatment:1/2 amp D50%  Symptoms: none noted  Possible Reasons for Event: Pt did not eat his dinner to being on bipap  Comments/MD notified: Gavin Pound

## 2019-05-27 NOTE — Progress Notes (Signed)
Pt not easily arousable at this time. He arouses briefly, remains oriented but immediately falls back to sleep. Placed on bipap RT made aware. Patient has an ABG ordered for 9pm. RN will continue to monitor.

## 2019-05-27 NOTE — Progress Notes (Signed)
Subjective:  On CRRT- have removed 2 liters so far-  On low dose pressors still - he looks much better, recognizes me-  Was able to tell me that he was taking those antihypertensives at home   Objective Vital signs in last 24 hours: Vitals:   05/27/19 0615 05/27/19 0630 05/27/19 0645 05/27/19 0700  BP: (!) 107/51 (!) 107/52 100/72 100/61  Pulse: 75 73 78 69  Resp: 20 17 19 14   Temp:      TempSrc:      SpO2: 95% 96% 93% 97%  Weight:      Height:       Weight change: -22.8 kg  Intake/Output Summary (Last 24 hours) at 05/27/2019 [id number] Last data filed at 05/27/2019 0700 Gross per 24 hour  Intake 819.04 ml  Output 2877 ml  Net -2057.96 ml    Dialysis Orders: Center: [place]  on MWF . EDW 86.5 HD Bath 3k, 2ca  Time  4hr72min  Access L  IJ  Perm cath   Heparin 6000.  Calcitriol 0.25mcg po q hd    Venofer  50 mg  q hd    No ESA = Last OP HGB 13.9  05/20/19   Assessment/ Plan: Pt is a 58 y.o. yo male with ESRD who was admitted on 05/25/2019 with hypotension and hypoxia related to transudative pleural effusions  Assessment/Plan: 1. Pulm-  Per CCM- O2 req much less with UF- on zosyn-  Pleural effusions possibly secondary to volume overload  2. ESRD - normally MWF at [facility] via [facility].  Had incident with HD on Monday- got hypotensive after treatment.  Now on CRRT given volume overload and hypotension.  Has been effective so far with volume removal.  Would like to increase UF rate to 200 per hour and continue another 24 hours 3. Anemia- not an issue at this time 4. Secondary hyperparathyroidism- look to resume calcitriol and binder when back on IHD  5. HTN/volume-  Had been on multiple antihypertensives as an OP-  Those have all been stopped.  Req pressor support and midodrine   Louis Meckel    Labs: Basic Metabolic Panel: Recent Labs  Lab 05/26/19 0359 05/26/19 0858 05/26/19 1517 05/27/19 0042  NA 135 132* 134* 136  K 5.0 4.7 4.9 4.4  CL 94*  --  97* 97*  CO2 23  --  20* 26   GLUCOSE 146*  --  109* 118*  BUN 16  --  18 14  CREATININE 4.61*  --  4.61* 3.57*  CALCIUM 8.7*  --  8.7* 8.6*  PHOS  --   --  5.4* 3.5   Liver Function Tests: Recent Labs  Lab 05/25/19 1216 05/26/19 1517 05/27/19 0042  AST 23  --   --   ALT 15  --   --   ALKPHOS 103  --   --   BILITOT 1.4*  --   --   PROT 7.1  --   --   ALBUMIN 3.5 3.2* 3.1*   No results for input(s): LIPASE, AMYLASE in the last 168 hours. Recent Labs  Lab 05/25/19 1217  AMMONIA 41*   CBC: Recent Labs  Lab 05/25/19 1115  05/26/19 0749 05/26/19 0858 05/27/19 0042  WBC 6.2  --  12.4*  --  11.7*  NEUTROABS 4.8  --   --   --  10.0*  HGB 10.1*   < > 14.7 16.7 14.2  HCT 34.2*   < > 47.4 49.0 45.0  MCV 100.9*  --  96.5  --  94.7  PLT 146*  --  302  --  263   < > = values in this interval not displayed.   Cardiac Enzymes: No results for input(s): CKTOTAL, CKMB, CKMBINDEX, TROPONINI in the last 168 hours. CBG: Recent Labs  Lab 05/26/19 1149 05/26/19 1608 05/26/19 2004 05/26/19 2357 05/27/19 0426  GLUCAP 106* 84 106* 104* 122*    Iron Studies: No results for input(s): IRON, TIBC, TRANSFERRIN, FERRITIN in the last 72 hours. Studies/Results: Ct Head Wo Contrast  Result Date: 05/25/2019 CLINICAL DATA:  Altered level of consciousness. EXAM: CT HEAD WITHOUT CONTRAST TECHNIQUE: Contiguous axial images were obtained from the base of the skull through the vertex without intravenous contrast. COMPARISON:  October 26, 2010. FINDINGS: Brain: Mild chronic ischemic white matter disease is noted. No mass effect or midline shift is noted. Ventricular size is within normal limits. There is no evidence of mass lesion, hemorrhage or acute infarction. Vascular: No hyperdense vessel or unexpected calcification. Skull: Normal. Negative for fracture or focal lesion. Sinuses/Orbits: No acute finding. Other: None. IMPRESSION: Mild chronic ischemic white matter disease. No acute intracranial abnormality seen. Electronically  Signed   By: Marijo Conception M.D.   On: 05/25/2019 12:38   Dg Chest Port 1 View  Result Date: 05/26/2019 CLINICAL DATA:  Pleural effusions EXAM: PORTABLE CHEST 1 VIEW COMPARISON:  May 25, 2019. FINDINGS: Central catheter tip is in the right atrium near the junction with the inferior vena cava. No pneumothorax. There are pleural effusions bilaterally. There is atelectatic change in both lower lobes. There is no frank consolidation. There is mild cardiomegaly with pulmonary vascularity within normal limits. No adenopathy. No bone lesions. IMPRESSION: Central catheter tip at junction of right atrium and inferior vena cava. No pneumothorax. Stable pleural effusions bilaterally with bibasilar atelectatic change. No frank consolidation. Stable cardiac prominence. Electronically Signed   By: Lowella Grip III M.D.   On: 05/26/2019 07:10   Dg Chest Port 1 View  Result Date: 05/25/2019 CLINICAL DATA:  Post thoracentesis EXAM: PORTABLE CHEST 1 VIEW COMPARISON:  05/25/2019, 12/21/2017 FINDINGS: Right-sided jugular venous catheter tip over the right atrial IVC junction. Decreased right pleural effusion with small residual. No pneumothorax. Moderate left pleural effusion remains. Left greater than right basilar airspace disease. Stable cardiomediastinal silhouette IMPRESSION: 1. Decreased right pleural effusion with small residual. No pneumothorax 2. Moderate left pleural effusion with left greater than right basilar airspace disease Electronically Signed   By: Donavan Foil M.D.   On: 05/25/2019 16:36   Dg Chest Port 1 View  Result Date: 05/25/2019 CLINICAL DATA:  Short of breath.  Dialysis patient EXAM: PORTABLE CHEST 1 VIEW COMPARISON:  None. FINDINGS: Large-bore central venous line with tip in the RIGHT atrium. Cardiac silhouette grossly normal. Resuscitation pads overlie the mediastinum. There is a large RIGHT pleural effusion which is new from prior. Central venous congestion noted. potential RIGHT  lower lobe consolidation. IMPRESSION: 1. New large RIGHT pleural effusion. 2. Central venous congestion. 3. Potential RIGHT lower lobe consolidation. 4. No pneumothorax. Electronically Signed   By: Suzy Bouchard M.D.   On: 05/25/2019 13:03   Medications: Infusions: .  prismasol BGK 4/2.5 300 mL/hr at 05/27/19 0519  .  prismasol BGK 4/2.5 300 mL/hr at 05/27/19 0519  . sodium chloride 10 mL/hr at 05/27/19 0700  . heparin 10,000 units/ 20 mL infusion syringe 500 Units/hr (05/27/19 0517)  . norepinephrine (LEVOPHED) Adult infusion Stopped (05/27/19 0603)  . piperacillin-tazobactam Stopped (05/27/19 0304)  .  prismasol BGK 4/2.5 1,500 mL/hr at 05/27/19 [id number]    Scheduled Medications: . Chlorhexidine Gluconate Cloth  6 each Topical Daily  . heparin  5,000 Units Subcutaneous Q8H  . insulin aspart  0-24 Units Subcutaneous Q4H  . midodrine  10 mg Oral TID WC  . pantoprazole (PROTONIX) IV  40 mg Intravenous QHS  . polyethylene glycol  17 g Oral QHS    have reviewed scheduled and prn medications.  Physical Exam: General: more alert, [place] 02- recognizes me Heart: RRR Lungs: dec BS at bases  Abdomen: soft, non tender Extremities: pitting edema Dialysis Access: TDC    05/27/2019,7:33 AM  LOS: 2 days

## 2019-05-28 ENCOUNTER — Inpatient Hospital Stay (HOSPITAL_COMMUNITY): Payer: Medicare Other

## 2019-05-28 LAB — GLUCOSE, CAPILLARY
Glucose-Capillary: 69 mg/dL — ABNORMAL LOW (ref 70–99)
Glucose-Capillary: 87 mg/dL (ref 70–99)
Glucose-Capillary: 81 mg/dL (ref 70–99)
Glucose-Capillary: 71 mg/dL (ref 70–99)
Glucose-Capillary: 74 mg/dL (ref 70–99)
Glucose-Capillary: 74 mg/dL (ref 70–99)
Glucose-Capillary: 58 mg/dL — ABNORMAL LOW (ref 70–99)

## 2019-05-28 LAB — CHOLESTEROL, BODY FLUID: Cholesterol, Fluid: 85 mg/dL

## 2019-05-28 LAB — RENAL FUNCTION PANEL
Albumin: 2.8 g/dL — ABNORMAL LOW (ref 3.5–5.0)
Albumin: 2.8 g/dL — ABNORMAL LOW (ref 3.5–5.0)
Anion gap: 9 (ref 5–15)
Anion gap: 11 (ref 5–15)
BUN: 8 mg/dL (ref 6–20)
BUN: 10 mg/dL (ref 6–20)
CO2: 25 mmol/L (ref 22–32)
CO2: 20 mmol/L — ABNORMAL LOW (ref 22–32)
Calcium: 8.4 mg/dL — ABNORMAL LOW (ref 8.9–10.3)
Calcium: 8.4 mg/dL — ABNORMAL LOW (ref 8.9–10.3)
Chloride: 101 mmol/L (ref 98–111)
Chloride: 102 mmol/L (ref 98–111)
Creatinine, Ser: 2.21 mg/dL — ABNORMAL HIGH (ref 0.61–1.24)
Creatinine, Ser: 2.58 mg/dL — ABNORMAL HIGH (ref 0.61–1.24)
GFR calc Af Amer: 37 mL/min — ABNORMAL LOW (ref >60)
GFR calc Af Amer: 30 mL/min — ABNORMAL LOW (ref >60)
GFR calc non Af Amer: 32 mL/min — ABNORMAL LOW (ref >60)
GFR calc non Af Amer: 26 mL/min — ABNORMAL LOW (ref >60)
Glucose, Bld: 82 mg/dL (ref 70–99)
Glucose, Bld: 71 mg/dL (ref 70–99)
Phosphorus: 2.1 mg/dL — ABNORMAL LOW (ref 2.5–4.6)
Phosphorus: 2.3 mg/dL — ABNORMAL LOW (ref 2.5–4.6)
Potassium: 4 mmol/L (ref 3.5–5.1)
Potassium: 4.2 mmol/L (ref 3.5–5.1)
Sodium: 135 mmol/L (ref 135–145)
Sodium: 133 mmol/L — ABNORMAL LOW (ref 135–145)

## 2019-05-28 LAB — POCT I-STAT 7, (LYTES, BLD GAS, ICA,H+H)
Acid-base deficit: 2 mmol/L (ref 0.0–2.0)
Bicarbonate: 26.7 mmol/L (ref 20.0–28.0)
Calcium, Ion: 1.21 mmol/L (ref 1.15–1.40)
HCT: 46 % (ref 39.0–52.0)
Hemoglobin: 15.6 g/dL (ref 13.0–17.0)
O2 Saturation: 98 %
Patient temperature: 98.6
Potassium: 4.1 mmol/L (ref 3.5–5.1)
Sodium: 136 mmol/L (ref 135–145)
TCO2: 29 mmol/L (ref 22–32)
pCO2 arterial: 61.8 mmHg — ABNORMAL HIGH (ref 32.0–48.0)
pH, Arterial: 7.243 — ABNORMAL LOW (ref 7.350–7.450)
pO2, Arterial: 123 mmHg — ABNORMAL HIGH (ref 83.0–108.0)

## 2019-05-28 LAB — CBC WITH DIFFERENTIAL/PLATELET
Abs Immature Granulocytes: 0.05 10*3/uL (ref 0.00–0.07)
Basophils Absolute: 0 10*3/uL (ref 0.0–0.1)
Basophils Relative: 0 %
Eosinophils Absolute: 0.1 10*3/uL (ref 0.0–0.5)
Eosinophils Relative: 1 %
HCT: 42.6 % (ref 39.0–52.0)
Hemoglobin: 13.4 g/dL (ref 13.0–17.0)
Immature Granulocytes: 0 %
Lymphocytes Relative: 4 %
Lymphs Abs: 0.4 10*3/uL — ABNORMAL LOW (ref 0.7–4.0)
MCH: 29.7 pg (ref 26.0–34.0)
MCHC: 31.5 g/dL (ref 30.0–36.0)
MCV: 94.5 fL (ref 80.0–100.0)
Monocytes Absolute: 1.6 10*3/uL — ABNORMAL HIGH (ref 0.1–1.0)
Monocytes Relative: 14 %
Neutro Abs: 9.3 10*3/uL — ABNORMAL HIGH (ref 1.7–7.7)
Neutrophils Relative %: 81 %
Platelets: 211 10*3/uL (ref 150–400)
RBC: 4.51 MIL/uL (ref 4.22–5.81)
RDW: 18.4 % — ABNORMAL HIGH (ref 11.5–15.5)
WBC: 11.5 10*3/uL — ABNORMAL HIGH (ref 4.0–10.5)
nRBC: 0.2 % (ref 0.0–0.2)

## 2019-05-28 LAB — MAGNESIUM: Magnesium: 2.5 mg/dL — ABNORMAL HIGH (ref 1.7–2.4)

## 2019-05-28 LAB — APTT: aPTT: >200 seconds (ref 24–36)

## 2019-05-28 LAB — PROCALCITONIN: Procalcitonin: 0.27 ng/mL

## 2019-05-28 LAB — POCT ACTIVATED CLOTTING TIME: Activated Clotting Time: 202 seconds

## 2019-05-28 MED ORDER — CHLORHEXIDINE GLUCONATE CLOTH 2 % EX PADS
6.0000 | MEDICATED_PAD | Freq: Every day | CUTANEOUS | Status: DC
  Administered 2019-05-29 – 2019-06-01 (×3): 6 via TOPICAL

## 2019-05-28 MED ORDER — PRISMASOL BGK 4/2.5 32-4-2.5 MEQ/L REPLACEMENT SOLN
Status: DC
  Administered 2019-05-28 – 2019-05-30 (×4): via INTRAVENOUS_CENTRAL
  Filled 2019-05-28 (×7): qty 5000

## 2019-05-28 MED ORDER — PRISMASOL BGK 4/2.5 32-4-2.5 MEQ/L REPLACEMENT SOLN
Status: DC
  Administered 2019-05-28 – 2019-05-30 (×4): via INTRAVENOUS_CENTRAL
  Filled 2019-05-28 (×7): qty 5000

## 2019-05-28 MED ORDER — LATANOPROST 0.005 % OP SOLN
1.0000 [drp] | Freq: Every day | OPHTHALMIC | Status: DC
  Filled 2019-05-28: qty 2.5

## 2019-05-28 MED ORDER — HEPARIN SODIUM (PORCINE) 1000 UNIT/ML DIALYSIS
1000.0000 [IU] | INTRAMUSCULAR | Status: DC | PRN
  Administered 2019-05-30 – 2019-05-31 (×2): 3800 [IU] via INTRAVENOUS_CENTRAL
  Filled 2019-05-28: qty 6
  Filled 2019-05-28: qty 1
  Filled 2019-05-28 (×2): qty 6
  Filled 2019-05-28: qty 3

## 2019-05-28 MED ORDER — PRISMASOL BGK 4/2.5 32-4-2.5 MEQ/L IV SOLN
INTRAVENOUS | Status: DC
  Administered 2019-05-28 – 2019-05-30 (×13): via INTRAVENOUS_CENTRAL
  Filled 2019-05-28 (×25): qty 5000

## 2019-05-28 MED ORDER — BRIMONIDINE TARTRATE 0.2 % OP SOLN
1.0000 [drp] | Freq: Two times a day (BID) | OPHTHALMIC | Status: DC
  Administered 2019-05-28: 12:00:00 1 [drp] via OPHTHALMIC
  Filled 2019-05-28: qty 5

## 2019-05-28 MED ORDER — DORZOLAMIDE HCL-TIMOLOL MAL 2-0.5 % OP SOLN
1.0000 [drp] | Freq: Two times a day (BID) | OPHTHALMIC | Status: DC
  Administered 2019-05-28: 1 [drp] via OPHTHALMIC
  Filled 2019-05-28: qty 10

## 2019-05-28 MED ORDER — SODIUM CHLORIDE 0.9 % FOR CRRT
INTRAVENOUS_CENTRAL | Status: DC | PRN
  Filled 2019-05-28: qty 1000

## 2019-05-28 NOTE — Progress Notes (Signed)
D/w Dr Loanne Drilling  Patient felt to be very volume overloaded, Chest CT showing fairly large volume bilateral pleural effusions and atelectatic collapse of right middle and left lower lobes.  BP 130/80  But some lower BP during day 78/58  Na 133  K 4.2  Cl 102  CO2 20  BUN 10 Cr 2.5   Ca 8.4  Phos 2.3 Alb 2.8  Will restart CRRT  100- 125 cc/hr 4/2.5   Dialysis and replacement fluid   Clearance 35 cc/ Kg / hr

## 2019-05-28 NOTE — Progress Notes (Signed)
NAME:  Shawn [name], MRN:  [id number], DOB:  [birth year], LOS: 3 ADMISSION DATE:  05/25/2019, CONSULTATION DATE:  05/25/19 REFERRING MD:  ER, CHIEF COMPLAINT:  AMS   Brief History   58 year old man with hx of HD dependent ESRD presenting with syncopal episode in HD.  History of present illness   58 year old man with hx of HD dependent ESRD presenting with syncopal episode in HD.  Noted to be bradycardic, hypoatnsive brought to ER externally paced.  Seen in ER, pacing turned off and Bps stabilized.  Noted to be hypoxemic and hypercarbic.  PCCM asked to admit.  Past Medical History  CVA, DM, HTN, ESRD, Blind  Significant Hospital Events   11/16 Admitted 11/17 CRRT started, BiPAP temporarily for pure resp acidosis.   Consults:  Cardiology  Procedures:  11/16 R thoracentesis  Significant Diagnostic Tests:  CXR bilateral effusions, R>L CT chest 11/18 >  Micro Data:  Pleural fluid 11/18 Blood cx 11/16 > COVID > negative  Antimicrobials:  Vanc 11/16  > 11/17 Zosyn 11/16 > 11/18  Interim history/subjective:  Removed 4L via CRRT overnight. Awake, alert and following commands. Tolerated BiPAP overnight and currently on 3L via [place].  Objective   Blood pressure 122/63, pulse 80, temperature 98.6 F (37 C), temperature source Axillary, resp. rate 19, height 6' (1.829 m), weight 79.4 kg, SpO2 99 %.    FiO2 (%):  [40 %] 40 %   Intake/Output Summary (Last 24 hours) at 05/28/2019 1024 Last data filed at 05/28/2019 0800 Gross per 24 hour  Intake 378.61 ml  Output 4400 ml  Net -4021.39 ml   Filed Weights   05/26/19 0127 05/27/19 0400 05/28/19 0415  Weight: 84.4 kg 83.2 kg 79.4 kg   Physical Exam: General: Well-appearing, no acute distress HENT: [place], AT, OP clear, MMM Eyes: EOMI, no scleral icterus Respiratory: Clear to auscultation bilaterally.  No crackles, wheezing or rales Cardiovascular: RRR, -M/R/G, no JVD GI: BS+, soft, nontender Extremities:-Edema,-tenderness  Neuro: AAO x4, CNII-XII grossly intact Skin: Intact, no rashes or bruising Psych: Normal mood, normal affect Lines: [facility] Problem list   NA  Assessment & Plan:   Acute hypercarbic respiratory failure: in the setting of large bilateral pleural effusions. S/p R thoracentesis and volume removal via CRRT on 11/18.  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  - Follow CXR - Discontinue antibiotics. Procal 0.28 - Will need CT chest once off CRRT to evaluate for mass/LAN  ESRD on HD - CRRT/Dialysis per Nephrology  Shock-like episode in HD, vasoplegia vs. Chronotropic insufficiency with question of sepsis. Briefly required pressor support on 11/17. - Hold all antihypertensives - Continue midodrine   Best practice:  Diet: Renal diet Pain/Anxiety/Delirium protocol (if indicated): None VAP protocol SQH GI prophylaxis: NA Glucose control: SSI Mobility: BR Code Status: Full Family Communication: Patient updated 11/18 Disposition:  ICU  Labs   CBC: Recent Labs  Lab 05/25/19 1115  05/26/19 0749 05/26/19 0858 05/27/19 0042 05/27/19 2049 05/28/19 0144  WBC 6.2  --  12.4*  --  11.7*  --  11.5*  NEUTROABS 4.8  --   --   --  10.0*  --  9.3*  HGB 10.1*   < > 14.7 16.7 14.2 15.6 13.4  HCT 34.2*   < > 47.4 49.0 45.0 46.0 42.6  MCV 100.9*  --  96.5  --  94.7  --  94.5  PLT 146*  --  302  --  263  --  211   < > = values in this interval not displayed.    Basic Metabolic Panel: Recent Labs  Lab 05/25/19 1216  05/26/19 0359  05/26/19 1517 05/27/19 0042 05/27/19 1528 05/27/19 2049 05/28/19 0144  NA 134*   < > 135   < > 134* 136 136 136 135  K 4.1   < > 5.0   < > 4.9 4.4 4.2 4.1 4.0  CL 92*  --  94*  --  97* 97* 100  --  101  CO2 27  --  23  --  20* 26 25  --  25  GLUCOSE 156*  --  146*  --  109* 118* 98  --  82  BUN 9  --  16  --  18 14 8   --  8   CREATININE 3.82*  --  4.61*  --  4.61* 3.57* 2.66*  --  2.21*  CALCIUM 8.4*  --  8.7*  --  8.7* 8.6* 8.4*  --  8.4*  MG 2.4  --   --   --   --  2.3  --   --  2.5*  PHOS  --   --   --   --  5.4* 3.5 2.6  --  2.1*   < > = values in this interval not displayed.   GFR: Estimated Creatinine Clearance: 40 mL/min (A) (by C-G formula based on SCr of 2.21 mg/dL (H)). Recent Labs  Lab 05/25/19 1109 05/25/19 1115 05/25/19 1217 05/26/19 0749 05/27/19 0042 05/28/19 0144  PROCALCITON  --   --   --   --  0.28 0.27  WBC  --  6.2  --  12.4* 11.7* 11.5*  LATICACIDVEN 2.7*  --  2.2*  --  1.9  --     Liver Function Tests: Recent Labs  Lab 05/25/19 1216 05/26/19 1517 05/27/19 0042 05/27/19 1528 05/28/19 0144  AST 23  --   --   --   --   ALT 15  --   --   --   --   ALKPHOS 103  --   --   --   --   BILITOT 1.4*  --   --   --   --   PROT 7.1  --   --   --   --   ALBUMIN 3.5 3.2* 3.1* 2.8* 2.8*   No results for input(s): LIPASE, AMYLASE in the last 168 hours. Recent Labs  Lab 05/25/19 1217  AMMONIA 41*    ABG    Component Value Date/Time   PHART 7.243 (L) 05/27/2019 2049   PCO2ART 61.8 (H) 05/27/2019 2049   PO2ART 123.0 (H) 05/27/2019 2049   HCO3 26.7 05/27/2019 2049   TCO2 29 05/27/2019 2049   ACIDBASEDEF 2.0 05/27/2019 2049   O2SAT 98.0 05/27/2019 2049     Coagulation Profile: Recent Labs  Lab 05/25/19 1216  INR 1.4*    Cardiac Enzymes: No results for input(s): CKTOTAL, CKMB, CKMBINDEX, TROPONINI in the last 168 hours.  HbA1C: Hgb A1c MFr Bld  Date/Time Value Ref Range Status  12/21/2017 05:14 PM 7.5 (H) 4.8 - 5.6 % Final    Comment:    (NOTE) Pre diabetes:          5.7%-6.4% Diabetes:              >6.4% Glycemic control for   <  7.0% adults with diabetes   01/23/2016 11:51 AM 8.3 (H) 4.8 - 5.6 % Final    Comment:    (NOTE)         Pre-diabetes: 5.7 - 6.4         Diabetes: >6.4         Glycemic control for adults with diabetes: <7.0     CBG: Recent Labs   Lab 05/27/19 1946 05/27/19 2320 05/28/19 0314 05/28/19 0411 05/28/19 0745  GLUCAP 85 67* 69* 87 81    Critical care time: 31 min    The patient is critically ill with multiple organ systems failure and requires high complexity decision making for assessment and support, frequent evaluation and titration of therapies, application of advanced monitoring technologies and extensive interpretation of multiple databases.   Rodman Pickle, M.D. [facility] Pulmonary/Critical Care Medicine 05/28/2019 10:24 AM  Pager: [phone] After hours pager: [phone]

## 2019-05-28 NOTE — Progress Notes (Signed)
CRITICAL VALUE ALERT  Critical Value:  PTT >200  Date & Time Notied:  05/28/19 0545  Provider Notified: Nephrology  Orders Received/Actions taken: continue heparin syringe per CRRT protocol

## 2019-05-28 NOTE — Progress Notes (Signed)
Pt states he does not know what eye drops he uses at night or which eye they go in. Orders states to go into left eye, pt claims they are administered at home in the right. Pt said his wife keeps up with this information but does not want me to call and wake her up at this time to ask her. Advised pt we would hold eye drops tonight and clarify orders in the morning.

## 2019-05-28 NOTE — Progress Notes (Signed)
Subjective:  CRRT ran well overnight- have removed another 4 liters. Having some hypoglycemia and needed bipap overnight for lethargy and  resp acidosis but alert this AM and wants to eat   Objective Vital signs in last 24 hours: Vitals:   05/28/19 0415 05/28/19 0500 05/28/19 0600 05/28/19 0700  BP:  (!) 95/58 (!) 115/59 (!) 83/62  Pulse:  72 79 82  Resp:  14 17 (!) 23  Temp:      TempSrc:      SpO2:  98% 99% 97%  Weight: 79.4 kg     Height:       Weight change: -3.8 kg  Intake/Output Summary (Last 24 hours) at 05/28/2019 0726 Last data filed at 05/28/2019 0700 Gross per 24 hour  Intake 423.59 ml  Output 4820 ml  Net -4396.41 ml    Dialysis Orders: Center: [place]  on MWF . EDW 86.5 HD Bath 3k, 2ca  Time  4hr45min  Access L  IJ  Perm cath   Heparin 6000.  Calcitriol 0.26mcg po q hd    Venofer  50 mg  q hd    No ESA = Last OP HGB 13.9  05/20/19   Assessment/ Plan: Pt is a 58 y.o. yo male with ESRD who was admitted on 05/25/2019 with hypotension and hypoxia related to transudative pleural effusions  Assessment/Plan: 1. Pulm-  Per CCM- O2 req less with UF- on zosyn prior but now stopped-  Pleural effusions possibly secondary to volume overload  2. ESRD - normally MWF at [facility] via [facility].  Had incident after HD on Monday- got hypotensive.  Now started on CRRT given volume overload and hypotension- ran from late 11/17 to today.  Has been effective so far with volume removal- over 6 liters.  Would like to stop CRRT and will plan for routine HD tomorrow (Friday) 3. Anemia- not an issue at this time 4. Secondary hyperparathyroidism- look to resume calcitriol and binder when back on IHD - right now phos is low 5. HTN/volume-  Had been on multiple antihypertensives as an OP-  Those have all been stopped.  Req transient pressor support and midodrine - BP low right now still.  Will see how will tolerate IHD tomorrow.  Is much under EDW by bed weight   Louis Meckel    Labs: Basic  Metabolic Panel: Recent Labs  Lab 05/27/19 0042 05/27/19 1528 05/27/19 2049 05/28/19 0144  NA 136 136 136 135  K 4.4 4.2 4.1 4.0  CL 97* 100  --  101  CO2 26 25  --  25  GLUCOSE 118* 98  --  82  BUN 14 8  --  8  CREATININE 3.57* 2.66*  --  2.21*  CALCIUM 8.6* 8.4*  --  8.4*  PHOS 3.5 2.6  --  2.1*   Liver Function Tests: Recent Labs  Lab 05/25/19 1216  05/27/19 0042 05/27/19 1528 05/28/19 0144  AST 23  --   --   --   --   ALT 15  --   --   --   --   ALKPHOS 103  --   --   --   --   BILITOT 1.4*  --   --   --   --   PROT 7.1  --   --   --   --   ALBUMIN 3.5   < > 3.1* 2.8* 2.8*   < > = values in this interval not displayed.   No  results for input(s): LIPASE, AMYLASE in the last 168 hours. Recent Labs  Lab 05/25/19 1217  AMMONIA 41*   CBC: Recent Labs  Lab 05/25/19 1115  05/26/19 0749  05/27/19 0042 05/27/19 2049 05/28/19 0144  WBC 6.2  --  12.4*  --  11.7*  --  11.5*  NEUTROABS 4.8  --   --   --  10.0*  --  9.3*  HGB 10.1*   < > 14.7   < > 14.2 15.6 13.4  HCT 34.2*   < > 47.4   < > 45.0 46.0 42.6  MCV 100.9*  --  96.5  --  94.7  --  94.5  PLT 146*  --  302  --  263  --  211   < > = values in this interval not displayed.   Cardiac Enzymes: No results for input(s): CKTOTAL, CKMB, CKMBINDEX, TROPONINI in the last 168 hours. CBG: Recent Labs  Lab 05/27/19 1536 05/27/19 1946 05/27/19 2320 05/28/19 0314 05/28/19 0411  GLUCAP 78 85 67* 69* 87    Iron Studies: No results for input(s): IRON, TIBC, TRANSFERRIN, FERRITIN in the last 72 hours. Studies/Results: No results found. Medications: Infusions: .  prismasol BGK 4/2.5 300 mL/hr at 05/27/19 2241  .  prismasol BGK 4/2.5 300 mL/hr at 05/27/19 2241  . dextrose 30 mL/hr at 05/28/19 0700  . heparin 10,000 units/ 20 mL infusion syringe 700 Units/hr (05/27/19 1817)  . norepinephrine (LEVOPHED) Adult infusion Stopped (05/27/19 2000)  . prismasol BGK 4/2.5 1,500 mL/hr at 05/28/19 0530    Scheduled  Medications: . aspirin  325 mg Oral Daily  . Chlorhexidine Gluconate Cloth  6 each Topical Daily  . heparin  5,000 Units Subcutaneous Q8H  . insulin aspart  0-24 Units Subcutaneous Q4H  . midodrine  10 mg Oral TID WC  . polyethylene glycol  17 g Oral QHS    have reviewed scheduled and prn medications.  Physical Exam: General: more alert, but on bipap this AM Heart: RRR Lungs: dec BS at bases  Abdomen: soft, non tender Extremities: pitting edema- better  Dialysis Access: TDC    05/28/2019,7:26 AM  LOS: 3 days

## 2019-05-29 ENCOUNTER — Inpatient Hospital Stay (HOSPITAL_COMMUNITY): Payer: Medicare Other

## 2019-05-29 LAB — GLUCOSE, CAPILLARY
Glucose-Capillary: 109 mg/dL — ABNORMAL HIGH (ref 70–99)
Glucose-Capillary: 136 mg/dL — ABNORMAL HIGH (ref 70–99)
Glucose-Capillary: 151 mg/dL — ABNORMAL HIGH (ref 70–99)
Glucose-Capillary: 53 mg/dL — ABNORMAL LOW (ref 70–99)
Glucose-Capillary: 67 mg/dL — ABNORMAL LOW (ref 70–99)
Glucose-Capillary: 72 mg/dL (ref 70–99)
Glucose-Capillary: 99 mg/dL (ref 70–99)

## 2019-05-29 LAB — RENAL FUNCTION PANEL
Albumin: 3 g/dL — ABNORMAL LOW (ref 3.5–5.0)
Albumin: 3 g/dL — ABNORMAL LOW (ref 3.5–5.0)
Anion gap: 13 (ref 5–15)
Anion gap: 10 (ref 5–15)
BUN: 11 mg/dL (ref 6–20)
BUN: 7 mg/dL (ref 6–20)
CO2: 23 mmol/L (ref 22–32)
CO2: 23 mmol/L (ref 22–32)
Calcium: 8.9 mg/dL (ref 8.9–10.3)
Calcium: 8.7 mg/dL — ABNORMAL LOW (ref 8.9–10.3)
Chloride: 100 mmol/L (ref 98–111)
Chloride: 100 mmol/L (ref 98–111)
Creatinine, Ser: 2.13 mg/dL — ABNORMAL HIGH (ref 0.61–1.24)
Creatinine, Ser: 1.78 mg/dL — ABNORMAL HIGH (ref 0.61–1.24)
GFR calc Af Amer: 38 mL/min — ABNORMAL LOW (ref >60)
GFR calc Af Amer: 48 mL/min — ABNORMAL LOW (ref >60)
GFR calc non Af Amer: 33 mL/min — ABNORMAL LOW (ref >60)
GFR calc non Af Amer: 41 mL/min — ABNORMAL LOW (ref >60)
Glucose, Bld: 61 mg/dL — ABNORMAL LOW (ref 70–99)
Glucose, Bld: 106 mg/dL — ABNORMAL HIGH (ref 70–99)
Phosphorus: 2 mg/dL — ABNORMAL LOW (ref 2.5–4.6)
Phosphorus: 1.9 mg/dL — ABNORMAL LOW (ref 2.5–4.6)
Potassium: 4.3 mmol/L (ref 3.5–5.1)
Potassium: 4.3 mmol/L (ref 3.5–5.1)
Sodium: 136 mmol/L (ref 135–145)
Sodium: 133 mmol/L — ABNORMAL LOW (ref 135–145)

## 2019-05-29 LAB — CBC WITH DIFFERENTIAL/PLATELET
Abs Immature Granulocytes: 0.04 10*3/uL (ref 0.00–0.07)
Basophils Absolute: 0 10*3/uL (ref 0.0–0.1)
Basophils Relative: 0 %
Eosinophils Absolute: 0.1 10*3/uL (ref 0.0–0.5)
Eosinophils Relative: 1 %
HCT: 42.4 % (ref 39.0–52.0)
Hemoglobin: 13.4 g/dL (ref 13.0–17.0)
Immature Granulocytes: 0 %
Lymphocytes Relative: 5 %
Lymphs Abs: 0.5 10*3/uL — ABNORMAL LOW (ref 0.7–4.0)
MCH: 30.1 pg (ref 26.0–34.0)
MCHC: 31.6 g/dL (ref 30.0–36.0)
MCV: 95.3 fL (ref 80.0–100.0)
Monocytes Absolute: 1.3 10*3/uL — ABNORMAL HIGH (ref 0.1–1.0)
Monocytes Relative: 13 %
Neutro Abs: 7.7 10*3/uL (ref 1.7–7.7)
Neutrophils Relative %: 81 %
Platelets: 181 10*3/uL (ref 150–400)
RBC: 4.45 MIL/uL (ref 4.22–5.81)
RDW: 18.2 % — ABNORMAL HIGH (ref 11.5–15.5)
WBC: 9.6 10*3/uL (ref 4.0–10.5)
nRBC: 0 % (ref 0.0–0.2)

## 2019-05-29 LAB — BODY FLUID CULTURE: Culture: NO GROWTH

## 2019-05-29 LAB — POCT ACTIVATED CLOTTING TIME: Activated Clotting Time: 202 seconds

## 2019-05-29 LAB — APTT: aPTT: 44 seconds — ABNORMAL HIGH (ref 24–36)

## 2019-05-29 MED ORDER — DORZOLAMIDE HCL-TIMOLOL MAL 2-0.5 % OP SOLN
1.0000 [drp] | Freq: Two times a day (BID) | OPHTHALMIC | Status: DC
  Administered 2019-05-29 – 2019-06-01 (×6): 1 [drp] via OPHTHALMIC
  Filled 2019-05-29: qty 10

## 2019-05-29 MED ORDER — LATANOPROST 0.005 % OP SOLN
1.0000 [drp] | Freq: Every day | OPHTHALMIC | Status: DC
  Administered 2019-05-29 – 2019-05-31 (×4): 1 [drp] via OPHTHALMIC
  Filled 2019-05-29: qty 2.5

## 2019-05-29 MED ORDER — BRIMONIDINE TARTRATE 0.2 % OP SOLN
1.0000 [drp] | Freq: Two times a day (BID) | OPHTHALMIC | Status: DC
  Administered 2019-05-29 – 2019-06-01 (×6): 1 [drp] via OPHTHALMIC
  Filled 2019-05-29: qty 5

## 2019-05-29 NOTE — Progress Notes (Signed)
NAME:  Shawn [name], MRN:  [id number], DOB:  [birth year], LOS: 4 ADMISSION DATE:  05/25/2019, CONSULTATION DATE:  05/25/19 REFERRING MD:  ER, CHIEF COMPLAINT:  AMS   Brief History   58 year old man with hx of HD dependent ESRD presenting with syncopal episode in HD.  History of present illness   58 year old man with hx of HD dependent ESRD presenting with syncopal episode in HD.  Noted to be bradycardic, hypoatnsive brought to ER externally paced.  Seen in ER, pacing turned off and Bps stabilized.  Noted to be hypoxemic and hypercarbic.  PCCM asked to admit.  Past Medical History  CVA, DM, HTN, ESRD, Blind  Significant Hospital Events   11/16 Admitted 11/17 CRRT started, BiPAP temporarily for pure resp acidosis.   Consults:  Cardiology  Procedures:  11/16 R thoracentesis  Significant Diagnostic Tests:  CXR bilateral effusions, R>L CT chest 11/18 >  Micro Data:  Pleural fluid 11/18 Blood cx 11/16 > COVID > negative  Antimicrobials:  Vanc 11/16  > 11/17 Zosyn 11/16 > 11/18  Interim history/subjective:  Did not require BiPAP overnight. CRRT restarted for volume overload seen on CT with large bilateral pleural effusions associated with bilateral lung collapse and atelectasis  Objective   Blood pressure 137/71, pulse 81, temperature 97.8 F (36.6 C), temperature source Oral, resp. rate 16, height 6' (1.829 m), weight 77.1 kg, SpO2 100 %.        Intake/Output Summary (Last 24 hours) at 05/29/2019 1253 Last data filed at 05/29/2019 1200 Gross per 24 hour  Intake 240 ml  Output 2210 ml  Net -1970 ml   Filed Weights   05/27/19 0400 05/28/19 0415 05/29/19 0500  Weight: 83.2 kg 79.4 kg 77.1 kg   Physical Exam: General: Well-appearing, no acute distress HENT: [place], AT, OP clear, MMM Eyes: EOMI, no scleral icterus Respiratory: Diminished breath sounds bilaterally.  No crackles, wheezing or rales Cardiovascular: RRR, -M/R/G, no JVD GI: BS+, soft, nontender  Extremities:-Edema,-tenderness Neuro: AAO x4, CNII-XII grossly intact Skin: Intact, no rashes or bruising Psych: Normal mood, normal affect Lines: [facility] Problem list   NA  Assessment & Plan:   Acute hypercarbic respiratory failure: extensive volume overload in the setting of large bilateral pleural effusions. 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 CT chest with lymphadenopathy however may be reactive in setting of volume overload. - Pleural cultures and cytology negative - Consider repeat CT Chest when euvolemic  ESRD on HD - CRRT/Dialysis per Nephrology  Shock-like episode in HD, vasoplegia vs. Chronotropic insufficiency with question of sepsis. Briefly required pressor support on 11/17. - Hold all antihypertensives - Continue midodrine   Best practice:  Diet: Renal diet Pain/Anxiety/Delirium protocol (if indicated): None VAP protocol SQH GI prophylaxis: NA Glucose control: SSI Mobility: BR Code Status: Full Family Communication: Patient updated 11/20 Disposition:  ICU  Labs   CBC: Recent Labs  Lab 05/25/19 1115  05/26/19 0749 05/26/19 0858 05/27/19 0042 05/27/19 2049 05/28/19 0144 05/29/19 0435  WBC 6.2  --  12.4*  --  11.7*  --  11.5* 9.6  NEUTROABS 4.8  --   --   --  10.0*  --  9.3* 7.7  HGB 10.1*   < > 14.7 16.7 14.2 15.6 13.4 13.4  HCT 34.2*   < > 47.4 49.0 45.0 46.0 42.6 42.4  MCV 100.9*  --  96.5  --  94.7  --  94.5 95.3  PLT 146*  --  302  --  263  --  211 181   < > = values in this interval not displayed.    Basic Metabolic Panel: Recent Labs  Lab 05/25/19 1216  05/27/19 0042 05/27/19 1528 05/27/19 2049 05/28/19 0144 05/28/19 1620 05/29/19 0435  NA 134*   < > 136 136 136 135 133* 136  K 4.1   < > 4.4 4.2 4.1 4.0 4.2 4.3  CL 92*    < > 97* 100  --  101 102 100  CO2 27   < > 26 25  --  25 20* 23  GLUCOSE 156*   < > 118* 98  --  82 71 61*  BUN 9   < > 14 8  --  8 10 11   CREATININE 3.82*   < > 3.57* 2.66*  --  2.21* 2.58* 2.13*  CALCIUM 8.4*   < > 8.6* 8.4*  --  8.4* 8.4* 8.9  MG 2.4  --  2.3  --   --  2.5*  --   --   PHOS  --    < > 3.5 2.6  --  2.1* 2.3* 2.0*   < > = values in this interval not displayed.   GFR: Estimated Creatinine Clearance: 41.2 mL/min (A) (by C-G formula based on SCr of 2.13 mg/dL (H)). Recent Labs  Lab 05/25/19 1109  05/25/19 1217 05/26/19 0749 05/27/19 0042 05/28/19 0144 05/29/19 0435  PROCALCITON  --   --   --   --  0.28 0.27  --   WBC  --    < >  --  12.4* 11.7* 11.5* 9.6  LATICACIDVEN 2.7*  --  2.2*  --  1.9  --   --    < > = values in this interval not displayed.    Liver Function Tests: Recent Labs  Lab 05/25/19 1216  05/27/19 0042 05/27/19 1528 05/28/19 0144 05/28/19 1620 05/29/19 0435  AST 23  --   --   --   --   --   --   ALT 15  --   --   --   --   --   --   ALKPHOS 103  --   --   --   --   --   --   BILITOT 1.4*  --   --   --   --   --   --   PROT 7.1  --   --   --   --   --   --   ALBUMIN 3.5   < > 3.1* 2.8* 2.8* 2.8* 3.0*   < > = values in this interval not displayed.   No results for input(s): LIPASE, AMYLASE in the last 168 hours. Recent Labs  Lab 05/25/19 1217  AMMONIA 41*    ABG    Component Value Date/Time   PHART 7.243 (L) 05/27/2019 2049   PCO2ART 61.8 (H) 05/27/2019 2049   PO2ART 123.0 (H) 05/27/2019 2049   HCO3 26.7 05/27/2019 2049   TCO2 29 05/27/2019 2049   ACIDBASEDEF 2.0 05/27/2019 2049   O2SAT 98.0 05/27/2019 2049     Coagulation Profile: Recent Labs  Lab 05/25/19 1216  INR 1.4*    Cardiac Enzymes: No results for input(s): CKTOTAL, CKMB, CKMBINDEX, TROPONINI  in the last 168 hours.  HbA1C: Hgb A1c MFr Bld  Date/Time Value Ref Range Status  12/21/2017 05:14 PM 7.5 (H) 4.8 - 5.6 % Final    Comment:    (NOTE) Pre  diabetes:          5.7%-6.4% Diabetes:              >6.4% Glycemic control for   <7.0% adults with diabetes   01/23/2016 11:51 AM 8.3 (H) 4.8 - 5.6 % Final    Comment:    (NOTE)         Pre-diabetes: 5.7 - 6.4         Diabetes: >6.4         Glycemic control for adults with diabetes: <7.0     CBG: Recent Labs  Lab 05/28/19 2328 05/29/19 0001 05/29/19 0322 05/29/19 0738 05/29/19 1140  GLUCAP 58* 72 67* 53* 99    Critical care time: 32 min    The patient is critically ill with multiple organ systems failure and requires high complexity decision making for assessment and support, frequent evaluation and titration of therapies, application of advanced monitoring technologies and extensive interpretation of multiple databases.   Rodman Pickle, M.D. [facility] Pulmonary/Critical Care Medicine 05/29/2019 12:53 PM  Pager: [phone] After hours pager: [phone]

## 2019-05-29 NOTE — Progress Notes (Signed)
Subjective:  CRRT got restarted overnight for concern of continued volume overload- is running well-  o2 req down    Objective Vital signs in last 24 hours: Vitals:   05/29/19 0500 05/29/19 0600 05/29/19 0700 05/29/19 0800  BP: 117/79 128/70 131/68 138/81  Pulse: 88 88 88 100  Resp: 17 17 18 19   Temp:   98.1 F (36.7 C)   TempSrc:   Oral   SpO2: 100% 100% 100% 100%  Weight: 77.1 kg     Height:       Weight change: -2.3 kg  Intake/Output Summary (Last 24 hours) at 05/29/2019 0813 Last data filed at 05/29/2019 0800 Gross per 24 hour  Intake 562.99 ml  Output 1420 ml  Net -857.01 ml    Dialysis Orders: Center: [place]  on MWF . EDW 86.5 HD Bath 3k, 2ca  Time  4hr75min  Access L  IJ  Perm cath   Heparin 6000.  Calcitriol 0.48mcg po q hd    Venofer  50 mg  q hd    No ESA = Last OP HGB 13.9  05/20/19   Assessment/ Plan: Pt is a 58 y.o. yo male with ESRD who was admitted on 05/25/2019 with hypotension and hypoxia related to transudative pleural effusions  Assessment/Plan: 1. Pulm-  Per CCM- O2 req less with UF- on zosyn prior but now stopped-  Pleural effusions possibly secondary to volume overload - see plan below  2. ESRD - normally MWF at [facility].  Had incident after HD on Monday- got hypotensive after tx.  Now started on CRRT given volume overload and hypotension- ran from late 11/17 to 11/19.  Was going to stop crrt but got restarted for continued volume removal - will inc to 200 per hour removal  3. Anemia- not an issue at this time 4. Secondary hyperparathyroidism- look to resume calcitriol and binder when back on IHD - right now phos is low 5. HTN/volume-  Had been on multiple antihypertensives as an OP-  Those have all been stopped.  Req transient pressor support and midodrine - BP better.    Is much under EDW by bed weight   Shawn [name]    Labs: Basic Metabolic Panel: Recent Labs  Lab 05/28/19 0144 05/28/19 1620 05/29/19 0435  NA 135 133* 136  K  4.0 4.2 4.3  CL 101 102 100  CO2 25 20* 23  GLUCOSE 82 71 61*  BUN 8 10 11   CREATININE 2.21* 2.58* 2.13*  CALCIUM 8.4* 8.4* 8.9  PHOS 2.1* 2.3* 2.0*   Liver Function Tests: Recent Labs  Lab 05/25/19 1216  05/28/19 0144 05/28/19 1620 05/29/19 0435  AST 23  --   --   --   --   ALT 15  --   --   --   --   ALKPHOS 103  --   --   --   --   BILITOT 1.4*  --   --   --   --   PROT 7.1  --   --   --   --   ALBUMIN 3.5   < > 2.8* 2.8* 3.0*   < > = values in this interval not displayed.   No results for input(s): LIPASE, AMYLASE in the last 168 hours. Recent Labs  Lab 05/25/19 1217  AMMONIA 41*   CBC: Recent Labs  Lab 05/25/19 1115  05/26/19 0749  05/27/19 0042 05/27/19 2049 05/28/19 0144 05/29/19 0435  WBC 6.2  --  12.4*  --  11.7*  --  11.5* 9.6  NEUTROABS 4.8  --   --   --  10.0*  --  9.3* 7.7  HGB 10.1*   < > 14.7   < > 14.2 15.6 13.4 13.4  HCT 34.2*   < > 47.4   < > 45.0 46.0 42.6 42.4  MCV 100.9*  --  96.5  --  94.7  --  94.5 95.3  PLT 146*  --  302  --  263  --  211 181   < > = values in this interval not displayed.   Cardiac Enzymes: No results for input(s): CKTOTAL, CKMB, CKMBINDEX, TROPONINI in the last 168 hours. CBG: Recent Labs  Lab 05/28/19 1946 05/28/19 2328 05/29/19 0001 05/29/19 0322 05/29/19 0738  GLUCAP 74 58* 72 67* 53*    Iron Studies: No results for input(s): IRON, TIBC, TRANSFERRIN, FERRITIN in the last 72 hours. Studies/Results: Ct Chest Wo Contrast  Result Date: 05/28/2019 CLINICAL DATA:  Pleural effusion, evaluate for chylothorax EXAM: CT CHEST WITHOUT CONTRAST TECHNIQUE: Multidetector CT imaging of the chest was performed following the standard protocol without IV contrast. COMPARISON:  CT 01/26/2010 FINDINGS: Cardiovascular: Right IJ approach central venous catheter tip terminates in the right atrium. Heart size at the upper limits of normal. Few coronary artery calcifications are present. Calcifications present upon the aortic  leaflets. Aortic caliber is normal. Calcifications are present in the aortic arch. Normal 3 vessel branching pattern of the arch. Central pulmonary artery caliber is upper limits normal size. Luminal evaluation precluded in the absence of contrast. There is prominence of the azygous veins. Mediastinum/Nodes: Multiple enlarged mediastinal nodes are present including a 15 mm lower paratracheal/azygos lymph node (3/68) and a conglomerate subcarinal nodal mass measuring up to 2.1 cm (3/89). No acute abnormality of the trachea or esophagus. Mild heterogeneity of thyroid gland. Lungs/Pleura: There is large volume bilateral pleural effusions measuring relatively simple attenuation (0-5 HU). There is associated atelectatic collapse of the right middle and lower lobes and left lower lobe and partial atelectatic collapse of both upper lobes as well. Underlying consolidation or mass lesion is not excluded. Upper Abdomen: No acute abnormalities present in the visualized portions of the upper abdomen. Musculoskeletal: No acute osseous abnormality or suspicious osseous lesion. Multilevel degenerative changes are present in the imaged portions of the spine. Degenerative changes in both shoulders. Diffuse body wall edema. Moderate right gynecomastia. IMPRESSION: 1. Large volume bilateral pleural effusions with associated atelectatic collapse of the right middle and lower lobes and left lower lobe and partial atelectatic collapse of both upper lobes. Underlying consolidation or mass lesion is not excluded. The composition of these simple density effusions could be determined by fluid sampling. 2. Multiple enlarged mediastinal nodes, nonspecific. 3. Moderate circumferential body wall edema, correlate for features of anasarca. 4. Gynecomastia. 5. Aortic Atherosclerosis (ICD10-I70.0). Electronically Signed   By: Lovena Le M.D.   On: 05/28/2019 15:34   Medications: Infusions: .  prismasol BGK 4/2.5 500 mL/hr at 05/29/19 [id number]  .   prismasol BGK 4/2.5 500 mL/hr at 05/29/19 [id number]  . prismasol BGK 4/2.5 2,000 mL/hr at 05/29/19 [id number]    Scheduled Medications: . aspirin  325 mg Oral Daily  . brimonidine  1 drop Left Eye BID  . Chlorhexidine Gluconate Cloth  6 each Topical Daily  . Chlorhexidine Gluconate Cloth  6 each Topical Q0600  . dorzolamide-timolol  1 drop Left Eye BID  . heparin  5,000 Units Subcutaneous Q8H  .  latanoprost  1 drop Left Eye QHS  . midodrine  10 mg Oral TID WC  . polyethylene glycol  17 g Oral QHS    have reviewed scheduled and prn medications.  Physical Exam: General: more alert, got restarted on CRRT last night  Heart: RRR Lungs: dec BS at bases  Abdomen: soft, non tender Extremities: pitting edema- better  Dialysis Access: TDC    05/29/2019,8:13 AM  LOS: 4 days

## 2019-05-30 DIAGNOSIS — I10 Essential (primary) hypertension: Secondary | ICD-10-CM

## 2019-05-30 LAB — CBC WITH DIFFERENTIAL/PLATELET
Abs Immature Granulocytes: 0.02 10*3/uL (ref 0.00–0.07)
Basophils Absolute: 0 10*3/uL (ref 0.0–0.1)
Basophils Relative: 0 %
Eosinophils Absolute: 0.2 10*3/uL (ref 0.0–0.5)
Eosinophils Relative: 3 %
HCT: 42.1 % (ref 39.0–52.0)
Hemoglobin: 13.4 g/dL (ref 13.0–17.0)
Immature Granulocytes: 0 %
Lymphocytes Relative: 5 %
Lymphs Abs: 0.3 10*3/uL — ABNORMAL LOW (ref 0.7–4.0)
MCH: 29.5 pg (ref 26.0–34.0)
MCHC: 31.8 g/dL (ref 30.0–36.0)
MCV: 92.7 fL (ref 80.0–100.0)
Monocytes Absolute: 0.9 10*3/uL (ref 0.1–1.0)
Monocytes Relative: 15 %
Neutro Abs: 4.7 10*3/uL (ref 1.7–7.7)
Neutrophils Relative %: 77 %
Platelets: 161 10*3/uL (ref 150–400)
RBC: 4.54 MIL/uL (ref 4.22–5.81)
RDW: 17.6 % — ABNORMAL HIGH (ref 11.5–15.5)
WBC: 6 10*3/uL (ref 4.0–10.5)
nRBC: 0 % (ref 0.0–0.2)

## 2019-05-30 LAB — RENAL FUNCTION PANEL
Albumin: 2.9 g/dL — ABNORMAL LOW (ref 3.5–5.0)
Anion gap: 10 (ref 5–15)
BUN: 7 mg/dL (ref 6–20)
CO2: 22 mmol/L (ref 22–32)
Calcium: 8.7 mg/dL — ABNORMAL LOW (ref 8.9–10.3)
Chloride: 102 mmol/L (ref 98–111)
Creatinine, Ser: 1.5 mg/dL — ABNORMAL HIGH (ref 0.61–1.24)
GFR calc Af Amer: 59 mL/min — ABNORMAL LOW (ref >60)
GFR calc non Af Amer: 51 mL/min — ABNORMAL LOW (ref >60)
Glucose, Bld: 97 mg/dL (ref 70–99)
Phosphorus: 1.2 mg/dL — ABNORMAL LOW (ref 2.5–4.6)
Potassium: 4.1 mmol/L (ref 3.5–5.1)
Sodium: 134 mmol/L — ABNORMAL LOW (ref 135–145)

## 2019-05-30 LAB — CULTURE, BLOOD (ROUTINE X 2)
Culture: NO GROWTH
Culture: NO GROWTH
Special Requests: ADEQUATE
Special Requests: ADEQUATE

## 2019-05-30 LAB — GLUCOSE, CAPILLARY
Glucose-Capillary: 82 mg/dL (ref 70–99)
Glucose-Capillary: 79 mg/dL (ref 70–99)
Glucose-Capillary: 149 mg/dL — ABNORMAL HIGH (ref 70–99)
Glucose-Capillary: 168 mg/dL — ABNORMAL HIGH (ref 70–99)
Glucose-Capillary: 203 mg/dL — ABNORMAL HIGH (ref 70–99)

## 2019-05-30 LAB — MAGNESIUM: Magnesium: 2.4 mg/dL (ref 1.7–2.4)

## 2019-05-30 LAB — APTT: aPTT: 58 seconds — ABNORMAL HIGH (ref 24–36)

## 2019-05-30 MED ORDER — METOPROLOL TARTRATE 25 MG PO TABS
25.0000 mg | ORAL_TABLET | Freq: Two times a day (BID) | ORAL | Status: DC
  Administered 2019-05-30 – 2019-06-01 (×4): 25 mg via ORAL
  Filled 2019-05-30 (×3): qty 1
  Filled 2019-05-30: qty 2

## 2019-05-30 MED ORDER — CHLORHEXIDINE GLUCONATE CLOTH 2 % EX PADS
6.0000 | MEDICATED_PAD | Freq: Every day | CUTANEOUS | Status: DC
  Administered 2019-05-31 – 2019-06-01 (×2): 6 via TOPICAL

## 2019-05-30 MED ORDER — SODIUM PHOSPHATES 45 MMOLE/15ML IV SOLN
30.0000 mmol | Freq: Once | INTRAVENOUS | Status: AC
  Administered 2019-05-30: 09:00:00 30 mmol via INTRAVENOUS
  Filled 2019-05-30: qty 10

## 2019-05-30 NOTE — Progress Notes (Signed)
Subjective:  BP is much improved-  Total removed 4 liters with CRRT last 24 hours - now on room air and reports that he had cramping overnight    Objective Vital signs in last 24 hours: Vitals:   05/30/19 0300 05/30/19 0400 05/30/19 0500 05/30/19 0600  BP: (!) 145/78 (!) 149/75 (!) 157/85 (!) 144/85  Pulse: 81 83 93 87  Resp: 18 20 (!) 21 20  Temp:  97.6 F (36.4 C)    TempSrc:  Oral    SpO2: 97% 96% 96% 96%  Weight:   73.5 kg   Height:       Weight change: -3.6 kg  Intake/Output Summary (Last 24 hours) at 05/30/2019 [id number] Last data filed at 05/30/2019 0600 Gross per 24 hour  Intake 377 ml  Output 4693 ml  Net -4316 ml    Dialysis Orders: Center: [place]  on MWF . EDW 86.5 HD Bath 3k, 2ca  Time  4hr10min  Access L  IJ  Perm cath   Heparin 6000.  Calcitriol 0.31mcg po q hd    Venofer  50 mg  q hd    No ESA = Last OP HGB 13.9  05/20/19   Assessment/ Plan: Pt is a 58 y.o. yo male with ESRD who was admitted on 05/25/2019 with hypotension and hypoxia related to transudative pleural effusions  Assessment/Plan: 1. Pulm-  Per CCM- O2 req less with UF- on zosyn prior but now stopped-  Pleural effusions possibly secondary to volume overload - see plan below  2. ESRD - normally MWF at [facility] via [facility].  Had incident after HD on Monday- got hypotensive after tx.  Now started on CRRT given volume overload and hypotension- ran from late 11/17 to 11/19.   got restarted late 11/19 for continued volume removal - 200 per hour removal -  With BP still relatively high I think we have more to get- will continue CRRT today with plans to stop when convenient today.  Tomorrow would be regular dialysis day due to holiday schedule for MWF - is Sun/Tues/Fri 3. Anemia- not an issue at this time 4. Secondary hyperparathyroidism- look to resume calcitriol and binder when back on IHD - right now phos is low--  Needs repletion  5. HTN/volume-  Had been on multiple antihypertensives as an OP-  Those have all been  stopped.  Req transient pressor support and midodrine, now off  - BP better.    Is MUCH under EDW by bed weight   Shawn [name]    Labs: Basic Metabolic Panel: Recent Labs  Lab 05/29/19 0435 05/29/19 1552 05/30/19 0350  NA 136 133* 134*  K 4.3 4.3 4.1  CL 100 100 102  CO2 23 23 22   GLUCOSE 61* 106* 97  BUN 11 7 7   CREATININE 2.13* 1.78* 1.50*  CALCIUM 8.9 8.7* 8.7*  PHOS 2.0* 1.9* 1.2*   Liver Function Tests: Recent Labs  Lab 05/25/19 1216  05/29/19 0435 05/29/19 1552 05/30/19 0350  AST 23  --   --   --   --   ALT 15  --   --   --   --   ALKPHOS 103  --   --   --   --   BILITOT 1.4*  --   --   --   --   PROT 7.1  --   --   --   --   ALBUMIN 3.5   < > 3.0* 3.0* 2.9*   < > =  values in this interval not displayed.   No results for input(s): LIPASE, AMYLASE in the last 168 hours. Recent Labs  Lab 05/25/19 1217  AMMONIA 41*   CBC: Recent Labs  Lab 05/26/19 0749  05/27/19 0042  05/28/19 0144 05/29/19 0435 05/30/19 0350  WBC 12.4*  --  11.7*  --  11.5* 9.6 6.0  NEUTROABS  --   --  10.0*  --  9.3* 7.7 4.7  HGB 14.7   < > 14.2   < > 13.4 13.4 13.4  HCT 47.4   < > 45.0   < > 42.6 42.4 42.1  MCV 96.5  --  94.7  --  94.5 95.3 92.7  PLT 302  --  263  --  211 181 161   < > = values in this interval not displayed.   Cardiac Enzymes: No results for input(s): CKTOTAL, CKMB, CKMBINDEX, TROPONINI in the last 168 hours. CBG: Recent Labs  Lab 05/29/19 1140 05/29/19 1556 05/29/19 2024 05/29/19 2323 05/30/19 0448  GLUCAP 99 109* 151* 136* 82    Iron Studies: No results for input(s): IRON, TIBC, TRANSFERRIN, FERRITIN in the last 72 hours. Studies/Results: Dg Chest 1 View  Result Date: 05/29/2019 CLINICAL DATA:  Shortness of breath EXAM: CHEST  1 VIEW COMPARISON:  05/26/2019 FINDINGS: Small bilateral pleural effusions, left greater than right. Bilateral diffuse interstitial thickening. No pneumothorax. Stable cardiomediastinal silhouette. Large bore  right-sided central venous catheter with the tip projecting over the right atrium. No acute osseous abnormality. IMPRESSION: Bilateral pleural effusions, left greater than right with bilateral interstitial thickening. Electronically Signed   By: Kathreen Devoid   On: 05/29/2019 09:07   Ct Chest Wo Contrast  Result Date: 05/28/2019 CLINICAL DATA:  Pleural effusion, evaluate for chylothorax EXAM: CT CHEST WITHOUT CONTRAST TECHNIQUE: Multidetector CT imaging of the chest was performed following the standard protocol without IV contrast. COMPARISON:  CT 01/26/2010 FINDINGS: Cardiovascular: Right IJ approach central venous catheter tip terminates in the right atrium. Heart size at the upper limits of normal. Few coronary artery calcifications are present. Calcifications present upon the aortic leaflets. Aortic caliber is normal. Calcifications are present in the aortic arch. Normal 3 vessel branching pattern of the arch. Central pulmonary artery caliber is upper limits normal size. Luminal evaluation precluded in the absence of contrast. There is prominence of the azygous veins. Mediastinum/Nodes: Multiple enlarged mediastinal nodes are present including a 15 mm lower paratracheal/azygos lymph node (3/68) and a conglomerate subcarinal nodal mass measuring up to 2.1 cm (3/89). No acute abnormality of the trachea or esophagus. Mild heterogeneity of thyroid gland. Lungs/Pleura: There is large volume bilateral pleural effusions measuring relatively simple attenuation (0-5 HU). There is associated atelectatic collapse of the right middle and lower lobes and left lower lobe and partial atelectatic collapse of both upper lobes as well. Underlying consolidation or mass lesion is not excluded. Upper Abdomen: No acute abnormalities present in the visualized portions of the upper abdomen. Musculoskeletal: No acute osseous abnormality or suspicious osseous lesion. Multilevel degenerative changes are present in the imaged portions  of the spine. Degenerative changes in both shoulders. Diffuse body wall edema. Moderate right gynecomastia. IMPRESSION: 1. Large volume bilateral pleural effusions with associated atelectatic collapse of the right middle and lower lobes and left lower lobe and partial atelectatic collapse of both upper lobes. Underlying consolidation or mass lesion is not excluded. The composition of these simple density effusions could be determined by fluid sampling. 2. Multiple enlarged mediastinal nodes, nonspecific. 3. Moderate  circumferential body wall edema, correlate for features of anasarca. 4. Gynecomastia. 5. Aortic Atherosclerosis (ICD10-I70.0). Electronically Signed   By: Lovena Le M.D.   On: 05/28/2019 15:34   Medications: Infusions: .  prismasol BGK 4/2.5 500 mL/hr at 05/30/19 0300  .  prismasol BGK 4/2.5 500 mL/hr at 05/30/19 0300  . prismasol BGK 4/2.5 2,000 mL/hr at 05/30/19 [id number]    Scheduled Medications: . aspirin  325 mg Oral Daily  . brimonidine  1 drop Both Eyes BID  . Chlorhexidine Gluconate Cloth  6 each Topical Daily  . Chlorhexidine Gluconate Cloth  6 each Topical Q0600  . dorzolamide-timolol  1 drop Both Eyes BID  . heparin  5,000 Units Subcutaneous Q8H  . latanoprost  1 drop Both Eyes QHS  . polyethylene glycol  17 g Oral QHS    have reviewed scheduled and prn medications.  Physical Exam: General: more alert,  CRRT running-  He reports cramping  Heart: RRR Lungs: dec BS at bases  Abdomen: soft, non tender Extremities: pitting edema- better  Dialysis Access: TDC    05/30/2019,6:39 AM  LOS: 5 days

## 2019-05-30 NOTE — Progress Notes (Signed)
NAME:  Arseny Winschel, MRN:  [id number], DOB:  1960/07/23, LOS: 5 ADMISSION DATE:  05/25/2019, CONSULTATION DATE:  05/25/19 REFERRING MD:  ER, CHIEF COMPLAINT:  AMS   Brief History   58 year old man with hx of HD dependent ESRD presenting with syncopal episode in HD.  History of present illness   58 year old man with hx of HD dependent ESRD presenting with syncopal episode in HD.  Noted to be bradycardic, hypoatnsive brought to ER externally paced.  Seen in ER, pacing turned off and Bps stabilized.  Noted to be hypoxemic and hypercarbic.  PCCM asked to admit.  Past Medical History  CVA, DM, HTN, ESRD, Blind  Significant Hospital Events   11/16 Admitted 11/17 CRRT started, BiPAP temporarily for pure resp acidosis.   Consults:  Cardiology  Procedures:  11/16 R thoracentesis  Significant Diagnostic Tests:  CXR bilateral effusions, R>L CT chest 11/18 >  Micro Data:  Pleural fluid 11/18 Blood cx 11/16 > COVID > negative  Antimicrobials:  Vanc 11/16  > 11/17 Zosyn 11/16 > 11/18  Interim history/subjective:  BiPAP overnight but improving, no further events overnight  Objective   Blood pressure (!) 151/78, pulse 92, temperature 97.6 F (36.4 C), temperature source Oral, resp. rate 20, height 6' (1.829 m), weight 73.5 kg, SpO2 95 %.        Intake/Output Summary (Last 24 hours) at 05/30/2019 0752 Last data filed at 05/30/2019 0700 Gross per 24 hour  Intake 377 ml  Output 4822 ml  Net -4445 ml   Filed Weights   05/28/19 0415 05/29/19 0500 05/30/19 0500  Weight: 79.4 kg 77.1 kg 73.5 kg   Physical Exam: General: Well appearing, NAD HENT: [place]/AT, PERRL, EOM-I and MMM Respiratory: Clear bilaterally Cardiovascular: RRR, Nl S1/S2 and -M/R/G GI: Soft, NT, ND and +BS Extremities: -edema and -tenderness Neuro: Alert and interactive, moving all ext to command Skin: Intact, no rashes or bruising  I reviewed CXR myself, pulmonary infiltrate noted  Resolved Hospital  Problem list   NA  Assessment & Plan:   Acute hypercarbic respiratory failure: extensive volume overload in the setting of large bilateral pleural effusions. 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 CT chest with lymphadenopathy however may be reactive in setting of volume overload. - Pleural cultures and cytology negative - Will consider CT after euvolemia  ESRD on HD - Switch to standard HD - D/C pressors  Shock-like episode in HD, vasoplegia vs. Chronotropic insufficiency with question of sepsis. Briefly required pressor support on 11/17. - Start lopressor 25 PO BID  Will hold in the ICU to D/C BiPAP and CRRT, if tolerated will transfer out of the ICU and to [facility] with PCCM off 11/22.  Best practice:  Diet: Renal diet Pain/Anxiety/Delirium protocol (if indicated): None VAP protocol SQH GI prophylaxis: NA Glucose control: SSI Mobility: BR Code Status: Full Family Communication: Patient updated 11/20 Disposition:  ICU  Labs   CBC: Recent Labs  Lab 05/25/19 1115  05/26/19 0749  05/27/19 0042 05/27/19 2049 05/28/19 0144 05/29/19 0435 05/30/19 0350  WBC 6.2  --  12.4*  --  11.7*  --  11.5* 9.6 6.0  NEUTROABS 4.8  --   --   --  10.0*  --  9.3* 7.7 4.7  HGB 10.1*   < > 14.7   < >  14.2 15.6 13.4 13.4 13.4  HCT 34.2*   < > 47.4   < > 45.0 46.0 42.6 42.4 42.1  MCV 100.9*  --  96.5  --  94.7  --  94.5 95.3 92.7  PLT 146*  --  302  --  263  --  211 181 161   < > = values in this interval not displayed.   Basic Metabolic Panel: Recent Labs  Lab 05/25/19 1216  05/27/19 0042  05/28/19 0144 05/28/19 1620 05/29/19 0435 05/29/19 1552 05/30/19 0350  NA 134*   < > 136   < > 135 133* 136 133* 134*  K 4.1   < > 4.4   < > 4.0 4.2 4.3 4.3 4.1  CL 92*   < > 97*   < > 101 102 100 100 102  CO2 27   < > 26   < > 25 20* 23 23  22   GLUCOSE 156*   < > 118*   < > 82 71 61* 106* 97  BUN 9   < > 14   < > 8 10 11 7 7   CREATININE 3.82*   < > 3.57*   < > 2.21* 2.58* 2.13* 1.78* 1.50*  CALCIUM 8.4*   < > 8.6*   < > 8.4* 8.4* 8.9 8.7* 8.7*  MG 2.4  --  2.3  --  2.5*  --   --   --  2.4  PHOS  --    < > 3.5   < > 2.1* 2.3* 2.0* 1.9* 1.2*   < > = values in this interval not displayed.   GFR: Estimated Creatinine Clearance: 55.8 mL/min (A) (by C-G formula based on SCr of 1.5 mg/dL (H)). Recent Labs  Lab 05/25/19 1109  05/25/19 1217  05/27/19 0042 05/28/19 0144 05/29/19 0435 05/30/19 0350  PROCALCITON  --   --   --   --  0.28 0.27  --   --   WBC  --    < >  --    < > 11.7* 11.5* 9.6 6.0  LATICACIDVEN 2.7*  --  2.2*  --  1.9  --   --   --    < > = values in this interval not displayed.   Liver Function Tests: Recent Labs  Lab 05/25/19 1216  05/28/19 0144 05/28/19 1620 05/29/19 0435 05/29/19 1552 05/30/19 0350  AST 23  --   --   --   --   --   --   ALT 15  --   --   --   --   --   --   ALKPHOS 103  --   --   --   --   --   --   BILITOT 1.4*  --   --   --   --   --   --   PROT 7.1  --   --   --   --   --   --   ALBUMIN 3.5   < > 2.8* 2.8* 3.0* 3.0* 2.9*   < > = values in this interval not displayed.   No results for input(s): LIPASE, AMYLASE in the last 168 hours. Recent Labs  Lab 05/25/19 1217  AMMONIA 41*   ABG    Component Value Date/Time   PHART 7.243 (L) 05/27/2019 2049   PCO2ART 61.8 (H) 05/27/2019 2049   PO2ART 123.0 (H) 05/27/2019 2049   HCO3 26.7 05/27/2019 2049  TCO2 29 05/27/2019 2049   ACIDBASEDEF 2.0 05/27/2019 2049   O2SAT 98.0 05/27/2019 2049   Coagulation Profile: Recent Labs  Lab 05/25/19 1216  INR 1.4*   Cardiac Enzymes: No results for input(s): CKTOTAL, CKMB, CKMBINDEX, TROPONINI in the last 168 hours.  HbA1C: Hgb A1c MFr Bld  Date/Time Value Ref Range Status  12/21/2017 05:14 PM 7.5 (H) 4.8 - 5.6 % Final    Comment:    (NOTE) Pre diabetes:          5.7%-6.4%  Diabetes:              >6.4% Glycemic control for   <7.0% adults with diabetes   01/23/2016 11:51 AM 8.3 (H) 4.8 - 5.6 % Final    Comment:    (NOTE)         Pre-diabetes: 5.7 - 6.4         Diabetes: >6.4         Glycemic control for adults with diabetes: <7.0    CBG: Recent Labs  Lab 05/29/19 1140 05/29/19 1556 05/29/19 2024 05/29/19 2323 05/30/19 0448  GLUCAP 99 109* 151* 136* 82   The patient is critically ill with multiple organ systems failure and requires high complexity decision making for assessment and support, frequent evaluation and titration of therapies, application of advanced monitoring technologies and extensive interpretation of multiple databases.   Critical Care Time devoted to patient care services described in this note is  32  Minutes. This time reflects time of care of this signee Dr Jennet Maduro. This critical care time does not reflect procedure time, or teaching time or supervisory time of PA/NP/Med student/Med Resident etc but could involve care discussion time.  Rush Farmer, M.D. [facility] Pulmonary/Critical Care Medicine.

## 2019-05-30 NOTE — Progress Notes (Signed)
Pt was transferred from 74M.  Pt is alert, oriented, blind.  Seated in the chair for comfort, he says his bottom hurts because he has a sore on it.  Pt refuses to move to bed for assessment of it.

## 2019-05-31 LAB — GLUCOSE, CAPILLARY
Glucose-Capillary: 125 mg/dL — ABNORMAL HIGH (ref 70–99)
Glucose-Capillary: 141 mg/dL — ABNORMAL HIGH (ref 70–99)
Glucose-Capillary: 152 mg/dL — ABNORMAL HIGH (ref 70–99)
Glucose-Capillary: 170 mg/dL — ABNORMAL HIGH (ref 70–99)
Glucose-Capillary: 84 mg/dL (ref 70–99)

## 2019-05-31 LAB — BASIC METABOLIC PANEL
Anion gap: 9 (ref 5–15)
BUN: 13 mg/dL (ref 6–20)
CO2: 25 mmol/L (ref 22–32)
Calcium: 9 mg/dL (ref 8.9–10.3)
Chloride: 101 mmol/L (ref 98–111)
Creatinine, Ser: 2.61 mg/dL — ABNORMAL HIGH (ref 0.61–1.24)
GFR calc Af Amer: 30 mL/min — ABNORMAL LOW (ref >60)
GFR calc non Af Amer: 26 mL/min — ABNORMAL LOW (ref >60)
Glucose, Bld: 156 mg/dL — ABNORMAL HIGH (ref 70–99)
Potassium: 3.7 mmol/L (ref 3.5–5.1)
Sodium: 135 mmol/L (ref 135–145)

## 2019-05-31 LAB — CBC
HCT: 39.1 % (ref 39.0–52.0)
Hemoglobin: 13 g/dL (ref 13.0–17.0)
MCH: 30.1 pg (ref 26.0–34.0)
MCHC: 33.2 g/dL (ref 30.0–36.0)
MCV: 90.5 fL (ref 80.0–100.0)
Platelets: 174 10*3/uL (ref 150–400)
RBC: 4.32 MIL/uL (ref 4.22–5.81)
RDW: 17.2 % — ABNORMAL HIGH (ref 11.5–15.5)
WBC: 6.7 10*3/uL (ref 4.0–10.5)
nRBC: 0 % (ref 0.0–0.2)

## 2019-05-31 LAB — MAGNESIUM: Magnesium: 2.4 mg/dL (ref 1.7–2.4)

## 2019-05-31 LAB — PHOSPHORUS: Phosphorus: 2.4 mg/dL — ABNORMAL LOW (ref 2.5–4.6)

## 2019-05-31 MED ORDER — ACETAMINOPHEN 325 MG PO TABS
ORAL_TABLET | ORAL | Status: AC
  Administered 2019-05-31: 11:00:00
  Filled 2019-05-31: qty 2

## 2019-05-31 MED ORDER — CALCITRIOL 0.25 MCG PO CAPS
0.5000 ug | ORAL_CAPSULE | ORAL | Status: DC
  Administered 2019-05-31 – 2019-06-01 (×2): 0.5 ug via ORAL
  Filled 2019-05-31 (×2): qty 2

## 2019-05-31 MED ORDER — HEPARIN SODIUM (PORCINE) 1000 UNIT/ML IJ SOLN
INTRAMUSCULAR | Status: AC
  Administered 2019-05-31: 12:00:00
  Filled 2019-05-31: qty 6

## 2019-05-31 MED ORDER — PRO-STAT SUGAR FREE PO LIQD
30.0000 mL | Freq: Two times a day (BID) | ORAL | Status: DC
  Administered 2019-05-31 – 2019-06-01 (×3): 30 mL via ORAL
  Filled 2019-05-31 (×3): qty 30

## 2019-05-31 MED ORDER — HEPARIN SODIUM (PORCINE) 1000 UNIT/ML DIALYSIS
20.0000 [IU]/kg | INTRAMUSCULAR | Status: DC | PRN
  Administered 2019-05-31: 09:00:00 1500 [IU] via INTRAVENOUS_CENTRAL

## 2019-05-31 NOTE — Progress Notes (Signed)
Patient ID: Shawn [name], male   DOB: February 06, 1961, 58 y.o.   MRN: [id number]  PROGRESS NOTE    Shawn [name]  [id number] DOB: 01/27/1961 DOA: 05/25/2019 PCP: Vincente Liberty, MD   Brief Narrative:  58 year old male with history of end-stage renal disease on hemodialysis, diabetes mellitus type 2, hypertension, unspecified CVA, blindness in the right eye presented with syncopal episode in hemodialysis.  He was found to be bradycardic, hypotensive and was brought to the ER externally paced.  In the ED, pacing turned off and blood pressure stabilized.  Cardiology was consulted who subsequently signed off.  He was noted to be hypoxemic and hypercarbic secondary to fluid overload and was admitted to ICU under PCCM service.  Nephrology was consulted.  Required pressors initially.  Was initially started on CRRT and required BiPAP initially.  Underwent right-sided thoracentesis on 05/25/2019.  Covid testing was negative.  Initially started on broad-spectrum antibiotics; Zosyn was subsequently discontinued on 05/27/2019.  Pressors were subsequently discontinued.  He was transferred to [facility] service on 05/31/2019.  Assessment & Plan:   Acute hypercarbic respiratory failure Bilateral pleural effusions -Most likely in the setting of extensive volume overload -Initially required BiPAP. -Status post right-sided thoracentesis on 05/27/2019; initially required CRRT for volume removal.  Initially treated with broad-spectrum antibiotics but subsequently discontinued on 05/27/2019. -Currently respiratory status stable and on room air.  Transferred to [facility] service on 05/31/2019.  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  CT chest with lymphadenopathy, however may be reactive in the setting of volume overload.  Fluid cultures and cytology negative.  Might need repeat CT as an outpatient and outpatient follow-up with pulmonary.  Syncope Shock: unspecified Brief episode of bradycardia -Patient  presented with syncope and was found to be bradycardic requiring external pacing.  Subsequently in the ED, pacer was turned off.  Cardiology was consulted and they have signed off.  Patient initially required pressors which have been subsequently discontinued.  Heart rate stable now.  Continue metoprolol.  Outpatient follow-up with cardiology. -Cultures negative so far  Hypertension -On multiple antihypertensives as an outpatient.  Required transient pressor support and midodrine, now off. -Currently on metoprolol.  Blood pressure stable.  Generalized deconditioning -PT eval  Diabetes mellitus type 2  -CBGs with SSI.   DVT prophylaxis: Heparin Code Status: Full Family Communication: Spoke to patient at bedside Disposition Plan: Pending PT evaluation  Consultants: PCCM/nephrology  Procedures:  Right-sided thoracentesis on 05/27/2019 CRRT Antimicrobials:  Anti-infectives (From admission, onward)   Start     Dose/Rate Route Frequency Ordered Stop   05/27/19 1200  vancomycin (VANCOCIN) IVPB 1000 mg/200 mL premix  Status:  Discontinued     1,000 mg 200 mL/hr over 60 Minutes Intravenous Every M-W-F (Hemodialysis) 05/25/19 1536 05/26/19 1130   05/26/19 1400  piperacillin-tazobactam (ZOSYN) IVPB 3.375 g  Status:  Discontinued     3.375 g 12.5 mL/hr over 240 Minutes Intravenous Every 6 hours 05/26/19 1132 05/26/19 1135   05/26/19 1400  piperacillin-tazobactam (ZOSYN) IVPB 3.375 g  Status:  Discontinued     3.375 g 100 mL/hr over 30 Minutes Intravenous Every 6 hours 05/26/19 1135 05/27/19 1044   05/25/19 1545  piperacillin-tazobactam (ZOSYN) IVPB 2.25 g  Status:  Discontinued     2.25 g 100 mL/hr over 30 Minutes Intravenous Every 8 hours 05/25/19 1536 05/26/19 1132   05/25/19 1545  vancomycin (VANCOCIN) 2,500 mg in sodium chloride 0.9 % 500 mL IVPB     2,500 mg 250 mL/hr over 120 Minutes Intravenous  Once 05/25/19 1536  05/25/19 1905   05/25/19 1345  cefTRIAXone (ROCEPHIN) 2 g in  sodium chloride 0.9 % 100 mL IVPB  Status:  Discontinued     2 g 200 mL/hr over 30 Minutes Intravenous Every 24 hours 05/25/19 1331 05/25/19 1536   05/25/19 1345  doxycycline (VIBRAMYCIN) 100 mg in sodium chloride 0.9 % 250 mL IVPB  Status:  Discontinued     100 mg 125 mL/hr over 120 Minutes Intravenous  Once 05/25/19 1331 05/25/19 1536       Subjective: Patient seen and examined at bedside on hemodialysis.  Denies any overnight fever, nausea, worsening shortness breath.  Objective: Vitals:   05/31/19 0820 05/31/19 0850 05/31/19 0920 05/31/19 0950  BP: 110/66 103/64 107/63 125/66  Pulse: 89 90 89 88  Resp: 14 16 18 16   Temp:      TempSrc:      SpO2:      Weight:      Height:        Intake/Output Summary (Last 24 hours) at 05/31/2019 1019 Last data filed at 05/30/2019 1400 Gross per 24 hour  Intake 403.33 ml  Output -  Net 403.33 ml   Filed Weights   05/29/19 0500 05/30/19 0500 05/31/19 0710  Weight: 77.1 kg 73.5 kg 75.9 kg    Examination:  General exam: Appears calm and comfortable.  Looks older than stated age. Respiratory system: Bilateral decreased breath sounds at bases with scattered crackles Cardiovascular system: S1 & S2 heard, Rate controlled Gastrointestinal system: Abdomen is nondistended, soft and nontender. Normal bowel sounds heard. Extremities: No cyanosis, clubbing; trace lower extremity edema   Data Reviewed: I have personally reviewed following labs and imaging studies  CBC: Recent Labs  Lab 05/25/19 1115  05/27/19 0042 05/27/19 2049 05/28/19 0144 05/29/19 0435 05/30/19 0350 05/31/19 0241  WBC 6.2   < > 11.7*  --  11.5* 9.6 6.0 6.7  NEUTROABS 4.8  --  10.0*  --  9.3* 7.7 4.7  --   HGB 10.1*   < > 14.2 15.6 13.4 13.4 13.4 13.0  HCT 34.2*   < > 45.0 46.0 42.6 42.4 42.1 39.1  MCV 100.9*   < > 94.7  --  94.5 95.3 92.7 90.5  PLT 146*   < > 263  --  211 181 161 174   < > = values in this interval not displayed.   Basic Metabolic Panel:  Recent Labs  Lab 05/25/19 1216  05/27/19 0042  05/28/19 0144 05/28/19 1620 05/29/19 0435 05/29/19 1552 05/30/19 0350 05/31/19 0241  NA 134*   < > 136   < > 135 133* 136 133* 134* 135  K 4.1   < > 4.4   < > 4.0 4.2 4.3 4.3 4.1 3.7  CL 92*   < > 97*   < > 101 102 100 100 102 101  CO2 27   < > 26   < > 25 20* 23 23 22 25   GLUCOSE 156*   < > 118*   < > 82 71 61* 106* 97 156*  BUN 9   < > 14   < > 8 10 11 7 7 13   CREATININE 3.82*   < > 3.57*   < > 2.21* 2.58* 2.13* 1.78* 1.50* 2.61*  CALCIUM 8.4*   < > 8.6*   < > 8.4* 8.4* 8.9 8.7* 8.7* 9.0  MG 2.4  --  2.3  --  2.5*  --   --   --  2.4  2.4  PHOS  --    < > 3.5   < > 2.1* 2.3* 2.0* 1.9* 1.2* 2.4*   < > = values in this interval not displayed.   GFR: Estimated Creatinine Clearance: 33.1 mL/min (A) (by C-G formula based on SCr of 2.61 mg/dL (H)). Liver Function Tests: Recent Labs  Lab 05/25/19 1216  05/28/19 0144 05/28/19 1620 05/29/19 0435 05/29/19 1552 05/30/19 0350  AST 23  --   --   --   --   --   --   ALT 15  --   --   --   --   --   --   ALKPHOS 103  --   --   --   --   --   --   BILITOT 1.4*  --   --   --   --   --   --   PROT 7.1  --   --   --   --   --   --   ALBUMIN 3.5   < > 2.8* 2.8* 3.0* 3.0* 2.9*   < > = values in this interval not displayed.   No results for input(s): LIPASE, AMYLASE in the last 168 hours. Recent Labs  Lab 05/25/19 1217  AMMONIA 41*   Coagulation Profile: Recent Labs  Lab 05/25/19 1216  INR 1.4*   Cardiac Enzymes: No results for input(s): CKTOTAL, CKMB, CKMBINDEX, TROPONINI in the last 168 hours. BNP (last 3 results) No results for input(s): PROBNP in the last 8760 hours. HbA1C: No results for input(s): HGBA1C in the last 72 hours. CBG: Recent Labs  Lab 05/30/19 1128 05/30/19 1551 05/30/19 2136 05/30/19 2353 05/31/19 0447  GLUCAP 149* 168* 203* 170* 152*   Lipid Profile: No results for input(s): CHOL, HDL, LDLCALC, TRIG, CHOLHDL, LDLDIRECT in the last 72 hours. Thyroid  Function Tests: No results for input(s): TSH, T4TOTAL, FREET4, T3FREE, THYROIDAB in the last 72 hours. Anemia Panel: No results for input(s): VITAMINB12, FOLATE, FERRITIN, TIBC, IRON, RETICCTPCT in the last 72 hours. Sepsis Labs: Recent Labs  Lab 05/25/19 1109 05/25/19 1217 05/27/19 0042 05/28/19 0144  PROCALCITON  --   --  0.28 0.27  LATICACIDVEN 2.7* 2.2* 1.9  --     Recent Results (from the past 240 hour(s))  Blood Culture (routine x 2)     Status: None   Collection Time: 05/25/19 11:24 AM   Specimen: BLOOD  Result Value Ref Range Status   Specimen Description BLOOD SITE NOT SPECIFIED  Final   Special Requests   Final    BOTTLES DRAWN AEROBIC AND ANAEROBIC Blood Culture adequate volume   Culture   Final    NO GROWTH 5 DAYS Performed at [facility], [address], [place], [place] [address]    Report Status 05/30/2019 FINAL  Final  SARS CORONAVIRUS 2 (TAT 6-24 HRS) Nasopharyngeal Nasopharyngeal Swab     Status: None   Collection Time: 05/25/19 11:47 AM   Specimen: Nasopharyngeal Swab  Result Value Ref Range Status   SARS Coronavirus 2 NEGATIVE NEGATIVE Final    Comment: (NOTE) SARS-CoV-2 target nucleic acids are NOT DETECTED. The SARS-CoV-2 RNA is generally detectable in upper and lower respiratory specimens during the acute phase of infection. Negative results do not preclude SARS-CoV-2 infection, do not rule out co-infections with other pathogens, and should not be used as the sole basis for treatment or other patient management decisions. Negative results must be combined with clinical observations, patient history,  and epidemiological information. The expected result is Negative. Fact Sheet for Patients: SugarRoll.be Fact Sheet for Healthcare Providers: https://www.woods-mathews.com/ This test is not yet approved or cleared by the [place] FDA and  has been authorized for detection and/or diagnosis of  SARS-CoV-2 by FDA under an Emergency Use Authorization (EUA). This EUA will remain  in effect (meaning this test can be used) for the duration of the COVID-19 declaration under Section 56 4(b)(1) of the Act, 21 U.S.C. section 360bbb-3(b)(1), unless the authorization is terminated or revoked sooner. Performed at [facility], [place] [address] [place], [place] [address]   Blood Culture (routine x 2)     Status: None   Collection Time: 05/25/19  2:18 PM   Specimen: BLOOD RIGHT HAND  Result Value Ref Range Status   Specimen Description BLOOD RIGHT HAND  Final   Special Requests   Final    BOTTLES DRAWN AEROBIC AND ANAEROBIC Blood Culture adequate volume   Culture   Final    NO GROWTH 5 DAYS Performed at [facility], [place] [address] [place], [place] [address]    Report Status 05/30/2019 FINAL  Final  Body fluid culture (includes gram stain)     Status: None   Collection Time: 05/25/19  6:46 PM   Specimen: Pleural Fluid  Result Value Ref Range Status   Specimen Description PLEURAL  Final   Special Requests NONE  Final   Gram Stain   Final    FEW WBC PRESENT,BOTH PMN AND MONONUCLEAR NO ORGANISMS SEEN    Culture   Final    NO GROWTH 3 DAYS Performed at [facility], [address], [place], [place] [address]    Report Status 05/29/2019 FINAL  Final  MRSA PCR Screening     Status: None   Collection Time: 05/26/19 12:06 AM   Specimen: Nasopharyngeal  Result Value Ref Range Status   MRSA by PCR NEGATIVE NEGATIVE Final    Comment:        The GeneXpert MRSA Assay (FDA approved for NASAL specimens only), is one component of a comprehensive MRSA colonization surveillance program. It is not intended to diagnose MRSA infection nor to guide or monitor treatment for MRSA infections. Performed at [facility], [place] [address], [place], [place] [address]          Radiology Studies: No results found.      Scheduled Meds: . aspirin  325 mg Oral  Daily  . brimonidine  1 drop Both Eyes BID  . [START ON 06/01/2019] calcitRIOL  0.5 mcg Oral 3 times weekly  . Chlorhexidine Gluconate Cloth  6 each Topical Daily  . Chlorhexidine Gluconate Cloth  6 each Topical Q0600  . Chlorhexidine Gluconate Cloth  6 each Topical Q0600  . dorzolamide-timolol  1 drop Both Eyes BID  . feeding supplement (PRO-STAT SUGAR FREE 64)  30 mL Oral BID  . heparin      . heparin  5,000 Units Subcutaneous Q8H  . latanoprost  1 drop Both Eyes QHS  . metoprolol tartrate  25 mg Oral BID  . polyethylene glycol  17 g Oral QHS   Continuous Infusions:        Aline August, MD [facility] 05/31/2019, 10:19 AM

## 2019-05-31 NOTE — Progress Notes (Signed)
Pt was transported to dialysis around 7:10 am.

## 2019-05-31 NOTE — Progress Notes (Addendum)
   NAME:  Shawn [name], MRN:  [id number], DOB:  11-04-1960, LOS: 6 ADMISSION DATE:  05/25/2019, CONSULTATION DATE:  05/25/19 REFERRING MD:  ER, CHIEF COMPLAINT:  AMS   Brief History   58 year old man with hx of HD dependent ESRD presenting with syncopal episode in HD.  History of present illness   58 year old man with hx of HD dependent ESRD presenting with syncopal episode in HD.  Noted to be bradycardic, hypoatnsive brought to ER externally paced.  Seen in ER, pacing turned off and Bps stabilized.  Noted to be hypoxemic and hypercarbic.  PCCM asked to admit.  Past Medical History  CVA, DM, HTN, ESRD, Blind  Significant Hospital Events   11/16 Admitted 11/17 CRRT started, BiPAP temporarily for pure resp acidosis.   Consults:  Cardiology  Procedures:  11/16 R thoracentesis  Significant Diagnostic Tests:  CXR bilateral effusions, R>L CT chest 11/18 >  Micro Data:  Pleural fluid 11/18 Blood cx 11/16 > COVID > negative  Antimicrobials:  Vanc 11/16  > 11/17 Zosyn 11/16 > 11/18  Interim history/subjective:  Denies any significant problems at present Cramping with dialysis today  Objective   Blood pressure 108/69, pulse 95, temperature 98.6 F (37 C), temperature source Oral, resp. rate 17, height 6' (1.829 m), weight 75.9 kg, SpO2 98 %.        Intake/Output Summary (Last 24 hours) at 05/31/2019 1731 Last data filed at 05/31/2019 1300 Gross per 24 hour  Intake 180 ml  Output -  Net 180 ml   Filed Weights   05/29/19 0500 05/30/19 0500 05/31/19 0710  Weight: 77.1 kg 73.5 kg 75.9 kg   Physical Exam: General: Chronically ill-appearing HENT: [place]/AT, PERRL, EOM-I and MMM Respiratory: Decreased air movement bilaterally Cardiovascular: RRR, Nl S1/S2 and -M/R/G GI: Soft, bowel sounds appreciated Extremities: -edema and -tenderness Neuro: Alert and interactive, moving all ext to command Skin: Intact, no rashes or bruising  Chest x-ray shows bilateral pleural  effusions  Resolved Hospital Problem list   NA  Assessment & Plan:   Acute hypercarbic respiratory failure -Bilateral pleural effusions -On hemodialysis, fluid removal -Titrate FiO2 to sats greater than 88% -As needed chest x-ray  Right chylothorax -Pleural cultures and cytology negative -With extensive adenopathy, though may be reactive -Concern for lymphoproliferative process with compromise of lymphatic drainage -Consider EBUS when more stable  End-stage renal disease on hemodialysis -Off pressors  Shocklike episode during hemodialysis -Resolved  Consult dietitian -Dietary adjustment may help chylothorax   Best practice:  Diet: Renal diet Pain/Anxiety/Delirium protocol (if indicated): None VAP protocol SQH GI prophylaxis: NA Glucose control: SSI Mobility: BR Code Status: Full Family Communication: Patient updated 11/20 Disposition: MedSurg  Sherrilyn Rist, MD [place], PCCM

## 2019-05-31 NOTE — Evaluation (Signed)
Physical Therapy Evaluation Patient Details Name: Shawn [name] MRN: [id number] DOB: Oct 24, 1960 Today's Date: 05/31/2019   History of Present Illness  Pt is a 58 year old man with hx of HD dependent ESRD presenting with syncopal episode in HD. Pt with BiL Pleural effusions and s/p thoracentesis 05/25/19. PMHx: CVA, DM, HTN, ESRD, blind. Pt on CRRT/HD.  Clinical Impression  Pt admitted with above. Prior to admission, pt lives with spouse and is independent with ADL's/mobility. Utilizes [employer] for transportation to/from dialysis. Pt is legally blind at baseline. On PT evaluation, pt presents with generalized weakness, decreased activity tolerance, and balance impairments. Ambulating 100 feet with bilateral handheld assist. Recommended use of walker for increased stability. Think pt would benefit from returning to familiar environment due to visual impairment; recommending HHPT to maximize functional independence.     Follow Up Recommendations Home health PT;Supervision/Assistance - 24 hour    Equipment Recommendations  Rolling walker with 5" wheels    Recommendations for Other Services       Precautions / Restrictions Precautions Precautions: Fall;Other (comment) Precaution Comments: blindness Restrictions Weight Bearing Restrictions: No      Mobility  Bed Mobility Overal bed mobility: Needs Assistance Bed Mobility: Rolling;Sidelying to Sit Rolling: Supervision Sidelying to sit: Mod assist       General bed mobility comments: ModA for trunk elevation with HOB elevated 30*  Transfers Overall transfer level: Needs assistance Equipment used: 2 person hand held assist Transfers: Sit to/from Stand Sit to Stand: Min assist;+2 physical assistance;+2 safety/equipment         General transfer comment: minA +2 for handheldA; without, pt very unsteady.  Ambulation/Gait Ambulation/Gait assistance: Min assist;+2 physical assistance;+2 safety/equipment Gait Distance (Feet):  100 Feet Assistive device: 2 person hand held assist Gait Pattern/deviations: Step-through pattern;Decreased stride length;Narrow base of support;Scissoring Gait velocity: decreased Gait velocity interpretation: <1.8 ft/sec, indicate of risk for recurrent falls General Gait Details: Pt with dynamic unsteadiness, requiring bilateral handheld assist and cues for environmental navigation. VC's for increased foot clearance.  Stairs            Wheelchair Mobility    Modified Rankin (Stroke Patients Only)       Balance Overall balance assessment: Needs assistance Sitting-balance support: Feet supported Sitting balance-Leahy Scale: Good     Standing balance support: Bilateral upper extremity supported Standing balance-Leahy Scale: Poor                               Pertinent Vitals/Pain Pain Assessment: No/denies pain    Home Living Family/patient expects to be discharged to:: Private residence Living Arrangements: Spouse/significant other Available Help at Discharge: Available 24 hours/day Type of Home: Apartment Home Access: Level entry     Home Layout: One level Home Equipment: Cane - single point;Shower seat - built in Additional Comments: has RW from BIL, but does not use    Prior Function Level of Independence: Independent         Comments: Active with [facility]; Independent in home with ADL and spouse performs IADL. Pt rides [employer] to dialysis. Spouse does not drive.     Hand Dominance   Dominant Hand: Right    Extremity/Trunk Assessment   Upper Extremity Assessment Upper Extremity Assessment: Generalized weakness    Lower Extremity Assessment Lower Extremity Assessment: Generalized weakness    Cervical / Trunk Assessment Cervical / Trunk Assessment: Normal  Communication   Communication: No difficulties  Cognition Arousal/Alertness: Awake/alert  Behavior During Therapy: WFL for tasks assessed/performed Overall  Cognitive Status: Within Functional Limits for tasks assessed                                        General Comments General comments (skin integrity, edema, etc.): VSS.    Exercises     Assessment/Plan    PT Assessment Patient needs continued PT services  PT Problem List Decreased strength;Decreased activity tolerance;Decreased balance;Decreased mobility       PT Treatment Interventions Gait training;DME instruction;Functional mobility training;Therapeutic activities;Balance training;Therapeutic exercise;Patient/family education    PT Goals (Current goals can be found in the Care Plan section)  Acute Rehab PT Goals Patient Stated Goal: to get out of bed PT Goal Formulation: With patient Time For Goal Achievement: 06/14/19 Potential to Achieve Goals: Good    Frequency Min 3X/week   Barriers to discharge        Co-evaluation PT/OT/SLP Co-Evaluation/Treatment: Yes Reason for Co-Treatment: Complexity of the patient's impairments (multi-system involvement);Necessary to address cognition/behavior during functional activity;For patient/therapist safety;To address functional/ADL transfers   OT goals addressed during session: ADL's and self-care       AM-PAC PT "6 Clicks" Mobility  Outcome Measure Help needed turning from your back to your side while in a flat bed without using bedrails?: None Help needed moving from lying on your back to sitting on the side of a flat bed without using bedrails?: A Little Help needed moving to and from a bed to a chair (including a wheelchair)?: A Little Help needed standing up from a chair using your arms (e.g., wheelchair or bedside chair)?: A Little Help needed to walk in hospital room?: A Little Help needed climbing 3-5 steps with a railing? : A Lot 6 Click Score: 18    End of Session Equipment Utilized During Treatment: Gait belt Activity Tolerance: Patient tolerated treatment well Patient left: in chair;with call  bell/phone within reach;with chair alarm set Nurse Communication: Mobility status PT Visit Diagnosis: Unsteadiness on feet (R26.81);Muscle weakness (generalized) (M62.81);Difficulty in walking, not elsewhere classified (R26.2)    Time: [id number] PT Time Calculation (min) (ACUTE ONLY): 20 min   Charges:   PT Evaluation $PT Eval Moderate Complexity: 1 Mod          Ellamae Sia, [place], DPT Acute Rehabilitation Services Pager [phone] Office [phone]   Willy Eddy 05/31/2019, 4:20 PM

## 2019-05-31 NOTE — Progress Notes (Addendum)
Shawn [name] Progress Note   Subjective: Seen during HD - 2.5L net UF goal and tolerating so far. BP soft. Denies CP/dyspnea and on room air. Has been transferred out of ICU.    Objective Vitals:   05/31/19 0750 05/31/19 0820 05/31/19 0850 05/31/19 0920  BP: 124/75 110/66 103/64 107/63  Pulse: 88 89 90 89  Resp: 16 14 16 18   Temp:      TempSrc:      SpO2:      Weight:      Height:       Physical Exam General: Alert/orient, well appearing and NAD. Blind. Heart: RRR; 2/6 murmur Lungs: CTA anteriorly and laterally Abdomen: soft, non-tender Extremities: No LE edema Dialysis Access: TDC in R chest  Additional Objective Labs: Basic Metabolic Panel: Recent Labs  Lab 05/29/19 1552 05/30/19 0350 05/31/19 0241  NA 133* 134* 135  K 4.3 4.1 3.7  CL 100 102 101  CO2 23 22 25   GLUCOSE 106* 97 156*  BUN 7 7 13   CREATININE 1.78* 1.50* 2.61*  CALCIUM 8.7* 8.7* 9.0  PHOS 1.9* 1.2* 2.4*   Liver Function Tests: Recent Labs  Lab 05/25/19 1216  05/29/19 0435 05/29/19 1552 05/30/19 0350  AST 23  --   --   --   --   ALT 15  --   --   --   --   ALKPHOS 103  --   --   --   --   BILITOT 1.4*  --   --   --   --   PROT 7.1  --   --   --   --   ALBUMIN 3.5   < > 3.0* 3.0* 2.9*   < > = values in this interval not displayed.   CBC: Recent Labs  Lab 05/27/19 0042  05/28/19 0144 05/29/19 0435 05/30/19 0350 05/31/19 0241  WBC 11.7*  --  11.5* 9.6 6.0 6.7  NEUTROABS 10.0*  --  9.3* 7.7 4.7  --   HGB 14.2   < > 13.4 13.4 13.4 13.0  HCT 45.0   < > 42.6 42.4 42.1 39.1  MCV 94.7  --  94.5 95.3 92.7 90.5  PLT 263  --  211 181 161 174   < > = values in this interval not displayed.   Blood Culture    Component Value Date/Time   SDES PLEURAL 05/25/2019 1846   SPECREQUEST NONE 05/25/2019 1846   CULT  05/25/2019 1846    NO GROWTH 3 DAYS Performed at [facility], [place] [address] [place], [place] [address]    REPTSTATUS 05/29/2019 FINAL 05/25/2019 1846    Medications:  . aspirin  325 mg Oral Daily  . brimonidine  1 drop Both Eyes BID  . Chlorhexidine Gluconate Cloth  6 each Topical Daily  . Chlorhexidine Gluconate Cloth  6 each Topical Q0600  . Chlorhexidine Gluconate Cloth  6 each Topical Q0600  . dorzolamide-timolol  1 drop Both Eyes BID  . heparin      . heparin  5,000 Units Subcutaneous Q8H  . latanoprost  1 drop Both Eyes QHS  . metoprolol tartrate  25 mg Oral BID  . polyethylene glycol  17 g Oral QHS    Dialysis Orders: Center:[facility] MWF. 4:15hr, [id number], 3K/2Ca, TDC, heparin 6000 bolus - Calcitriol 0.56mcg PO q HD - Venofer 50 mg q week - No ESA, Last OP HGB 13.9 05/20/19  Assessment: Pt is a 58 y.o. yo male  with ESRD who was admitted on 05/25/2019 with hypotension and hypoxia related to transudative pleural effusions   Plan: 1. Pulm: B effusions on admit - s/p R thora 11/16 with 1.5L milky brown fluid removed - Cx negative. Prev on Zosyn, now off. COVID neg. Off oxygen at this time and transferred to floor. Looks much improved.  2. ESRD: Usual MWF sched. Prior hypotensive episode after HD - required CRRT 11/17-11/20 d/t volume overload/hypotension. Now back on regular HD. Will be following holiday schedule this week - HD on Sun/Tues/Fri - HD today. 3. Anemia: Hgb remains > 12. No ESA needed. 4. Secondary hyperparathyroidism: Ca ok, Phos low but improving (s/p replacement 11/21). Resumed VDRA. 5. HTN/volume: Was on multiple antihypertensives as an OP- all been stopped.  Req transient pressor support and midodrine, now off  - BP better although still low. He is well below prior EDW by bed weight - will need to be changed on d/c. CXR 11/20 still with B effusions - UF as tolerated. 6. Nutrition: Alb low - start pro-stat 7. Bradycardia on admit requiring transcutaneous pacing - resolved now.  Veneta Penton, PA-C 05/31/2019, 10:02 AM  [name] Pager: [phone]  Patient seen and examined,  agree with above note with above modifications. Feels better, seen on HD- I think his new EDW will be almost 1- kg under his old weight.  Encouraged him to move around in room-  Possibly discharge soon Corliss Parish, MD 05/31/2019

## 2019-05-31 NOTE — Progress Notes (Signed)
PT Cancellation Note  Patient Details Name: Shawn [name] MRN: [id number] DOB: 10/17/1960   Cancelled Treatment:    Reason Eval/Treat Not Completed: Patient at procedure or test/unavailable (HD).  Ellamae Sia, PT, DPT Acute Rehabilitation Services Pager [phone] Office [phone]    Willy Eddy 05/31/2019, 8:10 AM

## 2019-05-31 NOTE — Progress Notes (Signed)
OT Cancellation Note  Patient Details Name: Zac Schlicher MRN: [id number] DOB: 01-20-1961   Cancelled Treatment:    Reason Eval/Treat Not Completed: Patient at procedure or test/ unavailable(Pt at HD. OT to follow as schedule allows.)  Darryl Nestle) Marsa Aris OTR/L Acute Rehabilitation Services Pager: [phone] Office: [place] 05/31/2019, 10:57 AM

## 2019-05-31 NOTE — Evaluation (Signed)
Occupational Therapy Evaluation Patient Details Name: Shawn [name] MRN: [id number] DOB: [birth year] Today's Date: 05/31/2019    History of Present Illness Pt is a 58 year old man with hx of HD dependent ESRD presenting with syncopal episode in HD. Pt with BiL Pleural effusions and s/p thoracentesis 05/25/19. PMHx: CVA, DM, HTN, ESRD, blind. Pt on CRRT/HD.   Clinical Impression   Pt PTA: living with spouse, reports independence in home; spouse does not drive- rides bus to dialysis. Pt currently, performing minA to modA +2 for handheldA; without, pt very unsteady. Pt minA overall for ADL tasks. Pt guided through sink level ADL with minA. Pt limited by decreased strength, decreased mobility, decreased activity tolerance and increased need for caregiver assist for ADL. Pt would greatly benefit from continued OT skilled services for ADL, mobility and safety in [facility] setting. OT following.     Follow Up Recommendations  Home health OT;Supervision/Assistance - 24 hour    Equipment Recommendations  3 in 1 bedside commode    Recommendations for Other Services       Precautions / Restrictions Precautions Precautions: Fall;Other (comment) Precaution Comments: blindness Restrictions Weight Bearing Restrictions: No      Mobility Bed Mobility Overal bed mobility: Needs Assistance Bed Mobility: Rolling;Sidelying to Sit Rolling: Supervision Sidelying to sit: Mod assist       General bed mobility comments: ModA for trunk elevation with HOB elevated 30*  Transfers Overall transfer level: Needs assistance Equipment used: 2 person hand held assist Transfers: Sit to/from Stand Sit to Stand: Min assist;Mod assist;+2 physical assistance;+2 safety/equipment         General transfer comment: minA to modA +2 for handheldA; without, pt very unsteady.    Balance Overall balance assessment: No apparent balance deficits (not formally assessed)                                         ADL either performed or assessed with clinical judgement   ADL Overall ADL's : Needs assistance/impaired Eating/Feeding: Set up;Sitting   Grooming: Minimal assistance;Cueing for safety;Standing Grooming Details (indicate cue type and reason): Pt's hands guided to various items to sequence through the task. Upper Body Bathing: Minimal assistance;Standing   Lower Body Bathing: Minimal assistance;Sitting/lateral leans;Sit to/from stand;Cueing for safety   Upper Body Dressing : Minimal assistance;Sitting   Lower Body Dressing: Minimal assistance;Sitting/lateral leans;Sit to/from stand   Toilet Transfer: Minimal assistance;Cueing for safety;Ambulation   Toileting- Clothing Manipulation and Hygiene: Minimal assistance;Sitting/lateral lean;Sit to/from stand;Cueing for safety;+2 for physical assistance;+2 for safety/equipment       Functional mobility during ADLs: Minimal assistance;Cueing for safety;+2 for physical assistance;+2 for safety/equipment       Vision Baseline Vision/History: Legally blind Patient Visual Report: No change from baseline Vision Assessment?: Vision impaired- to be further tested in functional context     Perception     Praxis      Pertinent Vitals/Pain Pain Assessment: No/denies pain     Hand Dominance Right   Extremity/Trunk Assessment Upper Extremity Assessment Upper Extremity Assessment: Generalized weakness   Lower Extremity Assessment Lower Extremity Assessment: Generalized weakness   Cervical / Trunk Assessment Cervical / Trunk Assessment: Normal   Communication Communication Communication: No difficulties   Cognition Arousal/Alertness: Awake/alert Behavior During Therapy: WFL for tasks assessed/performed Overall Cognitive Status: Within Functional Limits for tasks assessed  General Comments  VSS.    Exercises     Shoulder Instructions      Home Living  Family/patient expects to be discharged to:: Private residence Living Arrangements: Spouse/significant other Available Help at Discharge: Available 24 hours/day Type of Home: Apartment Home Access: Level entry     Home Layout: One level     Bathroom Shower/Tub: Walk-in shower         Home Equipment: Kasandra Knudsen - single point;Shower seat - built in   Additional Comments: has RW from BIL, but does not use      Prior Functioning/Environment Level of Independence: Independent        Comments: Active with [facility]; Independent in home with ADL and spouse performs IADL. Pt rides [employer] to dialysis. Spouse does not drive.        OT Problem List: Decreased strength;Decreased activity tolerance;Impaired balance (sitting and/or standing);Decreased knowledge of use of DME or AE      OT Treatment/Interventions: Self-care/ADL training;Therapeutic exercise;Neuromuscular education;Energy conservation;DME and/or AE instruction;Therapeutic activities;Patient/family education;Balance training    OT Goals(Current goals can be found in the care plan section) Acute Rehab OT Goals Patient Stated Goal: to get out of bed OT Goal Formulation: With patient Time For Goal Achievement: 06/14/19 Potential to Achieve Goals: Good ADL Goals Pt Will Perform Grooming: with supervision;standing Pt Will Perform Lower Body Dressing: with supervision;sitting/lateral leans;sit to/from stand Pt Will Transfer to Toilet: with supervision;ambulating Pt Will Perform Toileting - Clothing Manipulation and hygiene: with min guard assist;sit to/from stand Pt/caregiver will Perform Home Exercise Program: Increased strength;With Supervision  OT Frequency: Min 2X/week   Barriers to D/C:            Co-evaluation PT/OT/SLP Co-Evaluation/Treatment: Yes Reason for Co-Treatment: Complexity of the patient's impairments (multi-system involvement);For patient/therapist safety   OT goals addressed during session:  ADL's and self-care      AM-PAC OT "6 Clicks" Daily Activity     Outcome Measure Help from another person eating meals?: A Little Help from another person taking care of personal grooming?: A Little Help from another person toileting, which includes using toliet, bedpan, or urinal?: A Lot Help from another person bathing (including washing, rinsing, drying)?: A Lot Help from another person to put on and taking off regular upper body clothing?: A Little Help from another person to put on and taking off regular lower body clothing?: A Lot 6 Click Score: 15   End of Session Equipment Utilized During Treatment: Gait belt Nurse Communication: Mobility status  Activity Tolerance: Patient tolerated treatment well;Treatment limited secondary to medical complications (Comment) Patient left: in chair;with call bell/phone within reach;with chair alarm set  OT Visit Diagnosis: Unsteadiness on feet (R26.81);Muscle weakness (generalized) (M62.81)                Time: [id number] OT Time Calculation (min): 19 min Charges:  OT General Charges $OT Visit: 1 Visit OT Evaluation $OT Eval Moderate Complexity: 1 Mod  Darryl Nestle) Marsa Aris OTR/L Acute Rehabilitation Services Pager: [phone] Office: [phone]   Audie Pinto 05/31/2019, 3:15 PM

## 2019-06-01 DIAGNOSIS — E118 Type 2 diabetes mellitus with unspecified complications: Secondary | ICD-10-CM

## 2019-06-01 DIAGNOSIS — Z992 Dependence on renal dialysis: Secondary | ICD-10-CM

## 2019-06-01 DIAGNOSIS — Z9889 Other specified postprocedural states: Secondary | ICD-10-CM

## 2019-06-01 LAB — CBC WITH DIFFERENTIAL/PLATELET
Abs Immature Granulocytes: 0.01 10*3/uL (ref 0.00–0.07)
Basophils Absolute: 0 10*3/uL (ref 0.0–0.1)
Basophils Relative: 1 %
Eosinophils Absolute: 0.2 10*3/uL (ref 0.0–0.5)
Eosinophils Relative: 4 %
HCT: 38 % — ABNORMAL LOW (ref 39.0–52.0)
Hemoglobin: 12.7 g/dL — ABNORMAL LOW (ref 13.0–17.0)
Immature Granulocytes: 0 %
Lymphocytes Relative: 12 %
Lymphs Abs: 0.5 10*3/uL — ABNORMAL LOW (ref 0.7–4.0)
MCH: 30.4 pg (ref 26.0–34.0)
MCHC: 33.4 g/dL (ref 30.0–36.0)
MCV: 90.9 fL (ref 80.0–100.0)
Monocytes Absolute: 0.8 10*3/uL (ref 0.1–1.0)
Monocytes Relative: 18 %
Neutro Abs: 2.9 10*3/uL (ref 1.7–7.7)
Neutrophils Relative %: 65 %
Platelets: 161 10*3/uL (ref 150–400)
RBC: 4.18 MIL/uL — ABNORMAL LOW (ref 4.22–5.81)
RDW: 17.2 % — ABNORMAL HIGH (ref 11.5–15.5)
WBC: 4.4 10*3/uL (ref 4.0–10.5)
nRBC: 0 % (ref 0.0–0.2)

## 2019-06-01 LAB — BASIC METABOLIC PANEL
Anion gap: 8 (ref 5–15)
BUN: 15 mg/dL (ref 6–20)
CO2: 26 mmol/L (ref 22–32)
Calcium: 8.7 mg/dL — ABNORMAL LOW (ref 8.9–10.3)
Chloride: 101 mmol/L (ref 98–111)
Creatinine, Ser: 2.48 mg/dL — ABNORMAL HIGH (ref 0.61–1.24)
GFR calc Af Amer: 32 mL/min — ABNORMAL LOW (ref >60)
GFR calc non Af Amer: 28 mL/min — ABNORMAL LOW (ref >60)
Glucose, Bld: 166 mg/dL — ABNORMAL HIGH (ref 70–99)
Potassium: 3.5 mmol/L (ref 3.5–5.1)
Sodium: 135 mmol/L (ref 135–145)

## 2019-06-01 LAB — GLUCOSE, CAPILLARY
Glucose-Capillary: 127 mg/dL — ABNORMAL HIGH (ref 70–99)
Glucose-Capillary: 140 mg/dL — ABNORMAL HIGH (ref 70–99)
Glucose-Capillary: 170 mg/dL — ABNORMAL HIGH (ref 70–99)
Glucose-Capillary: 172 mg/dL — ABNORMAL HIGH (ref 70–99)

## 2019-06-01 MED ORDER — CALCITRIOL 0.5 MCG PO CAPS: 0.5000 ug | ORAL_CAPSULE | ORAL | 0 refills | Status: AC

## 2019-06-01 NOTE — Discharge Summary (Signed)
Physician Discharge Summary  Shawn [name] [id number] DOB: [birth year] DOA: 05/25/2019  PCP: Vincente Liberty, MD  Admit date: 05/25/2019 Discharge date: 06/01/2019  Admitted From: Home Disposition: Home  Recommendations for Outpatient Follow-up:  1. Follow up with PCP in 1 week 2. Outpatient follow-up with pulmonary and cardiology 3. Outpatient follow-up with hemodialysis as scheduled 4. Follow up in ED if symptoms worsen or new appear   Home Health: No Equipment/Devices: None  Discharge Condition: Stable CODE STATUS: Full Diet recommendation: Heart healthy/carb modified/renal hemodialysis diet   Brief/Interim Summary: 58 year old male with history of end-stage renal disease on hemodialysis, diabetes mellitus type 2, hypertension, unspecified CVA, blindness in the right eye presented with syncopal episode in hemodialysis.  He was found to be bradycardic, hypotensive and was brought to the ER externally paced.  In the ED, pacing turned off and blood pressure stabilized.  Cardiology was consulted who subsequently signed off.  He was noted to be hypoxemic and hypercarbic secondary to fluid overload and was admitted to ICU under PCCM service.  Nephrology was consulted.  Required pressors initially.  Was initially started on CRRT and required BiPAP initially.  Underwent right-sided thoracentesis on 05/25/2019.  Covid testing was negative.  Initially started on broad-spectrum antibiotics; Zosyn was subsequently discontinued on 05/27/2019.  Pressors were subsequently discontinued.  He was transferred to [facility] service on 05/31/2019.  Subsequently, he tolerated hemodialysis.  PT/OT recommended home health PT/OT.  Discharge home today; nephrology has cleared the patient for discharge.  Discharge Diagnoses:   Acute hypercarbic respiratory failure Bilateral pleural effusions -Most likely in the setting of extensive volume overload -Initially required BiPAP. -Status post right-sided  thoracentesis on 05/27/2019; initially required CRRT for volume removal.  Initially treated with broad-spectrum antibiotics but subsequently discontinued on 05/27/2019. -Currently respiratory status stable and on room air.  Transferred to [facility] service on 05/31/2019.  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  CT chest with lymphadenopathy, however may be reactive in the setting of volume overload.  Fluid cultures and cytology negative.  Might need repeat CT as an outpatient and outpatient follow-up with pulmonary.  Syncope Shock: unspecified Brief episode of bradycardia -Patient presented with syncope and was found to be bradycardic requiring external pacing.  Subsequently in the ED, pacer was turned off.  Cardiology was consulted and they have signed off.  Patient initially required pressors which have been subsequently discontinued.  Heart rate stable now.  Continue metoprolol.  Outpatient follow-up with cardiology. -Cultures negative so far  Hypertension -On multiple antihypertensives as an outpatient.  Required transient pressor support and midodrine, now off. -Currently on metoprolol.  Blood pressure stable.  We will hold other antihypertensives upon discharge.  Outpatient follow-up with PCP regarding the same.  Generalized deconditioning -PT/OT recommend home health PT/OT.  Diabetes mellitus type 2  -Blood sugars on the lower side.  Hold Levemir on discharge for now.  Outpatient follow-up with PCP regarding resumption of Levemir.  Carb modified diet   Discharge Instructions  Discharge Instructions    Ambulatory referral to Cardiology   Complete by: As directed    Ambulatory referral to Pulmonology   Complete by: As directed    Diet - low sodium heart healthy   Complete by: As directed    Diet Carb Modified   Complete by: As directed    Increase activity slowly   Complete by: As directed      Allergies as of 06/01/2019   No Known Allergies     Medication List    STOP  taking  these medications   amLODipine 10 MG tablet Commonly known as: NORVASC   insulin detemir 100 UNIT/ML injection Commonly known as: LEVEMIR   lisinopril 10 MG tablet Commonly known as: ZESTRIL     TAKE these medications   aspirin EC 325 MG tablet Take 325 mg by mouth at bedtime.   atorvastatin 40 MG tablet Commonly known as: LIPITOR Take 1 tablet (40 mg total) by mouth daily.   b complex vitamins tablet Take 1 tablet by mouth at bedtime.   brimonidine 0.2 % ophthalmic solution Commonly known as: ALPHAGAN Place 1 drop into both eyes 2 (two) times daily.   calcitRIOL 0.5 MCG capsule Commonly known as: ROCALTROL Take 1 capsule (0.5 mcg total) by mouth 3 (three) times a week. Start taking on: June 03, 2019   diphenhydramine-acetaminophen 25-500 MG Tabs tablet Commonly known as: TYLENOL PM Take 1 tablet by mouth at bedtime as needed (pain).   dorzolamide-timolol 22.3-6.8 MG/ML ophthalmic solution Commonly known as: COSOPT Place 1 drop into both eyes 2 (two) times daily.   erythromycin ophthalmic ointment Place 1 application into the left eye at bedtime as needed (dry eyes/itching).   lanthanum 1000 MG chewable tablet Commonly known as: FOSRENOL Chew 1,000 mg by mouth 3 (three) times daily with meals.   latanoprost 0.005 % ophthalmic solution Commonly known as: XALATAN Place 1 drop into both eyes at bedtime.   metoprolol tartrate 25 MG tablet Commonly known as: LOPRESSOR Take 25 mg by mouth 2 (two) times daily.      Follow-up Information    Vincente Liberty, MD. Schedule an appointment as soon as possible for a visit in 1 week(s).   Specialty: Pulmonary Disease Contact information: [place] [address] [phone]        Buford Dresser, MD .   Specialty: Cardiology Contact information: [address] [place] [address] [phone]          No Known  Allergies  Consultations:  PCCM/nephrology   Procedures/Studies: Dg Chest 1 View  Result Date: 05/29/2019 CLINICAL DATA:  Shortness of breath EXAM: CHEST  1 VIEW COMPARISON:  05/26/2019 FINDINGS: Small bilateral pleural effusions, left greater than right. Bilateral diffuse interstitial thickening. No pneumothorax. Stable cardiomediastinal silhouette. Large bore right-sided central venous catheter with the tip projecting over the right atrium. No acute osseous abnormality. IMPRESSION: Bilateral pleural effusions, left greater than right with bilateral interstitial thickening. Electronically Signed   By: Kathreen Devoid   On: 05/29/2019 09:07   Ct Head Wo Contrast  Result Date: 05/25/2019 CLINICAL DATA:  Altered level of consciousness. EXAM: CT HEAD WITHOUT CONTRAST TECHNIQUE: Contiguous axial images were obtained from the base of the skull through the vertex without intravenous contrast. COMPARISON:  October 26, 2010. FINDINGS: Brain: Mild chronic ischemic white matter disease is noted. No mass effect or midline shift is noted. Ventricular size is within normal limits. There is no evidence of mass lesion, hemorrhage or acute infarction. Vascular: No hyperdense vessel or unexpected calcification. Skull: Normal. Negative for fracture or focal lesion. Sinuses/Orbits: No acute finding. Other: None. IMPRESSION: Mild chronic ischemic white matter disease. No acute intracranial abnormality seen. Electronically Signed   By: Marijo Conception M.D.   On: 05/25/2019 12:38   Ct Chest Wo Contrast  Result Date: 05/28/2019 CLINICAL DATA:  Pleural effusion, evaluate for chylothorax EXAM: CT CHEST WITHOUT CONTRAST TECHNIQUE: Multidetector CT imaging of the chest was performed following the standard protocol without IV contrast. COMPARISON:  CT 01/26/2010 FINDINGS:  Cardiovascular: Right IJ approach central venous catheter tip terminates in the right atrium. Heart size at the upper limits of normal. Few coronary artery  calcifications are present. Calcifications present upon the aortic leaflets. Aortic caliber is normal. Calcifications are present in the aortic arch. Normal 3 vessel branching pattern of the arch. Central pulmonary artery caliber is upper limits normal size. Luminal evaluation precluded in the absence of contrast. There is prominence of the azygous veins. Mediastinum/Nodes: Multiple enlarged mediastinal nodes are present including a 15 mm lower paratracheal/azygos lymph node (3/68) and a conglomerate subcarinal nodal mass measuring up to 2.1 cm (3/89). No acute abnormality of the trachea or esophagus. Mild heterogeneity of thyroid gland. Lungs/Pleura: There is large volume bilateral pleural effusions measuring relatively simple attenuation (0-5 HU). There is associated atelectatic collapse of the right middle and lower lobes and left lower lobe and partial atelectatic collapse of both upper lobes as well. Underlying consolidation or mass lesion is not excluded. Upper Abdomen: No acute abnormalities present in the visualized portions of the upper abdomen. Musculoskeletal: No acute osseous abnormality or suspicious osseous lesion. Multilevel degenerative changes are present in the imaged portions of the spine. Degenerative changes in both shoulders. Diffuse body wall edema. Moderate right gynecomastia. IMPRESSION: 1. Large volume bilateral pleural effusions with associated atelectatic collapse of the right middle and lower lobes and left lower lobe and partial atelectatic collapse of both upper lobes. Underlying consolidation or mass lesion is not excluded. The composition of these simple density effusions could be determined by fluid sampling. 2. Multiple enlarged mediastinal nodes, nonspecific. 3. Moderate circumferential body wall edema, correlate for features of anasarca. 4. Gynecomastia. 5. Aortic Atherosclerosis (ICD10-I70.0). Electronically Signed   By: Lovena Le M.D.   On: 05/28/2019 15:34   Dg Chest Port  1 View  Result Date: 05/26/2019 CLINICAL DATA:  Pleural effusions EXAM: PORTABLE CHEST 1 VIEW COMPARISON:  May 25, 2019. FINDINGS: Central catheter tip is in the right atrium near the junction with the inferior vena cava. No pneumothorax. There are pleural effusions bilaterally. There is atelectatic change in both lower lobes. There is no frank consolidation. There is mild cardiomegaly with pulmonary vascularity within normal limits. No adenopathy. No bone lesions. IMPRESSION: Central catheter tip at junction of right atrium and inferior vena cava. No pneumothorax. Stable pleural effusions bilaterally with bibasilar atelectatic change. No frank consolidation. Stable cardiac prominence. Electronically Signed   By: Lowella Grip III M.D.   On: 05/26/2019 07:10   Dg Chest Port 1 View  Result Date: 05/25/2019 CLINICAL DATA:  Post thoracentesis EXAM: PORTABLE CHEST 1 VIEW COMPARISON:  05/25/2019, 12/21/2017 FINDINGS: Right-sided jugular venous catheter tip over the right atrial IVC junction. Decreased right pleural effusion with small residual. No pneumothorax. Moderate left pleural effusion remains. Left greater than right basilar airspace disease. Stable cardiomediastinal silhouette IMPRESSION: 1. Decreased right pleural effusion with small residual. No pneumothorax 2. Moderate left pleural effusion with left greater than right basilar airspace disease Electronically Signed   By: Donavan Foil M.D.   On: 05/25/2019 16:36   Dg Chest Port 1 View  Result Date: 05/25/2019 CLINICAL DATA:  Short of breath.  Dialysis patient EXAM: PORTABLE CHEST 1 VIEW COMPARISON:  None. FINDINGS: Large-bore central venous line with tip in the RIGHT atrium. Cardiac silhouette grossly normal. Resuscitation pads overlie the mediastinum. There is a large RIGHT pleural effusion which is new from prior. Central venous congestion noted. potential RIGHT lower lobe consolidation. IMPRESSION: 1. New large RIGHT pleural effusion.  2. Central venous congestion. 3. Potential RIGHT lower lobe consolidation. 4. No pneumothorax. Electronically Signed   By: Suzy Bouchard M.D.   On: 05/25/2019 13:03       Subjective: Patient seen and examined at bedside.  He denies any worsening shortness of breath, chest pain, fever or vomiting.  Thinks that he is ready to be home today.  Discharge Exam: Vitals:   06/01/19 0424 06/01/19 0838  BP: 122/66 113/69  Pulse: 75 80  Resp: 16 16  Temp: 98.6 F (37 C) 97.9 F (36.6 C)  SpO2: 98% 95%    General: Pt is alert, awake, not in acute distress.  Looks older than stated age. Cardiovascular: rate controlled, S1/S2 + Respiratory: bilateral decreased breath sounds at bases with some basilar crackles. Abdominal: Soft, NT, ND, bowel sounds + Extremities: Trace lower extremity edema, no cyanosis    The results of significant diagnostics from this hospitalization (including imaging, microbiology, ancillary and laboratory) are listed below for reference.     Microbiology: Recent Results (from the past 240 hour(s))  Blood Culture (routine x 2)     Status: None   Collection Time: 05/25/19 11:24 AM   Specimen: BLOOD  Result Value Ref Range Status   Specimen Description BLOOD SITE NOT SPECIFIED  Final   Special Requests   Final    BOTTLES DRAWN AEROBIC AND ANAEROBIC Blood Culture adequate volume   Culture   Final    NO GROWTH 5 DAYS Performed at [facility], [address], [place], [place] [address]    Report Status 05/30/2019 FINAL  Final  SARS CORONAVIRUS 2 (TAT 6-24 HRS) Nasopharyngeal Nasopharyngeal Swab     Status: None   Collection Time: 05/25/19 11:47 AM   Specimen: Nasopharyngeal Swab  Result Value Ref Range Status   SARS Coronavirus 2 NEGATIVE NEGATIVE Final    Comment: (NOTE) SARS-CoV-2 target nucleic acids are NOT DETECTED. The SARS-CoV-2 RNA is generally detectable in upper and lower respiratory specimens during the acute phase of infection.  Negative results do not preclude SARS-CoV-2 infection, do not rule out co-infections with other pathogens, and should not be used as the sole basis for treatment or other patient management decisions. Negative results must be combined with clinical observations, patient history, and epidemiological information. The expected result is Negative. Fact Sheet for Patients: SugarRoll.be Fact Sheet for Healthcare Providers: https://www.woods-mathews.com/ This test is not yet approved or cleared by the [place] FDA and  has been authorized for detection and/or diagnosis of SARS-CoV-2 by FDA under an Emergency Use Authorization (EUA). This EUA will remain  in effect (meaning this test can be used) for the duration of the COVID-19 declaration under Section 56 4(b)(1) of the Act, 21 U.S.C. section 360bbb-3(b)(1), unless the authorization is terminated or revoked sooner. Performed at [facility], [place] [address] [place], [place] [address]   Blood Culture (routine x 2)     Status: None   Collection Time: 05/25/19  2:18 PM   Specimen: BLOOD RIGHT HAND  Result Value Ref Range Status   Specimen Description BLOOD RIGHT HAND  Final   Special Requests   Final    BOTTLES DRAWN AEROBIC AND ANAEROBIC Blood Culture adequate volume   Culture   Final    NO GROWTH 5 DAYS Performed at [facility], [place] [address], [place], [place] [address]    Report Status 05/30/2019 FINAL  Final  Body fluid culture (includes gram stain)     Status: None  Collection Time: 05/25/19  6:46 PM   Specimen: Pleural Fluid  Result Value Ref Range Status   Specimen Description PLEURAL  Final   Special Requests NONE  Final   Gram Stain   Final    FEW WBC PRESENT,BOTH PMN AND MONONUCLEAR NO ORGANISMS SEEN    Culture   Final    NO GROWTH 3 DAYS Performed at [facility], [address], [place], [place] [address]    Report Status 05/29/2019 FINAL  Final   MRSA PCR Screening     Status: None   Collection Time: 05/26/19 12:06 AM   Specimen: Nasopharyngeal  Result Value Ref Range Status   MRSA by PCR NEGATIVE NEGATIVE Final    Comment:        The GeneXpert MRSA Assay (FDA approved for NASAL specimens only), is one component of a comprehensive MRSA colonization surveillance program. It is not intended to diagnose MRSA infection nor to guide or monitor treatment for MRSA infections. Performed at [facility], [place] [address] [place],  [address]      Labs: BNP (last 3 results) No results for input(s): BNP in the last 8760 hours. Basic Metabolic Panel: Recent Labs  Lab 05/25/19 1216  05/27/19 0042  05/28/19 0144 05/28/19 1620 05/29/19 0435 05/29/19 1552 05/30/19 0350 05/31/19 0241 06/01/19 0302  NA 134*   < > 136   < > 135 133* 136 133* 134* 135 135  K 4.1   < > 4.4   < > 4.0 4.2 4.3 4.3 4.1 3.7 3.5  CL 92*   < > 97*   < > 101 102 100 100 102 101 101  CO2 27   < > 26   < > 25 20* 23 23 22 25 26   GLUCOSE 156*   < > 118*   < > 82 71 61* 106* 97 156* 166*  BUN 9   < > 14   < > 8 10 11 7 7 13 15   CREATININE 3.82*   < > 3.57*   < > 2.21* 2.58* 2.13* 1.78* 1.50* 2.61* 2.48*  CALCIUM 8.4*   < > 8.6*   < > 8.4* 8.4* 8.9 8.7* 8.7* 9.0 8.7*  MG 2.4  --  2.3  --  2.5*  --   --   --  2.4 2.4  --   PHOS  --    < > 3.5   < > 2.1* 2.3* 2.0* 1.9* 1.2* 2.4*  --    < > = values in this interval not displayed.   Liver Function Tests: Recent Labs  Lab 05/25/19 1216  05/28/19 0144 05/28/19 1620 05/29/19 0435 05/29/19 1552 05/30/19 0350  AST 23  --   --   --   --   --   --   ALT 15  --   --   --   --   --   --   ALKPHOS 103  --   --   --   --   --   --   BILITOT 1.4*  --   --   --   --   --   --   PROT 7.1  --   --   --   --   --   --   ALBUMIN 3.5   < > 2.8* 2.8* 3.0* 3.0* 2.9*   < > = values in this interval not displayed.   No results for input(s): LIPASE, AMYLASE in  the last 168 hours. Recent Labs  Lab  05/25/19 1217  AMMONIA 41*   CBC: Recent Labs  Lab 05/27/19 0042  05/28/19 0144 05/29/19 0435 05/30/19 0350 05/31/19 0241 06/01/19 0302  WBC 11.7*  --  11.5* 9.6 6.0 6.7 4.4  NEUTROABS 10.0*  --  9.3* 7.7 4.7  --  2.9  HGB 14.2   < > 13.4 13.4 13.4 13.0 12.7*  HCT 45.0   < > 42.6 42.4 42.1 39.1 38.0*  MCV 94.7  --  94.5 95.3 92.7 90.5 90.9  PLT 263  --  211 181 161 174 161   < > = values in this interval not displayed.   Cardiac Enzymes: No results for input(s): CKTOTAL, CKMB, CKMBINDEX, TROPONINI in the last 168 hours. BNP: Invalid input(s): POCBNP CBG: Recent Labs  Lab 05/31/19 1610 05/31/19 2052 06/01/19 0027 06/01/19 0425 06/01/19 0814  GLUCAP 125* 141* 172* 140* 127*   D-Dimer No results for input(s): DDIMER in the last 72 hours. Hgb A1c No results for input(s): HGBA1C in the last 72 hours. Lipid Profile No results for input(s): CHOL, HDL, LDLCALC, TRIG, CHOLHDL, LDLDIRECT in the last 72 hours. Thyroid function studies No results for input(s): TSH, T4TOTAL, T3FREE, THYROIDAB in the last 72 hours.  Invalid input(s): FREET3 Anemia work up No results for input(s): VITAMINB12, FOLATE, FERRITIN, TIBC, IRON, RETICCTPCT in the last 72 hours. Urinalysis    Component Value Date/Time   COLORURINE YELLOW 01/25/2010 1953   APPEARANCEUR CLOUDY (A) 01/25/2010 1953   LABSPEC 1.017 01/25/2010 1953   PHURINE 5.5 01/25/2010 1953   GLUCOSEU 100 (A) 01/25/2010 1953   HGBUR SMALL (A) 01/25/2010 1953   BILIRUBINUR NEGATIVE 01/25/2010 [place] NEGATIVE 01/25/2010 1953   PROTEINUR >300 (A) 01/25/2010 1953   UROBILINOGEN 0.2 01/25/2010 1953   NITRITE NEGATIVE 01/25/2010 1953   LEUKOCYTESUR NEGATIVE 01/25/2010 1953   Sepsis Labs Invalid input(s): PROCALCITONIN,  WBC,  LACTICIDVEN Microbiology Recent Results (from the past 240 hour(s))  Blood Culture (routine x 2)     Status: None   Collection Time: 05/25/19 11:24 AM   Specimen: BLOOD  Result Value Ref Range  Status   Specimen Description BLOOD SITE NOT SPECIFIED  Final   Special Requests   Final    BOTTLES DRAWN AEROBIC AND ANAEROBIC Blood Culture adequate volume   Culture   Final    NO GROWTH 5 DAYS Performed at [facility], [address], [place], [place] [address]    Report Status 05/30/2019 FINAL  Final  SARS CORONAVIRUS 2 (TAT 6-24 HRS) Nasopharyngeal Nasopharyngeal Swab     Status: None   Collection Time: 05/25/19 11:47 AM   Specimen: Nasopharyngeal Swab  Result Value Ref Range Status   SARS Coronavirus 2 NEGATIVE NEGATIVE Final    Comment: (NOTE) SARS-CoV-2 target nucleic acids are NOT DETECTED. The SARS-CoV-2 RNA is generally detectable in upper and lower respiratory specimens during the acute phase of infection. Negative results do not preclude SARS-CoV-2 infection, do not rule out co-infections with other pathogens, and should not be used as the sole basis for treatment or other patient management decisions. Negative results must be combined with clinical observations, patient history, and epidemiological information. The expected result is Negative. Fact Sheet for Patients: SugarRoll.be Fact Sheet for Healthcare Providers: https://www.woods-mathews.com/ This test is not yet approved or cleared by the [place] FDA and  has been authorized for detection and/or diagnosis of SARS-CoV-2 by FDA under an Emergency Use Authorization (EUA). This EUA  will remain  in effect (meaning this test can be used) for the duration of the COVID-19 declaration under Section 56 4(b)(1) of the Act, 21 U.S.C. section 360bbb-3(b)(1), unless the authorization is terminated or revoked sooner. Performed at [facility], [place] [address] [place], [place] [address]   Blood Culture (routine x 2)     Status: None   Collection Time: 05/25/19  2:18 PM   Specimen: BLOOD RIGHT HAND  Result Value Ref Range Status   Specimen Description BLOOD RIGHT  HAND  Final   Special Requests   Final    BOTTLES DRAWN AEROBIC AND ANAEROBIC Blood Culture adequate volume   Culture   Final    NO GROWTH 5 DAYS Performed at [facility], [place] [address], [place], [place] [address]    Report Status 05/30/2019 FINAL  Final  Body fluid culture (includes gram stain)     Status: None   Collection Time: 05/25/19  6:46 PM   Specimen: Pleural Fluid  Result Value Ref Range Status   Specimen Description PLEURAL  Final   Special Requests NONE  Final   Gram Stain   Final    FEW WBC PRESENT,BOTH PMN AND MONONUCLEAR NO ORGANISMS SEEN    Culture   Final    NO GROWTH 3 DAYS Performed at [facility], [address], [place], [place] [address]    Report Status 05/29/2019 FINAL  Final  MRSA PCR Screening     Status: None   Collection Time: 05/26/19 12:06 AM   Specimen: Nasopharyngeal  Result Value Ref Range Status   MRSA by PCR NEGATIVE NEGATIVE Final    Comment:        The GeneXpert MRSA Assay (FDA approved for NASAL specimens only), is one component of a comprehensive MRSA colonization surveillance program. It is not intended to diagnose MRSA infection nor to guide or monitor treatment for MRSA infections. Performed at [facility], [place] [address] [place], [place] [address]      Time coordinating discharge: 35 minutes  SIGNED:   Aline August, MD  [facility] 06/01/2019, 9:37 AM

## 2019-06-01 NOTE — Progress Notes (Signed)
   NAME:  Shawn [name], MRN:  [id number], DOB:  1960/12/02, LOS: 7 ADMISSION DATE:  05/25/2019, CONSULTATION DATE:  05/25/19 REFERRING MD:  ER, CHIEF COMPLAINT:  AMS   Brief History   58 year old man with hx of HD dependent ESRD presenting with syncopal episode in HD.  History of present illness   58 year old man with hx of HD dependent ESRD presenting with syncopal episode in HD.  Noted to be bradycardic, hypoatnsive brought to ER externally paced.  Seen in ER, pacing turned off and Bps stabilized.  Noted to be hypoxemic and hypercarbic.  PCCM asked to admit.  Past Medical History  CVA, DM, HTN, ESRD, Blind  Significant Hospital Events   11/16 Admitted 11/17 CRRT started, BiPAP temporarily for pure resp acidosis.   Consults:  Cardiology  Procedures:  11/16 R thoracentesis  Significant Diagnostic Tests:  CXR bilateral effusions, R>L CT chest 11/18 > large b/l effusions with atx.  Underlying consolidation or mass not excluded.  Multiple enlarged mediastinal nodes.  Micro Data:  Pleural fluid 11/18 > Blood cx 11/16 > COVID > negative  Antimicrobials:  Vanc 11/16  > 11/17 Zosyn 11/16 > 11/18  Interim history/subjective:  No complaints. "feeling better".  Objective   Blood pressure 113/69, pulse 80, temperature 97.9 F (36.6 C), temperature source Oral, resp. rate 16, height 6' (1.829 m), weight 74.5 kg, SpO2 95 %.        Intake/Output Summary (Last 24 hours) at 06/01/2019 0912 Last data filed at 06/01/2019 0549 Gross per 24 hour  Intake 180 ml  Output 2301 ml  Net -2121 ml   Filed Weights   05/31/19 0710 05/31/19 1130 06/01/19 0500  Weight: 75.9 kg 73.2 kg 74.5 kg   Physical Exam: General: Adult male, chronically ill-appearing HENT: [place]/AT,  EOM-I and MMM Respiratory: CTAB, diminished bases Cardiovascular: RRR, no M/R/G GI: Soft, bowel sounds appreciated Extremities: -edema and -tenderness Neuro: Alert and interactive, moving all ext to command  Skin: Intact, no rashes or bruising   Assessment & Plan:   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 Extensive adenopathy - ? Reactive vs lymphoproliferative process (especially in light of elevated pleural LDH). - Continue volume removal per HD as tolerated - Dietary consult (low fat, high protein) - Repeat chest CT in 2 - 3 months - If persists, can consider EBUS.  End-stage renal disease on hemodialysis - Continue HD per nephrology  Rest per primary team.  Best practice:  Diet: Nutrition consult placed Pain/Anxiety/Delirium protocol (if indicated): None VAP protocol SQH GI prophylaxis: NA Glucose control: SSI Mobility: BR Code Status: Full Family Communication: Patient updated 11/20 Disposition:  Progressive   Shawn [name], [facility] 06/01/2019, 9:40 AM

## 2019-06-01 NOTE — Progress Notes (Signed)
Pt given discharge instructions, prescriptions, and care notes. Pt verbalized understanding AEB no further questions or concerns at this time. IV was discontinued, no redness, pain, or swelling noted at this time. Telemetry discontinued and Centralized Telemetry was notified. Pt left the floor via wheelchair with staff in stable condition. 

## 2019-06-01 NOTE — TOC Initial Note (Addendum)
Transition of Care [facility]) - Initial/Assessment Note    Patient Details  Name: Shawn [name] MRN: [id number] Date of Birth: 03/06/1961  [facility]) CM/SW Contact:    Shawn Halsted, LCSW Phone Number: 06/01/2019, 11:59 AM  Clinical Narrative:                 CSW received consult for possible home health services at time of discharge. CSW spoke with patient regarding PT recommendation of Home Health PT at time of discharge. Patient reported that he would like home health services. Patient reports preference for [facility]. CSW sent referral for review. CSW provided Medicare [place] ratings list. CSW discussed equipment needs with patient and he requested a rolling walker with seat. CSW reached out to [employer] for delivery to the room. CSW confirmed with patient's sister that the walker will fit in her car. CSW confirmed PCP Shawn [name]) and address with patient. Patient states his sister will come pick him up at discharge. He is aware that he will have dialysis tomorrow and Friday at [facility] due to the holiday schedule. No further questions reported at this time.    Expected Discharge Plan: [place] Barriers to Discharge: No Barriers Identified   Patient Goals and CMS Choice Patient states their goals for this hospitalization and ongoing recovery are:: Get stronger CMS Medicare.gov Compare Post Acute Care list provided to:: Patient Choice offered to / list presented to : Patient  Expected Discharge Plan and Services Expected Discharge Plan: [place] In-house Referral: NA Discharge Planning Services: CM Consult Post Acute Care Choice: Home Health, Durable Medical Equipment Living arrangements for the past 2 months: Apartment Expected Discharge Date: 06/01/19               DME Arranged: Gilford Rile rolling with seat DME Agency: [employer] Date DME Agency Contacted: 06/01/19 Time DME Agency Contacted: [id number] Representative spoke with  at DME Agency: Shawn [name] Arranged: PT, OT          Prior Living Arrangements/Services Living arrangements for the past 2 months: [place] with:: Spouse Patient language and need for interpreter reviewed:: Yes Do you feel safe going back to the place where you live?: Yes      Need for Family Participation in Patient Care: Yes (Comment) Care giver support system in place?: Yes (comment)   Criminal Activity/Legal Involvement Pertinent to Current Situation/Hospitalization: No - Comment as needed  Activities of Daily Living Home Assistive Devices/Equipment: Cane (specify quad or straight)(pt unsure) ADL Screening (condition at time of admission) Patient's cognitive ability adequate to safely complete daily activities?: Yes Is the patient deaf or have difficulty hearing?: No Does the patient have difficulty seeing, even when wearing glasses/contacts?: Yes Does the patient have difficulty concentrating, remembering, or making decisions?: No Patient able to express need for assistance with ADLs?: Yes Does the patient have difficulty dressing or bathing?: No Independently performs ADLs?: Yes (appropriate for developmental age) Does the patient have difficulty walking or climbing stairs?: Yes(pt chooses not to navigate stairs) Weakness of Legs: Both Weakness of Arms/Hands: Both  Permission Sought/Granted Permission sought to share information with : Facility Art therapist granted to share information with : Yes, Verbal Permission Granted     Permission granted to share info w AGENCY: Home Health        Emotional Assessment Appearance:: Appears stated age Attitude/Demeanor/Rapport: Engaged, Gracious Affect (typically observed): Accepting, Pleasant, Appropriate Orientation: : Oriented to Self, Oriented to Place, Oriented  to  Time, Oriented to Situation Alcohol / Substance Use: Not Applicable Psych Involvement: No (comment)  Admission diagnosis:  Somnolence  [R40.0] Bradycardia [R00.1] S/P thoracentesis [Z98.890] ESRD (end stage renal disease) on dialysis ([place]) [N18.6, Z99.2] Acute hypercapnic respiratory failure (HCC) [J96.02] Patient Active Problem List   Diagnosis Date Noted  . Acute respiratory failure with hypercapnia ([place]) 05/25/2019  . Acute hypercapnic respiratory failure ([place]) 05/25/2019  . History of CVA (cerebrovascular accident) 05/05/2019  . Pure hypercholesterolemia 05/05/2019  . Pain due to onychomycosis of toenails of both feet 12/23/2018  . Heart palpitations 12/21/2017  . Tingling of left upper extremity 12/21/2017  . Syncope 01/23/2016  . Hypertension 01/23/2016  . Orthostatic hypotension 01/23/2016  . Legally blind   . Diabetes ([place])   . Diabetes mellitus with complication ([place])   . Intracranial atherosclerosis 08/10/2014  . End stage renal disease ([place]) 06/12/2013  . ESRD on dialysis ([place]) 04/17/2013  . Cerebral artery occlusion with cerebral infarction ([place]) 04/02/2013  . Dizziness and giddiness 04/02/2013   PCP:  Shawn Liberty, MD Pharmacy:   [facility] [place] [facility]), [place] - [address] [place] [address] Phone: [phone] Fax: [phone]     Social Determinants of Health (SDOH) Interventions    Readmission Risk Interventions No flowsheet data found.

## 2019-06-01 NOTE — Progress Notes (Addendum)
Subjective:  siting  Up in chair  No sob no cos , HD yest  [place] On Holiday schedule Sun, Tues  Fri/ obtained stand wt scale = 72.9 kg   Objective Vital signs in last 24 hours: Vitals:   06/01/19 0025 06/01/19 0424 06/01/19 0500 06/01/19 0838  BP: 119/65 122/66  113/69  Pulse: 91 75  80  Resp: 16 16  16   Temp: 98.8 F (37.1 C) 98.6 F (37 C)  97.9 F (36.6 C)  TempSrc:  Oral  Oral  SpO2: 97% 98%  95%  Weight:   74.5 kg   Height:       Weight change:   Physical Exam General: Blind,Alert/orient,thin male , NAD. Mar[place] Kitchen Heart: RRR; 2/6 murmur Lungs: CTA anteriorly and laterally Abdomen: BS pos, soft, non-tender, non distended  Extremities: No LE edema Dialysis Access: TDC in R chest  OP Dialysis Orders: [facility] M[facility] 4:15h   86.5kg   3K/2Ca   RIJ TDC   Heparin 6000  - Calcitriol 0.2mcg PO q HD - Venofer 50 mg q week - No ESA, Last OP HGB 13.9 05/20/19  Problem/Plan:  1. [place] .effusions: on admit - s/p R thora 11/16 with 1.5L milky brown fluid removed - Cx negative. Prev on Zosyn, now off. COVID neg. Off oxygen at this time and transferred to floor. Looks much improved. ready fro dc  From renal aspect  2. ESRD: Usual MWF sched. Prior hypotensive episode after HD - required CRRT 11/17-11/20 d/t volume overload/hypotension. Now  On Holiday schedule Sun, Tues  Fri  3. Anemia: Hgb remains > 12. No ESA needed. 4. Secondary hyperparathyroidism: Ca ok, Phos low but improving 2.4  (s/p replacement 11/21). Resumed VDRA. Corec Ca 9.5 5. HTN/volume: Was on multiple antihypertensives as an OP- all been stopped. Req transient pressor support and midodrine, now off- BP better although still low. He is well below prior EDW by bed weight -this am obtained stand wt scale = 72.9 kg /new  EDW  = 73  6. Nutrition: Alb low - start pro-stat 7. Bradycardia on admit requiring transcutaneous pacing - resolved now.  Ernest Haber, PA-C [place] [phone] 06/01/2019,9:31  AM  LOS: 7 days   Pt seen, examined and agree w A/P as above.  Kelly Splinter  MD 06/01/2019, 5:00 PM   Labs: Basic Metabolic Panel: Recent Labs  Lab 05/29/19 1552 05/30/19 0350 05/31/19 0241 06/01/19 0302  NA 133* 134* 135 135  K 4.3 4.1 3.7 3.5  CL 100 102 101 101  CO2 23 22 25 26   GLUCOSE 106* 97 156* 166*  BUN 7 7 13 15   CREATININE 1.78* 1.50* 2.61* 2.48*  CALCIUM 8.7* 8.7* 9.0 8.7*  PHOS 1.9* 1.2* 2.4*  --    Liver Function Tests: Recent Labs  Lab 05/25/19 1216  05/29/19 0435 05/29/19 1552 05/30/19 0350  AST 23  --   --   --   --   ALT 15  --   --   --   --   ALKPHOS 103  --   --   --   --   BILITOT 1.4*  --   --   --   --   PROT 7.1  --   --   --   --   ALBUMIN 3.5   < > 3.0* 3.0* 2.9*   < > = values in this interval not displayed.   No results for input(s): LIPASE, AMYLASE in the last 168 hours.  Recent Labs  Lab 05/25/19 1217  AMMONIA 41*   CBC: Recent Labs  Lab 05/28/19 0144 05/29/19 0435 05/30/19 0350 05/31/19 0241 06/01/19 0302  WBC 11.5* 9.6 6.0 6.7 4.4  NEUTROABS 9.3* 7.7 4.7  --  2.9  HGB 13.4 13.4 13.4 13.0 12.7*  HCT 42.6 42.4 42.1 39.1 38.0*  MCV 94.5 95.3 92.7 90.5 90.9  PLT 211 181 161 174 161   Cardiac Enzymes: No results for input(s): CKTOTAL, CKMB, CKMBINDEX, TROPONINI in the last 168 hours. CBG: Recent Labs  Lab 05/31/19 1610 05/31/19 2052 06/01/19 0027 06/01/19 0425 06/01/19 0814  GLUCAP 125* 141* 172* 140* 127*    Studies/Results: No results found. Medications:  . aspirin  325 mg Oral Daily  . brimonidine  1 drop Both Eyes BID  . calcitRIOL  0.5 mcg Oral 3 times weekly  . Chlorhexidine Gluconate Cloth  6 each Topical Daily  . Chlorhexidine Gluconate Cloth  6 each Topical Q0600  . Chlorhexidine Gluconate Cloth  6 each Topical Q0600  . dorzolamide-timolol  1 drop Both Eyes BID  . feeding supplement (PRO-STAT SUGAR FREE 64)  30 mL Oral BID  . heparin  5,000 Units Subcutaneous Q8H  . latanoprost  1 drop Both Eyes  QHS  . metoprolol tartrate  25 mg Oral BID  . polyethylene glycol  17 g Oral QHS

## 2019-06-01 NOTE — Care Management Important Message (Signed)
Important Message  Patient Details  Name: Shawn [name] MRN: [id number] Date of Birth: 1960-07-14   Medicare Important Message Given:  Yes     [name] [name] 06/01/2019, 3:14 PM

## 2019-06-01 NOTE — Progress Notes (Signed)
Physical Therapy Treatment Patient Details Name: Shawn [name] MRN: [id number] DOB: 05-28-1961 Today's Date: 06/01/2019    History of Present Illness Pt is a 58 year old man with hx of HD dependent ESRD presenting with syncopal episode in HD. Pt with BiL Pleural effusions and s/p thoracentesis 05/25/19. PMHx: CVA, DM, HTN, ESRD, blind. Pt on CRRT/HD.    PT Comments    Pt with improved ambulation today with RW. Pt with strong tendency to vear R requiring minA for walker management as pt is blind. Pt states "this is a lot longer than I usually walk." Pt typically is in home ambulator only, doesn't do much in community since his vision became impaired. Acute PT to cont to follow.    Follow Up Recommendations  Home health PT;Supervision/Assistance - 24 hour     Equipment Recommendations  Rolling walker with 5" wheels    Recommendations for Other Services       Precautions / Restrictions Precautions Precautions: Fall;Other (comment) Precaution Comments: blindness Restrictions Weight Bearing Restrictions: No    Mobility  Bed Mobility               General bed mobility comments: pt up in chair  Transfers Overall transfer level: Needs assistance Equipment used: Rolling walker (2 wheeled) Transfers: Sit to/from Stand Sit to Stand: Min guard         General transfer comment: pt pushed up from arm rests of chair, increased time, min guard to steady during transition of hands and to guide due to pt blind  Ambulation/Gait Ambulation/Gait assistance: Min assist Gait Distance (Feet): 150 Feet Assistive device: 2 person hand held assist Gait Pattern/deviations: Step-through pattern;Decreased stride length;Narrow base of support;Scissoring Gait velocity: decresaed Gait velocity interpretation: <1.8 ft/sec, indicate of risk for recurrent falls General Gait Details: minA for walker management as pt with strong tendency to vear R   Stairs              Wheelchair Mobility    Modified Rankin (Stroke Patients Only)       Balance Overall balance assessment: Needs assistance Sitting-balance support: Feet supported Sitting balance-Leahy Scale: Good     Standing balance support: Bilateral upper extremity supported Standing balance-Leahy Scale: Poor                              Cognition Arousal/Alertness: Awake/alert Behavior During Therapy: WFL for tasks assessed/performed Overall Cognitive Status: Within Functional Limits for tasks assessed                                        Exercises      General Comments General comments (skin integrity, edema, etc.): VSS      Pertinent Vitals/Pain Pain Assessment: No/denies pain    Home Living                      Prior Function            PT Goals (current goals can now be found in the care plan section) Progress towards PT goals: Progressing toward goals    Frequency    Min 3X/week      PT Plan Current plan remains appropriate    Co-evaluation              AM-PAC PT "6 Clicks" Mobility   Outcome Measure  Help needed turning from your back to your side while in a flat bed without using bedrails?: None Help needed moving from lying on your back to sitting on the side of a flat bed without using bedrails?: A Little Help needed moving to and from a bed to a chair (including a wheelchair)?: A Little Help needed standing up from a chair using your arms (e.g., wheelchair or bedside chair)?: A Little Help needed to walk in hospital room?: A Little Help needed climbing 3-5 steps with a railing? : A Little 6 Click Score: 19    End of Session Equipment Utilized During Treatment: Gait belt Activity Tolerance: Patient tolerated treatment well Patient left: in chair;with call bell/phone within reach;with chair alarm set Nurse Communication: Mobility status PT Visit Diagnosis: Unsteadiness on feet (R26.81);Muscle weakness  (generalized) (M62.81);Difficulty in walking, not elsewhere classified (R26.2)     Time: [id number] PT Time Calculation (min) (ACUTE ONLY): 14 min  Charges:  $Gait Training: 8-22 mins                     Kittie Plater, PT, DPT Acute Rehabilitation Services Pager #: [phone] Office #: [phone]    Berline Lopes 06/01/2019, 10:41 AM

## 2019-06-01 NOTE — Discharge Instructions (Signed)
Low-fat, High protein diet

## 2019-06-02 ENCOUNTER — Telehealth: Payer: Self-pay | Admitting: Nephrology

## 2019-06-02 DIAGNOSIS — N2581 Secondary hyperparathyroidism of renal origin: Secondary | ICD-10-CM | POA: Diagnosis not present

## 2019-06-02 DIAGNOSIS — D509 Iron deficiency anemia, unspecified: Secondary | ICD-10-CM | POA: Diagnosis not present

## 2019-06-02 DIAGNOSIS — N186 End stage renal disease: Secondary | ICD-10-CM | POA: Diagnosis not present

## 2019-06-02 DIAGNOSIS — E119 Type 2 diabetes mellitus without complications: Secondary | ICD-10-CM | POA: Diagnosis not present

## 2019-06-02 DIAGNOSIS — Z992 Dependence on renal dialysis: Secondary | ICD-10-CM | POA: Diagnosis not present

## 2019-06-02 DIAGNOSIS — D631 Anemia in chronic kidney disease: Secondary | ICD-10-CM | POA: Diagnosis not present

## 2019-06-02 NOTE — Telephone Encounter (Signed)
Transition of Care Contact from [place]   Date of Discharge: 06/01/2019 Date of Contact: 06/02/2019 Method of contact: phone Talked to patient   Patient contacted to discuss transition of care form recent hospitaliztion. Patient was admitted to [facility] from 05/25/2019 to 06/01/2019 with the discharge diagnosis of acute hypercarbic respiratory failure likley 2/2 volume overload, b/l pleural effusions and R chylothorax s/p R sided thoracentesis on 11/18, and bradycardia requiring external pacing.  Required CRRT for volume removal and BIPAP.  Medication changes were reviewed.  Patient will follow up with is outpatient dialysis center 06/02/2019.  Other follow up needs include cardiology & pulmonology follow up.     Jen Mow, PA-C [facility] Pager: [phone]

## 2019-06-05 DIAGNOSIS — D509 Iron deficiency anemia, unspecified: Secondary | ICD-10-CM | POA: Diagnosis not present

## 2019-06-05 DIAGNOSIS — D631 Anemia in chronic kidney disease: Secondary | ICD-10-CM | POA: Diagnosis not present

## 2019-06-05 DIAGNOSIS — N2581 Secondary hyperparathyroidism of renal origin: Secondary | ICD-10-CM | POA: Diagnosis not present

## 2019-06-05 DIAGNOSIS — Z992 Dependence on renal dialysis: Secondary | ICD-10-CM | POA: Diagnosis not present

## 2019-06-05 DIAGNOSIS — N186 End stage renal disease: Secondary | ICD-10-CM | POA: Diagnosis not present

## 2019-06-05 DIAGNOSIS — E119 Type 2 diabetes mellitus without complications: Secondary | ICD-10-CM | POA: Diagnosis not present

## 2019-06-08 ENCOUNTER — Other Ambulatory Visit: Payer: Self-pay | Admitting: *Deleted

## 2019-06-08 DIAGNOSIS — D509 Iron deficiency anemia, unspecified: Secondary | ICD-10-CM | POA: Diagnosis not present

## 2019-06-08 DIAGNOSIS — N2581 Secondary hyperparathyroidism of renal origin: Secondary | ICD-10-CM | POA: Diagnosis not present

## 2019-06-08 DIAGNOSIS — E119 Type 2 diabetes mellitus without complications: Secondary | ICD-10-CM | POA: Diagnosis not present

## 2019-06-08 DIAGNOSIS — D631 Anemia in chronic kidney disease: Secondary | ICD-10-CM | POA: Diagnosis not present

## 2019-06-08 DIAGNOSIS — N186 End stage renal disease: Secondary | ICD-10-CM | POA: Diagnosis not present

## 2019-06-08 DIAGNOSIS — Z992 Dependence on renal dialysis: Secondary | ICD-10-CM | POA: Diagnosis not present

## 2019-06-08 NOTE — Patient Outreach (Signed)
Red EMMI general discharge received for 06/03/19 Day#4, Sad, hopeless, empty, anxious- yes.   Other questions- yes.  Outreach call to pt to address and no answer to telephone, line busy.  RN CM mailed unsuccessful outreach letter to pt home.  PLAN Outreach pt in 3-4 business days  [facility], [place] [phone]

## 2019-06-09 DIAGNOSIS — I6990 Unspecified sequelae of unspecified cerebrovascular disease: Secondary | ICD-10-CM | POA: Diagnosis not present

## 2019-06-09 DIAGNOSIS — R55 Syncope and collapse: Secondary | ICD-10-CM | POA: Diagnosis not present

## 2019-06-09 DIAGNOSIS — E1165 Type 2 diabetes mellitus with hyperglycemia: Secondary | ICD-10-CM | POA: Diagnosis not present

## 2019-06-09 DIAGNOSIS — E7800 Pure hypercholesterolemia, unspecified: Secondary | ICD-10-CM | POA: Diagnosis not present

## 2019-06-09 DIAGNOSIS — I999 Unspecified disorder of circulatory system: Secondary | ICD-10-CM | POA: Diagnosis not present

## 2019-06-09 DIAGNOSIS — E559 Vitamin D deficiency, unspecified: Secondary | ICD-10-CM | POA: Diagnosis not present

## 2019-06-09 DIAGNOSIS — H547 Unspecified visual loss: Secondary | ICD-10-CM | POA: Diagnosis not present

## 2019-06-09 DIAGNOSIS — N186 End stage renal disease: Secondary | ICD-10-CM | POA: Diagnosis not present

## 2019-06-09 DIAGNOSIS — I119 Hypertensive heart disease without heart failure: Secondary | ICD-10-CM | POA: Diagnosis not present

## 2019-06-10 DIAGNOSIS — E441 Mild protein-calorie malnutrition: Secondary | ICD-10-CM | POA: Insufficient documentation

## 2019-06-10 NOTE — Progress Notes (Signed)
Virtual Visit via Telephone Note   This visit type was conducted due to national recommendations for restrictions regarding the COVID-19 Pandemic (e.g. social distancing) in an effort to limit this patient's exposure and mitigate transmission in our community.  Due to his co-morbid illnesses, this patient is at least at moderate risk for complications without adequate follow up.  This format is felt to be most appropriate for this patient at this time.  The patient did not have access to video technology/had technical difficulties with video requiring transitioning to audio format only (telephone).  All issues noted in this document were discussed and addressed.  No physical exam could be performed with this format.  Please refer to the patient's chart for his  consent to telehealth for [facility].   Date:  06/16/2019   ID:  [name], DOB 1960-09-10, MRN [id number]  Patient Location: Home Provider Location: Home  PCP:  Vincente Liberty, MD  Cardiologist:  Buford Dresser, MD  Electrophysiologist:  None   Evaluation Performed:  Follow-Up Visit  Chief Complaint:  Post Hospitalization follow up  History of Present Illness:    Silvestre Waddy is a 58 y.o. male who presents for post hospitalization follow up, after admission for syncopal episode and hypotension during dialysis. He was also found to be bradycardic.  He was seen in consultation by Dr. Marlou Porch. He has a hx of ESRD, TIA,. He was transported to ED via EMS with external pacing. Dr.Skains did not see any evidence of true AV block or significant bradycardia. His metoprolol was discontinued, which he was taking for palpitations. Dr. Marlou Porch felt that his LOC was related to hypotension. He was to continue amlodipine and ASA. However on discharge this was discontinued. He is taking metoprolol prn only.   He reports that he is off of all of his antihypertensive medications as his BP remained low.  Much better now.  Only occasionally takes metoprolol 12.5 mg for elevated HR or BP. Has been seen by his PCP and nephrology. Has been stable. He denies any further episodes of dizziness, near syncope or hypotension.   The patient does not have symptoms concerning for COVID-19 infection (fever, chills, cough, or new shortness of breath).    Past Medical History:  Diagnosis Date  . Anemia   . Arthritis    patient denies  . Blind right eye   . Chronic kidney disease    Dialysis since 2011 MWF  . Complication of anesthesia   . Diabetes mellitus    type II  . Hypertension   . Legally blind   . No pertinent past medical history   . PONV (postoperative nausea and vomiting)    N/V- became dehydrated had to have IV fluids- 01/2015  . Shortness of breath dyspnea    when has too fluid  . Stroke [facility]) 2012   date per patient  . Syncope   . Unspecified cerebral artery occlusion with cerebral infarction 04/02/2013   Past Surgical History:  Procedure Laterality Date  . AV FISTULA PLACEMENT Right 05/12/2013   Procedure: INSERTION OF ARTERIOVENOUS (AV) GORE-TEX GRAFT ARM; ULTRASOUND GUIDED;  Surgeon: Conrad Arroyo [place], MD;  Location: [facility];  Service: Vascular;  Laterality: Right;  . AV FISTULA PLACEMENT Left 01/18/2015   Procedure: INSERTION OF ARTERIOVENOUS GORE-TEX GRAFT LEFT UPPER ARM;  Surgeon: Conrad Baraboo, MD;  Location: [facility];  Service: Vascular;  Laterality: Left;  . COLONOSCOPY  06/2012  . EYE SURGERY Bilateral    retina surgery both  eyes, cataract surgery both eyes  . HERNIA REPAIR     right inguinal  . INSERTION OF DIALYSIS CATHETER Right   . left arm graft  10/2010  . LIGATION ARTERIOVENOUS GORTEX GRAFT Left 05/03/2015   Procedure: LIGATION ARTERIOVENOUS GORTEX GRAFT-LEFT UPPER ARM;  Surgeon: Conrad Bokeelia, MD;  Location: [facility];  Service: Vascular;  Laterality: Left;     Current Meds  Medication Sig  . aspirin EC 325 MG tablet Take 325 mg by mouth at bedtime.  Marland Kitchen atorvastatin (LIPITOR) 40 MG tablet Take  1 tablet (40 mg total) by mouth daily.  Marland Kitchen b complex vitamins tablet Take 1 tablet by mouth at bedtime.  . brimonidine (ALPHAGAN) 0.2 % ophthalmic solution Place 1 drop into both eyes 2 (two) times daily.   . calcitRIOL (ROCALTROL) 0.5 MCG capsule Take 1 capsule (0.5 mcg total) by mouth 3 (three) times a week.  . diphenhydramine-acetaminophen (TYLENOL PM) 25-500 MG TABS tablet Take 1 tablet by mouth at bedtime as needed (pain).  . dorzolamide-timolol (COSOPT) 22.3-6.8 MG/ML ophthalmic solution Place 1 drop into both eyes 2 (two) times daily.   Marland Kitchen lanthanum (FOSRENOL) 1000 MG chewable tablet Chew 1,000 mg by mouth 3 (three) times daily with meals.  . latanoprost (XALATAN) 0.005 % ophthalmic solution Place 1 drop into both eyes at bedtime.   . metoprolol tartrate (LOPRESSOR) 25 MG tablet Take 25 mg by mouth 2 (two) times daily.     Allergies:   Patient has no known allergies.   Social History   Tobacco Use  . Smoking status: Never Smoker  . Smokeless tobacco: Never Used  Substance Use Topics  . Alcohol use: No    Alcohol/week: 0.0 standard drinks  . Drug use: No     Family Hx: The patient's family history includes Alzheimer's disease in his mother; Cancer in his father and sister; Diabetes in his mother; Heart disease in his father; Stroke in his sister.  ROS:   Please see the history of present illness.    All other systems reviewed and are negative.   Prior CV studies:   The following studies were reviewed today: Echocardiogram 12/22/2017 Left ventricle: The cavity size was normal. There was mild   concentric hypertrophy. Systolic function was mildly reduced. The   estimated ejection fraction was in the range of 45% to 50%. Mild   diffuse hypokinesis. Doppler parameters are consistent with   abnormal left ventricular relaxation (grade 1 diastolic   dysfunction). Doppler parameters are consistent with high   ventricular filling pressure. - Aortic valve: Transvalvular velocity  was within the normal range.   There was no stenosis. There was no regurgitation. Valve area   (VTI): 2.17 cm^2. Valve area (Vmax): 2.16 cm^2. Valve area   (Vmean): 2.14 cm^2. - Mitral valve: Transvalvular velocity was within the normal range.   There was no evidence for stenosis. There was trivial   regurgitation. - Left atrium: The atrium was mildly dilated. - Right ventricle: The cavity size was normal. Wall thickness was   normal. Systolic function was normal. - Tricuspid valve: There was trivial regurgitation. - Pulmonary arteries: Systolic pressure was within the normal   range. PA peak pressure: 35 mm Hg (S).  Labs/Other Tests and Data Reviewed:    EKG:  No ECG reviewed.  Recent Labs: 05/25/2019: ALT 15 05/31/2019: Magnesium 2.4 06/01/2019: BUN 15; Creatinine, Ser 2.48; Hemoglobin 12.7; Platelets 161; Potassium 3.5; Sodium 135   Recent Lipid Panel Lab Results  Component Value Date/Time  CHOL (H) 01/26/2010 05:20 AM    207        ATP III CLASSIFICATION:  <200     mg/dL   Desirable  200-239  mg/dL   Borderline High  >=240    mg/dL   High          TRIG 115 01/26/2010 05:20 AM   HDL 39 (L) 01/26/2010 05:20 AM   CHOLHDL 5.3 01/26/2010 05:20 AM   LDLCALC (H) 01/26/2010 05:20 AM    145        Total Cholesterol/HDL:CHD Risk Coronary Heart Disease Risk Table                     Men   Women  1/2 Average Risk   3.4   3.3  Average Risk       5.0   4.4  2 X Average Risk   9.6   7.1  3 X Average Risk  23.4   11.0        Use the calculated Patient Ratio above and the CHD Risk Table to determine the patient's CHD Risk.        ATP III CLASSIFICATION (LDL):  <100     mg/dL   Optimal  100-129  mg/dL   Near or Above                    Optimal  130-159  mg/dL   Borderline  160-189  mg/dL   High  >190     mg/dL   Very High    Wt Readings from Last 3 Encounters:  06/16/19 160 lb (72.6 kg)  06/15/19 162 lb (73.5 kg)  06/01/19 164 lb 3.9 oz (74.5 kg)     Objective:     Vital Signs:  BP (!) 132/57   Pulse 98   Ht 6' (1.829 m)   Wt 160 lb (72.6 kg)   BMI 21.70 kg/m    VITAL SIGNS:  reviewed GEN:  no acute distress RESPIRATORY:  normal respiratory effort, symmetric expansion PSYCH:  normal affect  ASSESSMENT & PLAN:    1. Syncope: Related to hypotension in the setting of over dialysis. Now off of all antihypertensive medications. He is doing much better and is without further episodes.   2. Bradycardia: Metoprolol is only being taken at 12.5 mg prn for elevated HR or BP greater than [id number] systolic. He has only taken it once or twice. Followed closely by his nephrologist.   3. ESRD: He continues dialysis three times a week.  Labs per nephrology.     COVID-19 Education: The signs and symptoms of COVID-19 were discussed with the patient and how to seek care for testing (follow up with PCP or arrange E-visit). The importance of social distancing was discussed today.  Time:   Today, I have spent 15 minutes with the patient with telehealth technology discussing the above problems.     Medication Adjustments/Labs and Tests Ordered: Current medicines are reviewed at length with the patient today.  Concerns regarding medicines are outlined above.   Tests Ordered: No orders of the defined types were placed in this encounter.   Medication Changes: No orders of the defined types were placed in this encounter.   Disposition:  Follow up 6 months.  Signed, Phill Myron. West Pugh, ANP, AACC  06/16/2019 10:15 AM    [facility]

## 2019-06-11 ENCOUNTER — Other Ambulatory Visit: Payer: Self-pay | Admitting: *Deleted

## 2019-06-11 NOTE — Patient Outreach (Signed)
Outreach call to pt/ 2nd attempt to follow up on Red EMMI, patient's spouse answered the phone and states pt is not at home and ask that RN CM call back at a later time or date.  PLAN Outreach pt in 3-4 business days  Jacqlyn Larsen [facility], [place] [phone]

## 2019-06-15 ENCOUNTER — Encounter: Payer: Self-pay | Admitting: Pulmonary Disease

## 2019-06-15 ENCOUNTER — Inpatient Hospital Stay: Payer: Medicare Other | Admitting: Pulmonary Disease

## 2019-06-15 ENCOUNTER — Ambulatory Visit (INDEPENDENT_AMBULATORY_CARE_PROVIDER_SITE_OTHER): Payer: Medicare Other

## 2019-06-15 ENCOUNTER — Ambulatory Visit (INDEPENDENT_AMBULATORY_CARE_PROVIDER_SITE_OTHER): Payer: Medicare Other | Admitting: Pulmonary Disease

## 2019-06-15 ENCOUNTER — Other Ambulatory Visit: Payer: Self-pay

## 2019-06-15 VITALS — BP 134/72 | HR 84 | Temp 97.4°F | Ht 72.0 in | Wt 162.0 lb

## 2019-06-15 DIAGNOSIS — J940 Chylous effusion: Secondary | ICD-10-CM | POA: Insufficient documentation

## 2019-06-15 DIAGNOSIS — I898 Other specified noninfective disorders of lymphatic vessels and lymph nodes: Secondary | ICD-10-CM

## 2019-06-15 DIAGNOSIS — N186 End stage renal disease: Secondary | ICD-10-CM

## 2019-06-15 DIAGNOSIS — J90 Pleural effusion, not elsewhere classified: Secondary | ICD-10-CM

## 2019-06-15 DIAGNOSIS — R0602 Shortness of breath: Secondary | ICD-10-CM | POA: Diagnosis not present

## 2019-06-15 DIAGNOSIS — Z9889 Other specified postprocedural states: Secondary | ICD-10-CM | POA: Diagnosis not present

## 2019-06-15 DIAGNOSIS — R918 Other nonspecific abnormal finding of lung field: Secondary | ICD-10-CM

## 2019-06-15 DIAGNOSIS — Z992 Dependence on renal dialysis: Secondary | ICD-10-CM | POA: Diagnosis not present

## 2019-06-15 NOTE — Assessment & Plan Note (Signed)
Doing well Vitals stable at appointment today  Plan: We will repeat chest x-ray

## 2019-06-15 NOTE — Progress Notes (Signed)
@Patient  ID: Shawn [name], male    DOB: 25-Oct-1960, 58 y.o.   MRN: [id number]  Chief Complaint  Patient presents with  . Hospitalization Follow-up    R Pleural effusion     Referring provider: Vincente Liberty, MD  HPI:  58 year old never smoker initially consulted with our practice on 05/25/2019 while being admitted to the hospital following a syncopal episode.  PMH: CVA, DM, HTN, ESRD - MWF, Blind Smoker/ Smoking History: Never Smoker  Maintenance:  None  Pt of: Dr. Valeta Harms  06/15/2019  - Visit   58 year old male initially consulted with our practice on 05/25/2019 while being admitted to the hospital following a syncopal episode.  Patient was also found to have a milky right-sided pleural effusion with question of infection or malignancy or chylothorax.  Patient also is on end-stage renal, patient is also on hemodialysis for end-stage renal disease.  Patient was discharged on 06/01/2019.  With plan to repeat CT chest in 2 to 3 months.  Around January or February 2021.  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  Triglyceride levels were 176.  CT chest with lymphadenopathy.  Fluid cultures and cytology were negative.  Patient presents today reporting he has been doing well since being discharged from the hospital.  He still has some occasional bouts of shortness of breath specifically when he has gas or acid reflux.  This is relieved when he is able to belch.  He feels that he is significantly improved since prior to his hospitalization.  Tests:   CT chest 05/27/2019 > large b/l effusions with atx.  Underlying consolidation or mass not excluded.  Multiple enlarged mediastinal nodes.  05/25/2019-chest x-ray-new large right pleural effusion, potential right lower lobe consolidation, no pneumothorax, central venous congestion  05/25/2019-right thoracentesis - 1500cc Triglycerides-176 Protein-4.4 pH-7.8 LDH-80 Glucose-181 Cholesterol-80 Body solid fluid count-color  yellow, appearance cloudy, 37% lymphs, 57% monocytes macrophages, Cytology-no malignant cells identified  FENO:  No results found for: NITRICOXIDE  PFT: No flowsheet data found.  WALK:  No flowsheet data found.  Imaging: Dg Chest 1 View  Result Date: 05/29/2019 CLINICAL DATA:  Shortness of breath EXAM: CHEST  1 VIEW COMPARISON:  05/26/2019 FINDINGS: Small bilateral pleural effusions, left greater than right. Bilateral diffuse interstitial thickening. No pneumothorax. Stable cardiomediastinal silhouette. Large bore right-sided central venous catheter with the tip projecting over the right atrium. No acute osseous abnormality. IMPRESSION: Bilateral pleural effusions, left greater than right with bilateral interstitial thickening. Electronically Signed   By: Kathreen Devoid   On: 05/29/2019 09:07   Ct Head Wo Contrast  Result Date: 05/25/2019 CLINICAL DATA:  Altered level of consciousness. EXAM: CT HEAD WITHOUT CONTRAST TECHNIQUE: Contiguous axial images were obtained from the base of the skull through the vertex without intravenous contrast. COMPARISON:  October 26, 2010. FINDINGS: Brain: Mild chronic ischemic white matter disease is noted. No mass effect or midline shift is noted. Ventricular size is within normal limits. There is no evidence of mass lesion, hemorrhage or acute infarction. Vascular: No hyperdense vessel or unexpected calcification. Skull: Normal. Negative for fracture or focal lesion. Sinuses/Orbits: No acute finding. Other: None. IMPRESSION: Mild chronic ischemic white matter disease. No acute intracranial abnormality seen. Electronically Signed   By: Marijo Conception M.D.   On: 05/25/2019 12:38   Ct Chest Wo Contrast  Result Date: 05/28/2019 CLINICAL DATA:  Pleural effusion, evaluate for chylothorax EXAM: CT CHEST WITHOUT CONTRAST TECHNIQUE: Multidetector CT imaging of the chest was performed following the standard  protocol without IV contrast. COMPARISON:  CT 01/26/2010  FINDINGS: Cardiovascular: Right IJ approach central venous catheter tip terminates in the right atrium. Heart size at the upper limits of normal. Few coronary artery calcifications are present. Calcifications present upon the aortic leaflets. Aortic caliber is normal. Calcifications are present in the aortic arch. Normal 3 vessel branching pattern of the arch. Central pulmonary artery caliber is upper limits normal size. Luminal evaluation precluded in the absence of contrast. There is prominence of the azygous veins. Mediastinum/Nodes: Multiple enlarged mediastinal nodes are present including a 15 mm lower paratracheal/azygos lymph node (3/68) and a conglomerate subcarinal nodal mass measuring up to 2.1 cm (3/89). No acute abnormality of the trachea or esophagus. Mild heterogeneity of thyroid gland. Lungs/Pleura: There is large volume bilateral pleural effusions measuring relatively simple attenuation (0-5 HU). There is associated atelectatic collapse of the right middle and lower lobes and left lower lobe and partial atelectatic collapse of both upper lobes as well. Underlying consolidation or mass lesion is not excluded. Upper Abdomen: No acute abnormalities present in the visualized portions of the upper abdomen. Musculoskeletal: No acute osseous abnormality or suspicious osseous lesion. Multilevel degenerative changes are present in the imaged portions of the spine. Degenerative changes in both shoulders. Diffuse body wall edema. Moderate right gynecomastia. IMPRESSION: 1. Large volume bilateral pleural effusions with associated atelectatic collapse of the right middle and lower lobes and left lower lobe and partial atelectatic collapse of both upper lobes. Underlying consolidation or mass lesion is not excluded. The composition of these simple density effusions could be determined by fluid sampling. 2. Multiple enlarged mediastinal nodes, nonspecific. 3. Moderate circumferential body wall edema, correlate for  features of anasarca. 4. Gynecomastia. 5. Aortic Atherosclerosis (ICD10-I70.0). Electronically Signed   By: Lovena Le M.D.   On: 05/28/2019 15:34   Dg Chest Port 1 View  Result Date: 05/26/2019 CLINICAL DATA:  Pleural effusions EXAM: PORTABLE CHEST 1 VIEW COMPARISON:  May 25, 2019. FINDINGS: Central catheter tip is in the right atrium near the junction with the inferior vena cava. No pneumothorax. There are pleural effusions bilaterally. There is atelectatic change in both lower lobes. There is no frank consolidation. There is mild cardiomegaly with pulmonary vascularity within normal limits. No adenopathy. No bone lesions. IMPRESSION: Central catheter tip at junction of right atrium and inferior vena cava. No pneumothorax. Stable pleural effusions bilaterally with bibasilar atelectatic change. No frank consolidation. Stable cardiac prominence. Electronically Signed   By: Lowella Grip III M.D.   On: 05/26/2019 07:10   Dg Chest Port 1 View  Result Date: 05/25/2019 CLINICAL DATA:  Post thoracentesis EXAM: PORTABLE CHEST 1 VIEW COMPARISON:  05/25/2019, 12/21/2017 FINDINGS: Right-sided jugular venous catheter tip over the right atrial IVC junction. Decreased right pleural effusion with small residual. No pneumothorax. Moderate left pleural effusion remains. Left greater than right basilar airspace disease. Stable cardiomediastinal silhouette IMPRESSION: 1. Decreased right pleural effusion with small residual. No pneumothorax 2. Moderate left pleural effusion with left greater than right basilar airspace disease Electronically Signed   By: Donavan Foil M.D.   On: 05/25/2019 16:36   Dg Chest Port 1 View  Result Date: 05/25/2019 CLINICAL DATA:  Short of breath.  Dialysis patient EXAM: PORTABLE CHEST 1 VIEW COMPARISON:  None. FINDINGS: Large-bore central venous line with tip in the RIGHT atrium. Cardiac silhouette grossly normal. Resuscitation pads overlie the mediastinum. There is a large RIGHT  pleural effusion which is new from prior. Central venous congestion noted. potential RIGHT lower  lobe consolidation. IMPRESSION: 1. New large RIGHT pleural effusion. 2. Central venous congestion. 3. Potential RIGHT lower lobe consolidation. 4. No pneumothorax. Electronically Signed   By: Suzy Bouchard M.D.   On: 05/25/2019 13:03    Lab Results:  CBC    Component Value Date/Time   WBC 4.4 06/01/2019 0302   RBC 4.18 (L) 06/01/2019 0302   HGB 12.7 (L) 06/01/2019 0302   HCT 38.0 (L) 06/01/2019 0302   PLT 161 06/01/2019 0302   MCV 90.9 06/01/2019 0302   MCH 30.4 06/01/2019 0302   MCHC 33.4 06/01/2019 0302   RDW 17.2 (H) 06/01/2019 0302   LYMPHSABS 0.5 (L) 06/01/2019 0302   MONOABS 0.8 06/01/2019 0302   EOSABS 0.2 06/01/2019 0302   BASOSABS 0.0 06/01/2019 0302    BMET    Component Value Date/Time   NA 135 06/01/2019 0302   K 3.5 06/01/2019 0302   CL 101 06/01/2019 0302   CO2 26 06/01/2019 0302   GLUCOSE 166 (H) 06/01/2019 0302   BUN 15 06/01/2019 0302   CREATININE 2.48 (H) 06/01/2019 0302   CALCIUM 8.7 (L) 06/01/2019 0302   CALCIUM 7.7 (L) 01/28/2010 1225   GFRNONAA 28 (L) 06/01/2019 0302   GFRAA 32 (L) 06/01/2019 0302    BNP No results found for: BNP  ProBNP No results found for: PROBNP  Specialty Problems      Pulmonary Problems   Acute hypercapnic respiratory failure (HCC)   Acute respiratory failure with hypercapnia (HCC)   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  Underlying consolidation or mass not excluded.  Multiple enlarged mediastinal nodes.  05/25/2019-chest x-ray-new large right pleural effusion, potential right lower lobe consolidation, no pneumothorax, central venous congestion  05/25/2019-right thoracentesis-1500 cc Triglycerides-176 Protein-4.4 pH-7.8 LDH-80 Glucose-181 Cholesterol-80 Body solid fluid count-color yellow, appearance cloudy, 37% lymphs, 57% monocytes macrophages, Cytology-no malignant cells  identified      Shortness of breath      No Known Allergies   There is no immunization history on file for this patient.  Past Medical History:  Diagnosis Date  . Anemia   . Arthritis    patient denies  . Blind right eye   . Chronic kidney disease    Dialysis since 2011 MWF  . Complication of anesthesia   . Diabetes mellitus    type II  . Hypertension   . Legally blind   . No pertinent past medical history   . PONV (postoperative nausea and vomiting)    N/V- became dehydrated had to have IV fluids- 01/2015  . Shortness of breath dyspnea    when has too fluid  . Stroke [facility]) 2012   date per patient  . Syncope   . Unspecified cerebral artery occlusion with cerebral infarction 04/02/2013    Tobacco History: Social History   Tobacco Use  Smoking Status Never Smoker  Smokeless Tobacco Never Used   Counseling given: Yes   Continue to not smoke  Outpatient Encounter Medications as of 06/15/2019  Medication Sig  . aspirin EC 325 MG tablet Take 325 mg by mouth at bedtime.  Marland Kitchen atorvastatin (LIPITOR) 40 MG tablet Take 1 tablet (40 mg total) by mouth daily.  Marland Kitchen b complex vitamins tablet Take 1 tablet by mouth at bedtime.  . brimonidine (ALPHAGAN) 0.2 % ophthalmic solution Place 1 drop into both eyes 2 (two) times daily.   . calcitRIOL (ROCALTROL) 0.5 MCG capsule Take 1 capsule (0.5 mcg total) by mouth  3 (three) times a week.  . diphenhydramine-acetaminophen (TYLENOL PM) 25-500 MG TABS tablet Take 1 tablet by mouth at bedtime as needed (pain).  . dorzolamide-timolol (COSOPT) 22.3-6.8 MG/ML ophthalmic solution Place 1 drop into both eyes 2 (two) times daily.   Marland Kitchen lanthanum (FOSRENOL) 1000 MG chewable tablet Chew 1,000 mg by mouth 3 (three) times daily with meals.  . latanoprost (XALATAN) 0.005 % ophthalmic solution Place 1 drop into both eyes at bedtime.   . metoprolol tartrate (LOPRESSOR) 25 MG tablet Take 25 mg by mouth 2 (two) times daily.  . [DISCONTINUED] erythromycin  ophthalmic ointment Place 1 application into the left eye at bedtime as needed (dry eyes/itching).    No facility-administered encounter medications on file as of 06/15/2019.      Review of Systems  Review of Systems  Constitutional: Positive for fatigue. Negative for activity change, chills, fever and unexpected weight change.  HENT: Negative for congestion, postnasal drip, rhinorrhea, sinus pressure, sinus pain and sore throat.   Eyes:       Blind  Respiratory: Positive for shortness of breath (Improved). Negative for cough and wheezing.   Cardiovascular: Negative for chest pain, palpitations and leg swelling.  Gastrointestinal: Negative for constipation, diarrhea, nausea and vomiting.  Endocrine: Negative.   Genitourinary: Negative.   Musculoskeletal: Negative.   Skin: Negative.   Neurological: Negative for dizziness and headaches.  Psychiatric/Behavioral: Negative.  Negative for dysphoric mood. The patient is not nervous/anxious.   All other systems reviewed and are negative.    Physical Exam  BP 134/72 (BP Location: Left Arm, Patient Position: Sitting, Cuff Size: Normal)   Pulse 84   Temp (!) 97.4 F (36.3 C) (Temporal)   Ht 6' (1.829 m)   Wt 162 lb (73.5 kg)   SpO2 99% Comment: ra  BMI 21.97 kg/m   Wt Readings from Last 5 Encounters:  06/15/19 162 lb (73.5 kg)  06/01/19 164 lb 3.9 oz (74.5 kg)  04/29/18 211 lb (95.7 kg)  01/22/18 214 lb (97.1 kg)  01/16/18 214 lb (97.1 kg)    BMI Readings from Last 5 Encounters:  06/15/19 21.97 kg/m  06/01/19 22.28 kg/m  04/29/18 30.28 kg/m  01/22/18 30.71 kg/m  01/16/18 30.71 kg/m     Physical Exam Vitals signs and nursing note reviewed.  Constitutional:      General: He is not in acute distress.    Comments: Thin frail adult male  HENT:     Head: Normocephalic and atraumatic.     Right Ear: Hearing, tympanic membrane, ear canal and external ear normal.     Left Ear: Hearing, tympanic membrane, ear canal and  external ear normal.     Nose: Nose normal. No mucosal edema or rhinorrhea.     Right Turbinates: Not enlarged.     Left Turbinates: Not enlarged.     Mouth/Throat:     Mouth: Mucous membranes are dry.     Dentition: Abnormal dentition. Dental caries present.     Pharynx: Oropharynx is clear. No oropharyngeal exudate.  Neck:     Musculoskeletal: Normal range of motion.  Cardiovascular:     Rate and Rhythm: Normal rate and regular rhythm.     Pulses: Normal pulses.     Heart sounds: Normal heart sounds. No murmur.  Pulmonary:     Effort: Pulmonary effort is normal.     Breath sounds: Examination of the right-lower field reveals decreased breath sounds. Examination of the left-lower field reveals decreased breath sounds. Decreased breath sounds (  Left greater than right bases) present. No wheezing or rales.  Musculoskeletal:     Right lower leg: No edema.     Left lower leg: No edema.  Lymphadenopathy:     Cervical: No cervical adenopathy.  Skin:    General: Skin is warm and dry.     Capillary Refill: Capillary refill takes less than 2 seconds.     Findings: No erythema or rash.  Neurological:     General: No focal deficit present.     Mental Status: He is alert and oriented to person, place, and time.     Motor: No weakness.     Coordination: Coordination normal.     Gait: Gait is intact. Gait normal.  Psychiatric:        Mood and Affect: Mood normal.        Behavior: Behavior normal. Behavior is cooperative.        Thought Content: Thought content normal.        Judgment: Judgment normal.      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 Valeta Harms on 06/25/2019 Chest x-ray today Repeat CT of chest in January/2021  Abnormal findings on diagnostic imaging of lung Plan: Repeat CT chest in January/2021  S/P thoracentesis Doing well Vitals stable at  appointment today  Plan: We will repeat chest x-ray    Return in about 10 days (around 06/25/2019), or if symptoms worsen or fail to improve, for Follow up with Dr. Valeta Harms.   Lauraine Rinne, NP 06/15/2019   This appointment was 30 minutes long with over 50% of the time in direct face-to-face patient care, assessment, plan of care, and follow-up.

## 2019-06-15 NOTE — Assessment & Plan Note (Signed)
Plan:  Keep appts with MWF HD

## 2019-06-15 NOTE — Progress Notes (Signed)
PCCM: thanks. Garner Nash, DO [facility] 06/15/2019 3:30 PM

## 2019-06-15 NOTE — Assessment & Plan Note (Signed)
Plan: Repeat CT chest in January/2021

## 2019-06-15 NOTE — Assessment & Plan Note (Signed)
Plan: Keep upcoming appointment with Dr. Valeta Harms on 06/25/2019 Chest x-ray today Repeat CT of chest in January/2021

## 2019-06-15 NOTE — Patient Instructions (Addendum)
You were seen today by Lauraine Rinne, NP  for:   1. 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 Valeta Harms and 30-minute time slot on 06/25/2019  2. Shortness of breath  - DG Chest 2 View; Future  3. ESRD (end stage renal disease) on dialysis [facility])  Continue Monday Wednesday Friday hemodialysis  4. S/P thoracentesis  5. Abnormal findings on diagnostic imaging of lung  - CT Chest Wo Contrast; Future  6. Pleural effusion, not elsewhere classified  - CT Chest Wo Contrast; Future  7. Care Coordination   Please contact:  Jacqlyn Larsen [facility], BSN [place] Coordinator [phone]  We recommend today:  Orders Placed This Encounter  Procedures  . DG Chest 2 View    Standing Status:   Future    Standing Expiration Date:   08/15/2020    Order Specific Question:   Reason for Exam (SYMPTOM  OR DIAGNOSIS REQUIRED)    Answer:   shortness of breath    Order Specific Question:   Preferred imaging location?    Answer:   Internal    Order Specific Question:   Radiology Contrast Protocol - do NOT remove file path    Answer:   \\[email or web address]  . CT Chest Wo Contrast    Standing Status:   Future    Standing Expiration Date:   08/15/2020    Scheduling Instructions:     Schedule around 08/03/2019    Order Specific Question:   Preferred imaging location?    Answer:   [place]    Order Specific Question:   Radiology Contrast Protocol - do NOT remove file path    Answer:   \\[email or web address]   Orders Placed This Encounter  Procedures  . DG Chest 2 View  . CT Chest Wo Contrast   No orders of the defined types were placed in this encounter.   Follow Up:    Return in about 10 days (around 06/25/2019), or if symptoms worsen or fail to improve, for Follow up with Dr. Valeta Harms.   Please do your part to reduce the spread of  COVID-19:      Reduce your risk of any infection  and COVID19 by using the similar precautions used for avoiding the common cold or flu:  Marland Kitchen Wash your hands often with soap and warm water for at least 20 seconds.  If soap and water are not readily available, use an alcohol-based hand sanitizer with at least 60% alcohol.  . If coughing or sneezing, cover your mouth and nose by coughing or sneezing into the elbow areas of your shirt or coat, into a tissue or into your sleeve (not your hands). Langley Gauss A MASK when in public  . Avoid shaking hands with others and consider head nods or verbal greetings only. . Avoid touching your eyes, nose, or mouth with unwashed hands.  . Avoid close contact with people who are sick. . Avoid places or events with large numbers of people in one location, like concerts or sporting events. . If you have some symptoms but not all symptoms, continue to monitor at home and seek medical attention if your symptoms worsen. . If you are having a medical emergency, call 911.   [place] / e-Visit: [email or web address]         [facility]: [place]  [facility]: [facility]: [phone]     It is flu season:   >>> Best ways to protect herself from the flu: Receive the yearly flu vaccine, practice good hand hygiene washing with soap and also using hand sanitizer when available, eat a nutritious meals, get adequate rest, hydrate appropriately   Please contact the office if your symptoms worsen or you have concerns that you are not improving.   Thank you for choosing [facility] for your healthcare, and for allowing [place] to partner with you on your healthcare journey. I am thankful to be able to provide care to you today.   Wyn Quaker FNP-C

## 2019-06-16 ENCOUNTER — Telehealth: Payer: Self-pay | Admitting: Pulmonary Disease

## 2019-06-16 ENCOUNTER — Other Ambulatory Visit: Payer: Self-pay | Admitting: *Deleted

## 2019-06-16 ENCOUNTER — Telehealth (INDEPENDENT_AMBULATORY_CARE_PROVIDER_SITE_OTHER): Payer: Medicare Other | Admitting: Adult Health

## 2019-06-16 ENCOUNTER — Encounter: Payer: Self-pay | Admitting: Adult Health

## 2019-06-16 VITALS — BP 132/57 | HR 98 | Ht 72.0 in | Wt 160.0 lb

## 2019-06-16 DIAGNOSIS — Z992 Dependence on renal dialysis: Secondary | ICD-10-CM | POA: Diagnosis not present

## 2019-06-16 DIAGNOSIS — E7800 Pure hypercholesterolemia, unspecified: Secondary | ICD-10-CM

## 2019-06-16 DIAGNOSIS — R55 Syncope and collapse: Secondary | ICD-10-CM

## 2019-06-16 DIAGNOSIS — N186 End stage renal disease: Secondary | ICD-10-CM | POA: Diagnosis not present

## 2019-06-16 NOTE — Progress Notes (Signed)
Decreased bilateral pleural effusions as we suspected.  This is improved from chest x-ray at [facility] on 05/29/2019.  We will need to continue to monitor your symptoms.  If you start having increased shortness of breath, fatigue, trouble breathing while lying flat, rib pain please contact your office and let us know.  Keep upcoming appointment with Dr. Valeta Harms on 06/25/2019.  Wyn Quaker, FNP

## 2019-06-16 NOTE — Patient Instructions (Signed)
Medication Instructions:  Continue current medications  *If you need a refill on your cardiac medications before your next appointment, please call your pharmacy*  Lab Work: None Ordered  Testing/Procedures: None ordered  Follow-Up: At [facility], you and your health needs are our priority.  As part of our continuing mission to provide you with exceptional heart care, we have created designated Provider Care Teams.  These Care Teams include your primary Cardiologist (physician) and Advanced Practice Providers (APPs -  Physician Assistants and Nurse Practitioners) who all work together to provide you with the care you need, when you need it.  Your next appointment:   6 month(s)  The format for your next appointment:   Virtual Visit   Provider:   Jory Sims, DNP, ANP

## 2019-06-16 NOTE — Telephone Encounter (Signed)
Spoke to pt, advised him to call the number listed below. Pt understood and nothing further is needed.

## 2019-06-16 NOTE — Telephone Encounter (Signed)
Called and spoke with patient in regards to his CXR results.   While on the phone, he stated that he had received a call stating that he was scheduled for CT on 08/03/19 at 10am at [facility]. He stated that this day was ok but he goes to dialysis on Monday mornings. He wanted to know if he could have the CT moved until after 1pm so he will be able to finish dialysis in time. I advised him that I would check with the PCCs.   PCCs, please advise if this appt can be changed. I can place a new order if needed. Thanks!

## 2019-06-16 NOTE — Telephone Encounter (Signed)
Yes.  Pt just needs to call [phone] & they will r/s his appointment however he may need it.

## 2019-06-16 NOTE — Patient Outreach (Signed)
Third outreach attempt to address Red EMMI general discharge flag for 06/08/19 Day #4, Sad, hopeless, empty, anxious- yes, Other questions/ problems- yes.  RN CM spoke with pt and addressed, pt reports " I was having a little depression but I'm much better now, things are going better"  Pt reports he has also discussed depression and any other issues with primary MD, pt states he has no concerns/ issues.    Jacqlyn Larsen [facility], [place] Coordinator [phone]

## 2019-06-24 DIAGNOSIS — I5022 Chronic systolic (congestive) heart failure: Secondary | ICD-10-CM | POA: Insufficient documentation

## 2019-06-24 NOTE — Progress Notes (Signed)
Synopsis: Referred in Dec 2020 for Chylothorax by Vincente Liberty, MD  Subjective:   PATIENT ID: Shawn [name] GENDER: male DOB: 01/30/1961, MRN: [id number]  Chief Complaint  Patient presents with  . Pulmonary Consult    This is a 58 year old past medical history CVA, type 2 diabetes, hypertension, end-stage renal disease on intermittent dialysis Monday Wednesday Friday was admitted to the hospital in November 2020.  Following a syncopal episode.  During this work-up he had a right-sided pleural effusion that was milky on analysis found to have a triglyceride level of 176 and lymphocyte predominant consistent with a chylothorax.  Patient was seen in hospital follow-up by Wyn Quaker in December 2020.  Here today to establish care.  CT chest also revealed multiple enlarged mediastinal nodes.  Patient's right-sided thoracentesis of approximately 1500 cc, triglycerides 176, protein 4.4, pH 7.8, LDH 80, glucose 181, cholesterol 80, cell count 37% lymphocytes, cytology no evidence of malignant cells.  Patient was referred to me for evaluation of mediastinal adenopathy with an associated chylothorax.  OV 06/25/2019: Patient doing well today.  States that he is breathing much better after having regular sessions of dialysis.  Currently on dialysis Monday Wednesday Friday.  He has been cognizant of his dry weight.  Patient denies fevers chills night sweats weight loss.  He has no hemoptysis.    Past Medical History:  Diagnosis Date  . Anemia   . Arthritis    patient denies  . Blind right eye   . Chronic kidney disease    Dialysis since 2011 MWF  . Complication of anesthesia   . Diabetes mellitus    type II  . Hypertension   . Legally blind   . No pertinent past medical history   . PONV (postoperative nausea and vomiting)    N/V- became dehydrated had to have IV fluids- 01/2015  . Shortness of breath dyspnea    when has too fluid  . Stroke [facility]) 2012   date per patient  . Syncope    . Unspecified cerebral artery occlusion with cerebral infarction 04/02/2013     Family History  Problem Relation Age of Onset  . Diabetes Mother   . Alzheimer's disease Mother   . Cancer Father   . Heart disease Father   . Cancer Sister   . Stroke Sister      Past Surgical History:  Procedure Laterality Date  . AV FISTULA PLACEMENT Right 05/12/2013   Procedure: INSERTION OF ARTERIOVENOUS (AV) GORE-TEX GRAFT ARM; ULTRASOUND GUIDED;  Surgeon: Conrad [place], MD;  Location: [place];  Service: Vascular;  Laterality: Right;  . AV FISTULA PLACEMENT Left 01/18/2015   Procedure: INSERTION OF ARTERIOVENOUS GORE-TEX GRAFT LEFT UPPER ARM;  Surgeon: Conrad [place], MD;  Location: [place];  Service: Vascular;  Laterality: Left;  . COLONOSCOPY  06/2012  . EYE SURGERY Bilateral    retina surgery both eyes, cataract surgery both eyes  . HERNIA REPAIR     right inguinal  . INSERTION OF DIALYSIS CATHETER Right   . left arm graft  10/2010  . LIGATION ARTERIOVENOUS GORTEX GRAFT Left 05/03/2015   Procedure: LIGATION ARTERIOVENOUS GORTEX GRAFT-LEFT UPPER ARM;  Surgeon: Conrad , MD;  Location: [place];  Service: Vascular;  Laterality: Left;    Social History   Socioeconomic History  . Marital status: Married    Spouse name: Not on file  . Number of children: 0  . Years of education: 12th  . Highest education level: Not on  file  Occupational History    Employer: UNEMPLOYED  Tobacco Use  . Smoking status: Never Smoker  . Smokeless tobacco: Never Used  Substance and Sexual Activity  . Alcohol use: No    Alcohol/week: 0.0 standard drinks  . Drug use: No  . Sexual activity: Not on file  Other Topics Concern  . Not on file  Social History Narrative   Patient lives at home with wife.    Patient is disabled.    Patient has no children.    Patient has a 12 grade education.       Social Determinants of Health   Financial Resource Strain:   . Difficulty of Paying Living Expenses: Not on file   Food Insecurity:   . Worried About Charity fundraiser in the Last Year: Not on file  . Ran Out of Food in the Last Year: Not on file  Transportation Needs:   . Lack of Transportation (Medical): Not on file  . Lack of Transportation (Non-Medical): Not on file  Physical Activity:   . Days of Exercise per Week: Not on file  . Minutes of Exercise per Session: Not on file  Stress:   . Feeling of Stress : Not on file  Social Connections:   . Frequency of Communication with Friends and Family: Not on file  . Frequency of Social Gatherings with Friends and Family: Not on file  . Attends Religious Services: Not on file  . Active Member of Clubs or Organizations: Not on file  . Attends Archivist Meetings: Not on file  . Marital Status: Not on file  Intimate Partner Violence:   . Fear of Current or Ex-Partner: Not on file  . Emotionally Abused: Not on file  . Physically Abused: Not on file  . Sexually Abused: Not on file     No Known Allergies   Outpatient Medications Prior to Visit  Medication Sig Dispense Refill  . aspirin EC 325 MG tablet Take 325 mg by mouth at bedtime.    Marland Kitchen b complex vitamins tablet Take 1 tablet by mouth at bedtime.    . brimonidine (ALPHAGAN) 0.2 % ophthalmic solution Place 1 drop into both eyes 2 (two) times daily.     . calcitRIOL (ROCALTROL) 0.5 MCG capsule Take 1 capsule (0.5 mcg total) by mouth 3 (three) times a week. 30 capsule 0  . diphenhydramine-acetaminophen (TYLENOL PM) 25-500 MG TABS tablet Take 1 tablet by mouth at bedtime as needed (pain).    . dorzolamide-timolol (COSOPT) 22.3-6.8 MG/ML ophthalmic solution Place 1 drop into both eyes 2 (two) times daily.     Marland Kitchen lanthanum (FOSRENOL) 1000 MG chewable tablet Chew 1,000 mg by mouth 3 (three) times daily with meals.    . latanoprost (XALATAN) 0.005 % ophthalmic solution Place 1 drop into both eyes at bedtime.     . metoprolol tartrate (LOPRESSOR) 25 MG tablet Take 25 mg by mouth 2 (two) times  daily.  11  . atorvastatin (LIPITOR) 40 MG tablet Take 1 tablet (40 mg total) by mouth daily. 90 tablet 3   No facility-administered medications prior to visit.    Review of Systems  Constitutional: Negative for chills, fever, malaise/fatigue and weight loss.  HENT: Negative for hearing loss, sore throat and tinnitus.   Eyes: Negative for blurred vision and double vision.  Respiratory: Positive for shortness of breath. Negative for cough, hemoptysis, sputum production, wheezing and stridor.   Cardiovascular: Negative for chest pain, palpitations, orthopnea, leg swelling  and PND.  Gastrointestinal: Negative for abdominal pain, constipation, diarrhea, heartburn, nausea and vomiting.  Genitourinary: Negative for dysuria, hematuria and urgency.  Musculoskeletal: Negative for joint pain and myalgias.  Skin: Negative for itching and rash.  Neurological: Negative for dizziness, tingling, weakness and headaches.  Endo/Heme/Allergies: Negative for environmental allergies. Does not bruise/bleed easily.  Psychiatric/Behavioral: Negative for depression. The patient is not nervous/anxious and does not have insomnia.   All other systems reviewed and are negative.    Objective:  Physical Exam Vitals reviewed.  Constitutional:      General: He is not in acute distress.    Appearance: He is well-developed.     Comments: Cachetic   HENT:     Head: Normocephalic and atraumatic.  Eyes:     General: No scleral icterus.    Conjunctiva/sclera: Conjunctivae normal.     Pupils: Pupils are equal, round, and reactive to light.  Neck:     Vascular: No JVD.     Trachea: No tracheal deviation.  Cardiovascular:     Rate and Rhythm: Normal rate and regular rhythm.     Heart sounds: Murmur present.     Comments: Systolic murmur  Pulmonary:     Effort: Pulmonary effort is normal. No tachypnea, accessory muscle usage or respiratory distress.     Breath sounds: No stridor. No wheezing, rhonchi or rales.      Comments: Absent in the bases bilaterally  Abdominal:     General: Bowel sounds are normal. There is no distension.     Palpations: Abdomen is soft.     Tenderness: There is no abdominal tenderness.  Musculoskeletal:        General: No tenderness.     Cervical back: Neck supple.     Right lower leg: No edema.     Left lower leg: No edema.  Lymphadenopathy:     Cervical: No cervical adenopathy.  Skin:    General: Skin is warm and dry.     Capillary Refill: Capillary refill takes less than 2 seconds.     Findings: No rash.  Neurological:     Mental Status: He is alert and oriented to person, place, and time.  Psychiatric:        Behavior: Behavior normal.      Vitals:   06/25/19 0946  BP: 124/64  Pulse: 86  Temp: 98 F (36.7 C)  TempSrc: Oral  SpO2: 98%  Weight: 163 lb (73.9 kg)  Height: 6\' 2"  (1.88 m)   98% on RA BMI Readings from Last 3 Encounters:  06/25/19 20.93 kg/m  06/16/19 21.70 kg/m  06/15/19 21.97 kg/m   Wt Readings from Last 3 Encounters:  06/25/19 163 lb (73.9 kg)  06/16/19 160 lb (72.6 kg)  06/15/19 162 lb (73.5 kg)     CBC    Component Value Date/Time   WBC 4.4 06/01/2019 0302   RBC 4.18 (L) 06/01/2019 0302   HGB 12.7 (L) 06/01/2019 0302   HCT 38.0 (L) 06/01/2019 0302   PLT 161 06/01/2019 0302   MCV 90.9 06/01/2019 0302   MCH 30.4 06/01/2019 0302   MCHC 33.4 06/01/2019 0302   RDW 17.2 (H) 06/01/2019 0302   LYMPHSABS 0.5 (L) 06/01/2019 0302   MONOABS 0.8 06/01/2019 0302   EOSABS 0.2 06/01/2019 0302   BASOSABS 0.0 06/01/2019 0302    Results for MAXXWELL, BE (MRN [id number]) as of 06/24/2019 10:16  Ref. Range 05/25/2019 16:53  Cholesterol, Fluid Latest Units: mg/dL 85  Chol, Fluid Type Unknown  PLEURAL  Glucose, Fluid Latest Units: mg/dL 181  Fluid Type-FGLU Unknown PLEURAL  Fluid Type-FLDH Unknown PLEURAL  LD, Fluid Latest Ref Range: 3 - 23 U/L 80 (H)  Total protein, fluid Latest Units: g/dL 4.4  Triglycerides, Fluid  Latest Ref Range: Not Estab. mg/dL 176  pH, Body Fluid Latest Ref Range: Not Estab.  7.8  Fluid Type-FCT Unknown PLEURAL  Fluid Type-FTP Unknown PLEURAL  Fluid Type-FTRIG Unknown PLEURAL  Color, Fluid Unknown YELLOW  Total Nucleated Cell Count, Fluid Latest Ref Range: 0 - 1,000 cu mm 64  Lymphs, Fluid Latest Units: % 37  Eos, Fluid Latest Units: % 0  Appearance, Fluid Latest Ref Range: CLEAR  CLOUDY (A)  Neutrophil Count, Fluid Latest Ref Range: 0 - 25 % 6  Monocyte-Macrophage-Serous Fluid Latest Ref Range: 50 - 90 % 57    Chest Imaging:  Chest x-ray 06/15/2019: Decrease in bilateral pleural effusion. The patient's images have been independently reviewed by me.    Pulmonary Functions Testing Results: No flowsheet data found.  FeNO: none   Pathology: none   Echocardiogram:   - Left ventricle: The cavity size was normal. There was mild   concentric hypertrophy. Systolic function was mildly reduced. The   estimated ejection fraction was in the range of 45% to 50%. Mild   diffuse hypokinesis. Doppler parameters are consistent with   abnormal left ventricular relaxation (grade 1 diastolic   dysfunction). Doppler parameters are consistent with high   ventricular filling pressure. - Aortic valve: Transvalvular velocity was within the normal range.   There was no stenosis. There was no regurgitation. Valve area   (VTI): 2.17 cm^2. Valve area (Vmax): 2.16 cm^2. Valve area   (Vmean): 2.14 cm^2. - Mitral valve: Transvalvular velocity was within the normal range.   There was no evidence for stenosis. There was trivial   regurgitation. - Left atrium: The atrium was mildly dilated. - Right ventricle: The cavity size was normal. Wall thickness was   normal. Systolic function was normal. - Tricuspid valve: There was trivial regurgitation. - Pulmonary arteries: Systolic pressure was within the normal   range. PA peak pressure: 35 mm Hg (S).  Myo perfusion 01/2018  There was no ST  segment deviation noted during stress.  The left ventricular ejection fraction is mildly decreased (45-54%).  Nuclear stress EF: 51%, there is apicalseptal hypokinesis.  This is a low risk study. With no significant ischemia or perfusion defects identified, however ejection fraction is mildly reduced with apical septal hypokinesis.  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 ESRD (end stage renal disease) on dialysis ([place])  N18.6    Z99.2   3. Chronic systolic heart failure (HCC)  I50.22   4. Breast lump  N63.0   5. Skin lesion  L98.9     Discussion:  This is a 58 year old gentleman follow-up in our clinic for a right-sided chylothorax after being tapped during a hospital admission.  Triglycerides at 176.  He has no other significant abnormality except some mediastinal adenopathy on CT imaging that was completed during his hospitalization.  The right-sided chylothorax could very well be from his heart failure as well as end-stage renal disease.  This is not uncommon to find in the setting of chronic effusions related to the states with various changes in fluid status.  Recent chest x-ray last week with improvement of effusions.  Patient currently has no warning  symptoms.  Plan: Repeat CT scan of the chest in January Follow-up in our clinic following this. If adenopathy within the chest still present we should consider video bronchoscopy with endobronchial ultrasound for transneedle aspirations and biopsy to rule out malignancy. Continue regular dialysis sessions  Follow-up in clinic 1st week February following his CT scan with either myself or Dr. Tamala Julian.  Greater than 50% of this patient's 25-minute office visit was spent face-to-face discussing above recommendations and treatment plan.   Current Outpatient Medications:  .  aspirin EC 325 MG tablet, Take 325 mg by mouth at bedtime., Disp: , Rfl:  .  b complex vitamins  tablet, Take 1 tablet by mouth at bedtime., Disp: , Rfl:  .  brimonidine (ALPHAGAN) 0.2 % ophthalmic solution, Place 1 drop into both eyes 2 (two) times daily. , Disp: , Rfl:  .  calcitRIOL (ROCALTROL) 0.5 MCG capsule, Take 1 capsule (0.5 mcg total) by mouth 3 (three) times a week., Disp: 30 capsule, Rfl: 0 .  diphenhydramine-acetaminophen (TYLENOL PM) 25-500 MG TABS tablet, Take 1 tablet by mouth at bedtime as needed (pain)., Disp: , Rfl:  .  dorzolamide-timolol (COSOPT) 22.3-6.8 MG/ML ophthalmic solution, Place 1 drop into both eyes 2 (two) times daily. , Disp: , Rfl:  .  lanthanum (FOSRENOL) 1000 MG chewable tablet, Chew 1,000 mg by mouth 3 (three) times daily with meals., Disp: , Rfl:  .  latanoprost (XALATAN) 0.005 % ophthalmic solution, Place 1 drop into both eyes at bedtime. , Disp: , Rfl:  .  metoprolol tartrate (LOPRESSOR) 25 MG tablet, Take 25 mg by mouth 2 (two) times daily., Disp: , Rfl: 11 .  atorvastatin (LIPITOR) 40 MG tablet, Take 1 tablet (40 mg total) by mouth daily., Disp: 90 tablet, Rfl: 3   Garner Nash, DO [facility] 06/25/2019 9:56 AM

## 2019-06-24 NOTE — Patient Instructions (Addendum)
Thank you for visiting Dr. Valeta Harms at [facility]. Today we recommend the following:  CT Scan at end of January Follow up with me to discuss lymphadenopathy and effusions Continue to regularly manage your dialysis   Return in about 8 weeks (around 08/20/2019) for with me or Dr. Tamala Julian .    Please do your part to reduce the spread of COVID-19.

## 2019-06-25 ENCOUNTER — Ambulatory Visit (INDEPENDENT_AMBULATORY_CARE_PROVIDER_SITE_OTHER): Payer: Medicare Other | Admitting: Pulmonary Disease

## 2019-06-25 ENCOUNTER — Encounter: Payer: Self-pay | Admitting: Pulmonary Disease

## 2019-06-25 ENCOUNTER — Other Ambulatory Visit: Payer: Self-pay

## 2019-06-25 VITALS — BP 124/64 | HR 86 | Temp 98.0°F | Ht 74.0 in | Wt 163.0 lb

## 2019-06-25 DIAGNOSIS — I898 Other specified noninfective disorders of lymphatic vessels and lymph nodes: Secondary | ICD-10-CM

## 2019-06-25 DIAGNOSIS — Z992 Dependence on renal dialysis: Secondary | ICD-10-CM

## 2019-06-25 DIAGNOSIS — I5022 Chronic systolic (congestive) heart failure: Secondary | ICD-10-CM

## 2019-06-25 DIAGNOSIS — N63 Unspecified lump in unspecified breast: Secondary | ICD-10-CM

## 2019-06-25 DIAGNOSIS — J940 Chylous effusion: Secondary | ICD-10-CM

## 2019-06-25 DIAGNOSIS — L989 Disorder of the skin and subcutaneous tissue, unspecified: Secondary | ICD-10-CM | POA: Insufficient documentation

## 2019-06-25 DIAGNOSIS — N186 End stage renal disease: Secondary | ICD-10-CM

## 2019-07-10 DIAGNOSIS — Z992 Dependence on renal dialysis: Secondary | ICD-10-CM | POA: Diagnosis not present

## 2019-07-10 DIAGNOSIS — N186 End stage renal disease: Secondary | ICD-10-CM | POA: Diagnosis not present

## 2019-07-10 DIAGNOSIS — I120 Hypertensive chronic kidney disease with stage 5 chronic kidney disease or end stage renal disease: Secondary | ICD-10-CM | POA: Diagnosis not present

## 2019-07-11 DIAGNOSIS — D509 Iron deficiency anemia, unspecified: Secondary | ICD-10-CM | POA: Diagnosis not present

## 2019-07-11 DIAGNOSIS — N186 End stage renal disease: Secondary | ICD-10-CM | POA: Diagnosis not present

## 2019-07-11 DIAGNOSIS — E119 Type 2 diabetes mellitus without complications: Secondary | ICD-10-CM | POA: Diagnosis not present

## 2019-07-11 DIAGNOSIS — E876 Hypokalemia: Secondary | ICD-10-CM | POA: Diagnosis not present

## 2019-07-11 DIAGNOSIS — Z992 Dependence on renal dialysis: Secondary | ICD-10-CM | POA: Diagnosis not present

## 2019-07-11 DIAGNOSIS — D631 Anemia in chronic kidney disease: Secondary | ICD-10-CM | POA: Diagnosis not present

## 2019-07-11 DIAGNOSIS — N2581 Secondary hyperparathyroidism of renal origin: Secondary | ICD-10-CM | POA: Diagnosis not present

## 2019-07-13 DIAGNOSIS — N186 End stage renal disease: Secondary | ICD-10-CM | POA: Diagnosis not present

## 2019-07-13 DIAGNOSIS — E119 Type 2 diabetes mellitus without complications: Secondary | ICD-10-CM | POA: Diagnosis not present

## 2019-07-13 DIAGNOSIS — D631 Anemia in chronic kidney disease: Secondary | ICD-10-CM | POA: Diagnosis not present

## 2019-07-13 DIAGNOSIS — N2581 Secondary hyperparathyroidism of renal origin: Secondary | ICD-10-CM | POA: Diagnosis not present

## 2019-07-13 DIAGNOSIS — Z992 Dependence on renal dialysis: Secondary | ICD-10-CM | POA: Diagnosis not present

## 2019-07-13 DIAGNOSIS — D509 Iron deficiency anemia, unspecified: Secondary | ICD-10-CM | POA: Diagnosis not present

## 2019-07-15 DIAGNOSIS — D631 Anemia in chronic kidney disease: Secondary | ICD-10-CM | POA: Diagnosis not present

## 2019-07-15 DIAGNOSIS — Z992 Dependence on renal dialysis: Secondary | ICD-10-CM | POA: Diagnosis not present

## 2019-07-15 DIAGNOSIS — N186 End stage renal disease: Secondary | ICD-10-CM | POA: Diagnosis not present

## 2019-07-15 DIAGNOSIS — E119 Type 2 diabetes mellitus without complications: Secondary | ICD-10-CM | POA: Diagnosis not present

## 2019-07-15 DIAGNOSIS — N2581 Secondary hyperparathyroidism of renal origin: Secondary | ICD-10-CM | POA: Diagnosis not present

## 2019-07-15 DIAGNOSIS — D509 Iron deficiency anemia, unspecified: Secondary | ICD-10-CM | POA: Diagnosis not present

## 2019-07-17 DIAGNOSIS — D509 Iron deficiency anemia, unspecified: Secondary | ICD-10-CM | POA: Diagnosis not present

## 2019-07-17 DIAGNOSIS — D631 Anemia in chronic kidney disease: Secondary | ICD-10-CM | POA: Diagnosis not present

## 2019-07-17 DIAGNOSIS — E119 Type 2 diabetes mellitus without complications: Secondary | ICD-10-CM | POA: Diagnosis not present

## 2019-07-17 DIAGNOSIS — N186 End stage renal disease: Secondary | ICD-10-CM | POA: Diagnosis not present

## 2019-07-17 DIAGNOSIS — Z992 Dependence on renal dialysis: Secondary | ICD-10-CM | POA: Diagnosis not present

## 2019-07-17 DIAGNOSIS — N2581 Secondary hyperparathyroidism of renal origin: Secondary | ICD-10-CM | POA: Diagnosis not present

## 2019-07-20 DIAGNOSIS — D631 Anemia in chronic kidney disease: Secondary | ICD-10-CM | POA: Diagnosis not present

## 2019-07-20 DIAGNOSIS — D509 Iron deficiency anemia, unspecified: Secondary | ICD-10-CM | POA: Diagnosis not present

## 2019-07-20 DIAGNOSIS — N2581 Secondary hyperparathyroidism of renal origin: Secondary | ICD-10-CM | POA: Diagnosis not present

## 2019-07-20 DIAGNOSIS — N186 End stage renal disease: Secondary | ICD-10-CM | POA: Diagnosis not present

## 2019-07-20 DIAGNOSIS — Z992 Dependence on renal dialysis: Secondary | ICD-10-CM | POA: Diagnosis not present

## 2019-07-20 DIAGNOSIS — E119 Type 2 diabetes mellitus without complications: Secondary | ICD-10-CM | POA: Diagnosis not present

## 2019-07-22 DIAGNOSIS — E119 Type 2 diabetes mellitus without complications: Secondary | ICD-10-CM | POA: Diagnosis not present

## 2019-07-22 DIAGNOSIS — N2581 Secondary hyperparathyroidism of renal origin: Secondary | ICD-10-CM | POA: Diagnosis not present

## 2019-07-22 DIAGNOSIS — D631 Anemia in chronic kidney disease: Secondary | ICD-10-CM | POA: Diagnosis not present

## 2019-07-22 DIAGNOSIS — D509 Iron deficiency anemia, unspecified: Secondary | ICD-10-CM | POA: Diagnosis not present

## 2019-07-22 DIAGNOSIS — Z992 Dependence on renal dialysis: Secondary | ICD-10-CM | POA: Diagnosis not present

## 2019-07-22 DIAGNOSIS — N186 End stage renal disease: Secondary | ICD-10-CM | POA: Diagnosis not present

## 2019-07-24 DIAGNOSIS — N2581 Secondary hyperparathyroidism of renal origin: Secondary | ICD-10-CM | POA: Diagnosis not present

## 2019-07-24 DIAGNOSIS — D631 Anemia in chronic kidney disease: Secondary | ICD-10-CM | POA: Diagnosis not present

## 2019-07-24 DIAGNOSIS — N186 End stage renal disease: Secondary | ICD-10-CM | POA: Diagnosis not present

## 2019-07-24 DIAGNOSIS — E119 Type 2 diabetes mellitus without complications: Secondary | ICD-10-CM | POA: Diagnosis not present

## 2019-07-24 DIAGNOSIS — D509 Iron deficiency anemia, unspecified: Secondary | ICD-10-CM | POA: Diagnosis not present

## 2019-07-24 DIAGNOSIS — Z992 Dependence on renal dialysis: Secondary | ICD-10-CM | POA: Diagnosis not present

## 2019-07-27 DIAGNOSIS — E119 Type 2 diabetes mellitus without complications: Secondary | ICD-10-CM | POA: Diagnosis not present

## 2019-07-27 DIAGNOSIS — N2581 Secondary hyperparathyroidism of renal origin: Secondary | ICD-10-CM | POA: Diagnosis not present

## 2019-07-27 DIAGNOSIS — D631 Anemia in chronic kidney disease: Secondary | ICD-10-CM | POA: Diagnosis not present

## 2019-07-27 DIAGNOSIS — D509 Iron deficiency anemia, unspecified: Secondary | ICD-10-CM | POA: Diagnosis not present

## 2019-07-27 DIAGNOSIS — N186 End stage renal disease: Secondary | ICD-10-CM | POA: Diagnosis not present

## 2019-07-27 DIAGNOSIS — Z992 Dependence on renal dialysis: Secondary | ICD-10-CM | POA: Diagnosis not present

## 2019-07-29 DIAGNOSIS — D631 Anemia in chronic kidney disease: Secondary | ICD-10-CM | POA: Diagnosis not present

## 2019-07-29 DIAGNOSIS — Z992 Dependence on renal dialysis: Secondary | ICD-10-CM | POA: Diagnosis not present

## 2019-07-29 DIAGNOSIS — N186 End stage renal disease: Secondary | ICD-10-CM | POA: Diagnosis not present

## 2019-07-29 DIAGNOSIS — E119 Type 2 diabetes mellitus without complications: Secondary | ICD-10-CM | POA: Diagnosis not present

## 2019-07-29 DIAGNOSIS — N2581 Secondary hyperparathyroidism of renal origin: Secondary | ICD-10-CM | POA: Diagnosis not present

## 2019-07-29 DIAGNOSIS — D509 Iron deficiency anemia, unspecified: Secondary | ICD-10-CM | POA: Diagnosis not present

## 2019-07-31 DIAGNOSIS — Z992 Dependence on renal dialysis: Secondary | ICD-10-CM | POA: Diagnosis not present

## 2019-07-31 DIAGNOSIS — D631 Anemia in chronic kidney disease: Secondary | ICD-10-CM | POA: Diagnosis not present

## 2019-07-31 DIAGNOSIS — N2581 Secondary hyperparathyroidism of renal origin: Secondary | ICD-10-CM | POA: Diagnosis not present

## 2019-07-31 DIAGNOSIS — N186 End stage renal disease: Secondary | ICD-10-CM | POA: Diagnosis not present

## 2019-07-31 DIAGNOSIS — D509 Iron deficiency anemia, unspecified: Secondary | ICD-10-CM | POA: Diagnosis not present

## 2019-07-31 DIAGNOSIS — E119 Type 2 diabetes mellitus without complications: Secondary | ICD-10-CM | POA: Diagnosis not present

## 2019-08-03 ENCOUNTER — Ambulatory Visit
Admission: RE | Admit: 2019-08-03 | Discharge: 2019-08-03 | Disposition: A | Discharge status: Home / Self Care | Payer: Medicare Other | Source: Ambulatory Visit | Attending: Pulmonary Disease | Admitting: Pulmonary Disease

## 2019-08-03 ENCOUNTER — Other Ambulatory Visit: Payer: Medicare Other

## 2019-08-03 DIAGNOSIS — J940 Chylous effusion: Secondary | ICD-10-CM

## 2019-08-03 DIAGNOSIS — N186 End stage renal disease: Secondary | ICD-10-CM | POA: Diagnosis not present

## 2019-08-03 DIAGNOSIS — J90 Pleural effusion, not elsewhere classified: Secondary | ICD-10-CM

## 2019-08-03 DIAGNOSIS — R918 Other nonspecific abnormal finding of lung field: Secondary | ICD-10-CM

## 2019-08-03 DIAGNOSIS — Z992 Dependence on renal dialysis: Secondary | ICD-10-CM | POA: Diagnosis not present

## 2019-08-03 DIAGNOSIS — I898 Other specified noninfective disorders of lymphatic vessels and lymph nodes: Secondary | ICD-10-CM

## 2019-08-03 DIAGNOSIS — N2581 Secondary hyperparathyroidism of renal origin: Secondary | ICD-10-CM | POA: Diagnosis not present

## 2019-08-03 DIAGNOSIS — D509 Iron deficiency anemia, unspecified: Secondary | ICD-10-CM | POA: Diagnosis not present

## 2019-08-03 DIAGNOSIS — E119 Type 2 diabetes mellitus without complications: Secondary | ICD-10-CM | POA: Diagnosis not present

## 2019-08-03 DIAGNOSIS — D631 Anemia in chronic kidney disease: Secondary | ICD-10-CM | POA: Diagnosis not present

## 2019-08-05 DIAGNOSIS — D509 Iron deficiency anemia, unspecified: Secondary | ICD-10-CM | POA: Diagnosis not present

## 2019-08-05 DIAGNOSIS — N186 End stage renal disease: Secondary | ICD-10-CM | POA: Diagnosis not present

## 2019-08-05 DIAGNOSIS — E1129 Type 2 diabetes mellitus with other diabetic kidney complication: Secondary | ICD-10-CM | POA: Diagnosis not present

## 2019-08-05 DIAGNOSIS — E119 Type 2 diabetes mellitus without complications: Secondary | ICD-10-CM | POA: Diagnosis not present

## 2019-08-05 DIAGNOSIS — Z992 Dependence on renal dialysis: Secondary | ICD-10-CM | POA: Diagnosis not present

## 2019-08-05 DIAGNOSIS — N2581 Secondary hyperparathyroidism of renal origin: Secondary | ICD-10-CM | POA: Diagnosis not present

## 2019-08-05 DIAGNOSIS — D631 Anemia in chronic kidney disease: Secondary | ICD-10-CM | POA: Diagnosis not present

## 2019-08-08 DIAGNOSIS — N186 End stage renal disease: Secondary | ICD-10-CM | POA: Diagnosis not present

## 2019-08-08 DIAGNOSIS — E119 Type 2 diabetes mellitus without complications: Secondary | ICD-10-CM | POA: Diagnosis not present

## 2019-08-08 DIAGNOSIS — D631 Anemia in chronic kidney disease: Secondary | ICD-10-CM | POA: Diagnosis not present

## 2019-08-08 DIAGNOSIS — D509 Iron deficiency anemia, unspecified: Secondary | ICD-10-CM | POA: Diagnosis not present

## 2019-08-08 DIAGNOSIS — N2581 Secondary hyperparathyroidism of renal origin: Secondary | ICD-10-CM | POA: Diagnosis not present

## 2019-08-08 DIAGNOSIS — Z992 Dependence on renal dialysis: Secondary | ICD-10-CM | POA: Diagnosis not present

## 2019-08-10 DIAGNOSIS — N186 End stage renal disease: Secondary | ICD-10-CM | POA: Diagnosis not present

## 2019-08-10 DIAGNOSIS — Z992 Dependence on renal dialysis: Secondary | ICD-10-CM | POA: Diagnosis not present

## 2019-08-10 DIAGNOSIS — D509 Iron deficiency anemia, unspecified: Secondary | ICD-10-CM | POA: Diagnosis not present

## 2019-08-10 DIAGNOSIS — N2581 Secondary hyperparathyroidism of renal origin: Secondary | ICD-10-CM | POA: Diagnosis not present

## 2019-08-10 DIAGNOSIS — D631 Anemia in chronic kidney disease: Secondary | ICD-10-CM | POA: Diagnosis not present

## 2019-08-10 DIAGNOSIS — E119 Type 2 diabetes mellitus without complications: Secondary | ICD-10-CM | POA: Diagnosis not present

## 2019-08-10 DIAGNOSIS — E876 Hypokalemia: Secondary | ICD-10-CM | POA: Diagnosis not present

## 2019-08-10 DIAGNOSIS — I120 Hypertensive chronic kidney disease with stage 5 chronic kidney disease or end stage renal disease: Secondary | ICD-10-CM | POA: Diagnosis not present

## 2019-08-11 DIAGNOSIS — N186 End stage renal disease: Secondary | ICD-10-CM | POA: Diagnosis not present

## 2019-08-11 DIAGNOSIS — D509 Iron deficiency anemia, unspecified: Secondary | ICD-10-CM | POA: Diagnosis not present

## 2019-08-11 DIAGNOSIS — N2581 Secondary hyperparathyroidism of renal origin: Secondary | ICD-10-CM | POA: Diagnosis not present

## 2019-08-11 DIAGNOSIS — Z992 Dependence on renal dialysis: Secondary | ICD-10-CM | POA: Diagnosis not present

## 2019-08-11 DIAGNOSIS — D631 Anemia in chronic kidney disease: Secondary | ICD-10-CM | POA: Diagnosis not present

## 2019-08-11 DIAGNOSIS — E119 Type 2 diabetes mellitus without complications: Secondary | ICD-10-CM | POA: Diagnosis not present

## 2019-08-14 DIAGNOSIS — E119 Type 2 diabetes mellitus without complications: Secondary | ICD-10-CM | POA: Diagnosis not present

## 2019-08-14 DIAGNOSIS — D509 Iron deficiency anemia, unspecified: Secondary | ICD-10-CM | POA: Diagnosis not present

## 2019-08-14 DIAGNOSIS — Z992 Dependence on renal dialysis: Secondary | ICD-10-CM | POA: Diagnosis not present

## 2019-08-14 DIAGNOSIS — N186 End stage renal disease: Secondary | ICD-10-CM | POA: Diagnosis not present

## 2019-08-14 DIAGNOSIS — D631 Anemia in chronic kidney disease: Secondary | ICD-10-CM | POA: Diagnosis not present

## 2019-08-14 DIAGNOSIS — N2581 Secondary hyperparathyroidism of renal origin: Secondary | ICD-10-CM | POA: Diagnosis not present

## 2019-08-17 DIAGNOSIS — N186 End stage renal disease: Secondary | ICD-10-CM | POA: Diagnosis not present

## 2019-08-17 DIAGNOSIS — N2581 Secondary hyperparathyroidism of renal origin: Secondary | ICD-10-CM | POA: Diagnosis not present

## 2019-08-17 DIAGNOSIS — Z992 Dependence on renal dialysis: Secondary | ICD-10-CM | POA: Diagnosis not present

## 2019-08-17 DIAGNOSIS — D509 Iron deficiency anemia, unspecified: Secondary | ICD-10-CM | POA: Diagnosis not present

## 2019-08-17 DIAGNOSIS — D631 Anemia in chronic kidney disease: Secondary | ICD-10-CM | POA: Diagnosis not present

## 2019-08-17 DIAGNOSIS — E119 Type 2 diabetes mellitus without complications: Secondary | ICD-10-CM | POA: Diagnosis not present

## 2019-08-19 DIAGNOSIS — D509 Iron deficiency anemia, unspecified: Secondary | ICD-10-CM | POA: Diagnosis not present

## 2019-08-19 DIAGNOSIS — D631 Anemia in chronic kidney disease: Secondary | ICD-10-CM | POA: Diagnosis not present

## 2019-08-19 DIAGNOSIS — Z992 Dependence on renal dialysis: Secondary | ICD-10-CM | POA: Diagnosis not present

## 2019-08-19 DIAGNOSIS — E119 Type 2 diabetes mellitus without complications: Secondary | ICD-10-CM | POA: Diagnosis not present

## 2019-08-19 DIAGNOSIS — N2581 Secondary hyperparathyroidism of renal origin: Secondary | ICD-10-CM | POA: Diagnosis not present

## 2019-08-19 DIAGNOSIS — N186 End stage renal disease: Secondary | ICD-10-CM | POA: Diagnosis not present

## 2019-08-21 DIAGNOSIS — E119 Type 2 diabetes mellitus without complications: Secondary | ICD-10-CM | POA: Diagnosis not present

## 2019-08-21 DIAGNOSIS — N186 End stage renal disease: Secondary | ICD-10-CM | POA: Diagnosis not present

## 2019-08-21 DIAGNOSIS — D509 Iron deficiency anemia, unspecified: Secondary | ICD-10-CM | POA: Diagnosis not present

## 2019-08-21 DIAGNOSIS — N2581 Secondary hyperparathyroidism of renal origin: Secondary | ICD-10-CM | POA: Diagnosis not present

## 2019-08-21 DIAGNOSIS — D631 Anemia in chronic kidney disease: Secondary | ICD-10-CM | POA: Diagnosis not present

## 2019-08-21 DIAGNOSIS — Z992 Dependence on renal dialysis: Secondary | ICD-10-CM | POA: Diagnosis not present

## 2019-08-21 NOTE — Progress Notes (Signed)
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  Initial presentation c/w patient be under-dialyzed.  Repeat CT reassuring and do not think we need to pursue this further. - Continue HD targeting dry weight - Patient can f/u on PRN basis   MDM . I reviewed prior external note(s) from discharge summary by Dr. Starla Link on 06/01/2019 detailing original presentation and finding of chylothorax.  Review of patient's CT Chest 08/03/19 images reveals improved hilar and mediastinal adenopathy, improved bilateral pleural effusions. The patient's images have been independently reviewed by me.     End of visit medications:  Current Outpatient Medications:  .  aspirin EC 325 MG tablet, Take 325 mg by mouth at bedtime., Disp: , Rfl:  .  atorvastatin (LIPITOR) 40 MG tablet, Take 1 tablet (40 mg total) by mouth daily., Disp: 90 tablet, Rfl: 3 .  b complex vitamins tablet, Take 1 tablet by mouth at bedtime., Disp: , Rfl:  .  brimonidine (ALPHAGAN) 0.2 % ophthalmic solution, Place 1 drop into both eyes 2 (two) times daily. , Disp: , Rfl:  .  calcitRIOL (ROCALTROL) 0.5 MCG capsule, Take 1 capsule (0.5 mcg total) by mouth 3 (three) times a week., Disp: 30 capsule, Rfl: 0 .  diphenhydramine-acetaminophen (TYLENOL PM) 25-500 MG TABS tablet, Take 1 tablet by mouth at bedtime as needed (pain)., Disp: , Rfl:  .  dorzolamide-timolol (COSOPT) 22.3-6.8 MG/ML ophthalmic solution, Place 1 drop into both eyes 2 (two) times daily. , Disp: , Rfl:  .  lanthanum (FOSRENOL) 1000 MG chewable tablet, Chew 1,000 mg by mouth 3 (three) times daily with meals., Disp: , Rfl:  .  latanoprost (XALATAN) 0.005 % ophthalmic solution, Place 1 drop into both eyes at bedtime. , Disp: , Rfl:  .  metoprolol tartrate (LOPRESSOR) 25 MG tablet, Take 25 mg by mouth 2 (two) times daily., Disp: , Rfl: [place], MD [facility] 08/25/2019 9:08 AM     Subjective:   PATIENT ID: Shawn [name] GENDER: male DOB: Mar 31, 1961, MRN: [id number]  Chief Complaint  Patient presents with  . 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 Patient feels good overall    HPI Here for f/u of chylothoax.  Hx as below.  Repeat CT chest peformed 08/03/19 reveals improved pleural effusions and mediastinal adenopathy c/w this all being related to fluid overload.  Doing very well, minimal DOE.  Mostly limited by muscular deconditioning.   This is a 59 year old past medical history CVA, type 2 diabetes, hypertension, end-stage renal disease on intermittent dialysis Monday Wednesday Friday was admitted to the hospital in November 2020.  Following a syncopal episode.  During this work-up he had a right-sided pleural effusion that was milky on analysis found to have a triglyceride level of 176 and lymphocyte predominant consistent with a chylothorax.  Patient was seen in hospital follow-up by Wyn Quaker in December 2020.  Here today to establish care.  CT chest also revealed multiple enlarged mediastinal nodes.  Patient's right-sided thoracentesis of approximately 1500 cc, triglycerides 176, protein 4.4, pH 7.8, LDH 80, glucose 181, cholesterol 80, cell count 37% lymphocytes, cytology no evidence of malignant cells.  Patient was referred to me for evaluation of mediastinal adenopathy with an associated chylothorax.  OV 06/25/2019: Patient doing well today.  States that he is breathing much better after having regular sessions of dialysis.  Currently on dialysis Monday Wednesday  Friday.  He has been cognizant of his dry weight.  Patient denies fevers chills night sweats weight loss.  He has no hemoptysis.   Ancillary information including prior medications, full medical/surgical/family/social histoies, and PFTs (when available) are listed below and have been reviewed.   ROS + symptoms in bold Fevers, chills, weight loss Nausea,  vomiting, diarrhea Shortness of breath, wheezing, cough Chest pain, palpitations, lower ext edema   Objective:   Vitals:   08/25/19 0900  BP: (!) 160/90  Pulse: 84  Temp: (!) 97.2 F (36.2 C)  TempSrc: Temporal  SpO2: 96%  Weight: 164 lb (74.4 kg)  Height: 5\' 10"  (1.778 m)   96% on ra BMI Readings from Last 3 Encounters:  08/25/19 23.53 kg/m  06/25/19 20.93 kg/m  06/16/19 21.70 kg/m   Wt Readings from Last 3 Encounters:  08/25/19 164 lb (74.4 kg)  06/25/19 163 lb (73.9 kg)  06/16/19 160 lb (72.6 kg)    GEN: 59 year old cachetic man in no acute distress HEENT: trachea midline, mucus membranes moist, chronic unequal pupils, blind, cataract on R eye CV: Regular rate and rhythm, extremities are warm PULM: diminished at bases, no accessory muscle use GI: Soft, +BS EXT: significant muscle wasting NEURO: Moves all 4 extremities   Ancillary Information    Past Medical History:  Diagnosis Date  . Anemia   . Arthritis    patient denies  . Blind right eye   . Chronic kidney disease    Dialysis since 2011 MWF  . Complication of anesthesia   . Diabetes mellitus    type II  . Hypertension   . Legally blind   . No pertinent past medical history   . PONV (postoperative nausea and vomiting)    N/V- became dehydrated had to have IV fluids- 01/2015  . Shortness of breath dyspnea    when has too fluid  . Stroke [facility]) 2012   date per patient  . Syncope   . Unspecified cerebral artery occlusion with cerebral infarction 04/02/2013     Family History  Problem Relation Age of Onset  . Diabetes Mother   . Alzheimer's disease Mother   . Cancer Father   . Heart disease Father   . Cancer Sister   . Stroke Sister      Past Surgical History:  Procedure Laterality Date  . AV FISTULA PLACEMENT Right 05/12/2013   Procedure: INSERTION OF ARTERIOVENOUS (AV) GORE-TEX GRAFT ARM; ULTRASOUND GUIDED;  Surgeon: Conrad Greenwood, MD;  Location: [facility];  Service: Vascular;  Laterality:  Right;  . AV FISTULA PLACEMENT Left 01/18/2015   Procedure: INSERTION OF ARTERIOVENOUS GORE-TEX GRAFT LEFT UPPER ARM;  Surgeon: Conrad Knox, MD;  Location: [facility];  Service: Vascular;  Laterality: Left;  . COLONOSCOPY  06/2012  . EYE SURGERY Bilateral    retina surgery both eyes, cataract surgery both eyes  . HERNIA REPAIR     right inguinal  . INSERTION OF DIALYSIS CATHETER Right   . left arm graft  10/2010  . LIGATION ARTERIOVENOUS GORTEX GRAFT Left 05/03/2015   Procedure: LIGATION ARTERIOVENOUS GORTEX GRAFT-LEFT UPPER ARM;  Surgeon: Conrad Piedmont, MD;  Location: [facility];  Service: Vascular;  Laterality: Left;    Social History   Socioeconomic History  . Marital status: Married    Spouse name: Not on file  . Number of children: 0  . Years of education: 12th  . Highest education level: Not on file  Occupational History  Employer: UNEMPLOYED  Tobacco Use  . Smoking status: Never Smoker  . Smokeless tobacco: Never Used  Substance and Sexual Activity  . Alcohol use: No    Alcohol/week: 0.0 standard drinks  . Drug use: No  . Sexual activity: Not on file  Other Topics Concern  . Not on file  Social History Narrative   Patient lives at home with wife.    Patient is disabled.    Patient has no children.    Patient has a 12 grade education.       Social Determinants of Health   Financial Resource Strain:   . Difficulty of Paying Living Expenses: Not on file  Food Insecurity:   . Worried About Charity fundraiser in the Last Year: Not on file  . Ran Out of Food in the Last Year: Not on file  Transportation Needs:   . Lack of Transportation (Medical): Not on file  . Lack of Transportation (Non-Medical): Not on file  Physical Activity:   . Days of Exercise per Week: Not on file  . Minutes of Exercise per Session: Not on file  Stress:   . Feeling of Stress : Not on file  Social Connections:   . Frequency of Communication with Friends and Family: Not on file  . Frequency of  Social Gatherings with Friends and Family: Not on file  . Attends Religious Services: Not on file  . Active Member of Clubs or Organizations: Not on file  . Attends Archivist Meetings: Not on file  . Marital Status: Not on file  Intimate Partner Violence:   . Fear of Current or Ex-Partner: Not on file  . Emotionally Abused: Not on file  . Physically Abused: Not on file  . Sexually Abused: Not on file     No Known Allergies   CBC    Component Value Date/Time   WBC 4.4 06/01/2019 0302   RBC 4.18 (L) 06/01/2019 0302   HGB 12.7 (L) 06/01/2019 0302   HCT 38.0 (L) 06/01/2019 0302   PLT 161 06/01/2019 0302   MCV 90.9 06/01/2019 0302   MCH 30.4 06/01/2019 0302   MCHC 33.4 06/01/2019 0302   RDW 17.2 (H) 06/01/2019 0302   LYMPHSABS 0.5 (L) 06/01/2019 0302   MONOABS 0.8 06/01/2019 0302   EOSABS 0.2 06/01/2019 0302   BASOSABS 0.0 06/01/2019 0302    Pulmonary Functions Testing Results: No flowsheet data found.  Outpatient Medications Prior to Visit  Medication Sig Dispense Refill  . aspirin EC 325 MG tablet Take 325 mg by mouth at bedtime.    Marland Kitchen atorvastatin (LIPITOR) 40 MG tablet Take 1 tablet (40 mg total) by mouth daily. 90 tablet 3  . b complex vitamins tablet Take 1 tablet by mouth at bedtime.    . brimonidine (ALPHAGAN) 0.2 % ophthalmic solution Place 1 drop into both eyes 2 (two) times daily.     . calcitRIOL (ROCALTROL) 0.5 MCG capsule Take 1 capsule (0.5 mcg total) by mouth 3 (three) times a week. 30 capsule 0  . diphenhydramine-acetaminophen (TYLENOL PM) 25-500 MG TABS tablet Take 1 tablet by mouth at bedtime as needed (pain).    . dorzolamide-timolol (COSOPT) 22.3-6.8 MG/ML ophthalmic solution Place 1 drop into both eyes 2 (two) times daily.     Marland Kitchen lanthanum (FOSRENOL) 1000 MG chewable tablet Chew 1,000 mg by mouth 3 (three) times daily with meals.    . latanoprost (XALATAN) 0.005 % ophthalmic solution Place 1 drop into both eyes at  bedtime.     . metoprolol  tartrate (LOPRESSOR) 25 MG tablet Take 25 mg by mouth 2 (two) times daily.  11   No facility-administered medications prior to visit.

## 2019-08-24 DIAGNOSIS — D631 Anemia in chronic kidney disease: Secondary | ICD-10-CM | POA: Diagnosis not present

## 2019-08-24 DIAGNOSIS — N186 End stage renal disease: Secondary | ICD-10-CM | POA: Diagnosis not present

## 2019-08-24 DIAGNOSIS — N2581 Secondary hyperparathyroidism of renal origin: Secondary | ICD-10-CM | POA: Diagnosis not present

## 2019-08-24 DIAGNOSIS — Z992 Dependence on renal dialysis: Secondary | ICD-10-CM | POA: Diagnosis not present

## 2019-08-24 DIAGNOSIS — D509 Iron deficiency anemia, unspecified: Secondary | ICD-10-CM | POA: Diagnosis not present

## 2019-08-24 DIAGNOSIS — E119 Type 2 diabetes mellitus without complications: Secondary | ICD-10-CM | POA: Diagnosis not present

## 2019-08-25 ENCOUNTER — Encounter: Payer: Self-pay | Admitting: Internal Medicine

## 2019-08-25 ENCOUNTER — Ambulatory Visit (INDEPENDENT_AMBULATORY_CARE_PROVIDER_SITE_OTHER): Payer: Medicare Other | Admitting: Internal Medicine

## 2019-08-25 ENCOUNTER — Other Ambulatory Visit: Payer: Self-pay

## 2019-08-25 VITALS — BP 160/90 | HR 84 | Temp 97.2°F | Ht 70.0 in | Wt 164.0 lb

## 2019-08-25 DIAGNOSIS — R918 Other nonspecific abnormal finding of lung field: Secondary | ICD-10-CM

## 2019-08-25 NOTE — Patient Instructions (Signed)
Come back if breathing worse.  Have a good day!

## 2019-08-26 DIAGNOSIS — N186 End stage renal disease: Secondary | ICD-10-CM | POA: Diagnosis not present

## 2019-08-26 DIAGNOSIS — D631 Anemia in chronic kidney disease: Secondary | ICD-10-CM | POA: Diagnosis not present

## 2019-08-26 DIAGNOSIS — N2581 Secondary hyperparathyroidism of renal origin: Secondary | ICD-10-CM | POA: Diagnosis not present

## 2019-08-26 DIAGNOSIS — D509 Iron deficiency anemia, unspecified: Secondary | ICD-10-CM | POA: Diagnosis not present

## 2019-08-26 DIAGNOSIS — Z992 Dependence on renal dialysis: Secondary | ICD-10-CM | POA: Diagnosis not present

## 2019-08-26 DIAGNOSIS — E119 Type 2 diabetes mellitus without complications: Secondary | ICD-10-CM | POA: Diagnosis not present

## 2019-08-28 DIAGNOSIS — D509 Iron deficiency anemia, unspecified: Secondary | ICD-10-CM | POA: Diagnosis not present

## 2019-08-28 DIAGNOSIS — Z992 Dependence on renal dialysis: Secondary | ICD-10-CM | POA: Diagnosis not present

## 2019-08-28 DIAGNOSIS — E119 Type 2 diabetes mellitus without complications: Secondary | ICD-10-CM | POA: Diagnosis not present

## 2019-08-28 DIAGNOSIS — D631 Anemia in chronic kidney disease: Secondary | ICD-10-CM | POA: Diagnosis not present

## 2019-08-28 DIAGNOSIS — N186 End stage renal disease: Secondary | ICD-10-CM | POA: Diagnosis not present

## 2019-08-28 DIAGNOSIS — N2581 Secondary hyperparathyroidism of renal origin: Secondary | ICD-10-CM | POA: Diagnosis not present

## 2019-08-31 DIAGNOSIS — D631 Anemia in chronic kidney disease: Secondary | ICD-10-CM | POA: Diagnosis not present

## 2019-08-31 DIAGNOSIS — E119 Type 2 diabetes mellitus without complications: Secondary | ICD-10-CM | POA: Diagnosis not present

## 2019-08-31 DIAGNOSIS — Z992 Dependence on renal dialysis: Secondary | ICD-10-CM | POA: Diagnosis not present

## 2019-08-31 DIAGNOSIS — N186 End stage renal disease: Secondary | ICD-10-CM | POA: Diagnosis not present

## 2019-08-31 DIAGNOSIS — D509 Iron deficiency anemia, unspecified: Secondary | ICD-10-CM | POA: Diagnosis not present

## 2019-08-31 DIAGNOSIS — N2581 Secondary hyperparathyroidism of renal origin: Secondary | ICD-10-CM | POA: Diagnosis not present

## 2019-09-01 ENCOUNTER — Ambulatory Visit: Payer: Medicare Other | Admitting: Podiatry

## 2019-09-02 DIAGNOSIS — E119 Type 2 diabetes mellitus without complications: Secondary | ICD-10-CM | POA: Diagnosis not present

## 2019-09-02 DIAGNOSIS — D631 Anemia in chronic kidney disease: Secondary | ICD-10-CM | POA: Diagnosis not present

## 2019-09-02 DIAGNOSIS — N2581 Secondary hyperparathyroidism of renal origin: Secondary | ICD-10-CM | POA: Diagnosis not present

## 2019-09-02 DIAGNOSIS — D509 Iron deficiency anemia, unspecified: Secondary | ICD-10-CM | POA: Diagnosis not present

## 2019-09-02 DIAGNOSIS — N186 End stage renal disease: Secondary | ICD-10-CM | POA: Diagnosis not present

## 2019-09-02 DIAGNOSIS — Z992 Dependence on renal dialysis: Secondary | ICD-10-CM | POA: Diagnosis not present

## 2019-09-03 DIAGNOSIS — I119 Hypertensive heart disease without heart failure: Secondary | ICD-10-CM | POA: Diagnosis not present

## 2019-09-03 DIAGNOSIS — E7800 Pure hypercholesterolemia, unspecified: Secondary | ICD-10-CM | POA: Diagnosis not present

## 2019-09-03 DIAGNOSIS — L299 Pruritus, unspecified: Secondary | ICD-10-CM | POA: Diagnosis not present

## 2019-09-03 DIAGNOSIS — I999 Unspecified disorder of circulatory system: Secondary | ICD-10-CM | POA: Diagnosis not present

## 2019-09-03 DIAGNOSIS — E1165 Type 2 diabetes mellitus with hyperglycemia: Secondary | ICD-10-CM | POA: Diagnosis not present

## 2019-09-03 DIAGNOSIS — I6990 Unspecified sequelae of unspecified cerebrovascular disease: Secondary | ICD-10-CM | POA: Diagnosis not present

## 2019-09-03 DIAGNOSIS — N186 End stage renal disease: Secondary | ICD-10-CM | POA: Diagnosis not present

## 2019-09-03 DIAGNOSIS — R55 Syncope and collapse: Secondary | ICD-10-CM | POA: Diagnosis not present

## 2019-09-03 DIAGNOSIS — E559 Vitamin D deficiency, unspecified: Secondary | ICD-10-CM | POA: Diagnosis not present

## 2019-09-03 DIAGNOSIS — H547 Unspecified visual loss: Secondary | ICD-10-CM | POA: Diagnosis not present

## 2019-09-03 DIAGNOSIS — S27309D Unspecified injury of lung, unspecified, subsequent encounter: Secondary | ICD-10-CM | POA: Diagnosis not present

## 2019-09-04 DIAGNOSIS — N2581 Secondary hyperparathyroidism of renal origin: Secondary | ICD-10-CM | POA: Diagnosis not present

## 2019-09-04 DIAGNOSIS — D509 Iron deficiency anemia, unspecified: Secondary | ICD-10-CM | POA: Diagnosis not present

## 2019-09-04 DIAGNOSIS — Z992 Dependence on renal dialysis: Secondary | ICD-10-CM | POA: Diagnosis not present

## 2019-09-04 DIAGNOSIS — N186 End stage renal disease: Secondary | ICD-10-CM | POA: Diagnosis not present

## 2019-09-04 DIAGNOSIS — D631 Anemia in chronic kidney disease: Secondary | ICD-10-CM | POA: Diagnosis not present

## 2019-09-04 DIAGNOSIS — E119 Type 2 diabetes mellitus without complications: Secondary | ICD-10-CM | POA: Diagnosis not present

## 2019-09-07 DIAGNOSIS — D631 Anemia in chronic kidney disease: Secondary | ICD-10-CM | POA: Diagnosis not present

## 2019-09-07 DIAGNOSIS — N186 End stage renal disease: Secondary | ICD-10-CM | POA: Diagnosis not present

## 2019-09-07 DIAGNOSIS — I120 Hypertensive chronic kidney disease with stage 5 chronic kidney disease or end stage renal disease: Secondary | ICD-10-CM | POA: Diagnosis not present

## 2019-09-07 DIAGNOSIS — N2581 Secondary hyperparathyroidism of renal origin: Secondary | ICD-10-CM | POA: Diagnosis not present

## 2019-09-07 DIAGNOSIS — D509 Iron deficiency anemia, unspecified: Secondary | ICD-10-CM | POA: Diagnosis not present

## 2019-09-07 DIAGNOSIS — Z992 Dependence on renal dialysis: Secondary | ICD-10-CM | POA: Diagnosis not present

## 2019-09-07 DIAGNOSIS — E876 Hypokalemia: Secondary | ICD-10-CM | POA: Diagnosis not present

## 2019-09-07 DIAGNOSIS — E119 Type 2 diabetes mellitus without complications: Secondary | ICD-10-CM | POA: Diagnosis not present

## 2019-09-09 DIAGNOSIS — D509 Iron deficiency anemia, unspecified: Secondary | ICD-10-CM | POA: Diagnosis not present

## 2019-09-09 DIAGNOSIS — N186 End stage renal disease: Secondary | ICD-10-CM | POA: Diagnosis not present

## 2019-09-09 DIAGNOSIS — E876 Hypokalemia: Secondary | ICD-10-CM | POA: Diagnosis not present

## 2019-09-09 DIAGNOSIS — Z992 Dependence on renal dialysis: Secondary | ICD-10-CM | POA: Diagnosis not present

## 2019-09-09 DIAGNOSIS — N2581 Secondary hyperparathyroidism of renal origin: Secondary | ICD-10-CM | POA: Diagnosis not present

## 2019-09-09 DIAGNOSIS — D631 Anemia in chronic kidney disease: Secondary | ICD-10-CM | POA: Diagnosis not present

## 2019-09-11 DIAGNOSIS — D509 Iron deficiency anemia, unspecified: Secondary | ICD-10-CM | POA: Diagnosis not present

## 2019-09-11 DIAGNOSIS — N186 End stage renal disease: Secondary | ICD-10-CM | POA: Diagnosis not present

## 2019-09-11 DIAGNOSIS — Z992 Dependence on renal dialysis: Secondary | ICD-10-CM | POA: Diagnosis not present

## 2019-09-11 DIAGNOSIS — D631 Anemia in chronic kidney disease: Secondary | ICD-10-CM | POA: Diagnosis not present

## 2019-09-11 DIAGNOSIS — E876 Hypokalemia: Secondary | ICD-10-CM | POA: Diagnosis not present

## 2019-09-11 DIAGNOSIS — N2581 Secondary hyperparathyroidism of renal origin: Secondary | ICD-10-CM | POA: Diagnosis not present

## 2019-09-14 DIAGNOSIS — N186 End stage renal disease: Secondary | ICD-10-CM | POA: Diagnosis not present

## 2019-09-14 DIAGNOSIS — N2581 Secondary hyperparathyroidism of renal origin: Secondary | ICD-10-CM | POA: Diagnosis not present

## 2019-09-14 DIAGNOSIS — Z992 Dependence on renal dialysis: Secondary | ICD-10-CM | POA: Diagnosis not present

## 2019-09-14 DIAGNOSIS — D631 Anemia in chronic kidney disease: Secondary | ICD-10-CM | POA: Diagnosis not present

## 2019-09-14 DIAGNOSIS — E876 Hypokalemia: Secondary | ICD-10-CM | POA: Diagnosis not present

## 2019-09-14 DIAGNOSIS — D509 Iron deficiency anemia, unspecified: Secondary | ICD-10-CM | POA: Diagnosis not present

## 2019-09-15 ENCOUNTER — Other Ambulatory Visit: Payer: Self-pay

## 2019-09-15 ENCOUNTER — Ambulatory Visit (INDEPENDENT_AMBULATORY_CARE_PROVIDER_SITE_OTHER): Payer: Medicare Other | Admitting: Podiatry

## 2019-09-15 ENCOUNTER — Encounter: Payer: Self-pay | Admitting: Podiatry

## 2019-09-15 VITALS — Temp 96.5°F

## 2019-09-15 DIAGNOSIS — I739 Peripheral vascular disease, unspecified: Secondary | ICD-10-CM | POA: Diagnosis not present

## 2019-09-15 DIAGNOSIS — E118 Type 2 diabetes mellitus with unspecified complications: Secondary | ICD-10-CM | POA: Diagnosis not present

## 2019-09-15 DIAGNOSIS — Z992 Dependence on renal dialysis: Secondary | ICD-10-CM

## 2019-09-15 DIAGNOSIS — M79674 Pain in right toe(s): Secondary | ICD-10-CM

## 2019-09-15 DIAGNOSIS — N186 End stage renal disease: Secondary | ICD-10-CM

## 2019-09-15 DIAGNOSIS — B351 Tinea unguium: Secondary | ICD-10-CM | POA: Diagnosis not present

## 2019-09-15 DIAGNOSIS — M79675 Pain in left toe(s): Secondary | ICD-10-CM | POA: Diagnosis not present

## 2019-09-15 NOTE — Progress Notes (Signed)
This patient returns to my office for at risk foot care.  This patient requires this care by a professional since this patient will be at risk due to having  Diabetes with angiopathy and neuropathy.  Patient presents to the office with his wife. This patient is unable to cut nails themselves since the patient cannot reach their nails.These nails are painful walking and wearing shoes.  This patient presents for at risk foot care today.  General Appearance  Alert, conversant and in no acute stress.  Vascular  Dorsalis pedis and posterior tibial  pulses are not  palpable  bilaterally.  Capillary return is within normal limits  Bilaterally.Cold feet.  bilaterally.  Neurologic  Senn-Weinstein monofilament wire test diminished  bilaterally. Muscle power within normal limits bilaterally.  Nails Thick disfigured discolored nails with subungual debris  from hallux to fifth toes bilaterally. No evidence of bacterial infection or drainage bilaterally.  Orthopedic  No limitations of motion  feet .  No crepitus or effusions noted.  HAV  B/L.  Hammer toe 2  B/L.  Skin  normotropic skin with no porokeratosis noted bilaterally.  No signs of infections or ulcers noted.     Onychomycosis  Pain in right toe  Pain in left toe.  Consent was obtained for treatment procedures.  Debridement and grinding of long thick nails with clearing of subungual debris.  No infection or ulcer.     Return office visit  4 months        Told patient to return for periodic foot care and evaluation due to potential at risk complications.   Gardiner Barefoot DPM

## 2019-09-16 DIAGNOSIS — Z992 Dependence on renal dialysis: Secondary | ICD-10-CM | POA: Diagnosis not present

## 2019-09-16 DIAGNOSIS — D631 Anemia in chronic kidney disease: Secondary | ICD-10-CM | POA: Diagnosis not present

## 2019-09-16 DIAGNOSIS — N186 End stage renal disease: Secondary | ICD-10-CM | POA: Diagnosis not present

## 2019-09-16 DIAGNOSIS — D509 Iron deficiency anemia, unspecified: Secondary | ICD-10-CM | POA: Diagnosis not present

## 2019-09-16 DIAGNOSIS — N2581 Secondary hyperparathyroidism of renal origin: Secondary | ICD-10-CM | POA: Diagnosis not present

## 2019-09-16 DIAGNOSIS — E876 Hypokalemia: Secondary | ICD-10-CM | POA: Diagnosis not present

## 2019-09-17 ENCOUNTER — Other Ambulatory Visit: Payer: Self-pay | Admitting: Cardiology

## 2019-09-17 DIAGNOSIS — E782 Mixed hyperlipidemia: Secondary | ICD-10-CM

## 2019-09-18 DIAGNOSIS — E876 Hypokalemia: Secondary | ICD-10-CM | POA: Diagnosis not present

## 2019-09-18 DIAGNOSIS — N186 End stage renal disease: Secondary | ICD-10-CM | POA: Diagnosis not present

## 2019-09-18 DIAGNOSIS — N2581 Secondary hyperparathyroidism of renal origin: Secondary | ICD-10-CM | POA: Diagnosis not present

## 2019-09-18 DIAGNOSIS — D509 Iron deficiency anemia, unspecified: Secondary | ICD-10-CM | POA: Diagnosis not present

## 2019-09-18 DIAGNOSIS — D631 Anemia in chronic kidney disease: Secondary | ICD-10-CM | POA: Diagnosis not present

## 2019-09-18 DIAGNOSIS — Z992 Dependence on renal dialysis: Secondary | ICD-10-CM | POA: Diagnosis not present

## 2019-09-21 DIAGNOSIS — N186 End stage renal disease: Secondary | ICD-10-CM | POA: Diagnosis not present

## 2019-09-21 DIAGNOSIS — E876 Hypokalemia: Secondary | ICD-10-CM | POA: Diagnosis not present

## 2019-09-21 DIAGNOSIS — D631 Anemia in chronic kidney disease: Secondary | ICD-10-CM | POA: Diagnosis not present

## 2019-09-21 DIAGNOSIS — D509 Iron deficiency anemia, unspecified: Secondary | ICD-10-CM | POA: Diagnosis not present

## 2019-09-21 DIAGNOSIS — N2581 Secondary hyperparathyroidism of renal origin: Secondary | ICD-10-CM | POA: Diagnosis not present

## 2019-09-21 DIAGNOSIS — Z992 Dependence on renal dialysis: Secondary | ICD-10-CM | POA: Diagnosis not present

## 2019-09-23 DIAGNOSIS — D509 Iron deficiency anemia, unspecified: Secondary | ICD-10-CM | POA: Diagnosis not present

## 2019-09-23 DIAGNOSIS — E876 Hypokalemia: Secondary | ICD-10-CM | POA: Diagnosis not present

## 2019-09-23 DIAGNOSIS — N186 End stage renal disease: Secondary | ICD-10-CM | POA: Diagnosis not present

## 2019-09-23 DIAGNOSIS — N2581 Secondary hyperparathyroidism of renal origin: Secondary | ICD-10-CM | POA: Diagnosis not present

## 2019-09-23 DIAGNOSIS — Z992 Dependence on renal dialysis: Secondary | ICD-10-CM | POA: Diagnosis not present

## 2019-09-23 DIAGNOSIS — D631 Anemia in chronic kidney disease: Secondary | ICD-10-CM | POA: Diagnosis not present

## 2019-09-25 DIAGNOSIS — D509 Iron deficiency anemia, unspecified: Secondary | ICD-10-CM | POA: Diagnosis not present

## 2019-09-25 DIAGNOSIS — D631 Anemia in chronic kidney disease: Secondary | ICD-10-CM | POA: Diagnosis not present

## 2019-09-25 DIAGNOSIS — Z992 Dependence on renal dialysis: Secondary | ICD-10-CM | POA: Diagnosis not present

## 2019-09-25 DIAGNOSIS — N2581 Secondary hyperparathyroidism of renal origin: Secondary | ICD-10-CM | POA: Diagnosis not present

## 2019-09-25 DIAGNOSIS — E876 Hypokalemia: Secondary | ICD-10-CM | POA: Diagnosis not present

## 2019-09-25 DIAGNOSIS — N186 End stage renal disease: Secondary | ICD-10-CM | POA: Diagnosis not present

## 2019-09-26 DIAGNOSIS — E876 Hypokalemia: Secondary | ICD-10-CM | POA: Diagnosis not present

## 2019-09-26 DIAGNOSIS — N2581 Secondary hyperparathyroidism of renal origin: Secondary | ICD-10-CM | POA: Diagnosis not present

## 2019-09-26 DIAGNOSIS — N186 End stage renal disease: Secondary | ICD-10-CM | POA: Diagnosis not present

## 2019-09-26 DIAGNOSIS — D509 Iron deficiency anemia, unspecified: Secondary | ICD-10-CM | POA: Diagnosis not present

## 2019-09-26 DIAGNOSIS — D631 Anemia in chronic kidney disease: Secondary | ICD-10-CM | POA: Diagnosis not present

## 2019-09-26 DIAGNOSIS — Z992 Dependence on renal dialysis: Secondary | ICD-10-CM | POA: Diagnosis not present

## 2019-09-30 DIAGNOSIS — E876 Hypokalemia: Secondary | ICD-10-CM | POA: Diagnosis not present

## 2019-09-30 DIAGNOSIS — D631 Anemia in chronic kidney disease: Secondary | ICD-10-CM | POA: Diagnosis not present

## 2019-09-30 DIAGNOSIS — Z992 Dependence on renal dialysis: Secondary | ICD-10-CM | POA: Diagnosis not present

## 2019-09-30 DIAGNOSIS — D509 Iron deficiency anemia, unspecified: Secondary | ICD-10-CM | POA: Diagnosis not present

## 2019-09-30 DIAGNOSIS — N2581 Secondary hyperparathyroidism of renal origin: Secondary | ICD-10-CM | POA: Diagnosis not present

## 2019-09-30 DIAGNOSIS — N186 End stage renal disease: Secondary | ICD-10-CM | POA: Diagnosis not present

## 2019-10-02 DIAGNOSIS — D631 Anemia in chronic kidney disease: Secondary | ICD-10-CM | POA: Diagnosis not present

## 2019-10-02 DIAGNOSIS — N2581 Secondary hyperparathyroidism of renal origin: Secondary | ICD-10-CM | POA: Diagnosis not present

## 2019-10-02 DIAGNOSIS — E876 Hypokalemia: Secondary | ICD-10-CM | POA: Diagnosis not present

## 2019-10-02 DIAGNOSIS — D509 Iron deficiency anemia, unspecified: Secondary | ICD-10-CM | POA: Diagnosis not present

## 2019-10-02 DIAGNOSIS — N186 End stage renal disease: Secondary | ICD-10-CM | POA: Diagnosis not present

## 2019-10-02 DIAGNOSIS — Z992 Dependence on renal dialysis: Secondary | ICD-10-CM | POA: Diagnosis not present

## 2019-10-05 DIAGNOSIS — Z992 Dependence on renal dialysis: Secondary | ICD-10-CM | POA: Diagnosis not present

## 2019-10-05 DIAGNOSIS — N186 End stage renal disease: Secondary | ICD-10-CM | POA: Diagnosis not present

## 2019-10-05 DIAGNOSIS — N2581 Secondary hyperparathyroidism of renal origin: Secondary | ICD-10-CM | POA: Diagnosis not present

## 2019-10-05 DIAGNOSIS — E876 Hypokalemia: Secondary | ICD-10-CM | POA: Diagnosis not present

## 2019-10-05 DIAGNOSIS — D631 Anemia in chronic kidney disease: Secondary | ICD-10-CM | POA: Diagnosis not present

## 2019-10-05 DIAGNOSIS — D509 Iron deficiency anemia, unspecified: Secondary | ICD-10-CM | POA: Diagnosis not present

## 2019-10-07 ENCOUNTER — Encounter (HOSPITAL_COMMUNITY): Payer: Self-pay

## 2019-10-07 ENCOUNTER — Emergency Department (HOSPITAL_COMMUNITY)
Admission: EM | Admit: 2019-10-07 | Discharge: 2019-10-07 | Disposition: A | Discharge status: Home / Self Care | Payer: Medicare Other | Attending: Emergency Medicine | Admitting: Emergency Medicine

## 2019-10-07 ENCOUNTER — Other Ambulatory Visit: Payer: Self-pay

## 2019-10-07 DIAGNOSIS — D509 Iron deficiency anemia, unspecified: Secondary | ICD-10-CM | POA: Diagnosis not present

## 2019-10-07 DIAGNOSIS — G4489 Other headache syndrome: Secondary | ICD-10-CM | POA: Diagnosis not present

## 2019-10-07 DIAGNOSIS — Z79899 Other long term (current) drug therapy: Secondary | ICD-10-CM | POA: Insufficient documentation

## 2019-10-07 DIAGNOSIS — N186 End stage renal disease: Secondary | ICD-10-CM | POA: Diagnosis not present

## 2019-10-07 DIAGNOSIS — R55 Syncope and collapse: Secondary | ICD-10-CM

## 2019-10-07 DIAGNOSIS — I959 Hypotension, unspecified: Secondary | ICD-10-CM | POA: Diagnosis not present

## 2019-10-07 DIAGNOSIS — D631 Anemia in chronic kidney disease: Secondary | ICD-10-CM | POA: Diagnosis not present

## 2019-10-07 DIAGNOSIS — I120 Hypertensive chronic kidney disease with stage 5 chronic kidney disease or end stage renal disease: Secondary | ICD-10-CM | POA: Insufficient documentation

## 2019-10-07 DIAGNOSIS — E876 Hypokalemia: Secondary | ICD-10-CM | POA: Diagnosis not present

## 2019-10-07 DIAGNOSIS — E1122 Type 2 diabetes mellitus with diabetic chronic kidney disease: Secondary | ICD-10-CM | POA: Insufficient documentation

## 2019-10-07 DIAGNOSIS — Z7982 Long term (current) use of aspirin: Secondary | ICD-10-CM | POA: Diagnosis not present

## 2019-10-07 DIAGNOSIS — Z992 Dependence on renal dialysis: Secondary | ICD-10-CM | POA: Insufficient documentation

## 2019-10-07 DIAGNOSIS — N2581 Secondary hyperparathyroidism of renal origin: Secondary | ICD-10-CM | POA: Diagnosis not present

## 2019-10-07 DIAGNOSIS — R0902 Hypoxemia: Secondary | ICD-10-CM | POA: Diagnosis not present

## 2019-10-07 LAB — BASIC METABOLIC PANEL
Anion gap: 13 (ref 5–15)
BUN: 18 mg/dL (ref 6–20)
CO2: 26 mmol/L (ref 22–32)
Calcium: 8.4 mg/dL — ABNORMAL LOW (ref 8.9–10.3)
Chloride: 95 mmol/L — ABNORMAL LOW (ref 98–111)
Creatinine, Ser: 4.39 mg/dL — ABNORMAL HIGH (ref 0.61–1.24)
GFR calc Af Amer: 16 mL/min — ABNORMAL LOW (ref >60)
GFR calc non Af Amer: 14 mL/min — ABNORMAL LOW (ref >60)
Glucose, Bld: 222 mg/dL — ABNORMAL HIGH (ref 70–99)
Potassium: 3.4 mmol/L — ABNORMAL LOW (ref 3.5–5.1)
Sodium: 134 mmol/L — ABNORMAL LOW (ref 135–145)

## 2019-10-07 LAB — CBC
HCT: 38.7 % — ABNORMAL LOW (ref 39.0–52.0)
Hemoglobin: 12.5 g/dL — ABNORMAL LOW (ref 13.0–17.0)
MCH: 30.8 pg (ref 26.0–34.0)
MCHC: 32.3 g/dL (ref 30.0–36.0)
MCV: 95.3 fL (ref 80.0–100.0)
Platelets: 280 10*3/uL (ref 150–400)
RBC: 4.06 MIL/uL — ABNORMAL LOW (ref 4.22–5.81)
RDW: 13.2 % (ref 11.5–15.5)
WBC: 6.4 10*3/uL (ref 4.0–10.5)
nRBC: 0 % (ref 0.0–0.2)

## 2019-10-07 LAB — CBG MONITORING, ED: Glucose-Capillary: 183 mg/dL — ABNORMAL HIGH (ref 70–99)

## 2019-10-07 MED ORDER — SODIUM CHLORIDE 0.9 % IV BOLUS
250.0000 mL | Freq: Once | INTRAVENOUS | Status: AC
  Administered 2019-10-07: 250 mL via INTRAVENOUS

## 2019-10-07 MED ORDER — SODIUM CHLORIDE 0.9% FLUSH: 3.0000 mL | Freq: Once | INTRAVENOUS | Status: DC

## 2019-10-07 NOTE — Discharge Instructions (Addendum)
Please remember in the future after receiving dialysis to make sure that you drink enough fluids and do not overexert yourself.  It is very common to lose consciousness after a dialysis session because of low blood pressure.  If you have any new or worsening symptoms please do not hesitate to come to the emergency department in the future.  It was a pleasure to meet you.

## 2019-10-07 NOTE — ED Triage Notes (Addendum)
Pt bib gcems from home w/ c/o syncope. Pt completed his dialysis today and was on the bus on his way home when he had a syncopal episode. Pt initially hypotensive w/ BP 90/58. Pt received 150 mL NS w/ EMS and BP up to 128/90. 12 lead EKG unremarkable. CBG 247. Resp 18, SPO2 97% on RA. AOx4, neuro intact.

## 2019-10-07 NOTE — ED Notes (Signed)
Patient verbalizes understanding of discharge instructions. Opportunity for questioning and answers were provided. Armband removed by staff, pt discharged from ED ambulatory.   

## 2019-10-07 NOTE — ED Notes (Signed)
CBG Results of 183 reported to Annie Main, Therapist, sports.

## 2019-10-07 NOTE — ED Provider Notes (Signed)
[facility] EMERGENCY DEPARTMENT Provider Note   CSN: [id number] Arrival date & time: 10/07/19  1142     History Chief Complaint  Patient presents with  . Loss of Consciousness    Shawn [name] is a 59 y.o. male.  HPI 59 y.o. male with complaints/comments per nursing/medical assistant note, with all such history reviewed for accuracy and confirmed by myself.  The patient's relevant past medical, surgical and social history was reviewed in [place].  HPI Comments: Shawn [name] is a 59 y.o. male with a history of ESRD on HD who presents to the Emergency Department complaining of a syncopal episode that occurred just prior to arrival.  Patient states that he was dialyzed today and felt like "they took too much off".  He was immediately lightheaded and was taking the bus back home and had a syncopal episode while sitting on the bus.  He is unsure of how long it lasted.  He states it was witnessed and was not told that he experienced any seizure-like activity.  Patient has no complaints at this time.  He denies any fevers, chills, URI symptoms, chest pain, shortness of breath, urinary symptoms.  Per nursing note, pt completed his dialysis today and was on the bus on his way home when he had a syncopal episode. Pt initially hypotensive w/ BP 90/58. Pt received 150 mL NS w/ EMS and BP up to 128/90. 12 lead EKG unremarkable. CBG 247. Resp 18, SPO2 97% on RA. AOx4, neuro intact.      Past Medical History:  Diagnosis Date  . Anemia   . Arthritis    patient denies  . Blind right eye   . Chronic kidney disease    Dialysis since 2011 MWF  . Complication of anesthesia   . Diabetes mellitus    type II  . Hypertension   . Legally blind   . No pertinent past medical history   . PONV (postoperative nausea and vomiting)    N/V- became dehydrated had to have IV fluids- 01/2015  . Shortness of breath dyspnea    when has too fluid  . Stroke [facility]) 2012   date per  patient  . Syncope   . Unspecified cerebral artery occlusion with cerebral infarction 04/02/2013    Patient Active Problem List   Diagnosis Date Noted  . Skin lesion 06/25/2019  . Breast lump 06/25/2019  . Chronic systolic heart failure ([place]) 06/24/2019  . Abnormal findings on diagnostic imaging of lung 06/15/2019  . Shortness of breath 06/15/2019  . 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 S/P thoracentesis   . Acute respiratory failure with hypercapnia ([place]) 05/25/2019  . Acute hypercapnic respiratory failure ([place]) 05/25/2019  . History of CVA (cerebrovascular accident) 05/05/2019  . Pure hypercholesterolemia 05/05/2019  . Pain due to onychomycosis of toenails of both feet 12/23/2018  . Heart palpitations 12/21/2017  . Tingling of left upper extremity 12/21/2017  . Syncope 01/23/2016  . Hypertension 01/23/2016  . Orthostatic hypotension 01/23/2016  . Legally blind   . Diabetes ([place])   . Diabetes mellitus with complication ([place])   . Intracranial atherosclerosis 08/10/2014  . End stage renal disease ([place]) 06/12/2013  . ESRD (end stage renal disease) on dialysis ([place]) 04/17/2013  . Cerebral artery occlusion with cerebral infarction ([place]) 04/02/2013  . Dizziness and giddiness 04/02/2013    Past Surgical History:  Procedure Laterality Date  . AV FISTULA PLACEMENT Right 05/12/2013   Procedure: INSERTION OF ARTERIOVENOUS (AV) GORE-TEX GRAFT ARM; ULTRASOUND GUIDED;  Surgeon: Conrad Goshen, MD;  Location: [facility];  Service: Vascular;  Laterality: Right;  . AV FISTULA PLACEMENT Left 01/18/2015   Procedure: INSERTION OF ARTERIOVENOUS GORE-TEX GRAFT LEFT UPPER ARM;  Surgeon: Conrad [place] Ranch, MD;  Location: [facility];  Service: Vascular;  Laterality: Left;  . COLONOSCOPY  06/2012  . EYE SURGERY Bilateral    retina surgery both eyes, cataract surgery both eyes  . HERNIA REPAIR     right inguinal  . INSERTION OF DIALYSIS CATHETER Right   . left arm graft  10/2010  . LIGATION ARTERIOVENOUS GORTEX GRAFT  Left 05/03/2015   Procedure: LIGATION ARTERIOVENOUS GORTEX GRAFT-LEFT UPPER ARM;  Surgeon: Conrad Osage, MD;  Location: [facility];  Service: Vascular;  Laterality: Left;       Family History  Problem Relation Age of Onset  . Diabetes Mother   . Alzheimer's disease Mother   . Cancer Father   . Heart disease Father   . Cancer Sister   . Stroke Sister     Social History   Tobacco Use  . Smoking status: Never Smoker  . Smokeless tobacco: Never Used  Substance Use Topics  . Alcohol use: No    Alcohol/week: 0.0 standard drinks  . Drug use: No    Home Medications Prior to Admission medications   Medication Sig Start Date End Date Taking? Authorizing Provider  aspirin EC 325 MG tablet Take 325 mg by mouth at bedtime.    [provider]  atorvastatin (LIPITOR) 40 MG tablet Take 1 tablet by mouth once daily 09/18/19   Buford Dresser, MD  b complex vitamins tablet Take 1 tablet by mouth at bedtime.    [provider]  brimonidine (ALPHAGAN) 0.2 % ophthalmic solution Place 1 drop into both eyes 2 (two) times daily.  11/19/18   [provider]  calcitRIOL (ROCALTROL) 0.5 MCG capsule Take 1 capsule (0.5 mcg total) by mouth 3 (three) times a week. 06/03/19   Aline August, MD  diphenhydramine-acetaminophen (TYLENOL PM) 25-500 MG TABS tablet Take 1 tablet by mouth at bedtime as needed (pain).    [provider]  dorzolamide-timolol (COSOPT) 22.3-6.8 MG/ML ophthalmic solution Place 1 drop into both eyes 2 (two) times daily.     [provider]  hydrOXYzine (ATARAX/VISTARIL) 10 MG tablet Take 10 mg by mouth daily as needed. 09/07/19   [provider]  lanthanum (FOSRENOL) 1000 MG chewable tablet Chew 1,000 mg by mouth 3 (three) times daily with meals.    [provider]  latanoprost (XALATAN) 0.005 % ophthalmic solution Place 1 drop into both eyes at bedtime.     [provider]  metoprolol tartrate (LOPRESSOR) 25 MG  tablet Take 25 mg by mouth 2 (two) times daily. 12/25/17   [provider]    Allergies    Patient has no known allergies.  Review of Systems   Review of Systems Ten systems reviewed and are negative for acute change, except as noted in the HPI.  Physical Exam Updated Vital Signs BP (!) 147/68 (BP Location: Right Arm)   Pulse (!) 58   Temp (!) 97.5 F (36.4 C) (Oral)   Resp 10   Ht 5\' 11"  (1.803 m)   Wt 72.6 kg   SpO2 100%   BMI 22.32 kg/m   Physical Exam Vitals and nursing note reviewed.  Constitutional:      General: He is not in acute distress.    Appearance: Normal appearance. He is normal weight.  He is not ill-appearing, toxic-appearing or diaphoretic.  HENT:     Head: Normocephalic and atraumatic.     Right Ear: External ear normal.     Nose: Nose normal.     Mouth/Throat:     Mouth: Mucous membranes are moist.     Pharynx: Oropharynx is clear. No oropharyngeal exudate or posterior oropharyngeal erythema.     Comments: No perioral, intraoral, lingual trauma appreciated. Eyes:     Comments: Right pupil fixed and dilated.  Left pupil mildly responsive to light.  Patient is legally blind.  Cardiovascular:     Rate and Rhythm: Normal rate and regular rhythm.     Pulses: Normal pulses.     Heart sounds: Normal heart sounds. No murmur. No friction rub. No gallop.   Pulmonary:     Effort: Pulmonary effort is normal. No respiratory distress.     Breath sounds: Normal breath sounds. No stridor. No wheezing, rhonchi or rales.     Comments: Lungs clear to auscultation bilaterally in the posterior lung fields. Abdominal:     General: Abdomen is flat.     Palpations: Abdomen is soft.     Tenderness: There is no abdominal tenderness.  Musculoskeletal:        General: Normal range of motion.     Cervical back: Normal range of motion. No tenderness.  Skin:    General: Skin is warm and dry.     Capillary Refill: Capillary refill takes 2 to 3 seconds.  Neurological:      General: No focal deficit present.     Mental Status: He is alert and oriented to person, place, and time.  Psychiatric:        Mood and Affect: Mood normal.        Behavior: Behavior normal.    ED Results / Procedures / Treatments   Labs (all labs ordered are listed, but only abnormal results are displayed) Labs Reviewed  BASIC METABOLIC PANEL - Abnormal; Notable for the following components:      Result Value   Sodium 134 (*)    Potassium 3.4 (*)    Chloride 95 (*)    Glucose, Bld 222 (*)    Creatinine, Ser 4.39 (*)    Calcium 8.4 (*)    GFR calc non Af Amer 14 (*)    GFR calc Af Amer 16 (*)    All other components within normal limits  CBC - Abnormal; Notable for the following components:   RBC 4.06 (*)    Hemoglobin 12.5 (*)    HCT 38.7 (*)    All other components within normal limits  CBG MONITORING, ED - Abnormal; Notable for the following components:   Glucose-Capillary 183 (*)    All other components within normal limits  URINALYSIS, ROUTINE W REFLEX MICROSCOPIC    EKG EKG Interpretation  Date/Time:  Wednesday October 07 2019 12:07:39 EDT Ventricular Rate:  61 PR Interval:    QRS Duration: 103 QT Interval:  474 QTC Calculation: 478 R Axis:   32 Text Interpretation: Sinus rhythm Borderline T wave abnormalities Borderline prolonged QT interval since 05/25/2019, QT is shorter Confirmed by Daleen Bo [phone]) on 10/07/2019 1:25:42 PM  Radiology No results found.  Procedures Procedures   Medications Ordered in ED Medications  sodium chloride flush (NS) 0.9 % injection 3 mL (has no administration in time range)  sodium chloride 0.9 % bolus 250 mL (0 mLs Intravenous Stopped 10/07/19 1433)   ED Course  I have reviewed  the triage vital signs and the nursing notes.  Pertinent labs & imaging results that were available during my care of the patient were reviewed by me and considered in my medical decision making (see chart for details).    MDM  Rules/Calculators/A&P                      12:35 PM patient presents today due to a syncopal episode status post hemodialysis.  He was initially evaluated by EMS and was given IVF.  Patient denies any current symptoms at this time.  Will obtain basic labs, orthostatics and will have patient eat and drink.  We will closely monitor.  1:59 PM nursing staff performed orthostatic vital signs showing lying values at 142/64 and standing values at 117/57.  Will give additional IVF, will reassess and if feeling better will discharge home.  2:05 PM patient was discussed with and evaluated by my coworker Benedetto Goad, PA-C.  He states he is feeling better and is ready to go home.  Strict return precautions given.  His questions were answered and he verbalized understanding.  Vital signs stable at the time of discharge.  Patient discharged to home/self care.  Condition at discharge: Stable  Note: Portions of this report may have been transcribed using voice recognition software. Every effort was made to ensure accuracy; however, inadvertent computerized transcription errors may be present.     Final Clinical Impression(s) / ED Diagnoses Final diagnoses:  Syncope, unspecified syncope type    Rx / DC Orders ED Discharge Orders    None       Rayna Sexton, PA-C 10/07/19 1436    Daleen Bo, MD 10/08/19 [phone]

## 2019-10-07 NOTE — ED Notes (Signed)
Pt states he no longer makes urine 

## 2019-10-08 DIAGNOSIS — N186 End stage renal disease: Secondary | ICD-10-CM | POA: Diagnosis not present

## 2019-10-08 DIAGNOSIS — Z992 Dependence on renal dialysis: Secondary | ICD-10-CM | POA: Diagnosis not present

## 2019-10-08 DIAGNOSIS — I120 Hypertensive chronic kidney disease with stage 5 chronic kidney disease or end stage renal disease: Secondary | ICD-10-CM | POA: Diagnosis not present

## 2019-10-08 NOTE — ED Provider Notes (Signed)
  Face-to-face evaluation   History: He presents for evaluation of syncope which occurred, on the bus after dialysis today.  Treated on scene by EMS found to be hypotensive with IV fluids.  Blood pressure improved during transport.  Physical exam: Alert elderly man, who appears chronically ill.  No respiratory distress.  No dysarthria or aphasia.  Moves extremities equally.  No peripheral edema.  Medical screening examination/treatment/procedure(s) were conducted as a shared visit with non-physician practitioner(s) and myself.  I personally evaluated the patient during the encounter    Daleen Bo, MD 10/08/19 [phone]

## 2019-10-09 DIAGNOSIS — E119 Type 2 diabetes mellitus without complications: Secondary | ICD-10-CM | POA: Diagnosis not present

## 2019-10-09 DIAGNOSIS — E876 Hypokalemia: Secondary | ICD-10-CM | POA: Diagnosis not present

## 2019-10-09 DIAGNOSIS — D631 Anemia in chronic kidney disease: Secondary | ICD-10-CM | POA: Diagnosis not present

## 2019-10-09 DIAGNOSIS — Z992 Dependence on renal dialysis: Secondary | ICD-10-CM | POA: Diagnosis not present

## 2019-10-09 DIAGNOSIS — D509 Iron deficiency anemia, unspecified: Secondary | ICD-10-CM | POA: Diagnosis not present

## 2019-10-09 DIAGNOSIS — N186 End stage renal disease: Secondary | ICD-10-CM | POA: Diagnosis not present

## 2019-10-09 DIAGNOSIS — N2581 Secondary hyperparathyroidism of renal origin: Secondary | ICD-10-CM | POA: Diagnosis not present

## 2019-10-12 DIAGNOSIS — N186 End stage renal disease: Secondary | ICD-10-CM | POA: Diagnosis not present

## 2019-10-12 DIAGNOSIS — D631 Anemia in chronic kidney disease: Secondary | ICD-10-CM | POA: Diagnosis not present

## 2019-10-12 DIAGNOSIS — N2581 Secondary hyperparathyroidism of renal origin: Secondary | ICD-10-CM | POA: Diagnosis not present

## 2019-10-12 DIAGNOSIS — Z992 Dependence on renal dialysis: Secondary | ICD-10-CM | POA: Diagnosis not present

## 2019-10-12 DIAGNOSIS — D509 Iron deficiency anemia, unspecified: Secondary | ICD-10-CM | POA: Diagnosis not present

## 2019-10-12 DIAGNOSIS — E876 Hypokalemia: Secondary | ICD-10-CM | POA: Diagnosis not present

## 2019-10-14 DIAGNOSIS — D631 Anemia in chronic kidney disease: Secondary | ICD-10-CM | POA: Diagnosis not present

## 2019-10-14 DIAGNOSIS — E876 Hypokalemia: Secondary | ICD-10-CM | POA: Diagnosis not present

## 2019-10-14 DIAGNOSIS — N2581 Secondary hyperparathyroidism of renal origin: Secondary | ICD-10-CM | POA: Diagnosis not present

## 2019-10-14 DIAGNOSIS — Z992 Dependence on renal dialysis: Secondary | ICD-10-CM | POA: Diagnosis not present

## 2019-10-14 DIAGNOSIS — N186 End stage renal disease: Secondary | ICD-10-CM | POA: Diagnosis not present

## 2019-10-14 DIAGNOSIS — D509 Iron deficiency anemia, unspecified: Secondary | ICD-10-CM | POA: Diagnosis not present

## 2019-10-16 DIAGNOSIS — N2581 Secondary hyperparathyroidism of renal origin: Secondary | ICD-10-CM | POA: Diagnosis not present

## 2019-10-16 DIAGNOSIS — N186 End stage renal disease: Secondary | ICD-10-CM | POA: Diagnosis not present

## 2019-10-16 DIAGNOSIS — E876 Hypokalemia: Secondary | ICD-10-CM | POA: Diagnosis not present

## 2019-10-16 DIAGNOSIS — D631 Anemia in chronic kidney disease: Secondary | ICD-10-CM | POA: Diagnosis not present

## 2019-10-16 DIAGNOSIS — D509 Iron deficiency anemia, unspecified: Secondary | ICD-10-CM | POA: Diagnosis not present

## 2019-10-16 DIAGNOSIS — Z992 Dependence on renal dialysis: Secondary | ICD-10-CM | POA: Diagnosis not present

## 2019-10-19 DIAGNOSIS — E876 Hypokalemia: Secondary | ICD-10-CM | POA: Diagnosis not present

## 2019-10-19 DIAGNOSIS — N2581 Secondary hyperparathyroidism of renal origin: Secondary | ICD-10-CM | POA: Diagnosis not present

## 2019-10-19 DIAGNOSIS — D631 Anemia in chronic kidney disease: Secondary | ICD-10-CM | POA: Diagnosis not present

## 2019-10-19 DIAGNOSIS — Z992 Dependence on renal dialysis: Secondary | ICD-10-CM | POA: Diagnosis not present

## 2019-10-19 DIAGNOSIS — D509 Iron deficiency anemia, unspecified: Secondary | ICD-10-CM | POA: Diagnosis not present

## 2019-10-19 DIAGNOSIS — N186 End stage renal disease: Secondary | ICD-10-CM | POA: Diagnosis not present

## 2019-10-21 DIAGNOSIS — D509 Iron deficiency anemia, unspecified: Secondary | ICD-10-CM | POA: Diagnosis not present

## 2019-10-21 DIAGNOSIS — Z992 Dependence on renal dialysis: Secondary | ICD-10-CM | POA: Diagnosis not present

## 2019-10-21 DIAGNOSIS — D631 Anemia in chronic kidney disease: Secondary | ICD-10-CM | POA: Diagnosis not present

## 2019-10-21 DIAGNOSIS — N2581 Secondary hyperparathyroidism of renal origin: Secondary | ICD-10-CM | POA: Diagnosis not present

## 2019-10-21 DIAGNOSIS — E876 Hypokalemia: Secondary | ICD-10-CM | POA: Diagnosis not present

## 2019-10-21 DIAGNOSIS — N186 End stage renal disease: Secondary | ICD-10-CM | POA: Diagnosis not present

## 2019-10-22 DIAGNOSIS — N2581 Secondary hyperparathyroidism of renal origin: Secondary | ICD-10-CM | POA: Diagnosis not present

## 2019-10-22 DIAGNOSIS — Z992 Dependence on renal dialysis: Secondary | ICD-10-CM | POA: Diagnosis not present

## 2019-10-22 DIAGNOSIS — N186 End stage renal disease: Secondary | ICD-10-CM | POA: Diagnosis not present

## 2019-10-22 DIAGNOSIS — E876 Hypokalemia: Secondary | ICD-10-CM | POA: Diagnosis not present

## 2019-10-22 DIAGNOSIS — D509 Iron deficiency anemia, unspecified: Secondary | ICD-10-CM | POA: Diagnosis not present

## 2019-10-22 DIAGNOSIS — D631 Anemia in chronic kidney disease: Secondary | ICD-10-CM | POA: Diagnosis not present

## 2019-10-23 DIAGNOSIS — Z23 Encounter for immunization: Secondary | ICD-10-CM | POA: Diagnosis not present

## 2019-10-26 DIAGNOSIS — N2581 Secondary hyperparathyroidism of renal origin: Secondary | ICD-10-CM | POA: Diagnosis not present

## 2019-10-26 DIAGNOSIS — D509 Iron deficiency anemia, unspecified: Secondary | ICD-10-CM | POA: Diagnosis not present

## 2019-10-26 DIAGNOSIS — D631 Anemia in chronic kidney disease: Secondary | ICD-10-CM | POA: Diagnosis not present

## 2019-10-26 DIAGNOSIS — E876 Hypokalemia: Secondary | ICD-10-CM | POA: Diagnosis not present

## 2019-10-26 DIAGNOSIS — N186 End stage renal disease: Secondary | ICD-10-CM | POA: Diagnosis not present

## 2019-10-26 DIAGNOSIS — Z992 Dependence on renal dialysis: Secondary | ICD-10-CM | POA: Diagnosis not present

## 2019-10-28 DIAGNOSIS — E876 Hypokalemia: Secondary | ICD-10-CM | POA: Diagnosis not present

## 2019-10-28 DIAGNOSIS — D631 Anemia in chronic kidney disease: Secondary | ICD-10-CM | POA: Diagnosis not present

## 2019-10-28 DIAGNOSIS — D509 Iron deficiency anemia, unspecified: Secondary | ICD-10-CM | POA: Diagnosis not present

## 2019-10-28 DIAGNOSIS — N2581 Secondary hyperparathyroidism of renal origin: Secondary | ICD-10-CM | POA: Diagnosis not present

## 2019-10-28 DIAGNOSIS — E1129 Type 2 diabetes mellitus with other diabetic kidney complication: Secondary | ICD-10-CM | POA: Diagnosis not present

## 2019-10-28 DIAGNOSIS — Z992 Dependence on renal dialysis: Secondary | ICD-10-CM | POA: Diagnosis not present

## 2019-10-28 DIAGNOSIS — N186 End stage renal disease: Secondary | ICD-10-CM | POA: Diagnosis not present

## 2019-10-30 DIAGNOSIS — Z992 Dependence on renal dialysis: Secondary | ICD-10-CM | POA: Diagnosis not present

## 2019-10-30 DIAGNOSIS — D631 Anemia in chronic kidney disease: Secondary | ICD-10-CM | POA: Diagnosis not present

## 2019-10-30 DIAGNOSIS — E876 Hypokalemia: Secondary | ICD-10-CM | POA: Diagnosis not present

## 2019-10-30 DIAGNOSIS — N186 End stage renal disease: Secondary | ICD-10-CM | POA: Diagnosis not present

## 2019-10-30 DIAGNOSIS — D509 Iron deficiency anemia, unspecified: Secondary | ICD-10-CM | POA: Diagnosis not present

## 2019-10-30 DIAGNOSIS — N2581 Secondary hyperparathyroidism of renal origin: Secondary | ICD-10-CM | POA: Diagnosis not present

## 2019-11-02 DIAGNOSIS — D631 Anemia in chronic kidney disease: Secondary | ICD-10-CM | POA: Diagnosis not present

## 2019-11-02 DIAGNOSIS — D509 Iron deficiency anemia, unspecified: Secondary | ICD-10-CM | POA: Diagnosis not present

## 2019-11-02 DIAGNOSIS — N2581 Secondary hyperparathyroidism of renal origin: Secondary | ICD-10-CM | POA: Diagnosis not present

## 2019-11-02 DIAGNOSIS — E876 Hypokalemia: Secondary | ICD-10-CM | POA: Diagnosis not present

## 2019-11-02 DIAGNOSIS — N186 End stage renal disease: Secondary | ICD-10-CM | POA: Diagnosis not present

## 2019-11-02 DIAGNOSIS — Z992 Dependence on renal dialysis: Secondary | ICD-10-CM | POA: Diagnosis not present

## 2019-11-04 DIAGNOSIS — E876 Hypokalemia: Secondary | ICD-10-CM | POA: Diagnosis not present

## 2019-11-04 DIAGNOSIS — Z992 Dependence on renal dialysis: Secondary | ICD-10-CM | POA: Diagnosis not present

## 2019-11-04 DIAGNOSIS — N186 End stage renal disease: Secondary | ICD-10-CM | POA: Diagnosis not present

## 2019-11-04 DIAGNOSIS — N2581 Secondary hyperparathyroidism of renal origin: Secondary | ICD-10-CM | POA: Diagnosis not present

## 2019-11-04 DIAGNOSIS — D509 Iron deficiency anemia, unspecified: Secondary | ICD-10-CM | POA: Diagnosis not present

## 2019-11-04 DIAGNOSIS — D631 Anemia in chronic kidney disease: Secondary | ICD-10-CM | POA: Diagnosis not present

## 2019-11-06 DIAGNOSIS — N2581 Secondary hyperparathyroidism of renal origin: Secondary | ICD-10-CM | POA: Diagnosis not present

## 2019-11-06 DIAGNOSIS — D631 Anemia in chronic kidney disease: Secondary | ICD-10-CM | POA: Diagnosis not present

## 2019-11-06 DIAGNOSIS — Z992 Dependence on renal dialysis: Secondary | ICD-10-CM | POA: Diagnosis not present

## 2019-11-06 DIAGNOSIS — D509 Iron deficiency anemia, unspecified: Secondary | ICD-10-CM | POA: Diagnosis not present

## 2019-11-06 DIAGNOSIS — E876 Hypokalemia: Secondary | ICD-10-CM | POA: Diagnosis not present

## 2019-11-06 DIAGNOSIS — N186 End stage renal disease: Secondary | ICD-10-CM | POA: Diagnosis not present

## 2019-11-07 DIAGNOSIS — Z992 Dependence on renal dialysis: Secondary | ICD-10-CM | POA: Diagnosis not present

## 2019-11-07 DIAGNOSIS — N186 End stage renal disease: Secondary | ICD-10-CM | POA: Diagnosis not present

## 2019-11-07 DIAGNOSIS — I120 Hypertensive chronic kidney disease with stage 5 chronic kidney disease or end stage renal disease: Secondary | ICD-10-CM | POA: Diagnosis not present

## 2019-11-09 DIAGNOSIS — D631 Anemia in chronic kidney disease: Secondary | ICD-10-CM | POA: Diagnosis not present

## 2019-11-09 DIAGNOSIS — E876 Hypokalemia: Secondary | ICD-10-CM | POA: Diagnosis not present

## 2019-11-09 DIAGNOSIS — N186 End stage renal disease: Secondary | ICD-10-CM | POA: Diagnosis not present

## 2019-11-09 DIAGNOSIS — Z992 Dependence on renal dialysis: Secondary | ICD-10-CM | POA: Diagnosis not present

## 2019-11-09 DIAGNOSIS — E119 Type 2 diabetes mellitus without complications: Secondary | ICD-10-CM | POA: Diagnosis not present

## 2019-11-09 DIAGNOSIS — D509 Iron deficiency anemia, unspecified: Secondary | ICD-10-CM | POA: Diagnosis not present

## 2019-11-09 DIAGNOSIS — N2581 Secondary hyperparathyroidism of renal origin: Secondary | ICD-10-CM | POA: Diagnosis not present

## 2019-11-10 DIAGNOSIS — H547 Unspecified visual loss: Secondary | ICD-10-CM | POA: Diagnosis not present

## 2019-11-10 DIAGNOSIS — E559 Vitamin D deficiency, unspecified: Secondary | ICD-10-CM | POA: Diagnosis not present

## 2019-11-10 DIAGNOSIS — L299 Pruritus, unspecified: Secondary | ICD-10-CM | POA: Diagnosis not present

## 2019-11-10 DIAGNOSIS — I999 Unspecified disorder of circulatory system: Secondary | ICD-10-CM | POA: Diagnosis not present

## 2019-11-10 DIAGNOSIS — E7800 Pure hypercholesterolemia, unspecified: Secondary | ICD-10-CM | POA: Diagnosis not present

## 2019-11-10 DIAGNOSIS — I6990 Unspecified sequelae of unspecified cerebrovascular disease: Secondary | ICD-10-CM | POA: Diagnosis not present

## 2019-11-10 DIAGNOSIS — R55 Syncope and collapse: Secondary | ICD-10-CM | POA: Diagnosis not present

## 2019-11-10 DIAGNOSIS — E1165 Type 2 diabetes mellitus with hyperglycemia: Secondary | ICD-10-CM | POA: Diagnosis not present

## 2019-11-10 DIAGNOSIS — J940 Chylous effusion: Secondary | ICD-10-CM | POA: Diagnosis not present

## 2019-11-10 DIAGNOSIS — N186 End stage renal disease: Secondary | ICD-10-CM | POA: Diagnosis not present

## 2019-11-10 DIAGNOSIS — I119 Hypertensive heart disease without heart failure: Secondary | ICD-10-CM | POA: Diagnosis not present

## 2019-11-11 DIAGNOSIS — D631 Anemia in chronic kidney disease: Secondary | ICD-10-CM | POA: Diagnosis not present

## 2019-11-11 DIAGNOSIS — Z992 Dependence on renal dialysis: Secondary | ICD-10-CM | POA: Diagnosis not present

## 2019-11-11 DIAGNOSIS — E876 Hypokalemia: Secondary | ICD-10-CM | POA: Diagnosis not present

## 2019-11-11 DIAGNOSIS — N2581 Secondary hyperparathyroidism of renal origin: Secondary | ICD-10-CM | POA: Diagnosis not present

## 2019-11-11 DIAGNOSIS — N186 End stage renal disease: Secondary | ICD-10-CM | POA: Diagnosis not present

## 2019-11-11 DIAGNOSIS — E119 Type 2 diabetes mellitus without complications: Secondary | ICD-10-CM | POA: Diagnosis not present

## 2019-11-12 DIAGNOSIS — H401133 Primary open-angle glaucoma, bilateral, severe stage: Secondary | ICD-10-CM | POA: Diagnosis not present

## 2019-11-13 DIAGNOSIS — E119 Type 2 diabetes mellitus without complications: Secondary | ICD-10-CM | POA: Diagnosis not present

## 2019-11-13 DIAGNOSIS — D631 Anemia in chronic kidney disease: Secondary | ICD-10-CM | POA: Diagnosis not present

## 2019-11-13 DIAGNOSIS — N2581 Secondary hyperparathyroidism of renal origin: Secondary | ICD-10-CM | POA: Diagnosis not present

## 2019-11-13 DIAGNOSIS — E876 Hypokalemia: Secondary | ICD-10-CM | POA: Diagnosis not present

## 2019-11-13 DIAGNOSIS — N186 End stage renal disease: Secondary | ICD-10-CM | POA: Diagnosis not present

## 2019-11-13 DIAGNOSIS — Z992 Dependence on renal dialysis: Secondary | ICD-10-CM | POA: Diagnosis not present

## 2019-11-16 DIAGNOSIS — D631 Anemia in chronic kidney disease: Secondary | ICD-10-CM | POA: Diagnosis not present

## 2019-11-16 DIAGNOSIS — N2581 Secondary hyperparathyroidism of renal origin: Secondary | ICD-10-CM | POA: Diagnosis not present

## 2019-11-16 DIAGNOSIS — E119 Type 2 diabetes mellitus without complications: Secondary | ICD-10-CM | POA: Diagnosis not present

## 2019-11-16 DIAGNOSIS — Z992 Dependence on renal dialysis: Secondary | ICD-10-CM | POA: Diagnosis not present

## 2019-11-16 DIAGNOSIS — E876 Hypokalemia: Secondary | ICD-10-CM | POA: Diagnosis not present

## 2019-11-16 DIAGNOSIS — N186 End stage renal disease: Secondary | ICD-10-CM | POA: Diagnosis not present

## 2019-11-18 DIAGNOSIS — N186 End stage renal disease: Secondary | ICD-10-CM | POA: Diagnosis not present

## 2019-11-18 DIAGNOSIS — E876 Hypokalemia: Secondary | ICD-10-CM | POA: Diagnosis not present

## 2019-11-18 DIAGNOSIS — E119 Type 2 diabetes mellitus without complications: Secondary | ICD-10-CM | POA: Diagnosis not present

## 2019-11-18 DIAGNOSIS — N2581 Secondary hyperparathyroidism of renal origin: Secondary | ICD-10-CM | POA: Diagnosis not present

## 2019-11-18 DIAGNOSIS — Z992 Dependence on renal dialysis: Secondary | ICD-10-CM | POA: Diagnosis not present

## 2019-11-18 DIAGNOSIS — D631 Anemia in chronic kidney disease: Secondary | ICD-10-CM | POA: Diagnosis not present

## 2019-11-20 DIAGNOSIS — E876 Hypokalemia: Secondary | ICD-10-CM | POA: Diagnosis not present

## 2019-11-20 DIAGNOSIS — E119 Type 2 diabetes mellitus without complications: Secondary | ICD-10-CM | POA: Diagnosis not present

## 2019-11-20 DIAGNOSIS — Z992 Dependence on renal dialysis: Secondary | ICD-10-CM | POA: Diagnosis not present

## 2019-11-20 DIAGNOSIS — N2581 Secondary hyperparathyroidism of renal origin: Secondary | ICD-10-CM | POA: Diagnosis not present

## 2019-11-20 DIAGNOSIS — D631 Anemia in chronic kidney disease: Secondary | ICD-10-CM | POA: Diagnosis not present

## 2019-11-20 DIAGNOSIS — N186 End stage renal disease: Secondary | ICD-10-CM | POA: Diagnosis not present

## 2019-11-23 DIAGNOSIS — N186 End stage renal disease: Secondary | ICD-10-CM | POA: Diagnosis not present

## 2019-11-23 DIAGNOSIS — E876 Hypokalemia: Secondary | ICD-10-CM | POA: Diagnosis not present

## 2019-11-23 DIAGNOSIS — E119 Type 2 diabetes mellitus without complications: Secondary | ICD-10-CM | POA: Diagnosis not present

## 2019-11-23 DIAGNOSIS — D631 Anemia in chronic kidney disease: Secondary | ICD-10-CM | POA: Diagnosis not present

## 2019-11-23 DIAGNOSIS — Z992 Dependence on renal dialysis: Secondary | ICD-10-CM | POA: Diagnosis not present

## 2019-11-23 DIAGNOSIS — N2581 Secondary hyperparathyroidism of renal origin: Secondary | ICD-10-CM | POA: Diagnosis not present

## 2019-11-25 DIAGNOSIS — N2581 Secondary hyperparathyroidism of renal origin: Secondary | ICD-10-CM | POA: Diagnosis not present

## 2019-11-25 DIAGNOSIS — N186 End stage renal disease: Secondary | ICD-10-CM | POA: Diagnosis not present

## 2019-11-25 DIAGNOSIS — E119 Type 2 diabetes mellitus without complications: Secondary | ICD-10-CM | POA: Diagnosis not present

## 2019-11-25 DIAGNOSIS — Z992 Dependence on renal dialysis: Secondary | ICD-10-CM | POA: Diagnosis not present

## 2019-11-25 DIAGNOSIS — D631 Anemia in chronic kidney disease: Secondary | ICD-10-CM | POA: Diagnosis not present

## 2019-11-25 DIAGNOSIS — E876 Hypokalemia: Secondary | ICD-10-CM | POA: Diagnosis not present

## 2019-11-26 DIAGNOSIS — Z23 Encounter for immunization: Secondary | ICD-10-CM | POA: Diagnosis not present

## 2019-11-27 DIAGNOSIS — N186 End stage renal disease: Secondary | ICD-10-CM | POA: Diagnosis not present

## 2019-11-27 DIAGNOSIS — Z992 Dependence on renal dialysis: Secondary | ICD-10-CM | POA: Diagnosis not present

## 2019-11-27 DIAGNOSIS — D631 Anemia in chronic kidney disease: Secondary | ICD-10-CM | POA: Diagnosis not present

## 2019-11-27 DIAGNOSIS — N2581 Secondary hyperparathyroidism of renal origin: Secondary | ICD-10-CM | POA: Diagnosis not present

## 2019-11-27 DIAGNOSIS — E876 Hypokalemia: Secondary | ICD-10-CM | POA: Diagnosis not present

## 2019-11-27 DIAGNOSIS — E119 Type 2 diabetes mellitus without complications: Secondary | ICD-10-CM | POA: Diagnosis not present

## 2019-11-30 DIAGNOSIS — Z992 Dependence on renal dialysis: Secondary | ICD-10-CM | POA: Diagnosis not present

## 2019-11-30 DIAGNOSIS — N186 End stage renal disease: Secondary | ICD-10-CM | POA: Diagnosis not present

## 2019-11-30 DIAGNOSIS — D631 Anemia in chronic kidney disease: Secondary | ICD-10-CM | POA: Diagnosis not present

## 2019-11-30 DIAGNOSIS — E119 Type 2 diabetes mellitus without complications: Secondary | ICD-10-CM | POA: Diagnosis not present

## 2019-11-30 DIAGNOSIS — E876 Hypokalemia: Secondary | ICD-10-CM | POA: Diagnosis not present

## 2019-11-30 DIAGNOSIS — N2581 Secondary hyperparathyroidism of renal origin: Secondary | ICD-10-CM | POA: Diagnosis not present

## 2019-12-02 DIAGNOSIS — N2581 Secondary hyperparathyroidism of renal origin: Secondary | ICD-10-CM | POA: Diagnosis not present

## 2019-12-02 DIAGNOSIS — Z992 Dependence on renal dialysis: Secondary | ICD-10-CM | POA: Diagnosis not present

## 2019-12-02 DIAGNOSIS — D631 Anemia in chronic kidney disease: Secondary | ICD-10-CM | POA: Diagnosis not present

## 2019-12-02 DIAGNOSIS — E119 Type 2 diabetes mellitus without complications: Secondary | ICD-10-CM | POA: Diagnosis not present

## 2019-12-02 DIAGNOSIS — N186 End stage renal disease: Secondary | ICD-10-CM | POA: Diagnosis not present

## 2019-12-02 DIAGNOSIS — E876 Hypokalemia: Secondary | ICD-10-CM | POA: Diagnosis not present

## 2019-12-02 NOTE — Progress Notes (Signed)
Virtual Visit via Telephone Note   This visit type was conducted due to national recommendations for restrictions regarding the COVID-19 Pandemic (e.g. social distancing) in an effort to limit this patient's exposure and mitigate transmission in our community.  Due to his co-morbid illnesses, this patient is at least at moderate risk for complications without adequate follow up.  This format is felt to be most appropriate for this patient at this time.  The patient did not have access to video technology/had technical difficulties with video requiring transitioning to audio format only (telephone).  All issues noted in this document were discussed and addressed.  No physical exam could be performed with this format.  Please refer to the patient's chart for his  consent to telehealth for [facility].   Date:  12/03/2019   ID:  Shawn [name], DOB 08/18/1960, MRN [id number]  Patient Location: Home Provider Location: Home  PCP:  Vincente Liberty, MD  Cardiologist:  Buford Dresser, MD  Electrophysiologist:  None   Evaluation Performed:  Follow-Up Visit  Chief Complaint:  Follow up  History of Present Illness:    Shawn [name] is a 59 y.o. male with we are following for ongoing assessment and management of hypertension, with history of CVA, diabetes, end-stage renal disease on dialysis, and legal blindness.  Was last seen virtually by Dr. Harrell Gave on 05/05/2019 at which time he was without cardiac complaints.  He did complain of difficulty sleeping and was working with his PCP about that.  He was seen on consultation by Dr. Marlou Porch on 05/25/2019, at the request of Dr. Kathrynn Humble for syncope and possible bradycardia.  EKG was reviewed by Dr. Marlou Porch which demonstrated sinus bradycardia with first-degree AV block with a heart rate of 58 bpm.  He had had an external pacemaker placed due to difficulty palpating pulses prior to being seen by cardiology.  Dr. Marlou Porch  discontinued metoprolol 25 mg twice daily and he was to follow-up to see how he was feeling regarding palpitations and blood pressure control.  The patient had had a history of hypotension in the past, and therefore he was to be followed closely concerning blood pressure response to discontinuation of beta-blocker.  It was not entirely clear why the patient had a syncopal episode.  It was postulated that it was related to transient hypotension.  He was seen last via virtual visit on 06/26/2019 by myself.  At that visit he was not on any antihypertensives.  He only occasionally took metoprolol 12.5 mg as needed for elevated heart rate.  He denied any new symptoms.  Labs were completed by PCP and nephrology.  Unfortunately the patient was again seen in the ED on 10/07/2019 for syncopal episode.  He was found to be hypotensive and was placed on IV hydration.  Has not had any further episodes on this visit. He has been taken off of insulin in November 2020. He reports that his Hgb A1c is elevated again 9.0. He is contacting his PCP about this.He has not had a need to take any prn metoprolol. He is watching his fluid intake. Dialysis is keeping his volume controlled. He has no new complaints today.   The patient does not  have symptoms concerning for COVID-19 infection (fever, chills, cough, or new shortness of breath). He has had both vaccines for COVID protection.    Past Medical History:  Diagnosis Date  . Anemia   . Arthritis    patient denies  . Blind right eye   . Chronic  kidney disease    Dialysis since 2011 MWF  . Complication of anesthesia   . Diabetes mellitus    type II  . Hypertension   . Legally blind   . No pertinent past medical history   . PONV (postoperative nausea and vomiting)    N/V- became dehydrated had to have IV fluids- 01/2015  . Shortness of breath dyspnea    when has too fluid  . Stroke [facility]) 2012   date per patient  . Syncope   . Unspecified cerebral artery  occlusion with cerebral infarction 04/02/2013   Past Surgical History:  Procedure Laterality Date  . AV FISTULA PLACEMENT Right 05/12/2013   Procedure: INSERTION OF ARTERIOVENOUS (AV) GORE-TEX GRAFT ARM; ULTRASOUND GUIDED;  Surgeon: Conrad Lakin, MD;  Location: [place];  Service: Vascular;  Laterality: Right;  . AV FISTULA PLACEMENT Left 01/18/2015   Procedure: INSERTION OF ARTERIOVENOUS GORE-TEX GRAFT LEFT UPPER ARM;  Surgeon: Conrad Philo, MD;  Location: [place];  Service: Vascular;  Laterality: Left;  . COLONOSCOPY  06/2012  . EYE SURGERY Bilateral    retina surgery both eyes, cataract surgery both eyes  . HERNIA REPAIR     right inguinal  . INSERTION OF DIALYSIS CATHETER Right   . left arm graft  10/2010  . LIGATION ARTERIOVENOUS GORTEX GRAFT Left 05/03/2015   Procedure: LIGATION ARTERIOVENOUS GORTEX GRAFT-LEFT UPPER ARM;  Surgeon: Conrad , MD;  Location: [place];  Service: Vascular;  Laterality: Left;     Current Meds  Medication Sig  . aspirin EC 325 MG tablet Take 325 mg by mouth at bedtime.  Marland Kitchen atorvastatin (LIPITOR) 40 MG tablet Take 1 tablet by mouth once daily  . b complex vitamins tablet Take 1 tablet by mouth at bedtime.  . brimonidine (ALPHAGAN) 0.2 % ophthalmic solution Place 1 drop into both eyes 2 (two) times daily.   . calcitRIOL (ROCALTROL) 0.5 MCG capsule Take 1 capsule (0.5 mcg total) by mouth 3 (three) times a week.  . diphenhydramine-acetaminophen (TYLENOL PM) 25-500 MG TABS tablet Take 1 tablet by mouth at bedtime as needed (pain).  . dorzolamide-timolol (COSOPT) 22.3-6.8 MG/ML ophthalmic solution Place 1 drop into both eyes 2 (two) times daily.   . hydrOXYzine (ATARAX/VISTARIL) 10 MG tablet Take 10 mg by mouth daily as needed for itching.   Marland Kitchen lanthanum (FOSRENOL) 1000 MG chewable tablet Chew 1,000 mg by mouth 3 (three) times daily with meals.  . latanoprost (XALATAN) 0.005 % ophthalmic solution Place 1 drop into both eyes at bedtime.   . metoprolol tartrate  (LOPRESSOR) 25 MG tablet Take 25 mg by mouth 2 (two) times daily as needed (if BP increases).      Allergies:   Patient has no known allergies.   Social History   Tobacco Use  . Smoking status: Never Smoker  . Smokeless tobacco: Never Used  Substance Use Topics  . Alcohol use: No    Alcohol/week: 0.0 standard drinks  . Drug use: No     Family Hx: The patient's family history includes Alzheimer's disease in his mother; Cancer in his father and sister; Diabetes in his mother; Heart disease in his father; Stroke in his sister.  ROS:   Please see the history of present illness.    All other systems reviewed and are negative.   Prior CV studies:   The following studies were reviewed today: Left ventricle: The cavity size was normal. There was mild  concentric hypertrophy. Systolic function was mildly reduced.  The  estimated ejection fraction was in the range of 45% to 50%. Mild  diffuse hypokinesis. Doppler parameters are consistent with  abnormal left ventricular relaxation (grade 1 diastolic  dysfunction). Doppler parameters are consistent with high  ventricular filling pressure.  - Aortic valve: Transvalvular velocity was within the normal range.  There was no stenosis. There was no regurgitation. Valve area  (VTI): 2.17 cm^2. Valve area (Vmax): 2.16 cm^2. Valve area  (Vmean): 2.14 cm^2.  - Mitral valve: Transvalvular velocity was within the normal range.  There was no evidence for stenosis. There was trivial  regurgitation.  - Left atrium: The atrium was mildly dilated.  - Right ventricle: The cavity size was normal. Wall thickness was  normal. Systolic function was normal.  - Tricuspid valve: There was trivial regurgitation.  - Pulmonary arteries: Systolic pressure was within the normal  range. PA peak pressure: 35 mm Hg (S).   Labs/Other Tests and Data Reviewed:    EKG:  No ECG reviewed.  Recent Labs: 05/25/2019: ALT 15 05/31/2019:  Magnesium 2.4 10/07/2019: BUN 18; Creatinine, Ser 4.39; Hemoglobin 12.5; Platelets 280; Potassium 3.4; Sodium 134   Recent Lipid Panel Lab Results  Component Value Date/Time   CHOL (H) 01/26/2010 05:20 AM    207        ATP III CLASSIFICATION:  <200     mg/dL   Desirable  200-239  mg/dL   Borderline High  >=240    mg/dL   High          TRIG 115 01/26/2010 05:20 AM   HDL 39 (L) 01/26/2010 05:20 AM   CHOLHDL 5.3 01/26/2010 05:20 AM   LDLCALC (H) 01/26/2010 05:20 AM    145        Total Cholesterol/HDL:CHD Risk Coronary Heart Disease Risk Table                     Men   Women  1/2 Average Risk   3.4   3.3  Average Risk       5.0   4.4  2 X Average Risk   9.6   7.1  3 X Average Risk  23.4   11.0        Use the calculated Patient Ratio above and the CHD Risk Table to determine the patient's CHD Risk.        ATP III CLASSIFICATION (LDL):  <100     mg/dL   Optimal  100-129  mg/dL   Near or Above                    Optimal  130-159  mg/dL   Borderline  160-189  mg/dL   High  >190     mg/dL   Very High    Wt Readings from Last 3 Encounters:  12/03/19 160 lb (72.6 kg)  10/07/19 160 lb (72.6 kg)  08/25/19 164 lb (74.4 kg)     Objective:    Vital Signs:  BP (!) 112/57   Ht 5\' 11"  (1.803 m)   Wt 160 lb (72.6 kg)   BMI 22.32 kg/m    VITAL SIGNS:  reviewed GEN:  no acute distress RESPIRATORY:  normal respiratory effort, symmetric expansion NEURO:  alert and oriented x 3, no obvious focal deficit PSYCH:  normal affect  ASSESSMENT & PLAN:    1. Syncopal episodes: No further episodes since November of 2020.  He is managed concerning volume status by dialysis.   2. Bradycardia:  No further episodes off of BB. He has not had any palpitations or heart racing which required prn metoprolol.   3. Diabetes: He reports that his Hgb A1c was 9.0 on labs this week. He is contacting his PCP for recommendations.   4. ESRD: Has dialysis M, W, F.  Compliant with this.   COVID-19  Education: The signs and symptoms of COVID-19 were discussed with the patient and how to seek care for testing (follow up with PCP or arrange E-visit). The importance of social distancing was discussed today. He wears mask at dialysis and has had his vaccines.   Time:   Today, I have spent 20  minutes with the patient with telehealth technology discussing the above problems and preparing and reviewing chart.     Medication Adjustments/Labs and Tests Ordered: Current medicines are reviewed at length with the patient today.  Concerns regarding medicines are outlined above.   Tests Ordered: No orders of the defined types were placed in this encounter.   Medication Changes: No orders of the defined types were placed in this encounter.   Disposition:  Follow up 6 months   Signed, Phill Myron. West Pugh, ANP, AACC  12/03/2019 8:58 AM    [facility]

## 2019-12-03 ENCOUNTER — Encounter: Payer: Self-pay | Admitting: Adult Health

## 2019-12-03 ENCOUNTER — Telehealth (INDEPENDENT_AMBULATORY_CARE_PROVIDER_SITE_OTHER): Payer: Medicare Other | Admitting: Adult Health

## 2019-12-03 VITALS — BP 112/57 | Ht 71.0 in | Wt 160.0 lb

## 2019-12-03 DIAGNOSIS — R002 Palpitations: Secondary | ICD-10-CM | POA: Diagnosis not present

## 2019-12-03 DIAGNOSIS — Z992 Dependence on renal dialysis: Secondary | ICD-10-CM

## 2019-12-03 DIAGNOSIS — N186 End stage renal disease: Secondary | ICD-10-CM | POA: Diagnosis not present

## 2019-12-03 DIAGNOSIS — E118 Type 2 diabetes mellitus with unspecified complications: Secondary | ICD-10-CM

## 2019-12-03 NOTE — Patient Instructions (Signed)
Medication Instructions:  Continue current medications  *If you need a refill on your cardiac medications before your next appointment, please call your pharmacy*   Lab Work: None Ordered   Testing/Procedures: None Ordered   Follow-Up: At [facility], you and your health needs are our priority.  As part of our continuing mission to provide you with exceptional heart care, we have created designated Provider Care Teams.  These Care Teams include your primary Cardiologist (physician) and Advanced Practice Providers (APPs -  Physician Assistants and Nurse Practitioners) who all work together to provide you with the care you need, when you need it.  We recommend signing up for the patient portal called "MyChart".  Sign up information is provided on this After Visit Summary.  MyChart is used to connect with patients for Virtual Visits (Telemedicine).  Patients are able to view lab/test results, encounter notes, upcoming appointments, etc.  Non-urgent messages can be sent to your provider as well.   To learn more about what you can do with MyChart, go to [email or web address].    Your next appointment:   6 month(s)  The format for your next appointment:   In Person  Provider:   You may see Buford Dresser, MD or one of the following Advanced Practice Providers on your designated Care Team:    Rosaria Ferries, PA-C  Jory Sims, DNP, ANP  Cadence Kathlen Mody, NP

## 2019-12-04 DIAGNOSIS — E876 Hypokalemia: Secondary | ICD-10-CM | POA: Diagnosis not present

## 2019-12-04 DIAGNOSIS — Z992 Dependence on renal dialysis: Secondary | ICD-10-CM | POA: Diagnosis not present

## 2019-12-04 DIAGNOSIS — D631 Anemia in chronic kidney disease: Secondary | ICD-10-CM | POA: Diagnosis not present

## 2019-12-04 DIAGNOSIS — N186 End stage renal disease: Secondary | ICD-10-CM | POA: Diagnosis not present

## 2019-12-04 DIAGNOSIS — E119 Type 2 diabetes mellitus without complications: Secondary | ICD-10-CM | POA: Diagnosis not present

## 2019-12-04 DIAGNOSIS — N2581 Secondary hyperparathyroidism of renal origin: Secondary | ICD-10-CM | POA: Diagnosis not present

## 2019-12-07 DIAGNOSIS — N186 End stage renal disease: Secondary | ICD-10-CM | POA: Diagnosis not present

## 2019-12-07 DIAGNOSIS — E876 Hypokalemia: Secondary | ICD-10-CM | POA: Diagnosis not present

## 2019-12-07 DIAGNOSIS — D631 Anemia in chronic kidney disease: Secondary | ICD-10-CM | POA: Diagnosis not present

## 2019-12-07 DIAGNOSIS — N2581 Secondary hyperparathyroidism of renal origin: Secondary | ICD-10-CM | POA: Diagnosis not present

## 2019-12-07 DIAGNOSIS — E119 Type 2 diabetes mellitus without complications: Secondary | ICD-10-CM | POA: Diagnosis not present

## 2019-12-07 DIAGNOSIS — Z992 Dependence on renal dialysis: Secondary | ICD-10-CM | POA: Diagnosis not present

## 2019-12-15 DIAGNOSIS — E8770 Fluid overload, unspecified: Secondary | ICD-10-CM | POA: Insufficient documentation

## 2020-01-07 DIAGNOSIS — N186 End stage renal disease: Secondary | ICD-10-CM | POA: Diagnosis not present

## 2020-01-07 DIAGNOSIS — I120 Hypertensive chronic kidney disease with stage 5 chronic kidney disease or end stage renal disease: Secondary | ICD-10-CM | POA: Diagnosis not present

## 2020-01-07 DIAGNOSIS — Z992 Dependence on renal dialysis: Secondary | ICD-10-CM | POA: Diagnosis not present

## 2020-01-08 DIAGNOSIS — D689 Coagulation defect, unspecified: Secondary | ICD-10-CM | POA: Diagnosis not present

## 2020-01-08 DIAGNOSIS — E119 Type 2 diabetes mellitus without complications: Secondary | ICD-10-CM | POA: Diagnosis not present

## 2020-01-08 DIAGNOSIS — D631 Anemia in chronic kidney disease: Secondary | ICD-10-CM | POA: Diagnosis not present

## 2020-01-08 DIAGNOSIS — N186 End stage renal disease: Secondary | ICD-10-CM | POA: Diagnosis not present

## 2020-01-08 DIAGNOSIS — Z992 Dependence on renal dialysis: Secondary | ICD-10-CM | POA: Diagnosis not present

## 2020-01-08 DIAGNOSIS — E876 Hypokalemia: Secondary | ICD-10-CM | POA: Diagnosis not present

## 2020-01-08 DIAGNOSIS — N2581 Secondary hyperparathyroidism of renal origin: Secondary | ICD-10-CM | POA: Diagnosis not present

## 2020-01-08 DIAGNOSIS — D509 Iron deficiency anemia, unspecified: Secondary | ICD-10-CM | POA: Diagnosis not present

## 2020-01-11 DIAGNOSIS — E876 Hypokalemia: Secondary | ICD-10-CM | POA: Diagnosis not present

## 2020-01-11 DIAGNOSIS — D631 Anemia in chronic kidney disease: Secondary | ICD-10-CM | POA: Diagnosis not present

## 2020-01-11 DIAGNOSIS — N2581 Secondary hyperparathyroidism of renal origin: Secondary | ICD-10-CM | POA: Diagnosis not present

## 2020-01-11 DIAGNOSIS — N186 End stage renal disease: Secondary | ICD-10-CM | POA: Diagnosis not present

## 2020-01-11 DIAGNOSIS — D689 Coagulation defect, unspecified: Secondary | ICD-10-CM | POA: Diagnosis not present

## 2020-01-11 DIAGNOSIS — Z992 Dependence on renal dialysis: Secondary | ICD-10-CM | POA: Diagnosis not present

## 2020-01-12 ENCOUNTER — Ambulatory Visit (INDEPENDENT_AMBULATORY_CARE_PROVIDER_SITE_OTHER): Payer: Medicare Other | Admitting: Podiatry

## 2020-01-12 ENCOUNTER — Encounter: Payer: Self-pay | Admitting: Podiatry

## 2020-01-12 ENCOUNTER — Other Ambulatory Visit: Payer: Self-pay

## 2020-01-12 DIAGNOSIS — E118 Type 2 diabetes mellitus with unspecified complications: Secondary | ICD-10-CM

## 2020-01-12 DIAGNOSIS — Z992 Dependence on renal dialysis: Secondary | ICD-10-CM

## 2020-01-12 DIAGNOSIS — M79674 Pain in right toe(s): Secondary | ICD-10-CM

## 2020-01-12 DIAGNOSIS — I739 Peripheral vascular disease, unspecified: Secondary | ICD-10-CM | POA: Diagnosis not present

## 2020-01-12 DIAGNOSIS — N186 End stage renal disease: Secondary | ICD-10-CM | POA: Diagnosis not present

## 2020-01-12 DIAGNOSIS — B351 Tinea unguium: Secondary | ICD-10-CM

## 2020-01-12 DIAGNOSIS — M2010 Hallux valgus (acquired), unspecified foot: Secondary | ICD-10-CM

## 2020-01-12 DIAGNOSIS — M79675 Pain in left toe(s): Secondary | ICD-10-CM

## 2020-01-12 NOTE — Progress Notes (Signed)
This patient returns to my office for at risk foot care.  This patient requires this care by a professional since this patient will be at risk due to having  Diabetes with angiopathy and neuropathy and ESRD.  Patient presents to the office with his wife.  This patient is unable to cut nails himself  since the patient cannot reach their nails.These nails are painful walking and wearing shoes.  This patient presents for at risk foot care today.  General Appearance  Alert, conversant and in no acute stress.  Vascular  Dorsalis pedis and posterior tibial  pulses are not  palpable  bilaterally.  Capillary return is within normal limits  Bilaterally.Cold feet.  bilaterally.  Neurologic  Senn-Weinstein monofilament wire test diminished  bilaterally. Muscle power within normal limits bilaterally.  Nails Thick disfigured discolored nails with subungual debris  from hallux to fifth toes bilaterally. No evidence of bacterial infection or drainage bilaterally.  Orthopedic  No limitations of motion  feet .  No crepitus or effusions noted.  HAV  B/L.  Hammer toe 2  B/L.  Skin  normotropic skin with no porokeratosis noted bilaterally.  No signs of infections or ulcers noted.     Onychomycosis  Pain in right toe  Pain in left toe.  Consent was obtained for treatment procedures.  Debridement and grinding of long thick nails with clearing of subungual debris.  No infection or ulcer.  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   Patient to make an appointment with the pedorthist.   Return office visit  4 months        Told patient to return for periodic foot care and evaluation due to potential at risk complications.   Gardiner Barefoot DPM

## 2020-01-13 DIAGNOSIS — D689 Coagulation defect, unspecified: Secondary | ICD-10-CM | POA: Diagnosis not present

## 2020-01-13 DIAGNOSIS — E876 Hypokalemia: Secondary | ICD-10-CM | POA: Diagnosis not present

## 2020-01-13 DIAGNOSIS — D631 Anemia in chronic kidney disease: Secondary | ICD-10-CM | POA: Diagnosis not present

## 2020-01-13 DIAGNOSIS — N2581 Secondary hyperparathyroidism of renal origin: Secondary | ICD-10-CM | POA: Diagnosis not present

## 2020-01-13 DIAGNOSIS — Z992 Dependence on renal dialysis: Secondary | ICD-10-CM | POA: Diagnosis not present

## 2020-01-13 DIAGNOSIS — N186 End stage renal disease: Secondary | ICD-10-CM | POA: Diagnosis not present

## 2020-01-15 DIAGNOSIS — N186 End stage renal disease: Secondary | ICD-10-CM | POA: Diagnosis not present

## 2020-01-15 DIAGNOSIS — N2581 Secondary hyperparathyroidism of renal origin: Secondary | ICD-10-CM | POA: Diagnosis not present

## 2020-01-15 DIAGNOSIS — D631 Anemia in chronic kidney disease: Secondary | ICD-10-CM | POA: Diagnosis not present

## 2020-01-15 DIAGNOSIS — D689 Coagulation defect, unspecified: Secondary | ICD-10-CM | POA: Diagnosis not present

## 2020-01-15 DIAGNOSIS — Z992 Dependence on renal dialysis: Secondary | ICD-10-CM | POA: Diagnosis not present

## 2020-01-15 DIAGNOSIS — E876 Hypokalemia: Secondary | ICD-10-CM | POA: Diagnosis not present

## 2020-01-18 DIAGNOSIS — E876 Hypokalemia: Secondary | ICD-10-CM | POA: Diagnosis not present

## 2020-01-18 DIAGNOSIS — D631 Anemia in chronic kidney disease: Secondary | ICD-10-CM | POA: Diagnosis not present

## 2020-01-18 DIAGNOSIS — N2581 Secondary hyperparathyroidism of renal origin: Secondary | ICD-10-CM | POA: Diagnosis not present

## 2020-01-18 DIAGNOSIS — D689 Coagulation defect, unspecified: Secondary | ICD-10-CM | POA: Diagnosis not present

## 2020-01-18 DIAGNOSIS — Z992 Dependence on renal dialysis: Secondary | ICD-10-CM | POA: Diagnosis not present

## 2020-01-18 DIAGNOSIS — N186 End stage renal disease: Secondary | ICD-10-CM | POA: Diagnosis not present

## 2020-01-20 ENCOUNTER — Encounter (HOSPITAL_COMMUNITY): Payer: Self-pay | Admitting: Pediatrics

## 2020-01-20 ENCOUNTER — Emergency Department (HOSPITAL_COMMUNITY)
Admission: EM | Admit: 2020-01-20 | Discharge: 2020-01-20 | Disposition: A | Discharge status: Home / Self Care | Payer: Medicare Other | Attending: Emergency Medicine | Admitting: Emergency Medicine

## 2020-01-20 ENCOUNTER — Other Ambulatory Visit: Payer: Self-pay

## 2020-01-20 DIAGNOSIS — R4020 Unspecified coma: Secondary | ICD-10-CM | POA: Diagnosis not present

## 2020-01-20 DIAGNOSIS — Z5321 Procedure and treatment not carried out due to patient leaving prior to being seen by health care provider: Secondary | ICD-10-CM | POA: Diagnosis not present

## 2020-01-20 DIAGNOSIS — N2581 Secondary hyperparathyroidism of renal origin: Secondary | ICD-10-CM | POA: Diagnosis not present

## 2020-01-20 DIAGNOSIS — Z992 Dependence on renal dialysis: Secondary | ICD-10-CM | POA: Diagnosis not present

## 2020-01-20 DIAGNOSIS — D689 Coagulation defect, unspecified: Secondary | ICD-10-CM | POA: Diagnosis not present

## 2020-01-20 DIAGNOSIS — E876 Hypokalemia: Secondary | ICD-10-CM | POA: Diagnosis not present

## 2020-01-20 DIAGNOSIS — R55 Syncope and collapse: Secondary | ICD-10-CM | POA: Diagnosis not present

## 2020-01-20 DIAGNOSIS — N186 End stage renal disease: Secondary | ICD-10-CM | POA: Diagnosis not present

## 2020-01-20 DIAGNOSIS — D631 Anemia in chronic kidney disease: Secondary | ICD-10-CM | POA: Diagnosis not present

## 2020-01-20 DIAGNOSIS — I959 Hypotension, unspecified: Secondary | ICD-10-CM | POA: Diagnosis not present

## 2020-01-20 LAB — CBC
HCT: 38.5 % — ABNORMAL LOW (ref 39.0–52.0)
Hemoglobin: 11.9 g/dL — ABNORMAL LOW (ref 13.0–17.0)
MCH: 29.4 pg (ref 26.0–34.0)
MCHC: 30.9 g/dL (ref 30.0–36.0)
MCV: 95.1 fL (ref 80.0–100.0)
Platelets: 255 10*3/uL (ref 150–400)
RBC: 4.05 MIL/uL — ABNORMAL LOW (ref 4.22–5.81)
RDW: 12.7 % (ref 11.5–15.5)
WBC: 5.7 10*3/uL (ref 4.0–10.5)
nRBC: 0 % (ref 0.0–0.2)

## 2020-01-20 LAB — BASIC METABOLIC PANEL
Anion gap: 16 — ABNORMAL HIGH (ref 5–15)
BUN: 15 mg/dL (ref 6–20)
CO2: 24 mmol/L (ref 22–32)
Calcium: 8.2 mg/dL — ABNORMAL LOW (ref 8.9–10.3)
Chloride: 93 mmol/L — ABNORMAL LOW (ref 98–111)
Creatinine, Ser: 4.07 mg/dL — ABNORMAL HIGH (ref 0.61–1.24)
GFR calc Af Amer: 18 mL/min — ABNORMAL LOW (ref >60)
GFR calc non Af Amer: 15 mL/min — ABNORMAL LOW (ref >60)
Glucose, Bld: 165 mg/dL — ABNORMAL HIGH (ref 70–99)
Potassium: 3.6 mmol/L (ref 3.5–5.1)
Sodium: 133 mmol/L — ABNORMAL LOW (ref 135–145)

## 2020-01-20 MED ORDER — SODIUM CHLORIDE 0.9% FLUSH: 3.0000 mL | Freq: Once | INTRAVENOUS | Status: DC

## 2020-01-20 NOTE — ED Triage Notes (Signed)
Patient arrived via EMS frrom dialysis center. Reported patient had a full dialysis session today abnd 2.8 lbs of fluid was removed. Patient was seating on a bench waiting for ride home when he passed out. EMS endorsed initial SBP was in the 80s and was given 100 cc bolus. Pressure then normalized to 140/86; Patient seating in triage awake, alert and answers appropriately. Stated this happens to him when "they took too much out". Denies dizziness when asked at this time.

## 2020-01-20 NOTE — ED Notes (Signed)
Pt statred he is going to go home and lay down. Pt stated "sometime you just dont feel well after dialysis. im going to go home and lay down in my bed. I will come back if I need to." Pt asked to have his IV taken out. I did and notified nurse. Pt family member and going with him home.

## 2020-01-22 DIAGNOSIS — Z992 Dependence on renal dialysis: Secondary | ICD-10-CM | POA: Diagnosis not present

## 2020-01-22 DIAGNOSIS — D631 Anemia in chronic kidney disease: Secondary | ICD-10-CM | POA: Diagnosis not present

## 2020-01-22 DIAGNOSIS — D689 Coagulation defect, unspecified: Secondary | ICD-10-CM | POA: Diagnosis not present

## 2020-01-22 DIAGNOSIS — E876 Hypokalemia: Secondary | ICD-10-CM | POA: Diagnosis not present

## 2020-01-22 DIAGNOSIS — N2581 Secondary hyperparathyroidism of renal origin: Secondary | ICD-10-CM | POA: Diagnosis not present

## 2020-01-22 DIAGNOSIS — N186 End stage renal disease: Secondary | ICD-10-CM | POA: Diagnosis not present

## 2020-01-25 DIAGNOSIS — E876 Hypokalemia: Secondary | ICD-10-CM | POA: Diagnosis not present

## 2020-01-25 DIAGNOSIS — Z992 Dependence on renal dialysis: Secondary | ICD-10-CM | POA: Diagnosis not present

## 2020-01-25 DIAGNOSIS — N2581 Secondary hyperparathyroidism of renal origin: Secondary | ICD-10-CM | POA: Diagnosis not present

## 2020-01-25 DIAGNOSIS — D689 Coagulation defect, unspecified: Secondary | ICD-10-CM | POA: Diagnosis not present

## 2020-01-25 DIAGNOSIS — N186 End stage renal disease: Secondary | ICD-10-CM | POA: Diagnosis not present

## 2020-01-25 DIAGNOSIS — D631 Anemia in chronic kidney disease: Secondary | ICD-10-CM | POA: Diagnosis not present

## 2020-01-27 DIAGNOSIS — N186 End stage renal disease: Secondary | ICD-10-CM | POA: Diagnosis not present

## 2020-01-27 DIAGNOSIS — D631 Anemia in chronic kidney disease: Secondary | ICD-10-CM | POA: Diagnosis not present

## 2020-01-27 DIAGNOSIS — E876 Hypokalemia: Secondary | ICD-10-CM | POA: Diagnosis not present

## 2020-01-27 DIAGNOSIS — N2581 Secondary hyperparathyroidism of renal origin: Secondary | ICD-10-CM | POA: Diagnosis not present

## 2020-01-27 DIAGNOSIS — Z992 Dependence on renal dialysis: Secondary | ICD-10-CM | POA: Diagnosis not present

## 2020-01-27 DIAGNOSIS — D689 Coagulation defect, unspecified: Secondary | ICD-10-CM | POA: Diagnosis not present

## 2020-01-27 DIAGNOSIS — E1129 Type 2 diabetes mellitus with other diabetic kidney complication: Secondary | ICD-10-CM | POA: Diagnosis not present

## 2020-01-29 DIAGNOSIS — D689 Coagulation defect, unspecified: Secondary | ICD-10-CM | POA: Diagnosis not present

## 2020-01-29 DIAGNOSIS — D631 Anemia in chronic kidney disease: Secondary | ICD-10-CM | POA: Diagnosis not present

## 2020-01-29 DIAGNOSIS — Z992 Dependence on renal dialysis: Secondary | ICD-10-CM | POA: Diagnosis not present

## 2020-01-29 DIAGNOSIS — E876 Hypokalemia: Secondary | ICD-10-CM | POA: Diagnosis not present

## 2020-01-29 DIAGNOSIS — N186 End stage renal disease: Secondary | ICD-10-CM | POA: Diagnosis not present

## 2020-01-29 DIAGNOSIS — N2581 Secondary hyperparathyroidism of renal origin: Secondary | ICD-10-CM | POA: Diagnosis not present

## 2020-02-01 DIAGNOSIS — D689 Coagulation defect, unspecified: Secondary | ICD-10-CM | POA: Diagnosis not present

## 2020-02-01 DIAGNOSIS — N186 End stage renal disease: Secondary | ICD-10-CM | POA: Diagnosis not present

## 2020-02-01 DIAGNOSIS — D631 Anemia in chronic kidney disease: Secondary | ICD-10-CM | POA: Diagnosis not present

## 2020-02-01 DIAGNOSIS — N2581 Secondary hyperparathyroidism of renal origin: Secondary | ICD-10-CM | POA: Diagnosis not present

## 2020-02-01 DIAGNOSIS — E876 Hypokalemia: Secondary | ICD-10-CM | POA: Diagnosis not present

## 2020-02-01 DIAGNOSIS — Z992 Dependence on renal dialysis: Secondary | ICD-10-CM | POA: Diagnosis not present

## 2020-02-02 ENCOUNTER — Ambulatory Visit: Payer: Medicare Other | Admitting: Orthotics

## 2020-02-02 ENCOUNTER — Other Ambulatory Visit: Payer: Self-pay

## 2020-02-02 DIAGNOSIS — E118 Type 2 diabetes mellitus with unspecified complications: Secondary | ICD-10-CM

## 2020-02-02 DIAGNOSIS — I739 Peripheral vascular disease, unspecified: Secondary | ICD-10-CM

## 2020-02-02 DIAGNOSIS — M79674 Pain in right toe(s): Secondary | ICD-10-CM

## 2020-02-02 NOTE — Progress Notes (Signed)

## 2020-02-03 DIAGNOSIS — Z992 Dependence on renal dialysis: Secondary | ICD-10-CM | POA: Diagnosis not present

## 2020-02-03 DIAGNOSIS — N2581 Secondary hyperparathyroidism of renal origin: Secondary | ICD-10-CM | POA: Diagnosis not present

## 2020-02-03 DIAGNOSIS — E876 Hypokalemia: Secondary | ICD-10-CM | POA: Diagnosis not present

## 2020-02-03 DIAGNOSIS — D631 Anemia in chronic kidney disease: Secondary | ICD-10-CM | POA: Diagnosis not present

## 2020-02-03 DIAGNOSIS — N186 End stage renal disease: Secondary | ICD-10-CM | POA: Diagnosis not present

## 2020-02-03 DIAGNOSIS — D689 Coagulation defect, unspecified: Secondary | ICD-10-CM | POA: Diagnosis not present

## 2020-02-05 DIAGNOSIS — Z992 Dependence on renal dialysis: Secondary | ICD-10-CM | POA: Diagnosis not present

## 2020-02-05 DIAGNOSIS — N186 End stage renal disease: Secondary | ICD-10-CM | POA: Diagnosis not present

## 2020-02-05 DIAGNOSIS — D689 Coagulation defect, unspecified: Secondary | ICD-10-CM | POA: Diagnosis not present

## 2020-02-05 DIAGNOSIS — E876 Hypokalemia: Secondary | ICD-10-CM | POA: Diagnosis not present

## 2020-02-05 DIAGNOSIS — N2581 Secondary hyperparathyroidism of renal origin: Secondary | ICD-10-CM | POA: Diagnosis not present

## 2020-02-05 DIAGNOSIS — D631 Anemia in chronic kidney disease: Secondary | ICD-10-CM | POA: Diagnosis not present

## 2020-02-07 DIAGNOSIS — N186 End stage renal disease: Secondary | ICD-10-CM | POA: Diagnosis not present

## 2020-02-07 DIAGNOSIS — I120 Hypertensive chronic kidney disease with stage 5 chronic kidney disease or end stage renal disease: Secondary | ICD-10-CM | POA: Diagnosis not present

## 2020-02-07 DIAGNOSIS — Z992 Dependence on renal dialysis: Secondary | ICD-10-CM | POA: Diagnosis not present

## 2020-02-08 DIAGNOSIS — E119 Type 2 diabetes mellitus without complications: Secondary | ICD-10-CM | POA: Diagnosis not present

## 2020-02-08 DIAGNOSIS — D509 Iron deficiency anemia, unspecified: Secondary | ICD-10-CM | POA: Diagnosis not present

## 2020-02-08 DIAGNOSIS — N2581 Secondary hyperparathyroidism of renal origin: Secondary | ICD-10-CM | POA: Diagnosis not present

## 2020-02-08 DIAGNOSIS — Z992 Dependence on renal dialysis: Secondary | ICD-10-CM | POA: Diagnosis not present

## 2020-02-08 DIAGNOSIS — E876 Hypokalemia: Secondary | ICD-10-CM | POA: Diagnosis not present

## 2020-02-08 DIAGNOSIS — N186 End stage renal disease: Secondary | ICD-10-CM | POA: Diagnosis not present

## 2020-02-08 DIAGNOSIS — D631 Anemia in chronic kidney disease: Secondary | ICD-10-CM | POA: Diagnosis not present

## 2020-02-10 DIAGNOSIS — E876 Hypokalemia: Secondary | ICD-10-CM | POA: Diagnosis not present

## 2020-02-10 DIAGNOSIS — Z992 Dependence on renal dialysis: Secondary | ICD-10-CM | POA: Diagnosis not present

## 2020-02-10 DIAGNOSIS — D631 Anemia in chronic kidney disease: Secondary | ICD-10-CM | POA: Diagnosis not present

## 2020-02-10 DIAGNOSIS — D509 Iron deficiency anemia, unspecified: Secondary | ICD-10-CM | POA: Diagnosis not present

## 2020-02-10 DIAGNOSIS — N186 End stage renal disease: Secondary | ICD-10-CM | POA: Diagnosis not present

## 2020-02-10 DIAGNOSIS — N2581 Secondary hyperparathyroidism of renal origin: Secondary | ICD-10-CM | POA: Diagnosis not present

## 2020-02-12 DIAGNOSIS — D509 Iron deficiency anemia, unspecified: Secondary | ICD-10-CM | POA: Diagnosis not present

## 2020-02-12 DIAGNOSIS — Z992 Dependence on renal dialysis: Secondary | ICD-10-CM | POA: Diagnosis not present

## 2020-02-12 DIAGNOSIS — N186 End stage renal disease: Secondary | ICD-10-CM | POA: Diagnosis not present

## 2020-02-12 DIAGNOSIS — N2581 Secondary hyperparathyroidism of renal origin: Secondary | ICD-10-CM | POA: Diagnosis not present

## 2020-02-12 DIAGNOSIS — E876 Hypokalemia: Secondary | ICD-10-CM | POA: Diagnosis not present

## 2020-02-12 DIAGNOSIS — D631 Anemia in chronic kidney disease: Secondary | ICD-10-CM | POA: Diagnosis not present

## 2020-02-15 DIAGNOSIS — N2581 Secondary hyperparathyroidism of renal origin: Secondary | ICD-10-CM | POA: Diagnosis not present

## 2020-02-15 DIAGNOSIS — D631 Anemia in chronic kidney disease: Secondary | ICD-10-CM | POA: Diagnosis not present

## 2020-02-15 DIAGNOSIS — E876 Hypokalemia: Secondary | ICD-10-CM | POA: Diagnosis not present

## 2020-02-15 DIAGNOSIS — N186 End stage renal disease: Secondary | ICD-10-CM | POA: Diagnosis not present

## 2020-02-15 DIAGNOSIS — D509 Iron deficiency anemia, unspecified: Secondary | ICD-10-CM | POA: Diagnosis not present

## 2020-02-15 DIAGNOSIS — Z992 Dependence on renal dialysis: Secondary | ICD-10-CM | POA: Diagnosis not present

## 2020-02-16 DIAGNOSIS — I999 Unspecified disorder of circulatory system: Secondary | ICD-10-CM | POA: Diagnosis not present

## 2020-02-16 DIAGNOSIS — R55 Syncope and collapse: Secondary | ICD-10-CM | POA: Diagnosis not present

## 2020-02-16 DIAGNOSIS — I6990 Unspecified sequelae of unspecified cerebrovascular disease: Secondary | ICD-10-CM | POA: Diagnosis not present

## 2020-02-16 DIAGNOSIS — J940 Chylous effusion: Secondary | ICD-10-CM | POA: Diagnosis not present

## 2020-02-16 DIAGNOSIS — Z79899 Other long term (current) drug therapy: Secondary | ICD-10-CM | POA: Diagnosis not present

## 2020-02-16 DIAGNOSIS — Z0001 Encounter for general adult medical examination with abnormal findings: Secondary | ICD-10-CM | POA: Diagnosis not present

## 2020-02-16 DIAGNOSIS — E7800 Pure hypercholesterolemia, unspecified: Secondary | ICD-10-CM | POA: Diagnosis not present

## 2020-02-16 DIAGNOSIS — Z125 Encounter for screening for malignant neoplasm of prostate: Secondary | ICD-10-CM | POA: Diagnosis not present

## 2020-02-16 DIAGNOSIS — H547 Unspecified visual loss: Secondary | ICD-10-CM | POA: Diagnosis not present

## 2020-02-16 DIAGNOSIS — E1165 Type 2 diabetes mellitus with hyperglycemia: Secondary | ICD-10-CM | POA: Diagnosis not present

## 2020-02-16 DIAGNOSIS — E559 Vitamin D deficiency, unspecified: Secondary | ICD-10-CM | POA: Diagnosis not present

## 2020-02-16 DIAGNOSIS — I119 Hypertensive heart disease without heart failure: Secondary | ICD-10-CM | POA: Diagnosis not present

## 2020-02-17 DIAGNOSIS — Z992 Dependence on renal dialysis: Secondary | ICD-10-CM | POA: Diagnosis not present

## 2020-02-17 DIAGNOSIS — D509 Iron deficiency anemia, unspecified: Secondary | ICD-10-CM | POA: Diagnosis not present

## 2020-02-17 DIAGNOSIS — E876 Hypokalemia: Secondary | ICD-10-CM | POA: Diagnosis not present

## 2020-02-17 DIAGNOSIS — D631 Anemia in chronic kidney disease: Secondary | ICD-10-CM | POA: Diagnosis not present

## 2020-02-17 DIAGNOSIS — N2581 Secondary hyperparathyroidism of renal origin: Secondary | ICD-10-CM | POA: Diagnosis not present

## 2020-02-17 DIAGNOSIS — N186 End stage renal disease: Secondary | ICD-10-CM | POA: Diagnosis not present

## 2020-02-19 DIAGNOSIS — D509 Iron deficiency anemia, unspecified: Secondary | ICD-10-CM | POA: Diagnosis not present

## 2020-02-19 DIAGNOSIS — N186 End stage renal disease: Secondary | ICD-10-CM | POA: Diagnosis not present

## 2020-02-19 DIAGNOSIS — N2581 Secondary hyperparathyroidism of renal origin: Secondary | ICD-10-CM | POA: Diagnosis not present

## 2020-02-19 DIAGNOSIS — Z992 Dependence on renal dialysis: Secondary | ICD-10-CM | POA: Diagnosis not present

## 2020-02-19 DIAGNOSIS — D631 Anemia in chronic kidney disease: Secondary | ICD-10-CM | POA: Diagnosis not present

## 2020-02-19 DIAGNOSIS — E876 Hypokalemia: Secondary | ICD-10-CM | POA: Diagnosis not present

## 2020-02-22 DIAGNOSIS — D509 Iron deficiency anemia, unspecified: Secondary | ICD-10-CM | POA: Diagnosis not present

## 2020-02-22 DIAGNOSIS — E876 Hypokalemia: Secondary | ICD-10-CM | POA: Diagnosis not present

## 2020-02-22 DIAGNOSIS — Z992 Dependence on renal dialysis: Secondary | ICD-10-CM | POA: Diagnosis not present

## 2020-02-22 DIAGNOSIS — N2581 Secondary hyperparathyroidism of renal origin: Secondary | ICD-10-CM | POA: Diagnosis not present

## 2020-02-22 DIAGNOSIS — N186 End stage renal disease: Secondary | ICD-10-CM | POA: Diagnosis not present

## 2020-02-22 DIAGNOSIS — D631 Anemia in chronic kidney disease: Secondary | ICD-10-CM | POA: Diagnosis not present

## 2020-02-24 DIAGNOSIS — Z992 Dependence on renal dialysis: Secondary | ICD-10-CM | POA: Diagnosis not present

## 2020-02-24 DIAGNOSIS — N186 End stage renal disease: Secondary | ICD-10-CM | POA: Diagnosis not present

## 2020-02-24 DIAGNOSIS — E876 Hypokalemia: Secondary | ICD-10-CM | POA: Diagnosis not present

## 2020-02-24 DIAGNOSIS — D509 Iron deficiency anemia, unspecified: Secondary | ICD-10-CM | POA: Diagnosis not present

## 2020-02-24 DIAGNOSIS — D631 Anemia in chronic kidney disease: Secondary | ICD-10-CM | POA: Diagnosis not present

## 2020-02-24 DIAGNOSIS — N2581 Secondary hyperparathyroidism of renal origin: Secondary | ICD-10-CM | POA: Diagnosis not present

## 2020-02-26 DIAGNOSIS — D631 Anemia in chronic kidney disease: Secondary | ICD-10-CM | POA: Diagnosis not present

## 2020-02-26 DIAGNOSIS — N186 End stage renal disease: Secondary | ICD-10-CM | POA: Diagnosis not present

## 2020-02-26 DIAGNOSIS — D509 Iron deficiency anemia, unspecified: Secondary | ICD-10-CM | POA: Diagnosis not present

## 2020-02-26 DIAGNOSIS — N2581 Secondary hyperparathyroidism of renal origin: Secondary | ICD-10-CM | POA: Diagnosis not present

## 2020-02-26 DIAGNOSIS — Z992 Dependence on renal dialysis: Secondary | ICD-10-CM | POA: Diagnosis not present

## 2020-02-26 DIAGNOSIS — E876 Hypokalemia: Secondary | ICD-10-CM | POA: Diagnosis not present

## 2020-02-29 DIAGNOSIS — D631 Anemia in chronic kidney disease: Secondary | ICD-10-CM | POA: Diagnosis not present

## 2020-02-29 DIAGNOSIS — N2581 Secondary hyperparathyroidism of renal origin: Secondary | ICD-10-CM | POA: Diagnosis not present

## 2020-02-29 DIAGNOSIS — D509 Iron deficiency anemia, unspecified: Secondary | ICD-10-CM | POA: Diagnosis not present

## 2020-02-29 DIAGNOSIS — E876 Hypokalemia: Secondary | ICD-10-CM | POA: Diagnosis not present

## 2020-02-29 DIAGNOSIS — N186 End stage renal disease: Secondary | ICD-10-CM | POA: Diagnosis not present

## 2020-02-29 DIAGNOSIS — Z992 Dependence on renal dialysis: Secondary | ICD-10-CM | POA: Diagnosis not present

## 2020-03-02 ENCOUNTER — Encounter (INDEPENDENT_AMBULATORY_CARE_PROVIDER_SITE_OTHER): Payer: Medicare Other | Admitting: Ophthalmology

## 2020-03-02 DIAGNOSIS — D631 Anemia in chronic kidney disease: Secondary | ICD-10-CM | POA: Diagnosis not present

## 2020-03-02 DIAGNOSIS — Z992 Dependence on renal dialysis: Secondary | ICD-10-CM | POA: Diagnosis not present

## 2020-03-02 DIAGNOSIS — N2581 Secondary hyperparathyroidism of renal origin: Secondary | ICD-10-CM | POA: Diagnosis not present

## 2020-03-02 DIAGNOSIS — E876 Hypokalemia: Secondary | ICD-10-CM | POA: Diagnosis not present

## 2020-03-02 DIAGNOSIS — D509 Iron deficiency anemia, unspecified: Secondary | ICD-10-CM | POA: Diagnosis not present

## 2020-03-02 DIAGNOSIS — N186 End stage renal disease: Secondary | ICD-10-CM | POA: Diagnosis not present

## 2020-03-04 DIAGNOSIS — E876 Hypokalemia: Secondary | ICD-10-CM | POA: Diagnosis not present

## 2020-03-04 DIAGNOSIS — D509 Iron deficiency anemia, unspecified: Secondary | ICD-10-CM | POA: Diagnosis not present

## 2020-03-04 DIAGNOSIS — Z992 Dependence on renal dialysis: Secondary | ICD-10-CM | POA: Diagnosis not present

## 2020-03-04 DIAGNOSIS — N2581 Secondary hyperparathyroidism of renal origin: Secondary | ICD-10-CM | POA: Diagnosis not present

## 2020-03-04 DIAGNOSIS — D631 Anemia in chronic kidney disease: Secondary | ICD-10-CM | POA: Diagnosis not present

## 2020-03-04 DIAGNOSIS — N186 End stage renal disease: Secondary | ICD-10-CM | POA: Diagnosis not present

## 2020-03-07 DIAGNOSIS — N2581 Secondary hyperparathyroidism of renal origin: Secondary | ICD-10-CM | POA: Diagnosis not present

## 2020-03-07 DIAGNOSIS — N186 End stage renal disease: Secondary | ICD-10-CM | POA: Diagnosis not present

## 2020-03-07 DIAGNOSIS — D509 Iron deficiency anemia, unspecified: Secondary | ICD-10-CM | POA: Diagnosis not present

## 2020-03-07 DIAGNOSIS — E876 Hypokalemia: Secondary | ICD-10-CM | POA: Diagnosis not present

## 2020-03-07 DIAGNOSIS — D631 Anemia in chronic kidney disease: Secondary | ICD-10-CM | POA: Diagnosis not present

## 2020-03-07 DIAGNOSIS — Z992 Dependence on renal dialysis: Secondary | ICD-10-CM | POA: Diagnosis not present

## 2020-03-09 DIAGNOSIS — I120 Hypertensive chronic kidney disease with stage 5 chronic kidney disease or end stage renal disease: Secondary | ICD-10-CM | POA: Diagnosis not present

## 2020-03-09 DIAGNOSIS — N2581 Secondary hyperparathyroidism of renal origin: Secondary | ICD-10-CM | POA: Diagnosis not present

## 2020-03-09 DIAGNOSIS — E876 Hypokalemia: Secondary | ICD-10-CM | POA: Diagnosis not present

## 2020-03-09 DIAGNOSIS — E119 Type 2 diabetes mellitus without complications: Secondary | ICD-10-CM | POA: Diagnosis not present

## 2020-03-09 DIAGNOSIS — N186 End stage renal disease: Secondary | ICD-10-CM | POA: Diagnosis not present

## 2020-03-09 DIAGNOSIS — D509 Iron deficiency anemia, unspecified: Secondary | ICD-10-CM | POA: Diagnosis not present

## 2020-03-09 DIAGNOSIS — D631 Anemia in chronic kidney disease: Secondary | ICD-10-CM | POA: Diagnosis not present

## 2020-03-09 DIAGNOSIS — Z992 Dependence on renal dialysis: Secondary | ICD-10-CM | POA: Diagnosis not present

## 2020-03-10 DIAGNOSIS — N186 End stage renal disease: Secondary | ICD-10-CM | POA: Diagnosis not present

## 2020-03-10 DIAGNOSIS — E876 Hypokalemia: Secondary | ICD-10-CM | POA: Diagnosis not present

## 2020-03-10 DIAGNOSIS — D631 Anemia in chronic kidney disease: Secondary | ICD-10-CM | POA: Diagnosis not present

## 2020-03-10 DIAGNOSIS — N2581 Secondary hyperparathyroidism of renal origin: Secondary | ICD-10-CM | POA: Diagnosis not present

## 2020-03-10 DIAGNOSIS — Z992 Dependence on renal dialysis: Secondary | ICD-10-CM | POA: Diagnosis not present

## 2020-03-10 DIAGNOSIS — D509 Iron deficiency anemia, unspecified: Secondary | ICD-10-CM | POA: Diagnosis not present

## 2020-03-14 DIAGNOSIS — E876 Hypokalemia: Secondary | ICD-10-CM | POA: Diagnosis not present

## 2020-03-14 DIAGNOSIS — D509 Iron deficiency anemia, unspecified: Secondary | ICD-10-CM | POA: Diagnosis not present

## 2020-03-14 DIAGNOSIS — N2581 Secondary hyperparathyroidism of renal origin: Secondary | ICD-10-CM | POA: Diagnosis not present

## 2020-03-14 DIAGNOSIS — Z992 Dependence on renal dialysis: Secondary | ICD-10-CM | POA: Diagnosis not present

## 2020-03-14 DIAGNOSIS — D631 Anemia in chronic kidney disease: Secondary | ICD-10-CM | POA: Diagnosis not present

## 2020-03-14 DIAGNOSIS — N186 End stage renal disease: Secondary | ICD-10-CM | POA: Diagnosis not present

## 2020-03-16 DIAGNOSIS — N2581 Secondary hyperparathyroidism of renal origin: Secondary | ICD-10-CM | POA: Diagnosis not present

## 2020-03-16 DIAGNOSIS — D631 Anemia in chronic kidney disease: Secondary | ICD-10-CM | POA: Diagnosis not present

## 2020-03-16 DIAGNOSIS — Z992 Dependence on renal dialysis: Secondary | ICD-10-CM | POA: Diagnosis not present

## 2020-03-16 DIAGNOSIS — D509 Iron deficiency anemia, unspecified: Secondary | ICD-10-CM | POA: Diagnosis not present

## 2020-03-16 DIAGNOSIS — E876 Hypokalemia: Secondary | ICD-10-CM | POA: Diagnosis not present

## 2020-03-16 DIAGNOSIS — N186 End stage renal disease: Secondary | ICD-10-CM | POA: Diagnosis not present

## 2020-03-18 DIAGNOSIS — E876 Hypokalemia: Secondary | ICD-10-CM | POA: Diagnosis not present

## 2020-03-18 DIAGNOSIS — D509 Iron deficiency anemia, unspecified: Secondary | ICD-10-CM | POA: Diagnosis not present

## 2020-03-18 DIAGNOSIS — N2581 Secondary hyperparathyroidism of renal origin: Secondary | ICD-10-CM | POA: Diagnosis not present

## 2020-03-18 DIAGNOSIS — N186 End stage renal disease: Secondary | ICD-10-CM | POA: Diagnosis not present

## 2020-03-18 DIAGNOSIS — D631 Anemia in chronic kidney disease: Secondary | ICD-10-CM | POA: Diagnosis not present

## 2020-03-18 DIAGNOSIS — Z992 Dependence on renal dialysis: Secondary | ICD-10-CM | POA: Diagnosis not present

## 2020-03-21 DIAGNOSIS — Z992 Dependence on renal dialysis: Secondary | ICD-10-CM | POA: Diagnosis not present

## 2020-03-21 DIAGNOSIS — N2581 Secondary hyperparathyroidism of renal origin: Secondary | ICD-10-CM | POA: Diagnosis not present

## 2020-03-21 DIAGNOSIS — E876 Hypokalemia: Secondary | ICD-10-CM | POA: Diagnosis not present

## 2020-03-21 DIAGNOSIS — D631 Anemia in chronic kidney disease: Secondary | ICD-10-CM | POA: Diagnosis not present

## 2020-03-21 DIAGNOSIS — N186 End stage renal disease: Secondary | ICD-10-CM | POA: Diagnosis not present

## 2020-03-21 DIAGNOSIS — D509 Iron deficiency anemia, unspecified: Secondary | ICD-10-CM | POA: Diagnosis not present

## 2020-03-23 DIAGNOSIS — E876 Hypokalemia: Secondary | ICD-10-CM | POA: Diagnosis not present

## 2020-03-23 DIAGNOSIS — D631 Anemia in chronic kidney disease: Secondary | ICD-10-CM | POA: Diagnosis not present

## 2020-03-23 DIAGNOSIS — D509 Iron deficiency anemia, unspecified: Secondary | ICD-10-CM | POA: Diagnosis not present

## 2020-03-23 DIAGNOSIS — N186 End stage renal disease: Secondary | ICD-10-CM | POA: Diagnosis not present

## 2020-03-23 DIAGNOSIS — N2581 Secondary hyperparathyroidism of renal origin: Secondary | ICD-10-CM | POA: Diagnosis not present

## 2020-03-23 DIAGNOSIS — Z992 Dependence on renal dialysis: Secondary | ICD-10-CM | POA: Diagnosis not present

## 2020-03-25 DIAGNOSIS — D631 Anemia in chronic kidney disease: Secondary | ICD-10-CM | POA: Diagnosis not present

## 2020-03-25 DIAGNOSIS — E876 Hypokalemia: Secondary | ICD-10-CM | POA: Diagnosis not present

## 2020-03-25 DIAGNOSIS — N186 End stage renal disease: Secondary | ICD-10-CM | POA: Diagnosis not present

## 2020-03-25 DIAGNOSIS — N2581 Secondary hyperparathyroidism of renal origin: Secondary | ICD-10-CM | POA: Diagnosis not present

## 2020-03-25 DIAGNOSIS — D509 Iron deficiency anemia, unspecified: Secondary | ICD-10-CM | POA: Diagnosis not present

## 2020-03-25 DIAGNOSIS — Z992 Dependence on renal dialysis: Secondary | ICD-10-CM | POA: Diagnosis not present

## 2020-03-28 DIAGNOSIS — E876 Hypokalemia: Secondary | ICD-10-CM | POA: Diagnosis not present

## 2020-03-28 DIAGNOSIS — D631 Anemia in chronic kidney disease: Secondary | ICD-10-CM | POA: Diagnosis not present

## 2020-03-28 DIAGNOSIS — Z992 Dependence on renal dialysis: Secondary | ICD-10-CM | POA: Diagnosis not present

## 2020-03-28 DIAGNOSIS — N2581 Secondary hyperparathyroidism of renal origin: Secondary | ICD-10-CM | POA: Diagnosis not present

## 2020-03-28 DIAGNOSIS — D509 Iron deficiency anemia, unspecified: Secondary | ICD-10-CM | POA: Diagnosis not present

## 2020-03-28 DIAGNOSIS — N186 End stage renal disease: Secondary | ICD-10-CM | POA: Diagnosis not present

## 2020-03-30 DIAGNOSIS — D631 Anemia in chronic kidney disease: Secondary | ICD-10-CM | POA: Diagnosis not present

## 2020-03-30 DIAGNOSIS — N186 End stage renal disease: Secondary | ICD-10-CM | POA: Diagnosis not present

## 2020-03-30 DIAGNOSIS — D509 Iron deficiency anemia, unspecified: Secondary | ICD-10-CM | POA: Diagnosis not present

## 2020-03-30 DIAGNOSIS — N2581 Secondary hyperparathyroidism of renal origin: Secondary | ICD-10-CM | POA: Diagnosis not present

## 2020-03-30 DIAGNOSIS — Z992 Dependence on renal dialysis: Secondary | ICD-10-CM | POA: Diagnosis not present

## 2020-03-30 DIAGNOSIS — E876 Hypokalemia: Secondary | ICD-10-CM | POA: Diagnosis not present

## 2020-04-01 DIAGNOSIS — N2581 Secondary hyperparathyroidism of renal origin: Secondary | ICD-10-CM | POA: Diagnosis not present

## 2020-04-01 DIAGNOSIS — N186 End stage renal disease: Secondary | ICD-10-CM | POA: Diagnosis not present

## 2020-04-01 DIAGNOSIS — E876 Hypokalemia: Secondary | ICD-10-CM | POA: Diagnosis not present

## 2020-04-01 DIAGNOSIS — D509 Iron deficiency anemia, unspecified: Secondary | ICD-10-CM | POA: Diagnosis not present

## 2020-04-01 DIAGNOSIS — Z992 Dependence on renal dialysis: Secondary | ICD-10-CM | POA: Diagnosis not present

## 2020-04-01 DIAGNOSIS — D631 Anemia in chronic kidney disease: Secondary | ICD-10-CM | POA: Diagnosis not present

## 2020-04-04 DIAGNOSIS — Z992 Dependence on renal dialysis: Secondary | ICD-10-CM | POA: Diagnosis not present

## 2020-04-04 DIAGNOSIS — D509 Iron deficiency anemia, unspecified: Secondary | ICD-10-CM | POA: Diagnosis not present

## 2020-04-04 DIAGNOSIS — N186 End stage renal disease: Secondary | ICD-10-CM | POA: Diagnosis not present

## 2020-04-04 DIAGNOSIS — N2581 Secondary hyperparathyroidism of renal origin: Secondary | ICD-10-CM | POA: Diagnosis not present

## 2020-04-04 DIAGNOSIS — E876 Hypokalemia: Secondary | ICD-10-CM | POA: Diagnosis not present

## 2020-04-04 DIAGNOSIS — D631 Anemia in chronic kidney disease: Secondary | ICD-10-CM | POA: Diagnosis not present

## 2020-04-06 ENCOUNTER — Telehealth: Payer: Self-pay | Admitting: Cardiology

## 2020-04-06 DIAGNOSIS — D509 Iron deficiency anemia, unspecified: Secondary | ICD-10-CM | POA: Diagnosis not present

## 2020-04-06 DIAGNOSIS — N186 End stage renal disease: Secondary | ICD-10-CM | POA: Diagnosis not present

## 2020-04-06 DIAGNOSIS — Z992 Dependence on renal dialysis: Secondary | ICD-10-CM | POA: Diagnosis not present

## 2020-04-06 DIAGNOSIS — N2581 Secondary hyperparathyroidism of renal origin: Secondary | ICD-10-CM | POA: Diagnosis not present

## 2020-04-06 DIAGNOSIS — D631 Anemia in chronic kidney disease: Secondary | ICD-10-CM | POA: Diagnosis not present

## 2020-04-06 DIAGNOSIS — E876 Hypokalemia: Secondary | ICD-10-CM | POA: Diagnosis not present

## 2020-04-06 NOTE — Telephone Encounter (Signed)
Called patient to schedule f/u with Dr.Zebbie, and he said he needs to switch his pharmacy. I informed that patient that I would send a message to the nurse to get this taken care of. He said just have the nurse to call him.

## 2020-04-08 DIAGNOSIS — N186 End stage renal disease: Secondary | ICD-10-CM | POA: Diagnosis not present

## 2020-04-08 DIAGNOSIS — N2581 Secondary hyperparathyroidism of renal origin: Secondary | ICD-10-CM | POA: Diagnosis not present

## 2020-04-08 DIAGNOSIS — Z23 Encounter for immunization: Secondary | ICD-10-CM | POA: Diagnosis not present

## 2020-04-08 DIAGNOSIS — D631 Anemia in chronic kidney disease: Secondary | ICD-10-CM | POA: Diagnosis not present

## 2020-04-08 DIAGNOSIS — I120 Hypertensive chronic kidney disease with stage 5 chronic kidney disease or end stage renal disease: Secondary | ICD-10-CM | POA: Diagnosis not present

## 2020-04-08 DIAGNOSIS — D509 Iron deficiency anemia, unspecified: Secondary | ICD-10-CM | POA: Diagnosis not present

## 2020-04-08 DIAGNOSIS — Z992 Dependence on renal dialysis: Secondary | ICD-10-CM | POA: Diagnosis not present

## 2020-04-11 DIAGNOSIS — Z23 Encounter for immunization: Secondary | ICD-10-CM | POA: Diagnosis not present

## 2020-04-11 DIAGNOSIS — D631 Anemia in chronic kidney disease: Secondary | ICD-10-CM | POA: Diagnosis not present

## 2020-04-11 DIAGNOSIS — Z992 Dependence on renal dialysis: Secondary | ICD-10-CM | POA: Diagnosis not present

## 2020-04-11 DIAGNOSIS — D509 Iron deficiency anemia, unspecified: Secondary | ICD-10-CM | POA: Diagnosis not present

## 2020-04-11 DIAGNOSIS — N186 End stage renal disease: Secondary | ICD-10-CM | POA: Diagnosis not present

## 2020-04-11 DIAGNOSIS — N2581 Secondary hyperparathyroidism of renal origin: Secondary | ICD-10-CM | POA: Diagnosis not present

## 2020-04-13 ENCOUNTER — Telehealth: Payer: Self-pay | Admitting: Podiatry

## 2020-04-13 DIAGNOSIS — Z23 Encounter for immunization: Secondary | ICD-10-CM | POA: Diagnosis not present

## 2020-04-13 DIAGNOSIS — N186 End stage renal disease: Secondary | ICD-10-CM | POA: Diagnosis not present

## 2020-04-13 DIAGNOSIS — N2581 Secondary hyperparathyroidism of renal origin: Secondary | ICD-10-CM | POA: Diagnosis not present

## 2020-04-13 DIAGNOSIS — D509 Iron deficiency anemia, unspecified: Secondary | ICD-10-CM | POA: Diagnosis not present

## 2020-04-13 DIAGNOSIS — D631 Anemia in chronic kidney disease: Secondary | ICD-10-CM | POA: Diagnosis not present

## 2020-04-13 DIAGNOSIS — Z992 Dependence on renal dialysis: Secondary | ICD-10-CM | POA: Diagnosis not present

## 2020-04-13 NOTE — Telephone Encounter (Signed)
Pt left message last Friday 10.1 asking for a call back to see if we received the paperwork for his shoes. He was suppose to call back last Monday.  I returned call today and notified pt we did receive the paperwork and the inserts are in production and I will call pt to schedule an appt to pick them up when they come in.

## 2020-04-15 DIAGNOSIS — D631 Anemia in chronic kidney disease: Secondary | ICD-10-CM | POA: Diagnosis not present

## 2020-04-15 DIAGNOSIS — Z992 Dependence on renal dialysis: Secondary | ICD-10-CM | POA: Diagnosis not present

## 2020-04-15 DIAGNOSIS — D509 Iron deficiency anemia, unspecified: Secondary | ICD-10-CM | POA: Diagnosis not present

## 2020-04-15 DIAGNOSIS — N2581 Secondary hyperparathyroidism of renal origin: Secondary | ICD-10-CM | POA: Diagnosis not present

## 2020-04-15 DIAGNOSIS — N186 End stage renal disease: Secondary | ICD-10-CM | POA: Diagnosis not present

## 2020-04-15 DIAGNOSIS — Z23 Encounter for immunization: Secondary | ICD-10-CM | POA: Diagnosis not present

## 2020-04-18 ENCOUNTER — Ambulatory Visit (INDEPENDENT_AMBULATORY_CARE_PROVIDER_SITE_OTHER): Payer: Medicare Other | Admitting: Orthotics

## 2020-04-18 ENCOUNTER — Other Ambulatory Visit: Payer: Self-pay

## 2020-04-18 DIAGNOSIS — M216X2 Other acquired deformities of left foot: Secondary | ICD-10-CM

## 2020-04-18 DIAGNOSIS — Z23 Encounter for immunization: Secondary | ICD-10-CM | POA: Diagnosis not present

## 2020-04-18 DIAGNOSIS — N2581 Secondary hyperparathyroidism of renal origin: Secondary | ICD-10-CM | POA: Diagnosis not present

## 2020-04-18 DIAGNOSIS — Z992 Dependence on renal dialysis: Secondary | ICD-10-CM | POA: Diagnosis not present

## 2020-04-18 DIAGNOSIS — M79674 Pain in right toe(s): Secondary | ICD-10-CM

## 2020-04-18 DIAGNOSIS — D631 Anemia in chronic kidney disease: Secondary | ICD-10-CM | POA: Diagnosis not present

## 2020-04-18 DIAGNOSIS — M79675 Pain in left toe(s): Secondary | ICD-10-CM | POA: Diagnosis not present

## 2020-04-18 DIAGNOSIS — E118 Type 2 diabetes mellitus with unspecified complications: Secondary | ICD-10-CM | POA: Diagnosis not present

## 2020-04-18 DIAGNOSIS — M2010 Hallux valgus (acquired), unspecified foot: Secondary | ICD-10-CM

## 2020-04-18 DIAGNOSIS — D509 Iron deficiency anemia, unspecified: Secondary | ICD-10-CM | POA: Diagnosis not present

## 2020-04-18 DIAGNOSIS — M2011 Hallux valgus (acquired), right foot: Secondary | ICD-10-CM

## 2020-04-18 DIAGNOSIS — B351 Tinea unguium: Secondary | ICD-10-CM | POA: Diagnosis not present

## 2020-04-18 DIAGNOSIS — N186 End stage renal disease: Secondary | ICD-10-CM | POA: Diagnosis not present

## 2020-04-20 ENCOUNTER — Emergency Department (HOSPITAL_COMMUNITY)
Admission: EM | Admit: 2020-04-20 | Discharge: 2020-04-20 | Disposition: A | Discharge status: Home / Self Care | Payer: Medicare Other | Attending: Emergency Medicine | Admitting: Emergency Medicine

## 2020-04-20 ENCOUNTER — Encounter (HOSPITAL_COMMUNITY): Payer: Self-pay | Admitting: *Deleted

## 2020-04-20 DIAGNOSIS — Z992 Dependence on renal dialysis: Secondary | ICD-10-CM | POA: Insufficient documentation

## 2020-04-20 DIAGNOSIS — R55 Syncope and collapse: Secondary | ICD-10-CM | POA: Diagnosis not present

## 2020-04-20 DIAGNOSIS — D631 Anemia in chronic kidney disease: Secondary | ICD-10-CM | POA: Diagnosis not present

## 2020-04-20 DIAGNOSIS — Z23 Encounter for immunization: Secondary | ICD-10-CM | POA: Diagnosis not present

## 2020-04-20 DIAGNOSIS — D509 Iron deficiency anemia, unspecified: Secondary | ICD-10-CM | POA: Diagnosis not present

## 2020-04-20 DIAGNOSIS — N2581 Secondary hyperparathyroidism of renal origin: Secondary | ICD-10-CM | POA: Diagnosis not present

## 2020-04-20 DIAGNOSIS — Z5321 Procedure and treatment not carried out due to patient leaving prior to being seen by health care provider: Secondary | ICD-10-CM | POA: Diagnosis not present

## 2020-04-20 DIAGNOSIS — N186 End stage renal disease: Secondary | ICD-10-CM | POA: Diagnosis not present

## 2020-04-20 DIAGNOSIS — R0902 Hypoxemia: Secondary | ICD-10-CM | POA: Diagnosis not present

## 2020-04-20 DIAGNOSIS — R42 Dizziness and giddiness: Secondary | ICD-10-CM | POA: Diagnosis not present

## 2020-04-20 LAB — CBC
HCT: 41.6 % (ref 39.0–52.0)
Hemoglobin: 13.3 g/dL (ref 13.0–17.0)
MCH: 29.7 pg (ref 26.0–34.0)
MCHC: 32 g/dL (ref 30.0–36.0)
MCV: 92.9 fL (ref 80.0–100.0)
Platelets: 270 10*3/uL (ref 150–400)
RBC: 4.48 MIL/uL (ref 4.22–5.81)
RDW: 13.5 % (ref 11.5–15.5)
WBC: 8.9 10*3/uL (ref 4.0–10.5)
nRBC: 0 % (ref 0.0–0.2)

## 2020-04-20 LAB — BASIC METABOLIC PANEL
Anion gap: 13 (ref 5–15)
BUN: 21 mg/dL — ABNORMAL HIGH (ref 6–20)
CO2: 26 mmol/L (ref 22–32)
Calcium: 8.2 mg/dL — ABNORMAL LOW (ref 8.9–10.3)
Chloride: 93 mmol/L — ABNORMAL LOW (ref 98–111)
Creatinine, Ser: 4.88 mg/dL — ABNORMAL HIGH (ref 0.61–1.24)
GFR, Estimated: 12 mL/min — ABNORMAL LOW (ref >60)
Glucose, Bld: 225 mg/dL — ABNORMAL HIGH (ref 70–99)
Potassium: 3.4 mmol/L — ABNORMAL LOW (ref 3.5–5.1)
Sodium: 132 mmol/L — ABNORMAL LOW (ref 135–145)

## 2020-04-20 NOTE — ED Notes (Signed)
EKG to be done error in charting

## 2020-04-20 NOTE — ED Notes (Signed)
Pt requesting to leave due to feeling better. Screener assisting in calling cab at his request. Pt understands to return if he has changes.

## 2020-04-20 NOTE — ED Triage Notes (Signed)
BIB EMS after completing dialysis, walking to bus had syncopal episode, dialysis gave 1000 ml fluid. Pt states he feels better. 124/[id number]% CBG 167

## 2020-04-22 DIAGNOSIS — N186 End stage renal disease: Secondary | ICD-10-CM | POA: Diagnosis not present

## 2020-04-22 DIAGNOSIS — D631 Anemia in chronic kidney disease: Secondary | ICD-10-CM | POA: Diagnosis not present

## 2020-04-22 DIAGNOSIS — Z992 Dependence on renal dialysis: Secondary | ICD-10-CM | POA: Diagnosis not present

## 2020-04-22 DIAGNOSIS — D509 Iron deficiency anemia, unspecified: Secondary | ICD-10-CM | POA: Diagnosis not present

## 2020-04-22 DIAGNOSIS — N2581 Secondary hyperparathyroidism of renal origin: Secondary | ICD-10-CM | POA: Diagnosis not present

## 2020-04-22 DIAGNOSIS — Z23 Encounter for immunization: Secondary | ICD-10-CM | POA: Diagnosis not present

## 2020-04-23 DIAGNOSIS — D509 Iron deficiency anemia, unspecified: Secondary | ICD-10-CM | POA: Diagnosis not present

## 2020-04-23 DIAGNOSIS — D631 Anemia in chronic kidney disease: Secondary | ICD-10-CM | POA: Diagnosis not present

## 2020-04-23 DIAGNOSIS — Z23 Encounter for immunization: Secondary | ICD-10-CM | POA: Diagnosis not present

## 2020-04-23 DIAGNOSIS — Z992 Dependence on renal dialysis: Secondary | ICD-10-CM | POA: Diagnosis not present

## 2020-04-23 DIAGNOSIS — N2581 Secondary hyperparathyroidism of renal origin: Secondary | ICD-10-CM | POA: Diagnosis not present

## 2020-04-23 DIAGNOSIS — N186 End stage renal disease: Secondary | ICD-10-CM | POA: Diagnosis not present

## 2020-04-27 DIAGNOSIS — N2581 Secondary hyperparathyroidism of renal origin: Secondary | ICD-10-CM | POA: Diagnosis not present

## 2020-04-27 DIAGNOSIS — D631 Anemia in chronic kidney disease: Secondary | ICD-10-CM | POA: Diagnosis not present

## 2020-04-27 DIAGNOSIS — Z992 Dependence on renal dialysis: Secondary | ICD-10-CM | POA: Diagnosis not present

## 2020-04-27 DIAGNOSIS — E1129 Type 2 diabetes mellitus with other diabetic kidney complication: Secondary | ICD-10-CM | POA: Diagnosis not present

## 2020-04-27 DIAGNOSIS — D509 Iron deficiency anemia, unspecified: Secondary | ICD-10-CM | POA: Diagnosis not present

## 2020-04-27 DIAGNOSIS — Z23 Encounter for immunization: Secondary | ICD-10-CM | POA: Diagnosis not present

## 2020-04-27 DIAGNOSIS — N186 End stage renal disease: Secondary | ICD-10-CM | POA: Diagnosis not present

## 2020-04-29 DIAGNOSIS — D631 Anemia in chronic kidney disease: Secondary | ICD-10-CM | POA: Diagnosis not present

## 2020-04-29 DIAGNOSIS — D509 Iron deficiency anemia, unspecified: Secondary | ICD-10-CM | POA: Diagnosis not present

## 2020-04-29 DIAGNOSIS — Z992 Dependence on renal dialysis: Secondary | ICD-10-CM | POA: Diagnosis not present

## 2020-04-29 DIAGNOSIS — N2581 Secondary hyperparathyroidism of renal origin: Secondary | ICD-10-CM | POA: Diagnosis not present

## 2020-04-29 DIAGNOSIS — Z23 Encounter for immunization: Secondary | ICD-10-CM | POA: Diagnosis not present

## 2020-04-29 DIAGNOSIS — N186 End stage renal disease: Secondary | ICD-10-CM | POA: Diagnosis not present

## 2020-05-02 DIAGNOSIS — Z23 Encounter for immunization: Secondary | ICD-10-CM | POA: Diagnosis not present

## 2020-05-02 DIAGNOSIS — N186 End stage renal disease: Secondary | ICD-10-CM | POA: Diagnosis not present

## 2020-05-02 DIAGNOSIS — D509 Iron deficiency anemia, unspecified: Secondary | ICD-10-CM | POA: Diagnosis not present

## 2020-05-02 DIAGNOSIS — D631 Anemia in chronic kidney disease: Secondary | ICD-10-CM | POA: Diagnosis not present

## 2020-05-02 DIAGNOSIS — N2581 Secondary hyperparathyroidism of renal origin: Secondary | ICD-10-CM | POA: Diagnosis not present

## 2020-05-02 DIAGNOSIS — Z992 Dependence on renal dialysis: Secondary | ICD-10-CM | POA: Diagnosis not present

## 2020-05-04 DIAGNOSIS — N2581 Secondary hyperparathyroidism of renal origin: Secondary | ICD-10-CM | POA: Diagnosis not present

## 2020-05-04 DIAGNOSIS — D631 Anemia in chronic kidney disease: Secondary | ICD-10-CM | POA: Diagnosis not present

## 2020-05-04 DIAGNOSIS — Z23 Encounter for immunization: Secondary | ICD-10-CM | POA: Diagnosis not present

## 2020-05-04 DIAGNOSIS — N186 End stage renal disease: Secondary | ICD-10-CM | POA: Diagnosis not present

## 2020-05-04 DIAGNOSIS — Z992 Dependence on renal dialysis: Secondary | ICD-10-CM | POA: Diagnosis not present

## 2020-05-04 DIAGNOSIS — D509 Iron deficiency anemia, unspecified: Secondary | ICD-10-CM | POA: Diagnosis not present

## 2020-05-06 DIAGNOSIS — N2581 Secondary hyperparathyroidism of renal origin: Secondary | ICD-10-CM | POA: Diagnosis not present

## 2020-05-06 DIAGNOSIS — D509 Iron deficiency anemia, unspecified: Secondary | ICD-10-CM | POA: Diagnosis not present

## 2020-05-06 DIAGNOSIS — Z992 Dependence on renal dialysis: Secondary | ICD-10-CM | POA: Diagnosis not present

## 2020-05-06 DIAGNOSIS — N186 End stage renal disease: Secondary | ICD-10-CM | POA: Diagnosis not present

## 2020-05-06 DIAGNOSIS — D631 Anemia in chronic kidney disease: Secondary | ICD-10-CM | POA: Diagnosis not present

## 2020-05-06 DIAGNOSIS — Z23 Encounter for immunization: Secondary | ICD-10-CM | POA: Diagnosis not present

## 2020-05-09 DIAGNOSIS — N2581 Secondary hyperparathyroidism of renal origin: Secondary | ICD-10-CM | POA: Diagnosis not present

## 2020-05-09 DIAGNOSIS — Z992 Dependence on renal dialysis: Secondary | ICD-10-CM | POA: Diagnosis not present

## 2020-05-09 DIAGNOSIS — I120 Hypertensive chronic kidney disease with stage 5 chronic kidney disease or end stage renal disease: Secondary | ICD-10-CM | POA: Diagnosis not present

## 2020-05-09 DIAGNOSIS — E119 Type 2 diabetes mellitus without complications: Secondary | ICD-10-CM | POA: Diagnosis not present

## 2020-05-09 DIAGNOSIS — D631 Anemia in chronic kidney disease: Secondary | ICD-10-CM | POA: Diagnosis not present

## 2020-05-09 DIAGNOSIS — N186 End stage renal disease: Secondary | ICD-10-CM | POA: Diagnosis not present

## 2020-05-09 DIAGNOSIS — D509 Iron deficiency anemia, unspecified: Secondary | ICD-10-CM | POA: Diagnosis not present

## 2020-05-11 DIAGNOSIS — N186 End stage renal disease: Secondary | ICD-10-CM | POA: Diagnosis not present

## 2020-05-11 DIAGNOSIS — Z992 Dependence on renal dialysis: Secondary | ICD-10-CM | POA: Diagnosis not present

## 2020-05-11 DIAGNOSIS — D509 Iron deficiency anemia, unspecified: Secondary | ICD-10-CM | POA: Diagnosis not present

## 2020-05-11 DIAGNOSIS — N2581 Secondary hyperparathyroidism of renal origin: Secondary | ICD-10-CM | POA: Diagnosis not present

## 2020-05-11 DIAGNOSIS — D631 Anemia in chronic kidney disease: Secondary | ICD-10-CM | POA: Diagnosis not present

## 2020-05-11 DIAGNOSIS — E119 Type 2 diabetes mellitus without complications: Secondary | ICD-10-CM | POA: Diagnosis not present

## 2020-05-12 DIAGNOSIS — H33051 Total retinal detachment, right eye: Secondary | ICD-10-CM | POA: Diagnosis not present

## 2020-05-12 DIAGNOSIS — Z961 Presence of intraocular lens: Secondary | ICD-10-CM | POA: Diagnosis not present

## 2020-05-12 DIAGNOSIS — H16122 Filamentary keratitis, left eye: Secondary | ICD-10-CM | POA: Diagnosis not present

## 2020-05-12 DIAGNOSIS — H18511 Endothelial corneal dystrophy, right eye: Secondary | ICD-10-CM | POA: Diagnosis not present

## 2020-05-12 DIAGNOSIS — E113553 Type 2 diabetes mellitus with stable proliferative diabetic retinopathy, bilateral: Secondary | ICD-10-CM | POA: Diagnosis not present

## 2020-05-12 DIAGNOSIS — H401133 Primary open-angle glaucoma, bilateral, severe stage: Secondary | ICD-10-CM | POA: Diagnosis not present

## 2020-05-12 DIAGNOSIS — H16223 Keratoconjunctivitis sicca, not specified as Sjogren's, bilateral: Secondary | ICD-10-CM | POA: Diagnosis not present

## 2020-05-12 DIAGNOSIS — H31093 Other chorioretinal scars, bilateral: Secondary | ICD-10-CM | POA: Diagnosis not present

## 2020-05-13 DIAGNOSIS — Z992 Dependence on renal dialysis: Secondary | ICD-10-CM | POA: Diagnosis not present

## 2020-05-13 DIAGNOSIS — D631 Anemia in chronic kidney disease: Secondary | ICD-10-CM | POA: Diagnosis not present

## 2020-05-13 DIAGNOSIS — D509 Iron deficiency anemia, unspecified: Secondary | ICD-10-CM | POA: Diagnosis not present

## 2020-05-13 DIAGNOSIS — E119 Type 2 diabetes mellitus without complications: Secondary | ICD-10-CM | POA: Diagnosis not present

## 2020-05-13 DIAGNOSIS — N186 End stage renal disease: Secondary | ICD-10-CM | POA: Diagnosis not present

## 2020-05-13 DIAGNOSIS — N2581 Secondary hyperparathyroidism of renal origin: Secondary | ICD-10-CM | POA: Diagnosis not present

## 2020-05-16 DIAGNOSIS — D631 Anemia in chronic kidney disease: Secondary | ICD-10-CM | POA: Diagnosis not present

## 2020-05-16 DIAGNOSIS — D509 Iron deficiency anemia, unspecified: Secondary | ICD-10-CM | POA: Diagnosis not present

## 2020-05-16 DIAGNOSIS — N186 End stage renal disease: Secondary | ICD-10-CM | POA: Diagnosis not present

## 2020-05-16 DIAGNOSIS — N2581 Secondary hyperparathyroidism of renal origin: Secondary | ICD-10-CM | POA: Diagnosis not present

## 2020-05-16 DIAGNOSIS — Z992 Dependence on renal dialysis: Secondary | ICD-10-CM | POA: Diagnosis not present

## 2020-05-16 DIAGNOSIS — E119 Type 2 diabetes mellitus without complications: Secondary | ICD-10-CM | POA: Diagnosis not present

## 2020-05-17 ENCOUNTER — Ambulatory Visit (INDEPENDENT_AMBULATORY_CARE_PROVIDER_SITE_OTHER): Payer: Medicare Other | Admitting: Podiatry

## 2020-05-17 ENCOUNTER — Other Ambulatory Visit: Payer: Self-pay

## 2020-05-17 ENCOUNTER — Encounter: Payer: Self-pay | Admitting: Podiatry

## 2020-05-17 DIAGNOSIS — M2041 Other hammer toe(s) (acquired), right foot: Secondary | ICD-10-CM

## 2020-05-17 DIAGNOSIS — E1151 Type 2 diabetes mellitus with diabetic peripheral angiopathy without gangrene: Secondary | ICD-10-CM

## 2020-05-17 DIAGNOSIS — B351 Tinea unguium: Secondary | ICD-10-CM | POA: Diagnosis not present

## 2020-05-17 DIAGNOSIS — M79674 Pain in right toe(s): Secondary | ICD-10-CM | POA: Diagnosis not present

## 2020-05-17 DIAGNOSIS — M79675 Pain in left toe(s): Secondary | ICD-10-CM

## 2020-05-17 DIAGNOSIS — N186 End stage renal disease: Secondary | ICD-10-CM

## 2020-05-17 DIAGNOSIS — Z992 Dependence on renal dialysis: Secondary | ICD-10-CM

## 2020-05-17 DIAGNOSIS — M2042 Other hammer toe(s) (acquired), left foot: Secondary | ICD-10-CM

## 2020-05-17 DIAGNOSIS — M2011 Hallux valgus (acquired), right foot: Secondary | ICD-10-CM

## 2020-05-17 DIAGNOSIS — M2012 Hallux valgus (acquired), left foot: Secondary | ICD-10-CM

## 2020-05-18 DIAGNOSIS — D631 Anemia in chronic kidney disease: Secondary | ICD-10-CM | POA: Diagnosis not present

## 2020-05-18 DIAGNOSIS — N186 End stage renal disease: Secondary | ICD-10-CM | POA: Diagnosis not present

## 2020-05-18 DIAGNOSIS — D509 Iron deficiency anemia, unspecified: Secondary | ICD-10-CM | POA: Diagnosis not present

## 2020-05-18 DIAGNOSIS — Z992 Dependence on renal dialysis: Secondary | ICD-10-CM | POA: Diagnosis not present

## 2020-05-18 DIAGNOSIS — E119 Type 2 diabetes mellitus without complications: Secondary | ICD-10-CM | POA: Diagnosis not present

## 2020-05-18 DIAGNOSIS — N2581 Secondary hyperparathyroidism of renal origin: Secondary | ICD-10-CM | POA: Diagnosis not present

## 2020-05-20 DIAGNOSIS — N186 End stage renal disease: Secondary | ICD-10-CM | POA: Diagnosis not present

## 2020-05-20 DIAGNOSIS — Z992 Dependence on renal dialysis: Secondary | ICD-10-CM | POA: Diagnosis not present

## 2020-05-20 DIAGNOSIS — E119 Type 2 diabetes mellitus without complications: Secondary | ICD-10-CM | POA: Diagnosis not present

## 2020-05-20 DIAGNOSIS — N2581 Secondary hyperparathyroidism of renal origin: Secondary | ICD-10-CM | POA: Diagnosis not present

## 2020-05-20 DIAGNOSIS — D509 Iron deficiency anemia, unspecified: Secondary | ICD-10-CM | POA: Diagnosis not present

## 2020-05-20 DIAGNOSIS — D631 Anemia in chronic kidney disease: Secondary | ICD-10-CM | POA: Diagnosis not present

## 2020-05-21 NOTE — Progress Notes (Signed)
Subjective:  Patient ID: Shawn [name], male    DOB: 1961/01/08,  MRN: [id number]  59 y.o. male presents with at risk foot care with h/o NIDDM with ESRD on hemodialysis and painful thick toenails that are difficult to trim. Pain interferes with ambulation. Aggravating factors include wearing enclosed shoe gear. Pain is relieved with periodic professional debridement.   Patient's wife is present during today's visit.   Patient did not check blood glucose this morning. He states last A1c was 6.7%.  PCP: Vincente Liberty, MD and last visit was: 11/10/2019.  Review of Systems: Negative except as noted in the HPI.  Past Medical History:  Diagnosis Date  . Anemia   . Arthritis    patient denies  . Blind right eye   . Chronic kidney disease    Dialysis since 2011 MWF  . Complication of anesthesia   . Diabetes mellitus    type II  . Hypertension   . Legally blind   . No pertinent past medical history   . PONV (postoperative nausea and vomiting)    N/V- became dehydrated had to have IV fluids- 01/2015  . Shortness of breath dyspnea    when has too fluid  . Stroke [facility]) 2012   date per patient  . Syncope   . Unspecified cerebral artery occlusion with cerebral infarction 04/02/2013   Past Surgical History:  Procedure Laterality Date  . AV FISTULA PLACEMENT Right 05/12/2013   Procedure: INSERTION OF ARTERIOVENOUS (AV) GORE-TEX GRAFT ARM; ULTRASOUND GUIDED;  Surgeon: Conrad [place], MD;  Location: [place];  Service: Vascular;  Laterality: Right;  . AV FISTULA PLACEMENT Left 01/18/2015   Procedure: INSERTION OF ARTERIOVENOUS GORE-TEX GRAFT LEFT UPPER ARM;  Surgeon: Conrad [place], MD;  Location: [place];  Service: Vascular;  Laterality: Left;  . COLONOSCOPY  06/2012  . EYE SURGERY Bilateral    retina surgery both eyes, cataract surgery both eyes  . HERNIA REPAIR     right inguinal  . INSERTION OF DIALYSIS CATHETER Right   . left arm graft  10/2010  . LIGATION ARTERIOVENOUS GORTEX GRAFT  Left 05/03/2015   Procedure: LIGATION ARTERIOVENOUS GORTEX GRAFT-LEFT UPPER ARM;  Surgeon: Conrad Eustis, MD;  Location: [place];  Service: Vascular;  Laterality: Left;   Patient Active Problem List   Diagnosis Date Noted  . Skin lesion 06/25/2019  . Breast lump 06/25/2019  . Chronic systolic heart failure ([place]) 06/24/2019  . Abnormal findings on diagnostic imaging of lung 06/15/2019  . Shortness of breath 06/15/2019  . 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 S/P thoracentesis   . Acute respiratory failure with hypercapnia ([place]) 05/25/2019  . Acute hypercapnic respiratory failure ([place]) 05/25/2019  . History of CVA (cerebrovascular accident) 05/05/2019  . Pure hypercholesterolemia 05/05/2019  . Pain due to onychomycosis of toenails of both feet 12/23/2018  . Heart palpitations 12/21/2017  . Tingling of left upper extremity 12/21/2017  . Cerebral infarction, unspecified ([place]) 05/23/2017  . Syncope 01/23/2016  . Hypertension 01/23/2016  . Orthostatic hypotension 01/23/2016  . Legally blind   . Diabetes ([place])   . Diabetes mellitus with complication ([place])   . Complication of vascular dialysis catheter 12/14/2015  . Dependence on renal dialysis ([place]) 05/03/2015  . Intracranial atherosclerosis 08/10/2014  . End stage renal disease ([place]) 06/12/2013  . ESRD (end stage renal disease) on dialysis ([place]) 04/17/2013  . Coagulation defect, unspecified ([place]) 04/14/2013  . Cerebral artery occlusion with cerebral infarction ([place]) 04/02/2013  . Dizziness and giddiness 04/02/2013  .  Anemia in chronic kidney disease 09/04/2011    Current Outpatient Medications:  .  ALPHAGAN P 0.1 % SOLN, , Disp: , Rfl:  .  aspirin EC 325 MG tablet, Take 325 mg by mouth at bedtime., Disp: , Rfl:  .  atorvastatin (LIPITOR) 40 MG tablet, Take 1 tablet by mouth once daily, Disp: 90 tablet, Rfl: 2 .  b complex vitamins tablet, Take 1 tablet by mouth at bedtime., Disp: , Rfl:  .  brimonidine (ALPHAGAN) 0.15 % ophthalmic  solution, , Disp: , Rfl:  .  brimonidine (ALPHAGAN) 0.2 % ophthalmic solution, 1 drop 2 (two) times daily., Disp: , Rfl:  .  calcitRIOL (ROCALTROL) 0.5 MCG capsule, Take 1 capsule (0.5 mcg total) by mouth 3 (three) times a week., Disp: 30 capsule, Rfl: 0 .  diphenhydramine-acetaminophen (TYLENOL PM) 25-500 MG TABS tablet, Take 1 tablet by mouth at bedtime as needed (pain)., Disp: , Rfl:  .  dorzolamide-timolol (COSOPT) 22.3-6.8 MG/ML ophthalmic solution, Place 1 drop into both eyes 2 (two) times daily. , Disp: , Rfl:  .  DROPLET PEN NEEDLES 31G X 6 MM MISC, , Disp: , Rfl:  .  hydrOXYzine (ATARAX/VISTARIL) 10 MG tablet, Take 10 mg by mouth daily as needed for itching. , Disp: , Rfl:  .  lanthanum (FOSRENOL) 1000 MG chewable tablet, Chew 1,000 mg by mouth 3 (three) times daily with meals., Disp: , Rfl:  .  latanoprost (XALATAN) 0.005 % ophthalmic solution, Place 1 drop into both eyes at bedtime. , Disp: , Rfl:  .  LEVEMIR FLEXTOUCH 100 UNIT/ML FlexPen, Inject into the skin., Disp: , Rfl:  .  metoprolol tartrate (LOPRESSOR) 25 MG tablet, Take 25 mg by mouth 2 (two) times daily as needed (if BP increases). , Disp: , Rfl: 11 .  midodrine (PROAMATINE) 10 MG tablet, , Disp: , Rfl:  .  UNABLE TO FIND, Heparin Sodium (Porcine) 1,000 Units/mL Catheter Lock Venous, Disp: , Rfl:  .  UNABLE TO FIND, Take by mouth., Disp: , Rfl:  No Known Allergies Social History   Tobacco Use  Smoking Status Never Smoker  Smokeless Tobacco Never Used    Objective:  There were no vitals filed for this visit. Constitutional Patient is a pleasant 59 y.o. African American male WD, WN in NAD. AAO x 3.  Vascular Capillary fill time to digits <3 seconds b/l lower extremities. Nonpalpable DP pulse(s) b/l lower extremities. Nonpalpable PT pulse(s) b/l lower extremities. Pedal hair absent. Lower extremity skin temperature gradient warm to cool. No pain with calf compression b/l. No cyanosis or clubbing noted.  Neurologic Normal  speech. Protective sensation diminished with 10g monofilament b/l.  Dermatologic Pedal skin with normal turgor, texture and tone bilaterally. No open wounds bilaterally. No interdigital macerations bilaterally. Toenails 1-5 b/l elongated, discolored, dystrophic, thickened, crumbly with subungual debris and tenderness to dorsal palpation.  Orthopedic: Normal muscle strength 5/5 to all lower extremity muscle groups bilaterally. Hallux valgus with bunion deformity noted b/l lower extremities. Hammertoes noted to the L 2nd toe and R 2nd toe.    Assessment:   1. Pain due to onychomycosis of toenails of both feet   2. Hallux valgus, acquired, bilateral   3. Acquired hammertoes of both feet   4. ESRD on dialysis ([place])   5. Type II diabetes mellitus with peripheral circulatory disorder [facility])    Plan:  Patient was evaluated and treated and all questions answered.  Onychomycosis with pain -Nails palliatively debridement as below. -Educated on self-care  Procedure: Nail  Debridement Rationale: Pain Type of Debridement: manual, sharp debridement. Instrumentation: Nail nipper, rotary burr. Number of Nails: 10  -Examined patient. -Continue diabetic foot care principles. -Patient to continue soft, supportive shoe gear daily. -Toenails 1-5 b/l were debrided in length and girth with sterile nail nippers and dremel without iatrogenic bleeding.  -Patient to report any pedal injuries to medical professional immediately. -Patient/POA to call should there be question/concern in the interim.  Return in about 4 months (around 09/14/2020) for diabetic foot care.  Marzetta Board, DPM

## 2020-05-23 DIAGNOSIS — D509 Iron deficiency anemia, unspecified: Secondary | ICD-10-CM | POA: Diagnosis not present

## 2020-05-23 DIAGNOSIS — N186 End stage renal disease: Secondary | ICD-10-CM | POA: Diagnosis not present

## 2020-05-23 DIAGNOSIS — E119 Type 2 diabetes mellitus without complications: Secondary | ICD-10-CM | POA: Diagnosis not present

## 2020-05-23 DIAGNOSIS — D631 Anemia in chronic kidney disease: Secondary | ICD-10-CM | POA: Diagnosis not present

## 2020-05-23 DIAGNOSIS — N2581 Secondary hyperparathyroidism of renal origin: Secondary | ICD-10-CM | POA: Diagnosis not present

## 2020-05-23 DIAGNOSIS — Z992 Dependence on renal dialysis: Secondary | ICD-10-CM | POA: Diagnosis not present

## 2020-05-25 DIAGNOSIS — D631 Anemia in chronic kidney disease: Secondary | ICD-10-CM | POA: Diagnosis not present

## 2020-05-25 DIAGNOSIS — D509 Iron deficiency anemia, unspecified: Secondary | ICD-10-CM | POA: Diagnosis not present

## 2020-05-25 DIAGNOSIS — E119 Type 2 diabetes mellitus without complications: Secondary | ICD-10-CM | POA: Diagnosis not present

## 2020-05-25 DIAGNOSIS — N186 End stage renal disease: Secondary | ICD-10-CM | POA: Diagnosis not present

## 2020-05-25 DIAGNOSIS — N2581 Secondary hyperparathyroidism of renal origin: Secondary | ICD-10-CM | POA: Diagnosis not present

## 2020-05-25 DIAGNOSIS — Z992 Dependence on renal dialysis: Secondary | ICD-10-CM | POA: Diagnosis not present

## 2020-05-27 DIAGNOSIS — E119 Type 2 diabetes mellitus without complications: Secondary | ICD-10-CM | POA: Diagnosis not present

## 2020-05-27 DIAGNOSIS — D509 Iron deficiency anemia, unspecified: Secondary | ICD-10-CM | POA: Diagnosis not present

## 2020-05-27 DIAGNOSIS — Z992 Dependence on renal dialysis: Secondary | ICD-10-CM | POA: Diagnosis not present

## 2020-05-27 DIAGNOSIS — N186 End stage renal disease: Secondary | ICD-10-CM | POA: Diagnosis not present

## 2020-05-27 DIAGNOSIS — N2581 Secondary hyperparathyroidism of renal origin: Secondary | ICD-10-CM | POA: Diagnosis not present

## 2020-05-27 DIAGNOSIS — D631 Anemia in chronic kidney disease: Secondary | ICD-10-CM | POA: Diagnosis not present

## 2020-05-29 DIAGNOSIS — E119 Type 2 diabetes mellitus without complications: Secondary | ICD-10-CM | POA: Diagnosis not present

## 2020-05-29 DIAGNOSIS — N186 End stage renal disease: Secondary | ICD-10-CM | POA: Diagnosis not present

## 2020-05-29 DIAGNOSIS — Z992 Dependence on renal dialysis: Secondary | ICD-10-CM | POA: Diagnosis not present

## 2020-05-29 DIAGNOSIS — D509 Iron deficiency anemia, unspecified: Secondary | ICD-10-CM | POA: Diagnosis not present

## 2020-05-29 DIAGNOSIS — D631 Anemia in chronic kidney disease: Secondary | ICD-10-CM | POA: Diagnosis not present

## 2020-05-29 DIAGNOSIS — N2581 Secondary hyperparathyroidism of renal origin: Secondary | ICD-10-CM | POA: Diagnosis not present

## 2020-05-31 DIAGNOSIS — Z992 Dependence on renal dialysis: Secondary | ICD-10-CM | POA: Diagnosis not present

## 2020-05-31 DIAGNOSIS — D631 Anemia in chronic kidney disease: Secondary | ICD-10-CM | POA: Diagnosis not present

## 2020-05-31 DIAGNOSIS — N2581 Secondary hyperparathyroidism of renal origin: Secondary | ICD-10-CM | POA: Diagnosis not present

## 2020-05-31 DIAGNOSIS — D509 Iron deficiency anemia, unspecified: Secondary | ICD-10-CM | POA: Diagnosis not present

## 2020-05-31 DIAGNOSIS — N186 End stage renal disease: Secondary | ICD-10-CM | POA: Diagnosis not present

## 2020-05-31 DIAGNOSIS — E119 Type 2 diabetes mellitus without complications: Secondary | ICD-10-CM | POA: Diagnosis not present

## 2020-06-03 DIAGNOSIS — D631 Anemia in chronic kidney disease: Secondary | ICD-10-CM | POA: Diagnosis not present

## 2020-06-03 DIAGNOSIS — N186 End stage renal disease: Secondary | ICD-10-CM | POA: Diagnosis not present

## 2020-06-03 DIAGNOSIS — Z992 Dependence on renal dialysis: Secondary | ICD-10-CM | POA: Diagnosis not present

## 2020-06-03 DIAGNOSIS — N2581 Secondary hyperparathyroidism of renal origin: Secondary | ICD-10-CM | POA: Diagnosis not present

## 2020-06-03 DIAGNOSIS — E119 Type 2 diabetes mellitus without complications: Secondary | ICD-10-CM | POA: Diagnosis not present

## 2020-06-03 DIAGNOSIS — D509 Iron deficiency anemia, unspecified: Secondary | ICD-10-CM | POA: Diagnosis not present

## 2020-06-06 DIAGNOSIS — Z992 Dependence on renal dialysis: Secondary | ICD-10-CM | POA: Diagnosis not present

## 2020-06-06 DIAGNOSIS — D509 Iron deficiency anemia, unspecified: Secondary | ICD-10-CM | POA: Diagnosis not present

## 2020-06-06 DIAGNOSIS — N186 End stage renal disease: Secondary | ICD-10-CM | POA: Diagnosis not present

## 2020-06-06 DIAGNOSIS — E119 Type 2 diabetes mellitus without complications: Secondary | ICD-10-CM | POA: Diagnosis not present

## 2020-06-06 DIAGNOSIS — D631 Anemia in chronic kidney disease: Secondary | ICD-10-CM | POA: Diagnosis not present

## 2020-06-06 DIAGNOSIS — N2581 Secondary hyperparathyroidism of renal origin: Secondary | ICD-10-CM | POA: Diagnosis not present

## 2020-06-08 DIAGNOSIS — N186 End stage renal disease: Secondary | ICD-10-CM | POA: Diagnosis not present

## 2020-06-08 DIAGNOSIS — I120 Hypertensive chronic kidney disease with stage 5 chronic kidney disease or end stage renal disease: Secondary | ICD-10-CM | POA: Diagnosis not present

## 2020-06-08 DIAGNOSIS — N2581 Secondary hyperparathyroidism of renal origin: Secondary | ICD-10-CM | POA: Diagnosis not present

## 2020-06-08 DIAGNOSIS — E119 Type 2 diabetes mellitus without complications: Secondary | ICD-10-CM | POA: Diagnosis not present

## 2020-06-08 DIAGNOSIS — D631 Anemia in chronic kidney disease: Secondary | ICD-10-CM | POA: Diagnosis not present

## 2020-06-08 DIAGNOSIS — Z992 Dependence on renal dialysis: Secondary | ICD-10-CM | POA: Diagnosis not present

## 2020-06-08 DIAGNOSIS — D509 Iron deficiency anemia, unspecified: Secondary | ICD-10-CM | POA: Diagnosis not present

## 2020-06-10 DIAGNOSIS — Z992 Dependence on renal dialysis: Secondary | ICD-10-CM | POA: Diagnosis not present

## 2020-06-10 DIAGNOSIS — E119 Type 2 diabetes mellitus without complications: Secondary | ICD-10-CM | POA: Diagnosis not present

## 2020-06-10 DIAGNOSIS — N186 End stage renal disease: Secondary | ICD-10-CM | POA: Diagnosis not present

## 2020-06-10 DIAGNOSIS — D509 Iron deficiency anemia, unspecified: Secondary | ICD-10-CM | POA: Diagnosis not present

## 2020-06-10 DIAGNOSIS — N2581 Secondary hyperparathyroidism of renal origin: Secondary | ICD-10-CM | POA: Diagnosis not present

## 2020-06-10 DIAGNOSIS — D631 Anemia in chronic kidney disease: Secondary | ICD-10-CM | POA: Diagnosis not present

## 2020-06-11 DIAGNOSIS — E119 Type 2 diabetes mellitus without complications: Secondary | ICD-10-CM | POA: Diagnosis not present

## 2020-06-11 DIAGNOSIS — N186 End stage renal disease: Secondary | ICD-10-CM | POA: Diagnosis not present

## 2020-06-11 DIAGNOSIS — D509 Iron deficiency anemia, unspecified: Secondary | ICD-10-CM | POA: Diagnosis not present

## 2020-06-11 DIAGNOSIS — N2581 Secondary hyperparathyroidism of renal origin: Secondary | ICD-10-CM | POA: Diagnosis not present

## 2020-06-11 DIAGNOSIS — D631 Anemia in chronic kidney disease: Secondary | ICD-10-CM | POA: Diagnosis not present

## 2020-06-11 DIAGNOSIS — Z992 Dependence on renal dialysis: Secondary | ICD-10-CM | POA: Diagnosis not present

## 2020-06-14 ENCOUNTER — Encounter: Payer: Self-pay | Admitting: Cardiology

## 2020-06-14 ENCOUNTER — Other Ambulatory Visit: Payer: Self-pay

## 2020-06-14 ENCOUNTER — Ambulatory Visit (INDEPENDENT_AMBULATORY_CARE_PROVIDER_SITE_OTHER): Payer: Medicare Other | Admitting: Cardiology

## 2020-06-14 VITALS — BP 130/84 | HR 89 | Ht 71.0 in | Wt 177.0 lb

## 2020-06-14 DIAGNOSIS — Z992 Dependence on renal dialysis: Secondary | ICD-10-CM | POA: Diagnosis not present

## 2020-06-14 DIAGNOSIS — Z8673 Personal history of transient ischemic attack (TIA), and cerebral infarction without residual deficits: Secondary | ICD-10-CM

## 2020-06-14 DIAGNOSIS — R002 Palpitations: Secondary | ICD-10-CM | POA: Diagnosis not present

## 2020-06-14 DIAGNOSIS — E782 Mixed hyperlipidemia: Secondary | ICD-10-CM | POA: Diagnosis not present

## 2020-06-14 DIAGNOSIS — N186 End stage renal disease: Secondary | ICD-10-CM | POA: Diagnosis not present

## 2020-06-14 MED ORDER — ATORVASTATIN CALCIUM 40 MG PO TABS: 40.0000 mg | ORAL_TABLET | Freq: Every day | ORAL | 3 refills | Status: DC

## 2020-06-14 NOTE — Patient Instructions (Signed)
Medication Instructions:  Your Physician recommend you continue on your current medication as directed.    *If you need a refill on your cardiac medications before your next appointment, please call your pharmacy*   Lab Work: None   Testing/Procedures: None   Follow-Up: At [facility], you and your health needs are our priority.  As part of our continuing mission to provide you with exceptional heart care, we have created designated Provider Care Teams.  These Care Teams include your primary Cardiologist (physician) and Advanced Practice Providers (APPs -  Physician Assistants and Nurse Practitioners) who all work together to provide you with the care you need, when you need it.  We recommend signing up for the patient portal called "MyChart".  Sign up information is provided on this After Visit Summary.  MyChart is used to connect with patients for Virtual Visits (Telemedicine).  Patients are able to view lab/test results, encounter notes, upcoming appointments, etc.  Non-urgent messages can be sent to your provider as well.   To learn more about what you can do with MyChart, go to [email or web address].    Your next appointment:   1 year(s)  The format for your next appointment:   In Person  Provider:   Bridgette Jameel, MD     

## 2020-06-14 NOTE — Progress Notes (Signed)
Cardiology Office Note:    Date:  06/14/2020   ID:  Shawn [name], DOB Aug 19, 1960, MRN [id number]  PCP:  Vincente Liberty, MD  Cardiologist:  Buford Dresser, MD  Referring MD: Vincente Liberty, MD   CC: follow up  History of Present Illness:    Shawn [name] is a 59 y.o. male with a hx of end stage renal disease on dialysis, CVA, hypertension, diabetes, legal blindness.  Today: Sometimes when he lays on his left side he feels like the room is spinning. We discussed BPPV, which sounds consistent with his symptoms. Is getting more frequent. Worst is when he is laying on his left side and tries to roll to his back. Plans to discuss with his PCP. Also broke his collarbone on the left side, still having pain when he lifts his left arm. Not improving.   Blood pressures at dialysis have been low, was started on midodrine. Was having episodes of syncope on dialysis, last episode two months ago. Has not required any PRN metoprolol, removed from list today.  Appetite is good, comes and goes. Weight up slightly here today but hasn't noticed any increase in fluid.   Taking aspirin 325 mg daily (often every other day). No issues with bleeding.   Denies chest pain, shortness of breath at rest or with normal exertion. Does note that he can get short of breath with more exertion, like climbing stairs. No PND, orthopnea, LE edema or unexpected weight gain. Rare palpitations, brief. Only with stress.  Past Medical History:  Diagnosis Date  . Anemia   . Arthritis    patient denies  . Blind right eye   . Chronic kidney disease    Dialysis since 2011 MWF  . Complication of anesthesia   . Diabetes mellitus    type II  . Hypertension   . Legally blind   . No pertinent past medical history   . PONV (postoperative nausea and vomiting)    N/V- became dehydrated had to have IV fluids- 01/2015  . Shortness of breath dyspnea    when has too fluid  . Stroke [facility]) 2012   date  per patient  . Syncope   . Unspecified cerebral artery occlusion with cerebral infarction 04/02/2013   Past Surgical History:  Procedure Laterality Date  . AV FISTULA PLACEMENT Right 05/12/2013   Procedure: INSERTION OF ARTERIOVENOUS (AV) GORE-TEX GRAFT ARM; ULTRASOUND GUIDED;  Surgeon: Conrad [place], MD;  Location: [place];  Service: Vascular;  Laterality: Right;  . AV FISTULA PLACEMENT Left 01/18/2015   Procedure: INSERTION OF ARTERIOVENOUS GORE-TEX GRAFT LEFT UPPER ARM;  Surgeon: Conrad [place], MD;  Location: [place];  Service: Vascular;  Laterality: Left;  . COLONOSCOPY  06/2012  . EYE SURGERY Bilateral    retina surgery both eyes, cataract surgery both eyes  . HERNIA REPAIR     right inguinal  . INSERTION OF DIALYSIS CATHETER Right   . left arm graft  10/2010  . LIGATION ARTERIOVENOUS GORTEX GRAFT Left 05/03/2015   Procedure: LIGATION ARTERIOVENOUS GORTEX GRAFT-LEFT UPPER ARM;  Surgeon: Conrad , MD;  Location: [place];  Service: Vascular;  Laterality: Left;     Current Meds  Medication Sig  . ALPHAGAN P 0.1 % SOLN   . aspirin EC 325 MG tablet Take 325 mg by mouth at bedtime.  Marland Kitchen atorvastatin (LIPITOR) 40 MG tablet Take 1 tablet (40 mg total) by mouth daily.  Marland Kitchen b complex vitamins tablet Take 1 tablet by mouth at bedtime.  Marland Kitchen  brimonidine (ALPHAGAN) 0.15 % ophthalmic solution   . brimonidine (ALPHAGAN) 0.2 % ophthalmic solution 1 drop 2 (two) times daily.  . calcitRIOL (ROCALTROL) 0.5 MCG capsule Take 1 capsule (0.5 mcg total) by mouth 3 (three) times a week.  . diphenhydramine-acetaminophen (TYLENOL PM) 25-500 MG TABS tablet Take 1 tablet by mouth at bedtime as needed (pain).  . dorzolamide-timolol (COSOPT) 22.3-6.8 MG/ML ophthalmic solution Place 1 drop into both eyes 2 (two) times daily.   . DROPLET PEN NEEDLES 31G X 6 MM MISC   . hydrOXYzine (ATARAX/VISTARIL) 10 MG tablet Take 10 mg by mouth daily as needed for itching.   Marland Kitchen lanthanum (FOSRENOL) 1000 MG chewable tablet Chew 1,000 mg by  mouth 3 (three) times daily with meals.  . latanoprost (XALATAN) 0.005 % ophthalmic solution Place 1 drop into both eyes at bedtime.   Marland Kitchen LEVEMIR FLEXTOUCH 100 UNIT/ML FlexPen Inject into the skin.  . midodrine (PROAMATINE) 10 MG tablet   . UNABLE TO FIND Heparin Sodium (Porcine) 1,000 Units/mL Catheter Lock Venous  . UNABLE TO FIND Take by mouth.  . [DISCONTINUED] atorvastatin (LIPITOR) 40 MG tablet Take 1 tablet by mouth once daily  . [DISCONTINUED] metoprolol tartrate (LOPRESSOR) 25 MG tablet Take 25 mg by mouth 2 (two) times daily as needed (if BP increases).      Allergies:   Patient has no known allergies.   Social History   Tobacco Use  . Smoking status: Never Smoker  . Smokeless tobacco: Never Used  Vaping Use  . Vaping Use: Never used  Substance Use Topics  . Alcohol use: No    Alcohol/week: 0.0 standard drinks  . Drug use: No     Family Hx: The patient's family history includes Alzheimer's disease in his mother; Cancer in his father and sister; Diabetes in his mother; Heart disease in his father; Stroke in his sister.  ROS:   Please see the history of present illness.   ROS otherwise negative.   Prior CV studies:   The following studies were reviewed today: Lexiscan MPI 01/22/18  There was no ST segment deviation noted during stress.  The left ventricular ejection fraction is mildly decreased (45-54%).  Nuclear stress EF: 51%, there is apicalseptal hypokinesis.  This is a low risk study. With no significant ischemia or perfusion defects identified, however ejection fraction is mildly reduced with apical septal hypokinesis.  Monitor 8.8.19 14 day monitor reviewed. Average heart rate 63 bpm. No tachycardia. 25% of usable tracings were sinus bradycardia, 75% normal sinus rhythm. No pauses. 6 patient triggered events, including one for chest pain. All were sinus rhythm. No significant abnormalities noted on monitor.  Echo from 12/22/17 personally reviewed, agree  with report: Left ventricle: The cavity size was normal. There was mild concentric hypertrophy. Systolic function was mildly reduced. The estimated ejection fraction was in the range of 45% to 50%. Mild diffuse hypokinesis. Doppler parameters are consistent with abnormal left ventricular relaxation (grade 1 diastolic dysfunction). Doppler parameters are consistent with high ventricular filling pressure. - Aortic valve: Transvalvular velocity was within the normal range. There was no stenosis. There was no regurgitation. Valve area (VTI): 2.17 cm^2. Valve area (Vmax): 2.16 cm^2. Valve area (Vmean): 2.14 cm^2. - Mitral valve: Transvalvular velocity was within the normal range. There was no evidence for stenosis. There was trivial regurgitation. - Left atrium: The atrium was mildly dilated. - Right ventricle: The cavity size was normal. Wall thickness was normal. Systolic function was normal. - Tricuspid valve: There was trivial  regurgitation. - Pulmonary arteries: Systolic pressure was within the normal range. PA peak pressure: 35 mm Hg (S).  Labs/Other Tests and Data Reviewed:    EKG:  An ECG dated 04/20/20 was personally reviewed today and demonstrated:  sinus rhythm at 66 bpm  Recent Labs: 04/20/2020: BUN 21; Creatinine, Ser 4.88; Hemoglobin 13.3; Platelets 270; Potassium 3.4; Sodium 132   Recent Lipid Panel Lab Results  Component Value Date/Time   CHOL (H) 01/26/2010 05:20 AM    207        ATP III CLASSIFICATION:  <200     mg/dL   Desirable  200-239  mg/dL   Borderline High  >=240    mg/dL   High          TRIG 115 01/26/2010 05:20 AM   HDL 39 (L) 01/26/2010 05:20 AM   CHOLHDL 5.3 01/26/2010 05:20 AM   LDLCALC (H) 01/26/2010 05:20 AM    145        Total Cholesterol/HDL:CHD Risk Coronary Heart Disease Risk Table                     Men   Women  1/2 Average Risk   3.4   3.3  Average Risk       5.0   4.4  2 X Average Risk   9.6   7.1  3 X  Average Risk  23.4   11.0        Use the calculated Patient Ratio above and the CHD Risk Table to determine the patient's CHD Risk.        ATP III CLASSIFICATION (LDL):  <100     mg/dL   Optimal  100-129  mg/dL   Near or Above                    Optimal  130-159  mg/dL   Borderline  160-189  mg/dL   High  >190     mg/dL   Very High    Wt Readings from Last 3 Encounters:  06/14/20 177 lb (80.3 kg)  01/20/20 162 lb (73.5 kg)  12/03/19 160 lb (72.6 kg)     Objective:    Vital Signs:  BP 130/84   Pulse 89   Ht 5\' 11"  (1.803 m)   Wt 177 lb (80.3 kg)   SpO2 99%   BMI 24.69 kg/m    GEN: Well nourished, well developed in no acute distress HEENT: Normal, moist mucous membranes NECK: No JVD CARDIAC: regular rhythm, normal S1 and S2, no rubs or gallops. No murmur. VASCULAR: Radial and DP pulses 2+ bilaterally. No carotid bruits RESPIRATORY:  Clear to auscultation without rales, wheezing or rhonchi  ABDOMEN: Soft, non-tender, non-distended MUSCULOSKELETAL:  Ambulates independently SKIN: Warm and dry, no edema NEUROLOGIC:  Alert and oriented x 3. Legally blind. PSYCHIATRIC:  Normal affect   ASSESSMENT & PLAN:    History of CVA: no further events -discussed than from a CV standpoint, ok to drop to 81 mg aspirin. He takes the 325 mg dose about every other day and is comfortable continuing that regimen, -no active bleeding  ESRD on dialysis, complicated by hypertension and type II diabetes: -has had more hypotension on dialysis, now on midodrine. Off of all antihypertensives -management of ESRD/HTN by nephrology -diabetes management per PCP/endo  Hyperlipidemia:  Lipids 02/16/20 per [facility]: Tchol 221, HDL 47, LDL 151, TG 117 -continue atorvastatin 40 mg, refilled today and has been out for some time (perhaps  why lipids elevated?) -Ideally would like LDL <70, but there is unclear data for targets in ESRD. For now would continue/restart atorvastatin 40 mg.  Palpitations:  infrequent, manageable -no high risk features  Cardiovascular risk factors and prevention -discussed lifestyle recommendations. Exercise ability limited given legal blindness. On renal diet.  Likely BPPV: will discuss with his PCP  1. History of CVA (cerebrovascular accident)   2. Mixed hyperlipidemia   3. ESRD (end stage renal disease) on dialysis ([place])   4. Heart palpitations    Patient Instructions  Medication Instructions:  Your Physician recommend you continue on your current medication as directed.    *If you need a refill on your cardiac medications before your next appointment, please call your pharmacy*   Lab Work: None   Testing/Procedures: None   Follow-Up: At [facility], you and your health needs are our priority.  As part of our continuing mission to provide you with exceptional heart care, we have created designated Provider Care Teams.  These Care Teams include your primary Cardiologist (physician) and Advanced Practice Providers (APPs -  Physician Assistants and Nurse Practitioners) who all work together to provide you with the care you need, when you need it.  We recommend signing up for the patient portal called "MyChart".  Sign up information is provided on this After Visit Summary.  MyChart is used to connect with patients for Virtual Visits (Telemedicine).  Patients are able to view lab/test results, encounter notes, upcoming appointments, etc.  Non-urgent messages can be sent to your provider as well.   To learn more about what you can do with MyChart, go to [email or web address].    Your next appointment:   1 year(s)  The format for your next appointment:   In Person  Provider:   Buford Dresser, MD      Follow Up:  12 mos  Signed, Buford Dresser, MD  06/14/2020 10:45 AM    [place]

## 2020-06-15 DIAGNOSIS — N2581 Secondary hyperparathyroidism of renal origin: Secondary | ICD-10-CM | POA: Diagnosis not present

## 2020-06-15 DIAGNOSIS — D631 Anemia in chronic kidney disease: Secondary | ICD-10-CM | POA: Diagnosis not present

## 2020-06-15 DIAGNOSIS — D509 Iron deficiency anemia, unspecified: Secondary | ICD-10-CM | POA: Diagnosis not present

## 2020-06-15 DIAGNOSIS — E119 Type 2 diabetes mellitus without complications: Secondary | ICD-10-CM | POA: Diagnosis not present

## 2020-06-15 DIAGNOSIS — Z992 Dependence on renal dialysis: Secondary | ICD-10-CM | POA: Diagnosis not present

## 2020-06-15 DIAGNOSIS — N186 End stage renal disease: Secondary | ICD-10-CM | POA: Diagnosis not present

## 2020-06-17 DIAGNOSIS — N2581 Secondary hyperparathyroidism of renal origin: Secondary | ICD-10-CM | POA: Diagnosis not present

## 2020-06-17 DIAGNOSIS — E119 Type 2 diabetes mellitus without complications: Secondary | ICD-10-CM | POA: Diagnosis not present

## 2020-06-17 DIAGNOSIS — Z992 Dependence on renal dialysis: Secondary | ICD-10-CM | POA: Diagnosis not present

## 2020-06-17 DIAGNOSIS — D509 Iron deficiency anemia, unspecified: Secondary | ICD-10-CM | POA: Diagnosis not present

## 2020-06-17 DIAGNOSIS — N186 End stage renal disease: Secondary | ICD-10-CM | POA: Diagnosis not present

## 2020-06-17 DIAGNOSIS — D631 Anemia in chronic kidney disease: Secondary | ICD-10-CM | POA: Diagnosis not present

## 2020-06-20 DIAGNOSIS — Z992 Dependence on renal dialysis: Secondary | ICD-10-CM | POA: Diagnosis not present

## 2020-06-20 DIAGNOSIS — E119 Type 2 diabetes mellitus without complications: Secondary | ICD-10-CM | POA: Diagnosis not present

## 2020-06-20 DIAGNOSIS — N2581 Secondary hyperparathyroidism of renal origin: Secondary | ICD-10-CM | POA: Diagnosis not present

## 2020-06-20 DIAGNOSIS — D509 Iron deficiency anemia, unspecified: Secondary | ICD-10-CM | POA: Diagnosis not present

## 2020-06-20 DIAGNOSIS — D631 Anemia in chronic kidney disease: Secondary | ICD-10-CM | POA: Diagnosis not present

## 2020-06-20 DIAGNOSIS — N186 End stage renal disease: Secondary | ICD-10-CM | POA: Diagnosis not present

## 2020-06-21 DIAGNOSIS — I999 Unspecified disorder of circulatory system: Secondary | ICD-10-CM | POA: Diagnosis not present

## 2020-06-21 DIAGNOSIS — Z79899 Other long term (current) drug therapy: Secondary | ICD-10-CM | POA: Diagnosis not present

## 2020-06-21 DIAGNOSIS — M2012 Hallux valgus (acquired), left foot: Secondary | ICD-10-CM | POA: Diagnosis not present

## 2020-06-21 DIAGNOSIS — E7800 Pure hypercholesterolemia, unspecified: Secondary | ICD-10-CM | POA: Diagnosis not present

## 2020-06-21 DIAGNOSIS — M2011 Hallux valgus (acquired), right foot: Secondary | ICD-10-CM | POA: Diagnosis not present

## 2020-06-21 DIAGNOSIS — I6990 Unspecified sequelae of unspecified cerebrovascular disease: Secondary | ICD-10-CM | POA: Diagnosis not present

## 2020-06-21 DIAGNOSIS — E1140 Type 2 diabetes mellitus with diabetic neuropathy, unspecified: Secondary | ICD-10-CM | POA: Diagnosis not present

## 2020-06-21 DIAGNOSIS — E569 Vitamin deficiency, unspecified: Secondary | ICD-10-CM | POA: Diagnosis not present

## 2020-06-21 DIAGNOSIS — H547 Unspecified visual loss: Secondary | ICD-10-CM | POA: Diagnosis not present

## 2020-06-21 DIAGNOSIS — R55 Syncope and collapse: Secondary | ICD-10-CM | POA: Diagnosis not present

## 2020-06-21 DIAGNOSIS — I119 Hypertensive heart disease without heart failure: Secondary | ICD-10-CM | POA: Diagnosis not present

## 2020-06-22 DIAGNOSIS — Z992 Dependence on renal dialysis: Secondary | ICD-10-CM | POA: Diagnosis not present

## 2020-06-22 DIAGNOSIS — D509 Iron deficiency anemia, unspecified: Secondary | ICD-10-CM | POA: Diagnosis not present

## 2020-06-22 DIAGNOSIS — E119 Type 2 diabetes mellitus without complications: Secondary | ICD-10-CM | POA: Diagnosis not present

## 2020-06-22 DIAGNOSIS — N2581 Secondary hyperparathyroidism of renal origin: Secondary | ICD-10-CM | POA: Diagnosis not present

## 2020-06-22 DIAGNOSIS — D631 Anemia in chronic kidney disease: Secondary | ICD-10-CM | POA: Diagnosis not present

## 2020-06-22 DIAGNOSIS — N186 End stage renal disease: Secondary | ICD-10-CM | POA: Diagnosis not present

## 2020-06-24 DIAGNOSIS — D631 Anemia in chronic kidney disease: Secondary | ICD-10-CM | POA: Diagnosis not present

## 2020-06-24 DIAGNOSIS — Z992 Dependence on renal dialysis: Secondary | ICD-10-CM | POA: Diagnosis not present

## 2020-06-24 DIAGNOSIS — N2581 Secondary hyperparathyroidism of renal origin: Secondary | ICD-10-CM | POA: Diagnosis not present

## 2020-06-24 DIAGNOSIS — N186 End stage renal disease: Secondary | ICD-10-CM | POA: Diagnosis not present

## 2020-06-24 DIAGNOSIS — E119 Type 2 diabetes mellitus without complications: Secondary | ICD-10-CM | POA: Diagnosis not present

## 2020-06-24 DIAGNOSIS — D509 Iron deficiency anemia, unspecified: Secondary | ICD-10-CM | POA: Diagnosis not present

## 2020-06-27 DIAGNOSIS — D631 Anemia in chronic kidney disease: Secondary | ICD-10-CM | POA: Diagnosis not present

## 2020-06-27 DIAGNOSIS — Z992 Dependence on renal dialysis: Secondary | ICD-10-CM | POA: Diagnosis not present

## 2020-06-27 DIAGNOSIS — N186 End stage renal disease: Secondary | ICD-10-CM | POA: Diagnosis not present

## 2020-06-27 DIAGNOSIS — E119 Type 2 diabetes mellitus without complications: Secondary | ICD-10-CM | POA: Diagnosis not present

## 2020-06-27 DIAGNOSIS — N2581 Secondary hyperparathyroidism of renal origin: Secondary | ICD-10-CM | POA: Diagnosis not present

## 2020-06-27 DIAGNOSIS — D509 Iron deficiency anemia, unspecified: Secondary | ICD-10-CM | POA: Diagnosis not present

## 2020-06-29 DIAGNOSIS — N186 End stage renal disease: Secondary | ICD-10-CM | POA: Diagnosis not present

## 2020-06-29 DIAGNOSIS — N2581 Secondary hyperparathyroidism of renal origin: Secondary | ICD-10-CM | POA: Diagnosis not present

## 2020-06-29 DIAGNOSIS — E119 Type 2 diabetes mellitus without complications: Secondary | ICD-10-CM | POA: Diagnosis not present

## 2020-06-29 DIAGNOSIS — D631 Anemia in chronic kidney disease: Secondary | ICD-10-CM | POA: Diagnosis not present

## 2020-06-29 DIAGNOSIS — D509 Iron deficiency anemia, unspecified: Secondary | ICD-10-CM | POA: Diagnosis not present

## 2020-06-29 DIAGNOSIS — Z992 Dependence on renal dialysis: Secondary | ICD-10-CM | POA: Diagnosis not present

## 2020-07-01 DIAGNOSIS — Z992 Dependence on renal dialysis: Secondary | ICD-10-CM | POA: Diagnosis not present

## 2020-07-01 DIAGNOSIS — N186 End stage renal disease: Secondary | ICD-10-CM | POA: Diagnosis not present

## 2020-07-01 DIAGNOSIS — D631 Anemia in chronic kidney disease: Secondary | ICD-10-CM | POA: Diagnosis not present

## 2020-07-01 DIAGNOSIS — N2581 Secondary hyperparathyroidism of renal origin: Secondary | ICD-10-CM | POA: Diagnosis not present

## 2020-07-01 DIAGNOSIS — E119 Type 2 diabetes mellitus without complications: Secondary | ICD-10-CM | POA: Diagnosis not present

## 2020-07-01 DIAGNOSIS — D509 Iron deficiency anemia, unspecified: Secondary | ICD-10-CM | POA: Diagnosis not present

## 2020-07-04 DIAGNOSIS — N186 End stage renal disease: Secondary | ICD-10-CM | POA: Diagnosis not present

## 2020-07-04 DIAGNOSIS — D631 Anemia in chronic kidney disease: Secondary | ICD-10-CM | POA: Diagnosis not present

## 2020-07-04 DIAGNOSIS — N2581 Secondary hyperparathyroidism of renal origin: Secondary | ICD-10-CM | POA: Diagnosis not present

## 2020-07-04 DIAGNOSIS — Z992 Dependence on renal dialysis: Secondary | ICD-10-CM | POA: Diagnosis not present

## 2020-07-04 DIAGNOSIS — E119 Type 2 diabetes mellitus without complications: Secondary | ICD-10-CM | POA: Diagnosis not present

## 2020-07-04 DIAGNOSIS — D509 Iron deficiency anemia, unspecified: Secondary | ICD-10-CM | POA: Diagnosis not present

## 2020-07-06 DIAGNOSIS — N2581 Secondary hyperparathyroidism of renal origin: Secondary | ICD-10-CM | POA: Diagnosis not present

## 2020-07-06 DIAGNOSIS — E119 Type 2 diabetes mellitus without complications: Secondary | ICD-10-CM | POA: Diagnosis not present

## 2020-07-06 DIAGNOSIS — N186 End stage renal disease: Secondary | ICD-10-CM | POA: Diagnosis not present

## 2020-07-06 DIAGNOSIS — D631 Anemia in chronic kidney disease: Secondary | ICD-10-CM | POA: Diagnosis not present

## 2020-07-06 DIAGNOSIS — Z992 Dependence on renal dialysis: Secondary | ICD-10-CM | POA: Diagnosis not present

## 2020-07-06 DIAGNOSIS — D509 Iron deficiency anemia, unspecified: Secondary | ICD-10-CM | POA: Diagnosis not present

## 2020-07-07 DIAGNOSIS — Z23 Encounter for immunization: Secondary | ICD-10-CM | POA: Diagnosis not present

## 2020-07-08 DIAGNOSIS — N186 End stage renal disease: Secondary | ICD-10-CM | POA: Diagnosis not present

## 2020-07-08 DIAGNOSIS — D509 Iron deficiency anemia, unspecified: Secondary | ICD-10-CM | POA: Diagnosis not present

## 2020-07-08 DIAGNOSIS — D631 Anemia in chronic kidney disease: Secondary | ICD-10-CM | POA: Diagnosis not present

## 2020-07-08 DIAGNOSIS — N2581 Secondary hyperparathyroidism of renal origin: Secondary | ICD-10-CM | POA: Diagnosis not present

## 2020-07-08 DIAGNOSIS — E119 Type 2 diabetes mellitus without complications: Secondary | ICD-10-CM | POA: Diagnosis not present

## 2020-07-08 DIAGNOSIS — Z992 Dependence on renal dialysis: Secondary | ICD-10-CM | POA: Diagnosis not present

## 2020-07-09 DIAGNOSIS — Z992 Dependence on renal dialysis: Secondary | ICD-10-CM | POA: Diagnosis not present

## 2020-07-09 DIAGNOSIS — I120 Hypertensive chronic kidney disease with stage 5 chronic kidney disease or end stage renal disease: Secondary | ICD-10-CM | POA: Diagnosis not present

## 2020-07-09 DIAGNOSIS — N186 End stage renal disease: Secondary | ICD-10-CM | POA: Diagnosis not present

## 2020-07-11 DIAGNOSIS — N186 End stage renal disease: Secondary | ICD-10-CM | POA: Diagnosis not present

## 2020-07-11 DIAGNOSIS — D509 Iron deficiency anemia, unspecified: Secondary | ICD-10-CM | POA: Diagnosis not present

## 2020-07-11 DIAGNOSIS — D631 Anemia in chronic kidney disease: Secondary | ICD-10-CM | POA: Diagnosis not present

## 2020-07-11 DIAGNOSIS — N2581 Secondary hyperparathyroidism of renal origin: Secondary | ICD-10-CM | POA: Diagnosis not present

## 2020-07-11 DIAGNOSIS — E119 Type 2 diabetes mellitus without complications: Secondary | ICD-10-CM | POA: Diagnosis not present

## 2020-07-11 DIAGNOSIS — Z992 Dependence on renal dialysis: Secondary | ICD-10-CM | POA: Diagnosis not present

## 2020-07-13 DIAGNOSIS — D631 Anemia in chronic kidney disease: Secondary | ICD-10-CM | POA: Diagnosis not present

## 2020-07-13 DIAGNOSIS — E119 Type 2 diabetes mellitus without complications: Secondary | ICD-10-CM | POA: Diagnosis not present

## 2020-07-13 DIAGNOSIS — N186 End stage renal disease: Secondary | ICD-10-CM | POA: Diagnosis not present

## 2020-07-13 DIAGNOSIS — N2581 Secondary hyperparathyroidism of renal origin: Secondary | ICD-10-CM | POA: Diagnosis not present

## 2020-07-13 DIAGNOSIS — D509 Iron deficiency anemia, unspecified: Secondary | ICD-10-CM | POA: Diagnosis not present

## 2020-07-13 DIAGNOSIS — Z992 Dependence on renal dialysis: Secondary | ICD-10-CM | POA: Diagnosis not present

## 2020-07-15 DIAGNOSIS — D631 Anemia in chronic kidney disease: Secondary | ICD-10-CM | POA: Diagnosis not present

## 2020-07-15 DIAGNOSIS — D509 Iron deficiency anemia, unspecified: Secondary | ICD-10-CM | POA: Diagnosis not present

## 2020-07-15 DIAGNOSIS — N186 End stage renal disease: Secondary | ICD-10-CM | POA: Diagnosis not present

## 2020-07-15 DIAGNOSIS — E119 Type 2 diabetes mellitus without complications: Secondary | ICD-10-CM | POA: Diagnosis not present

## 2020-07-15 DIAGNOSIS — Z992 Dependence on renal dialysis: Secondary | ICD-10-CM | POA: Diagnosis not present

## 2020-07-15 DIAGNOSIS — N2581 Secondary hyperparathyroidism of renal origin: Secondary | ICD-10-CM | POA: Diagnosis not present

## 2020-07-18 DIAGNOSIS — N2581 Secondary hyperparathyroidism of renal origin: Secondary | ICD-10-CM | POA: Diagnosis not present

## 2020-07-18 DIAGNOSIS — D631 Anemia in chronic kidney disease: Secondary | ICD-10-CM | POA: Diagnosis not present

## 2020-07-18 DIAGNOSIS — N186 End stage renal disease: Secondary | ICD-10-CM | POA: Diagnosis not present

## 2020-07-18 DIAGNOSIS — D509 Iron deficiency anemia, unspecified: Secondary | ICD-10-CM | POA: Diagnosis not present

## 2020-07-18 DIAGNOSIS — Z992 Dependence on renal dialysis: Secondary | ICD-10-CM | POA: Diagnosis not present

## 2020-07-18 DIAGNOSIS — E119 Type 2 diabetes mellitus without complications: Secondary | ICD-10-CM | POA: Diagnosis not present

## 2020-07-20 DIAGNOSIS — Z992 Dependence on renal dialysis: Secondary | ICD-10-CM | POA: Diagnosis not present

## 2020-07-20 DIAGNOSIS — E119 Type 2 diabetes mellitus without complications: Secondary | ICD-10-CM | POA: Diagnosis not present

## 2020-07-20 DIAGNOSIS — N2581 Secondary hyperparathyroidism of renal origin: Secondary | ICD-10-CM | POA: Diagnosis not present

## 2020-07-20 DIAGNOSIS — D631 Anemia in chronic kidney disease: Secondary | ICD-10-CM | POA: Diagnosis not present

## 2020-07-20 DIAGNOSIS — N186 End stage renal disease: Secondary | ICD-10-CM | POA: Diagnosis not present

## 2020-07-20 DIAGNOSIS — D509 Iron deficiency anemia, unspecified: Secondary | ICD-10-CM | POA: Diagnosis not present

## 2020-07-22 DIAGNOSIS — Z992 Dependence on renal dialysis: Secondary | ICD-10-CM | POA: Diagnosis not present

## 2020-07-22 DIAGNOSIS — N2581 Secondary hyperparathyroidism of renal origin: Secondary | ICD-10-CM | POA: Diagnosis not present

## 2020-07-22 DIAGNOSIS — D509 Iron deficiency anemia, unspecified: Secondary | ICD-10-CM | POA: Diagnosis not present

## 2020-07-22 DIAGNOSIS — D631 Anemia in chronic kidney disease: Secondary | ICD-10-CM | POA: Diagnosis not present

## 2020-07-22 DIAGNOSIS — E119 Type 2 diabetes mellitus without complications: Secondary | ICD-10-CM | POA: Diagnosis not present

## 2020-07-22 DIAGNOSIS — N186 End stage renal disease: Secondary | ICD-10-CM | POA: Diagnosis not present

## 2020-07-23 DIAGNOSIS — N2581 Secondary hyperparathyroidism of renal origin: Secondary | ICD-10-CM | POA: Diagnosis not present

## 2020-07-23 DIAGNOSIS — D509 Iron deficiency anemia, unspecified: Secondary | ICD-10-CM | POA: Diagnosis not present

## 2020-07-23 DIAGNOSIS — D631 Anemia in chronic kidney disease: Secondary | ICD-10-CM | POA: Diagnosis not present

## 2020-07-23 DIAGNOSIS — N186 End stage renal disease: Secondary | ICD-10-CM | POA: Diagnosis not present

## 2020-07-23 DIAGNOSIS — Z992 Dependence on renal dialysis: Secondary | ICD-10-CM | POA: Diagnosis not present

## 2020-07-23 DIAGNOSIS — E119 Type 2 diabetes mellitus without complications: Secondary | ICD-10-CM | POA: Diagnosis not present

## 2020-07-27 DIAGNOSIS — D631 Anemia in chronic kidney disease: Secondary | ICD-10-CM | POA: Diagnosis not present

## 2020-07-27 DIAGNOSIS — E1129 Type 2 diabetes mellitus with other diabetic kidney complication: Secondary | ICD-10-CM | POA: Diagnosis not present

## 2020-07-27 DIAGNOSIS — Z992 Dependence on renal dialysis: Secondary | ICD-10-CM | POA: Diagnosis not present

## 2020-07-27 DIAGNOSIS — E119 Type 2 diabetes mellitus without complications: Secondary | ICD-10-CM | POA: Diagnosis not present

## 2020-07-27 DIAGNOSIS — N2581 Secondary hyperparathyroidism of renal origin: Secondary | ICD-10-CM | POA: Diagnosis not present

## 2020-07-27 DIAGNOSIS — D509 Iron deficiency anemia, unspecified: Secondary | ICD-10-CM | POA: Diagnosis not present

## 2020-07-27 DIAGNOSIS — N186 End stage renal disease: Secondary | ICD-10-CM | POA: Diagnosis not present

## 2020-07-29 DIAGNOSIS — N186 End stage renal disease: Secondary | ICD-10-CM | POA: Diagnosis not present

## 2020-07-29 DIAGNOSIS — D509 Iron deficiency anemia, unspecified: Secondary | ICD-10-CM | POA: Diagnosis not present

## 2020-07-29 DIAGNOSIS — N2581 Secondary hyperparathyroidism of renal origin: Secondary | ICD-10-CM | POA: Diagnosis not present

## 2020-07-29 DIAGNOSIS — Z992 Dependence on renal dialysis: Secondary | ICD-10-CM | POA: Diagnosis not present

## 2020-07-29 DIAGNOSIS — D631 Anemia in chronic kidney disease: Secondary | ICD-10-CM | POA: Diagnosis not present

## 2020-07-29 DIAGNOSIS — E119 Type 2 diabetes mellitus without complications: Secondary | ICD-10-CM | POA: Diagnosis not present

## 2020-08-01 DIAGNOSIS — D509 Iron deficiency anemia, unspecified: Secondary | ICD-10-CM | POA: Diagnosis not present

## 2020-08-01 DIAGNOSIS — N186 End stage renal disease: Secondary | ICD-10-CM | POA: Diagnosis not present

## 2020-08-01 DIAGNOSIS — D631 Anemia in chronic kidney disease: Secondary | ICD-10-CM | POA: Diagnosis not present

## 2020-08-01 DIAGNOSIS — N2581 Secondary hyperparathyroidism of renal origin: Secondary | ICD-10-CM | POA: Diagnosis not present

## 2020-08-01 DIAGNOSIS — Z992 Dependence on renal dialysis: Secondary | ICD-10-CM | POA: Diagnosis not present

## 2020-08-01 DIAGNOSIS — E119 Type 2 diabetes mellitus without complications: Secondary | ICD-10-CM | POA: Diagnosis not present

## 2020-08-03 DIAGNOSIS — Z992 Dependence on renal dialysis: Secondary | ICD-10-CM | POA: Diagnosis not present

## 2020-08-03 DIAGNOSIS — N186 End stage renal disease: Secondary | ICD-10-CM | POA: Diagnosis not present

## 2020-08-03 DIAGNOSIS — D631 Anemia in chronic kidney disease: Secondary | ICD-10-CM | POA: Diagnosis not present

## 2020-08-03 DIAGNOSIS — N2581 Secondary hyperparathyroidism of renal origin: Secondary | ICD-10-CM | POA: Diagnosis not present

## 2020-08-03 DIAGNOSIS — E119 Type 2 diabetes mellitus without complications: Secondary | ICD-10-CM | POA: Diagnosis not present

## 2020-08-03 DIAGNOSIS — D509 Iron deficiency anemia, unspecified: Secondary | ICD-10-CM | POA: Diagnosis not present

## 2020-08-05 DIAGNOSIS — Z992 Dependence on renal dialysis: Secondary | ICD-10-CM | POA: Diagnosis not present

## 2020-08-05 DIAGNOSIS — D509 Iron deficiency anemia, unspecified: Secondary | ICD-10-CM | POA: Diagnosis not present

## 2020-08-05 DIAGNOSIS — N186 End stage renal disease: Secondary | ICD-10-CM | POA: Diagnosis not present

## 2020-08-05 DIAGNOSIS — D631 Anemia in chronic kidney disease: Secondary | ICD-10-CM | POA: Diagnosis not present

## 2020-08-05 DIAGNOSIS — N2581 Secondary hyperparathyroidism of renal origin: Secondary | ICD-10-CM | POA: Diagnosis not present

## 2020-08-05 DIAGNOSIS — E119 Type 2 diabetes mellitus without complications: Secondary | ICD-10-CM | POA: Diagnosis not present

## 2020-08-08 DIAGNOSIS — E119 Type 2 diabetes mellitus without complications: Secondary | ICD-10-CM | POA: Diagnosis not present

## 2020-08-08 DIAGNOSIS — D631 Anemia in chronic kidney disease: Secondary | ICD-10-CM | POA: Diagnosis not present

## 2020-08-08 DIAGNOSIS — N2581 Secondary hyperparathyroidism of renal origin: Secondary | ICD-10-CM | POA: Diagnosis not present

## 2020-08-08 DIAGNOSIS — D509 Iron deficiency anemia, unspecified: Secondary | ICD-10-CM | POA: Diagnosis not present

## 2020-08-08 DIAGNOSIS — N186 End stage renal disease: Secondary | ICD-10-CM | POA: Diagnosis not present

## 2020-08-08 DIAGNOSIS — Z992 Dependence on renal dialysis: Secondary | ICD-10-CM | POA: Diagnosis not present

## 2020-08-09 DIAGNOSIS — N186 End stage renal disease: Secondary | ICD-10-CM | POA: Diagnosis not present

## 2020-08-09 DIAGNOSIS — I120 Hypertensive chronic kidney disease with stage 5 chronic kidney disease or end stage renal disease: Secondary | ICD-10-CM | POA: Diagnosis not present

## 2020-08-09 DIAGNOSIS — Z992 Dependence on renal dialysis: Secondary | ICD-10-CM | POA: Diagnosis not present

## 2020-08-10 DIAGNOSIS — Z992 Dependence on renal dialysis: Secondary | ICD-10-CM | POA: Diagnosis not present

## 2020-08-10 DIAGNOSIS — D631 Anemia in chronic kidney disease: Secondary | ICD-10-CM | POA: Diagnosis not present

## 2020-08-10 DIAGNOSIS — N2581 Secondary hyperparathyroidism of renal origin: Secondary | ICD-10-CM | POA: Diagnosis not present

## 2020-08-10 DIAGNOSIS — E119 Type 2 diabetes mellitus without complications: Secondary | ICD-10-CM | POA: Diagnosis not present

## 2020-08-10 DIAGNOSIS — D509 Iron deficiency anemia, unspecified: Secondary | ICD-10-CM | POA: Diagnosis not present

## 2020-08-10 DIAGNOSIS — N186 End stage renal disease: Secondary | ICD-10-CM | POA: Diagnosis not present

## 2020-08-12 DIAGNOSIS — N2581 Secondary hyperparathyroidism of renal origin: Secondary | ICD-10-CM | POA: Diagnosis not present

## 2020-08-12 DIAGNOSIS — D631 Anemia in chronic kidney disease: Secondary | ICD-10-CM | POA: Diagnosis not present

## 2020-08-12 DIAGNOSIS — E119 Type 2 diabetes mellitus without complications: Secondary | ICD-10-CM | POA: Diagnosis not present

## 2020-08-12 DIAGNOSIS — Z992 Dependence on renal dialysis: Secondary | ICD-10-CM | POA: Diagnosis not present

## 2020-08-12 DIAGNOSIS — N186 End stage renal disease: Secondary | ICD-10-CM | POA: Diagnosis not present

## 2020-08-12 DIAGNOSIS — D509 Iron deficiency anemia, unspecified: Secondary | ICD-10-CM | POA: Diagnosis not present

## 2020-08-15 DIAGNOSIS — Z992 Dependence on renal dialysis: Secondary | ICD-10-CM | POA: Diagnosis not present

## 2020-08-15 DIAGNOSIS — E119 Type 2 diabetes mellitus without complications: Secondary | ICD-10-CM | POA: Diagnosis not present

## 2020-08-15 DIAGNOSIS — N2581 Secondary hyperparathyroidism of renal origin: Secondary | ICD-10-CM | POA: Diagnosis not present

## 2020-08-15 DIAGNOSIS — D509 Iron deficiency anemia, unspecified: Secondary | ICD-10-CM | POA: Diagnosis not present

## 2020-08-15 DIAGNOSIS — N186 End stage renal disease: Secondary | ICD-10-CM | POA: Diagnosis not present

## 2020-08-15 DIAGNOSIS — D631 Anemia in chronic kidney disease: Secondary | ICD-10-CM | POA: Diagnosis not present

## 2020-08-17 DIAGNOSIS — D631 Anemia in chronic kidney disease: Secondary | ICD-10-CM | POA: Diagnosis not present

## 2020-08-17 DIAGNOSIS — Z992 Dependence on renal dialysis: Secondary | ICD-10-CM | POA: Diagnosis not present

## 2020-08-17 DIAGNOSIS — E119 Type 2 diabetes mellitus without complications: Secondary | ICD-10-CM | POA: Diagnosis not present

## 2020-08-17 DIAGNOSIS — N2581 Secondary hyperparathyroidism of renal origin: Secondary | ICD-10-CM | POA: Diagnosis not present

## 2020-08-17 DIAGNOSIS — D509 Iron deficiency anemia, unspecified: Secondary | ICD-10-CM | POA: Diagnosis not present

## 2020-08-17 DIAGNOSIS — N186 End stage renal disease: Secondary | ICD-10-CM | POA: Diagnosis not present

## 2020-08-19 DIAGNOSIS — D509 Iron deficiency anemia, unspecified: Secondary | ICD-10-CM | POA: Diagnosis not present

## 2020-08-19 DIAGNOSIS — N2581 Secondary hyperparathyroidism of renal origin: Secondary | ICD-10-CM | POA: Diagnosis not present

## 2020-08-19 DIAGNOSIS — Z992 Dependence on renal dialysis: Secondary | ICD-10-CM | POA: Diagnosis not present

## 2020-08-19 DIAGNOSIS — E119 Type 2 diabetes mellitus without complications: Secondary | ICD-10-CM | POA: Diagnosis not present

## 2020-08-19 DIAGNOSIS — N186 End stage renal disease: Secondary | ICD-10-CM | POA: Diagnosis not present

## 2020-08-19 DIAGNOSIS — D631 Anemia in chronic kidney disease: Secondary | ICD-10-CM | POA: Diagnosis not present

## 2020-08-22 DIAGNOSIS — N2581 Secondary hyperparathyroidism of renal origin: Secondary | ICD-10-CM | POA: Diagnosis not present

## 2020-08-22 DIAGNOSIS — Z992 Dependence on renal dialysis: Secondary | ICD-10-CM | POA: Diagnosis not present

## 2020-08-22 DIAGNOSIS — D631 Anemia in chronic kidney disease: Secondary | ICD-10-CM | POA: Diagnosis not present

## 2020-08-22 DIAGNOSIS — D509 Iron deficiency anemia, unspecified: Secondary | ICD-10-CM | POA: Diagnosis not present

## 2020-08-22 DIAGNOSIS — E119 Type 2 diabetes mellitus without complications: Secondary | ICD-10-CM | POA: Diagnosis not present

## 2020-08-22 DIAGNOSIS — N186 End stage renal disease: Secondary | ICD-10-CM | POA: Diagnosis not present

## 2020-08-24 DIAGNOSIS — Z992 Dependence on renal dialysis: Secondary | ICD-10-CM | POA: Diagnosis not present

## 2020-08-24 DIAGNOSIS — D631 Anemia in chronic kidney disease: Secondary | ICD-10-CM | POA: Diagnosis not present

## 2020-08-24 DIAGNOSIS — E119 Type 2 diabetes mellitus without complications: Secondary | ICD-10-CM | POA: Diagnosis not present

## 2020-08-24 DIAGNOSIS — D509 Iron deficiency anemia, unspecified: Secondary | ICD-10-CM | POA: Diagnosis not present

## 2020-08-24 DIAGNOSIS — N2581 Secondary hyperparathyroidism of renal origin: Secondary | ICD-10-CM | POA: Diagnosis not present

## 2020-08-24 DIAGNOSIS — N186 End stage renal disease: Secondary | ICD-10-CM | POA: Diagnosis not present

## 2020-08-26 DIAGNOSIS — E119 Type 2 diabetes mellitus without complications: Secondary | ICD-10-CM | POA: Diagnosis not present

## 2020-08-26 DIAGNOSIS — D631 Anemia in chronic kidney disease: Secondary | ICD-10-CM | POA: Diagnosis not present

## 2020-08-26 DIAGNOSIS — N2581 Secondary hyperparathyroidism of renal origin: Secondary | ICD-10-CM | POA: Diagnosis not present

## 2020-08-26 DIAGNOSIS — Z992 Dependence on renal dialysis: Secondary | ICD-10-CM | POA: Diagnosis not present

## 2020-08-26 DIAGNOSIS — D509 Iron deficiency anemia, unspecified: Secondary | ICD-10-CM | POA: Diagnosis not present

## 2020-08-26 DIAGNOSIS — N186 End stage renal disease: Secondary | ICD-10-CM | POA: Diagnosis not present

## 2020-08-29 DIAGNOSIS — N2581 Secondary hyperparathyroidism of renal origin: Secondary | ICD-10-CM | POA: Diagnosis not present

## 2020-08-29 DIAGNOSIS — Z992 Dependence on renal dialysis: Secondary | ICD-10-CM | POA: Diagnosis not present

## 2020-08-29 DIAGNOSIS — E119 Type 2 diabetes mellitus without complications: Secondary | ICD-10-CM | POA: Diagnosis not present

## 2020-08-29 DIAGNOSIS — N186 End stage renal disease: Secondary | ICD-10-CM | POA: Diagnosis not present

## 2020-08-29 DIAGNOSIS — D631 Anemia in chronic kidney disease: Secondary | ICD-10-CM | POA: Diagnosis not present

## 2020-08-29 DIAGNOSIS — D509 Iron deficiency anemia, unspecified: Secondary | ICD-10-CM | POA: Diagnosis not present

## 2020-08-31 DIAGNOSIS — N186 End stage renal disease: Secondary | ICD-10-CM | POA: Diagnosis not present

## 2020-08-31 DIAGNOSIS — D631 Anemia in chronic kidney disease: Secondary | ICD-10-CM | POA: Diagnosis not present

## 2020-08-31 DIAGNOSIS — E119 Type 2 diabetes mellitus without complications: Secondary | ICD-10-CM | POA: Diagnosis not present

## 2020-08-31 DIAGNOSIS — D509 Iron deficiency anemia, unspecified: Secondary | ICD-10-CM | POA: Diagnosis not present

## 2020-08-31 DIAGNOSIS — Z992 Dependence on renal dialysis: Secondary | ICD-10-CM | POA: Diagnosis not present

## 2020-08-31 DIAGNOSIS — N2581 Secondary hyperparathyroidism of renal origin: Secondary | ICD-10-CM | POA: Diagnosis not present

## 2020-09-02 DIAGNOSIS — Z992 Dependence on renal dialysis: Secondary | ICD-10-CM | POA: Diagnosis not present

## 2020-09-02 DIAGNOSIS — D509 Iron deficiency anemia, unspecified: Secondary | ICD-10-CM | POA: Diagnosis not present

## 2020-09-02 DIAGNOSIS — N186 End stage renal disease: Secondary | ICD-10-CM | POA: Diagnosis not present

## 2020-09-02 DIAGNOSIS — N2581 Secondary hyperparathyroidism of renal origin: Secondary | ICD-10-CM | POA: Diagnosis not present

## 2020-09-02 DIAGNOSIS — D631 Anemia in chronic kidney disease: Secondary | ICD-10-CM | POA: Diagnosis not present

## 2020-09-02 DIAGNOSIS — E119 Type 2 diabetes mellitus without complications: Secondary | ICD-10-CM | POA: Diagnosis not present

## 2020-09-05 DIAGNOSIS — N186 End stage renal disease: Secondary | ICD-10-CM | POA: Diagnosis not present

## 2020-09-05 DIAGNOSIS — E119 Type 2 diabetes mellitus without complications: Secondary | ICD-10-CM | POA: Diagnosis not present

## 2020-09-05 DIAGNOSIS — D509 Iron deficiency anemia, unspecified: Secondary | ICD-10-CM | POA: Diagnosis not present

## 2020-09-05 DIAGNOSIS — N2581 Secondary hyperparathyroidism of renal origin: Secondary | ICD-10-CM | POA: Diagnosis not present

## 2020-09-05 DIAGNOSIS — Z992 Dependence on renal dialysis: Secondary | ICD-10-CM | POA: Diagnosis not present

## 2020-09-05 DIAGNOSIS — D631 Anemia in chronic kidney disease: Secondary | ICD-10-CM | POA: Diagnosis not present

## 2020-09-06 DIAGNOSIS — I120 Hypertensive chronic kidney disease with stage 5 chronic kidney disease or end stage renal disease: Secondary | ICD-10-CM | POA: Diagnosis not present

## 2020-09-06 DIAGNOSIS — Z992 Dependence on renal dialysis: Secondary | ICD-10-CM | POA: Diagnosis not present

## 2020-09-06 DIAGNOSIS — N186 End stage renal disease: Secondary | ICD-10-CM | POA: Diagnosis not present

## 2020-09-07 DIAGNOSIS — Z992 Dependence on renal dialysis: Secondary | ICD-10-CM | POA: Diagnosis not present

## 2020-09-07 DIAGNOSIS — E119 Type 2 diabetes mellitus without complications: Secondary | ICD-10-CM | POA: Diagnosis not present

## 2020-09-07 DIAGNOSIS — N186 End stage renal disease: Secondary | ICD-10-CM | POA: Diagnosis not present

## 2020-09-07 DIAGNOSIS — D509 Iron deficiency anemia, unspecified: Secondary | ICD-10-CM | POA: Diagnosis not present

## 2020-09-07 DIAGNOSIS — N2581 Secondary hyperparathyroidism of renal origin: Secondary | ICD-10-CM | POA: Diagnosis not present

## 2020-09-07 DIAGNOSIS — D631 Anemia in chronic kidney disease: Secondary | ICD-10-CM | POA: Diagnosis not present

## 2020-09-09 DIAGNOSIS — E119 Type 2 diabetes mellitus without complications: Secondary | ICD-10-CM | POA: Diagnosis not present

## 2020-09-09 DIAGNOSIS — D509 Iron deficiency anemia, unspecified: Secondary | ICD-10-CM | POA: Diagnosis not present

## 2020-09-09 DIAGNOSIS — N186 End stage renal disease: Secondary | ICD-10-CM | POA: Diagnosis not present

## 2020-09-09 DIAGNOSIS — Z992 Dependence on renal dialysis: Secondary | ICD-10-CM | POA: Diagnosis not present

## 2020-09-09 DIAGNOSIS — D631 Anemia in chronic kidney disease: Secondary | ICD-10-CM | POA: Diagnosis not present

## 2020-09-09 DIAGNOSIS — N2581 Secondary hyperparathyroidism of renal origin: Secondary | ICD-10-CM | POA: Diagnosis not present

## 2020-09-12 DIAGNOSIS — E119 Type 2 diabetes mellitus without complications: Secondary | ICD-10-CM | POA: Diagnosis not present

## 2020-09-12 DIAGNOSIS — D509 Iron deficiency anemia, unspecified: Secondary | ICD-10-CM | POA: Diagnosis not present

## 2020-09-12 DIAGNOSIS — N2581 Secondary hyperparathyroidism of renal origin: Secondary | ICD-10-CM | POA: Diagnosis not present

## 2020-09-12 DIAGNOSIS — N186 End stage renal disease: Secondary | ICD-10-CM | POA: Diagnosis not present

## 2020-09-12 DIAGNOSIS — D631 Anemia in chronic kidney disease: Secondary | ICD-10-CM | POA: Diagnosis not present

## 2020-09-12 DIAGNOSIS — Z992 Dependence on renal dialysis: Secondary | ICD-10-CM | POA: Diagnosis not present

## 2020-09-13 ENCOUNTER — Other Ambulatory Visit: Payer: Self-pay

## 2020-09-13 ENCOUNTER — Ambulatory Visit (INDEPENDENT_AMBULATORY_CARE_PROVIDER_SITE_OTHER): Payer: Medicare Other | Admitting: Podiatry

## 2020-09-13 ENCOUNTER — Encounter: Payer: Self-pay | Admitting: Podiatry

## 2020-09-13 DIAGNOSIS — L600 Ingrowing nail: Secondary | ICD-10-CM | POA: Diagnosis not present

## 2020-09-13 DIAGNOSIS — M79674 Pain in right toe(s): Secondary | ICD-10-CM

## 2020-09-13 DIAGNOSIS — N186 End stage renal disease: Secondary | ICD-10-CM

## 2020-09-13 DIAGNOSIS — B351 Tinea unguium: Secondary | ICD-10-CM | POA: Diagnosis not present

## 2020-09-13 DIAGNOSIS — M2041 Other hammer toe(s) (acquired), right foot: Secondary | ICD-10-CM

## 2020-09-13 DIAGNOSIS — M2042 Other hammer toe(s) (acquired), left foot: Secondary | ICD-10-CM

## 2020-09-13 DIAGNOSIS — M79675 Pain in left toe(s): Secondary | ICD-10-CM | POA: Diagnosis not present

## 2020-09-13 DIAGNOSIS — Z992 Dependence on renal dialysis: Secondary | ICD-10-CM

## 2020-09-13 DIAGNOSIS — E1151 Type 2 diabetes mellitus with diabetic peripheral angiopathy without gangrene: Secondary | ICD-10-CM

## 2020-09-13 DIAGNOSIS — M2011 Hallux valgus (acquired), right foot: Secondary | ICD-10-CM

## 2020-09-13 NOTE — Progress Notes (Signed)
  Subjective:  Patient ID: Shawn [name], male    DOB: 1961/03/22,  MRN: [id number]  60 y.o. male presents with at risk foot care with h/o NIDDM with ESRD on hemodialysis and painful thick toenails that are difficult to trim. Pain interferes with ambulation. Aggravating factors include wearing enclosed shoe gear. Pain is relieved with periodic professional debridement.   Patient's wife is present during today's visit.   Patient's blood glucose was 134 mg/dl this morning. He states last A1c was 7.5%.  PCP: Vincente Liberty, MD and last visit was: 02/16/2020. He is on renal dialysis.  Patient states the dialysis nurse told him to have his great toes checked.  Review of Systems: Negative except as noted in the HPI.   No Known Allergies   Social History   Tobacco Use  Smoking Status Never Smoker  Smokeless Tobacco Never Used    Objective:  There were no vitals filed for this visit. Constitutional Patient is a pleasant 60 y.o. African American male WD, WN in NAD. AAO x 3.  Vascular Capillary fill time to digits <3 seconds b/l lower extremities. Nonpalpable DP pulse(s) b/l lower extremities. Nonpalpable PT pulse(s) b/l lower extremities. Pedal hair absent. Lower extremity skin temperature gradient warm to cool. No pain with calf compression b/l. No cyanosis or clubbing noted.  Neurologic Normal speech. Protective sensation diminished with 10g monofilament b/l.  Dermatologic Pedal skin with normal turgor, texture and tone bilaterally. No open wounds bilaterally. No interdigital macerations bilaterally. Toenails 1-5 b/l elongated, discolored, dystrophic, thickened, crumbly with subungual debris and tenderness to dorsal palpation. Incurvated nailplate b/l border(s) L hallux and R hallux.  Nail border hypertrophy present. There is tenderness to palpation. Sign(s) of infection: no clinical signs of infection noted on examination today.Marland Kitchen He does have pressure spots on medial nailfolds from  the offending nailplates. No purulence noted.  Orthopedic: Normal muscle strength 5/5 to all lower extremity muscle groups bilaterally. Hallux valgus with bunion deformity noted b/l lower extremities. Hammertoes noted to the L 2nd toe and R 2nd toe.    Assessment:   1. Pain due to onychomycosis of toenails of both feet   2. Ingrown toenail without infection   3. Hallux valgus, acquired, bilateral   4. Acquired hammertoes of both feet   5. ESRD on dialysis ([place])   6. Type II diabetes mellitus with peripheral circulatory disorder [facility])    Plan:  Patient was evaluated and treated and all questions answered.  Onychomycosis with pain -Nails palliatively debridement as below. -Educated on self-care  Procedure: Nail Debridement Rationale: Pain Type of Debridement: manual, sharp debridement. Instrumentation: Nail nipper, rotary burr. Number of Nails: 10  -Examined patient. -Continue diabetic foot care principles. -Patient to continue soft, supportive shoe gear daily. -Toenails 1-5 b/l were debrided in length and girth with sterile nail nippers and dremel without iatrogenic bleeding.  -Offending nail border debrided and curretaged L hallux and R hallux utilizing sterile nail nipper and currette. Border(s) cleansed with alcohol and triple antibiotic ointment applied. Wife instructed to apply antibiotic ointment to both great toes once daily for 14 days.Call office if there are any concerns. -Patient to report any pedal injuries to medical professional immediately. -Patient/POA to call should there be question/concern in the interim.  Return in about 10 weeks (around 11/22/2020).  Marzetta Board, DPM

## 2020-09-14 DIAGNOSIS — E119 Type 2 diabetes mellitus without complications: Secondary | ICD-10-CM | POA: Diagnosis not present

## 2020-09-14 DIAGNOSIS — D631 Anemia in chronic kidney disease: Secondary | ICD-10-CM | POA: Diagnosis not present

## 2020-09-14 DIAGNOSIS — Z992 Dependence on renal dialysis: Secondary | ICD-10-CM | POA: Diagnosis not present

## 2020-09-14 DIAGNOSIS — N2581 Secondary hyperparathyroidism of renal origin: Secondary | ICD-10-CM | POA: Diagnosis not present

## 2020-09-14 DIAGNOSIS — N186 End stage renal disease: Secondary | ICD-10-CM | POA: Diagnosis not present

## 2020-09-14 DIAGNOSIS — D509 Iron deficiency anemia, unspecified: Secondary | ICD-10-CM | POA: Diagnosis not present

## 2020-09-16 DIAGNOSIS — D631 Anemia in chronic kidney disease: Secondary | ICD-10-CM | POA: Diagnosis not present

## 2020-09-16 DIAGNOSIS — N186 End stage renal disease: Secondary | ICD-10-CM | POA: Diagnosis not present

## 2020-09-16 DIAGNOSIS — N2581 Secondary hyperparathyroidism of renal origin: Secondary | ICD-10-CM | POA: Diagnosis not present

## 2020-09-16 DIAGNOSIS — Z992 Dependence on renal dialysis: Secondary | ICD-10-CM | POA: Diagnosis not present

## 2020-09-16 DIAGNOSIS — E119 Type 2 diabetes mellitus without complications: Secondary | ICD-10-CM | POA: Diagnosis not present

## 2020-09-16 DIAGNOSIS — D509 Iron deficiency anemia, unspecified: Secondary | ICD-10-CM | POA: Diagnosis not present

## 2020-09-19 DIAGNOSIS — D509 Iron deficiency anemia, unspecified: Secondary | ICD-10-CM | POA: Diagnosis not present

## 2020-09-19 DIAGNOSIS — N186 End stage renal disease: Secondary | ICD-10-CM | POA: Diagnosis not present

## 2020-09-19 DIAGNOSIS — D631 Anemia in chronic kidney disease: Secondary | ICD-10-CM | POA: Diagnosis not present

## 2020-09-19 DIAGNOSIS — N2581 Secondary hyperparathyroidism of renal origin: Secondary | ICD-10-CM | POA: Diagnosis not present

## 2020-09-19 DIAGNOSIS — E119 Type 2 diabetes mellitus without complications: Secondary | ICD-10-CM | POA: Diagnosis not present

## 2020-09-19 DIAGNOSIS — Z992 Dependence on renal dialysis: Secondary | ICD-10-CM | POA: Diagnosis not present

## 2020-09-21 DIAGNOSIS — D509 Iron deficiency anemia, unspecified: Secondary | ICD-10-CM | POA: Diagnosis not present

## 2020-09-21 DIAGNOSIS — Z992 Dependence on renal dialysis: Secondary | ICD-10-CM | POA: Diagnosis not present

## 2020-09-21 DIAGNOSIS — D631 Anemia in chronic kidney disease: Secondary | ICD-10-CM | POA: Diagnosis not present

## 2020-09-21 DIAGNOSIS — N2581 Secondary hyperparathyroidism of renal origin: Secondary | ICD-10-CM | POA: Diagnosis not present

## 2020-09-21 DIAGNOSIS — E119 Type 2 diabetes mellitus without complications: Secondary | ICD-10-CM | POA: Diagnosis not present

## 2020-09-21 DIAGNOSIS — N186 End stage renal disease: Secondary | ICD-10-CM | POA: Diagnosis not present

## 2020-09-23 DIAGNOSIS — D509 Iron deficiency anemia, unspecified: Secondary | ICD-10-CM | POA: Diagnosis not present

## 2020-09-23 DIAGNOSIS — N2581 Secondary hyperparathyroidism of renal origin: Secondary | ICD-10-CM | POA: Diagnosis not present

## 2020-09-23 DIAGNOSIS — E119 Type 2 diabetes mellitus without complications: Secondary | ICD-10-CM | POA: Diagnosis not present

## 2020-09-23 DIAGNOSIS — Z992 Dependence on renal dialysis: Secondary | ICD-10-CM | POA: Diagnosis not present

## 2020-09-23 DIAGNOSIS — D631 Anemia in chronic kidney disease: Secondary | ICD-10-CM | POA: Diagnosis not present

## 2020-09-23 DIAGNOSIS — N186 End stage renal disease: Secondary | ICD-10-CM | POA: Diagnosis not present

## 2020-09-26 DIAGNOSIS — E119 Type 2 diabetes mellitus without complications: Secondary | ICD-10-CM | POA: Diagnosis not present

## 2020-09-26 DIAGNOSIS — N186 End stage renal disease: Secondary | ICD-10-CM | POA: Diagnosis not present

## 2020-09-26 DIAGNOSIS — D631 Anemia in chronic kidney disease: Secondary | ICD-10-CM | POA: Diagnosis not present

## 2020-09-26 DIAGNOSIS — Z992 Dependence on renal dialysis: Secondary | ICD-10-CM | POA: Diagnosis not present

## 2020-09-26 DIAGNOSIS — N2581 Secondary hyperparathyroidism of renal origin: Secondary | ICD-10-CM | POA: Diagnosis not present

## 2020-09-26 DIAGNOSIS — D509 Iron deficiency anemia, unspecified: Secondary | ICD-10-CM | POA: Diagnosis not present

## 2020-09-28 DIAGNOSIS — N186 End stage renal disease: Secondary | ICD-10-CM | POA: Diagnosis not present

## 2020-09-28 DIAGNOSIS — N2581 Secondary hyperparathyroidism of renal origin: Secondary | ICD-10-CM | POA: Diagnosis not present

## 2020-09-28 DIAGNOSIS — E119 Type 2 diabetes mellitus without complications: Secondary | ICD-10-CM | POA: Diagnosis not present

## 2020-09-28 DIAGNOSIS — Z992 Dependence on renal dialysis: Secondary | ICD-10-CM | POA: Diagnosis not present

## 2020-09-28 DIAGNOSIS — D509 Iron deficiency anemia, unspecified: Secondary | ICD-10-CM | POA: Diagnosis not present

## 2020-09-28 DIAGNOSIS — D631 Anemia in chronic kidney disease: Secondary | ICD-10-CM | POA: Diagnosis not present

## 2020-09-30 DIAGNOSIS — E119 Type 2 diabetes mellitus without complications: Secondary | ICD-10-CM | POA: Diagnosis not present

## 2020-09-30 DIAGNOSIS — N2581 Secondary hyperparathyroidism of renal origin: Secondary | ICD-10-CM | POA: Diagnosis not present

## 2020-09-30 DIAGNOSIS — Z992 Dependence on renal dialysis: Secondary | ICD-10-CM | POA: Diagnosis not present

## 2020-09-30 DIAGNOSIS — D509 Iron deficiency anemia, unspecified: Secondary | ICD-10-CM | POA: Diagnosis not present

## 2020-09-30 DIAGNOSIS — D631 Anemia in chronic kidney disease: Secondary | ICD-10-CM | POA: Diagnosis not present

## 2020-09-30 DIAGNOSIS — N186 End stage renal disease: Secondary | ICD-10-CM | POA: Diagnosis not present

## 2020-10-03 DIAGNOSIS — N186 End stage renal disease: Secondary | ICD-10-CM | POA: Diagnosis not present

## 2020-10-03 DIAGNOSIS — D631 Anemia in chronic kidney disease: Secondary | ICD-10-CM | POA: Diagnosis not present

## 2020-10-03 DIAGNOSIS — D509 Iron deficiency anemia, unspecified: Secondary | ICD-10-CM | POA: Diagnosis not present

## 2020-10-03 DIAGNOSIS — Z992 Dependence on renal dialysis: Secondary | ICD-10-CM | POA: Diagnosis not present

## 2020-10-03 DIAGNOSIS — N2581 Secondary hyperparathyroidism of renal origin: Secondary | ICD-10-CM | POA: Diagnosis not present

## 2020-10-03 DIAGNOSIS — E119 Type 2 diabetes mellitus without complications: Secondary | ICD-10-CM | POA: Diagnosis not present

## 2020-10-05 DIAGNOSIS — Z992 Dependence on renal dialysis: Secondary | ICD-10-CM | POA: Diagnosis not present

## 2020-10-05 DIAGNOSIS — D509 Iron deficiency anemia, unspecified: Secondary | ICD-10-CM | POA: Diagnosis not present

## 2020-10-05 DIAGNOSIS — D631 Anemia in chronic kidney disease: Secondary | ICD-10-CM | POA: Diagnosis not present

## 2020-10-05 DIAGNOSIS — N186 End stage renal disease: Secondary | ICD-10-CM | POA: Diagnosis not present

## 2020-10-05 DIAGNOSIS — E119 Type 2 diabetes mellitus without complications: Secondary | ICD-10-CM | POA: Diagnosis not present

## 2020-10-05 DIAGNOSIS — N2581 Secondary hyperparathyroidism of renal origin: Secondary | ICD-10-CM | POA: Diagnosis not present

## 2020-10-06 DIAGNOSIS — D631 Anemia in chronic kidney disease: Secondary | ICD-10-CM | POA: Diagnosis not present

## 2020-10-06 DIAGNOSIS — Z992 Dependence on renal dialysis: Secondary | ICD-10-CM | POA: Diagnosis not present

## 2020-10-06 DIAGNOSIS — D509 Iron deficiency anemia, unspecified: Secondary | ICD-10-CM | POA: Diagnosis not present

## 2020-10-06 DIAGNOSIS — E119 Type 2 diabetes mellitus without complications: Secondary | ICD-10-CM | POA: Diagnosis not present

## 2020-10-06 DIAGNOSIS — N2581 Secondary hyperparathyroidism of renal origin: Secondary | ICD-10-CM | POA: Diagnosis not present

## 2020-10-06 DIAGNOSIS — N186 End stage renal disease: Secondary | ICD-10-CM | POA: Diagnosis not present

## 2020-10-07 DIAGNOSIS — I120 Hypertensive chronic kidney disease with stage 5 chronic kidney disease or end stage renal disease: Secondary | ICD-10-CM | POA: Diagnosis not present

## 2020-10-07 DIAGNOSIS — N186 End stage renal disease: Secondary | ICD-10-CM | POA: Diagnosis not present

## 2020-10-07 DIAGNOSIS — Z992 Dependence on renal dialysis: Secondary | ICD-10-CM | POA: Diagnosis not present

## 2020-10-10 DIAGNOSIS — E119 Type 2 diabetes mellitus without complications: Secondary | ICD-10-CM | POA: Diagnosis not present

## 2020-10-10 DIAGNOSIS — D631 Anemia in chronic kidney disease: Secondary | ICD-10-CM | POA: Diagnosis not present

## 2020-10-10 DIAGNOSIS — Z992 Dependence on renal dialysis: Secondary | ICD-10-CM | POA: Diagnosis not present

## 2020-10-10 DIAGNOSIS — N186 End stage renal disease: Secondary | ICD-10-CM | POA: Diagnosis not present

## 2020-10-10 DIAGNOSIS — N2581 Secondary hyperparathyroidism of renal origin: Secondary | ICD-10-CM | POA: Diagnosis not present

## 2020-10-10 DIAGNOSIS — D509 Iron deficiency anemia, unspecified: Secondary | ICD-10-CM | POA: Diagnosis not present

## 2020-10-12 DIAGNOSIS — N186 End stage renal disease: Secondary | ICD-10-CM | POA: Diagnosis not present

## 2020-10-12 DIAGNOSIS — Z992 Dependence on renal dialysis: Secondary | ICD-10-CM | POA: Diagnosis not present

## 2020-10-12 DIAGNOSIS — E119 Type 2 diabetes mellitus without complications: Secondary | ICD-10-CM | POA: Diagnosis not present

## 2020-10-12 DIAGNOSIS — N2581 Secondary hyperparathyroidism of renal origin: Secondary | ICD-10-CM | POA: Diagnosis not present

## 2020-10-12 DIAGNOSIS — D631 Anemia in chronic kidney disease: Secondary | ICD-10-CM | POA: Diagnosis not present

## 2020-10-12 DIAGNOSIS — D509 Iron deficiency anemia, unspecified: Secondary | ICD-10-CM | POA: Diagnosis not present

## 2020-10-14 DIAGNOSIS — N186 End stage renal disease: Secondary | ICD-10-CM | POA: Diagnosis not present

## 2020-10-14 DIAGNOSIS — N2581 Secondary hyperparathyroidism of renal origin: Secondary | ICD-10-CM | POA: Diagnosis not present

## 2020-10-14 DIAGNOSIS — Z992 Dependence on renal dialysis: Secondary | ICD-10-CM | POA: Diagnosis not present

## 2020-10-14 DIAGNOSIS — D509 Iron deficiency anemia, unspecified: Secondary | ICD-10-CM | POA: Diagnosis not present

## 2020-10-14 DIAGNOSIS — D631 Anemia in chronic kidney disease: Secondary | ICD-10-CM | POA: Diagnosis not present

## 2020-10-14 DIAGNOSIS — E119 Type 2 diabetes mellitus without complications: Secondary | ICD-10-CM | POA: Diagnosis not present

## 2020-10-17 DIAGNOSIS — N186 End stage renal disease: Secondary | ICD-10-CM | POA: Diagnosis not present

## 2020-10-17 DIAGNOSIS — D631 Anemia in chronic kidney disease: Secondary | ICD-10-CM | POA: Diagnosis not present

## 2020-10-17 DIAGNOSIS — D509 Iron deficiency anemia, unspecified: Secondary | ICD-10-CM | POA: Diagnosis not present

## 2020-10-17 DIAGNOSIS — N2581 Secondary hyperparathyroidism of renal origin: Secondary | ICD-10-CM | POA: Diagnosis not present

## 2020-10-17 DIAGNOSIS — Z992 Dependence on renal dialysis: Secondary | ICD-10-CM | POA: Diagnosis not present

## 2020-10-17 DIAGNOSIS — E119 Type 2 diabetes mellitus without complications: Secondary | ICD-10-CM | POA: Diagnosis not present

## 2020-10-18 DIAGNOSIS — I6990 Unspecified sequelae of unspecified cerebrovascular disease: Secondary | ICD-10-CM | POA: Diagnosis not present

## 2020-10-18 DIAGNOSIS — E1165 Type 2 diabetes mellitus with hyperglycemia: Secondary | ICD-10-CM | POA: Diagnosis not present

## 2020-10-18 DIAGNOSIS — I119 Hypertensive heart disease without heart failure: Secondary | ICD-10-CM | POA: Diagnosis not present

## 2020-10-18 DIAGNOSIS — E7800 Pure hypercholesterolemia, unspecified: Secondary | ICD-10-CM | POA: Diagnosis not present

## 2020-10-19 DIAGNOSIS — N186 End stage renal disease: Secondary | ICD-10-CM | POA: Diagnosis not present

## 2020-10-19 DIAGNOSIS — Z992 Dependence on renal dialysis: Secondary | ICD-10-CM | POA: Diagnosis not present

## 2020-10-19 DIAGNOSIS — E119 Type 2 diabetes mellitus without complications: Secondary | ICD-10-CM | POA: Diagnosis not present

## 2020-10-19 DIAGNOSIS — D631 Anemia in chronic kidney disease: Secondary | ICD-10-CM | POA: Diagnosis not present

## 2020-10-19 DIAGNOSIS — N2581 Secondary hyperparathyroidism of renal origin: Secondary | ICD-10-CM | POA: Diagnosis not present

## 2020-10-19 DIAGNOSIS — D509 Iron deficiency anemia, unspecified: Secondary | ICD-10-CM | POA: Diagnosis not present

## 2020-10-21 DIAGNOSIS — Z992 Dependence on renal dialysis: Secondary | ICD-10-CM | POA: Diagnosis not present

## 2020-10-21 DIAGNOSIS — D631 Anemia in chronic kidney disease: Secondary | ICD-10-CM | POA: Diagnosis not present

## 2020-10-21 DIAGNOSIS — E119 Type 2 diabetes mellitus without complications: Secondary | ICD-10-CM | POA: Diagnosis not present

## 2020-10-21 DIAGNOSIS — N2581 Secondary hyperparathyroidism of renal origin: Secondary | ICD-10-CM | POA: Diagnosis not present

## 2020-10-21 DIAGNOSIS — D509 Iron deficiency anemia, unspecified: Secondary | ICD-10-CM | POA: Diagnosis not present

## 2020-10-21 DIAGNOSIS — N186 End stage renal disease: Secondary | ICD-10-CM | POA: Diagnosis not present

## 2020-10-24 DIAGNOSIS — E119 Type 2 diabetes mellitus without complications: Secondary | ICD-10-CM | POA: Diagnosis not present

## 2020-10-24 DIAGNOSIS — D631 Anemia in chronic kidney disease: Secondary | ICD-10-CM | POA: Diagnosis not present

## 2020-10-24 DIAGNOSIS — N186 End stage renal disease: Secondary | ICD-10-CM | POA: Diagnosis not present

## 2020-10-24 DIAGNOSIS — Z992 Dependence on renal dialysis: Secondary | ICD-10-CM | POA: Diagnosis not present

## 2020-10-24 DIAGNOSIS — N2581 Secondary hyperparathyroidism of renal origin: Secondary | ICD-10-CM | POA: Diagnosis not present

## 2020-10-24 DIAGNOSIS — D509 Iron deficiency anemia, unspecified: Secondary | ICD-10-CM | POA: Diagnosis not present

## 2020-10-26 DIAGNOSIS — D631 Anemia in chronic kidney disease: Secondary | ICD-10-CM | POA: Diagnosis not present

## 2020-10-26 DIAGNOSIS — E119 Type 2 diabetes mellitus without complications: Secondary | ICD-10-CM | POA: Diagnosis not present

## 2020-10-26 DIAGNOSIS — N2581 Secondary hyperparathyroidism of renal origin: Secondary | ICD-10-CM | POA: Diagnosis not present

## 2020-10-26 DIAGNOSIS — N186 End stage renal disease: Secondary | ICD-10-CM | POA: Diagnosis not present

## 2020-10-26 DIAGNOSIS — E1129 Type 2 diabetes mellitus with other diabetic kidney complication: Secondary | ICD-10-CM | POA: Diagnosis not present

## 2020-10-26 DIAGNOSIS — D509 Iron deficiency anemia, unspecified: Secondary | ICD-10-CM | POA: Diagnosis not present

## 2020-10-26 DIAGNOSIS — Z992 Dependence on renal dialysis: Secondary | ICD-10-CM | POA: Diagnosis not present

## 2020-10-28 DIAGNOSIS — D509 Iron deficiency anemia, unspecified: Secondary | ICD-10-CM | POA: Diagnosis not present

## 2020-10-28 DIAGNOSIS — N186 End stage renal disease: Secondary | ICD-10-CM | POA: Diagnosis not present

## 2020-10-28 DIAGNOSIS — Z992 Dependence on renal dialysis: Secondary | ICD-10-CM | POA: Diagnosis not present

## 2020-10-28 DIAGNOSIS — E119 Type 2 diabetes mellitus without complications: Secondary | ICD-10-CM | POA: Diagnosis not present

## 2020-10-28 DIAGNOSIS — D631 Anemia in chronic kidney disease: Secondary | ICD-10-CM | POA: Diagnosis not present

## 2020-10-28 DIAGNOSIS — N2581 Secondary hyperparathyroidism of renal origin: Secondary | ICD-10-CM | POA: Diagnosis not present

## 2020-10-31 DIAGNOSIS — N2581 Secondary hyperparathyroidism of renal origin: Secondary | ICD-10-CM | POA: Diagnosis not present

## 2020-10-31 DIAGNOSIS — D509 Iron deficiency anemia, unspecified: Secondary | ICD-10-CM | POA: Diagnosis not present

## 2020-10-31 DIAGNOSIS — Z992 Dependence on renal dialysis: Secondary | ICD-10-CM | POA: Diagnosis not present

## 2020-10-31 DIAGNOSIS — D631 Anemia in chronic kidney disease: Secondary | ICD-10-CM | POA: Diagnosis not present

## 2020-10-31 DIAGNOSIS — N186 End stage renal disease: Secondary | ICD-10-CM | POA: Diagnosis not present

## 2020-10-31 DIAGNOSIS — E119 Type 2 diabetes mellitus without complications: Secondary | ICD-10-CM | POA: Diagnosis not present

## 2020-11-02 DIAGNOSIS — N186 End stage renal disease: Secondary | ICD-10-CM | POA: Diagnosis not present

## 2020-11-02 DIAGNOSIS — N2581 Secondary hyperparathyroidism of renal origin: Secondary | ICD-10-CM | POA: Diagnosis not present

## 2020-11-02 DIAGNOSIS — D509 Iron deficiency anemia, unspecified: Secondary | ICD-10-CM | POA: Diagnosis not present

## 2020-11-02 DIAGNOSIS — E119 Type 2 diabetes mellitus without complications: Secondary | ICD-10-CM | POA: Diagnosis not present

## 2020-11-02 DIAGNOSIS — D631 Anemia in chronic kidney disease: Secondary | ICD-10-CM | POA: Diagnosis not present

## 2020-11-02 DIAGNOSIS — Z992 Dependence on renal dialysis: Secondary | ICD-10-CM | POA: Diagnosis not present

## 2020-11-04 DIAGNOSIS — D509 Iron deficiency anemia, unspecified: Secondary | ICD-10-CM | POA: Diagnosis not present

## 2020-11-04 DIAGNOSIS — N2581 Secondary hyperparathyroidism of renal origin: Secondary | ICD-10-CM | POA: Diagnosis not present

## 2020-11-04 DIAGNOSIS — D631 Anemia in chronic kidney disease: Secondary | ICD-10-CM | POA: Diagnosis not present

## 2020-11-04 DIAGNOSIS — N186 End stage renal disease: Secondary | ICD-10-CM | POA: Diagnosis not present

## 2020-11-04 DIAGNOSIS — E119 Type 2 diabetes mellitus without complications: Secondary | ICD-10-CM | POA: Diagnosis not present

## 2020-11-04 DIAGNOSIS — Z992 Dependence on renal dialysis: Secondary | ICD-10-CM | POA: Diagnosis not present

## 2020-11-06 DIAGNOSIS — Z992 Dependence on renal dialysis: Secondary | ICD-10-CM | POA: Diagnosis not present

## 2020-11-06 DIAGNOSIS — N186 End stage renal disease: Secondary | ICD-10-CM | POA: Diagnosis not present

## 2020-11-06 DIAGNOSIS — I120 Hypertensive chronic kidney disease with stage 5 chronic kidney disease or end stage renal disease: Secondary | ICD-10-CM | POA: Diagnosis not present

## 2020-11-07 DIAGNOSIS — Z992 Dependence on renal dialysis: Secondary | ICD-10-CM | POA: Diagnosis not present

## 2020-11-07 DIAGNOSIS — N2581 Secondary hyperparathyroidism of renal origin: Secondary | ICD-10-CM | POA: Diagnosis not present

## 2020-11-07 DIAGNOSIS — E119 Type 2 diabetes mellitus without complications: Secondary | ICD-10-CM | POA: Diagnosis not present

## 2020-11-07 DIAGNOSIS — D631 Anemia in chronic kidney disease: Secondary | ICD-10-CM | POA: Diagnosis not present

## 2020-11-07 DIAGNOSIS — N186 End stage renal disease: Secondary | ICD-10-CM | POA: Diagnosis not present

## 2020-11-07 DIAGNOSIS — D509 Iron deficiency anemia, unspecified: Secondary | ICD-10-CM | POA: Diagnosis not present

## 2020-11-09 DIAGNOSIS — N2581 Secondary hyperparathyroidism of renal origin: Secondary | ICD-10-CM | POA: Diagnosis not present

## 2020-11-09 DIAGNOSIS — D631 Anemia in chronic kidney disease: Secondary | ICD-10-CM | POA: Diagnosis not present

## 2020-11-09 DIAGNOSIS — D509 Iron deficiency anemia, unspecified: Secondary | ICD-10-CM | POA: Diagnosis not present

## 2020-11-09 DIAGNOSIS — E119 Type 2 diabetes mellitus without complications: Secondary | ICD-10-CM | POA: Diagnosis not present

## 2020-11-09 DIAGNOSIS — N186 End stage renal disease: Secondary | ICD-10-CM | POA: Diagnosis not present

## 2020-11-09 DIAGNOSIS — Z992 Dependence on renal dialysis: Secondary | ICD-10-CM | POA: Diagnosis not present

## 2020-11-11 DIAGNOSIS — D509 Iron deficiency anemia, unspecified: Secondary | ICD-10-CM | POA: Diagnosis not present

## 2020-11-11 DIAGNOSIS — D631 Anemia in chronic kidney disease: Secondary | ICD-10-CM | POA: Diagnosis not present

## 2020-11-11 DIAGNOSIS — N2581 Secondary hyperparathyroidism of renal origin: Secondary | ICD-10-CM | POA: Diagnosis not present

## 2020-11-11 DIAGNOSIS — E119 Type 2 diabetes mellitus without complications: Secondary | ICD-10-CM | POA: Diagnosis not present

## 2020-11-11 DIAGNOSIS — N186 End stage renal disease: Secondary | ICD-10-CM | POA: Diagnosis not present

## 2020-11-11 DIAGNOSIS — Z992 Dependence on renal dialysis: Secondary | ICD-10-CM | POA: Diagnosis not present

## 2020-11-14 DIAGNOSIS — E119 Type 2 diabetes mellitus without complications: Secondary | ICD-10-CM | POA: Diagnosis not present

## 2020-11-14 DIAGNOSIS — N2581 Secondary hyperparathyroidism of renal origin: Secondary | ICD-10-CM | POA: Diagnosis not present

## 2020-11-14 DIAGNOSIS — D631 Anemia in chronic kidney disease: Secondary | ICD-10-CM | POA: Diagnosis not present

## 2020-11-14 DIAGNOSIS — N186 End stage renal disease: Secondary | ICD-10-CM | POA: Diagnosis not present

## 2020-11-14 DIAGNOSIS — D509 Iron deficiency anemia, unspecified: Secondary | ICD-10-CM | POA: Diagnosis not present

## 2020-11-14 DIAGNOSIS — Z992 Dependence on renal dialysis: Secondary | ICD-10-CM | POA: Diagnosis not present

## 2020-11-16 DIAGNOSIS — N2581 Secondary hyperparathyroidism of renal origin: Secondary | ICD-10-CM | POA: Diagnosis not present

## 2020-11-16 DIAGNOSIS — E119 Type 2 diabetes mellitus without complications: Secondary | ICD-10-CM | POA: Diagnosis not present

## 2020-11-16 DIAGNOSIS — N186 End stage renal disease: Secondary | ICD-10-CM | POA: Diagnosis not present

## 2020-11-16 DIAGNOSIS — D631 Anemia in chronic kidney disease: Secondary | ICD-10-CM | POA: Diagnosis not present

## 2020-11-16 DIAGNOSIS — Z992 Dependence on renal dialysis: Secondary | ICD-10-CM | POA: Diagnosis not present

## 2020-11-16 DIAGNOSIS — D509 Iron deficiency anemia, unspecified: Secondary | ICD-10-CM | POA: Diagnosis not present

## 2020-11-18 DIAGNOSIS — Z992 Dependence on renal dialysis: Secondary | ICD-10-CM | POA: Diagnosis not present

## 2020-11-18 DIAGNOSIS — N186 End stage renal disease: Secondary | ICD-10-CM | POA: Diagnosis not present

## 2020-11-18 DIAGNOSIS — D631 Anemia in chronic kidney disease: Secondary | ICD-10-CM | POA: Diagnosis not present

## 2020-11-18 DIAGNOSIS — D509 Iron deficiency anemia, unspecified: Secondary | ICD-10-CM | POA: Diagnosis not present

## 2020-11-18 DIAGNOSIS — N2581 Secondary hyperparathyroidism of renal origin: Secondary | ICD-10-CM | POA: Diagnosis not present

## 2020-11-18 DIAGNOSIS — E119 Type 2 diabetes mellitus without complications: Secondary | ICD-10-CM | POA: Diagnosis not present

## 2020-11-21 DIAGNOSIS — Z992 Dependence on renal dialysis: Secondary | ICD-10-CM | POA: Diagnosis not present

## 2020-11-21 DIAGNOSIS — D509 Iron deficiency anemia, unspecified: Secondary | ICD-10-CM | POA: Diagnosis not present

## 2020-11-21 DIAGNOSIS — N2581 Secondary hyperparathyroidism of renal origin: Secondary | ICD-10-CM | POA: Diagnosis not present

## 2020-11-21 DIAGNOSIS — D631 Anemia in chronic kidney disease: Secondary | ICD-10-CM | POA: Diagnosis not present

## 2020-11-21 DIAGNOSIS — N186 End stage renal disease: Secondary | ICD-10-CM | POA: Diagnosis not present

## 2020-11-21 DIAGNOSIS — E119 Type 2 diabetes mellitus without complications: Secondary | ICD-10-CM | POA: Diagnosis not present

## 2020-11-22 ENCOUNTER — Other Ambulatory Visit: Payer: Self-pay

## 2020-11-22 ENCOUNTER — Ambulatory Visit (INDEPENDENT_AMBULATORY_CARE_PROVIDER_SITE_OTHER): Payer: Medicare Other | Admitting: Podiatry

## 2020-11-22 ENCOUNTER — Encounter: Payer: Self-pay | Admitting: Podiatry

## 2020-11-22 DIAGNOSIS — M79675 Pain in left toe(s): Secondary | ICD-10-CM | POA: Diagnosis not present

## 2020-11-22 DIAGNOSIS — E1151 Type 2 diabetes mellitus with diabetic peripheral angiopathy without gangrene: Secondary | ICD-10-CM | POA: Diagnosis not present

## 2020-11-22 DIAGNOSIS — M79674 Pain in right toe(s): Secondary | ICD-10-CM

## 2020-11-22 DIAGNOSIS — B351 Tinea unguium: Secondary | ICD-10-CM | POA: Diagnosis not present

## 2020-11-23 DIAGNOSIS — D631 Anemia in chronic kidney disease: Secondary | ICD-10-CM | POA: Diagnosis not present

## 2020-11-23 DIAGNOSIS — E119 Type 2 diabetes mellitus without complications: Secondary | ICD-10-CM | POA: Diagnosis not present

## 2020-11-23 DIAGNOSIS — D509 Iron deficiency anemia, unspecified: Secondary | ICD-10-CM | POA: Diagnosis not present

## 2020-11-23 DIAGNOSIS — N2581 Secondary hyperparathyroidism of renal origin: Secondary | ICD-10-CM | POA: Diagnosis not present

## 2020-11-23 DIAGNOSIS — N186 End stage renal disease: Secondary | ICD-10-CM | POA: Diagnosis not present

## 2020-11-23 DIAGNOSIS — Z992 Dependence on renal dialysis: Secondary | ICD-10-CM | POA: Diagnosis not present

## 2020-11-25 DIAGNOSIS — N2581 Secondary hyperparathyroidism of renal origin: Secondary | ICD-10-CM | POA: Diagnosis not present

## 2020-11-25 DIAGNOSIS — D631 Anemia in chronic kidney disease: Secondary | ICD-10-CM | POA: Diagnosis not present

## 2020-11-25 DIAGNOSIS — D509 Iron deficiency anemia, unspecified: Secondary | ICD-10-CM | POA: Diagnosis not present

## 2020-11-25 DIAGNOSIS — E119 Type 2 diabetes mellitus without complications: Secondary | ICD-10-CM | POA: Diagnosis not present

## 2020-11-25 DIAGNOSIS — Z992 Dependence on renal dialysis: Secondary | ICD-10-CM | POA: Diagnosis not present

## 2020-11-25 DIAGNOSIS — N186 End stage renal disease: Secondary | ICD-10-CM | POA: Diagnosis not present

## 2020-11-27 NOTE — Progress Notes (Signed)
  Subjective:  Patient ID: Shawn [name], male    DOB: 1960-11-29,  MRN: [id number]  60 y.o. male presents at risk foot care with h/o NIDDM with ESRD on hemodialysis and painful thick toenails that are difficult to trim. Pain interferes with ambulation. Aggravating factors include wearing enclosed shoe gear. Pain is relieved with periodic professional debridement.   He presents with his wife on today's visit. They state he had no problems after last visit. Dialysis staff continues to perform monthly foot checks. His wife checks his feet as well.  He states he doesn't check his blood glucose daily. He needs more testing strips.  He relates no new pedal concerns on today's visit.  PCP is Dr. Vincente Liberty. He has upcoming appointment on January 03, 2021.  No Known Allergies  Review of Systems: Negative except as noted in the HPI.   Objective:   Constitutional Pt is a pleasant 60 y.o. African American male WD, WN in NAD. AAO x 3.   Vascular Capillary fill time to digits <3 seconds b/l lower extremities. Nonpalpable pedal pulse(s) b/l lower extremities. Pedal hair absent. Lower extremity skin temperature gradient warm to cool. No pain with calf compression b/l. No edema noted b/l lower extremities.  Neurologic Protective sensation diminished with 10g monofilament b/l.  Dermatologic Pedal skin with normal turgor, texture and tone bilaterally. No open wounds bilaterally. No interdigital macerations bilaterally. Toenails 1-5 b/l elongated, discolored, dystrophic, thickened, crumbly with subungual debris and tenderness to dorsal palpation. No hyperkeratotic nor porokeratotic lesions present on today's visit.  Orthopedic: Normal muscle strength 5/5 to all lower extremity muscle groups bilaterally. No pain crepitus or joint limitation noted with ROM b/l. Hallux valgus with bunion deformity noted b/l lower extremities. Hammertoe(s) noted to the L 2nd toe and R 2nd toe.   Radiographs:  None Assessment:   1. Pain due to onychomycosis of toenails of both feet   2. Type II diabetes mellitus with peripheral circulatory disorder [facility])    Plan:  Patient was evaluated and treated and all questions answered.  Onychomycosis with pain -Nails palliatively debridement as below. -Educated on self-care  Procedure: Nail Debridement Rationale: Pain Type of Debridement: manual, sharp debridement. Instrumentation: Nail nipper, rotary burr. Number of Nails: 10  -Examined patient. -Continue diabetic foot care principles. -Patient to continue soft, supportive shoe gear daily. -Toenails 1-5 b/l were debrided in length and girth with sterile nail nippers and dremel without iatrogenic bleeding.  -Patient to report any pedal injuries to medical professional immediately. -Patient/POA to call should there be question/concern in the interim.  Return in about 3 months (around 02/22/2021).  Marzetta Board, DPM

## 2020-11-28 DIAGNOSIS — N186 End stage renal disease: Secondary | ICD-10-CM | POA: Diagnosis not present

## 2020-11-28 DIAGNOSIS — D509 Iron deficiency anemia, unspecified: Secondary | ICD-10-CM | POA: Diagnosis not present

## 2020-11-28 DIAGNOSIS — N2581 Secondary hyperparathyroidism of renal origin: Secondary | ICD-10-CM | POA: Diagnosis not present

## 2020-11-28 DIAGNOSIS — Z992 Dependence on renal dialysis: Secondary | ICD-10-CM | POA: Diagnosis not present

## 2020-11-28 DIAGNOSIS — D631 Anemia in chronic kidney disease: Secondary | ICD-10-CM | POA: Diagnosis not present

## 2020-11-28 DIAGNOSIS — E119 Type 2 diabetes mellitus without complications: Secondary | ICD-10-CM | POA: Diagnosis not present

## 2020-11-30 DIAGNOSIS — N186 End stage renal disease: Secondary | ICD-10-CM | POA: Diagnosis not present

## 2020-11-30 DIAGNOSIS — D509 Iron deficiency anemia, unspecified: Secondary | ICD-10-CM | POA: Diagnosis not present

## 2020-11-30 DIAGNOSIS — Z992 Dependence on renal dialysis: Secondary | ICD-10-CM | POA: Diagnosis not present

## 2020-11-30 DIAGNOSIS — E119 Type 2 diabetes mellitus without complications: Secondary | ICD-10-CM | POA: Diagnosis not present

## 2020-11-30 DIAGNOSIS — N2581 Secondary hyperparathyroidism of renal origin: Secondary | ICD-10-CM | POA: Diagnosis not present

## 2020-11-30 DIAGNOSIS — D631 Anemia in chronic kidney disease: Secondary | ICD-10-CM | POA: Diagnosis not present

## 2020-12-01 DIAGNOSIS — H401133 Primary open-angle glaucoma, bilateral, severe stage: Secondary | ICD-10-CM | POA: Diagnosis not present

## 2020-12-01 DIAGNOSIS — H16122 Filamentary keratitis, left eye: Secondary | ICD-10-CM | POA: Diagnosis not present

## 2020-12-02 DIAGNOSIS — E119 Type 2 diabetes mellitus without complications: Secondary | ICD-10-CM | POA: Diagnosis not present

## 2020-12-02 DIAGNOSIS — D631 Anemia in chronic kidney disease: Secondary | ICD-10-CM | POA: Diagnosis not present

## 2020-12-02 DIAGNOSIS — N186 End stage renal disease: Secondary | ICD-10-CM | POA: Diagnosis not present

## 2020-12-02 DIAGNOSIS — Z992 Dependence on renal dialysis: Secondary | ICD-10-CM | POA: Diagnosis not present

## 2020-12-02 DIAGNOSIS — N2581 Secondary hyperparathyroidism of renal origin: Secondary | ICD-10-CM | POA: Diagnosis not present

## 2020-12-02 DIAGNOSIS — D509 Iron deficiency anemia, unspecified: Secondary | ICD-10-CM | POA: Diagnosis not present

## 2020-12-05 DIAGNOSIS — N186 End stage renal disease: Secondary | ICD-10-CM | POA: Diagnosis not present

## 2020-12-05 DIAGNOSIS — D509 Iron deficiency anemia, unspecified: Secondary | ICD-10-CM | POA: Diagnosis not present

## 2020-12-05 DIAGNOSIS — E119 Type 2 diabetes mellitus without complications: Secondary | ICD-10-CM | POA: Diagnosis not present

## 2020-12-05 DIAGNOSIS — Z992 Dependence on renal dialysis: Secondary | ICD-10-CM | POA: Diagnosis not present

## 2020-12-05 DIAGNOSIS — D631 Anemia in chronic kidney disease: Secondary | ICD-10-CM | POA: Diagnosis not present

## 2020-12-05 DIAGNOSIS — N2581 Secondary hyperparathyroidism of renal origin: Secondary | ICD-10-CM | POA: Diagnosis not present

## 2020-12-07 DIAGNOSIS — E119 Type 2 diabetes mellitus without complications: Secondary | ICD-10-CM | POA: Diagnosis not present

## 2020-12-07 DIAGNOSIS — I120 Hypertensive chronic kidney disease with stage 5 chronic kidney disease or end stage renal disease: Secondary | ICD-10-CM | POA: Diagnosis not present

## 2020-12-07 DIAGNOSIS — N186 End stage renal disease: Secondary | ICD-10-CM | POA: Diagnosis not present

## 2020-12-07 DIAGNOSIS — N2581 Secondary hyperparathyroidism of renal origin: Secondary | ICD-10-CM | POA: Diagnosis not present

## 2020-12-07 DIAGNOSIS — D631 Anemia in chronic kidney disease: Secondary | ICD-10-CM | POA: Diagnosis not present

## 2020-12-07 DIAGNOSIS — D509 Iron deficiency anemia, unspecified: Secondary | ICD-10-CM | POA: Diagnosis not present

## 2020-12-07 DIAGNOSIS — Z992 Dependence on renal dialysis: Secondary | ICD-10-CM | POA: Diagnosis not present

## 2020-12-08 DIAGNOSIS — Z992 Dependence on renal dialysis: Secondary | ICD-10-CM | POA: Diagnosis not present

## 2020-12-08 DIAGNOSIS — N2581 Secondary hyperparathyroidism of renal origin: Secondary | ICD-10-CM | POA: Diagnosis not present

## 2020-12-08 DIAGNOSIS — E119 Type 2 diabetes mellitus without complications: Secondary | ICD-10-CM | POA: Diagnosis not present

## 2020-12-08 DIAGNOSIS — D509 Iron deficiency anemia, unspecified: Secondary | ICD-10-CM | POA: Diagnosis not present

## 2020-12-08 DIAGNOSIS — D631 Anemia in chronic kidney disease: Secondary | ICD-10-CM | POA: Diagnosis not present

## 2020-12-08 DIAGNOSIS — N186 End stage renal disease: Secondary | ICD-10-CM | POA: Diagnosis not present

## 2020-12-12 DIAGNOSIS — D509 Iron deficiency anemia, unspecified: Secondary | ICD-10-CM | POA: Diagnosis not present

## 2020-12-12 DIAGNOSIS — D631 Anemia in chronic kidney disease: Secondary | ICD-10-CM | POA: Diagnosis not present

## 2020-12-12 DIAGNOSIS — Z992 Dependence on renal dialysis: Secondary | ICD-10-CM | POA: Diagnosis not present

## 2020-12-12 DIAGNOSIS — N2581 Secondary hyperparathyroidism of renal origin: Secondary | ICD-10-CM | POA: Diagnosis not present

## 2020-12-12 DIAGNOSIS — E119 Type 2 diabetes mellitus without complications: Secondary | ICD-10-CM | POA: Diagnosis not present

## 2020-12-12 DIAGNOSIS — N186 End stage renal disease: Secondary | ICD-10-CM | POA: Diagnosis not present

## 2020-12-14 DIAGNOSIS — D509 Iron deficiency anemia, unspecified: Secondary | ICD-10-CM | POA: Diagnosis not present

## 2020-12-14 DIAGNOSIS — N2581 Secondary hyperparathyroidism of renal origin: Secondary | ICD-10-CM | POA: Diagnosis not present

## 2020-12-14 DIAGNOSIS — E119 Type 2 diabetes mellitus without complications: Secondary | ICD-10-CM | POA: Diagnosis not present

## 2020-12-14 DIAGNOSIS — Z992 Dependence on renal dialysis: Secondary | ICD-10-CM | POA: Diagnosis not present

## 2020-12-14 DIAGNOSIS — N186 End stage renal disease: Secondary | ICD-10-CM | POA: Diagnosis not present

## 2020-12-14 DIAGNOSIS — D631 Anemia in chronic kidney disease: Secondary | ICD-10-CM | POA: Diagnosis not present

## 2020-12-16 DIAGNOSIS — N186 End stage renal disease: Secondary | ICD-10-CM | POA: Diagnosis not present

## 2020-12-16 DIAGNOSIS — D509 Iron deficiency anemia, unspecified: Secondary | ICD-10-CM | POA: Diagnosis not present

## 2020-12-16 DIAGNOSIS — Z992 Dependence on renal dialysis: Secondary | ICD-10-CM | POA: Diagnosis not present

## 2020-12-16 DIAGNOSIS — E119 Type 2 diabetes mellitus without complications: Secondary | ICD-10-CM | POA: Diagnosis not present

## 2020-12-16 DIAGNOSIS — N2581 Secondary hyperparathyroidism of renal origin: Secondary | ICD-10-CM | POA: Diagnosis not present

## 2020-12-16 DIAGNOSIS — D631 Anemia in chronic kidney disease: Secondary | ICD-10-CM | POA: Diagnosis not present

## 2020-12-19 DIAGNOSIS — N2581 Secondary hyperparathyroidism of renal origin: Secondary | ICD-10-CM | POA: Diagnosis not present

## 2020-12-19 DIAGNOSIS — E119 Type 2 diabetes mellitus without complications: Secondary | ICD-10-CM | POA: Diagnosis not present

## 2020-12-19 DIAGNOSIS — D631 Anemia in chronic kidney disease: Secondary | ICD-10-CM | POA: Diagnosis not present

## 2020-12-19 DIAGNOSIS — N186 End stage renal disease: Secondary | ICD-10-CM | POA: Diagnosis not present

## 2020-12-19 DIAGNOSIS — D509 Iron deficiency anemia, unspecified: Secondary | ICD-10-CM | POA: Diagnosis not present

## 2020-12-19 DIAGNOSIS — Z992 Dependence on renal dialysis: Secondary | ICD-10-CM | POA: Diagnosis not present

## 2020-12-21 DIAGNOSIS — Z992 Dependence on renal dialysis: Secondary | ICD-10-CM | POA: Diagnosis not present

## 2020-12-21 DIAGNOSIS — D509 Iron deficiency anemia, unspecified: Secondary | ICD-10-CM | POA: Diagnosis not present

## 2020-12-21 DIAGNOSIS — N2581 Secondary hyperparathyroidism of renal origin: Secondary | ICD-10-CM | POA: Diagnosis not present

## 2020-12-21 DIAGNOSIS — E119 Type 2 diabetes mellitus without complications: Secondary | ICD-10-CM | POA: Diagnosis not present

## 2020-12-21 DIAGNOSIS — D631 Anemia in chronic kidney disease: Secondary | ICD-10-CM | POA: Diagnosis not present

## 2020-12-21 DIAGNOSIS — N186 End stage renal disease: Secondary | ICD-10-CM | POA: Diagnosis not present

## 2020-12-23 DIAGNOSIS — N186 End stage renal disease: Secondary | ICD-10-CM | POA: Diagnosis not present

## 2020-12-23 DIAGNOSIS — E119 Type 2 diabetes mellitus without complications: Secondary | ICD-10-CM | POA: Diagnosis not present

## 2020-12-23 DIAGNOSIS — N2581 Secondary hyperparathyroidism of renal origin: Secondary | ICD-10-CM | POA: Diagnosis not present

## 2020-12-23 DIAGNOSIS — D631 Anemia in chronic kidney disease: Secondary | ICD-10-CM | POA: Diagnosis not present

## 2020-12-23 DIAGNOSIS — D509 Iron deficiency anemia, unspecified: Secondary | ICD-10-CM | POA: Diagnosis not present

## 2020-12-23 DIAGNOSIS — Z992 Dependence on renal dialysis: Secondary | ICD-10-CM | POA: Diagnosis not present

## 2020-12-26 DIAGNOSIS — N2581 Secondary hyperparathyroidism of renal origin: Secondary | ICD-10-CM | POA: Diagnosis not present

## 2020-12-26 DIAGNOSIS — D631 Anemia in chronic kidney disease: Secondary | ICD-10-CM | POA: Diagnosis not present

## 2020-12-26 DIAGNOSIS — E119 Type 2 diabetes mellitus without complications: Secondary | ICD-10-CM | POA: Diagnosis not present

## 2020-12-26 DIAGNOSIS — D509 Iron deficiency anemia, unspecified: Secondary | ICD-10-CM | POA: Diagnosis not present

## 2020-12-26 DIAGNOSIS — Z992 Dependence on renal dialysis: Secondary | ICD-10-CM | POA: Diagnosis not present

## 2020-12-26 DIAGNOSIS — N186 End stage renal disease: Secondary | ICD-10-CM | POA: Diagnosis not present

## 2020-12-28 DIAGNOSIS — D509 Iron deficiency anemia, unspecified: Secondary | ICD-10-CM | POA: Diagnosis not present

## 2020-12-28 DIAGNOSIS — D631 Anemia in chronic kidney disease: Secondary | ICD-10-CM | POA: Diagnosis not present

## 2020-12-28 DIAGNOSIS — N2581 Secondary hyperparathyroidism of renal origin: Secondary | ICD-10-CM | POA: Diagnosis not present

## 2020-12-28 DIAGNOSIS — N186 End stage renal disease: Secondary | ICD-10-CM | POA: Diagnosis not present

## 2020-12-28 DIAGNOSIS — Z992 Dependence on renal dialysis: Secondary | ICD-10-CM | POA: Diagnosis not present

## 2020-12-28 DIAGNOSIS — E119 Type 2 diabetes mellitus without complications: Secondary | ICD-10-CM | POA: Diagnosis not present

## 2020-12-30 DIAGNOSIS — N186 End stage renal disease: Secondary | ICD-10-CM | POA: Diagnosis not present

## 2020-12-30 DIAGNOSIS — N2581 Secondary hyperparathyroidism of renal origin: Secondary | ICD-10-CM | POA: Diagnosis not present

## 2020-12-30 DIAGNOSIS — E119 Type 2 diabetes mellitus without complications: Secondary | ICD-10-CM | POA: Diagnosis not present

## 2020-12-30 DIAGNOSIS — Z992 Dependence on renal dialysis: Secondary | ICD-10-CM | POA: Diagnosis not present

## 2020-12-30 DIAGNOSIS — D509 Iron deficiency anemia, unspecified: Secondary | ICD-10-CM | POA: Diagnosis not present

## 2020-12-30 DIAGNOSIS — D631 Anemia in chronic kidney disease: Secondary | ICD-10-CM | POA: Diagnosis not present

## 2021-01-02 DIAGNOSIS — Z992 Dependence on renal dialysis: Secondary | ICD-10-CM | POA: Diagnosis not present

## 2021-01-02 DIAGNOSIS — D631 Anemia in chronic kidney disease: Secondary | ICD-10-CM | POA: Diagnosis not present

## 2021-01-02 DIAGNOSIS — N2581 Secondary hyperparathyroidism of renal origin: Secondary | ICD-10-CM | POA: Diagnosis not present

## 2021-01-02 DIAGNOSIS — D509 Iron deficiency anemia, unspecified: Secondary | ICD-10-CM | POA: Diagnosis not present

## 2021-01-02 DIAGNOSIS — N186 End stage renal disease: Secondary | ICD-10-CM | POA: Diagnosis not present

## 2021-01-02 DIAGNOSIS — E119 Type 2 diabetes mellitus without complications: Secondary | ICD-10-CM | POA: Diagnosis not present

## 2021-01-04 DIAGNOSIS — Z992 Dependence on renal dialysis: Secondary | ICD-10-CM | POA: Diagnosis not present

## 2021-01-04 DIAGNOSIS — N186 End stage renal disease: Secondary | ICD-10-CM | POA: Diagnosis not present

## 2021-01-04 DIAGNOSIS — N2581 Secondary hyperparathyroidism of renal origin: Secondary | ICD-10-CM | POA: Diagnosis not present

## 2021-01-04 DIAGNOSIS — D509 Iron deficiency anemia, unspecified: Secondary | ICD-10-CM | POA: Diagnosis not present

## 2021-01-04 DIAGNOSIS — E119 Type 2 diabetes mellitus without complications: Secondary | ICD-10-CM | POA: Diagnosis not present

## 2021-01-04 DIAGNOSIS — D631 Anemia in chronic kidney disease: Secondary | ICD-10-CM | POA: Diagnosis not present

## 2021-01-06 DIAGNOSIS — I120 Hypertensive chronic kidney disease with stage 5 chronic kidney disease or end stage renal disease: Secondary | ICD-10-CM | POA: Diagnosis not present

## 2021-01-06 DIAGNOSIS — D509 Iron deficiency anemia, unspecified: Secondary | ICD-10-CM | POA: Diagnosis not present

## 2021-01-06 DIAGNOSIS — Z992 Dependence on renal dialysis: Secondary | ICD-10-CM | POA: Diagnosis not present

## 2021-01-06 DIAGNOSIS — E119 Type 2 diabetes mellitus without complications: Secondary | ICD-10-CM | POA: Diagnosis not present

## 2021-01-06 DIAGNOSIS — N2581 Secondary hyperparathyroidism of renal origin: Secondary | ICD-10-CM | POA: Diagnosis not present

## 2021-01-06 DIAGNOSIS — N186 End stage renal disease: Secondary | ICD-10-CM | POA: Diagnosis not present

## 2021-01-06 DIAGNOSIS — D631 Anemia in chronic kidney disease: Secondary | ICD-10-CM | POA: Diagnosis not present

## 2021-01-09 DIAGNOSIS — D631 Anemia in chronic kidney disease: Secondary | ICD-10-CM | POA: Diagnosis not present

## 2021-01-09 DIAGNOSIS — E119 Type 2 diabetes mellitus without complications: Secondary | ICD-10-CM | POA: Diagnosis not present

## 2021-01-09 DIAGNOSIS — N186 End stage renal disease: Secondary | ICD-10-CM | POA: Diagnosis not present

## 2021-01-09 DIAGNOSIS — D509 Iron deficiency anemia, unspecified: Secondary | ICD-10-CM | POA: Diagnosis not present

## 2021-01-09 DIAGNOSIS — Z992 Dependence on renal dialysis: Secondary | ICD-10-CM | POA: Diagnosis not present

## 2021-01-09 DIAGNOSIS — N2581 Secondary hyperparathyroidism of renal origin: Secondary | ICD-10-CM | POA: Diagnosis not present

## 2021-01-11 DIAGNOSIS — N186 End stage renal disease: Secondary | ICD-10-CM | POA: Diagnosis not present

## 2021-01-11 DIAGNOSIS — D631 Anemia in chronic kidney disease: Secondary | ICD-10-CM | POA: Diagnosis not present

## 2021-01-11 DIAGNOSIS — N2581 Secondary hyperparathyroidism of renal origin: Secondary | ICD-10-CM | POA: Diagnosis not present

## 2021-01-11 DIAGNOSIS — D509 Iron deficiency anemia, unspecified: Secondary | ICD-10-CM | POA: Diagnosis not present

## 2021-01-11 DIAGNOSIS — E119 Type 2 diabetes mellitus without complications: Secondary | ICD-10-CM | POA: Diagnosis not present

## 2021-01-11 DIAGNOSIS — Z992 Dependence on renal dialysis: Secondary | ICD-10-CM | POA: Diagnosis not present

## 2021-01-13 DIAGNOSIS — N2581 Secondary hyperparathyroidism of renal origin: Secondary | ICD-10-CM | POA: Diagnosis not present

## 2021-01-13 DIAGNOSIS — E119 Type 2 diabetes mellitus without complications: Secondary | ICD-10-CM | POA: Diagnosis not present

## 2021-01-13 DIAGNOSIS — Z992 Dependence on renal dialysis: Secondary | ICD-10-CM | POA: Diagnosis not present

## 2021-01-13 DIAGNOSIS — D509 Iron deficiency anemia, unspecified: Secondary | ICD-10-CM | POA: Diagnosis not present

## 2021-01-13 DIAGNOSIS — D631 Anemia in chronic kidney disease: Secondary | ICD-10-CM | POA: Diagnosis not present

## 2021-01-13 DIAGNOSIS — N186 End stage renal disease: Secondary | ICD-10-CM | POA: Diagnosis not present

## 2021-01-16 DIAGNOSIS — Z992 Dependence on renal dialysis: Secondary | ICD-10-CM | POA: Diagnosis not present

## 2021-01-16 DIAGNOSIS — D631 Anemia in chronic kidney disease: Secondary | ICD-10-CM | POA: Diagnosis not present

## 2021-01-16 DIAGNOSIS — D509 Iron deficiency anemia, unspecified: Secondary | ICD-10-CM | POA: Diagnosis not present

## 2021-01-16 DIAGNOSIS — N2581 Secondary hyperparathyroidism of renal origin: Secondary | ICD-10-CM | POA: Diagnosis not present

## 2021-01-16 DIAGNOSIS — N186 End stage renal disease: Secondary | ICD-10-CM | POA: Diagnosis not present

## 2021-01-16 DIAGNOSIS — E119 Type 2 diabetes mellitus without complications: Secondary | ICD-10-CM | POA: Diagnosis not present

## 2021-01-17 DIAGNOSIS — E7800 Pure hypercholesterolemia, unspecified: Secondary | ICD-10-CM | POA: Diagnosis not present

## 2021-01-17 DIAGNOSIS — R55 Syncope and collapse: Secondary | ICD-10-CM | POA: Diagnosis not present

## 2021-01-17 DIAGNOSIS — Z79899 Other long term (current) drug therapy: Secondary | ICD-10-CM | POA: Diagnosis not present

## 2021-01-17 DIAGNOSIS — E1165 Type 2 diabetes mellitus with hyperglycemia: Secondary | ICD-10-CM | POA: Diagnosis not present

## 2021-01-17 DIAGNOSIS — I6990 Unspecified sequelae of unspecified cerebrovascular disease: Secondary | ICD-10-CM | POA: Diagnosis not present

## 2021-01-17 DIAGNOSIS — M2011 Hallux valgus (acquired), right foot: Secondary | ICD-10-CM | POA: Diagnosis not present

## 2021-01-17 DIAGNOSIS — M7552 Bursitis of left shoulder: Secondary | ICD-10-CM | POA: Diagnosis not present

## 2021-01-17 DIAGNOSIS — H547 Unspecified visual loss: Secondary | ICD-10-CM | POA: Diagnosis not present

## 2021-01-17 DIAGNOSIS — I999 Unspecified disorder of circulatory system: Secondary | ICD-10-CM | POA: Diagnosis not present

## 2021-01-17 DIAGNOSIS — I119 Hypertensive heart disease without heart failure: Secondary | ICD-10-CM | POA: Diagnosis not present

## 2021-01-18 DIAGNOSIS — D509 Iron deficiency anemia, unspecified: Secondary | ICD-10-CM | POA: Diagnosis not present

## 2021-01-18 DIAGNOSIS — E119 Type 2 diabetes mellitus without complications: Secondary | ICD-10-CM | POA: Diagnosis not present

## 2021-01-18 DIAGNOSIS — N2581 Secondary hyperparathyroidism of renal origin: Secondary | ICD-10-CM | POA: Diagnosis not present

## 2021-01-18 DIAGNOSIS — N186 End stage renal disease: Secondary | ICD-10-CM | POA: Diagnosis not present

## 2021-01-18 DIAGNOSIS — D631 Anemia in chronic kidney disease: Secondary | ICD-10-CM | POA: Diagnosis not present

## 2021-01-18 DIAGNOSIS — Z992 Dependence on renal dialysis: Secondary | ICD-10-CM | POA: Diagnosis not present

## 2021-01-20 DIAGNOSIS — Z992 Dependence on renal dialysis: Secondary | ICD-10-CM | POA: Diagnosis not present

## 2021-01-20 DIAGNOSIS — E119 Type 2 diabetes mellitus without complications: Secondary | ICD-10-CM | POA: Diagnosis not present

## 2021-01-20 DIAGNOSIS — N186 End stage renal disease: Secondary | ICD-10-CM | POA: Diagnosis not present

## 2021-01-20 DIAGNOSIS — D631 Anemia in chronic kidney disease: Secondary | ICD-10-CM | POA: Diagnosis not present

## 2021-01-20 DIAGNOSIS — N2581 Secondary hyperparathyroidism of renal origin: Secondary | ICD-10-CM | POA: Diagnosis not present

## 2021-01-20 DIAGNOSIS — D509 Iron deficiency anemia, unspecified: Secondary | ICD-10-CM | POA: Diagnosis not present

## 2021-01-23 DIAGNOSIS — N2581 Secondary hyperparathyroidism of renal origin: Secondary | ICD-10-CM | POA: Diagnosis not present

## 2021-01-23 DIAGNOSIS — N186 End stage renal disease: Secondary | ICD-10-CM | POA: Diagnosis not present

## 2021-01-23 DIAGNOSIS — E119 Type 2 diabetes mellitus without complications: Secondary | ICD-10-CM | POA: Diagnosis not present

## 2021-01-23 DIAGNOSIS — D509 Iron deficiency anemia, unspecified: Secondary | ICD-10-CM | POA: Diagnosis not present

## 2021-01-23 DIAGNOSIS — Z992 Dependence on renal dialysis: Secondary | ICD-10-CM | POA: Diagnosis not present

## 2021-01-23 DIAGNOSIS — D631 Anemia in chronic kidney disease: Secondary | ICD-10-CM | POA: Diagnosis not present

## 2021-01-25 DIAGNOSIS — N186 End stage renal disease: Secondary | ICD-10-CM | POA: Diagnosis not present

## 2021-01-25 DIAGNOSIS — D631 Anemia in chronic kidney disease: Secondary | ICD-10-CM | POA: Diagnosis not present

## 2021-01-25 DIAGNOSIS — Z992 Dependence on renal dialysis: Secondary | ICD-10-CM | POA: Diagnosis not present

## 2021-01-25 DIAGNOSIS — N2581 Secondary hyperparathyroidism of renal origin: Secondary | ICD-10-CM | POA: Diagnosis not present

## 2021-01-25 DIAGNOSIS — E1129 Type 2 diabetes mellitus with other diabetic kidney complication: Secondary | ICD-10-CM | POA: Diagnosis not present

## 2021-01-25 DIAGNOSIS — D509 Iron deficiency anemia, unspecified: Secondary | ICD-10-CM | POA: Diagnosis not present

## 2021-01-25 DIAGNOSIS — E119 Type 2 diabetes mellitus without complications: Secondary | ICD-10-CM | POA: Diagnosis not present

## 2021-01-27 DIAGNOSIS — D509 Iron deficiency anemia, unspecified: Secondary | ICD-10-CM | POA: Diagnosis not present

## 2021-01-27 DIAGNOSIS — D631 Anemia in chronic kidney disease: Secondary | ICD-10-CM | POA: Diagnosis not present

## 2021-01-27 DIAGNOSIS — N186 End stage renal disease: Secondary | ICD-10-CM | POA: Diagnosis not present

## 2021-01-27 DIAGNOSIS — Z992 Dependence on renal dialysis: Secondary | ICD-10-CM | POA: Diagnosis not present

## 2021-01-27 DIAGNOSIS — N2581 Secondary hyperparathyroidism of renal origin: Secondary | ICD-10-CM | POA: Diagnosis not present

## 2021-01-27 DIAGNOSIS — E119 Type 2 diabetes mellitus without complications: Secondary | ICD-10-CM | POA: Diagnosis not present

## 2021-01-30 DIAGNOSIS — N2581 Secondary hyperparathyroidism of renal origin: Secondary | ICD-10-CM | POA: Diagnosis not present

## 2021-01-30 DIAGNOSIS — D631 Anemia in chronic kidney disease: Secondary | ICD-10-CM | POA: Diagnosis not present

## 2021-01-30 DIAGNOSIS — N186 End stage renal disease: Secondary | ICD-10-CM | POA: Diagnosis not present

## 2021-01-30 DIAGNOSIS — D509 Iron deficiency anemia, unspecified: Secondary | ICD-10-CM | POA: Diagnosis not present

## 2021-01-30 DIAGNOSIS — E119 Type 2 diabetes mellitus without complications: Secondary | ICD-10-CM | POA: Diagnosis not present

## 2021-01-30 DIAGNOSIS — Z992 Dependence on renal dialysis: Secondary | ICD-10-CM | POA: Diagnosis not present

## 2021-01-31 ENCOUNTER — Other Ambulatory Visit: Payer: Self-pay

## 2021-01-31 ENCOUNTER — Encounter: Payer: Self-pay | Admitting: Podiatry

## 2021-01-31 ENCOUNTER — Ambulatory Visit (INDEPENDENT_AMBULATORY_CARE_PROVIDER_SITE_OTHER): Payer: Medicare Other | Admitting: Podiatry

## 2021-01-31 DIAGNOSIS — E1151 Type 2 diabetes mellitus with diabetic peripheral angiopathy without gangrene: Secondary | ICD-10-CM | POA: Diagnosis not present

## 2021-01-31 DIAGNOSIS — L600 Ingrowing nail: Secondary | ICD-10-CM

## 2021-01-31 DIAGNOSIS — Z992 Dependence on renal dialysis: Secondary | ICD-10-CM | POA: Diagnosis not present

## 2021-01-31 DIAGNOSIS — M79674 Pain in right toe(s): Secondary | ICD-10-CM | POA: Diagnosis not present

## 2021-01-31 DIAGNOSIS — N186 End stage renal disease: Secondary | ICD-10-CM | POA: Diagnosis not present

## 2021-01-31 DIAGNOSIS — B351 Tinea unguium: Secondary | ICD-10-CM

## 2021-01-31 DIAGNOSIS — M79675 Pain in left toe(s): Secondary | ICD-10-CM

## 2021-02-01 DIAGNOSIS — D509 Iron deficiency anemia, unspecified: Secondary | ICD-10-CM | POA: Diagnosis not present

## 2021-02-01 DIAGNOSIS — Z992 Dependence on renal dialysis: Secondary | ICD-10-CM | POA: Diagnosis not present

## 2021-02-01 DIAGNOSIS — E119 Type 2 diabetes mellitus without complications: Secondary | ICD-10-CM | POA: Diagnosis not present

## 2021-02-01 DIAGNOSIS — N2581 Secondary hyperparathyroidism of renal origin: Secondary | ICD-10-CM | POA: Diagnosis not present

## 2021-02-01 DIAGNOSIS — N186 End stage renal disease: Secondary | ICD-10-CM | POA: Diagnosis not present

## 2021-02-01 DIAGNOSIS — D631 Anemia in chronic kidney disease: Secondary | ICD-10-CM | POA: Diagnosis not present

## 2021-02-03 DIAGNOSIS — E119 Type 2 diabetes mellitus without complications: Secondary | ICD-10-CM | POA: Diagnosis not present

## 2021-02-03 DIAGNOSIS — D509 Iron deficiency anemia, unspecified: Secondary | ICD-10-CM | POA: Diagnosis not present

## 2021-02-03 DIAGNOSIS — N2581 Secondary hyperparathyroidism of renal origin: Secondary | ICD-10-CM | POA: Diagnosis not present

## 2021-02-03 DIAGNOSIS — Z992 Dependence on renal dialysis: Secondary | ICD-10-CM | POA: Diagnosis not present

## 2021-02-03 DIAGNOSIS — N186 End stage renal disease: Secondary | ICD-10-CM | POA: Diagnosis not present

## 2021-02-03 DIAGNOSIS — D631 Anemia in chronic kidney disease: Secondary | ICD-10-CM | POA: Diagnosis not present

## 2021-02-03 NOTE — Progress Notes (Signed)
Subjective: Shawn [name] is a pleasant 60 y.o. male patient seen today for at risk foot care. He has h/o NIDDM with PAD, also on renal dialysis. He is accompanied by his wife on today's visit. He is seen for tender mycotic toenails that are difficult to trim. Pain interferes with ambulation. Aggravating factors include wearing enclosed shoe gear. Pain is relieved with periodic professional debridement.  He states his dialysis center has not been performing foot checks lately. His wife monitors his feet daily.  He did not check his blood sugar today.   PCP is Vincente Liberty, MD. Last visit was: July, 2022.  No Known Allergies  Objective: Physical Exam  General: [name] is a pleasant 60 y.o. African American male, thin build in NAD. AAO x 3.   Vascular:  Capillary fill time to digits <3 seconds b/l lower extremities. Nonpalpable DP pulse(s) b/l lower extremities. Nonpalpable PT pulse(s) b/l lower extremities. Pedal hair absent. Lower extremity skin temperature gradient within normal limits. No pain with calf compression b/l. No edema noted b/l lower extremities.  Dermatological:  Pedal skin is thin shiny, atrophic b/l lower extremities. No open wounds b/l lower extremities. No interdigital macerations b/l lower extremities. Toenails 1-5 b/l elongated, discolored, dystrophic, thickened, crumbly with subungual debris and tenderness to dorsal palpation. Incurvated nailplate bilateral border(s) L hallux and R hallux.  Nail border hypertrophy minimal. There is tenderness to palpation. Sign(s) of infection: no clinical signs of infection noted on examination today.. No hyperkeratotic nor porokeratotic lesions present on today's visit.  Musculoskeletal:  Normal muscle strength 5/5 to all lower extremity muscle groups bilaterally. No pain crepitus or joint limitation noted with ROM b/l. Hammertoe(s) noted to the L 2nd toe and R 2nd toe.  Neurological:  Protective sensation  diminished with 10g monofilament b/l.  Assessment and Plan:  1. Pain due to onychomycosis of toenails of both feet   2. Ingrown toenail without infection   3. ESRD on dialysis ([place])   4. Type II diabetes mellitus with peripheral circulatory disorder ([facility])      -Examined patient. -Continue diabetic foot care principles. -Patient to continue soft, supportive shoe gear daily. -Toenails 1-5 b/l were debrided in length and girth with sterile nail nippers and dremel without iatrogenic bleeding.  -Offending nail border debrided and curretaged L hallux and R hallux utilizing sterile nail nipper and currette. Border(s) cleansed with alcohol and triple antibiotic ointment applied. Patient instructed to apply triple antibiotic ointment to L hallux and R hallux once daily for 7 days. -Patient to report any pedal injuries to medical professional immediately. -Patient/POA to call should there be question/concern in the interim.  Return in about 3 months (around 05/03/2021).  Marzetta Board, DPM

## 2021-02-06 DIAGNOSIS — N186 End stage renal disease: Secondary | ICD-10-CM | POA: Diagnosis not present

## 2021-02-06 DIAGNOSIS — D509 Iron deficiency anemia, unspecified: Secondary | ICD-10-CM | POA: Diagnosis not present

## 2021-02-06 DIAGNOSIS — I120 Hypertensive chronic kidney disease with stage 5 chronic kidney disease or end stage renal disease: Secondary | ICD-10-CM | POA: Diagnosis not present

## 2021-02-06 DIAGNOSIS — D631 Anemia in chronic kidney disease: Secondary | ICD-10-CM | POA: Diagnosis not present

## 2021-02-06 DIAGNOSIS — Z992 Dependence on renal dialysis: Secondary | ICD-10-CM | POA: Diagnosis not present

## 2021-02-06 DIAGNOSIS — N2581 Secondary hyperparathyroidism of renal origin: Secondary | ICD-10-CM | POA: Diagnosis not present

## 2021-02-06 DIAGNOSIS — E119 Type 2 diabetes mellitus without complications: Secondary | ICD-10-CM | POA: Diagnosis not present

## 2021-02-07 DIAGNOSIS — D509 Iron deficiency anemia, unspecified: Secondary | ICD-10-CM | POA: Diagnosis not present

## 2021-02-07 DIAGNOSIS — E119 Type 2 diabetes mellitus without complications: Secondary | ICD-10-CM | POA: Diagnosis not present

## 2021-02-07 DIAGNOSIS — Z992 Dependence on renal dialysis: Secondary | ICD-10-CM | POA: Diagnosis not present

## 2021-02-07 DIAGNOSIS — D631 Anemia in chronic kidney disease: Secondary | ICD-10-CM | POA: Diagnosis not present

## 2021-02-07 DIAGNOSIS — N2581 Secondary hyperparathyroidism of renal origin: Secondary | ICD-10-CM | POA: Diagnosis not present

## 2021-02-07 DIAGNOSIS — N186 End stage renal disease: Secondary | ICD-10-CM | POA: Diagnosis not present

## 2021-02-10 DIAGNOSIS — D509 Iron deficiency anemia, unspecified: Secondary | ICD-10-CM | POA: Diagnosis not present

## 2021-02-10 DIAGNOSIS — D631 Anemia in chronic kidney disease: Secondary | ICD-10-CM | POA: Diagnosis not present

## 2021-02-10 DIAGNOSIS — N2581 Secondary hyperparathyroidism of renal origin: Secondary | ICD-10-CM | POA: Diagnosis not present

## 2021-02-10 DIAGNOSIS — N186 End stage renal disease: Secondary | ICD-10-CM | POA: Diagnosis not present

## 2021-02-10 DIAGNOSIS — E119 Type 2 diabetes mellitus without complications: Secondary | ICD-10-CM | POA: Diagnosis not present

## 2021-02-10 DIAGNOSIS — Z992 Dependence on renal dialysis: Secondary | ICD-10-CM | POA: Diagnosis not present

## 2021-02-13 DIAGNOSIS — N186 End stage renal disease: Secondary | ICD-10-CM | POA: Diagnosis not present

## 2021-02-13 DIAGNOSIS — E119 Type 2 diabetes mellitus without complications: Secondary | ICD-10-CM | POA: Diagnosis not present

## 2021-02-13 DIAGNOSIS — N2581 Secondary hyperparathyroidism of renal origin: Secondary | ICD-10-CM | POA: Diagnosis not present

## 2021-02-13 DIAGNOSIS — D631 Anemia in chronic kidney disease: Secondary | ICD-10-CM | POA: Diagnosis not present

## 2021-02-13 DIAGNOSIS — Z992 Dependence on renal dialysis: Secondary | ICD-10-CM | POA: Diagnosis not present

## 2021-02-13 DIAGNOSIS — D509 Iron deficiency anemia, unspecified: Secondary | ICD-10-CM | POA: Diagnosis not present

## 2021-02-15 DIAGNOSIS — N2581 Secondary hyperparathyroidism of renal origin: Secondary | ICD-10-CM | POA: Diagnosis not present

## 2021-02-15 DIAGNOSIS — D631 Anemia in chronic kidney disease: Secondary | ICD-10-CM | POA: Diagnosis not present

## 2021-02-15 DIAGNOSIS — E119 Type 2 diabetes mellitus without complications: Secondary | ICD-10-CM | POA: Diagnosis not present

## 2021-02-15 DIAGNOSIS — Z992 Dependence on renal dialysis: Secondary | ICD-10-CM | POA: Diagnosis not present

## 2021-02-15 DIAGNOSIS — N186 End stage renal disease: Secondary | ICD-10-CM | POA: Diagnosis not present

## 2021-02-15 DIAGNOSIS — D509 Iron deficiency anemia, unspecified: Secondary | ICD-10-CM | POA: Diagnosis not present

## 2021-02-17 DIAGNOSIS — E119 Type 2 diabetes mellitus without complications: Secondary | ICD-10-CM | POA: Diagnosis not present

## 2021-02-17 DIAGNOSIS — N2581 Secondary hyperparathyroidism of renal origin: Secondary | ICD-10-CM | POA: Diagnosis not present

## 2021-02-17 DIAGNOSIS — N186 End stage renal disease: Secondary | ICD-10-CM | POA: Diagnosis not present

## 2021-02-17 DIAGNOSIS — D509 Iron deficiency anemia, unspecified: Secondary | ICD-10-CM | POA: Diagnosis not present

## 2021-02-17 DIAGNOSIS — Z992 Dependence on renal dialysis: Secondary | ICD-10-CM | POA: Diagnosis not present

## 2021-02-17 DIAGNOSIS — D631 Anemia in chronic kidney disease: Secondary | ICD-10-CM | POA: Diagnosis not present

## 2021-02-20 DIAGNOSIS — Z992 Dependence on renal dialysis: Secondary | ICD-10-CM | POA: Diagnosis not present

## 2021-02-20 DIAGNOSIS — D631 Anemia in chronic kidney disease: Secondary | ICD-10-CM | POA: Diagnosis not present

## 2021-02-20 DIAGNOSIS — N2581 Secondary hyperparathyroidism of renal origin: Secondary | ICD-10-CM | POA: Diagnosis not present

## 2021-02-20 DIAGNOSIS — N186 End stage renal disease: Secondary | ICD-10-CM | POA: Diagnosis not present

## 2021-02-20 DIAGNOSIS — D509 Iron deficiency anemia, unspecified: Secondary | ICD-10-CM | POA: Diagnosis not present

## 2021-02-20 DIAGNOSIS — E119 Type 2 diabetes mellitus without complications: Secondary | ICD-10-CM | POA: Diagnosis not present

## 2021-02-22 DIAGNOSIS — D509 Iron deficiency anemia, unspecified: Secondary | ICD-10-CM | POA: Diagnosis not present

## 2021-02-22 DIAGNOSIS — E119 Type 2 diabetes mellitus without complications: Secondary | ICD-10-CM | POA: Diagnosis not present

## 2021-02-22 DIAGNOSIS — D631 Anemia in chronic kidney disease: Secondary | ICD-10-CM | POA: Diagnosis not present

## 2021-02-22 DIAGNOSIS — N2581 Secondary hyperparathyroidism of renal origin: Secondary | ICD-10-CM | POA: Diagnosis not present

## 2021-02-22 DIAGNOSIS — Z992 Dependence on renal dialysis: Secondary | ICD-10-CM | POA: Diagnosis not present

## 2021-02-22 DIAGNOSIS — N186 End stage renal disease: Secondary | ICD-10-CM | POA: Diagnosis not present

## 2021-02-24 DIAGNOSIS — Z992 Dependence on renal dialysis: Secondary | ICD-10-CM | POA: Diagnosis not present

## 2021-02-24 DIAGNOSIS — E119 Type 2 diabetes mellitus without complications: Secondary | ICD-10-CM | POA: Diagnosis not present

## 2021-02-24 DIAGNOSIS — N186 End stage renal disease: Secondary | ICD-10-CM | POA: Diagnosis not present

## 2021-02-24 DIAGNOSIS — D509 Iron deficiency anemia, unspecified: Secondary | ICD-10-CM | POA: Diagnosis not present

## 2021-02-24 DIAGNOSIS — D631 Anemia in chronic kidney disease: Secondary | ICD-10-CM | POA: Diagnosis not present

## 2021-02-24 DIAGNOSIS — N2581 Secondary hyperparathyroidism of renal origin: Secondary | ICD-10-CM | POA: Diagnosis not present

## 2021-02-27 DIAGNOSIS — D631 Anemia in chronic kidney disease: Secondary | ICD-10-CM | POA: Diagnosis not present

## 2021-02-27 DIAGNOSIS — D509 Iron deficiency anemia, unspecified: Secondary | ICD-10-CM | POA: Diagnosis not present

## 2021-02-27 DIAGNOSIS — N186 End stage renal disease: Secondary | ICD-10-CM | POA: Diagnosis not present

## 2021-02-27 DIAGNOSIS — E119 Type 2 diabetes mellitus without complications: Secondary | ICD-10-CM | POA: Diagnosis not present

## 2021-02-27 DIAGNOSIS — N2581 Secondary hyperparathyroidism of renal origin: Secondary | ICD-10-CM | POA: Diagnosis not present

## 2021-02-27 DIAGNOSIS — Z992 Dependence on renal dialysis: Secondary | ICD-10-CM | POA: Diagnosis not present

## 2021-03-01 DIAGNOSIS — Z992 Dependence on renal dialysis: Secondary | ICD-10-CM | POA: Diagnosis not present

## 2021-03-01 DIAGNOSIS — E119 Type 2 diabetes mellitus without complications: Secondary | ICD-10-CM | POA: Diagnosis not present

## 2021-03-01 DIAGNOSIS — D631 Anemia in chronic kidney disease: Secondary | ICD-10-CM | POA: Diagnosis not present

## 2021-03-01 DIAGNOSIS — N186 End stage renal disease: Secondary | ICD-10-CM | POA: Diagnosis not present

## 2021-03-01 DIAGNOSIS — N2581 Secondary hyperparathyroidism of renal origin: Secondary | ICD-10-CM | POA: Diagnosis not present

## 2021-03-01 DIAGNOSIS — D509 Iron deficiency anemia, unspecified: Secondary | ICD-10-CM | POA: Diagnosis not present

## 2021-03-03 DIAGNOSIS — N186 End stage renal disease: Secondary | ICD-10-CM | POA: Diagnosis not present

## 2021-03-03 DIAGNOSIS — D509 Iron deficiency anemia, unspecified: Secondary | ICD-10-CM | POA: Diagnosis not present

## 2021-03-03 DIAGNOSIS — Z992 Dependence on renal dialysis: Secondary | ICD-10-CM | POA: Diagnosis not present

## 2021-03-03 DIAGNOSIS — N2581 Secondary hyperparathyroidism of renal origin: Secondary | ICD-10-CM | POA: Diagnosis not present

## 2021-03-03 DIAGNOSIS — D631 Anemia in chronic kidney disease: Secondary | ICD-10-CM | POA: Diagnosis not present

## 2021-03-03 DIAGNOSIS — E119 Type 2 diabetes mellitus without complications: Secondary | ICD-10-CM | POA: Diagnosis not present

## 2021-03-06 DIAGNOSIS — D631 Anemia in chronic kidney disease: Secondary | ICD-10-CM | POA: Diagnosis not present

## 2021-03-06 DIAGNOSIS — E119 Type 2 diabetes mellitus without complications: Secondary | ICD-10-CM | POA: Diagnosis not present

## 2021-03-06 DIAGNOSIS — N186 End stage renal disease: Secondary | ICD-10-CM | POA: Diagnosis not present

## 2021-03-06 DIAGNOSIS — Z992 Dependence on renal dialysis: Secondary | ICD-10-CM | POA: Diagnosis not present

## 2021-03-06 DIAGNOSIS — D509 Iron deficiency anemia, unspecified: Secondary | ICD-10-CM | POA: Diagnosis not present

## 2021-03-06 DIAGNOSIS — N2581 Secondary hyperparathyroidism of renal origin: Secondary | ICD-10-CM | POA: Diagnosis not present

## 2021-03-08 DIAGNOSIS — N186 End stage renal disease: Secondary | ICD-10-CM | POA: Diagnosis not present

## 2021-03-08 DIAGNOSIS — D509 Iron deficiency anemia, unspecified: Secondary | ICD-10-CM | POA: Diagnosis not present

## 2021-03-08 DIAGNOSIS — D631 Anemia in chronic kidney disease: Secondary | ICD-10-CM | POA: Diagnosis not present

## 2021-03-08 DIAGNOSIS — Z992 Dependence on renal dialysis: Secondary | ICD-10-CM | POA: Diagnosis not present

## 2021-03-08 DIAGNOSIS — N2581 Secondary hyperparathyroidism of renal origin: Secondary | ICD-10-CM | POA: Diagnosis not present

## 2021-03-08 DIAGNOSIS — E119 Type 2 diabetes mellitus without complications: Secondary | ICD-10-CM | POA: Diagnosis not present

## 2021-03-09 DIAGNOSIS — Z992 Dependence on renal dialysis: Secondary | ICD-10-CM | POA: Diagnosis not present

## 2021-03-09 DIAGNOSIS — I120 Hypertensive chronic kidney disease with stage 5 chronic kidney disease or end stage renal disease: Secondary | ICD-10-CM | POA: Diagnosis not present

## 2021-03-09 DIAGNOSIS — N186 End stage renal disease: Secondary | ICD-10-CM | POA: Diagnosis not present

## 2021-03-10 DIAGNOSIS — N2581 Secondary hyperparathyroidism of renal origin: Secondary | ICD-10-CM | POA: Diagnosis not present

## 2021-03-10 DIAGNOSIS — D509 Iron deficiency anemia, unspecified: Secondary | ICD-10-CM | POA: Diagnosis not present

## 2021-03-10 DIAGNOSIS — D631 Anemia in chronic kidney disease: Secondary | ICD-10-CM | POA: Diagnosis not present

## 2021-03-10 DIAGNOSIS — N186 End stage renal disease: Secondary | ICD-10-CM | POA: Diagnosis not present

## 2021-03-10 DIAGNOSIS — E119 Type 2 diabetes mellitus without complications: Secondary | ICD-10-CM | POA: Diagnosis not present

## 2021-03-10 DIAGNOSIS — Z992 Dependence on renal dialysis: Secondary | ICD-10-CM | POA: Diagnosis not present

## 2021-03-13 DIAGNOSIS — Z992 Dependence on renal dialysis: Secondary | ICD-10-CM | POA: Diagnosis not present

## 2021-03-13 DIAGNOSIS — N186 End stage renal disease: Secondary | ICD-10-CM | POA: Diagnosis not present

## 2021-03-13 DIAGNOSIS — D631 Anemia in chronic kidney disease: Secondary | ICD-10-CM | POA: Diagnosis not present

## 2021-03-13 DIAGNOSIS — E119 Type 2 diabetes mellitus without complications: Secondary | ICD-10-CM | POA: Diagnosis not present

## 2021-03-13 DIAGNOSIS — D509 Iron deficiency anemia, unspecified: Secondary | ICD-10-CM | POA: Diagnosis not present

## 2021-03-13 DIAGNOSIS — N2581 Secondary hyperparathyroidism of renal origin: Secondary | ICD-10-CM | POA: Diagnosis not present

## 2021-03-15 DIAGNOSIS — E119 Type 2 diabetes mellitus without complications: Secondary | ICD-10-CM | POA: Diagnosis not present

## 2021-03-15 DIAGNOSIS — D509 Iron deficiency anemia, unspecified: Secondary | ICD-10-CM | POA: Diagnosis not present

## 2021-03-15 DIAGNOSIS — N2581 Secondary hyperparathyroidism of renal origin: Secondary | ICD-10-CM | POA: Diagnosis not present

## 2021-03-15 DIAGNOSIS — D631 Anemia in chronic kidney disease: Secondary | ICD-10-CM | POA: Diagnosis not present

## 2021-03-15 DIAGNOSIS — Z992 Dependence on renal dialysis: Secondary | ICD-10-CM | POA: Diagnosis not present

## 2021-03-15 DIAGNOSIS — N186 End stage renal disease: Secondary | ICD-10-CM | POA: Diagnosis not present

## 2021-03-17 DIAGNOSIS — N186 End stage renal disease: Secondary | ICD-10-CM | POA: Diagnosis not present

## 2021-03-17 DIAGNOSIS — D509 Iron deficiency anemia, unspecified: Secondary | ICD-10-CM | POA: Diagnosis not present

## 2021-03-17 DIAGNOSIS — E119 Type 2 diabetes mellitus without complications: Secondary | ICD-10-CM | POA: Diagnosis not present

## 2021-03-17 DIAGNOSIS — Z992 Dependence on renal dialysis: Secondary | ICD-10-CM | POA: Diagnosis not present

## 2021-03-17 DIAGNOSIS — N2581 Secondary hyperparathyroidism of renal origin: Secondary | ICD-10-CM | POA: Diagnosis not present

## 2021-03-17 DIAGNOSIS — D631 Anemia in chronic kidney disease: Secondary | ICD-10-CM | POA: Diagnosis not present

## 2021-03-20 ENCOUNTER — Other Ambulatory Visit: Payer: Self-pay | Admitting: Cardiology

## 2021-03-20 DIAGNOSIS — E119 Type 2 diabetes mellitus without complications: Secondary | ICD-10-CM | POA: Diagnosis not present

## 2021-03-20 DIAGNOSIS — D631 Anemia in chronic kidney disease: Secondary | ICD-10-CM | POA: Diagnosis not present

## 2021-03-20 DIAGNOSIS — D509 Iron deficiency anemia, unspecified: Secondary | ICD-10-CM | POA: Diagnosis not present

## 2021-03-20 DIAGNOSIS — E782 Mixed hyperlipidemia: Secondary | ICD-10-CM

## 2021-03-20 DIAGNOSIS — N186 End stage renal disease: Secondary | ICD-10-CM | POA: Diagnosis not present

## 2021-03-20 DIAGNOSIS — N2581 Secondary hyperparathyroidism of renal origin: Secondary | ICD-10-CM | POA: Diagnosis not present

## 2021-03-20 DIAGNOSIS — Z992 Dependence on renal dialysis: Secondary | ICD-10-CM | POA: Diagnosis not present

## 2021-03-22 DIAGNOSIS — D631 Anemia in chronic kidney disease: Secondary | ICD-10-CM | POA: Diagnosis not present

## 2021-03-22 DIAGNOSIS — N186 End stage renal disease: Secondary | ICD-10-CM | POA: Diagnosis not present

## 2021-03-22 DIAGNOSIS — Z992 Dependence on renal dialysis: Secondary | ICD-10-CM | POA: Diagnosis not present

## 2021-03-22 DIAGNOSIS — D509 Iron deficiency anemia, unspecified: Secondary | ICD-10-CM | POA: Diagnosis not present

## 2021-03-22 DIAGNOSIS — E119 Type 2 diabetes mellitus without complications: Secondary | ICD-10-CM | POA: Diagnosis not present

## 2021-03-22 DIAGNOSIS — N2581 Secondary hyperparathyroidism of renal origin: Secondary | ICD-10-CM | POA: Diagnosis not present

## 2021-03-24 DIAGNOSIS — D509 Iron deficiency anemia, unspecified: Secondary | ICD-10-CM | POA: Diagnosis not present

## 2021-03-24 DIAGNOSIS — E119 Type 2 diabetes mellitus without complications: Secondary | ICD-10-CM | POA: Diagnosis not present

## 2021-03-24 DIAGNOSIS — Z992 Dependence on renal dialysis: Secondary | ICD-10-CM | POA: Diagnosis not present

## 2021-03-24 DIAGNOSIS — N186 End stage renal disease: Secondary | ICD-10-CM | POA: Diagnosis not present

## 2021-03-24 DIAGNOSIS — N2581 Secondary hyperparathyroidism of renal origin: Secondary | ICD-10-CM | POA: Diagnosis not present

## 2021-03-24 DIAGNOSIS — D631 Anemia in chronic kidney disease: Secondary | ICD-10-CM | POA: Diagnosis not present

## 2021-03-27 DIAGNOSIS — N186 End stage renal disease: Secondary | ICD-10-CM | POA: Diagnosis not present

## 2021-03-27 DIAGNOSIS — Z992 Dependence on renal dialysis: Secondary | ICD-10-CM | POA: Diagnosis not present

## 2021-03-27 DIAGNOSIS — D509 Iron deficiency anemia, unspecified: Secondary | ICD-10-CM | POA: Diagnosis not present

## 2021-03-27 DIAGNOSIS — N2581 Secondary hyperparathyroidism of renal origin: Secondary | ICD-10-CM | POA: Diagnosis not present

## 2021-03-27 DIAGNOSIS — E119 Type 2 diabetes mellitus without complications: Secondary | ICD-10-CM | POA: Diagnosis not present

## 2021-03-27 DIAGNOSIS — D631 Anemia in chronic kidney disease: Secondary | ICD-10-CM | POA: Diagnosis not present

## 2021-03-29 DIAGNOSIS — N186 End stage renal disease: Secondary | ICD-10-CM | POA: Diagnosis not present

## 2021-03-29 DIAGNOSIS — N2581 Secondary hyperparathyroidism of renal origin: Secondary | ICD-10-CM | POA: Diagnosis not present

## 2021-03-29 DIAGNOSIS — Z992 Dependence on renal dialysis: Secondary | ICD-10-CM | POA: Diagnosis not present

## 2021-03-29 DIAGNOSIS — D631 Anemia in chronic kidney disease: Secondary | ICD-10-CM | POA: Diagnosis not present

## 2021-03-29 DIAGNOSIS — E119 Type 2 diabetes mellitus without complications: Secondary | ICD-10-CM | POA: Diagnosis not present

## 2021-03-29 DIAGNOSIS — D509 Iron deficiency anemia, unspecified: Secondary | ICD-10-CM | POA: Diagnosis not present

## 2021-03-30 DIAGNOSIS — Z992 Dependence on renal dialysis: Secondary | ICD-10-CM | POA: Diagnosis not present

## 2021-03-30 DIAGNOSIS — N2581 Secondary hyperparathyroidism of renal origin: Secondary | ICD-10-CM | POA: Diagnosis not present

## 2021-03-30 DIAGNOSIS — D631 Anemia in chronic kidney disease: Secondary | ICD-10-CM | POA: Diagnosis not present

## 2021-03-30 DIAGNOSIS — D509 Iron deficiency anemia, unspecified: Secondary | ICD-10-CM | POA: Diagnosis not present

## 2021-03-30 DIAGNOSIS — N186 End stage renal disease: Secondary | ICD-10-CM | POA: Diagnosis not present

## 2021-03-30 DIAGNOSIS — E119 Type 2 diabetes mellitus without complications: Secondary | ICD-10-CM | POA: Diagnosis not present

## 2021-04-03 DIAGNOSIS — N2581 Secondary hyperparathyroidism of renal origin: Secondary | ICD-10-CM | POA: Diagnosis not present

## 2021-04-03 DIAGNOSIS — D631 Anemia in chronic kidney disease: Secondary | ICD-10-CM | POA: Diagnosis not present

## 2021-04-03 DIAGNOSIS — E119 Type 2 diabetes mellitus without complications: Secondary | ICD-10-CM | POA: Diagnosis not present

## 2021-04-03 DIAGNOSIS — N186 End stage renal disease: Secondary | ICD-10-CM | POA: Diagnosis not present

## 2021-04-03 DIAGNOSIS — D509 Iron deficiency anemia, unspecified: Secondary | ICD-10-CM | POA: Diagnosis not present

## 2021-04-03 DIAGNOSIS — Z992 Dependence on renal dialysis: Secondary | ICD-10-CM | POA: Diagnosis not present

## 2021-04-05 DIAGNOSIS — Z992 Dependence on renal dialysis: Secondary | ICD-10-CM | POA: Diagnosis not present

## 2021-04-05 DIAGNOSIS — E119 Type 2 diabetes mellitus without complications: Secondary | ICD-10-CM | POA: Diagnosis not present

## 2021-04-05 DIAGNOSIS — D509 Iron deficiency anemia, unspecified: Secondary | ICD-10-CM | POA: Diagnosis not present

## 2021-04-05 DIAGNOSIS — D631 Anemia in chronic kidney disease: Secondary | ICD-10-CM | POA: Diagnosis not present

## 2021-04-05 DIAGNOSIS — N2581 Secondary hyperparathyroidism of renal origin: Secondary | ICD-10-CM | POA: Diagnosis not present

## 2021-04-05 DIAGNOSIS — N186 End stage renal disease: Secondary | ICD-10-CM | POA: Diagnosis not present

## 2021-04-07 DIAGNOSIS — D631 Anemia in chronic kidney disease: Secondary | ICD-10-CM | POA: Diagnosis not present

## 2021-04-07 DIAGNOSIS — D509 Iron deficiency anemia, unspecified: Secondary | ICD-10-CM | POA: Diagnosis not present

## 2021-04-07 DIAGNOSIS — N186 End stage renal disease: Secondary | ICD-10-CM | POA: Diagnosis not present

## 2021-04-07 DIAGNOSIS — Z992 Dependence on renal dialysis: Secondary | ICD-10-CM | POA: Diagnosis not present

## 2021-04-07 DIAGNOSIS — N2581 Secondary hyperparathyroidism of renal origin: Secondary | ICD-10-CM | POA: Diagnosis not present

## 2021-04-07 DIAGNOSIS — E119 Type 2 diabetes mellitus without complications: Secondary | ICD-10-CM | POA: Diagnosis not present

## 2021-04-08 DIAGNOSIS — I120 Hypertensive chronic kidney disease with stage 5 chronic kidney disease or end stage renal disease: Secondary | ICD-10-CM | POA: Diagnosis not present

## 2021-04-08 DIAGNOSIS — N186 End stage renal disease: Secondary | ICD-10-CM | POA: Diagnosis not present

## 2021-04-08 DIAGNOSIS — Z992 Dependence on renal dialysis: Secondary | ICD-10-CM | POA: Diagnosis not present

## 2021-04-10 DIAGNOSIS — N2581 Secondary hyperparathyroidism of renal origin: Secondary | ICD-10-CM | POA: Diagnosis not present

## 2021-04-10 DIAGNOSIS — E119 Type 2 diabetes mellitus without complications: Secondary | ICD-10-CM | POA: Diagnosis not present

## 2021-04-10 DIAGNOSIS — Z992 Dependence on renal dialysis: Secondary | ICD-10-CM | POA: Diagnosis not present

## 2021-04-10 DIAGNOSIS — N186 End stage renal disease: Secondary | ICD-10-CM | POA: Diagnosis not present

## 2021-04-10 DIAGNOSIS — D631 Anemia in chronic kidney disease: Secondary | ICD-10-CM | POA: Diagnosis not present

## 2021-04-10 DIAGNOSIS — Z23 Encounter for immunization: Secondary | ICD-10-CM | POA: Diagnosis not present

## 2021-04-12 DIAGNOSIS — Z992 Dependence on renal dialysis: Secondary | ICD-10-CM | POA: Diagnosis not present

## 2021-04-12 DIAGNOSIS — N2581 Secondary hyperparathyroidism of renal origin: Secondary | ICD-10-CM | POA: Diagnosis not present

## 2021-04-12 DIAGNOSIS — D631 Anemia in chronic kidney disease: Secondary | ICD-10-CM | POA: Diagnosis not present

## 2021-04-12 DIAGNOSIS — N186 End stage renal disease: Secondary | ICD-10-CM | POA: Diagnosis not present

## 2021-04-12 DIAGNOSIS — Z23 Encounter for immunization: Secondary | ICD-10-CM | POA: Diagnosis not present

## 2021-04-12 DIAGNOSIS — E119 Type 2 diabetes mellitus without complications: Secondary | ICD-10-CM | POA: Diagnosis not present

## 2021-04-14 DIAGNOSIS — Z992 Dependence on renal dialysis: Secondary | ICD-10-CM | POA: Diagnosis not present

## 2021-04-14 DIAGNOSIS — N2581 Secondary hyperparathyroidism of renal origin: Secondary | ICD-10-CM | POA: Diagnosis not present

## 2021-04-14 DIAGNOSIS — D631 Anemia in chronic kidney disease: Secondary | ICD-10-CM | POA: Diagnosis not present

## 2021-04-14 DIAGNOSIS — N186 End stage renal disease: Secondary | ICD-10-CM | POA: Diagnosis not present

## 2021-04-14 DIAGNOSIS — E119 Type 2 diabetes mellitus without complications: Secondary | ICD-10-CM | POA: Diagnosis not present

## 2021-04-14 DIAGNOSIS — Z23 Encounter for immunization: Secondary | ICD-10-CM | POA: Diagnosis not present

## 2021-04-17 DIAGNOSIS — Z23 Encounter for immunization: Secondary | ICD-10-CM | POA: Diagnosis not present

## 2021-04-17 DIAGNOSIS — D631 Anemia in chronic kidney disease: Secondary | ICD-10-CM | POA: Diagnosis not present

## 2021-04-17 DIAGNOSIS — N2581 Secondary hyperparathyroidism of renal origin: Secondary | ICD-10-CM | POA: Diagnosis not present

## 2021-04-17 DIAGNOSIS — N186 End stage renal disease: Secondary | ICD-10-CM | POA: Diagnosis not present

## 2021-04-17 DIAGNOSIS — E119 Type 2 diabetes mellitus without complications: Secondary | ICD-10-CM | POA: Diagnosis not present

## 2021-04-17 DIAGNOSIS — Z992 Dependence on renal dialysis: Secondary | ICD-10-CM | POA: Diagnosis not present

## 2021-04-19 DIAGNOSIS — E119 Type 2 diabetes mellitus without complications: Secondary | ICD-10-CM | POA: Diagnosis not present

## 2021-04-19 DIAGNOSIS — D631 Anemia in chronic kidney disease: Secondary | ICD-10-CM | POA: Diagnosis not present

## 2021-04-19 DIAGNOSIS — N186 End stage renal disease: Secondary | ICD-10-CM | POA: Diagnosis not present

## 2021-04-19 DIAGNOSIS — Z992 Dependence on renal dialysis: Secondary | ICD-10-CM | POA: Diagnosis not present

## 2021-04-19 DIAGNOSIS — N2581 Secondary hyperparathyroidism of renal origin: Secondary | ICD-10-CM | POA: Diagnosis not present

## 2021-04-19 DIAGNOSIS — Z23 Encounter for immunization: Secondary | ICD-10-CM | POA: Diagnosis not present

## 2021-04-21 DIAGNOSIS — N186 End stage renal disease: Secondary | ICD-10-CM | POA: Diagnosis not present

## 2021-04-21 DIAGNOSIS — N2581 Secondary hyperparathyroidism of renal origin: Secondary | ICD-10-CM | POA: Diagnosis not present

## 2021-04-21 DIAGNOSIS — Z23 Encounter for immunization: Secondary | ICD-10-CM | POA: Diagnosis not present

## 2021-04-21 DIAGNOSIS — Z992 Dependence on renal dialysis: Secondary | ICD-10-CM | POA: Diagnosis not present

## 2021-04-21 DIAGNOSIS — E119 Type 2 diabetes mellitus without complications: Secondary | ICD-10-CM | POA: Diagnosis not present

## 2021-04-21 DIAGNOSIS — D631 Anemia in chronic kidney disease: Secondary | ICD-10-CM | POA: Diagnosis not present

## 2021-04-24 DIAGNOSIS — N186 End stage renal disease: Secondary | ICD-10-CM | POA: Diagnosis not present

## 2021-04-24 DIAGNOSIS — Z23 Encounter for immunization: Secondary | ICD-10-CM | POA: Diagnosis not present

## 2021-04-24 DIAGNOSIS — E119 Type 2 diabetes mellitus without complications: Secondary | ICD-10-CM | POA: Diagnosis not present

## 2021-04-24 DIAGNOSIS — D631 Anemia in chronic kidney disease: Secondary | ICD-10-CM | POA: Diagnosis not present

## 2021-04-24 DIAGNOSIS — N2581 Secondary hyperparathyroidism of renal origin: Secondary | ICD-10-CM | POA: Diagnosis not present

## 2021-04-24 DIAGNOSIS — Z992 Dependence on renal dialysis: Secondary | ICD-10-CM | POA: Diagnosis not present

## 2021-04-26 DIAGNOSIS — Z23 Encounter for immunization: Secondary | ICD-10-CM | POA: Diagnosis not present

## 2021-04-26 DIAGNOSIS — D631 Anemia in chronic kidney disease: Secondary | ICD-10-CM | POA: Diagnosis not present

## 2021-04-26 DIAGNOSIS — Z992 Dependence on renal dialysis: Secondary | ICD-10-CM | POA: Diagnosis not present

## 2021-04-26 DIAGNOSIS — E119 Type 2 diabetes mellitus without complications: Secondary | ICD-10-CM | POA: Diagnosis not present

## 2021-04-26 DIAGNOSIS — N186 End stage renal disease: Secondary | ICD-10-CM | POA: Diagnosis not present

## 2021-04-26 DIAGNOSIS — E1129 Type 2 diabetes mellitus with other diabetic kidney complication: Secondary | ICD-10-CM | POA: Diagnosis not present

## 2021-04-26 DIAGNOSIS — N2581 Secondary hyperparathyroidism of renal origin: Secondary | ICD-10-CM | POA: Diagnosis not present

## 2021-04-28 DIAGNOSIS — N2581 Secondary hyperparathyroidism of renal origin: Secondary | ICD-10-CM | POA: Diagnosis not present

## 2021-04-28 DIAGNOSIS — N186 End stage renal disease: Secondary | ICD-10-CM | POA: Diagnosis not present

## 2021-04-28 DIAGNOSIS — Z992 Dependence on renal dialysis: Secondary | ICD-10-CM | POA: Diagnosis not present

## 2021-04-28 DIAGNOSIS — E119 Type 2 diabetes mellitus without complications: Secondary | ICD-10-CM | POA: Diagnosis not present

## 2021-04-28 DIAGNOSIS — Z23 Encounter for immunization: Secondary | ICD-10-CM | POA: Diagnosis not present

## 2021-04-28 DIAGNOSIS — D631 Anemia in chronic kidney disease: Secondary | ICD-10-CM | POA: Diagnosis not present

## 2021-05-01 DIAGNOSIS — D631 Anemia in chronic kidney disease: Secondary | ICD-10-CM | POA: Diagnosis not present

## 2021-05-01 DIAGNOSIS — E119 Type 2 diabetes mellitus without complications: Secondary | ICD-10-CM | POA: Diagnosis not present

## 2021-05-01 DIAGNOSIS — Z992 Dependence on renal dialysis: Secondary | ICD-10-CM | POA: Diagnosis not present

## 2021-05-01 DIAGNOSIS — N2581 Secondary hyperparathyroidism of renal origin: Secondary | ICD-10-CM | POA: Diagnosis not present

## 2021-05-01 DIAGNOSIS — Z23 Encounter for immunization: Secondary | ICD-10-CM | POA: Diagnosis not present

## 2021-05-01 DIAGNOSIS — N186 End stage renal disease: Secondary | ICD-10-CM | POA: Diagnosis not present

## 2021-05-02 ENCOUNTER — Other Ambulatory Visit: Payer: Self-pay

## 2021-05-02 ENCOUNTER — Ambulatory Visit (INDEPENDENT_AMBULATORY_CARE_PROVIDER_SITE_OTHER): Payer: Medicare Other | Admitting: Podiatry

## 2021-05-02 ENCOUNTER — Encounter: Payer: Self-pay | Admitting: Podiatry

## 2021-05-02 DIAGNOSIS — E1151 Type 2 diabetes mellitus with diabetic peripheral angiopathy without gangrene: Secondary | ICD-10-CM | POA: Diagnosis not present

## 2021-05-02 DIAGNOSIS — B351 Tinea unguium: Secondary | ICD-10-CM | POA: Diagnosis not present

## 2021-05-02 DIAGNOSIS — N186 End stage renal disease: Secondary | ICD-10-CM

## 2021-05-02 DIAGNOSIS — M79674 Pain in right toe(s): Secondary | ICD-10-CM

## 2021-05-02 DIAGNOSIS — Z992 Dependence on renal dialysis: Secondary | ICD-10-CM

## 2021-05-02 DIAGNOSIS — M79675 Pain in left toe(s): Secondary | ICD-10-CM

## 2021-05-03 DIAGNOSIS — Z23 Encounter for immunization: Secondary | ICD-10-CM | POA: Diagnosis not present

## 2021-05-03 DIAGNOSIS — N2581 Secondary hyperparathyroidism of renal origin: Secondary | ICD-10-CM | POA: Diagnosis not present

## 2021-05-03 DIAGNOSIS — Z992 Dependence on renal dialysis: Secondary | ICD-10-CM | POA: Diagnosis not present

## 2021-05-03 DIAGNOSIS — N186 End stage renal disease: Secondary | ICD-10-CM | POA: Diagnosis not present

## 2021-05-03 DIAGNOSIS — D631 Anemia in chronic kidney disease: Secondary | ICD-10-CM | POA: Diagnosis not present

## 2021-05-03 DIAGNOSIS — E119 Type 2 diabetes mellitus without complications: Secondary | ICD-10-CM | POA: Diagnosis not present

## 2021-05-05 DIAGNOSIS — N186 End stage renal disease: Secondary | ICD-10-CM | POA: Diagnosis not present

## 2021-05-05 DIAGNOSIS — E119 Type 2 diabetes mellitus without complications: Secondary | ICD-10-CM | POA: Diagnosis not present

## 2021-05-05 DIAGNOSIS — Z23 Encounter for immunization: Secondary | ICD-10-CM | POA: Diagnosis not present

## 2021-05-05 DIAGNOSIS — D631 Anemia in chronic kidney disease: Secondary | ICD-10-CM | POA: Diagnosis not present

## 2021-05-05 DIAGNOSIS — N2581 Secondary hyperparathyroidism of renal origin: Secondary | ICD-10-CM | POA: Diagnosis not present

## 2021-05-05 DIAGNOSIS — Z992 Dependence on renal dialysis: Secondary | ICD-10-CM | POA: Diagnosis not present

## 2021-05-07 NOTE — Progress Notes (Signed)
  Subjective:  Patient ID: Shawn [name], male    DOB: 07/07/1961,  MRN: [id number]  Shawn [name] presents to clinic today for at risk foot care with h/o NIDDM with ESRD on hemodialysis and thick, elongated toenails b/l feet which are tender when wearing enclosed shoe gear.  Patient states blood glucose was 145-150 mg/dl today.    He is accompanied by his wife on today's visit.  PCP is Vincente Liberty, MD , and last visit was July, 2022.  No Known Allergies  Review of Systems: Negative except as noted in the HPI. Objective:   Constitutional Shawn [name] is a pleasant 60 y.o. African American male, in NAD. AAO x 3.   Vascular CFT <3 seconds b/l LE. Nonpalpable pedal pulses b/l. Pedal hair absent b/l. Skin temperature gradient WNL b/l. No pain with calf compression b/l. No edema b/l LE. Pedal hair present. Lower extremity skin temperature gradient within normal limits. No cyanosis or clubbing noted.  Neurologic Normal speech. Oriented to person, place, and time. Protective sensation diminished with 10g monofilament b/l.  Dermatologic Pedal skin thin, shiny and atrophic b/l LE. No open wounds b/l LE. No interdigital macerations noted b/l LE. Toenails 1-5 b/l elongated, discolored, dystrophic, thickened, crumbly with subungual debris and tenderness to dorsal palpation. Incurvated nailplate bilateral border(s) L hallux and R hallux.  Nail border hypertrophy absent. There is tenderness to palpation. Sign(s) of infection: no clinical signs of infection noted on examination today.. No hyperkeratotic nor porokeratotic lesions present on today's visit.  Orthopedic: Normal muscle strength 5/5 to all lower extremity muscle groups bilaterally. Hammertoe(s) noted to the L 2nd toe and R 2nd toe.   Radiographs: None Assessment:   1. Pain due to onychomycosis of toenails of both feet   2. ESRD on dialysis ([place])   3. Type II diabetes mellitus with peripheral circulatory disorder  [facility])    Plan:  Patient was evaluated and treated and all questions answered. Consent given for treatment as described below: -Continue diabetic foot care principles: inspect feet daily, monitor glucose as recommended by PCP and/or Endocrinologist, and follow prescribed diet per PCP, Endocrinologist and/or dietician. -Toenails 1-5 b/l were debrided in length and girth with sterile nail nippers and dremel without iatrogenic bleeding.  -Offending nail border debrided and curretaged L hallux and R hallux utilizing sterile nail nipper and currette. Border(s) cleansed with alcohol and TAO applied. Patient instructed to apply Neosporin to L hallux and R hallux once daily for 7 days. -Patient/POA to call should there be question/concern in the interim.  Return in about 3 months (around 08/02/2021).  Marzetta Board, DPM

## 2021-05-08 DIAGNOSIS — N2581 Secondary hyperparathyroidism of renal origin: Secondary | ICD-10-CM | POA: Diagnosis not present

## 2021-05-08 DIAGNOSIS — N186 End stage renal disease: Secondary | ICD-10-CM | POA: Diagnosis not present

## 2021-05-08 DIAGNOSIS — Z992 Dependence on renal dialysis: Secondary | ICD-10-CM | POA: Diagnosis not present

## 2021-05-08 DIAGNOSIS — Z23 Encounter for immunization: Secondary | ICD-10-CM | POA: Diagnosis not present

## 2021-05-08 DIAGNOSIS — D631 Anemia in chronic kidney disease: Secondary | ICD-10-CM | POA: Diagnosis not present

## 2021-05-08 DIAGNOSIS — E119 Type 2 diabetes mellitus without complications: Secondary | ICD-10-CM | POA: Diagnosis not present

## 2021-05-09 DIAGNOSIS — I120 Hypertensive chronic kidney disease with stage 5 chronic kidney disease or end stage renal disease: Secondary | ICD-10-CM | POA: Diagnosis not present

## 2021-05-09 DIAGNOSIS — Z992 Dependence on renal dialysis: Secondary | ICD-10-CM | POA: Diagnosis not present

## 2021-05-09 DIAGNOSIS — N186 End stage renal disease: Secondary | ICD-10-CM | POA: Diagnosis not present

## 2021-05-10 DIAGNOSIS — E119 Type 2 diabetes mellitus without complications: Secondary | ICD-10-CM | POA: Diagnosis not present

## 2021-05-10 DIAGNOSIS — Z992 Dependence on renal dialysis: Secondary | ICD-10-CM | POA: Diagnosis not present

## 2021-05-10 DIAGNOSIS — N2581 Secondary hyperparathyroidism of renal origin: Secondary | ICD-10-CM | POA: Diagnosis not present

## 2021-05-10 DIAGNOSIS — N186 End stage renal disease: Secondary | ICD-10-CM | POA: Diagnosis not present

## 2021-05-10 DIAGNOSIS — D631 Anemia in chronic kidney disease: Secondary | ICD-10-CM | POA: Diagnosis not present

## 2021-05-10 DIAGNOSIS — D509 Iron deficiency anemia, unspecified: Secondary | ICD-10-CM | POA: Diagnosis not present

## 2021-05-12 DIAGNOSIS — N2581 Secondary hyperparathyroidism of renal origin: Secondary | ICD-10-CM | POA: Diagnosis not present

## 2021-05-12 DIAGNOSIS — N186 End stage renal disease: Secondary | ICD-10-CM | POA: Diagnosis not present

## 2021-05-12 DIAGNOSIS — D509 Iron deficiency anemia, unspecified: Secondary | ICD-10-CM | POA: Diagnosis not present

## 2021-05-12 DIAGNOSIS — E119 Type 2 diabetes mellitus without complications: Secondary | ICD-10-CM | POA: Diagnosis not present

## 2021-05-12 DIAGNOSIS — Z992 Dependence on renal dialysis: Secondary | ICD-10-CM | POA: Diagnosis not present

## 2021-05-12 DIAGNOSIS — D631 Anemia in chronic kidney disease: Secondary | ICD-10-CM | POA: Diagnosis not present

## 2021-05-15 DIAGNOSIS — N2581 Secondary hyperparathyroidism of renal origin: Secondary | ICD-10-CM | POA: Diagnosis not present

## 2021-05-15 DIAGNOSIS — E119 Type 2 diabetes mellitus without complications: Secondary | ICD-10-CM | POA: Diagnosis not present

## 2021-05-15 DIAGNOSIS — Z992 Dependence on renal dialysis: Secondary | ICD-10-CM | POA: Diagnosis not present

## 2021-05-15 DIAGNOSIS — N186 End stage renal disease: Secondary | ICD-10-CM | POA: Diagnosis not present

## 2021-05-15 DIAGNOSIS — D631 Anemia in chronic kidney disease: Secondary | ICD-10-CM | POA: Diagnosis not present

## 2021-05-15 DIAGNOSIS — D509 Iron deficiency anemia, unspecified: Secondary | ICD-10-CM | POA: Diagnosis not present

## 2021-05-17 DIAGNOSIS — E119 Type 2 diabetes mellitus without complications: Secondary | ICD-10-CM | POA: Diagnosis not present

## 2021-05-17 DIAGNOSIS — Z992 Dependence on renal dialysis: Secondary | ICD-10-CM | POA: Diagnosis not present

## 2021-05-17 DIAGNOSIS — D631 Anemia in chronic kidney disease: Secondary | ICD-10-CM | POA: Diagnosis not present

## 2021-05-17 DIAGNOSIS — D509 Iron deficiency anemia, unspecified: Secondary | ICD-10-CM | POA: Diagnosis not present

## 2021-05-17 DIAGNOSIS — N186 End stage renal disease: Secondary | ICD-10-CM | POA: Diagnosis not present

## 2021-05-17 DIAGNOSIS — N2581 Secondary hyperparathyroidism of renal origin: Secondary | ICD-10-CM | POA: Diagnosis not present

## 2021-05-19 DIAGNOSIS — Z992 Dependence on renal dialysis: Secondary | ICD-10-CM | POA: Diagnosis not present

## 2021-05-19 DIAGNOSIS — E119 Type 2 diabetes mellitus without complications: Secondary | ICD-10-CM | POA: Diagnosis not present

## 2021-05-19 DIAGNOSIS — N2581 Secondary hyperparathyroidism of renal origin: Secondary | ICD-10-CM | POA: Diagnosis not present

## 2021-05-19 DIAGNOSIS — D509 Iron deficiency anemia, unspecified: Secondary | ICD-10-CM | POA: Diagnosis not present

## 2021-05-19 DIAGNOSIS — D631 Anemia in chronic kidney disease: Secondary | ICD-10-CM | POA: Diagnosis not present

## 2021-05-19 DIAGNOSIS — N186 End stage renal disease: Secondary | ICD-10-CM | POA: Diagnosis not present

## 2021-05-22 DIAGNOSIS — E119 Type 2 diabetes mellitus without complications: Secondary | ICD-10-CM | POA: Diagnosis not present

## 2021-05-22 DIAGNOSIS — N186 End stage renal disease: Secondary | ICD-10-CM | POA: Diagnosis not present

## 2021-05-22 DIAGNOSIS — N2581 Secondary hyperparathyroidism of renal origin: Secondary | ICD-10-CM | POA: Diagnosis not present

## 2021-05-22 DIAGNOSIS — Z992 Dependence on renal dialysis: Secondary | ICD-10-CM | POA: Diagnosis not present

## 2021-05-22 DIAGNOSIS — D509 Iron deficiency anemia, unspecified: Secondary | ICD-10-CM | POA: Diagnosis not present

## 2021-05-22 DIAGNOSIS — D631 Anemia in chronic kidney disease: Secondary | ICD-10-CM | POA: Diagnosis not present

## 2021-05-24 DIAGNOSIS — D631 Anemia in chronic kidney disease: Secondary | ICD-10-CM | POA: Diagnosis not present

## 2021-05-24 DIAGNOSIS — N186 End stage renal disease: Secondary | ICD-10-CM | POA: Diagnosis not present

## 2021-05-24 DIAGNOSIS — N2581 Secondary hyperparathyroidism of renal origin: Secondary | ICD-10-CM | POA: Diagnosis not present

## 2021-05-24 DIAGNOSIS — E119 Type 2 diabetes mellitus without complications: Secondary | ICD-10-CM | POA: Diagnosis not present

## 2021-05-24 DIAGNOSIS — Z992 Dependence on renal dialysis: Secondary | ICD-10-CM | POA: Diagnosis not present

## 2021-05-24 DIAGNOSIS — D509 Iron deficiency anemia, unspecified: Secondary | ICD-10-CM | POA: Diagnosis not present

## 2021-05-26 DIAGNOSIS — N186 End stage renal disease: Secondary | ICD-10-CM | POA: Diagnosis not present

## 2021-05-26 DIAGNOSIS — D509 Iron deficiency anemia, unspecified: Secondary | ICD-10-CM | POA: Diagnosis not present

## 2021-05-26 DIAGNOSIS — Z992 Dependence on renal dialysis: Secondary | ICD-10-CM | POA: Diagnosis not present

## 2021-05-26 DIAGNOSIS — D631 Anemia in chronic kidney disease: Secondary | ICD-10-CM | POA: Diagnosis not present

## 2021-05-26 DIAGNOSIS — E119 Type 2 diabetes mellitus without complications: Secondary | ICD-10-CM | POA: Diagnosis not present

## 2021-05-26 DIAGNOSIS — N2581 Secondary hyperparathyroidism of renal origin: Secondary | ICD-10-CM | POA: Diagnosis not present

## 2021-05-29 DIAGNOSIS — Z992 Dependence on renal dialysis: Secondary | ICD-10-CM | POA: Diagnosis not present

## 2021-05-29 DIAGNOSIS — D631 Anemia in chronic kidney disease: Secondary | ICD-10-CM | POA: Diagnosis not present

## 2021-05-29 DIAGNOSIS — N186 End stage renal disease: Secondary | ICD-10-CM | POA: Diagnosis not present

## 2021-05-29 DIAGNOSIS — D509 Iron deficiency anemia, unspecified: Secondary | ICD-10-CM | POA: Diagnosis not present

## 2021-05-29 DIAGNOSIS — E119 Type 2 diabetes mellitus without complications: Secondary | ICD-10-CM | POA: Diagnosis not present

## 2021-05-29 DIAGNOSIS — N2581 Secondary hyperparathyroidism of renal origin: Secondary | ICD-10-CM | POA: Diagnosis not present

## 2021-05-30 DIAGNOSIS — H401133 Primary open-angle glaucoma, bilateral, severe stage: Secondary | ICD-10-CM | POA: Diagnosis not present

## 2021-05-30 DIAGNOSIS — H16122 Filamentary keratitis, left eye: Secondary | ICD-10-CM | POA: Diagnosis not present

## 2021-05-31 DIAGNOSIS — D509 Iron deficiency anemia, unspecified: Secondary | ICD-10-CM | POA: Diagnosis not present

## 2021-05-31 DIAGNOSIS — N2581 Secondary hyperparathyroidism of renal origin: Secondary | ICD-10-CM | POA: Diagnosis not present

## 2021-05-31 DIAGNOSIS — E119 Type 2 diabetes mellitus without complications: Secondary | ICD-10-CM | POA: Diagnosis not present

## 2021-05-31 DIAGNOSIS — N186 End stage renal disease: Secondary | ICD-10-CM | POA: Diagnosis not present

## 2021-05-31 DIAGNOSIS — D631 Anemia in chronic kidney disease: Secondary | ICD-10-CM | POA: Diagnosis not present

## 2021-05-31 DIAGNOSIS — Z992 Dependence on renal dialysis: Secondary | ICD-10-CM | POA: Diagnosis not present

## 2021-06-03 DIAGNOSIS — D631 Anemia in chronic kidney disease: Secondary | ICD-10-CM | POA: Diagnosis not present

## 2021-06-03 DIAGNOSIS — N2581 Secondary hyperparathyroidism of renal origin: Secondary | ICD-10-CM | POA: Diagnosis not present

## 2021-06-03 DIAGNOSIS — D509 Iron deficiency anemia, unspecified: Secondary | ICD-10-CM | POA: Diagnosis not present

## 2021-06-03 DIAGNOSIS — N186 End stage renal disease: Secondary | ICD-10-CM | POA: Diagnosis not present

## 2021-06-03 DIAGNOSIS — E119 Type 2 diabetes mellitus without complications: Secondary | ICD-10-CM | POA: Diagnosis not present

## 2021-06-03 DIAGNOSIS — Z992 Dependence on renal dialysis: Secondary | ICD-10-CM | POA: Diagnosis not present

## 2021-06-05 DIAGNOSIS — D631 Anemia in chronic kidney disease: Secondary | ICD-10-CM | POA: Diagnosis not present

## 2021-06-05 DIAGNOSIS — Z992 Dependence on renal dialysis: Secondary | ICD-10-CM | POA: Diagnosis not present

## 2021-06-05 DIAGNOSIS — N186 End stage renal disease: Secondary | ICD-10-CM | POA: Diagnosis not present

## 2021-06-05 DIAGNOSIS — N2581 Secondary hyperparathyroidism of renal origin: Secondary | ICD-10-CM | POA: Diagnosis not present

## 2021-06-05 DIAGNOSIS — E119 Type 2 diabetes mellitus without complications: Secondary | ICD-10-CM | POA: Diagnosis not present

## 2021-06-05 DIAGNOSIS — D509 Iron deficiency anemia, unspecified: Secondary | ICD-10-CM | POA: Diagnosis not present

## 2021-06-06 DIAGNOSIS — R55 Syncope and collapse: Secondary | ICD-10-CM | POA: Diagnosis not present

## 2021-06-06 DIAGNOSIS — E559 Vitamin D deficiency, unspecified: Secondary | ICD-10-CM | POA: Diagnosis not present

## 2021-06-06 DIAGNOSIS — M2011 Hallux valgus (acquired), right foot: Secondary | ICD-10-CM | POA: Diagnosis not present

## 2021-06-06 DIAGNOSIS — M7552 Bursitis of left shoulder: Secondary | ICD-10-CM | POA: Diagnosis not present

## 2021-06-06 DIAGNOSIS — H547 Unspecified visual loss: Secondary | ICD-10-CM | POA: Diagnosis not present

## 2021-06-06 DIAGNOSIS — Z79891 Long term (current) use of opiate analgesic: Secondary | ICD-10-CM | POA: Diagnosis not present

## 2021-06-06 DIAGNOSIS — I119 Hypertensive heart disease without heart failure: Secondary | ICD-10-CM | POA: Diagnosis not present

## 2021-06-06 DIAGNOSIS — E7800 Pure hypercholesterolemia, unspecified: Secondary | ICD-10-CM | POA: Diagnosis not present

## 2021-06-06 DIAGNOSIS — I999 Unspecified disorder of circulatory system: Secondary | ICD-10-CM | POA: Diagnosis not present

## 2021-06-06 DIAGNOSIS — E1165 Type 2 diabetes mellitus with hyperglycemia: Secondary | ICD-10-CM | POA: Diagnosis not present

## 2021-06-06 DIAGNOSIS — I6990 Unspecified sequelae of unspecified cerebrovascular disease: Secondary | ICD-10-CM | POA: Diagnosis not present

## 2021-06-07 DIAGNOSIS — D631 Anemia in chronic kidney disease: Secondary | ICD-10-CM | POA: Diagnosis not present

## 2021-06-07 DIAGNOSIS — N2581 Secondary hyperparathyroidism of renal origin: Secondary | ICD-10-CM | POA: Diagnosis not present

## 2021-06-07 DIAGNOSIS — N186 End stage renal disease: Secondary | ICD-10-CM | POA: Diagnosis not present

## 2021-06-07 DIAGNOSIS — E119 Type 2 diabetes mellitus without complications: Secondary | ICD-10-CM | POA: Diagnosis not present

## 2021-06-07 DIAGNOSIS — Z992 Dependence on renal dialysis: Secondary | ICD-10-CM | POA: Diagnosis not present

## 2021-06-07 DIAGNOSIS — D509 Iron deficiency anemia, unspecified: Secondary | ICD-10-CM | POA: Diagnosis not present

## 2021-06-08 DIAGNOSIS — I120 Hypertensive chronic kidney disease with stage 5 chronic kidney disease or end stage renal disease: Secondary | ICD-10-CM | POA: Diagnosis not present

## 2021-06-08 DIAGNOSIS — Z992 Dependence on renal dialysis: Secondary | ICD-10-CM | POA: Diagnosis not present

## 2021-06-08 DIAGNOSIS — N186 End stage renal disease: Secondary | ICD-10-CM | POA: Diagnosis not present

## 2021-06-09 DIAGNOSIS — N186 End stage renal disease: Secondary | ICD-10-CM | POA: Diagnosis not present

## 2021-06-09 DIAGNOSIS — N2581 Secondary hyperparathyroidism of renal origin: Secondary | ICD-10-CM | POA: Diagnosis not present

## 2021-06-09 DIAGNOSIS — D509 Iron deficiency anemia, unspecified: Secondary | ICD-10-CM | POA: Diagnosis not present

## 2021-06-09 DIAGNOSIS — D631 Anemia in chronic kidney disease: Secondary | ICD-10-CM | POA: Diagnosis not present

## 2021-06-09 DIAGNOSIS — Z992 Dependence on renal dialysis: Secondary | ICD-10-CM | POA: Diagnosis not present

## 2021-06-09 DIAGNOSIS — E119 Type 2 diabetes mellitus without complications: Secondary | ICD-10-CM | POA: Diagnosis not present

## 2021-06-12 DIAGNOSIS — D509 Iron deficiency anemia, unspecified: Secondary | ICD-10-CM | POA: Diagnosis not present

## 2021-06-12 DIAGNOSIS — E119 Type 2 diabetes mellitus without complications: Secondary | ICD-10-CM | POA: Diagnosis not present

## 2021-06-12 DIAGNOSIS — N2581 Secondary hyperparathyroidism of renal origin: Secondary | ICD-10-CM | POA: Diagnosis not present

## 2021-06-12 DIAGNOSIS — Z992 Dependence on renal dialysis: Secondary | ICD-10-CM | POA: Diagnosis not present

## 2021-06-12 DIAGNOSIS — N186 End stage renal disease: Secondary | ICD-10-CM | POA: Diagnosis not present

## 2021-06-12 DIAGNOSIS — D631 Anemia in chronic kidney disease: Secondary | ICD-10-CM | POA: Diagnosis not present

## 2021-06-14 DIAGNOSIS — N186 End stage renal disease: Secondary | ICD-10-CM | POA: Diagnosis not present

## 2021-06-14 DIAGNOSIS — N2581 Secondary hyperparathyroidism of renal origin: Secondary | ICD-10-CM | POA: Diagnosis not present

## 2021-06-14 DIAGNOSIS — E119 Type 2 diabetes mellitus without complications: Secondary | ICD-10-CM | POA: Diagnosis not present

## 2021-06-14 DIAGNOSIS — D631 Anemia in chronic kidney disease: Secondary | ICD-10-CM | POA: Diagnosis not present

## 2021-06-14 DIAGNOSIS — Z992 Dependence on renal dialysis: Secondary | ICD-10-CM | POA: Diagnosis not present

## 2021-06-14 DIAGNOSIS — D509 Iron deficiency anemia, unspecified: Secondary | ICD-10-CM | POA: Diagnosis not present

## 2021-06-16 DIAGNOSIS — E119 Type 2 diabetes mellitus without complications: Secondary | ICD-10-CM | POA: Diagnosis not present

## 2021-06-16 DIAGNOSIS — N186 End stage renal disease: Secondary | ICD-10-CM | POA: Diagnosis not present

## 2021-06-16 DIAGNOSIS — N2581 Secondary hyperparathyroidism of renal origin: Secondary | ICD-10-CM | POA: Diagnosis not present

## 2021-06-16 DIAGNOSIS — Z992 Dependence on renal dialysis: Secondary | ICD-10-CM | POA: Diagnosis not present

## 2021-06-16 DIAGNOSIS — D631 Anemia in chronic kidney disease: Secondary | ICD-10-CM | POA: Diagnosis not present

## 2021-06-16 DIAGNOSIS — D509 Iron deficiency anemia, unspecified: Secondary | ICD-10-CM | POA: Diagnosis not present

## 2021-06-19 DIAGNOSIS — N2581 Secondary hyperparathyroidism of renal origin: Secondary | ICD-10-CM | POA: Diagnosis not present

## 2021-06-19 DIAGNOSIS — E119 Type 2 diabetes mellitus without complications: Secondary | ICD-10-CM | POA: Diagnosis not present

## 2021-06-19 DIAGNOSIS — D631 Anemia in chronic kidney disease: Secondary | ICD-10-CM | POA: Diagnosis not present

## 2021-06-19 DIAGNOSIS — N186 End stage renal disease: Secondary | ICD-10-CM | POA: Diagnosis not present

## 2021-06-19 DIAGNOSIS — Z992 Dependence on renal dialysis: Secondary | ICD-10-CM | POA: Diagnosis not present

## 2021-06-19 DIAGNOSIS — D509 Iron deficiency anemia, unspecified: Secondary | ICD-10-CM | POA: Diagnosis not present

## 2021-06-21 DIAGNOSIS — D509 Iron deficiency anemia, unspecified: Secondary | ICD-10-CM | POA: Diagnosis not present

## 2021-06-21 DIAGNOSIS — D631 Anemia in chronic kidney disease: Secondary | ICD-10-CM | POA: Diagnosis not present

## 2021-06-21 DIAGNOSIS — N186 End stage renal disease: Secondary | ICD-10-CM | POA: Diagnosis not present

## 2021-06-21 DIAGNOSIS — E119 Type 2 diabetes mellitus without complications: Secondary | ICD-10-CM | POA: Diagnosis not present

## 2021-06-21 DIAGNOSIS — N2581 Secondary hyperparathyroidism of renal origin: Secondary | ICD-10-CM | POA: Diagnosis not present

## 2021-06-21 DIAGNOSIS — Z992 Dependence on renal dialysis: Secondary | ICD-10-CM | POA: Diagnosis not present

## 2021-06-23 DIAGNOSIS — N186 End stage renal disease: Secondary | ICD-10-CM | POA: Diagnosis not present

## 2021-06-23 DIAGNOSIS — N2581 Secondary hyperparathyroidism of renal origin: Secondary | ICD-10-CM | POA: Diagnosis not present

## 2021-06-23 DIAGNOSIS — E119 Type 2 diabetes mellitus without complications: Secondary | ICD-10-CM | POA: Diagnosis not present

## 2021-06-23 DIAGNOSIS — D509 Iron deficiency anemia, unspecified: Secondary | ICD-10-CM | POA: Diagnosis not present

## 2021-06-23 DIAGNOSIS — D631 Anemia in chronic kidney disease: Secondary | ICD-10-CM | POA: Diagnosis not present

## 2021-06-23 DIAGNOSIS — Z992 Dependence on renal dialysis: Secondary | ICD-10-CM | POA: Diagnosis not present

## 2021-06-26 DIAGNOSIS — E119 Type 2 diabetes mellitus without complications: Secondary | ICD-10-CM | POA: Diagnosis not present

## 2021-06-26 DIAGNOSIS — N2581 Secondary hyperparathyroidism of renal origin: Secondary | ICD-10-CM | POA: Diagnosis not present

## 2021-06-26 DIAGNOSIS — Z992 Dependence on renal dialysis: Secondary | ICD-10-CM | POA: Diagnosis not present

## 2021-06-26 DIAGNOSIS — D509 Iron deficiency anemia, unspecified: Secondary | ICD-10-CM | POA: Diagnosis not present

## 2021-06-26 DIAGNOSIS — D631 Anemia in chronic kidney disease: Secondary | ICD-10-CM | POA: Diagnosis not present

## 2021-06-26 DIAGNOSIS — N186 End stage renal disease: Secondary | ICD-10-CM | POA: Diagnosis not present

## 2021-06-28 DIAGNOSIS — D631 Anemia in chronic kidney disease: Secondary | ICD-10-CM | POA: Diagnosis not present

## 2021-06-28 DIAGNOSIS — E119 Type 2 diabetes mellitus without complications: Secondary | ICD-10-CM | POA: Diagnosis not present

## 2021-06-28 DIAGNOSIS — N2581 Secondary hyperparathyroidism of renal origin: Secondary | ICD-10-CM | POA: Diagnosis not present

## 2021-06-28 DIAGNOSIS — D509 Iron deficiency anemia, unspecified: Secondary | ICD-10-CM | POA: Diagnosis not present

## 2021-06-28 DIAGNOSIS — N186 End stage renal disease: Secondary | ICD-10-CM | POA: Diagnosis not present

## 2021-06-28 DIAGNOSIS — Z992 Dependence on renal dialysis: Secondary | ICD-10-CM | POA: Diagnosis not present

## 2021-06-29 ENCOUNTER — Ambulatory Visit (INDEPENDENT_AMBULATORY_CARE_PROVIDER_SITE_OTHER): Payer: Medicare Other | Admitting: Cardiology

## 2021-06-29 ENCOUNTER — Encounter (HOSPITAL_BASED_OUTPATIENT_CLINIC_OR_DEPARTMENT_OTHER): Payer: Self-pay | Admitting: Cardiology

## 2021-06-29 VITALS — BP 117/60 | Ht 71.0 in | Wt 172.0 lb

## 2021-06-29 DIAGNOSIS — E118 Type 2 diabetes mellitus with unspecified complications: Secondary | ICD-10-CM | POA: Diagnosis not present

## 2021-06-29 DIAGNOSIS — E782 Mixed hyperlipidemia: Secondary | ICD-10-CM

## 2021-06-29 DIAGNOSIS — Z992 Dependence on renal dialysis: Secondary | ICD-10-CM

## 2021-06-29 DIAGNOSIS — Z8673 Personal history of transient ischemic attack (TIA), and cerebral infarction without residual deficits: Secondary | ICD-10-CM

## 2021-06-29 DIAGNOSIS — N186 End stage renal disease: Secondary | ICD-10-CM

## 2021-06-29 NOTE — Patient Instructions (Signed)
Medication Instructions:  ?Your Physician recommend you continue on your current medication as directed.   ? ?*If you need a refill on your cardiac medications before your next appointment, please call your pharmacy* ? ? ?Lab Work: ?None ordered today ? ? ?Testing/Procedures: ?None ordered today ? ? ?Follow-Up: ?At [facility], you and your health needs are our priority.  As part of our continuing mission to provide you with exceptional heart care, we have created designated Provider Care Teams.  These Care Teams include your primary Cardiologist (physician) and Advanced Practice Providers (APPs -  Physician Assistants and Nurse Practitioners) who all work together to provide you with the care you need, when you need it. ? ?We recommend signing up for the patient portal called "MyChart".  Sign up information is provided on this After Visit Summary.  MyChart is used to connect with patients for Virtual Visits (Telemedicine).  Patients are able to view lab/test results, encounter notes, upcoming appointments, etc.  Non-urgent messages can be sent to your provider as well.   ?To learn more about what you can do with MyChart, go to [email or web address].   ? ?Your next appointment:   ?1 year(s) ? ?The format for your next appointment:   ?In Person ? ?Provider:   ?Bridgette Keston, MD{ ? ?

## 2021-06-29 NOTE — Progress Notes (Signed)
Virtual Visit via Telephone Note   This visit type was conducted due to national recommendations for restrictions regarding the COVID-19 Pandemic (e.g. social distancing) in an effort to limit this patient's exposure and mitigate transmission in our community.  Due to his co-morbid illnesses, this patient is at least at moderate risk for complications without adequate follow up.  This format is felt to be most appropriate for this patient at this time.  The patient did not have access to video technology/had technical difficulties with video requiring transitioning to audio format only (telephone).  All issues noted in this document were discussed and addressed.  No physical exam could be performed with this format.  Please refer to the patient's chart for his  consent to telehealth for [facility].   The patient was identified using 2 identifiers.  Patient Location: Home Provider Location: Office/Clinic   Date:  06/29/2021   ID:  Shawn [name], DOB 08-21-1960, MRN [id number]  PCP:  Vincente Liberty, MD  Cardiologist:  Buford Dresser, MD  Referring MD: Vincente Liberty, MD   CC: follow up  History of Present Illness:    Shawn [name] is a 60 y.o. male with a hx of end stage renal disease on dialysis, CVA, hypertension, diabetes, legal blindness.  Today, Has arm soreness, left shoulder to elbow. Has a fistula on that side (not in use), has a history of steal syndrome. No discoloration or loss of sensation in arm or hand. Is somewhat worse when he raises his arm over his head. Also has a history of fracturing clavicle on that side.  No recent syncope with dialysis. He titrates his midodrine based on his starting blood pressure. Also had his dry weight raised, which seems to be helping.  He denies any palpitations, chest pain, or shortness of breath. No lightheadedness, headaches, syncope, orthopnea, PND, lower extremity edema or exertional symptoms.     Past Medical History:  Diagnosis Date   Anemia    Arthritis    patient denies   Blind right eye    Chronic kidney disease    Dialysis since 1517 MWF   Complication of anesthesia    Diabetes mellitus    type II   Hypertension    Legally blind    No pertinent past medical history    PONV (postoperative nausea and vomiting)    N/V- became dehydrated had to have IV fluids- 01/2015   Shortness of breath dyspnea    when has too fluid   Stroke [facility]) 2012   date per patient   Syncope    Unspecified cerebral artery occlusion with cerebral infarction 04/02/2013   Past Surgical History:  Procedure Laterality Date   AV FISTULA PLACEMENT Right 05/12/2013   Procedure: INSERTION OF ARTERIOVENOUS (AV) GORE-TEX GRAFT ARM; ULTRASOUND GUIDED;  Surgeon: Conrad [place], MD;  Location: [place];  Service: Vascular;  Laterality: Right;   AV FISTULA PLACEMENT Left 01/18/2015   Procedure: INSERTION OF ARTERIOVENOUS GORE-TEX GRAFT LEFT UPPER ARM;  Surgeon: Conrad [place], MD;  Location: [place];  Service: Vascular;  Laterality: Left;   COLONOSCOPY  06/2012   EYE SURGERY Bilateral    retina surgery both eyes, cataract surgery both eyes   HERNIA REPAIR     right inguinal   INSERTION OF DIALYSIS CATHETER Right    left arm graft  10/2010   LIGATION ARTERIOVENOUS GORTEX GRAFT Left 05/03/2015   Procedure: LIGATION ARTERIOVENOUS GORTEX GRAFT-LEFT UPPER ARM;  Surgeon: Conrad Maxton, MD;  [facility]: [facility]  OR;  Service: Vascular;  Laterality: Left;     Current Meds  Medication Sig   ACETAMINOPHEN PO Take by mouth.   aspirin EC 325 MG tablet Take 325 mg by mouth at bedtime.   atorvastatin (LIPITOR) 40 MG tablet TAKE 1 TABLET EVERY DAY   b complex vitamins tablet Take 1 tablet by mouth at bedtime.   brimonidine (ALPHAGAN) 0.15 % ophthalmic solution    brimonidine (ALPHAGAN) 0.2 % ophthalmic solution 1 drop 2 (two) times daily.   calcitRIOL (ROCALTROL) 0.5 MCG capsule Take 1 capsule (0.5 mcg total) by mouth 3 (three)  times a week.   diphenhydramine-acetaminophen (TYLENOL PM) 25-500 MG TABS tablet Take 1 tablet by mouth at bedtime as needed (pain).   DROPLET PEN NEEDLES 31G X 6 MM MISC    erythromycin ophthalmic ointment Apply to eye as needed.   hydrOXYzine (ATARAX/VISTARIL) 10 MG tablet Take 10 mg by mouth daily as needed for itching.    latanoprost (XALATAN) 0.005 % ophthalmic solution Place 1 drop into both eyes at bedtime.    LEVEMIR FLEXTOUCH 100 UNIT/ML FlexPen Inject into the skin 2 (two) times daily.   Methoxy PEG-Epoetin Beta (MIRCERA IJ) Mircera   midodrine (PROAMATINE) 10 MG tablet Mon, Wed, Fri     Allergies:   Patient has no known allergies.   Social History   Tobacco Use   Smoking status: Never   Smokeless tobacco: Never  Vaping Use   Vaping Use: Never used  Substance Use Topics   Alcohol use: No    Alcohol/week: 0.0 standard drinks   Drug use: No     Family Hx: The patient's family history includes Alzheimer's disease in his mother; Cancer in his father and sister; Diabetes in his mother; Heart disease in his father; Stroke in his sister.  ROS:   Please see the history of present illness. All other systems are reviewed and negative.    Prior CV studies:   The following studies were reviewed today: Lexiscan MPI 01/22/18 There was no ST segment deviation noted during stress. The left ventricular ejection fraction is mildly decreased (45-54%). Nuclear stress EF: 51%, there is apicalseptal hypokinesis. This is a low risk study. With no significant ischemia or perfusion defects identified, however ejection fraction is mildly reduced with apical septal hypokinesis.   Monitor 8.8.19 14 day monitor reviewed. Average heart rate 63 bpm. No tachycardia. 25% of usable tracings were sinus bradycardia, 75% normal sinus rhythm. No pauses. 6 patient triggered events, including one for chest pain. All were sinus rhythm. No significant abnormalities noted on monitor.   Echo from 12/22/17  personally reviewed, agree with report: Left ventricle: The cavity size was normal. There was mild   concentric hypertrophy. Systolic function was mildly reduced. The   estimated ejection fraction was in the range of 45% to 50%. Mild   diffuse hypokinesis. Doppler parameters are consistent with   abnormal left ventricular relaxation (grade 1 diastolic   dysfunction). Doppler parameters are consistent with high   ventricular filling pressure. - Aortic valve: Transvalvular velocity was within the normal range.   There was no stenosis. There was no regurgitation. Valve area   (VTI): 2.17 cm^2. Valve area (Vmax): 2.16 cm^2. Valve area   (Vmean): 2.14 cm^2. - Mitral valve: Transvalvular velocity was within the normal range.   There was no evidence for stenosis. There was trivial   regurgitation. - Left atrium: The atrium was mildly dilated. - Right ventricle: The cavity size was normal. Wall thickness was  normal. Systolic function was normal. - Tricuspid valve: There was trivial regurgitation. - Pulmonary arteries: Systolic pressure was within the normal   range. PA peak pressure: 35 mm Hg (S).   VAS US CAROTID DUPLEX BILATERAL 04/05/2017: Summary:   - The vertebral arteries appear patent with antegrade flow.  - Findings consistent with 1- 39 percent stenosis involving the    right internal carotid artery and the left internal carotid    artery.    Labs/Other Tests and Data Reviewed:    EKG:  EKG is personally reviewed and interpreted.  04/20/20: Sinus Rhythm. Rate 66 bpm.  Recent Labs: No results found for requested labs within last 8760 hours.   Recent Lipid Panel Lab Results  Component Value Date/Time   CHOL (H) 01/26/2010 05:20 AM    207        ATP III CLASSIFICATION:  <200     mg/dL   Desirable  200-239  mg/dL   Borderline High  >=240    mg/dL   High          TRIG 115 01/26/2010 05:20 AM   HDL 39 (L) 01/26/2010 05:20 AM   CHOLHDL 5.3 01/26/2010 05:20 AM   LDLCALC  (H) 01/26/2010 05:20 AM    145        Total Cholesterol/HDL:CHD Risk Coronary Heart Disease Risk Table                     Men   Women  1/2 Average Risk   3.4   3.3  Average Risk       5.0   4.4  2 X Average Risk   9.6   7.1  3 X Average Risk  23.4   11.0        Use the calculated Patient Ratio above and the CHD Risk Table to determine the patient's CHD Risk.        ATP III CLASSIFICATION (LDL):  <100     mg/dL   Optimal  100-129  mg/dL   Near or Above                    Optimal  130-159  mg/dL   Borderline  160-189  mg/dL   High  >190     mg/dL   Very High    Wt Readings from Last 3 Encounters:  06/29/21 172 lb (78 kg)  06/14/20 177 lb (80.3 kg)  01/20/20 162 lb (73.5 kg)     Objective:    Vital Signs:  BP 117/60    Ht 5\' 11"  (1.803 m)    Wt 172 lb (78 kg)    BMI 23.99 kg/m    Speaking comfortably on the phone, no audible wheezing In no acute distress Alert and oriented Normal affect Normal speech   ASSESSMENT & PLAN:    History of CVA: no further events -discussed than from a CV standpoint, ok to drop to 81 mg aspirin. He takes the 325 mg dose about every other day and is comfortable continuing that regimen, -no active bleeding  ESRD on dialysis, complicated by hypertension and type II diabetes: -has had more hypotension on dialysis, now on midodrine. Off of all antihypertensives, sometimes still has elevated BP prior to start of dialysis session -management of ESRD/HTN by nephrology -diabetes management per PCP/endo  Hyperlipidemia:   -continue atorvastatin 40 mg -Ideally would like LDL <70, but there is unclear data for targets in ESRD. For now would continue/restart atorvastatin  40 mg.  Palpitations: infrequent, manageable -no high risk features   Cardiovascular risk factors and prevention -discussed lifestyle recommendations. Exercise ability limited given legal blindness. On renal diet.  Today, I have spent 12 minutes with the patient with  telehealth technology discussing the above problems.  Additional time spent in chart review, documentation, and communication.   1. ESRD (end stage renal disease) on dialysis ([place])   2. History of CVA (cerebrovascular accident)   3. Mixed hyperlipidemia   4. Diabetes mellitus with complication [facility])     Patient Instructions  Medication Instructions:  Your Physician recommend you continue on your current medication as directed.    *If you need a refill on your cardiac medications before your next appointment, please call your pharmacy*   Lab Work: None ordered today   Testing/Procedures: None ordered today   Follow-Up: At [facility], you and your health needs are our priority.  As part of our continuing mission to provide you with exceptional heart care, we have created designated Provider Care Teams.  These Care Teams include your primary Cardiologist (physician) and Advanced Practice Providers (APPs -  Physician Assistants and Nurse Practitioners) who all work together to provide you with the care you need, when you need it.  We recommend signing up for the patient portal called "MyChart".  Sign up information is provided on this After Visit Summary.  MyChart is used to connect with patients for Virtual Visits (Telemedicine).  Patients are able to view lab/test results, encounter notes, upcoming appointments, etc.  Non-urgent messages can be sent to your provider as well.   To learn more about what you can do with MyChart, go to [email or web address].    Your next appointment:   1 year(s)  The format for your next appointment:   In Person  Provider:   Buford Dresser, MD       Follow Up:   12 mos or sooner as needed   Signed, Buford Dresser, MD  06/29/2021 3:45 PM    [place]

## 2021-06-30 DIAGNOSIS — Z992 Dependence on renal dialysis: Secondary | ICD-10-CM | POA: Diagnosis not present

## 2021-06-30 DIAGNOSIS — N2581 Secondary hyperparathyroidism of renal origin: Secondary | ICD-10-CM | POA: Diagnosis not present

## 2021-06-30 DIAGNOSIS — D509 Iron deficiency anemia, unspecified: Secondary | ICD-10-CM | POA: Diagnosis not present

## 2021-06-30 DIAGNOSIS — E119 Type 2 diabetes mellitus without complications: Secondary | ICD-10-CM | POA: Diagnosis not present

## 2021-06-30 DIAGNOSIS — D631 Anemia in chronic kidney disease: Secondary | ICD-10-CM | POA: Diagnosis not present

## 2021-06-30 DIAGNOSIS — N186 End stage renal disease: Secondary | ICD-10-CM | POA: Diagnosis not present

## 2021-07-03 DIAGNOSIS — E119 Type 2 diabetes mellitus without complications: Secondary | ICD-10-CM | POA: Diagnosis not present

## 2021-07-03 DIAGNOSIS — D631 Anemia in chronic kidney disease: Secondary | ICD-10-CM | POA: Diagnosis not present

## 2021-07-03 DIAGNOSIS — N186 End stage renal disease: Secondary | ICD-10-CM | POA: Diagnosis not present

## 2021-07-03 DIAGNOSIS — D509 Iron deficiency anemia, unspecified: Secondary | ICD-10-CM | POA: Diagnosis not present

## 2021-07-03 DIAGNOSIS — N2581 Secondary hyperparathyroidism of renal origin: Secondary | ICD-10-CM | POA: Diagnosis not present

## 2021-07-03 DIAGNOSIS — Z992 Dependence on renal dialysis: Secondary | ICD-10-CM | POA: Diagnosis not present

## 2021-07-05 DIAGNOSIS — D509 Iron deficiency anemia, unspecified: Secondary | ICD-10-CM | POA: Diagnosis not present

## 2021-07-05 DIAGNOSIS — Z992 Dependence on renal dialysis: Secondary | ICD-10-CM | POA: Diagnosis not present

## 2021-07-05 DIAGNOSIS — N186 End stage renal disease: Secondary | ICD-10-CM | POA: Diagnosis not present

## 2021-07-05 DIAGNOSIS — E119 Type 2 diabetes mellitus without complications: Secondary | ICD-10-CM | POA: Diagnosis not present

## 2021-07-05 DIAGNOSIS — N2581 Secondary hyperparathyroidism of renal origin: Secondary | ICD-10-CM | POA: Diagnosis not present

## 2021-07-05 DIAGNOSIS — D631 Anemia in chronic kidney disease: Secondary | ICD-10-CM | POA: Diagnosis not present

## 2021-07-07 DIAGNOSIS — D509 Iron deficiency anemia, unspecified: Secondary | ICD-10-CM | POA: Diagnosis not present

## 2021-07-07 DIAGNOSIS — D631 Anemia in chronic kidney disease: Secondary | ICD-10-CM | POA: Diagnosis not present

## 2021-07-07 DIAGNOSIS — N186 End stage renal disease: Secondary | ICD-10-CM | POA: Diagnosis not present

## 2021-07-07 DIAGNOSIS — Z992 Dependence on renal dialysis: Secondary | ICD-10-CM | POA: Diagnosis not present

## 2021-07-07 DIAGNOSIS — N2581 Secondary hyperparathyroidism of renal origin: Secondary | ICD-10-CM | POA: Diagnosis not present

## 2021-07-07 DIAGNOSIS — E119 Type 2 diabetes mellitus without complications: Secondary | ICD-10-CM | POA: Diagnosis not present

## 2021-07-09 DIAGNOSIS — N186 End stage renal disease: Secondary | ICD-10-CM | POA: Diagnosis not present

## 2021-07-09 DIAGNOSIS — Z992 Dependence on renal dialysis: Secondary | ICD-10-CM | POA: Diagnosis not present

## 2021-07-09 DIAGNOSIS — I120 Hypertensive chronic kidney disease with stage 5 chronic kidney disease or end stage renal disease: Secondary | ICD-10-CM | POA: Diagnosis not present

## 2021-07-10 DIAGNOSIS — D509 Iron deficiency anemia, unspecified: Secondary | ICD-10-CM | POA: Diagnosis not present

## 2021-07-10 DIAGNOSIS — N2581 Secondary hyperparathyroidism of renal origin: Secondary | ICD-10-CM | POA: Diagnosis not present

## 2021-07-10 DIAGNOSIS — N186 End stage renal disease: Secondary | ICD-10-CM | POA: Diagnosis not present

## 2021-07-10 DIAGNOSIS — Z992 Dependence on renal dialysis: Secondary | ICD-10-CM | POA: Diagnosis not present

## 2021-07-10 DIAGNOSIS — D631 Anemia in chronic kidney disease: Secondary | ICD-10-CM | POA: Diagnosis not present

## 2021-07-12 DIAGNOSIS — N186 End stage renal disease: Secondary | ICD-10-CM | POA: Diagnosis not present

## 2021-07-12 DIAGNOSIS — N2581 Secondary hyperparathyroidism of renal origin: Secondary | ICD-10-CM | POA: Diagnosis not present

## 2021-07-12 DIAGNOSIS — D631 Anemia in chronic kidney disease: Secondary | ICD-10-CM | POA: Diagnosis not present

## 2021-07-12 DIAGNOSIS — Z992 Dependence on renal dialysis: Secondary | ICD-10-CM | POA: Diagnosis not present

## 2021-07-12 DIAGNOSIS — D509 Iron deficiency anemia, unspecified: Secondary | ICD-10-CM | POA: Diagnosis not present

## 2021-07-14 DIAGNOSIS — D631 Anemia in chronic kidney disease: Secondary | ICD-10-CM | POA: Diagnosis not present

## 2021-07-14 DIAGNOSIS — N186 End stage renal disease: Secondary | ICD-10-CM | POA: Diagnosis not present

## 2021-07-14 DIAGNOSIS — D509 Iron deficiency anemia, unspecified: Secondary | ICD-10-CM | POA: Diagnosis not present

## 2021-07-14 DIAGNOSIS — N2581 Secondary hyperparathyroidism of renal origin: Secondary | ICD-10-CM | POA: Diagnosis not present

## 2021-07-14 DIAGNOSIS — Z992 Dependence on renal dialysis: Secondary | ICD-10-CM | POA: Diagnosis not present

## 2021-07-17 DIAGNOSIS — Z992 Dependence on renal dialysis: Secondary | ICD-10-CM | POA: Diagnosis not present

## 2021-07-17 DIAGNOSIS — D509 Iron deficiency anemia, unspecified: Secondary | ICD-10-CM | POA: Diagnosis not present

## 2021-07-17 DIAGNOSIS — N186 End stage renal disease: Secondary | ICD-10-CM | POA: Diagnosis not present

## 2021-07-17 DIAGNOSIS — N2581 Secondary hyperparathyroidism of renal origin: Secondary | ICD-10-CM | POA: Diagnosis not present

## 2021-07-17 DIAGNOSIS — D631 Anemia in chronic kidney disease: Secondary | ICD-10-CM | POA: Diagnosis not present

## 2021-07-19 DIAGNOSIS — Z992 Dependence on renal dialysis: Secondary | ICD-10-CM | POA: Diagnosis not present

## 2021-07-19 DIAGNOSIS — D509 Iron deficiency anemia, unspecified: Secondary | ICD-10-CM | POA: Diagnosis not present

## 2021-07-19 DIAGNOSIS — N2581 Secondary hyperparathyroidism of renal origin: Secondary | ICD-10-CM | POA: Diagnosis not present

## 2021-07-19 DIAGNOSIS — D631 Anemia in chronic kidney disease: Secondary | ICD-10-CM | POA: Diagnosis not present

## 2021-07-19 DIAGNOSIS — N186 End stage renal disease: Secondary | ICD-10-CM | POA: Diagnosis not present

## 2021-07-21 DIAGNOSIS — D509 Iron deficiency anemia, unspecified: Secondary | ICD-10-CM | POA: Diagnosis not present

## 2021-07-21 DIAGNOSIS — Z992 Dependence on renal dialysis: Secondary | ICD-10-CM | POA: Diagnosis not present

## 2021-07-21 DIAGNOSIS — N186 End stage renal disease: Secondary | ICD-10-CM | POA: Diagnosis not present

## 2021-07-21 DIAGNOSIS — D631 Anemia in chronic kidney disease: Secondary | ICD-10-CM | POA: Diagnosis not present

## 2021-07-21 DIAGNOSIS — N2581 Secondary hyperparathyroidism of renal origin: Secondary | ICD-10-CM | POA: Diagnosis not present

## 2021-07-24 DIAGNOSIS — N2581 Secondary hyperparathyroidism of renal origin: Secondary | ICD-10-CM | POA: Diagnosis not present

## 2021-07-24 DIAGNOSIS — N186 End stage renal disease: Secondary | ICD-10-CM | POA: Diagnosis not present

## 2021-07-24 DIAGNOSIS — Z992 Dependence on renal dialysis: Secondary | ICD-10-CM | POA: Diagnosis not present

## 2021-07-24 DIAGNOSIS — D631 Anemia in chronic kidney disease: Secondary | ICD-10-CM | POA: Diagnosis not present

## 2021-07-24 DIAGNOSIS — D509 Iron deficiency anemia, unspecified: Secondary | ICD-10-CM | POA: Diagnosis not present

## 2021-07-26 DIAGNOSIS — D631 Anemia in chronic kidney disease: Secondary | ICD-10-CM | POA: Diagnosis not present

## 2021-07-26 DIAGNOSIS — D509 Iron deficiency anemia, unspecified: Secondary | ICD-10-CM | POA: Diagnosis not present

## 2021-07-26 DIAGNOSIS — N2581 Secondary hyperparathyroidism of renal origin: Secondary | ICD-10-CM | POA: Diagnosis not present

## 2021-07-26 DIAGNOSIS — E1129 Type 2 diabetes mellitus with other diabetic kidney complication: Secondary | ICD-10-CM | POA: Diagnosis not present

## 2021-07-26 DIAGNOSIS — N186 End stage renal disease: Secondary | ICD-10-CM | POA: Diagnosis not present

## 2021-07-26 DIAGNOSIS — Z992 Dependence on renal dialysis: Secondary | ICD-10-CM | POA: Diagnosis not present

## 2021-07-28 DIAGNOSIS — N2581 Secondary hyperparathyroidism of renal origin: Secondary | ICD-10-CM | POA: Diagnosis not present

## 2021-07-28 DIAGNOSIS — D509 Iron deficiency anemia, unspecified: Secondary | ICD-10-CM | POA: Diagnosis not present

## 2021-07-28 DIAGNOSIS — D631 Anemia in chronic kidney disease: Secondary | ICD-10-CM | POA: Diagnosis not present

## 2021-07-28 DIAGNOSIS — N186 End stage renal disease: Secondary | ICD-10-CM | POA: Diagnosis not present

## 2021-07-28 DIAGNOSIS — Z992 Dependence on renal dialysis: Secondary | ICD-10-CM | POA: Diagnosis not present

## 2021-07-31 DIAGNOSIS — D631 Anemia in chronic kidney disease: Secondary | ICD-10-CM | POA: Diagnosis not present

## 2021-07-31 DIAGNOSIS — D509 Iron deficiency anemia, unspecified: Secondary | ICD-10-CM | POA: Diagnosis not present

## 2021-07-31 DIAGNOSIS — Z992 Dependence on renal dialysis: Secondary | ICD-10-CM | POA: Diagnosis not present

## 2021-07-31 DIAGNOSIS — N186 End stage renal disease: Secondary | ICD-10-CM | POA: Diagnosis not present

## 2021-07-31 DIAGNOSIS — N2581 Secondary hyperparathyroidism of renal origin: Secondary | ICD-10-CM | POA: Diagnosis not present

## 2021-08-02 DIAGNOSIS — Z992 Dependence on renal dialysis: Secondary | ICD-10-CM | POA: Diagnosis not present

## 2021-08-02 DIAGNOSIS — D631 Anemia in chronic kidney disease: Secondary | ICD-10-CM | POA: Diagnosis not present

## 2021-08-02 DIAGNOSIS — N186 End stage renal disease: Secondary | ICD-10-CM | POA: Diagnosis not present

## 2021-08-02 DIAGNOSIS — N2581 Secondary hyperparathyroidism of renal origin: Secondary | ICD-10-CM | POA: Diagnosis not present

## 2021-08-02 DIAGNOSIS — D509 Iron deficiency anemia, unspecified: Secondary | ICD-10-CM | POA: Diagnosis not present

## 2021-08-04 DIAGNOSIS — D509 Iron deficiency anemia, unspecified: Secondary | ICD-10-CM | POA: Diagnosis not present

## 2021-08-04 DIAGNOSIS — N2581 Secondary hyperparathyroidism of renal origin: Secondary | ICD-10-CM | POA: Diagnosis not present

## 2021-08-04 DIAGNOSIS — Z992 Dependence on renal dialysis: Secondary | ICD-10-CM | POA: Diagnosis not present

## 2021-08-04 DIAGNOSIS — D631 Anemia in chronic kidney disease: Secondary | ICD-10-CM | POA: Diagnosis not present

## 2021-08-04 DIAGNOSIS — N186 End stage renal disease: Secondary | ICD-10-CM | POA: Diagnosis not present

## 2021-08-07 DIAGNOSIS — D509 Iron deficiency anemia, unspecified: Secondary | ICD-10-CM | POA: Diagnosis not present

## 2021-08-07 DIAGNOSIS — N2581 Secondary hyperparathyroidism of renal origin: Secondary | ICD-10-CM | POA: Diagnosis not present

## 2021-08-07 DIAGNOSIS — Z992 Dependence on renal dialysis: Secondary | ICD-10-CM | POA: Diagnosis not present

## 2021-08-07 DIAGNOSIS — D631 Anemia in chronic kidney disease: Secondary | ICD-10-CM | POA: Diagnosis not present

## 2021-08-07 DIAGNOSIS — N186 End stage renal disease: Secondary | ICD-10-CM | POA: Diagnosis not present

## 2021-08-09 DIAGNOSIS — I120 Hypertensive chronic kidney disease with stage 5 chronic kidney disease or end stage renal disease: Secondary | ICD-10-CM | POA: Diagnosis not present

## 2021-08-09 DIAGNOSIS — N186 End stage renal disease: Secondary | ICD-10-CM | POA: Diagnosis not present

## 2021-08-09 DIAGNOSIS — D631 Anemia in chronic kidney disease: Secondary | ICD-10-CM | POA: Diagnosis not present

## 2021-08-09 DIAGNOSIS — Z992 Dependence on renal dialysis: Secondary | ICD-10-CM | POA: Diagnosis not present

## 2021-08-09 DIAGNOSIS — N2581 Secondary hyperparathyroidism of renal origin: Secondary | ICD-10-CM | POA: Diagnosis not present

## 2021-08-11 DIAGNOSIS — N186 End stage renal disease: Secondary | ICD-10-CM | POA: Diagnosis not present

## 2021-08-11 DIAGNOSIS — N2581 Secondary hyperparathyroidism of renal origin: Secondary | ICD-10-CM | POA: Diagnosis not present

## 2021-08-11 DIAGNOSIS — Z992 Dependence on renal dialysis: Secondary | ICD-10-CM | POA: Diagnosis not present

## 2021-08-11 DIAGNOSIS — D631 Anemia in chronic kidney disease: Secondary | ICD-10-CM | POA: Diagnosis not present

## 2021-08-14 DIAGNOSIS — D631 Anemia in chronic kidney disease: Secondary | ICD-10-CM | POA: Diagnosis not present

## 2021-08-14 DIAGNOSIS — Z992 Dependence on renal dialysis: Secondary | ICD-10-CM | POA: Diagnosis not present

## 2021-08-14 DIAGNOSIS — N2581 Secondary hyperparathyroidism of renal origin: Secondary | ICD-10-CM | POA: Diagnosis not present

## 2021-08-14 DIAGNOSIS — N186 End stage renal disease: Secondary | ICD-10-CM | POA: Diagnosis not present

## 2021-08-15 ENCOUNTER — Ambulatory Visit (INDEPENDENT_AMBULATORY_CARE_PROVIDER_SITE_OTHER): Payer: Medicare Other | Admitting: Podiatry

## 2021-08-15 ENCOUNTER — Other Ambulatory Visit: Payer: Self-pay

## 2021-08-15 DIAGNOSIS — M79674 Pain in right toe(s): Secondary | ICD-10-CM

## 2021-08-15 DIAGNOSIS — M2042 Other hammer toe(s) (acquired), left foot: Secondary | ICD-10-CM | POA: Diagnosis not present

## 2021-08-15 DIAGNOSIS — M79675 Pain in left toe(s): Secondary | ICD-10-CM

## 2021-08-15 DIAGNOSIS — N186 End stage renal disease: Secondary | ICD-10-CM

## 2021-08-15 DIAGNOSIS — Z992 Dependence on renal dialysis: Secondary | ICD-10-CM | POA: Diagnosis not present

## 2021-08-15 DIAGNOSIS — B351 Tinea unguium: Secondary | ICD-10-CM | POA: Diagnosis not present

## 2021-08-15 DIAGNOSIS — M2041 Other hammer toe(s) (acquired), right foot: Secondary | ICD-10-CM | POA: Diagnosis not present

## 2021-08-15 DIAGNOSIS — E119 Type 2 diabetes mellitus without complications: Secondary | ICD-10-CM | POA: Diagnosis not present

## 2021-08-15 DIAGNOSIS — E1151 Type 2 diabetes mellitus with diabetic peripheral angiopathy without gangrene: Secondary | ICD-10-CM | POA: Diagnosis not present

## 2021-08-15 DIAGNOSIS — L600 Ingrowing nail: Secondary | ICD-10-CM | POA: Diagnosis not present

## 2021-08-16 DIAGNOSIS — N2581 Secondary hyperparathyroidism of renal origin: Secondary | ICD-10-CM | POA: Diagnosis not present

## 2021-08-16 DIAGNOSIS — N186 End stage renal disease: Secondary | ICD-10-CM | POA: Diagnosis not present

## 2021-08-16 DIAGNOSIS — D631 Anemia in chronic kidney disease: Secondary | ICD-10-CM | POA: Diagnosis not present

## 2021-08-16 DIAGNOSIS — Z992 Dependence on renal dialysis: Secondary | ICD-10-CM | POA: Diagnosis not present

## 2021-08-18 DIAGNOSIS — N2581 Secondary hyperparathyroidism of renal origin: Secondary | ICD-10-CM | POA: Diagnosis not present

## 2021-08-18 DIAGNOSIS — D631 Anemia in chronic kidney disease: Secondary | ICD-10-CM | POA: Diagnosis not present

## 2021-08-18 DIAGNOSIS — Z992 Dependence on renal dialysis: Secondary | ICD-10-CM | POA: Diagnosis not present

## 2021-08-18 DIAGNOSIS — N186 End stage renal disease: Secondary | ICD-10-CM | POA: Diagnosis not present

## 2021-08-20 ENCOUNTER — Encounter: Payer: Self-pay | Admitting: Podiatry

## 2021-08-20 NOTE — Progress Notes (Signed)
ANNUAL DIABETIC FOOT EXAM  Subjective: Shawn [name] presents today for annual diabetic foot examination, at risk foot care with h/o NIDDM with ESRD on hemodialysis, and painful elongated mycotic toenails 1-5 bilaterally which are tender when wearing enclosed shoe gear. Pain is relieved with periodic professional debridement..  Patient relates h/o diabetes.  Patient denies any h/o foot wounds.  Patient denies any numbness, tingling, burning, or pins/needle sensation in feet.  Patient did not check blood glucose this morning.  Risk factors: diabetes, PAD, hyperlipidemia, HTN.  Vincente Liberty, MD is patient's PCP. Last visit was 4 months ago.  Past Medical History:  Diagnosis Date   Anemia    Arthritis    patient denies   Blind right eye    Chronic kidney disease    Dialysis since 0867 MWF   Complication of anesthesia    Diabetes mellitus    type II   Hypertension    Legally blind    No pertinent past medical history    PONV (postoperative nausea and vomiting)    N/V- became dehydrated had to have IV fluids- 01/2015   Shortness of breath dyspnea    when has too fluid   Stroke ([place]) 2012   date per patient   Syncope    Unspecified cerebral artery occlusion with cerebral infarction 04/02/2013   Patient Active Problem List   Diagnosis Date Noted   Fluid overload, unspecified 12/15/2019   Skin lesion 06/25/2019   Breast lump 61/95/0932   Chronic systolic heart failure ([place]) 06/24/2019   Abnormal findings on diagnostic imaging of lung 06/15/2019   Shortness of breath 06/15/2019   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   Mild protein-calorie malnutrition ([place]) 06/10/2019   S/P thoracentesis    Acute respiratory failure with hypercapnia ([place]) 05/25/2019   Acute hypercapnic respiratory failure ([place]) 05/25/2019   History of CVA (cerebrovascular accident) 05/05/2019   Pure hypercholesterolemia 05/05/2019   Hypokalemia 04/17/2019   Pain due to onychomycosis of toenails of  both feet 12/23/2018   Heart palpitations 12/21/2017   Tingling of left upper extremity 12/21/2017   Cerebral infarction, unspecified ([place]) 05/23/2017   Syncope 01/23/2016   Hypertension 01/23/2016   Orthostatic hypotension 01/23/2016   Legally blind    Diabetes ([place])    Diabetes mellitus with complication ([place])    Complication of vascular dialysis catheter 12/14/2015   Dependence on renal dialysis ([place]) 05/03/2015   Intracranial atherosclerosis 08/10/2014   Other disorders of plasma-protein metabolism, not elsewhere classified 07/15/2014   Pain, unspecified 03/25/2014   Type 2 diabetes mellitus with diabetic peripheral angiopathy without gangrene ([place]) 03/25/2014   Encounter for screening for respiratory tuberculosis 10/02/2013   End stage renal disease ([place]) 06/12/2013   ESRD (end stage renal disease) on dialysis ([place]) 04/17/2013   Coagulation defect, unspecified ([place]) 04/14/2013   Cerebral artery occlusion with cerebral infarction ([place]) 04/02/2013   Dizziness and giddiness 04/02/2013   Anemia in chronic kidney disease 09/04/2011   Secondary hyperparathyroidism of renal origin ([place]) 09/04/2011   Hyperlipidemia, unspecified 02/16/2010   Hyperparathyroidism, unspecified ([place]) 02/16/2010   Iron deficiency anemia, unspecified 02/16/2010   Past Surgical History:  Procedure Laterality Date   AV FISTULA PLACEMENT Right 05/12/2013   Procedure: INSERTION OF ARTERIOVENOUS (AV) GORE-TEX GRAFT ARM; ULTRASOUND GUIDED;  Surgeon: Conrad Mermentau, MD;  Location: [facility];  Service: Vascular;  Laterality: Right;   AV FISTULA PLACEMENT Left 01/18/2015   Procedure: INSERTION OF ARTERIOVENOUS GORE-TEX GRAFT LEFT UPPER ARM;  Surgeon: Conrad [place], MD;  Location:  [facility];  Service: Vascular;  Laterality: Left;   COLONOSCOPY  06/2012   EYE SURGERY Bilateral    retina surgery both eyes, cataract surgery both eyes   HERNIA REPAIR     right inguinal   INSERTION OF DIALYSIS CATHETER Right    left arm graft   10/2010   LIGATION ARTERIOVENOUS GORTEX GRAFT Left 05/03/2015   Procedure: LIGATION ARTERIOVENOUS GORTEX GRAFT-LEFT UPPER ARM;  Surgeon: Conrad [place], MD;  Location: [facility];  Service: Vascular;  Laterality: Left;   Current Outpatient Medications on File Prior to Visit  Medication Sig Dispense Refill   aspirin EC 325 MG tablet Take 325 mg by mouth at bedtime.     atorvastatin (LIPITOR) 40 MG tablet TAKE 1 TABLET EVERY DAY 90 tablet 0   b complex vitamins tablet Take 1 tablet by mouth at bedtime.     brimonidine (ALPHAGAN) 0.15 % ophthalmic solution      brimonidine (ALPHAGAN) 0.2 % ophthalmic solution 1 drop 2 (two) times daily.     calcitRIOL (ROCALTROL) 0.5 MCG capsule Take 1 capsule (0.5 mcg total) by mouth 3 (three) times a week. 30 capsule 0   diphenhydramine-acetaminophen (TYLENOL PM) 25-500 MG TABS tablet Take 1 tablet by mouth at bedtime as needed (pain).     dorzolamide-timolol (COSOPT) 22.3-6.8 MG/ML ophthalmic solution Place 1 drop into both eyes 2 (two) times daily.  (Patient not taking: Reported on 06/29/2021)     Doxercalciferol (HECTOROL IV) Doxercalciferol (Hectorol)     DROPLET PEN NEEDLES 31G X 6 MM MISC      erythromycin ophthalmic ointment Apply to eye as needed.     hydrOXYzine (ATARAX/VISTARIL) 10 MG tablet Take 10 mg by mouth daily as needed for itching.      lanthanum (FOSRENOL) 1000 MG chewable tablet Chew 1,000 mg by mouth 3 (three) times daily with meals.     latanoprost (XALATAN) 0.005 % ophthalmic solution Place 1 drop into both eyes at bedtime.      LEVEMIR FLEXTOUCH 100 UNIT/ML FlexPen Inject into the skin 2 (two) times daily.     Methoxy PEG-Epoetin Beta (MIRCERA IJ) Mircera     midodrine (PROAMATINE) 10 MG tablet Mon, Wed, Fri     No current facility-administered medications on file prior to visit.    No Known Allergies Social History   Occupational History    Employer: UNEMPLOYED  Tobacco Use   Smoking status: Never   Smokeless tobacco: Never  Vaping  Use   Vaping Use: Never used  Substance and Sexual Activity   Alcohol use: No    Alcohol/week: 0.0 standard drinks   Drug use: No   Sexual activity: Not on file   Family History  Problem Relation Age of Onset   Diabetes Mother    Alzheimer's disease Mother    Cancer Father    Heart disease Father    Cancer Sister    Stroke Sister    Immunization History  Administered Date(s) Administered   Hepatitis B, adult 11/02/2016   Influenza,inj,Quad PF,6+ Mos 04/06/2019   Pneumococcal Conjugate-13 04/03/2013   Pneumococcal Polysaccharide-23 06/06/2016     Review of Systems: Negative except as noted in the HPI.   Objective: There were no vitals filed for this visit.  Shawn [name] is a pleasant 61 y.o. male in NAD. AAO X 3.  Vascular Examination: CFT <3 seconds b/l LE. Diminished pedal pulses b/l LE. Pedal hair absent. No pain with calf compression b/l. Lower extremity skin temperature gradient within normal  limits. No edema noted b/l LE. No ischemia or gangrene noted b/l LE. No cyanosis or clubbing noted b/l LE.  Dermatological Examination: Pedal skin thin, shiny and atrophic b/l LE. No open wounds b/l LE. No interdigital macerations noted b/l LE. Toenails 1-5 b/l elongated, discolored, dystrophic, thickened, crumbly with subungual debris and tenderness to dorsal palpation. Incurvated nailplate medial  border(s) L hallux.  Nail border hypertrophy present. There is tenderness to palpation. Sign(s) of infection: no clinical signs of infection noted on examination today..  Musculoskeletal Examination: Muscle strength 5/5 to all lower extremity muscle groups bilaterally. No pain, crepitus or joint limitation noted with ROM bilateral LE. Hammertoe(s) noted to the bilateral 2nd toes.  Footwear Assessment: Does the patient wear appropriate shoes? Yes. Does the patient need inserts/orthotics? No..  Neurological Examination: Protective sensation diminished with 10g monofilament b/l.  Vibratory sensation diminished b/l.  Assessment: 1. Pain due to onychomycosis of toenails of both feet   2. Ingrown toenail without infection   3. Acquired hammertoes of both feet   4. ESRD on dialysis ([place])   5. Type II diabetes mellitus with peripheral circulatory disorder (HCC)   6. Encounter for diabetic foot exam ([place])      ADA Risk Categorization: High Risk  Patient has one or more of the following: Loss of protective sensation Absent pedal pulses Severe Foot deformity History of foot ulcer  Plan: -Diabetic foot examination performed today. -Continue foot and shoe inspections daily. Monitor blood glucose per PCP/Endocrinologist's recommendations. -Mycotic toenails 1-5 bilaterally were debrided in length and girth with sterile nail nippers and dremel without incident. -Offending nail border debrided and curretaged L hallux utilizing sterile nail nipper and currette. Border(s) cleansed with alcohol and TAO applied. Patient instructed to apply triple antibiotic ointment  to L hallux once daily for 10-14 days. -Patient/POA to call should there be question/concern in the interim.  Return in about 9 weeks (around 10/17/2021).  Marzetta Board, DPM

## 2021-08-21 DIAGNOSIS — Z992 Dependence on renal dialysis: Secondary | ICD-10-CM | POA: Diagnosis not present

## 2021-08-21 DIAGNOSIS — N2581 Secondary hyperparathyroidism of renal origin: Secondary | ICD-10-CM | POA: Diagnosis not present

## 2021-08-21 DIAGNOSIS — D631 Anemia in chronic kidney disease: Secondary | ICD-10-CM | POA: Diagnosis not present

## 2021-08-21 DIAGNOSIS — N186 End stage renal disease: Secondary | ICD-10-CM | POA: Diagnosis not present

## 2021-08-23 DIAGNOSIS — Z992 Dependence on renal dialysis: Secondary | ICD-10-CM | POA: Diagnosis not present

## 2021-08-23 DIAGNOSIS — N186 End stage renal disease: Secondary | ICD-10-CM | POA: Diagnosis not present

## 2021-08-23 DIAGNOSIS — N2581 Secondary hyperparathyroidism of renal origin: Secondary | ICD-10-CM | POA: Diagnosis not present

## 2021-08-23 DIAGNOSIS — D631 Anemia in chronic kidney disease: Secondary | ICD-10-CM | POA: Diagnosis not present

## 2021-08-25 DIAGNOSIS — Z992 Dependence on renal dialysis: Secondary | ICD-10-CM | POA: Diagnosis not present

## 2021-08-25 DIAGNOSIS — D631 Anemia in chronic kidney disease: Secondary | ICD-10-CM | POA: Diagnosis not present

## 2021-08-25 DIAGNOSIS — N2581 Secondary hyperparathyroidism of renal origin: Secondary | ICD-10-CM | POA: Diagnosis not present

## 2021-08-25 DIAGNOSIS — N186 End stage renal disease: Secondary | ICD-10-CM | POA: Diagnosis not present

## 2021-08-28 ENCOUNTER — Other Ambulatory Visit: Payer: Self-pay | Admitting: Cardiology

## 2021-08-28 DIAGNOSIS — N186 End stage renal disease: Secondary | ICD-10-CM | POA: Diagnosis not present

## 2021-08-28 DIAGNOSIS — D631 Anemia in chronic kidney disease: Secondary | ICD-10-CM | POA: Diagnosis not present

## 2021-08-28 DIAGNOSIS — E782 Mixed hyperlipidemia: Secondary | ICD-10-CM

## 2021-08-28 DIAGNOSIS — Z992 Dependence on renal dialysis: Secondary | ICD-10-CM | POA: Diagnosis not present

## 2021-08-28 DIAGNOSIS — N2581 Secondary hyperparathyroidism of renal origin: Secondary | ICD-10-CM | POA: Diagnosis not present

## 2021-08-28 NOTE — Telephone Encounter (Signed)
Rx(s) sent to pharmacy electronically.  

## 2021-08-30 DIAGNOSIS — N186 End stage renal disease: Secondary | ICD-10-CM | POA: Diagnosis not present

## 2021-08-30 DIAGNOSIS — D631 Anemia in chronic kidney disease: Secondary | ICD-10-CM | POA: Diagnosis not present

## 2021-08-30 DIAGNOSIS — N2581 Secondary hyperparathyroidism of renal origin: Secondary | ICD-10-CM | POA: Diagnosis not present

## 2021-08-30 DIAGNOSIS — Z992 Dependence on renal dialysis: Secondary | ICD-10-CM | POA: Diagnosis not present

## 2021-09-01 DIAGNOSIS — N2581 Secondary hyperparathyroidism of renal origin: Secondary | ICD-10-CM | POA: Diagnosis not present

## 2021-09-01 DIAGNOSIS — N186 End stage renal disease: Secondary | ICD-10-CM | POA: Diagnosis not present

## 2021-09-01 DIAGNOSIS — D631 Anemia in chronic kidney disease: Secondary | ICD-10-CM | POA: Diagnosis not present

## 2021-09-01 DIAGNOSIS — Z992 Dependence on renal dialysis: Secondary | ICD-10-CM | POA: Diagnosis not present

## 2021-09-04 DIAGNOSIS — Z992 Dependence on renal dialysis: Secondary | ICD-10-CM | POA: Diagnosis not present

## 2021-09-04 DIAGNOSIS — D631 Anemia in chronic kidney disease: Secondary | ICD-10-CM | POA: Diagnosis not present

## 2021-09-04 DIAGNOSIS — N2581 Secondary hyperparathyroidism of renal origin: Secondary | ICD-10-CM | POA: Diagnosis not present

## 2021-09-04 DIAGNOSIS — N186 End stage renal disease: Secondary | ICD-10-CM | POA: Diagnosis not present

## 2021-09-06 DIAGNOSIS — D631 Anemia in chronic kidney disease: Secondary | ICD-10-CM | POA: Diagnosis not present

## 2021-09-06 DIAGNOSIS — Z992 Dependence on renal dialysis: Secondary | ICD-10-CM | POA: Diagnosis not present

## 2021-09-06 DIAGNOSIS — N2581 Secondary hyperparathyroidism of renal origin: Secondary | ICD-10-CM | POA: Diagnosis not present

## 2021-09-06 DIAGNOSIS — D509 Iron deficiency anemia, unspecified: Secondary | ICD-10-CM | POA: Diagnosis not present

## 2021-09-06 DIAGNOSIS — I120 Hypertensive chronic kidney disease with stage 5 chronic kidney disease or end stage renal disease: Secondary | ICD-10-CM | POA: Diagnosis not present

## 2021-09-06 DIAGNOSIS — N186 End stage renal disease: Secondary | ICD-10-CM | POA: Diagnosis not present

## 2021-09-08 DIAGNOSIS — N186 End stage renal disease: Secondary | ICD-10-CM | POA: Diagnosis not present

## 2021-09-08 DIAGNOSIS — Z992 Dependence on renal dialysis: Secondary | ICD-10-CM | POA: Diagnosis not present

## 2021-09-08 DIAGNOSIS — D509 Iron deficiency anemia, unspecified: Secondary | ICD-10-CM | POA: Diagnosis not present

## 2021-09-08 DIAGNOSIS — N2581 Secondary hyperparathyroidism of renal origin: Secondary | ICD-10-CM | POA: Diagnosis not present

## 2021-09-08 DIAGNOSIS — D631 Anemia in chronic kidney disease: Secondary | ICD-10-CM | POA: Diagnosis not present

## 2021-09-11 DIAGNOSIS — N186 End stage renal disease: Secondary | ICD-10-CM | POA: Diagnosis not present

## 2021-09-11 DIAGNOSIS — N2581 Secondary hyperparathyroidism of renal origin: Secondary | ICD-10-CM | POA: Diagnosis not present

## 2021-09-11 DIAGNOSIS — D509 Iron deficiency anemia, unspecified: Secondary | ICD-10-CM | POA: Diagnosis not present

## 2021-09-11 DIAGNOSIS — D631 Anemia in chronic kidney disease: Secondary | ICD-10-CM | POA: Diagnosis not present

## 2021-09-11 DIAGNOSIS — Z992 Dependence on renal dialysis: Secondary | ICD-10-CM | POA: Diagnosis not present

## 2021-09-13 DIAGNOSIS — D631 Anemia in chronic kidney disease: Secondary | ICD-10-CM | POA: Diagnosis not present

## 2021-09-13 DIAGNOSIS — N186 End stage renal disease: Secondary | ICD-10-CM | POA: Diagnosis not present

## 2021-09-13 DIAGNOSIS — N2581 Secondary hyperparathyroidism of renal origin: Secondary | ICD-10-CM | POA: Diagnosis not present

## 2021-09-13 DIAGNOSIS — Z992 Dependence on renal dialysis: Secondary | ICD-10-CM | POA: Diagnosis not present

## 2021-09-13 DIAGNOSIS — D509 Iron deficiency anemia, unspecified: Secondary | ICD-10-CM | POA: Diagnosis not present

## 2021-09-15 DIAGNOSIS — Z992 Dependence on renal dialysis: Secondary | ICD-10-CM | POA: Diagnosis not present

## 2021-09-15 DIAGNOSIS — D631 Anemia in chronic kidney disease: Secondary | ICD-10-CM | POA: Diagnosis not present

## 2021-09-15 DIAGNOSIS — N2581 Secondary hyperparathyroidism of renal origin: Secondary | ICD-10-CM | POA: Diagnosis not present

## 2021-09-15 DIAGNOSIS — D509 Iron deficiency anemia, unspecified: Secondary | ICD-10-CM | POA: Diagnosis not present

## 2021-09-15 DIAGNOSIS — N186 End stage renal disease: Secondary | ICD-10-CM | POA: Diagnosis not present

## 2021-09-18 DIAGNOSIS — N2581 Secondary hyperparathyroidism of renal origin: Secondary | ICD-10-CM | POA: Diagnosis not present

## 2021-09-18 DIAGNOSIS — Z992 Dependence on renal dialysis: Secondary | ICD-10-CM | POA: Diagnosis not present

## 2021-09-18 DIAGNOSIS — D631 Anemia in chronic kidney disease: Secondary | ICD-10-CM | POA: Diagnosis not present

## 2021-09-18 DIAGNOSIS — D509 Iron deficiency anemia, unspecified: Secondary | ICD-10-CM | POA: Diagnosis not present

## 2021-09-18 DIAGNOSIS — N186 End stage renal disease: Secondary | ICD-10-CM | POA: Diagnosis not present

## 2021-09-20 DIAGNOSIS — Z992 Dependence on renal dialysis: Secondary | ICD-10-CM | POA: Diagnosis not present

## 2021-09-20 DIAGNOSIS — D509 Iron deficiency anemia, unspecified: Secondary | ICD-10-CM | POA: Diagnosis not present

## 2021-09-20 DIAGNOSIS — N186 End stage renal disease: Secondary | ICD-10-CM | POA: Diagnosis not present

## 2021-09-20 DIAGNOSIS — D631 Anemia in chronic kidney disease: Secondary | ICD-10-CM | POA: Diagnosis not present

## 2021-09-20 DIAGNOSIS — N2581 Secondary hyperparathyroidism of renal origin: Secondary | ICD-10-CM | POA: Diagnosis not present

## 2021-09-22 DIAGNOSIS — D509 Iron deficiency anemia, unspecified: Secondary | ICD-10-CM | POA: Diagnosis not present

## 2021-09-22 DIAGNOSIS — N2581 Secondary hyperparathyroidism of renal origin: Secondary | ICD-10-CM | POA: Diagnosis not present

## 2021-09-22 DIAGNOSIS — D631 Anemia in chronic kidney disease: Secondary | ICD-10-CM | POA: Diagnosis not present

## 2021-09-22 DIAGNOSIS — N186 End stage renal disease: Secondary | ICD-10-CM | POA: Diagnosis not present

## 2021-09-22 DIAGNOSIS — Z992 Dependence on renal dialysis: Secondary | ICD-10-CM | POA: Diagnosis not present

## 2021-09-25 DIAGNOSIS — N2581 Secondary hyperparathyroidism of renal origin: Secondary | ICD-10-CM | POA: Diagnosis not present

## 2021-09-25 DIAGNOSIS — D631 Anemia in chronic kidney disease: Secondary | ICD-10-CM | POA: Diagnosis not present

## 2021-09-25 DIAGNOSIS — D509 Iron deficiency anemia, unspecified: Secondary | ICD-10-CM | POA: Diagnosis not present

## 2021-09-25 DIAGNOSIS — Z992 Dependence on renal dialysis: Secondary | ICD-10-CM | POA: Diagnosis not present

## 2021-09-25 DIAGNOSIS — N186 End stage renal disease: Secondary | ICD-10-CM | POA: Diagnosis not present

## 2021-09-26 DIAGNOSIS — I6990 Unspecified sequelae of unspecified cerebrovascular disease: Secondary | ICD-10-CM | POA: Diagnosis not present

## 2021-09-26 DIAGNOSIS — M7552 Bursitis of left shoulder: Secondary | ICD-10-CM | POA: Diagnosis not present

## 2021-09-26 DIAGNOSIS — I999 Unspecified disorder of circulatory system: Secondary | ICD-10-CM | POA: Diagnosis not present

## 2021-09-26 DIAGNOSIS — Z79899 Other long term (current) drug therapy: Secondary | ICD-10-CM | POA: Diagnosis not present

## 2021-09-26 DIAGNOSIS — H547 Unspecified visual loss: Secondary | ICD-10-CM | POA: Diagnosis not present

## 2021-09-26 DIAGNOSIS — I119 Hypertensive heart disease without heart failure: Secondary | ICD-10-CM | POA: Diagnosis not present

## 2021-09-26 DIAGNOSIS — R55 Syncope and collapse: Secondary | ICD-10-CM | POA: Diagnosis not present

## 2021-09-26 DIAGNOSIS — M2012 Hallux valgus (acquired), left foot: Secondary | ICD-10-CM | POA: Diagnosis not present

## 2021-09-26 DIAGNOSIS — E1165 Type 2 diabetes mellitus with hyperglycemia: Secondary | ICD-10-CM | POA: Diagnosis not present

## 2021-09-26 DIAGNOSIS — E7800 Pure hypercholesterolemia, unspecified: Secondary | ICD-10-CM | POA: Diagnosis not present

## 2021-09-26 DIAGNOSIS — E559 Vitamin D deficiency, unspecified: Secondary | ICD-10-CM | POA: Diagnosis not present

## 2021-09-27 DIAGNOSIS — N2581 Secondary hyperparathyroidism of renal origin: Secondary | ICD-10-CM | POA: Diagnosis not present

## 2021-09-27 DIAGNOSIS — N186 End stage renal disease: Secondary | ICD-10-CM | POA: Diagnosis not present

## 2021-09-27 DIAGNOSIS — Z992 Dependence on renal dialysis: Secondary | ICD-10-CM | POA: Diagnosis not present

## 2021-09-27 DIAGNOSIS — D509 Iron deficiency anemia, unspecified: Secondary | ICD-10-CM | POA: Diagnosis not present

## 2021-09-27 DIAGNOSIS — D631 Anemia in chronic kidney disease: Secondary | ICD-10-CM | POA: Diagnosis not present

## 2021-09-28 DIAGNOSIS — N186 End stage renal disease: Secondary | ICD-10-CM | POA: Diagnosis not present

## 2021-09-28 DIAGNOSIS — Z992 Dependence on renal dialysis: Secondary | ICD-10-CM | POA: Diagnosis not present

## 2021-09-28 DIAGNOSIS — D509 Iron deficiency anemia, unspecified: Secondary | ICD-10-CM | POA: Diagnosis not present

## 2021-09-28 DIAGNOSIS — N2581 Secondary hyperparathyroidism of renal origin: Secondary | ICD-10-CM | POA: Diagnosis not present

## 2021-09-28 DIAGNOSIS — D631 Anemia in chronic kidney disease: Secondary | ICD-10-CM | POA: Diagnosis not present

## 2021-10-02 DIAGNOSIS — D509 Iron deficiency anemia, unspecified: Secondary | ICD-10-CM | POA: Diagnosis not present

## 2021-10-02 DIAGNOSIS — N186 End stage renal disease: Secondary | ICD-10-CM | POA: Diagnosis not present

## 2021-10-02 DIAGNOSIS — N2581 Secondary hyperparathyroidism of renal origin: Secondary | ICD-10-CM | POA: Diagnosis not present

## 2021-10-02 DIAGNOSIS — D631 Anemia in chronic kidney disease: Secondary | ICD-10-CM | POA: Diagnosis not present

## 2021-10-02 DIAGNOSIS — Z992 Dependence on renal dialysis: Secondary | ICD-10-CM | POA: Diagnosis not present

## 2021-10-03 ENCOUNTER — Ambulatory Visit
Admission: RE | Admit: 2021-10-03 | Discharge: 2021-10-03 | Disposition: A | Discharge status: Home / Self Care | Payer: Medicare Other | Source: Ambulatory Visit | Attending: Pulmonary Disease | Admitting: Pulmonary Disease

## 2021-10-03 ENCOUNTER — Other Ambulatory Visit: Payer: Self-pay | Admitting: Pulmonary Disease

## 2021-10-03 DIAGNOSIS — M25512 Pain in left shoulder: Secondary | ICD-10-CM

## 2021-10-03 DIAGNOSIS — M1990 Unspecified osteoarthritis, unspecified site: Secondary | ICD-10-CM

## 2021-10-04 DIAGNOSIS — D509 Iron deficiency anemia, unspecified: Secondary | ICD-10-CM | POA: Diagnosis not present

## 2021-10-04 DIAGNOSIS — D631 Anemia in chronic kidney disease: Secondary | ICD-10-CM | POA: Diagnosis not present

## 2021-10-04 DIAGNOSIS — N2581 Secondary hyperparathyroidism of renal origin: Secondary | ICD-10-CM | POA: Diagnosis not present

## 2021-10-04 DIAGNOSIS — N186 End stage renal disease: Secondary | ICD-10-CM | POA: Diagnosis not present

## 2021-10-04 DIAGNOSIS — Z992 Dependence on renal dialysis: Secondary | ICD-10-CM | POA: Diagnosis not present

## 2021-10-06 DIAGNOSIS — Z992 Dependence on renal dialysis: Secondary | ICD-10-CM | POA: Diagnosis not present

## 2021-10-06 DIAGNOSIS — N186 End stage renal disease: Secondary | ICD-10-CM | POA: Diagnosis not present

## 2021-10-06 DIAGNOSIS — N2581 Secondary hyperparathyroidism of renal origin: Secondary | ICD-10-CM | POA: Diagnosis not present

## 2021-10-06 DIAGNOSIS — D509 Iron deficiency anemia, unspecified: Secondary | ICD-10-CM | POA: Diagnosis not present

## 2021-10-06 DIAGNOSIS — D631 Anemia in chronic kidney disease: Secondary | ICD-10-CM | POA: Diagnosis not present

## 2021-10-07 DIAGNOSIS — I120 Hypertensive chronic kidney disease with stage 5 chronic kidney disease or end stage renal disease: Secondary | ICD-10-CM | POA: Diagnosis not present

## 2021-10-07 DIAGNOSIS — Z992 Dependence on renal dialysis: Secondary | ICD-10-CM | POA: Diagnosis not present

## 2021-10-07 DIAGNOSIS — N186 End stage renal disease: Secondary | ICD-10-CM | POA: Diagnosis not present

## 2021-10-09 DIAGNOSIS — D631 Anemia in chronic kidney disease: Secondary | ICD-10-CM | POA: Diagnosis not present

## 2021-10-09 DIAGNOSIS — N186 End stage renal disease: Secondary | ICD-10-CM | POA: Diagnosis not present

## 2021-10-09 DIAGNOSIS — E119 Type 2 diabetes mellitus without complications: Secondary | ICD-10-CM | POA: Diagnosis not present

## 2021-10-09 DIAGNOSIS — N2581 Secondary hyperparathyroidism of renal origin: Secondary | ICD-10-CM | POA: Diagnosis not present

## 2021-10-09 DIAGNOSIS — D509 Iron deficiency anemia, unspecified: Secondary | ICD-10-CM | POA: Diagnosis not present

## 2021-10-09 DIAGNOSIS — Z992 Dependence on renal dialysis: Secondary | ICD-10-CM | POA: Diagnosis not present

## 2021-10-11 DIAGNOSIS — N2581 Secondary hyperparathyroidism of renal origin: Secondary | ICD-10-CM | POA: Diagnosis not present

## 2021-10-11 DIAGNOSIS — D631 Anemia in chronic kidney disease: Secondary | ICD-10-CM | POA: Diagnosis not present

## 2021-10-11 DIAGNOSIS — E119 Type 2 diabetes mellitus without complications: Secondary | ICD-10-CM | POA: Diagnosis not present

## 2021-10-11 DIAGNOSIS — D509 Iron deficiency anemia, unspecified: Secondary | ICD-10-CM | POA: Diagnosis not present

## 2021-10-11 DIAGNOSIS — N186 End stage renal disease: Secondary | ICD-10-CM | POA: Diagnosis not present

## 2021-10-11 DIAGNOSIS — Z992 Dependence on renal dialysis: Secondary | ICD-10-CM | POA: Diagnosis not present

## 2021-10-13 DIAGNOSIS — E119 Type 2 diabetes mellitus without complications: Secondary | ICD-10-CM | POA: Diagnosis not present

## 2021-10-13 DIAGNOSIS — D509 Iron deficiency anemia, unspecified: Secondary | ICD-10-CM | POA: Diagnosis not present

## 2021-10-13 DIAGNOSIS — D631 Anemia in chronic kidney disease: Secondary | ICD-10-CM | POA: Diagnosis not present

## 2021-10-13 DIAGNOSIS — N186 End stage renal disease: Secondary | ICD-10-CM | POA: Diagnosis not present

## 2021-10-13 DIAGNOSIS — Z992 Dependence on renal dialysis: Secondary | ICD-10-CM | POA: Diagnosis not present

## 2021-10-13 DIAGNOSIS — N2581 Secondary hyperparathyroidism of renal origin: Secondary | ICD-10-CM | POA: Diagnosis not present

## 2021-10-16 DIAGNOSIS — D631 Anemia in chronic kidney disease: Secondary | ICD-10-CM | POA: Diagnosis not present

## 2021-10-16 DIAGNOSIS — Z992 Dependence on renal dialysis: Secondary | ICD-10-CM | POA: Diagnosis not present

## 2021-10-16 DIAGNOSIS — D509 Iron deficiency anemia, unspecified: Secondary | ICD-10-CM | POA: Diagnosis not present

## 2021-10-16 DIAGNOSIS — E119 Type 2 diabetes mellitus without complications: Secondary | ICD-10-CM | POA: Diagnosis not present

## 2021-10-16 DIAGNOSIS — N186 End stage renal disease: Secondary | ICD-10-CM | POA: Diagnosis not present

## 2021-10-16 DIAGNOSIS — N2581 Secondary hyperparathyroidism of renal origin: Secondary | ICD-10-CM | POA: Diagnosis not present

## 2021-10-17 ENCOUNTER — Ambulatory Visit (INDEPENDENT_AMBULATORY_CARE_PROVIDER_SITE_OTHER): Payer: Medicare Other | Admitting: Podiatry

## 2021-10-17 ENCOUNTER — Encounter: Payer: Self-pay | Admitting: Podiatry

## 2021-10-17 DIAGNOSIS — E1151 Type 2 diabetes mellitus with diabetic peripheral angiopathy without gangrene: Secondary | ICD-10-CM

## 2021-10-17 DIAGNOSIS — B351 Tinea unguium: Secondary | ICD-10-CM | POA: Diagnosis not present

## 2021-10-17 DIAGNOSIS — Z992 Dependence on renal dialysis: Secondary | ICD-10-CM

## 2021-10-17 DIAGNOSIS — M79675 Pain in left toe(s): Secondary | ICD-10-CM

## 2021-10-17 DIAGNOSIS — M79674 Pain in right toe(s): Secondary | ICD-10-CM | POA: Diagnosis not present

## 2021-10-17 DIAGNOSIS — N186 End stage renal disease: Secondary | ICD-10-CM | POA: Diagnosis not present

## 2021-10-18 DIAGNOSIS — Z992 Dependence on renal dialysis: Secondary | ICD-10-CM | POA: Diagnosis not present

## 2021-10-18 DIAGNOSIS — N186 End stage renal disease: Secondary | ICD-10-CM | POA: Diagnosis not present

## 2021-10-18 DIAGNOSIS — D509 Iron deficiency anemia, unspecified: Secondary | ICD-10-CM | POA: Diagnosis not present

## 2021-10-18 DIAGNOSIS — D631 Anemia in chronic kidney disease: Secondary | ICD-10-CM | POA: Diagnosis not present

## 2021-10-18 DIAGNOSIS — E119 Type 2 diabetes mellitus without complications: Secondary | ICD-10-CM | POA: Diagnosis not present

## 2021-10-18 DIAGNOSIS — N2581 Secondary hyperparathyroidism of renal origin: Secondary | ICD-10-CM | POA: Diagnosis not present

## 2021-10-20 DIAGNOSIS — D631 Anemia in chronic kidney disease: Secondary | ICD-10-CM | POA: Diagnosis not present

## 2021-10-20 DIAGNOSIS — Z992 Dependence on renal dialysis: Secondary | ICD-10-CM | POA: Diagnosis not present

## 2021-10-20 DIAGNOSIS — N186 End stage renal disease: Secondary | ICD-10-CM | POA: Diagnosis not present

## 2021-10-20 DIAGNOSIS — E119 Type 2 diabetes mellitus without complications: Secondary | ICD-10-CM | POA: Diagnosis not present

## 2021-10-20 DIAGNOSIS — D509 Iron deficiency anemia, unspecified: Secondary | ICD-10-CM | POA: Diagnosis not present

## 2021-10-20 DIAGNOSIS — N2581 Secondary hyperparathyroidism of renal origin: Secondary | ICD-10-CM | POA: Diagnosis not present

## 2021-10-22 NOTE — Progress Notes (Signed)
?  Subjective:  ?Patient ID: Shawn [name], male    DOB: 05-Jul-1961,  MRN: [id number] ? ?Amos Micheals presents to clinic today for at risk foot care with h/o NIDDM with ESRD on hemodialysis and painful thick toenails that are difficult to trim. Pain interferes with ambulation. Aggravating factors include wearing enclosed shoe gear. Pain is relieved with periodic professional debridement. ? ?Patient did not check blood glucose today. ? ?New problem(s): None.  ? ?PCP is Vincente Liberty, MD , and last visit was June 06, 2021. ? ?No Known Allergies ? ?His wife is present during today's visit. ? ?Review of Systems: Negative except as noted in the HPI. ? ?Objective: No changes noted in today's physical examination. ?Casy Brunetto is a pleasant 61 y.o. male in NAD. AAO X 3. ? ?Vascular Examination: ?CFT <3 seconds b/l LE. Diminished pedal pulses b/l LE. Pedal hair absent. No pain with calf compression b/l. Lower extremity skin temperature gradient within normal limits. No edema noted b/l LE. No ischemia or gangrene noted b/l LE. No cyanosis or clubbing noted b/l LE. ? ?Dermatological Examination: ?Pedal skin thin, shiny and atrophic b/l LE. No open wounds b/l LE. No interdigital macerations noted b/l LE. Toenails 1-5 b/l elongated, discolored, dystrophic, thickened, crumbly with subungual debris and tenderness to dorsal palpation.  Subacute ingrown toenail noted right 2nd digit with dried hyperkeratosis medial border. No underlying abscess..  ? ?Musculoskeletal Examination: ?Muscle strength 5/5 to all lower extremity muscle groups bilaterally. No pain, crepitus or joint limitation noted with ROM bilateral LE. Hammertoe(s) noted to the bilateral 2nd toes. Patient ambulates with cane assistance. ? ?Neurological Examination: ?Protective sensation diminished with 10g monofilament b/l. Vibratory sensation diminished b/l. ? ?Assessment/Plan: ?1. Pain due to onychomycosis of toenails of both feet   ?2.  ESRD on dialysis [facility])   ?3. Type II diabetes mellitus with peripheral circulatory disorder (HCC)   ?  ?-Examined patient. ?-Mycotic toenails 1-5 bilaterally were debrided in length and girth with sterile nail nippers and dremel without incident. ?-Offending nail border debrided and curretaged R 2nd toe utilizing sterile nail nipper and currette. Border cleansed with alcohol and triple antibiotic applied. No further treatment required by patient/caregiver. ?-Patient/POA to call should there be question/concern in the interim.  ? ?Return in about 10 weeks (around 12/26/2021). ? ?Marzetta Board, DPM  ?

## 2021-10-23 DIAGNOSIS — D631 Anemia in chronic kidney disease: Secondary | ICD-10-CM | POA: Diagnosis not present

## 2021-10-23 DIAGNOSIS — Z992 Dependence on renal dialysis: Secondary | ICD-10-CM | POA: Diagnosis not present

## 2021-10-23 DIAGNOSIS — N2581 Secondary hyperparathyroidism of renal origin: Secondary | ICD-10-CM | POA: Diagnosis not present

## 2021-10-23 DIAGNOSIS — E119 Type 2 diabetes mellitus without complications: Secondary | ICD-10-CM | POA: Diagnosis not present

## 2021-10-23 DIAGNOSIS — N186 End stage renal disease: Secondary | ICD-10-CM | POA: Diagnosis not present

## 2021-10-23 DIAGNOSIS — D509 Iron deficiency anemia, unspecified: Secondary | ICD-10-CM | POA: Diagnosis not present

## 2021-10-25 DIAGNOSIS — D509 Iron deficiency anemia, unspecified: Secondary | ICD-10-CM | POA: Diagnosis not present

## 2021-10-25 DIAGNOSIS — D631 Anemia in chronic kidney disease: Secondary | ICD-10-CM | POA: Diagnosis not present

## 2021-10-25 DIAGNOSIS — N186 End stage renal disease: Secondary | ICD-10-CM | POA: Diagnosis not present

## 2021-10-25 DIAGNOSIS — Z992 Dependence on renal dialysis: Secondary | ICD-10-CM | POA: Diagnosis not present

## 2021-10-25 DIAGNOSIS — N2581 Secondary hyperparathyroidism of renal origin: Secondary | ICD-10-CM | POA: Diagnosis not present

## 2021-10-25 DIAGNOSIS — E1129 Type 2 diabetes mellitus with other diabetic kidney complication: Secondary | ICD-10-CM | POA: Diagnosis not present

## 2021-10-25 DIAGNOSIS — E119 Type 2 diabetes mellitus without complications: Secondary | ICD-10-CM | POA: Diagnosis not present

## 2021-10-27 DIAGNOSIS — D631 Anemia in chronic kidney disease: Secondary | ICD-10-CM | POA: Diagnosis not present

## 2021-10-27 DIAGNOSIS — D509 Iron deficiency anemia, unspecified: Secondary | ICD-10-CM | POA: Diagnosis not present

## 2021-10-27 DIAGNOSIS — N186 End stage renal disease: Secondary | ICD-10-CM | POA: Diagnosis not present

## 2021-10-27 DIAGNOSIS — N2581 Secondary hyperparathyroidism of renal origin: Secondary | ICD-10-CM | POA: Diagnosis not present

## 2021-10-27 DIAGNOSIS — Z992 Dependence on renal dialysis: Secondary | ICD-10-CM | POA: Diagnosis not present

## 2021-10-27 DIAGNOSIS — E119 Type 2 diabetes mellitus without complications: Secondary | ICD-10-CM | POA: Diagnosis not present

## 2021-10-30 DIAGNOSIS — N186 End stage renal disease: Secondary | ICD-10-CM | POA: Diagnosis not present

## 2021-10-30 DIAGNOSIS — Z992 Dependence on renal dialysis: Secondary | ICD-10-CM | POA: Diagnosis not present

## 2021-10-30 DIAGNOSIS — E119 Type 2 diabetes mellitus without complications: Secondary | ICD-10-CM | POA: Diagnosis not present

## 2021-10-30 DIAGNOSIS — N2581 Secondary hyperparathyroidism of renal origin: Secondary | ICD-10-CM | POA: Diagnosis not present

## 2021-10-30 DIAGNOSIS — D509 Iron deficiency anemia, unspecified: Secondary | ICD-10-CM | POA: Diagnosis not present

## 2021-10-30 DIAGNOSIS — D631 Anemia in chronic kidney disease: Secondary | ICD-10-CM | POA: Diagnosis not present

## 2021-11-01 DIAGNOSIS — D631 Anemia in chronic kidney disease: Secondary | ICD-10-CM | POA: Diagnosis not present

## 2021-11-01 DIAGNOSIS — E119 Type 2 diabetes mellitus without complications: Secondary | ICD-10-CM | POA: Diagnosis not present

## 2021-11-01 DIAGNOSIS — Z992 Dependence on renal dialysis: Secondary | ICD-10-CM | POA: Diagnosis not present

## 2021-11-01 DIAGNOSIS — N2581 Secondary hyperparathyroidism of renal origin: Secondary | ICD-10-CM | POA: Diagnosis not present

## 2021-11-01 DIAGNOSIS — D509 Iron deficiency anemia, unspecified: Secondary | ICD-10-CM | POA: Diagnosis not present

## 2021-11-01 DIAGNOSIS — N186 End stage renal disease: Secondary | ICD-10-CM | POA: Diagnosis not present

## 2021-11-03 DIAGNOSIS — D631 Anemia in chronic kidney disease: Secondary | ICD-10-CM | POA: Diagnosis not present

## 2021-11-03 DIAGNOSIS — Z992 Dependence on renal dialysis: Secondary | ICD-10-CM | POA: Diagnosis not present

## 2021-11-03 DIAGNOSIS — D509 Iron deficiency anemia, unspecified: Secondary | ICD-10-CM | POA: Diagnosis not present

## 2021-11-03 DIAGNOSIS — E119 Type 2 diabetes mellitus without complications: Secondary | ICD-10-CM | POA: Diagnosis not present

## 2021-11-03 DIAGNOSIS — N186 End stage renal disease: Secondary | ICD-10-CM | POA: Diagnosis not present

## 2021-11-03 DIAGNOSIS — N2581 Secondary hyperparathyroidism of renal origin: Secondary | ICD-10-CM | POA: Diagnosis not present

## 2021-11-06 DIAGNOSIS — D509 Iron deficiency anemia, unspecified: Secondary | ICD-10-CM | POA: Diagnosis not present

## 2021-11-06 DIAGNOSIS — I120 Hypertensive chronic kidney disease with stage 5 chronic kidney disease or end stage renal disease: Secondary | ICD-10-CM | POA: Diagnosis not present

## 2021-11-06 DIAGNOSIS — E119 Type 2 diabetes mellitus without complications: Secondary | ICD-10-CM | POA: Diagnosis not present

## 2021-11-06 DIAGNOSIS — N186 End stage renal disease: Secondary | ICD-10-CM | POA: Diagnosis not present

## 2021-11-06 DIAGNOSIS — N2581 Secondary hyperparathyroidism of renal origin: Secondary | ICD-10-CM | POA: Diagnosis not present

## 2021-11-06 DIAGNOSIS — D631 Anemia in chronic kidney disease: Secondary | ICD-10-CM | POA: Diagnosis not present

## 2021-11-06 DIAGNOSIS — Z992 Dependence on renal dialysis: Secondary | ICD-10-CM | POA: Diagnosis not present

## 2021-11-08 DIAGNOSIS — N186 End stage renal disease: Secondary | ICD-10-CM | POA: Diagnosis not present

## 2021-11-08 DIAGNOSIS — N2581 Secondary hyperparathyroidism of renal origin: Secondary | ICD-10-CM | POA: Diagnosis not present

## 2021-11-08 DIAGNOSIS — D509 Iron deficiency anemia, unspecified: Secondary | ICD-10-CM | POA: Diagnosis not present

## 2021-11-08 DIAGNOSIS — D631 Anemia in chronic kidney disease: Secondary | ICD-10-CM | POA: Diagnosis not present

## 2021-11-08 DIAGNOSIS — E119 Type 2 diabetes mellitus without complications: Secondary | ICD-10-CM | POA: Diagnosis not present

## 2021-11-08 DIAGNOSIS — Z992 Dependence on renal dialysis: Secondary | ICD-10-CM | POA: Diagnosis not present

## 2021-11-10 DIAGNOSIS — D509 Iron deficiency anemia, unspecified: Secondary | ICD-10-CM | POA: Diagnosis not present

## 2021-11-10 DIAGNOSIS — D631 Anemia in chronic kidney disease: Secondary | ICD-10-CM | POA: Diagnosis not present

## 2021-11-10 DIAGNOSIS — Z992 Dependence on renal dialysis: Secondary | ICD-10-CM | POA: Diagnosis not present

## 2021-11-10 DIAGNOSIS — N2581 Secondary hyperparathyroidism of renal origin: Secondary | ICD-10-CM | POA: Diagnosis not present

## 2021-11-10 DIAGNOSIS — E119 Type 2 diabetes mellitus without complications: Secondary | ICD-10-CM | POA: Diagnosis not present

## 2021-11-10 DIAGNOSIS — N186 End stage renal disease: Secondary | ICD-10-CM | POA: Diagnosis not present

## 2021-11-13 DIAGNOSIS — Z992 Dependence on renal dialysis: Secondary | ICD-10-CM | POA: Diagnosis not present

## 2021-11-13 DIAGNOSIS — D509 Iron deficiency anemia, unspecified: Secondary | ICD-10-CM | POA: Diagnosis not present

## 2021-11-13 DIAGNOSIS — D631 Anemia in chronic kidney disease: Secondary | ICD-10-CM | POA: Diagnosis not present

## 2021-11-13 DIAGNOSIS — E119 Type 2 diabetes mellitus without complications: Secondary | ICD-10-CM | POA: Diagnosis not present

## 2021-11-13 DIAGNOSIS — N186 End stage renal disease: Secondary | ICD-10-CM | POA: Diagnosis not present

## 2021-11-13 DIAGNOSIS — N2581 Secondary hyperparathyroidism of renal origin: Secondary | ICD-10-CM | POA: Diagnosis not present

## 2021-11-15 DIAGNOSIS — D631 Anemia in chronic kidney disease: Secondary | ICD-10-CM | POA: Diagnosis not present

## 2021-11-15 DIAGNOSIS — N186 End stage renal disease: Secondary | ICD-10-CM | POA: Diagnosis not present

## 2021-11-15 DIAGNOSIS — Z992 Dependence on renal dialysis: Secondary | ICD-10-CM | POA: Diagnosis not present

## 2021-11-15 DIAGNOSIS — E119 Type 2 diabetes mellitus without complications: Secondary | ICD-10-CM | POA: Diagnosis not present

## 2021-11-15 DIAGNOSIS — D509 Iron deficiency anemia, unspecified: Secondary | ICD-10-CM | POA: Diagnosis not present

## 2021-11-15 DIAGNOSIS — N2581 Secondary hyperparathyroidism of renal origin: Secondary | ICD-10-CM | POA: Diagnosis not present

## 2021-11-17 DIAGNOSIS — D509 Iron deficiency anemia, unspecified: Secondary | ICD-10-CM | POA: Diagnosis not present

## 2021-11-17 DIAGNOSIS — E119 Type 2 diabetes mellitus without complications: Secondary | ICD-10-CM | POA: Diagnosis not present

## 2021-11-17 DIAGNOSIS — D631 Anemia in chronic kidney disease: Secondary | ICD-10-CM | POA: Diagnosis not present

## 2021-11-17 DIAGNOSIS — Z992 Dependence on renal dialysis: Secondary | ICD-10-CM | POA: Diagnosis not present

## 2021-11-17 DIAGNOSIS — N186 End stage renal disease: Secondary | ICD-10-CM | POA: Diagnosis not present

## 2021-11-17 DIAGNOSIS — N2581 Secondary hyperparathyroidism of renal origin: Secondary | ICD-10-CM | POA: Diagnosis not present

## 2021-11-20 DIAGNOSIS — E119 Type 2 diabetes mellitus without complications: Secondary | ICD-10-CM | POA: Diagnosis not present

## 2021-11-20 DIAGNOSIS — N2581 Secondary hyperparathyroidism of renal origin: Secondary | ICD-10-CM | POA: Diagnosis not present

## 2021-11-20 DIAGNOSIS — D631 Anemia in chronic kidney disease: Secondary | ICD-10-CM | POA: Diagnosis not present

## 2021-11-20 DIAGNOSIS — Z992 Dependence on renal dialysis: Secondary | ICD-10-CM | POA: Diagnosis not present

## 2021-11-20 DIAGNOSIS — D509 Iron deficiency anemia, unspecified: Secondary | ICD-10-CM | POA: Diagnosis not present

## 2021-11-20 DIAGNOSIS — N186 End stage renal disease: Secondary | ICD-10-CM | POA: Diagnosis not present

## 2021-11-22 DIAGNOSIS — N186 End stage renal disease: Secondary | ICD-10-CM | POA: Diagnosis not present

## 2021-11-22 DIAGNOSIS — E119 Type 2 diabetes mellitus without complications: Secondary | ICD-10-CM | POA: Diagnosis not present

## 2021-11-22 DIAGNOSIS — N2581 Secondary hyperparathyroidism of renal origin: Secondary | ICD-10-CM | POA: Diagnosis not present

## 2021-11-22 DIAGNOSIS — Z992 Dependence on renal dialysis: Secondary | ICD-10-CM | POA: Diagnosis not present

## 2021-11-22 DIAGNOSIS — D631 Anemia in chronic kidney disease: Secondary | ICD-10-CM | POA: Diagnosis not present

## 2021-11-22 DIAGNOSIS — D509 Iron deficiency anemia, unspecified: Secondary | ICD-10-CM | POA: Diagnosis not present

## 2021-11-24 DIAGNOSIS — N2581 Secondary hyperparathyroidism of renal origin: Secondary | ICD-10-CM | POA: Diagnosis not present

## 2021-11-24 DIAGNOSIS — D631 Anemia in chronic kidney disease: Secondary | ICD-10-CM | POA: Diagnosis not present

## 2021-11-24 DIAGNOSIS — N186 End stage renal disease: Secondary | ICD-10-CM | POA: Diagnosis not present

## 2021-11-24 DIAGNOSIS — Z992 Dependence on renal dialysis: Secondary | ICD-10-CM | POA: Diagnosis not present

## 2021-11-24 DIAGNOSIS — D509 Iron deficiency anemia, unspecified: Secondary | ICD-10-CM | POA: Diagnosis not present

## 2021-11-24 DIAGNOSIS — E119 Type 2 diabetes mellitus without complications: Secondary | ICD-10-CM | POA: Diagnosis not present

## 2021-11-27 DIAGNOSIS — N186 End stage renal disease: Secondary | ICD-10-CM | POA: Diagnosis not present

## 2021-11-27 DIAGNOSIS — N2581 Secondary hyperparathyroidism of renal origin: Secondary | ICD-10-CM | POA: Diagnosis not present

## 2021-11-27 DIAGNOSIS — E119 Type 2 diabetes mellitus without complications: Secondary | ICD-10-CM | POA: Diagnosis not present

## 2021-11-27 DIAGNOSIS — D509 Iron deficiency anemia, unspecified: Secondary | ICD-10-CM | POA: Diagnosis not present

## 2021-11-27 DIAGNOSIS — D631 Anemia in chronic kidney disease: Secondary | ICD-10-CM | POA: Diagnosis not present

## 2021-11-27 DIAGNOSIS — Z992 Dependence on renal dialysis: Secondary | ICD-10-CM | POA: Diagnosis not present

## 2021-11-29 DIAGNOSIS — D509 Iron deficiency anemia, unspecified: Secondary | ICD-10-CM | POA: Diagnosis not present

## 2021-11-29 DIAGNOSIS — Z992 Dependence on renal dialysis: Secondary | ICD-10-CM | POA: Diagnosis not present

## 2021-11-29 DIAGNOSIS — N2581 Secondary hyperparathyroidism of renal origin: Secondary | ICD-10-CM | POA: Diagnosis not present

## 2021-11-29 DIAGNOSIS — E119 Type 2 diabetes mellitus without complications: Secondary | ICD-10-CM | POA: Diagnosis not present

## 2021-11-29 DIAGNOSIS — N186 End stage renal disease: Secondary | ICD-10-CM | POA: Diagnosis not present

## 2021-11-29 DIAGNOSIS — D631 Anemia in chronic kidney disease: Secondary | ICD-10-CM | POA: Diagnosis not present

## 2021-12-01 DIAGNOSIS — D631 Anemia in chronic kidney disease: Secondary | ICD-10-CM | POA: Diagnosis not present

## 2021-12-01 DIAGNOSIS — E119 Type 2 diabetes mellitus without complications: Secondary | ICD-10-CM | POA: Diagnosis not present

## 2021-12-01 DIAGNOSIS — Z992 Dependence on renal dialysis: Secondary | ICD-10-CM | POA: Diagnosis not present

## 2021-12-01 DIAGNOSIS — N2581 Secondary hyperparathyroidism of renal origin: Secondary | ICD-10-CM | POA: Diagnosis not present

## 2021-12-01 DIAGNOSIS — D509 Iron deficiency anemia, unspecified: Secondary | ICD-10-CM | POA: Diagnosis not present

## 2021-12-01 DIAGNOSIS — N186 End stage renal disease: Secondary | ICD-10-CM | POA: Diagnosis not present

## 2021-12-04 DIAGNOSIS — E119 Type 2 diabetes mellitus without complications: Secondary | ICD-10-CM | POA: Diagnosis not present

## 2021-12-04 DIAGNOSIS — N2581 Secondary hyperparathyroidism of renal origin: Secondary | ICD-10-CM | POA: Diagnosis not present

## 2021-12-04 DIAGNOSIS — Z992 Dependence on renal dialysis: Secondary | ICD-10-CM | POA: Diagnosis not present

## 2021-12-04 DIAGNOSIS — N186 End stage renal disease: Secondary | ICD-10-CM | POA: Diagnosis not present

## 2021-12-04 DIAGNOSIS — D631 Anemia in chronic kidney disease: Secondary | ICD-10-CM | POA: Diagnosis not present

## 2021-12-04 DIAGNOSIS — D509 Iron deficiency anemia, unspecified: Secondary | ICD-10-CM | POA: Diagnosis not present

## 2021-12-06 DIAGNOSIS — D509 Iron deficiency anemia, unspecified: Secondary | ICD-10-CM | POA: Diagnosis not present

## 2021-12-06 DIAGNOSIS — E119 Type 2 diabetes mellitus without complications: Secondary | ICD-10-CM | POA: Diagnosis not present

## 2021-12-06 DIAGNOSIS — N2581 Secondary hyperparathyroidism of renal origin: Secondary | ICD-10-CM | POA: Diagnosis not present

## 2021-12-06 DIAGNOSIS — D631 Anemia in chronic kidney disease: Secondary | ICD-10-CM | POA: Diagnosis not present

## 2021-12-06 DIAGNOSIS — Z992 Dependence on renal dialysis: Secondary | ICD-10-CM | POA: Diagnosis not present

## 2021-12-06 DIAGNOSIS — N186 End stage renal disease: Secondary | ICD-10-CM | POA: Diagnosis not present

## 2021-12-07 DIAGNOSIS — N186 End stage renal disease: Secondary | ICD-10-CM | POA: Diagnosis not present

## 2021-12-07 DIAGNOSIS — I120 Hypertensive chronic kidney disease with stage 5 chronic kidney disease or end stage renal disease: Secondary | ICD-10-CM | POA: Diagnosis not present

## 2021-12-07 DIAGNOSIS — Z992 Dependence on renal dialysis: Secondary | ICD-10-CM | POA: Diagnosis not present

## 2021-12-08 DIAGNOSIS — N186 End stage renal disease: Secondary | ICD-10-CM | POA: Diagnosis not present

## 2021-12-08 DIAGNOSIS — D631 Anemia in chronic kidney disease: Secondary | ICD-10-CM | POA: Diagnosis not present

## 2021-12-08 DIAGNOSIS — N2581 Secondary hyperparathyroidism of renal origin: Secondary | ICD-10-CM | POA: Diagnosis not present

## 2021-12-08 DIAGNOSIS — D509 Iron deficiency anemia, unspecified: Secondary | ICD-10-CM | POA: Diagnosis not present

## 2021-12-08 DIAGNOSIS — Z992 Dependence on renal dialysis: Secondary | ICD-10-CM | POA: Diagnosis not present

## 2021-12-08 DIAGNOSIS — E119 Type 2 diabetes mellitus without complications: Secondary | ICD-10-CM | POA: Diagnosis not present

## 2021-12-11 DIAGNOSIS — N2581 Secondary hyperparathyroidism of renal origin: Secondary | ICD-10-CM | POA: Diagnosis not present

## 2021-12-11 DIAGNOSIS — E119 Type 2 diabetes mellitus without complications: Secondary | ICD-10-CM | POA: Diagnosis not present

## 2021-12-11 DIAGNOSIS — D509 Iron deficiency anemia, unspecified: Secondary | ICD-10-CM | POA: Diagnosis not present

## 2021-12-11 DIAGNOSIS — D631 Anemia in chronic kidney disease: Secondary | ICD-10-CM | POA: Diagnosis not present

## 2021-12-11 DIAGNOSIS — N186 End stage renal disease: Secondary | ICD-10-CM | POA: Diagnosis not present

## 2021-12-11 DIAGNOSIS — Z992 Dependence on renal dialysis: Secondary | ICD-10-CM | POA: Diagnosis not present

## 2021-12-13 DIAGNOSIS — D631 Anemia in chronic kidney disease: Secondary | ICD-10-CM | POA: Diagnosis not present

## 2021-12-13 DIAGNOSIS — N2581 Secondary hyperparathyroidism of renal origin: Secondary | ICD-10-CM | POA: Diagnosis not present

## 2021-12-13 DIAGNOSIS — D509 Iron deficiency anemia, unspecified: Secondary | ICD-10-CM | POA: Diagnosis not present

## 2021-12-13 DIAGNOSIS — N186 End stage renal disease: Secondary | ICD-10-CM | POA: Diagnosis not present

## 2021-12-13 DIAGNOSIS — E119 Type 2 diabetes mellitus without complications: Secondary | ICD-10-CM | POA: Diagnosis not present

## 2021-12-13 DIAGNOSIS — Z992 Dependence on renal dialysis: Secondary | ICD-10-CM | POA: Diagnosis not present

## 2021-12-15 DIAGNOSIS — N186 End stage renal disease: Secondary | ICD-10-CM | POA: Diagnosis not present

## 2021-12-15 DIAGNOSIS — E119 Type 2 diabetes mellitus without complications: Secondary | ICD-10-CM | POA: Diagnosis not present

## 2021-12-15 DIAGNOSIS — D631 Anemia in chronic kidney disease: Secondary | ICD-10-CM | POA: Diagnosis not present

## 2021-12-15 DIAGNOSIS — N2581 Secondary hyperparathyroidism of renal origin: Secondary | ICD-10-CM | POA: Diagnosis not present

## 2021-12-15 DIAGNOSIS — Z992 Dependence on renal dialysis: Secondary | ICD-10-CM | POA: Diagnosis not present

## 2021-12-15 DIAGNOSIS — D509 Iron deficiency anemia, unspecified: Secondary | ICD-10-CM | POA: Diagnosis not present

## 2021-12-18 DIAGNOSIS — N186 End stage renal disease: Secondary | ICD-10-CM | POA: Diagnosis not present

## 2021-12-18 DIAGNOSIS — E119 Type 2 diabetes mellitus without complications: Secondary | ICD-10-CM | POA: Diagnosis not present

## 2021-12-18 DIAGNOSIS — D631 Anemia in chronic kidney disease: Secondary | ICD-10-CM | POA: Diagnosis not present

## 2021-12-18 DIAGNOSIS — D509 Iron deficiency anemia, unspecified: Secondary | ICD-10-CM | POA: Diagnosis not present

## 2021-12-18 DIAGNOSIS — N2581 Secondary hyperparathyroidism of renal origin: Secondary | ICD-10-CM | POA: Diagnosis not present

## 2021-12-18 DIAGNOSIS — Z992 Dependence on renal dialysis: Secondary | ICD-10-CM | POA: Diagnosis not present

## 2021-12-20 DIAGNOSIS — N186 End stage renal disease: Secondary | ICD-10-CM | POA: Diagnosis not present

## 2021-12-20 DIAGNOSIS — N2581 Secondary hyperparathyroidism of renal origin: Secondary | ICD-10-CM | POA: Diagnosis not present

## 2021-12-20 DIAGNOSIS — Z992 Dependence on renal dialysis: Secondary | ICD-10-CM | POA: Diagnosis not present

## 2021-12-20 DIAGNOSIS — E119 Type 2 diabetes mellitus without complications: Secondary | ICD-10-CM | POA: Diagnosis not present

## 2021-12-20 DIAGNOSIS — D509 Iron deficiency anemia, unspecified: Secondary | ICD-10-CM | POA: Diagnosis not present

## 2021-12-20 DIAGNOSIS — D631 Anemia in chronic kidney disease: Secondary | ICD-10-CM | POA: Diagnosis not present

## 2021-12-22 DIAGNOSIS — E119 Type 2 diabetes mellitus without complications: Secondary | ICD-10-CM | POA: Diagnosis not present

## 2021-12-22 DIAGNOSIS — Z992 Dependence on renal dialysis: Secondary | ICD-10-CM | POA: Diagnosis not present

## 2021-12-22 DIAGNOSIS — D631 Anemia in chronic kidney disease: Secondary | ICD-10-CM | POA: Diagnosis not present

## 2021-12-22 DIAGNOSIS — D509 Iron deficiency anemia, unspecified: Secondary | ICD-10-CM | POA: Diagnosis not present

## 2021-12-22 DIAGNOSIS — N2581 Secondary hyperparathyroidism of renal origin: Secondary | ICD-10-CM | POA: Diagnosis not present

## 2021-12-22 DIAGNOSIS — N186 End stage renal disease: Secondary | ICD-10-CM | POA: Diagnosis not present

## 2021-12-25 DIAGNOSIS — E119 Type 2 diabetes mellitus without complications: Secondary | ICD-10-CM | POA: Diagnosis not present

## 2021-12-25 DIAGNOSIS — Z992 Dependence on renal dialysis: Secondary | ICD-10-CM | POA: Diagnosis not present

## 2021-12-25 DIAGNOSIS — N2581 Secondary hyperparathyroidism of renal origin: Secondary | ICD-10-CM | POA: Diagnosis not present

## 2021-12-25 DIAGNOSIS — D631 Anemia in chronic kidney disease: Secondary | ICD-10-CM | POA: Diagnosis not present

## 2021-12-25 DIAGNOSIS — N186 End stage renal disease: Secondary | ICD-10-CM | POA: Diagnosis not present

## 2021-12-25 DIAGNOSIS — D509 Iron deficiency anemia, unspecified: Secondary | ICD-10-CM | POA: Diagnosis not present

## 2021-12-26 ENCOUNTER — Encounter: Payer: Self-pay | Admitting: Podiatry

## 2021-12-26 ENCOUNTER — Ambulatory Visit (INDEPENDENT_AMBULATORY_CARE_PROVIDER_SITE_OTHER): Payer: Medicare Other | Admitting: Podiatry

## 2021-12-26 DIAGNOSIS — L988 Other specified disorders of the skin and subcutaneous tissue: Secondary | ICD-10-CM | POA: Diagnosis not present

## 2021-12-26 DIAGNOSIS — M79674 Pain in right toe(s): Secondary | ICD-10-CM

## 2021-12-26 DIAGNOSIS — E1151 Type 2 diabetes mellitus with diabetic peripheral angiopathy without gangrene: Secondary | ICD-10-CM

## 2021-12-26 DIAGNOSIS — B351 Tinea unguium: Secondary | ICD-10-CM

## 2021-12-26 DIAGNOSIS — Z992 Dependence on renal dialysis: Secondary | ICD-10-CM

## 2021-12-26 DIAGNOSIS — M79675 Pain in left toe(s): Secondary | ICD-10-CM | POA: Diagnosis not present

## 2021-12-26 DIAGNOSIS — E11628 Type 2 diabetes mellitus with other skin complications: Secondary | ICD-10-CM

## 2021-12-26 DIAGNOSIS — N186 End stage renal disease: Secondary | ICD-10-CM | POA: Diagnosis not present

## 2021-12-27 DIAGNOSIS — N186 End stage renal disease: Secondary | ICD-10-CM | POA: Diagnosis not present

## 2021-12-27 DIAGNOSIS — D631 Anemia in chronic kidney disease: Secondary | ICD-10-CM | POA: Diagnosis not present

## 2021-12-27 DIAGNOSIS — D509 Iron deficiency anemia, unspecified: Secondary | ICD-10-CM | POA: Diagnosis not present

## 2021-12-27 DIAGNOSIS — E119 Type 2 diabetes mellitus without complications: Secondary | ICD-10-CM | POA: Diagnosis not present

## 2021-12-27 DIAGNOSIS — Z992 Dependence on renal dialysis: Secondary | ICD-10-CM | POA: Diagnosis not present

## 2021-12-27 DIAGNOSIS — N2581 Secondary hyperparathyroidism of renal origin: Secondary | ICD-10-CM | POA: Diagnosis not present

## 2021-12-29 DIAGNOSIS — E119 Type 2 diabetes mellitus without complications: Secondary | ICD-10-CM | POA: Diagnosis not present

## 2021-12-29 DIAGNOSIS — D631 Anemia in chronic kidney disease: Secondary | ICD-10-CM | POA: Diagnosis not present

## 2021-12-29 DIAGNOSIS — N186 End stage renal disease: Secondary | ICD-10-CM | POA: Diagnosis not present

## 2021-12-29 DIAGNOSIS — D509 Iron deficiency anemia, unspecified: Secondary | ICD-10-CM | POA: Diagnosis not present

## 2021-12-29 DIAGNOSIS — Z992 Dependence on renal dialysis: Secondary | ICD-10-CM | POA: Diagnosis not present

## 2021-12-29 DIAGNOSIS — N2581 Secondary hyperparathyroidism of renal origin: Secondary | ICD-10-CM | POA: Diagnosis not present

## 2022-01-01 DIAGNOSIS — N2581 Secondary hyperparathyroidism of renal origin: Secondary | ICD-10-CM | POA: Diagnosis not present

## 2022-01-01 DIAGNOSIS — E119 Type 2 diabetes mellitus without complications: Secondary | ICD-10-CM | POA: Diagnosis not present

## 2022-01-01 DIAGNOSIS — Z992 Dependence on renal dialysis: Secondary | ICD-10-CM | POA: Diagnosis not present

## 2022-01-01 DIAGNOSIS — D509 Iron deficiency anemia, unspecified: Secondary | ICD-10-CM | POA: Diagnosis not present

## 2022-01-01 DIAGNOSIS — N186 End stage renal disease: Secondary | ICD-10-CM | POA: Diagnosis not present

## 2022-01-01 DIAGNOSIS — D631 Anemia in chronic kidney disease: Secondary | ICD-10-CM | POA: Diagnosis not present

## 2022-01-03 DIAGNOSIS — N186 End stage renal disease: Secondary | ICD-10-CM | POA: Diagnosis not present

## 2022-01-03 DIAGNOSIS — D509 Iron deficiency anemia, unspecified: Secondary | ICD-10-CM | POA: Diagnosis not present

## 2022-01-03 DIAGNOSIS — Z992 Dependence on renal dialysis: Secondary | ICD-10-CM | POA: Diagnosis not present

## 2022-01-03 DIAGNOSIS — N19 Unspecified kidney failure: Secondary | ICD-10-CM | POA: Insufficient documentation

## 2022-01-03 DIAGNOSIS — N2581 Secondary hyperparathyroidism of renal origin: Secondary | ICD-10-CM | POA: Diagnosis not present

## 2022-01-03 DIAGNOSIS — D631 Anemia in chronic kidney disease: Secondary | ICD-10-CM | POA: Diagnosis not present

## 2022-01-03 DIAGNOSIS — E119 Type 2 diabetes mellitus without complications: Secondary | ICD-10-CM | POA: Diagnosis not present

## 2022-01-05 DIAGNOSIS — D631 Anemia in chronic kidney disease: Secondary | ICD-10-CM | POA: Diagnosis not present

## 2022-01-05 DIAGNOSIS — N186 End stage renal disease: Secondary | ICD-10-CM | POA: Diagnosis not present

## 2022-01-05 DIAGNOSIS — Z992 Dependence on renal dialysis: Secondary | ICD-10-CM | POA: Diagnosis not present

## 2022-01-05 DIAGNOSIS — D509 Iron deficiency anemia, unspecified: Secondary | ICD-10-CM | POA: Diagnosis not present

## 2022-01-05 DIAGNOSIS — N2581 Secondary hyperparathyroidism of renal origin: Secondary | ICD-10-CM | POA: Diagnosis not present

## 2022-01-05 DIAGNOSIS — E119 Type 2 diabetes mellitus without complications: Secondary | ICD-10-CM | POA: Diagnosis not present

## 2022-01-06 DIAGNOSIS — N186 End stage renal disease: Secondary | ICD-10-CM | POA: Diagnosis not present

## 2022-01-06 DIAGNOSIS — Z992 Dependence on renal dialysis: Secondary | ICD-10-CM | POA: Diagnosis not present

## 2022-01-06 DIAGNOSIS — I120 Hypertensive chronic kidney disease with stage 5 chronic kidney disease or end stage renal disease: Secondary | ICD-10-CM | POA: Diagnosis not present

## 2022-01-08 DIAGNOSIS — D509 Iron deficiency anemia, unspecified: Secondary | ICD-10-CM | POA: Diagnosis not present

## 2022-01-08 DIAGNOSIS — N186 End stage renal disease: Secondary | ICD-10-CM | POA: Diagnosis not present

## 2022-01-08 DIAGNOSIS — E119 Type 2 diabetes mellitus without complications: Secondary | ICD-10-CM | POA: Diagnosis not present

## 2022-01-08 DIAGNOSIS — N2581 Secondary hyperparathyroidism of renal origin: Secondary | ICD-10-CM | POA: Diagnosis not present

## 2022-01-08 DIAGNOSIS — Z992 Dependence on renal dialysis: Secondary | ICD-10-CM | POA: Diagnosis not present

## 2022-01-08 DIAGNOSIS — D631 Anemia in chronic kidney disease: Secondary | ICD-10-CM | POA: Diagnosis not present

## 2022-01-10 DIAGNOSIS — D509 Iron deficiency anemia, unspecified: Secondary | ICD-10-CM | POA: Diagnosis not present

## 2022-01-10 DIAGNOSIS — E119 Type 2 diabetes mellitus without complications: Secondary | ICD-10-CM | POA: Diagnosis not present

## 2022-01-10 DIAGNOSIS — N186 End stage renal disease: Secondary | ICD-10-CM | POA: Diagnosis not present

## 2022-01-10 DIAGNOSIS — D631 Anemia in chronic kidney disease: Secondary | ICD-10-CM | POA: Diagnosis not present

## 2022-01-10 DIAGNOSIS — N2581 Secondary hyperparathyroidism of renal origin: Secondary | ICD-10-CM | POA: Diagnosis not present

## 2022-01-10 DIAGNOSIS — Z992 Dependence on renal dialysis: Secondary | ICD-10-CM | POA: Diagnosis not present

## 2022-01-15 DIAGNOSIS — N2581 Secondary hyperparathyroidism of renal origin: Secondary | ICD-10-CM | POA: Diagnosis not present

## 2022-01-15 DIAGNOSIS — N186 End stage renal disease: Secondary | ICD-10-CM | POA: Diagnosis not present

## 2022-01-15 DIAGNOSIS — Z992 Dependence on renal dialysis: Secondary | ICD-10-CM | POA: Diagnosis not present

## 2022-01-15 DIAGNOSIS — D631 Anemia in chronic kidney disease: Secondary | ICD-10-CM | POA: Diagnosis not present

## 2022-01-15 DIAGNOSIS — D509 Iron deficiency anemia, unspecified: Secondary | ICD-10-CM | POA: Diagnosis not present

## 2022-01-15 DIAGNOSIS — E119 Type 2 diabetes mellitus without complications: Secondary | ICD-10-CM | POA: Diagnosis not present

## 2022-01-17 DIAGNOSIS — D631 Anemia in chronic kidney disease: Secondary | ICD-10-CM | POA: Diagnosis not present

## 2022-01-17 DIAGNOSIS — E119 Type 2 diabetes mellitus without complications: Secondary | ICD-10-CM | POA: Diagnosis not present

## 2022-01-17 DIAGNOSIS — N186 End stage renal disease: Secondary | ICD-10-CM | POA: Diagnosis not present

## 2022-01-17 DIAGNOSIS — N2581 Secondary hyperparathyroidism of renal origin: Secondary | ICD-10-CM | POA: Diagnosis not present

## 2022-01-17 DIAGNOSIS — Z992 Dependence on renal dialysis: Secondary | ICD-10-CM | POA: Diagnosis not present

## 2022-01-17 DIAGNOSIS — D509 Iron deficiency anemia, unspecified: Secondary | ICD-10-CM | POA: Diagnosis not present

## 2022-01-19 DIAGNOSIS — Z992 Dependence on renal dialysis: Secondary | ICD-10-CM | POA: Diagnosis not present

## 2022-01-19 DIAGNOSIS — N2581 Secondary hyperparathyroidism of renal origin: Secondary | ICD-10-CM | POA: Diagnosis not present

## 2022-01-19 DIAGNOSIS — N186 End stage renal disease: Secondary | ICD-10-CM | POA: Diagnosis not present

## 2022-01-19 DIAGNOSIS — D509 Iron deficiency anemia, unspecified: Secondary | ICD-10-CM | POA: Diagnosis not present

## 2022-01-19 DIAGNOSIS — E119 Type 2 diabetes mellitus without complications: Secondary | ICD-10-CM | POA: Diagnosis not present

## 2022-01-19 DIAGNOSIS — D631 Anemia in chronic kidney disease: Secondary | ICD-10-CM | POA: Diagnosis not present

## 2022-01-22 DIAGNOSIS — D631 Anemia in chronic kidney disease: Secondary | ICD-10-CM | POA: Diagnosis not present

## 2022-01-22 DIAGNOSIS — N2581 Secondary hyperparathyroidism of renal origin: Secondary | ICD-10-CM | POA: Diagnosis not present

## 2022-01-22 DIAGNOSIS — D509 Iron deficiency anemia, unspecified: Secondary | ICD-10-CM | POA: Diagnosis not present

## 2022-01-22 DIAGNOSIS — E119 Type 2 diabetes mellitus without complications: Secondary | ICD-10-CM | POA: Diagnosis not present

## 2022-01-22 DIAGNOSIS — Z992 Dependence on renal dialysis: Secondary | ICD-10-CM | POA: Diagnosis not present

## 2022-01-22 DIAGNOSIS — N186 End stage renal disease: Secondary | ICD-10-CM | POA: Diagnosis not present

## 2022-01-24 DIAGNOSIS — E119 Type 2 diabetes mellitus without complications: Secondary | ICD-10-CM | POA: Diagnosis not present

## 2022-01-24 DIAGNOSIS — Z992 Dependence on renal dialysis: Secondary | ICD-10-CM | POA: Diagnosis not present

## 2022-01-24 DIAGNOSIS — N186 End stage renal disease: Secondary | ICD-10-CM | POA: Diagnosis not present

## 2022-01-24 DIAGNOSIS — D509 Iron deficiency anemia, unspecified: Secondary | ICD-10-CM | POA: Diagnosis not present

## 2022-01-24 DIAGNOSIS — E1129 Type 2 diabetes mellitus with other diabetic kidney complication: Secondary | ICD-10-CM | POA: Diagnosis not present

## 2022-01-24 DIAGNOSIS — N2581 Secondary hyperparathyroidism of renal origin: Secondary | ICD-10-CM | POA: Diagnosis not present

## 2022-01-24 DIAGNOSIS — D631 Anemia in chronic kidney disease: Secondary | ICD-10-CM | POA: Diagnosis not present

## 2022-01-26 DIAGNOSIS — E119 Type 2 diabetes mellitus without complications: Secondary | ICD-10-CM | POA: Diagnosis not present

## 2022-01-26 DIAGNOSIS — D509 Iron deficiency anemia, unspecified: Secondary | ICD-10-CM | POA: Diagnosis not present

## 2022-01-26 DIAGNOSIS — Z992 Dependence on renal dialysis: Secondary | ICD-10-CM | POA: Diagnosis not present

## 2022-01-26 DIAGNOSIS — N2581 Secondary hyperparathyroidism of renal origin: Secondary | ICD-10-CM | POA: Diagnosis not present

## 2022-01-26 DIAGNOSIS — D631 Anemia in chronic kidney disease: Secondary | ICD-10-CM | POA: Diagnosis not present

## 2022-01-26 DIAGNOSIS — N186 End stage renal disease: Secondary | ICD-10-CM | POA: Diagnosis not present

## 2022-01-29 DIAGNOSIS — D631 Anemia in chronic kidney disease: Secondary | ICD-10-CM | POA: Diagnosis not present

## 2022-01-29 DIAGNOSIS — Z992 Dependence on renal dialysis: Secondary | ICD-10-CM | POA: Diagnosis not present

## 2022-01-29 DIAGNOSIS — N2581 Secondary hyperparathyroidism of renal origin: Secondary | ICD-10-CM | POA: Diagnosis not present

## 2022-01-29 DIAGNOSIS — N186 End stage renal disease: Secondary | ICD-10-CM | POA: Diagnosis not present

## 2022-01-29 DIAGNOSIS — E119 Type 2 diabetes mellitus without complications: Secondary | ICD-10-CM | POA: Diagnosis not present

## 2022-01-29 DIAGNOSIS — D509 Iron deficiency anemia, unspecified: Secondary | ICD-10-CM | POA: Diagnosis not present

## 2022-01-31 DIAGNOSIS — N2581 Secondary hyperparathyroidism of renal origin: Secondary | ICD-10-CM | POA: Diagnosis not present

## 2022-01-31 DIAGNOSIS — Z992 Dependence on renal dialysis: Secondary | ICD-10-CM | POA: Diagnosis not present

## 2022-01-31 DIAGNOSIS — N186 End stage renal disease: Secondary | ICD-10-CM | POA: Diagnosis not present

## 2022-01-31 DIAGNOSIS — E119 Type 2 diabetes mellitus without complications: Secondary | ICD-10-CM | POA: Diagnosis not present

## 2022-01-31 DIAGNOSIS — D509 Iron deficiency anemia, unspecified: Secondary | ICD-10-CM | POA: Diagnosis not present

## 2022-01-31 DIAGNOSIS — D631 Anemia in chronic kidney disease: Secondary | ICD-10-CM | POA: Diagnosis not present

## 2022-02-02 DIAGNOSIS — D631 Anemia in chronic kidney disease: Secondary | ICD-10-CM | POA: Diagnosis not present

## 2022-02-02 DIAGNOSIS — N2581 Secondary hyperparathyroidism of renal origin: Secondary | ICD-10-CM | POA: Diagnosis not present

## 2022-02-02 DIAGNOSIS — N186 End stage renal disease: Secondary | ICD-10-CM | POA: Diagnosis not present

## 2022-02-02 DIAGNOSIS — Z992 Dependence on renal dialysis: Secondary | ICD-10-CM | POA: Diagnosis not present

## 2022-02-02 DIAGNOSIS — E119 Type 2 diabetes mellitus without complications: Secondary | ICD-10-CM | POA: Diagnosis not present

## 2022-02-02 DIAGNOSIS — D509 Iron deficiency anemia, unspecified: Secondary | ICD-10-CM | POA: Diagnosis not present

## 2022-02-05 DIAGNOSIS — N186 End stage renal disease: Secondary | ICD-10-CM | POA: Diagnosis not present

## 2022-02-05 DIAGNOSIS — E119 Type 2 diabetes mellitus without complications: Secondary | ICD-10-CM | POA: Diagnosis not present

## 2022-02-05 DIAGNOSIS — D631 Anemia in chronic kidney disease: Secondary | ICD-10-CM | POA: Diagnosis not present

## 2022-02-05 DIAGNOSIS — Z992 Dependence on renal dialysis: Secondary | ICD-10-CM | POA: Diagnosis not present

## 2022-02-05 DIAGNOSIS — D509 Iron deficiency anemia, unspecified: Secondary | ICD-10-CM | POA: Diagnosis not present

## 2022-02-05 DIAGNOSIS — N2581 Secondary hyperparathyroidism of renal origin: Secondary | ICD-10-CM | POA: Diagnosis not present

## 2022-02-06 DIAGNOSIS — I120 Hypertensive chronic kidney disease with stage 5 chronic kidney disease or end stage renal disease: Secondary | ICD-10-CM | POA: Diagnosis not present

## 2022-02-06 DIAGNOSIS — N186 End stage renal disease: Secondary | ICD-10-CM | POA: Diagnosis not present

## 2022-02-06 DIAGNOSIS — Z992 Dependence on renal dialysis: Secondary | ICD-10-CM | POA: Diagnosis not present

## 2022-02-07 DIAGNOSIS — D509 Iron deficiency anemia, unspecified: Secondary | ICD-10-CM | POA: Diagnosis not present

## 2022-02-07 DIAGNOSIS — Z992 Dependence on renal dialysis: Secondary | ICD-10-CM | POA: Diagnosis not present

## 2022-02-07 DIAGNOSIS — D631 Anemia in chronic kidney disease: Secondary | ICD-10-CM | POA: Diagnosis not present

## 2022-02-07 DIAGNOSIS — N186 End stage renal disease: Secondary | ICD-10-CM | POA: Diagnosis not present

## 2022-02-07 DIAGNOSIS — N2581 Secondary hyperparathyroidism of renal origin: Secondary | ICD-10-CM | POA: Diagnosis not present

## 2022-02-07 DIAGNOSIS — E119 Type 2 diabetes mellitus without complications: Secondary | ICD-10-CM | POA: Diagnosis not present

## 2022-02-08 ENCOUNTER — Telehealth: Payer: Self-pay

## 2022-02-08 DIAGNOSIS — N186 End stage renal disease: Secondary | ICD-10-CM

## 2022-02-08 NOTE — Patient Outreach (Signed)
  Care Coordination   Initial Visit Note   02/08/2022 Name: Krishang Reading MRN: [id number] DOB: 02/09/1961  Devontay Celaya is a 61 y.o. year old male who sees Vincente Liberty, MD for primary care. I spoke with  [name] by phone today  What matters to the patients health and wellness today?  Patient reports sometime concerned about running out of food. Also reports difficult to bring cholesterol down despite taking medications as prescribed. He reports has discussed with his primary care provider. States legally blind and written material is not helpful.   Goals Addressed             This Visit's Progress    Manage cholesterol better       Care Coordination Interventions: Advised patient to discuss diet and ways to lower cholesterol with dietician at dialysis center Care Guide referral for food insecurity Encouraged to discuss alternative treatment for cholesterol with primary care provider and cardiologist Reviewed upcoming appointments-next appointment with PCP 02/15/22 Instructed on process for obtaining a new rollator walker. Encouraged to ask provider to fax or provide prescription to medical equipment company         SDOH assessments and interventions completed:  Yes  SDOH Interventions Today    Flowsheet Row Most Recent Value  SDOH Interventions   Food Insecurity Interventions Other (Comment)  [referral to care guide]  Housing Interventions Intervention Not Indicated  Transportation Interventions Intervention Not Indicated        Care Coordination Interventions Activated:  Yes  Care Coordination Interventions:  Yes, provided   Follow up plan: Follow up call scheduled for 04/12/22    Encounter Outcome:  Pt. Visit Completed   Thea Silversmith, RN, MSN, BSN, [place] Coordinator [phone]

## 2022-02-08 NOTE — Telephone Encounter (Signed)
   Telephone encounter was:  Successful.  02/08/2022 Name: Shawn [name] MRN: [id number] DOB: 06-Dec-1960  Shawn [name] is a 61 y.o. year old male who is a primary care patient of Vincente Liberty, MD . The community resource team was consulted for assistance with [place] guide performed the following interventions: Patient provided with information about care guide support team and interviewed to confirm resource needs. Patient advised he is interested in food pantries and is not interested in meals on wheels.  Follow Up Plan:  Care guide will outreach resources to assist patient with food resources.  [place] management  [place], [place]  Main Phone: [phone]  E-mail: [email or web address]  Website: [email or web address]

## 2022-02-09 DIAGNOSIS — Z992 Dependence on renal dialysis: Secondary | ICD-10-CM | POA: Diagnosis not present

## 2022-02-09 DIAGNOSIS — N186 End stage renal disease: Secondary | ICD-10-CM | POA: Diagnosis not present

## 2022-02-09 DIAGNOSIS — N2581 Secondary hyperparathyroidism of renal origin: Secondary | ICD-10-CM | POA: Diagnosis not present

## 2022-02-09 DIAGNOSIS — D509 Iron deficiency anemia, unspecified: Secondary | ICD-10-CM | POA: Diagnosis not present

## 2022-02-09 DIAGNOSIS — D631 Anemia in chronic kidney disease: Secondary | ICD-10-CM | POA: Diagnosis not present

## 2022-02-09 DIAGNOSIS — E119 Type 2 diabetes mellitus without complications: Secondary | ICD-10-CM | POA: Diagnosis not present

## 2022-02-12 DIAGNOSIS — N186 End stage renal disease: Secondary | ICD-10-CM | POA: Diagnosis not present

## 2022-02-12 DIAGNOSIS — E119 Type 2 diabetes mellitus without complications: Secondary | ICD-10-CM | POA: Diagnosis not present

## 2022-02-12 DIAGNOSIS — Z992 Dependence on renal dialysis: Secondary | ICD-10-CM | POA: Diagnosis not present

## 2022-02-12 DIAGNOSIS — D631 Anemia in chronic kidney disease: Secondary | ICD-10-CM | POA: Diagnosis not present

## 2022-02-12 DIAGNOSIS — D509 Iron deficiency anemia, unspecified: Secondary | ICD-10-CM | POA: Diagnosis not present

## 2022-02-12 DIAGNOSIS — N2581 Secondary hyperparathyroidism of renal origin: Secondary | ICD-10-CM | POA: Diagnosis not present

## 2022-02-14 DIAGNOSIS — N186 End stage renal disease: Secondary | ICD-10-CM | POA: Diagnosis not present

## 2022-02-14 DIAGNOSIS — D509 Iron deficiency anemia, unspecified: Secondary | ICD-10-CM | POA: Diagnosis not present

## 2022-02-14 DIAGNOSIS — E119 Type 2 diabetes mellitus without complications: Secondary | ICD-10-CM | POA: Diagnosis not present

## 2022-02-14 DIAGNOSIS — Z992 Dependence on renal dialysis: Secondary | ICD-10-CM | POA: Diagnosis not present

## 2022-02-14 DIAGNOSIS — N2581 Secondary hyperparathyroidism of renal origin: Secondary | ICD-10-CM | POA: Diagnosis not present

## 2022-02-14 DIAGNOSIS — D631 Anemia in chronic kidney disease: Secondary | ICD-10-CM | POA: Diagnosis not present

## 2022-02-15 DIAGNOSIS — I6990 Unspecified sequelae of unspecified cerebrovascular disease: Secondary | ICD-10-CM | POA: Diagnosis not present

## 2022-02-15 DIAGNOSIS — H547 Unspecified visual loss: Secondary | ICD-10-CM | POA: Diagnosis not present

## 2022-02-15 DIAGNOSIS — E7800 Pure hypercholesterolemia, unspecified: Secondary | ICD-10-CM | POA: Diagnosis not present

## 2022-02-15 DIAGNOSIS — Z79899 Other long term (current) drug therapy: Secondary | ICD-10-CM | POA: Diagnosis not present

## 2022-02-15 DIAGNOSIS — M25512 Pain in left shoulder: Secondary | ICD-10-CM | POA: Diagnosis not present

## 2022-02-15 DIAGNOSIS — E559 Vitamin D deficiency, unspecified: Secondary | ICD-10-CM | POA: Diagnosis not present

## 2022-02-15 DIAGNOSIS — Z0001 Encounter for general adult medical examination with abnormal findings: Secondary | ICD-10-CM | POA: Diagnosis not present

## 2022-02-15 DIAGNOSIS — I999 Unspecified disorder of circulatory system: Secondary | ICD-10-CM | POA: Diagnosis not present

## 2022-02-15 DIAGNOSIS — I119 Hypertensive heart disease without heart failure: Secondary | ICD-10-CM | POA: Diagnosis not present

## 2022-02-15 DIAGNOSIS — R55 Syncope and collapse: Secondary | ICD-10-CM | POA: Diagnosis not present

## 2022-02-15 DIAGNOSIS — M1220 Villonodular synovitis (pigmented), unspecified site: Secondary | ICD-10-CM | POA: Diagnosis not present

## 2022-02-15 DIAGNOSIS — E1165 Type 2 diabetes mellitus with hyperglycemia: Secondary | ICD-10-CM | POA: Diagnosis not present

## 2022-02-16 DIAGNOSIS — N186 End stage renal disease: Secondary | ICD-10-CM | POA: Diagnosis not present

## 2022-02-16 DIAGNOSIS — E119 Type 2 diabetes mellitus without complications: Secondary | ICD-10-CM | POA: Diagnosis not present

## 2022-02-16 DIAGNOSIS — D509 Iron deficiency anemia, unspecified: Secondary | ICD-10-CM | POA: Diagnosis not present

## 2022-02-16 DIAGNOSIS — Z992 Dependence on renal dialysis: Secondary | ICD-10-CM | POA: Diagnosis not present

## 2022-02-16 DIAGNOSIS — N2581 Secondary hyperparathyroidism of renal origin: Secondary | ICD-10-CM | POA: Diagnosis not present

## 2022-02-16 DIAGNOSIS — D631 Anemia in chronic kidney disease: Secondary | ICD-10-CM | POA: Diagnosis not present

## 2022-02-19 DIAGNOSIS — N2581 Secondary hyperparathyroidism of renal origin: Secondary | ICD-10-CM | POA: Diagnosis not present

## 2022-02-19 DIAGNOSIS — N186 End stage renal disease: Secondary | ICD-10-CM | POA: Diagnosis not present

## 2022-02-19 DIAGNOSIS — Z992 Dependence on renal dialysis: Secondary | ICD-10-CM | POA: Diagnosis not present

## 2022-02-19 DIAGNOSIS — D509 Iron deficiency anemia, unspecified: Secondary | ICD-10-CM | POA: Diagnosis not present

## 2022-02-19 DIAGNOSIS — D631 Anemia in chronic kidney disease: Secondary | ICD-10-CM | POA: Diagnosis not present

## 2022-02-19 DIAGNOSIS — E119 Type 2 diabetes mellitus without complications: Secondary | ICD-10-CM | POA: Diagnosis not present

## 2022-02-21 DIAGNOSIS — D509 Iron deficiency anemia, unspecified: Secondary | ICD-10-CM | POA: Diagnosis not present

## 2022-02-21 DIAGNOSIS — Z992 Dependence on renal dialysis: Secondary | ICD-10-CM | POA: Diagnosis not present

## 2022-02-21 DIAGNOSIS — N186 End stage renal disease: Secondary | ICD-10-CM | POA: Diagnosis not present

## 2022-02-21 DIAGNOSIS — D631 Anemia in chronic kidney disease: Secondary | ICD-10-CM | POA: Diagnosis not present

## 2022-02-21 DIAGNOSIS — E119 Type 2 diabetes mellitus without complications: Secondary | ICD-10-CM | POA: Diagnosis not present

## 2022-02-21 DIAGNOSIS — N2581 Secondary hyperparathyroidism of renal origin: Secondary | ICD-10-CM | POA: Diagnosis not present

## 2022-02-23 DIAGNOSIS — N2581 Secondary hyperparathyroidism of renal origin: Secondary | ICD-10-CM | POA: Diagnosis not present

## 2022-02-23 DIAGNOSIS — Z992 Dependence on renal dialysis: Secondary | ICD-10-CM | POA: Diagnosis not present

## 2022-02-23 DIAGNOSIS — D631 Anemia in chronic kidney disease: Secondary | ICD-10-CM | POA: Diagnosis not present

## 2022-02-23 DIAGNOSIS — D509 Iron deficiency anemia, unspecified: Secondary | ICD-10-CM | POA: Diagnosis not present

## 2022-02-23 DIAGNOSIS — N186 End stage renal disease: Secondary | ICD-10-CM | POA: Diagnosis not present

## 2022-02-23 DIAGNOSIS — E119 Type 2 diabetes mellitus without complications: Secondary | ICD-10-CM | POA: Diagnosis not present

## 2022-02-26 DIAGNOSIS — N2581 Secondary hyperparathyroidism of renal origin: Secondary | ICD-10-CM | POA: Diagnosis not present

## 2022-02-26 DIAGNOSIS — E119 Type 2 diabetes mellitus without complications: Secondary | ICD-10-CM | POA: Diagnosis not present

## 2022-02-26 DIAGNOSIS — D631 Anemia in chronic kidney disease: Secondary | ICD-10-CM | POA: Diagnosis not present

## 2022-02-26 DIAGNOSIS — N186 End stage renal disease: Secondary | ICD-10-CM | POA: Diagnosis not present

## 2022-02-26 DIAGNOSIS — Z992 Dependence on renal dialysis: Secondary | ICD-10-CM | POA: Diagnosis not present

## 2022-02-26 DIAGNOSIS — D509 Iron deficiency anemia, unspecified: Secondary | ICD-10-CM | POA: Diagnosis not present

## 2022-02-27 ENCOUNTER — Ambulatory Visit (INDEPENDENT_AMBULATORY_CARE_PROVIDER_SITE_OTHER): Payer: Medicare Other | Admitting: Podiatry

## 2022-02-27 DIAGNOSIS — N186 End stage renal disease: Secondary | ICD-10-CM | POA: Diagnosis not present

## 2022-02-27 DIAGNOSIS — B351 Tinea unguium: Secondary | ICD-10-CM

## 2022-02-27 DIAGNOSIS — Z992 Dependence on renal dialysis: Secondary | ICD-10-CM | POA: Diagnosis not present

## 2022-02-27 DIAGNOSIS — M79675 Pain in left toe(s): Secondary | ICD-10-CM | POA: Diagnosis not present

## 2022-02-27 DIAGNOSIS — E1151 Type 2 diabetes mellitus with diabetic peripheral angiopathy without gangrene: Secondary | ICD-10-CM

## 2022-02-27 DIAGNOSIS — M79674 Pain in right toe(s): Secondary | ICD-10-CM

## 2022-02-28 ENCOUNTER — Telehealth: Payer: Self-pay

## 2022-02-28 DIAGNOSIS — N2581 Secondary hyperparathyroidism of renal origin: Secondary | ICD-10-CM | POA: Diagnosis not present

## 2022-02-28 DIAGNOSIS — N186 End stage renal disease: Secondary | ICD-10-CM | POA: Diagnosis not present

## 2022-02-28 DIAGNOSIS — D509 Iron deficiency anemia, unspecified: Secondary | ICD-10-CM | POA: Diagnosis not present

## 2022-02-28 DIAGNOSIS — Z992 Dependence on renal dialysis: Secondary | ICD-10-CM | POA: Diagnosis not present

## 2022-02-28 DIAGNOSIS — D631 Anemia in chronic kidney disease: Secondary | ICD-10-CM | POA: Diagnosis not present

## 2022-02-28 DIAGNOSIS — E119 Type 2 diabetes mellitus without complications: Secondary | ICD-10-CM | POA: Diagnosis not present

## 2022-02-28 NOTE — Telephone Encounter (Signed)
   Telephone encounter was:  Successful.  02/28/2022 Name: [name] MRN: [id number] DOB: Nov 16, 1960  Shawn [name] is a 61 y.o. year old male who is a primary care patient of Vincente Liberty, MD . The community resource team was consulted for assistance with [place] guide performed the following interventions: Follow up call placed to the patient to discuss status of referral. Patient was interest in [facility] but after speaking further to patient about it, he declined. Patient advised pantry information will work. CG sent [place] and [employer] information as well as Research officer, political party. Pt has been advised: I have mailed the following information and if he has not received the information in 7 to 14 days or have additional questions to please call me back at [phone]. Patient understood.  Follow Up Plan:  No further follow up planned at this time. The patient has been provided with needed resources.  [place] management  [place], [place]  Main Phone: [phone]  E-mail: [email or web address]  Website: [email or web address]

## 2022-03-02 DIAGNOSIS — D631 Anemia in chronic kidney disease: Secondary | ICD-10-CM | POA: Diagnosis not present

## 2022-03-02 DIAGNOSIS — N186 End stage renal disease: Secondary | ICD-10-CM | POA: Diagnosis not present

## 2022-03-02 DIAGNOSIS — Z992 Dependence on renal dialysis: Secondary | ICD-10-CM | POA: Diagnosis not present

## 2022-03-02 DIAGNOSIS — D509 Iron deficiency anemia, unspecified: Secondary | ICD-10-CM | POA: Diagnosis not present

## 2022-03-02 DIAGNOSIS — E119 Type 2 diabetes mellitus without complications: Secondary | ICD-10-CM | POA: Diagnosis not present

## 2022-03-02 DIAGNOSIS — N2581 Secondary hyperparathyroidism of renal origin: Secondary | ICD-10-CM | POA: Diagnosis not present

## 2022-03-05 DIAGNOSIS — E119 Type 2 diabetes mellitus without complications: Secondary | ICD-10-CM | POA: Diagnosis not present

## 2022-03-05 DIAGNOSIS — D509 Iron deficiency anemia, unspecified: Secondary | ICD-10-CM | POA: Diagnosis not present

## 2022-03-05 DIAGNOSIS — N2581 Secondary hyperparathyroidism of renal origin: Secondary | ICD-10-CM | POA: Diagnosis not present

## 2022-03-05 DIAGNOSIS — D631 Anemia in chronic kidney disease: Secondary | ICD-10-CM | POA: Diagnosis not present

## 2022-03-05 DIAGNOSIS — Z992 Dependence on renal dialysis: Secondary | ICD-10-CM | POA: Diagnosis not present

## 2022-03-05 DIAGNOSIS — N186 End stage renal disease: Secondary | ICD-10-CM | POA: Diagnosis not present

## 2022-03-06 NOTE — Progress Notes (Signed)
  Subjective:  Patient ID: Shawn [name], male    DOB: [birth year],  MRN: [id number]  61 y.o. male presents with at risk foot care with h/o NIDDM with ESRD on hemodialysis and painful elongated mycotic toenails 1-5 bilaterally which are tender when wearing enclosed shoe gear. Pain is relieved with periodic professional debridement..    Last known  HgA1c was 6.8%. Patient did not check blood glucose this morning.  Patient is accompanied by his wife on today's visit.  PCP: Vincente Liberty, MD and last visit was: 02/15/2022.  New problem(s): None.   Review of Systems: Negative except as noted in the HPI.   No Known Allergies  Objective:  There were no vitals filed for this visit. Constitutional Patient is a pleasant 61 y.o. African American male thin build in NAD. AAO x 3.  Vascular Capillary fill time to digits <3 seconds.  DP/PT pulse(s) are nonpalpable b/l lower extremities. Pedal hair absent b/l. Lower extremity skin temperature gradient warm to warm b/l. No pain with calf compression b/l. No cyanosis or clubbing noted. No ischemia nor gangrene noted b/l.   Neurologic Protective sensation diminished with 10g monofilament b/l.  Dermatologic Pedal skin is thin, shiny and atrophic b/l.  No open wounds b/l lower extremities. No interdigital macerations b/l lower extremities. Toenails 1-5 b/l elongated, discolored, dystrophic, thickened, crumbly with subungual debris and tenderness to dorsal palpation.   Orthopedic: Normal muscle strength 5/5 to all lower extremity muscle groups bilaterally. Hammertoe(s) noted to the bilateral 2nd toes.   Assessment:   1. Pain due to onychomycosis of toenails of both feet   2. ESRD on dialysis ([place])   3. Type II diabetes mellitus with peripheral circulatory disorder [facility])    Plan:  Patient was evaluated and treated and all questions answered. Consent given for treatment as described below: -Examined patient. -Continue foot and shoe inspections  daily. Monitor blood glucose per PCP/Endocrinologist's recommendations. -Patient to continue soft, supportive shoe gear daily. -Mycotic toenails 1-5 bilaterally were debrided in length and girth with sterile nail nippers and dremel without incident. -Patient/POA to call should there be question/concern in the interim.  Return in about 9 weeks (around 05/01/2022).  Marzetta Board, DPM

## 2022-03-07 DIAGNOSIS — D631 Anemia in chronic kidney disease: Secondary | ICD-10-CM | POA: Diagnosis not present

## 2022-03-07 DIAGNOSIS — E119 Type 2 diabetes mellitus without complications: Secondary | ICD-10-CM | POA: Diagnosis not present

## 2022-03-07 DIAGNOSIS — N186 End stage renal disease: Secondary | ICD-10-CM | POA: Diagnosis not present

## 2022-03-07 DIAGNOSIS — N2581 Secondary hyperparathyroidism of renal origin: Secondary | ICD-10-CM | POA: Diagnosis not present

## 2022-03-07 DIAGNOSIS — D509 Iron deficiency anemia, unspecified: Secondary | ICD-10-CM | POA: Diagnosis not present

## 2022-03-07 DIAGNOSIS — Z992 Dependence on renal dialysis: Secondary | ICD-10-CM | POA: Diagnosis not present

## 2022-03-08 DIAGNOSIS — D631 Anemia in chronic kidney disease: Secondary | ICD-10-CM | POA: Diagnosis not present

## 2022-03-08 DIAGNOSIS — N2581 Secondary hyperparathyroidism of renal origin: Secondary | ICD-10-CM | POA: Diagnosis not present

## 2022-03-08 DIAGNOSIS — Z992 Dependence on renal dialysis: Secondary | ICD-10-CM | POA: Diagnosis not present

## 2022-03-08 DIAGNOSIS — D509 Iron deficiency anemia, unspecified: Secondary | ICD-10-CM | POA: Diagnosis not present

## 2022-03-08 DIAGNOSIS — E119 Type 2 diabetes mellitus without complications: Secondary | ICD-10-CM | POA: Diagnosis not present

## 2022-03-08 DIAGNOSIS — N186 End stage renal disease: Secondary | ICD-10-CM | POA: Diagnosis not present

## 2022-03-09 DIAGNOSIS — I120 Hypertensive chronic kidney disease with stage 5 chronic kidney disease or end stage renal disease: Secondary | ICD-10-CM | POA: Diagnosis not present

## 2022-03-09 DIAGNOSIS — Z992 Dependence on renal dialysis: Secondary | ICD-10-CM | POA: Diagnosis not present

## 2022-03-09 DIAGNOSIS — N186 End stage renal disease: Secondary | ICD-10-CM | POA: Diagnosis not present

## 2022-03-12 DIAGNOSIS — N186 End stage renal disease: Secondary | ICD-10-CM | POA: Diagnosis not present

## 2022-03-12 DIAGNOSIS — D631 Anemia in chronic kidney disease: Secondary | ICD-10-CM | POA: Diagnosis not present

## 2022-03-12 DIAGNOSIS — D509 Iron deficiency anemia, unspecified: Secondary | ICD-10-CM | POA: Diagnosis not present

## 2022-03-12 DIAGNOSIS — E119 Type 2 diabetes mellitus without complications: Secondary | ICD-10-CM | POA: Diagnosis not present

## 2022-03-12 DIAGNOSIS — N2581 Secondary hyperparathyroidism of renal origin: Secondary | ICD-10-CM | POA: Diagnosis not present

## 2022-03-12 DIAGNOSIS — Z992 Dependence on renal dialysis: Secondary | ICD-10-CM | POA: Diagnosis not present

## 2022-03-14 DIAGNOSIS — D509 Iron deficiency anemia, unspecified: Secondary | ICD-10-CM | POA: Diagnosis not present

## 2022-03-14 DIAGNOSIS — N186 End stage renal disease: Secondary | ICD-10-CM | POA: Diagnosis not present

## 2022-03-14 DIAGNOSIS — E119 Type 2 diabetes mellitus without complications: Secondary | ICD-10-CM | POA: Diagnosis not present

## 2022-03-14 DIAGNOSIS — N2581 Secondary hyperparathyroidism of renal origin: Secondary | ICD-10-CM | POA: Diagnosis not present

## 2022-03-14 DIAGNOSIS — D631 Anemia in chronic kidney disease: Secondary | ICD-10-CM | POA: Diagnosis not present

## 2022-03-14 DIAGNOSIS — Z992 Dependence on renal dialysis: Secondary | ICD-10-CM | POA: Diagnosis not present

## 2022-03-16 DIAGNOSIS — Z992 Dependence on renal dialysis: Secondary | ICD-10-CM | POA: Diagnosis not present

## 2022-03-16 DIAGNOSIS — N186 End stage renal disease: Secondary | ICD-10-CM | POA: Diagnosis not present

## 2022-03-16 DIAGNOSIS — D509 Iron deficiency anemia, unspecified: Secondary | ICD-10-CM | POA: Diagnosis not present

## 2022-03-16 DIAGNOSIS — N2581 Secondary hyperparathyroidism of renal origin: Secondary | ICD-10-CM | POA: Diagnosis not present

## 2022-03-16 DIAGNOSIS — E119 Type 2 diabetes mellitus without complications: Secondary | ICD-10-CM | POA: Diagnosis not present

## 2022-03-16 DIAGNOSIS — D631 Anemia in chronic kidney disease: Secondary | ICD-10-CM | POA: Diagnosis not present

## 2022-03-19 DIAGNOSIS — E119 Type 2 diabetes mellitus without complications: Secondary | ICD-10-CM | POA: Diagnosis not present

## 2022-03-19 DIAGNOSIS — D631 Anemia in chronic kidney disease: Secondary | ICD-10-CM | POA: Diagnosis not present

## 2022-03-19 DIAGNOSIS — D509 Iron deficiency anemia, unspecified: Secondary | ICD-10-CM | POA: Diagnosis not present

## 2022-03-19 DIAGNOSIS — Z992 Dependence on renal dialysis: Secondary | ICD-10-CM | POA: Diagnosis not present

## 2022-03-19 DIAGNOSIS — N2581 Secondary hyperparathyroidism of renal origin: Secondary | ICD-10-CM | POA: Diagnosis not present

## 2022-03-19 DIAGNOSIS — N186 End stage renal disease: Secondary | ICD-10-CM | POA: Diagnosis not present

## 2022-03-21 DIAGNOSIS — D509 Iron deficiency anemia, unspecified: Secondary | ICD-10-CM | POA: Diagnosis not present

## 2022-03-21 DIAGNOSIS — E119 Type 2 diabetes mellitus without complications: Secondary | ICD-10-CM | POA: Diagnosis not present

## 2022-03-21 DIAGNOSIS — D631 Anemia in chronic kidney disease: Secondary | ICD-10-CM | POA: Diagnosis not present

## 2022-03-21 DIAGNOSIS — N186 End stage renal disease: Secondary | ICD-10-CM | POA: Diagnosis not present

## 2022-03-21 DIAGNOSIS — N2581 Secondary hyperparathyroidism of renal origin: Secondary | ICD-10-CM | POA: Diagnosis not present

## 2022-03-21 DIAGNOSIS — Z992 Dependence on renal dialysis: Secondary | ICD-10-CM | POA: Diagnosis not present

## 2022-03-23 DIAGNOSIS — D509 Iron deficiency anemia, unspecified: Secondary | ICD-10-CM | POA: Diagnosis not present

## 2022-03-23 DIAGNOSIS — Z992 Dependence on renal dialysis: Secondary | ICD-10-CM | POA: Diagnosis not present

## 2022-03-23 DIAGNOSIS — N186 End stage renal disease: Secondary | ICD-10-CM | POA: Diagnosis not present

## 2022-03-23 DIAGNOSIS — N2581 Secondary hyperparathyroidism of renal origin: Secondary | ICD-10-CM | POA: Diagnosis not present

## 2022-03-23 DIAGNOSIS — D631 Anemia in chronic kidney disease: Secondary | ICD-10-CM | POA: Diagnosis not present

## 2022-03-23 DIAGNOSIS — E119 Type 2 diabetes mellitus without complications: Secondary | ICD-10-CM | POA: Diagnosis not present

## 2022-03-26 DIAGNOSIS — N2581 Secondary hyperparathyroidism of renal origin: Secondary | ICD-10-CM | POA: Diagnosis not present

## 2022-03-26 DIAGNOSIS — D509 Iron deficiency anemia, unspecified: Secondary | ICD-10-CM | POA: Diagnosis not present

## 2022-03-26 DIAGNOSIS — D631 Anemia in chronic kidney disease: Secondary | ICD-10-CM | POA: Diagnosis not present

## 2022-03-26 DIAGNOSIS — E119 Type 2 diabetes mellitus without complications: Secondary | ICD-10-CM | POA: Diagnosis not present

## 2022-03-26 DIAGNOSIS — N186 End stage renal disease: Secondary | ICD-10-CM | POA: Diagnosis not present

## 2022-03-26 DIAGNOSIS — Z992 Dependence on renal dialysis: Secondary | ICD-10-CM | POA: Diagnosis not present

## 2022-03-28 DIAGNOSIS — D631 Anemia in chronic kidney disease: Secondary | ICD-10-CM | POA: Diagnosis not present

## 2022-03-28 DIAGNOSIS — E119 Type 2 diabetes mellitus without complications: Secondary | ICD-10-CM | POA: Diagnosis not present

## 2022-03-28 DIAGNOSIS — N186 End stage renal disease: Secondary | ICD-10-CM | POA: Diagnosis not present

## 2022-03-28 DIAGNOSIS — N2581 Secondary hyperparathyroidism of renal origin: Secondary | ICD-10-CM | POA: Diagnosis not present

## 2022-03-28 DIAGNOSIS — Z992 Dependence on renal dialysis: Secondary | ICD-10-CM | POA: Diagnosis not present

## 2022-03-28 DIAGNOSIS — D509 Iron deficiency anemia, unspecified: Secondary | ICD-10-CM | POA: Diagnosis not present

## 2022-03-30 DIAGNOSIS — N2581 Secondary hyperparathyroidism of renal origin: Secondary | ICD-10-CM | POA: Diagnosis not present

## 2022-03-30 DIAGNOSIS — D631 Anemia in chronic kidney disease: Secondary | ICD-10-CM | POA: Diagnosis not present

## 2022-03-30 DIAGNOSIS — E119 Type 2 diabetes mellitus without complications: Secondary | ICD-10-CM | POA: Diagnosis not present

## 2022-03-30 DIAGNOSIS — D509 Iron deficiency anemia, unspecified: Secondary | ICD-10-CM | POA: Diagnosis not present

## 2022-03-30 DIAGNOSIS — Z992 Dependence on renal dialysis: Secondary | ICD-10-CM | POA: Diagnosis not present

## 2022-03-30 DIAGNOSIS — N186 End stage renal disease: Secondary | ICD-10-CM | POA: Diagnosis not present

## 2022-04-02 DIAGNOSIS — N186 End stage renal disease: Secondary | ICD-10-CM | POA: Diagnosis not present

## 2022-04-02 DIAGNOSIS — Z992 Dependence on renal dialysis: Secondary | ICD-10-CM | POA: Diagnosis not present

## 2022-04-02 DIAGNOSIS — N2581 Secondary hyperparathyroidism of renal origin: Secondary | ICD-10-CM | POA: Diagnosis not present

## 2022-04-02 DIAGNOSIS — E119 Type 2 diabetes mellitus without complications: Secondary | ICD-10-CM | POA: Diagnosis not present

## 2022-04-02 DIAGNOSIS — D631 Anemia in chronic kidney disease: Secondary | ICD-10-CM | POA: Diagnosis not present

## 2022-04-02 DIAGNOSIS — D509 Iron deficiency anemia, unspecified: Secondary | ICD-10-CM | POA: Diagnosis not present

## 2022-04-04 DIAGNOSIS — E119 Type 2 diabetes mellitus without complications: Secondary | ICD-10-CM | POA: Diagnosis not present

## 2022-04-04 DIAGNOSIS — Z992 Dependence on renal dialysis: Secondary | ICD-10-CM | POA: Diagnosis not present

## 2022-04-04 DIAGNOSIS — N186 End stage renal disease: Secondary | ICD-10-CM | POA: Diagnosis not present

## 2022-04-04 DIAGNOSIS — D631 Anemia in chronic kidney disease: Secondary | ICD-10-CM | POA: Diagnosis not present

## 2022-04-04 DIAGNOSIS — D509 Iron deficiency anemia, unspecified: Secondary | ICD-10-CM | POA: Diagnosis not present

## 2022-04-04 DIAGNOSIS — N2581 Secondary hyperparathyroidism of renal origin: Secondary | ICD-10-CM | POA: Diagnosis not present

## 2022-04-06 DIAGNOSIS — Z992 Dependence on renal dialysis: Secondary | ICD-10-CM | POA: Diagnosis not present

## 2022-04-06 DIAGNOSIS — N186 End stage renal disease: Secondary | ICD-10-CM | POA: Diagnosis not present

## 2022-04-06 DIAGNOSIS — E119 Type 2 diabetes mellitus without complications: Secondary | ICD-10-CM | POA: Diagnosis not present

## 2022-04-06 DIAGNOSIS — N2581 Secondary hyperparathyroidism of renal origin: Secondary | ICD-10-CM | POA: Diagnosis not present

## 2022-04-06 DIAGNOSIS — D631 Anemia in chronic kidney disease: Secondary | ICD-10-CM | POA: Diagnosis not present

## 2022-04-06 DIAGNOSIS — D509 Iron deficiency anemia, unspecified: Secondary | ICD-10-CM | POA: Diagnosis not present

## 2022-04-08 DIAGNOSIS — Z992 Dependence on renal dialysis: Secondary | ICD-10-CM | POA: Diagnosis not present

## 2022-04-08 DIAGNOSIS — N186 End stage renal disease: Secondary | ICD-10-CM | POA: Diagnosis not present

## 2022-04-08 DIAGNOSIS — I120 Hypertensive chronic kidney disease with stage 5 chronic kidney disease or end stage renal disease: Secondary | ICD-10-CM | POA: Diagnosis not present

## 2022-04-09 DIAGNOSIS — N186 End stage renal disease: Secondary | ICD-10-CM | POA: Diagnosis not present

## 2022-04-09 DIAGNOSIS — N2581 Secondary hyperparathyroidism of renal origin: Secondary | ICD-10-CM | POA: Diagnosis not present

## 2022-04-09 DIAGNOSIS — E1129 Type 2 diabetes mellitus with other diabetic kidney complication: Secondary | ICD-10-CM | POA: Diagnosis not present

## 2022-04-09 DIAGNOSIS — D509 Iron deficiency anemia, unspecified: Secondary | ICD-10-CM | POA: Diagnosis not present

## 2022-04-09 DIAGNOSIS — E119 Type 2 diabetes mellitus without complications: Secondary | ICD-10-CM | POA: Diagnosis not present

## 2022-04-09 DIAGNOSIS — E876 Hypokalemia: Secondary | ICD-10-CM | POA: Diagnosis not present

## 2022-04-09 DIAGNOSIS — Z23 Encounter for immunization: Secondary | ICD-10-CM | POA: Diagnosis not present

## 2022-04-09 DIAGNOSIS — Z992 Dependence on renal dialysis: Secondary | ICD-10-CM | POA: Diagnosis not present

## 2022-04-09 DIAGNOSIS — D631 Anemia in chronic kidney disease: Secondary | ICD-10-CM | POA: Diagnosis not present

## 2022-04-11 DIAGNOSIS — N2581 Secondary hyperparathyroidism of renal origin: Secondary | ICD-10-CM | POA: Diagnosis not present

## 2022-04-11 DIAGNOSIS — E876 Hypokalemia: Secondary | ICD-10-CM | POA: Diagnosis not present

## 2022-04-11 DIAGNOSIS — Z992 Dependence on renal dialysis: Secondary | ICD-10-CM | POA: Diagnosis not present

## 2022-04-11 DIAGNOSIS — D631 Anemia in chronic kidney disease: Secondary | ICD-10-CM | POA: Diagnosis not present

## 2022-04-11 DIAGNOSIS — D509 Iron deficiency anemia, unspecified: Secondary | ICD-10-CM | POA: Diagnosis not present

## 2022-04-11 DIAGNOSIS — N186 End stage renal disease: Secondary | ICD-10-CM | POA: Diagnosis not present

## 2022-04-13 DIAGNOSIS — N186 End stage renal disease: Secondary | ICD-10-CM | POA: Diagnosis not present

## 2022-04-13 DIAGNOSIS — Z992 Dependence on renal dialysis: Secondary | ICD-10-CM | POA: Diagnosis not present

## 2022-04-13 DIAGNOSIS — E876 Hypokalemia: Secondary | ICD-10-CM | POA: Diagnosis not present

## 2022-04-13 DIAGNOSIS — D509 Iron deficiency anemia, unspecified: Secondary | ICD-10-CM | POA: Diagnosis not present

## 2022-04-13 DIAGNOSIS — N2581 Secondary hyperparathyroidism of renal origin: Secondary | ICD-10-CM | POA: Diagnosis not present

## 2022-04-13 DIAGNOSIS — D631 Anemia in chronic kidney disease: Secondary | ICD-10-CM | POA: Diagnosis not present

## 2022-04-16 DIAGNOSIS — E876 Hypokalemia: Secondary | ICD-10-CM | POA: Diagnosis not present

## 2022-04-16 DIAGNOSIS — N186 End stage renal disease: Secondary | ICD-10-CM | POA: Diagnosis not present

## 2022-04-16 DIAGNOSIS — D509 Iron deficiency anemia, unspecified: Secondary | ICD-10-CM | POA: Diagnosis not present

## 2022-04-16 DIAGNOSIS — D631 Anemia in chronic kidney disease: Secondary | ICD-10-CM | POA: Diagnosis not present

## 2022-04-16 DIAGNOSIS — N2581 Secondary hyperparathyroidism of renal origin: Secondary | ICD-10-CM | POA: Diagnosis not present

## 2022-04-16 DIAGNOSIS — Z992 Dependence on renal dialysis: Secondary | ICD-10-CM | POA: Diagnosis not present

## 2022-04-18 DIAGNOSIS — E876 Hypokalemia: Secondary | ICD-10-CM | POA: Diagnosis not present

## 2022-04-18 DIAGNOSIS — Z992 Dependence on renal dialysis: Secondary | ICD-10-CM | POA: Diagnosis not present

## 2022-04-18 DIAGNOSIS — D509 Iron deficiency anemia, unspecified: Secondary | ICD-10-CM | POA: Diagnosis not present

## 2022-04-18 DIAGNOSIS — N2581 Secondary hyperparathyroidism of renal origin: Secondary | ICD-10-CM | POA: Diagnosis not present

## 2022-04-18 DIAGNOSIS — D631 Anemia in chronic kidney disease: Secondary | ICD-10-CM | POA: Diagnosis not present

## 2022-04-18 DIAGNOSIS — N186 End stage renal disease: Secondary | ICD-10-CM | POA: Diagnosis not present

## 2022-04-20 DIAGNOSIS — E876 Hypokalemia: Secondary | ICD-10-CM | POA: Diagnosis not present

## 2022-04-20 DIAGNOSIS — N186 End stage renal disease: Secondary | ICD-10-CM | POA: Diagnosis not present

## 2022-04-20 DIAGNOSIS — Z992 Dependence on renal dialysis: Secondary | ICD-10-CM | POA: Diagnosis not present

## 2022-04-20 DIAGNOSIS — N2581 Secondary hyperparathyroidism of renal origin: Secondary | ICD-10-CM | POA: Diagnosis not present

## 2022-04-20 DIAGNOSIS — D631 Anemia in chronic kidney disease: Secondary | ICD-10-CM | POA: Diagnosis not present

## 2022-04-20 DIAGNOSIS — D509 Iron deficiency anemia, unspecified: Secondary | ICD-10-CM | POA: Diagnosis not present

## 2022-04-23 DIAGNOSIS — D631 Anemia in chronic kidney disease: Secondary | ICD-10-CM | POA: Diagnosis not present

## 2022-04-23 DIAGNOSIS — N186 End stage renal disease: Secondary | ICD-10-CM | POA: Diagnosis not present

## 2022-04-23 DIAGNOSIS — N2581 Secondary hyperparathyroidism of renal origin: Secondary | ICD-10-CM | POA: Diagnosis not present

## 2022-04-23 DIAGNOSIS — E876 Hypokalemia: Secondary | ICD-10-CM | POA: Diagnosis not present

## 2022-04-23 DIAGNOSIS — D509 Iron deficiency anemia, unspecified: Secondary | ICD-10-CM | POA: Diagnosis not present

## 2022-04-23 DIAGNOSIS — Z992 Dependence on renal dialysis: Secondary | ICD-10-CM | POA: Diagnosis not present

## 2022-04-25 DIAGNOSIS — Z992 Dependence on renal dialysis: Secondary | ICD-10-CM | POA: Diagnosis not present

## 2022-04-25 DIAGNOSIS — N186 End stage renal disease: Secondary | ICD-10-CM | POA: Diagnosis not present

## 2022-04-25 DIAGNOSIS — D631 Anemia in chronic kidney disease: Secondary | ICD-10-CM | POA: Diagnosis not present

## 2022-04-25 DIAGNOSIS — E876 Hypokalemia: Secondary | ICD-10-CM | POA: Diagnosis not present

## 2022-04-25 DIAGNOSIS — D509 Iron deficiency anemia, unspecified: Secondary | ICD-10-CM | POA: Diagnosis not present

## 2022-04-25 DIAGNOSIS — E1129 Type 2 diabetes mellitus with other diabetic kidney complication: Secondary | ICD-10-CM | POA: Diagnosis not present

## 2022-04-25 DIAGNOSIS — N2581 Secondary hyperparathyroidism of renal origin: Secondary | ICD-10-CM | POA: Diagnosis not present

## 2022-04-27 DIAGNOSIS — N2581 Secondary hyperparathyroidism of renal origin: Secondary | ICD-10-CM | POA: Diagnosis not present

## 2022-04-27 DIAGNOSIS — E876 Hypokalemia: Secondary | ICD-10-CM | POA: Diagnosis not present

## 2022-04-27 DIAGNOSIS — D631 Anemia in chronic kidney disease: Secondary | ICD-10-CM | POA: Diagnosis not present

## 2022-04-27 DIAGNOSIS — Z992 Dependence on renal dialysis: Secondary | ICD-10-CM | POA: Diagnosis not present

## 2022-04-27 DIAGNOSIS — N186 End stage renal disease: Secondary | ICD-10-CM | POA: Diagnosis not present

## 2022-04-27 DIAGNOSIS — D509 Iron deficiency anemia, unspecified: Secondary | ICD-10-CM | POA: Diagnosis not present

## 2022-04-30 DIAGNOSIS — D631 Anemia in chronic kidney disease: Secondary | ICD-10-CM | POA: Diagnosis not present

## 2022-04-30 DIAGNOSIS — Z992 Dependence on renal dialysis: Secondary | ICD-10-CM | POA: Diagnosis not present

## 2022-04-30 DIAGNOSIS — N2581 Secondary hyperparathyroidism of renal origin: Secondary | ICD-10-CM | POA: Diagnosis not present

## 2022-04-30 DIAGNOSIS — E876 Hypokalemia: Secondary | ICD-10-CM | POA: Diagnosis not present

## 2022-04-30 DIAGNOSIS — N186 End stage renal disease: Secondary | ICD-10-CM | POA: Diagnosis not present

## 2022-04-30 DIAGNOSIS — D509 Iron deficiency anemia, unspecified: Secondary | ICD-10-CM | POA: Diagnosis not present

## 2022-05-01 ENCOUNTER — Ambulatory Visit: Payer: Medicare Other | Admitting: Podiatry

## 2022-05-02 DIAGNOSIS — E876 Hypokalemia: Secondary | ICD-10-CM | POA: Diagnosis not present

## 2022-05-02 DIAGNOSIS — N2581 Secondary hyperparathyroidism of renal origin: Secondary | ICD-10-CM | POA: Diagnosis not present

## 2022-05-02 DIAGNOSIS — N186 End stage renal disease: Secondary | ICD-10-CM | POA: Diagnosis not present

## 2022-05-02 DIAGNOSIS — Z992 Dependence on renal dialysis: Secondary | ICD-10-CM | POA: Diagnosis not present

## 2022-05-02 DIAGNOSIS — D509 Iron deficiency anemia, unspecified: Secondary | ICD-10-CM | POA: Diagnosis not present

## 2022-05-02 DIAGNOSIS — D631 Anemia in chronic kidney disease: Secondary | ICD-10-CM | POA: Diagnosis not present

## 2022-05-04 DIAGNOSIS — D631 Anemia in chronic kidney disease: Secondary | ICD-10-CM | POA: Diagnosis not present

## 2022-05-04 DIAGNOSIS — E876 Hypokalemia: Secondary | ICD-10-CM | POA: Diagnosis not present

## 2022-05-04 DIAGNOSIS — Z992 Dependence on renal dialysis: Secondary | ICD-10-CM | POA: Diagnosis not present

## 2022-05-04 DIAGNOSIS — D509 Iron deficiency anemia, unspecified: Secondary | ICD-10-CM | POA: Diagnosis not present

## 2022-05-04 DIAGNOSIS — N2581 Secondary hyperparathyroidism of renal origin: Secondary | ICD-10-CM | POA: Diagnosis not present

## 2022-05-04 DIAGNOSIS — N186 End stage renal disease: Secondary | ICD-10-CM | POA: Diagnosis not present

## 2022-05-07 DIAGNOSIS — N186 End stage renal disease: Secondary | ICD-10-CM | POA: Diagnosis not present

## 2022-05-07 DIAGNOSIS — N2581 Secondary hyperparathyroidism of renal origin: Secondary | ICD-10-CM | POA: Diagnosis not present

## 2022-05-07 DIAGNOSIS — D509 Iron deficiency anemia, unspecified: Secondary | ICD-10-CM | POA: Diagnosis not present

## 2022-05-07 DIAGNOSIS — E876 Hypokalemia: Secondary | ICD-10-CM | POA: Diagnosis not present

## 2022-05-07 DIAGNOSIS — D631 Anemia in chronic kidney disease: Secondary | ICD-10-CM | POA: Diagnosis not present

## 2022-05-07 DIAGNOSIS — Z992 Dependence on renal dialysis: Secondary | ICD-10-CM | POA: Diagnosis not present

## 2022-05-08 ENCOUNTER — Encounter: Payer: Self-pay | Admitting: Podiatry

## 2022-05-08 ENCOUNTER — Ambulatory Visit (INDEPENDENT_AMBULATORY_CARE_PROVIDER_SITE_OTHER): Payer: Medicare Other | Admitting: Podiatry

## 2022-05-08 DIAGNOSIS — Z992 Dependence on renal dialysis: Secondary | ICD-10-CM

## 2022-05-08 DIAGNOSIS — M79674 Pain in right toe(s): Secondary | ICD-10-CM | POA: Diagnosis not present

## 2022-05-08 DIAGNOSIS — M79675 Pain in left toe(s): Secondary | ICD-10-CM | POA: Diagnosis not present

## 2022-05-08 DIAGNOSIS — E1151 Type 2 diabetes mellitus with diabetic peripheral angiopathy without gangrene: Secondary | ICD-10-CM

## 2022-05-08 DIAGNOSIS — B351 Tinea unguium: Secondary | ICD-10-CM

## 2022-05-08 DIAGNOSIS — N186 End stage renal disease: Secondary | ICD-10-CM | POA: Diagnosis not present

## 2022-05-08 NOTE — Progress Notes (Signed)
  Subjective:  Patient ID: Shawn [name], male    DOB: [birth year],  MRN: [id number]  Shawn [name] presents to clinic today for at risk foot care with h/o NIDDM with ESRD on hemodialysis and painful thick toenails that are difficult to trim. Pain interferes with ambulation. Aggravating factors include wearing enclosed shoe gear. Pain is relieved with periodic professional debridement. Chief Complaint  Patient presents with   Nail Problem    DFC  BG  - 154 yesterday  A1C - 7.1 PCP - Dr Katherine Roan    New problem(s): None.   PCP is Vincente Liberty, MD.  No Known Allergies  Review of Systems: Negative except as noted in the HPI.  Objective:   Shawn [name] is a pleasant 61 y.o. male WD, WN in NAD. AAO x 3. Vascular Capillary fill time to digits <3 seconds.  DP/PT pulse(s) are nonpalpable b/l lower extremities. Pedal hair absent b/l. Lower extremity skin temperature gradient warm to warm b/l. No pain with calf compression b/l. No cyanosis or clubbing noted. No ischemia nor gangrene noted b/l.   Neurologic Protective sensation diminished with 10g monofilament b/l.  Dermatologic Pedal skin is thin, shiny and atrophic b/l.  No open wounds b/l lower extremities. No interdigital macerations b/l lower extremities. Toenails 1-5 b/l elongated, discolored, dystrophic, thickened, crumbly with subungual debris and tenderness to dorsal palpation.   Incurvated nailplate both borders of left hallux and both borders of right hallux with tenderness to palpation. No erythema, no edema, no drainage noted. No fluctuance.   Orthopedic: Normal muscle strength 5/5 to all lower extremity muscle groups bilaterally. Hammertoe(s) noted to the bilateral 2nd toes.   Assessment/Plan: 1. Pain due to onychomycosis of toenails of both feet   2. ESRD on dialysis ([place])   3. Type II diabetes mellitus with peripheral circulatory disorder (HCC)     No orders of the defined types were placed in this  encounter.   -Patient's family member present. All questions/concerns addressed on today's visit. -Examined patient. -Continue diabetic foot care principles: inspect feet daily, monitor glucose as recommended by PCP and/or Endocrinologist, and follow prescribed diet per PCP, Endocrinologist and/or dietician. -Toenails 1-5 bilaterally debrided in length and girth without iatrogenic bleeding with sterile nail nipper and dremel.  -No invasive procedure performed. Offending nail border debrided and curretaged both borders of left hallux and both borders of right hallux utilizing sterile nail nipper and currette. Border(s) cleansed with alcohol and triple antibiotic ointment applied. Patient/POA/Caregiver/Facility instructed to apply Neosporin Cream  to both nail borders of bilateral great toes once daily for 7 days. Call office if there are any concerns. -Patient/POA to call should there be question/concern in the interim.   Return in about 9 weeks (around 07/10/2022).  Marzetta Board, DPM

## 2022-05-09 DIAGNOSIS — D631 Anemia in chronic kidney disease: Secondary | ICD-10-CM | POA: Diagnosis not present

## 2022-05-09 DIAGNOSIS — I120 Hypertensive chronic kidney disease with stage 5 chronic kidney disease or end stage renal disease: Secondary | ICD-10-CM | POA: Diagnosis not present

## 2022-05-09 DIAGNOSIS — D509 Iron deficiency anemia, unspecified: Secondary | ICD-10-CM | POA: Diagnosis not present

## 2022-05-09 DIAGNOSIS — Z992 Dependence on renal dialysis: Secondary | ICD-10-CM | POA: Diagnosis not present

## 2022-05-09 DIAGNOSIS — N2581 Secondary hyperparathyroidism of renal origin: Secondary | ICD-10-CM | POA: Diagnosis not present

## 2022-05-09 DIAGNOSIS — E119 Type 2 diabetes mellitus without complications: Secondary | ICD-10-CM | POA: Diagnosis not present

## 2022-05-09 DIAGNOSIS — N186 End stage renal disease: Secondary | ICD-10-CM | POA: Diagnosis not present

## 2022-05-09 DIAGNOSIS — Z23 Encounter for immunization: Secondary | ICD-10-CM | POA: Diagnosis not present

## 2022-05-11 DIAGNOSIS — Z992 Dependence on renal dialysis: Secondary | ICD-10-CM | POA: Diagnosis not present

## 2022-05-11 DIAGNOSIS — E119 Type 2 diabetes mellitus without complications: Secondary | ICD-10-CM | POA: Diagnosis not present

## 2022-05-11 DIAGNOSIS — D631 Anemia in chronic kidney disease: Secondary | ICD-10-CM | POA: Diagnosis not present

## 2022-05-11 DIAGNOSIS — N186 End stage renal disease: Secondary | ICD-10-CM | POA: Diagnosis not present

## 2022-05-11 DIAGNOSIS — D509 Iron deficiency anemia, unspecified: Secondary | ICD-10-CM | POA: Diagnosis not present

## 2022-05-11 DIAGNOSIS — N2581 Secondary hyperparathyroidism of renal origin: Secondary | ICD-10-CM | POA: Diagnosis not present

## 2022-05-14 DIAGNOSIS — N186 End stage renal disease: Secondary | ICD-10-CM | POA: Diagnosis not present

## 2022-05-14 DIAGNOSIS — D509 Iron deficiency anemia, unspecified: Secondary | ICD-10-CM | POA: Diagnosis not present

## 2022-05-14 DIAGNOSIS — Z992 Dependence on renal dialysis: Secondary | ICD-10-CM | POA: Diagnosis not present

## 2022-05-14 DIAGNOSIS — E119 Type 2 diabetes mellitus without complications: Secondary | ICD-10-CM | POA: Diagnosis not present

## 2022-05-14 DIAGNOSIS — D631 Anemia in chronic kidney disease: Secondary | ICD-10-CM | POA: Diagnosis not present

## 2022-05-14 DIAGNOSIS — N2581 Secondary hyperparathyroidism of renal origin: Secondary | ICD-10-CM | POA: Diagnosis not present

## 2022-05-16 DIAGNOSIS — E119 Type 2 diabetes mellitus without complications: Secondary | ICD-10-CM | POA: Diagnosis not present

## 2022-05-16 DIAGNOSIS — N186 End stage renal disease: Secondary | ICD-10-CM | POA: Diagnosis not present

## 2022-05-16 DIAGNOSIS — Z992 Dependence on renal dialysis: Secondary | ICD-10-CM | POA: Diagnosis not present

## 2022-05-16 DIAGNOSIS — D509 Iron deficiency anemia, unspecified: Secondary | ICD-10-CM | POA: Diagnosis not present

## 2022-05-16 DIAGNOSIS — D631 Anemia in chronic kidney disease: Secondary | ICD-10-CM | POA: Diagnosis not present

## 2022-05-16 DIAGNOSIS — N2581 Secondary hyperparathyroidism of renal origin: Secondary | ICD-10-CM | POA: Diagnosis not present

## 2022-05-18 DIAGNOSIS — D509 Iron deficiency anemia, unspecified: Secondary | ICD-10-CM | POA: Diagnosis not present

## 2022-05-18 DIAGNOSIS — N186 End stage renal disease: Secondary | ICD-10-CM | POA: Diagnosis not present

## 2022-05-18 DIAGNOSIS — N2581 Secondary hyperparathyroidism of renal origin: Secondary | ICD-10-CM | POA: Diagnosis not present

## 2022-05-18 DIAGNOSIS — D631 Anemia in chronic kidney disease: Secondary | ICD-10-CM | POA: Diagnosis not present

## 2022-05-18 DIAGNOSIS — E119 Type 2 diabetes mellitus without complications: Secondary | ICD-10-CM | POA: Diagnosis not present

## 2022-05-18 DIAGNOSIS — Z992 Dependence on renal dialysis: Secondary | ICD-10-CM | POA: Diagnosis not present

## 2022-05-21 DIAGNOSIS — D509 Iron deficiency anemia, unspecified: Secondary | ICD-10-CM | POA: Diagnosis not present

## 2022-05-21 DIAGNOSIS — N186 End stage renal disease: Secondary | ICD-10-CM | POA: Diagnosis not present

## 2022-05-21 DIAGNOSIS — D631 Anemia in chronic kidney disease: Secondary | ICD-10-CM | POA: Diagnosis not present

## 2022-05-21 DIAGNOSIS — Z992 Dependence on renal dialysis: Secondary | ICD-10-CM | POA: Diagnosis not present

## 2022-05-21 DIAGNOSIS — N2581 Secondary hyperparathyroidism of renal origin: Secondary | ICD-10-CM | POA: Diagnosis not present

## 2022-05-21 DIAGNOSIS — E119 Type 2 diabetes mellitus without complications: Secondary | ICD-10-CM | POA: Diagnosis not present

## 2022-05-23 DIAGNOSIS — D509 Iron deficiency anemia, unspecified: Secondary | ICD-10-CM | POA: Diagnosis not present

## 2022-05-23 DIAGNOSIS — E119 Type 2 diabetes mellitus without complications: Secondary | ICD-10-CM | POA: Diagnosis not present

## 2022-05-23 DIAGNOSIS — N2581 Secondary hyperparathyroidism of renal origin: Secondary | ICD-10-CM | POA: Diagnosis not present

## 2022-05-23 DIAGNOSIS — N186 End stage renal disease: Secondary | ICD-10-CM | POA: Diagnosis not present

## 2022-05-23 DIAGNOSIS — D631 Anemia in chronic kidney disease: Secondary | ICD-10-CM | POA: Diagnosis not present

## 2022-05-23 DIAGNOSIS — Z992 Dependence on renal dialysis: Secondary | ICD-10-CM | POA: Diagnosis not present

## 2022-05-25 DIAGNOSIS — D509 Iron deficiency anemia, unspecified: Secondary | ICD-10-CM | POA: Diagnosis not present

## 2022-05-25 DIAGNOSIS — E119 Type 2 diabetes mellitus without complications: Secondary | ICD-10-CM | POA: Diagnosis not present

## 2022-05-25 DIAGNOSIS — N2581 Secondary hyperparathyroidism of renal origin: Secondary | ICD-10-CM | POA: Diagnosis not present

## 2022-05-25 DIAGNOSIS — D631 Anemia in chronic kidney disease: Secondary | ICD-10-CM | POA: Diagnosis not present

## 2022-05-25 DIAGNOSIS — Z992 Dependence on renal dialysis: Secondary | ICD-10-CM | POA: Diagnosis not present

## 2022-05-25 DIAGNOSIS — N186 End stage renal disease: Secondary | ICD-10-CM | POA: Diagnosis not present

## 2022-05-27 DIAGNOSIS — Z992 Dependence on renal dialysis: Secondary | ICD-10-CM | POA: Diagnosis not present

## 2022-05-27 DIAGNOSIS — N2581 Secondary hyperparathyroidism of renal origin: Secondary | ICD-10-CM | POA: Diagnosis not present

## 2022-05-27 DIAGNOSIS — D509 Iron deficiency anemia, unspecified: Secondary | ICD-10-CM | POA: Diagnosis not present

## 2022-05-27 DIAGNOSIS — N186 End stage renal disease: Secondary | ICD-10-CM | POA: Diagnosis not present

## 2022-05-27 DIAGNOSIS — E119 Type 2 diabetes mellitus without complications: Secondary | ICD-10-CM | POA: Diagnosis not present

## 2022-05-27 DIAGNOSIS — D631 Anemia in chronic kidney disease: Secondary | ICD-10-CM | POA: Diagnosis not present

## 2022-05-29 DIAGNOSIS — Z992 Dependence on renal dialysis: Secondary | ICD-10-CM | POA: Diagnosis not present

## 2022-05-29 DIAGNOSIS — E119 Type 2 diabetes mellitus without complications: Secondary | ICD-10-CM | POA: Diagnosis not present

## 2022-05-29 DIAGNOSIS — D631 Anemia in chronic kidney disease: Secondary | ICD-10-CM | POA: Diagnosis not present

## 2022-05-29 DIAGNOSIS — N2581 Secondary hyperparathyroidism of renal origin: Secondary | ICD-10-CM | POA: Diagnosis not present

## 2022-05-29 DIAGNOSIS — D509 Iron deficiency anemia, unspecified: Secondary | ICD-10-CM | POA: Diagnosis not present

## 2022-05-29 DIAGNOSIS — N186 End stage renal disease: Secondary | ICD-10-CM | POA: Diagnosis not present

## 2022-06-01 DIAGNOSIS — D631 Anemia in chronic kidney disease: Secondary | ICD-10-CM | POA: Diagnosis not present

## 2022-06-01 DIAGNOSIS — D509 Iron deficiency anemia, unspecified: Secondary | ICD-10-CM | POA: Diagnosis not present

## 2022-06-01 DIAGNOSIS — N2581 Secondary hyperparathyroidism of renal origin: Secondary | ICD-10-CM | POA: Diagnosis not present

## 2022-06-01 DIAGNOSIS — N186 End stage renal disease: Secondary | ICD-10-CM | POA: Diagnosis not present

## 2022-06-01 DIAGNOSIS — E119 Type 2 diabetes mellitus without complications: Secondary | ICD-10-CM | POA: Diagnosis not present

## 2022-06-01 DIAGNOSIS — Z992 Dependence on renal dialysis: Secondary | ICD-10-CM | POA: Diagnosis not present

## 2022-06-04 DIAGNOSIS — E119 Type 2 diabetes mellitus without complications: Secondary | ICD-10-CM | POA: Diagnosis not present

## 2022-06-04 DIAGNOSIS — N2581 Secondary hyperparathyroidism of renal origin: Secondary | ICD-10-CM | POA: Diagnosis not present

## 2022-06-04 DIAGNOSIS — Z992 Dependence on renal dialysis: Secondary | ICD-10-CM | POA: Diagnosis not present

## 2022-06-04 DIAGNOSIS — D509 Iron deficiency anemia, unspecified: Secondary | ICD-10-CM | POA: Diagnosis not present

## 2022-06-04 DIAGNOSIS — N186 End stage renal disease: Secondary | ICD-10-CM | POA: Diagnosis not present

## 2022-06-04 DIAGNOSIS — D631 Anemia in chronic kidney disease: Secondary | ICD-10-CM | POA: Diagnosis not present

## 2022-06-05 DIAGNOSIS — I6990 Unspecified sequelae of unspecified cerebrovascular disease: Secondary | ICD-10-CM | POA: Diagnosis not present

## 2022-06-05 DIAGNOSIS — H547 Unspecified visual loss: Secondary | ICD-10-CM | POA: Diagnosis not present

## 2022-06-05 DIAGNOSIS — Z79899 Other long term (current) drug therapy: Secondary | ICD-10-CM | POA: Diagnosis not present

## 2022-06-05 DIAGNOSIS — M1990 Unspecified osteoarthritis, unspecified site: Secondary | ICD-10-CM | POA: Diagnosis not present

## 2022-06-05 DIAGNOSIS — R55 Syncope and collapse: Secondary | ICD-10-CM | POA: Diagnosis not present

## 2022-06-05 DIAGNOSIS — E1165 Type 2 diabetes mellitus with hyperglycemia: Secondary | ICD-10-CM | POA: Diagnosis not present

## 2022-06-05 DIAGNOSIS — I999 Unspecified disorder of circulatory system: Secondary | ICD-10-CM | POA: Diagnosis not present

## 2022-06-05 DIAGNOSIS — M1220 Villonodular synovitis (pigmented), unspecified site: Secondary | ICD-10-CM | POA: Diagnosis not present

## 2022-06-05 DIAGNOSIS — E7800 Pure hypercholesterolemia, unspecified: Secondary | ICD-10-CM | POA: Diagnosis not present

## 2022-06-05 DIAGNOSIS — M12512 Traumatic arthropathy, left shoulder: Secondary | ICD-10-CM | POA: Diagnosis not present

## 2022-06-05 DIAGNOSIS — E559 Vitamin D deficiency, unspecified: Secondary | ICD-10-CM | POA: Diagnosis not present

## 2022-06-05 DIAGNOSIS — I119 Hypertensive heart disease without heart failure: Secondary | ICD-10-CM | POA: Diagnosis not present

## 2022-06-06 DIAGNOSIS — N2581 Secondary hyperparathyroidism of renal origin: Secondary | ICD-10-CM | POA: Diagnosis not present

## 2022-06-06 DIAGNOSIS — D509 Iron deficiency anemia, unspecified: Secondary | ICD-10-CM | POA: Diagnosis not present

## 2022-06-06 DIAGNOSIS — N186 End stage renal disease: Secondary | ICD-10-CM | POA: Diagnosis not present

## 2022-06-06 DIAGNOSIS — Z992 Dependence on renal dialysis: Secondary | ICD-10-CM | POA: Diagnosis not present

## 2022-06-06 DIAGNOSIS — E119 Type 2 diabetes mellitus without complications: Secondary | ICD-10-CM | POA: Diagnosis not present

## 2022-06-06 DIAGNOSIS — D631 Anemia in chronic kidney disease: Secondary | ICD-10-CM | POA: Diagnosis not present

## 2022-06-08 DIAGNOSIS — N2581 Secondary hyperparathyroidism of renal origin: Secondary | ICD-10-CM | POA: Diagnosis not present

## 2022-06-08 DIAGNOSIS — Z992 Dependence on renal dialysis: Secondary | ICD-10-CM | POA: Diagnosis not present

## 2022-06-08 DIAGNOSIS — I120 Hypertensive chronic kidney disease with stage 5 chronic kidney disease or end stage renal disease: Secondary | ICD-10-CM | POA: Diagnosis not present

## 2022-06-08 DIAGNOSIS — D509 Iron deficiency anemia, unspecified: Secondary | ICD-10-CM | POA: Diagnosis not present

## 2022-06-08 DIAGNOSIS — E119 Type 2 diabetes mellitus without complications: Secondary | ICD-10-CM | POA: Diagnosis not present

## 2022-06-08 DIAGNOSIS — D631 Anemia in chronic kidney disease: Secondary | ICD-10-CM | POA: Diagnosis not present

## 2022-06-08 DIAGNOSIS — N186 End stage renal disease: Secondary | ICD-10-CM | POA: Diagnosis not present

## 2022-06-11 DIAGNOSIS — N2581 Secondary hyperparathyroidism of renal origin: Secondary | ICD-10-CM | POA: Diagnosis not present

## 2022-06-11 DIAGNOSIS — D509 Iron deficiency anemia, unspecified: Secondary | ICD-10-CM | POA: Diagnosis not present

## 2022-06-11 DIAGNOSIS — E119 Type 2 diabetes mellitus without complications: Secondary | ICD-10-CM | POA: Diagnosis not present

## 2022-06-11 DIAGNOSIS — D631 Anemia in chronic kidney disease: Secondary | ICD-10-CM | POA: Diagnosis not present

## 2022-06-11 DIAGNOSIS — Z992 Dependence on renal dialysis: Secondary | ICD-10-CM | POA: Diagnosis not present

## 2022-06-11 DIAGNOSIS — N186 End stage renal disease: Secondary | ICD-10-CM | POA: Diagnosis not present

## 2022-06-15 DIAGNOSIS — N2581 Secondary hyperparathyroidism of renal origin: Secondary | ICD-10-CM | POA: Diagnosis not present

## 2022-06-15 DIAGNOSIS — Z992 Dependence on renal dialysis: Secondary | ICD-10-CM | POA: Diagnosis not present

## 2022-06-15 DIAGNOSIS — E119 Type 2 diabetes mellitus without complications: Secondary | ICD-10-CM | POA: Diagnosis not present

## 2022-06-15 DIAGNOSIS — D509 Iron deficiency anemia, unspecified: Secondary | ICD-10-CM | POA: Diagnosis not present

## 2022-06-15 DIAGNOSIS — D631 Anemia in chronic kidney disease: Secondary | ICD-10-CM | POA: Diagnosis not present

## 2022-06-15 DIAGNOSIS — N186 End stage renal disease: Secondary | ICD-10-CM | POA: Diagnosis not present

## 2022-06-18 DIAGNOSIS — N186 End stage renal disease: Secondary | ICD-10-CM | POA: Diagnosis not present

## 2022-06-18 DIAGNOSIS — D509 Iron deficiency anemia, unspecified: Secondary | ICD-10-CM | POA: Diagnosis not present

## 2022-06-18 DIAGNOSIS — N2581 Secondary hyperparathyroidism of renal origin: Secondary | ICD-10-CM | POA: Diagnosis not present

## 2022-06-18 DIAGNOSIS — D631 Anemia in chronic kidney disease: Secondary | ICD-10-CM | POA: Diagnosis not present

## 2022-06-18 DIAGNOSIS — E119 Type 2 diabetes mellitus without complications: Secondary | ICD-10-CM | POA: Diagnosis not present

## 2022-06-18 DIAGNOSIS — Z992 Dependence on renal dialysis: Secondary | ICD-10-CM | POA: Diagnosis not present

## 2022-06-20 DIAGNOSIS — D509 Iron deficiency anemia, unspecified: Secondary | ICD-10-CM | POA: Diagnosis not present

## 2022-06-20 DIAGNOSIS — N186 End stage renal disease: Secondary | ICD-10-CM | POA: Diagnosis not present

## 2022-06-20 DIAGNOSIS — N2581 Secondary hyperparathyroidism of renal origin: Secondary | ICD-10-CM | POA: Diagnosis not present

## 2022-06-20 DIAGNOSIS — Z992 Dependence on renal dialysis: Secondary | ICD-10-CM | POA: Diagnosis not present

## 2022-06-20 DIAGNOSIS — D631 Anemia in chronic kidney disease: Secondary | ICD-10-CM | POA: Diagnosis not present

## 2022-06-20 DIAGNOSIS — E119 Type 2 diabetes mellitus without complications: Secondary | ICD-10-CM | POA: Diagnosis not present

## 2022-06-22 DIAGNOSIS — E119 Type 2 diabetes mellitus without complications: Secondary | ICD-10-CM | POA: Diagnosis not present

## 2022-06-22 DIAGNOSIS — N186 End stage renal disease: Secondary | ICD-10-CM | POA: Diagnosis not present

## 2022-06-22 DIAGNOSIS — Z992 Dependence on renal dialysis: Secondary | ICD-10-CM | POA: Diagnosis not present

## 2022-06-22 DIAGNOSIS — D631 Anemia in chronic kidney disease: Secondary | ICD-10-CM | POA: Diagnosis not present

## 2022-06-22 DIAGNOSIS — N2581 Secondary hyperparathyroidism of renal origin: Secondary | ICD-10-CM | POA: Diagnosis not present

## 2022-06-22 DIAGNOSIS — D509 Iron deficiency anemia, unspecified: Secondary | ICD-10-CM | POA: Diagnosis not present

## 2022-06-25 DIAGNOSIS — D631 Anemia in chronic kidney disease: Secondary | ICD-10-CM | POA: Diagnosis not present

## 2022-06-25 DIAGNOSIS — D509 Iron deficiency anemia, unspecified: Secondary | ICD-10-CM | POA: Diagnosis not present

## 2022-06-25 DIAGNOSIS — Z992 Dependence on renal dialysis: Secondary | ICD-10-CM | POA: Diagnosis not present

## 2022-06-25 DIAGNOSIS — E119 Type 2 diabetes mellitus without complications: Secondary | ICD-10-CM | POA: Diagnosis not present

## 2022-06-25 DIAGNOSIS — N2581 Secondary hyperparathyroidism of renal origin: Secondary | ICD-10-CM | POA: Diagnosis not present

## 2022-06-25 DIAGNOSIS — N186 End stage renal disease: Secondary | ICD-10-CM | POA: Diagnosis not present

## 2022-06-27 DIAGNOSIS — N186 End stage renal disease: Secondary | ICD-10-CM | POA: Diagnosis not present

## 2022-06-27 DIAGNOSIS — N2581 Secondary hyperparathyroidism of renal origin: Secondary | ICD-10-CM | POA: Diagnosis not present

## 2022-06-27 DIAGNOSIS — D509 Iron deficiency anemia, unspecified: Secondary | ICD-10-CM | POA: Diagnosis not present

## 2022-06-27 DIAGNOSIS — D631 Anemia in chronic kidney disease: Secondary | ICD-10-CM | POA: Diagnosis not present

## 2022-06-27 DIAGNOSIS — E119 Type 2 diabetes mellitus without complications: Secondary | ICD-10-CM | POA: Diagnosis not present

## 2022-06-27 DIAGNOSIS — Z992 Dependence on renal dialysis: Secondary | ICD-10-CM | POA: Diagnosis not present

## 2022-06-29 DIAGNOSIS — D631 Anemia in chronic kidney disease: Secondary | ICD-10-CM | POA: Diagnosis not present

## 2022-06-29 DIAGNOSIS — E119 Type 2 diabetes mellitus without complications: Secondary | ICD-10-CM | POA: Diagnosis not present

## 2022-06-29 DIAGNOSIS — D509 Iron deficiency anemia, unspecified: Secondary | ICD-10-CM | POA: Diagnosis not present

## 2022-06-29 DIAGNOSIS — N186 End stage renal disease: Secondary | ICD-10-CM | POA: Diagnosis not present

## 2022-06-29 DIAGNOSIS — Z992 Dependence on renal dialysis: Secondary | ICD-10-CM | POA: Diagnosis not present

## 2022-06-29 DIAGNOSIS — N2581 Secondary hyperparathyroidism of renal origin: Secondary | ICD-10-CM | POA: Diagnosis not present

## 2022-07-01 DIAGNOSIS — N2581 Secondary hyperparathyroidism of renal origin: Secondary | ICD-10-CM | POA: Diagnosis not present

## 2022-07-01 DIAGNOSIS — N186 End stage renal disease: Secondary | ICD-10-CM | POA: Diagnosis not present

## 2022-07-01 DIAGNOSIS — E119 Type 2 diabetes mellitus without complications: Secondary | ICD-10-CM | POA: Diagnosis not present

## 2022-07-01 DIAGNOSIS — Z992 Dependence on renal dialysis: Secondary | ICD-10-CM | POA: Diagnosis not present

## 2022-07-01 DIAGNOSIS — D509 Iron deficiency anemia, unspecified: Secondary | ICD-10-CM | POA: Diagnosis not present

## 2022-07-01 DIAGNOSIS — D631 Anemia in chronic kidney disease: Secondary | ICD-10-CM | POA: Diagnosis not present

## 2022-07-04 DIAGNOSIS — Z992 Dependence on renal dialysis: Secondary | ICD-10-CM | POA: Diagnosis not present

## 2022-07-04 DIAGNOSIS — E119 Type 2 diabetes mellitus without complications: Secondary | ICD-10-CM | POA: Diagnosis not present

## 2022-07-04 DIAGNOSIS — N2581 Secondary hyperparathyroidism of renal origin: Secondary | ICD-10-CM | POA: Diagnosis not present

## 2022-07-04 DIAGNOSIS — N186 End stage renal disease: Secondary | ICD-10-CM | POA: Diagnosis not present

## 2022-07-04 DIAGNOSIS — D509 Iron deficiency anemia, unspecified: Secondary | ICD-10-CM | POA: Diagnosis not present

## 2022-07-04 DIAGNOSIS — D631 Anemia in chronic kidney disease: Secondary | ICD-10-CM | POA: Diagnosis not present

## 2022-07-06 DIAGNOSIS — Z992 Dependence on renal dialysis: Secondary | ICD-10-CM | POA: Diagnosis not present

## 2022-07-06 DIAGNOSIS — E119 Type 2 diabetes mellitus without complications: Secondary | ICD-10-CM | POA: Diagnosis not present

## 2022-07-06 DIAGNOSIS — N186 End stage renal disease: Secondary | ICD-10-CM | POA: Diagnosis not present

## 2022-07-06 DIAGNOSIS — D631 Anemia in chronic kidney disease: Secondary | ICD-10-CM | POA: Diagnosis not present

## 2022-07-06 DIAGNOSIS — D509 Iron deficiency anemia, unspecified: Secondary | ICD-10-CM | POA: Diagnosis not present

## 2022-07-06 DIAGNOSIS — N2581 Secondary hyperparathyroidism of renal origin: Secondary | ICD-10-CM | POA: Diagnosis not present

## 2022-07-08 DIAGNOSIS — D631 Anemia in chronic kidney disease: Secondary | ICD-10-CM | POA: Diagnosis not present

## 2022-07-08 DIAGNOSIS — N186 End stage renal disease: Secondary | ICD-10-CM | POA: Diagnosis not present

## 2022-07-08 DIAGNOSIS — Z992 Dependence on renal dialysis: Secondary | ICD-10-CM | POA: Diagnosis not present

## 2022-07-08 DIAGNOSIS — N2581 Secondary hyperparathyroidism of renal origin: Secondary | ICD-10-CM | POA: Diagnosis not present

## 2022-07-08 DIAGNOSIS — D509 Iron deficiency anemia, unspecified: Secondary | ICD-10-CM | POA: Diagnosis not present

## 2022-07-08 DIAGNOSIS — E119 Type 2 diabetes mellitus without complications: Secondary | ICD-10-CM | POA: Diagnosis not present

## 2022-07-09 DIAGNOSIS — I120 Hypertensive chronic kidney disease with stage 5 chronic kidney disease or end stage renal disease: Secondary | ICD-10-CM | POA: Diagnosis not present

## 2022-07-09 DIAGNOSIS — Z992 Dependence on renal dialysis: Secondary | ICD-10-CM | POA: Diagnosis not present

## 2022-07-09 DIAGNOSIS — N186 End stage renal disease: Secondary | ICD-10-CM | POA: Diagnosis not present

## 2022-07-11 DIAGNOSIS — D631 Anemia in chronic kidney disease: Secondary | ICD-10-CM | POA: Diagnosis not present

## 2022-07-11 DIAGNOSIS — N2581 Secondary hyperparathyroidism of renal origin: Secondary | ICD-10-CM | POA: Diagnosis not present

## 2022-07-11 DIAGNOSIS — Z992 Dependence on renal dialysis: Secondary | ICD-10-CM | POA: Diagnosis not present

## 2022-07-11 DIAGNOSIS — N186 End stage renal disease: Secondary | ICD-10-CM | POA: Diagnosis not present

## 2022-07-13 DIAGNOSIS — D631 Anemia in chronic kidney disease: Secondary | ICD-10-CM | POA: Diagnosis not present

## 2022-07-13 DIAGNOSIS — Z992 Dependence on renal dialysis: Secondary | ICD-10-CM | POA: Diagnosis not present

## 2022-07-13 DIAGNOSIS — N186 End stage renal disease: Secondary | ICD-10-CM | POA: Diagnosis not present

## 2022-07-13 DIAGNOSIS — N2581 Secondary hyperparathyroidism of renal origin: Secondary | ICD-10-CM | POA: Diagnosis not present

## 2022-07-16 DIAGNOSIS — D631 Anemia in chronic kidney disease: Secondary | ICD-10-CM | POA: Diagnosis not present

## 2022-07-16 DIAGNOSIS — Z992 Dependence on renal dialysis: Secondary | ICD-10-CM | POA: Diagnosis not present

## 2022-07-16 DIAGNOSIS — N186 End stage renal disease: Secondary | ICD-10-CM | POA: Diagnosis not present

## 2022-07-16 DIAGNOSIS — N2581 Secondary hyperparathyroidism of renal origin: Secondary | ICD-10-CM | POA: Diagnosis not present

## 2022-07-17 ENCOUNTER — Ambulatory Visit (INDEPENDENT_AMBULATORY_CARE_PROVIDER_SITE_OTHER): Payer: Medicare Other | Admitting: Podiatry

## 2022-07-17 ENCOUNTER — Encounter: Payer: Self-pay | Admitting: Podiatry

## 2022-07-17 ENCOUNTER — Ambulatory Visit (HOSPITAL_BASED_OUTPATIENT_CLINIC_OR_DEPARTMENT_OTHER): Payer: Medicare Other | Admitting: Cardiology

## 2022-07-17 VITALS — BP 171/80

## 2022-07-17 DIAGNOSIS — B351 Tinea unguium: Secondary | ICD-10-CM | POA: Diagnosis not present

## 2022-07-17 DIAGNOSIS — N186 End stage renal disease: Secondary | ICD-10-CM

## 2022-07-17 DIAGNOSIS — Z992 Dependence on renal dialysis: Secondary | ICD-10-CM | POA: Diagnosis not present

## 2022-07-17 DIAGNOSIS — M79674 Pain in right toe(s): Secondary | ICD-10-CM | POA: Diagnosis not present

## 2022-07-17 DIAGNOSIS — E1151 Type 2 diabetes mellitus with diabetic peripheral angiopathy without gangrene: Secondary | ICD-10-CM | POA: Diagnosis not present

## 2022-07-17 DIAGNOSIS — M79675 Pain in left toe(s): Secondary | ICD-10-CM | POA: Diagnosis not present

## 2022-07-17 NOTE — Progress Notes (Signed)
  Subjective:  Patient ID: Shawn [name], male    DOB: [birth year],  MRN: [id number]  Shawn [name] presents to clinic today for at risk foot care. Pt has h/o NIDDM with chronic kidney disease and painful elongated mycotic toenails 1-5 bilaterally which are tender when wearing enclosed shoe gear. Pain is relieved with periodic professional debridement.  Chief Complaint  Patient presents with   Nail Problem    [facility] BS- did not check today A1C-7.6 PCP-[place], Iona Beard PCP VST-06/2022   New problem(s): None.   His wife is present during today's visit.  PCP is Vincente Liberty, MD.  No Known Allergies  Review of Systems: Negative except as noted in the HPI.  Objective: No changes noted in today's physical examination.  Vitals:   07/17/22 0826  BP: (!) 173/82   Shawn [name] is a pleasant 62 y.o. male WD, WN in NAD. AAO x 3. Vascular Capillary fill time to digits <3 seconds.  DP/PT pulse(s) are nonpalpable b/l lower extremities. Pedal hair absent b/l. Lower extremity skin temperature gradient warm to warm b/l. No pain with calf compression b/l. No cyanosis or clubbing noted. No ischemia nor gangrene noted b/l.   Neurologic Protective sensation diminished with 10g monofilament b/l.  Dermatologic Pedal skin is thin, shiny and atrophic b/l.  No open wounds b/l lower extremities. No interdigital macerations b/l lower extremities. Toenails 1-5 b/l elongated, discolored, dystrophic, thickened, crumbly with subungual debris and tenderness to dorsal palpation.   Incurvated nailplate both borders of left hallux and both borders of right hallux with tenderness to palpation. No erythema, no edema, no drainage noted. No fluctuance.   Orthopedic: Normal muscle strength 5/5 to all lower extremity muscle groups bilaterally. Hammertoe(s) noted to the bilateral 2nd toes.   Assessment/Plan: 1. Pain due to onychomycosis of toenails of both feet   2. ESRD on dialysis ([place])   3.  Type II diabetes mellitus with peripheral circulatory disorder [facility])      -Patient was evaluated and treated. All patient's and/or POA's questions/concerns answered on today's visit. -Continue foot and shoe inspections daily. Monitor blood glucose per PCP/Endocrinologist's recommendations. -Patient to continue soft, supportive shoe gear daily. -Toenails 1-5 b/l were debrided in length and girth with sterile nail nippers and dremel without iatrogenic bleeding.  -Patient/POA to call should there be question/concern in the interim.   Return in about 10 weeks (around 09/25/2022).  Marzetta Board, DPM

## 2022-07-18 DIAGNOSIS — N186 End stage renal disease: Secondary | ICD-10-CM | POA: Diagnosis not present

## 2022-07-18 DIAGNOSIS — D631 Anemia in chronic kidney disease: Secondary | ICD-10-CM | POA: Diagnosis not present

## 2022-07-18 DIAGNOSIS — Z992 Dependence on renal dialysis: Secondary | ICD-10-CM | POA: Diagnosis not present

## 2022-07-18 DIAGNOSIS — N2581 Secondary hyperparathyroidism of renal origin: Secondary | ICD-10-CM | POA: Diagnosis not present

## 2022-07-20 DIAGNOSIS — D631 Anemia in chronic kidney disease: Secondary | ICD-10-CM | POA: Diagnosis not present

## 2022-07-20 DIAGNOSIS — Z992 Dependence on renal dialysis: Secondary | ICD-10-CM | POA: Diagnosis not present

## 2022-07-20 DIAGNOSIS — N2581 Secondary hyperparathyroidism of renal origin: Secondary | ICD-10-CM | POA: Diagnosis not present

## 2022-07-20 DIAGNOSIS — N186 End stage renal disease: Secondary | ICD-10-CM | POA: Diagnosis not present

## 2022-07-23 DIAGNOSIS — Z992 Dependence on renal dialysis: Secondary | ICD-10-CM | POA: Diagnosis not present

## 2022-07-23 DIAGNOSIS — D631 Anemia in chronic kidney disease: Secondary | ICD-10-CM | POA: Diagnosis not present

## 2022-07-23 DIAGNOSIS — N2581 Secondary hyperparathyroidism of renal origin: Secondary | ICD-10-CM | POA: Diagnosis not present

## 2022-07-23 DIAGNOSIS — N186 End stage renal disease: Secondary | ICD-10-CM | POA: Diagnosis not present

## 2022-07-23 IMAGING — DX DG SHOULDER 2+V*L*
3 series · 3 of 3 positions shown · non-contrast
Comparison: None.

CLINICAL DATA: Left shoulder pain, decreased range of motion

EXAM:
LEFT SHOULDER - 2+ VIEW

[dg shoulder left (1 of 3)]
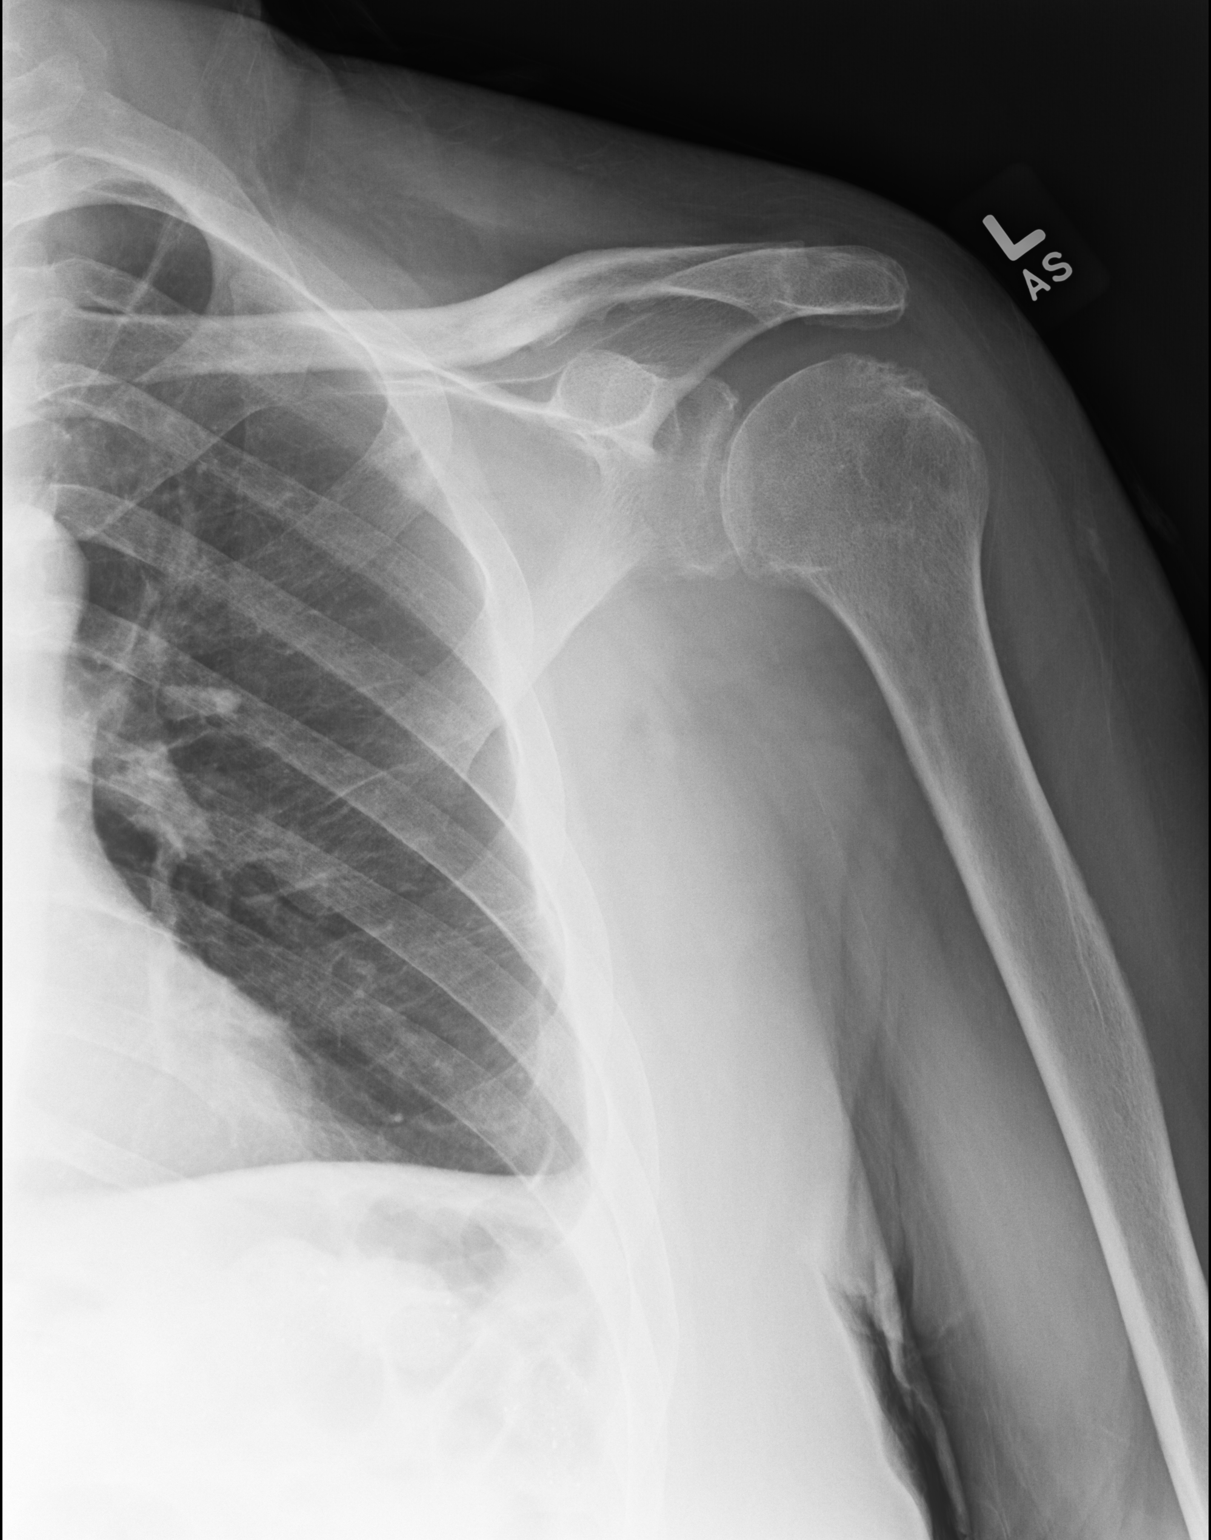

[dg shoulder left (2 of 3)]
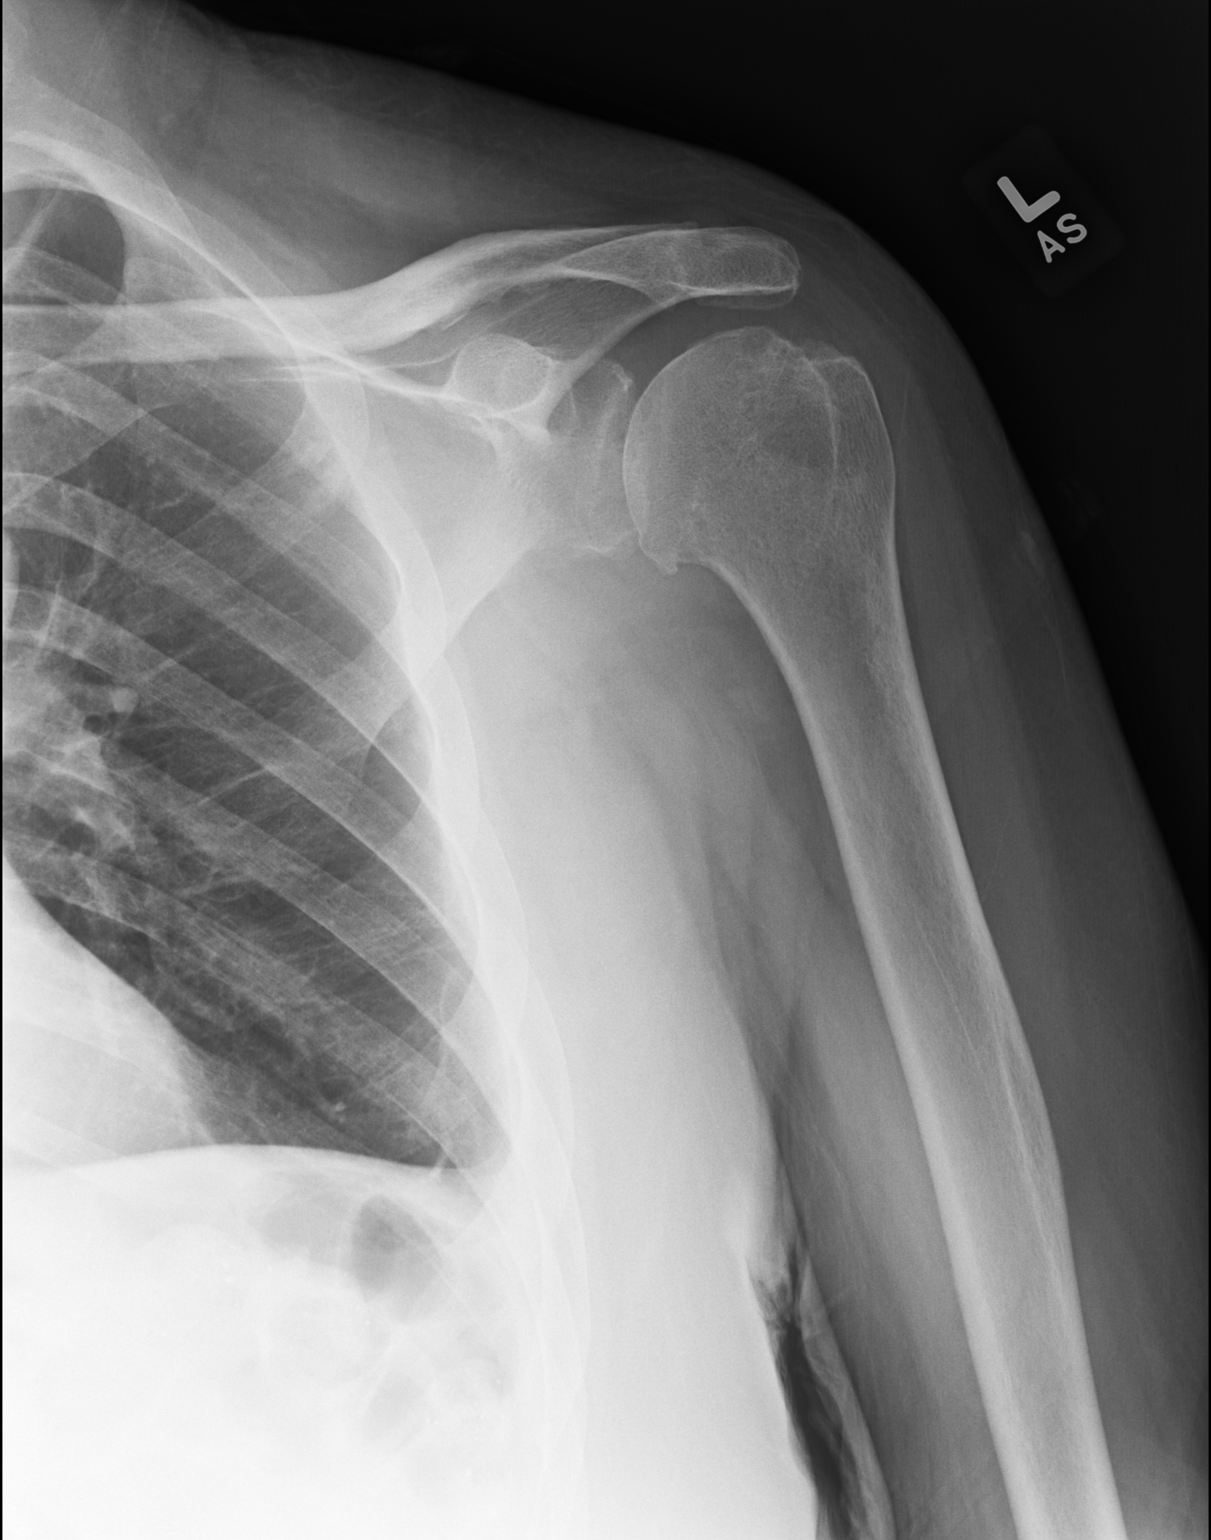

[dg shoulder left (3 of 3)]
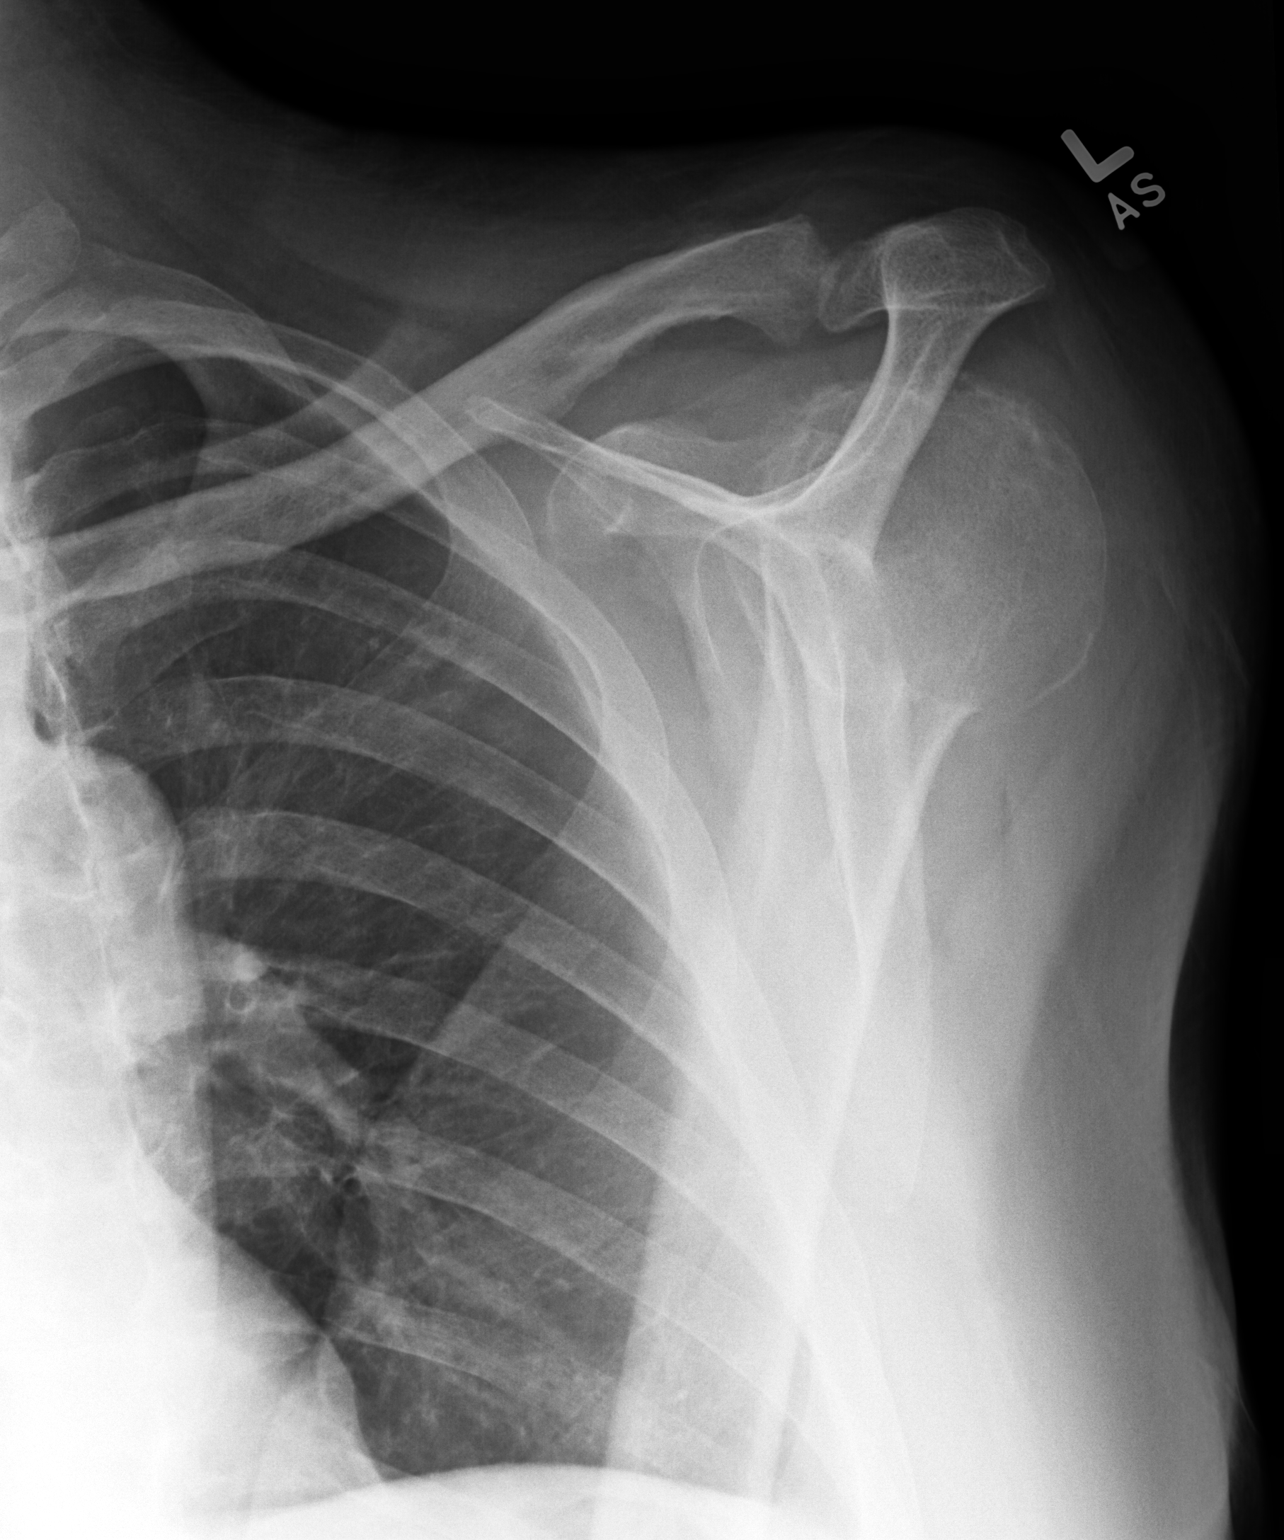

[3 of 3 positions shown; findings below may reference images not displayed]

FINDINGS: Internal rotation, external rotation, transscapular views are
obtained. No fracture, subluxation, or dislocation. Moderate
glenohumeral osteoarthritis. Mild hypertrophic changes of the
acromioclavicular joint. Trace left pleural effusion versus pleural
thickening. Remaining visualized portions of the left chest are
clear.
IMPRESSION: 1. Moderate glenohumeral osteoarthritis.
2. Mild acromioclavicular osteoarthritis.
3. No acute bony abnormality.

## 2022-07-24 ENCOUNTER — Encounter (HOSPITAL_BASED_OUTPATIENT_CLINIC_OR_DEPARTMENT_OTHER): Payer: Self-pay | Admitting: Cardiology

## 2022-07-24 ENCOUNTER — Ambulatory Visit (INDEPENDENT_AMBULATORY_CARE_PROVIDER_SITE_OTHER): Payer: Medicare Other | Admitting: Cardiology

## 2022-07-24 VITALS — BP 124/70 | HR 74 | Ht 71.0 in | Wt 190.1 lb

## 2022-07-24 DIAGNOSIS — E118 Type 2 diabetes mellitus with unspecified complications: Secondary | ICD-10-CM

## 2022-07-24 DIAGNOSIS — Z992 Dependence on renal dialysis: Secondary | ICD-10-CM

## 2022-07-24 DIAGNOSIS — N186 End stage renal disease: Secondary | ICD-10-CM

## 2022-07-24 DIAGNOSIS — E782 Mixed hyperlipidemia: Secondary | ICD-10-CM

## 2022-07-24 DIAGNOSIS — Z8673 Personal history of transient ischemic attack (TIA), and cerebral infarction without residual deficits: Secondary | ICD-10-CM | POA: Diagnosis not present

## 2022-07-24 LAB — LIPID PANEL
Chol/HDL Ratio: 2.7 ratio (ref 0.0–5.0)
Cholesterol, Total: 131 mg/dL (ref 100–199)
HDL: 49 mg/dL (ref >39)
LDL Chol Calc (NIH): 70 mg/dL (ref 0–99)
Triglycerides: 56 mg/dL (ref 0–149)
VLDL Cholesterol Cal: 12 mg/dL (ref 5–40)

## 2022-07-24 NOTE — Progress Notes (Signed)
Cardiology Office Note   Date:  07/24/2022   ID:  Shawn [name], DOB Jan 03, 1961, MRN [id number]  PCP:  Vincente Liberty, MD  Cardiologist:  Buford Dresser, MD  Referring MD: Vincente Liberty, MD   CC: follow up  History of Present Illness:    Shawn [name] is a 62 y.o. male with a hx of end stage renal disease on dialysis, CVA, hypertension, diabetes, legal blindness.  At his last appointment he reported LUE soreness from left shoulder to elbow; not to have a fistula on that side (not in use) and a history of steal syndrome. No recent syncope reported with dialysis. Midodrine titrated based on his starting blood pressure. Had his dry weight raised which seemed to be helping. Discussed that from a CV standpoint, ok to drop to 81 mg aspirin. He was taking the 325 mg dose about every other day and was comfortable continuing that regimen.  Today, he is accompanied by a family member. He states that he is doing well. His main complaint today is fatigue and heavy breathing when walking upstairs or walking a long distance. However this seems to be stable. For exercise he does try to walk. If he walks for very long he notices his breathing becomes tight.  At home his blood pressures have been running high, such as 170-180/91. His reading in clinic today 124/70 is surprising to him. He reports that amlodipine was ineffective. Yesterday he reports being started on lisinopril by Dr. Joylene Grapes, his nephrologist. I cannot see this on outside med list, will confirm. Currently he is not on midodrine. Lately he denies any issues with syncope or blacking out. His dry weight is 34.5; he complains of muscle cramps following dialysis.   His Crestor was changed to generic rosuvastatin a couple months ago.  He denies any palpitations, chest pain, or peripheral edema. No lightheadedness, headaches, orthopnea, or PND.    Past Medical History:  Diagnosis Date   Anemia    Arthritis     patient denies   Blind right eye    Chronic kidney disease    Dialysis since 7829 MWF   Complication of anesthesia    Diabetes mellitus    type II   Hypertension    Legally blind    No pertinent past medical history    PONV (postoperative nausea and vomiting)    N/V- became dehydrated had to have IV fluids- 01/2015   Shortness of breath dyspnea    when has too fluid   Stroke [facility]) 2012   date per patient   Syncope    Unspecified cerebral artery occlusion with cerebral infarction 04/02/2013   Past Surgical History:  Procedure Laterality Date   AV FISTULA PLACEMENT Right 05/12/2013   Procedure: INSERTION OF ARTERIOVENOUS (AV) GORE-TEX GRAFT ARM; ULTRASOUND GUIDED;  Surgeon: Conrad [place], MD;  Location: [facility];  Service: Vascular;  Laterality: Right;   AV FISTULA PLACEMENT Left 01/18/2015   Procedure: INSERTION OF ARTERIOVENOUS GORE-TEX GRAFT LEFT UPPER ARM;  Surgeon: Conrad Summerdale, MD;  Location: [facility];  Service: Vascular;  Laterality: Left;   COLONOSCOPY  06/2012   EYE SURGERY Bilateral    retina surgery both eyes, cataract surgery both eyes   HERNIA REPAIR     right inguinal   INSERTION OF DIALYSIS CATHETER Right    left arm graft  10/2010   LIGATION ARTERIOVENOUS GORTEX GRAFT Left 05/03/2015   Procedure: LIGATION ARTERIOVENOUS GORTEX GRAFT-LEFT UPPER ARM;  Surgeon: Conrad Jeddo, MD;  Location:  [facility] OR;  Service: Vascular;  Laterality: Left;     Current Meds  Medication Sig   amLODipine (NORVASC) 5 MG tablet Take by mouth.   aspirin EC 325 MG tablet Take 325 mg by mouth at bedtime.   b complex vitamins tablet Take 1 tablet by mouth at bedtime.   brimonidine (ALPHAGAN) 0.15 % ophthalmic solution    brimonidine (ALPHAGAN) 0.2 % ophthalmic solution 1 drop 2 (two) times daily.   calcitRIOL (ROCALTROL) 0.5 MCG capsule Take 1 capsule (0.5 mcg total) by mouth 3 (three) times a week.   diphenhydramine-acetaminophen (TYLENOL PM) 25-500 MG TABS tablet Take 1 tablet by mouth at bedtime as  needed (pain).   dorzolamide-timolol (COSOPT) 22.3-6.8 MG/ML ophthalmic solution Place 1 drop into both eyes 2 (two) times daily.   Doxercalciferol (HECTOROL IV) Doxercalciferol (Hectorol)   DROPLET PEN NEEDLES 31G X 6 MM MISC    erythromycin ophthalmic ointment Apply to eye as needed.   hydrOXYzine (ATARAX/VISTARIL) 10 MG tablet Take 10 mg by mouth daily as needed for itching.    lanthanum (FOSRENOL) 1000 MG chewable tablet Chew 1,000 mg by mouth 3 (three) times daily with meals.   latanoprost (XALATAN) 0.005 % ophthalmic solution Place 1 drop into both eyes at bedtime.    LEVEMIR FLEXTOUCH 100 UNIT/ML FlexPen Inject into the skin 2 (two) times daily.   lisinopril (ZESTRIL) 5 MG tablet Take 5 mg by mouth daily.   rosuvastatin (CRESTOR) 40 MG tablet Take 40 mg by mouth daily.   VELPHORO 500 MG chewable tablet Chew 1,000 mg by mouth 3 (three) times daily.   [DISCONTINUED] midodrine (PROAMATINE) 10 MG tablet Mon, Wed, Fri     Allergies:   Patient has no known allergies.   Social History   Tobacco Use   Smoking status: Never   Smokeless tobacco: Never  Vaping Use   Vaping Use: Never used  Substance Use Topics   Alcohol use: No    Alcohol/week: 0.0 standard drinks of alcohol   Drug use: No     Family Hx: The patient's family history includes Alzheimer's disease in his mother; Cancer in his father and sister; Diabetes in his mother; Heart disease in his father; Stroke in his sister.  ROS:   Please see the history of present illness. (+) Exertional fatigue (+) Shortness of breath (+) Myalgias All other systems are reviewed and negative.    Prior CV studies:   The following studies were reviewed today:  CT Chest  08/03/2019: IMPRESSION: 1. Since the prior CT of 05/28/2019, decrease in bilateral pleural effusions and improved bibasilar atelectasis. 2.  No acute superimposed process. 3.  Aortic Atherosclerosis (ICD10-I70.0). 4. Cholelithiasis. 5. Improved mediastinal  adenopathy, favored to be related to improved fluid overload.  Lexiscan MPI 01/22/18 There was no ST segment deviation noted during stress. The left ventricular ejection fraction is mildly decreased (45-54%). Nuclear stress EF: 51%, there is apicalseptal hypokinesis. This is a low risk study. With no significant ischemia or perfusion defects identified, however ejection fraction is mildly reduced with apical septal hypokinesis.   Monitor 8.8.19 14 day monitor reviewed. Average heart rate 63 bpm. No tachycardia. 25% of usable tracings were sinus bradycardia, 75% normal sinus rhythm. No pauses. 6 patient triggered events, including one for chest pain. All were sinus rhythm. No significant abnormalities noted on monitor.   Echo from 12/22/17 personally reviewed, agree with report: Left ventricle: The cavity size was normal. There was mild   concentric hypertrophy. Systolic function was mildly  reduced. The   estimated ejection fraction was in the range of 45% to 50%. Mild   diffuse hypokinesis. Doppler parameters are consistent with   abnormal left ventricular relaxation (grade 1 diastolic   dysfunction). Doppler parameters are consistent with high   ventricular filling pressure. - Aortic valve: Transvalvular velocity was within the normal range.   There was no stenosis. There was no regurgitation. Valve area   (VTI): 2.17 cm^2. Valve area (Vmax): 2.16 cm^2. Valve area   (Vmean): 2.14 cm^2. - Mitral valve: Transvalvular velocity was within the normal range.   There was no evidence for stenosis. There was trivial   regurgitation. - Left atrium: The atrium was mildly dilated. - Right ventricle: The cavity size was normal. Wall thickness was   normal. Systolic function was normal. - Tricuspid valve: There was trivial regurgitation. - Pulmonary arteries: Systolic pressure was within the normal   range. PA peak pressure: 35 mm Hg (S).   VAS US CAROTID DUPLEX BILATERAL 04/05/2017: Summary:    - The vertebral arteries appear patent with antegrade flow.  - Findings consistent with 1- 39 percent stenosis involving the    right internal carotid artery and the left internal carotid    artery.    Labs/Other Tests and Data Reviewed:    EKG:  EKG is personally reviewed and interpreted.  07/24/2022:  SR at 74 bpm, nonspecific t wave pattern 04/20/20: Sinus Rhythm. Rate 66 bpm.  Recent Labs: No results found for requested labs within last 365 days.   Recent Lipid Panel Lab Results  Component Value Date/Time   CHOL (H) 01/26/2010 05:20 AM    207        ATP III CLASSIFICATION:  <200     mg/dL   Desirable  200-239  mg/dL   Borderline High  >=240    mg/dL   High          TRIG 115 01/26/2010 05:20 AM   HDL 39 (L) 01/26/2010 05:20 AM   CHOLHDL 5.3 01/26/2010 05:20 AM   LDLCALC (H) 01/26/2010 05:20 AM    145        Total Cholesterol/HDL:CHD Risk Coronary Heart Disease Risk Table                     Men   Women  1/2 Average Risk   3.4   3.3  Average Risk       5.0   4.4  2 X Average Risk   9.6   7.1  3 X Average Risk  23.4   11.0        Use the calculated Patient Ratio above and the CHD Risk Table to determine the patient's CHD Risk.        ATP III CLASSIFICATION (LDL):  <100     mg/dL   Optimal  100-129  mg/dL   Near or Above                    Optimal  130-159  mg/dL   Borderline  160-189  mg/dL   High  >190     mg/dL   Very High    Wt Readings from Last 3 Encounters:  07/24/22 190 lb 1.6 oz (86.2 kg)  06/29/21 172 lb (78 kg)  06/14/20 177 lb (80.3 kg)     Objective:    Vital Signs:  BP 124/70 (BP Location: Left Arm, Patient Position: Sitting, Cuff Size: Normal)   Pulse 74   Ht  5\' 11"  (1.803 m)   Wt 190 lb 1.6 oz (86.2 kg)   BMI 26.51 kg/m    GEN: Well nourished, well developed in no acute distress HEENT: Normal, moist mucous membranes NECK: No JVD CARDIAC: regular rhythm, normal S1 and S2, no rubs or gallops. No murmur. VASCULAR: Radial and DP  pulses 2+ bilaterally. No carotid bruits RESPIRATORY:  Clear to auscultation without rales, wheezing or rhonchi  ABDOMEN: Soft, non-tender, non-distended MUSCULOSKELETAL:  Ambulates independently SKIN: Warm and dry, no edema NEUROLOGIC:  Alert and oriented x 3. No focal neuro deficits noted. PSYCHIATRIC:  Normal affect    ASSESSMENT & PLAN:    1. Diabetes mellitus with complication ([place])   2. Mixed hyperlipidemia   3. ESRD (end stage renal disease) on dialysis ([place])   4. History of CVA (cerebrovascular accident)      History of CVA: no further events -discussed than from a CV standpoint, ok to drop to 81 mg aspirin. He takes the 325 mg dose about every other day and is comfortable continuing that regimen, -no active bleeding  ESRD on dialysis, complicated by hypertension and type II diabetes: -has had issue with both hypotension requiring midodrine and hypertension -reports recently having his medication changed, notes that he was started on lisinopril yesterday by Dr. Joylene Grapes. Will confirm this given his ESRD -management of ESRD/HTN by nephrology -diabetes management per PCP/endo  Hyperlipidemia:   -continue atorvastatin 40 mg -Ideally would like LDL <70, but there is unclear data for targets in ESRD.  -check lipids today, only ate a bite of a bagel this AM  Palpitations: infrequent, manageable -no high risk features   Cardiovascular risk factors and prevention -discussed lifestyle recommendations. Exercise ability limited given legal blindness. On renal diet.  Follow Up:   12 mos or sooner as needed   Medication Adjustments/Labs and Tests Ordered: Current medicines are reviewed at length with the patient today.  Concerns regarding medicines are outlined above.   Orders Placed This Encounter  Procedures   Lipid panel   EKG 12-Lead   No orders of the defined types were placed in this encounter.  Patient Instructions  Medication Instructions:  Your Physician  recommend you continue on your current medication as directed.  ]  *If you need a refill on your cardiac medications before your next appointment, please call your pharmacy*   Lab Work: Your physician recommends that you return for lab work today- Lipid Panel  If you have labs (blood work) drawn today and your tests are completely normal, you will receive your results only by: MyChart Message (if you have [place]) OR A paper copy in the mail If you have any lab test that is abnormal or we need to change your treatment, we will call you to review the results.  Follow-Up: At [facility], you and your health needs are our priority.  As part of our continuing mission to provide you with exceptional heart care, we have created designated Provider Care Teams.  These Care Teams include your primary Cardiologist (physician) and Advanced Practice Providers (APPs -  Physician Assistants and Nurse Practitioners) who all work together to provide you with the care you need, when you need it.  We recommend signing up for the patient portal called "MyChart".  Sign up information is provided on this After Visit Summary.  MyChart is used to connect with patients for Virtual Visits (Telemedicine).  Patients are able to view lab/test results, encounter notes, upcoming appointments, etc.  Non-urgent messages  can be sent to your provider as well.   To learn more about what you can do with MyChart, go to [email or web address].    Your next appointment:   1 year(s)  Provider:   Buford Dresser, MD     [facility],acting as a scribe for Buford Dresser, MD.,have documented all relevant documentation on the behalf of Buford Dresser, MD,as directed by  Buford Dresser, MD while in the presence of Buford Dresser, MD.  I, Buford Dresser, MD, have reviewed all documentation for this visit. The documentation on 07/24/22 for the exam, diagnosis, procedures, and  orders are all accurate and complete.   Signed, Buford Dresser, MD  07/24/2022 8:50 AM    [place]

## 2022-07-24 NOTE — Patient Instructions (Signed)
Medication Instructions:  Your Physician recommend you continue on your current medication as directed.  ]  *If you need a refill on your cardiac medications before your next appointment, please call your pharmacy*   Lab Work: Your physician recommends that you return for lab work today- Lipid Panel  If you have labs (blood work) drawn today and your tests are completely normal, you will receive your results only by: MyChart Message (if you have [place]) OR A paper copy in the mail If you have any lab test that is abnormal or we need to change your treatment, we will call you to review the results.  Follow-Up: At [facility], you and your health needs are our priority.  As part of our continuing mission to provide you with exceptional heart care, we have created designated Provider Care Teams.  These Care Teams include your primary Cardiologist (physician) and Advanced Practice Providers (APPs -  Physician Assistants and Nurse Practitioners) who all work together to provide you with the care you need, when you need it.  We recommend signing up for the patient portal called "MyChart".  Sign up information is provided on this After Visit Summary.  MyChart is used to connect with patients for Virtual Visits (Telemedicine).  Patients are able to view lab/test results, encounter notes, upcoming appointments, etc.  Non-urgent messages can be sent to your provider as well.   To learn more about what you can do with MyChart, go to [email or web address].    Your next appointment:   1 year(s)  Provider:   Buford Dresser, MD

## 2022-07-25 DIAGNOSIS — D631 Anemia in chronic kidney disease: Secondary | ICD-10-CM | POA: Diagnosis not present

## 2022-07-25 DIAGNOSIS — N186 End stage renal disease: Secondary | ICD-10-CM | POA: Diagnosis not present

## 2022-07-25 DIAGNOSIS — N2581 Secondary hyperparathyroidism of renal origin: Secondary | ICD-10-CM | POA: Diagnosis not present

## 2022-07-25 DIAGNOSIS — Z992 Dependence on renal dialysis: Secondary | ICD-10-CM | POA: Diagnosis not present

## 2022-07-27 DIAGNOSIS — N186 End stage renal disease: Secondary | ICD-10-CM | POA: Diagnosis not present

## 2022-07-27 DIAGNOSIS — D631 Anemia in chronic kidney disease: Secondary | ICD-10-CM | POA: Diagnosis not present

## 2022-07-27 DIAGNOSIS — Z992 Dependence on renal dialysis: Secondary | ICD-10-CM | POA: Diagnosis not present

## 2022-07-27 DIAGNOSIS — N2581 Secondary hyperparathyroidism of renal origin: Secondary | ICD-10-CM | POA: Diagnosis not present

## 2022-07-30 DIAGNOSIS — N186 End stage renal disease: Secondary | ICD-10-CM | POA: Diagnosis not present

## 2022-07-30 DIAGNOSIS — D631 Anemia in chronic kidney disease: Secondary | ICD-10-CM | POA: Diagnosis not present

## 2022-07-30 DIAGNOSIS — Z992 Dependence on renal dialysis: Secondary | ICD-10-CM | POA: Diagnosis not present

## 2022-07-30 DIAGNOSIS — N2581 Secondary hyperparathyroidism of renal origin: Secondary | ICD-10-CM | POA: Diagnosis not present

## 2022-08-01 DIAGNOSIS — N2581 Secondary hyperparathyroidism of renal origin: Secondary | ICD-10-CM | POA: Diagnosis not present

## 2022-08-01 DIAGNOSIS — D631 Anemia in chronic kidney disease: Secondary | ICD-10-CM | POA: Diagnosis not present

## 2022-08-01 DIAGNOSIS — Z992 Dependence on renal dialysis: Secondary | ICD-10-CM | POA: Diagnosis not present

## 2022-08-01 DIAGNOSIS — N186 End stage renal disease: Secondary | ICD-10-CM | POA: Diagnosis not present

## 2022-08-03 DIAGNOSIS — N2581 Secondary hyperparathyroidism of renal origin: Secondary | ICD-10-CM | POA: Diagnosis not present

## 2022-08-03 DIAGNOSIS — Z992 Dependence on renal dialysis: Secondary | ICD-10-CM | POA: Diagnosis not present

## 2022-08-03 DIAGNOSIS — D631 Anemia in chronic kidney disease: Secondary | ICD-10-CM | POA: Diagnosis not present

## 2022-08-03 DIAGNOSIS — N186 End stage renal disease: Secondary | ICD-10-CM | POA: Diagnosis not present

## 2022-08-06 DIAGNOSIS — N186 End stage renal disease: Secondary | ICD-10-CM | POA: Diagnosis not present

## 2022-08-06 DIAGNOSIS — N2581 Secondary hyperparathyroidism of renal origin: Secondary | ICD-10-CM | POA: Diagnosis not present

## 2022-08-06 DIAGNOSIS — Z992 Dependence on renal dialysis: Secondary | ICD-10-CM | POA: Diagnosis not present

## 2022-08-06 DIAGNOSIS — D631 Anemia in chronic kidney disease: Secondary | ICD-10-CM | POA: Diagnosis not present

## 2022-08-08 DIAGNOSIS — N186 End stage renal disease: Secondary | ICD-10-CM | POA: Diagnosis not present

## 2022-08-08 DIAGNOSIS — N2581 Secondary hyperparathyroidism of renal origin: Secondary | ICD-10-CM | POA: Diagnosis not present

## 2022-08-08 DIAGNOSIS — Z992 Dependence on renal dialysis: Secondary | ICD-10-CM | POA: Diagnosis not present

## 2022-08-08 DIAGNOSIS — D631 Anemia in chronic kidney disease: Secondary | ICD-10-CM | POA: Diagnosis not present

## 2022-08-09 DIAGNOSIS — D509 Iron deficiency anemia, unspecified: Secondary | ICD-10-CM | POA: Diagnosis not present

## 2022-08-09 DIAGNOSIS — N2581 Secondary hyperparathyroidism of renal origin: Secondary | ICD-10-CM | POA: Diagnosis not present

## 2022-08-09 DIAGNOSIS — N186 End stage renal disease: Secondary | ICD-10-CM | POA: Diagnosis not present

## 2022-08-09 DIAGNOSIS — D631 Anemia in chronic kidney disease: Secondary | ICD-10-CM | POA: Diagnosis not present

## 2022-08-09 DIAGNOSIS — I120 Hypertensive chronic kidney disease with stage 5 chronic kidney disease or end stage renal disease: Secondary | ICD-10-CM | POA: Diagnosis not present

## 2022-08-09 DIAGNOSIS — E119 Type 2 diabetes mellitus without complications: Secondary | ICD-10-CM | POA: Diagnosis not present

## 2022-08-09 DIAGNOSIS — Z992 Dependence on renal dialysis: Secondary | ICD-10-CM | POA: Diagnosis not present

## 2022-08-13 DIAGNOSIS — N186 End stage renal disease: Secondary | ICD-10-CM | POA: Diagnosis not present

## 2022-08-13 DIAGNOSIS — D631 Anemia in chronic kidney disease: Secondary | ICD-10-CM | POA: Diagnosis not present

## 2022-08-13 DIAGNOSIS — D509 Iron deficiency anemia, unspecified: Secondary | ICD-10-CM | POA: Diagnosis not present

## 2022-08-13 DIAGNOSIS — N2581 Secondary hyperparathyroidism of renal origin: Secondary | ICD-10-CM | POA: Diagnosis not present

## 2022-08-13 DIAGNOSIS — E119 Type 2 diabetes mellitus without complications: Secondary | ICD-10-CM | POA: Diagnosis not present

## 2022-08-13 DIAGNOSIS — Z992 Dependence on renal dialysis: Secondary | ICD-10-CM | POA: Diagnosis not present

## 2022-08-15 DIAGNOSIS — E119 Type 2 diabetes mellitus without complications: Secondary | ICD-10-CM | POA: Diagnosis not present

## 2022-08-15 DIAGNOSIS — N186 End stage renal disease: Secondary | ICD-10-CM | POA: Diagnosis not present

## 2022-08-15 DIAGNOSIS — Z992 Dependence on renal dialysis: Secondary | ICD-10-CM | POA: Diagnosis not present

## 2022-08-15 DIAGNOSIS — D631 Anemia in chronic kidney disease: Secondary | ICD-10-CM | POA: Diagnosis not present

## 2022-08-15 DIAGNOSIS — D509 Iron deficiency anemia, unspecified: Secondary | ICD-10-CM | POA: Diagnosis not present

## 2022-08-15 DIAGNOSIS — N2581 Secondary hyperparathyroidism of renal origin: Secondary | ICD-10-CM | POA: Diagnosis not present

## 2022-08-17 DIAGNOSIS — D509 Iron deficiency anemia, unspecified: Secondary | ICD-10-CM | POA: Diagnosis not present

## 2022-08-17 DIAGNOSIS — Z992 Dependence on renal dialysis: Secondary | ICD-10-CM | POA: Diagnosis not present

## 2022-08-17 DIAGNOSIS — D631 Anemia in chronic kidney disease: Secondary | ICD-10-CM | POA: Diagnosis not present

## 2022-08-17 DIAGNOSIS — N186 End stage renal disease: Secondary | ICD-10-CM | POA: Diagnosis not present

## 2022-08-17 DIAGNOSIS — N2581 Secondary hyperparathyroidism of renal origin: Secondary | ICD-10-CM | POA: Diagnosis not present

## 2022-08-17 DIAGNOSIS — E119 Type 2 diabetes mellitus without complications: Secondary | ICD-10-CM | POA: Diagnosis not present

## 2022-08-20 DIAGNOSIS — Z992 Dependence on renal dialysis: Secondary | ICD-10-CM | POA: Diagnosis not present

## 2022-08-20 DIAGNOSIS — N2581 Secondary hyperparathyroidism of renal origin: Secondary | ICD-10-CM | POA: Diagnosis not present

## 2022-08-20 DIAGNOSIS — E119 Type 2 diabetes mellitus without complications: Secondary | ICD-10-CM | POA: Diagnosis not present

## 2022-08-20 DIAGNOSIS — D509 Iron deficiency anemia, unspecified: Secondary | ICD-10-CM | POA: Diagnosis not present

## 2022-08-20 DIAGNOSIS — N186 End stage renal disease: Secondary | ICD-10-CM | POA: Diagnosis not present

## 2022-08-20 DIAGNOSIS — D631 Anemia in chronic kidney disease: Secondary | ICD-10-CM | POA: Diagnosis not present

## 2022-08-22 DIAGNOSIS — N186 End stage renal disease: Secondary | ICD-10-CM | POA: Diagnosis not present

## 2022-08-22 DIAGNOSIS — E119 Type 2 diabetes mellitus without complications: Secondary | ICD-10-CM | POA: Diagnosis not present

## 2022-08-22 DIAGNOSIS — D631 Anemia in chronic kidney disease: Secondary | ICD-10-CM | POA: Diagnosis not present

## 2022-08-22 DIAGNOSIS — Z992 Dependence on renal dialysis: Secondary | ICD-10-CM | POA: Diagnosis not present

## 2022-08-22 DIAGNOSIS — N2581 Secondary hyperparathyroidism of renal origin: Secondary | ICD-10-CM | POA: Diagnosis not present

## 2022-08-22 DIAGNOSIS — D509 Iron deficiency anemia, unspecified: Secondary | ICD-10-CM | POA: Diagnosis not present

## 2022-08-24 DIAGNOSIS — D631 Anemia in chronic kidney disease: Secondary | ICD-10-CM | POA: Diagnosis not present

## 2022-08-24 DIAGNOSIS — E119 Type 2 diabetes mellitus without complications: Secondary | ICD-10-CM | POA: Diagnosis not present

## 2022-08-24 DIAGNOSIS — D509 Iron deficiency anemia, unspecified: Secondary | ICD-10-CM | POA: Diagnosis not present

## 2022-08-24 DIAGNOSIS — N2581 Secondary hyperparathyroidism of renal origin: Secondary | ICD-10-CM | POA: Diagnosis not present

## 2022-08-24 DIAGNOSIS — N186 End stage renal disease: Secondary | ICD-10-CM | POA: Diagnosis not present

## 2022-08-24 DIAGNOSIS — Z992 Dependence on renal dialysis: Secondary | ICD-10-CM | POA: Diagnosis not present

## 2022-08-27 DIAGNOSIS — D631 Anemia in chronic kidney disease: Secondary | ICD-10-CM | POA: Diagnosis not present

## 2022-08-27 DIAGNOSIS — N186 End stage renal disease: Secondary | ICD-10-CM | POA: Diagnosis not present

## 2022-08-27 DIAGNOSIS — E119 Type 2 diabetes mellitus without complications: Secondary | ICD-10-CM | POA: Diagnosis not present

## 2022-08-27 DIAGNOSIS — N2581 Secondary hyperparathyroidism of renal origin: Secondary | ICD-10-CM | POA: Diagnosis not present

## 2022-08-27 DIAGNOSIS — Z992 Dependence on renal dialysis: Secondary | ICD-10-CM | POA: Diagnosis not present

## 2022-08-27 DIAGNOSIS — D509 Iron deficiency anemia, unspecified: Secondary | ICD-10-CM | POA: Diagnosis not present

## 2022-08-29 DIAGNOSIS — D631 Anemia in chronic kidney disease: Secondary | ICD-10-CM | POA: Diagnosis not present

## 2022-08-29 DIAGNOSIS — E1129 Type 2 diabetes mellitus with other diabetic kidney complication: Secondary | ICD-10-CM | POA: Diagnosis not present

## 2022-08-29 DIAGNOSIS — D509 Iron deficiency anemia, unspecified: Secondary | ICD-10-CM | POA: Diagnosis not present

## 2022-08-29 DIAGNOSIS — E119 Type 2 diabetes mellitus without complications: Secondary | ICD-10-CM | POA: Diagnosis not present

## 2022-08-29 DIAGNOSIS — Z992 Dependence on renal dialysis: Secondary | ICD-10-CM | POA: Diagnosis not present

## 2022-08-29 DIAGNOSIS — N2581 Secondary hyperparathyroidism of renal origin: Secondary | ICD-10-CM | POA: Diagnosis not present

## 2022-08-29 DIAGNOSIS — N186 End stage renal disease: Secondary | ICD-10-CM | POA: Diagnosis not present

## 2022-08-31 DIAGNOSIS — E119 Type 2 diabetes mellitus without complications: Secondary | ICD-10-CM | POA: Diagnosis not present

## 2022-08-31 DIAGNOSIS — D631 Anemia in chronic kidney disease: Secondary | ICD-10-CM | POA: Diagnosis not present

## 2022-08-31 DIAGNOSIS — N186 End stage renal disease: Secondary | ICD-10-CM | POA: Diagnosis not present

## 2022-08-31 DIAGNOSIS — N2581 Secondary hyperparathyroidism of renal origin: Secondary | ICD-10-CM | POA: Diagnosis not present

## 2022-08-31 DIAGNOSIS — Z992 Dependence on renal dialysis: Secondary | ICD-10-CM | POA: Diagnosis not present

## 2022-08-31 DIAGNOSIS — D509 Iron deficiency anemia, unspecified: Secondary | ICD-10-CM | POA: Diagnosis not present

## 2022-09-03 DIAGNOSIS — D509 Iron deficiency anemia, unspecified: Secondary | ICD-10-CM | POA: Diagnosis not present

## 2022-09-03 DIAGNOSIS — E119 Type 2 diabetes mellitus without complications: Secondary | ICD-10-CM | POA: Diagnosis not present

## 2022-09-03 DIAGNOSIS — D631 Anemia in chronic kidney disease: Secondary | ICD-10-CM | POA: Diagnosis not present

## 2022-09-03 DIAGNOSIS — Z992 Dependence on renal dialysis: Secondary | ICD-10-CM | POA: Diagnosis not present

## 2022-09-03 DIAGNOSIS — N2581 Secondary hyperparathyroidism of renal origin: Secondary | ICD-10-CM | POA: Diagnosis not present

## 2022-09-03 DIAGNOSIS — N186 End stage renal disease: Secondary | ICD-10-CM | POA: Diagnosis not present

## 2022-09-05 DIAGNOSIS — N186 End stage renal disease: Secondary | ICD-10-CM | POA: Diagnosis not present

## 2022-09-05 DIAGNOSIS — D631 Anemia in chronic kidney disease: Secondary | ICD-10-CM | POA: Diagnosis not present

## 2022-09-05 DIAGNOSIS — Z992 Dependence on renal dialysis: Secondary | ICD-10-CM | POA: Diagnosis not present

## 2022-09-05 DIAGNOSIS — N2581 Secondary hyperparathyroidism of renal origin: Secondary | ICD-10-CM | POA: Diagnosis not present

## 2022-09-05 DIAGNOSIS — D509 Iron deficiency anemia, unspecified: Secondary | ICD-10-CM | POA: Diagnosis not present

## 2022-09-05 DIAGNOSIS — E119 Type 2 diabetes mellitus without complications: Secondary | ICD-10-CM | POA: Diagnosis not present

## 2022-09-07 DIAGNOSIS — Z992 Dependence on renal dialysis: Secondary | ICD-10-CM | POA: Diagnosis not present

## 2022-09-07 DIAGNOSIS — N186 End stage renal disease: Secondary | ICD-10-CM | POA: Diagnosis not present

## 2022-09-07 DIAGNOSIS — E119 Type 2 diabetes mellitus without complications: Secondary | ICD-10-CM | POA: Diagnosis not present

## 2022-09-07 DIAGNOSIS — D689 Coagulation defect, unspecified: Secondary | ICD-10-CM | POA: Diagnosis not present

## 2022-09-07 DIAGNOSIS — I120 Hypertensive chronic kidney disease with stage 5 chronic kidney disease or end stage renal disease: Secondary | ICD-10-CM | POA: Diagnosis not present

## 2022-09-07 DIAGNOSIS — D631 Anemia in chronic kidney disease: Secondary | ICD-10-CM | POA: Diagnosis not present

## 2022-09-07 DIAGNOSIS — D509 Iron deficiency anemia, unspecified: Secondary | ICD-10-CM | POA: Diagnosis not present

## 2022-09-07 DIAGNOSIS — N2581 Secondary hyperparathyroidism of renal origin: Secondary | ICD-10-CM | POA: Diagnosis not present

## 2022-09-10 DIAGNOSIS — E119 Type 2 diabetes mellitus without complications: Secondary | ICD-10-CM | POA: Diagnosis not present

## 2022-09-10 DIAGNOSIS — D509 Iron deficiency anemia, unspecified: Secondary | ICD-10-CM | POA: Diagnosis not present

## 2022-09-10 DIAGNOSIS — N186 End stage renal disease: Secondary | ICD-10-CM | POA: Diagnosis not present

## 2022-09-10 DIAGNOSIS — Z992 Dependence on renal dialysis: Secondary | ICD-10-CM | POA: Diagnosis not present

## 2022-09-10 DIAGNOSIS — D689 Coagulation defect, unspecified: Secondary | ICD-10-CM | POA: Diagnosis not present

## 2022-09-10 DIAGNOSIS — D631 Anemia in chronic kidney disease: Secondary | ICD-10-CM | POA: Diagnosis not present

## 2022-09-12 DIAGNOSIS — D509 Iron deficiency anemia, unspecified: Secondary | ICD-10-CM | POA: Diagnosis not present

## 2022-09-12 DIAGNOSIS — E119 Type 2 diabetes mellitus without complications: Secondary | ICD-10-CM | POA: Diagnosis not present

## 2022-09-12 DIAGNOSIS — D631 Anemia in chronic kidney disease: Secondary | ICD-10-CM | POA: Diagnosis not present

## 2022-09-12 DIAGNOSIS — N186 End stage renal disease: Secondary | ICD-10-CM | POA: Diagnosis not present

## 2022-09-12 DIAGNOSIS — D689 Coagulation defect, unspecified: Secondary | ICD-10-CM | POA: Diagnosis not present

## 2022-09-12 DIAGNOSIS — Z992 Dependence on renal dialysis: Secondary | ICD-10-CM | POA: Diagnosis not present

## 2022-09-13 DIAGNOSIS — H401133 Primary open-angle glaucoma, bilateral, severe stage: Secondary | ICD-10-CM | POA: Diagnosis not present

## 2022-09-13 DIAGNOSIS — E113553 Type 2 diabetes mellitus with stable proliferative diabetic retinopathy, bilateral: Secondary | ICD-10-CM | POA: Diagnosis not present

## 2022-09-13 DIAGNOSIS — H33051 Total retinal detachment, right eye: Secondary | ICD-10-CM | POA: Diagnosis not present

## 2022-09-13 DIAGNOSIS — Z961 Presence of intraocular lens: Secondary | ICD-10-CM | POA: Diagnosis not present

## 2022-09-13 DIAGNOSIS — H31093 Other chorioretinal scars, bilateral: Secondary | ICD-10-CM | POA: Diagnosis not present

## 2022-09-13 DIAGNOSIS — H18511 Endothelial corneal dystrophy, right eye: Secondary | ICD-10-CM | POA: Diagnosis not present

## 2022-09-13 DIAGNOSIS — H16223 Keratoconjunctivitis sicca, not specified as Sjogren's, bilateral: Secondary | ICD-10-CM | POA: Diagnosis not present

## 2022-09-17 DIAGNOSIS — D631 Anemia in chronic kidney disease: Secondary | ICD-10-CM | POA: Diagnosis not present

## 2022-09-17 DIAGNOSIS — N186 End stage renal disease: Secondary | ICD-10-CM | POA: Diagnosis not present

## 2022-09-17 DIAGNOSIS — D509 Iron deficiency anemia, unspecified: Secondary | ICD-10-CM | POA: Diagnosis not present

## 2022-09-17 DIAGNOSIS — Z992 Dependence on renal dialysis: Secondary | ICD-10-CM | POA: Diagnosis not present

## 2022-09-17 DIAGNOSIS — E119 Type 2 diabetes mellitus without complications: Secondary | ICD-10-CM | POA: Diagnosis not present

## 2022-09-17 DIAGNOSIS — D689 Coagulation defect, unspecified: Secondary | ICD-10-CM | POA: Diagnosis not present

## 2022-09-18 ENCOUNTER — Ambulatory Visit (INDEPENDENT_AMBULATORY_CARE_PROVIDER_SITE_OTHER): Payer: Medicare Other | Admitting: Podiatry

## 2022-09-18 VITALS — BP 160/73

## 2022-09-18 DIAGNOSIS — E119 Type 2 diabetes mellitus without complications: Secondary | ICD-10-CM

## 2022-09-18 DIAGNOSIS — M2042 Other hammer toe(s) (acquired), left foot: Secondary | ICD-10-CM

## 2022-09-18 DIAGNOSIS — L988 Other specified disorders of the skin and subcutaneous tissue: Secondary | ICD-10-CM

## 2022-09-18 DIAGNOSIS — N186 End stage renal disease: Secondary | ICD-10-CM | POA: Diagnosis not present

## 2022-09-18 DIAGNOSIS — M2041 Other hammer toe(s) (acquired), right foot: Secondary | ICD-10-CM | POA: Diagnosis not present

## 2022-09-18 DIAGNOSIS — M79675 Pain in left toe(s): Secondary | ICD-10-CM | POA: Diagnosis not present

## 2022-09-18 DIAGNOSIS — M79674 Pain in right toe(s): Secondary | ICD-10-CM

## 2022-09-18 DIAGNOSIS — E11628 Type 2 diabetes mellitus with other skin complications: Secondary | ICD-10-CM

## 2022-09-18 DIAGNOSIS — B351 Tinea unguium: Secondary | ICD-10-CM | POA: Diagnosis not present

## 2022-09-18 DIAGNOSIS — E1151 Type 2 diabetes mellitus with diabetic peripheral angiopathy without gangrene: Secondary | ICD-10-CM | POA: Diagnosis not present

## 2022-09-18 DIAGNOSIS — Z992 Dependence on renal dialysis: Secondary | ICD-10-CM

## 2022-09-18 NOTE — Progress Notes (Signed)
ANNUAL DIABETIC FOOT EXAM  Subjective: Shawn [name] presents today for annual diabetic foot examination.  Chief Complaint  Patient presents with   Nail Problem    DFC BS-did not check today A1C-6.5 PCP-Kilpatrick PCP VST-06/2022   Patient confirms h/o diabetes.  Patient denies any h/o foot wounds.  Risk factors: diabetes, h/o CVA, ESRD on hemodialysis.  Vincente Liberty, MD is patient's PCP. Past Medical History:  Diagnosis Date   Anemia    Arthritis    patient denies   Blind right eye    Chronic kidney disease    Dialysis since [id number] MWF   Complication of anesthesia    Diabetes mellitus    type II   Hypertension    Legally blind    No pertinent past medical history    PONV (postoperative nausea and vomiting)    N/V- became dehydrated had to have IV fluids- 01/2015   Shortness of breath dyspnea    when has too fluid   Stroke ([place]) 2012   date per patient   Syncope    Unspecified cerebral artery occlusion with cerebral infarction 04/02/2013   Patient Active Problem List   Diagnosis Date Noted   Other disorders of phosphorus metabolism 01/03/2022   Unspecified kidney failure 01/03/2022   Fluid overload, unspecified 12/15/2019   Skin lesion 06/25/2019   Breast lump [id number]   Chronic systolic heart failure ([place]) 06/24/2019   Abnormal findings on diagnostic imaging of lung 06/15/2019   Shortness of breath 06/15/2019   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   Mild protein-calorie malnutrition ([place]) 06/10/2019   S/P thoracentesis    Acute respiratory failure with hypercapnia ([place]) 05/25/2019   Acute hypercapnic respiratory failure ([place]) 05/25/2019   History of CVA (cerebrovascular accident) 05/05/2019   Pure hypercholesterolemia 05/05/2019   Hypokalemia 04/17/2019   Pain due to onychomycosis of toenails of both feet 12/23/2018   Heart palpitations 12/21/2017   Tingling of left upper extremity 12/21/2017   Cerebral infarction, unspecified ([place]) 05/23/2017    Syncope 01/23/2016   Hypertension 01/23/2016   Orthostatic hypotension 01/23/2016   Legally blind    Diabetes ([place])    Diabetes mellitus with complication ([place])    Complication of vascular dialysis catheter 12/14/2015   Dependence on renal dialysis ([place]) 05/03/2015   Intracranial atherosclerosis 08/10/2014   Other disorders of plasma-protein metabolism, not elsewhere classified 07/15/2014   Pain, unspecified 03/25/2014   Type 2 diabetes mellitus with diabetic peripheral angiopathy without gangrene ([place]) 03/25/2014   Encounter for screening for respiratory tuberculosis 10/02/2013   End stage renal disease ([place]) 06/12/2013   ESRD (end stage renal disease) on dialysis ([place]) 04/17/2013   Coagulation defect, unspecified ([place]) 04/14/2013   Cerebral artery occlusion with cerebral infarction ([place]) 04/02/2013   Dizziness and giddiness 04/02/2013   Anemia in chronic kidney disease 09/04/2011   Secondary hyperparathyroidism of renal origin ([place]) 09/04/2011   Hyperlipidemia, unspecified 02/16/2010   Hyperparathyroidism, unspecified ([place]) 02/16/2010   Iron deficiency anemia, unspecified 02/16/2010   Past Surgical History:  Procedure Laterality Date   AV FISTULA PLACEMENT Right 05/12/2013   Procedure: INSERTION OF ARTERIOVENOUS (AV) GORE-TEX GRAFT ARM; ULTRASOUND GUIDED;  Surgeon: Conrad [place], MD;  Location: [facility];  Service: Vascular;  Laterality: Right;   AV FISTULA PLACEMENT Left 01/18/2015   Procedure: INSERTION OF ARTERIOVENOUS GORE-TEX GRAFT LEFT UPPER ARM;  Surgeon: Conrad Dennard, MD;  Location: [facility];  Service: Vascular;  Laterality: Left;   COLONOSCOPY  06/2012   EYE SURGERY Bilateral  retina surgery both eyes, cataract surgery both eyes   HERNIA REPAIR     right inguinal   INSERTION OF DIALYSIS CATHETER Right    left arm graft  10/2010   LIGATION ARTERIOVENOUS GORTEX GRAFT Left 05/03/2015   Procedure: LIGATION ARTERIOVENOUS GORTEX GRAFT-LEFT UPPER ARM;  Surgeon: Conrad [place], MD;   Location: [facility];  Service: Vascular;  Laterality: Left;   Current Outpatient Medications on File Prior to Visit  Medication Sig Dispense Refill   amLODipine (NORVASC) 5 MG tablet Take by mouth.     aspirin EC 325 MG tablet Take 325 mg by mouth at bedtime.     b complex vitamins tablet Take 1 tablet by mouth at bedtime.     brimonidine (ALPHAGAN) 0.15 % ophthalmic solution      brimonidine (ALPHAGAN) 0.2 % ophthalmic solution 1 drop 2 (two) times daily.     calcitRIOL (ROCALTROL) 0.5 MCG capsule Take 1 capsule (0.5 mcg total) by mouth 3 (three) times a week. 30 capsule 0   diphenhydramine-acetaminophen (TYLENOL PM) 25-500 MG TABS tablet Take 1 tablet by mouth at bedtime as needed (pain).     dorzolamide-timolol (COSOPT) 22.3-6.8 MG/ML ophthalmic solution Place 1 drop into both eyes 2 (two) times daily.     Doxercalciferol (HECTOROL IV) Doxercalciferol (Hectorol)     DROPLET PEN NEEDLES 31G X 6 MM MISC      erythromycin ophthalmic ointment Apply to eye as needed.     hydrOXYzine (ATARAX/VISTARIL) 10 MG tablet Take 10 mg by mouth daily as needed for itching.      lanthanum (FOSRENOL) 1000 MG chewable tablet Chew 1,000 mg by mouth 3 (three) times daily with meals.     latanoprost (XALATAN) 0.005 % ophthalmic solution Place 1 drop into both eyes at bedtime.      LEVEMIR FLEXTOUCH 100 UNIT/ML FlexPen Inject into the skin 2 (two) times daily.     lisinopril (ZESTRIL) 5 MG tablet Take 5 mg by mouth daily.     rosuvastatin (CRESTOR) 40 MG tablet Take 40 mg by mouth daily.     VELPHORO 500 MG chewable tablet Chew 1,000 mg by mouth 3 (three) times daily.     No current facility-administered medications on file prior to visit.    No Known Allergies Social History   Occupational History    Employer: UNEMPLOYED  Tobacco Use   Smoking status: Never   Smokeless tobacco: Never  Vaping Use   Vaping Use: Never used  Substance and Sexual Activity   Alcohol use: No    Alcohol/week: 0.0 standard  drinks of alcohol   Drug use: No   Sexual activity: Not on file   Family History  Problem Relation Age of Onset   Diabetes Mother    Alzheimer's disease Mother    Cancer Father    Heart disease Father    Cancer Sister    Stroke Sister    Immunization History  Administered Date(s) Administered   Hepatitis B, ADULT 11/02/2016   Influenza,inj,Quad PF,6+ Mos 04/06/2019   Pneumococcal Conjugate-13 04/03/2013   Pneumococcal Polysaccharide-23 06/06/2016     Review of Systems: Negative except as noted in the HPI.   Objective: Vitals:   09/18/22 0844  BP: (!) 160/73   Shawn [name] is a pleasant 62 y.o. male in NAD. AAO X 3.  Vascular Examination: CFT <4 seconds b/l. DP pulses diminished b/l. PT pulses diminished b/l. Digital hair absent. Skin temperature gradient warm to cool b/l. No ischemia or gangrene. No  cyanosis or clubbing noted b/l. No edema noted b/l LE. No varicosities noted.   Neurological Examination: Protective sensation diminished with 10g monofilament b/l. Vibratory sensation intact b/l.  Dermatological Examination: Pedal skin thin, shiny and atrophic b/l. No open wounds. No interdigital macerations.   Toenails 1-5 b/l thick, discolored, elongated with subungual debris and pain on dorsal palpation.   No hyperkeratotic nor porokeratotic lesions present on today's visit.  Musculoskeletal Examination: Muscle strength 5/5 to b/l LE. Hammertoe(s) noted to the L 2nd toe and R 2nd toe.  Radiographs: None  Footwear Assessment: Does the patient wear appropriate shoes? Yes. Does the patient need inserts/orthotics? No.  Lab Results  Component Value Date   HGBA1C 7.5 (H) 12/21/2017   ADA Risk Categorization: High Risk  Patient has one or more of the following: Loss of protective sensation Absent pedal pulses Severe Foot deformity History of foot ulcer  Assessment: 1. Pain due to onychomycosis of toenails of both feet   2. Acquired hammertoes of both  feet   3. ESRD on dialysis ([place])   4. 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 Type II diabetes mellitus with peripheral circulatory disorder (HCC)   6. Encounter for diabetic foot exam [facility])     Plan: -Patient's family member present. All questions/concerns addressed on today's visit. -Diabetic foot examination performed today. -Stressed the importance of good glycemic control and the detriment of not  controlling glucose levels in relation to the foot. -Continue diabetic foot care principles: inspect feet daily, monitor glucose as recommended by PCP and/or Endocrinologist, and follow prescribed diet per PCP, Endocrinologist and/or dietician. -Patient to continue soft, supportive shoe gear daily. -Mycotic toenails 1-5 bilaterally were debrided in length and girth with sterile nail nippers and dremel without incident. -Patient/POA to call should there be question/concern in the interim. Return in about 9 weeks (around 11/20/2022).  Marzetta Board, DPM

## 2022-09-19 DIAGNOSIS — Z992 Dependence on renal dialysis: Secondary | ICD-10-CM | POA: Diagnosis not present

## 2022-09-19 DIAGNOSIS — D689 Coagulation defect, unspecified: Secondary | ICD-10-CM | POA: Diagnosis not present

## 2022-09-19 DIAGNOSIS — D631 Anemia in chronic kidney disease: Secondary | ICD-10-CM | POA: Diagnosis not present

## 2022-09-19 DIAGNOSIS — E119 Type 2 diabetes mellitus without complications: Secondary | ICD-10-CM | POA: Diagnosis not present

## 2022-09-19 DIAGNOSIS — N186 End stage renal disease: Secondary | ICD-10-CM | POA: Diagnosis not present

## 2022-09-19 DIAGNOSIS — D509 Iron deficiency anemia, unspecified: Secondary | ICD-10-CM | POA: Diagnosis not present

## 2022-09-21 ENCOUNTER — Encounter: Payer: Self-pay | Admitting: Podiatry

## 2022-09-21 DIAGNOSIS — Z992 Dependence on renal dialysis: Secondary | ICD-10-CM | POA: Diagnosis not present

## 2022-09-21 DIAGNOSIS — D689 Coagulation defect, unspecified: Secondary | ICD-10-CM | POA: Diagnosis not present

## 2022-09-21 DIAGNOSIS — N186 End stage renal disease: Secondary | ICD-10-CM | POA: Diagnosis not present

## 2022-09-21 DIAGNOSIS — D509 Iron deficiency anemia, unspecified: Secondary | ICD-10-CM | POA: Diagnosis not present

## 2022-09-21 DIAGNOSIS — D631 Anemia in chronic kidney disease: Secondary | ICD-10-CM | POA: Diagnosis not present

## 2022-09-21 DIAGNOSIS — E119 Type 2 diabetes mellitus without complications: Secondary | ICD-10-CM | POA: Diagnosis not present

## 2022-09-24 DIAGNOSIS — D631 Anemia in chronic kidney disease: Secondary | ICD-10-CM | POA: Diagnosis not present

## 2022-09-24 DIAGNOSIS — E119 Type 2 diabetes mellitus without complications: Secondary | ICD-10-CM | POA: Diagnosis not present

## 2022-09-24 DIAGNOSIS — Z992 Dependence on renal dialysis: Secondary | ICD-10-CM | POA: Diagnosis not present

## 2022-09-24 DIAGNOSIS — D689 Coagulation defect, unspecified: Secondary | ICD-10-CM | POA: Diagnosis not present

## 2022-09-24 DIAGNOSIS — D509 Iron deficiency anemia, unspecified: Secondary | ICD-10-CM | POA: Diagnosis not present

## 2022-09-24 DIAGNOSIS — N186 End stage renal disease: Secondary | ICD-10-CM | POA: Diagnosis not present

## 2022-09-26 DIAGNOSIS — D631 Anemia in chronic kidney disease: Secondary | ICD-10-CM | POA: Diagnosis not present

## 2022-09-26 DIAGNOSIS — N186 End stage renal disease: Secondary | ICD-10-CM | POA: Diagnosis not present

## 2022-09-26 DIAGNOSIS — E119 Type 2 diabetes mellitus without complications: Secondary | ICD-10-CM | POA: Diagnosis not present

## 2022-09-26 DIAGNOSIS — D689 Coagulation defect, unspecified: Secondary | ICD-10-CM | POA: Diagnosis not present

## 2022-09-26 DIAGNOSIS — D509 Iron deficiency anemia, unspecified: Secondary | ICD-10-CM | POA: Diagnosis not present

## 2022-09-26 DIAGNOSIS — Z992 Dependence on renal dialysis: Secondary | ICD-10-CM | POA: Diagnosis not present

## 2022-09-28 DIAGNOSIS — D689 Coagulation defect, unspecified: Secondary | ICD-10-CM | POA: Diagnosis not present

## 2022-09-28 DIAGNOSIS — E119 Type 2 diabetes mellitus without complications: Secondary | ICD-10-CM | POA: Diagnosis not present

## 2022-09-28 DIAGNOSIS — D631 Anemia in chronic kidney disease: Secondary | ICD-10-CM | POA: Diagnosis not present

## 2022-09-28 DIAGNOSIS — Z992 Dependence on renal dialysis: Secondary | ICD-10-CM | POA: Diagnosis not present

## 2022-09-28 DIAGNOSIS — N186 End stage renal disease: Secondary | ICD-10-CM | POA: Diagnosis not present

## 2022-09-28 DIAGNOSIS — D509 Iron deficiency anemia, unspecified: Secondary | ICD-10-CM | POA: Diagnosis not present

## 2022-10-01 DIAGNOSIS — N186 End stage renal disease: Secondary | ICD-10-CM | POA: Diagnosis not present

## 2022-10-01 DIAGNOSIS — Z992 Dependence on renal dialysis: Secondary | ICD-10-CM | POA: Diagnosis not present

## 2022-10-01 DIAGNOSIS — D689 Coagulation defect, unspecified: Secondary | ICD-10-CM | POA: Diagnosis not present

## 2022-10-01 DIAGNOSIS — E119 Type 2 diabetes mellitus without complications: Secondary | ICD-10-CM | POA: Diagnosis not present

## 2022-10-01 DIAGNOSIS — D631 Anemia in chronic kidney disease: Secondary | ICD-10-CM | POA: Diagnosis not present

## 2022-10-01 DIAGNOSIS — D509 Iron deficiency anemia, unspecified: Secondary | ICD-10-CM | POA: Diagnosis not present

## 2022-10-03 DIAGNOSIS — D631 Anemia in chronic kidney disease: Secondary | ICD-10-CM | POA: Diagnosis not present

## 2022-10-03 DIAGNOSIS — D509 Iron deficiency anemia, unspecified: Secondary | ICD-10-CM | POA: Diagnosis not present

## 2022-10-03 DIAGNOSIS — E119 Type 2 diabetes mellitus without complications: Secondary | ICD-10-CM | POA: Diagnosis not present

## 2022-10-03 DIAGNOSIS — D689 Coagulation defect, unspecified: Secondary | ICD-10-CM | POA: Diagnosis not present

## 2022-10-03 DIAGNOSIS — Z992 Dependence on renal dialysis: Secondary | ICD-10-CM | POA: Diagnosis not present

## 2022-10-03 DIAGNOSIS — N186 End stage renal disease: Secondary | ICD-10-CM | POA: Diagnosis not present

## 2022-10-04 DIAGNOSIS — M1990 Unspecified osteoarthritis, unspecified site: Secondary | ICD-10-CM | POA: Diagnosis not present

## 2022-10-04 DIAGNOSIS — Z79899 Other long term (current) drug therapy: Secondary | ICD-10-CM | POA: Diagnosis not present

## 2022-10-04 DIAGNOSIS — I6990 Unspecified sequelae of unspecified cerebrovascular disease: Secondary | ICD-10-CM | POA: Diagnosis not present

## 2022-10-04 DIAGNOSIS — M1220 Villonodular synovitis (pigmented), unspecified site: Secondary | ICD-10-CM | POA: Diagnosis not present

## 2022-10-04 DIAGNOSIS — H547 Unspecified visual loss: Secondary | ICD-10-CM | POA: Diagnosis not present

## 2022-10-04 DIAGNOSIS — E1165 Type 2 diabetes mellitus with hyperglycemia: Secondary | ICD-10-CM | POA: Diagnosis not present

## 2022-10-04 DIAGNOSIS — R55 Syncope and collapse: Secondary | ICD-10-CM | POA: Diagnosis not present

## 2022-10-04 DIAGNOSIS — E559 Vitamin D deficiency, unspecified: Secondary | ICD-10-CM | POA: Diagnosis not present

## 2022-10-04 DIAGNOSIS — I999 Unspecified disorder of circulatory system: Secondary | ICD-10-CM | POA: Diagnosis not present

## 2022-10-04 DIAGNOSIS — M12512 Traumatic arthropathy, left shoulder: Secondary | ICD-10-CM | POA: Diagnosis not present

## 2022-10-04 DIAGNOSIS — I119 Hypertensive heart disease without heart failure: Secondary | ICD-10-CM | POA: Diagnosis not present

## 2022-10-04 DIAGNOSIS — E7800 Pure hypercholesterolemia, unspecified: Secondary | ICD-10-CM | POA: Diagnosis not present

## 2022-10-05 ENCOUNTER — Telehealth: Payer: Self-pay | Admitting: Cardiology

## 2022-10-05 DIAGNOSIS — E119 Type 2 diabetes mellitus without complications: Secondary | ICD-10-CM | POA: Diagnosis not present

## 2022-10-05 DIAGNOSIS — D631 Anemia in chronic kidney disease: Secondary | ICD-10-CM | POA: Diagnosis not present

## 2022-10-05 DIAGNOSIS — D509 Iron deficiency anemia, unspecified: Secondary | ICD-10-CM | POA: Diagnosis not present

## 2022-10-05 DIAGNOSIS — N186 End stage renal disease: Secondary | ICD-10-CM | POA: Diagnosis not present

## 2022-10-05 DIAGNOSIS — Z992 Dependence on renal dialysis: Secondary | ICD-10-CM | POA: Diagnosis not present

## 2022-10-05 DIAGNOSIS — D689 Coagulation defect, unspecified: Secondary | ICD-10-CM | POA: Diagnosis not present

## 2022-10-05 NOTE — Telephone Encounter (Signed)
Returned call to patient,   Patient states that when he walks or moves around he starts breathing real hard and coughs. He states he hasn't sick. He states that he was put on blood pressures because his pressures have been in the 170-180s. He states when he left dialysis his pressure was 175/87. He states this has been going about a little over a week but cannot pinpoint any changes that might be causing the issue. He states he is not short of breath when at rest, but is still coughing. When asked if he thought that he might still have some fluid he states he doesn't think so, and he is at his dry weight. Advised patient that he should follow up with primary care regarding cough, but we could forward for blood pressure. He states that he saw his primary care yesterday and they advised him to call us.   When asked about checking his blood pressure at home he states he does not have money to buy a blood pressure cuff.   Will route for review.

## 2022-10-05 NOTE — Telephone Encounter (Signed)
Pt c/o Shortness Of Breath: STAT if SOB developed within the last 24 hours or pt is noticeably SOB on the phone  1. Are you currently SOB (can you hear that pt is SOB on the phone)? No  2. How long have you been experiencing SOB? A week  3. Are you SOB when sitting or when up moving around? Moving around   4. Are you currently experiencing any other symptoms? Pt stated they will start coughing when they are laying a certain way. Pt stated their BP was 175/87 today. Please advise

## 2022-10-07 ENCOUNTER — Other Ambulatory Visit: Payer: Self-pay

## 2022-10-07 ENCOUNTER — Encounter (HOSPITAL_COMMUNITY): Payer: Self-pay | Admitting: Internal Medicine

## 2022-10-07 ENCOUNTER — Emergency Department (HOSPITAL_COMMUNITY): Payer: Medicare Other

## 2022-10-07 ENCOUNTER — Observation Stay (HOSPITAL_COMMUNITY)
Admission: EM | Admit: 2022-10-07 | Discharge: 2022-10-08 | Disposition: A | Discharge status: Home / Self Care | Payer: Medicare Other | Attending: Internal Medicine | Admitting: Internal Medicine

## 2022-10-07 DIAGNOSIS — J90 Pleural effusion, not elsewhere classified: Secondary | ICD-10-CM | POA: Insufficient documentation

## 2022-10-07 DIAGNOSIS — E871 Hypo-osmolality and hyponatremia: Secondary | ICD-10-CM | POA: Insufficient documentation

## 2022-10-07 DIAGNOSIS — N186 End stage renal disease: Secondary | ICD-10-CM | POA: Diagnosis not present

## 2022-10-07 DIAGNOSIS — D75839 Thrombocytosis, unspecified: Secondary | ICD-10-CM | POA: Diagnosis not present

## 2022-10-07 DIAGNOSIS — E1122 Type 2 diabetes mellitus with diabetic chronic kidney disease: Secondary | ICD-10-CM | POA: Diagnosis not present

## 2022-10-07 DIAGNOSIS — E1165 Type 2 diabetes mellitus with hyperglycemia: Secondary | ICD-10-CM

## 2022-10-07 DIAGNOSIS — Z794 Long term (current) use of insulin: Secondary | ICD-10-CM | POA: Diagnosis not present

## 2022-10-07 DIAGNOSIS — E11649 Type 2 diabetes mellitus with hypoglycemia without coma: Secondary | ICD-10-CM | POA: Insufficient documentation

## 2022-10-07 DIAGNOSIS — I10 Essential (primary) hypertension: Secondary | ICD-10-CM | POA: Diagnosis not present

## 2022-10-07 DIAGNOSIS — Z8673 Personal history of transient ischemic attack (TIA), and cerebral infarction without residual deficits: Secondary | ICD-10-CM | POA: Diagnosis not present

## 2022-10-07 DIAGNOSIS — Z79899 Other long term (current) drug therapy: Secondary | ICD-10-CM | POA: Insufficient documentation

## 2022-10-07 DIAGNOSIS — R7989 Other specified abnormal findings of blood chemistry: Secondary | ICD-10-CM | POA: Insufficient documentation

## 2022-10-07 DIAGNOSIS — E876 Hypokalemia: Secondary | ICD-10-CM | POA: Diagnosis not present

## 2022-10-07 DIAGNOSIS — J9601 Acute respiratory failure with hypoxia: Secondary | ICD-10-CM | POA: Diagnosis not present

## 2022-10-07 DIAGNOSIS — Z992 Dependence on renal dialysis: Secondary | ICD-10-CM | POA: Insufficient documentation

## 2022-10-07 DIAGNOSIS — Z7982 Long term (current) use of aspirin: Secondary | ICD-10-CM | POA: Insufficient documentation

## 2022-10-07 DIAGNOSIS — E782 Mixed hyperlipidemia: Secondary | ICD-10-CM | POA: Diagnosis not present

## 2022-10-07 DIAGNOSIS — I120 Hypertensive chronic kidney disease with stage 5 chronic kidney disease or end stage renal disease: Secondary | ICD-10-CM | POA: Diagnosis not present

## 2022-10-07 DIAGNOSIS — R0602 Shortness of breath: Secondary | ICD-10-CM | POA: Diagnosis not present

## 2022-10-07 DIAGNOSIS — Z1152 Encounter for screening for COVID-19: Secondary | ICD-10-CM | POA: Insufficient documentation

## 2022-10-07 DIAGNOSIS — R0902 Hypoxemia: Secondary | ICD-10-CM | POA: Diagnosis not present

## 2022-10-07 DIAGNOSIS — R609 Edema, unspecified: Secondary | ICD-10-CM | POA: Insufficient documentation

## 2022-10-07 LAB — CBC WITH DIFFERENTIAL/PLATELET
Abs Immature Granulocytes: 0.03 10*3/uL (ref 0.00–0.07)
Basophils Absolute: 0.1 10*3/uL (ref 0.0–0.1)
Basophils Relative: 1 %
Eosinophils Absolute: 0.5 10*3/uL (ref 0.0–0.5)
Eosinophils Relative: 6 %
HCT: 29.9 % — ABNORMAL LOW (ref 39.0–52.0)
Hemoglobin: 9.2 g/dL — ABNORMAL LOW (ref 13.0–17.0)
Immature Granulocytes: 0 %
Lymphocytes Relative: 9 %
Lymphs Abs: 0.7 10*3/uL (ref 0.7–4.0)
MCH: 26.1 pg (ref 26.0–34.0)
MCHC: 30.8 g/dL (ref 30.0–36.0)
MCV: 84.9 fL (ref 80.0–100.0)
Monocytes Absolute: 0.6 10*3/uL (ref 0.1–1.0)
Monocytes Relative: 8 %
Neutro Abs: 6 10*3/uL (ref 1.7–7.7)
Neutrophils Relative %: 76 %
Platelets: 440 10*3/uL — ABNORMAL HIGH (ref 150–400)
RBC: 3.52 MIL/uL — ABNORMAL LOW (ref 4.22–5.81)
RDW: 15.9 % — ABNORMAL HIGH (ref 11.5–15.5)
WBC: 7.9 10*3/uL (ref 4.0–10.5)
nRBC: 0 % (ref 0.0–0.2)

## 2022-10-07 LAB — COMPREHENSIVE METABOLIC PANEL
ALT: 6 U/L (ref 0–44)
AST: 14 U/L — ABNORMAL LOW (ref 15–41)
Albumin: 3.3 g/dL — ABNORMAL LOW (ref 3.5–5.0)
Alkaline Phosphatase: 73 U/L (ref 38–126)
Anion gap: 11 (ref 5–15)
BUN: 12 mg/dL (ref 8–23)
CO2: 25 mmol/L (ref 22–32)
Calcium: 8.8 mg/dL — ABNORMAL LOW (ref 8.9–10.3)
Chloride: 91 mmol/L — ABNORMAL LOW (ref 98–111)
Creatinine, Ser: 6.03 mg/dL — ABNORMAL HIGH (ref 0.61–1.24)
GFR, Estimated: 10 mL/min — ABNORMAL LOW (ref >60)
Glucose, Bld: 282 mg/dL — ABNORMAL HIGH (ref 70–99)
Potassium: 2.8 mmol/L — ABNORMAL LOW (ref 3.5–5.1)
Sodium: 127 mmol/L — ABNORMAL LOW (ref 135–145)
Total Bilirubin: 0.7 mg/dL (ref 0.3–1.2)
Total Protein: 8.3 g/dL — ABNORMAL HIGH (ref 6.5–8.1)

## 2022-10-07 LAB — RESP PANEL BY RT-PCR (RSV, FLU A&B, COVID)  RVPGX2
Influenza A by PCR: NEGATIVE
Influenza B by PCR: NEGATIVE
Resp Syncytial Virus by PCR: NEGATIVE
SARS Coronavirus 2 by RT PCR: NEGATIVE

## 2022-10-07 LAB — BRAIN NATRIURETIC PEPTIDE: B Natriuretic Peptide: 615.2 pg/mL — ABNORMAL HIGH (ref 0.0–100.0)

## 2022-10-07 LAB — TROPONIN I (HIGH SENSITIVITY)
Troponin I (High Sensitivity): 25 ng/L — ABNORMAL HIGH (ref <18)
Troponin I (High Sensitivity): 25 ng/L — ABNORMAL HIGH (ref <18)

## 2022-10-07 LAB — HEPATITIS B SURFACE ANTIGEN: Hepatitis B Surface Ag: NONREACTIVE

## 2022-10-07 LAB — MAGNESIUM: Magnesium: 2.6 mg/dL — ABNORMAL HIGH (ref 1.7–2.4)

## 2022-10-07 LAB — HIV ANTIBODY (ROUTINE TESTING W REFLEX): HIV Screen 4th Generation wRfx: NONREACTIVE

## 2022-10-07 MED ORDER — INSULIN ASPART 100 UNIT/ML IJ SOLN
0.0000 [IU] | Freq: Three times a day (TID) | INTRAMUSCULAR | Status: DC
  Administered 2022-10-08 (×2): 1 [IU] via SUBCUTANEOUS

## 2022-10-07 MED ORDER — INSULIN DETEMIR 100 UNIT/ML ~~LOC~~ SOLN
10.0000 [IU] | Freq: Every day | SUBCUTANEOUS | Status: DC
  Filled 2022-10-07 (×2): qty 0.1

## 2022-10-07 MED ORDER — POTASSIUM CHLORIDE CRYS ER 20 MEQ PO TBCR
40.0000 meq | EXTENDED_RELEASE_TABLET | Freq: Once | ORAL | Status: AC
  Administered 2022-10-07: 40 meq via ORAL
  Filled 2022-10-07: qty 2

## 2022-10-07 MED ORDER — LIDOCAINE HCL (PF) 1 % IJ SOLN: 5.0000 mL | INTRAMUSCULAR | Status: DC | PRN

## 2022-10-07 MED ORDER — HEPARIN SODIUM (PORCINE) 1000 UNIT/ML DIALYSIS: 20.0000 [IU]/kg | INTRAMUSCULAR | Status: DC | PRN

## 2022-10-07 MED ORDER — POTASSIUM CHLORIDE 10 MEQ/100ML IV SOLN
10.0000 meq | INTRAVENOUS | Status: AC
  Administered 2022-10-07 (×2): 10 meq via INTRAVENOUS
  Filled 2022-10-07 (×3): qty 100

## 2022-10-07 MED ORDER — HEPARIN SODIUM (PORCINE) 5000 UNIT/ML IJ SOLN: 5000.0000 [IU] | Freq: Three times a day (TID) | INTRAMUSCULAR | Status: DC

## 2022-10-07 MED ORDER — ACETAMINOPHEN 650 MG RE SUPP: 650.0000 mg | Freq: Four times a day (QID) | RECTAL | Status: DC | PRN

## 2022-10-07 MED ORDER — INSULIN ASPART 100 UNIT/ML IJ SOLN: 0.0000 [IU] | Freq: Every day | INTRAMUSCULAR | Status: DC

## 2022-10-07 MED ORDER — ONDANSETRON HCL 4 MG/2ML IJ SOLN
4.0000 mg | Freq: Four times a day (QID) | INTRAMUSCULAR | Status: DC | PRN
  Administered 2022-10-08: 4 mg via INTRAVENOUS
  Filled 2022-10-07: qty 2

## 2022-10-07 MED ORDER — HEPARIN SODIUM (PORCINE) 1000 UNIT/ML DIALYSIS
1000.0000 [IU] | INTRAMUSCULAR | Status: DC | PRN
  Filled 2022-10-07: qty 1

## 2022-10-07 MED ORDER — HYDRALAZINE HCL 20 MG/ML IJ SOLN: 10.0000 mg | Freq: Four times a day (QID) | INTRAMUSCULAR | Status: DC | PRN

## 2022-10-07 MED ORDER — CHLORHEXIDINE GLUCONATE CLOTH 2 % EX PADS
6.0000 | MEDICATED_PAD | Freq: Every day | CUTANEOUS | Status: DC
  Administered 2022-10-08: 6 via TOPICAL

## 2022-10-07 MED ORDER — PENTAFLUOROPROP-TETRAFLUOROETH EX AERO: 1.0000 | INHALATION_SPRAY | CUTANEOUS | Status: DC | PRN

## 2022-10-07 MED ORDER — LIDOCAINE-PRILOCAINE 2.5-2.5 % EX CREA: 1.0000 | TOPICAL_CREAM | CUTANEOUS | Status: DC | PRN

## 2022-10-07 MED ORDER — ACETAMINOPHEN 325 MG PO TABS: 650.0000 mg | ORAL_TABLET | Freq: Four times a day (QID) | ORAL | Status: DC | PRN

## 2022-10-07 MED ORDER — ONDANSETRON HCL 4 MG PO TABS: 4.0000 mg | ORAL_TABLET | Freq: Four times a day (QID) | ORAL | Status: DC | PRN

## 2022-10-07 MED ORDER — ALTEPLASE 2 MG IJ SOLR: 2.0000 mg | Freq: Once | INTRAMUSCULAR | Status: DC | PRN

## 2022-10-07 MED ORDER — ANTICOAGULANT SODIUM CITRATE 4% (200MG/5ML) IV SOLN: 5.0000 mL | Status: DC | PRN

## 2022-10-07 NOTE — ED Notes (Signed)
ED TO INPATIENT HANDOFF REPORT  ED Nurse Name and Phone #:  Krystol Rocco [phone]  S Name/Age/Gender Shawn [name] 62 y.o. male Room/Bed: RESUSC/RESUSC  Code Status   Code Status: Prior  Home/SNF/Other Home Patient oriented to: self, place, time, and situation Is this baseline? Yes   Triage Complete: Triage complete  Chief Complaint Acute respiratory failure with hypoxia [J96.01]  Triage Note Pt BIB EMS due to SOB. Pts room air was 80%, HTN in 180's. Pt is on dialysis. Pt arrives on 4L of O2. Pt is partially blind. Axox4.    Allergies No Known Allergies  Level of Care/Admitting Diagnosis ED Disposition     ED Disposition  Admit   Condition  --   Comment  Hospital Area: [facility] [[id number]]  Level of Care: Telemetry Medical [104]  May admit patient to [facility] or [facility] if equivalent level of care is available:: Yes  Covid Evaluation: Asymptomatic - no recent exposure (last 10 days) testing not required  Diagnosis: Acute respiratory failure with hypoxia [id number]  Admitting Physician: Bernadette Hoit [id number]  Attending Physician: Bernadette Hoit [id number]  Certification:: I certify this patient will need inpatient services for at least 2 midnights  Estimated Length of Stay: 3          B Medical/Surgery History Past Medical History:  Diagnosis Date   Anemia    Arthritis    patient denies   Blind right eye    Chronic kidney disease    Dialysis since [id number] MWF   Complication of anesthesia    Diabetes mellitus    type II   Hypertension    Legally blind    No pertinent past medical history    PONV (postoperative nausea and vomiting)    N/V- became dehydrated had to have IV fluids- 01/2015   Shortness of breath dyspnea    when has too fluid   Stroke [facility]) 2012   date per patient   Syncope    Unspecified cerebral artery occlusion with cerebral infarction 04/02/2013   Past Surgical History:  Procedure Laterality Date   AV  FISTULA PLACEMENT Right 05/12/2013   Procedure: INSERTION OF ARTERIOVENOUS (AV) GORE-TEX GRAFT ARM; ULTRASOUND GUIDED;  Surgeon: Conrad Hidalgo, MD;  Location: [facility];  Service: Vascular;  Laterality: Right;   AV FISTULA PLACEMENT Left 01/18/2015   Procedure: INSERTION OF ARTERIOVENOUS GORE-TEX GRAFT LEFT UPPER ARM;  Surgeon: Conrad [place], MD;  Location: [facility];  Service: Vascular;  Laterality: Left;   COLONOSCOPY  06/2012   EYE SURGERY Bilateral    retina surgery both eyes, cataract surgery both eyes   HERNIA REPAIR     right inguinal   INSERTION OF DIALYSIS CATHETER Right    left arm graft  10/2010   LIGATION ARTERIOVENOUS GORTEX GRAFT Left 05/03/2015   Procedure: LIGATION ARTERIOVENOUS GORTEX GRAFT-LEFT UPPER ARM;  Surgeon: Conrad [place], MD;  Location: [facility];  Service: Vascular;  Laterality: Left;     A IV Location/Drains/Wounds Patient Lines/Drains/Airways Status     Active Line/Drains/Airways     Name Placement date Placement time Site Days   Peripheral IV 10/07/22 22 G 1.75" Anterior;Right Forearm 10/07/22  1555  Forearm  less than 1   Fistula / Graft Right Upper arm Arteriovenous vein graft 05/12/13  0939  Upper arm  3435   Fistula / Graft Left Upper arm  01/18/15  0824  Upper arm  2819   Hemodialysis Catheter --  --  Subclavian  --  Hemodialysis Catheter Right Subclavian --  --  Subclavian  --   Hemodialysis Catheter Right Subclavian --  --  Subclavian  --   Incision 11/14/11 Arm Right;Upper 11/14/11  1105  -- 3980   Incision 05/12/13 Arm Right 05/12/13  0825  -- 3435   Incision (Closed) 01/18/15 Arm Left 01/18/15  0815  -- 2819   Incision (Closed) 05/03/15 Arm Left 05/03/15  0726  -- 2714            Intake/Output Last 24 hours No intake or output data in the 24 hours ending 10/07/22 1655  Labs/Imaging Results for orders placed or performed during the hospital encounter of 10/07/22 (from the past 48 hour(s))  Comprehensive metabolic panel     Status: Abnormal    Collection Time: 10/07/22  2:04 PM  Result Value Ref Range   Sodium 127 (L) 135 - 145 mmol/L   Potassium 2.8 (L) 3.5 - 5.1 mmol/L   Chloride 91 (L) 98 - 111 mmol/L   CO2 25 22 - 32 mmol/L   Glucose, Bld 282 (H) 70 - 99 mg/dL    Comment: Glucose reference range applies only to samples taken after fasting for at least 8 hours.   BUN 12 8 - 23 mg/dL   Creatinine, Ser 6.03 (H) 0.61 - 1.24 mg/dL   Calcium 8.8 (L) 8.9 - 10.3 mg/dL   Total Protein 8.3 (H) 6.5 - 8.1 g/dL   Albumin 3.3 (L) 3.5 - 5.0 g/dL   AST 14 (L) 15 - 41 U/L   ALT 6 0 - 44 U/L   Alkaline Phosphatase 73 38 - 126 U/L   Total Bilirubin 0.7 0.3 - 1.2 mg/dL   GFR, Estimated 10 (L) >60 mL/min    Comment: (NOTE) Calculated using the CKD-EPI Creatinine Equation (2021)    Anion gap 11 5 - 15    Comment: Performed at [facility], [place] [address], [place], [place] [address]  CBC with Differential/Platelet     Status: Abnormal   Collection Time: 10/07/22  2:04 PM  Result Value Ref Range   WBC 7.9 4.0 - 10.5 K/uL   RBC 3.52 (L) 4.22 - 5.81 MIL/uL   Hemoglobin 9.2 (L) 13.0 - 17.0 g/dL   HCT 29.9 (L) 39.0 - 52.0 %   MCV 84.9 80.0 - 100.0 fL   MCH 26.1 26.0 - 34.0 pg   MCHC 30.8 30.0 - 36.0 g/dL   RDW 15.9 (H) 11.5 - 15.5 %   Platelets 440 (H) 150 - 400 K/uL    Comment: REPEATED TO VERIFY   nRBC 0.0 0.0 - 0.2 %   Neutrophils Relative % 76 %   Neutro Abs 6.0 1.7 - 7.7 K/uL   Lymphocytes Relative 9 %   Lymphs Abs 0.7 0.7 - 4.0 K/uL   Monocytes Relative 8 %   Monocytes Absolute 0.6 0.1 - 1.0 K/uL   Eosinophils Relative 6 %   Eosinophils Absolute 0.5 0.0 - 0.5 K/uL   Basophils Relative 1 %   Basophils Absolute 0.1 0.0 - 0.1 K/uL   Immature Granulocytes 0 %   Abs Immature Granulocytes 0.03 0.00 - 0.07 K/uL    Comment: Performed at [facility], [place] [address], [place], [place] [address]  Troponin I (High Sensitivity)     Status: Abnormal   Collection Time: 10/07/22  2:04 PM  Result Value Ref Range   Troponin I  (High Sensitivity) 25 (H) <18 ng/L    Comment: (NOTE) Elevated  high sensitivity troponin I (hsTnI) values and significant  changes across serial measurements may suggest ACS but many other  chronic and acute conditions are known to elevate hsTnI results.  Refer to the "Links" section for chest pain algorithms and additional  guidance. Performed at [facility], [place] [address] [place], [place] [address]   Brain natriuretic peptide     Status: Abnormal   Collection Time: 10/07/22  2:04 PM  Result Value Ref Range   B Natriuretic Peptide 615.2 (H) 0.0 - 100.0 pg/mL    Comment: Performed at [facility] [address] [place], [place] [address]   DG Chest Port 1 View  Result Date: 10/07/2022 CLINICAL DATA:  Shortness of breath. EXAM: PORTABLE CHEST 1 VIEW COMPARISON:  06/15/2019 FINDINGS: Right chest wall dialysis catheter noted with tips in the right atrium. Stable cardiomediastinal contours. Small bilateral pleural effusions. Mild interstitial edema suspected. Well-circumscribed rounded density within the right midlung is favored to represent loculated fluid along the minor fissure. Decreased aeration to both lung bases may reflect atelectasis or airspace disease. IMPRESSION: 1. Small bilateral pleural effusions. 2. Suspect mild interstitial edema. 3. Decreased aeration to both lung bases may reflect atelectasis or airspace disease. Electronically Signed   By: Kerby Moors M.D.   On: 10/07/2022 13:49    Pending Labs Unresulted Labs (From admission, onward)     Start     Ordered   10/07/22 1647  Hepatitis B surface antigen  (New Admission Hemo Labs (Hepatitis B))  Once,   URGENT        10/07/22 1648   10/07/22 1647  Hepatitis B surface antibody,quantitative  (New Admission Hemo Labs (Hepatitis B))  Once,   URGENT        10/07/22 1648   10/07/22 1533  Resp panel by RT-PCR (RSV, Flu A&B, Covid) Anterior Nasal Swab  Once,   URGENT        10/07/22 1532   10/07/22 1531   Magnesium  Once,   STAT        10/07/22 1532            Vitals/Pain Today's Vitals   10/07/22 1242 10/07/22 1245 10/07/22 1301 10/07/22 1515  BP:   (!) 177/83 (!) 161/73  Pulse:   98 72  Resp:   17 20  Temp:   97.9 F (36.6 C)   TempSrc:   Oral   SpO2:   100% 100%  Weight:  81.6 kg    Height:  5\' 10"  (1.778 m)    PainSc: 3        Isolation Precautions No active isolations  Medications Medications  potassium chloride 10 mEq in 100 mL IVPB (10 mEq Intravenous New Bag/Given 10/07/22 1651)  Chlorhexidine Gluconate Cloth 2 % PADS 6 each (has no administration in time range)  potassium chloride SA (KLOR-CON M) CR tablet 40 mEq (40 mEq Oral Given 10/07/22 1640)    Mobility walks with device     Focused Assessments Pulmonary Assessment Handoff:  Lung sounds:   O2 Device: Nasal Cannula O2 Flow Rate (L/min): 4 L/min    R Recommendations: See Admitting Provider Note  Report given to:   Additional Notes:  pt A&Ox4, VS stable, potassium infusing via 22g IV in right forearm. Visually impaired, dialysis pt, portacath

## 2022-10-07 NOTE — ED Triage Notes (Signed)
Pt BIB EMS due to SOB. Pts room air was 80%, HTN in 180's. Pt is on dialysis. Pt arrives on 4L of O2. Pt is partially blind. Axox4.

## 2022-10-07 NOTE — ED Notes (Signed)
RN updated wife

## 2022-10-07 NOTE — ED Notes (Signed)
RN unsuccessful with blood work. RN asked phlebotomy to see pt.

## 2022-10-07 NOTE — ED Provider Notes (Signed)
Accepted handoff at shift change from [facility]. Please see prior provider note for full HPI.  Briefly: Patient is a 62 y.o. male who presents to the ER for shortness of breath, MWF dialysis patient, no missed treatments, does not make urine. New oxygen requirement.  DDX/Plan: Plan for admission  Physical Exam  BP (!) 161/73   Pulse 72   Temp 97.9 F (36.6 C) (Oral)   Resp 20   Ht 5\' 10"  (1.778 m)   Wt 81.6 kg   SpO2 100%   BMI 25.83 kg/m   Physical Exam Vitals and nursing note reviewed.  Constitutional:      Appearance: Normal appearance.  HENT:     Head: Normocephalic and atraumatic.  Eyes:     Conjunctiva/sclera: Conjunctivae normal.  Pulmonary:     Effort: Pulmonary effort is normal. No respiratory distress.  Skin:    General: Skin is warm and dry.  Neurological:     Mental Status: He is alert.  Psychiatric:        Mood and Affect: Mood normal.        Behavior: Behavior normal.     Results   Results for orders placed or performed during the hospital encounter of 10/07/22  Comprehensive metabolic panel  Result Value Ref Range   Sodium 127 (L) 135 - 145 mmol/L   Potassium 2.8 (L) 3.5 - 5.1 mmol/L   Chloride 91 (L) 98 - 111 mmol/L   CO2 25 22 - 32 mmol/L   Glucose, Bld 282 (H) 70 - 99 mg/dL   BUN 12 8 - 23 mg/dL   Creatinine, Ser 6.03 (H) 0.61 - 1.24 mg/dL   Calcium 8.8 (L) 8.9 - 10.3 mg/dL   Total Protein 8.3 (H) 6.5 - 8.1 g/dL   Albumin 3.3 (L) 3.5 - 5.0 g/dL   AST 14 (L) 15 - 41 U/L   ALT 6 0 - 44 U/L   Alkaline Phosphatase 73 38 - 126 U/L   Total Bilirubin 0.7 0.3 - 1.2 mg/dL   GFR, Estimated 10 (L) >60 mL/min   Anion gap 11 5 - 15  CBC with Differential/Platelet  Result Value Ref Range   WBC 7.9 4.0 - 10.5 K/uL   RBC 3.52 (L) 4.22 - 5.81 MIL/uL   Hemoglobin 9.2 (L) 13.0 - 17.0 g/dL   HCT 29.9 (L) 39.0 - 52.0 %   MCV 84.9 80.0 - 100.0 fL   MCH 26.1 26.0 - 34.0 pg   MCHC 30.8 30.0 - 36.0 g/dL   RDW 15.9 (H) 11.5 - 15.5 %   Platelets 440 (H)  150 - 400 K/uL   nRBC 0.0 0.0 - 0.2 %   Neutrophils Relative % 76 %   Neutro Abs 6.0 1.7 - 7.7 K/uL   Lymphocytes Relative 9 %   Lymphs Abs 0.7 0.7 - 4.0 K/uL   Monocytes Relative 8 %   Monocytes Absolute 0.6 0.1 - 1.0 K/uL   Eosinophils Relative 6 %   Eosinophils Absolute 0.5 0.0 - 0.5 K/uL   Basophils Relative 1 %   Basophils Absolute 0.1 0.0 - 0.1 K/uL   Immature Granulocytes 0 %   Abs Immature Granulocytes 0.03 0.00 - 0.07 K/uL  Brain natriuretic peptide  Result Value Ref Range   B Natriuretic Peptide 615.2 (H) 0.0 - 100.0 pg/mL  Troponin I (High Sensitivity)  Result Value Ref Range   Troponin I (High Sensitivity) 25 (H) <18 ng/L   DG Chest Port 1 View  Result Date:  10/07/2022 CLINICAL DATA:  Shortness of breath. EXAM: PORTABLE CHEST 1 VIEW COMPARISON:  06/15/2019 FINDINGS: Right chest wall dialysis catheter noted with tips in the right atrium. Stable cardiomediastinal contours. Small bilateral pleural effusions. Mild interstitial edema suspected. Well-circumscribed rounded density within the right midlung is favored to represent loculated fluid along the minor fissure. Decreased aeration to both lung bases may reflect atelectasis or airspace disease. IMPRESSION: 1. Small bilateral pleural effusions. 2. Suspect mild interstitial edema. 3. Decreased aeration to both lung bases may reflect atelectasis or airspace disease. Electronically Signed   By: Kerby Moors M.D.   On: 10/07/2022 13:49    ED Course / MDM    Medical Decision Making Amount and/or Complexity of Data Reviewed Labs: ordered. Radiology: ordered.  Risk Prescription drug management.  Initial troponin 25, delta troponin pending. Potassium 2.8, replacement ordered via IV and PO. Magnesium level pending. Respiratory panel pending.  BNP today 615.2, could be compounded by CKD. Per chart review, echocardiogram from 2019 shows EF 45-50%.  CXR with small bilateral pleural effusions. Will require admission for acute  respiratory failure with hypoxia, requiring 4 L [place].   I consulted with hospitalist Dr Josephine Cables, who recommended consultation with nephrology prior to admission.  I consulted with nephrologist Dr Moshe Cipro who will try to get patient in for dialysis this evening.  Updated hospitalist who will admit.   The patient appears reasonably stabilized for admission considering the current resources, flow, and capabilities available in the ED at this time, and I doubt any other [facility] requiring further screening and/or treatment in the ED prior to admission.   Kateri Plummer, PA-C 10/07/22 [place], [place], DO 10/11/22 [phone]

## 2022-10-07 NOTE — H&P (Signed)
History and Physical    Patient: Shawn [name] [id number] DOB: 1961/05/03 DOA: 10/07/2022 DOS: the patient was seen and examined on 10/07/2022 PCP: Vincente Liberty, MD  Patient coming from: Home  Chief Complaint:  Chief Complaint  Patient presents with   Shortness of Breath   HPI: Shawn [name] is a 62 y.o. male with medical history significant of ESRD on dialysis (MWF), CVA, anemia of chronic disease, legally blind who presents to the emergency department from home via EMS due to increased shortness of breath.  He complained of increasing shortness of breath that has been ongoing for several weeks, but this worsened today.  He complained of difficulty in being able to lay on his side due to increased shortness of breath and mild nonproductive cough.  EMS was activated, on arrival of EMS team, patient was noted to have O2 sat of 80% and blood pressure was elevated with SBP in the 180s.  Last dialysis was on Friday (3/29).  Supplemental oxygen at 4 LPM was provided by EMS team and patient was sent to the ED for further evaluation and management.  Patient denies fever, chills, nausea, vomiting, dizziness, leg swelling, abdominal pain.  ED Course:  In the emergency department, BP was 177/83, other vital signs were within normal range.  Workup in the ED showed normocytic anemia, thrombocytosis.  BMP showed sodium 127, potassium 2.8, chloride 91, bicarb 25, blood glucose 282, BUN 12, creatinine 6.03, albumin 3.3, troponin was flat at 25, BNP 615.2, magnesium 2.6.  Influenza A, B, SARS coronavirus 2, RSV was negative. Chest x-ray showed small bilateral pleural effusions.  Suspect mild interstitial edema.  Decreased aeration to both lung bases may reflect atelectasis or airspace disease. Potassium was replenished. Nephrologist (Dr. Moshe Cipro) was consulted and plan for HD today with plan to discharge patient this evening if clinically stable after dialysis and to follow-up on  outpatient HD tomorrow.  Review of Systems: Review of systems as noted in the HPI. All other systems reviewed and are negative.   Past Medical History:  Diagnosis Date   Anemia    Arthritis    patient denies   Blind right eye    Chronic kidney disease    Dialysis since [id number] MWF   Complication of anesthesia    Diabetes mellitus    type II   Hypertension    Legally blind    No pertinent past medical history    PONV (postoperative nausea and vomiting)    N/V- became dehydrated had to have IV fluids- 01/2015   Shortness of breath dyspnea    when has too fluid   Stroke [facility]) 2012   date per patient   Syncope    Unspecified cerebral artery occlusion with cerebral infarction 04/02/2013   Past Surgical History:  Procedure Laterality Date   AV FISTULA PLACEMENT Right 05/12/2013   Procedure: INSERTION OF ARTERIOVENOUS (AV) GORE-TEX GRAFT ARM; ULTRASOUND GUIDED;  Surgeon: Conrad Lake Jackson, MD;  Location: [place];  Service: Vascular;  Laterality: Right;   AV FISTULA PLACEMENT Left 01/18/2015   Procedure: INSERTION OF ARTERIOVENOUS GORE-TEX GRAFT LEFT UPPER ARM;  Surgeon: Conrad Tuckerman, MD;  Location: [place];  Service: Vascular;  Laterality: Left;   COLONOSCOPY  06/2012   EYE SURGERY Bilateral    retina surgery both eyes, cataract surgery both eyes   HERNIA REPAIR     right inguinal   INSERTION OF DIALYSIS CATHETER Right    left arm graft  10/2010   LIGATION ARTERIOVENOUS GORTEX GRAFT  Left 05/03/2015   Procedure: LIGATION ARTERIOVENOUS GORTEX GRAFT-LEFT UPPER ARM;  Surgeon: Conrad Hewitt, MD;  Location: [place];  Service: Vascular;  Laterality: Left;    Social History:  reports that he has never smoked. He has never used smokeless tobacco. He reports that he does not drink alcohol and does not use drugs.   No Known Allergies  Family History  Problem Relation Age of Onset   Diabetes Mother    Alzheimer's disease Mother    Cancer Father    Heart disease Father    Cancer Sister    Stroke  Sister      Prior to Admission medications   Medication Sig Start Date End Date Taking? Authorizing Provider  amLODipine (NORVASC) 5 MG tablet Take by mouth. 07/11/22   [provider]  aspirin EC 325 MG tablet Take 325 mg by mouth at bedtime.    [provider]  b complex vitamins tablet Take 1 tablet by mouth at bedtime.    [provider]  brimonidine (ALPHAGAN) 0.15 % ophthalmic solution  12/23/19   [provider]  brimonidine (ALPHAGAN) 0.2 % ophthalmic solution 1 drop 2 (two) times daily. 03/31/20   [provider]  calcitRIOL (ROCALTROL) 0.5 MCG capsule Take 1 capsule (0.5 mcg total) by mouth 3 (three) times a week. 06/03/19   Aline August, MD  diphenhydramine-acetaminophen (TYLENOL PM) 25-500 MG TABS tablet Take 1 tablet by mouth at bedtime as needed (pain).    [provider]  dorzolamide-timolol (COSOPT) 22.3-6.8 MG/ML ophthalmic solution Place 1 drop into both eyes 2 (two) times daily.    [provider]  Doxercalciferol (HECTOROL IV) Doxercalciferol (Hectorol) 02/02/22 02/01/23  [provider]  DROPLET PEN NEEDLES 31G X 6 MM [place]  03/30/20   [provider]  erythromycin ophthalmic ointment Apply to eye as needed. 12/01/20   [provider]  hydrOXYzine (ATARAX/VISTARIL) 10 MG tablet Take 10 mg by mouth daily as needed for itching.  09/07/19   [provider]  lanthanum (FOSRENOL) 1000 MG chewable tablet Chew 1,000 mg by mouth 3 (three) times daily with meals.    [provider]  latanoprost (XALATAN) 0.005 % ophthalmic solution Place 1 drop into both eyes at bedtime.     [provider]  LEVEMIR FLEXTOUCH 100 UNIT/ML FlexPen Inject into the skin 2 (two) times daily. 03/30/20   [provider]  lisinopril (ZESTRIL) 5 MG tablet Take 5 mg by mouth daily. 07/23/22   [provider]  rosuvastatin (CRESTOR) 40 MG tablet Take 40 mg by mouth daily. 02/19/22    [provider]  VELPHORO 500 MG chewable tablet Chew 1,000 mg by mouth 3 (three) times daily. 12/04/21   [provider]    Physical Exam: BP (!) 180/80 (BP Location: Left Arm)   Pulse 84   Temp 98.7 F (37.1 C) (Oral)   Resp (!) 21   Ht 5\' 10"  (1.778 m)   Wt 81.6 kg   SpO2 94%   BMI 25.83 kg/m   General: 62 y.o. year-old male ill appearing, but in no acute distress.  Alert and oriented x3. HEENT: NCAT, moist mucous membrane Neck: Supple, trachea medial Cardiovascular: Regular rate and rhythm with no rubs or gallops.  No thyromegaly or JVD noted.  No lower extremity edema. 2/4 pulses in all 4 extremities. Respiratory: Decreased breath sounds and mild rales in lower lobes bilaterally.   Abdomen: Soft, nontender nondistended with normal bowel sounds x4 quadrants. Muskuloskeletal:  No cyanosis, clubbing or edema noted bilaterally Neuro: CN II-XII intact, strength 5/5 x 4, sensation, reflexes intact Skin: No ulcerative lesions noted or rashes Psychiatry: Judgement and insight appear normal. Mood is appropriate for condition and setting          Labs on Admission:  Basic Metabolic Panel: Recent Labs  Lab 10/07/22 1404 10/07/22 1556  NA 127*  --   K 2.8*  --   CL 91*  --   CO2 25  --   GLUCOSE 282*  --   BUN 12  --   CREATININE 6.03*  --   CALCIUM 8.8*  --   MG  --  2.6*   Liver Function Tests: Recent Labs  Lab 10/07/22 1404  AST 14*  ALT 6  ALKPHOS 73  BILITOT 0.7  PROT 8.3*  ALBUMIN 3.3*   No results for input(s): "LIPASE", "AMYLASE" in the last 168 hours. No results for input(s): "AMMONIA" in the last 168 hours. CBC: Recent Labs  Lab 10/07/22 1404  WBC 7.9  NEUTROABS 6.0  HGB 9.2*  HCT 29.9*  MCV 84.9  PLT 440*   Cardiac Enzymes: No results for input(s): "CKTOTAL", "CKMB", "CKMBINDEX", "TROPONINI" in the last 168 hours.  BNP (last 3 results) Recent Labs    10/07/22 1404  BNP 615.2*    ProBNP (last 3 results) No results for  input(s): "PROBNP" in the last 8760 hours.  CBG: No results for input(s): "GLUCAP" in the last 168 hours.  Radiological Exams on Admission: DG Chest Port 1 View  Result Date: 10/07/2022 CLINICAL DATA:  Shortness of breath. EXAM: PORTABLE CHEST 1 VIEW COMPARISON:  06/15/2019 FINDINGS: Right chest wall dialysis catheter noted with tips in the right atrium. Stable cardiomediastinal contours. Small bilateral pleural effusions. Mild interstitial edema suspected. Well-circumscribed rounded density within the right midlung is favored to represent loculated fluid along the minor fissure. Decreased aeration to both lung bases may reflect atelectasis or airspace disease. IMPRESSION: 1. Small bilateral pleural effusions. 2. Suspect mild interstitial edema. 3. Decreased aeration to both lung bases may reflect atelectasis or airspace disease. Electronically Signed   By: Kerby Moors M.D.   On: 10/07/2022 13:49    EKG: I independently viewed the EKG done and my findings are as followed: Normal sinus rhythm at a rate of 82 bpm with APCs  Assessment/Plan Present on Admission:  Acute respiratory failure with hypoxia ([place])  Hypokalemia  Essential hypertension  Mixed hyperlipidemia  Principal Problem:   Acute respiratory failure with hypoxia ([place]) Active Problems:   End-stage renal disease on hemodialysis (HCC)   Essential hypertension   Mixed hyperlipidemia   Hypokalemia   Bilateral pleural effusion   Interstitial edema   Uncontrolled type 2 diabetes mellitus with hyperglycemia, with long-term current use of insulin (HCC)   Hyponatremia   Hypermagnesemia   Thrombocytosis   Elevated troponin  Acute respiratory failure with hypoxia possibly secondary to bilateral pleural effusion mild interstitial edema 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 Continue total input/output, daily weights and fluid restriction Continue heart healthy/modified carb diet  Echocardiogram done on 12/22/2017 showed LVEF of 45 to 50% and G1 DD.  Echocardiogram will be done Nephrologist (Dr. Moshe Cipro) was consulted and plan to do dialysis on patient tonight.  Nephrology consult appreciated  ESRD on HD (MWF) Hemodialysis planned to be done on patient's  tonight per nephrologist. Continue Fosrenol and Velphoro  Hyponatremia possibly due to fluid overload Hemodialysis as described above  Hypokalemia K+ 2.8, this was replenished, this will be further corrected on dialysis  Thrombocytosis possibly reactive Platelets 440, continue to monitor platelet levels  Elevated troponin possibly secondary to type II demand ischemia Troponin x 2 was flat at 25.  Hypermagnesemia Magnesium 2.6, this will be corrected during dialysis  Essential hypertension Continue amlodipine Continue IV hydralazine 10 mg every 6 hours as needed for SBP > 170  Mixed hyperlipidemia Continue Crestor  Type 2 diabetes mellitus with uncontrolled hyperglycemia Continue Levemir 10 units nightly and adjust dose accordingly Continue ISS and hypoglycemia protocol  DVT prophylaxis: Heparin subcu  Code Status: Full code  Consults: Nephrologist  Family Communication: None at bedside  Severity of Illness: The appropriate patient status for this patient is INPATIENT. Inpatient status is judged to be reasonable and necessary in order to provide the required intensity of service to ensure the patient's safety. The patient's presenting symptoms, physical exam findings, and initial radiographic and laboratory data in the context of their chronic comorbidities is felt to place them at high risk for further clinical deterioration. Furthermore, it is not anticipated that the patient will be medically stable for discharge from the hospital within 2 midnights of admission.   * I  certify that at the point of admission it is my clinical judgment that the patient will require inpatient hospital care spanning beyond 2 midnights from the point of admission due to high intensity of service, high risk for further deterioration and high frequency of surveillance required.*  Author: Bernadette Hoit, DO 10/07/2022 7:27 PM  For on call review [email or web address].

## 2022-10-07 NOTE — Progress Notes (Signed)
Was called about this ESRD pt who presented with SOB- he is compliant with his HD MWF at [place]-  actually left our under his EDW on Wed and Friday with high BP  Plan will be to do OP HD today with goal removal of 3-4 liters-  then if symptomatically improved he could possibly be discharged this evening but should go to his OP HD on Monday as scheduled-  will contact them at [facility]  to lower his [place]

## 2022-10-07 NOTE — ED Provider Notes (Signed)
Shawn [name] EMERGENCY DEPARTMENT AT [facility] Provider Note   CSN: [id number] Arrival date & time: 10/07/22  1237     History  Chief Complaint  Patient presents with   Shortness of Breath    Shawn [name] is a 62 y.o. male with past medical history significant for hypertension, ESRD on dialysis MWF, CVA, DM, anemia of chronic disease, legally blind presents to the ED by EMS due to shortness of breath.  Patient's room air saturation was found to be 80% and he was hypertensive with systolic blood pressure in the 180s.  Patient is on dialysis and was last dialyzed on Friday.  He reports it was unremarkable.  Patient arrives on 4 L of O2.  Patient does not have prior oxygen requirement.  He reports that he has had a cough for the past couple of weeks and has been dealing with hypertension for "a while".  He reports that his cough is worse when he lies flat.  Patient has dyspnea on exertion.  Denies fever, chills, other URI symptoms, nausea, vomiting, diarrhea, abdominal pain, syncope, lightheadedness, dizziness.       Home Medications Prior to Admission medications   Medication Sig Start Date End Date Taking? Authorizing Provider  amLODipine (NORVASC) 5 MG tablet Take by mouth. 07/11/22   [provider]  aspirin EC 325 MG tablet Take 325 mg by mouth at bedtime.    [provider]  b complex vitamins tablet Take 1 tablet by mouth at bedtime.    [provider]  brimonidine (ALPHAGAN) 0.15 % ophthalmic solution  12/23/19   [provider]  brimonidine (ALPHAGAN) 0.2 % ophthalmic solution 1 drop 2 (two) times daily. 03/31/20   [provider]  calcitRIOL (ROCALTROL) 0.5 MCG capsule Take 1 capsule (0.5 mcg total) by mouth 3 (three) times a week. 06/03/19   Glade Lloyd, MD  diphenhydramine-acetaminophen (TYLENOL PM) 25-500 MG TABS tablet Take 1 tablet by mouth at bedtime as needed (pain).    [provider]   dorzolamide-timolol (COSOPT) 22.3-6.8 MG/ML ophthalmic solution Place 1 drop into both eyes 2 (two) times daily.    [provider]  Doxercalciferol (HECTOROL IV) Doxercalciferol (Hectorol) 02/02/22 02/01/23  [provider]  DROPLET PEN NEEDLES 31G X 6 MM [place]  03/30/20   [provider]  erythromycin ophthalmic ointment Apply to eye as needed. 12/01/20   [provider]  hydrOXYzine (ATARAX/VISTARIL) 10 MG tablet Take 10 mg by mouth daily as needed for itching.  09/07/19   [provider]  lanthanum (FOSRENOL) 1000 MG chewable tablet Chew 1,000 mg by mouth 3 (three) times daily with meals.    [provider]  latanoprost (XALATAN) 0.005 % ophthalmic solution Place 1 drop into both eyes at bedtime.     [provider]  LEVEMIR FLEXTOUCH 100 UNIT/ML FlexPen Inject into the skin 2 (two) times daily. 03/30/20   [provider]  lisinopril (ZESTRIL) 5 MG tablet Take 5 mg by mouth daily. 07/23/22   [provider]  rosuvastatin (CRESTOR) 40 MG tablet Take 40 mg by mouth daily. 02/19/22   [provider]  VELPHORO 500 MG chewable tablet Chew 1,000 mg by mouth 3 (three) times daily. 12/04/21   [provider]      Allergies    Patient has no known allergies.    Review of Systems   Review of Systems  Constitutional:  Negative for fever.  HENT:  Negative for congestion, rhinorrhea and sore throat.  Respiratory:  Positive for cough and shortness of breath.   Gastrointestinal:  Negative for abdominal pain, diarrhea, nausea and vomiting.  Neurological:  Negative for dizziness, syncope and light-headedness.    Physical Exam Updated Vital Signs BP (!) 161/73   Pulse 72   Temp 97.9 F (36.6 C) (Oral)   Resp 20   Ht 5\' 10"  (1.778 m)   Wt 81.6 kg   SpO2 100%   BMI 25.83 kg/m  Physical Exam Vitals and nursing note reviewed.  Constitutional:      General: He is not in acute distress.    Appearance: He  is ill-appearing. He is not toxic-appearing or diaphoretic.  HENT:     Mouth/Throat:     Mouth: Mucous membranes are moist.     Pharynx: Oropharynx is clear.  Cardiovascular:     Rate and Rhythm: Normal rate and regular rhythm.     Pulses: Normal pulses.     Heart sounds: Normal heart sounds.  Pulmonary:     Effort: Pulmonary effort is normal. No tachypnea, accessory muscle usage or respiratory distress.     Breath sounds: Normal air entry. Decreased breath sounds present.     Comments: Decreased breath sounds in bilateral lung bases.  Patient currently on 4 L oxygen during exam.  SpO2 100% at time of assessment. Abdominal:     General: Abdomen is flat. Bowel sounds are normal. There is no distension.     Palpations: Abdomen is soft.     Tenderness: There is no abdominal tenderness.  Skin:    General: Skin is warm and dry.     Capillary Refill: Capillary refill takes less than 2 seconds.  Neurological:     Mental Status: He is alert. Mental status is at baseline.  Psychiatric:        Mood and Affect: Mood normal.        Behavior: Behavior normal.     ED Results / Procedures / Treatments   Labs (all labs ordered are listed, but only abnormal results are displayed) Labs Reviewed  COMPREHENSIVE METABOLIC PANEL - Abnormal; Notable for the following components:      Result Value   Sodium 127 (*)    Potassium 2.8 (*)    Chloride 91 (*)    Glucose, Bld 282 (*)    Creatinine, Ser 6.03 (*)    Calcium 8.8 (*)    Total Protein 8.3 (*)    Albumin 3.3 (*)    AST 14 (*)    GFR, Estimated 10 (*)    All other components within normal limits  CBC WITH DIFFERENTIAL/PLATELET - Abnormal; Notable for the following components:   RBC 3.52 (*)    Hemoglobin 9.2 (*)    HCT 29.9 (*)    RDW 15.9 (*)    Platelets 440 (*)    All other components within normal limits  BRAIN NATRIURETIC PEPTIDE - Abnormal; Notable for the following components:   B Natriuretic Peptide 615.2 (*)    All other  components within normal limits  TROPONIN I (HIGH SENSITIVITY) - Abnormal; Notable for the following components:   Troponin I (High Sensitivity) 25 (*)    All other components within normal limits  RESP PANEL BY RT-PCR (RSV, FLU A&B, COVID)  RVPGX2  MAGNESIUM  TROPONIN I (HIGH SENSITIVITY)    EKG EKG Interpretation  Date/Time:  Sunday October 07 2022 12:53:57 EDT Ventricular Rate:  82 PR Interval:  132 QRS Duration: 102 QT Interval:  389 QTC Calculation: 455  R Axis:   34 Text Interpretation: Sinus rhythm Atrial premature complex Nonspecific T abnormalities, lateral leads Confirmed by Gloris Manchester [phone]) on 10/07/2022 3:52:58 PM  Radiology DG Chest Port 1 View  Result Date: 10/07/2022 CLINICAL DATA:  Shortness of breath. EXAM: PORTABLE CHEST 1 VIEW COMPARISON:  06/15/2019 FINDINGS: Right chest wall dialysis catheter noted with tips in the right atrium. Stable cardiomediastinal contours. Small bilateral pleural effusions. Mild interstitial edema suspected. Well-circumscribed rounded density within the right midlung is favored to represent loculated fluid along the minor fissure. Decreased aeration to both lung bases may reflect atelectasis or airspace disease. IMPRESSION: 1. Small bilateral pleural effusions. 2. Suspect mild interstitial edema. 3. Decreased aeration to both lung bases may reflect atelectasis or airspace disease. Electronically Signed   By: Signa Kell M.D.   On: 10/07/2022 13:49    Procedures Procedures    Medications Ordered in ED Medications  potassium chloride 10 mEq in 100 mL IVPB (has no administration in time range)  potassium chloride SA (KLOR-CON M) CR tablet 40 mEq (has no administration in time range)    ED Course/ Medical Decision Making/ A&P                             Medical Decision Making Amount and/or Complexity of Data Reviewed Labs: ordered. Radiology: ordered.  Risk Prescription drug management.   This patient presents to the ED with  chief complaint(s) of shortness of breath worse with exertion, cough, hypertension with pertinent past medical history of hypertension, ESRD on dialysis, diabetes, CVA, anemia.  The complaint involves an extensive differential diagnosis and also carries with it a high risk of complications and morbidity.    The differential diagnosis includes new onset heart failure, ACS, PE, pneumonia, acute bronchitis, fluid overload  The initial plan is to obtain chest pain workup including BNP to assess for possible heart failure  Additional history obtained: Additional history obtained from EMS , reports that patient had room air SPO2 of 80%.  Patient started on oxygen therapy en route to hospital.   Records reviewed  patient spoke with his cardiologist on 10/05/2022 about shortness of breath and hypertension.  Patient reports that when he walks or moves around he starts breathing hard.  Patient has been unable to check his blood pressure at home.  Patient reported during that phone call that he is at his dry weight.  Initial Assessment:   Exam significant for an ill-appearing patient who is currently on oxygen.  He is not in acute respiratory distress.  He does have reduced lung sounds in bilateral bases without appreciated adventitious breath sounds.  Patient is not tachypneic.  Heart rate is normal in the 70s with regular rhythm.  He does not appear to be fluid overloaded.  Abdomen is not distended and is nontender to palpation.  Skin is warm and dry.  Independent ECG/labs interpretation:  The following labs were independently interpreted:  CBC with anemia, no leukocytosis.  Metabolic panel with multiple electrolyte disturbances including hyponatremia, hypokalemia.  Glucose is elevated.  Patient last dialyzed on Friday.  He reports that he does not make urine. Initial troponin 25.  BNP 615.  Independent visualization and interpretation of imaging: I independently visualized the following imaging with scope  of interpretation limited to determining acute life threatening conditions related to emergency care: Chest x-ray, which revealed interstitial edema, small bilateral pleural effusions, decreased aeration in lung bases.    Treatment and Reassessment:  Patient will continue to be on oxygen therapy.  Patient has new oxygen requirement and has multiple comorbidities.  I suspect that patient will require hospital admission due to this and may require further workup.  Disposition:   Patient care was transferred to Lauren Roemhildt, PA-C at shift change.  Plan at this time is to admit patient due to new oxygen requirements with evidence of pulmonary changes.           Final Clinical Impression(s) / ED Diagnoses Final diagnoses:  Acute respiratory failure with hypoxia [facility])  Dialysis patient [facility])    Rx / DC Orders ED Discharge Orders     None         Lenard Simmer, PA-C 10/07/22 1608    Gloris Manchester, MD 10/10/22 [phone]

## 2022-10-07 NOTE — Progress Notes (Addendum)
Pt arrived to [place] at approximately 1732, from the ED via stretcher. Pt is alert and oriented x4, denies pain, and is room air at baseline, but is currently on [facility], weaned down from [facility] in the ED. Pt does endorse dyspnea with exertion. Pt is ambulatory with a cane, x1 assist. Pt on hemodialysis M-W-F, and is anuric. Last BM per pt was on 10/06/2022. Skin is intact. Pt is NSR on telemetry, with absence of edema. Pt in NAD at this time.

## 2022-10-08 DIAGNOSIS — Z794 Long term (current) use of insulin: Secondary | ICD-10-CM

## 2022-10-08 DIAGNOSIS — Z992 Dependence on renal dialysis: Secondary | ICD-10-CM | POA: Diagnosis not present

## 2022-10-08 DIAGNOSIS — E1165 Type 2 diabetes mellitus with hyperglycemia: Secondary | ICD-10-CM

## 2022-10-08 DIAGNOSIS — J9601 Acute respiratory failure with hypoxia: Secondary | ICD-10-CM | POA: Diagnosis not present

## 2022-10-08 DIAGNOSIS — I10 Essential (primary) hypertension: Secondary | ICD-10-CM | POA: Diagnosis not present

## 2022-10-08 DIAGNOSIS — I120 Hypertensive chronic kidney disease with stage 5 chronic kidney disease or end stage renal disease: Secondary | ICD-10-CM | POA: Diagnosis not present

## 2022-10-08 DIAGNOSIS — N186 End stage renal disease: Secondary | ICD-10-CM | POA: Diagnosis not present

## 2022-10-08 LAB — CBC
HCT: 26.5 % — ABNORMAL LOW (ref 39.0–52.0)
Hemoglobin: 8.4 g/dL — ABNORMAL LOW (ref 13.0–17.0)
MCH: 26.1 pg (ref 26.0–34.0)
MCHC: 31.7 g/dL (ref 30.0–36.0)
MCV: 82.3 fL (ref 80.0–100.0)
Platelets: 416 10*3/uL — ABNORMAL HIGH (ref 150–400)
RBC: 3.22 MIL/uL — ABNORMAL LOW (ref 4.22–5.81)
RDW: 16 % — ABNORMAL HIGH (ref 11.5–15.5)
WBC: 7.8 10*3/uL (ref 4.0–10.5)
nRBC: 0 % (ref 0.0–0.2)

## 2022-10-08 LAB — GLUCOSE, CAPILLARY
Glucose-Capillary: 135 mg/dL — ABNORMAL HIGH (ref 70–99)
Glucose-Capillary: 130 mg/dL — ABNORMAL HIGH (ref 70–99)
Glucose-Capillary: 154 mg/dL — ABNORMAL HIGH (ref 70–99)
Glucose-Capillary: 200 mg/dL — ABNORMAL HIGH (ref 70–99)

## 2022-10-08 LAB — COMPREHENSIVE METABOLIC PANEL
ALT: <5 U/L (ref 0–44)
AST: 12 U/L — ABNORMAL LOW (ref 15–41)
Albumin: 2.8 g/dL — ABNORMAL LOW (ref 3.5–5.0)
Alkaline Phosphatase: 64 U/L (ref 38–126)
Anion gap: 9 (ref 5–15)
BUN: 6 mg/dL — ABNORMAL LOW (ref 8–23)
CO2: 26 mmol/L (ref 22–32)
Calcium: 8 mg/dL — ABNORMAL LOW (ref 8.9–10.3)
Chloride: 96 mmol/L — ABNORMAL LOW (ref 98–111)
Creatinine, Ser: 3.58 mg/dL — ABNORMAL HIGH (ref 0.61–1.24)
GFR, Estimated: 19 mL/min — ABNORMAL LOW (ref >60)
Glucose, Bld: 150 mg/dL — ABNORMAL HIGH (ref 70–99)
Potassium: 3.8 mmol/L (ref 3.5–5.1)
Sodium: 131 mmol/L — ABNORMAL LOW (ref 135–145)
Total Bilirubin: 0.8 mg/dL (ref 0.3–1.2)
Total Protein: 7.4 g/dL (ref 6.5–8.1)

## 2022-10-08 LAB — MAGNESIUM: Magnesium: 2.3 mg/dL (ref 1.7–2.4)

## 2022-10-08 LAB — HEMOGLOBIN A1C
Hgb A1c MFr Bld: 6.6 % — ABNORMAL HIGH (ref 4.8–5.6)
Mean Plasma Glucose: 143 mg/dL

## 2022-10-08 LAB — PHOSPHORUS: Phosphorus: 1.3 mg/dL — ABNORMAL LOW (ref 2.5–4.6)

## 2022-10-08 MED ORDER — POTASSIUM PHOSPHATES 15 MMOLE/5ML IV SOLN
30.0000 mmol | Freq: Once | INTRAVENOUS | Status: AC
  Administered 2022-10-08: 30 mmol via INTRAVENOUS
  Filled 2022-10-08: qty 10

## 2022-10-08 NOTE — Progress Notes (Signed)
Asked to see patient for HD.  Pt presented to ED w/ SOB yesterday afternoon. CXR showed early IS edema. Renal team was called and HD orders were given for HD last night. Pt dialyzed from 11 pm to 2-3am this morning. UF was 4500 cc. Pt seen post HD and states he has been getting to dialysis w/ "not much weight on". He endorses poor appetite. His pre HD wt last night was 82.5kg and post wt he says was 78kg.    OP HD: [facility]  3h 57min  400/1.5   82.7kg  2K/ 3Ca bath  Heparin 6500  TDC - last 3 pre HD wts were 83.8, 83.8 and 82.8 kg, coming off 0.5kg under dry wt   Pt seen post HD, states his breathing is "much better" and that he weighed about 82.5kg pre HD last night and post HD was about 78kg.  This goes along w/ 4500 cc fluid pulled off. Pt likely has lost significant amount of dry wt related to poor appetite. He looks euvolemic today post HD and okay for dc.  PMD will give Kphos 4 doses over 2 days due to his low Phos and low K+.  These are also related to poor appetite/ low food intake.  Don't see a need for further HD here, pt can be dc'd. We will reset/ lower his dry weight to 78kg and pt will get his next HD as outpt on Wed 4/3.  We might want to do a 0.5 kg challenge w/ each of his next 3 dialysis sessions, to further challenge dry wt.   Have d/w pmd. OK for dc.   Kelly Splinter, MD 10/08/2022, 9:52 AM  Recent Labs  Lab 10/07/22 1404 10/08/22 0803  HGB 9.2* 8.4*  ALBUMIN 3.3* 2.8*  CALCIUM 8.8* 8.0*  PHOS  --  1.3*  CREATININE 6.03* 3.58*  K 2.8* 3.8    Inpatient medications:  Chlorhexidine Gluconate Cloth  6 each Topical Q0600   heparin  5,000 Units Subcutaneous Q8H   insulin aspart  0-5 Units Subcutaneous QHS   insulin aspart  0-6 Units Subcutaneous TID WC   insulin detemir  10 Units Subcutaneous QHS    potassium PHOSPHATE IVPB (in mmol)     acetaminophen **OR** acetaminophen, hydrALAZINE, ondansetron **OR** ondansetron (ZOFRAN) IV

## 2022-10-08 NOTE — Telephone Encounter (Signed)
Pt currently admitted for SOB

## 2022-10-08 NOTE — TOC Transition Note (Signed)
Transition of Care [facility]) - CM/SW Discharge Note   Patient Details  Name: Shawn [name] MRN: [id number] Date of Birth: 1961/01/19  Transition of Care [facility]) CM/SW Contact:  Levonne Lapping, RN Phone Number: 10/08/2022, 10:27 AM   Clinical Narrative:     Patient to dc to home today.  He will need a cab voucher- One is prepared and given to patient's RN.  Patient was asking about a new RW but he received one 3 1/2c years ago so Medicare will NOT  pay for one .  Patient has HD Mon/Wed/Fri and has transportation arranged.  No additional TOC needs         Patient Goals and CMS Choice      Discharge Placement                         Discharge Plan and Services Additional resources added to the After Visit Summary for                                       Social Determinants of Health (SDOH) Interventions SDOH Screenings   Food Insecurity: No Food Insecurity (10/07/2022)  Housing: Low Risk  (10/07/2022)  Transportation Needs: No Transportation Needs (10/07/2022)  Utilities: Not At Risk (10/07/2022)  Tobacco Use: Low Risk  (10/07/2022)     Readmission Risk Interventions     No data to display

## 2022-10-08 NOTE — Progress Notes (Signed)
Received patient in bed to unit.  Alert and oriented.  Informed consent signed and in chart.   TX duration: 3.45  Patient tolerated well.  Transported back to the room  Alert, without acute distress.  Hand-off given to patient's nurse.   Access used: catheter Access issues: none  Total UF removed: 4500 ml Medication(s) given: none Post HD VS: 143/69 Post HD weight: 77.8 kg     10/08/22 0312  Vitals  Temp 98.3 F (36.8 C)  Temp Source Oral  BP (!) 143/69  MAP (mmHg) 92  BP Location Left Arm  BP Method Automatic  Patient Position (if appropriate) Lying  Pulse Rate 84  Pulse Rate Source Monitor  ECG Heart Rate 84  Resp (!) 22  Oxygen Therapy  SpO2 100 %  O2 Device Nasal Cannula  O2 Flow Rate (L/min) 2 L/min  Patient Activity (if Appropriate) In bed  Pulse Oximetry Type Continuous  Post Treatment  Dialyzer Clearance Lightly streaked  Duration of HD Treatment -hour(s) 3.45 hour(s)  Hemodialysis Intake (mL) 0 mL  Liters Processed 83.8  Fluid Removed (mL) 4500 mL  Tolerated HD Treatment Yes  Post-Hemodialysis Comments HD tx achieved as expected without complications, tolerated, pt is stable.  AVG/AVF Arterial Site Held (minutes) 0 minutes  AVG/AVF Venous Site Held (minutes) 0 minutes  Note  Observations pt is alert, oriented, verbally responsive, no complaints, and stable  Hemodialysis Catheter Right Subclavian  No Placement Date or Time found.   Placed prior to admission: Yes  Orientation: Right  Access Location: Subclavian  Site Condition No complications  Blue Lumen Status Flushed;Heparin locked;Dead end cap in place  Red Lumen Status Flushed;Heparin locked;Dead end cap in place  Purple Lumen Status N/A  Catheter fill solution Heparin 1000 units/ml  Catheter fill volume (Arterial) 1.6 cc  Catheter fill volume (Venous) 1.6  Dressing Type Transparent  Dressing Status Antimicrobial disc in place  Interventions New dressing  Drainage Description None   Dressing Change Due 10/14/22  Post treatment catheter status Capped and Clamped

## 2022-10-08 NOTE — Care Management CC44 (Addendum)
Condition Code 44 Documentation Completed  Patient Details  Name: Shawn [name] MRN: [id number] Date of Birth: August 17, 1960   Condition Code 44 given:  Yes Patient signature on Condition Code 44 notice:  Yes Documentation of 2 MD's agreement:    Code 44 added to claim:   yes     Levonne Lapping, RN 10/08/2022, 10:00 AM

## 2022-10-08 NOTE — Progress Notes (Signed)
Pt receives out-pt HD at [place] on MWF. D/C order for today noted. Contacted clinic to advise staff of pt's d/c date and that pt should resume care on Wednesday.   Melven Sartorius Renal Navigator [phone]

## 2022-10-08 NOTE — Discharge Summary (Signed)
PATIENT DETAILS Name: Shawn [name] Age: 62 y.o. Sex: male Date of Birth: [birth year] MRN: [id number]. Admitting Physician: Bernadette Hoit, DO [id number], Iona Beard, MD  Admit Date: 10/07/2022 Discharge date: 10/08/2022  Recommendations for Outpatient Follow-up:  Follow up with PCP in 1-2 weeks Please obtain CMP/CBC in one week  Admitted From:  Home  Disposition: Home   Discharge Condition: fair  CODE STATUS:   Code Status: Full Code   Diet recommendation:  Diet Order             Diet - low sodium heart healthy           Diet Carb Modified           Diet heart healthy/carb modified Room service appropriate? Yes; Fluid consistency: Thin  Diet effective now                    Brief Summary: 62 year old with history of ESRD on HD MWF-who presented to the hospital with acute hypoxic respiratory failure due to pulmonary edema.  Patient was subsequently admitted to the hospitalist service   [facility] [facility] Course: Acute hypoxic respiratory failure due to pulm edema in the setting of ESRD/acute on chronic combined systolic/diastolic heart failure Much better after dialysis On room air Discussed with nephrologist-discharge-okay for discharge.  Hypokalemia/hypomagnesemia/hypophosphatemia Repleted Recheck at next visit with PCP/nephrology.  HTN BP stable Amlodipine/lisinopril  HLD Crestor  DM-2 CBG stable Resume 15 units of Levemir.  Obesity: Estimated body mass index is 24.61 kg/m as calculated from the following:   Height as of this encounter: 5\' 10"  (1.778 m).   Weight as of this encounter: 77.8 kg.    Discharge Diagnoses:  Principal Problem:   Acute respiratory failure with hypoxia Active Problems:   End-stage renal disease on hemodialysis   Essential hypertension   Mixed hyperlipidemia   Hypokalemia   Bilateral pleural effusion   Interstitial edema   Uncontrolled type 2 diabetes mellitus with hyperglycemia, with long-term current  use of insulin   Hyponatremia   Hypermagnesemia   Thrombocytosis   Elevated troponin   Discharge Instructions:  Activity:  As tolerated with Full fall precautions use walker/cane & assistance as needed  Discharge Instructions     Diet - low sodium heart healthy   Complete by: As directed    Diet Carb Modified   Complete by: As directed    Discharge instructions   Complete by: As directed    Follow with Primary MD  Vincente Liberty, MD in 1-2 weeks  Please get a complete blood count and chemistry panel checked by your Primary MD at your next visit, and again as instructed by your Primary MD.  Get Medicines reviewed and adjusted: Please take all your medications with you for your next visit with your Primary MD  Laboratory/radiological data: Please request your Primary MD to go over all hospital tests and procedure/radiological results at the follow up, please ask your Primary MD to get all Hospital records sent to his/her office.  In some cases, they will be blood work, cultures and biopsy results pending at the time of your discharge. Please request that your primary care M.D. follows up on these results.  Also Note the following: If you experience worsening of your admission symptoms, develop shortness of breath, life threatening emergency, suicidal or homicidal thoughts you must seek medical attention immediately by calling 911 or calling your MD immediately  if symptoms less severe.  You must read complete instructions/literature along with all the possible  adverse reactions/side effects for all the Medicines you take and that have been prescribed to you. Take any new Medicines after you have completely understood and accpet all the possible adverse reactions/side effects.   Do not drive when taking Pain medications or sleeping medications (Benzodaizepines)  Do not take more than prescribed Pain, Sleep and Anxiety Medications. It is not advisable to combine anxiety,sleep  and pain medications without talking with your primary care practitioner  Special Instructions: If you have smoked or chewed Tobacco  in the last 2 yrs please stop smoking, stop any regular Alcohol  and or any Recreational drug use.  Wear Seat belts while driving.  Please note: You were cared for by a hospitalist during your hospital stay. Once you are discharged, your primary care physician will handle any further medical issues. Please note that NO REFILLS for any discharge medications will be authorized once you are discharged, as it is imperative that you return to your primary care physician (or establish a relationship with a primary care physician if you do not have one) for your post hospital discharge needs so that they can reassess your need for medications and monitor your lab values.   Increase activity slowly   Complete by: As directed       Allergies as of 10/08/2022   No Known Allergies      Medication List     TAKE these medications    amLODipine 5 MG tablet Commonly known as: NORVASC Take by mouth.   aspirin EC 325 MG tablet Take 325 mg by mouth at bedtime.   b complex vitamins tablet Take 1 tablet by mouth at bedtime.   brimonidine 0.15 % ophthalmic solution Commonly known as: ALPHAGAN Place 1 drop into both eyes 2 (two) times daily.   calcitRIOL 0.5 MCG capsule Commonly known as: ROCALTROL Take 1 capsule (0.5 mcg total) by mouth 3 (three) times a week. What changed: additional instructions   diphenhydramine-acetaminophen 25-500 MG Tabs tablet Commonly known as: TYLENOL PM Take 1 tablet by mouth at bedtime as needed (pain).   dorzolamide-timolol 2-0.5 % ophthalmic solution Commonly known as: COSOPT Place 1 drop into both eyes 2 (two) times daily.   Droplet Pen Needles 31G X 6 MM Misc Generic drug: Insulin Pen Needle   erythromycin ophthalmic ointment Place 1 Application into both eyes at bedtime.   HECTOROL IV Doxercalciferol (Hectorol)    lanthanum 1000 MG chewable tablet Commonly known as: FOSRENOL Chew 1,000 mg by mouth 3 (three) times daily with meals.   latanoprost 0.005 % ophthalmic solution Commonly known as: XALATAN Place 1 drop into both eyes at bedtime.   Levemir FlexTouch 100 UNIT/ML FlexPen Generic drug: insulin detemir Inject 15 Units into the skin 2 (two) times daily.   lisinopril 20 MG tablet Commonly known as: ZESTRIL Take 20 mg by mouth 2 (two) times daily.   metroNIDAZOLE 500 MG tablet Commonly known as: FLAGYL Take 500 mg by mouth 2 (two) times daily.   rosuvastatin 40 MG tablet Commonly known as: CRESTOR Take 40 mg by mouth daily.   Velphoro 500 MG chewable tablet Generic drug: sucroferric oxyhydroxide Chew 1,000 mg by mouth 3 (three) times daily.        Follow-up Information     Vincente Liberty, MD. Schedule an appointment as soon as possible for a visit in 1 week(s).   Specialty: Pulmonary Disease Contact information: [place] [address] [phone]  No Known Allergies   Other Procedures/Studies: DG Chest Port 1 View  Result Date: 10/07/2022 CLINICAL DATA:  Shortness of breath. EXAM: PORTABLE CHEST 1 VIEW COMPARISON:  06/15/2019 FINDINGS: Right chest wall dialysis catheter noted with tips in the right atrium. Stable cardiomediastinal contours. Small bilateral pleural effusions. Mild interstitial edema suspected. Well-circumscribed rounded density within the right midlung is favored to represent loculated fluid along the minor fissure. Decreased aeration to both lung bases may reflect atelectasis or airspace disease. IMPRESSION: 1. Small bilateral pleural effusions. 2. Suspect mild interstitial edema. 3. Decreased aeration to both lung bases may reflect atelectasis or airspace disease. Electronically Signed   By: Kerby Moors M.D.   On: 10/07/2022 13:49     TODAY-DAY OF DISCHARGE:  Subjective:   Shawn [name] today has  no headache,no chest abdominal pain,no new weakness tingling or numbness, feels much better wants to go home today.  Objective:   Blood pressure (!) 158/74, pulse 89, temperature 98.5 F (36.9 C), temperature source Oral, resp. rate 20, height 5\' 10"  (1.778 m), weight 77.8 kg, SpO2 (!) 89 %.  Intake/Output Summary (Last 24 hours) at 10/08/2022 0955 Last data filed at 10/08/2022 [id number] Gross per 24 hour  Intake --  Output 4500 ml  Net -4500 ml   Filed Weights   10/07/22 1245 10/08/22 0339  Weight: 81.6 kg 77.8 kg    Exam: Awake Alert, Oriented *3, No new F.N deficits, Normal affect [place].AT,PERRAL Supple Neck,No JVD, No cervical lymphadenopathy appriciated.  Symmetrical Chest wall movement, Good air movement bilaterally, CTAB RRR,No Gallops,Rubs or new Murmurs, No Parasternal Heave +ve B.Sounds, Abd Soft, Non tender, No organomegaly appriciated, No rebound -guarding or rigidity. No Cyanosis, Clubbing or edema, No new Rash or bruise   PERTINENT RADIOLOGIC STUDIES: DG Chest Port 1 View  Result Date: 10/07/2022 CLINICAL DATA:  Shortness of breath. EXAM: PORTABLE CHEST 1 VIEW COMPARISON:  06/15/2019 FINDINGS: Right chest wall dialysis catheter noted with tips in the right atrium. Stable cardiomediastinal contours. Small bilateral pleural effusions. Mild interstitial edema suspected. Well-circumscribed rounded density within the right midlung is favored to represent loculated fluid along the minor fissure. Decreased aeration to both lung bases may reflect atelectasis or airspace disease. IMPRESSION: 1. Small bilateral pleural effusions. 2. Suspect mild interstitial edema. 3. Decreased aeration to both lung bases may reflect atelectasis or airspace disease. Electronically Signed   By: Kerby Moors M.D.   On: 10/07/2022 13:49     PERTINENT LAB RESULTS: CBC: Recent Labs    10/07/22 1404 10/08/22 0803  WBC 7.9 7.8  HGB 9.2* 8.4*  HCT 29.9* 26.5*  PLT 440* 416*   CMET CMP     Component  Value Date/Time   NA 131 (L) 10/08/2022 0803   K 3.8 10/08/2022 0803   CL 96 (L) 10/08/2022 0803   CO2 26 10/08/2022 0803   GLUCOSE 150 (H) 10/08/2022 0803   BUN 6 (L) 10/08/2022 0803   CREATININE 3.58 (H) 10/08/2022 0803   CALCIUM 8.0 (L) 10/08/2022 0803   CALCIUM 7.7 (L) 01/28/2010 1225   PROT 7.4 10/08/2022 0803   ALBUMIN 2.8 (L) 10/08/2022 0803   AST 12 (L) 10/08/2022 0803   ALT <5 10/08/2022 0803   ALKPHOS 64 10/08/2022 0803   BILITOT 0.8 10/08/2022 0803   GFRNONAA 19 (L) 10/08/2022 0803   GFRAA 18 (L) 01/20/2020 1150    GFR Estimated Creatinine Clearance: 22.4 mL/min (A) (by C-G formula based on SCr of 3.58 mg/dL (H)). No results for input(s): "LIPASE", "  AMYLASE" in the last 72 hours. No results for input(s): "CKTOTAL", "CKMB", "CKMBINDEX", "TROPONINI" in the last 72 hours. Invalid input(s): "POCBNP" No results for input(s): "DDIMER" in the last 72 hours. No results for input(s): "HGBA1C" in the last 72 hours. No results for input(s): "CHOL", "HDL", "LDLCALC", "TRIG", "CHOLHDL", "LDLDIRECT" in the last 72 hours. No results for input(s): "TSH", "T4TOTAL", "T3FREE", "THYROIDAB" in the last 72 hours.  Invalid input(s): "FREET3" No results for input(s): "VITAMINB12", "FOLATE", "FERRITIN", "TIBC", "IRON", "RETICCTPCT" in the last 72 hours. Coags: No results for input(s): "INR" in the last 72 hours.  Invalid input(s): "PT" Microbiology: Recent Results (from the past 240 hour(s))  Resp panel by RT-PCR (RSV, Flu A&B, Covid) Anterior Nasal Swab     Status: None   Collection Time: 10/07/22  4:20 PM   Specimen: Anterior Nasal Swab  Result Value Ref Range Status   SARS Coronavirus 2 by RT PCR NEGATIVE NEGATIVE Final   Influenza A by PCR NEGATIVE NEGATIVE Final   Influenza B by PCR NEGATIVE NEGATIVE Final    Comment: (NOTE) The Xpert Xpress SARS-CoV-2/FLU/RSV plus assay is intended as an aid in the diagnosis of influenza from Nasopharyngeal swab specimens and should not be  used as a sole basis for treatment. Nasal washings and aspirates are unacceptable for Xpert Xpress SARS-CoV-2/FLU/RSV testing.  Fact Sheet for Patients: EntrepreneurPulse.com.au  Fact Sheet for Healthcare Providers: IncredibleEmployment.be  This test is not yet approved or cleared by the [place] FDA and has been authorized for detection and/or diagnosis of SARS-CoV-2 by FDA under an Emergency Use Authorization (EUA). This EUA will remain in effect (meaning this test can be used) for the duration of the COVID-19 declaration under Section 564(b)(1) of the Act, 21 U.S.C. section 360bbb-3(b)(1), unless the authorization is terminated or revoked.     Resp Syncytial Virus by PCR NEGATIVE NEGATIVE Final    Comment: (NOTE) Fact Sheet for Patients: EntrepreneurPulse.com.au  Fact Sheet for Healthcare Providers: IncredibleEmployment.be  This test is not yet approved or cleared by the [place] FDA and has been authorized for detection and/or diagnosis of SARS-CoV-2 by FDA under an Emergency Use Authorization (EUA). This EUA will remain in effect (meaning this test can be used) for the duration of the COVID-19 declaration under Section 564(b)(1) of the Act, 21 U.S.C. section 360bbb-3(b)(1), unless the authorization is terminated or revoked.  Performed at [facility], [place] [address] [place], [place] [address]     FURTHER DISCHARGE INSTRUCTIONS:  Get Medicines reviewed and adjusted: Please take all your medications with you for your next visit with your Primary MD  Laboratory/radiological data: Please request your Primary MD to go over all hospital tests and procedure/radiological results at the follow up, please ask your Primary MD to get all Hospital records sent to his/her office.  In some cases, they will be blood work, cultures and biopsy results pending at the time of your discharge. Please  request that your primary care M.D. goes through all the records of your hospital data and follows up on these results.  Also Note the following: If you experience worsening of your admission symptoms, develop shortness of breath, life threatening emergency, suicidal or homicidal thoughts you must seek medical attention immediately by calling 911 or calling your MD immediately  if symptoms less severe.  You must read complete instructions/literature along with all the possible adverse reactions/side effects for all the Medicines you take and that have been prescribed to you. Take any new Medicines after  you have completely understood and accpet all the possible adverse reactions/side effects.   Do not drive when taking Pain medications or sleeping medications (Benzodaizepines)  Do not take more than prescribed Pain, Sleep and Anxiety Medications. It is not advisable to combine anxiety,sleep and pain medications without talking with your primary care practitioner  Special Instructions: If you have smoked or chewed Tobacco  in the last 2 yrs please stop smoking, stop any regular Alcohol  and or any Recreational drug use.  Wear Seat belts while driving.  Please note: You were cared for by a hospitalist during your hospital stay. Once you are discharged, your primary care physician will handle any further medical issues. Please note that NO REFILLS for any discharge medications will be authorized once you are discharged, as it is imperative that you return to your primary care physician (or establish a relationship with a primary care physician if you do not have one) for your post hospital discharge needs so that they can reassess your need for medications and monitor your lab values.  Total Time spent coordinating discharge including counseling, education and face to face time equals greater than 30 minutes.  SignedOren Binet 10/08/2022 9:55 AM

## 2022-10-09 LAB — HEPATITIS B SURFACE ANTIBODY, QUANTITATIVE: Hep B S AB Quant (Post): 82.2 m[IU]/mL (ref >9.9)

## 2022-10-10 DIAGNOSIS — Z992 Dependence on renal dialysis: Secondary | ICD-10-CM | POA: Diagnosis not present

## 2022-10-10 DIAGNOSIS — J9601 Acute respiratory failure with hypoxia: Secondary | ICD-10-CM | POA: Diagnosis not present

## 2022-10-10 DIAGNOSIS — E119 Type 2 diabetes mellitus without complications: Secondary | ICD-10-CM | POA: Diagnosis not present

## 2022-10-10 DIAGNOSIS — D631 Anemia in chronic kidney disease: Secondary | ICD-10-CM | POA: Diagnosis not present

## 2022-10-10 DIAGNOSIS — Z23 Encounter for immunization: Secondary | ICD-10-CM | POA: Diagnosis not present

## 2022-10-10 DIAGNOSIS — E876 Hypokalemia: Secondary | ICD-10-CM | POA: Diagnosis not present

## 2022-10-10 DIAGNOSIS — D509 Iron deficiency anemia, unspecified: Secondary | ICD-10-CM | POA: Diagnosis not present

## 2022-10-10 DIAGNOSIS — N2581 Secondary hyperparathyroidism of renal origin: Secondary | ICD-10-CM | POA: Diagnosis not present

## 2022-10-10 DIAGNOSIS — N186 End stage renal disease: Secondary | ICD-10-CM | POA: Diagnosis not present

## 2022-10-12 DIAGNOSIS — Z992 Dependence on renal dialysis: Secondary | ICD-10-CM | POA: Diagnosis not present

## 2022-10-12 DIAGNOSIS — E876 Hypokalemia: Secondary | ICD-10-CM | POA: Diagnosis not present

## 2022-10-12 DIAGNOSIS — D631 Anemia in chronic kidney disease: Secondary | ICD-10-CM | POA: Diagnosis not present

## 2022-10-12 DIAGNOSIS — N186 End stage renal disease: Secondary | ICD-10-CM | POA: Diagnosis not present

## 2022-10-12 DIAGNOSIS — D509 Iron deficiency anemia, unspecified: Secondary | ICD-10-CM | POA: Diagnosis not present

## 2022-10-12 DIAGNOSIS — E119 Type 2 diabetes mellitus without complications: Secondary | ICD-10-CM | POA: Diagnosis not present

## 2022-10-15 DIAGNOSIS — Z992 Dependence on renal dialysis: Secondary | ICD-10-CM | POA: Diagnosis not present

## 2022-10-15 DIAGNOSIS — N186 End stage renal disease: Secondary | ICD-10-CM | POA: Diagnosis not present

## 2022-10-15 DIAGNOSIS — E876 Hypokalemia: Secondary | ICD-10-CM | POA: Diagnosis not present

## 2022-10-15 DIAGNOSIS — D631 Anemia in chronic kidney disease: Secondary | ICD-10-CM | POA: Diagnosis not present

## 2022-10-15 DIAGNOSIS — E119 Type 2 diabetes mellitus without complications: Secondary | ICD-10-CM | POA: Diagnosis not present

## 2022-10-15 DIAGNOSIS — D509 Iron deficiency anemia, unspecified: Secondary | ICD-10-CM | POA: Diagnosis not present

## 2022-10-17 DIAGNOSIS — D509 Iron deficiency anemia, unspecified: Secondary | ICD-10-CM | POA: Diagnosis not present

## 2022-10-17 DIAGNOSIS — D631 Anemia in chronic kidney disease: Secondary | ICD-10-CM | POA: Diagnosis not present

## 2022-10-17 DIAGNOSIS — Z992 Dependence on renal dialysis: Secondary | ICD-10-CM | POA: Diagnosis not present

## 2022-10-17 DIAGNOSIS — N186 End stage renal disease: Secondary | ICD-10-CM | POA: Diagnosis not present

## 2022-10-17 DIAGNOSIS — E876 Hypokalemia: Secondary | ICD-10-CM | POA: Diagnosis not present

## 2022-10-17 DIAGNOSIS — E119 Type 2 diabetes mellitus without complications: Secondary | ICD-10-CM | POA: Diagnosis not present

## 2022-10-19 DIAGNOSIS — N186 End stage renal disease: Secondary | ICD-10-CM | POA: Diagnosis not present

## 2022-10-19 DIAGNOSIS — D509 Iron deficiency anemia, unspecified: Secondary | ICD-10-CM | POA: Diagnosis not present

## 2022-10-19 DIAGNOSIS — E119 Type 2 diabetes mellitus without complications: Secondary | ICD-10-CM | POA: Diagnosis not present

## 2022-10-19 DIAGNOSIS — E876 Hypokalemia: Secondary | ICD-10-CM | POA: Diagnosis not present

## 2022-10-19 DIAGNOSIS — D631 Anemia in chronic kidney disease: Secondary | ICD-10-CM | POA: Diagnosis not present

## 2022-10-19 DIAGNOSIS — Z992 Dependence on renal dialysis: Secondary | ICD-10-CM | POA: Diagnosis not present

## 2022-10-22 DIAGNOSIS — N186 End stage renal disease: Secondary | ICD-10-CM | POA: Diagnosis not present

## 2022-10-22 DIAGNOSIS — Z992 Dependence on renal dialysis: Secondary | ICD-10-CM | POA: Diagnosis not present

## 2022-10-22 DIAGNOSIS — E876 Hypokalemia: Secondary | ICD-10-CM | POA: Diagnosis not present

## 2022-10-22 DIAGNOSIS — D631 Anemia in chronic kidney disease: Secondary | ICD-10-CM | POA: Diagnosis not present

## 2022-10-22 DIAGNOSIS — E119 Type 2 diabetes mellitus without complications: Secondary | ICD-10-CM | POA: Diagnosis not present

## 2022-10-22 DIAGNOSIS — D509 Iron deficiency anemia, unspecified: Secondary | ICD-10-CM | POA: Diagnosis not present

## 2022-10-24 DIAGNOSIS — E119 Type 2 diabetes mellitus without complications: Secondary | ICD-10-CM | POA: Diagnosis not present

## 2022-10-24 DIAGNOSIS — D509 Iron deficiency anemia, unspecified: Secondary | ICD-10-CM | POA: Diagnosis not present

## 2022-10-24 DIAGNOSIS — Z992 Dependence on renal dialysis: Secondary | ICD-10-CM | POA: Diagnosis not present

## 2022-10-24 DIAGNOSIS — N186 End stage renal disease: Secondary | ICD-10-CM | POA: Diagnosis not present

## 2022-10-24 DIAGNOSIS — D631 Anemia in chronic kidney disease: Secondary | ICD-10-CM | POA: Diagnosis not present

## 2022-10-24 DIAGNOSIS — E876 Hypokalemia: Secondary | ICD-10-CM | POA: Diagnosis not present

## 2022-10-26 DIAGNOSIS — D631 Anemia in chronic kidney disease: Secondary | ICD-10-CM | POA: Diagnosis not present

## 2022-10-26 DIAGNOSIS — E119 Type 2 diabetes mellitus without complications: Secondary | ICD-10-CM | POA: Diagnosis not present

## 2022-10-26 DIAGNOSIS — N186 End stage renal disease: Secondary | ICD-10-CM | POA: Diagnosis not present

## 2022-10-26 DIAGNOSIS — E876 Hypokalemia: Secondary | ICD-10-CM | POA: Diagnosis not present

## 2022-10-26 DIAGNOSIS — Z992 Dependence on renal dialysis: Secondary | ICD-10-CM | POA: Diagnosis not present

## 2022-10-26 DIAGNOSIS — D509 Iron deficiency anemia, unspecified: Secondary | ICD-10-CM | POA: Diagnosis not present

## 2022-10-29 DIAGNOSIS — D509 Iron deficiency anemia, unspecified: Secondary | ICD-10-CM | POA: Diagnosis not present

## 2022-10-29 DIAGNOSIS — E119 Type 2 diabetes mellitus without complications: Secondary | ICD-10-CM | POA: Diagnosis not present

## 2022-10-29 DIAGNOSIS — E876 Hypokalemia: Secondary | ICD-10-CM | POA: Diagnosis not present

## 2022-10-29 DIAGNOSIS — N186 End stage renal disease: Secondary | ICD-10-CM | POA: Diagnosis not present

## 2022-10-29 DIAGNOSIS — Z992 Dependence on renal dialysis: Secondary | ICD-10-CM | POA: Diagnosis not present

## 2022-10-29 DIAGNOSIS — D631 Anemia in chronic kidney disease: Secondary | ICD-10-CM | POA: Diagnosis not present

## 2022-10-31 DIAGNOSIS — D509 Iron deficiency anemia, unspecified: Secondary | ICD-10-CM | POA: Diagnosis not present

## 2022-10-31 DIAGNOSIS — N186 End stage renal disease: Secondary | ICD-10-CM | POA: Diagnosis not present

## 2022-10-31 DIAGNOSIS — D631 Anemia in chronic kidney disease: Secondary | ICD-10-CM | POA: Diagnosis not present

## 2022-10-31 DIAGNOSIS — E876 Hypokalemia: Secondary | ICD-10-CM | POA: Diagnosis not present

## 2022-10-31 DIAGNOSIS — E119 Type 2 diabetes mellitus without complications: Secondary | ICD-10-CM | POA: Diagnosis not present

## 2022-10-31 DIAGNOSIS — Z992 Dependence on renal dialysis: Secondary | ICD-10-CM | POA: Diagnosis not present

## 2022-11-01 DIAGNOSIS — E161 Other hypoglycemia: Secondary | ICD-10-CM | POA: Diagnosis not present

## 2022-11-01 DIAGNOSIS — R001 Bradycardia, unspecified: Secondary | ICD-10-CM | POA: Diagnosis not present

## 2022-11-02 DIAGNOSIS — Z992 Dependence on renal dialysis: Secondary | ICD-10-CM | POA: Diagnosis not present

## 2022-11-02 DIAGNOSIS — N186 End stage renal disease: Secondary | ICD-10-CM | POA: Diagnosis not present

## 2022-11-02 DIAGNOSIS — E119 Type 2 diabetes mellitus without complications: Secondary | ICD-10-CM | POA: Diagnosis not present

## 2022-11-02 DIAGNOSIS — D509 Iron deficiency anemia, unspecified: Secondary | ICD-10-CM | POA: Diagnosis not present

## 2022-11-02 DIAGNOSIS — D631 Anemia in chronic kidney disease: Secondary | ICD-10-CM | POA: Diagnosis not present

## 2022-11-02 DIAGNOSIS — E876 Hypokalemia: Secondary | ICD-10-CM | POA: Diagnosis not present

## 2022-11-05 DIAGNOSIS — N186 End stage renal disease: Secondary | ICD-10-CM | POA: Diagnosis not present

## 2022-11-05 DIAGNOSIS — D509 Iron deficiency anemia, unspecified: Secondary | ICD-10-CM | POA: Diagnosis not present

## 2022-11-05 DIAGNOSIS — E119 Type 2 diabetes mellitus without complications: Secondary | ICD-10-CM | POA: Diagnosis not present

## 2022-11-05 DIAGNOSIS — D631 Anemia in chronic kidney disease: Secondary | ICD-10-CM | POA: Diagnosis not present

## 2022-11-05 DIAGNOSIS — E876 Hypokalemia: Secondary | ICD-10-CM | POA: Diagnosis not present

## 2022-11-05 DIAGNOSIS — Z992 Dependence on renal dialysis: Secondary | ICD-10-CM | POA: Diagnosis not present

## 2022-11-07 DIAGNOSIS — N186 End stage renal disease: Secondary | ICD-10-CM | POA: Diagnosis not present

## 2022-11-07 DIAGNOSIS — I120 Hypertensive chronic kidney disease with stage 5 chronic kidney disease or end stage renal disease: Secondary | ICD-10-CM | POA: Diagnosis not present

## 2022-11-07 DIAGNOSIS — Z992 Dependence on renal dialysis: Secondary | ICD-10-CM | POA: Diagnosis not present

## 2022-11-07 DIAGNOSIS — D509 Iron deficiency anemia, unspecified: Secondary | ICD-10-CM | POA: Diagnosis not present

## 2022-11-07 DIAGNOSIS — E876 Hypokalemia: Secondary | ICD-10-CM | POA: Diagnosis not present

## 2022-11-07 DIAGNOSIS — E119 Type 2 diabetes mellitus without complications: Secondary | ICD-10-CM | POA: Diagnosis not present

## 2022-11-07 DIAGNOSIS — D631 Anemia in chronic kidney disease: Secondary | ICD-10-CM | POA: Diagnosis not present

## 2022-11-07 DIAGNOSIS — N2581 Secondary hyperparathyroidism of renal origin: Secondary | ICD-10-CM | POA: Diagnosis not present

## 2022-11-09 DIAGNOSIS — E876 Hypokalemia: Secondary | ICD-10-CM | POA: Diagnosis not present

## 2022-11-09 DIAGNOSIS — D509 Iron deficiency anemia, unspecified: Secondary | ICD-10-CM | POA: Diagnosis not present

## 2022-11-09 DIAGNOSIS — E119 Type 2 diabetes mellitus without complications: Secondary | ICD-10-CM | POA: Diagnosis not present

## 2022-11-09 DIAGNOSIS — D631 Anemia in chronic kidney disease: Secondary | ICD-10-CM | POA: Diagnosis not present

## 2022-11-09 DIAGNOSIS — Z992 Dependence on renal dialysis: Secondary | ICD-10-CM | POA: Diagnosis not present

## 2022-11-09 DIAGNOSIS — N186 End stage renal disease: Secondary | ICD-10-CM | POA: Diagnosis not present

## 2022-11-12 DIAGNOSIS — D631 Anemia in chronic kidney disease: Secondary | ICD-10-CM | POA: Diagnosis not present

## 2022-11-12 DIAGNOSIS — E119 Type 2 diabetes mellitus without complications: Secondary | ICD-10-CM | POA: Diagnosis not present

## 2022-11-12 DIAGNOSIS — Z992 Dependence on renal dialysis: Secondary | ICD-10-CM | POA: Diagnosis not present

## 2022-11-12 DIAGNOSIS — N186 End stage renal disease: Secondary | ICD-10-CM | POA: Diagnosis not present

## 2022-11-12 DIAGNOSIS — D509 Iron deficiency anemia, unspecified: Secondary | ICD-10-CM | POA: Diagnosis not present

## 2022-11-12 DIAGNOSIS — E876 Hypokalemia: Secondary | ICD-10-CM | POA: Diagnosis not present

## 2022-11-14 DIAGNOSIS — Z992 Dependence on renal dialysis: Secondary | ICD-10-CM | POA: Diagnosis not present

## 2022-11-14 DIAGNOSIS — D509 Iron deficiency anemia, unspecified: Secondary | ICD-10-CM | POA: Diagnosis not present

## 2022-11-14 DIAGNOSIS — E119 Type 2 diabetes mellitus without complications: Secondary | ICD-10-CM | POA: Diagnosis not present

## 2022-11-14 DIAGNOSIS — N186 End stage renal disease: Secondary | ICD-10-CM | POA: Diagnosis not present

## 2022-11-14 DIAGNOSIS — E876 Hypokalemia: Secondary | ICD-10-CM | POA: Diagnosis not present

## 2022-11-14 DIAGNOSIS — D631 Anemia in chronic kidney disease: Secondary | ICD-10-CM | POA: Diagnosis not present

## 2022-11-16 DIAGNOSIS — D509 Iron deficiency anemia, unspecified: Secondary | ICD-10-CM | POA: Diagnosis not present

## 2022-11-16 DIAGNOSIS — Z992 Dependence on renal dialysis: Secondary | ICD-10-CM | POA: Diagnosis not present

## 2022-11-16 DIAGNOSIS — E876 Hypokalemia: Secondary | ICD-10-CM | POA: Diagnosis not present

## 2022-11-16 DIAGNOSIS — N186 End stage renal disease: Secondary | ICD-10-CM | POA: Diagnosis not present

## 2022-11-16 DIAGNOSIS — E119 Type 2 diabetes mellitus without complications: Secondary | ICD-10-CM | POA: Diagnosis not present

## 2022-11-16 DIAGNOSIS — D631 Anemia in chronic kidney disease: Secondary | ICD-10-CM | POA: Diagnosis not present

## 2022-11-19 DIAGNOSIS — D631 Anemia in chronic kidney disease: Secondary | ICD-10-CM | POA: Diagnosis not present

## 2022-11-19 DIAGNOSIS — Z992 Dependence on renal dialysis: Secondary | ICD-10-CM | POA: Diagnosis not present

## 2022-11-19 DIAGNOSIS — E876 Hypokalemia: Secondary | ICD-10-CM | POA: Diagnosis not present

## 2022-11-19 DIAGNOSIS — N186 End stage renal disease: Secondary | ICD-10-CM | POA: Diagnosis not present

## 2022-11-19 DIAGNOSIS — E119 Type 2 diabetes mellitus without complications: Secondary | ICD-10-CM | POA: Diagnosis not present

## 2022-11-19 DIAGNOSIS — D509 Iron deficiency anemia, unspecified: Secondary | ICD-10-CM | POA: Diagnosis not present

## 2022-11-21 DIAGNOSIS — E1129 Type 2 diabetes mellitus with other diabetic kidney complication: Secondary | ICD-10-CM | POA: Diagnosis not present

## 2022-11-21 DIAGNOSIS — E876 Hypokalemia: Secondary | ICD-10-CM | POA: Diagnosis not present

## 2022-11-21 DIAGNOSIS — N186 End stage renal disease: Secondary | ICD-10-CM | POA: Diagnosis not present

## 2022-11-21 DIAGNOSIS — Z992 Dependence on renal dialysis: Secondary | ICD-10-CM | POA: Diagnosis not present

## 2022-11-21 DIAGNOSIS — E119 Type 2 diabetes mellitus without complications: Secondary | ICD-10-CM | POA: Diagnosis not present

## 2022-11-21 DIAGNOSIS — D631 Anemia in chronic kidney disease: Secondary | ICD-10-CM | POA: Diagnosis not present

## 2022-11-21 DIAGNOSIS — D509 Iron deficiency anemia, unspecified: Secondary | ICD-10-CM | POA: Diagnosis not present

## 2022-11-23 DIAGNOSIS — E876 Hypokalemia: Secondary | ICD-10-CM | POA: Diagnosis not present

## 2022-11-23 DIAGNOSIS — E119 Type 2 diabetes mellitus without complications: Secondary | ICD-10-CM | POA: Diagnosis not present

## 2022-11-23 DIAGNOSIS — D631 Anemia in chronic kidney disease: Secondary | ICD-10-CM | POA: Diagnosis not present

## 2022-11-23 DIAGNOSIS — N186 End stage renal disease: Secondary | ICD-10-CM | POA: Diagnosis not present

## 2022-11-23 DIAGNOSIS — Z992 Dependence on renal dialysis: Secondary | ICD-10-CM | POA: Diagnosis not present

## 2022-11-23 DIAGNOSIS — D509 Iron deficiency anemia, unspecified: Secondary | ICD-10-CM | POA: Diagnosis not present

## 2022-11-26 DIAGNOSIS — Z992 Dependence on renal dialysis: Secondary | ICD-10-CM | POA: Diagnosis not present

## 2022-11-26 DIAGNOSIS — E876 Hypokalemia: Secondary | ICD-10-CM | POA: Diagnosis not present

## 2022-11-26 DIAGNOSIS — D631 Anemia in chronic kidney disease: Secondary | ICD-10-CM | POA: Diagnosis not present

## 2022-11-26 DIAGNOSIS — E119 Type 2 diabetes mellitus without complications: Secondary | ICD-10-CM | POA: Diagnosis not present

## 2022-11-26 DIAGNOSIS — N186 End stage renal disease: Secondary | ICD-10-CM | POA: Diagnosis not present

## 2022-11-26 DIAGNOSIS — D509 Iron deficiency anemia, unspecified: Secondary | ICD-10-CM | POA: Diagnosis not present

## 2022-11-27 ENCOUNTER — Ambulatory Visit (INDEPENDENT_AMBULATORY_CARE_PROVIDER_SITE_OTHER): Payer: Medicare Other | Admitting: Podiatry

## 2022-11-27 ENCOUNTER — Encounter: Payer: Self-pay | Admitting: Podiatry

## 2022-11-27 DIAGNOSIS — M79675 Pain in left toe(s): Secondary | ICD-10-CM | POA: Diagnosis not present

## 2022-11-27 DIAGNOSIS — Z992 Dependence on renal dialysis: Secondary | ICD-10-CM

## 2022-11-27 DIAGNOSIS — E1151 Type 2 diabetes mellitus with diabetic peripheral angiopathy without gangrene: Secondary | ICD-10-CM | POA: Diagnosis not present

## 2022-11-27 DIAGNOSIS — B351 Tinea unguium: Secondary | ICD-10-CM

## 2022-11-27 DIAGNOSIS — N186 End stage renal disease: Secondary | ICD-10-CM | POA: Diagnosis not present

## 2022-11-27 DIAGNOSIS — M79674 Pain in right toe(s): Secondary | ICD-10-CM

## 2022-11-27 NOTE — Progress Notes (Signed)
  Subjective:  Patient ID: Shawn [name], male    DOB: 1960-08-20,  MRN: [id number]  Shawn [name] presents to clinic today for at risk foot care. Pt has h/o NIDDM with PAD and painful thick toenails that are difficult to trim. Pain interferes with ambulation. Aggravating factors include wearing enclosed shoe gear. Pain is relieved with periodic professional debridement.  Chief Complaint  Patient presents with   Nail Problem    DFC BS-did not check today A1C-6.5 PCP-[facility] PCP VST- 3 months ago    New problem(s): None.   PCP is Corine Shelter, MD.  No Known Allergies  Review of Systems: Negative except as noted in the HPI.  Objective: No changes noted in today's physical examination. There were no vitals filed for this visit. Shawn [name] is a pleasant 62 y.o. male thin build in NAD. AAO x 3. Vascular Examination: CFT <4 seconds b/l. DP pulses diminished b/l. PT pulses diminished b/l. Digital hair absent. Skin temperature gradient warm to cool b/l. No ischemia or gangrene. No cyanosis or clubbing noted b/l. No edema noted b/l LE. No varicosities noted.   Neurological Examination: Protective sensation diminished with 10g monofilament b/l. Vibratory sensation intact b/l.  Dermatological Examination: Pedal skin thin, shiny and atrophic b/l. No open wounds. No interdigital macerations.   Toenails 1-5 b/l thick, discolored, elongated with subungual debris and pain on dorsal palpation.   No hyperkeratotic nor porokeratotic lesions present on today's visit.  Musculoskeletal Examination: Muscle strength 5/5 to b/l LE. Hammertoe(s) noted to the L 2nd toe and R 2nd toe.  Radiographs: None  Assessment/Plan: 1. Pain due to onychomycosis of toenails of both feet   2. ESRD on dialysis (HCC)   3. Type II diabetes mellitus with peripheral circulatory disorder (HCC)    -Consent given for treatment as described below: -Examined patient. -Continue foot and  shoe inspections daily. Monitor blood glucose per PCP/Endocrinologist's recommendations. -Continue supportive shoe gear daily. -Toenails 1-5 b/l were debrided in length and girth with sterile nail nippers and dremel without iatrogenic bleeding.  -Patient/POA to call should there be question/concern in the interim.   Return in about 9 weeks (around 01/29/2023).  Freddie Breech, DPM

## 2022-11-28 DIAGNOSIS — E119 Type 2 diabetes mellitus without complications: Secondary | ICD-10-CM | POA: Diagnosis not present

## 2022-11-28 DIAGNOSIS — D631 Anemia in chronic kidney disease: Secondary | ICD-10-CM | POA: Diagnosis not present

## 2022-11-28 DIAGNOSIS — D509 Iron deficiency anemia, unspecified: Secondary | ICD-10-CM | POA: Diagnosis not present

## 2022-11-28 DIAGNOSIS — N186 End stage renal disease: Secondary | ICD-10-CM | POA: Diagnosis not present

## 2022-11-28 DIAGNOSIS — E876 Hypokalemia: Secondary | ICD-10-CM | POA: Diagnosis not present

## 2022-11-28 DIAGNOSIS — Z992 Dependence on renal dialysis: Secondary | ICD-10-CM | POA: Diagnosis not present

## 2022-11-30 DIAGNOSIS — D631 Anemia in chronic kidney disease: Secondary | ICD-10-CM | POA: Diagnosis not present

## 2022-11-30 DIAGNOSIS — N186 End stage renal disease: Secondary | ICD-10-CM | POA: Diagnosis not present

## 2022-11-30 DIAGNOSIS — Z992 Dependence on renal dialysis: Secondary | ICD-10-CM | POA: Diagnosis not present

## 2022-11-30 DIAGNOSIS — E876 Hypokalemia: Secondary | ICD-10-CM | POA: Diagnosis not present

## 2022-11-30 DIAGNOSIS — E119 Type 2 diabetes mellitus without complications: Secondary | ICD-10-CM | POA: Diagnosis not present

## 2022-11-30 DIAGNOSIS — D509 Iron deficiency anemia, unspecified: Secondary | ICD-10-CM | POA: Diagnosis not present

## 2022-12-03 DIAGNOSIS — Z992 Dependence on renal dialysis: Secondary | ICD-10-CM | POA: Diagnosis not present

## 2022-12-03 DIAGNOSIS — N186 End stage renal disease: Secondary | ICD-10-CM | POA: Diagnosis not present

## 2022-12-03 DIAGNOSIS — D509 Iron deficiency anemia, unspecified: Secondary | ICD-10-CM | POA: Diagnosis not present

## 2022-12-03 DIAGNOSIS — E119 Type 2 diabetes mellitus without complications: Secondary | ICD-10-CM | POA: Diagnosis not present

## 2022-12-03 DIAGNOSIS — E876 Hypokalemia: Secondary | ICD-10-CM | POA: Diagnosis not present

## 2022-12-03 DIAGNOSIS — D631 Anemia in chronic kidney disease: Secondary | ICD-10-CM | POA: Diagnosis not present

## 2022-12-05 DIAGNOSIS — E876 Hypokalemia: Secondary | ICD-10-CM | POA: Diagnosis not present

## 2022-12-05 DIAGNOSIS — N186 End stage renal disease: Secondary | ICD-10-CM | POA: Diagnosis not present

## 2022-12-05 DIAGNOSIS — D509 Iron deficiency anemia, unspecified: Secondary | ICD-10-CM | POA: Diagnosis not present

## 2022-12-05 DIAGNOSIS — Z992 Dependence on renal dialysis: Secondary | ICD-10-CM | POA: Diagnosis not present

## 2022-12-05 DIAGNOSIS — E119 Type 2 diabetes mellitus without complications: Secondary | ICD-10-CM | POA: Diagnosis not present

## 2022-12-05 DIAGNOSIS — D631 Anemia in chronic kidney disease: Secondary | ICD-10-CM | POA: Diagnosis not present

## 2022-12-07 DIAGNOSIS — Z992 Dependence on renal dialysis: Secondary | ICD-10-CM | POA: Diagnosis not present

## 2022-12-07 DIAGNOSIS — D631 Anemia in chronic kidney disease: Secondary | ICD-10-CM | POA: Diagnosis not present

## 2022-12-07 DIAGNOSIS — D509 Iron deficiency anemia, unspecified: Secondary | ICD-10-CM | POA: Diagnosis not present

## 2022-12-07 DIAGNOSIS — E876 Hypokalemia: Secondary | ICD-10-CM | POA: Diagnosis not present

## 2022-12-07 DIAGNOSIS — N186 End stage renal disease: Secondary | ICD-10-CM | POA: Diagnosis not present

## 2022-12-07 DIAGNOSIS — E119 Type 2 diabetes mellitus without complications: Secondary | ICD-10-CM | POA: Diagnosis not present

## 2022-12-08 DIAGNOSIS — I120 Hypertensive chronic kidney disease with stage 5 chronic kidney disease or end stage renal disease: Secondary | ICD-10-CM | POA: Diagnosis not present

## 2022-12-08 DIAGNOSIS — N186 End stage renal disease: Secondary | ICD-10-CM | POA: Diagnosis not present

## 2022-12-08 DIAGNOSIS — Z992 Dependence on renal dialysis: Secondary | ICD-10-CM | POA: Diagnosis not present

## 2022-12-10 DIAGNOSIS — E119 Type 2 diabetes mellitus without complications: Secondary | ICD-10-CM | POA: Diagnosis not present

## 2022-12-10 DIAGNOSIS — N2581 Secondary hyperparathyroidism of renal origin: Secondary | ICD-10-CM | POA: Diagnosis not present

## 2022-12-10 DIAGNOSIS — E876 Hypokalemia: Secondary | ICD-10-CM | POA: Diagnosis not present

## 2022-12-10 DIAGNOSIS — D509 Iron deficiency anemia, unspecified: Secondary | ICD-10-CM | POA: Diagnosis not present

## 2022-12-10 DIAGNOSIS — N186 End stage renal disease: Secondary | ICD-10-CM | POA: Diagnosis not present

## 2022-12-10 DIAGNOSIS — D631 Anemia in chronic kidney disease: Secondary | ICD-10-CM | POA: Diagnosis not present

## 2022-12-10 DIAGNOSIS — Z992 Dependence on renal dialysis: Secondary | ICD-10-CM | POA: Diagnosis not present

## 2022-12-12 DIAGNOSIS — E876 Hypokalemia: Secondary | ICD-10-CM | POA: Diagnosis not present

## 2022-12-12 DIAGNOSIS — D631 Anemia in chronic kidney disease: Secondary | ICD-10-CM | POA: Diagnosis not present

## 2022-12-12 DIAGNOSIS — N186 End stage renal disease: Secondary | ICD-10-CM | POA: Diagnosis not present

## 2022-12-12 DIAGNOSIS — Z992 Dependence on renal dialysis: Secondary | ICD-10-CM | POA: Diagnosis not present

## 2022-12-12 DIAGNOSIS — D509 Iron deficiency anemia, unspecified: Secondary | ICD-10-CM | POA: Diagnosis not present

## 2022-12-14 DIAGNOSIS — N186 End stage renal disease: Secondary | ICD-10-CM | POA: Diagnosis not present

## 2022-12-14 DIAGNOSIS — D509 Iron deficiency anemia, unspecified: Secondary | ICD-10-CM | POA: Diagnosis not present

## 2022-12-14 DIAGNOSIS — Z992 Dependence on renal dialysis: Secondary | ICD-10-CM | POA: Diagnosis not present

## 2022-12-14 DIAGNOSIS — D631 Anemia in chronic kidney disease: Secondary | ICD-10-CM | POA: Diagnosis not present

## 2022-12-14 DIAGNOSIS — E876 Hypokalemia: Secondary | ICD-10-CM | POA: Diagnosis not present

## 2022-12-15 DIAGNOSIS — N186 End stage renal disease: Secondary | ICD-10-CM | POA: Diagnosis not present

## 2022-12-15 DIAGNOSIS — Z992 Dependence on renal dialysis: Secondary | ICD-10-CM | POA: Diagnosis not present

## 2022-12-15 DIAGNOSIS — D631 Anemia in chronic kidney disease: Secondary | ICD-10-CM | POA: Diagnosis not present

## 2022-12-15 DIAGNOSIS — D509 Iron deficiency anemia, unspecified: Secondary | ICD-10-CM | POA: Diagnosis not present

## 2022-12-15 DIAGNOSIS — E876 Hypokalemia: Secondary | ICD-10-CM | POA: Diagnosis not present

## 2022-12-19 ENCOUNTER — Emergency Department (HOSPITAL_COMMUNITY): Payer: Medicare Other

## 2022-12-19 ENCOUNTER — Other Ambulatory Visit: Payer: Self-pay

## 2022-12-19 ENCOUNTER — Inpatient Hospital Stay (HOSPITAL_COMMUNITY)
Admission: EM | Admit: 2022-12-19 | Discharge: 2022-12-22 | DRG: 193 | Disposition: A | Discharge status: Home Health | Payer: Medicare Other | Attending: Internal Medicine | Admitting: Internal Medicine

## 2022-12-19 ENCOUNTER — Encounter (HOSPITAL_COMMUNITY): Payer: Self-pay

## 2022-12-19 DIAGNOSIS — Z823 Family history of stroke: Secondary | ICD-10-CM | POA: Diagnosis not present

## 2022-12-19 DIAGNOSIS — J189 Pneumonia, unspecified organism: Secondary | ICD-10-CM | POA: Diagnosis not present

## 2022-12-19 DIAGNOSIS — N186 End stage renal disease: Secondary | ICD-10-CM

## 2022-12-19 DIAGNOSIS — Z1152 Encounter for screening for COVID-19: Secondary | ICD-10-CM | POA: Diagnosis not present

## 2022-12-19 DIAGNOSIS — Z992 Dependence on renal dialysis: Secondary | ICD-10-CM

## 2022-12-19 DIAGNOSIS — D631 Anemia in chronic kidney disease: Secondary | ICD-10-CM | POA: Diagnosis not present

## 2022-12-19 DIAGNOSIS — N2581 Secondary hyperparathyroidism of renal origin: Secondary | ICD-10-CM | POA: Diagnosis not present

## 2022-12-19 DIAGNOSIS — R29818 Other symptoms and signs involving the nervous system: Secondary | ICD-10-CM | POA: Diagnosis not present

## 2022-12-19 DIAGNOSIS — H548 Legal blindness, as defined in USA: Secondary | ICD-10-CM | POA: Diagnosis present

## 2022-12-19 DIAGNOSIS — R0602 Shortness of breath: Secondary | ICD-10-CM | POA: Diagnosis not present

## 2022-12-19 DIAGNOSIS — E1122 Type 2 diabetes mellitus with diabetic chronic kidney disease: Secondary | ICD-10-CM | POA: Diagnosis present

## 2022-12-19 DIAGNOSIS — Z809 Family history of malignant neoplasm, unspecified: Secondary | ICD-10-CM

## 2022-12-19 DIAGNOSIS — E7800 Pure hypercholesterolemia, unspecified: Secondary | ICD-10-CM | POA: Diagnosis present

## 2022-12-19 DIAGNOSIS — J90 Pleural effusion, not elsewhere classified: Secondary | ICD-10-CM | POA: Diagnosis not present

## 2022-12-19 DIAGNOSIS — Z820 Family history of epilepsy and other diseases of the nervous system: Secondary | ICD-10-CM

## 2022-12-19 DIAGNOSIS — Y95 Nosocomial condition: Secondary | ICD-10-CM | POA: Diagnosis present

## 2022-12-19 DIAGNOSIS — I10 Essential (primary) hypertension: Secondary | ICD-10-CM | POA: Diagnosis present

## 2022-12-19 DIAGNOSIS — Z8249 Family history of ischemic heart disease and other diseases of the circulatory system: Secondary | ICD-10-CM | POA: Diagnosis not present

## 2022-12-19 DIAGNOSIS — M898X9 Other specified disorders of bone, unspecified site: Secondary | ICD-10-CM | POA: Diagnosis present

## 2022-12-19 DIAGNOSIS — I120 Hypertensive chronic kidney disease with stage 5 chronic kidney disease or end stage renal disease: Secondary | ICD-10-CM | POA: Diagnosis present

## 2022-12-19 DIAGNOSIS — Z8673 Personal history of transient ischemic attack (TIA), and cerebral infarction without residual deficits: Secondary | ICD-10-CM | POA: Diagnosis not present

## 2022-12-19 DIAGNOSIS — K8020 Calculus of gallbladder without cholecystitis without obstruction: Secondary | ICD-10-CM | POA: Diagnosis not present

## 2022-12-19 DIAGNOSIS — Z833 Family history of diabetes mellitus: Secondary | ICD-10-CM | POA: Diagnosis not present

## 2022-12-19 DIAGNOSIS — Z7982 Long term (current) use of aspirin: Secondary | ICD-10-CM | POA: Diagnosis not present

## 2022-12-19 DIAGNOSIS — N281 Cyst of kidney, acquired: Secondary | ICD-10-CM | POA: Diagnosis not present

## 2022-12-19 DIAGNOSIS — Z79899 Other long term (current) drug therapy: Secondary | ICD-10-CM

## 2022-12-19 DIAGNOSIS — R531 Weakness: Secondary | ICD-10-CM | POA: Diagnosis not present

## 2022-12-19 DIAGNOSIS — R739 Hyperglycemia, unspecified: Secondary | ICD-10-CM | POA: Diagnosis not present

## 2022-12-19 DIAGNOSIS — R0789 Other chest pain: Secondary | ICD-10-CM | POA: Diagnosis not present

## 2022-12-19 DIAGNOSIS — E8770 Fluid overload, unspecified: Secondary | ICD-10-CM | POA: Diagnosis present

## 2022-12-19 DIAGNOSIS — R079 Chest pain, unspecified: Secondary | ICD-10-CM | POA: Diagnosis not present

## 2022-12-19 DIAGNOSIS — J810 Acute pulmonary edema: Secondary | ICD-10-CM | POA: Diagnosis not present

## 2022-12-19 DIAGNOSIS — E118 Type 2 diabetes mellitus with unspecified complications: Secondary | ICD-10-CM | POA: Diagnosis present

## 2022-12-19 HISTORY — DX: Dependence on renal dialysis: N18.6

## 2022-12-19 HISTORY — DX: Dependence on renal dialysis: Z99.2

## 2022-12-19 HISTORY — DX: Pneumonia, unspecified organism: J18.9

## 2022-12-19 LAB — COMPREHENSIVE METABOLIC PANEL
ALT: 7 U/L (ref 0–44)
AST: 14 U/L — ABNORMAL LOW (ref 15–41)
Albumin: 2.4 g/dL — ABNORMAL LOW (ref 3.5–5.0)
Alkaline Phosphatase: 94 U/L (ref 38–126)
Anion gap: 17 — ABNORMAL HIGH (ref 5–15)
BUN: 17 mg/dL (ref 8–23)
CO2: 22 mmol/L (ref 22–32)
Calcium: 8.7 mg/dL — ABNORMAL LOW (ref 8.9–10.3)
Chloride: 92 mmol/L — ABNORMAL LOW (ref 98–111)
Creatinine, Ser: 6.56 mg/dL — ABNORMAL HIGH (ref 0.61–1.24)
GFR, Estimated: 9 mL/min — ABNORMAL LOW (ref >60)
Glucose, Bld: 241 mg/dL — ABNORMAL HIGH (ref 70–99)
Potassium: 4.5 mmol/L (ref 3.5–5.1)
Sodium: 131 mmol/L — ABNORMAL LOW (ref 135–145)
Total Bilirubin: 0.8 mg/dL (ref 0.3–1.2)
Total Protein: 7.9 g/dL (ref 6.5–8.1)

## 2022-12-19 LAB — CBC WITH DIFFERENTIAL/PLATELET
Abs Immature Granulocytes: 0.03 10*3/uL (ref 0.00–0.07)
Basophils Absolute: 0.1 10*3/uL (ref 0.0–0.1)
Basophils Relative: 1 %
Eosinophils Absolute: 0.1 10*3/uL (ref 0.0–0.5)
Eosinophils Relative: 1 %
HCT: 34.2 % — ABNORMAL LOW (ref 39.0–52.0)
Hemoglobin: 10.2 g/dL — ABNORMAL LOW (ref 13.0–17.0)
Immature Granulocytes: 0 %
Lymphocytes Relative: 6 %
Lymphs Abs: 0.6 10*3/uL — ABNORMAL LOW (ref 0.7–4.0)
MCH: 23.7 pg — ABNORMAL LOW (ref 26.0–34.0)
MCHC: 29.8 g/dL — ABNORMAL LOW (ref 30.0–36.0)
MCV: 79.4 fL — ABNORMAL LOW (ref 80.0–100.0)
Monocytes Absolute: 0.8 10*3/uL (ref 0.1–1.0)
Monocytes Relative: 8 %
Neutro Abs: 8.2 10*3/uL — ABNORMAL HIGH (ref 1.7–7.7)
Neutrophils Relative %: 84 %
Platelets: 337 10*3/uL (ref 150–400)
RBC: 4.31 MIL/uL (ref 4.22–5.81)
RDW: 18.6 % — ABNORMAL HIGH (ref 11.5–15.5)
WBC: 9.7 10*3/uL (ref 4.0–10.5)
nRBC: 0 % (ref 0.0–0.2)

## 2022-12-19 LAB — I-STAT VENOUS BLOOD GAS, ED
Acid-Base Excess: 0 mmol/L (ref 0.0–2.0)
Bicarbonate: 22 mmol/L (ref 20.0–28.0)
Calcium, Ion: 0.93 mmol/L — ABNORMAL LOW (ref 1.15–1.40)
HCT: 35 % — ABNORMAL LOW (ref 39.0–52.0)
Hemoglobin: 11.9 g/dL — ABNORMAL LOW (ref 13.0–17.0)
O2 Saturation: 89 %
Potassium: 4.5 mmol/L (ref 3.5–5.1)
Sodium: 131 mmol/L — ABNORMAL LOW (ref 135–145)
TCO2: 23 mmol/L (ref 22–32)
pCO2, Ven: 26.5 mmHg — ABNORMAL LOW (ref 44–60)
pH, Ven: 7.527 — ABNORMAL HIGH (ref 7.25–7.43)
pO2, Ven: 49 mmHg — ABNORMAL HIGH (ref 32–45)

## 2022-12-19 LAB — TROPONIN I (HIGH SENSITIVITY)
Troponin I (High Sensitivity): 21 ng/L — ABNORMAL HIGH (ref <18)
Troponin I (High Sensitivity): 16 ng/L (ref <18)

## 2022-12-19 LAB — LIPASE, BLOOD: Lipase: 26 U/L (ref 11–51)

## 2022-12-19 LAB — D-DIMER, QUANTITATIVE: D-Dimer, Quant: 2.63 ug/mL-FEU — ABNORMAL HIGH (ref 0.00–0.50)

## 2022-12-19 LAB — GLUCOSE, CAPILLARY: Glucose-Capillary: 134 mg/dL — ABNORMAL HIGH (ref 70–99)

## 2022-12-19 LAB — MAGNESIUM: Magnesium: 2.6 mg/dL — ABNORMAL HIGH (ref 1.7–2.4)

## 2022-12-19 LAB — SARS CORONAVIRUS 2 BY RT PCR: SARS Coronavirus 2 by RT PCR: NEGATIVE

## 2022-12-19 LAB — BRAIN NATRIURETIC PEPTIDE: B Natriuretic Peptide: 1564.4 pg/mL — ABNORMAL HIGH (ref 0.0–100.0)

## 2022-12-19 LAB — BETA-HYDROXYBUTYRIC ACID: Beta-Hydroxybutyric Acid: 0.67 mmol/L — ABNORMAL HIGH (ref 0.05–0.27)

## 2022-12-19 LAB — MRSA NEXT GEN BY PCR, NASAL: MRSA by PCR Next Gen: NOT DETECTED

## 2022-12-19 LAB — HEPATITIS B SURFACE ANTIGEN: Hepatitis B Surface Ag: NONREACTIVE

## 2022-12-19 MED ORDER — SODIUM CHLORIDE 0.9 % IV SOLN: INTRAVENOUS | Status: DC | PRN

## 2022-12-19 MED ORDER — NEPRO/CARBSTEADY PO LIQD
237.0000 mL | Freq: Two times a day (BID) | ORAL | Status: DC
  Administered 2022-12-20 – 2022-12-22 (×5): 237 mL via ORAL

## 2022-12-19 MED ORDER — HEPARIN SODIUM (PORCINE) 1000 UNIT/ML DIALYSIS
4500.0000 [IU] | Freq: Once | INTRAMUSCULAR | Status: AC
  Administered 2022-12-19: 4500 [IU] via INTRAVENOUS_CENTRAL
  Filled 2022-12-19 (×2): qty 5

## 2022-12-19 MED ORDER — ANTICOAGULANT SODIUM CITRATE 4% (200MG/5ML) IV SOLN
5.0000 mL | Status: DC | PRN
  Filled 2022-12-19: qty 5

## 2022-12-19 MED ORDER — SODIUM CHLORIDE 0.9 % IV SOLN
500.0000 mg | INTRAVENOUS | Status: DC
  Filled 2022-12-19: qty 5

## 2022-12-19 MED ORDER — SODIUM CHLORIDE 0.9 % IV SOLN
2.0000 g | INTRAVENOUS | Status: DC
  Filled 2022-12-19: qty 20

## 2022-12-19 MED ORDER — ACETAMINOPHEN 325 MG PO TABS
650.0000 mg | ORAL_TABLET | Freq: Once | ORAL | Status: AC
  Administered 2022-12-19: 650 mg via ORAL
  Filled 2022-12-19: qty 2

## 2022-12-19 MED ORDER — INSULIN ASPART 100 UNIT/ML IJ SOLN
0.0000 [IU] | Freq: Three times a day (TID) | INTRAMUSCULAR | Status: DC
  Administered 2022-12-20: 1 [IU] via SUBCUTANEOUS
  Administered 2022-12-20: 4 [IU] via SUBCUTANEOUS
  Administered 2022-12-20 – 2022-12-21 (×2): 1 [IU] via SUBCUTANEOUS
  Administered 2022-12-22: 3 [IU] via SUBCUTANEOUS
  Administered 2022-12-22: 1 [IU] via SUBCUTANEOUS

## 2022-12-19 MED ORDER — DARBEPOETIN ALFA 100 MCG/0.5ML IJ SOSY: 100.0000 ug | PREFILLED_SYRINGE | INTRAMUSCULAR | Status: DC

## 2022-12-19 MED ORDER — INSULIN ASPART 100 UNIT/ML IJ SOLN
0.0000 [IU] | Freq: Every day | INTRAMUSCULAR | Status: DC
  Administered 2022-12-20 – 2022-12-21 (×2): 2 [IU] via SUBCUTANEOUS

## 2022-12-19 MED ORDER — HEPARIN SODIUM (PORCINE) 1000 UNIT/ML DIALYSIS
1000.0000 [IU] | INTRAMUSCULAR | Status: DC | PRN
  Administered 2022-12-19: 1000 [IU]
  Filled 2022-12-19 (×2): qty 1

## 2022-12-19 MED ORDER — HEPARIN SODIUM (PORCINE) 5000 UNIT/ML IJ SOLN
5000.0000 [IU] | Freq: Three times a day (TID) | INTRAMUSCULAR | Status: DC
  Administered 2022-12-19 – 2022-12-22 (×8): 5000 [IU] via SUBCUTANEOUS
  Filled 2022-12-19 (×9): qty 1

## 2022-12-19 MED ORDER — SODIUM CHLORIDE 0.9 % IV SOLN
500.0000 mg | INTRAVENOUS | Status: DC
  Administered 2022-12-20 – 2022-12-22 (×3): 500 mg via INTRAVENOUS
  Filled 2022-12-19 (×3): qty 5

## 2022-12-19 MED ORDER — PROCHLORPERAZINE EDISYLATE 10 MG/2ML IJ SOLN
5.0000 mg | Freq: Four times a day (QID) | INTRAMUSCULAR | Status: DC | PRN
  Administered 2022-12-20: 5 mg via INTRAVENOUS
  Filled 2022-12-19: qty 2

## 2022-12-19 MED ORDER — SODIUM CHLORIDE 0.9 % IV SOLN
500.0000 mg | Freq: Once | INTRAVENOUS | Status: AC
  Administered 2022-12-19: 500 mg via INTRAVENOUS
  Filled 2022-12-19: qty 5

## 2022-12-19 MED ORDER — HEPARIN SODIUM (PORCINE) 1000 UNIT/ML DIALYSIS
2000.0000 [IU] | INTRAMUSCULAR | Status: AC | PRN
  Administered 2022-12-19: 2000 [IU] via INTRAVENOUS_CENTRAL

## 2022-12-19 MED ORDER — ALTEPLASE 2 MG IJ SOLR: 2.0000 mg | Freq: Once | INTRAMUSCULAR | Status: DC | PRN

## 2022-12-19 MED ORDER — SODIUM CHLORIDE 0.9 % IV SOLN
2.0000 g | INTRAVENOUS | Status: DC
  Administered 2022-12-20 – 2022-12-22 (×3): 2 g via INTRAVENOUS
  Filled 2022-12-19 (×2): qty 20

## 2022-12-19 MED ORDER — CHLORHEXIDINE GLUCONATE CLOTH 2 % EX PADS
6.0000 | MEDICATED_PAD | Freq: Every day | CUTANEOUS | Status: DC
  Administered 2022-12-20 – 2022-12-21 (×2): 6 via TOPICAL

## 2022-12-19 MED ORDER — LANTHANUM CARBONATE 500 MG PO CHEW
1000.0000 mg | CHEWABLE_TABLET | Freq: Three times a day (TID) | ORAL | Status: DC
  Administered 2022-12-19 – 2022-12-20 (×4): 1000 mg via ORAL
  Filled 2022-12-19 (×6): qty 2

## 2022-12-19 MED ORDER — SODIUM CHLORIDE 0.9 % IV SOLN
1.0000 g | Freq: Once | INTRAVENOUS | Status: AC
  Administered 2022-12-19: 1 g via INTRAVENOUS
  Filled 2022-12-19: qty 10

## 2022-12-19 MED ORDER — IOHEXOL 350 MG/ML SOLN
80.0000 mL | Freq: Once | INTRAVENOUS | Status: AC | PRN
  Administered 2022-12-19: 80 mL via INTRAVENOUS

## 2022-12-19 NOTE — Progress Notes (Signed)
POST HD TX NOTE  12/19/22 1551  Vitals  Temp 97.9 F (36.6 C)  Temp Source Oral  BP (!) 154/75  MAP (mmHg) 98  BP Location Right Arm  BP Method Automatic  Patient Position (if appropriate) Lying  Pulse Rate  (not picking up at this time)  Pulse Rate Source Monitor  ECG Heart Rate 86  Resp 19  Oxygen Therapy  SpO2  (not picking up at this time, hands are cold)  O2 Device Nasal Cannula  O2 Flow Rate (L/min) 2 L/min  Pulse Oximetry Type Continuous  During Treatment Monitoring  Intra-Hemodialysis Comments (S)   (post HD tx VS check)  Post Treatment  Dialyzer Clearance Lightly streaked  Duration of HD Treatment -hour(s) 3.75 hour(s)  Hemodialysis Intake (mL) 0 mL  Liters Processed 90  Fluid Removed (mL) 3500 mL  Tolerated HD Treatment Yes  Post-Hemodialysis Comments (S)  tx completed w/o problem, UF goal met, blood rinsed back, VSS. Medication Admin: Heparin 4500 unit bolus at beginning of tx, Heparin 2000 units bolus mid tx, Heparin Dwells 3800 units  Hemodialysis Catheter Right Subclavian  No Placement Date or Time found.   Placed prior to admission: Yes  Orientation: Right  Access Location: Subclavian  Site Condition No complications  Blue Lumen Status Heparin locked;Dead end cap in place  Red Lumen Status Heparin locked;Dead end cap in place  Purple Lumen Status N/A  Catheter fill solution Heparin 1000 units/ml  Catheter fill volume (Arterial) 1.9 cc  Catheter fill volume (Venous) 1.9  Dressing Type Transparent  Dressing Status Antimicrobial disc in place;Clean, Dry, Intact  Drainage Description None  Dressing Change Due 12/26/22  Post treatment catheter status Capped and Clamped

## 2022-12-19 NOTE — ED Notes (Signed)
Pt transported to dialysis

## 2022-12-19 NOTE — ED Notes (Signed)
Patient transported to MRI 

## 2022-12-19 NOTE — ED Triage Notes (Signed)
Patient BIB by EMS from home due to weakness. Patient states weakness has been going on for 2 months but felt more weak today. Patient states legs almost gave out but didn't fall. Patient states intermittent chest tightness and SOB that has also been going on for 2 months. Patient doesn't require o2 at home. Patient CBG 365 but states he didn't take morning medicine. Patient dialysis MWF. Patient did dialysis on Saturday due to having a dr appt on Monday. Patient A&Ox4.

## 2022-12-19 NOTE — Consult Note (Signed)
Renal Service Consult Note [facility]  Shawn [name] 12/19/2022 Shawn Krabbe, MD Requesting Physician: Dr. Rush [name]  Reason for Consult: ESRD pt w/ gen'd weakness, CP and SOB HPI: The patient is a 62 y.o. year-old w/ PMH as below who presented to ED this am for c/o gen'd weakness, CP and SOB for last 2 months. Is not on home O2. Pt had a doctor's appt this Monday so missed his HD on Monday, they did an extra HD last Sat to make up for this (MWFSat last week).   Pt seen in room. No real c/o's other that what is outlined above. No recent HD or access issues, [facility] working well.   ROS - denies CP, no joint pain, no HA, no blurry vision, no rash, no diarrhea, no nausea/ vomiting, no dysuria, no difficulty voiding   Past Medical History  Past Medical History:  Diagnosis Date   Anemia    Arthritis    patient denies   Blind right eye    Chronic kidney disease    Dialysis since 2011 MWF   Complication of anesthesia    Diabetes mellitus    type II   Hypertension    Legally blind    No pertinent past medical history    PONV (postoperative nausea and vomiting)    N/V- became dehydrated had to have IV fluids- 01/2015   Shortness of breath dyspnea    when has too fluid   Stroke [facility]) 2012   date per patient   Syncope    Unspecified cerebral artery occlusion with cerebral infarction 04/02/2013   Past Surgical History  Past Surgical History:  Procedure Laterality Date   AV FISTULA PLACEMENT Right 05/12/2013   Procedure: INSERTION OF ARTERIOVENOUS (AV) GORE-TEX GRAFT ARM; ULTRASOUND GUIDED;  Surgeon: Fransisco Hertz, MD;  Location: [facility] OR;  Service: Vascular;  Laterality: Right;   AV FISTULA PLACEMENT Left 01/18/2015   Procedure: INSERTION OF ARTERIOVENOUS GORE-TEX GRAFT LEFT UPPER ARM;  Surgeon: Fransisco Hertz, MD;  Location: [facility] OR;  Service: Vascular;  Laterality: Left;   COLONOSCOPY  06/2012   EYE SURGERY Bilateral    retina surgery both eyes, cataract surgery both  eyes   HERNIA REPAIR     right inguinal   INSERTION OF DIALYSIS CATHETER Right    left arm graft  10/2010   LIGATION ARTERIOVENOUS GORTEX GRAFT Left 05/03/2015   Procedure: LIGATION ARTERIOVENOUS GORTEX GRAFT-LEFT UPPER ARM;  Surgeon: Fransisco Hertz, MD;  Location: [facility] OR;  Service: Vascular;  Laterality: Left;   Family History  Family History  Problem Relation Age of Onset   Diabetes Mother    Alzheimer's disease Mother    Cancer Father    Heart disease Father    Cancer Sister    Stroke Sister    Social History  reports that he has never smoked. He has never used smokeless tobacco. He reports that he does not drink alcohol and does not use drugs. Allergies No Known Allergies Home medications Prior to Admission medications   Medication Sig Start Date End Date Taking? Authorizing Provider  amLODipine (NORVASC) 5 MG tablet Take by mouth. 07/11/22  Yes [provider]  aspirin EC 325 MG tablet Take 325 mg by mouth at bedtime.   Yes [provider]  b complex vitamins tablet Take 1 tablet by mouth at bedtime.   Yes [provider]  brimonidine (ALPHAGAN) 0.2 % ophthalmic solution Place 1 drop into both eyes 2 (two) times daily.  11/05/22  Yes [provider]  calcitRIOL (ROCALTROL) 0.5 MCG capsule Take 1 capsule (0.5 mcg total) by mouth 3 (three) times a week. Patient taking differently: Take 0.5 mcg by mouth 3 (three) times a week. MON,WED,FRI 06/03/19  Yes Glade Lloyd, MD  diphenhydramine-acetaminophen (TYLENOL PM) 25-500 MG TABS tablet Take 1 tablet by mouth at bedtime as needed (pain).   Yes [provider]  dorzolamide-timolol (COSOPT) 22.3-6.8 MG/ML ophthalmic solution Place 1 drop into both eyes 2 (two) times daily.   Yes [provider]  erythromycin ophthalmic ointment Place 1 Application into both eyes at bedtime. 12/01/20  Yes [provider]  lanthanum (FOSRENOL) 1000 MG chewable tablet Chew 1,000 mg by mouth 3 (three)  times daily with meals.   Yes [provider]  latanoprost (XALATAN) 0.005 % ophthalmic solution Place 1 drop into both eyes at bedtime.    Yes [provider]  lisinopril (ZESTRIL) 20 MG tablet Take 20 mg by mouth 2 (two) times daily. 07/23/22  Yes [provider]  rosuvastatin (CRESTOR) 40 MG tablet Take 40 mg by mouth daily. 02/19/22  Yes [provider]  TRESIBA FLEXTOUCH 100 UNIT/ML FlexTouch Pen Inject 18 Units into the skin at bedtime. 10/04/22  Yes [provider]  metroNIDAZOLE (FLAGYL) 500 MG tablet Take 500 mg by mouth 2 (two) times daily. Patient not taking: Reported on 12/19/2022 10/03/22   [provider]     Vitals:   12/19/22 0627 12/19/22 0628 12/19/22 0745 12/19/22 0800  BP: (!) 152/77  (!) 155/80 (!) 164/80  Pulse: 99  91 91  Resp: (!) 21  (!) 22 20  Temp: 97.9 F (36.6 C)     SpO2: 92%  98% 98%  Weight:  74.8 kg    Height:  5\' 11"  (1.803 m)     Exam Gen alert, no distress, chronically ill appearing Lying flat w/ [place] O2, no ^wob No rash, cyanosis or gangrene Sclera anicteric, throat clear  +JVD Chest mild rales at bases, o/w clear RRR no RG Abd soft ntnd no mass or ascites +bs GU normal male MS no joint effusions or deformity Ext bilat 1+ pretib edema, no wounds or ulcers Neuro is alert, Ox 3 , nf    RIJ TDC in place     Home meds include - norvasc 5, asa, fosrenol 1 gm ac tid, lisinopril 20 bid, crestor, tresiba insulin, flagyl 500 bid, prns/ vits/ supps     [place] [facility]  3h  400/1.5   76.5kg  3K/2.5 bath  TDC   Heparin 6500 - last OP HD 6/08 (4 HD last week), post wt 76.1kg - coming a bit under  last 2 weeks - venofer 50mg  IV weekly - mircera 120 mcg IV q 2 wks, last 5/29, due today 6/12   CXR - bilat early pulm IS edema   Assessment/ Plan: CP/ SOB - w/ CXR showing sig infiltrates c/w pulm edema and/or infiltrates. IV abx started for poss pna. Will plan on HD early on 2nd shift today.   BP /volume - BP's are good, suspect he may be losing body wt. Will try to get a good wt after HD. Volume overload suspected. HD today ESRD - on HD MWF. Missed Monday HD. HD today next open slot.  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  MBD ckd - CCa in range, add on phos. Cont fosrenol as binder DM2 Legally  blind H/o CVA      Shawn Moselle  MD [facility] 12/19/2022, 8:28 AM  Recent Labs  Lab 12/19/22 0638 12/19/22 0652  HGB 10.2* 11.9*  ALBUMIN 2.4*  --   CALCIUM 8.7*  --   CREATININE 6.56*  --   K 4.5 4.5   Inpatient medications:   azithromycin 500 mg (12/19/22 0826)   cefTRIAXone (ROCEPHIN)  IV 1 g (12/19/22 4696)

## 2022-12-19 NOTE — ED Notes (Signed)
ED TO INPATIENT HANDOFF REPORT  ED Nurse Name and Phone #: Amil Amen [phone]  S Name/Age/Gender Shawn [name] 62 y.o. male Room/Bed: H014C/H014C  Code Status   Code Status: Full Code  Home/SNF/Other Home Patient oriented to: self, place, time, and situation Is this baseline? Yes   Triage Complete: Triage complete  Chief Complaint HCAP (healthcare-associated pneumonia) [J18.9]  Triage Note Patient BIB by EMS from home due to weakness. Patient states weakness has been going on for 2 months but felt more weak today. Patient states legs almost gave out but didn't fall. Patient states intermittent chest tightness and SOB that has also been going on for 2 months. Patient doesn't require o2 at home. Patient CBG 365 but states he didn't take morning medicine. Patient dialysis MWF. Patient did dialysis on Saturday due to having a dr appt on Monday. Patient A&Ox4.   Allergies No Known Allergies  Level of Care/Admitting Diagnosis ED Disposition     ED Disposition  Admit   Condition  --   Comment  Hospital Area: [facility] [[id number]]  Level of Care: Med-Surg [[id number]]  May admit patient to [facility] or [facility] if equivalent level of care is available:: No  Covid Evaluation: Confirmed COVID Negative  Diagnosis: HCAP (healthcare-associated pneumonia) [[id number]]  Admitting Physician: Rometta Emery [[id number]]  Attending Physician: Rometta Emery [[id number]]  Certification:: I certify this patient will need inpatient services for at least 2 midnights  Estimated Length of Stay: 3          B Medical/Surgery History Past Medical History:  Diagnosis Date   Anemia    Arthritis    patient denies   Blind right eye    Complication of anesthesia    Diabetes mellitus    type II   ESRD on hemodialysis (HCC)    Dialysis since 2011 MWF   HCAP (healthcare-associated pneumonia) 12/19/2022   Hypertension    Legally blind    No pertinent past medical history    PONV  (postoperative nausea and vomiting)    N/V- became dehydrated had to have IV fluids- 01/2015   Shortness of breath dyspnea    when has too fluid   Stroke (HCC) 07/09/2010   date per patient   Syncope    Unspecified cerebral artery occlusion with cerebral infarction 04/02/2013   Past Surgical History:  Procedure Laterality Date   AV FISTULA PLACEMENT Right 05/12/2013   Procedure: INSERTION OF ARTERIOVENOUS (AV) GORE-TEX GRAFT ARM; ULTRASOUND GUIDED;  Surgeon: Fransisco Hertz, MD;  Location: [facility] OR;  Service: Vascular;  Laterality: Right;   AV FISTULA PLACEMENT Left 01/18/2015   Procedure: INSERTION OF ARTERIOVENOUS GORE-TEX GRAFT LEFT UPPER ARM;  Surgeon: Fransisco Hertz, MD;  Location: [facility] OR;  Service: Vascular;  Laterality: Left;   COLONOSCOPY  06/2012   EYE SURGERY Bilateral    retina surgery both eyes, cataract surgery both eyes   HERNIA REPAIR     right inguinal   INSERTION OF DIALYSIS CATHETER Right    left arm graft  10/2010   LIGATION ARTERIOVENOUS GORTEX GRAFT Left 05/03/2015   Procedure: LIGATION ARTERIOVENOUS GORTEX GRAFT-LEFT UPPER ARM;  Surgeon: Fransisco Hertz, MD;  Location: [facility] OR;  Service: Vascular;  Laterality: Left;     A IV Location/Drains/Wounds Patient Lines/Drains/Airways Status     Active Line/Drains/Airways     Name Placement date Placement time Site Days   Peripheral IV 12/19/22 20 G Left;Posterior Hand 12/19/22  0638  Hand  less than  1   Peripheral IV 12/19/22 20 G Anterior;Right Forearm 12/19/22  0702  Forearm  less than 1   Fistula / Graft Right Upper arm Arteriovenous vein graft 05/12/13  0939  Upper arm  3508   Fistula / Graft Left Upper arm  01/18/15  0824  Upper arm  2892   Hemodialysis Catheter --  --  Subclavian  --   Hemodialysis Catheter Right Subclavian --  --  Subclavian  --   Hemodialysis Catheter Right Subclavian --  --  Subclavian  --            Intake/Output Last 24 hours  Intake/Output Summary (Last 24 hours) at 12/19/2022 1926 Last data  filed at 12/19/2022 1551 Gross per 24 hour  Intake --  Output 3500 ml  Net -3500 ml    Labs/Imaging Results for orders placed or performed during the hospital encounter of 12/19/22 (from the past 48 hour(s))  CBC with Differential     Status: Abnormal   Collection Time: 12/19/22  6:38 AM  Result Value Ref Range   WBC 9.7 4.0 - 10.5 K/uL   RBC 4.31 4.22 - 5.81 MIL/uL   Hemoglobin 10.2 (L) 13.0 - 17.0 g/dL   HCT 16.1 (L) 09.6 - 04.5 %   MCV 79.4 (L) 80.0 - 100.0 fL   MCH 23.7 (L) 26.0 - 34.0 pg   MCHC 29.8 (L) 30.0 - 36.0 g/dL   RDW 40.9 (H) 81.1 - 91.4 %   Platelets 337 150 - 400 K/uL   nRBC 0.0 0.0 - 0.2 %   Neutrophils Relative % 84 %   Neutro Abs 8.2 (H) 1.7 - 7.7 K/uL   Lymphocytes Relative 6 %   Lymphs Abs 0.6 (L) 0.7 - 4.0 K/uL   Monocytes Relative 8 %   Monocytes Absolute 0.8 0.1 - 1.0 K/uL   Eosinophils Relative 1 %   Eosinophils Absolute 0.1 0.0 - 0.5 K/uL   Basophils Relative 1 %   Basophils Absolute 0.1 0.0 - 0.1 K/uL   Immature Granulocytes 0 %   Abs Immature Granulocytes 0.03 0.00 - 0.07 K/uL    Comment: Performed at [facility] Lab, [address] [place], [place] [address]  Comprehensive metabolic panel     Status: Abnormal   Collection Time: 12/19/22  6:38 AM  Result Value Ref Range   Sodium 131 (L) 135 - 145 mmol/L   Potassium 4.5 3.5 - 5.1 mmol/L   Chloride 92 (L) 98 - 111 mmol/L   CO2 22 22 - 32 mmol/L   Glucose, Bld 241 (H) 70 - 99 mg/dL    Comment: Glucose reference range applies only to samples taken after fasting for at least 8 hours.   BUN 17 8 - 23 mg/dL   Creatinine, Ser 6.21 (H) 0.61 - 1.24 mg/dL   Calcium 8.7 (L) 8.9 - 10.3 mg/dL   Total Protein 7.9 6.5 - 8.1 g/dL   Albumin 2.4 (L) 3.5 - 5.0 g/dL   AST 14 (L) 15 - 41 U/L   ALT 7 0 - 44 U/L   Alkaline Phosphatase 94 38 - 126 U/L   Total Bilirubin 0.8 0.3 - 1.2 mg/dL   GFR, Estimated 9 (L) >60 mL/min    Comment: (NOTE) Calculated using the CKD-EPI Creatinine Equation (2021)    Anion  gap 17 (H) 5 - 15    Comment: Performed at [facility] Lab, [address] [place], [place] [address]  Troponin I (High Sensitivity)  Status: Abnormal   Collection Time: 12/19/22  6:38 AM  Result Value Ref Range   Troponin I (High Sensitivity) 21 (H) <18 ng/L    Comment: (NOTE) Elevated high sensitivity troponin I (hsTnI) values and significant  changes across serial measurements may suggest ACS but many other  chronic and acute conditions are known to elevate hsTnI results.  Refer to the "Links" section for chest pain algorithms and additional  guidance. Performed at [facility] Lab, [address] [place], [place] [address]   Magnesium     Status: Abnormal   Collection Time: 12/19/22  6:38 AM  Result Value Ref Range   Magnesium 2.6 (H) 1.7 - 2.4 mg/dL    Comment: Performed at [facility] Lab, [address] [place], [place] [address]  Brain natriuretic peptide     Status: Abnormal   Collection Time: 12/19/22  6:38 AM  Result Value Ref Range   B Natriuretic Peptide 1,564.4 (H) 0.0 - 100.0 pg/mL    Comment: Performed at [facility] Lab, [address], [place], [place] [address]  Lipase, blood     Status: None   Collection Time: 12/19/22  6:38 AM  Result Value Ref Range   Lipase 26 11 - 51 U/L    Comment: Performed at [facility] Lab, [address], [place], [place] [address]  I-Stat venous blood gas, [facility] ED, [facility], [facility])     Status: Abnormal   Collection Time: 12/19/22  6:52 AM  Result Value Ref Range   pH, Ven 7.527 (H) 7.25 - 7.43   pCO2, Ven 26.5 (L) 44 - 60 mmHg   pO2, Ven 49 (H) 32 - 45 mmHg   Bicarbonate 22.0 20.0 - 28.0 mmol/L   TCO2 23 22 - 32 mmol/L   O2 Saturation 89 %   Acid-Base Excess 0.0 0.0 - 2.0 mmol/L   Sodium 131 (L) 135 - 145 mmol/L   Potassium 4.5 3.5 - 5.1 mmol/L   Calcium, Ion 0.93 (L) 1.15 - 1.40 mmol/L   HCT 35.0 (L) 39.0 - 52.0 %   Hemoglobin 11.9 (L) 13.0 - 17.0 g/dL   Sample type VENOUS   D-dimer, quantitative     Status:  Abnormal   Collection Time: 12/19/22  6:58 AM  Result Value Ref Range   D-Dimer, Quant 2.63 (H) 0.00 - 0.50 ug/mL-FEU    Comment: (NOTE) At the manufacturer cut-off value of 0.5 g/mL FEU, this assay has a negative predictive value of 95-100%.This assay is intended for use in conjunction with a clinical pretest probability (PTP) assessment model to exclude pulmonary embolism (PE) and deep venous thrombosis (DVT) in outpatients suspected of PE or DVT. Results should be correlated with clinical presentation. Performed at [facility] Lab, [address], [place], [place] [address]   SARS Coronavirus 2 by RT PCR (hospital order, performed in [facility] hospital lab) *cepheid single result test* Anterior Nasal Swab     Status: None   Collection Time: 12/19/22  6:58 AM   Specimen: Anterior Nasal Swab  Result Value Ref Range   SARS Coronavirus 2 by RT PCR NEGATIVE NEGATIVE    Comment: Performed at [facility] Lab, [address], [place], [place] [address]  Beta-hydroxybutyric acid     Status: Abnormal   Collection Time: 12/19/22  8:06 AM  Result Value Ref Range   Beta-Hydroxybutyric Acid 0.67 (H) 0.05 - 0.27 mmol/L    Comment: Performed at [facility] Lab, [address], [place], [place] [address]  Troponin I (High Sensitivity)     Status: None   Collection Time: 12/19/22  8:24 AM  Result Value Ref Range   Troponin I (High Sensitivity) 16 <18 ng/L    Comment: (NOTE) Elevated high sensitivity troponin I (hsTnI) values and significant  changes across serial measurements may suggest ACS but many other  chronic and acute conditions are known to elevate hsTnI results.  Refer to the "Links" section for chest pain algorithms and additional  guidance. Performed at [facility] Lab, [address] [place], [place] [address]   Hepatitis B surface antigen     Status: None   Collection Time: 12/19/22 12:17 PM  Result Value Ref Range   Hepatitis B Surface Ag NON REACTIVE NON  REACTIVE    Comment: Performed at [facility] Lab, [address], [place], [place] [address]   CT Angio Chest PE W and/or Wo Contrast  Result Date: 12/19/2022 CLINICAL DATA:  Pulmonary embolism (PE) suspected, high prob; LLQ abdominal pain. EXAM: CT ANGIOGRAPHY CHEST CT ABDOMEN AND PELVIS WITH CONTRAST TECHNIQUE: Multidetector CT imaging of the chest was performed using the standard protocol during bolus administration of intravenous contrast. Multiplanar CT image reconstructions and MIPs were obtained to evaluate the vascular anatomy. Multidetector CT imaging of the abdomen and pelvis was performed using the standard protocol during bolus administration of intravenous contrast. RADIATION DOSE REDUCTION: This exam was performed according to the departmental dose-optimization program which includes automated exposure control, adjustment of the mA and/or kV according to patient size and/or use of iterative reconstruction technique. CONTRAST:  80mL OMNIPAQUE IOHEXOL 350 MG/ML SOLN COMPARISON:  Chest CT 08/03/2019. FINDINGS: CTA CHEST FINDINGS Cardiovascular: Satisfactory opacification of the pulmonary arteries to the segmental level. No evidence of pulmonary embolism. Normal heart size. No pericardial effusion. Aortic atherosclerosis. Coronary artery calcifications. Mediastinum/Nodes: Enlarged bilateral hilar and mediastinal lymph nodes, indeterminate. Trachea and esophagus are unremarkable. Lungs/Pleura: Moderate loculated bilateral pleural effusions with adjacent atelectasis throughout both lungs. Additional patchy airspace opacities (for example see image 53 and 64 series 6) may reflect focal atelectasis or be infectious, inflammatory or neoplastic in etiology. No pneumothorax. Musculoskeletal: No chest wall abnormality. No acute or significant osseous findings. Diffuse body wall edema. Review of the MIP images confirms the above findings. CT ABDOMEN and PELVIS FINDINGS Hepatobiliary: No focal liver  abnormality. Cholelithiasis without evidence of acute cholecystitis. No biliary dilatation. Pancreas: Unremarkable. No pancreatic ductal dilatation or surrounding inflammatory changes. Spleen: Normal. Adrenals/Urinary Tract: Adrenals are unremarkable. Bilateral renal cortical atrophy and simple cysts. No mass or hydronephrosis. Thick-walled bladder with mild surrounding fat stranding. Stomach/Bowel: Normal stomach and duodenum. No dilated loops of small bowel. Normal appendix is visualized on axial image 58 series 4. Colon is mostly decompressed. No bowel wall thickening or surrounding inflammation. Vascular/Lymphatic: Aortic atherosclerosis. No enlarged abdominal or pelvic lymph nodes. Reproductive: Prostate is unremarkable. Other: Diffuse body wall and presacral edema.  Trace pelvic ascites. Musculoskeletal: Sarcopenia.  No acute bony findings. Review of the MIP images confirms the above findings. IMPRESSION: CHEST CTA: 1. No evidence of pulmonary embolism. 2. Moderate loculated bilateral pleural effusions with adjacent atelectasis. 3. Additional patchy airspace opacities may reflect focal atelectasis or be infectious, inflammatory or neoplastic in etiology. 4. Enlarged bilateral hilar and mediastinal lymph nodes, indeterminate. CT ABDOMEN/PELVIS: 1. Thick-walled bladder with surrounding fat stranding concerning for cystitis. Correlate with urinalysis. 2. Diffuse body wall edema and trace pelvic ascites. 3. Cholelithiasis without evidence of acute cholecystitis. Aortic  Atherosclerosis (ICD10-I70.0). Electronically Signed   By: Orvan Falconer M.D.   On: 12/19/2022 11:57   CT ABDOMEN PELVIS W CONTRAST  Result Date: 12/19/2022 CLINICAL DATA:  Pulmonary embolism (PE) suspected, high prob; LLQ abdominal pain. EXAM: CT ANGIOGRAPHY CHEST CT ABDOMEN AND PELVIS WITH CONTRAST TECHNIQUE: Multidetector CT imaging of the chest was performed using the standard protocol during bolus administration of intravenous contrast.  Multiplanar CT image reconstructions and MIPs were obtained to evaluate the vascular anatomy. Multidetector CT imaging of the abdomen and pelvis was performed using the standard protocol during bolus administration of intravenous contrast. RADIATION DOSE REDUCTION: This exam was performed according to the departmental dose-optimization program which includes automated exposure control, adjustment of the mA and/or kV according to patient size and/or use of iterative reconstruction technique. CONTRAST:  80mL OMNIPAQUE IOHEXOL 350 MG/ML SOLN COMPARISON:  Chest CT 08/03/2019. FINDINGS: CTA CHEST FINDINGS Cardiovascular: Satisfactory opacification of the pulmonary arteries to the segmental level. No evidence of pulmonary embolism. Normal heart size. No pericardial effusion. Aortic atherosclerosis. Coronary artery calcifications. Mediastinum/Nodes: Enlarged bilateral hilar and mediastinal lymph nodes, indeterminate. Trachea and esophagus are unremarkable. Lungs/Pleura: Moderate loculated bilateral pleural effusions with adjacent atelectasis throughout both lungs. Additional patchy airspace opacities (for example see image 53 and 64 series 6) may reflect focal atelectasis or be infectious, inflammatory or neoplastic in etiology. No pneumothorax. Musculoskeletal: No chest wall abnormality. No acute or significant osseous findings. Diffuse body wall edema. Review of the MIP images confirms the above findings. CT ABDOMEN and PELVIS FINDINGS Hepatobiliary: No focal liver abnormality. Cholelithiasis without evidence of acute cholecystitis. No biliary dilatation. Pancreas: Unremarkable. No pancreatic ductal dilatation or surrounding inflammatory changes. Spleen: Normal. Adrenals/Urinary Tract: Adrenals are unremarkable. Bilateral renal cortical atrophy and simple cysts. No mass or hydronephrosis. Thick-walled bladder with mild surrounding fat stranding. Stomach/Bowel: Normal stomach and duodenum. No dilated loops of small bowel.  Normal appendix is visualized on axial image 58 series 4. Colon is mostly decompressed. No bowel wall thickening or surrounding inflammation. Vascular/Lymphatic: Aortic atherosclerosis. No enlarged abdominal or pelvic lymph nodes. Reproductive: Prostate is unremarkable. Other: Diffuse body wall and presacral edema.  Trace pelvic ascites. Musculoskeletal: Sarcopenia.  No acute bony findings. Review of the MIP images confirms the above findings. IMPRESSION: CHEST CTA: 1. No evidence of pulmonary embolism. 2. Moderate loculated bilateral pleural effusions with adjacent atelectasis. 3. Additional patchy airspace opacities may reflect focal atelectasis or be infectious, inflammatory or neoplastic in etiology. 4. Enlarged bilateral hilar and mediastinal lymph nodes, indeterminate. CT ABDOMEN/PELVIS: 1. Thick-walled bladder with surrounding fat stranding concerning for cystitis. Correlate with urinalysis. 2. Diffuse body wall edema and trace pelvic ascites. 3. Cholelithiasis without evidence of acute cholecystitis. Aortic Atherosclerosis (ICD10-I70.0). Electronically Signed   By: Orvan Falconer M.D.   On: 12/19/2022 11:57   MR BRAIN WO CONTRAST  Result Date: 12/19/2022 CLINICAL DATA:  Neuro deficit, acute, stroke suspected EXAM: MRI HEAD WITHOUT CONTRAST TECHNIQUE: Multiplanar, multiecho pulse sequences of the brain and surrounding structures were obtained without intravenous contrast. COMPARISON:  CT Head 07/20/22 FINDINGS: Brain: Negative for an acute infarct. No hemorrhage. No extra-axial fluid collection. No hydrocephalus. There is sequela of mild chronic microvascular ischemic change. Vascular: Normal flow voids. Skull and upper cervical spine: Normal marrow signal. Sinuses/Orbits: Trace bilateral mastoid effusions. No middle ear effusion. Paranasal sinuses are clear. Bilateral intra-ocular silicone injection and lens replacement. Orbits are otherwise unremarkable. Other: None. IMPRESSION: No acute intracranial  process. Electronically Signed   By: Elige Radon.D.  On: 12/19/2022 09:12   DG Chest 2 View  Result Date: 12/19/2022 CLINICAL DATA:  SOB EXAM: CHEST - 2 VIEW COMPARISON:  CXR 10/07/22 FINDINGS: Right-sided central venous catheter with tip in the right atrium. Small bilateral pleural effusions, unchanged to slightly improved from prior exam with interval resolution of the fissural fluid on the right. There is an apparent lucency along the paramediastinal aspect of the left upper lung, new from prior exam. Although this may be due to a resolving pleural effusion, pneumothorax is not excluded. Unchanged cardiac and mediastinal contours. Unchanged hazy opacity in the right lung base which could represent atelectasis or infection. No radiographically apparent displaced rib fractures. Visualized upper abdomen is unremarkable. Vertebral body heights are poorly assessed due to pulmonary interstitial opacities. IMPRESSION: 1. Small bilateral pleural effusions, unchanged to slightly improved from prior exam with interval resolution of the fissural fluid on the right. 2. Apparent lucency along the paramediastinal aspect of the left upper lung, new from prior exam. Although this may be due to a resolving pleural effusion, a pneumothorax is not excluded. Recommend short term follow up chest radiograph. 3. Unchanged hazy opacity in the right lung base which could represent atelectasis or infection. Electronically Signed   By: Lorenza Cambridge M.D.   On: 12/19/2022 07:45   CT Head Wo Contrast  Result Date: 12/19/2022 CLINICAL DATA:  Neuro deficit, acute, stroke suspected EXAM: CT HEAD WITHOUT CONTRAST TECHNIQUE: Contiguous axial images were obtained from the base of the skull through the vertex without intravenous contrast. RADIATION DOSE REDUCTION: This exam was performed according to the departmental dose-optimization program which includes automated exposure control, adjustment of the mA and/or kV according to patient  size and/or use of iterative reconstruction technique. COMPARISON:  CT head 05/25/19 FINDINGS: Brain: No evidence of hemorrhage, hydrocephalus, extra-axial collection or mass lesion/mass effect. There is sequela of mild overall chronic microvascular ischemic change. There may be a small age indeterminate infarct in the region of the cingulum on the right (series 2, image 23). Vascular: No hyperdense vessel or unexpected calcification. Skull: Normal. Negative for fracture or focal lesion. Sinuses/Orbits: No middle ear or mastoid effusion. Paranasal sinuses are clear. Postprocedural changes from bilateral intra-ocular silicone oil injection. Bilateral lens replacement. Orbits are otherwise unremarkable. Other: None. IMPRESSION: 1. No acute intracranial hemorrhage. 2. Possible small age indeterminate infarct in the region of the cingulum on the right. If there is concern for acute infarct, consider further evaluation with MRI. Electronically Signed   By: Lorenza Cambridge M.D.   On: 12/19/2022 07:28    Pending Labs Unresulted Labs (From admission, onward)     Start     Ordered   12/19/22 1000  Hepatitis B surface antibody,quantitative  (New Admission Hemo Labs (Hepatitis B))  Once,   URGENT        12/19/22 1002   12/19/22 0636  Urinalysis, Routine w reflex microscopic -Urine, Clean Catch  Once,   URGENT       Question:  Specimen Source  Answer:  Urine, Clean Catch   12/19/22 0635   Signed and Held  HIV Antibody (routine testing w rflx)  (HIV Antibody (Routine testing w reflex) panel)  Once,   R        Signed and Held   Signed and Held  CBC  (heparin)  Once,   R       Comments: Baseline for heparin therapy IF NOT ALREADY DRAWN.  Notify MD if PLT < 100 K.    Signed  and Held   Signed and Held  Creatinine, serum  (heparin)  Once,   R       Comments: Baseline for heparin therapy IF NOT ALREADY DRAWN.    Signed and Held   Signed and Held  Comprehensive metabolic panel  Tomorrow morning,   R        Signed  and Held   Signed and Held  CBC  Tomorrow morning,   R        Signed and Held            Vitals/Pain Today's Vitals   12/19/22 1547 12/19/22 1551 12/19/22 1629 12/19/22 1923  BP: (!) 151/84 (!) 154/75 (!) 158/80 (!) 160/75  Pulse:   88 78  Resp: 16 19 16 14   Temp:  97.9 F (36.6 C) 98 F (36.7 C)   TempSrc:  Oral Oral   SpO2:   100% 100%  Weight:      Height:      PainSc:  0-No pain 0-No pain 0-No pain    Isolation Precautions Airborne and Contact precautions  Medications Medications  Chlorhexidine Gluconate Cloth 2 % PADS 6 each (has no administration in time range)  lanthanum (FOSRENOL) chewable tablet 1,000 mg (1,000 mg Oral Given 12/19/22 1735)  acetaminophen (TYLENOL) tablet 650 mg (650 mg Oral Given 12/19/22 0750)  cefTRIAXone (ROCEPHIN) 1 g in sodium chloride 0.9 % 100 mL IVPB (0 g Intravenous Stopped 12/19/22 1039)  azithromycin (ZITHROMAX) 500 mg in sodium chloride 0.9 % 250 mL IVPB (0 mg Intravenous Stopped 12/19/22 1116)  heparin injection 4,500 Units (4,500 Units Dialysis Given 12/19/22 1155)  heparin injection 2,000 Units (2,000 Units Dialysis Given 12/19/22 1405)  iohexol (OMNIPAQUE) 350 MG/ML injection 80 mL (80 mLs Intravenous Contrast Given 12/19/22 1106)    Mobility walks with person assist     Focused Assessments Renal Assessment Handoff:  Hemodialysis Schedule: Hemodialysis Schedule: Monday/Wednesday/Friday Last Hemodialysis date and time: 12/19/2022 1000   Restricted appendage: right arm   R Recommendations: See Admitting Provider Note  Report given to:   Additional Notes: pt is blind

## 2022-12-19 NOTE — ED Provider Notes (Addendum)
[place] EMERGENCY DEPARTMENT AT [facility] Provider Note   CSN: [id number] Arrival date & time: 12/19/22  7829     History  Chief Complaint  Patient presents with   Weakness    Shawn [name] is a 62 y.o. male with a past medical history significant for previous CVA, hypertension, diabetes, ESRD on dialysis Monday/Wednesday/Friday who presents to the ED due to weakness, shortness of breath, and chest pain x2 months.  Patient states weakness is mostly in his legs and feels like his right is greater than his left.  Previous CVA with some weakness on his right side.  Notes he feels weaker on the right than his baseline.  Symptoms have been ongoing for the past 2 months.  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  He notes he sees shadows.  No change in vision over the past 2 months.  Denies speech changes.  No weakness in upper extremities.  Patient also endorses shortness of breath and chest pain.  No chest pain only occurs when he feels short of breath.  Describes chest pain as a sharp stabbing sensation.  Shortness of breath and chest pain typically occurs with exertion and when lying flat.  Denies lower extremity edema.  Not on any home oxygen.  No history of blood clots.  Patient last went to dialysis on Saturday due to a doctor's appointment on Monday.  He is due for dialysis today.  Denies fever and chills.  Admits to a cough.  History obtained from patient and past medical records. No interpreter used during encounter.       Home Medications Prior to Admission medications   Medication Sig Start Date End Date Taking? Authorizing Provider  amLODipine (NORVASC) 5 MG tablet Take 5 mg by mouth daily. 07/11/22  Yes [provider]  aspirin EC 325 MG tablet Take 325 mg by mouth at bedtime.   Yes [provider]  b complex vitamins tablet Take 1 tablet by mouth at bedtime.   Yes [provider]  brimonidine (ALPHAGAN) 0.2 % ophthalmic  solution Place 1 drop into both eyes 2 (two) times daily. 11/05/22  Yes [provider]  calcitRIOL (ROCALTROL) 0.5 MCG capsule Take 1 capsule (0.5 mcg total) by mouth 3 (three) times a week. Patient taking differently: Take 0.5 mcg by mouth every Monday, Wednesday, and Friday. 06/03/19  Yes Glade Lloyd, MD  diphenhydramine-acetaminophen (TYLENOL PM) 25-500 MG TABS tablet Take 1 tablet by mouth at bedtime as needed (pain).   Yes [provider]  dorzolamide-timolol (COSOPT) 22.3-6.8 MG/ML ophthalmic solution Place 1 drop into both eyes 2 (two) times daily.   Yes [provider]  erythromycin ophthalmic ointment Place 1 Application into both eyes at bedtime. 12/01/20  Yes [provider]  lanthanum (FOSRENOL) 1000 MG chewable tablet Chew 1,000 mg by mouth 3 (three) times daily with meals.   Yes [provider]  latanoprost (XALATAN) 0.005 % ophthalmic solution Place 1 drop into both eyes at bedtime.    Yes [provider]  lisinopril (ZESTRIL) 20 MG tablet Take 20 mg by mouth 2 (two) times daily. 07/23/22  Yes [provider]  rosuvastatin (CRESTOR) 40 MG tablet Take 40 mg by mouth daily. 02/19/22  Yes [provider]  TRESIBA FLEXTOUCH 100 UNIT/ML FlexTouch Pen Inject 18 Units into the skin at bedtime. 10/04/22  Yes [provider]  metroNIDAZOLE (FLAGYL) 500 MG tablet Take 500 mg by mouth 2 (two) times daily. Patient not taking:  Reported on 12/19/2022 10/03/22   [provider]      Allergies    Patient has no known allergies.    Review of Systems   Review of Systems  Constitutional:  Negative for chills and fever.  Respiratory:  Positive for cough and shortness of breath.   Cardiovascular:  Positive for chest pain. Negative for leg swelling.  Gastrointestinal:  Positive for abdominal pain.  Neurological:  Positive for weakness and headaches.    Physical Exam Updated Vital Signs BP 109/70 (BP Location:  Right Arm)   Pulse 78   Temp 97.7 F (36.5 C) (Oral)   Resp 13   Ht 5\' 11"  (1.803 m)   Wt 74.8 kg   SpO2 100%   BMI 23.00 kg/m  Physical Exam Vitals and nursing note reviewed.  Constitutional:      General: He is not in acute distress.    Appearance: He is not ill-appearing.  HENT:     Head: Normocephalic.  Eyes:     Pupils: Pupils are equal, round, and reactive to light.  Cardiovascular:     Rate and Rhythm: Normal rate and regular rhythm.     Pulses: Normal pulses.     Heart sounds: Normal heart sounds. No murmur heard.    No friction rub. No gallop.  Pulmonary:     Effort: Pulmonary effort is normal.     Breath sounds: Normal breath sounds.     Comments: Decreased breath sounds throughout.  Patient on 4.5 L nasal cannula with O2 saturation in the mid 90s.  Patient hypoxic in the mid 80s during initial evaluation on room air Abdominal:     General: Abdomen is flat. There is no distension.     Palpations: Abdomen is soft.     Tenderness: There is abdominal tenderness. There is no guarding or rebound.     Comments: Lower abdominal tenderness.  Musculoskeletal:        General: Normal range of motion.     Cervical back: Neck supple.  Skin:    General: Skin is warm and dry.  Neurological:     General: No focal deficit present.     Mental Status: He is alert.     Comments: No facial droop.  Normal speech.  Patient unable to see my fingers for EOMs.  He notes he just sees shadows.  Slight decreased strength in right lower extremity compared to left.  Psychiatric:        Mood and Affect: Mood normal.        Behavior: Behavior normal.     ED Results / Procedures / Treatments   Labs (all labs ordered are listed, but only abnormal results are displayed) Labs Reviewed  CBC WITH DIFFERENTIAL/PLATELET - Abnormal; Notable for the following components:      Result Value   Hemoglobin 10.2 (*)    HCT 34.2 (*)    MCV 79.4 (*)    MCH 23.7 (*)    MCHC 29.8 (*)    RDW 18.6 (*)     Neutro Abs 8.2 (*)    Lymphs Abs 0.6 (*)    All other components within normal limits  COMPREHENSIVE METABOLIC PANEL - Abnormal; Notable for the following components:   Sodium 131 (*)    Chloride 92 (*)    Glucose, Bld 241 (*)    Creatinine, Ser 6.56 (*)    Calcium 8.7 (*)    Albumin 2.4 (*)    AST 14 (*)  GFR, Estimated 9 (*)    Anion gap 17 (*)    All other components within normal limits  MAGNESIUM - Abnormal; Notable for the following components:   Magnesium 2.6 (*)    All other components within normal limits  D-DIMER, QUANTITATIVE - Abnormal; Notable for the following components:   D-Dimer, Quant 2.63 (*)    All other components within normal limits  BRAIN NATRIURETIC PEPTIDE - Abnormal; Notable for the following components:   B Natriuretic Peptide 1,564.4 (*)    All other components within normal limits  BETA-HYDROXYBUTYRIC ACID - Abnormal; Notable for the following components:   Beta-Hydroxybutyric Acid 0.67 (*)    All other components within normal limits  I-STAT VENOUS BLOOD GAS, ED - Abnormal; Notable for the following components:   pH, Ven 7.527 (*)    pCO2, Ven 26.5 (*)    pO2, Ven 49 (*)    Sodium 131 (*)    Calcium, Ion 0.93 (*)    HCT 35.0 (*)    Hemoglobin 11.9 (*)    All other components within normal limits  TROPONIN I (HIGH SENSITIVITY) - Abnormal; Notable for the following components:   Troponin I (High Sensitivity) 21 (*)    All other components within normal limits  SARS CORONAVIRUS 2 BY RT PCR  LIPASE, BLOOD  HEPATITIS B SURFACE ANTIGEN  URINALYSIS, ROUTINE W REFLEX MICROSCOPIC  HEPATITIS B SURFACE ANTIBODY, QUANTITATIVE  TROPONIN I (HIGH SENSITIVITY)    EKG EKG Interpretation  Date/Time:  Wednesday December 19 2022 06:37:56 EDT Ventricular Rate:  97 PR Interval:  144 QRS Duration: 104 QT Interval:  409 QTC Calculation: 520 R Axis:   25 Text Interpretation: Sinus rhythm Low voltage, extremity and precordial leads Consider anterior  infarct Abnormal T, consider ischemia, lateral leads Prolonged QT interval Baseline wander in lead(s) V4 V6 when compared to prior, longer QTc. No STEMI Confirmed by Theda Belfast ([id number]) on 12/19/2022 8:08:26 AM  Radiology CT Angio Chest PE W and/or Wo Contrast  Result Date: 12/19/2022 CLINICAL DATA:  Pulmonary embolism (PE) suspected, high prob; LLQ abdominal pain. EXAM: CT ANGIOGRAPHY CHEST CT ABDOMEN AND PELVIS WITH CONTRAST TECHNIQUE: Multidetector CT imaging of the chest was performed using the standard protocol during bolus administration of intravenous contrast. Multiplanar CT image reconstructions and MIPs were obtained to evaluate the vascular anatomy. Multidetector CT imaging of the abdomen and pelvis was performed using the standard protocol during bolus administration of intravenous contrast. RADIATION DOSE REDUCTION: This exam was performed according to the departmental dose-optimization program which includes automated exposure control, adjustment of the mA and/or kV according to patient size and/or use of iterative reconstruction technique. CONTRAST:  80mL OMNIPAQUE IOHEXOL 350 MG/ML SOLN COMPARISON:  Chest CT 08/03/2019. FINDINGS: CTA CHEST FINDINGS Cardiovascular: Satisfactory opacification of the pulmonary arteries to the segmental level. No evidence of pulmonary embolism. Normal heart size. No pericardial effusion. Aortic atherosclerosis. Coronary artery calcifications. Mediastinum/Nodes: Enlarged bilateral hilar and mediastinal lymph nodes, indeterminate. Trachea and esophagus are unremarkable. Lungs/Pleura: Moderate loculated bilateral pleural effusions with adjacent atelectasis throughout both lungs. Additional patchy airspace opacities (for example see image 53 and 64 series 6) may reflect focal atelectasis or be infectious, inflammatory or neoplastic in etiology. No pneumothorax. Musculoskeletal: No chest wall abnormality. No acute or significant osseous findings. Diffuse body wall  edema. Review of the MIP images confirms the above findings. CT ABDOMEN and PELVIS FINDINGS Hepatobiliary: No focal liver abnormality. Cholelithiasis without evidence of acute cholecystitis. No biliary dilatation. Pancreas: Unremarkable. No pancreatic ductal  dilatation or surrounding inflammatory changes. Spleen: Normal. Adrenals/Urinary Tract: Adrenals are unremarkable. Bilateral renal cortical atrophy and simple cysts. No mass or hydronephrosis. Thick-walled bladder with mild surrounding fat stranding. Stomach/Bowel: Normal stomach and duodenum. No dilated loops of small bowel. Normal appendix is visualized on axial image 58 series 4. Colon is mostly decompressed. No bowel wall thickening or surrounding inflammation. Vascular/Lymphatic: Aortic atherosclerosis. No enlarged abdominal or pelvic lymph nodes. Reproductive: Prostate is unremarkable. Other: Diffuse body wall and presacral edema.  Trace pelvic ascites. Musculoskeletal: Sarcopenia.  No acute bony findings. Review of the MIP images confirms the above findings. IMPRESSION: CHEST CTA: 1. No evidence of pulmonary embolism. 2. Moderate loculated bilateral pleural effusions with adjacent atelectasis. 3. Additional patchy airspace opacities may reflect focal atelectasis or be infectious, inflammatory or neoplastic in etiology. 4. Enlarged bilateral hilar and mediastinal lymph nodes, indeterminate. CT ABDOMEN/PELVIS: 1. Thick-walled bladder with surrounding fat stranding concerning for cystitis. Correlate with urinalysis. 2. Diffuse body wall edema and trace pelvic ascites. 3. Cholelithiasis without evidence of acute cholecystitis. Aortic Atherosclerosis (ICD10-I70.0). Electronically Signed   By: Orvan Falconer M.D.   On: 12/19/2022 11:57   CT ABDOMEN PELVIS W CONTRAST  Result Date: 12/19/2022 CLINICAL DATA:  Pulmonary embolism (PE) suspected, high prob; LLQ abdominal pain. EXAM: CT ANGIOGRAPHY CHEST CT ABDOMEN AND PELVIS WITH CONTRAST TECHNIQUE:  Multidetector CT imaging of the chest was performed using the standard protocol during bolus administration of intravenous contrast. Multiplanar CT image reconstructions and MIPs were obtained to evaluate the vascular anatomy. Multidetector CT imaging of the abdomen and pelvis was performed using the standard protocol during bolus administration of intravenous contrast. RADIATION DOSE REDUCTION: This exam was performed according to the departmental dose-optimization program which includes automated exposure control, adjustment of the mA and/or kV according to patient size and/or use of iterative reconstruction technique. CONTRAST:  80mL OMNIPAQUE IOHEXOL 350 MG/ML SOLN COMPARISON:  Chest CT 08/03/2019. FINDINGS: CTA CHEST FINDINGS Cardiovascular: Satisfactory opacification of the pulmonary arteries to the segmental level. No evidence of pulmonary embolism. Normal heart size. No pericardial effusion. Aortic atherosclerosis. Coronary artery calcifications. Mediastinum/Nodes: Enlarged bilateral hilar and mediastinal lymph nodes, indeterminate. Trachea and esophagus are unremarkable. Lungs/Pleura: Moderate loculated bilateral pleural effusions with adjacent atelectasis throughout both lungs. Additional patchy airspace opacities (for example see image 53 and 64 series 6) may reflect focal atelectasis or be infectious, inflammatory or neoplastic in etiology. No pneumothorax. Musculoskeletal: No chest wall abnormality. No acute or significant osseous findings. Diffuse body wall edema. Review of the MIP images confirms the above findings. CT ABDOMEN and PELVIS FINDINGS Hepatobiliary: No focal liver abnormality. Cholelithiasis without evidence of acute cholecystitis. No biliary dilatation. Pancreas: Unremarkable. No pancreatic ductal dilatation or surrounding inflammatory changes. Spleen: Normal. Adrenals/Urinary Tract: Adrenals are unremarkable. Bilateral renal cortical atrophy and simple cysts. No mass or hydronephrosis.  Thick-walled bladder with mild surrounding fat stranding. Stomach/Bowel: Normal stomach and duodenum. No dilated loops of small bowel. Normal appendix is visualized on axial image 58 series 4. Colon is mostly decompressed. No bowel wall thickening or surrounding inflammation. Vascular/Lymphatic: Aortic atherosclerosis. No enlarged abdominal or pelvic lymph nodes. Reproductive: Prostate is unremarkable. Other: Diffuse body wall and presacral edema.  Trace pelvic ascites. Musculoskeletal: Sarcopenia.  No acute bony findings. Review of the MIP images confirms the above findings. IMPRESSION: CHEST CTA: 1. No evidence of pulmonary embolism. 2. Moderate loculated bilateral pleural effusions with adjacent atelectasis. 3. Additional patchy airspace opacities may reflect focal atelectasis or be infectious, inflammatory or neoplastic in etiology. 4.  Enlarged bilateral hilar and mediastinal lymph nodes, indeterminate. CT ABDOMEN/PELVIS: 1. Thick-walled bladder with surrounding fat stranding concerning for cystitis. Correlate with urinalysis. 2. Diffuse body wall edema and trace pelvic ascites. 3. Cholelithiasis without evidence of acute cholecystitis. Aortic Atherosclerosis (ICD10-I70.0). Electronically Signed   By: Orvan Falconer M.D.   On: 12/19/2022 11:57   MR BRAIN WO CONTRAST  Result Date: 12/19/2022 CLINICAL DATA:  Neuro deficit, acute, stroke suspected EXAM: MRI HEAD WITHOUT CONTRAST TECHNIQUE: Multiplanar, multiecho pulse sequences of the brain and surrounding structures were obtained without intravenous contrast. COMPARISON:  CT Head 07/20/22 FINDINGS: Brain: Negative for an acute infarct. No hemorrhage. No extra-axial fluid collection. No hydrocephalus. There is sequela of mild chronic microvascular ischemic change. Vascular: Normal flow voids. Skull and upper cervical spine: Normal marrow signal. Sinuses/Orbits: Trace bilateral mastoid effusions. No middle ear effusion. Paranasal sinuses are clear. Bilateral  intra-ocular silicone injection and lens replacement. Orbits are otherwise unremarkable. Other: None. IMPRESSION: No acute intracranial process. Electronically Signed   By: Lorenza Cambridge M.D.   On: 12/19/2022 09:12   DG Chest 2 View  Result Date: 12/19/2022 CLINICAL DATA:  SOB EXAM: CHEST - 2 VIEW COMPARISON:  CXR 10/07/22 FINDINGS: Right-sided central venous catheter with tip in the right atrium. Small bilateral pleural effusions, unchanged to slightly improved from prior exam with interval resolution of the fissural fluid on the right. There is an apparent lucency along the paramediastinal aspect of the left upper lung, new from prior exam. Although this may be due to a resolving pleural effusion, pneumothorax is not excluded. Unchanged cardiac and mediastinal contours. Unchanged hazy opacity in the right lung base which could represent atelectasis or infection. No radiographically apparent displaced rib fractures. Visualized upper abdomen is unremarkable. Vertebral body heights are poorly assessed due to pulmonary interstitial opacities. IMPRESSION: 1. Small bilateral pleural effusions, unchanged to slightly improved from prior exam with interval resolution of the fissural fluid on the right. 2. Apparent lucency along the paramediastinal aspect of the left upper lung, new from prior exam. Although this may be due to a resolving pleural effusion, a pneumothorax is not excluded. Recommend short term follow up chest radiograph. 3. Unchanged hazy opacity in the right lung base which could represent atelectasis or infection. Electronically Signed   By: Lorenza Cambridge M.D.   On: 12/19/2022 07:45   CT Head Wo Contrast  Result Date: 12/19/2022 CLINICAL DATA:  Neuro deficit, acute, stroke suspected EXAM: CT HEAD WITHOUT CONTRAST TECHNIQUE: Contiguous axial images were obtained from the base of the skull through the vertex without intravenous contrast. RADIATION DOSE REDUCTION: This exam was performed according to  the departmental dose-optimization program which includes automated exposure control, adjustment of the mA and/or kV according to patient size and/or use of iterative reconstruction technique. COMPARISON:  CT head 05/25/19 FINDINGS: Brain: No evidence of hemorrhage, hydrocephalus, extra-axial collection or mass lesion/mass effect. There is sequela of mild overall chronic microvascular ischemic change. There may be a small age indeterminate infarct in the region of the cingulum on the right (series 2, image 23). Vascular: No hyperdense vessel or unexpected calcification. Skull: Normal. Negative for fracture or focal lesion. Sinuses/Orbits: No middle ear or mastoid effusion. Paranasal sinuses are clear. Postprocedural changes from bilateral intra-ocular silicone oil injection. Bilateral lens replacement. Orbits are otherwise unremarkable. Other: None. IMPRESSION: 1. No acute intracranial hemorrhage. 2. Possible small age indeterminate infarct in the region of the cingulum on the right. If there is concern for acute infarct, consider further evaluation with  MRI. Electronically Signed   By: Lorenza Cambridge M.D.   On: 12/19/2022 07:28    Procedures .Critical Care  Performed by: Mannie Stabile, PA-C Authorized by: Mannie Stabile, PA-C   Critical care provider statement:    Critical care time (minutes):  35   Critical care was necessary to treat or prevent imminent or life-threatening deterioration of the following conditions:  Respiratory failure   Critical care was time spent personally by me on the following activities:  Development of treatment plan with patient or surrogate, discussions with consultants, evaluation of patient's response to treatment, examination of patient, ordering and review of laboratory studies, ordering and review of radiographic studies, ordering and performing treatments and interventions, pulse oximetry, re-evaluation of patient's condition and review of old charts   I  assumed direction of critical care for this patient from another provider in my specialty: no     Care discussed with: admitting provider       Medications Ordered in ED Medications  Chlorhexidine Gluconate Cloth 2 % PADS 6 each (has no administration in time range)  heparin injection 4,500 Units (has no administration in time range)  heparin injection 2,000 Units (has no administration in time range)  lanthanum (FOSRENOL) chewable tablet 1,000 mg (has no administration in time range)  heparin injection 1,000 Units (has no administration in time range)  anticoagulant sodium citrate solution 5 mL (has no administration in time range)  alteplase (CATHFLO ACTIVASE) injection 2 mg (has no administration in time range)  acetaminophen (TYLENOL) tablet 650 mg (650 mg Oral Given 12/19/22 0750)  cefTRIAXone (ROCEPHIN) 1 g in sodium chloride 0.9 % 100 mL IVPB (0 g Intravenous Stopped 12/19/22 1039)  azithromycin (ZITHROMAX) 500 mg in sodium chloride 0.9 % 250 mL IVPB (0 mg Intravenous Stopped 12/19/22 1116)  iohexol (OMNIPAQUE) 350 MG/ML injection 80 mL (80 mLs Intravenous Contrast Given 12/19/22 1106)    ED Course/ Medical Decision Making/ A&P Clinical Course as of 12/19/22 1513  Wed Dec 19, 2022  0935 MRI negative for new CVA. [CA]    Clinical Course User Index [CA] Mannie Stabile, PA-C                             Medical Decision Making Amount and/or Complexity of Data Reviewed Labs: ordered. Radiology: ordered.  Risk OTC drugs. Prescription drug management. Decision regarding hospitalization.   This patient presents to the ED for concern of SOB, this involves an extensive number of treatment options, and is a complaint that carries with it a high risk of complications and morbidity.  The differential diagnosis includes CHF, PE, ACS, ESRD, etc  62 year old male presents to the ED due to lower extremity weakness, shortness of breath, and chest pain x 2 months.  Previous CVA.   Notes he feels weaker on his right compared to the left.  Patient states he has baseline right-sided weakness however, notes his weakness feels worse than baseline.  Patient also endorses shortness of breath and chest pain for the past 2 months.  Shortness of breath occurs with exertion and when lying flat.  No lower extremity edema.  Denies history of blood clots.  Also endorses a cough.  No fever or chills.  Upon arrival patient afebrile, hypoxic on 4.5 L nasal cannula and tachypneic.  Patient in no acute distress.  Physical exam significant for slight decrease strength in right lower extremity compared to left.  No facial droop.  5/5 strength to upper extremities.  Routine labs ordered.  Chest x-ray to rule out evidence of pneumonia versus pulmonary edema.  BNP to rule out CHF exacerbation.  COVID rule out infection.  CT head given neurological symptoms.  If CT head is negative may need to obtain MRI to rule out new CVA.  D-dimer to rule out PE.  Upon chart review patient admitted to the hospital in March 2024 for respiratory failure which was thought to be secondary to pulmonary edema which improved after dialysis.  CBC significant for anemia with hemoglobin at 10.2.  No leukocytosis.  Anemia appears to be better than baseline.  CMP significant for hyponatremia at 131.  Hyperglycemia at 241.  Slight elevation in anion gap at 17.  Creatinine elevated at 6.56 consistent with ESRD.  BG with elevated pH at 7.5.  Low CO2 at 26.  Normal bicarb.  VBG not consistent with DKA.  Lipase normal at 26.  Low suspicion for pancreatitis.  COVID-negative.  Chest x-ray personally reviewed and interpreted which demonstrates small bilateral pleural effusions and a lucency along the paramediastinal aspect of left lower lung which could be revolving pleural effusion versus pneumothorax.  Also demonstrates a hazy opacity right lung base likely atelectasis versus infection.  Given new onset cough and hypoxia will treat for  pneumonia with IV antibiotics  Suspect hypoxia multifactorial and secondary to pneumonia/pleural effusions vs. Need for dialysis.  8:03 AM Discussed with Dr. Arlean Hopping with nephrology who will arrange for patient to get dialysis today during his admission. Unclear whether hypoxia secondary to pleural effusion/pneumonia versus need for dialysis.  CTA chest negative for PE does demonstrate bilateral pleural effusions and patchy airspace opacities.  Antibiotics already started for possible pneumonia.  CT abdomen demonstrates thickened bladder. UA pending.   Patient taken to HD and will need to be reassessed after HD. If no longer hypoxic, he may be able to be discharged with oral antibiotics.    Addendum: critical care time due to respiratory failure Final Clinical Impression(s) / ED Diagnoses Final diagnoses:  Shortness of breath    Rx / DC Orders ED Discharge Orders     None         Jesusita Oka 12/19/22 1513    Tegeler, Canary Brim, MD 12/19/22 1600    Mannie Stabile, PA-C 12/20/22 9147    Tegeler, Canary Brim, MD 12/21/22 [phone]

## 2022-12-19 NOTE — ED Provider Notes (Signed)
  Physical Exam  BP (!) 158/80   Pulse 88   Temp 98 F (36.7 C) (Oral)   Resp 16   Ht 5\' 11"  (1.803 m)   Wt 74.8 kg Comment: weight is from ED, can't weigh up here d/t pt on ED stretcher that won't weigh and pt can't stand to weigh  SpO2 100%   BMI 23.00 kg/m   Physical Exam Vitals and nursing note reviewed.  Constitutional:      Appearance: Normal appearance.  HENT:     Head: Normocephalic and atraumatic.     Mouth/Throat:     Mouth: Mucous membranes are moist.  Cardiovascular:     Rate and Rhythm: Normal rate.  Pulmonary:     Effort: Pulmonary effort is normal.     Breath sounds: Rales present. No wheezing.  Abdominal:     General: Abdomen is flat.  Musculoskeletal:     Cervical back: Normal range of motion and neck supple.  Skin:    General: Skin is warm and dry.  Neurological:     Mental Status: He is alert and oriented to person, place, and time.     Procedures  Procedures  ED Course / MDM   Clinical Course as of 12/19/22 1834  Wed Dec 19, 2022  0935 MRI negative for new CVA. [CA]    Clinical Course User Index [CA] Mannie Stabile, PA-C   Medical Decision Making Amount and/or Complexity of Data Reviewed Labs: ordered. Radiology: ordered.  Risk OTC drugs. Prescription drug management. Decision regarding hospitalization.    Patient returns to the ED from dialysis.  Patient seen earlier in the emergency department for weakness, found to have bilateral pneumonia, some loculated effusions, also had been having a cough but symptoms have been ongoing for the past 2 months.  Prior recommendations were for admission versus discharge home.  Patient was weaned off oxygen by me while in the emergency department maintaining his oxygen saturation for about an hour of 98% to 100%.  He does report feeling better after dialysis, will try to ambulate to make sure patient does not the status he is at high risk for decompensation.  6:34 PM Spoke to Dr. Mikeal Hawthorne,  hospitalist service, appreciate his assistance.  Patient will be admitted for further management.    Portions of this note were generated with Scientist, clinical (histocompatibility and immunogenetics). Dictation errors may occur despite best attempts at proofreading.         Claude Manges, PA-C 12/19/22 1834    Rolan Bucco, MD 12/19/22 [phone]

## 2022-12-19 NOTE — ED Notes (Addendum)
Pt ambulated with 1 assist and cane. Pulse ox was 95% on RA while ambulating but he looked very unsteady and weak and he stated that he felt weaker than normal. He stated that he felt tired after a very short walk.

## 2022-12-19 NOTE — Progress Notes (Signed)
New Admission Note:   Arrival Method: Arrived from [facility] ED via stretcher Mental Orientation: Alert and oriented x4 Telemetry: N/A Assessment: Completed Skin: Intact IV: Lt Hand,Rt FA Pain: 0/10 Tubes: N/A Safety Measures: Safety Fall Prevention Plan has been discussed.  Admission: Completed Orientation: Patient has been oriented to the room, unit and staff.  Family: None at bedside  Orders have been reviewed and implemented. Will continue to monitor the patient. Call light has been placed within reach and bed alarm has been activated.   Sharnay Cashion [employer], RN-BC Phone number: [phone]

## 2022-12-19 NOTE — Procedures (Signed)
I was present at this dialysis session, have reviewed the session and made  appropriate changes Vinson Moselle MD  [facility] 12/19/2022, 3:14 PM

## 2022-12-19 NOTE — H&P (Signed)
History and Physical    Patient: Shawn [name] DOB: 01-Oct-1960 DOA: 12/19/2022 DOS: the patient was seen and examined on 12/19/2022 PCP: Corine Shelter, MD  Patient coming from: Home  Chief Complaint:  Chief Complaint  Patient presents with   Weakness   HPI: Shawn [name] is a 62 y.o. male with medical history significant of end-stage renal disease on hemodialysis Mondays Wednesdays and Fridays, essential hypertension, diabetes, hyperlipidemia, previous CVA, legally blind with frequent orthostatic hypotension who presented to the ER after sustaining generalized weakness shortness of breath on his way to dialysis today.  Patient wanted to get dialyzed today but was so weak that he was brought to the ER.  Workup in the ER showed bilateral pneumonia with some loculated effusion.  He has been having cough for the last 2 months apparently.  Initially plan was to admit the patient but he was weaned off oxygen discharge had his dialysis but then symptoms persisted.  Patient came back to the ER where he was ambulated but could not make it home due to weakness so is being admitted for treatment of his pneumonia presumably healthcare associated pneumonia.  Review of Systems: As mentioned in the history of present illness. All other systems reviewed and are negative. Past Medical History:  Diagnosis Date   Anemia    Arthritis    patient denies   Blind right eye    Complication of anesthesia    Diabetes mellitus    type II   ESRD on hemodialysis [facility])    Dialysis since 2011 MWF   HCAP (healthcare-associated pneumonia) 12/19/2022   Hypertension    Legally blind    No pertinent past medical history    PONV (postoperative nausea and vomiting)    N/V- became dehydrated had to have IV fluids- 01/2015   Shortness of breath dyspnea    when has too fluid   Stroke (HCC) 07/09/2010   date per patient   Syncope    Unspecified cerebral artery occlusion with cerebral  infarction 04/02/2013   Past Surgical History:  Procedure Laterality Date   AV FISTULA PLACEMENT Right 05/12/2013   Procedure: INSERTION OF ARTERIOVENOUS (AV) GORE-TEX GRAFT ARM; ULTRASOUND GUIDED;  Surgeon: Fransisco Hertz, MD;  Location: [facility] OR;  Service: Vascular;  Laterality: Right;   AV FISTULA PLACEMENT Left 01/18/2015   Procedure: INSERTION OF ARTERIOVENOUS GORE-TEX GRAFT LEFT UPPER ARM;  Surgeon: Fransisco Hertz, MD;  Location: [facility] OR;  Service: Vascular;  Laterality: Left;   COLONOSCOPY  06/2012   EYE SURGERY Bilateral    retina surgery both eyes, cataract surgery both eyes   HERNIA REPAIR     right inguinal   INSERTION OF DIALYSIS CATHETER Right    left arm graft  10/2010   LIGATION ARTERIOVENOUS GORTEX GRAFT Left 05/03/2015   Procedure: LIGATION ARTERIOVENOUS GORTEX GRAFT-LEFT UPPER ARM;  Surgeon: Fransisco Hertz, MD;  Location: [facility] OR;  Service: Vascular;  Laterality: Left;   Social History:  reports that he has never smoked. He has never used smokeless tobacco. He reports that he does not drink alcohol and does not use drugs.  No Known Allergies  Family History  Problem Relation Age of Onset   Diabetes Mother    Alzheimer's disease Mother    Cancer Father    Heart disease Father    Cancer Sister    Stroke Sister     Prior to Admission medications   Medication Sig Start Date End Date Taking? Authorizing Provider  amLODipine (NORVASC)  5 MG tablet Take 5 mg by mouth daily. 07/11/22  Yes [provider]  aspirin EC 325 MG tablet Take 325 mg by mouth at bedtime.   Yes [provider]  b complex vitamins tablet Take 1 tablet by mouth at bedtime.   Yes [provider]  brimonidine (ALPHAGAN) 0.2 % ophthalmic solution Place 1 drop into both eyes 2 (two) times daily. 11/05/22  Yes [provider]  calcitRIOL (ROCALTROL) 0.5 MCG capsule Take 1 capsule (0.5 mcg total) by mouth 3 (three) times a week. Patient taking differently: Take 0.5 mcg by mouth every  Monday, Wednesday, and Friday. 06/03/19  Yes Glade Lloyd, MD  diphenhydramine-acetaminophen (TYLENOL PM) 25-500 MG TABS tablet Take 1 tablet by mouth at bedtime as needed (pain).   Yes [provider]  dorzolamide-timolol (COSOPT) 22.3-6.8 MG/ML ophthalmic solution Place 1 drop into both eyes 2 (two) times daily.   Yes [provider]  erythromycin ophthalmic ointment Place 1 Application into both eyes at bedtime. 12/01/20  Yes [provider]  lanthanum (FOSRENOL) 1000 MG chewable tablet Chew 1,000 mg by mouth 3 (three) times daily with meals.   Yes [provider]  latanoprost (XALATAN) 0.005 % ophthalmic solution Place 1 drop into both eyes at bedtime.    Yes [provider]  lisinopril (ZESTRIL) 20 MG tablet Take 20 mg by mouth 2 (two) times daily. 07/23/22  Yes [provider]  rosuvastatin (CRESTOR) 40 MG tablet Take 40 mg by mouth daily. 02/19/22  Yes [provider]  TRESIBA FLEXTOUCH 100 UNIT/ML FlexTouch Pen Inject 18 Units into the skin at bedtime. 10/04/22  Yes [provider]  metroNIDAZOLE (FLAGYL) 500 MG tablet Take 500 mg by mouth 2 (two) times daily. Patient not taking: Reported on 12/19/2022 10/03/22   [provider]    Physical Exam: Vitals:   12/19/22 1530 12/19/22 1547 12/19/22 1551 12/19/22 1629  BP: (!) 144/78 (!) 151/84 (!) 154/75 (!) 158/80  Pulse: 84   88  Resp: 15 16 19 16   Temp:   97.9 F (36.6 C) 98 F (36.7 C)  TempSrc:   Oral Oral  SpO2: 100%   100%  Weight:      Height:       Constitutional: Bilateral blindness, weak, laying in bed NAD, calm, comfortable Eyes: PERRL, lids and conjunctivae normal ENMT: Mucous membranes are moist. Posterior pharynx clear of any exudate or lesions.Normal dentition.  Neck: normal, supple, no masses, no thyromegaly Respiratory: clear to auscultation bilaterally, no wheezing, no crackles. Normal respiratory effort. No accessory muscle use.   Cardiovascular: Regular rate and rhythm, no murmurs / rubs / gallops. No extremity edema. 2+ pedal pulses. No carotid bruits.  Abdomen: no tenderness, no masses palpated. No hepatosplenomegaly. Bowel sounds positive.  Musculoskeletal: Good range of motion, no joint swelling or tenderness, Skin: no rashes, lesions, ulcers. No induration Neurologic: CN 2-12 grossly intact. Sensation intact, DTR normal. Strength 5/5 in all 4.  Psychiatric: Normal judgment and insight. Alert and oriented x 3. Normal mood  Data Reviewed:  Sodium 131 potassium 4.5 chloride 92 CO2 22 glucose 241.  BUN 17 creatinine 6.56.  Magnesium 2.6.  BNP 1564.  Troponin 21.  Hemoglobin 12 and platelets 337.  D-dimer 2.63.  COVID-19 is negative.  White count is 9.7.  Head CT without contrast showed no acute intracranial findings.  Possible small age-indeterminate infarct in the region of singular chest x-ray showed small bilateral pleural effusions on change there is apparent lucency  along the paranoid mediastinal x-ray of the left upper lung and also right lung base infiltrates  Assessment and Plan:  #1 generalized weakness: Most likely related to pneumonia.  Patient also has age-indeterminate infarct but has known history of previous CVA which could reflect this.  He is already on treatment for that.  Will admit the patient.  Treat for pneumonia.  PT OT.  If unable to walk we may have to do further CVA workup analysis.  #2 HCAP: Will treat with IV Rocephin and Zithromax.  Follow blood cultures.  #3 end-stage renal disease: On hemodialysis Monday Wednesday and Fridays.  Patient was dialyzed today.  #4 diabetes: Initiate sliding scale insulin  #5 essential hypertension: Blood pressure controlled.  Continue monitoring.  #6 bilateral blindness: Legally blind.  Continue supportive care.  #7 hyperlipidemia: Continue statin    Advance Care Planning:   Code Status: Full Code   Consults: Dr. Bettina Gavia, nephrology  Family  Communication: No family at bedside  Severity of Illness: The appropriate patient status for this patient is INPATIENT. Inpatient status is judged to be reasonable and necessary in order to provide the required intensity of service to ensure the patient's safety. The patient's presenting symptoms, physical exam findings, and initial radiographic and laboratory data in the context of their chronic comorbidities is felt to place them at high risk for further clinical deterioration. Furthermore, it is not anticipated that the patient will be medically stable for discharge from the hospital within 2 midnights of admission.   * I certify that at the point of admission it is my clinical judgment that the patient will require inpatient hospital care spanning beyond 2 midnights from the point of admission due to high intensity of service, high risk for further deterioration and high frequency of surveillance required.*  AuthorLonia Blood, MD 12/19/2022 6:48 PM  For on call review [email or web address].

## 2022-12-20 DIAGNOSIS — J189 Pneumonia, unspecified organism: Secondary | ICD-10-CM | POA: Diagnosis not present

## 2022-12-20 LAB — COMPREHENSIVE METABOLIC PANEL
ALT: 6 U/L (ref 0–44)
AST: 12 U/L — ABNORMAL LOW (ref 15–41)
Albumin: 2.3 g/dL — ABNORMAL LOW (ref 3.5–5.0)
Alkaline Phosphatase: 99 U/L (ref 38–126)
Anion gap: 12 (ref 5–15)
BUN: 10 mg/dL (ref 8–23)
CO2: 24 mmol/L (ref 22–32)
Calcium: 8.2 mg/dL — ABNORMAL LOW (ref 8.9–10.3)
Chloride: 96 mmol/L — ABNORMAL LOW (ref 98–111)
Creatinine, Ser: 4.39 mg/dL — ABNORMAL HIGH (ref 0.61–1.24)
GFR, Estimated: 15 mL/min — ABNORMAL LOW (ref >60)
Glucose, Bld: 184 mg/dL — ABNORMAL HIGH (ref 70–99)
Potassium: 3.6 mmol/L (ref 3.5–5.1)
Sodium: 132 mmol/L — ABNORMAL LOW (ref 135–145)
Total Bilirubin: 0.8 mg/dL (ref 0.3–1.2)
Total Protein: 7.7 g/dL (ref 6.5–8.1)

## 2022-12-20 LAB — CBC
HCT: 30.8 % — ABNORMAL LOW (ref 39.0–52.0)
Hemoglobin: 9.5 g/dL — ABNORMAL LOW (ref 13.0–17.0)
MCH: 24.4 pg — ABNORMAL LOW (ref 26.0–34.0)
MCHC: 30.8 g/dL (ref 30.0–36.0)
MCV: 79 fL — ABNORMAL LOW (ref 80.0–100.0)
Platelets: 318 10*3/uL (ref 150–400)
RBC: 3.9 MIL/uL — ABNORMAL LOW (ref 4.22–5.81)
RDW: 18.8 % — ABNORMAL HIGH (ref 11.5–15.5)
WBC: 8.4 10*3/uL (ref 4.0–10.5)
nRBC: 0 % (ref 0.0–0.2)

## 2022-12-20 LAB — GLUCOSE, CAPILLARY
Glucose-Capillary: 171 mg/dL — ABNORMAL HIGH (ref 70–99)
Glucose-Capillary: 177 mg/dL — ABNORMAL HIGH (ref 70–99)
Glucose-Capillary: 309 mg/dL — ABNORMAL HIGH (ref 70–99)
Glucose-Capillary: 207 mg/dL — ABNORMAL HIGH (ref 70–99)

## 2022-12-20 LAB — HIV ANTIBODY (ROUTINE TESTING W REFLEX): HIV Screen 4th Generation wRfx: NONREACTIVE

## 2022-12-20 MED ORDER — LATANOPROST 0.005 % OP SOLN
1.0000 [drp] | Freq: Every day | OPHTHALMIC | Status: DC
  Administered 2022-12-20 – 2022-12-21 (×2): 1 [drp] via OPHTHALMIC
  Filled 2022-12-20: qty 2.5

## 2022-12-20 MED ORDER — DARBEPOETIN ALFA 100 MCG/0.5ML IJ SOSY
100.0000 ug | PREFILLED_SYRINGE | INTRAMUSCULAR | Status: DC
  Administered 2022-12-21: 100 ug via SUBCUTANEOUS
  Filled 2022-12-20: qty 0.5

## 2022-12-20 MED ORDER — DORZOLAMIDE HCL-TIMOLOL MAL 2-0.5 % OP SOLN
1.0000 [drp] | Freq: Two times a day (BID) | OPHTHALMIC | Status: DC
  Administered 2022-12-20 – 2022-12-22 (×4): 1 [drp] via OPHTHALMIC
  Filled 2022-12-20: qty 10

## 2022-12-20 MED ORDER — ERYTHROMYCIN 5 MG/GM OP OINT
TOPICAL_OINTMENT | Freq: Every day | OPHTHALMIC | Status: DC
  Filled 2022-12-20: qty 3.5

## 2022-12-20 MED ORDER — CHLORHEXIDINE GLUCONATE CLOTH 2 % EX PADS
6.0000 | MEDICATED_PAD | Freq: Every day | CUTANEOUS | Status: DC
  Administered 2022-12-22: 6 via TOPICAL

## 2022-12-20 MED ORDER — BRIMONIDINE TARTRATE 0.2 % OP SOLN
1.0000 [drp] | Freq: Two times a day (BID) | OPHTHALMIC | Status: DC
  Administered 2022-12-20 – 2022-12-22 (×4): 1 [drp] via OPHTHALMIC
  Filled 2022-12-20: qty 5

## 2022-12-20 NOTE — Progress Notes (Signed)
[facility] Progress Note   Subjective:   Patient seen and examined at bedside.  Reports nausea this AM and weakness. Appetite has been poor for a while.  Mild chest pain mostly associated with movement/breathing.  Denies SOB, abdominal pain, vomiting and diarrhea.    Objective Vitals:   12/20/22 0015 12/20/22 0525 12/20/22 0734 12/20/22 0842  BP: (!) 171/77 (!) 176/83  (!) 157/70  Pulse: 97 95  98  Resp: 18 18  18   Temp: 98.3 F (36.8 C) 97.9 F (36.6 C)  97.8 F (36.6 C)  TempSrc: Oral     SpO2: 95% 95%  95%  Weight:   71.8 kg   Height:       Physical Exam General:chronically ill appearing male in NAD Heart:RRR, no mrg Lungs:breath sounds decreased, nml WOB on RA Abdomen:soft, NTND Extremities:no LE edema Dialysis Access: [facility]   Filed Weights   12/19/22 1138 12/19/22 2007 12/20/22 0734  Weight: 74.8 kg 71.5 kg 71.8 kg    Intake/Output Summary (Last 24 hours) at 12/20/2022 1210 Last data filed at 12/20/2022 0906 Gross per 24 hour  Intake 120 ml  Output 3500 ml  Net -3380 ml    Additional Objective Labs: Basic Metabolic Panel: Recent Labs  Lab 12/19/22 0638 12/19/22 0652 12/20/22 0900  NA 131* 131* 132*  K 4.5 4.5 3.6  CL 92*  --  96*  CO2 22  --  24  GLUCOSE 241*  --  184*  BUN 17  --  10  CREATININE 6.56*  --  4.39*  CALCIUM 8.7*  --  8.2*   Liver Function Tests: Recent Labs  Lab 12/19/22 0638 12/20/22 0900  AST 14* 12*  ALT 7 6  ALKPHOS 94 99  BILITOT 0.8 0.8  PROT 7.9 7.7  ALBUMIN 2.4* 2.3*   Recent Labs  Lab 12/19/22 0638  LIPASE 26   CBC: Recent Labs  Lab 12/19/22 0638 12/19/22 0652 12/20/22 0900  WBC 9.7  --  8.4  NEUTROABS 8.2*  --   --   HGB 10.2* 11.9* 9.5*  HCT 34.2* 35.0* 30.8*  MCV 79.4*  --  79.0*  PLT 337  --  318   CBG: Recent Labs  Lab 12/19/22 2042 12/20/22 0722 12/20/22 1145  GLUCAP 134* 171* 177*    Studies/Results: CT Angio Chest PE W and/or Wo Contrast  Result Date:  12/19/2022 CLINICAL DATA:  Pulmonary embolism (PE) suspected, high prob; LLQ abdominal pain. EXAM: CT ANGIOGRAPHY CHEST CT ABDOMEN AND PELVIS WITH CONTRAST TECHNIQUE: Multidetector CT imaging of the chest was performed using the standard protocol during bolus administration of intravenous contrast. Multiplanar CT image reconstructions and MIPs were obtained to evaluate the vascular anatomy. Multidetector CT imaging of the abdomen and pelvis was performed using the standard protocol during bolus administration of intravenous contrast. RADIATION DOSE REDUCTION: This exam was performed according to the departmental dose-optimization program which includes automated exposure control, adjustment of the mA and/or kV according to patient size and/or use of iterative reconstruction technique. CONTRAST:  80mL OMNIPAQUE IOHEXOL 350 MG/ML SOLN COMPARISON:  Chest CT 08/03/2019. FINDINGS: CTA CHEST FINDINGS Cardiovascular: Satisfactory opacification of the pulmonary arteries to the segmental level. No evidence of pulmonary embolism. Normal heart size. No pericardial effusion. Aortic atherosclerosis. Coronary artery calcifications. Mediastinum/Nodes: Enlarged bilateral hilar and mediastinal lymph nodes, indeterminate. Trachea and esophagus are unremarkable. Lungs/Pleura: Moderate loculated bilateral pleural effusions with adjacent atelectasis throughout both lungs. Additional patchy airspace opacities (for example see image 53 and 64 series  6) may reflect focal atelectasis or be infectious, inflammatory or neoplastic in etiology. No pneumothorax. Musculoskeletal: No chest wall abnormality. No acute or significant osseous findings. Diffuse body wall edema. Review of the MIP images confirms the above findings. CT ABDOMEN and PELVIS FINDINGS Hepatobiliary: No focal liver abnormality. Cholelithiasis without evidence of acute cholecystitis. No biliary dilatation. Pancreas: Unremarkable. No pancreatic ductal dilatation or surrounding  inflammatory changes. Spleen: Normal. Adrenals/Urinary Tract: Adrenals are unremarkable. Bilateral renal cortical atrophy and simple cysts. No mass or hydronephrosis. Thick-walled bladder with mild surrounding fat stranding. Stomach/Bowel: Normal stomach and duodenum. No dilated loops of small bowel. Normal appendix is visualized on axial image 58 series 4. Colon is mostly decompressed. No bowel wall thickening or surrounding inflammation. Vascular/Lymphatic: Aortic atherosclerosis. No enlarged abdominal or pelvic lymph nodes. Reproductive: Prostate is unremarkable. Other: Diffuse body wall and presacral edema.  Trace pelvic ascites. Musculoskeletal: Sarcopenia.  No acute bony findings. Review of the MIP images confirms the above findings. IMPRESSION: CHEST CTA: 1. No evidence of pulmonary embolism. 2. Moderate loculated bilateral pleural effusions with adjacent atelectasis. 3. Additional patchy airspace opacities may reflect focal atelectasis or be infectious, inflammatory or neoplastic in etiology. 4. Enlarged bilateral hilar and mediastinal lymph nodes, indeterminate. CT ABDOMEN/PELVIS: 1. Thick-walled bladder with surrounding fat stranding concerning for cystitis. Correlate with urinalysis. 2. Diffuse body wall edema and trace pelvic ascites. 3. Cholelithiasis without evidence of acute cholecystitis. Aortic Atherosclerosis (ICD10-I70.0). Electronically Signed   By: Orvan Falconer M.D.   On: 12/19/2022 11:57   CT ABDOMEN PELVIS W CONTRAST  Result Date: 12/19/2022 CLINICAL DATA:  Pulmonary embolism (PE) suspected, high prob; LLQ abdominal pain. EXAM: CT ANGIOGRAPHY CHEST CT ABDOMEN AND PELVIS WITH CONTRAST TECHNIQUE: Multidetector CT imaging of the chest was performed using the standard protocol during bolus administration of intravenous contrast. Multiplanar CT image reconstructions and MIPs were obtained to evaluate the vascular anatomy. Multidetector CT imaging of the abdomen and pelvis was performed using  the standard protocol during bolus administration of intravenous contrast. RADIATION DOSE REDUCTION: This exam was performed according to the departmental dose-optimization program which includes automated exposure control, adjustment of the mA and/or kV according to patient size and/or use of iterative reconstruction technique. CONTRAST:  80mL OMNIPAQUE IOHEXOL 350 MG/ML SOLN COMPARISON:  Chest CT 08/03/2019. FINDINGS: CTA CHEST FINDINGS Cardiovascular: Satisfactory opacification of the pulmonary arteries to the segmental level. No evidence of pulmonary embolism. Normal heart size. No pericardial effusion. Aortic atherosclerosis. Coronary artery calcifications. Mediastinum/Nodes: Enlarged bilateral hilar and mediastinal lymph nodes, indeterminate. Trachea and esophagus are unremarkable. Lungs/Pleura: Moderate loculated bilateral pleural effusions with adjacent atelectasis throughout both lungs. Additional patchy airspace opacities (for example see image 53 and 64 series 6) may reflect focal atelectasis or be infectious, inflammatory or neoplastic in etiology. No pneumothorax. Musculoskeletal: No chest wall abnormality. No acute or significant osseous findings. Diffuse body wall edema. Review of the MIP images confirms the above findings. CT ABDOMEN and PELVIS FINDINGS Hepatobiliary: No focal liver abnormality. Cholelithiasis without evidence of acute cholecystitis. No biliary dilatation. Pancreas: Unremarkable. No pancreatic ductal dilatation or surrounding inflammatory changes. Spleen: Normal. Adrenals/Urinary Tract: Adrenals are unremarkable. Bilateral renal cortical atrophy and simple cysts. No mass or hydronephrosis. Thick-walled bladder with mild surrounding fat stranding. Stomach/Bowel: Normal stomach and duodenum. No dilated loops of small bowel. Normal appendix is visualized on axial image 58 series 4. Colon is mostly decompressed. No bowel wall thickening or surrounding inflammation. Vascular/Lymphatic:  Aortic atherosclerosis. No enlarged abdominal or pelvic lymph nodes. Reproductive: Prostate is  unremarkable. Other: Diffuse body wall and presacral edema.  Trace pelvic ascites. Musculoskeletal: Sarcopenia.  No acute bony findings. Review of the MIP images confirms the above findings. IMPRESSION: CHEST CTA: 1. No evidence of pulmonary embolism. 2. Moderate loculated bilateral pleural effusions with adjacent atelectasis. 3. Additional patchy airspace opacities may reflect focal atelectasis or be infectious, inflammatory or neoplastic in etiology. 4. Enlarged bilateral hilar and mediastinal lymph nodes, indeterminate. CT ABDOMEN/PELVIS: 1. Thick-walled bladder with surrounding fat stranding concerning for cystitis. Correlate with urinalysis. 2. Diffuse body wall edema and trace pelvic ascites. 3. Cholelithiasis without evidence of acute cholecystitis. Aortic Atherosclerosis (ICD10-I70.0). Electronically Signed   By: Orvan Falconer M.D.   On: 12/19/2022 11:57   MR BRAIN WO CONTRAST  Result Date: 12/19/2022 CLINICAL DATA:  Neuro deficit, acute, stroke suspected EXAM: MRI HEAD WITHOUT CONTRAST TECHNIQUE: Multiplanar, multiecho pulse sequences of the brain and surrounding structures were obtained without intravenous contrast. COMPARISON:  CT Head 07/20/22 FINDINGS: Brain: Negative for an acute infarct. No hemorrhage. No extra-axial fluid collection. No hydrocephalus. There is sequela of mild chronic microvascular ischemic change. Vascular: Normal flow voids. Skull and upper cervical spine: Normal marrow signal. Sinuses/Orbits: Trace bilateral mastoid effusions. No middle ear effusion. Paranasal sinuses are clear. Bilateral intra-ocular silicone injection and lens replacement. Orbits are otherwise unremarkable. Other: None. IMPRESSION: No acute intracranial process. Electronically Signed   By: Lorenza Cambridge M.D.   On: 12/19/2022 09:12   DG Chest 2 View  Result Date: 12/19/2022 CLINICAL DATA:  SOB EXAM: CHEST - 2  VIEW COMPARISON:  CXR 10/07/22 FINDINGS: Right-sided central venous catheter with tip in the right atrium. Small bilateral pleural effusions, unchanged to slightly improved from prior exam with interval resolution of the fissural fluid on the right. There is an apparent lucency along the paramediastinal aspect of the left upper lung, new from prior exam. Although this may be due to a resolving pleural effusion, pneumothorax is not excluded. Unchanged cardiac and mediastinal contours. Unchanged hazy opacity in the right lung base which could represent atelectasis or infection. No radiographically apparent displaced rib fractures. Visualized upper abdomen is unremarkable. Vertebral body heights are poorly assessed due to pulmonary interstitial opacities. IMPRESSION: 1. Small bilateral pleural effusions, unchanged to slightly improved from prior exam with interval resolution of the fissural fluid on the right. 2. Apparent lucency along the paramediastinal aspect of the left upper lung, new from prior exam. Although this may be due to a resolving pleural effusion, a pneumothorax is not excluded. Recommend short term follow up chest radiograph. 3. Unchanged hazy opacity in the right lung base which could represent atelectasis or infection. Electronically Signed   By: Lorenza Cambridge M.D.   On: 12/19/2022 07:45   CT Head Wo Contrast  Result Date: 12/19/2022 CLINICAL DATA:  Neuro deficit, acute, stroke suspected EXAM: CT HEAD WITHOUT CONTRAST TECHNIQUE: Contiguous axial images were obtained from the base of the skull through the vertex without intravenous contrast. RADIATION DOSE REDUCTION: This exam was performed according to the departmental dose-optimization program which includes automated exposure control, adjustment of the mA and/or kV according to patient size and/or use of iterative reconstruction technique. COMPARISON:  CT head 05/25/19 FINDINGS: Brain: No evidence of hemorrhage, hydrocephalus, extra-axial  collection or mass lesion/mass effect. There is sequela of mild overall chronic microvascular ischemic change. There may be a small age indeterminate infarct in the region of the cingulum on the right (series 2, image 23). Vascular: No hyperdense vessel or unexpected calcification. Skull: Normal. Negative for  fracture or focal lesion. Sinuses/Orbits: No middle ear or mastoid effusion. Paranasal sinuses are clear. Postprocedural changes from bilateral intra-ocular silicone oil injection. Bilateral lens replacement. Orbits are otherwise unremarkable. Other: None. IMPRESSION: 1. No acute intracranial hemorrhage. 2. Possible small age indeterminate infarct in the region of the cingulum on the right. If there is concern for acute infarct, consider further evaluation with MRI. Electronically Signed   By: Lorenza Cambridge M.D.   On: 12/19/2022 07:28    Medications:  sodium chloride     azithromycin 500 mg (12/20/22 0906)   cefTRIAXone (ROCEPHIN)  IV 2 g (12/20/22 1610)    Chlorhexidine Gluconate Cloth  6 each Topical Q0600   feeding supplement (NEPRO CARB STEADY)  237 mL Oral BID BM   heparin  5,000 Units Subcutaneous Q8H   insulin aspart  0-5 Units Subcutaneous QHS   insulin aspart  0-6 Units Subcutaneous TID WC   lanthanum  1,000 mg Oral TID WC    D[facility] Orders: [facility]  3h  400/1.5   76.5kg  3K/2.5 bath  TDC   Heparin 6500 - last OP HD 6/08 (4 HD last week), post wt 76.1kg - coming a bit under  last 2 weeks - venofer 50mg  IV weekly - mircera 120 mcg IV q 2 wks, last 5/29, due today 6/12    CXR - bilat early pulm IS edema     Assessment/ Plan: CP/ SOB - w/ CXR showing sig infiltrates c/w pulm edema and/or infiltrates. PNA also suspected.  IV ABX started.  Breathing improved with dialysis.   BP /volume - BP's elevated, suspect he may be losing body wt with decreased appetite. Significantly under edw if weights correct, continue to titrate down as tolerated.  Get standing weight post HD.   ESRD - on HD MWF. HD tomorrow per regular schedule.  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 Aranesp ordered for tomorrow.   MBD ckd - CCa in range, add on phos. Cont fosrenol as binder DM2 Legally blind H/o CVA Nutrition - renal diet w/fluid restrictions.  Alb 2.3 - add protein supplements.   Virgina Norfolk, PA-C [facility] 12/20/2022,12:10 PM  LOS: 1 day

## 2022-12-20 NOTE — TOC CM/SW Note (Signed)
Transition of Care [facility]) - Inpatient Brief Assessment   Patient Details  Name: Shawn [name] MRN: [id number] Date of Birth: Sep 10, 1960  Transition of Care [facility] CM/SW Contact:    Tom-Johnson, Hershal Coria, RN Phone Number: 12/20/2022, 1:43 PM   Clinical Narrative:  Patient is admitted with 2 months of progressive Weakness, SOB and Chest Pain. Found to have HCAP, on IV abx. Patient is Legally blind. Patient goes to outpatient dialysis on a MWF schedule. Uses [facility] to and from dialysis.   From home with wife, has three step children. Has a cane, walker at home. Needs minimal assist at home.  PCP is Corine Shelter, MD and uses [facility] mail order script and [facility] on [address].  No TOC needs or recommendations noted at this time. CM will continue to follow as patient progresses with care towards discharge.    Transition of Care Asessment: Insurance and Status: Insurance coverage has been reviewed Patient has primary care physician: Yes Home environment has been reviewed: Yes Prior level of function:: Minimal assist Prior/Current Home Services: No current home services Social Determinants of Health Reivew: SDOH reviewed no interventions necessary Readmission risk has been reviewed: Yes Transition of care needs: no transition of care needs at this time

## 2022-12-20 NOTE — Progress Notes (Signed)
Pt receives out-pt HD at [facility] on MWF. Will assist as needed.   Kerissa Coia Renal Navigator [phone] 

## 2022-12-20 NOTE — Progress Notes (Signed)
PROGRESS NOTE    Shawn [name]  UJW:[id number] DOB: 11/11/1960 DOA: 12/19/2022 PCP: Corine Shelter, MD  Outpatient Specialists:     Brief Narrative:  Patient is a 62 year old male past medical history significant for end-stage renal disease on hemodialysis on Monday, Wednesday and Friday.  According to the patient, he has been on hemodialysis since 2011.  Patient gets dialyzed via right chest TDC.  Other medical history includes hypertension, diabetes mellitus, hyperlipidemia, previous CVA, orthostatic hypotension.  Patient is legally blind.  Patient was admitted with generalized weakness and shortness of breath.  CT scan of the chest done revealed bilateral pneumonia and some loculated effusion.  No history of fever or chills.  Patient has had cough for about 2 months.  12/20/2022: Patient was dialyzed yesterday.  Patient feels improved.  No shortness of breath.  No fever or chills.  Coughing is improving.  Patient is on IV antibiotics for pneumonia.   Assessment & Plan:   Principal Problem:   HCAP (healthcare-associated pneumonia) Active Problems:   ESRD (end stage renal disease) on dialysis [facility])   Essential hypertension   Diabetes mellitus with complication (HCC)   Pure hypercholesterolemia   Generalized weakness:  -Possibly related to current pneumonia. -Patient is improving. -PT OT.    HCAP:  -Continue IV Rocephin and Zithromax.     End-stage renal disease:  -On hemodialysis Monday Wednesday and Fridays.   -For hemodialysis tomorrow.  Diabetes mellitus: -Continue sliding scale insulin coverage.    Essential hypertension:  -Continue to optimize.     Bilateral blindness:  Legally blind.  Continue supportive care.   Hyperlipidemia:  -Continue statin   DVT prophylaxis: Subcutaneous heparin. Code Status: Full code. Family Communication:  Disposition Plan: Likely discharge home in next 1 to 2 days.   Consultants:  Nephrology.  Procedures:   Hemodialysis.  Antimicrobials:  Review azithromycin. IV Rocephin.   Subjective: Patient seems to be improving. No shortness of breath. No fever or chills.  Objective: Vitals:   12/20/22 0015 12/20/22 0525 12/20/22 0734 12/20/22 0842  BP: (!) 171/77 (!) 176/83  (!) 157/70  Pulse: 97 95  98  Resp: 18 18  18   Temp: 98.3 F (36.8 C) 97.9 F (36.6 C)  97.8 F (36.6 C)  TempSrc: Oral     SpO2: 95% 95%  95%  Weight:   71.8 kg   Height:        Intake/Output Summary (Last 24 hours) at 12/20/2022 1533 Last data filed at 12/20/2022 0959 Gross per 24 hour  Intake 357 ml  Output 3500 ml  Net -3143 ml   Filed Weights   12/19/22 1138 12/19/22 2007 12/20/22 0734  Weight: 74.8 kg 71.5 kg 71.8 kg    Examination:  General exam: Appears calm and comfortable.  Patient is legally blind Respiratory system: Clear to auscultation.  Cardiovascular system: S1 & S2 heard Gastrointestinal system: Abdomen is soft and nontender.   Central nervous system: Alert and oriented.  Patient is blind.   Extremities: No leg edema.  Data Reviewed: I have personally reviewed following labs and imaging studies  CBC: Recent Labs  Lab 12/19/22 0638 12/19/22 0652 12/20/22 0900  WBC 9.7  --  8.4  NEUTROABS 8.2*  --   --   HGB 10.2* 11.9* 9.5*  HCT 34.2* 35.0* 30.8*  MCV 79.4*  --  79.0*  PLT 337  --  318   Basic Metabolic Panel: Recent Labs  Lab 12/19/22 0638 12/19/22 0652 12/20/22 0900  NA 131* 131* 132*  K 4.5 4.5 3.6  CL 92*  --  96*  CO2 22  --  24  GLUCOSE 241*  --  184*  BUN 17  --  10  CREATININE 6.56*  --  4.39*  CALCIUM 8.7*  --  8.2*  MG 2.6*  --   --    GFR: Estimated Creatinine Clearance: 17.9 mL/min (A) (by C-G formula based on SCr of 4.39 mg/dL (H)). Liver Function Tests: Recent Labs  Lab 12/19/22 0638 12/20/22 0900  AST 14* 12*  ALT 7 6  ALKPHOS 94 99  BILITOT 0.8 0.8  PROT 7.9 7.7  ALBUMIN 2.4* 2.3*   Recent Labs  Lab 12/19/22 0638  LIPASE 26   No  results for input(s): "AMMONIA" in the last 168 hours. Coagulation Profile: No results for input(s): "INR", "PROTIME" in the last 168 hours. Cardiac Enzymes: No results for input(s): "CKTOTAL", "CKMB", "CKMBINDEX", "TROPONINI" in the last 168 hours. BNP (last 3 results) No results for input(s): "PROBNP" in the last 8760 hours. HbA1C: No results for input(s): "HGBA1C" in the last 72 hours. CBG: Recent Labs  Lab 12/19/22 2042 12/20/22 0722 12/20/22 1145  GLUCAP 134* 171* 177*   Lipid Profile: No results for input(s): "CHOL", "HDL", "LDLCALC", "TRIG", "CHOLHDL", "LDLDIRECT" in the last 72 hours. Thyroid Function Tests: No results for input(s): "TSH", "T4TOTAL", "FREET4", "T3FREE", "THYROIDAB" in the last 72 hours. Anemia Panel: No results for input(s): "VITAMINB12", "FOLATE", "FERRITIN", "TIBC", "IRON", "RETICCTPCT" in the last 72 hours. Urine analysis:    Component Value Date/Time   COLORURINE YELLOW 01/25/2010 1953   APPEARANCEUR CLOUDY (A) 01/25/2010 1953   LABSPEC 1.017 01/25/2010 1953   PHURINE 5.5 01/25/2010 1953   GLUCOSEU 100 (A) 01/25/2010 1953   HGBUR SMALL (A) 01/25/2010 1953   BILIRUBINUR NEGATIVE 01/25/2010 1953   KETONESUR NEGATIVE 01/25/2010 1953   PROTEINUR >300 (A) 01/25/2010 1953   UROBILINOGEN 0.2 01/25/2010 1953   NITRITE NEGATIVE 01/25/2010 1953   LEUKOCYTESUR NEGATIVE 01/25/2010 1953   Sepsis Labs: @LABRCNTIP (procalcitonin:4,lacticidven:4)  ) Recent Results (from the past 240 hour(s))  SARS Coronavirus 2 by RT PCR (hospital order, performed in [facility] hospital lab) *cepheid single result test* Anterior Nasal Swab     Status: None   Collection Time: 12/19/22  6:58 AM   Specimen: Anterior Nasal Swab  Result Value Ref Range Status   SARS Coronavirus 2 by RT PCR NEGATIVE NEGATIVE Final    Comment: Performed at [facility], [address] [place], [place] [address]  MRSA Next Gen by PCR, Nasal     Status: None   Collection Time: 12/19/22   8:38 PM   Specimen: Nasal Mucosa; Nasal Swab  Result Value Ref Range Status   MRSA by PCR Next Gen NOT DETECTED NOT DETECTED Final    Comment: (NOTE) The GeneXpert MRSA Assay (FDA approved for NASAL specimens only), is one component of a comprehensive MRSA colonization surveillance program. It is not intended to diagnose MRSA infection nor to guide or monitor treatment for MRSA infections. Test performance is not FDA approved in patients less than 62 years old. Performed at [facility], [address] [place], [place] [address]          Radiology Studies: CT Angio Chest PE W and/or Wo Contrast  Result Date: 12/19/2022 CLINICAL DATA:  Pulmonary embolism (PE) suspected, high prob; LLQ abdominal pain. EXAM: CT ANGIOGRAPHY CHEST CT ABDOMEN AND PELVIS WITH CONTRAST TECHNIQUE: Multidetector CT imaging of the chest was performed using the  standard protocol during bolus administration of intravenous contrast. Multiplanar CT image reconstructions and MIPs were obtained to evaluate the vascular anatomy. Multidetector CT imaging of the abdomen and pelvis was performed using the standard protocol during bolus administration of intravenous contrast. RADIATION DOSE REDUCTION: This exam was performed according to the departmental dose-optimization program which includes automated exposure control, adjustment of the mA and/or kV according to patient size and/or use of iterative reconstruction technique. CONTRAST:  80mL OMNIPAQUE IOHEXOL 350 MG/ML SOLN COMPARISON:  Chest CT 08/03/2019. FINDINGS: CTA CHEST FINDINGS Cardiovascular: Satisfactory opacification of the pulmonary arteries to the segmental level. No evidence of pulmonary embolism. Normal heart size. No pericardial effusion. Aortic atherosclerosis. Coronary artery calcifications. Mediastinum/Nodes: Enlarged bilateral hilar and mediastinal lymph nodes, indeterminate. Trachea and esophagus are unremarkable. Lungs/Pleura: Moderate loculated  bilateral pleural effusions with adjacent atelectasis throughout both lungs. Additional patchy airspace opacities (for example see image 53 and 64 series 6) may reflect focal atelectasis or be infectious, inflammatory or neoplastic in etiology. No pneumothorax. Musculoskeletal: No chest wall abnormality. No acute or significant osseous findings. Diffuse body wall edema. Review of the MIP images confirms the above findings. CT ABDOMEN and PELVIS FINDINGS Hepatobiliary: No focal liver abnormality. Cholelithiasis without evidence of acute cholecystitis. No biliary dilatation. Pancreas: Unremarkable. No pancreatic ductal dilatation or surrounding inflammatory changes. Spleen: Normal. Adrenals/Urinary Tract: Adrenals are unremarkable. Bilateral renal cortical atrophy and simple cysts. No mass or hydronephrosis. Thick-walled bladder with mild surrounding fat stranding. Stomach/Bowel: Normal stomach and duodenum. No dilated loops of small bowel. Normal appendix is visualized on axial image 58 series 4. Colon is mostly decompressed. No bowel wall thickening or surrounding inflammation. Vascular/Lymphatic: Aortic atherosclerosis. No enlarged abdominal or pelvic lymph nodes. Reproductive: Prostate is unremarkable. Other: Diffuse body wall and presacral edema.  Trace pelvic ascites. Musculoskeletal: Sarcopenia.  No acute bony findings. Review of the MIP images confirms the above findings. IMPRESSION: CHEST CTA: 1. No evidence of pulmonary embolism. 2. Moderate loculated bilateral pleural effusions with adjacent atelectasis. 3. Additional patchy airspace opacities may reflect focal atelectasis or be infectious, inflammatory or neoplastic in etiology. 4. Enlarged bilateral hilar and mediastinal lymph nodes, indeterminate. CT ABDOMEN/PELVIS: 1. Thick-walled bladder with surrounding fat stranding concerning for cystitis. Correlate with urinalysis. 2. Diffuse body wall edema and trace pelvic ascites. 3. Cholelithiasis without  evidence of acute cholecystitis. Aortic Atherosclerosis (ICD10-I70.0). Electronically Signed   By: Orvan Falconer M.D.   On: 12/19/2022 11:57   CT ABDOMEN PELVIS W CONTRAST  Result Date: 12/19/2022 CLINICAL DATA:  Pulmonary embolism (PE) suspected, high prob; LLQ abdominal pain. EXAM: CT ANGIOGRAPHY CHEST CT ABDOMEN AND PELVIS WITH CONTRAST TECHNIQUE: Multidetector CT imaging of the chest was performed using the standard protocol during bolus administration of intravenous contrast. Multiplanar CT image reconstructions and MIPs were obtained to evaluate the vascular anatomy. Multidetector CT imaging of the abdomen and pelvis was performed using the standard protocol during bolus administration of intravenous contrast. RADIATION DOSE REDUCTION: This exam was performed according to the departmental dose-optimization program which includes automated exposure control, adjustment of the mA and/or kV according to patient size and/or use of iterative reconstruction technique. CONTRAST:  80mL OMNIPAQUE IOHEXOL 350 MG/ML SOLN COMPARISON:  Chest CT 08/03/2019. FINDINGS: CTA CHEST FINDINGS Cardiovascular: Satisfactory opacification of the pulmonary arteries to the segmental level. No evidence of pulmonary embolism. Normal heart size. No pericardial effusion. Aortic atherosclerosis. Coronary artery calcifications. Mediastinum/Nodes: Enlarged bilateral hilar and mediastinal lymph nodes, indeterminate. Trachea and esophagus are unremarkable. Lungs/Pleura: Moderate loculated bilateral pleural effusions  with adjacent atelectasis throughout both lungs. Additional patchy airspace opacities (for example see image 53 and 64 series 6) may reflect focal atelectasis or be infectious, inflammatory or neoplastic in etiology. No pneumothorax. Musculoskeletal: No chest wall abnormality. No acute or significant osseous findings. Diffuse body wall edema. Review of the MIP images confirms the above findings. CT ABDOMEN and PELVIS FINDINGS  Hepatobiliary: No focal liver abnormality. Cholelithiasis without evidence of acute cholecystitis. No biliary dilatation. Pancreas: Unremarkable. No pancreatic ductal dilatation or surrounding inflammatory changes. Spleen: Normal. Adrenals/Urinary Tract: Adrenals are unremarkable. Bilateral renal cortical atrophy and simple cysts. No mass or hydronephrosis. Thick-walled bladder with mild surrounding fat stranding. Stomach/Bowel: Normal stomach and duodenum. No dilated loops of small bowel. Normal appendix is visualized on axial image 58 series 4. Colon is mostly decompressed. No bowel wall thickening or surrounding inflammation. Vascular/Lymphatic: Aortic atherosclerosis. No enlarged abdominal or pelvic lymph nodes. Reproductive: Prostate is unremarkable. Other: Diffuse body wall and presacral edema.  Trace pelvic ascites. Musculoskeletal: Sarcopenia.  No acute bony findings. Review of the MIP images confirms the above findings. IMPRESSION: CHEST CTA: 1. No evidence of pulmonary embolism. 2. Moderate loculated bilateral pleural effusions with adjacent atelectasis. 3. Additional patchy airspace opacities may reflect focal atelectasis or be infectious, inflammatory or neoplastic in etiology. 4. Enlarged bilateral hilar and mediastinal lymph nodes, indeterminate. CT ABDOMEN/PELVIS: 1. Thick-walled bladder with surrounding fat stranding concerning for cystitis. Correlate with urinalysis. 2. Diffuse body wall edema and trace pelvic ascites. 3. Cholelithiasis without evidence of acute cholecystitis. Aortic Atherosclerosis (ICD10-I70.0). Electronically Signed   By: Orvan Falconer M.D.   On: 12/19/2022 11:57   MR BRAIN WO CONTRAST  Result Date: 12/19/2022 CLINICAL DATA:  Neuro deficit, acute, stroke suspected EXAM: MRI HEAD WITHOUT CONTRAST TECHNIQUE: Multiplanar, multiecho pulse sequences of the brain and surrounding structures were obtained without intravenous contrast. COMPARISON:  CT Head 07/20/22 FINDINGS: Brain:  Negative for an acute infarct. No hemorrhage. No extra-axial fluid collection. No hydrocephalus. There is sequela of mild chronic microvascular ischemic change. Vascular: Normal flow voids. Skull and upper cervical spine: Normal marrow signal. Sinuses/Orbits: Trace bilateral mastoid effusions. No middle ear effusion. Paranasal sinuses are clear. Bilateral intra-ocular silicone injection and lens replacement. Orbits are otherwise unremarkable. Other: None. IMPRESSION: No acute intracranial process. Electronically Signed   By: Lorenza Cambridge M.D.   On: 12/19/2022 09:12   DG Chest 2 View  Result Date: 12/19/2022 CLINICAL DATA:  SOB EXAM: CHEST - 2 VIEW COMPARISON:  CXR 10/07/22 FINDINGS: Right-sided central venous catheter with tip in the right atrium. Small bilateral pleural effusions, unchanged to slightly improved from prior exam with interval resolution of the fissural fluid on the right. There is an apparent lucency along the paramediastinal aspect of the left upper lung, new from prior exam. Although this may be due to a resolving pleural effusion, pneumothorax is not excluded. Unchanged cardiac and mediastinal contours. Unchanged hazy opacity in the right lung base which could represent atelectasis or infection. No radiographically apparent displaced rib fractures. Visualized upper abdomen is unremarkable. Vertebral body heights are poorly assessed due to pulmonary interstitial opacities. IMPRESSION: 1. Small bilateral pleural effusions, unchanged to slightly improved from prior exam with interval resolution of the fissural fluid on the right. 2. Apparent lucency along the paramediastinal aspect of the left upper lung, new from prior exam. Although this may be due to a resolving pleural effusion, a pneumothorax is not excluded. Recommend short term follow up chest radiograph. 3. Unchanged hazy opacity in the right lung  base which could represent atelectasis or infection. Electronically Signed   By: Lorenza Cambridge M.D.   On: 12/19/2022 07:45   CT Head Wo Contrast  Result Date: 12/19/2022 CLINICAL DATA:  Neuro deficit, acute, stroke suspected EXAM: CT HEAD WITHOUT CONTRAST TECHNIQUE: Contiguous axial images were obtained from the base of the skull through the vertex without intravenous contrast. RADIATION DOSE REDUCTION: This exam was performed according to the departmental dose-optimization program which includes automated exposure control, adjustment of the mA and/or kV according to patient size and/or use of iterative reconstruction technique. COMPARISON:  CT head 05/25/19 FINDINGS: Brain: No evidence of hemorrhage, hydrocephalus, extra-axial collection or mass lesion/mass effect. There is sequela of mild overall chronic microvascular ischemic change. There may be a small age indeterminate infarct in the region of the cingulum on the right (series 2, image 23). Vascular: No hyperdense vessel or unexpected calcification. Skull: Normal. Negative for fracture or focal lesion. Sinuses/Orbits: No middle ear or mastoid effusion. Paranasal sinuses are clear. Postprocedural changes from bilateral intra-ocular silicone oil injection. Bilateral lens replacement. Orbits are otherwise unremarkable. Other: None. IMPRESSION: 1. No acute intracranial hemorrhage. 2. Possible small age indeterminate infarct in the region of the cingulum on the right. If there is concern for acute infarct, consider further evaluation with MRI. Electronically Signed   By: Lorenza Cambridge M.D.   On: 12/19/2022 07:28        Scheduled Meds:  Chlorhexidine Gluconate Cloth  6 each Topical Q0600   [START ON 12/21/2022] Chlorhexidine Gluconate Cloth  6 each Topical Q0600   [START ON 12/21/2022] darbepoetin (ARANESP) injection - NON-DIALYSIS  100 mcg Subcutaneous Q Fri-1800   feeding supplement (NEPRO CARB STEADY)  237 mL Oral BID BM   heparin  5,000 Units Subcutaneous Q8H   insulin aspart  0-5 Units Subcutaneous QHS   insulin aspart  0-6 Units  Subcutaneous TID WC   lanthanum  1,000 mg Oral TID WC   Continuous Infusions:  sodium chloride     azithromycin 500 mg (12/20/22 0906)   cefTRIAXone (ROCEPHIN)  IV 2 g (12/20/22 0814)     LOS: 1 day    Time spent: 35 minutes.    Berton Mount, MD  [facility] Pager #: [phone] 7PM-7AM contact night coverage as above

## 2022-12-21 DIAGNOSIS — J189 Pneumonia, unspecified organism: Secondary | ICD-10-CM | POA: Diagnosis not present

## 2022-12-21 LAB — RENAL FUNCTION PANEL
Albumin: 1.9 g/dL — ABNORMAL LOW (ref 3.5–5.0)
Anion gap: 11 (ref 5–15)
BUN: 16 mg/dL (ref 8–23)
CO2: 27 mmol/L (ref 22–32)
Calcium: 8.3 mg/dL — ABNORMAL LOW (ref 8.9–10.3)
Chloride: 95 mmol/L — ABNORMAL LOW (ref 98–111)
Creatinine, Ser: 5.42 mg/dL — ABNORMAL HIGH (ref 0.61–1.24)
GFR, Estimated: 11 mL/min — ABNORMAL LOW (ref >60)
Glucose, Bld: 103 mg/dL — ABNORMAL HIGH (ref 70–99)
Phosphorus: 1.2 mg/dL — ABNORMAL LOW (ref 2.5–4.6)
Potassium: 4.2 mmol/L (ref 3.5–5.1)
Sodium: 133 mmol/L — ABNORMAL LOW (ref 135–145)

## 2022-12-21 LAB — CBC
HCT: 27.1 % — ABNORMAL LOW (ref 39.0–52.0)
Hemoglobin: 8.3 g/dL — ABNORMAL LOW (ref 13.0–17.0)
MCH: 23.9 pg — ABNORMAL LOW (ref 26.0–34.0)
MCHC: 30.6 g/dL (ref 30.0–36.0)
MCV: 77.9 fL — ABNORMAL LOW (ref 80.0–100.0)
Platelets: 366 10*3/uL (ref 150–400)
RBC: 3.48 MIL/uL — ABNORMAL LOW (ref 4.22–5.81)
RDW: 18.7 % — ABNORMAL HIGH (ref 11.5–15.5)
WBC: 10.6 10*3/uL — ABNORMAL HIGH (ref 4.0–10.5)
nRBC: 0 % (ref 0.0–0.2)

## 2022-12-21 LAB — GLUCOSE, CAPILLARY
Glucose-Capillary: 88 mg/dL (ref 70–99)
Glucose-Capillary: 172 mg/dL — ABNORMAL HIGH (ref 70–99)
Glucose-Capillary: 229 mg/dL — ABNORMAL HIGH (ref 70–99)

## 2022-12-21 MED ORDER — HEPARIN SODIUM (PORCINE) 1000 UNIT/ML DIALYSIS
6500.0000 [IU] | INTRAMUSCULAR | Status: DC | PRN
  Administered 2022-12-21: 6500 [IU] via INTRAVENOUS_CENTRAL
  Filled 2022-12-21 (×3): qty 7

## 2022-12-21 MED ORDER — LIDOCAINE-PRILOCAINE 2.5-2.5 % EX CREA: 1.0000 | TOPICAL_CREAM | CUTANEOUS | Status: DC | PRN

## 2022-12-21 MED ORDER — ANTICOAGULANT SODIUM CITRATE 4% (200MG/5ML) IV SOLN: 5.0000 mL | Status: DC | PRN

## 2022-12-21 MED ORDER — PENTAFLUOROPROP-TETRAFLUOROETH EX AERO: 1.0000 | INHALATION_SPRAY | CUTANEOUS | Status: DC | PRN

## 2022-12-21 MED ORDER — LIDOCAINE HCL (PF) 1 % IJ SOLN: 5.0000 mL | INTRAMUSCULAR | Status: DC | PRN

## 2022-12-21 MED ORDER — HEPARIN SODIUM (PORCINE) 1000 UNIT/ML DIALYSIS: 1000.0000 [IU] | INTRAMUSCULAR | Status: DC | PRN

## 2022-12-21 MED ORDER — ALTEPLASE 2 MG IJ SOLR: 2.0000 mg | Freq: Once | INTRAMUSCULAR | Status: DC | PRN

## 2022-12-21 NOTE — Progress Notes (Signed)
Received patient in bed to unit.  Alert and oriented.  Informed consent signed and in chart.   TX duration: 4hrs  Patient tolerated well.  Alert, without acute distress.  Hand-off given to patient's nurse.   Access used: R IJ Access issues: None  Total UF removed: Medication(s) given: Heparin 6500units bolus  Post HD weight: 70.9kg   12/21/22 1140  Vitals  Temp 98 F (36.7 C)  Temp Source Oral  BP (!) 143/76  MAP (mmHg) 96  BP Location Right Arm  BP Method Automatic  Patient Position (if appropriate) Lying  Pulse Rate 98  Pulse Rate Source Monitor  ECG Heart Rate 98  Resp (!) 24  Oxygen Therapy  SpO2 98 %  O2 Device Room Air  During Treatment Monitoring  Intra-Hemodialysis Comments Tx completed;Tolerated well  Post Treatment  Dialyzer Clearance Lightly streaked  Duration of HD Treatment -hour(s) 4 hour(s)  Liters Processed 95  Fluid Removed (mL) 4000 mL  Tolerated HD Treatment Yes  Hemodialysis Catheter Right Subclavian  No Placement Date or Time found.   Placed prior to admission: Yes  Orientation: Right  Access Location: Subclavian  Site Condition No complications  Blue Lumen Status Dead end cap in place;Blood return noted;Heparin locked  Red Lumen Status Dead end cap in place;Heparin locked  Purple Lumen Status N/A  Catheter fill solution Heparin 1000 units/ml  Catheter fill volume (Arterial) 1.9 cc  Catheter fill volume (Venous) 1.9  Dressing Type Transparent  Dressing Status Antimicrobial disc in place;Clean, Dry, Intact  Interventions Other (Comment)  Drainage Description None  Dressing Change Due 12/26/22  Post treatment catheter status Capped and Clamped     [facility]

## 2022-12-21 NOTE — Progress Notes (Signed)
[facility] Progress Note   Subjective:  Seen on HD, 3L UFG and tolerating. Reports breathing is a little better, although had major cough spell this AM. No CP or abdominal pain, no N/V/D at this time.  Objective Vitals:   12/21/22 0728 12/21/22 0733 12/21/22 0758 12/21/22 0830  BP: (!) 161/73 (!) 148/69 (!) 169/78 (!) 156/78  Pulse: 95 96 99 93  Resp: (!) 26 19 (!) 28 (!) 22  Temp: 98.5 F (36.9 C)     TempSrc: Oral     SpO2: 100% 99% 100% 100%  Weight:      Height:       Physical Exam General: Chronically ill appearing man, NAD. Nasal O2 in place. Heart: RRR; no murmur Lungs: CTA anterolaterally Abdomen: soft Extremities: no LE edema Dialysis Access:  [facility]  Additional Objective Labs: Basic Metabolic Panel: Recent Labs  Lab 12/19/22 0638 12/19/22 0652 12/20/22 0900 12/21/22 0221  NA 131* 131* 132* 133*  K 4.5 4.5 3.6 4.2  CL 92*  --  96* 95*  CO2 22  --  24 27  GLUCOSE 241*  --  184* 103*  BUN 17  --  10 16  CREATININE 6.56*  --  4.39* 5.42*  CALCIUM 8.7*  --  8.2* 8.3*  PHOS  --   --   --  1.2*   Liver Function Tests: Recent Labs  Lab 12/19/22 0638 12/20/22 0900 12/21/22 0221  AST 14* 12*  --   ALT 7 6  --   ALKPHOS 94 99  --   BILITOT 0.8 0.8  --   PROT 7.9 7.7  --   ALBUMIN 2.4* 2.3* 1.9*   Recent Labs  Lab 12/19/22 0638  LIPASE 26   CBC: Recent Labs  Lab 12/19/22 0638 12/19/22 0652 12/20/22 0900 12/21/22 0221  WBC 9.7  --  8.4 10.6*  NEUTROABS 8.2*  --   --   --   HGB 10.2* 11.9* 9.5* 8.3*  HCT 34.2* 35.0* 30.8* 27.1*  MCV 79.4*  --  79.0* 77.9*  PLT 337  --  318 366   Studies/Results: CT Angio Chest PE W and/or Wo Contrast  Result Date: 12/19/2022 CLINICAL DATA:  Pulmonary embolism (PE) suspected, high prob; LLQ abdominal pain. EXAM: CT ANGIOGRAPHY CHEST CT ABDOMEN AND PELVIS WITH CONTRAST TECHNIQUE: Multidetector CT imaging of the chest was performed using the standard protocol during bolus administration of  intravenous contrast. Multiplanar CT image reconstructions and MIPs were obtained to evaluate the vascular anatomy. Multidetector CT imaging of the abdomen and pelvis was performed using the standard protocol during bolus administration of intravenous contrast. RADIATION DOSE REDUCTION: This exam was performed according to the departmental dose-optimization program which includes automated exposure control, adjustment of the mA and/or kV according to patient size and/or use of iterative reconstruction technique. CONTRAST:  80mL OMNIPAQUE IOHEXOL 350 MG/ML SOLN COMPARISON:  Chest CT 08/03/2019. FINDINGS: CTA CHEST FINDINGS Cardiovascular: Satisfactory opacification of the pulmonary arteries to the segmental level. No evidence of pulmonary embolism. Normal heart size. No pericardial effusion. Aortic atherosclerosis. Coronary artery calcifications. Mediastinum/Nodes: Enlarged bilateral hilar and mediastinal lymph nodes, indeterminate. Trachea and esophagus are unremarkable. Lungs/Pleura: Moderate loculated bilateral pleural effusions with adjacent atelectasis throughout both lungs. Additional patchy airspace opacities (for example see image 53 and 64 series 6) may reflect focal atelectasis or be infectious, inflammatory or neoplastic in etiology. No pneumothorax. Musculoskeletal: No chest wall abnormality. No acute or significant osseous findings. Diffuse body wall edema. Review of the  MIP images confirms the above findings. CT ABDOMEN and PELVIS FINDINGS Hepatobiliary: No focal liver abnormality. Cholelithiasis without evidence of acute cholecystitis. No biliary dilatation. Pancreas: Unremarkable. No pancreatic ductal dilatation or surrounding inflammatory changes. Spleen: Normal. Adrenals/Urinary Tract: Adrenals are unremarkable. Bilateral renal cortical atrophy and simple cysts. No mass or hydronephrosis. Thick-walled bladder with mild surrounding fat stranding. Stomach/Bowel: Normal stomach and duodenum. No dilated  loops of small bowel. Normal appendix is visualized on axial image 58 series 4. Colon is mostly decompressed. No bowel wall thickening or surrounding inflammation. Vascular/Lymphatic: Aortic atherosclerosis. No enlarged abdominal or pelvic lymph nodes. Reproductive: Prostate is unremarkable. Other: Diffuse body wall and presacral edema.  Trace pelvic ascites. Musculoskeletal: Sarcopenia.  No acute bony findings. Review of the MIP images confirms the above findings. IMPRESSION: CHEST CTA: 1. No evidence of pulmonary embolism. 2. Moderate loculated bilateral pleural effusions with adjacent atelectasis. 3. Additional patchy airspace opacities may reflect focal atelectasis or be infectious, inflammatory or neoplastic in etiology. 4. Enlarged bilateral hilar and mediastinal lymph nodes, indeterminate. CT ABDOMEN/PELVIS: 1. Thick-walled bladder with surrounding fat stranding concerning for cystitis. Correlate with urinalysis. 2. Diffuse body wall edema and trace pelvic ascites. 3. Cholelithiasis without evidence of acute cholecystitis. Aortic Atherosclerosis (ICD10-I70.0). Electronically Signed   By: Orvan Falconer M.D.   On: 12/19/2022 11:57   CT ABDOMEN PELVIS W CONTRAST  Result Date: 12/19/2022 CLINICAL DATA:  Pulmonary embolism (PE) suspected, high prob; LLQ abdominal pain. EXAM: CT ANGIOGRAPHY CHEST CT ABDOMEN AND PELVIS WITH CONTRAST TECHNIQUE: Multidetector CT imaging of the chest was performed using the standard protocol during bolus administration of intravenous contrast. Multiplanar CT image reconstructions and MIPs were obtained to evaluate the vascular anatomy. Multidetector CT imaging of the abdomen and pelvis was performed using the standard protocol during bolus administration of intravenous contrast. RADIATION DOSE REDUCTION: This exam was performed according to the departmental dose-optimization program which includes automated exposure control, adjustment of the mA and/or kV according to patient  size and/or use of iterative reconstruction technique. CONTRAST:  80mL OMNIPAQUE IOHEXOL 350 MG/ML SOLN COMPARISON:  Chest CT 08/03/2019. FINDINGS: CTA CHEST FINDINGS Cardiovascular: Satisfactory opacification of the pulmonary arteries to the segmental level. No evidence of pulmonary embolism. Normal heart size. No pericardial effusion. Aortic atherosclerosis. Coronary artery calcifications. Mediastinum/Nodes: Enlarged bilateral hilar and mediastinal lymph nodes, indeterminate. Trachea and esophagus are unremarkable. Lungs/Pleura: Moderate loculated bilateral pleural effusions with adjacent atelectasis throughout both lungs. Additional patchy airspace opacities (for example see image 53 and 64 series 6) may reflect focal atelectasis or be infectious, inflammatory or neoplastic in etiology. No pneumothorax. Musculoskeletal: No chest wall abnormality. No acute or significant osseous findings. Diffuse body wall edema. Review of the MIP images confirms the above findings. CT ABDOMEN and PELVIS FINDINGS Hepatobiliary: No focal liver abnormality. Cholelithiasis without evidence of acute cholecystitis. No biliary dilatation. Pancreas: Unremarkable. No pancreatic ductal dilatation or surrounding inflammatory changes. Spleen: Normal. Adrenals/Urinary Tract: Adrenals are unremarkable. Bilateral renal cortical atrophy and simple cysts. No mass or hydronephrosis. Thick-walled bladder with mild surrounding fat stranding. Stomach/Bowel: Normal stomach and duodenum. No dilated loops of small bowel. Normal appendix is visualized on axial image 58 series 4. Colon is mostly decompressed. No bowel wall thickening or surrounding inflammation. Vascular/Lymphatic: Aortic atherosclerosis. No enlarged abdominal or pelvic lymph nodes. Reproductive: Prostate is unremarkable. Other: Diffuse body wall and presacral edema.  Trace pelvic ascites. Musculoskeletal: Sarcopenia.  No acute bony findings. Review of the MIP images confirms the above  findings. IMPRESSION: CHEST CTA: 1. No  evidence of pulmonary embolism. 2. Moderate loculated bilateral pleural effusions with adjacent atelectasis. 3. Additional patchy airspace opacities may reflect focal atelectasis or be infectious, inflammatory or neoplastic in etiology. 4. Enlarged bilateral hilar and mediastinal lymph nodes, indeterminate. CT ABDOMEN/PELVIS: 1. Thick-walled bladder with surrounding fat stranding concerning for cystitis. Correlate with urinalysis. 2. Diffuse body wall edema and trace pelvic ascites. 3. Cholelithiasis without evidence of acute cholecystitis. Aortic Atherosclerosis (ICD10-I70.0). Electronically Signed   By: Orvan Falconer M.D.   On: 12/19/2022 11:57   MR BRAIN WO CONTRAST  Result Date: 12/19/2022 CLINICAL DATA:  Neuro deficit, acute, stroke suspected EXAM: MRI HEAD WITHOUT CONTRAST TECHNIQUE: Multiplanar, multiecho pulse sequences of the brain and surrounding structures were obtained without intravenous contrast. COMPARISON:  CT Head 07/20/22 FINDINGS: Brain: Negative for an acute infarct. No hemorrhage. No extra-axial fluid collection. No hydrocephalus. There is sequela of mild chronic microvascular ischemic change. Vascular: Normal flow voids. Skull and upper cervical spine: Normal marrow signal. Sinuses/Orbits: Trace bilateral mastoid effusions. No middle ear effusion. Paranasal sinuses are clear. Bilateral intra-ocular silicone injection and lens replacement. Orbits are otherwise unremarkable. Other: None. IMPRESSION: No acute intracranial process. Electronically Signed   By: Lorenza Cambridge M.D.   On: 12/19/2022 09:12   Medications:  sodium chloride     anticoagulant sodium citrate     azithromycin Stopped (12/20/22 1015)   cefTRIAXone (ROCEPHIN)  IV Stopped (12/20/22 0915)    brimonidine  1 drop Both Eyes BID   Chlorhexidine Gluconate Cloth  6 each Topical Q0600   Chlorhexidine Gluconate Cloth  6 each Topical Q0600   darbepoetin (ARANESP) injection -  NON-DIALYSIS  100 mcg Subcutaneous Q Fri-1800   dorzolamide-timolol  1 drop Both Eyes BID   erythromycin   Both Eyes QHS   feeding supplement (NEPRO CARB STEADY)  237 mL Oral BID BM   heparin  5,000 Units Subcutaneous Q8H   insulin aspart  0-5 Units Subcutaneous QHS   insulin aspart  0-6 Units Subcutaneous TID WC   lanthanum  1,000 mg Oral TID WC   latanoprost  1 drop Both Eyes QHS    Dialysis Orders: [facility] M[facility]  3h  400/1.5   76.5kg  3K/2.5 bath  TDC   Heparin 6500 - last OP HD 6/08 (4 HD last week), post wt 76.1kg - coming a bit under  last 2 weeks - venofer 50mg  IV weekly - mircera 120 mcg IV q 2 wks, last 5/29, due today 6/12 CXR - bilat early pulm IS edema     Assessment/ Plan: Dyspnea + CP: CXR with B infiltrates - pulm edema v. HCAP. Started on Ceftriaxone + azithromycin. S/p HD on admit, weight is now below prior EDW -> continue to challenge down today. BP/volume: Will continue to challenge and lower EDW, BP still high.  ESRD: Continue HD on usual MWF schedule - HD today, 3L UFG. Anemia of ESRD: Hgb 8.3, Aranesp ordered for today 6/14.   Secondary hyperparathyroidism: CorrCa ok, Phos low - hold Fosrenol for now. T2DM Legally blind Hx CVA Nutrition: Alb very low, continue protein supplements + renal diet.     Ozzie Hoyle, PA-C 12/21/2022, 9:00 AM  [employer]

## 2022-12-21 NOTE — Progress Notes (Signed)
PROGRESS NOTE    Shawn [name]  ZOX:[id number] DOB: Dec 18, 1960 DOA: 12/19/2022 PCP: Corine Shelter, MD  Outpatient Specialists:     Brief Narrative:  Patient is a 62 year old male past medical history significant for end-stage renal disease on hemodialysis on Monday, Wednesday and Friday.  According to the patient, he has been on hemodialysis since 2011.  Patient gets dialyzed via right chest TDC.  Other medical history includes hypertension, diabetes mellitus, hyperlipidemia, previous CVA, orthostatic hypotension.  Patient is legally blind.  Patient was admitted with generalized weakness and shortness of breath.  CT scan of the chest done revealed bilateral pneumonia and some loculated effusion.  No history of fever or chills.  Patient has had cough for about 2 months.  12/20/2022: Patient was dialyzed yesterday.  Patient feels improved.  No shortness of breath.  No fever or chills.  Coughing is improving.  Patient is on IV antibiotics for pneumonia. 12/21/2022: Patient seen.  Patient underwent hemodialysis today with 4 L ultrafiltration.  Repeat chest x-ray in the morning.  Likely discharge home tomorrow on oral antibiotics.   Assessment & Plan:   Principal Problem:   HCAP (healthcare-associated pneumonia) Active Problems:   ESRD (end stage renal disease) on dialysis [facility])   Essential hypertension   Diabetes mellitus with complication (HCC)   Pure hypercholesterolemia   Generalized weakness:  -Possibly related to current pneumonia. -Patient is improving. -PT OT.  12/21/2022: Improving.  Likely discharge home tomorrow on oral antibiotics.   HCAP:  -Continue IV Rocephin and Zithromax.     End-stage renal disease:  -On hemodialysis Monday Wednesday and Fridays.   -Patient underwent hemodialysis today with 4 L ultrafiltration.    Diabetes mellitus: -Continue sliding scale insulin coverage.    Essential hypertension:  -Continue to optimize.     Bilateral blindness:   Legally blind.  Continue supportive care.   Hyperlipidemia:  -Continue statin   DVT prophylaxis: Subcutaneous heparin. Code Status: Full code. Family Communication:  Disposition Plan: Likely discharge home in next 1 to 2 days.   Consultants:  Nephrology.  Procedures:  Hemodialysis.  Antimicrobials:  Review azithromycin. IV Rocephin.   Subjective: No shortness of breath. No fever or chills.  Objective: Vitals:   12/21/22 1140 12/21/22 1144 12/21/22 1217 12/21/22 1619  BP: (!) 143/76 (!) 152/74 (!) 149/69 (!) 132/59  Pulse: 98 97 96 81  Resp: (!) 24 (!) 23 18 18   Temp: 98 F (36.7 C)  98.2 F (36.8 C) 98.3 F (36.8 C)  TempSrc: Oral     SpO2: 98% 99% 98% 96%  Weight:      Height:        Intake/Output Summary (Last 24 hours) at 12/21/2022 1807 Last data filed at 12/21/2022 1551 Gross per 24 hour  Intake 707.04 ml  Output 4000 ml  Net -3292.96 ml    Filed Weights   12/20/22 1900 12/21/22 0725 12/21/22 1139  Weight: 71.8 kg 74.5 kg 70.9 kg    Examination:  General exam: Appears calm and comfortable.  Patient is legally blind Respiratory system: Clear to auscultation.  Cardiovascular system: S1 & S2 heard Gastrointestinal system: Abdomen is soft and nontender.   Central nervous system: Alert and oriented.  Patient is blind.   Extremities: No leg edema.  Data Reviewed: I have personally reviewed following labs and imaging studies  CBC: Recent Labs  Lab 12/19/22 0638 12/19/22 0652 12/20/22 0900 12/21/22 0221  WBC 9.7  --  8.4 10.6*  NEUTROABS 8.2*  --   --   --  HGB 10.2* 11.9* 9.5* 8.3*  HCT 34.2* 35.0* 30.8* 27.1*  MCV 79.4*  --  79.0* 77.9*  PLT 337  --  318 366    Basic Metabolic Panel: Recent Labs  Lab 12/19/22 0638 12/19/22 0652 12/20/22 0900 12/21/22 0221  NA 131* 131* 132* 133*  K 4.5 4.5 3.6 4.2  CL 92*  --  96* 95*  CO2 22  --  24 27  GLUCOSE 241*  --  184* 103*  BUN 17  --  10 16  CREATININE 6.56*  --  4.39* 5.42*   CALCIUM 8.7*  --  8.2* 8.3*  MG 2.6*  --   --   --   PHOS  --   --   --  1.2*    GFR: Estimated Creatinine Clearance: 14.4 mL/min (A) (by C-G formula based on SCr of 5.42 mg/dL (H)). Liver Function Tests: Recent Labs  Lab 12/19/22 0638 12/20/22 0900 12/21/22 0221  AST 14* 12*  --   ALT 7 6  --   ALKPHOS 94 99  --   BILITOT 0.8 0.8  --   PROT 7.9 7.7  --   ALBUMIN 2.4* 2.3* 1.9*    Recent Labs  Lab 12/19/22 0638  LIPASE 26    No results for input(s): "AMMONIA" in the last 168 hours. Coagulation Profile: No results for input(s): "INR", "PROTIME" in the last 168 hours. Cardiac Enzymes: No results for input(s): "CKTOTAL", "CKMB", "CKMBINDEX", "TROPONINI" in the last 168 hours. BNP (last 3 results) No results for input(s): "PROBNP" in the last 8760 hours. HbA1C: No results for input(s): "HGBA1C" in the last 72 hours. CBG: Recent Labs  Lab 12/20/22 1145 12/20/22 1641 12/20/22 2047 12/21/22 1216 12/21/22 1650  GLUCAP 177* 309* 207* 88 172*    Lipid Profile: No results for input(s): "CHOL", "HDL", "LDLCALC", "TRIG", "CHOLHDL", "LDLDIRECT" in the last 72 hours. Thyroid Function Tests: No results for input(s): "TSH", "T4TOTAL", "FREET4", "T3FREE", "THYROIDAB" in the last 72 hours. Anemia Panel: No results for input(s): "VITAMINB12", "FOLATE", "FERRITIN", "TIBC", "IRON", "RETICCTPCT" in the last 72 hours. Urine analysis:    Component Value Date/Time   COLORURINE YELLOW 01/25/2010 1953   APPEARANCEUR CLOUDY (A) 01/25/2010 1953   LABSPEC 1.017 01/25/2010 1953   PHURINE 5.5 01/25/2010 1953   GLUCOSEU 100 (A) 01/25/2010 1953   HGBUR SMALL (A) 01/25/2010 1953   BILIRUBINUR NEGATIVE 01/25/2010 1953   KETONESUR NEGATIVE 01/25/2010 1953   PROTEINUR >300 (A) 01/25/2010 1953   UROBILINOGEN 0.2 01/25/2010 1953   NITRITE NEGATIVE 01/25/2010 1953   LEUKOCYTESUR NEGATIVE 01/25/2010 1953   Sepsis Labs: @LABRCNTIP (procalcitonin:4,lacticidven:4)  ) Recent Results (from  the past 240 hour(s))  SARS Coronavirus 2 by RT PCR (hospital order, performed in [facility] hospital lab) *cepheid single result test* Anterior Nasal Swab     Status: None   Collection Time: 12/19/22  6:58 AM   Specimen: Anterior Nasal Swab  Result Value Ref Range Status   SARS Coronavirus 2 by RT PCR NEGATIVE NEGATIVE Final    Comment: Performed at [facility], [address] [place], [place] [address]  MRSA Next Gen by PCR, Nasal     Status: None   Collection Time: 12/19/22  8:38 PM   Specimen: Nasal Mucosa; Nasal Swab  Result Value Ref Range Status   MRSA by PCR Next Gen NOT DETECTED NOT DETECTED Final    Comment: (NOTE) The GeneXpert MRSA Assay (FDA approved for NASAL specimens only), is one component of a comprehensive MRSA  colonization surveillance program. It is not intended to diagnose MRSA infection nor to guide or monitor treatment for MRSA infections. Test performance is not FDA approved in patients less than 88 years old. Performed at [facility], [address] [place], [place] [address]          Radiology Studies: No results found.      Scheduled Meds:  brimonidine  1 drop Both Eyes BID   Chlorhexidine Gluconate Cloth  6 each Topical Q0600   Chlorhexidine Gluconate Cloth  6 each Topical Q0600   darbepoetin (ARANESP) injection - NON-DIALYSIS  100 mcg Subcutaneous Q Fri-1800   dorzolamide-timolol  1 drop Both Eyes BID   erythromycin   Both Eyes QHS   feeding supplement (NEPRO CARB STEADY)  237 mL Oral BID BM   heparin  5,000 Units Subcutaneous Q8H   insulin aspart  0-5 Units Subcutaneous QHS   insulin aspart  0-6 Units Subcutaneous TID WC   latanoprost  1 drop Both Eyes QHS   Continuous Infusions:  sodium chloride     azithromycin 500 mg (12/21/22 1301)   cefTRIAXone (ROCEPHIN)  IV 2 g (12/21/22 1311)     LOS: 2 days    Time spent: 35 minutes.    Berton Mount, MD  [facility] Pager #: [phone] 7PM-7AM contact  night coverage as above

## 2022-12-22 ENCOUNTER — Inpatient Hospital Stay (HOSPITAL_COMMUNITY): Payer: Medicare Other

## 2022-12-22 DIAGNOSIS — J189 Pneumonia, unspecified organism: Secondary | ICD-10-CM | POA: Diagnosis not present

## 2022-12-22 LAB — GLUCOSE, CAPILLARY
Glucose-Capillary: 180 mg/dL — ABNORMAL HIGH (ref 70–99)
Glucose-Capillary: 255 mg/dL — ABNORMAL HIGH (ref 70–99)

## 2022-12-22 MED ORDER — NEPRO/CARBSTEADY PO LIQD: 237.0000 mL | Freq: Two times a day (BID) | ORAL | 0 refills | Status: AC

## 2022-12-22 MED ORDER — DARBEPOETIN ALFA 100 MCG/0.5ML IJ SOSY: 100.0000 ug | PREFILLED_SYRINGE | INTRAMUSCULAR | 0 refills | Status: AC

## 2022-12-22 MED ORDER — CEFDINIR 300 MG PO CAPS: 300.0000 mg | ORAL_CAPSULE | Freq: Two times a day (BID) | ORAL | 0 refills | Status: AC

## 2022-12-22 NOTE — Evaluation (Signed)
Occupational Therapy Evaluation Patient Details Name: Shawn [name] MRN: [id number] DOB: [birth year] Today's Date: 12/22/2022   History of Present Illness 62 y.o. male admitted on 12/19/22 due to increased weakness and SoB. Pt found to have Pneumonia. PMHx: CVA, DM, HTN, ESRD, blind. Pt on CRRT/HD.   Clinical Impression   Pt admitted for concerns listed above. PTA pt reported that he was independent with all BADL's and mobility, wife assisting with all IADL's. He uses a cane at baseline and is legally blind, however at this time, benefits from a RW due to balance concerns and fatigue. Overall requiring min guard to min assist for safety. OT recommending HH services to assist pt with returning to baseline independence. Acute OT will continue to follow acutely.       Recommendations for follow up therapy are one component of a multi-disciplinary discharge planning process, led by the attending physician.  Recommendations may be updated based on patient status, additional functional criteria and insurance authorization.   Assistance Recommended at Discharge Intermittent Supervision/Assistance  Patient can return home with the following A little help with walking and/or transfers;A little help with bathing/dressing/bathroom;Assistance with cooking/housework;Direct supervision/assist for medications management;Direct supervision/assist for financial management;Help with stairs or ramp for entrance;Assist for transportation    Functional Status Assessment  Patient has had a recent decline in their functional status and demonstrates the ability to make significant improvements in function in a reasonable and predictable amount of time.  Equipment Recommendations  None recommended by OT    Recommendations for Other Services       Precautions / Restrictions Precautions Precautions: Fall Precaution Comments: Pt legally blind Restrictions Weight Bearing Restrictions: No      Mobility Bed  Mobility Overal bed mobility: Modified Independent                  Transfers Overall transfer level: Needs assistance Equipment used: Rolling walker (2 wheels) Transfers: Sit to/from Stand Sit to Stand: Min guard, From elevated surface           General transfer comment: Required elevated bed to weakness, reports this is close to his baseline      Balance Overall balance assessment: Needs assistance Sitting-balance support: Feet supported, No upper extremity supported Sitting balance-Leahy Scale: Good     Standing balance support: Bilateral upper extremity supported Standing balance-Leahy Scale: Poor Standing balance comment: requiring RW for safest balance                           ADL either performed or assessed with clinical judgement   ADL Overall ADL's : Needs assistance/impaired Eating/Feeding: Set up;Sitting   Grooming: Set up;Sitting   Upper Body Bathing: Set up;Sitting   Lower Body Bathing: Minimal assistance;Sitting/lateral leans;Sit to/from stand   Upper Body Dressing : Set up;Sitting   Lower Body Dressing: Minimal assistance;Sitting/lateral leans;Sit to/from stand   Toilet Transfer: Min guard;Ambulation   Toileting- Clothing Manipulation and Hygiene: Min guard;Sitting/lateral lean;Sit to/from stand       Functional mobility during ADLs: Min guard;Rolling walker (2 wheels) General ADL Comments: Near his baseline, requiring min assist for LB ADL's due to weakness and fatigue     Vision Baseline Vision/History: 2 Legally blind Ability to See in Adequate Light: 4 Severely impaired Patient Visual Report: No change from baseline Vision Assessment?: No apparent visual deficits     Perception Perception Perception Tested?: No   Praxis Praxis Praxis tested?: Not tested    Pertinent Vitals/Pain  Pain Assessment Pain Assessment: No/denies pain     Hand Dominance Right   Extremity/Trunk Assessment Upper Extremity  Assessment Upper Extremity Assessment: Overall WFL for tasks assessed   Lower Extremity Assessment Lower Extremity Assessment: Defer to PT evaluation   Cervical / Trunk Assessment Cervical / Trunk Assessment: Normal   Communication Communication Communication: No difficulties   Cognition Arousal/Alertness: Awake/alert Behavior During Therapy: WFL for tasks assessed/performed Overall Cognitive Status: Within Functional Limits for tasks assessed                                       General Comments  VSS on RA    Exercises     Shoulder Instructions      Home Living Family/patient expects to be discharged to:: Private residence Living Arrangements: Spouse/significant other Available Help at Discharge: Family;Available 24 hours/day Type of Home: Apartment Home Access: Level entry     Home Layout: One level     Bathroom Shower/Tub: Producer, television/film/video: Standard Bathroom Accessibility: Yes How Accessible: Accessible via walker Home Equipment: BSC/3in1;Cane - single point;Rollator (4 wheels);Shower seat - built in          Prior Functioning/Environment Prior Level of Function : Independent/Modified Independent;History of Falls (last six months)             Mobility Comments: SPC in the house, RW in the community and for dialysis ADLs Comments: Reports Indep, Wife does all IADL"s for pt.        OT Problem List: Decreased strength;Decreased activity tolerance;Impaired balance (sitting and/or standing);Decreased safety awareness;Impaired vision/perception      OT Treatment/Interventions: Self-care/ADL training;Energy conservation;DME and/or AE instruction;Therapeutic activities;Patient/family education;Balance training    OT Goals(Current goals can be found in the care plan section) Acute Rehab OT Goals Patient Stated Goal: To go home today OT Goal Formulation: With patient Time For Goal Achievement: 01/05/23 Potential to Achieve  Goals: Good ADL Goals Pt Will Perform Grooming: with set-up;standing Pt Will Perform Lower Body Bathing: with set-up;sitting/lateral leans;sit to/from stand Pt Will Perform Lower Body Dressing: with set-up;sitting/lateral leans;sit to/from stand Pt Will Transfer to Toilet: with supervision;ambulating Pt Will Perform Toileting - Clothing Manipulation and hygiene: with set-up;sit to/from stand;sitting/lateral leans  OT Frequency: Min 2X/week    Co-evaluation              AM-PAC OT "6 Clicks" Daily Activity     Outcome Measure Help from another person eating meals?: A Little Help from another person taking care of personal grooming?: A Little Help from another person toileting, which includes using toliet, bedpan, or urinal?: A Little Help from another person bathing (including washing, rinsing, drying)?: A Little Help from another person to put on and taking off regular upper body clothing?: A Little Help from another person to put on and taking off regular lower body clothing?: A Little 6 Click Score: 18   End of Session Equipment Utilized During Treatment: Gait belt;Rolling walker (2 wheels) Nurse Communication: Mobility status  Activity Tolerance: Patient tolerated treatment well Patient left: in bed;with call bell/phone within reach;with bed alarm set  OT Visit Diagnosis: Unsteadiness on feet (R26.81);Other abnormalities of gait and mobility (R26.89);Muscle weakness (generalized) (M62.81)                Time: 1610-9604 OT Time Calculation (min): 15 min Charges:  OT General Charges $OT Visit: 1 Visit OT Evaluation $OT Eval Moderate  Complexity: 1 Mod  Nakiya Rallis Bing Plume, OTR/L [place] [facility]  Dotty Gonzalo Elane Bing Plume 12/22/2022, 10:55 AM

## 2022-12-22 NOTE — TOC Transition Note (Signed)
Transition of Care [facility]) - CM/SW Discharge Note   Patient Details  Name: Shawn [name] MRN: [id number] Date of Birth: 1961-07-05  Transition of Care [facility]) CM/SW Contact:  Tom-Johnson, Hershal Coria, RN Phone Number: 12/22/2022, 2:09 PM   Clinical Narrative:     Patient is scheduled for discharge today.  Readmission Risk Assessment done. Home health info, Outpatient f/u, hospital f/u and discharge instructions on AVS. Patient requested a cab at discharge. Therapist, nutritional and Release of Liability form explained to patient with understanding verbalized, form signed and placed in patient's chart. Cab voucher given to RN.  No further TOC needs noted.   Final next level of care: Home w Home Health Services Barriers to Discharge: Barriers Resolved   Patient Goals and CMS Choice CMS Medicare.gov Compare Post Acute Care list provided to:: Patient Choice offered to / list presented to : Patient  Discharge Placement                  Patient to be transferred to facility by: [facility]      Discharge Plan and Services Additional resources added to the After Visit Summary for                  DME Arranged: N/A DME Agency: NA       HH Arranged: PT, OT, RN, Disease Management, Nurse's Aide HH Agency: [facility] Date [facility] Agency Contacted: 12/22/22 Time HH Agency Contacted: 1400 Representative spoke with at [facility] Agency: Laurelyn Sickle  Social Determinants of Health (SDOH) Interventions SDOH Screenings   Food Insecurity: Food Insecurity Present (12/19/2022)  Housing: Medium Risk (12/19/2022)  Transportation Needs: Unmet Transportation Needs (12/20/2022)  Utilities: Not At Risk (12/19/2022)  Tobacco Use: Low Risk  (12/19/2022)     Readmission Risk Interventions    12/20/2022    1:41 PM  Readmission Risk Prevention Plan  Transportation Screening Complete  PCP or Specialist Appt within 5-7 Days Complete  Home Care Screening Complete  Medication Review (RN CM) Referral to  Pharmacy

## 2022-12-22 NOTE — Progress Notes (Signed)
DISCHARGE NOTE HOME Shawn [name] to be discharged Home per MD order. Discussed prescriptions and follow up appointments with the patient. Prescriptions given to patient; medication list explained in detail. Patient verbalized understanding.  Skin clean, dry and intact without evidence of skin break down, no evidence of skin tears noted. IV catheter discontinued intact. Site without signs and symptoms of complications. Dressing and pressure applied. Pt denies pain at the site currently. No complaints noted.  Patient free of lines, drains, and wounds.   An After Visit Summary (AVS) was printed and given to the patient. Patient escorted via wheelchair, and discharged home via [facility]  Oda Cogan, LPN

## 2022-12-22 NOTE — Plan of Care (Signed)
  Problem: Education: Goal: Ability to describe self-care measures that may prevent or decrease complications (Diabetes Survival Skills Education) will improve Outcome: Adequate for Discharge Goal: Individualized Educational Video(s) Outcome: Adequate for Discharge   Problem: Coping: Goal: Ability to adjust to condition or change in health will improve Outcome: Adequate for Discharge   Problem: Fluid Volume: Goal: Ability to maintain a balanced intake and output will improve Outcome: Adequate for Discharge   Problem: Health Behavior/Discharge Planning: Goal: Ability to identify and utilize available resources and services will improve Outcome: Adequate for Discharge Goal: Ability to manage health-related needs will improve Outcome: Adequate for Discharge   Problem: Metabolic: Goal: Ability to maintain appropriate glucose levels will improve Outcome: Adequate for Discharge   Problem: Nutritional: Goal: Maintenance of adequate nutrition will improve Outcome: Adequate for Discharge Goal: Progress toward achieving an optimal weight will improve Outcome: Adequate for Discharge   Problem: Skin Integrity: Goal: Risk for impaired skin integrity will decrease Outcome: Adequate for Discharge   Problem: Tissue Perfusion: Goal: Adequacy of tissue perfusion will improve Outcome: Adequate for Discharge   Problem: Education: Goal: Knowledge of General Education information will improve Description: Including pain rating scale, medication(s)/side effects and non-pharmacologic comfort measures Outcome: Adequate for Discharge   Problem: Health Behavior/Discharge Planning: Goal: Ability to manage health-related needs will improve Outcome: Adequate for Discharge   Problem: Clinical Measurements: Goal: Ability to maintain clinical measurements within normal limits will improve Outcome: Adequate for Discharge Goal: Will remain free from infection Outcome: Adequate for Discharge Goal:  Diagnostic test results will improve Outcome: Adequate for Discharge Goal: Respiratory complications will improve Outcome: Adequate for Discharge Goal: Cardiovascular complication will be avoided Outcome: Adequate for Discharge   Problem: Activity: Goal: Risk for activity intolerance will decrease Outcome: Adequate for Discharge   Problem: Nutrition: Goal: Adequate nutrition will be maintained Outcome: Adequate for Discharge   Problem: Coping: Goal: Level of anxiety will decrease Outcome: Adequate for Discharge   Problem: Elimination: Goal: Will not experience complications related to bowel motility Outcome: Adequate for Discharge Goal: Will not experience complications related to urinary retention Outcome: Adequate for Discharge   Problem: Pain Managment: Goal: General experience of comfort will improve Outcome: Adequate for Discharge   Problem: Safety: Goal: Ability to remain free from injury will improve Outcome: Adequate for Discharge   Problem: Skin Integrity: Goal: Risk for impaired skin integrity will decrease Outcome: Adequate for Discharge   Problem: Activity: Goal: Ability to tolerate increased activity will improve Outcome: Adequate for Discharge   Problem: Clinical Measurements: Goal: Ability to maintain a body temperature in the normal range will improve Outcome: Adequate for Discharge   Problem: Respiratory: Goal: Ability to maintain adequate ventilation will improve Outcome: Adequate for Discharge Goal: Ability to maintain a clear airway will improve Outcome: Adequate for Discharge   

## 2022-12-22 NOTE — Plan of Care (Signed)
[facility] Patient Discharge Orders - [facility] CLINIC: [place]  Patient's name: Shawn [name] Admit/DC Dates: 12/19/2022 - 12/22/2022  DISCHARGE DIAGNOSES: Acute Hypoxic Resp Failure -> combo HCAP + pulm edema/overload  Weakness  HD ORDER CHANGES: Heparin change: no EDW Change: yes New EDW:  71 kg Bath Change: no  ANEMIA MANAGEMENT: Aranesp: Given: yes   Amount/Date of last dose: on 12/21/22 ESA dose for discharge: mircera 150 mcg IV q 2 weeks, to start on 12/28/22 IV Iron dose at discharge: per protocol Transfusion: Given: no  BONE/MINERAL MEDICATIONS: Hectorol/Calcitriol change: no Sensipar/Parsabiv change: no  ACCESS INTERVENTION/CHANGE: no Details:   RECENT LABS: Recent Labs  Lab 12/21/22 0221  HGB 8.3*  NA 133*  K 4.2  CALCIUM 8.3*  PHOS 1.2*  ALBUMIN 1.9*    IV ANTIBIOTICS: no Details: will be finishing course of PO cefdinir  OTHER ANTICOAGULATION: On Coumadin?: no   OTHER/APPTS/LAB ORDERS: 1) Phos very low - fosrenol held here -> pls repeat next week to decide if needs to restart 2) Continue ONSP for low albumin   D/C Meds to be reconciled by nurse after every discharge.  Completed By:   Reviewed by: MD:______ RN_______

## 2022-12-22 NOTE — Evaluation (Signed)
Physical Therapy Evaluation Patient Details Name: Shawn [name] MRN: [id number] DOB: 13-Dec-1960 Today's Date: 12/22/2022  History of Present Illness  62 y.o. male admitted on 12/19/22 due to increased weakness and SoB. Pt found to have Pneumonia. PMHx: CVA, DM, HTN, ESRD, blind. Pt on CRRT/HD.  Clinical Impression  Pt presents today with impaired functional mobility limited by BLE weakness, balance, and activity tolerance deficits. Pt reports being mod I with his SPC around his home, but currently requiring minA for transfers and ambulation with SPC. Pt with moderate imbalance with ambulation, educated on use of walker upon discharge to utilize BUE support for balance, pt agreeable. Pt has full time care upon discharge and will likely do better in his familiar environment due to visual impairments. Pt will continue to benefit from skilled acute PT to progress mobility and balance, recommend HHPT upon discharge, pt agreeable. Will continue to follow as appropriate.        Recommendations for follow up therapy are one component of a multi-disciplinary discharge planning process, led by the attending physician.  Recommendations may be updated based on patient status, additional functional criteria and insurance authorization.  Follow Up Recommendations       Assistance Recommended at Discharge Frequent or constant Supervision/Assistance  Patient can return home with the following  A little help with walking and/or transfers;Assistance with cooking/housework;Assist for transportation;Direct supervision/assist for medications management    Equipment Recommendations None recommended by PT  Recommendations for Other Services       Functional Status Assessment Patient has had a recent decline in their functional status and demonstrates the ability to make significant improvements in function in a reasonable and predictable amount of time.     Precautions / Restrictions  Precautions Precautions: Fall Precaution Comments: Pt legally blind Restrictions Weight Bearing Restrictions: No      Mobility  Bed Mobility Overal bed mobility: Needs Assistance Bed Mobility: Supine to Sit, Sit to Supine     Supine to sit: Min assist, HOB elevated Sit to supine: Supervision, HOB elevated   General bed mobility comments: minA for trunk support to pull into sitting, increased time to scoot to the EOB, able to return to supine with supervision    Transfers Overall transfer level: Needs assistance Equipment used: Straight cane Transfers: Sit to/from Stand Sit to Stand: Min assist, From elevated surface           General transfer comment: minA to power up and steady from elevated bed to simulate his bed at home.    Ambulation/Gait Ambulation/Gait assistance: Min assist Gait Distance (Feet): 12 Feet Assistive device: Straight cane Gait Pattern/deviations: Step-to pattern, Decreased stride length, Wide base of support, Drifts right/left Gait velocity: decreased     General Gait Details: wide BOS with decreased B step length and foot clearance. Increased verbal cues provided due to visual deficits, minA provided for balance with moderate imbalance noted. Pt educated on utilizing his walker at home for BUE support due to imbalance  Stairs            Wheelchair Mobility    Modified Rankin (Stroke Patients Only)       Balance Overall balance assessment: Needs assistance Sitting-balance support: Feet supported, No upper extremity supported Sitting balance-Leahy Scale: Good     Standing balance support: Single extremity supported, During functional activity, Reliant on assistive device for balance Standing balance-Leahy Scale: Poor Standing balance comment: requiring external assist for balance along with SPC  Pertinent Vitals/Pain Pain Assessment Pain Assessment: No/denies pain    Home Living  Family/patient expects to be discharged to:: Private residence Living Arrangements: Spouse/significant other Available Help at Discharge: Family;Available 24 hours/day Type of Home: Apartment Home Access: Level entry       Home Layout: One level Home Equipment: BSC/3in1;Cane - single point;Rollator (4 wheels);Shower seat - built in      Prior Function Prior Level of Function : Independent/Modified Independent             Mobility Comments: SPC in the house, RW in the community and for dialysis ADLs Comments: Reports Indep, Wife does all IADL"s for pt.     Hand Dominance   Dominant Hand: Right    Extremity/Trunk Assessment   Upper Extremity Assessment Upper Extremity Assessment: Defer to OT evaluation    Lower Extremity Assessment Lower Extremity Assessment: Generalized weakness    Cervical / Trunk Assessment Cervical / Trunk Assessment: Normal  Communication   Communication: No difficulties  Cognition Arousal/Alertness: Awake/alert Behavior During Therapy: WFL for tasks assessed/performed Overall Cognitive Status: Within Functional Limits for tasks assessed                                 General Comments: A&Ox4, pleasant throughout session        General Comments General comments (skin integrity, edema, etc.): VSS on room air    Exercises     Assessment/Plan    PT Assessment Patient needs continued PT services  PT Problem List Decreased strength;Decreased activity tolerance;Decreased balance;Decreased mobility       PT Treatment Interventions DME instruction;Gait training;Functional mobility training;Therapeutic activities;Therapeutic exercise;Balance training;Neuromuscular re-education;Patient/family education    PT Goals (Current goals can be found in the Care Plan section)  Acute Rehab PT Goals Patient Stated Goal: get better PT Goal Formulation: With patient Time For Goal Achievement: 01/05/23 Potential to Achieve Goals:  Good    Frequency Min 3X/week     Co-evaluation               AM-PAC PT "6 Clicks" Mobility  Outcome Measure Help needed turning from your back to your side while in a flat bed without using bedrails?: A Little Help needed moving from lying on your back to sitting on the side of a flat bed without using bedrails?: A Little Help needed moving to and from a bed to a chair (including a wheelchair)?: A Little Help needed standing up from a chair using your arms (e.g., wheelchair or bedside chair)?: A Little Help needed to walk in hospital room?: A Little Help needed climbing 3-5 steps with a railing? : A Lot 6 Click Score: 17    End of Session Equipment Utilized During Treatment: Gait belt Activity Tolerance: Patient tolerated treatment well Patient left: in bed;with call bell/phone within reach;with bed alarm set Nurse Communication: Mobility status PT Visit Diagnosis: Unsteadiness on feet (R26.81);Muscle weakness (generalized) (M62.81);Difficulty in walking, not elsewhere classified (R26.2)    Time: 5956-3875 PT Time Calculation (min) (ACUTE ONLY): 16 min   Charges:   PT Evaluation $PT Eval Moderate Complexity: 1 Mod          Lindalou Hose, PT DPT Acute Rehabilitation Services Office [phone]   Shawn [name] 12/22/2022, 12:34 PM

## 2022-12-22 NOTE — Progress Notes (Signed)
[facility] Progress Note   Subjective:  Seen in room - says breathing is better. S/p HD yesterday - got down to 70.9, but reports had some cramping at the end. No CP or abd pain today. Possibly going home today per patient.  Objective Vitals:   12/21/22 1900 12/21/22 2049 12/22/22 0321 12/22/22 0810  BP:  137/62 (!) 153/64 (!) 152/70  Pulse:  83 83 88  Resp:  16 18 17   Temp:  99.3 F (37.4 C) 98.9 F (37.2 C) 98.9 F (37.2 C)  TempSrc:  Oral Oral Oral  SpO2:  96% 98% 95%  Weight: 70.9 kg     Height: 5\' 11"  (1.803 m)      Physical Exam General: Chronically ill appearing man, NAD. Room air. Heart: RRR; no murmur Lungs: CTA anterolaterally Abdomen: soft Extremities: no LE edema Dialysis Access:  [facility]  Additional Objective Labs: Basic Metabolic Panel: Recent Labs  Lab 12/19/22 0638 12/19/22 0652 12/20/22 0900 12/21/22 0221  NA 131* 131* 132* 133*  K 4.5 4.5 3.6 4.2  CL 92*  --  96* 95*  CO2 22  --  24 27  GLUCOSE 241*  --  184* 103*  BUN 17  --  10 16  CREATININE 6.56*  --  4.39* 5.42*  CALCIUM 8.7*  --  8.2* 8.3*  PHOS  --   --   --  1.2*   Liver Function Tests: Recent Labs  Lab 12/19/22 0638 12/20/22 0900 12/21/22 0221  AST 14* 12*  --   ALT 7 6  --   ALKPHOS 94 99  --   BILITOT 0.8 0.8  --   PROT 7.9 7.7  --   ALBUMIN 2.4* 2.3* 1.9*   Recent Labs  Lab 12/19/22 0638  LIPASE 26   CBC: Recent Labs  Lab 12/19/22 0638 12/19/22 0652 12/20/22 0900 12/21/22 0221  WBC 9.7  --  8.4 10.6*  NEUTROABS 8.2*  --   --   --   HGB 10.2* 11.9* 9.5* 8.3*  HCT 34.2* 35.0* 30.8* 27.1*  MCV 79.4*  --  79.0* 77.9*  PLT 337  --  318 366   Studies/Results: DG CHEST PORT 1 VIEW  Result Date: 12/22/2022 CLINICAL DATA:  142230 Pleural effusion 142230 EXAM: PORTABLE CHEST - 1 VIEW COMPARISON:  12/19/2022 FINDINGS: Tunneled right IJ hemodialysis catheter to the mid right atrium as before. Improved aeration with residual predominantly perihilar  interstitial and airspace opacities right greater than left. Heart size and mediastinal contours are within normal limits. Blunting of bilateral costophrenic angles. Vascular stents in the right upper arm.  Right shoulder DJD. IMPRESSION: Improved aeration with residual perihilar opacities and small effusions. Electronically Signed   By: Corlis Leak M.D.   On: 12/22/2022 08:49    Medications:  sodium chloride     azithromycin 500 mg (12/22/22 0809)   cefTRIAXone (ROCEPHIN)  IV 2 g (12/22/22 0810)    brimonidine  1 drop Both Eyes BID   Chlorhexidine Gluconate Cloth  6 each Topical Q0600   Chlorhexidine Gluconate Cloth  6 each Topical Q0600   darbepoetin (ARANESP) injection - NON-DIALYSIS  100 mcg Subcutaneous Q Fri-1800   dorzolamide-timolol  1 drop Both Eyes BID   erythromycin   Both Eyes QHS   feeding supplement (NEPRO CARB STEADY)  237 mL Oral BID BM   heparin  5,000 Units Subcutaneous Q8H   insulin aspart  0-5 Units Subcutaneous QHS   insulin aspart  0-6 Units Subcutaneous TID  WC   latanoprost  1 drop Both Eyes QHS    Dialysis Orders: [facility]  3h  400/1.5   76.5kg  3K/2.5 bath  TDC   Heparin 6500 - last OP HD 6/08 (4 HD last week), post wt 76.1kg - coming a bit under  last 2 weeks - venofer 50mg  IV weekly - mircera 120 mcg IV q 2 wks, last 5/29, due today 6/12 CXR - bilat early pulm IS edema     Assessment/ Plan: Dyspnea + CP: CXR with B infiltrates - pulm edema v. HCAP. Started on Ceftriaxone + azithromycin. S/p HD on admit, weight is now below prior EDW -> d/w patient, lower EDW to 71kg and go from there as OP. BP/volume: Will continue to challenge and lower EDW, BP still high.  ESRD: Continue HD on usual MWF schedule - next 6/17 (as outpatient) Anemia of ESRD: Hgb 8.3, Aranesp given 6/14.   Secondary hyperparathyroidism: CorrCa ok, Phos low - fosrenol held. T2DM Legally blind Hx CVA Nutrition: Alb very low, continue protein supplements + renal diet.  Dispo:  Ok for discharge from renal standpoint.  Ozzie Hoyle, PA-C 12/22/2022, 10:34 AM  [employer]

## 2022-12-22 NOTE — Discharge Summary (Signed)
Physician Discharge Summary  Patient ID: Shawn [name] MRN: [id number] DOB/AGE: [birth year] 62 y.o.  Admit date: 12/19/2022 Discharge date: 12/22/2022  Admission Diagnoses:  Discharge Diagnoses:  Principal Problem:   HCAP (healthcare-associated pneumonia) Active Problems:   ESRD (end stage renal disease) on dialysis [facility])   Essential hypertension   Diabetes mellitus with complication (HCC)   Pure hypercholesterolemia   Discharged Condition: stable  Hospital Course:  Patient is a 62 year old male past medical history significant for end-stage renal disease on hemodialysis on Monday, Wednesday and Friday since 2011.  Patient gets dialyzed via right chest TDC.  Other medical history includes hypertension, diabetes mellitus, hyperlipidemia, previous CVA, orthostatic hypotension.  Patient is legally blind.  Patient was admitted with generalized weakness and shortness of breath.  2 months history of coughing endorsed .  CT scan of the chest done revealed loculated bilateral pleural effusion, adjacent atelectasis, additional patchy infiltrate and enlarged bilateral hilar and mediastinal lymph nodes.  No history of fever or chills.  Patient was admitted for further assessment and management.  Nephrology team was consulted to assist with patient management.  Patient's symptoms improved with dialysis.  Patient was also treated with antibiotics.  Patient has improved significantly.  Patient will be discharged back home to the care of the primary care provider and the nephrology team.  Generalized weakness:  -Improved -PT OT.    HCAP:  -Continue IV Rocephin and Zithromax.     End-stage renal disease:  -On hemodialysis Monday Wednesday and Fridays.   -Patient was dialyzed during the hospital stay.   Diabetes mellitus: -Continue sliding scale insulin coverage.    Essential hypertension:  -Continue to optimize.     Bilateral blindness:  Legally blind.  Continue supportive care.     Hyperlipidemia:  -Continue statin  Consults: nephrology  Significant Diagnostic Studies:  CHEST CTA: 1. No evidence of pulmonary embolism. 2. Moderate loculated bilateral pleural effusions with adjacent atelectasis. 3. Additional patchy airspace opacities may reflect focal atelectasis or be infectious, inflammatory or neoplastic in etiology. 4. Enlarged bilateral hilar and mediastinal lymph nodes, indeterminate.   CT ABDOMEN/PELVIS: 1. Thick-walled bladder with surrounding fat stranding concerning for cystitis. Correlate with urinalysis. 2. Diffuse body wall edema and trace pelvic ascites. 3. Cholelithiasis without evidence of acute cholecystitis.  CXR revealed: Improved aeration with residual perihilar opacities and small effusions.   Aortic Atherosclerosis  Treatments:   Discharge Exam: Blood pressure (!) 152/70, pulse 88, temperature 98.9 F (37.2 C), temperature source Oral, resp. rate 17, height 5\' 11"  (1.803 m), weight 70.9 kg, SpO2 95 %.   Disposition: Discharge disposition: 01-Home or Self Care       Discharge Instructions     Diet - low sodium heart healthy   Complete by: As directed    Increase activity slowly   Complete by: As directed       Allergies as of 12/22/2022   No Known Allergies      Medication List     STOP taking these medications    diphenhydramine-acetaminophen 25-500 MG Tabs tablet Commonly known as: TYLENOL PM   metroNIDAZOLE 500 MG tablet Commonly known as: FLAGYL       TAKE these medications    amLODipine 5 MG tablet Commonly known as: NORVASC Take 5 mg by mouth daily.   aspirin EC 325 MG tablet Take 325 mg by mouth at bedtime.   b complex vitamins tablet Take 1 tablet by mouth at bedtime.   brimonidine 0.2 % ophthalmic solution Commonly known as:  ALPHAGAN Place 1 drop into both eyes 2 (two) times daily.   calcitRIOL 0.5 MCG capsule Commonly known as: ROCALTROL Take 1 capsule (0.5 mcg total) by mouth  3 (three) times a week. What changed: when to take this   cefdinir 300 MG capsule Commonly known as: OMNICEF Take 1 capsule (300 mg total) by mouth 2 (two) times daily for 5 days.   Darbepoetin Alfa 100 MCG/0.5ML Sosy injection Commonly known as: ARANESP Inject 0.5 mLs (100 mcg total) into the skin every Friday at 6 PM. Start taking on: December 28, 2022   dorzolamide-timolol 2-0.5 % ophthalmic solution Commonly known as: COSOPT Place 1 drop into both eyes 2 (two) times daily.   erythromycin ophthalmic ointment Place 1 Application into both eyes at bedtime.   feeding supplement (NEPRO CARB STEADY) Liqd Take 237 mLs by mouth 2 (two) times daily between meals.   lanthanum 1000 MG chewable tablet Commonly known as: FOSRENOL Chew 1,000 mg by mouth 3 (three) times daily with meals.   latanoprost 0.005 % ophthalmic solution Commonly known as: XALATAN Place 1 drop into both eyes at bedtime.   lisinopril 20 MG tablet Commonly known as: ZESTRIL Take 20 mg by mouth 2 (two) times daily.   rosuvastatin 40 MG tablet Commonly known as: CRESTOR Take 40 mg by mouth daily.   Evaristo Bury FlexTouch 100 UNIT/ML FlexTouch Pen Generic drug: insulin degludec Inject 18 Units into the skin at bedtime.         SignedBarnetta Chapel 12/22/2022, 12:38 PM

## 2022-12-22 NOTE — Hospital Course (Signed)
61yo with h/o ESRD on MWF HD, DM, HTN, and CVA who presented on 6/12 with HCAP.

## 2022-12-23 ENCOUNTER — Telehealth (HOSPITAL_COMMUNITY): Payer: Self-pay | Admitting: Nephrology

## 2022-12-23 NOTE — Telephone Encounter (Signed)
Transition of Care - Initial Contact from Inpatient Facility  Date of discharge: 12/22/22 Date of contact: 12/23/22  Method: Phone Spoke to: Patient  Patient contacted to discuss transition of care from recent inpatient hospitalization. Patient was admitted to [facility] from 6/12-6/15/24 with discharge diagnosis of AHRF - felt HCAP + overload, EDW lowered significantly.  The discharge medication list was reviewed. Patient understands the changes and has no concerns. Off binder, picked up PO abx.  Patient will return to his/her outpatient HD unit on: Monday 6/17 - tomorrow.  No other concerns at this time.  Ozzie Hoyle, PA-C [employer] Pager [phone]

## 2022-12-24 DIAGNOSIS — D509 Iron deficiency anemia, unspecified: Secondary | ICD-10-CM | POA: Diagnosis not present

## 2022-12-24 DIAGNOSIS — E876 Hypokalemia: Secondary | ICD-10-CM | POA: Diagnosis not present

## 2022-12-24 DIAGNOSIS — Z992 Dependence on renal dialysis: Secondary | ICD-10-CM | POA: Diagnosis not present

## 2022-12-24 DIAGNOSIS — D631 Anemia in chronic kidney disease: Secondary | ICD-10-CM | POA: Diagnosis not present

## 2022-12-24 DIAGNOSIS — N186 End stage renal disease: Secondary | ICD-10-CM | POA: Diagnosis not present

## 2022-12-24 LAB — HEPATITIS B SURFACE ANTIBODY, QUANTITATIVE: Hep B S AB Quant (Post): 87.2 m[IU]/mL (ref >9.9)

## 2022-12-24 NOTE — Progress Notes (Signed)
Late Note Entry  Pt was d/c on Saturday. Contacted [facility] this morning to advise clinic of pt's d/c date and that pt should resume care today.   Olivia Canter Renal Navigator [phone]

## 2022-12-26 DIAGNOSIS — Z992 Dependence on renal dialysis: Secondary | ICD-10-CM | POA: Diagnosis not present

## 2022-12-26 DIAGNOSIS — E876 Hypokalemia: Secondary | ICD-10-CM | POA: Diagnosis not present

## 2022-12-26 DIAGNOSIS — D509 Iron deficiency anemia, unspecified: Secondary | ICD-10-CM | POA: Diagnosis not present

## 2022-12-26 DIAGNOSIS — D631 Anemia in chronic kidney disease: Secondary | ICD-10-CM | POA: Diagnosis not present

## 2022-12-26 DIAGNOSIS — N186 End stage renal disease: Secondary | ICD-10-CM | POA: Diagnosis not present

## 2022-12-28 DIAGNOSIS — E876 Hypokalemia: Secondary | ICD-10-CM | POA: Diagnosis not present

## 2022-12-28 DIAGNOSIS — D509 Iron deficiency anemia, unspecified: Secondary | ICD-10-CM | POA: Diagnosis not present

## 2022-12-28 DIAGNOSIS — D631 Anemia in chronic kidney disease: Secondary | ICD-10-CM | POA: Diagnosis not present

## 2022-12-28 DIAGNOSIS — N186 End stage renal disease: Secondary | ICD-10-CM | POA: Diagnosis not present

## 2022-12-28 DIAGNOSIS — Z992 Dependence on renal dialysis: Secondary | ICD-10-CM | POA: Diagnosis not present

## 2022-12-30 DIAGNOSIS — Z7982 Long term (current) use of aspirin: Secondary | ICD-10-CM | POA: Diagnosis not present

## 2022-12-30 DIAGNOSIS — I130 Hypertensive heart and chronic kidney disease with heart failure and stage 1 through stage 4 chronic kidney disease, or unspecified chronic kidney disease: Secondary | ICD-10-CM | POA: Diagnosis not present

## 2022-12-30 DIAGNOSIS — E213 Hyperparathyroidism, unspecified: Secondary | ICD-10-CM | POA: Diagnosis not present

## 2022-12-30 DIAGNOSIS — Z85830 Personal history of malignant neoplasm of bone: Secondary | ICD-10-CM | POA: Diagnosis not present

## 2022-12-30 DIAGNOSIS — I5022 Chronic systolic (congestive) heart failure: Secondary | ICD-10-CM | POA: Diagnosis not present

## 2022-12-30 DIAGNOSIS — H409 Unspecified glaucoma: Secondary | ICD-10-CM | POA: Diagnosis not present

## 2022-12-30 DIAGNOSIS — M1990 Unspecified osteoarthritis, unspecified site: Secondary | ICD-10-CM | POA: Diagnosis not present

## 2022-12-30 DIAGNOSIS — H548 Legal blindness, as defined in USA: Secondary | ICD-10-CM | POA: Diagnosis not present

## 2022-12-30 DIAGNOSIS — Z992 Dependence on renal dialysis: Secondary | ICD-10-CM | POA: Diagnosis not present

## 2022-12-30 DIAGNOSIS — E7800 Pure hypercholesterolemia, unspecified: Secondary | ICD-10-CM | POA: Diagnosis not present

## 2022-12-30 DIAGNOSIS — J189 Pneumonia, unspecified organism: Secondary | ICD-10-CM | POA: Diagnosis not present

## 2022-12-30 DIAGNOSIS — J9690 Respiratory failure, unspecified, unspecified whether with hypoxia or hypercapnia: Secondary | ICD-10-CM | POA: Diagnosis not present

## 2022-12-30 DIAGNOSIS — Z8673 Personal history of transient ischemic attack (TIA), and cerebral infarction without residual deficits: Secondary | ICD-10-CM | POA: Diagnosis not present

## 2022-12-30 DIAGNOSIS — Z556 Problems related to health literacy: Secondary | ICD-10-CM | POA: Diagnosis not present

## 2022-12-30 DIAGNOSIS — D631 Anemia in chronic kidney disease: Secondary | ICD-10-CM | POA: Diagnosis not present

## 2022-12-30 DIAGNOSIS — E1122 Type 2 diabetes mellitus with diabetic chronic kidney disease: Secondary | ICD-10-CM | POA: Diagnosis not present

## 2022-12-30 DIAGNOSIS — Z853 Personal history of malignant neoplasm of breast: Secondary | ICD-10-CM | POA: Diagnosis not present

## 2022-12-30 DIAGNOSIS — E1151 Type 2 diabetes mellitus with diabetic peripheral angiopathy without gangrene: Secondary | ICD-10-CM | POA: Diagnosis not present

## 2022-12-30 DIAGNOSIS — Z794 Long term (current) use of insulin: Secondary | ICD-10-CM | POA: Diagnosis not present

## 2022-12-30 DIAGNOSIS — Z604 Social exclusion and rejection: Secondary | ICD-10-CM | POA: Diagnosis not present

## 2022-12-30 DIAGNOSIS — N186 End stage renal disease: Secondary | ICD-10-CM | POA: Diagnosis not present

## 2022-12-30 DIAGNOSIS — I951 Orthostatic hypotension: Secondary | ICD-10-CM | POA: Diagnosis not present

## 2022-12-31 DIAGNOSIS — D509 Iron deficiency anemia, unspecified: Secondary | ICD-10-CM | POA: Diagnosis not present

## 2022-12-31 DIAGNOSIS — Z992 Dependence on renal dialysis: Secondary | ICD-10-CM | POA: Diagnosis not present

## 2022-12-31 DIAGNOSIS — D631 Anemia in chronic kidney disease: Secondary | ICD-10-CM | POA: Diagnosis not present

## 2022-12-31 DIAGNOSIS — E876 Hypokalemia: Secondary | ICD-10-CM | POA: Diagnosis not present

## 2022-12-31 DIAGNOSIS — N186 End stage renal disease: Secondary | ICD-10-CM | POA: Diagnosis not present

## 2023-01-01 DIAGNOSIS — N186 End stage renal disease: Secondary | ICD-10-CM | POA: Diagnosis not present

## 2023-01-01 DIAGNOSIS — I130 Hypertensive heart and chronic kidney disease with heart failure and stage 1 through stage 4 chronic kidney disease, or unspecified chronic kidney disease: Secondary | ICD-10-CM | POA: Diagnosis not present

## 2023-01-01 DIAGNOSIS — D631 Anemia in chronic kidney disease: Secondary | ICD-10-CM | POA: Diagnosis not present

## 2023-01-01 DIAGNOSIS — J189 Pneumonia, unspecified organism: Secondary | ICD-10-CM | POA: Diagnosis not present

## 2023-01-01 DIAGNOSIS — E1122 Type 2 diabetes mellitus with diabetic chronic kidney disease: Secondary | ICD-10-CM | POA: Diagnosis not present

## 2023-01-01 DIAGNOSIS — I5022 Chronic systolic (congestive) heart failure: Secondary | ICD-10-CM | POA: Diagnosis not present

## 2023-01-02 DIAGNOSIS — Z992 Dependence on renal dialysis: Secondary | ICD-10-CM | POA: Diagnosis not present

## 2023-01-02 DIAGNOSIS — N186 End stage renal disease: Secondary | ICD-10-CM | POA: Diagnosis not present

## 2023-01-02 DIAGNOSIS — D509 Iron deficiency anemia, unspecified: Secondary | ICD-10-CM | POA: Diagnosis not present

## 2023-01-02 DIAGNOSIS — D631 Anemia in chronic kidney disease: Secondary | ICD-10-CM | POA: Diagnosis not present

## 2023-01-02 DIAGNOSIS — E876 Hypokalemia: Secondary | ICD-10-CM | POA: Diagnosis not present

## 2023-01-04 DIAGNOSIS — J189 Pneumonia, unspecified organism: Secondary | ICD-10-CM | POA: Diagnosis not present

## 2023-01-04 DIAGNOSIS — I130 Hypertensive heart and chronic kidney disease with heart failure and stage 1 through stage 4 chronic kidney disease, or unspecified chronic kidney disease: Secondary | ICD-10-CM | POA: Diagnosis not present

## 2023-01-04 DIAGNOSIS — E876 Hypokalemia: Secondary | ICD-10-CM | POA: Diagnosis not present

## 2023-01-04 DIAGNOSIS — Z992 Dependence on renal dialysis: Secondary | ICD-10-CM | POA: Diagnosis not present

## 2023-01-04 DIAGNOSIS — I5022 Chronic systolic (congestive) heart failure: Secondary | ICD-10-CM | POA: Diagnosis not present

## 2023-01-04 DIAGNOSIS — D509 Iron deficiency anemia, unspecified: Secondary | ICD-10-CM | POA: Diagnosis not present

## 2023-01-04 DIAGNOSIS — E1122 Type 2 diabetes mellitus with diabetic chronic kidney disease: Secondary | ICD-10-CM | POA: Diagnosis not present

## 2023-01-04 DIAGNOSIS — D631 Anemia in chronic kidney disease: Secondary | ICD-10-CM | POA: Diagnosis not present

## 2023-01-04 DIAGNOSIS — N186 End stage renal disease: Secondary | ICD-10-CM | POA: Diagnosis not present

## 2023-01-07 DIAGNOSIS — E876 Hypokalemia: Secondary | ICD-10-CM | POA: Diagnosis not present

## 2023-01-07 DIAGNOSIS — Z992 Dependence on renal dialysis: Secondary | ICD-10-CM | POA: Diagnosis not present

## 2023-01-07 DIAGNOSIS — N186 End stage renal disease: Secondary | ICD-10-CM | POA: Diagnosis not present

## 2023-01-07 DIAGNOSIS — N2581 Secondary hyperparathyroidism of renal origin: Secondary | ICD-10-CM | POA: Diagnosis not present

## 2023-01-07 DIAGNOSIS — I120 Hypertensive chronic kidney disease with stage 5 chronic kidney disease or end stage renal disease: Secondary | ICD-10-CM | POA: Diagnosis not present

## 2023-01-07 DIAGNOSIS — D689 Coagulation defect, unspecified: Secondary | ICD-10-CM | POA: Diagnosis not present

## 2023-01-07 DIAGNOSIS — E119 Type 2 diabetes mellitus without complications: Secondary | ICD-10-CM | POA: Diagnosis not present

## 2023-01-07 DIAGNOSIS — D509 Iron deficiency anemia, unspecified: Secondary | ICD-10-CM | POA: Diagnosis not present

## 2023-01-07 DIAGNOSIS — D631 Anemia in chronic kidney disease: Secondary | ICD-10-CM | POA: Diagnosis not present

## 2023-01-08 DIAGNOSIS — D631 Anemia in chronic kidney disease: Secondary | ICD-10-CM | POA: Diagnosis not present

## 2023-01-08 DIAGNOSIS — D689 Coagulation defect, unspecified: Secondary | ICD-10-CM | POA: Diagnosis not present

## 2023-01-08 DIAGNOSIS — N186 End stage renal disease: Secondary | ICD-10-CM | POA: Diagnosis not present

## 2023-01-08 DIAGNOSIS — E876 Hypokalemia: Secondary | ICD-10-CM | POA: Diagnosis not present

## 2023-01-08 DIAGNOSIS — Z992 Dependence on renal dialysis: Secondary | ICD-10-CM | POA: Diagnosis not present

## 2023-01-08 DIAGNOSIS — D509 Iron deficiency anemia, unspecified: Secondary | ICD-10-CM | POA: Diagnosis not present

## 2023-01-09 DIAGNOSIS — D631 Anemia in chronic kidney disease: Secondary | ICD-10-CM | POA: Diagnosis not present

## 2023-01-09 DIAGNOSIS — E1122 Type 2 diabetes mellitus with diabetic chronic kidney disease: Secondary | ICD-10-CM | POA: Diagnosis not present

## 2023-01-09 DIAGNOSIS — I5022 Chronic systolic (congestive) heart failure: Secondary | ICD-10-CM | POA: Diagnosis not present

## 2023-01-09 DIAGNOSIS — I130 Hypertensive heart and chronic kidney disease with heart failure and stage 1 through stage 4 chronic kidney disease, or unspecified chronic kidney disease: Secondary | ICD-10-CM | POA: Diagnosis not present

## 2023-01-09 DIAGNOSIS — N186 End stage renal disease: Secondary | ICD-10-CM | POA: Diagnosis not present

## 2023-01-09 DIAGNOSIS — J189 Pneumonia, unspecified organism: Secondary | ICD-10-CM | POA: Diagnosis not present

## 2023-01-11 DIAGNOSIS — E876 Hypokalemia: Secondary | ICD-10-CM | POA: Diagnosis not present

## 2023-01-11 DIAGNOSIS — D631 Anemia in chronic kidney disease: Secondary | ICD-10-CM | POA: Diagnosis not present

## 2023-01-11 DIAGNOSIS — Z992 Dependence on renal dialysis: Secondary | ICD-10-CM | POA: Diagnosis not present

## 2023-01-11 DIAGNOSIS — D689 Coagulation defect, unspecified: Secondary | ICD-10-CM | POA: Diagnosis not present

## 2023-01-11 DIAGNOSIS — D509 Iron deficiency anemia, unspecified: Secondary | ICD-10-CM | POA: Diagnosis not present

## 2023-01-11 DIAGNOSIS — N186 End stage renal disease: Secondary | ICD-10-CM | POA: Diagnosis not present

## 2023-01-14 DIAGNOSIS — D509 Iron deficiency anemia, unspecified: Secondary | ICD-10-CM | POA: Diagnosis not present

## 2023-01-14 DIAGNOSIS — Z992 Dependence on renal dialysis: Secondary | ICD-10-CM | POA: Diagnosis not present

## 2023-01-14 DIAGNOSIS — E876 Hypokalemia: Secondary | ICD-10-CM | POA: Diagnosis not present

## 2023-01-14 DIAGNOSIS — N186 End stage renal disease: Secondary | ICD-10-CM | POA: Diagnosis not present

## 2023-01-14 DIAGNOSIS — D689 Coagulation defect, unspecified: Secondary | ICD-10-CM | POA: Diagnosis not present

## 2023-01-14 DIAGNOSIS — D631 Anemia in chronic kidney disease: Secondary | ICD-10-CM | POA: Diagnosis not present

## 2023-01-15 DIAGNOSIS — E1122 Type 2 diabetes mellitus with diabetic chronic kidney disease: Secondary | ICD-10-CM | POA: Diagnosis not present

## 2023-01-15 DIAGNOSIS — I130 Hypertensive heart and chronic kidney disease with heart failure and stage 1 through stage 4 chronic kidney disease, or unspecified chronic kidney disease: Secondary | ICD-10-CM | POA: Diagnosis not present

## 2023-01-15 DIAGNOSIS — I5022 Chronic systolic (congestive) heart failure: Secondary | ICD-10-CM | POA: Diagnosis not present

## 2023-01-15 DIAGNOSIS — J189 Pneumonia, unspecified organism: Secondary | ICD-10-CM | POA: Diagnosis not present

## 2023-01-15 DIAGNOSIS — N186 End stage renal disease: Secondary | ICD-10-CM | POA: Diagnosis not present

## 2023-01-15 DIAGNOSIS — D631 Anemia in chronic kidney disease: Secondary | ICD-10-CM | POA: Diagnosis not present

## 2023-01-16 DIAGNOSIS — D631 Anemia in chronic kidney disease: Secondary | ICD-10-CM | POA: Diagnosis not present

## 2023-01-16 DIAGNOSIS — I130 Hypertensive heart and chronic kidney disease with heart failure and stage 1 through stage 4 chronic kidney disease, or unspecified chronic kidney disease: Secondary | ICD-10-CM | POA: Diagnosis not present

## 2023-01-16 DIAGNOSIS — Z992 Dependence on renal dialysis: Secondary | ICD-10-CM | POA: Diagnosis not present

## 2023-01-16 DIAGNOSIS — J189 Pneumonia, unspecified organism: Secondary | ICD-10-CM | POA: Diagnosis not present

## 2023-01-16 DIAGNOSIS — I5022 Chronic systolic (congestive) heart failure: Secondary | ICD-10-CM | POA: Diagnosis not present

## 2023-01-16 DIAGNOSIS — D509 Iron deficiency anemia, unspecified: Secondary | ICD-10-CM | POA: Diagnosis not present

## 2023-01-16 DIAGNOSIS — D689 Coagulation defect, unspecified: Secondary | ICD-10-CM | POA: Diagnosis not present

## 2023-01-16 DIAGNOSIS — E876 Hypokalemia: Secondary | ICD-10-CM | POA: Diagnosis not present

## 2023-01-16 DIAGNOSIS — N186 End stage renal disease: Secondary | ICD-10-CM | POA: Diagnosis not present

## 2023-01-16 DIAGNOSIS — E1122 Type 2 diabetes mellitus with diabetic chronic kidney disease: Secondary | ICD-10-CM | POA: Diagnosis not present

## 2023-01-18 DIAGNOSIS — E876 Hypokalemia: Secondary | ICD-10-CM | POA: Diagnosis not present

## 2023-01-18 DIAGNOSIS — D631 Anemia in chronic kidney disease: Secondary | ICD-10-CM | POA: Diagnosis not present

## 2023-01-18 DIAGNOSIS — D689 Coagulation defect, unspecified: Secondary | ICD-10-CM | POA: Diagnosis not present

## 2023-01-18 DIAGNOSIS — Z992 Dependence on renal dialysis: Secondary | ICD-10-CM | POA: Diagnosis not present

## 2023-01-18 DIAGNOSIS — N186 End stage renal disease: Secondary | ICD-10-CM | POA: Diagnosis not present

## 2023-01-18 DIAGNOSIS — D509 Iron deficiency anemia, unspecified: Secondary | ICD-10-CM | POA: Diagnosis not present

## 2023-01-21 DIAGNOSIS — D509 Iron deficiency anemia, unspecified: Secondary | ICD-10-CM | POA: Diagnosis not present

## 2023-01-21 DIAGNOSIS — D631 Anemia in chronic kidney disease: Secondary | ICD-10-CM | POA: Diagnosis not present

## 2023-01-21 DIAGNOSIS — E876 Hypokalemia: Secondary | ICD-10-CM | POA: Diagnosis not present

## 2023-01-21 DIAGNOSIS — N186 End stage renal disease: Secondary | ICD-10-CM | POA: Diagnosis not present

## 2023-01-21 DIAGNOSIS — Z992 Dependence on renal dialysis: Secondary | ICD-10-CM | POA: Diagnosis not present

## 2023-01-21 DIAGNOSIS — D689 Coagulation defect, unspecified: Secondary | ICD-10-CM | POA: Diagnosis not present

## 2023-01-22 DIAGNOSIS — E1122 Type 2 diabetes mellitus with diabetic chronic kidney disease: Secondary | ICD-10-CM | POA: Diagnosis not present

## 2023-01-22 DIAGNOSIS — N186 End stage renal disease: Secondary | ICD-10-CM | POA: Diagnosis not present

## 2023-01-22 DIAGNOSIS — D631 Anemia in chronic kidney disease: Secondary | ICD-10-CM | POA: Diagnosis not present

## 2023-01-22 DIAGNOSIS — I5022 Chronic systolic (congestive) heart failure: Secondary | ICD-10-CM | POA: Diagnosis not present

## 2023-01-22 DIAGNOSIS — J189 Pneumonia, unspecified organism: Secondary | ICD-10-CM | POA: Diagnosis not present

## 2023-01-22 DIAGNOSIS — I130 Hypertensive heart and chronic kidney disease with heart failure and stage 1 through stage 4 chronic kidney disease, or unspecified chronic kidney disease: Secondary | ICD-10-CM | POA: Diagnosis not present

## 2023-01-23 DIAGNOSIS — D631 Anemia in chronic kidney disease: Secondary | ICD-10-CM | POA: Diagnosis not present

## 2023-01-23 DIAGNOSIS — D509 Iron deficiency anemia, unspecified: Secondary | ICD-10-CM | POA: Diagnosis not present

## 2023-01-23 DIAGNOSIS — Z992 Dependence on renal dialysis: Secondary | ICD-10-CM | POA: Diagnosis not present

## 2023-01-23 DIAGNOSIS — D689 Coagulation defect, unspecified: Secondary | ICD-10-CM | POA: Diagnosis not present

## 2023-01-23 DIAGNOSIS — E876 Hypokalemia: Secondary | ICD-10-CM | POA: Diagnosis not present

## 2023-01-23 DIAGNOSIS — N186 End stage renal disease: Secondary | ICD-10-CM | POA: Diagnosis not present

## 2023-01-24 DIAGNOSIS — I130 Hypertensive heart and chronic kidney disease with heart failure and stage 1 through stage 4 chronic kidney disease, or unspecified chronic kidney disease: Secondary | ICD-10-CM | POA: Diagnosis not present

## 2023-01-24 DIAGNOSIS — E1122 Type 2 diabetes mellitus with diabetic chronic kidney disease: Secondary | ICD-10-CM | POA: Diagnosis not present

## 2023-01-24 DIAGNOSIS — I5022 Chronic systolic (congestive) heart failure: Secondary | ICD-10-CM | POA: Diagnosis not present

## 2023-01-24 DIAGNOSIS — D631 Anemia in chronic kidney disease: Secondary | ICD-10-CM | POA: Diagnosis not present

## 2023-01-24 DIAGNOSIS — J189 Pneumonia, unspecified organism: Secondary | ICD-10-CM | POA: Diagnosis not present

## 2023-01-24 DIAGNOSIS — N186 End stage renal disease: Secondary | ICD-10-CM | POA: Diagnosis not present

## 2023-01-25 DIAGNOSIS — D689 Coagulation defect, unspecified: Secondary | ICD-10-CM | POA: Diagnosis not present

## 2023-01-25 DIAGNOSIS — D509 Iron deficiency anemia, unspecified: Secondary | ICD-10-CM | POA: Diagnosis not present

## 2023-01-25 DIAGNOSIS — Z992 Dependence on renal dialysis: Secondary | ICD-10-CM | POA: Diagnosis not present

## 2023-01-25 DIAGNOSIS — D631 Anemia in chronic kidney disease: Secondary | ICD-10-CM | POA: Diagnosis not present

## 2023-01-25 DIAGNOSIS — E876 Hypokalemia: Secondary | ICD-10-CM | POA: Diagnosis not present

## 2023-01-25 DIAGNOSIS — N186 End stage renal disease: Secondary | ICD-10-CM | POA: Diagnosis not present

## 2023-01-28 DIAGNOSIS — D509 Iron deficiency anemia, unspecified: Secondary | ICD-10-CM | POA: Diagnosis not present

## 2023-01-28 DIAGNOSIS — D631 Anemia in chronic kidney disease: Secondary | ICD-10-CM | POA: Diagnosis not present

## 2023-01-28 DIAGNOSIS — E876 Hypokalemia: Secondary | ICD-10-CM | POA: Diagnosis not present

## 2023-01-28 DIAGNOSIS — D689 Coagulation defect, unspecified: Secondary | ICD-10-CM | POA: Diagnosis not present

## 2023-01-28 DIAGNOSIS — Z992 Dependence on renal dialysis: Secondary | ICD-10-CM | POA: Diagnosis not present

## 2023-01-28 DIAGNOSIS — N186 End stage renal disease: Secondary | ICD-10-CM | POA: Diagnosis not present

## 2023-01-29 DIAGNOSIS — I951 Orthostatic hypotension: Secondary | ICD-10-CM | POA: Diagnosis not present

## 2023-01-29 DIAGNOSIS — E7800 Pure hypercholesterolemia, unspecified: Secondary | ICD-10-CM | POA: Diagnosis not present

## 2023-01-29 DIAGNOSIS — I5022 Chronic systolic (congestive) heart failure: Secondary | ICD-10-CM | POA: Diagnosis not present

## 2023-01-29 DIAGNOSIS — Z556 Problems related to health literacy: Secondary | ICD-10-CM | POA: Diagnosis not present

## 2023-01-29 DIAGNOSIS — E1151 Type 2 diabetes mellitus with diabetic peripheral angiopathy without gangrene: Secondary | ICD-10-CM | POA: Diagnosis not present

## 2023-01-29 DIAGNOSIS — Z8673 Personal history of transient ischemic attack (TIA), and cerebral infarction without residual deficits: Secondary | ICD-10-CM | POA: Diagnosis not present

## 2023-01-29 DIAGNOSIS — D631 Anemia in chronic kidney disease: Secondary | ICD-10-CM | POA: Diagnosis not present

## 2023-01-29 DIAGNOSIS — Z853 Personal history of malignant neoplasm of breast: Secondary | ICD-10-CM | POA: Diagnosis not present

## 2023-01-29 DIAGNOSIS — Z7982 Long term (current) use of aspirin: Secondary | ICD-10-CM | POA: Diagnosis not present

## 2023-01-29 DIAGNOSIS — Z794 Long term (current) use of insulin: Secondary | ICD-10-CM | POA: Diagnosis not present

## 2023-01-29 DIAGNOSIS — N186 End stage renal disease: Secondary | ICD-10-CM | POA: Diagnosis not present

## 2023-01-29 DIAGNOSIS — I130 Hypertensive heart and chronic kidney disease with heart failure and stage 1 through stage 4 chronic kidney disease, or unspecified chronic kidney disease: Secondary | ICD-10-CM | POA: Diagnosis not present

## 2023-01-29 DIAGNOSIS — J9690 Respiratory failure, unspecified, unspecified whether with hypoxia or hypercapnia: Secondary | ICD-10-CM | POA: Diagnosis not present

## 2023-01-29 DIAGNOSIS — H548 Legal blindness, as defined in USA: Secondary | ICD-10-CM | POA: Diagnosis not present

## 2023-01-29 DIAGNOSIS — Z85830 Personal history of malignant neoplasm of bone: Secondary | ICD-10-CM | POA: Diagnosis not present

## 2023-01-29 DIAGNOSIS — E1122 Type 2 diabetes mellitus with diabetic chronic kidney disease: Secondary | ICD-10-CM | POA: Diagnosis not present

## 2023-01-29 DIAGNOSIS — J189 Pneumonia, unspecified organism: Secondary | ICD-10-CM | POA: Diagnosis not present

## 2023-01-29 DIAGNOSIS — E213 Hyperparathyroidism, unspecified: Secondary | ICD-10-CM | POA: Diagnosis not present

## 2023-01-29 DIAGNOSIS — H409 Unspecified glaucoma: Secondary | ICD-10-CM | POA: Diagnosis not present

## 2023-01-29 DIAGNOSIS — Z604 Social exclusion and rejection: Secondary | ICD-10-CM | POA: Diagnosis not present

## 2023-01-29 DIAGNOSIS — M1990 Unspecified osteoarthritis, unspecified site: Secondary | ICD-10-CM | POA: Diagnosis not present

## 2023-01-29 DIAGNOSIS — Z992 Dependence on renal dialysis: Secondary | ICD-10-CM | POA: Diagnosis not present

## 2023-01-30 DIAGNOSIS — Z992 Dependence on renal dialysis: Secondary | ICD-10-CM | POA: Diagnosis not present

## 2023-01-30 DIAGNOSIS — I130 Hypertensive heart and chronic kidney disease with heart failure and stage 1 through stage 4 chronic kidney disease, or unspecified chronic kidney disease: Secondary | ICD-10-CM | POA: Diagnosis not present

## 2023-01-30 DIAGNOSIS — J189 Pneumonia, unspecified organism: Secondary | ICD-10-CM | POA: Diagnosis not present

## 2023-01-30 DIAGNOSIS — E1122 Type 2 diabetes mellitus with diabetic chronic kidney disease: Secondary | ICD-10-CM | POA: Diagnosis not present

## 2023-01-30 DIAGNOSIS — D631 Anemia in chronic kidney disease: Secondary | ICD-10-CM | POA: Diagnosis not present

## 2023-01-30 DIAGNOSIS — N186 End stage renal disease: Secondary | ICD-10-CM | POA: Diagnosis not present

## 2023-01-30 DIAGNOSIS — I5022 Chronic systolic (congestive) heart failure: Secondary | ICD-10-CM | POA: Diagnosis not present

## 2023-01-31 DIAGNOSIS — Z992 Dependence on renal dialysis: Secondary | ICD-10-CM | POA: Diagnosis not present

## 2023-01-31 DIAGNOSIS — E876 Hypokalemia: Secondary | ICD-10-CM | POA: Diagnosis not present

## 2023-01-31 DIAGNOSIS — D509 Iron deficiency anemia, unspecified: Secondary | ICD-10-CM | POA: Diagnosis not present

## 2023-01-31 DIAGNOSIS — D689 Coagulation defect, unspecified: Secondary | ICD-10-CM | POA: Diagnosis not present

## 2023-01-31 DIAGNOSIS — D631 Anemia in chronic kidney disease: Secondary | ICD-10-CM | POA: Diagnosis not present

## 2023-01-31 DIAGNOSIS — N186 End stage renal disease: Secondary | ICD-10-CM | POA: Diagnosis not present

## 2023-02-01 DIAGNOSIS — Z992 Dependence on renal dialysis: Secondary | ICD-10-CM | POA: Diagnosis not present

## 2023-02-01 DIAGNOSIS — D509 Iron deficiency anemia, unspecified: Secondary | ICD-10-CM | POA: Diagnosis not present

## 2023-02-01 DIAGNOSIS — D689 Coagulation defect, unspecified: Secondary | ICD-10-CM | POA: Diagnosis not present

## 2023-02-01 DIAGNOSIS — D631 Anemia in chronic kidney disease: Secondary | ICD-10-CM | POA: Diagnosis not present

## 2023-02-01 DIAGNOSIS — E876 Hypokalemia: Secondary | ICD-10-CM | POA: Diagnosis not present

## 2023-02-01 DIAGNOSIS — N186 End stage renal disease: Secondary | ICD-10-CM | POA: Diagnosis not present

## 2023-02-04 DIAGNOSIS — D631 Anemia in chronic kidney disease: Secondary | ICD-10-CM | POA: Diagnosis not present

## 2023-02-04 DIAGNOSIS — N186 End stage renal disease: Secondary | ICD-10-CM | POA: Diagnosis not present

## 2023-02-04 DIAGNOSIS — Z992 Dependence on renal dialysis: Secondary | ICD-10-CM | POA: Diagnosis not present

## 2023-02-04 DIAGNOSIS — D509 Iron deficiency anemia, unspecified: Secondary | ICD-10-CM | POA: Diagnosis not present

## 2023-02-04 DIAGNOSIS — E876 Hypokalemia: Secondary | ICD-10-CM | POA: Diagnosis not present

## 2023-02-04 DIAGNOSIS — D689 Coagulation defect, unspecified: Secondary | ICD-10-CM | POA: Diagnosis not present

## 2023-02-05 ENCOUNTER — Ambulatory Visit (INDEPENDENT_AMBULATORY_CARE_PROVIDER_SITE_OTHER): Payer: Medicare Other | Admitting: Podiatry

## 2023-02-05 ENCOUNTER — Encounter: Payer: Self-pay | Admitting: Podiatry

## 2023-02-05 DIAGNOSIS — I999 Unspecified disorder of circulatory system: Secondary | ICD-10-CM | POA: Diagnosis not present

## 2023-02-05 DIAGNOSIS — Z992 Dependence on renal dialysis: Secondary | ICD-10-CM | POA: Diagnosis not present

## 2023-02-05 DIAGNOSIS — B351 Tinea unguium: Secondary | ICD-10-CM | POA: Diagnosis not present

## 2023-02-05 DIAGNOSIS — I119 Hypertensive heart disease without heart failure: Secondary | ICD-10-CM | POA: Diagnosis not present

## 2023-02-05 DIAGNOSIS — R55 Syncope and collapse: Secondary | ICD-10-CM | POA: Diagnosis not present

## 2023-02-05 DIAGNOSIS — M12512 Traumatic arthropathy, left shoulder: Secondary | ICD-10-CM | POA: Diagnosis not present

## 2023-02-05 DIAGNOSIS — M1220 Villonodular synovitis (pigmented), unspecified site: Secondary | ICD-10-CM | POA: Diagnosis not present

## 2023-02-05 DIAGNOSIS — M79675 Pain in left toe(s): Secondary | ICD-10-CM | POA: Diagnosis not present

## 2023-02-05 DIAGNOSIS — E7800 Pure hypercholesterolemia, unspecified: Secondary | ICD-10-CM | POA: Diagnosis not present

## 2023-02-05 DIAGNOSIS — N186 End stage renal disease: Secondary | ICD-10-CM

## 2023-02-05 DIAGNOSIS — E559 Vitamin D deficiency, unspecified: Secondary | ICD-10-CM | POA: Diagnosis not present

## 2023-02-05 DIAGNOSIS — E1165 Type 2 diabetes mellitus with hyperglycemia: Secondary | ICD-10-CM | POA: Diagnosis not present

## 2023-02-05 DIAGNOSIS — E1151 Type 2 diabetes mellitus with diabetic peripheral angiopathy without gangrene: Secondary | ICD-10-CM

## 2023-02-05 DIAGNOSIS — M1990 Unspecified osteoarthritis, unspecified site: Secondary | ICD-10-CM | POA: Diagnosis not present

## 2023-02-05 DIAGNOSIS — Z79899 Other long term (current) drug therapy: Secondary | ICD-10-CM | POA: Diagnosis not present

## 2023-02-05 DIAGNOSIS — M79674 Pain in right toe(s): Secondary | ICD-10-CM

## 2023-02-05 DIAGNOSIS — H547 Unspecified visual loss: Secondary | ICD-10-CM | POA: Diagnosis not present

## 2023-02-05 DIAGNOSIS — I6990 Unspecified sequelae of unspecified cerebrovascular disease: Secondary | ICD-10-CM | POA: Diagnosis not present

## 2023-02-05 NOTE — Patient Instructions (Signed)
   For dry or cracked skin, recommend daily use of Bag Balm Hand and Body Moistuizer which may be purchased at local drug store or on [employer].

## 2023-02-05 NOTE — Progress Notes (Signed)
  Subjective:  Patient ID: Shawn [name], male    DOB: Sep 26, 1960,  MRN: [id number]  Shawn [name] presents to clinic today for at risk foot care with h/o NIDDM with ESRD on hemodialysis and painful thick toenails that are difficult to trim. Pain interferes with ambulation. Aggravating factors include wearing enclosed shoe gear. Pain is relieved with periodic professional debridement.  Chief Complaint  Patient presents with   Diabetes    DFC BS - DIDN'T CHECK IT A1C - 6.4 LVPCP - 10/2022, HAS AN APPT TODAY   New problem(s): None.   PCP is Corine Shelter, MD.  No Known Allergies  Review of Systems: Negative except as noted in the HPI.  Objective:  There were no vitals filed for this visit. Shawn [name] is a pleasant 62 y.o. male thin build in NAD. AAO x 3.  Vascular Examination: CFT <4 seconds b/l. DP pulses diminished b/l. PT pulses diminished b/l. Digital hair absent. Skin temperature gradient warm to cool b/l. No ischemia or gangrene. No cyanosis or clubbing noted b/l. No varicosities noted.   Neurological Examination: Protective sensation diminished with 10g monofilament b/l.  Dermatological Examination: Pedal skin thin, shiny and atrophic b/l. No open wounds. No interdigital macerations.   Toenails 1-5 b/l thick, discolored, elongated with subungual debris and pain on dorsal palpation.   No corns, calluses nor porokeratotic lesions noted.  Musculoskeletal Examination: Normal muscle strength 5/5 to all lower extremity muscle groups bilaterally. Hammertoe(s) noted to the bilateral 2nd toes. No pain, crepitus or joint limitation noted with ROM b/l LE.  Patient ambulates independently without assistive aids.  Radiographs: None  Last A1c:      Latest Ref Rng & Units 10/07/2022    7:30 PM  Hemoglobin A1C  Hemoglobin-A1c 4.8 - 5.6 % 6.6    Assessment/Plan: 1. Pain due to onychomycosis of toenails of both feet   2. ESRD on dialysis (HCC)   3.  Type II diabetes mellitus with peripheral circulatory disorder [facility])     Patient was evaluated and treated. All patient's and/or POA's questions/concerns addressed on today's visit. Mycotic toenails 1-5 debrided in length and girth without incident. Continue soft, supportive shoe gear daily. Report any pedal injuries to medical professional. Call office if there are any quesitons/concerns. -Continue foot and shoe inspections daily. Monitor blood glucose per PCP/Endocrinologist's recommendations. -Patient/POA to call should there be question/concern in the interim.   Return in about 10 weeks (around 04/16/2023).  Freddie Breech, DPM

## 2023-02-06 DIAGNOSIS — D509 Iron deficiency anemia, unspecified: Secondary | ICD-10-CM | POA: Diagnosis not present

## 2023-02-06 DIAGNOSIS — N186 End stage renal disease: Secondary | ICD-10-CM | POA: Diagnosis not present

## 2023-02-06 DIAGNOSIS — D689 Coagulation defect, unspecified: Secondary | ICD-10-CM | POA: Diagnosis not present

## 2023-02-06 DIAGNOSIS — D631 Anemia in chronic kidney disease: Secondary | ICD-10-CM | POA: Diagnosis not present

## 2023-02-06 DIAGNOSIS — E876 Hypokalemia: Secondary | ICD-10-CM | POA: Diagnosis not present

## 2023-02-06 DIAGNOSIS — Z992 Dependence on renal dialysis: Secondary | ICD-10-CM | POA: Diagnosis not present

## 2023-02-07 DIAGNOSIS — N186 End stage renal disease: Secondary | ICD-10-CM | POA: Diagnosis not present

## 2023-02-07 DIAGNOSIS — Z992 Dependence on renal dialysis: Secondary | ICD-10-CM | POA: Diagnosis not present

## 2023-02-07 DIAGNOSIS — I120 Hypertensive chronic kidney disease with stage 5 chronic kidney disease or end stage renal disease: Secondary | ICD-10-CM | POA: Diagnosis not present

## 2023-02-08 DIAGNOSIS — J189 Pneumonia, unspecified organism: Secondary | ICD-10-CM | POA: Diagnosis not present

## 2023-02-08 DIAGNOSIS — I5022 Chronic systolic (congestive) heart failure: Secondary | ICD-10-CM | POA: Diagnosis not present

## 2023-02-08 DIAGNOSIS — N2581 Secondary hyperparathyroidism of renal origin: Secondary | ICD-10-CM | POA: Diagnosis not present

## 2023-02-08 DIAGNOSIS — E876 Hypokalemia: Secondary | ICD-10-CM | POA: Diagnosis not present

## 2023-02-08 DIAGNOSIS — D509 Iron deficiency anemia, unspecified: Secondary | ICD-10-CM | POA: Diagnosis not present

## 2023-02-08 DIAGNOSIS — Z992 Dependence on renal dialysis: Secondary | ICD-10-CM | POA: Diagnosis not present

## 2023-02-08 DIAGNOSIS — I130 Hypertensive heart and chronic kidney disease with heart failure and stage 1 through stage 4 chronic kidney disease, or unspecified chronic kidney disease: Secondary | ICD-10-CM | POA: Diagnosis not present

## 2023-02-08 DIAGNOSIS — D631 Anemia in chronic kidney disease: Secondary | ICD-10-CM | POA: Diagnosis not present

## 2023-02-08 DIAGNOSIS — E1122 Type 2 diabetes mellitus with diabetic chronic kidney disease: Secondary | ICD-10-CM | POA: Diagnosis not present

## 2023-02-08 DIAGNOSIS — E119 Type 2 diabetes mellitus without complications: Secondary | ICD-10-CM | POA: Diagnosis not present

## 2023-02-08 DIAGNOSIS — N186 End stage renal disease: Secondary | ICD-10-CM | POA: Diagnosis not present

## 2023-02-11 DIAGNOSIS — E876 Hypokalemia: Secondary | ICD-10-CM | POA: Diagnosis not present

## 2023-02-11 DIAGNOSIS — D631 Anemia in chronic kidney disease: Secondary | ICD-10-CM | POA: Diagnosis not present

## 2023-02-11 DIAGNOSIS — N186 End stage renal disease: Secondary | ICD-10-CM | POA: Diagnosis not present

## 2023-02-11 DIAGNOSIS — E119 Type 2 diabetes mellitus without complications: Secondary | ICD-10-CM | POA: Diagnosis not present

## 2023-02-11 DIAGNOSIS — D509 Iron deficiency anemia, unspecified: Secondary | ICD-10-CM | POA: Diagnosis not present

## 2023-02-11 DIAGNOSIS — Z992 Dependence on renal dialysis: Secondary | ICD-10-CM | POA: Diagnosis not present

## 2023-02-13 DIAGNOSIS — E119 Type 2 diabetes mellitus without complications: Secondary | ICD-10-CM | POA: Diagnosis not present

## 2023-02-13 DIAGNOSIS — D631 Anemia in chronic kidney disease: Secondary | ICD-10-CM | POA: Diagnosis not present

## 2023-02-13 DIAGNOSIS — D509 Iron deficiency anemia, unspecified: Secondary | ICD-10-CM | POA: Diagnosis not present

## 2023-02-13 DIAGNOSIS — E876 Hypokalemia: Secondary | ICD-10-CM | POA: Diagnosis not present

## 2023-02-13 DIAGNOSIS — N186 End stage renal disease: Secondary | ICD-10-CM | POA: Diagnosis not present

## 2023-02-13 DIAGNOSIS — Z992 Dependence on renal dialysis: Secondary | ICD-10-CM | POA: Diagnosis not present

## 2023-02-15 DIAGNOSIS — E876 Hypokalemia: Secondary | ICD-10-CM | POA: Diagnosis not present

## 2023-02-15 DIAGNOSIS — D631 Anemia in chronic kidney disease: Secondary | ICD-10-CM | POA: Diagnosis not present

## 2023-02-15 DIAGNOSIS — Z992 Dependence on renal dialysis: Secondary | ICD-10-CM | POA: Diagnosis not present

## 2023-02-15 DIAGNOSIS — E119 Type 2 diabetes mellitus without complications: Secondary | ICD-10-CM | POA: Diagnosis not present

## 2023-02-15 DIAGNOSIS — D509 Iron deficiency anemia, unspecified: Secondary | ICD-10-CM | POA: Diagnosis not present

## 2023-02-15 DIAGNOSIS — N186 End stage renal disease: Secondary | ICD-10-CM | POA: Diagnosis not present

## 2023-02-18 DIAGNOSIS — D631 Anemia in chronic kidney disease: Secondary | ICD-10-CM | POA: Diagnosis not present

## 2023-02-18 DIAGNOSIS — D509 Iron deficiency anemia, unspecified: Secondary | ICD-10-CM | POA: Diagnosis not present

## 2023-02-18 DIAGNOSIS — E11319 Type 2 diabetes mellitus with unspecified diabetic retinopathy without macular edema: Secondary | ICD-10-CM | POA: Diagnosis not present

## 2023-02-18 DIAGNOSIS — H548 Legal blindness, as defined in USA: Secondary | ICD-10-CM | POA: Diagnosis not present

## 2023-02-18 DIAGNOSIS — Z992 Dependence on renal dialysis: Secondary | ICD-10-CM | POA: Diagnosis not present

## 2023-02-18 DIAGNOSIS — I6930 Unspecified sequelae of cerebral infarction: Secondary | ICD-10-CM | POA: Diagnosis not present

## 2023-02-18 DIAGNOSIS — Z79899 Other long term (current) drug therapy: Secondary | ICD-10-CM | POA: Diagnosis not present

## 2023-02-18 DIAGNOSIS — E119 Type 2 diabetes mellitus without complications: Secondary | ICD-10-CM | POA: Diagnosis not present

## 2023-02-18 DIAGNOSIS — E876 Hypokalemia: Secondary | ICD-10-CM | POA: Diagnosis not present

## 2023-02-18 DIAGNOSIS — N186 End stage renal disease: Secondary | ICD-10-CM | POA: Diagnosis not present

## 2023-02-20 DIAGNOSIS — E876 Hypokalemia: Secondary | ICD-10-CM | POA: Diagnosis not present

## 2023-02-20 DIAGNOSIS — D631 Anemia in chronic kidney disease: Secondary | ICD-10-CM | POA: Diagnosis not present

## 2023-02-20 DIAGNOSIS — N186 End stage renal disease: Secondary | ICD-10-CM | POA: Diagnosis not present

## 2023-02-20 DIAGNOSIS — E119 Type 2 diabetes mellitus without complications: Secondary | ICD-10-CM | POA: Diagnosis not present

## 2023-02-20 DIAGNOSIS — D509 Iron deficiency anemia, unspecified: Secondary | ICD-10-CM | POA: Diagnosis not present

## 2023-02-20 DIAGNOSIS — Z992 Dependence on renal dialysis: Secondary | ICD-10-CM | POA: Diagnosis not present

## 2023-02-21 DIAGNOSIS — I130 Hypertensive heart and chronic kidney disease with heart failure and stage 1 through stage 4 chronic kidney disease, or unspecified chronic kidney disease: Secondary | ICD-10-CM | POA: Diagnosis not present

## 2023-02-21 DIAGNOSIS — J189 Pneumonia, unspecified organism: Secondary | ICD-10-CM | POA: Diagnosis not present

## 2023-02-21 DIAGNOSIS — I5022 Chronic systolic (congestive) heart failure: Secondary | ICD-10-CM | POA: Diagnosis not present

## 2023-02-21 DIAGNOSIS — D631 Anemia in chronic kidney disease: Secondary | ICD-10-CM | POA: Diagnosis not present

## 2023-02-21 DIAGNOSIS — E1122 Type 2 diabetes mellitus with diabetic chronic kidney disease: Secondary | ICD-10-CM | POA: Diagnosis not present

## 2023-02-21 DIAGNOSIS — N186 End stage renal disease: Secondary | ICD-10-CM | POA: Diagnosis not present

## 2023-02-22 DIAGNOSIS — Z992 Dependence on renal dialysis: Secondary | ICD-10-CM | POA: Diagnosis not present

## 2023-02-22 DIAGNOSIS — J189 Pneumonia, unspecified organism: Secondary | ICD-10-CM | POA: Diagnosis not present

## 2023-02-22 DIAGNOSIS — I5022 Chronic systolic (congestive) heart failure: Secondary | ICD-10-CM | POA: Diagnosis not present

## 2023-02-22 DIAGNOSIS — E1122 Type 2 diabetes mellitus with diabetic chronic kidney disease: Secondary | ICD-10-CM | POA: Diagnosis not present

## 2023-02-22 DIAGNOSIS — I130 Hypertensive heart and chronic kidney disease with heart failure and stage 1 through stage 4 chronic kidney disease, or unspecified chronic kidney disease: Secondary | ICD-10-CM | POA: Diagnosis not present

## 2023-02-22 DIAGNOSIS — N186 End stage renal disease: Secondary | ICD-10-CM | POA: Diagnosis not present

## 2023-02-22 DIAGNOSIS — E876 Hypokalemia: Secondary | ICD-10-CM | POA: Diagnosis not present

## 2023-02-22 DIAGNOSIS — D509 Iron deficiency anemia, unspecified: Secondary | ICD-10-CM | POA: Diagnosis not present

## 2023-02-22 DIAGNOSIS — E119 Type 2 diabetes mellitus without complications: Secondary | ICD-10-CM | POA: Diagnosis not present

## 2023-02-22 DIAGNOSIS — D631 Anemia in chronic kidney disease: Secondary | ICD-10-CM | POA: Diagnosis not present

## 2023-02-25 DIAGNOSIS — E876 Hypokalemia: Secondary | ICD-10-CM | POA: Diagnosis not present

## 2023-02-25 DIAGNOSIS — N186 End stage renal disease: Secondary | ICD-10-CM | POA: Diagnosis not present

## 2023-02-25 DIAGNOSIS — D631 Anemia in chronic kidney disease: Secondary | ICD-10-CM | POA: Diagnosis not present

## 2023-02-25 DIAGNOSIS — Z992 Dependence on renal dialysis: Secondary | ICD-10-CM | POA: Diagnosis not present

## 2023-02-25 DIAGNOSIS — E119 Type 2 diabetes mellitus without complications: Secondary | ICD-10-CM | POA: Diagnosis not present

## 2023-02-25 DIAGNOSIS — D509 Iron deficiency anemia, unspecified: Secondary | ICD-10-CM | POA: Diagnosis not present

## 2023-02-27 DIAGNOSIS — I5022 Chronic systolic (congestive) heart failure: Secondary | ICD-10-CM | POA: Diagnosis not present

## 2023-02-27 DIAGNOSIS — E1129 Type 2 diabetes mellitus with other diabetic kidney complication: Secondary | ICD-10-CM | POA: Diagnosis not present

## 2023-02-27 DIAGNOSIS — Z992 Dependence on renal dialysis: Secondary | ICD-10-CM | POA: Diagnosis not present

## 2023-02-27 DIAGNOSIS — D509 Iron deficiency anemia, unspecified: Secondary | ICD-10-CM | POA: Diagnosis not present

## 2023-02-27 DIAGNOSIS — E876 Hypokalemia: Secondary | ICD-10-CM | POA: Diagnosis not present

## 2023-02-27 DIAGNOSIS — E1122 Type 2 diabetes mellitus with diabetic chronic kidney disease: Secondary | ICD-10-CM | POA: Diagnosis not present

## 2023-02-27 DIAGNOSIS — N186 End stage renal disease: Secondary | ICD-10-CM | POA: Diagnosis not present

## 2023-02-27 DIAGNOSIS — I130 Hypertensive heart and chronic kidney disease with heart failure and stage 1 through stage 4 chronic kidney disease, or unspecified chronic kidney disease: Secondary | ICD-10-CM | POA: Diagnosis not present

## 2023-02-27 DIAGNOSIS — E119 Type 2 diabetes mellitus without complications: Secondary | ICD-10-CM | POA: Diagnosis not present

## 2023-02-27 DIAGNOSIS — J189 Pneumonia, unspecified organism: Secondary | ICD-10-CM | POA: Diagnosis not present

## 2023-02-27 DIAGNOSIS — D631 Anemia in chronic kidney disease: Secondary | ICD-10-CM | POA: Diagnosis not present

## 2023-03-01 DIAGNOSIS — D631 Anemia in chronic kidney disease: Secondary | ICD-10-CM | POA: Diagnosis not present

## 2023-03-01 DIAGNOSIS — D509 Iron deficiency anemia, unspecified: Secondary | ICD-10-CM | POA: Diagnosis not present

## 2023-03-01 DIAGNOSIS — E876 Hypokalemia: Secondary | ICD-10-CM | POA: Diagnosis not present

## 2023-03-01 DIAGNOSIS — Z992 Dependence on renal dialysis: Secondary | ICD-10-CM | POA: Diagnosis not present

## 2023-03-01 DIAGNOSIS — N186 End stage renal disease: Secondary | ICD-10-CM | POA: Diagnosis not present

## 2023-03-01 DIAGNOSIS — E119 Type 2 diabetes mellitus without complications: Secondary | ICD-10-CM | POA: Diagnosis not present

## 2023-03-04 DIAGNOSIS — D631 Anemia in chronic kidney disease: Secondary | ICD-10-CM | POA: Diagnosis not present

## 2023-03-04 DIAGNOSIS — N186 End stage renal disease: Secondary | ICD-10-CM | POA: Diagnosis not present

## 2023-03-04 DIAGNOSIS — E876 Hypokalemia: Secondary | ICD-10-CM | POA: Diagnosis not present

## 2023-03-04 DIAGNOSIS — E119 Type 2 diabetes mellitus without complications: Secondary | ICD-10-CM | POA: Diagnosis not present

## 2023-03-04 DIAGNOSIS — D509 Iron deficiency anemia, unspecified: Secondary | ICD-10-CM | POA: Diagnosis not present

## 2023-03-04 DIAGNOSIS — Z992 Dependence on renal dialysis: Secondary | ICD-10-CM | POA: Diagnosis not present

## 2023-03-06 DIAGNOSIS — E876 Hypokalemia: Secondary | ICD-10-CM | POA: Diagnosis not present

## 2023-03-06 DIAGNOSIS — N186 End stage renal disease: Secondary | ICD-10-CM | POA: Diagnosis not present

## 2023-03-06 DIAGNOSIS — D631 Anemia in chronic kidney disease: Secondary | ICD-10-CM | POA: Diagnosis not present

## 2023-03-06 DIAGNOSIS — E119 Type 2 diabetes mellitus without complications: Secondary | ICD-10-CM | POA: Diagnosis not present

## 2023-03-06 DIAGNOSIS — Z992 Dependence on renal dialysis: Secondary | ICD-10-CM | POA: Diagnosis not present

## 2023-03-06 DIAGNOSIS — D509 Iron deficiency anemia, unspecified: Secondary | ICD-10-CM | POA: Diagnosis not present

## 2023-03-08 DIAGNOSIS — E119 Type 2 diabetes mellitus without complications: Secondary | ICD-10-CM | POA: Diagnosis not present

## 2023-03-08 DIAGNOSIS — D509 Iron deficiency anemia, unspecified: Secondary | ICD-10-CM | POA: Diagnosis not present

## 2023-03-08 DIAGNOSIS — D631 Anemia in chronic kidney disease: Secondary | ICD-10-CM | POA: Diagnosis not present

## 2023-03-08 DIAGNOSIS — N186 End stage renal disease: Secondary | ICD-10-CM | POA: Diagnosis not present

## 2023-03-08 DIAGNOSIS — E876 Hypokalemia: Secondary | ICD-10-CM | POA: Diagnosis not present

## 2023-03-08 DIAGNOSIS — Z992 Dependence on renal dialysis: Secondary | ICD-10-CM | POA: Diagnosis not present

## 2023-03-10 DIAGNOSIS — I120 Hypertensive chronic kidney disease with stage 5 chronic kidney disease or end stage renal disease: Secondary | ICD-10-CM | POA: Diagnosis not present

## 2023-03-10 DIAGNOSIS — N186 End stage renal disease: Secondary | ICD-10-CM | POA: Diagnosis not present

## 2023-03-10 DIAGNOSIS — Z992 Dependence on renal dialysis: Secondary | ICD-10-CM | POA: Diagnosis not present

## 2023-03-11 DIAGNOSIS — D509 Iron deficiency anemia, unspecified: Secondary | ICD-10-CM | POA: Diagnosis not present

## 2023-03-11 DIAGNOSIS — N186 End stage renal disease: Secondary | ICD-10-CM | POA: Diagnosis not present

## 2023-03-11 DIAGNOSIS — Z992 Dependence on renal dialysis: Secondary | ICD-10-CM | POA: Diagnosis not present

## 2023-03-11 DIAGNOSIS — E119 Type 2 diabetes mellitus without complications: Secondary | ICD-10-CM | POA: Diagnosis not present

## 2023-03-11 DIAGNOSIS — E876 Hypokalemia: Secondary | ICD-10-CM | POA: Diagnosis not present

## 2023-03-11 DIAGNOSIS — D631 Anemia in chronic kidney disease: Secondary | ICD-10-CM | POA: Diagnosis not present

## 2023-03-11 DIAGNOSIS — N2581 Secondary hyperparathyroidism of renal origin: Secondary | ICD-10-CM | POA: Diagnosis not present

## 2023-03-11 DIAGNOSIS — D689 Coagulation defect, unspecified: Secondary | ICD-10-CM | POA: Diagnosis not present

## 2023-03-13 DIAGNOSIS — D509 Iron deficiency anemia, unspecified: Secondary | ICD-10-CM | POA: Diagnosis not present

## 2023-03-13 DIAGNOSIS — N186 End stage renal disease: Secondary | ICD-10-CM | POA: Diagnosis not present

## 2023-03-13 DIAGNOSIS — D689 Coagulation defect, unspecified: Secondary | ICD-10-CM | POA: Diagnosis not present

## 2023-03-13 DIAGNOSIS — Z992 Dependence on renal dialysis: Secondary | ICD-10-CM | POA: Diagnosis not present

## 2023-03-13 DIAGNOSIS — D631 Anemia in chronic kidney disease: Secondary | ICD-10-CM | POA: Diagnosis not present

## 2023-03-13 DIAGNOSIS — E876 Hypokalemia: Secondary | ICD-10-CM | POA: Diagnosis not present

## 2023-03-15 DIAGNOSIS — D631 Anemia in chronic kidney disease: Secondary | ICD-10-CM | POA: Diagnosis not present

## 2023-03-15 DIAGNOSIS — N186 End stage renal disease: Secondary | ICD-10-CM | POA: Diagnosis not present

## 2023-03-15 DIAGNOSIS — E876 Hypokalemia: Secondary | ICD-10-CM | POA: Diagnosis not present

## 2023-03-15 DIAGNOSIS — Z992 Dependence on renal dialysis: Secondary | ICD-10-CM | POA: Diagnosis not present

## 2023-03-15 DIAGNOSIS — D689 Coagulation defect, unspecified: Secondary | ICD-10-CM | POA: Diagnosis not present

## 2023-03-15 DIAGNOSIS — D509 Iron deficiency anemia, unspecified: Secondary | ICD-10-CM | POA: Diagnosis not present

## 2023-03-18 DIAGNOSIS — E876 Hypokalemia: Secondary | ICD-10-CM | POA: Diagnosis not present

## 2023-03-18 DIAGNOSIS — D689 Coagulation defect, unspecified: Secondary | ICD-10-CM | POA: Diagnosis not present

## 2023-03-18 DIAGNOSIS — N186 End stage renal disease: Secondary | ICD-10-CM | POA: Diagnosis not present

## 2023-03-18 DIAGNOSIS — D509 Iron deficiency anemia, unspecified: Secondary | ICD-10-CM | POA: Diagnosis not present

## 2023-03-18 DIAGNOSIS — Z992 Dependence on renal dialysis: Secondary | ICD-10-CM | POA: Diagnosis not present

## 2023-03-18 DIAGNOSIS — D631 Anemia in chronic kidney disease: Secondary | ICD-10-CM | POA: Diagnosis not present

## 2023-03-20 DIAGNOSIS — E876 Hypokalemia: Secondary | ICD-10-CM | POA: Diagnosis not present

## 2023-03-20 DIAGNOSIS — D689 Coagulation defect, unspecified: Secondary | ICD-10-CM | POA: Diagnosis not present

## 2023-03-20 DIAGNOSIS — Z992 Dependence on renal dialysis: Secondary | ICD-10-CM | POA: Diagnosis not present

## 2023-03-20 DIAGNOSIS — D509 Iron deficiency anemia, unspecified: Secondary | ICD-10-CM | POA: Diagnosis not present

## 2023-03-20 DIAGNOSIS — D631 Anemia in chronic kidney disease: Secondary | ICD-10-CM | POA: Diagnosis not present

## 2023-03-20 DIAGNOSIS — N186 End stage renal disease: Secondary | ICD-10-CM | POA: Diagnosis not present

## 2023-03-22 DIAGNOSIS — D509 Iron deficiency anemia, unspecified: Secondary | ICD-10-CM | POA: Diagnosis not present

## 2023-03-22 DIAGNOSIS — D689 Coagulation defect, unspecified: Secondary | ICD-10-CM | POA: Diagnosis not present

## 2023-03-22 DIAGNOSIS — E876 Hypokalemia: Secondary | ICD-10-CM | POA: Diagnosis not present

## 2023-03-22 DIAGNOSIS — D631 Anemia in chronic kidney disease: Secondary | ICD-10-CM | POA: Diagnosis not present

## 2023-03-22 DIAGNOSIS — N186 End stage renal disease: Secondary | ICD-10-CM | POA: Diagnosis not present

## 2023-03-22 DIAGNOSIS — Z992 Dependence on renal dialysis: Secondary | ICD-10-CM | POA: Diagnosis not present

## 2023-03-25 DIAGNOSIS — D631 Anemia in chronic kidney disease: Secondary | ICD-10-CM | POA: Diagnosis not present

## 2023-03-25 DIAGNOSIS — E876 Hypokalemia: Secondary | ICD-10-CM | POA: Diagnosis not present

## 2023-03-25 DIAGNOSIS — D509 Iron deficiency anemia, unspecified: Secondary | ICD-10-CM | POA: Diagnosis not present

## 2023-03-25 DIAGNOSIS — D689 Coagulation defect, unspecified: Secondary | ICD-10-CM | POA: Diagnosis not present

## 2023-03-25 DIAGNOSIS — N186 End stage renal disease: Secondary | ICD-10-CM | POA: Diagnosis not present

## 2023-03-25 DIAGNOSIS — Z992 Dependence on renal dialysis: Secondary | ICD-10-CM | POA: Diagnosis not present

## 2023-03-27 DIAGNOSIS — D631 Anemia in chronic kidney disease: Secondary | ICD-10-CM | POA: Diagnosis not present

## 2023-03-27 DIAGNOSIS — N186 End stage renal disease: Secondary | ICD-10-CM | POA: Diagnosis not present

## 2023-03-27 DIAGNOSIS — D509 Iron deficiency anemia, unspecified: Secondary | ICD-10-CM | POA: Diagnosis not present

## 2023-03-27 DIAGNOSIS — Z992 Dependence on renal dialysis: Secondary | ICD-10-CM | POA: Diagnosis not present

## 2023-03-27 DIAGNOSIS — D689 Coagulation defect, unspecified: Secondary | ICD-10-CM | POA: Diagnosis not present

## 2023-03-27 DIAGNOSIS — E876 Hypokalemia: Secondary | ICD-10-CM | POA: Diagnosis not present

## 2023-03-29 DIAGNOSIS — D689 Coagulation defect, unspecified: Secondary | ICD-10-CM | POA: Diagnosis not present

## 2023-03-29 DIAGNOSIS — N186 End stage renal disease: Secondary | ICD-10-CM | POA: Diagnosis not present

## 2023-03-29 DIAGNOSIS — Z992 Dependence on renal dialysis: Secondary | ICD-10-CM | POA: Diagnosis not present

## 2023-03-29 DIAGNOSIS — D631 Anemia in chronic kidney disease: Secondary | ICD-10-CM | POA: Diagnosis not present

## 2023-03-29 DIAGNOSIS — D509 Iron deficiency anemia, unspecified: Secondary | ICD-10-CM | POA: Diagnosis not present

## 2023-03-29 DIAGNOSIS — E876 Hypokalemia: Secondary | ICD-10-CM | POA: Diagnosis not present

## 2023-04-01 DIAGNOSIS — N186 End stage renal disease: Secondary | ICD-10-CM | POA: Diagnosis not present

## 2023-04-01 DIAGNOSIS — Z992 Dependence on renal dialysis: Secondary | ICD-10-CM | POA: Diagnosis not present

## 2023-04-01 DIAGNOSIS — D509 Iron deficiency anemia, unspecified: Secondary | ICD-10-CM | POA: Diagnosis not present

## 2023-04-01 DIAGNOSIS — E876 Hypokalemia: Secondary | ICD-10-CM | POA: Diagnosis not present

## 2023-04-01 DIAGNOSIS — D689 Coagulation defect, unspecified: Secondary | ICD-10-CM | POA: Diagnosis not present

## 2023-04-01 DIAGNOSIS — D631 Anemia in chronic kidney disease: Secondary | ICD-10-CM | POA: Diagnosis not present

## 2023-04-03 DIAGNOSIS — E876 Hypokalemia: Secondary | ICD-10-CM | POA: Diagnosis not present

## 2023-04-03 DIAGNOSIS — D689 Coagulation defect, unspecified: Secondary | ICD-10-CM | POA: Diagnosis not present

## 2023-04-03 DIAGNOSIS — N186 End stage renal disease: Secondary | ICD-10-CM | POA: Diagnosis not present

## 2023-04-03 DIAGNOSIS — Z992 Dependence on renal dialysis: Secondary | ICD-10-CM | POA: Diagnosis not present

## 2023-04-03 DIAGNOSIS — D509 Iron deficiency anemia, unspecified: Secondary | ICD-10-CM | POA: Diagnosis not present

## 2023-04-03 DIAGNOSIS — D631 Anemia in chronic kidney disease: Secondary | ICD-10-CM | POA: Diagnosis not present

## 2023-04-05 DIAGNOSIS — D631 Anemia in chronic kidney disease: Secondary | ICD-10-CM | POA: Diagnosis not present

## 2023-04-05 DIAGNOSIS — E876 Hypokalemia: Secondary | ICD-10-CM | POA: Diagnosis not present

## 2023-04-05 DIAGNOSIS — N186 End stage renal disease: Secondary | ICD-10-CM | POA: Diagnosis not present

## 2023-04-05 DIAGNOSIS — Z992 Dependence on renal dialysis: Secondary | ICD-10-CM | POA: Diagnosis not present

## 2023-04-05 DIAGNOSIS — D689 Coagulation defect, unspecified: Secondary | ICD-10-CM | POA: Diagnosis not present

## 2023-04-05 DIAGNOSIS — D509 Iron deficiency anemia, unspecified: Secondary | ICD-10-CM | POA: Diagnosis not present

## 2023-04-08 DIAGNOSIS — D509 Iron deficiency anemia, unspecified: Secondary | ICD-10-CM | POA: Diagnosis not present

## 2023-04-08 DIAGNOSIS — Z992 Dependence on renal dialysis: Secondary | ICD-10-CM | POA: Diagnosis not present

## 2023-04-08 DIAGNOSIS — E876 Hypokalemia: Secondary | ICD-10-CM | POA: Diagnosis not present

## 2023-04-08 DIAGNOSIS — D631 Anemia in chronic kidney disease: Secondary | ICD-10-CM | POA: Diagnosis not present

## 2023-04-08 DIAGNOSIS — D689 Coagulation defect, unspecified: Secondary | ICD-10-CM | POA: Diagnosis not present

## 2023-04-08 DIAGNOSIS — N186 End stage renal disease: Secondary | ICD-10-CM | POA: Diagnosis not present

## 2023-04-09 DIAGNOSIS — I120 Hypertensive chronic kidney disease with stage 5 chronic kidney disease or end stage renal disease: Secondary | ICD-10-CM | POA: Diagnosis not present

## 2023-04-09 DIAGNOSIS — N186 End stage renal disease: Secondary | ICD-10-CM | POA: Diagnosis not present

## 2023-04-09 DIAGNOSIS — Z992 Dependence on renal dialysis: Secondary | ICD-10-CM | POA: Diagnosis not present

## 2023-04-10 DIAGNOSIS — Z992 Dependence on renal dialysis: Secondary | ICD-10-CM | POA: Diagnosis not present

## 2023-04-10 DIAGNOSIS — E876 Hypokalemia: Secondary | ICD-10-CM | POA: Diagnosis not present

## 2023-04-10 DIAGNOSIS — Z23 Encounter for immunization: Secondary | ICD-10-CM | POA: Diagnosis not present

## 2023-04-10 DIAGNOSIS — E119 Type 2 diabetes mellitus without complications: Secondary | ICD-10-CM | POA: Diagnosis not present

## 2023-04-10 DIAGNOSIS — N186 End stage renal disease: Secondary | ICD-10-CM | POA: Diagnosis not present

## 2023-04-10 DIAGNOSIS — D631 Anemia in chronic kidney disease: Secondary | ICD-10-CM | POA: Diagnosis not present

## 2023-04-10 DIAGNOSIS — N2581 Secondary hyperparathyroidism of renal origin: Secondary | ICD-10-CM | POA: Diagnosis not present

## 2023-04-10 DIAGNOSIS — D509 Iron deficiency anemia, unspecified: Secondary | ICD-10-CM | POA: Diagnosis not present

## 2023-04-12 DIAGNOSIS — E876 Hypokalemia: Secondary | ICD-10-CM | POA: Diagnosis not present

## 2023-04-12 DIAGNOSIS — D509 Iron deficiency anemia, unspecified: Secondary | ICD-10-CM | POA: Diagnosis not present

## 2023-04-12 DIAGNOSIS — D631 Anemia in chronic kidney disease: Secondary | ICD-10-CM | POA: Diagnosis not present

## 2023-04-12 DIAGNOSIS — Z992 Dependence on renal dialysis: Secondary | ICD-10-CM | POA: Diagnosis not present

## 2023-04-12 DIAGNOSIS — N186 End stage renal disease: Secondary | ICD-10-CM | POA: Diagnosis not present

## 2023-04-12 DIAGNOSIS — E119 Type 2 diabetes mellitus without complications: Secondary | ICD-10-CM | POA: Diagnosis not present

## 2023-04-15 DIAGNOSIS — E876 Hypokalemia: Secondary | ICD-10-CM | POA: Diagnosis not present

## 2023-04-15 DIAGNOSIS — N186 End stage renal disease: Secondary | ICD-10-CM | POA: Diagnosis not present

## 2023-04-15 DIAGNOSIS — D509 Iron deficiency anemia, unspecified: Secondary | ICD-10-CM | POA: Diagnosis not present

## 2023-04-15 DIAGNOSIS — E119 Type 2 diabetes mellitus without complications: Secondary | ICD-10-CM | POA: Diagnosis not present

## 2023-04-15 DIAGNOSIS — D631 Anemia in chronic kidney disease: Secondary | ICD-10-CM | POA: Diagnosis not present

## 2023-04-15 DIAGNOSIS — Z992 Dependence on renal dialysis: Secondary | ICD-10-CM | POA: Diagnosis not present

## 2023-04-16 ENCOUNTER — Ambulatory Visit: Payer: Medicare Other | Admitting: Podiatry

## 2023-04-16 DIAGNOSIS — M79674 Pain in right toe(s): Secondary | ICD-10-CM

## 2023-04-16 DIAGNOSIS — N186 End stage renal disease: Secondary | ICD-10-CM

## 2023-04-16 DIAGNOSIS — M79675 Pain in left toe(s): Secondary | ICD-10-CM | POA: Diagnosis not present

## 2023-04-16 DIAGNOSIS — E1151 Type 2 diabetes mellitus with diabetic peripheral angiopathy without gangrene: Secondary | ICD-10-CM

## 2023-04-16 DIAGNOSIS — Z992 Dependence on renal dialysis: Secondary | ICD-10-CM

## 2023-04-16 DIAGNOSIS — B351 Tinea unguium: Secondary | ICD-10-CM | POA: Diagnosis not present

## 2023-04-16 NOTE — Progress Notes (Signed)
Subjective:  Patient ID: Shawn [name], male    DOB: 08-Jan-1961,  MRN: [id number]  62 y.o. male presents at risk foot care with h/o NIDDM with ESRD on hemodialysis and painful, discolored, thick toenails which interfere with daily activities  Chief Complaint  Patient presents with   Diabetes    DFC BS-132 A1C -6.7 PCPV-12/2022   New problem(s): None   PCP is Corine Shelter, MD , and last visit was 02/18/2023.  No Known Allergies  Review of Systems: Negative except as noted in the HPI.   Objective:  [name] is a pleasant 62 y.o. male WD, WN in NAD.Marland Kitchen AAO x 3.  Vascular Examination: Vascular status intact b/l with nonpalpable pedal pulses. CFT <4 seconds b/l. Pedal hair absent. No edema. No pain with calf compression b/l. Skin temperature gradient WNL b/l. No varicosities noted. No cyanosis or clubbing noted.  Neurological Examination: Pedal sensation diminished b/l.   Dermatological Examination: Pedal skin thin and atrophic b/l. No open wounds nor interdigital macerations noted.   Toenails 1-5 b/l thick, discolored, elongated with subungual debris and pain on dorsal palpation.   No hyperkeratotic lesions noted b/l.   Musculoskeletal Examination: Muscle strength 5/5 to b/l LE.  No pain, crepitus noted b/l. Muscle strength 5/5 to all lower extremity muscle groups bilaterally. Hammertoe(s) noted to the bilateral 2nd toes.   Radiographs: None  Last A1c:      Latest Ref Rng & Units 10/07/2022    7:30 PM  Hemoglobin A1C  Hemoglobin-A1c 4.8 - 5.6 % 6.6      Assessment:   1. Pain due to onychomycosis of toenails of both feet   2. ESRD on dialysis (HCC)   3. Type II diabetes mellitus with peripheral circulatory disorder [facility])    Plan:  -Patient's family member present. All questions/concerns addressed on today's visit. -Continue foot and shoe inspections daily. Monitor blood glucose per PCP/Endocrinologist's recommendations. -Continue supportive  shoe gear daily. -Mycotic toenails 1-5 bilaterally were debrided in length and girth with sterile nail nippers and dremel without incident. -Patient/POA to call should there be question/concern in the interim.  Return in about 9 weeks (around 06/18/2023).  Freddie Breech, DPM

## 2023-04-17 DIAGNOSIS — N186 End stage renal disease: Secondary | ICD-10-CM | POA: Diagnosis not present

## 2023-04-17 DIAGNOSIS — E876 Hypokalemia: Secondary | ICD-10-CM | POA: Diagnosis not present

## 2023-04-17 DIAGNOSIS — D631 Anemia in chronic kidney disease: Secondary | ICD-10-CM | POA: Diagnosis not present

## 2023-04-17 DIAGNOSIS — E119 Type 2 diabetes mellitus without complications: Secondary | ICD-10-CM | POA: Diagnosis not present

## 2023-04-17 DIAGNOSIS — D509 Iron deficiency anemia, unspecified: Secondary | ICD-10-CM | POA: Diagnosis not present

## 2023-04-17 DIAGNOSIS — Z992 Dependence on renal dialysis: Secondary | ICD-10-CM | POA: Diagnosis not present

## 2023-04-19 DIAGNOSIS — D509 Iron deficiency anemia, unspecified: Secondary | ICD-10-CM | POA: Diagnosis not present

## 2023-04-19 DIAGNOSIS — N186 End stage renal disease: Secondary | ICD-10-CM | POA: Diagnosis not present

## 2023-04-19 DIAGNOSIS — D631 Anemia in chronic kidney disease: Secondary | ICD-10-CM | POA: Diagnosis not present

## 2023-04-19 DIAGNOSIS — Z992 Dependence on renal dialysis: Secondary | ICD-10-CM | POA: Diagnosis not present

## 2023-04-19 DIAGNOSIS — E119 Type 2 diabetes mellitus without complications: Secondary | ICD-10-CM | POA: Diagnosis not present

## 2023-04-19 DIAGNOSIS — E876 Hypokalemia: Secondary | ICD-10-CM | POA: Diagnosis not present

## 2023-04-21 ENCOUNTER — Encounter: Payer: Self-pay | Admitting: Podiatry

## 2023-04-22 DIAGNOSIS — D509 Iron deficiency anemia, unspecified: Secondary | ICD-10-CM | POA: Diagnosis not present

## 2023-04-22 DIAGNOSIS — N186 End stage renal disease: Secondary | ICD-10-CM | POA: Diagnosis not present

## 2023-04-22 DIAGNOSIS — E119 Type 2 diabetes mellitus without complications: Secondary | ICD-10-CM | POA: Diagnosis not present

## 2023-04-22 DIAGNOSIS — D631 Anemia in chronic kidney disease: Secondary | ICD-10-CM | POA: Diagnosis not present

## 2023-04-22 DIAGNOSIS — Z992 Dependence on renal dialysis: Secondary | ICD-10-CM | POA: Diagnosis not present

## 2023-04-22 DIAGNOSIS — E876 Hypokalemia: Secondary | ICD-10-CM | POA: Diagnosis not present

## 2023-04-24 DIAGNOSIS — N186 End stage renal disease: Secondary | ICD-10-CM | POA: Diagnosis not present

## 2023-04-24 DIAGNOSIS — D631 Anemia in chronic kidney disease: Secondary | ICD-10-CM | POA: Diagnosis not present

## 2023-04-24 DIAGNOSIS — E119 Type 2 diabetes mellitus without complications: Secondary | ICD-10-CM | POA: Diagnosis not present

## 2023-04-24 DIAGNOSIS — Z992 Dependence on renal dialysis: Secondary | ICD-10-CM | POA: Diagnosis not present

## 2023-04-24 DIAGNOSIS — E876 Hypokalemia: Secondary | ICD-10-CM | POA: Diagnosis not present

## 2023-04-24 DIAGNOSIS — D509 Iron deficiency anemia, unspecified: Secondary | ICD-10-CM | POA: Diagnosis not present

## 2023-04-26 DIAGNOSIS — E119 Type 2 diabetes mellitus without complications: Secondary | ICD-10-CM | POA: Diagnosis not present

## 2023-04-26 DIAGNOSIS — Z992 Dependence on renal dialysis: Secondary | ICD-10-CM | POA: Diagnosis not present

## 2023-04-26 DIAGNOSIS — E876 Hypokalemia: Secondary | ICD-10-CM | POA: Diagnosis not present

## 2023-04-26 DIAGNOSIS — N186 End stage renal disease: Secondary | ICD-10-CM | POA: Diagnosis not present

## 2023-04-26 DIAGNOSIS — D631 Anemia in chronic kidney disease: Secondary | ICD-10-CM | POA: Diagnosis not present

## 2023-04-26 DIAGNOSIS — D509 Iron deficiency anemia, unspecified: Secondary | ICD-10-CM | POA: Diagnosis not present

## 2023-04-29 DIAGNOSIS — E119 Type 2 diabetes mellitus without complications: Secondary | ICD-10-CM | POA: Diagnosis not present

## 2023-04-29 DIAGNOSIS — D509 Iron deficiency anemia, unspecified: Secondary | ICD-10-CM | POA: Diagnosis not present

## 2023-04-29 DIAGNOSIS — Z992 Dependence on renal dialysis: Secondary | ICD-10-CM | POA: Diagnosis not present

## 2023-04-29 DIAGNOSIS — E876 Hypokalemia: Secondary | ICD-10-CM | POA: Diagnosis not present

## 2023-04-29 DIAGNOSIS — N186 End stage renal disease: Secondary | ICD-10-CM | POA: Diagnosis not present

## 2023-04-29 DIAGNOSIS — D631 Anemia in chronic kidney disease: Secondary | ICD-10-CM | POA: Diagnosis not present

## 2023-05-01 DIAGNOSIS — N186 End stage renal disease: Secondary | ICD-10-CM | POA: Diagnosis not present

## 2023-05-01 DIAGNOSIS — Z992 Dependence on renal dialysis: Secondary | ICD-10-CM | POA: Diagnosis not present

## 2023-05-01 DIAGNOSIS — D631 Anemia in chronic kidney disease: Secondary | ICD-10-CM | POA: Diagnosis not present

## 2023-05-01 DIAGNOSIS — E876 Hypokalemia: Secondary | ICD-10-CM | POA: Diagnosis not present

## 2023-05-01 DIAGNOSIS — D509 Iron deficiency anemia, unspecified: Secondary | ICD-10-CM | POA: Diagnosis not present

## 2023-05-01 DIAGNOSIS — E119 Type 2 diabetes mellitus without complications: Secondary | ICD-10-CM | POA: Diagnosis not present

## 2023-05-03 DIAGNOSIS — D509 Iron deficiency anemia, unspecified: Secondary | ICD-10-CM | POA: Diagnosis not present

## 2023-05-03 DIAGNOSIS — E876 Hypokalemia: Secondary | ICD-10-CM | POA: Diagnosis not present

## 2023-05-03 DIAGNOSIS — D631 Anemia in chronic kidney disease: Secondary | ICD-10-CM | POA: Diagnosis not present

## 2023-05-03 DIAGNOSIS — Z992 Dependence on renal dialysis: Secondary | ICD-10-CM | POA: Diagnosis not present

## 2023-05-03 DIAGNOSIS — E119 Type 2 diabetes mellitus without complications: Secondary | ICD-10-CM | POA: Diagnosis not present

## 2023-05-03 DIAGNOSIS — N186 End stage renal disease: Secondary | ICD-10-CM | POA: Diagnosis not present

## 2023-05-06 DIAGNOSIS — Z992 Dependence on renal dialysis: Secondary | ICD-10-CM | POA: Diagnosis not present

## 2023-05-06 DIAGNOSIS — E119 Type 2 diabetes mellitus without complications: Secondary | ICD-10-CM | POA: Diagnosis not present

## 2023-05-06 DIAGNOSIS — D631 Anemia in chronic kidney disease: Secondary | ICD-10-CM | POA: Diagnosis not present

## 2023-05-06 DIAGNOSIS — E876 Hypokalemia: Secondary | ICD-10-CM | POA: Diagnosis not present

## 2023-05-06 DIAGNOSIS — D509 Iron deficiency anemia, unspecified: Secondary | ICD-10-CM | POA: Diagnosis not present

## 2023-05-06 DIAGNOSIS — N186 End stage renal disease: Secondary | ICD-10-CM | POA: Diagnosis not present

## 2023-05-10 DIAGNOSIS — I120 Hypertensive chronic kidney disease with stage 5 chronic kidney disease or end stage renal disease: Secondary | ICD-10-CM | POA: Diagnosis not present

## 2023-05-10 DIAGNOSIS — Z992 Dependence on renal dialysis: Secondary | ICD-10-CM | POA: Diagnosis not present

## 2023-05-10 DIAGNOSIS — N2581 Secondary hyperparathyroidism of renal origin: Secondary | ICD-10-CM | POA: Diagnosis not present

## 2023-05-10 DIAGNOSIS — E119 Type 2 diabetes mellitus without complications: Secondary | ICD-10-CM | POA: Diagnosis not present

## 2023-05-10 DIAGNOSIS — D509 Iron deficiency anemia, unspecified: Secondary | ICD-10-CM | POA: Diagnosis not present

## 2023-05-10 DIAGNOSIS — E876 Hypokalemia: Secondary | ICD-10-CM | POA: Diagnosis not present

## 2023-05-10 DIAGNOSIS — D631 Anemia in chronic kidney disease: Secondary | ICD-10-CM | POA: Diagnosis not present

## 2023-05-10 DIAGNOSIS — N186 End stage renal disease: Secondary | ICD-10-CM | POA: Diagnosis not present

## 2023-05-13 DIAGNOSIS — N186 End stage renal disease: Secondary | ICD-10-CM | POA: Diagnosis not present

## 2023-05-13 DIAGNOSIS — E119 Type 2 diabetes mellitus without complications: Secondary | ICD-10-CM | POA: Diagnosis not present

## 2023-05-13 DIAGNOSIS — D631 Anemia in chronic kidney disease: Secondary | ICD-10-CM | POA: Diagnosis not present

## 2023-05-13 DIAGNOSIS — Z992 Dependence on renal dialysis: Secondary | ICD-10-CM | POA: Diagnosis not present

## 2023-05-13 DIAGNOSIS — E876 Hypokalemia: Secondary | ICD-10-CM | POA: Diagnosis not present

## 2023-05-13 DIAGNOSIS — N2581 Secondary hyperparathyroidism of renal origin: Secondary | ICD-10-CM | POA: Diagnosis not present

## 2023-05-15 DIAGNOSIS — E876 Hypokalemia: Secondary | ICD-10-CM | POA: Diagnosis not present

## 2023-05-15 DIAGNOSIS — N186 End stage renal disease: Secondary | ICD-10-CM | POA: Diagnosis not present

## 2023-05-15 DIAGNOSIS — N2581 Secondary hyperparathyroidism of renal origin: Secondary | ICD-10-CM | POA: Diagnosis not present

## 2023-05-15 DIAGNOSIS — Z992 Dependence on renal dialysis: Secondary | ICD-10-CM | POA: Diagnosis not present

## 2023-05-15 DIAGNOSIS — E119 Type 2 diabetes mellitus without complications: Secondary | ICD-10-CM | POA: Diagnosis not present

## 2023-05-15 DIAGNOSIS — D631 Anemia in chronic kidney disease: Secondary | ICD-10-CM | POA: Diagnosis not present

## 2023-05-17 DIAGNOSIS — D631 Anemia in chronic kidney disease: Secondary | ICD-10-CM | POA: Diagnosis not present

## 2023-05-17 DIAGNOSIS — Z992 Dependence on renal dialysis: Secondary | ICD-10-CM | POA: Diagnosis not present

## 2023-05-17 DIAGNOSIS — N2581 Secondary hyperparathyroidism of renal origin: Secondary | ICD-10-CM | POA: Diagnosis not present

## 2023-05-17 DIAGNOSIS — N186 End stage renal disease: Secondary | ICD-10-CM | POA: Diagnosis not present

## 2023-05-17 DIAGNOSIS — E876 Hypokalemia: Secondary | ICD-10-CM | POA: Diagnosis not present

## 2023-05-17 DIAGNOSIS — E119 Type 2 diabetes mellitus without complications: Secondary | ICD-10-CM | POA: Diagnosis not present

## 2023-05-20 DIAGNOSIS — N186 End stage renal disease: Secondary | ICD-10-CM | POA: Diagnosis not present

## 2023-05-20 DIAGNOSIS — Z992 Dependence on renal dialysis: Secondary | ICD-10-CM | POA: Diagnosis not present

## 2023-05-20 DIAGNOSIS — E119 Type 2 diabetes mellitus without complications: Secondary | ICD-10-CM | POA: Diagnosis not present

## 2023-05-20 DIAGNOSIS — N2581 Secondary hyperparathyroidism of renal origin: Secondary | ICD-10-CM | POA: Diagnosis not present

## 2023-05-20 DIAGNOSIS — D631 Anemia in chronic kidney disease: Secondary | ICD-10-CM | POA: Diagnosis not present

## 2023-05-20 DIAGNOSIS — E876 Hypokalemia: Secondary | ICD-10-CM | POA: Diagnosis not present

## 2023-05-22 DIAGNOSIS — E119 Type 2 diabetes mellitus without complications: Secondary | ICD-10-CM | POA: Diagnosis not present

## 2023-05-22 DIAGNOSIS — Z992 Dependence on renal dialysis: Secondary | ICD-10-CM | POA: Diagnosis not present

## 2023-05-22 DIAGNOSIS — N2581 Secondary hyperparathyroidism of renal origin: Secondary | ICD-10-CM | POA: Diagnosis not present

## 2023-05-22 DIAGNOSIS — E876 Hypokalemia: Secondary | ICD-10-CM | POA: Diagnosis not present

## 2023-05-22 DIAGNOSIS — D631 Anemia in chronic kidney disease: Secondary | ICD-10-CM | POA: Diagnosis not present

## 2023-05-22 DIAGNOSIS — N186 End stage renal disease: Secondary | ICD-10-CM | POA: Diagnosis not present

## 2023-05-24 DIAGNOSIS — Z992 Dependence on renal dialysis: Secondary | ICD-10-CM | POA: Diagnosis not present

## 2023-05-24 DIAGNOSIS — N2581 Secondary hyperparathyroidism of renal origin: Secondary | ICD-10-CM | POA: Diagnosis not present

## 2023-05-24 DIAGNOSIS — E876 Hypokalemia: Secondary | ICD-10-CM | POA: Diagnosis not present

## 2023-05-24 DIAGNOSIS — E119 Type 2 diabetes mellitus without complications: Secondary | ICD-10-CM | POA: Diagnosis not present

## 2023-05-24 DIAGNOSIS — D631 Anemia in chronic kidney disease: Secondary | ICD-10-CM | POA: Diagnosis not present

## 2023-05-24 DIAGNOSIS — N186 End stage renal disease: Secondary | ICD-10-CM | POA: Diagnosis not present

## 2023-05-27 DIAGNOSIS — N186 End stage renal disease: Secondary | ICD-10-CM | POA: Diagnosis not present

## 2023-05-27 DIAGNOSIS — E119 Type 2 diabetes mellitus without complications: Secondary | ICD-10-CM | POA: Diagnosis not present

## 2023-05-27 DIAGNOSIS — D631 Anemia in chronic kidney disease: Secondary | ICD-10-CM | POA: Diagnosis not present

## 2023-05-27 DIAGNOSIS — Z992 Dependence on renal dialysis: Secondary | ICD-10-CM | POA: Diagnosis not present

## 2023-05-27 DIAGNOSIS — E876 Hypokalemia: Secondary | ICD-10-CM | POA: Diagnosis not present

## 2023-05-27 DIAGNOSIS — N2581 Secondary hyperparathyroidism of renal origin: Secondary | ICD-10-CM | POA: Diagnosis not present

## 2023-05-29 DIAGNOSIS — E876 Hypokalemia: Secondary | ICD-10-CM | POA: Diagnosis not present

## 2023-05-29 DIAGNOSIS — E119 Type 2 diabetes mellitus without complications: Secondary | ICD-10-CM | POA: Diagnosis not present

## 2023-05-29 DIAGNOSIS — Z992 Dependence on renal dialysis: Secondary | ICD-10-CM | POA: Diagnosis not present

## 2023-05-29 DIAGNOSIS — N2581 Secondary hyperparathyroidism of renal origin: Secondary | ICD-10-CM | POA: Diagnosis not present

## 2023-05-29 DIAGNOSIS — E1129 Type 2 diabetes mellitus with other diabetic kidney complication: Secondary | ICD-10-CM | POA: Diagnosis not present

## 2023-05-29 DIAGNOSIS — D631 Anemia in chronic kidney disease: Secondary | ICD-10-CM | POA: Diagnosis not present

## 2023-05-29 DIAGNOSIS — N186 End stage renal disease: Secondary | ICD-10-CM | POA: Diagnosis not present

## 2023-05-31 DIAGNOSIS — E119 Type 2 diabetes mellitus without complications: Secondary | ICD-10-CM | POA: Diagnosis not present

## 2023-05-31 DIAGNOSIS — E876 Hypokalemia: Secondary | ICD-10-CM | POA: Diagnosis not present

## 2023-05-31 DIAGNOSIS — D631 Anemia in chronic kidney disease: Secondary | ICD-10-CM | POA: Diagnosis not present

## 2023-05-31 DIAGNOSIS — N2581 Secondary hyperparathyroidism of renal origin: Secondary | ICD-10-CM | POA: Diagnosis not present

## 2023-05-31 DIAGNOSIS — Z992 Dependence on renal dialysis: Secondary | ICD-10-CM | POA: Diagnosis not present

## 2023-05-31 DIAGNOSIS — N186 End stage renal disease: Secondary | ICD-10-CM | POA: Diagnosis not present

## 2023-06-02 DIAGNOSIS — N186 End stage renal disease: Secondary | ICD-10-CM | POA: Diagnosis not present

## 2023-06-02 DIAGNOSIS — E119 Type 2 diabetes mellitus without complications: Secondary | ICD-10-CM | POA: Diagnosis not present

## 2023-06-02 DIAGNOSIS — E876 Hypokalemia: Secondary | ICD-10-CM | POA: Diagnosis not present

## 2023-06-02 DIAGNOSIS — Z992 Dependence on renal dialysis: Secondary | ICD-10-CM | POA: Diagnosis not present

## 2023-06-02 DIAGNOSIS — D631 Anemia in chronic kidney disease: Secondary | ICD-10-CM | POA: Diagnosis not present

## 2023-06-02 DIAGNOSIS — N2581 Secondary hyperparathyroidism of renal origin: Secondary | ICD-10-CM | POA: Diagnosis not present

## 2023-06-04 DIAGNOSIS — E119 Type 2 diabetes mellitus without complications: Secondary | ICD-10-CM | POA: Diagnosis not present

## 2023-06-04 DIAGNOSIS — E876 Hypokalemia: Secondary | ICD-10-CM | POA: Diagnosis not present

## 2023-06-04 DIAGNOSIS — Z992 Dependence on renal dialysis: Secondary | ICD-10-CM | POA: Diagnosis not present

## 2023-06-04 DIAGNOSIS — N2581 Secondary hyperparathyroidism of renal origin: Secondary | ICD-10-CM | POA: Diagnosis not present

## 2023-06-04 DIAGNOSIS — N186 End stage renal disease: Secondary | ICD-10-CM | POA: Diagnosis not present

## 2023-06-04 DIAGNOSIS — D631 Anemia in chronic kidney disease: Secondary | ICD-10-CM | POA: Diagnosis not present

## 2023-06-07 DIAGNOSIS — E119 Type 2 diabetes mellitus without complications: Secondary | ICD-10-CM | POA: Diagnosis not present

## 2023-06-07 DIAGNOSIS — D631 Anemia in chronic kidney disease: Secondary | ICD-10-CM | POA: Diagnosis not present

## 2023-06-07 DIAGNOSIS — N186 End stage renal disease: Secondary | ICD-10-CM | POA: Diagnosis not present

## 2023-06-07 DIAGNOSIS — N2581 Secondary hyperparathyroidism of renal origin: Secondary | ICD-10-CM | POA: Diagnosis not present

## 2023-06-07 DIAGNOSIS — E876 Hypokalemia: Secondary | ICD-10-CM | POA: Diagnosis not present

## 2023-06-07 DIAGNOSIS — Z992 Dependence on renal dialysis: Secondary | ICD-10-CM | POA: Diagnosis not present

## 2023-06-09 DIAGNOSIS — Z992 Dependence on renal dialysis: Secondary | ICD-10-CM | POA: Diagnosis not present

## 2023-06-09 DIAGNOSIS — N186 End stage renal disease: Secondary | ICD-10-CM | POA: Diagnosis not present

## 2023-06-09 DIAGNOSIS — I120 Hypertensive chronic kidney disease with stage 5 chronic kidney disease or end stage renal disease: Secondary | ICD-10-CM | POA: Diagnosis not present

## 2023-06-10 DIAGNOSIS — D509 Iron deficiency anemia, unspecified: Secondary | ICD-10-CM | POA: Diagnosis not present

## 2023-06-10 DIAGNOSIS — N2581 Secondary hyperparathyroidism of renal origin: Secondary | ICD-10-CM | POA: Diagnosis not present

## 2023-06-10 DIAGNOSIS — D631 Anemia in chronic kidney disease: Secondary | ICD-10-CM | POA: Diagnosis not present

## 2023-06-10 DIAGNOSIS — Z992 Dependence on renal dialysis: Secondary | ICD-10-CM | POA: Diagnosis not present

## 2023-06-10 DIAGNOSIS — E119 Type 2 diabetes mellitus without complications: Secondary | ICD-10-CM | POA: Diagnosis not present

## 2023-06-10 DIAGNOSIS — E876 Hypokalemia: Secondary | ICD-10-CM | POA: Diagnosis not present

## 2023-06-10 DIAGNOSIS — N186 End stage renal disease: Secondary | ICD-10-CM | POA: Diagnosis not present

## 2023-06-12 DIAGNOSIS — D509 Iron deficiency anemia, unspecified: Secondary | ICD-10-CM | POA: Diagnosis not present

## 2023-06-12 DIAGNOSIS — E876 Hypokalemia: Secondary | ICD-10-CM | POA: Diagnosis not present

## 2023-06-12 DIAGNOSIS — N2581 Secondary hyperparathyroidism of renal origin: Secondary | ICD-10-CM | POA: Diagnosis not present

## 2023-06-12 DIAGNOSIS — Z992 Dependence on renal dialysis: Secondary | ICD-10-CM | POA: Diagnosis not present

## 2023-06-12 DIAGNOSIS — N186 End stage renal disease: Secondary | ICD-10-CM | POA: Diagnosis not present

## 2023-06-12 DIAGNOSIS — D631 Anemia in chronic kidney disease: Secondary | ICD-10-CM | POA: Diagnosis not present

## 2023-06-14 DIAGNOSIS — E876 Hypokalemia: Secondary | ICD-10-CM | POA: Diagnosis not present

## 2023-06-14 DIAGNOSIS — N186 End stage renal disease: Secondary | ICD-10-CM | POA: Diagnosis not present

## 2023-06-14 DIAGNOSIS — D509 Iron deficiency anemia, unspecified: Secondary | ICD-10-CM | POA: Diagnosis not present

## 2023-06-14 DIAGNOSIS — N2581 Secondary hyperparathyroidism of renal origin: Secondary | ICD-10-CM | POA: Diagnosis not present

## 2023-06-14 DIAGNOSIS — Z992 Dependence on renal dialysis: Secondary | ICD-10-CM | POA: Diagnosis not present

## 2023-06-14 DIAGNOSIS — D631 Anemia in chronic kidney disease: Secondary | ICD-10-CM | POA: Diagnosis not present

## 2023-06-17 DIAGNOSIS — N186 End stage renal disease: Secondary | ICD-10-CM | POA: Diagnosis not present

## 2023-06-17 DIAGNOSIS — D631 Anemia in chronic kidney disease: Secondary | ICD-10-CM | POA: Diagnosis not present

## 2023-06-17 DIAGNOSIS — D509 Iron deficiency anemia, unspecified: Secondary | ICD-10-CM | POA: Diagnosis not present

## 2023-06-17 DIAGNOSIS — E876 Hypokalemia: Secondary | ICD-10-CM | POA: Diagnosis not present

## 2023-06-17 DIAGNOSIS — N2581 Secondary hyperparathyroidism of renal origin: Secondary | ICD-10-CM | POA: Diagnosis not present

## 2023-06-17 DIAGNOSIS — Z992 Dependence on renal dialysis: Secondary | ICD-10-CM | POA: Diagnosis not present

## 2023-06-18 ENCOUNTER — Ambulatory Visit (INDEPENDENT_AMBULATORY_CARE_PROVIDER_SITE_OTHER): Payer: Medicare Other | Admitting: Podiatry

## 2023-06-18 ENCOUNTER — Encounter: Payer: Self-pay | Admitting: Podiatry

## 2023-06-18 DIAGNOSIS — M79675 Pain in left toe(s): Secondary | ICD-10-CM

## 2023-06-18 DIAGNOSIS — N186 End stage renal disease: Secondary | ICD-10-CM

## 2023-06-18 DIAGNOSIS — Z992 Dependence on renal dialysis: Secondary | ICD-10-CM

## 2023-06-18 DIAGNOSIS — B351 Tinea unguium: Secondary | ICD-10-CM

## 2023-06-18 DIAGNOSIS — M79674 Pain in right toe(s): Secondary | ICD-10-CM | POA: Diagnosis not present

## 2023-06-18 DIAGNOSIS — E1151 Type 2 diabetes mellitus with diabetic peripheral angiopathy without gangrene: Secondary | ICD-10-CM | POA: Diagnosis not present

## 2023-06-19 DIAGNOSIS — D509 Iron deficiency anemia, unspecified: Secondary | ICD-10-CM | POA: Diagnosis not present

## 2023-06-19 DIAGNOSIS — E876 Hypokalemia: Secondary | ICD-10-CM | POA: Diagnosis not present

## 2023-06-19 DIAGNOSIS — N186 End stage renal disease: Secondary | ICD-10-CM | POA: Diagnosis not present

## 2023-06-19 DIAGNOSIS — N2581 Secondary hyperparathyroidism of renal origin: Secondary | ICD-10-CM | POA: Diagnosis not present

## 2023-06-19 DIAGNOSIS — Z992 Dependence on renal dialysis: Secondary | ICD-10-CM | POA: Diagnosis not present

## 2023-06-19 DIAGNOSIS — D631 Anemia in chronic kidney disease: Secondary | ICD-10-CM | POA: Diagnosis not present

## 2023-06-20 DIAGNOSIS — I10 Essential (primary) hypertension: Secondary | ICD-10-CM | POA: Diagnosis not present

## 2023-06-21 DIAGNOSIS — D509 Iron deficiency anemia, unspecified: Secondary | ICD-10-CM | POA: Diagnosis not present

## 2023-06-21 DIAGNOSIS — D631 Anemia in chronic kidney disease: Secondary | ICD-10-CM | POA: Diagnosis not present

## 2023-06-21 DIAGNOSIS — N186 End stage renal disease: Secondary | ICD-10-CM | POA: Diagnosis not present

## 2023-06-21 DIAGNOSIS — E876 Hypokalemia: Secondary | ICD-10-CM | POA: Diagnosis not present

## 2023-06-21 DIAGNOSIS — N2581 Secondary hyperparathyroidism of renal origin: Secondary | ICD-10-CM | POA: Diagnosis not present

## 2023-06-21 DIAGNOSIS — Z992 Dependence on renal dialysis: Secondary | ICD-10-CM | POA: Diagnosis not present

## 2023-06-23 ENCOUNTER — Encounter: Payer: Self-pay | Admitting: Podiatry

## 2023-06-23 NOTE — Progress Notes (Signed)
  Subjective:  Patient ID: Shawn [name], male    DOB: 1960/07/26,  MRN: [id number]  62 y.o. male presents with at risk foot care with h/o NIDDM with ESRD on hemodialysis and painful thick toenails that are difficult to trim. Pain interferes with ambulation. Aggravating factors include wearing enclosed shoe gear. Pain is relieved with periodic professional debridement. Chief Complaint  Patient presents with   Diabetes    Patient states his feet have been good since last visit , patient seen his pcp 4 months ago, patient A1c is 6.7    .    PCP: Corine Shelter, MD.  New problem(s): None.   Review of Systems: Negative except as noted in the HPI.   No Known Allergies  Objective:  There were no vitals filed for this visit. Constitutional Patient is a pleasant 62 y.o. African American male WD, WN in NAD. AAO x 3.  Vascular Capillary fill time to digits <4 seconds.  DP/PT pulse(s) are nonpalpable b/l lower extremities. Pedal hair absent b/l. Lower extremity skin temperature gradient warm to cool b/l. No pain with calf compression b/l. No cyanosis or clubbing noted. No ischemia nor gangrene noted b/l.   Neurologic Protective sensation diminished with 10g monofilament b/l.  Dermatologic Pedal skin is thin, shiny and atrophic b/l.  No open wounds b/l lower extremities. No interdigital macerations b/l lower extremities. Toenails 1-5 b/l elongated, discolored, dystrophic, thickened, crumbly with subungual debris and tenderness to dorsal palpation.   Orthopedic: Normal muscle strength 5/5 to all lower extremity muscle groups bilaterally. Hammertoe(s) noted to the L 2nd toe and R 2nd toe.   Last HgA1c:     Latest Ref Rng & Units 10/07/2022    7:30 PM  Hemoglobin A1C  Hemoglobin-A1c 4.8 - 5.6 % 6.6    Assessment:   1. Pain due to onychomycosis of toenails of both feet   2. ESRD on dialysis (HCC)   3. Type II diabetes mellitus with peripheral circulatory disorder [facility])    Plan:   -Consent given for treatment as described below: -Examined patient. -Continue foot and shoe inspections daily. Monitor blood glucose per PCP/Endocrinologist's recommendations. -Patient to continue soft, supportive shoe gear daily. -Toenails 1-5 b/l were debrided in length and girth with sterile nail nippers and dremel without iatrogenic bleeding.  -Patient/POA to call should there be question/concern in the interim.  Return in about 9 weeks (around 08/20/2023).  Freddie Breech, DPM      [facility] LOCATION: [address], [place] [address]                   Office [phone]   [facility] LOCATION: [address] [place], [place] [address] Office [phone] Freddie Breech, DPM

## 2023-06-24 DIAGNOSIS — E876 Hypokalemia: Secondary | ICD-10-CM | POA: Diagnosis not present

## 2023-06-24 DIAGNOSIS — D509 Iron deficiency anemia, unspecified: Secondary | ICD-10-CM | POA: Diagnosis not present

## 2023-06-24 DIAGNOSIS — N186 End stage renal disease: Secondary | ICD-10-CM | POA: Diagnosis not present

## 2023-06-24 DIAGNOSIS — N2581 Secondary hyperparathyroidism of renal origin: Secondary | ICD-10-CM | POA: Diagnosis not present

## 2023-06-24 DIAGNOSIS — D631 Anemia in chronic kidney disease: Secondary | ICD-10-CM | POA: Diagnosis not present

## 2023-06-24 DIAGNOSIS — Z992 Dependence on renal dialysis: Secondary | ICD-10-CM | POA: Diagnosis not present

## 2023-06-26 DIAGNOSIS — D631 Anemia in chronic kidney disease: Secondary | ICD-10-CM | POA: Diagnosis not present

## 2023-06-26 DIAGNOSIS — N186 End stage renal disease: Secondary | ICD-10-CM | POA: Diagnosis not present

## 2023-06-26 DIAGNOSIS — Z992 Dependence on renal dialysis: Secondary | ICD-10-CM | POA: Diagnosis not present

## 2023-06-26 DIAGNOSIS — E876 Hypokalemia: Secondary | ICD-10-CM | POA: Diagnosis not present

## 2023-06-26 DIAGNOSIS — D509 Iron deficiency anemia, unspecified: Secondary | ICD-10-CM | POA: Diagnosis not present

## 2023-06-26 DIAGNOSIS — N2581 Secondary hyperparathyroidism of renal origin: Secondary | ICD-10-CM | POA: Diagnosis not present

## 2023-06-28 DIAGNOSIS — E876 Hypokalemia: Secondary | ICD-10-CM | POA: Diagnosis not present

## 2023-06-28 DIAGNOSIS — D509 Iron deficiency anemia, unspecified: Secondary | ICD-10-CM | POA: Diagnosis not present

## 2023-06-28 DIAGNOSIS — N2581 Secondary hyperparathyroidism of renal origin: Secondary | ICD-10-CM | POA: Diagnosis not present

## 2023-06-28 DIAGNOSIS — N186 End stage renal disease: Secondary | ICD-10-CM | POA: Diagnosis not present

## 2023-06-28 DIAGNOSIS — Z992 Dependence on renal dialysis: Secondary | ICD-10-CM | POA: Diagnosis not present

## 2023-06-28 DIAGNOSIS — D631 Anemia in chronic kidney disease: Secondary | ICD-10-CM | POA: Diagnosis not present

## 2023-06-30 DIAGNOSIS — E876 Hypokalemia: Secondary | ICD-10-CM | POA: Diagnosis not present

## 2023-06-30 DIAGNOSIS — D631 Anemia in chronic kidney disease: Secondary | ICD-10-CM | POA: Diagnosis not present

## 2023-06-30 DIAGNOSIS — N186 End stage renal disease: Secondary | ICD-10-CM | POA: Diagnosis not present

## 2023-06-30 DIAGNOSIS — D509 Iron deficiency anemia, unspecified: Secondary | ICD-10-CM | POA: Diagnosis not present

## 2023-06-30 DIAGNOSIS — N2581 Secondary hyperparathyroidism of renal origin: Secondary | ICD-10-CM | POA: Diagnosis not present

## 2023-06-30 DIAGNOSIS — Z992 Dependence on renal dialysis: Secondary | ICD-10-CM | POA: Diagnosis not present

## 2023-07-02 DIAGNOSIS — E876 Hypokalemia: Secondary | ICD-10-CM | POA: Diagnosis not present

## 2023-07-02 DIAGNOSIS — N186 End stage renal disease: Secondary | ICD-10-CM | POA: Diagnosis not present

## 2023-07-02 DIAGNOSIS — D509 Iron deficiency anemia, unspecified: Secondary | ICD-10-CM | POA: Diagnosis not present

## 2023-07-02 DIAGNOSIS — N2581 Secondary hyperparathyroidism of renal origin: Secondary | ICD-10-CM | POA: Diagnosis not present

## 2023-07-02 DIAGNOSIS — D631 Anemia in chronic kidney disease: Secondary | ICD-10-CM | POA: Diagnosis not present

## 2023-07-02 DIAGNOSIS — Z992 Dependence on renal dialysis: Secondary | ICD-10-CM | POA: Diagnosis not present

## 2023-07-05 DIAGNOSIS — N2581 Secondary hyperparathyroidism of renal origin: Secondary | ICD-10-CM | POA: Diagnosis not present

## 2023-07-05 DIAGNOSIS — D509 Iron deficiency anemia, unspecified: Secondary | ICD-10-CM | POA: Diagnosis not present

## 2023-07-05 DIAGNOSIS — D631 Anemia in chronic kidney disease: Secondary | ICD-10-CM | POA: Diagnosis not present

## 2023-07-05 DIAGNOSIS — N186 End stage renal disease: Secondary | ICD-10-CM | POA: Diagnosis not present

## 2023-07-05 DIAGNOSIS — E876 Hypokalemia: Secondary | ICD-10-CM | POA: Diagnosis not present

## 2023-07-05 DIAGNOSIS — Z992 Dependence on renal dialysis: Secondary | ICD-10-CM | POA: Diagnosis not present

## 2023-07-07 DIAGNOSIS — N2581 Secondary hyperparathyroidism of renal origin: Secondary | ICD-10-CM | POA: Diagnosis not present

## 2023-07-07 DIAGNOSIS — N186 End stage renal disease: Secondary | ICD-10-CM | POA: Diagnosis not present

## 2023-07-07 DIAGNOSIS — D631 Anemia in chronic kidney disease: Secondary | ICD-10-CM | POA: Diagnosis not present

## 2023-07-07 DIAGNOSIS — E876 Hypokalemia: Secondary | ICD-10-CM | POA: Diagnosis not present

## 2023-07-07 DIAGNOSIS — Z992 Dependence on renal dialysis: Secondary | ICD-10-CM | POA: Diagnosis not present

## 2023-07-07 DIAGNOSIS — D509 Iron deficiency anemia, unspecified: Secondary | ICD-10-CM | POA: Diagnosis not present

## 2023-07-08 ENCOUNTER — Inpatient Hospital Stay (HOSPITAL_COMMUNITY)
Admission: EM | Admit: 2023-07-08 | Discharge: 2023-07-18 | DRG: 429 | Disposition: A | Discharge status: Rehabilitation Facility | Payer: Medicare Other | Attending: Internal Medicine | Admitting: Internal Medicine

## 2023-07-08 ENCOUNTER — Other Ambulatory Visit: Payer: Self-pay

## 2023-07-08 DIAGNOSIS — R011 Cardiac murmur, unspecified: Secondary | ICD-10-CM | POA: Diagnosis present

## 2023-07-08 DIAGNOSIS — I132 Hypertensive heart and chronic kidney disease with heart failure and with stage 5 chronic kidney disease, or end stage renal disease: Secondary | ICD-10-CM | POA: Diagnosis present

## 2023-07-08 DIAGNOSIS — Z794 Long term (current) use of insulin: Secondary | ICD-10-CM

## 2023-07-08 DIAGNOSIS — E11649 Type 2 diabetes mellitus with hypoglycemia without coma: Secondary | ICD-10-CM | POA: Diagnosis not present

## 2023-07-08 DIAGNOSIS — N186 End stage renal disease: Secondary | ICD-10-CM | POA: Diagnosis not present

## 2023-07-08 DIAGNOSIS — Z7982 Long term (current) use of aspirin: Secondary | ICD-10-CM

## 2023-07-08 DIAGNOSIS — K592 Neurogenic bowel, not elsewhere classified: Secondary | ICD-10-CM | POA: Diagnosis not present

## 2023-07-08 DIAGNOSIS — K5901 Slow transit constipation: Secondary | ICD-10-CM | POA: Diagnosis not present

## 2023-07-08 DIAGNOSIS — Z5181 Encounter for therapeutic drug level monitoring: Secondary | ICD-10-CM | POA: Diagnosis not present

## 2023-07-08 DIAGNOSIS — M4800 Spinal stenosis, site unspecified: Secondary | ICD-10-CM

## 2023-07-08 DIAGNOSIS — I5022 Chronic systolic (congestive) heart failure: Secondary | ICD-10-CM | POA: Diagnosis not present

## 2023-07-08 DIAGNOSIS — E871 Hypo-osmolality and hyponatremia: Secondary | ICD-10-CM | POA: Diagnosis not present

## 2023-07-08 DIAGNOSIS — R7881 Bacteremia: Secondary | ICD-10-CM | POA: Diagnosis not present

## 2023-07-08 DIAGNOSIS — M4622 Osteomyelitis of vertebra, cervical region: Principal | ICD-10-CM | POA: Diagnosis present

## 2023-07-08 DIAGNOSIS — N250 Renal osteodystrophy: Secondary | ICD-10-CM | POA: Diagnosis not present

## 2023-07-08 DIAGNOSIS — E1169 Type 2 diabetes mellitus with other specified complication: Secondary | ICD-10-CM | POA: Diagnosis not present

## 2023-07-08 DIAGNOSIS — B957 Other staphylococcus as the cause of diseases classified elsewhere: Secondary | ICD-10-CM | POA: Diagnosis present

## 2023-07-08 DIAGNOSIS — N2581 Secondary hyperparathyroidism of renal origin: Secondary | ICD-10-CM | POA: Diagnosis present

## 2023-07-08 DIAGNOSIS — K5900 Constipation, unspecified: Secondary | ICD-10-CM | POA: Diagnosis present

## 2023-07-08 DIAGNOSIS — Z992 Dependence on renal dialysis: Secondary | ICD-10-CM | POA: Diagnosis not present

## 2023-07-08 DIAGNOSIS — G992 Myelopathy in diseases classified elsewhere: Secondary | ICD-10-CM | POA: Diagnosis present

## 2023-07-08 DIAGNOSIS — J90 Pleural effusion, not elsewhere classified: Secondary | ICD-10-CM | POA: Diagnosis not present

## 2023-07-08 DIAGNOSIS — J9811 Atelectasis: Secondary | ICD-10-CM | POA: Diagnosis not present

## 2023-07-08 DIAGNOSIS — M4852XA Collapsed vertebra, not elsewhere classified, cervical region, initial encounter for fracture: Secondary | ICD-10-CM | POA: Diagnosis not present

## 2023-07-08 DIAGNOSIS — M898X9 Other specified disorders of bone, unspecified site: Secondary | ICD-10-CM | POA: Diagnosis present

## 2023-07-08 DIAGNOSIS — M4712 Other spondylosis with myelopathy, cervical region: Secondary | ICD-10-CM | POA: Diagnosis present

## 2023-07-08 DIAGNOSIS — E785 Hyperlipidemia, unspecified: Secondary | ICD-10-CM | POA: Diagnosis present

## 2023-07-08 DIAGNOSIS — M5412 Radiculopathy, cervical region: Secondary | ICD-10-CM | POA: Diagnosis not present

## 2023-07-08 DIAGNOSIS — D631 Anemia in chronic kidney disease: Secondary | ICD-10-CM | POA: Diagnosis not present

## 2023-07-08 DIAGNOSIS — Z79899 Other long term (current) drug therapy: Secondary | ICD-10-CM | POA: Diagnosis not present

## 2023-07-08 DIAGNOSIS — Z1611 Resistance to penicillins: Secondary | ICD-10-CM | POA: Diagnosis present

## 2023-07-08 DIAGNOSIS — M4804 Spinal stenosis, thoracic region: Secondary | ICD-10-CM | POA: Diagnosis not present

## 2023-07-08 DIAGNOSIS — R34 Anuria and oliguria: Secondary | ICD-10-CM | POA: Diagnosis not present

## 2023-07-08 DIAGNOSIS — R509 Fever, unspecified: Secondary | ICD-10-CM | POA: Diagnosis not present

## 2023-07-08 DIAGNOSIS — M50221 Other cervical disc displacement at C4-C5 level: Secondary | ICD-10-CM | POA: Diagnosis not present

## 2023-07-08 DIAGNOSIS — Z4789 Encounter for other orthopedic aftercare: Secondary | ICD-10-CM | POA: Diagnosis not present

## 2023-07-08 DIAGNOSIS — E876 Hypokalemia: Secondary | ICD-10-CM | POA: Diagnosis present

## 2023-07-08 DIAGNOSIS — M4642 Discitis, unspecified, cervical region: Secondary | ICD-10-CM | POA: Diagnosis present

## 2023-07-08 DIAGNOSIS — Z8673 Personal history of transient ischemic attack (TIA), and cerebral infarction without residual deficits: Secondary | ICD-10-CM

## 2023-07-08 DIAGNOSIS — G959 Disease of spinal cord, unspecified: Secondary | ICD-10-CM | POA: Diagnosis not present

## 2023-07-08 DIAGNOSIS — I10 Essential (primary) hypertension: Secondary | ICD-10-CM | POA: Diagnosis not present

## 2023-07-08 DIAGNOSIS — Z8249 Family history of ischemic heart disease and other diseases of the circulatory system: Secondary | ICD-10-CM

## 2023-07-08 DIAGNOSIS — E1165 Type 2 diabetes mellitus with hyperglycemia: Secondary | ICD-10-CM | POA: Diagnosis present

## 2023-07-08 DIAGNOSIS — H548 Legal blindness, as defined in USA: Secondary | ICD-10-CM | POA: Diagnosis present

## 2023-07-08 DIAGNOSIS — Z823 Family history of stroke: Secondary | ICD-10-CM

## 2023-07-08 DIAGNOSIS — M79603 Pain in arm, unspecified: Secondary | ICD-10-CM | POA: Diagnosis not present

## 2023-07-08 DIAGNOSIS — Z8674 Personal history of sudden cardiac arrest: Secondary | ICD-10-CM

## 2023-07-08 DIAGNOSIS — J929 Pleural plaque without asbestos: Secondary | ICD-10-CM | POA: Diagnosis not present

## 2023-07-08 DIAGNOSIS — E11319 Type 2 diabetes mellitus with unspecified diabetic retinopathy without macular edema: Secondary | ICD-10-CM | POA: Diagnosis present

## 2023-07-08 DIAGNOSIS — S14159D Other incomplete lesion at unspecified level of cervical spinal cord, subsequent encounter: Secondary | ICD-10-CM | POA: Diagnosis not present

## 2023-07-08 DIAGNOSIS — Z981 Arthrodesis status: Secondary | ICD-10-CM | POA: Diagnosis not present

## 2023-07-08 DIAGNOSIS — I120 Hypertensive chronic kidney disease with stage 5 chronic kidney disease or end stage renal disease: Secondary | ICD-10-CM | POA: Diagnosis not present

## 2023-07-08 DIAGNOSIS — M50023 Cervical disc disorder at C6-C7 level with myelopathy: Secondary | ICD-10-CM | POA: Diagnosis not present

## 2023-07-08 DIAGNOSIS — L89151 Pressure ulcer of sacral region, stage 1: Secondary | ICD-10-CM | POA: Diagnosis present

## 2023-07-08 DIAGNOSIS — R918 Other nonspecific abnormal finding of lung field: Secondary | ICD-10-CM | POA: Diagnosis not present

## 2023-07-08 DIAGNOSIS — M542 Cervicalgia: Secondary | ICD-10-CM | POA: Diagnosis not present

## 2023-07-08 DIAGNOSIS — E1122 Type 2 diabetes mellitus with diabetic chronic kidney disease: Secondary | ICD-10-CM | POA: Diagnosis present

## 2023-07-08 DIAGNOSIS — M4802 Spinal stenosis, cervical region: Secondary | ICD-10-CM | POA: Diagnosis present

## 2023-07-08 DIAGNOSIS — M79629 Pain in unspecified upper arm: Secondary | ICD-10-CM | POA: Diagnosis not present

## 2023-07-08 DIAGNOSIS — Z833 Family history of diabetes mellitus: Secondary | ICD-10-CM

## 2023-07-08 MED ORDER — METHOCARBAMOL 500 MG PO TABS
500.0000 mg | ORAL_TABLET | Freq: Once | ORAL | Status: AC
  Administered 2023-07-08: 500 mg via ORAL
  Filled 2023-07-08: qty 1

## 2023-07-08 NOTE — ED Provider Triage Note (Signed)
Emergency Medicine Provider Triage Evaluation Note  Shawn [name] , a 62 y.o. male  was evaluated in triage.  Pt complains of neck pain.  Review of Systems  Positive: Bilateral R>L neck pain and swelling Negative: UE weakness or numbness  Physical Exam  BP (!) 169/77 (BP Location: Left Arm)   Pulse 81   Temp 98.6 F (37 C) (Oral)   Resp 18   Wt 70 kg   SpO2 95%   BMI 21.52 kg/m  Gen:   Awake, no distress  Blind Resp:  Normal effort  MSK:   Moves extremities without difficulty Normal and equal grip strength in UE's - mild bicep/tricep weakness on right. There is a firm right paracervical mass to palpation that is mildly tender. Left trapezius tenderness without spasm.  Other:  Normal sensation UE's  Medical Decision Making  Medically screening exam initiated at 10:48 PM.  Appropriate orders placed.  Shawn [name] was informed that the remainder of the evaluation will be completed by another provider, this initial triage assessment does not replace that evaluation, and the importance of remaining in the ED until their evaluation is complete.  1-2 weeks of progressively worsening neck pain, R>L. Some mild upper arm weakness on exam.   No previous surgeries, no recent injury.   Shawn Anis, PA-C 07/08/23 2251

## 2023-07-08 NOTE — ED Triage Notes (Signed)
PT BIB EMS from home for complains of neck pain x 1 week. Hurts to move in all direction. Denies injury. Pain radiates into both shoulders.   Tylenol given by EMS for temp 100.2

## 2023-07-09 ENCOUNTER — Encounter (HOSPITAL_COMMUNITY): Payer: Self-pay | Admitting: Student

## 2023-07-09 ENCOUNTER — Emergency Department (HOSPITAL_COMMUNITY): Payer: Medicare Other

## 2023-07-09 DIAGNOSIS — B957 Other staphylococcus as the cause of diseases classified elsewhere: Secondary | ICD-10-CM | POA: Diagnosis present

## 2023-07-09 DIAGNOSIS — G959 Disease of spinal cord, unspecified: Secondary | ICD-10-CM | POA: Diagnosis not present

## 2023-07-09 DIAGNOSIS — M5412 Radiculopathy, cervical region: Secondary | ICD-10-CM | POA: Diagnosis present

## 2023-07-09 DIAGNOSIS — K5900 Constipation, unspecified: Secondary | ICD-10-CM | POA: Diagnosis present

## 2023-07-09 DIAGNOSIS — Z1611 Resistance to penicillins: Secondary | ICD-10-CM | POA: Diagnosis present

## 2023-07-09 DIAGNOSIS — K5901 Slow transit constipation: Secondary | ICD-10-CM | POA: Diagnosis not present

## 2023-07-09 DIAGNOSIS — M4802 Spinal stenosis, cervical region: Secondary | ICD-10-CM | POA: Diagnosis present

## 2023-07-09 DIAGNOSIS — E785 Hyperlipidemia, unspecified: Secondary | ICD-10-CM | POA: Diagnosis present

## 2023-07-09 DIAGNOSIS — M4852XA Collapsed vertebra, not elsewhere classified, cervical region, initial encounter for fracture: Secondary | ICD-10-CM | POA: Diagnosis present

## 2023-07-09 DIAGNOSIS — M542 Cervicalgia: Secondary | ICD-10-CM | POA: Diagnosis not present

## 2023-07-09 DIAGNOSIS — R011 Cardiac murmur, unspecified: Secondary | ICD-10-CM | POA: Diagnosis present

## 2023-07-09 DIAGNOSIS — L89151 Pressure ulcer of sacral region, stage 1: Secondary | ICD-10-CM | POA: Diagnosis present

## 2023-07-09 DIAGNOSIS — I132 Hypertensive heart and chronic kidney disease with heart failure and with stage 5 chronic kidney disease, or end stage renal disease: Secondary | ICD-10-CM | POA: Diagnosis present

## 2023-07-09 DIAGNOSIS — R918 Other nonspecific abnormal finding of lung field: Secondary | ICD-10-CM | POA: Diagnosis not present

## 2023-07-09 DIAGNOSIS — E1169 Type 2 diabetes mellitus with other specified complication: Secondary | ICD-10-CM | POA: Diagnosis present

## 2023-07-09 DIAGNOSIS — E11319 Type 2 diabetes mellitus with unspecified diabetic retinopathy without macular edema: Secondary | ICD-10-CM | POA: Diagnosis present

## 2023-07-09 DIAGNOSIS — I120 Hypertensive chronic kidney disease with stage 5 chronic kidney disease or end stage renal disease: Secondary | ICD-10-CM | POA: Diagnosis present

## 2023-07-09 DIAGNOSIS — K592 Neurogenic bowel, not elsewhere classified: Secondary | ICD-10-CM | POA: Diagnosis present

## 2023-07-09 DIAGNOSIS — D631 Anemia in chronic kidney disease: Secondary | ICD-10-CM | POA: Diagnosis present

## 2023-07-09 DIAGNOSIS — Z992 Dependence on renal dialysis: Secondary | ICD-10-CM | POA: Diagnosis not present

## 2023-07-09 DIAGNOSIS — E871 Hypo-osmolality and hyponatremia: Secondary | ICD-10-CM | POA: Diagnosis present

## 2023-07-09 DIAGNOSIS — J929 Pleural plaque without asbestos: Secondary | ICD-10-CM | POA: Diagnosis not present

## 2023-07-09 DIAGNOSIS — Z981 Arthrodesis status: Secondary | ICD-10-CM | POA: Diagnosis not present

## 2023-07-09 DIAGNOSIS — S14159D Other incomplete lesion at unspecified level of cervical spinal cord, subsequent encounter: Secondary | ICD-10-CM | POA: Diagnosis not present

## 2023-07-09 DIAGNOSIS — E11649 Type 2 diabetes mellitus with hypoglycemia without coma: Secondary | ICD-10-CM | POA: Diagnosis not present

## 2023-07-09 DIAGNOSIS — M50221 Other cervical disc displacement at C4-C5 level: Secondary | ICD-10-CM | POA: Diagnosis not present

## 2023-07-09 DIAGNOSIS — N250 Renal osteodystrophy: Secondary | ICD-10-CM | POA: Diagnosis not present

## 2023-07-09 DIAGNOSIS — M4622 Osteomyelitis of vertebra, cervical region: Secondary | ICD-10-CM | POA: Diagnosis present

## 2023-07-09 DIAGNOSIS — M898X9 Other specified disorders of bone, unspecified site: Secondary | ICD-10-CM | POA: Diagnosis present

## 2023-07-09 DIAGNOSIS — R34 Anuria and oliguria: Secondary | ICD-10-CM | POA: Diagnosis not present

## 2023-07-09 DIAGNOSIS — J9811 Atelectasis: Secondary | ICD-10-CM | POA: Diagnosis not present

## 2023-07-09 DIAGNOSIS — E1165 Type 2 diabetes mellitus with hyperglycemia: Secondary | ICD-10-CM | POA: Diagnosis present

## 2023-07-09 DIAGNOSIS — N186 End stage renal disease: Secondary | ICD-10-CM

## 2023-07-09 DIAGNOSIS — E876 Hypokalemia: Secondary | ICD-10-CM | POA: Diagnosis present

## 2023-07-09 DIAGNOSIS — Z4789 Encounter for other orthopedic aftercare: Secondary | ICD-10-CM | POA: Diagnosis not present

## 2023-07-09 DIAGNOSIS — Z79899 Other long term (current) drug therapy: Secondary | ICD-10-CM | POA: Diagnosis not present

## 2023-07-09 DIAGNOSIS — I5022 Chronic systolic (congestive) heart failure: Secondary | ICD-10-CM | POA: Diagnosis present

## 2023-07-09 DIAGNOSIS — M4712 Other spondylosis with myelopathy, cervical region: Secondary | ICD-10-CM | POA: Diagnosis present

## 2023-07-09 DIAGNOSIS — R7881 Bacteremia: Secondary | ICD-10-CM | POA: Diagnosis present

## 2023-07-09 DIAGNOSIS — I10 Essential (primary) hypertension: Secondary | ICD-10-CM | POA: Diagnosis not present

## 2023-07-09 DIAGNOSIS — M4642 Discitis, unspecified, cervical region: Secondary | ICD-10-CM | POA: Diagnosis present

## 2023-07-09 DIAGNOSIS — M50023 Cervical disc disorder at C6-C7 level with myelopathy: Secondary | ICD-10-CM | POA: Diagnosis not present

## 2023-07-09 DIAGNOSIS — E1122 Type 2 diabetes mellitus with diabetic chronic kidney disease: Secondary | ICD-10-CM | POA: Diagnosis present

## 2023-07-09 DIAGNOSIS — G992 Myelopathy in diseases classified elsewhere: Secondary | ICD-10-CM | POA: Diagnosis present

## 2023-07-09 DIAGNOSIS — J90 Pleural effusion, not elsewhere classified: Secondary | ICD-10-CM | POA: Diagnosis not present

## 2023-07-09 DIAGNOSIS — M4804 Spinal stenosis, thoracic region: Secondary | ICD-10-CM | POA: Diagnosis not present

## 2023-07-09 DIAGNOSIS — Z5181 Encounter for therapeutic drug level monitoring: Secondary | ICD-10-CM | POA: Diagnosis not present

## 2023-07-09 DIAGNOSIS — N2581 Secondary hyperparathyroidism of renal origin: Secondary | ICD-10-CM | POA: Diagnosis present

## 2023-07-09 LAB — BASIC METABOLIC PANEL
Anion gap: 15 (ref 5–15)
BUN: 20 mg/dL (ref 8–23)
CO2: 23 mmol/L (ref 22–32)
Calcium: 9.2 mg/dL (ref 8.9–10.3)
Chloride: 95 mmol/L — ABNORMAL LOW (ref 98–111)
Creatinine, Ser: 6.24 mg/dL — ABNORMAL HIGH (ref 0.61–1.24)
GFR, Estimated: 9 mL/min — ABNORMAL LOW (ref >60)
Glucose, Bld: 185 mg/dL — ABNORMAL HIGH (ref 70–99)
Potassium: 3.2 mmol/L — ABNORMAL LOW (ref 3.5–5.1)
Sodium: 133 mmol/L — ABNORMAL LOW (ref 135–145)

## 2023-07-09 LAB — RENAL FUNCTION PANEL
Albumin: 2.7 g/dL — ABNORMAL LOW (ref 3.5–5.0)
Anion gap: 14 (ref 5–15)
BUN: 23 mg/dL (ref 8–23)
CO2: 24 mmol/L (ref 22–32)
Calcium: 8.6 mg/dL — ABNORMAL LOW (ref 8.9–10.3)
Chloride: 97 mmol/L — ABNORMAL LOW (ref 98–111)
Creatinine, Ser: 6.82 mg/dL — ABNORMAL HIGH (ref 0.61–1.24)
GFR, Estimated: 8 mL/min — ABNORMAL LOW (ref >60)
Glucose, Bld: 97 mg/dL (ref 70–99)
Phosphorus: 3.9 mg/dL (ref 2.5–4.6)
Potassium: 3.3 mmol/L — ABNORMAL LOW (ref 3.5–5.1)
Sodium: 135 mmol/L (ref 135–145)

## 2023-07-09 LAB — CBC
HCT: 36.9 % — ABNORMAL LOW (ref 39.0–52.0)
HCT: 30.9 % — ABNORMAL LOW (ref 39.0–52.0)
Hemoglobin: 11.5 g/dL — ABNORMAL LOW (ref 13.0–17.0)
Hemoglobin: 9.7 g/dL — ABNORMAL LOW (ref 13.0–17.0)
MCH: 26.3 pg (ref 26.0–34.0)
MCH: 26.5 pg (ref 26.0–34.0)
MCHC: 31.2 g/dL (ref 30.0–36.0)
MCHC: 31.4 g/dL (ref 30.0–36.0)
MCV: 84.4 fL (ref 80.0–100.0)
MCV: 84.4 fL (ref 80.0–100.0)
Platelets: 363 10*3/uL (ref 150–400)
Platelets: 340 10*3/uL (ref 150–400)
RBC: 4.37 MIL/uL (ref 4.22–5.81)
RBC: 3.66 MIL/uL — ABNORMAL LOW (ref 4.22–5.81)
RDW: 17 % — ABNORMAL HIGH (ref 11.5–15.5)
RDW: 17 % — ABNORMAL HIGH (ref 11.5–15.5)
WBC: 6 10*3/uL (ref 4.0–10.5)
WBC: 5.6 10*3/uL (ref 4.0–10.5)
nRBC: 0 % (ref 0.0–0.2)
nRBC: 0 % (ref 0.0–0.2)

## 2023-07-09 LAB — CBG MONITORING, ED: Glucose-Capillary: 128 mg/dL — ABNORMAL HIGH (ref 70–99)

## 2023-07-09 LAB — C-REACTIVE PROTEIN: CRP: 7.8 mg/dL — ABNORMAL HIGH (ref <1.0)

## 2023-07-09 LAB — GLUCOSE, CAPILLARY: Glucose-Capillary: 134 mg/dL — ABNORMAL HIGH (ref 70–99)

## 2023-07-09 LAB — MRSA NEXT GEN BY PCR, NASAL: MRSA by PCR Next Gen: NOT DETECTED

## 2023-07-09 LAB — LIPID PANEL
Cholesterol: 159 mg/dL (ref 0–200)
HDL: 43 mg/dL (ref >40)
LDL Cholesterol: 97 mg/dL (ref 0–99)
Total CHOL/HDL Ratio: 3.7 {ratio}
Triglycerides: 97 mg/dL (ref <150)
VLDL: 19 mg/dL (ref 0–40)

## 2023-07-09 LAB — SEDIMENTATION RATE: Sed Rate: 63 mm/h — ABNORMAL HIGH (ref 0–16)

## 2023-07-09 LAB — HEPATITIS B SURFACE ANTIGEN: Hepatitis B Surface Ag: NONREACTIVE

## 2023-07-09 MED ORDER — ALTEPLASE 2 MG IJ SOLR: 2.0000 mg | Freq: Once | INTRAMUSCULAR | Status: DC | PRN

## 2023-07-09 MED ORDER — ROSUVASTATIN CALCIUM 20 MG PO TABS
40.0000 mg | ORAL_TABLET | Freq: Every day | ORAL | Status: DC
  Administered 2023-07-09: 40 mg via ORAL
  Filled 2023-07-09: qty 2

## 2023-07-09 MED ORDER — ONDANSETRON HCL 4 MG PO TABS: 4.0000 mg | ORAL_TABLET | Freq: Four times a day (QID) | ORAL | Status: DC | PRN

## 2023-07-09 MED ORDER — VANCOMYCIN HCL 750 MG/150ML IV SOLN
750.0000 mg | INTRAVENOUS | Status: DC
  Administered 2023-07-10: 750 mg via INTRAVENOUS
  Filled 2023-07-09 (×5): qty 150

## 2023-07-09 MED ORDER — LISINOPRIL 20 MG PO TABS
20.0000 mg | ORAL_TABLET | Freq: Two times a day (BID) | ORAL | Status: DC
  Administered 2023-07-09 – 2023-07-18 (×16): 20 mg via ORAL
  Filled 2023-07-09 (×16): qty 1

## 2023-07-09 MED ORDER — INSULIN ASPART 100 UNIT/ML IJ SOLN
0.0000 [IU] | Freq: Three times a day (TID) | INTRAMUSCULAR | Status: DC
  Administered 2023-07-09 – 2023-07-13 (×3): 1 [IU] via SUBCUTANEOUS
  Administered 2023-07-13: 3 [IU] via SUBCUTANEOUS
  Administered 2023-07-14: 1 [IU] via SUBCUTANEOUS
  Administered 2023-07-16 (×3): 2 [IU] via SUBCUTANEOUS
  Administered 2023-07-17 – 2023-07-18 (×2): 1 [IU] via SUBCUTANEOUS

## 2023-07-09 MED ORDER — GADOBUTROL 1 MMOL/ML IV SOLN
7.0000 mL | Freq: Once | INTRAVENOUS | Status: AC | PRN
  Administered 2023-07-09: 7 mL via INTRAVENOUS

## 2023-07-09 MED ORDER — ACETAMINOPHEN 325 MG PO TABS
650.0000 mg | ORAL_TABLET | Freq: Four times a day (QID) | ORAL | Status: DC | PRN
  Administered 2023-07-09 – 2023-07-13 (×2): 650 mg via ORAL
  Filled 2023-07-09 (×3): qty 2

## 2023-07-09 MED ORDER — LIDOCAINE HCL (PF) 1 % IJ SOLN
5.0000 mL | INTRAMUSCULAR | Status: DC | PRN
Start: 2023-07-09 — End: 2023-07-10

## 2023-07-09 MED ORDER — AMLODIPINE BESYLATE 5 MG PO TABS
5.0000 mg | ORAL_TABLET | Freq: Every day | ORAL | Status: DC
  Administered 2023-07-09 – 2023-07-18 (×10): 5 mg via ORAL
  Filled 2023-07-09 (×10): qty 1

## 2023-07-09 MED ORDER — HYDROMORPHONE HCL 1 MG/ML IJ SOLN
0.5000 mg | Freq: Four times a day (QID) | INTRAMUSCULAR | Status: DC | PRN
  Administered 2023-07-09 – 2023-07-15 (×13): 0.5 mg via INTRAVENOUS
  Filled 2023-07-09 (×13): qty 1

## 2023-07-09 MED ORDER — INSULIN GLARGINE-YFGN 100 UNIT/ML ~~LOC~~ SOLN
10.0000 [IU] | Freq: Every day | SUBCUTANEOUS | Status: DC
  Administered 2023-07-10 – 2023-07-13 (×3): 10 [IU] via SUBCUTANEOUS
  Filled 2023-07-09 (×7): qty 0.1

## 2023-07-09 MED ORDER — DORZOLAMIDE HCL-TIMOLOL MAL 2-0.5 % OP SOLN
1.0000 [drp] | Freq: Two times a day (BID) | OPHTHALMIC | Status: DC
  Administered 2023-07-09 – 2023-07-18 (×16): 1 [drp] via OPHTHALMIC
  Filled 2023-07-09: qty 10

## 2023-07-09 MED ORDER — ACETAMINOPHEN 650 MG RE SUPP: 650.0000 mg | Freq: Four times a day (QID) | RECTAL | Status: DC | PRN

## 2023-07-09 MED ORDER — LATANOPROST 0.005 % OP SOLN
1.0000 [drp] | Freq: Every day | OPHTHALMIC | Status: DC
  Administered 2023-07-09 – 2023-07-17 (×8): 1 [drp] via OPHTHALMIC
  Filled 2023-07-09: qty 2.5

## 2023-07-09 MED ORDER — BRIMONIDINE TARTRATE 0.2 % OP SOLN
1.0000 [drp] | Freq: Two times a day (BID) | OPHTHALMIC | Status: DC
  Administered 2023-07-09 – 2023-07-18 (×16): 1 [drp] via OPHTHALMIC
  Filled 2023-07-09: qty 5

## 2023-07-09 MED ORDER — LANTHANUM CARBONATE 500 MG PO CHEW
1000.0000 mg | CHEWABLE_TABLET | Freq: Three times a day (TID) | ORAL | Status: DC
  Administered 2023-07-09 – 2023-07-18 (×19): 1000 mg via ORAL
  Filled 2023-07-09 (×21): qty 2

## 2023-07-09 MED ORDER — CHLORHEXIDINE GLUCONATE CLOTH 2 % EX PADS
6.0000 | MEDICATED_PAD | Freq: Every day | CUTANEOUS | Status: DC
  Administered 2023-07-10 – 2023-07-18 (×9): 6 via TOPICAL

## 2023-07-09 MED ORDER — SODIUM CHLORIDE 0.9 % IV SOLN: 2.0000 g | Freq: Once | INTRAVENOUS | Status: DC

## 2023-07-09 MED ORDER — ANTICOAGULANT SODIUM CITRATE 4% (200MG/5ML) IV SOLN
5.0000 mL | Status: DC | PRN
Start: 2023-07-09 — End: 2023-07-10

## 2023-07-09 MED ORDER — ONDANSETRON HCL 4 MG/2ML IJ SOLN
4.0000 mg | Freq: Four times a day (QID) | INTRAMUSCULAR | Status: DC | PRN
  Administered 2023-07-12 – 2023-07-15 (×2): 4 mg via INTRAVENOUS
  Filled 2023-07-09: qty 2

## 2023-07-09 MED ORDER — VANCOMYCIN HCL 1500 MG/300ML IV SOLN
1500.0000 mg | Freq: Once | INTRAVENOUS | Status: AC
  Administered 2023-07-09: 1500 mg via INTRAVENOUS
  Filled 2023-07-09: qty 300

## 2023-07-09 MED ORDER — HEPARIN SODIUM (PORCINE) 5000 UNIT/ML IJ SOLN
5000.0000 [IU] | Freq: Three times a day (TID) | INTRAMUSCULAR | Status: DC
  Administered 2023-07-09 – 2023-07-18 (×22): 5000 [IU] via SUBCUTANEOUS
  Filled 2023-07-09 (×21): qty 1

## 2023-07-09 MED ORDER — SENNOSIDES-DOCUSATE SODIUM 8.6-50 MG PO TABS: 1.0000 | ORAL_TABLET | Freq: Every evening | ORAL | Status: DC | PRN

## 2023-07-09 MED ORDER — SODIUM CHLORIDE 0.9 % IV SOLN
1.0000 g | INTRAVENOUS | Status: DC
  Administered 2023-07-09 – 2023-07-10 (×2): 1 g via INTRAVENOUS
  Filled 2023-07-09 (×3): qty 10

## 2023-07-09 MED ORDER — LIDOCAINE-PRILOCAINE 2.5-2.5 % EX CREA
1.0000 | TOPICAL_CREAM | CUTANEOUS | Status: DC | PRN
Start: 2023-07-09 — End: 2023-07-10

## 2023-07-09 MED ORDER — VANCOMYCIN HCL 1.5 G IV SOLR
1500.0000 mg | Freq: Once | INTRAVENOUS | Status: DC
  Filled 2023-07-09: qty 30

## 2023-07-09 MED ORDER — HEPARIN SODIUM (PORCINE) 1000 UNIT/ML DIALYSIS
1000.0000 [IU] | INTRAMUSCULAR | Status: DC | PRN
  Filled 2023-07-09: qty 1

## 2023-07-09 MED ORDER — PENTAFLUOROPROP-TETRAFLUOROETH EX AERO
1.0000 | INHALATION_SPRAY | CUTANEOUS | Status: DC | PRN
Start: 2023-07-09 — End: 2023-07-10

## 2023-07-09 MED ORDER — ASPIRIN 325 MG PO TBEC
325.0000 mg | DELAYED_RELEASE_TABLET | Freq: Every day | ORAL | Status: DC
  Administered 2023-07-09 – 2023-07-17 (×8): 325 mg via ORAL
  Filled 2023-07-09 (×10): qty 1

## 2023-07-09 MED ORDER — HYDROMORPHONE HCL 1 MG/ML IJ SOLN
0.5000 mg | Freq: Once | INTRAMUSCULAR | Status: AC
  Administered 2023-07-09: 0.5 mg via INTRAVENOUS
  Filled 2023-07-09: qty 1

## 2023-07-09 MED ORDER — HEPARIN SODIUM (PORCINE) 1000 UNIT/ML DIALYSIS
5000.0000 [IU] | Freq: Once | INTRAMUSCULAR | Status: AC
  Administered 2023-07-10: 5000 [IU] via INTRAVENOUS_CENTRAL
  Filled 2023-07-09 (×2): qty 5

## 2023-07-09 NOTE — ED Notes (Signed)
 [facility] phleb to draw cultures and crp, sed rate

## 2023-07-09 NOTE — ED Notes (Signed)
 ABT requested from pharm

## 2023-07-09 NOTE — Progress Notes (Signed)
 New Admission Note:   Arrival Method: stretcher Mental Orientation: aa+ox4 Telemetry: box 19 Assessment: Completed Skin: C/D/I IV: SL Pain: denies Tubes: n/a Safety Measures: Safety Fall Prevention Plan has been given, discussed and signed Admission: Completed [facility] Orientation: Patient has been orientated to the room, unit and staff.  Family: present  Orders have been reviewed and implemented. Will continue to monitor the patient. Call light has been placed within reach and bed alarm has been activated.   Doyal Sias, RN

## 2023-07-09 NOTE — ED Notes (Signed)
 ED TO INPATIENT HANDOFF REPORT  ED Nurse Name and Phone #: Thom Ahle, RN [phone]  S Name/Age/Gender Shawn [name] 62 y.o. male Room/Bed: H022C/H022C  Code Status   Code Status: Full Code  Home/SNF/Other Home Patient oriented to: self, place, time, and situation Is this baseline? Yes   Triage Complete: Triage complete  Chief Complaint Acute osteomyelitis of cervical spine (HCC) [M46.22]  Triage Note PT BIB EMS from home for complains of neck pain x 1 week. Hurts to move in all direction. Denies injury. Pain radiates into both shoulders.   Tylenol  given by EMS for temp 100.2    Allergies No Known Allergies  Level of Care/Admitting Diagnosis ED Disposition     ED Disposition  Admit   Condition  --   Comment  Hospital Area: [facility] [[id number]]  Level of Care: Med-Surg [16]  May admit patient to [facility] or [facility] if equivalent level of care is available:: No  Covid Evaluation: Asymptomatic - no recent exposure (last 10 days) testing not required  Diagnosis: Acute osteomyelitis of cervical spine [facility]) [[id number]]  Admitting Physician: LOU CLARETTA HERO [[id number]]  Attending Physician: LOU CLARETTA HERO [[id number]]  Certification:: I certify this patient will need inpatient services for at least 2 midnights  Expected Medical Readiness: 07/17/2023          B Medical/Surgery History Past Medical History:  Diagnosis Date   Anemia    Arthritis    patient denies   Blind right eye    Complication of anesthesia    Diabetes mellitus    type II   ESRD on hemodialysis (HCC)    Dialysis since 2011 MWF   HCAP (healthcare-associated pneumonia) 12/19/2022   Hypertension    Legally blind    No pertinent past medical history    PONV (postoperative nausea and vomiting)    N/V- became dehydrated had to have IV fluids- 01/2015   Shortness of breath dyspnea    when has too fluid   Stroke (HCC) 07/09/2010   date per patient   Syncope     Unspecified cerebral artery occlusion with cerebral infarction 04/02/2013   Past Surgical History:  Procedure Laterality Date   AV FISTULA PLACEMENT Right 05/12/2013   Procedure: INSERTION OF ARTERIOVENOUS (AV) GORE-TEX GRAFT ARM; ULTRASOUND GUIDED;  Surgeon: Redell LITTIE Door, MD;  Location: [facility] OR;  Service: Vascular;  Laterality: Right;   AV FISTULA PLACEMENT Left 01/18/2015   Procedure: INSERTION OF ARTERIOVENOUS GORE-TEX GRAFT LEFT UPPER ARM;  Surgeon: Redell LITTIE Door, MD;  Location: [facility] OR;  Service: Vascular;  Laterality: Left;   COLONOSCOPY  06/2012   EYE SURGERY Bilateral    retina surgery both eyes, cataract surgery both eyes   HERNIA REPAIR     right inguinal   INSERTION OF DIALYSIS CATHETER Right    left arm graft  10/2010   LIGATION ARTERIOVENOUS GORTEX GRAFT Left 05/03/2015   Procedure: LIGATION ARTERIOVENOUS GORTEX GRAFT-LEFT UPPER ARM;  Surgeon: Redell LITTIE Door, MD;  Location: [facility] OR;  Service: Vascular;  Laterality: Left;     A IV Location/Drains/Wounds Patient Lines/Drains/Airways Status     Active Line/Drains/Airways     Name Placement date Placement time Site Days   Peripheral IV 07/09/23 22 G Anterior;Left Forearm 07/09/23  1000  Forearm  less than 1   Fistula / Graft Right Upper arm Arteriovenous vein graft 05/12/13  0939  Upper arm  3710   Fistula / Graft Left Upper arm  01/18/15  0824  Upper arm  3094   Hemodialysis Catheter --  --  Subclavian  --   Hemodialysis Catheter Right Subclavian --  --  Subclavian  --   Hemodialysis Catheter Right Subclavian --  --  Subclavian  --            Intake/Output Last 24 hours No intake or output data in the 24 hours ending 07/09/23 1651  Labs/Imaging Results for orders placed or performed during the hospital encounter of 07/08/23 (from the past 48 hours)  CBC     Status: Abnormal   Collection Time: 07/09/23 11:50 AM  Result Value Ref Range   WBC 6.0 4.0 - 10.5 K/uL   RBC 4.37 4.22 - 5.81 MIL/uL   Hemoglobin 11.5 (L) 13.0 -  17.0 g/dL   HCT 63.0 (L) 60.9 - 47.9 %   MCV 84.4 80.0 - 100.0 fL   MCH 26.3 26.0 - 34.0 pg   MCHC 31.2 30.0 - 36.0 g/dL   RDW 82.9 (H) 88.4 - 84.4 %   Platelets 363 150 - 400 K/uL   nRBC 0.0 0.0 - 0.2 %    Comment: Performed at [facility], [address], [place], [place] [address]  Basic metabolic panel     Status: Abnormal   Collection Time: 07/09/23 11:50 AM  Result Value Ref Range   Sodium 133 (L) 135 - 145 mmol/L   Potassium 3.2 (L) 3.5 - 5.1 mmol/L   Chloride 95 (L) 98 - 111 mmol/L   CO2 23 22 - 32 mmol/L   Glucose, Bld 185 (H) 70 - 99 mg/dL    Comment: Glucose reference range applies only to samples taken after fasting for at least 8 hours.   BUN 20 8 - 23 mg/dL   Creatinine, Ser 3.75 (H) 0.61 - 1.24 mg/dL   Calcium  9.2 8.9 - 10.3 mg/dL   GFR, Estimated 9 (L) >60 mL/min    Comment: (NOTE) Calculated using the CKD-EPI Creatinine Equation (2021)    Anion gap 15 5 - 15    Comment: Performed at [facility], [address] [place], [place] [address]  Sedimentation rate     Status: Abnormal   Collection Time: 07/09/23  2:00 PM  Result Value Ref Range   Sed Rate 63 (H) 0 - 16 mm/hr    Comment: Performed at [facility], [address] [place], [place] [address]  C-reactive protein     Status: Abnormal   Collection Time: 07/09/23  2:00 PM  Result Value Ref Range   CRP 7.8 (H) <1.0 mg/dL    Comment: Performed at [facility], [address] [place], [place] [address]  Lipid panel     Status: None   Collection Time: 07/09/23  3:23 PM  Result Value Ref Range   Cholesterol 159 0 - 200 mg/dL   Triglycerides 97 <849 mg/dL   HDL 43 >59 mg/dL   Total CHOL/HDL Ratio 3.7 RATIO   VLDL 19 0 - 40 mg/dL   LDL Cholesterol 97 0 - 99 mg/dL    Comment:        Total Cholesterol/HDL:CHD Risk Coronary Heart Disease Risk Table                     Men   Women  1/2 Average Risk   3.4   3.3  Average Risk       5.0   4.4  2 X Average Risk  9.6   7.1  3 X  Average Risk  23.4   11.0        Use the calculated Patient Ratio above and the CHD Risk Table to determine the patient's CHD Risk.        ATP III CLASSIFICATION (LDL):  <100     mg/dL   Optimal  899-870  mg/dL   Near or Above                    Optimal  130-159  mg/dL   Borderline  839-810  mg/dL   High  >809     mg/dL   Very High Performed at [facility], [address], [place], [place] [address]    MR Cervical Spine W and Wo Contrast Result Date: 07/09/2023 CLINICAL DATA:  Osteomyelitis, cervical; Osteomyelitis, thoracic patient with neck pain to upper back ct with ? infection. EXAM: MRI CERVICAL AND THORACIC SPINE WITHOUT AND WITH CONTRAST TECHNIQUE: Multiplanar and multiecho pulse sequences of the cervical spine, to include the craniocervical junction and cervicothoracic junction, and the thoracic spine, were obtained without and with intravenous contrast. CONTRAST:  7mL GADAVIST  GADOBUTROL  1 MMOL/ML IV SOLN COMPARISON:  CT cervical spine 07/09/2023. FINDINGS: MRI CERVICAL SPINE FINDINGS Alignment: Destruction and collapse of the C6 and C7 vertebral bodies results in slight retrolisthesis of C6 on C7. 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 Trace prevertebral edema. No evidence of epidural abscess. Cord: Severe compression of the spinal cord at the C6-7 level. Posterior Fossa, vertebral arteries, paraspinal tissues: As above. Otherwise, moderate prevertebral phlegmon at C6-7. Disc levels: C2-C3:  Normal. C3-C4: Central disc protrusion and ligamentum flavum buckling results in moderate spinal canal stenosis. Facet arthropathy and uncovertebral joint spurring results in severe bilateral neural foraminal narrowing. C4-C5: Disc bulge and ligamentum flavum buckling results in moderate-to-severe  spinal canal stenosis. Facet arthropathy and uncovertebral joint spurring contribute to severe bilateral neural foraminal narrowing. C5-C6: Disc bulge results in mild spinal canal stenosis. Left-greater-than-right facet arthropathy and uncovertebral joint spurring results in severe left and moderate right neural foraminal narrowing. C6-C7: Collapse of the vertebral bodies, retrolisthesis and retropulsion of the posterior cortex of C6 results in severe spinal canal stenosis and severe bilateral neural foraminal narrowing. C7-T1:  Unremarkable. MRI THORACIC SPINE FINDINGS Alignment:  Normal. Vertebrae: No fracture evidence of discitis-osteomyelitis. Acute appearing degenerative Schmorl's nodes at T11-12 and T12-L1. Cord: Normal spinal cord signal and volume. No abnormal enhancement. Paraspinal and other soft tissues: Polycystic kidneys. Disc levels: No significant disc herniation, spinal canal stenosis or neural foraminal narrowing. IMPRESSION: 1. 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 Severe spinal canal stenosis with severe compression of the spinal cord and severe bilateral neural foraminal narrowing at this level. No evidence of epidural abscess. 2. No evidence of discitis-osteomyelitis in the thoracic spine. 3. Acute appearing degenerative Schmorl's nodes at T11-12 and T12-L1. 4. Multilevel cervical spondylosis, worst at C4-5 where there is moderate-to-severe spinal canal stenosis and severe bilateral neural foraminal narrowing. Electronically Signed   By: Ryan Chess M.D.   On: 07/09/2023 12:23   MR THORACIC SPINE W WO CONTRAST Result Date: 07/09/2023 CLINICAL DATA:  Osteomyelitis, cervical; Osteomyelitis, thoracic patient with neck pain to upper back ct with ? infection.  EXAM: MRI CERVICAL AND THORACIC SPINE WITHOUT AND WITH CONTRAST TECHNIQUE:  Multiplanar and multiecho pulse sequences of the cervical spine, to include the craniocervical junction and cervicothoracic junction, and the thoracic spine, were obtained without and with intravenous contrast. CONTRAST:  7mL GADAVIST  GADOBUTROL  1 MMOL/ML IV SOLN COMPARISON:  CT cervical spine 07/09/2023. FINDINGS: MRI CERVICAL SPINE FINDINGS Alignment: Destruction and collapse of the C6 and C7 vertebral bodies results in slight retrolisthesis of C6 on C7. 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 Trace prevertebral edema. No evidence of epidural abscess. Cord: Severe compression of the spinal cord at the C6-7 level. Posterior Fossa, vertebral arteries, paraspinal tissues: As above. Otherwise, moderate prevertebral phlegmon at C6-7. Disc levels: C2-C3:  Normal. C3-C4: Central disc protrusion and ligamentum flavum buckling results in moderate spinal canal stenosis. Facet arthropathy and uncovertebral joint spurring results in severe bilateral neural foraminal narrowing. C4-C5: Disc bulge and ligamentum flavum buckling results in moderate-to-severe spinal canal stenosis. Facet arthropathy and uncovertebral joint spurring contribute to severe bilateral neural foraminal narrowing. C5-C6: Disc bulge results in mild spinal canal stenosis. Left-greater-than-right facet arthropathy and uncovertebral joint spurring results in severe left and moderate right neural foraminal narrowing. C6-C7: Collapse of the vertebral bodies, retrolisthesis and retropulsion of the posterior cortex of C6 results in severe spinal canal stenosis and severe bilateral neural foraminal narrowing. C7-T1:  Unremarkable. MRI THORACIC SPINE FINDINGS Alignment:  Normal. Vertebrae: No fracture evidence of discitis-osteomyelitis. Acute appearing degenerative Schmorl's  nodes at T11-12 and T12-L1. Cord: Normal spinal cord signal and volume. No abnormal enhancement. Paraspinal and other soft tissues: Polycystic kidneys. Disc levels: No significant disc herniation, spinal canal stenosis or neural foraminal narrowing. IMPRESSION: 1. 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 Severe spinal canal stenosis with severe compression of the spinal cord and severe bilateral neural foraminal narrowing at this level. No evidence of epidural abscess. 2. No evidence of discitis-osteomyelitis in the thoracic spine. 3. Acute appearing degenerative Schmorl's nodes at T11-12 and T12-L1. 4. Multilevel cervical spondylosis, worst at C4-5 where there is moderate-to-severe spinal canal stenosis and severe bilateral neural foraminal narrowing. Electronically Signed   By: Ryan Chess M.D.   On: 07/09/2023 12:23   DG Chest 1 View Result Date: 07/09/2023 CLINICAL DATA:  ESRD EXAM: CHEST  1 VIEW COMPARISON:  Chest radiograph dated December 22, 2022. FINDINGS: The heart size and mediastinal contours are within normal limits. Right IJ hemodialysis catheter tip overlies the mid right atrium, similar to the prior exam. Small right-greater-than-left pleural effusions with associated basilar atelectasis. Streaky opacities at the right lung base. Linear scarring/atelectasis in the mid left lung. No definite pneumothorax. Vascular stent in the right upper arm. No acute osseous abnormality. IMPRESSION: 1. Small bilateral right-greater-than-left pleural effusions. 2. Streaky opacities at the right lung base could represent atelectasis or infiltrate. Electronically Signed   By: Harrietta Sherry M.D.   On: 07/09/2023 09:48   CT Cervical Spine Wo Contrast Result Date: 07/09/2023 CLINICAL DATA:  Acute neck pain EXAM: CT CERVICAL SPINE WITHOUT CONTRAST TECHNIQUE:  Multidetector CT imaging of the cervical spine was performed without intravenous contrast. Multiplanar CT image reconstructions were also generated. RADIATION DOSE REDUCTION: This exam was performed according to the departmental dose-optimization program which includes automated exposure control, adjustment of the  mA and/or kV according to patient size and/or use of iterative reconstruction technique. COMPARISON:  None Available. FINDINGS: Alignment: There is mild anterior facet subluxation at C6-7. Skull base and vertebrae: Severe erosive changes of C6 and C7. No acute fracture. The other vertebral bodies are unremarkable. Soft tissues and spinal canal: No prevertebral fluid or swelling. No visible canal hematoma. Disc levels: There is severe spinal canal stenosis at the C6-7 level Upper chest: Biapical pleural thickening with suspected loculated effusions. Other: None. IMPRESSION: 1. Severe erosive changes of C6 and C7 with severe spinal canal stenosis at the C6-7 level. This is concerning for discitis/osteomyelitis. MRI with and without contrast is recommended for further evaluation. 2. Biapical pleural thickening with suspected loculated effusions. Electronically Signed   By: Franky Stanford M.D.   On: 07/09/2023 02:25    Pending Labs Unresulted Labs (From admission, onward)     Start     Ordered   07/09/23 1511  Hemoglobin A1c  Once,   R        07/09/23 1510   07/09/23 1436  Culture, blood (Routine X 2) w Reflex to ID Panel  BLOOD CULTURE X 2,   R (with STAT occurrences)      07/09/23 1435   07/09/23 1436  Hepatitis B surface antigen  (New Admission Hemo Labs (Hepatitis B))  Once,   URGENT        07/09/23 1437   07/09/23 1436  Hepatitis B surface antibody,quantitative  (New Admission Hemo Labs (Hepatitis B))  Once,   URGENT        07/09/23 1437   Signed and Held  Renal function panel  Once,   R        Signed and Held   Signed and Held  CBC  Once,   R        Signed and Held             Vitals/Pain Today's Vitals   07/09/23 1250 07/09/23 1511 07/09/23 1612 07/09/23 1626  BP: (!) 181/85     Pulse: 91     Resp: 16     Temp: 98 F (36.7 C)     TempSrc: Oral     SpO2: 100%     Weight:  154 lb (69.9 kg)    Height:  5' 11 (1.803 m)    PainSc:   Asleep 6     Isolation Precautions No active isolations  Medications Medications  HYDROmorphone  (DILAUDID ) injection 0.5 mg (0.5 mg Intravenous Given 07/09/23 1525)  Chlorhexidine  Gluconate Cloth 2 % PADS 6 each (has no administration in time range)  heparin  injection 5,000 Units (5,000 Units Subcutaneous Given 07/09/23 1633)  acetaminophen  (TYLENOL ) tablet 650 mg (650 mg Oral Given 07/09/23 1629)    Or  acetaminophen  (TYLENOL ) suppository 650 mg ( Rectal See Alternative 07/09/23 1629)  senna-docusate (Senokot-S) tablet 1 tablet (has no administration in time range)  ondansetron  (ZOFRAN ) tablet 4 mg (has no administration in time range)    Or  ondansetron  (ZOFRAN ) injection 4 mg (has no administration in time range)  ceFEPIme  (MAXIPIME ) 1 g in sodium chloride  0.9 % 100 mL IVPB (1 g Intravenous New Bag/Given 07/09/23 1647)  vancomycin  (VANCOREADY) IVPB 750 mg/150 mL (has no administration in time range)  vancomycin  (VANCOREADY) IVPB 1500 mg/300 mL (1,500 mg Intravenous New Bag/Given 07/09/23 1648)  amLODipine  (NORVASC ) tablet 5 mg (5 mg Oral Given 07/09/23 1629)  aspirin  EC tablet 325 mg (has no administration in time range)  brimonidine  (ALPHAGAN )  0.2 % ophthalmic solution 1 drop (has no administration in time range)  dorzolamide -timolol  (COSOPT ) 2-0.5 % ophthalmic solution 1 drop (has no administration in time range)  lanthanum  (FOSRENOL ) chewable tablet 1,000 mg (has no administration in time range)  latanoprost  (XALATAN ) 0.005 % ophthalmic solution 1 drop (has no administration in time range)  rosuvastatin  (CRESTOR ) tablet 40 mg (40 mg Oral Given 07/09/23 1629)  lisinopril  (ZESTRIL ) tablet 20 mg (has no  administration in time range)  insulin  aspart (novoLOG ) injection 0-9 Units (has no administration in time range)  insulin  glargine-yfgn (SEMGLEE ) injection 10 Units (has no administration in time range)  methocarbamol  (ROBAXIN ) tablet 500 mg (500 mg Oral Given 07/08/23 2251)  gadobutrol  (GADAVIST ) 1 MMOL/ML injection 7 mL (7 mLs Intravenous Contrast Given 07/09/23 1015)    Mobility walks     Focused Assessments Neuro Assessment Handoff:  Swallow screen pass? N/a         Neuro Assessment:   Neuro Checks:      Has TPA been given? N/a If patient is a Neuro Trauma and patient is going to OR before floor call report to 4N Charge nurse: [phone] or [phone]   R Recommendations: See Admitting Provider Note  Report given to:   Additional Notes: A&Ox4, c-collar in place, admit for osteomylitis of cervical spine, vanc and cefepime  infusing. H/o DM, BS earlier was 185, inflammatory markers elevated, plan for surgical intervention at some pointpain and fever meds given

## 2023-07-09 NOTE — ED Notes (Signed)
Patient transported to xray and MRI prior to obtaining labs.  MD notified

## 2023-07-09 NOTE — Progress Notes (Signed)
 Pharmacy Antibiotic Note  Shawn [name] is a 62 y.o. male admitted on 07/08/2023 with  osteomyelitis .  Pharmacy has been consulted for vancomycin , cefepime  dosing.  ESRD on iHD planning for MWF schedule  Plan: Vancomycin  1500mg  IV x 1 followed by vancomycin  750mg  IV given after HD session  -levels PRN per protocol -goal trough 15-20 mcg/mL Cefepime  1g IV q24h (to be given after iHD on iHD days) F/u LOT, renal func, cultures   Height: 5' 11 (180.3 cm) Weight: 69.9 kg (154 lb) IBW/kg (Calculated) : 75.3  Temp (24hrs), Avg:97.8 F (36.6 C), Min:97.5 F (36.4 C), Max:98.6 F (37 C)  Recent Labs  Lab 07/09/23 1150  WBC 6.0  CREATININE 6.24*    Estimated Creatinine Clearance: 12.1 mL/min (A) (by C-G formula based on SCr of 6.24 mg/dL (H)).    No Known Allergies  Antimicrobials this admission: Cefepime  12/31> Vanc 12/31>  Dose adjustments this admission:  Microbiology results: 12/31 [facility]  Thank you for allowing pharmacy to be a part of this patient's care.  Sharyne Glatter, PharmD, BCCCP Clinical Pharmacist 07/09/2023 3:13 PM

## 2023-07-09 NOTE — ED Notes (Signed)
Meds/ ABT requested from pharm

## 2023-07-09 NOTE — ED Notes (Signed)
Resting/ sleeping, cervical collar remains, NAD, calm, IV antibiotics infusing.

## 2023-07-09 NOTE — Consult Note (Signed)
 Reason for Consult: Neck pain possible cervical osteomyelitis Referring Physician: Emergency department  Shawn [name] is an 62 y.o. male.  HPI: 62 year old male with multiple medical issues including end-stage renal disease on hemodialysis, diabetes mellitus.  Cerebrovascular disease status post prior infarct.  Diabetic retinopathy with history of blindness.  Patient presents with 2-week history of posterior cervical pain with some radiating pain into bilateral upper extremities.  No history of obvious infection.  No fevers or chills.  Patient remains ambulatory.  Past Medical History:  Diagnosis Date   Anemia    Arthritis    patient denies   Blind right eye    Complication of anesthesia    Diabetes mellitus    type II   ESRD on hemodialysis [facility])    Dialysis since 2011 MWF   HCAP (healthcare-associated pneumonia) 12/19/2022   Hypertension    Legally blind    No pertinent past medical history    PONV (postoperative nausea and vomiting)    N/V- became dehydrated had to have IV fluids- 01/2015   Shortness of breath dyspnea    when has too fluid   Stroke (HCC) 07/09/2010   date per patient   Syncope    Unspecified cerebral artery occlusion with cerebral infarction 04/02/2013    Past Surgical History:  Procedure Laterality Date   AV FISTULA PLACEMENT Right 05/12/2013   Procedure: INSERTION OF ARTERIOVENOUS (AV) GORE-TEX GRAFT ARM; ULTRASOUND GUIDED;  Surgeon: Redell LITTIE Door, MD;  Location: [facility] OR;  Service: Vascular;  Laterality: Right;   AV FISTULA PLACEMENT Left 01/18/2015   Procedure: INSERTION OF ARTERIOVENOUS GORE-TEX GRAFT LEFT UPPER ARM;  Surgeon: Redell LITTIE Door, MD;  Location: [facility] OR;  Service: Vascular;  Laterality: Left;   COLONOSCOPY  06/2012   EYE SURGERY Bilateral    retina surgery both eyes, cataract surgery both eyes   HERNIA REPAIR     right inguinal   INSERTION OF DIALYSIS CATHETER Right    left arm graft  10/2010   LIGATION ARTERIOVENOUS GORTEX GRAFT Left  05/03/2015   Procedure: LIGATION ARTERIOVENOUS GORTEX GRAFT-LEFT UPPER ARM;  Surgeon: Redell LITTIE Door, MD;  Location: [facility] OR;  Service: Vascular;  Laterality: Left;    Family History  Problem Relation Age of Onset   Diabetes Mother    Alzheimer's disease Mother    Cancer Father    Heart disease Father    Cancer Sister    Stroke Sister     Social History:  reports that he has never smoked. He has never used smokeless tobacco. He reports that he does not drink alcohol  and does not use drugs.  Allergies: No Known Allergies  Medications: I have reviewed the patient's current medications.  Results for orders placed or performed during the hospital encounter of 07/08/23 (from the past 48 hours)  CBC     Status: Abnormal   Collection Time: 07/09/23 11:50 AM  Result Value Ref Range   WBC 6.0 4.0 - 10.5 K/uL   RBC 4.37 4.22 - 5.81 MIL/uL   Hemoglobin 11.5 (L) 13.0 - 17.0 g/dL   HCT 63.0 (L) 60.9 - 47.9 %   MCV 84.4 80.0 - 100.0 fL   MCH 26.3 26.0 - 34.0 pg   MCHC 31.2 30.0 - 36.0 g/dL   RDW 82.9 (H) 88.4 - 84.4 %   Platelets 363 150 - 400 K/uL   nRBC 0.0 0.0 - 0.2 %    Comment: Performed at [facility], [address] [place], [place] [address]  Basic metabolic panel     Status: Abnormal   Collection Time: 07/09/23 11:50 AM  Result Value Ref Range   Sodium 133 (L) 135 - 145 mmol/L   Potassium 3.2 (L) 3.5 - 5.1 mmol/L   Chloride 95 (L) 98 - 111 mmol/L   CO2 23 22 - 32 mmol/L   Glucose, Bld 185 (H) 70 - 99 mg/dL    Comment: Glucose reference range applies only to samples taken after fasting for at least 8 hours.   BUN 20 8 - 23 mg/dL   Creatinine, Ser 3.75 (H) 0.61 - 1.24 mg/dL   Calcium  9.2 8.9 - 10.3 mg/dL   GFR, Estimated 9 (L) >60 mL/min    Comment: (NOTE) Calculated using the CKD-EPI Creatinine Equation (2021)    Anion gap 15 5 - 15    Comment: Performed at [facility], [address] [place], [place] [address]    MR Cervical Spine W and Wo  Contrast Result Date: 07/09/2023 CLINICAL DATA:  Osteomyelitis, cervical; Osteomyelitis, thoracic patient with neck pain to upper back ct with ? infection. EXAM: MRI CERVICAL AND THORACIC SPINE WITHOUT AND WITH CONTRAST TECHNIQUE: Multiplanar and multiecho pulse sequences of the cervical spine, to include the craniocervical junction and cervicothoracic junction, and the thoracic spine, were obtained without and with intravenous contrast. CONTRAST:  7mL GADAVIST  GADOBUTROL  1 MMOL/ML IV SOLN COMPARISON:  CT cervical spine 07/09/2023. FINDINGS: MRI CERVICAL SPINE FINDINGS Alignment: Destruction and collapse of the C6 and C7 vertebral bodies results in slight retrolisthesis of C6 on C7. 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 Trace prevertebral edema. No evidence of epidural abscess. Cord: Severe compression of the spinal cord at the C6-7 level. Posterior Fossa, vertebral arteries, paraspinal tissues: As above. Otherwise, moderate prevertebral phlegmon at C6-7. Disc levels: C2-C3:  Normal. C3-C4: Central disc protrusion and ligamentum flavum buckling results in moderate spinal canal stenosis. Facet arthropathy and uncovertebral joint spurring results in severe bilateral neural foraminal narrowing. C4-C5: Disc bulge and ligamentum flavum buckling results in moderate-to-severe spinal canal stenosis. Facet arthropathy and uncovertebral joint spurring contribute to severe bilateral neural foraminal narrowing. C5-C6: Disc bulge results in mild spinal canal stenosis. Left-greater-than-right facet arthropathy and uncovertebral joint spurring results in severe left and moderate right neural foraminal narrowing. C6-C7: Collapse of the vertebral bodies, retrolisthesis and retropulsion of the posterior cortex of C6 results in severe  spinal canal stenosis and severe bilateral neural foraminal narrowing. C7-T1:  Unremarkable. MRI THORACIC SPINE FINDINGS Alignment:  Normal. Vertebrae: No fracture evidence of discitis-osteomyelitis. Acute appearing degenerative Schmorl's nodes at T11-12 and T12-L1. Cord: Normal spinal cord signal and volume. No abnormal enhancement. Paraspinal and other soft tissues: Polycystic kidneys. Disc levels: No significant disc herniation, spinal canal stenosis or neural foraminal narrowing. IMPRESSION: 1. 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 Severe spinal canal stenosis with severe compression of the spinal cord and severe bilateral neural foraminal narrowing at this level. No evidence of epidural abscess. 2. No evidence of discitis-osteomyelitis in the thoracic spine. 3. Acute appearing degenerative Schmorl's nodes at T11-12 and T12-L1. 4. Multilevel cervical spondylosis, worst at C4-5 where there is moderate-to-severe spinal canal stenosis and severe bilateral neural foraminal narrowing. Electronically Signed   By: Ryan Chess M.D.   On:  07/09/2023 12:23   MR THORACIC SPINE W WO CONTRAST Result Date: 07/09/2023 CLINICAL DATA:  Osteomyelitis, cervical; Osteomyelitis, thoracic patient with neck pain to upper back ct with ? infection. EXAM: MRI CERVICAL AND THORACIC SPINE WITHOUT AND WITH CONTRAST TECHNIQUE: Multiplanar and multiecho pulse sequences of the cervical spine, to include the craniocervical junction and cervicothoracic junction, and the thoracic spine, were obtained without and with intravenous contrast. CONTRAST:  7mL GADAVIST  GADOBUTROL  1 MMOL/ML IV SOLN COMPARISON:  CT cervical spine 07/09/2023. FINDINGS: MRI CERVICAL SPINE FINDINGS Alignment: Destruction and collapse of the C6 and C7 vertebral bodies results in slight retrolisthesis of C6 on C7.  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 Trace prevertebral edema. No evidence of epidural abscess. Cord: Severe compression of the spinal cord at the C6-7 level. Posterior Fossa, vertebral arteries, paraspinal tissues: As above. Otherwise, moderate prevertebral phlegmon at C6-7. Disc levels: C2-C3:  Normal. C3-C4: Central disc protrusion and ligamentum flavum buckling results in moderate spinal canal stenosis. Facet arthropathy and uncovertebral joint spurring results in severe bilateral neural foraminal narrowing. C4-C5: Disc bulge and ligamentum flavum buckling results in moderate-to-severe spinal canal stenosis. Facet arthropathy and uncovertebral joint spurring contribute to severe bilateral neural foraminal narrowing. C5-C6: Disc bulge results in mild spinal canal stenosis. Left-greater-than-right facet arthropathy and uncovertebral joint spurring results in severe left and moderate right neural foraminal narrowing. C6-C7: Collapse of the vertebral bodies, retrolisthesis and retropulsion of the posterior cortex of C6 results in severe spinal canal stenosis and severe bilateral neural foraminal narrowing. C7-T1:  Unremarkable. MRI THORACIC SPINE FINDINGS Alignment:  Normal. Vertebrae: No fracture evidence of discitis-osteomyelitis. Acute appearing degenerative Schmorl's nodes at T11-12 and T12-L1. Cord: Normal spinal cord signal and volume. No abnormal enhancement. Paraspinal and other soft tissues: Polycystic kidneys. Disc levels: No significant disc herniation, spinal canal stenosis or neural foraminal narrowing. IMPRESSION: 1. 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  Severe spinal canal stenosis with severe compression of the spinal cord and severe bilateral neural foraminal narrowing at this level. No evidence of epidural abscess. 2. No evidence of discitis-osteomyelitis in the thoracic spine. 3. Acute appearing degenerative Schmorl's nodes at T11-12 and T12-L1. 4. Multilevel cervical spondylosis, worst at C4-5 where there is moderate-to-severe spinal canal stenosis and severe bilateral neural foraminal narrowing. Electronically Signed   By: Ryan Chess M.D.   On: 07/09/2023 12:23   DG Chest 1 View Result Date: 07/09/2023 CLINICAL DATA:  ESRD EXAM: CHEST  1 VIEW COMPARISON:  Chest radiograph dated December 22, 2022. FINDINGS: The heart size and mediastinal contours are within normal limits. Right IJ hemodialysis catheter tip overlies the mid right atrium, similar to the prior exam. Small right-greater-than-left pleural effusions with associated basilar atelectasis. Streaky opacities at the right lung base. Linear scarring/atelectasis in the mid left lung. No definite pneumothorax. Vascular stent in the right upper arm. No acute osseous abnormality. IMPRESSION: 1. Small bilateral right-greater-than-left pleural effusions. 2. Streaky opacities at the right lung base could represent atelectasis or infiltrate. Electronically Signed   By: Harrietta Sherry M.D.   On: 07/09/2023 09:48   CT Cervical Spine Wo Contrast Result Date: 07/09/2023 CLINICAL DATA:  Acute neck pain EXAM: CT CERVICAL SPINE WITHOUT CONTRAST TECHNIQUE: Multidetector CT imaging of the cervical spine was performed without  intravenous contrast. Multiplanar CT image reconstructions were also generated. RADIATION DOSE REDUCTION: This exam was performed according to the departmental dose-optimization program which includes automated exposure control, adjustment of the mA and/or kV according to patient size and/or use of iterative reconstruction technique. COMPARISON:  None Available. FINDINGS: Alignment: There is  mild anterior facet subluxation at C6-7. Skull base and vertebrae: Severe erosive changes of C6 and C7. No acute fracture. The other vertebral bodies are unremarkable. Soft tissues and spinal canal: No prevertebral fluid or swelling. No visible canal hematoma. Disc levels: There is severe spinal canal stenosis at the C6-7 level Upper chest: Biapical pleural thickening with suspected loculated effusions. Other: None. IMPRESSION: 1. Severe erosive changes of C6 and C7 with severe spinal canal stenosis at the C6-7 level. This is concerning for discitis/osteomyelitis. MRI with and without contrast is recommended for further evaluation. 2. Biapical pleural thickening with suspected loculated effusions. Electronically Signed   By: Franky Stanford M.D.   On: 07/09/2023 02:25    Pertinent items noted in HPI and remainder of comprehensive ROS otherwise negative. Blood pressure (!) 181/85, pulse 91, temperature 98 F (36.7 C), temperature source Oral, resp. rate 16, weight 70 kg, SpO2 100%. Patient is awake and alert.  He is oriented and appropriate.  His speech is fluent.  His judgment insight are intact.  Cranial nerve function aside from his blindness is intact.  Motor examination of his extremities reveals good proximal upper extremity strength.  Patient with some moderate triceps weakness bilaterally right greater than left.  4/5 grip and intrinsic strength.  Lower extremity strength 4+/5.  Examination of head ears eyes nose and throat is unremarked.  Chest and abdomen are frail but otherwise benign.  Extremities free from injury or deformity.  Assessment/Plan: Extensive destructive process ongoing at C6 and C7 with vertebral collapse of C6 and C7 vertebral bodies.  There is a central protrusion at the level of the C6-7 disc space causing moderately severe stenosis with some cord compression but no high signal abnormality.  There is prevertebral phlegmon possibly consistent with osteomyelitis discitis.  There is  no evidence of significant epidural abscess.  Certainly the most likely scenario is that of cervical osteomyelitis discitis with destruction of C6 and C7 however the possibility exists that this may be a sterile process and due to renal osteodystrophy.  Recommend checking a CRP and sed rate which I have ordered.  Presumptive treatment with antibiotics.  Patient will eventually require anterior and posterior cervical decompression and fusion for full treatment of his symptoms.  No indication for emergent intervention at present.  Patient may be admitted and placed on IV antibiotics presumptively.  Tentatively plan for surgery on 1/6.  Recommend Aspen collar for immobilization.  May mobilize ad lib.  May continue with normal dialysis regimen.  Victory A Atif Chapple 07/09/2023, 2:07 PM

## 2023-07-09 NOTE — H&P (Signed)
 History and Physical    Patient: Shawn [name] FMW:[id number] DOB: [birth year] DOA: 07/08/2023 DOS: the patient was seen and examined on 07/09/2023 PCP: Hillman Bare, MD  Patient coming from: Home  Chief Complaint:  Chief Complaint  Patient presents with   Torticollis   HPI: Shawn [name] is a 62 y.o. male with medical history significant of ESRD on HD MWF, T2DM with diabetic retinopathy complicated by blindness, HTN, CVA and HLD who presents to the ED for evaluation of leg pain. Patient reports she has had neck pain for the last 2 to 3 weeks. Reports it started his neck and both shoulders. When the pain is severe, it radiates down both forearms all the way to his fingers. The pain is worse when he sits up and slightly improves when he lays down. He has tried IcyHot and Tylenol  without significant relief. He reports occasional dry cough as well as pleuritic chest pain but denies any fevers, chills, nausea, vomiting, dizziness, shortness of breath, dysuria, abdominal pain, hematuria or back pain. 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 He occasionally sees some light in his left eye when outside.  ED course: Initial vitals with temp 97.5, RR 17, HR 78, BP 177/90, SpO2 100% on room air Labs show WBC 6.0, Hgb 11.5, platelet 366, sodium 133, K+ 3.2, glucose 185, BUN/creatinine 20/6.24 CXR shows small bilateral R>L pleural effusions, streaky opacities at the right lung base concerning for possible atelectasis or infiltrate. CT C-spine shows severe erosion at the C6 and C7 with severe spinal canal stenosis at the C6-7 levels concerning for discitis/osteomyelitis. MRI C-spine and T-spine shows vertebral discitis/osteomyelitis at the C6-7 level, severe spinal canal stenosis at this level but no epidural abscess and no discitis/osteomyelitis in the thoracic spine Patient received Robaxin  500 mg for neck  pain Neurosurgery was consulted for evaluation [facility] was consulted for admission  Review of Systems: As mentioned in the history of present illness. All other systems reviewed and are negative. Past Medical History:  Diagnosis Date   Anemia    Arthritis    patient denies   Blind right eye    Complication of anesthesia    Diabetes mellitus    type II   ESRD on hemodialysis [facility])    Dialysis since 2011 MWF   HCAP (healthcare-associated pneumonia) 12/19/2022   Hypertension    Legally blind    No pertinent past medical history    PONV (postoperative nausea and vomiting)    N/V- became dehydrated had to have IV fluids- 01/2015   Shortness of breath dyspnea    when has too fluid   Stroke (HCC) 07/09/2010   date per patient   Syncope    Unspecified cerebral artery occlusion with cerebral infarction 04/02/2013   Past Surgical History:  Procedure Laterality Date   AV FISTULA PLACEMENT Right 05/12/2013   Procedure: INSERTION OF ARTERIOVENOUS (AV) GORE-TEX GRAFT ARM; ULTRASOUND GUIDED;  Surgeon: Redell LITTIE Door, MD;  Location: [facility] OR;  Service: Vascular;  Laterality: Right;   AV FISTULA PLACEMENT Left 01/18/2015   Procedure: INSERTION OF ARTERIOVENOUS GORE-TEX GRAFT LEFT UPPER ARM;  Surgeon: Redell LITTIE Door, MD;  Location: [facility] OR;  Service: Vascular;  Laterality: Left;   COLONOSCOPY  06/2012   EYE SURGERY Bilateral    retina surgery both eyes, cataract surgery both eyes   HERNIA REPAIR     right inguinal   INSERTION OF DIALYSIS CATHETER Right  left arm graft  10/2010   LIGATION ARTERIOVENOUS GORTEX GRAFT Left 05/03/2015   Procedure: LIGATION ARTERIOVENOUS GORTEX GRAFT-LEFT UPPER ARM;  Surgeon: Redell LITTIE Door, MD;  Location: [facility] OR;  Service: Vascular;  Laterality: Left;   Social History:  reports that he has never smoked. He has never used smokeless tobacco. He reports that he does not drink alcohol  and does not use drugs.  No Known Allergies  Family History  Problem Relation Age of Onset    Diabetes Mother    Alzheimer's disease Mother    Cancer Father    Heart disease Father    Cancer Sister    Stroke Sister     Prior to Admission medications   Medication Sig Start Date End Date Taking? Authorizing Provider  amLODipine  (NORVASC ) 5 MG tablet Take 5 mg by mouth daily. 07/11/22   [provider]  aspirin  EC 325 MG tablet Take 325 mg by mouth at bedtime.    [provider]  b complex vitamins tablet Take 1 tablet by mouth at bedtime.    [provider]  brimonidine  (ALPHAGAN ) 0.2 % ophthalmic solution Place 1 drop into both eyes 2 (two) times daily. 11/05/22   [provider]  calcitRIOL  (ROCALTROL ) 0.5 MCG capsule Take 1 capsule (0.5 mcg total) by mouth 3 (three) times a week. Patient taking differently: Take 0.5 mcg by mouth every Monday, Wednesday, and Friday. 06/03/19   Cheryle Page, MD  Darbepoetin Alfa  (ARANESP ) 100 MCG/0.5ML SOSY injection Inject 0.5 mLs (100 mcg total) into the skin every Friday at 6 PM. 12/28/22   Rosario Leatrice FERNS, MD  dorzolamide -timolol  (COSOPT ) 22.3-6.8 MG/ML ophthalmic solution Place 1 drop into both eyes 2 (two) times daily.    [provider]  erythromycin  ophthalmic ointment Place 1 Application into both eyes at bedtime. 12/01/20   [provider]  lanthanum  (FOSRENOL ) 1000 MG chewable tablet Chew 1,000 mg by mouth 3 (three) times daily with meals.    [provider]  latanoprost  (XALATAN ) 0.005 % ophthalmic solution Place 1 drop into both eyes at bedtime.     [provider]  lisinopril  (ZESTRIL ) 20 MG tablet Take 20 mg by mouth 2 (two) times daily. 07/23/22   [provider]  rosuvastatin  (CRESTOR ) 40 MG tablet Take 40 mg by mouth daily. 02/19/22   [provider]  TRESIBA  FLEXTOUCH 100 UNIT/ML FlexTouch Pen Inject 18 Units into the skin at bedtime. 10/04/22   [provider]    Physical Exam: Vitals:   07/09/23 0051 07/09/23 0510 07/09/23 0840  07/09/23 1250  BP: (!) 164/75 (!) 177/90 (!) 186/82 (!) 181/85  Pulse: 73 78 79 91  Resp: 14 17 16 16   Temp: 97.6 F (36.4 C) (!) 97.5 F (36.4 C) (!) 97.5 F (36.4 C) 98 F (36.7 C)  TempSrc: Oral  Oral Oral  SpO2: 100% 100% 100% 100%  Weight:       General: Pleasant, well-appearing man laying in hallway bed with surgical mask on. No acute distress. HEENT: [place]/AT. Legally blind. Clouding of the right cornea. Both pupils are not reactive to light. Neck: Immobilized with c-collar.  CV: RRR. No murmurs, rubs, or gallops. No LE edema Pulmonary: Lungs CTAB. Normal effort. No wheezing or rales. Abdominal: Soft, nontender, nondistended. Normal bowel sounds. Extremities: Palpable radial and DP pulses. Normal ROM. Skin: Warm and dry. No obvious rash or lesions. Neuro: A&Ox3. Moves all extremities. Normal sensation to light touch. No focal deficit. Psych: Normal mood and affect  Dialysis Access: Right IJ TDC stable in right chest  Data Reviewed: Labs show WBC 6.0, Hgb 11.5, platelet 366, sodium 133, K+ 3.2, glucose 185, BUN/creatinine 20/6.24 CXR shows small bilateral R>L pleural effusions, streaky opacities at the right lung base concerning for possible atelectasis or infiltrate. CT C-spine shows severe erosion at the C6 and C7 with severe spinal canal stenosis at the C6-7 levels concerning for discitis/osteomyelitis. MRI C-spine and T-spine shows vertebral discitis/osteomyelitis at the C6-7 level, severe spinal canal stenosis at this level but no epidural abscess and no discitis/osteomyelitis in the thoracic spine  Assessment and Plan: Shawn [name] is a 62 y.o. male with medical history significant of ESRD on HD MWF, T2DM, HTN, CVA and HLD who presents to the ED for evaluation of neck pain and admitted for cervical discitis/osteomyelitis.  # Cervical discitis/osteomyelitis # Severe cervical spinal canal stenosis Patient with a history of ESRD with IJ tunnel catheter for HD on 6 who  presents with 2 weeks of cervical radiculopathy and found to have evidence of cervical discitis/osteomyelitis as well as severe spinal canal stenosis at the C6-7 level. Neurosurgery has been consulted recommends IV antibiotics for now with plan for surgery next week. No white count however he has elevated inflammatory markers with CRP 7.8, and ESR 63 -Neurosurgery consulted, appreciate recs -Start IV vancomycin  and cefepime  -Pain control with PRN Tylenol  and IV Dilaudid  -Follow-up blood cultures, ESR, CRP -Trend CBC, fever curve -Continue Aspen c-collar, may mobilize ad lib  # ESRD on HD MWF States he had a/HD on Sunday on a holiday schedule with plan for HD today. K+ slightly low at 3.2 but no acute indication for urgent HD. Nephrology consulted and plan to resume HD on his regular schedule tomorrow. -Nephrology consulted, pt will resume HD tomorrow -Trend renal function -Avoid nephrotoxic agents  # T2DM Last A1c 6.6% 9 months ago. Regimen includes Tresiba  12 units at bedtime. Blood sugar 185 on BMP. -Start Semglee  10 units at bedtime -SSI with meals, CBG monitoring -Follow-up repeat A1c  # HTN BP elevated with SBP in the 170s-180s -Continue amlodipine  and lisinopril    # Hx of CVA # HLD Last lipid panel 11 months ago showed LDL of 70 -Continue rosuvastatin  and aspirin  -Follow-up repeat lipid panel  # Diabetic retinopathy # Blindness Patient completely blind in his right eye but can see some light in his left. -Continue eyedrops   Advance Care Planning:   Code Status: Full Code   Consults: Neurosurgery, nephrology  Family Communication: Discussed admission with spouse on the phone.  Severity of Illness: The appropriate patient status for this patient is INPATIENT. Inpatient status is judged to be reasonable and necessary in order to provide the required intensity of service to ensure the patient's safety. The patient's presenting symptoms, physical exam findings, and initial  radiographic and laboratory data in the context of their chronic comorbidities is felt to place them at high risk for further clinical deterioration. Furthermore, it is not anticipated that the patient will be medically stable for discharge from the hospital within 2 midnights of admission.   * I certify that at the point of admission it is my clinical judgment that the patient will require inpatient hospital care spanning beyond 2 midnights from the point of admission due to high intensity of service, high risk for further deterioration and high frequency of surveillance required.*  Author: Claretta CHRISTELLA Alderman, MD 07/09/2023 1:48 PM  For on call review [email or web address].

## 2023-07-09 NOTE — ED Provider Notes (Signed)
 [place] [facility]MENT AT [facility] Provider Note   CSN: [id number] Arrival date & time: 07/08/23  2219     History  Chief Complaint  Patient presents with   Torticollis    Shawn [name] is a 62 y.o. male.  HPI 62 year old male end-stage renal disease, on dialysis, history of stroke, hypertension, diabetes presents today with 2 weeks of neck pain.  He describes it as in bilateral trapezius and in the mid neck radiating down to upper back.  He denies any recent trauma, fever, chills, infection.  He states he had some tingling in his right hand but this has gotten better.  He is not having any weakness.  He has had no prior spinal problems or evaluations.  He does go to dialysis normally Monday Wednesday Friday, he was due to be dialyzed today due to the holiday schedule.  He denies any chest pain, shortness of breath, or symptoms of volume overload.     Home Medications Prior to Admission medications   Medication Sig Start Date End Date Taking? Authorizing Provider  amLODipine  (NORVASC ) 5 MG tablet Take 5 mg by mouth daily. 07/11/22   [provider]  aspirin  EC 325 MG tablet Take 325 mg by mouth at bedtime.    [provider]  b complex vitamins tablet Take 1 tablet by mouth at bedtime.    [provider]  brimonidine  (ALPHAGAN ) 0.2 % ophthalmic solution Place 1 drop into both eyes 2 (two) times daily. 11/05/22   [provider]  calcitRIOL  (ROCALTROL ) 0.5 MCG capsule Take 1 capsule (0.5 mcg total) by mouth 3 (three) times a week. Patient taking differently: Take 0.5 mcg by mouth every Monday, Wednesday, and Friday. 06/03/19   Cheryle Page, MD  Darbepoetin Alfa  (ARANESP ) 100 MCG/0.5ML SOSY injection Inject 0.5 mLs (100 mcg total) into the skin every Friday at 6 PM. 12/28/22   Rosario Leatrice FERNS, MD  dorzolamide -timolol  (COSOPT ) 22.3-6.8 MG/ML ophthalmic solution Place 1 drop into both eyes 2 (two) times daily.     [provider]  erythromycin  ophthalmic ointment Place 1 Application into both eyes at bedtime. 12/01/20   [provider]  lanthanum  (FOSRENOL ) 1000 MG chewable tablet Chew 1,000 mg by mouth 3 (three) times daily with meals.    [provider]  latanoprost  (XALATAN ) 0.005 % ophthalmic solution Place 1 drop into both eyes at bedtime.     [provider]  lisinopril  (ZESTRIL ) 20 MG tablet Take 20 mg by mouth 2 (two) times daily. 07/23/22   [provider]  rosuvastatin  (CRESTOR ) 40 MG tablet Take 40 mg by mouth daily. 02/19/22   [provider]  TRESIBA  FLEXTOUCH 100 UNIT/ML FlexTouch Pen Inject 18 Units into the skin at bedtime. 10/04/22   [provider]      Allergies    Patient has no known allergies.    Review of Systems   Review of Systems  Physical Exam Updated Vital Signs BP (!) 181/85 (BP Location: Right Arm)   Pulse 91   Temp 98 F (36.7 C) (Oral)   Resp 16   Wt 70 kg   SpO2 100%   BMI 21.52 kg/m  Physical Exam Vitals reviewed.  HENT:     Head: Normocephalic.     Right Ear: External ear normal.     Left Ear: External ear normal.     Nose: Nose normal.  Eyes:     Extraocular Movements: Extraocular movements intact.     Pupils:  Pupils are equal, round, and reactive to light.  Cardiovascular:     Rate and Rhythm: Normal rate and regular rhythm.     Pulses: Normal pulses.  Pulmonary:     Effort: Pulmonary effort is normal.     Breath sounds: Normal breath sounds.  Abdominal:     Palpations: Abdomen is soft.  Musculoskeletal:     Cervical back: Normal range of motion.     Comments: Neck visually examined with prominent C7 spinous process, no redness, warmth, swelling, tenderness to palpation Thoracic spine visually inspected with no obvious signs of trauma, redness, no tenderness to palpation Lumbar spine appears normal  Skin:    General: Skin is warm and dry.     Capillary Refill: Capillary refill  takes less than 2 seconds.  Neurological:     General: No focal deficit present.     Mental Status: He is alert and oriented to person, place, and time.     Comments: No palmar drift No sensory or motor deficit appreciated on exam  Psychiatric:        Mood and Affect: Mood normal.        Behavior: Behavior normal.     ED Results / Procedures / Treatments   Labs (all labs ordered are listed, but only abnormal results are displayed) Labs Reviewed  CBC - Abnormal; Notable for the following components:      Result Value   Hemoglobin 11.5 (*)    HCT 36.9 (*)    RDW 17.0 (*)    All other components within normal limits  BASIC METABOLIC PANEL - Abnormal; Notable for the following components:   Sodium 133 (*)    Potassium 3.2 (*)    Chloride 95 (*)    Glucose, Bld 185 (*)    Creatinine, Ser 6.24 (*)    GFR, Estimated 9 (*)    All other components within normal limits  SEDIMENTATION RATE  C-REACTIVE PROTEIN    EKG None  Radiology MR Cervical Spine W and Wo Contrast Result Date: 07/09/2023 CLINICAL DATA:  Osteomyelitis, cervical; Osteomyelitis, thoracic patient with neck pain to upper back ct with ? infection. EXAM: MRI CERVICAL AND THORACIC SPINE WITHOUT AND WITH CONTRAST TECHNIQUE: Multiplanar and multiecho pulse sequences of the cervical spine, to include the craniocervical junction and cervicothoracic junction, and the thoracic spine, were obtained without and with intravenous contrast. CONTRAST:  7mL GADAVIST  GADOBUTROL  1 MMOL/ML IV SOLN COMPARISON:  CT cervical spine 07/09/2023. FINDINGS: MRI CERVICAL SPINE FINDINGS Alignment: Destruction and collapse of the C6 and C7 vertebral bodies results in slight retrolisthesis of C6 on C7. 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 Trace prevertebral  edema. No evidence of epidural abscess. Cord: Severe compression of the spinal cord at the C6-7 level. Posterior Fossa, vertebral arteries, paraspinal tissues: As above. Otherwise, moderate prevertebral phlegmon at C6-7. Disc levels: C2-C3:  Normal. C3-C4: Central disc protrusion and ligamentum flavum buckling results in moderate spinal canal stenosis. Facet arthropathy and uncovertebral joint spurring results in severe bilateral neural foraminal narrowing. C4-C5: Disc bulge and ligamentum flavum buckling results in moderate-to-severe spinal canal stenosis. Facet arthropathy and uncovertebral joint spurring contribute to severe bilateral neural foraminal narrowing. C5-C6: Disc bulge results in mild spinal canal stenosis. Left-greater-than-right facet arthropathy and uncovertebral joint spurring results in severe left and moderate right neural foraminal narrowing. C6-C7: Collapse of the vertebral  bodies, retrolisthesis and retropulsion of the posterior cortex of C6 results in severe spinal canal stenosis and severe bilateral neural foraminal narrowing. C7-T1:  Unremarkable. MRI THORACIC SPINE FINDINGS Alignment:  Normal. Vertebrae: No fracture evidence of discitis-osteomyelitis. Acute appearing degenerative Schmorl's nodes at T11-12 and T12-L1. Cord: Normal spinal cord signal and volume. No abnormal enhancement. Paraspinal and other soft tissues: Polycystic kidneys. Disc levels: No significant disc herniation, spinal canal stenosis or neural foraminal narrowing. IMPRESSION: 1. 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 Severe spinal canal stenosis with severe compression of the spinal cord and severe bilateral neural foraminal narrowing at this level. No evidence of epidural abscess. 2. No evidence of discitis-osteomyelitis in the thoracic spine. 3. Acute appearing  degenerative Schmorl's nodes at T11-12 and T12-L1. 4. Multilevel cervical spondylosis, worst at C4-5 where there is moderate-to-severe spinal canal stenosis and severe bilateral neural foraminal narrowing. Electronically Signed   By: Ryan Chess M.D.   On: 07/09/2023 12:23   MR THORACIC SPINE W WO CONTRAST Result Date: 07/09/2023 CLINICAL DATA:  Osteomyelitis, cervical; Osteomyelitis, thoracic patient with neck pain to upper back ct with ? infection. EXAM: MRI CERVICAL AND THORACIC SPINE WITHOUT AND WITH CONTRAST TECHNIQUE: Multiplanar and multiecho pulse sequences of the cervical spine, to include the craniocervical junction and cervicothoracic junction, and the thoracic spine, were obtained without and with intravenous contrast. CONTRAST:  7mL GADAVIST  GADOBUTROL  1 MMOL/ML IV SOLN COMPARISON:  CT cervical spine 07/09/2023. FINDINGS: MRI CERVICAL SPINE FINDINGS Alignment: Destruction and collapse of the C6 and C7 vertebral bodies results in slight retrolisthesis of C6 on C7. 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 Trace prevertebral edema. No evidence of epidural abscess. Cord: Severe compression of the spinal cord at the C6-7 level. Posterior Fossa, vertebral arteries, paraspinal tissues: As above. Otherwise, moderate prevertebral phlegmon at C6-7. Disc levels: C2-C3:  Normal. C3-C4: Central disc protrusion and ligamentum flavum buckling results in moderate spinal canal stenosis. Facet arthropathy and uncovertebral joint spurring results in severe bilateral neural foraminal narrowing. C4-C5: Disc bulge and ligamentum flavum buckling results in moderate-to-severe spinal canal stenosis. Facet arthropathy and uncovertebral joint spurring contribute to severe bilateral neural foraminal narrowing. C5-C6: Disc bulge  results in mild spinal canal stenosis. Left-greater-than-right facet arthropathy and uncovertebral joint spurring results in severe left and moderate right neural foraminal narrowing. C6-C7: Collapse of the vertebral bodies, retrolisthesis and retropulsion of the posterior cortex of C6 results in severe spinal canal stenosis and severe bilateral neural foraminal narrowing. C7-T1:  Unremarkable. MRI THORACIC SPINE FINDINGS Alignment:  Normal. Vertebrae: No fracture evidence of discitis-osteomyelitis. Acute appearing degenerative Schmorl's nodes at T11-12 and T12-L1. Cord: Normal spinal cord signal and volume. No abnormal enhancement. Paraspinal and other soft tissues: Polycystic kidneys. Disc levels: No significant disc herniation, spinal canal stenosis or neural foraminal narrowing. IMPRESSION: 1. 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 Severe spinal canal stenosis with severe compression of the spinal cord and severe bilateral neural foraminal narrowing at this level. No evidence of epidural abscess. 2. No evidence of discitis-osteomyelitis in the thoracic spine. 3. Acute appearing degenerative Schmorl's nodes at T11-12 and T12-L1. 4. Multilevel cervical spondylosis, worst at C4-5 where there  is moderate-to-severe spinal canal stenosis and severe bilateral neural foraminal narrowing. Electronically Signed   By: Ryan Chess M.D.   On: 07/09/2023 12:23   DG Chest 1 View Result Date: 07/09/2023 CLINICAL DATA:  ESRD EXAM: CHEST  1 VIEW COMPARISON:  Chest radiograph dated December 22, 2022. FINDINGS: The heart size and mediastinal contours are within normal limits. Right IJ hemodialysis catheter tip overlies the mid right atrium, similar to the prior exam. Small right-greater-than-left pleural effusions with associated basilar atelectasis. Streaky opacities at the  right lung base. Linear scarring/atelectasis in the mid left lung. No definite pneumothorax. Vascular stent in the right upper arm. No acute osseous abnormality. IMPRESSION: 1. Small bilateral right-greater-than-left pleural effusions. 2. Streaky opacities at the right lung base could represent atelectasis or infiltrate. Electronically Signed   By: Harrietta Sherry M.D.   On: 07/09/2023 09:48   CT Cervical Spine Wo Contrast Result Date: 07/09/2023 CLINICAL DATA:  Acute neck pain EXAM: CT CERVICAL SPINE WITHOUT CONTRAST TECHNIQUE: Multidetector CT imaging of the cervical spine was performed without intravenous contrast. Multiplanar CT image reconstructions were also generated. RADIATION DOSE REDUCTION: This exam was performed according to the departmental dose-optimization program which includes automated exposure control, adjustment of the mA and/or kV according to patient size and/or use of iterative reconstruction technique. COMPARISON:  None Available. FINDINGS: Alignment: There is mild anterior facet subluxation at C6-7. Skull base and vertebrae: Severe erosive changes of C6 and C7. No acute fracture. The other vertebral bodies are unremarkable. Soft tissues and spinal canal: No prevertebral fluid or swelling. No visible canal hematoma. Disc levels: There is severe spinal canal stenosis at the C6-7 level Upper chest: Biapical pleural thickening with suspected loculated effusions. Other: None. IMPRESSION: 1. Severe erosive changes of C6 and C7 with severe spinal canal stenosis at the C6-7 level. This is concerning for discitis/osteomyelitis. MRI with and without contrast is recommended for further evaluation. 2. Biapical pleural thickening with suspected loculated effusions. Electronically Signed   By: Franky Stanford M.D.   On: 07/09/2023 02:25    Procedures Procedures    Medications Ordered in ED Medications  methocarbamol  (ROBAXIN ) tablet 500 mg (500 mg Oral Given 07/08/23 2251)  gadobutrol   (GADAVIST ) 1 MMOL/ML injection 7 mL (7 mLs Intravenous Contrast Given 07/09/23 1015)    ED Course/ Medical Decision Making/ A&P Clinical Course as of 07/09/23 1402  Tue Jul 09, 2023  1244 Reviewed MRI that she states endings of vertebral discitis/osteomyelitis at C6-C7 with destruction of endplates vertebral body height loss, retrolisthesis and retropulsion of the posterior cortex of C6.  Severe spinal canal stenosis with severe compression of the spinal cord and severe bilateral neural foraminal narrowing at this level no evidence of epidural abscess.  No evidence of discitis osteomyelitis in the thoracic spine [DR]  1245 CBC reviewed interpreted and normal [DR]  1245 Metabolic panel reviewed interpreted send for sodium of 133, potassium 3.2, creatinine elevated at 6.24 [DR]    Clinical Course User Index [DR] Levander Houston, MD                                 Medical Decision Making Amount and/or Complexity of Data Reviewed Labs: ordered. Radiology: ordered.  Risk Prescription drug management.  62 year old male presents today with neck pain.  He has MRI that is significant for discitis/osteomyelitis at C6-7 with severe canal narrowing. He does not have acute neurological deficit on exam Neurosurgery has been  paged Patient has known renal failure scheduled for dialysis today.  His creatinine is elevated.  Potassium is low at 3.2 Will alert nephrology Discussed with neurosurgery, they will see patient and evaluate for surgery.  They request hospitalist be consulted for admission Dr. Malcolm saw and evaluated patient        Final Clinical Impression(s) / ED Diagnoses Final diagnoses:  Osteomyelitis of cervical spine (HCC)  Spinal stenosis, unspecified spinal region  ESRD (end stage renal disease) (HCC)    Rx / DC Orders ED Discharge Orders     None         Levander Houston, MD 07/09/23 1402

## 2023-07-09 NOTE — Consult Note (Signed)
 [facility] Renal Consultation Note    Indication for Consultation:  Management of ESRD/hemodialysis, anemia, hypertension/volume, and secondary hyperparathyroidism.  HPI: Shawn [name] is a 62 y.o. male with PMH including ESRD on dialysis, T2DM, prior CVA and blindness who presented to the ED with neck pain. Patient reports he has had neck pain off and on for a few weeks but it usually improved with tylenol  and icy hot. Today while he was getting ready for dialysis, he felt like it locked up, and was more severe. He reports he had a low grade fever this AM but had not had fever, chills or malaise prior to this.  CT and MRI concerning for cervical osteomyelitis discitis. Neurosurgery was consulted, started empiric antibiotics with plan for possible surgery on 07/14/22. Nephrology was consulted for management of ESRD.  Patient typically dialyzes on MWF schedule, last HD was Sunday due to holiday schedule. He reports he was close to his EDW on Sunday and has not really had much to eat or drink since then. He denies any SOB except when he takes a deep breath, but reports this is due to neck pain. Denies orthopnea. Denies CP, palpitations, dizziness, abdominal pain, N/V/D, dysuria, flank pain, edema. He is TDC dependent, reports multiple accesses in both arms failed and he has a history of steal syndrome as well. Reports he has had this catheter for years, no recent pain/drainage from exit site.   Past Medical History:  Diagnosis Date   Anemia    Arthritis    patient denies   Blind right eye    Complication of anesthesia    Diabetes mellitus    type II   ESRD on hemodialysis [facility])    Dialysis since 2011 MWF   HCAP (healthcare-associated pneumonia) 12/19/2022   Hypertension    Legally blind    No pertinent past medical history    PONV (postoperative nausea and vomiting)    N/V- became dehydrated had to have IV fluids- 01/2015   Shortness of breath dyspnea    when has too  fluid   Stroke (HCC) 07/09/2010   date per patient   Syncope    Unspecified cerebral artery occlusion with cerebral infarction 04/02/2013   Past Surgical History:  Procedure Laterality Date   AV FISTULA PLACEMENT Right 05/12/2013   Procedure: INSERTION OF ARTERIOVENOUS (AV) GORE-TEX GRAFT ARM; ULTRASOUND GUIDED;  Surgeon: Redell LITTIE Door, MD;  Location: [facility] OR;  Service: Vascular;  Laterality: Right;   AV FISTULA PLACEMENT Left 01/18/2015   Procedure: INSERTION OF ARTERIOVENOUS GORE-TEX GRAFT LEFT UPPER ARM;  Surgeon: Redell LITTIE Door, MD;  Location: [facility] OR;  Service: Vascular;  Laterality: Left;   COLONOSCOPY  06/2012   EYE SURGERY Bilateral    retina surgery both eyes, cataract surgery both eyes   HERNIA REPAIR     right inguinal   INSERTION OF DIALYSIS CATHETER Right    left arm graft  10/2010   LIGATION ARTERIOVENOUS GORTEX GRAFT Left 05/03/2015   Procedure: LIGATION ARTERIOVENOUS GORTEX GRAFT-LEFT UPPER ARM;  Surgeon: Redell LITTIE Door, MD;  Location: [facility] OR;  Service: Vascular;  Laterality: Left;   Family History  Problem Relation Age of Onset   Diabetes Mother    Alzheimer's disease Mother    Cancer Father    Heart disease Father    Cancer Sister    Stroke Sister    Social History:  reports that he has never smoked. He has never used smokeless tobacco. He reports that he does not  drink alcohol  and does not use drugs.  ROS: As per HPI otherwise negative.  Physical Exam: Vitals:   07/09/23 0051 07/09/23 0510 07/09/23 0840 07/09/23 1250  BP: (!) 164/75 (!) 177/90 (!) 186/82 (!) 181/85  Pulse: 73 78 79 91  Resp: 14 17 16 16   Temp: 97.6 F (36.4 C) (!) 97.5 F (36.4 C) (!) 97.5 F (36.4 C) 98 F (36.7 C)  TempSrc: Oral  Oral Oral  SpO2: 100% 100% 100% 100%  Weight:         General: Well developed, well nourished, in no acute distress. Head: Normocephalic, atraumatic, sclera non-icteric, mucus membranes are moist. Lungs: Clear bilaterally to auscultation without wheezes, rales, or  rhonchi. Breathing is unlabored. Heart: RRR with normal S1, S2. No murmurs, rubs, or gallops appreciated. Abdomen: Soft, non-tender, non-distended with normoactive bowel sounds.  Musculoskeletal:  Strength and tone appear normal for age. Lower extremities: No edema or ischemic changes, no open wounds. Neuro: Alert and oriented X 3. Moves all extremities spontaneously. Psych:  Responds to questions appropriately with a normal affect. Dialysis Access: R internal jugular TDC  No Known Allergies Prior to Admission medications   Medication Sig Start Date End Date Taking? Authorizing Provider  amLODipine  (NORVASC ) 5 MG tablet Take 5 mg by mouth daily. 07/11/22   [provider]  aspirin  EC 325 MG tablet Take 325 mg by mouth at bedtime.    [provider]  b complex vitamins tablet Take 1 tablet by mouth at bedtime.    [provider]  brimonidine  (ALPHAGAN ) 0.2 % ophthalmic solution Place 1 drop into both eyes 2 (two) times daily. 11/05/22   [provider]  calcitRIOL  (ROCALTROL ) 0.5 MCG capsule Take 1 capsule (0.5 mcg total) by mouth 3 (three) times a week. Patient taking differently: Take 0.5 mcg by mouth every Monday, Wednesday, and Friday. 06/03/19   Cheryle Page, MD  Darbepoetin Alfa  (ARANESP ) 100 MCG/0.5ML SOSY injection Inject 0.5 mLs (100 mcg total) into the skin every Friday at 6 PM. 12/28/22   Rosario Leatrice FERNS, MD  dorzolamide -timolol  (COSOPT ) 22.3-6.8 MG/ML ophthalmic solution Place 1 drop into both eyes 2 (two) times daily.    [provider]  erythromycin  ophthalmic ointment Place 1 Application into both eyes at bedtime. 12/01/20   [provider]  lanthanum  (FOSRENOL ) 1000 MG chewable tablet Chew 1,000 mg by mouth 3 (three) times daily with meals.    [provider]  latanoprost  (XALATAN ) 0.005 % ophthalmic solution Place 1 drop into both eyes at bedtime.     [provider]  lisinopril  (ZESTRIL ) 20 MG tablet Take  20 mg by mouth 2 (two) times daily. 07/23/22   [provider]  rosuvastatin  (CRESTOR ) 40 MG tablet Take 40 mg by mouth daily. 02/19/22   [provider]  TRESIBA  FLEXTOUCH 100 UNIT/ML FlexTouch Pen Inject 18 Units into the skin at bedtime. 10/04/22   [provider]   Current Facility-Administered Medications  Medication Dose Route Frequency Provider Last Rate Last Admin   HYDROmorphone  (DILAUDID ) injection 0.5 mg  0.5 mg Intravenous Q6H PRN Amponsah, Prosper M, MD       Current Outpatient Medications  Medication Sig Dispense Refill   amLODipine  (NORVASC ) 5 MG tablet Take 5 mg by mouth daily.     aspirin  EC 325 MG tablet Take 325 mg by mouth at bedtime.     b complex vitamins tablet Take 1 tablet by mouth at bedtime.     brimonidine  (ALPHAGAN ) 0.2 %  ophthalmic solution Place 1 drop into both eyes 2 (two) times daily.     calcitRIOL  (ROCALTROL ) 0.5 MCG capsule Take 1 capsule (0.5 mcg total) by mouth 3 (three) times a week. (Patient taking differently: Take 0.5 mcg by mouth every Monday, Wednesday, and Friday.) 30 capsule 0   Darbepoetin Alfa  (ARANESP ) 100 MCG/0.5ML SOSY injection Inject 0.5 mLs (100 mcg total) into the skin every Friday at 6 PM. 4.2 mL 0   dorzolamide -timolol  (COSOPT ) 22.3-6.8 MG/ML ophthalmic solution Place 1 drop into both eyes 2 (two) times daily.     erythromycin  ophthalmic ointment Place 1 Application into both eyes at bedtime.     lanthanum  (FOSRENOL ) 1000 MG chewable tablet Chew 1,000 mg by mouth 3 (three) times daily with meals.     latanoprost  (XALATAN ) 0.005 % ophthalmic solution Place 1 drop into both eyes at bedtime.      lisinopril  (ZESTRIL ) 20 MG tablet Take 20 mg by mouth 2 (two) times daily.     rosuvastatin  (CRESTOR ) 40 MG tablet Take 40 mg by mouth daily.     TRESIBA  FLEXTOUCH 100 UNIT/ML FlexTouch Pen Inject 18 Units into the skin at bedtime.     Labs: Basic Metabolic Panel: Recent Labs  Lab 07/09/23 1150  NA 133*  K 3.2*  CL  95*  CO2 23  GLUCOSE 185*  BUN 20  CREATININE 6.24*  CALCIUM  9.2   Liver Function Tests: No results for input(s): AST, ALT, ALKPHOS, BILITOT, PROT, ALBUMIN  in the last 168 hours. No results for input(s): LIPASE, AMYLASE in the last 168 hours. No results for input(s): AMMONIA in the last 168 hours. CBC: Recent Labs  Lab 07/09/23 1150  WBC 6.0  HGB 11.5*  HCT 36.9*  MCV 84.4  PLT 363   Cardiac Enzymes: No results for input(s): CKTOTAL, CKMB, CKMBINDEX, TROPONINI in the last 168 hours. CBG: No results for input(s): GLUCAP in the last 168 hours. Iron Studies: No results for input(s): IRON, TIBC, TRANSFERRIN, FERRITIN in the last 72 hours. Studies/Results: MR Cervical Spine W and Wo Contrast Result Date: 07/09/2023 CLINICAL DATA:  Osteomyelitis, cervical; Osteomyelitis, thoracic patient with neck pain to upper back ct with ? infection. EXAM: MRI CERVICAL AND THORACIC SPINE WITHOUT AND WITH CONTRAST TECHNIQUE: Multiplanar and multiecho pulse sequences of the cervical spine, to include the craniocervical junction and cervicothoracic junction, and the thoracic spine, were obtained without and with intravenous contrast. CONTRAST:  7mL GADAVIST  GADOBUTROL  1 MMOL/ML IV SOLN COMPARISON:  CT cervical spine 07/09/2023. FINDINGS: MRI CERVICAL SPINE FINDINGS Alignment: Destruction and collapse of the C6 and C7 vertebral bodies results in slight retrolisthesis of C6 on C7. 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 Trace prevertebral edema. No evidence of epidural abscess. Cord: Severe compression of the spinal cord at the C6-7 level. Posterior Fossa, vertebral arteries, paraspinal tissues: As above. Otherwise, moderate prevertebral phlegmon at C6-7. Disc levels: C2-C3:  Normal.  C3-C4: Central disc protrusion and ligamentum flavum buckling results in moderate spinal canal stenosis. Facet arthropathy and uncovertebral joint spurring results in severe bilateral neural foraminal narrowing. C4-C5: Disc bulge and ligamentum flavum buckling results in moderate-to-severe spinal canal stenosis. Facet arthropathy and uncovertebral joint spurring contribute to severe bilateral neural foraminal narrowing. C5-C6: Disc bulge results in mild spinal canal stenosis. Left-greater-than-right facet arthropathy and uncovertebral joint spurring results in severe left and moderate right neural foraminal narrowing.  C6-C7: Collapse of the vertebral bodies, retrolisthesis and retropulsion of the posterior cortex of C6 results in severe spinal canal stenosis and severe bilateral neural foraminal narrowing. C7-T1:  Unremarkable. MRI THORACIC SPINE FINDINGS Alignment:  Normal. Vertebrae: No fracture evidence of discitis-osteomyelitis. Acute appearing degenerative Schmorl's nodes at T11-12 and T12-L1. Cord: Normal spinal cord signal and volume. No abnormal enhancement. Paraspinal and other soft tissues: Polycystic kidneys. Disc levels: No significant disc herniation, spinal canal stenosis or neural foraminal narrowing. IMPRESSION: 1. 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 Severe spinal canal stenosis with severe compression of the spinal cord and severe bilateral neural foraminal narrowing at this level. No evidence of epidural abscess. 2. No evidence of discitis-osteomyelitis in the thoracic spine. 3. Acute appearing degenerative Schmorl's nodes at T11-12 and T12-L1. 4. Multilevel cervical spondylosis, worst at C4-5 where there is moderate-to-severe spinal canal stenosis and severe bilateral neural foraminal narrowing. Electronically Signed   By: Ryan Chess  M.D.   On: 07/09/2023 12:23   MR THORACIC SPINE W WO CONTRAST Result Date: 07/09/2023 CLINICAL DATA:  Osteomyelitis, cervical; Osteomyelitis, thoracic patient with neck pain to upper back ct with ? infection. EXAM: MRI CERVICAL AND THORACIC SPINE WITHOUT AND WITH CONTRAST TECHNIQUE: Multiplanar and multiecho pulse sequences of the cervical spine, to include the craniocervical junction and cervicothoracic junction, and the thoracic spine, were obtained without and with intravenous contrast. CONTRAST:  7mL GADAVIST  GADOBUTROL  1 MMOL/ML IV SOLN COMPARISON:  CT cervical spine 07/09/2023. FINDINGS: MRI CERVICAL SPINE FINDINGS Alignment: Destruction and collapse of the C6 and C7 vertebral bodies results in slight retrolisthesis of C6 on C7. 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 Trace prevertebral edema. No evidence of epidural abscess. Cord: Severe compression of the spinal cord at the C6-7 level. Posterior Fossa, vertebral arteries, paraspinal tissues: As above. Otherwise, moderate prevertebral phlegmon at C6-7. Disc levels: C2-C3:  Normal. C3-C4: Central disc protrusion and ligamentum flavum buckling results in moderate spinal canal stenosis. Facet arthropathy and uncovertebral joint spurring results in severe bilateral neural foraminal narrowing. C4-C5: Disc bulge and ligamentum flavum buckling results in moderate-to-severe spinal canal stenosis. Facet arthropathy and uncovertebral joint spurring contribute to severe bilateral neural foraminal narrowing. C5-C6: Disc bulge results in mild spinal canal stenosis. Left-greater-than-right facet arthropathy and uncovertebral joint spurring results in severe left and moderate right neural foraminal narrowing. C6-C7: Collapse of the vertebral bodies, retrolisthesis and  retropulsion of the posterior cortex of C6 results in severe spinal canal stenosis and severe bilateral neural foraminal narrowing. C7-T1:  Unremarkable. MRI THORACIC SPINE FINDINGS Alignment:  Normal. Vertebrae: No fracture evidence of discitis-osteomyelitis. Acute appearing degenerative Schmorl's nodes at T11-12 and T12-L1. Cord: Normal spinal cord signal and volume. No abnormal enhancement. Paraspinal and other soft tissues: Polycystic kidneys. Disc levels: No significant disc herniation, spinal canal stenosis or neural foraminal narrowing. IMPRESSION: 1. 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 Severe spinal canal stenosis with severe compression of the spinal cord and severe bilateral neural foraminal narrowing at this level. No evidence of epidural abscess. 2. No evidence of discitis-osteomyelitis in the thoracic spine. 3. Acute appearing degenerative Schmorl's nodes at T11-12 and T12-L1. 4. Multilevel cervical spondylosis,  worst at C4-5 where there is moderate-to-severe spinal canal stenosis and severe bilateral neural foraminal narrowing. Electronically Signed   By: Ryan Chess M.D.   On: 07/09/2023 12:23   DG Chest 1 View Result Date: 07/09/2023 CLINICAL DATA:  ESRD EXAM: CHEST  1 VIEW COMPARISON:  Chest radiograph dated December 22, 2022. FINDINGS: The heart size and mediastinal contours are within normal limits. Right IJ hemodialysis catheter tip overlies the mid right atrium, similar to the prior exam. Small right-greater-than-left pleural effusions with associated basilar atelectasis. Streaky opacities at the right lung base. Linear scarring/atelectasis in the mid left lung. No definite pneumothorax. Vascular stent in the right upper arm. No acute osseous abnormality. IMPRESSION: 1. Small bilateral right-greater-than-left pleural effusions. 2.  Streaky opacities at the right lung base could represent atelectasis or infiltrate. Electronically Signed   By: Harrietta Sherry M.D.   On: 07/09/2023 09:48   CT Cervical Spine Wo Contrast Result Date: 07/09/2023 CLINICAL DATA:  Acute neck pain EXAM: CT CERVICAL SPINE WITHOUT CONTRAST TECHNIQUE: Multidetector CT imaging of the cervical spine was performed without intravenous contrast. Multiplanar CT image reconstructions were also generated. RADIATION DOSE REDUCTION: This exam was performed according to the departmental dose-optimization program which includes automated exposure control, adjustment of the mA and/or kV according to patient size and/or use of iterative reconstruction technique. COMPARISON:  None Available. FINDINGS: Alignment: There is mild anterior facet subluxation at C6-7. Skull base and vertebrae: Severe erosive changes of C6 and C7. No acute fracture. The other vertebral bodies are unremarkable. Soft tissues and spinal canal: No prevertebral fluid or swelling. No visible canal hematoma. Disc levels: There is severe spinal canal stenosis at the C6-7 level Upper chest: Biapical pleural thickening with suspected loculated effusions. Other: None. IMPRESSION: 1. Severe erosive changes of C6 and C7 with severe spinal canal stenosis at the C6-7 level. This is concerning for discitis/osteomyelitis. MRI with and without contrast is recommended for further evaluation. 2. Biapical pleural thickening with suspected loculated effusions. Electronically Signed   By: Franky Stanford M.D.   On: 07/09/2023 02:25    Dialysis Orders:  Center: [facility]  on MWF. 180Nre 3hr 30 min BFR 400 DFR Auto 1.5 EDW 69.9kg 2K 2.5Ca TDC Heparin  6500 unit bolus and 2900 unit bolus mid HD Mircera 120mcg IV q 2 weeks- last dose 06/30/23 Calcitriol  0.5mcg PO q HD  Assessment/Plan:  Neck pain: Imaging concerning for osteomyelitis/discitis. Neurosurgery following, started empiric antibiotics with plan for possible  surgery next week. Patient does have a TDC which is a possible source of infection, will add blood cultures.  ESRD:  On dialysis MWF, last HD was Sunday. No indications for HD today, he appears euvolemic, K+ 3.2 and BUN 20. Will plan for HD tomorrow and continue MWF schedule.  Hypertension/volume: BP elevated but may be partially due to pain. Has been leaving under his EDW lately, likely has lost some body weight. UF with HD as tolerated  Anemia: Hgb 11.5. ESA recently dosed. Holding IV Fe in setting of acute infection. Recommend resuming home BP meds (amlodipine  and lisinopril ).   Metabolic bone disease: Calcium  controlled, recent phos and PTH both at goal. Continue calcitriol  and binders once tolerating PO  T2DM: With retinopathy. Management per primary team  Lucie Collet, PA-C 07/09/2023, 2:21 PM  [facility] Pager: [phone]

## 2023-07-09 NOTE — ED Notes (Signed)
Uses cane. Cane, shoes, belongings with pt.

## 2023-07-10 DIAGNOSIS — I120 Hypertensive chronic kidney disease with stage 5 chronic kidney disease or end stage renal disease: Secondary | ICD-10-CM | POA: Diagnosis not present

## 2023-07-10 DIAGNOSIS — N186 End stage renal disease: Secondary | ICD-10-CM | POA: Diagnosis not present

## 2023-07-10 DIAGNOSIS — Z992 Dependence on renal dialysis: Secondary | ICD-10-CM | POA: Diagnosis not present

## 2023-07-10 DIAGNOSIS — M4622 Osteomyelitis of vertebra, cervical region: Secondary | ICD-10-CM | POA: Diagnosis not present

## 2023-07-10 LAB — HEMOGLOBIN A1C
Hgb A1c MFr Bld: 5.8 % — ABNORMAL HIGH (ref 4.8–5.6)
Mean Plasma Glucose: 120 mg/dL

## 2023-07-10 LAB — BLOOD CULTURE ID PANEL (REFLEXED) - BCID2

## 2023-07-10 LAB — GLUCOSE, CAPILLARY
Glucose-Capillary: 138 mg/dL — ABNORMAL HIGH (ref 70–99)
Glucose-Capillary: 82 mg/dL (ref 70–99)
Glucose-Capillary: 115 mg/dL — ABNORMAL HIGH (ref 70–99)
Glucose-Capillary: 131 mg/dL — ABNORMAL HIGH (ref 70–99)

## 2023-07-10 LAB — HEPATITIS B SURFACE ANTIBODY, QUANTITATIVE: Hep B S AB Quant (Post): 36.8 m[IU]/mL

## 2023-07-10 MED ORDER — CALCITRIOL 0.5 MCG PO CAPS
0.5000 ug | ORAL_CAPSULE | ORAL | Status: DC
  Administered 2023-07-10 – 2023-07-17 (×4): 0.5 ug via ORAL
  Filled 2023-07-10 (×8): qty 1

## 2023-07-10 MED ORDER — ATORVASTATIN CALCIUM 80 MG PO TABS
80.0000 mg | ORAL_TABLET | Freq: Every day | ORAL | Status: DC
  Administered 2023-07-11 – 2023-07-18 (×8): 80 mg via ORAL
  Filled 2023-07-10 (×9): qty 1

## 2023-07-10 MED ORDER — HEPARIN SODIUM (PORCINE) 1000 UNIT/ML IJ SOLN
1000.0000 [IU] | Freq: Once | INTRAMUSCULAR | Status: AC
  Administered 2023-07-10: 4800 [IU]

## 2023-07-10 NOTE — Plan of Care (Signed)

## 2023-07-10 NOTE — Procedures (Signed)
 I was present at this dialysis session. I have reviewed the session itself and made appropriate changes.   Vital signs in last 24 hours:  Temp:  [97.8 F (36.6 C)-98.6 F (37 C)] 97.8 F (36.6 C) (01/01 0813) Pulse Rate:  [74-91] 79 (01/01 0900) Resp:  [7-19] 7 (01/01 0900) BP: (144-181)/(67-85) 145/68 (01/01 0900) SpO2:  [99 %-100 %] 100 % (01/01 0900) Weight:  [67.5 kg-69.9 kg] 67.5 kg (01/01 0810) Weight change: -0.146 kg Filed Weights   07/09/23 1511 07/09/23 1822 07/10/23 0810  Weight: 69.9 kg 67.6 kg 67.5 kg    Recent Labs  Lab 07/09/23 1920  NA 135  K 3.3*  CL 97*  CO2 24  GLUCOSE 97  BUN 23  CREATININE 6.82*  CALCIUM  8.6*  PHOS 3.9    Recent Labs  Lab 07/09/23 1150 07/09/23 1920  WBC 6.0 5.6  HGB 11.5* 9.7*  HCT 36.9* 30.9*  MCV 84.4 84.4  PLT 363 340    Scheduled Meds:  amLODipine   5 mg Oral Daily   aspirin  EC  325 mg Oral QHS   brimonidine   1 drop Both Eyes BID   calcitRIOL   0.5 mcg Oral Q M,W,F   Chlorhexidine  Gluconate Cloth  6 each Topical Q0600   dorzolamide -timolol   1 drop Both Eyes BID   heparin   5,000 Units Subcutaneous Q8H   insulin  aspart  0-9 Units Subcutaneous TID WC   insulin  glargine-yfgn  10 Units Subcutaneous QHS   lanthanum   1,000 mg Oral TID WC   latanoprost   1 drop Both Eyes QHS   lisinopril   20 mg Oral BID   rosuvastatin   40 mg Oral Daily   Continuous Infusions:  anticoagulant sodium citrate      ceFEPime  (MAXIPIME ) IV Stopped (07/09/23 1744)   vancomycin      PRN Meds:.acetaminophen  **OR** acetaminophen , alteplase , anticoagulant sodium citrate , heparin , HYDROmorphone  (DILAUDID ) injection, lidocaine  (PF), lidocaine -prilocaine , ondansetron  **OR** ondansetron  (ZOFRAN ) IV, pentafluoroprop-tetrafluoroeth, senna-docusate   Shawn DELENA Sellar,  MD 07/10/2023, 9:06 AM

## 2023-07-10 NOTE — Progress Notes (Signed)
 Neck pain improved.  Neurologically stable.  Sedimentation rate and CRP levels significantly elevated and consistent with osteomyelitis discitis at C6-7.  Continue collar and antibiotics.  Mobilize as tolerated.  If patient continues to improve we may delay thoughts on surgery

## 2023-07-10 NOTE — Progress Notes (Signed)
 PHARMACY - PHYSICIAN COMMUNICATION CRITICAL VALUE ALERT - BLOOD CULTURE IDENTIFICATION (BCID)  Shawn [name] is an 63 y.o. male who presented to [facility] on 07/08/2023 with a chief complaint of vertebral discitis osteomyelitis C6/C7. BCID call received for 1/4 gpc staph epi with mecA res  Name of physician (or Provider) Contacted: Dr Royal  Current antibiotics: vancomycin  + cefepime   Changes to prescribed antibiotics recommended:  Patient is on recommended antibiotics - No changes needed  No results found for this or any previous visit.  Randall JENEANE Call 07/10/2023  11:40 AM

## 2023-07-10 NOTE — Progress Notes (Signed)
 HOSPITALIST                ROUNDING                 NOTE Shawn [name] FMW:[id number]  DOB: 01-21-1961  DOA: 07/08/2023  PCP: Hillman Bare, MD  07/10/2023,7:27 AM   LOS: 1 day      Code Status:  full code  From: Home  current Dispo: Unclear     63 year old black male ESRD MWF since 2011 right chest TDC-complicated by orthostatic hypotension/hypotension and bradycardic arrest in 2020 HTN DM TY 2 HLD Bilateral blindness Previous right chylothorax in 2020  Brought to emergency room 12/31 with 2-week neck pain radiating down upper back without trauma fever chills other signs of infection some tingling right hand--sodium 3.2 BUN/creatinine 20/6.2 WBC 6.0 heme 363 CXR 1 view small bilateral right greater than left pleural effusions streaky opacities right lung ESR 63, CRP 7.8 MRI = vertebral discitis osteomyelitis C6/C7 -- vertebral body height loss and retropulsion with severe canal stenosis and compression of spinal cord--  Neurosurgeon Dr. 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 Defer to neurosurgery planning-unclear what overall plan is-continue cefepime  and vancomycin  for now for osteomyelitis-narrow as appropriate as per neurosurgery--- I suspect the source is the right [facility] and this will have to be removed and a line holiday given-will coordinate with neurosurgery regarding timing Pain control currently Dilaudid  0.5 every 6 as needed Tylenol  650 every 6 as needed  I do not think he has pneumonia  Hypokalemia on admission Somewhat corrected will correct with HD-supplement if not in the next 24 hours  ESRD MWF right TDC  He has a complicated dialysis access history and only has the right TDC in the chest-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 and had sclerosis of all of the veins of the right upper extremity and is not a candidate for lower extremity access given history declot of Continue Phos renal  1000 3 times daily meals, resume Rocaltrol  1 capsule Monday Wednesday Friday, defer either Mircera or Aranesp  to renal-hemoglobin seems stable at this time  TY 2 DM-A1c 5.8-takes Tresiba  12 at bedtime no short acting CBGs ranging 120s to 130s-continue Semglee  10 units, 3 times daily sensitivity scale insulin   Hypertension with history of orthostatic hypotension previously-do not use beta-blocker secondary to arrest Continues on amlodipine  5, lisinopril  20 twice daily or as per renal  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 Continue Alphagan  1 drop into both eyes as well as Cosopt   History of CVA previously  DVT prophylaxis: Heparin   Status is: Inpatient Remains inpatient appropriate because:   Await further plan from surgery--- looks like some plan for surgery-possibly will need surgery   Subjective: Awake coherent in nad no focal deficit-pain in neck is better with wearing a [place] collar and the radiation down the right hand seems to be a little bit improved No chest pain no fever no chills no nausea Looks relatively comfortable on dialysis unit  Objective + exam Vitals:   07/09/23 1822 07/09/23 1825 07/09/23 2114 07/10/23 0544  BP:  (!) 156/73 (!) 144/67 (!) 168/74  Pulse:  82 79 75  Resp:  19 18 18   Temp:  98.5 F (36.9 C) 98.6 F (37 C) 98.2 F (36.8 C)  TempSrc:  Oral    SpO2:  100% 100% 99%  Weight: 67.6 kg     Height: 5' 11 (1.803 m)      [employer]  07/08/23 2224 07/09/23 1511 07/09/23 1822  Weight: 70 kg 69.9 kg 67.6 kg    Examination: Awake coherent EOMI NCAT no focal deficit no icterus no pallor Chest is clear no wheeze rales rhonchi Right TDC in place looks clean abdomen soft no rebound no guarding ROM intact no focal deficit power is 5/5 S1-S2 no murmur no rub no gallop   Data Reviewed: reviewed   CBC    Component Value Date/Time   WBC 5.6 07/09/2023 1920   RBC 3.66 (L) 07/09/2023 1920   HGB 9.7 (L) 07/09/2023 1920   HCT 30.9 (L)  07/09/2023 1920   PLT 340 07/09/2023 1920   MCV 84.4 07/09/2023 1920   MCH 26.5 07/09/2023 1920   MCHC 31.4 07/09/2023 1920   RDW 17.0 (H) 07/09/2023 1920   LYMPHSABS 0.6 (L) 12/19/2022 0638   MONOABS 0.8 12/19/2022 0638   EOSABS 0.1 12/19/2022 0638   BASOSABS 0.1 12/19/2022 0638      Latest Ref Rng & Units 07/09/2023    7:20 PM 07/09/2023   11:50 AM 12/21/2022    2:21 AM  CMP  Glucose 70 - 99 mg/dL 97  814  896   BUN 8 - 23 mg/dL 23  20  16    Creatinine 0.61 - 1.24 mg/dL 3.17  3.75  4.57   Sodium 135 - 145 mmol/L 135  133  133   Potassium 3.5 - 5.1 mmol/L 3.3  3.2  4.2   Chloride 98 - 111 mmol/L 97  95  95   CO2 22 - 32 mmol/L 24  23  27    Calcium  8.9 - 10.3 mg/dL 8.6  9.2  8.3     Scheduled Meds:  amLODipine   5 mg Oral Daily   aspirin  EC  325 mg Oral QHS   brimonidine   1 drop Both Eyes BID   Chlorhexidine  Gluconate Cloth  6 each Topical Q0600   dorzolamide -timolol   1 drop Both Eyes BID   heparin   5,000 Units Dialysis Once in dialysis   heparin   5,000 Units Subcutaneous Q8H   insulin  aspart  0-9 Units Subcutaneous TID WC   insulin  glargine-yfgn  10 Units Subcutaneous QHS   lanthanum   1,000 mg Oral TID WC   latanoprost   1 drop Both Eyes QHS   lisinopril   20 mg Oral BID   rosuvastatin   40 mg Oral Daily   Continuous Infusions:  anticoagulant sodium citrate      ceFEPime  (MAXIPIME ) IV Stopped (07/09/23 1744)   vancomycin       Time  44  Colen Grimes, MD  [facility]

## 2023-07-10 NOTE — H&P (View-Only) (Signed)
 Neck pain improved.  Neurologically stable.  Sedimentation rate and CRP levels significantly elevated and consistent with osteomyelitis discitis at C6-7.  Continue collar and antibiotics.  Mobilize as tolerated.  If patient continues to improve we may delay thoughts on surgery

## 2023-07-11 DIAGNOSIS — B957 Other staphylococcus as the cause of diseases classified elsewhere: Secondary | ICD-10-CM

## 2023-07-11 DIAGNOSIS — M4622 Osteomyelitis of vertebra, cervical region: Secondary | ICD-10-CM | POA: Diagnosis not present

## 2023-07-11 DIAGNOSIS — R7881 Bacteremia: Secondary | ICD-10-CM

## 2023-07-11 LAB — BASIC METABOLIC PANEL
Anion gap: 14 (ref 5–15)
BUN: 15 mg/dL (ref 8–23)
CO2: 23 mmol/L (ref 22–32)
Calcium: 8.7 mg/dL — ABNORMAL LOW (ref 8.9–10.3)
Chloride: 95 mmol/L — ABNORMAL LOW (ref 98–111)
Creatinine, Ser: 5.14 mg/dL — ABNORMAL HIGH (ref 0.61–1.24)
GFR, Estimated: 12 mL/min — ABNORMAL LOW (ref >60)
Glucose, Bld: 119 mg/dL — ABNORMAL HIGH (ref 70–99)
Potassium: 3.7 mmol/L (ref 3.5–5.1)
Sodium: 132 mmol/L — ABNORMAL LOW (ref 135–145)

## 2023-07-11 LAB — GLUCOSE, CAPILLARY
Glucose-Capillary: 116 mg/dL — ABNORMAL HIGH (ref 70–99)
Glucose-Capillary: 103 mg/dL — ABNORMAL HIGH (ref 70–99)
Glucose-Capillary: 92 mg/dL (ref 70–99)
Glucose-Capillary: 67 mg/dL — ABNORMAL LOW (ref 70–99)
Glucose-Capillary: 140 mg/dL — ABNORMAL HIGH (ref 70–99)

## 2023-07-11 MED ORDER — CHLORHEXIDINE GLUCONATE CLOTH 2 % EX PADS: 6.0000 | MEDICATED_PAD | Freq: Every day | CUTANEOUS | Status: DC

## 2023-07-11 MED ORDER — DEXTROSE 50 % IV SOLN
12.5000 g | INTRAVENOUS | Status: AC
Start: 2023-07-11 — End: 2023-07-11
  Administered 2023-07-11: 12.5 g via INTRAVENOUS
  Filled 2023-07-11: qty 50

## 2023-07-11 NOTE — Progress Notes (Signed)
 HOSPITALIST                ROUNDING                 NOTE Rami Budhu FMW:[id number]  DOB: [birth year]  DOA: 07/08/2023  PCP: Hillman Bare, MD  07/11/2023,1:09 PM   LOS: 2 days      Code Status:  full code  From: Home  current Dispo: Unclear     63 year old black male ESRD MWF since 2011 right chest TDC-complicated by orthostatic hypotension/hypotension and bradycardic arrest in 2020 HTN DM TY 2 HLD Bilateral blindness Previous right chylothorax in 2020  Brought to emergency room 12/31 with 2-week neck pain radiating down upper back without trauma fever chills other signs of infection some tingling right hand--sodium 3.2 BUN/creatinine 20/6.2 WBC 6.0 heme 363 CXR 1 view small bilateral right greater than left pleural effusions streaky opacities right lung ESR 63, CRP 7.8 MRI = vertebral discitis osteomyelitis C6/C7 -- vertebral body height loss and retropulsion with severe canal stenosis and compression of spinal cord--  Neurosurgeon Dr. Malcolm consulted Renal consulted   Plan  Cervical discitis osteomyelitis with severe central canal stenosis  continue [facility] collar-Surgical plan pending Cefepime  vancomycin  for osteo--remove R TDC 1/3 line holiday with replacement 1/6 after negative cultures Tylenol  650 mild pain, Dilaudid  0.5 every 6 as needed severe pain  ESRD MWF through right TDC line Very restricted dialysis access--- seen note 07/10/2023  continue Phos renal 1 g 3 times daily, Rocaltrol  0.5 MWF   DM TY 2 A1c 5.8-Tresiba  usually 12 at bedtime CBG ranging 100-130 on current Semglee  10 3 times daily sliding scale   Hypertension with history of orthostatic hypotension secondary to beta-blocker Continues on amlodipine  5, Zestril  20 twice daily-blood pressures are stable  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 retinopathy Continue all eyedrops  History of CVA previously  DVT prophylaxis: Heparin   Status is: Inpatient Remains inpatient appropriate because:       Subjective:  Looks fair no distress seems comfortable pain in the right arm is a little improved No chest pain No fever  Objective + exam Vitals:   07/10/23 1630 07/10/23 2104 07/11/23 0527 07/11/23 0916  BP: (!) 146/64 (!) 155/67 (!) 167/70 (!) 158/72  Pulse: 72 72 81 78  Resp:  18 19   Temp: 98.2 F (36.8 C) 98.4 F (36.9 C) 98.6 F (37 C) 98.6 F (37 C)  TempSrc: Oral Oral Oral Oral  SpO2: 98% 100% 100% 100%  Weight:      Height:       Filed Weights   07/09/23 1822 07/10/23 0810 07/10/23 1214  Weight: 67.6 kg 67.5 kg 65.5 kg    Examination:  Legally blind Moving all 4 limbs equally Right upper extremity has good power no abnormalities Chest is clear no wheeze ROM intact  Data Reviewed: reviewed   CBC    Component Value Date/Time   WBC 5.6 07/09/2023 1920   RBC 3.66 (L) 07/09/2023 1920   HGB 9.7 (L) 07/09/2023 1920   HCT 30.9 (L) 07/09/2023 1920   PLT 340 07/09/2023 1920   MCV 84.4 07/09/2023 1920   MCH 26.5 07/09/2023 1920   MCHC 31.4 07/09/2023 1920   RDW 17.0 (H) 07/09/2023 1920   LYMPHSABS 0.6 (L) 12/19/2022 0638   MONOABS 0.8 12/19/2022 0638   EOSABS 0.1 12/19/2022 0638   BASOSABS 0.1 12/19/2022 0638      Latest Ref Rng & Units 07/11/2023    4:55 AM 07/09/2023  7:20 PM 07/09/2023   11:50 AM  CMP  Glucose 70 - 99 mg/dL 880  97  814   BUN 8 - 23 mg/dL 15  23  20    Creatinine 0.61 - 1.24 mg/dL 4.85  3.17  3.75   Sodium 135 - 145 mmol/L 132  135  133   Potassium 3.5 - 5.1 mmol/L 3.7  3.3  3.2   Chloride 98 - 111 mmol/L 95  97  95   CO2 22 - 32 mmol/L 23  24  23    Calcium  8.9 - 10.3 mg/dL 8.7  8.6  9.2     Scheduled Meds:  amLODipine   5 mg Oral Daily   aspirin  EC  325 mg Oral QHS   atorvastatin   80 mg Oral Daily   brimonidine   1 drop Both Eyes BID   calcitRIOL   0.5 mcg Oral Q M,W,F   Chlorhexidine  Gluconate Cloth  6 each Topical Q0600   dorzolamide -timolol   1 drop Both Eyes BID   heparin   5,000 Units Subcutaneous Q8H   insulin  aspart   0-9 Units Subcutaneous TID WC   insulin  glargine-yfgn  10 Units Subcutaneous QHS   lanthanum   1,000 mg Oral TID WC   latanoprost   1 drop Both Eyes QHS   lisinopril   20 mg Oral BID   Continuous Infusions:  vancomycin  Stopped (07/10/23 1229)    Time  44  Jai-Gurmukh Taurus Willis, MD  [facility]

## 2023-07-11 NOTE — Progress Notes (Signed)
 Pt receives out-pt HD at [facility] on MWF. Will assist as needed.   Olivia Canter Renal Navigator [phone]

## 2023-07-11 NOTE — Progress Notes (Signed)
   Providing Compassionate, Quality Care - Together   Subjective: Patient reports pain is somewhat improved. Sitting upright is more painful. He reports weakness in his RUE.  Objective: Vital signs in last 24 hours: Temp:  [98.1 F (36.7 C)-98.6 F (37 C)] 98.6 F (37 C) (01/02 0916) Pulse Rate:  [72-86] 78 (01/02 0916) Resp:  [12-19] 19 (01/02 0527) BP: (146-181)/(64-87) 158/72 (01/02 0916) SpO2:  [98 %-100 %] 100 % (01/02 0916) Weight:  [65.5 kg] 65.5 kg (01/01 1214)  Intake/Output from previous day: 01/01 0701 - 01/02 0700 In: 580 [P.O.:480; IV Piggyback:100] Out: 2000  Intake/Output this shift: Total I/O In: 120 [P.O.:120] Out: 0   Alert and oriented x 4 PERRLA CN II-XII grossly intact Speech clear Hard cervical collar in place MAE, Right triceps weakness   Lab Results: Recent Labs    07/09/23 1150 07/09/23 1920  WBC 6.0 5.6  HGB 11.5* 9.7*  HCT 36.9* 30.9*  PLT 363 340   BMET Recent Labs    07/09/23 1920 07/11/23 0455  NA 135 132*  K 3.3* 3.7  CL 97* 95*  CO2 24 23  GLUCOSE 97 119*  BUN 23 15  CREATININE 6.82* 5.14*  CALCIUM  8.6* 8.7*    Studies/Results: No results found.  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 Continue antibiotics. Surgical recommendations pending patient's progress with antibiotics.    LOS: 2 days     Gerard Beck, DNP, AGNP-C Nurse Practitioner  [facility] [address], [address], [place], [place] [address] P: [phone]    F: [phone]  07/11/2023, 11:48 AM

## 2023-07-11 NOTE — Consult Note (Signed)
 Date of Admission:  07/08/2023          Reason for Consult: Staphylococcus epidermidis bacteremia and  vertebral osteomyelitis, discitis from C6-C7 with severe spinal canal stenosis and compression of the spinal cord   Referring Provider: Reggie Grimes, MD   Assessment:  Staphylococcus epidermidis bacteremia  C6-7 disktis, will osteomyelitis with severe spinal canal stenosis ESRD on HD via catheter   Plan:  Continue vancomycin  Close neurological monitoring for any new signs that would indicate need for urgent neurosurgical intervention Hemodialysis catheter holiday Blood cultures taken after HD catheter has been removed Unsure microbiology lab send susceptibilities on both Staphylococcus epidermidis species Blood cultures after HD catheter is out   Principal Problem:   Acute osteomyelitis of cervical spine (HCC) Active Problems:   ESRD (end stage renal disease) (HCC)   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   Scheduled Meds:  amLODipine   5 mg Oral Daily   aspirin  EC  325 mg Oral QHS   atorvastatin   80 mg Oral Daily   brimonidine   1 drop Both Eyes BID   calcitRIOL   0.5 mcg Oral Q M,W,F   Chlorhexidine  Gluconate Cloth  6 each Topical Q0600   dorzolamide -timolol   1 drop Both Eyes BID   heparin   5,000 Units Subcutaneous Q8H   insulin  aspart  0-9 Units Subcutaneous TID WC   insulin  glargine-yfgn  10 Units Subcutaneous QHS   lanthanum   1,000 mg Oral TID WC   latanoprost   1 drop Both Eyes QHS   lisinopril   20 mg Oral BID   Continuous Infusions:  vancomycin  Stopped (07/10/23 1229)   PRN Meds:.acetaminophen  **OR** acetaminophen , HYDROmorphone  (DILAUDID ) injection, ondansetron  **OR** ondansetron  (ZOFRAN ) IV, senna-docusate  HPI: Shawn [name] is a 63 y.o. male with end-stage renal disease on hemodialysis currently through tunneled catheter on the right side, having had complications with steal on the left in the past hypertension  hyperlipidemia diabetes who had been having neck pain going on for several weeks but did not seek care despite the prompting of his wife who is at the bedside today currently.  Ultimately came to the ER on the 31st.  MRI was performed which showed  1. 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 Severe spinal canal stenosis with severe compression of the spinal cord and severe bilateral neural foraminal narrowing at this level. No evidence of epidural abscess. 2. No evidence of discitis-osteomyelitis in the thoracic spine. 3. Acute appearing degenerative Schmorl's nodes at T11-12 and T12-L1. 4. Multilevel cervical spondylosis, worst at C4-5 where there is moderate-to-severe spinal canal stenosis and severe bilateral neural foraminal narrowing.    Blood cultures were taken which have shown Staphylococcus epidermidis in 2 of 2 sites that were cultured.  He is currently on vancomycin .  Neurosurgery have been consulted and are following and are hoping antibiotics will improve his condition  He does NOT yet have weakness though he DID when working with PT and when he was more upright and in pain have weaknes sin his right arm.   He has a murmur on exam.  We will continue vancomycin  for now  We will make sure that micro lab runs sensis on both staph epidermidis species growing from culture  TTE  He does need a HD line holiday and after removal of line repeat blood cultures.  I have personally spent 80 minutes involved in  face-to-face and non-face-to-face activities for this patient on the day of the visit. Professional time spent includes the following activities: Preparing to see the patient (review of tests), Obtaining and/or reviewing separately obtained history (admission/discharge record), Performing a medically appropriate  examination and/or evaluation , Ordering medications/tests/procedures, referring and communicating with other health care professionals, Documenting clinical information in the EMR, Independently interpreting results (not separately reported), Communicating results to the patient/family/caregiver, Counseling and educating the patient/family/caregiver and Care coordination (not separately reported).     Review of Systems: Review of Systems  Constitutional:  Negative for chills, fever, malaise/fatigue and weight loss.  HENT:  Negative for congestion and sore throat.   Eyes:  Negative for blurred vision and photophobia.  Respiratory:  Negative for cough, shortness of breath and wheezing.   Cardiovascular:  Negative for chest pain, palpitations and leg swelling.  Gastrointestinal:  Negative for abdominal pain, blood in stool, constipation, diarrhea, heartburn, melena, nausea and vomiting.  Genitourinary:  Negative for dysuria, flank pain and hematuria.  Musculoskeletal:  Positive for back pain and falls. Negative for joint pain and myalgias.  Skin:  Negative for itching and rash.  Neurological:  Negative for dizziness, focal weakness, loss of consciousness, weakness and headaches.  Endo/Heme/Allergies:  Does not bruise/bleed easily.  Psychiatric/Behavioral:  Negative for depression and suicidal ideas. The patient does not have insomnia.     Past Medical History:  Diagnosis Date   Anemia    Arthritis    patient denies   Blind right eye    Complication of anesthesia    Diabetes mellitus    type II   ESRD on hemodialysis (HCC)    Dialysis since 2011 MWF   HCAP (healthcare-associated pneumonia) 12/19/2022   Hypertension    Legally blind    No pertinent past medical history    PONV (postoperative nausea and vomiting)    N/V- became dehydrated had to have IV fluids- 01/2015   Shortness of breath dyspnea    when has too fluid   Stroke (HCC) 07/09/2010   date per patient   Syncope     Unspecified cerebral artery occlusion with cerebral infarction 04/02/2013    Social History   Tobacco Use   Smoking status: Never   Smokeless tobacco: Never  Vaping Use   Vaping status: Never Used  Substance Use Topics   Alcohol  use: No    Alcohol /week: 0.0 standard drinks of alcohol    Drug use: No    Family History  Problem Relation Age of Onset   Diabetes Mother    Alzheimer's disease Mother    Cancer Father    Heart disease Father    Cancer Sister    Stroke Sister    No Known Allergies  OBJECTIVE: Blood pressure (!) 158/72, pulse 78, temperature 98.6 F (37 C), temperature source Oral, resp. rate 19, height 5' 11 (1.803 m), weight 65.5 kg, SpO2 100%.  Physical Exam Constitutional:      Appearance: He is well-developed.  HENT:     Head: Normocephalic and atraumatic.  Eyes:     Conjunctiva/sclera: Conjunctivae normal.  Cardiovascular:     Rate and Rhythm: Normal rate and regular rhythm.     Heart sounds: Murmur heard.     No friction rub. No gallop.  Pulmonary:     Effort: Pulmonary effort is normal. No respiratory distress.     Breath sounds: No stridor. No wheezing or rhonchi.  Abdominal:     General: There is no distension.  Palpations: Abdomen is soft.  Musculoskeletal:        General: No tenderness. Normal range of motion.     Cervical back: Normal range of motion and neck supple.  Skin:    General: Skin is warm and dry.     Coloration: Skin is not pale.     Findings: No erythema or rash.  Neurological:     Mental Status: He is alert and oriented to person, place, and time.  Psychiatric:        Mood and Affect: Mood normal.        Behavior: Behavior normal.        Thought Content: Thought content normal.        Judgment: Judgment normal.    Wearing collar  Lab Results Lab Results  Component Value Date   WBC 5.6 07/09/2023   HGB 9.7 (L) 07/09/2023   HCT 30.9 (L) 07/09/2023   MCV 84.4 07/09/2023   PLT 340 07/09/2023    Lab Results   Component Value Date   CREATININE 5.14 (H) 07/11/2023   BUN 15 07/11/2023   NA 132 (L) 07/11/2023   K 3.7 07/11/2023   CL 95 (L) 07/11/2023   CO2 23 07/11/2023    Lab Results  Component Value Date   ALT 6 12/20/2022   AST 12 (L) 12/20/2022   ALKPHOS 99 12/20/2022   BILITOT 0.8 12/20/2022     Microbiology: Recent Results (from the past 240 hours)  Culture, blood (Routine X 2) w Reflex to ID Panel     Status: Abnormal (Preliminary result)   Collection Time: 07/09/23  3:23 PM   Specimen: BLOOD  Result Value Ref Range Status   Specimen Description BLOOD SITE NOT SPECIFIED  Final   Special Requests   Final    BOTTLES DRAWN AEROBIC AND ANAEROBIC Blood Culture adequate volume   Culture  Setup Time   Final    GRAM POSITIVE COCCI IN CLUSTERS ANAEROBIC BOTTLE ONLY CRITICAL VALUE NOTED.  VALUE IS CONSISTENT WITH PREVIOUSLY REPORTED AND CALLED VALUE.    Culture (A)  Final    STAPHYLOCOCCUS EPIDERMIDIS CULTURE REINCUBATED FOR BETTER GROWTH Performed at [facility], [address], [place], [place] [address]    Report Status PENDING  Incomplete  Culture, blood (Routine X 2) w Reflex to ID Panel     Status: Abnormal (Preliminary result)   Collection Time: 07/09/23  3:39 PM   Specimen: BLOOD  Result Value Ref Range Status   Specimen Description BLOOD SITE NOT SPECIFIED  Final   Special Requests   Final    BOTTLES DRAWN AEROBIC AND ANAEROBIC Blood Culture adequate volume   Culture  Setup Time   Final    GRAM POSITIVE COCCI IN CLUSTERS IN BOTH AEROBIC AND ANAEROBIC BOTTLES CRITICAL RESULT CALLED TO, READ BACK BY AND VERIFIED WITH: PHARMD JENNY ZHOU [id number], ADC CRITICAL VALUE NOTED.  VALUE IS CONSISTENT WITH PREVIOUSLY REPORTED AND CALLED VALUE.    Culture (A)  Final    STAPHYLOCOCCUS EPIDERMIDIS SUSCEPTIBILITIES TO FOLLOW Performed at [facility], [address], [place], [place] [address]    Report Status PENDING  Incomplete  Blood Culture ID Panel  (Reflexed)     Status: Abnormal   Collection Time: 07/09/23  3:39 PM  Result Value Ref Range Status   Enterococcus faecalis NOT DETECTED NOT DETECTED Final   Enterococcus Faecium NOT DETECTED NOT DETECTED Final   Listeria monocytogenes NOT DETECTED NOT DETECTED Final   Staphylococcus  species DETECTED (A) NOT DETECTED Final    Comment: CRITICAL RESULT CALLED TO, READ BACK BY AND VERIFIED WITH: PHARMD JENNY ZHOU [id number], ADC    Staphylococcus aureus (BCID) NOT DETECTED NOT DETECTED Final   Staphylococcus epidermidis DETECTED (A) NOT DETECTED Final    Comment: Methicillin (oxacillin) resistant coagulase negative staphylococcus. Possible blood culture contaminant (unless isolated from more than one blood culture draw or clinical case suggests pathogenicity). No antibiotic treatment is indicated for blood  culture contaminants. CRITICAL RESULT CALLED TO, READ BACK BY AND VERIFIED WITH: PHARMD JENNY ZHOU [id number], ADC    Staphylococcus lugdunensis NOT DETECTED NOT DETECTED Final   Streptococcus species NOT DETECTED NOT DETECTED Final   Streptococcus agalactiae NOT DETECTED NOT DETECTED Final   Streptococcus pneumoniae NOT DETECTED NOT DETECTED Final   Streptococcus pyogenes NOT DETECTED NOT DETECTED Final   A.calcoaceticus-baumannii NOT DETECTED NOT DETECTED Final   Bacteroides fragilis NOT DETECTED NOT DETECTED Final   Enterobacterales NOT DETECTED NOT DETECTED Final   Enterobacter cloacae complex NOT DETECTED NOT DETECTED Final   Escherichia coli NOT DETECTED NOT DETECTED Final   Klebsiella aerogenes NOT DETECTED NOT DETECTED Final   Klebsiella oxytoca NOT DETECTED NOT DETECTED Final   Klebsiella pneumoniae NOT DETECTED NOT DETECTED Final   Proteus species NOT DETECTED NOT DETECTED Final   Salmonella species NOT DETECTED NOT DETECTED Final   Serratia marcescens NOT DETECTED NOT DETECTED Final   Haemophilus influenzae NOT DETECTED NOT DETECTED Final   Neisseria meningitidis NOT  DETECTED NOT DETECTED Final   Pseudomonas aeruginosa NOT DETECTED NOT DETECTED Final   Stenotrophomonas maltophilia NOT DETECTED NOT DETECTED Final   Candida albicans NOT DETECTED NOT DETECTED Final   Candida auris NOT DETECTED NOT DETECTED Final   Candida glabrata NOT DETECTED NOT DETECTED Final   Candida krusei NOT DETECTED NOT DETECTED Final   Candida parapsilosis NOT DETECTED NOT DETECTED Final   Candida tropicalis NOT DETECTED NOT DETECTED Final   Cryptococcus neoformans/gattii NOT DETECTED NOT DETECTED Final   Methicillin resistance mecA/C DETECTED (A) NOT DETECTED Final    Comment: CRITICAL RESULT CALLED TO, READ BACK BY AND VERIFIED WITH: PHARMD JENNY ZHOU [phone], ADC Performed at [facility], [address], [place], [place] [address]   MRSA Next Gen by PCR, Nasal     Status: None   Collection Time: 07/09/23  6:42 PM   Specimen: Nasal Mucosa; Nasal Swab  Result Value Ref Range Status   MRSA by PCR Next Gen NOT DETECTED NOT DETECTED Final    Comment: (NOTE) The GeneXpert MRSA Assay (FDA approved for NASAL specimens only), is one component of a comprehensive MRSA colonization surveillance program. It is not intended to diagnose MRSA infection nor to guide or monitor treatment for MRSA infections. Test performance is not FDA approved in patients less than 14 years old. Performed at [facility], [address] [place], [place] [address]     [name], MD [facility] [phone] pager  07/11/2023, 2:53 PM

## 2023-07-11 NOTE — Progress Notes (Signed)
 [facility] Progress Note   Subjective:   Patient seen in room, reports neck is still sore but a little bit better in the brace. Noted blood culture was positive for staph. Pt denies fever, chills, SOB, CP, palpitations, dizziness.   Objective Vitals:   07/10/23 1630 07/10/23 2104 07/11/23 0527 07/11/23 0916  BP: (!) 146/64 (!) 155/67 (!) 167/70 (!) 158/72  Pulse: 72 72 81 78  Resp:  18 19   Temp: 98.2 F (36.8 C) 98.4 F (36.9 C) 98.6 F (37 C) 98.6 F (37 C)  TempSrc: Oral Oral Oral Oral  SpO2: 98% 100% 100% 100%  Weight:      Height:       Physical Exam General: Alert male in NAD Heart: RRR, no murmurs, rubs or gallops Lungs: CTA bilaterally, respirations unlabored on RA Abdomen: Soft, non-distended, +BS Extremities: No edema b/l lower extremities  Dialysis Access: R internal jugular [facility]  Additional Objective Labs: Basic Metabolic Panel: Recent Labs  Lab 07/09/23 1150 07/09/23 1920 07/11/23 0455  NA 133* 135 132*  K 3.2* 3.3* 3.7  CL 95* 97* 95*  CO2 23 24 23   GLUCOSE 185* 97 119*  BUN 20 23 15   CREATININE 6.24* 6.82* 5.14*  CALCIUM  9.2 8.6* 8.7*  PHOS  --  3.9  --    Liver Function Tests: Recent Labs  Lab 07/09/23 1920  ALBUMIN  2.7*   No results for input(s): LIPASE, AMYLASE in the last 168 hours. CBC: Recent Labs  Lab 07/09/23 1150 07/09/23 1920  WBC 6.0 5.6  HGB 11.5* 9.7*  HCT 36.9* 30.9*  MCV 84.4 84.4  PLT 363 340   Blood Culture    Component Value Date/Time   SDES BLOOD SITE NOT SPECIFIED 07/09/2023 1539   SPECREQUEST  07/09/2023 1539    BOTTLES DRAWN AEROBIC AND ANAEROBIC Blood Culture adequate volume   CULT GRAM POSITIVE COCCI 07/09/2023 1539   REPTSTATUS PENDING 07/09/2023 1539    Cardiac Enzymes: No results for input(s): CKTOTAL, CKMB, CKMBINDEX, TROPONINI in the last 168 hours. CBG: Recent Labs  Lab 07/10/23 0722 07/10/23 1234 07/10/23 1628 07/10/23 2107 07/11/23 0717  GLUCAP 138* 82 115*  131* 116*   Iron Studies: No results for input(s): IRON, TIBC, TRANSFERRIN, FERRITIN in the last 72 hours. @lablastinr3 @ Studies/Results: MR THORACIC SPINE W WO CONTRAST Result Date: 07/09/2023 CLINICAL DATA:  Osteomyelitis, cervical; Osteomyelitis, thoracic patient with neck pain to upper back ct with ? infection. EXAM: MRI CERVICAL AND THORACIC SPINE WITHOUT AND WITH CONTRAST TECHNIQUE: Multiplanar and multiecho pulse sequences of the cervical spine, to include the craniocervical junction and cervicothoracic junction, and the thoracic spine, were obtained without and with intravenous contrast. CONTRAST:  7mL GADAVIST  GADOBUTROL  1 MMOL/ML IV SOLN COMPARISON:  CT cervical spine 07/09/2023. FINDINGS: MRI CERVICAL SPINE FINDINGS Alignment: Destruction and collapse of the C6 and C7 vertebral bodies results in slight retrolisthesis of C6 on C7. 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 Trace prevertebral edema. No evidence of epidural abscess. Cord: Severe compression of the spinal cord at the C6-7 level. Posterior Fossa, vertebral arteries, paraspinal tissues: As above. Otherwise, moderate prevertebral phlegmon at C6-7. Disc levels: C2-C3:  Normal. C3-C4: Central disc protrusion and ligamentum flavum buckling results in moderate spinal canal stenosis. Facet arthropathy and uncovertebral joint spurring results in severe bilateral neural foraminal narrowing. C4-C5: Disc bulge and ligamentum flavum buckling  results in moderate-to-severe spinal canal stenosis. Facet arthropathy and uncovertebral joint spurring contribute to severe bilateral neural foraminal narrowing. C5-C6: Disc bulge results in mild spinal canal stenosis. Left-greater-than-right facet arthropathy and uncovertebral joint spurring results in severe left  and moderate right neural foraminal narrowing. C6-C7: Collapse of the vertebral bodies, retrolisthesis and retropulsion of the posterior cortex of C6 results in severe spinal canal stenosis and severe bilateral neural foraminal narrowing. C7-T1:  Unremarkable. MRI THORACIC SPINE FINDINGS Alignment:  Normal. Vertebrae: No fracture evidence of discitis-osteomyelitis. Acute appearing degenerative Schmorl's nodes at T11-12 and T12-L1. Cord: Normal spinal cord signal and volume. No abnormal enhancement. Paraspinal and other soft tissues: Polycystic kidneys. Disc levels: No significant disc herniation, spinal canal stenosis or neural foraminal narrowing. IMPRESSION: 1. 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 Severe spinal canal stenosis with severe compression of the spinal cord and severe bilateral neural foraminal narrowing at this level. No evidence of epidural abscess. 2. No evidence of discitis-osteomyelitis in the thoracic spine. 3. Acute appearing degenerative Schmorl's nodes at T11-12 and T12-L1. 4. Multilevel cervical spondylosis, worst at C4-5 where there is moderate-to-severe spinal canal stenosis and severe bilateral neural foraminal narrowing. Electronically Signed   By: Ryan Chess M.D.   On: 07/09/2023 12:23   Medications:  vancomycin  Stopped (07/10/23 1229)    amLODipine   5 mg Oral Daily   aspirin  EC  325 mg Oral QHS   atorvastatin   80 mg Oral Daily   brimonidine   1 drop Both Eyes BID   calcitRIOL   0.5 mcg Oral Q M,W,F   Chlorhexidine  Gluconate Cloth  6 each Topical Q0600   dorzolamide -timolol   1 drop Both Eyes BID   heparin   5,000 Units Subcutaneous Q8H   insulin  aspart  0-9 Units Subcutaneous TID WC   insulin  glargine-yfgn  10 Units Subcutaneous QHS   lanthanum   1,000 mg Oral TID WC   latanoprost   1 drop Both Eyes QHS   lisinopril    20 mg Oral BID    Dialysis Orders: Center: [facility]  on MWF. 180Nre 3hr 30 min BFR 400 DFR Auto 1.5 EDW 69.9kg 2K 2.5Ca TDC Heparin  6500 unit bolus and 2900 unit bolus mid HD Mircera 120mcg IV q 2 weeks- last dose 06/30/23 Calcitriol  0.5mcg PO q HD    Assessment/Plan: Neck pain: Imaging concerning for osteomyelitis/discitis. Neurosurgery following, started empiric antibiotics with plan for possible surgery next week. Patient does have a TDC which is a possible source of infection, blood culture was positive for staph. Will arrange for a line holiday over the weekend with [facility] removed post HD tomorrow.   ESRD:  On dialysis MWF, next HD tomorrow, see above  Hypertension/volume: BP slightly elevated, euvolemic on exam. Below prior EDW, will need to be lowered at discharge. Continue home BP meds  Anemia: Hgb 9.7. ESA recently dosed. Holding IV Fe in setting of acute infection.   Metabolic bone disease: Calcium  controlled, recent phos and PTH both at goal. Continue calcitriol  and binders   T2DM: With retinopathy. Management per primary team    Lucie Collet, PA-C 07/11/2023, 10:37 AM  [facility] Pager: [phone]

## 2023-07-11 NOTE — Plan of Care (Signed)

## 2023-07-12 ENCOUNTER — Other Ambulatory Visit: Payer: Self-pay | Admitting: Neurosurgery

## 2023-07-12 ENCOUNTER — Inpatient Hospital Stay (HOSPITAL_COMMUNITY): Payer: Medicare Other

## 2023-07-12 DIAGNOSIS — R7881 Bacteremia: Secondary | ICD-10-CM | POA: Diagnosis not present

## 2023-07-12 DIAGNOSIS — M4622 Osteomyelitis of vertebra, cervical region: Secondary | ICD-10-CM | POA: Diagnosis not present

## 2023-07-12 HISTORY — PX: IR PTA VENOUS EXCEPT DIALYSIS CIRCUIT: IMG6126

## 2023-07-12 HISTORY — PX: IR FLUORO GUIDE CV LINE RIGHT: IMG2283

## 2023-07-12 LAB — BASIC METABOLIC PANEL
Anion gap: 14 (ref 5–15)
BUN: 12 mg/dL (ref 8–23)
CO2: 24 mmol/L (ref 22–32)
Calcium: 9 mg/dL (ref 8.9–10.3)
Chloride: 94 mmol/L — ABNORMAL LOW (ref 98–111)
Creatinine, Ser: 3.54 mg/dL — ABNORMAL HIGH (ref 0.61–1.24)
GFR, Estimated: 19 mL/min — ABNORMAL LOW (ref >60)
Glucose, Bld: 80 mg/dL (ref 70–99)
Potassium: 3.4 mmol/L — ABNORMAL LOW (ref 3.5–5.1)
Sodium: 132 mmol/L — ABNORMAL LOW (ref 135–145)

## 2023-07-12 LAB — GLUCOSE, CAPILLARY
Glucose-Capillary: 86 mg/dL (ref 70–99)
Glucose-Capillary: 54 mg/dL — ABNORMAL LOW (ref 70–99)
Glucose-Capillary: 57 mg/dL — ABNORMAL LOW (ref 70–99)
Glucose-Capillary: 51 mg/dL — ABNORMAL LOW (ref 70–99)
Glucose-Capillary: 80 mg/dL (ref 70–99)

## 2023-07-12 LAB — ECHOCARDIOGRAM COMPLETE
AR max vel: 1.8 cm2
AV Area VTI: 1.66 cm2
AV Area mean vel: 1.56 cm2
AV Mean grad: 5 mm[Hg]
AV Peak grad: 8.8 mm[Hg]
Ao pk vel: 1.48 m/s
Area-P 1/2: 9.73 cm2
Calc EF: 29.4 %
Height: 71 in
S' Lateral: 4.5 cm
Single Plane A2C EF: 31.8 %
Single Plane A4C EF: 36.8 %
Weight: 2363.33 [oz_av]

## 2023-07-12 LAB — CULTURE, BLOOD (ROUTINE X 2): Special Requests: ADEQUATE

## 2023-07-12 LAB — SURGICAL PCR SCREEN
MRSA, PCR: NEGATIVE
Staphylococcus aureus: NEGATIVE

## 2023-07-12 MED ORDER — VANCOMYCIN HCL 750 MG/150ML IV SOLN: 750.0000 mg | INTRAVENOUS | Status: DC

## 2023-07-12 MED ORDER — LIDOCAINE HCL 1 % IJ SOLN
20.0000 mL | Freq: Once | INTRAMUSCULAR | Status: AC
  Administered 2023-07-12: 10 mL

## 2023-07-12 MED ORDER — LIDOCAINE HCL 1 % IJ SOLN
INTRAMUSCULAR | Status: AC
  Filled 2023-07-12: qty 20

## 2023-07-12 MED ORDER — GLUCAGON HCL RDNA (DIAGNOSTIC) 1 MG IJ SOLR
INTRAMUSCULAR | Status: AC
  Administered 2023-07-12: 1 mg via INTRAMUSCULAR
  Filled 2023-07-12: qty 1

## 2023-07-12 MED ORDER — GLUCAGON HCL RDNA (DIAGNOSTIC) 1 MG IJ SOLR: 1.0000 mg | INTRAMUSCULAR | Status: AC

## 2023-07-12 MED ORDER — CEFAZOLIN SODIUM-DEXTROSE 2-4 GM/100ML-% IV SOLN
2.0000 g | Freq: Once | INTRAVENOUS | Status: AC
  Administered 2023-07-12: 2 g via INTRAVENOUS

## 2023-07-12 MED ORDER — VANCOMYCIN HCL 1000 MG IV SOLR
750.0000 mg | INTRAVENOUS | Status: DC
  Administered 2023-07-12: 750 mg via INTRAVENOUS
  Filled 2023-07-12: qty 15

## 2023-07-12 MED ORDER — HEPARIN SODIUM (PORCINE) 1000 UNIT/ML IJ SOLN
3.2000 mL | Freq: Once | INTRAMUSCULAR | Status: AC
  Administered 2023-07-12: 3200 [IU] via INTRAVENOUS

## 2023-07-12 MED ORDER — HEPARIN SODIUM (PORCINE) 1000 UNIT/ML IJ SOLN
INTRAMUSCULAR | Status: AC
  Filled 2023-07-12: qty 10

## 2023-07-12 MED ORDER — CEFAZOLIN SODIUM-DEXTROSE 2-4 GM/100ML-% IV SOLN
INTRAVENOUS | Status: AC
  Filled 2023-07-12: qty 100

## 2023-07-12 MED ORDER — PERFLUTREN LIPID MICROSPHERE
1.0000 mL | INTRAVENOUS | Status: AC | PRN
  Administered 2023-07-12: 3 mL via INTRAVENOUS

## 2023-07-12 MED ORDER — IOHEXOL 300 MG/ML  SOLN
50.0000 mL | Freq: Once | INTRAMUSCULAR | Status: AC | PRN
  Administered 2023-07-12: 8 mL via INTRAVENOUS

## 2023-07-12 NOTE — Progress Notes (Signed)
   07/12/23 1133  Vitals  Temp 97.8 F (36.6 C)  Pulse Rate 86  Resp 13  BP (!) 172/83  SpO2 99 %  O2 Device Room Air  Type of Weight Post-Dialysis  Post Treatment  Dialyzer Clearance Lightly streaked  Liters Processed 74.2  Fluid Removed (mL) 1800 mL  Tolerated HD Treatment Yes   Received patient in bed to unit.  Alert and oriented.  Informed consent signed and in chart.   TX duration:3.5 hours  Patient tolerated well.  Transported back to the room  Alert, without acute distress.  Hand-off given to patient's nurse.  Primary Nurse aware of patient BP above. Patient denies any complaints at this time.  Access used: R CVC Access issues:Sluggisg AV port, lines reversed  Total UF removed:    Geni Seip, RN [facility]

## 2023-07-12 NOTE — Progress Notes (Signed)
 HOSPITALIST                ROUNDING                 NOTE Shawn [name] FMW:[id number]  DOB: August 16, 1960  DOA: 07/08/2023  PCP: Hillman Bare, MD  07/12/2023,5:53 PM   LOS: 3 days      Code Status:  full code  From: Home  current Dispo: Unclear     63 year old black male ESRD MWF since 2011 right chest TDC-complicated by orthostatic hypotension/hypotension and bradycardic arrest in 2020 HTN DM TY 2 HLD Bilateral blindness Previous right chylothorax in 2020  Brought to emergency room 12/31 with 2-week neck pain radiating down upper back without trauma fever chills other signs of infection some tingling right hand--sodium 3.2 BUN/creatinine 20/6.2 WBC 6.0 heme 363 CXR 1 view small bilateral right greater than left pleural effusions streaky opacities right lung ESR 63, CRP 7.8 MRI = vertebral discitis osteomyelitis C6/C7 -- vertebral body height loss and retropulsion with severe canal stenosis and compression of spinal cord--  Neurosurgeon Dr. 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 Tylenol  650 mild pain, Dilaudid  0.5 every 6 as needed severe pain  ESRD MWF through right TDC line Very restricted dialysis access--- seen note 07/10/2023  continue Phos renal 1 g 3 times daily, Rocaltrol  0.5 MWF   DM TY 2 A1c 5.8-Tresiba  usually 12 at bedtime CBG ranging 80s to 100s on current Semglee  10-continues on 3 times daily sliding scale   Hypertension with history of orthostatic hypotension secondary to beta-blocker Continues on amlodipine  5, Zestril  20 twice daily-blood  pressures are stable  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 retinopathy Continue all eyedrops  History of CVA previously  DVT prophylaxis: Heparin   Status is: Inpatient Remains inpatient appropriate because:      Subjective:  Went to unit twice to see the patient and he had been off for several procedures-did not examine  Objective + exam Vitals:   07/12/23 1100 07/12/23 1130 07/12/23 1133 07/12/23 1225  BP: (!) 155/97 (!) 172/85 (!) 172/83 (!) 170/72  Pulse: 85 85 86 90  Resp: 20 10 13 19   Temp:   97.8 F (36.6 C) 98 F (36.7 C)  TempSrc:    Oral  SpO2: 100% 100% 99% 97%  Weight:      Height:       Filed Weights   07/10/23 0810 07/10/23 1214 07/12/23 0728  Weight: 67.5 kg 65.5 kg 67 kg    Examination:  No exam today as off unit several times  Data Reviewed: reviewed   CBC    Component Value Date/Time   WBC 5.6 07/09/2023 1920   RBC 3.66 (L) 07/09/2023 1920   HGB 9.7 (L) 07/09/2023 1920   HCT 30.9 (L) 07/09/2023 1920   PLT 340 07/09/2023 1920   MCV 84.4 07/09/2023 1920   MCH 26.5 07/09/2023 1920   MCHC 31.4 07/09/2023 1920   RDW 17.0 (H) 07/09/2023 1920   LYMPHSABS 0.6 (L) 12/19/2022 0638   MONOABS 0.8 12/19/2022 0638   EOSABS 0.1 12/19/2022 0638   BASOSABS 0.1 12/19/2022 9361      Latest Ref Rng &  Units 07/12/2023    1:03 PM 07/11/2023    4:55 AM 07/09/2023    7:20 PM  CMP  Glucose 70 - 99 mg/dL 80  880  97   BUN 8 - 23 mg/dL 12  15  23    Creatinine 0.61 - 1.24 mg/dL 6.45  4.85  3.17   Sodium 135 - 145 mmol/L 132  132  135   Potassium 3.5 - 5.1 mmol/L 3.4  3.7  3.3   Chloride 98 - 111 mmol/L 94  95  97   CO2 22 - 32 mmol/L 24  23  24    Calcium  8.9 - 10.3 mg/dL 9.0  8.7  8.6     Scheduled Meds:  amLODipine   5 mg Oral Daily   aspirin  EC  325 mg Oral QHS   atorvastatin   80 mg Oral Daily   brimonidine   1 drop Both Eyes BID   calcitRIOL   0.5 mcg Oral Q M,W,F   Chlorhexidine  Gluconate Cloth  6 each Topical Q0600   dorzolamide -timolol   1  drop Both Eyes BID   heparin   5,000 Units Subcutaneous Q8H   insulin  aspart  0-9 Units Subcutaneous TID WC   insulin  glargine-yfgn  10 Units Subcutaneous QHS   lanthanum   1,000 mg Oral TID WC   latanoprost   1 drop Both Eyes QHS   lisinopril   20 mg Oral BID   Continuous Infusions:   ceFAZolin  (ANCEF ) IV 2 g (07/12/23 1744)   vancomycin  (VANCOCIN ) 750 mg in sodium chloride  0.9 % 250 mL IVPB Stopped (07/12/23 1239)    Time  44  Jai-Gurmukh Rudolf Blizard, MD  [facility]

## 2023-07-12 NOTE — Procedures (Signed)
 I was present at this dialysis session. I have reviewed the session itself and made appropriate changes.   Vital signs in last 24 hours:  Temp:  [97.6 F (36.4 C)-98.3 F (36.8 C)] 97.8 F (36.6 C) (01/03 0728) Pulse Rate:  [72-88] 84 (01/03 0900) Resp:  [10-20] 11 (01/03 0900) BP: (155-180)/(62-82) 174/81 (01/03 0900) SpO2:  [98 %-100 %] 100 % (01/03 0900) Weight:  [67 kg] 67 kg (01/03 0728) Weight change:  Filed Weights   07/10/23 0810 07/10/23 1214 07/12/23 0728  Weight: 67.5 kg 65.5 kg 67 kg    Recent Labs  Lab 07/09/23 1920 07/11/23 0455  NA 135 132*  K 3.3* 3.7  CL 97* 95*  CO2 24 23  GLUCOSE 97 119*  BUN 23 15  CREATININE 6.82* 5.14*  CALCIUM  8.6* 8.7*  PHOS 3.9  --     Recent Labs  Lab 07/09/23 1150 07/09/23 1920  WBC 6.0 5.6  HGB 11.5* 9.7*  HCT 36.9* 30.9*  MCV 84.4 84.4  PLT 363 340    Scheduled Meds:  amLODipine   5 mg Oral Daily   aspirin  EC  325 mg Oral QHS   atorvastatin   80 mg Oral Daily   brimonidine   1 drop Both Eyes BID   calcitRIOL   0.5 mcg Oral Q M,W,F   Chlorhexidine  Gluconate Cloth  6 each Topical Q0600   dorzolamide -timolol   1 drop Both Eyes BID   heparin   5,000 Units Subcutaneous Q8H   insulin  aspart  0-9 Units Subcutaneous TID WC   insulin  glargine-yfgn  10 Units Subcutaneous QHS   lanthanum   1,000 mg Oral TID WC   latanoprost   1 drop Both Eyes QHS   lisinopril   20 mg Oral BID   Continuous Infusions:  vancomycin      PRN Meds:.acetaminophen  **OR** acetaminophen , HYDROmorphone  (DILAUDID ) injection, ondansetron  **OR** ondansetron  (ZOFRAN ) IV, senna-docusate   Fairy DELENA Sellar,  MD 07/12/2023, 9:17 AM

## 2023-07-12 NOTE — Progress Notes (Signed)
 I withheld Mr Shawn [name]  because his blood sugar dropped to 54. I tested it again to make sure and it was 57. Initially I gave him some apple Juice with Gram crackers. 30 minutes later it dropped to 51. I gave him glucagon  and on retest it went up to 80. He is alert and oriented and was coherently talking to his wife when I left his room. MD on call and charge nurse was updated.

## 2023-07-12 NOTE — Progress Notes (Signed)
      INFECTIOUS DISEASE ATTENDING ADDENDUM:   Date: 07/12/2023  Patient name: Shawn [name]  Medical record number: [id number]  Date of birth: December 16, 1960   Catheter was not removed by IR. See notes re limited IV access.  Fortunately this is Staph epidermidis and not Staph aureus but I would not like to have to treat through the catheter in a patient with this type of C spine infection  I will likely want to treat him with oral antibiotics for life--which will be difficult since it is a MR SE and tetracycline R one as well. Bactrim  may be drug we have to use for chronic suppression/  I will follow his culture data and course.   [name] 07/12/2023, 6:45 PM

## 2023-07-12 NOTE — Progress Notes (Signed)
 IR Procedure request for right subclavian tunneled hemodialysis catheter removal for line holiday. Case discussed this case R Attending Dr. Warren Bellman. 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 Recommend catheter exchange now, while they treat. when blood cultures negative we should switch again. This was communicated directly with the Team who is in agreement with the plan of care.  Patient scheduled for OTW HD Catheter exchange.

## 2023-07-12 NOTE — Progress Notes (Signed)
 Echocardiogram 2D Echocardiogram has been performed.  Shawn [name] 07/12/2023, 1:24 PM

## 2023-07-12 NOTE — Progress Notes (Signed)
 Subjective: Having neck pain when he sits up also still having intermittent right upper extremity weakness   Antibiotics:  Anti-infectives (From admission, onward)    Start     Dose/Rate Route Frequency Ordered Stop   07/12/23 1800  vancomycin  (VANCOREADY) IVPB 750 mg/150 mL  Status:  Discontinued        750 mg 150 mL/hr over 60 Minutes Intravenous Every M-W-F (Hemodialysis) 07/12/23 0824 07/12/23 1015   07/12/23 1800  vancomycin  (VANCOCIN ) 750 mg in sodium chloride  0.9 % 250 mL IVPB       Note to Pharmacy: Indication: Osteomyelitis   750 mg 265 mL/hr over 60 Minutes Intravenous Every M-W-F (Hemodialysis) 07/12/23 1015     07/10/23 1800  vancomycin  (VANCOREADY) IVPB 750 mg/150 mL  Status:  Discontinued        750 mg 150 mL/hr over 60 Minutes Intravenous Every M-W-F (Hemodialysis) 07/09/23 1518 07/12/23 0824   07/09/23 1545  vancomycin  (VANCOREADY) IVPB 1500 mg/300 mL        1,500 mg 150 mL/hr over 120 Minutes Intravenous Once 07/09/23 1531 07/09/23 1858   07/09/23 1530  ceFEPIme  (MAXIPIME ) 1 g in sodium chloride  0.9 % 100 mL IVPB  Status:  Discontinued        1 g 200 mL/hr over 30 Minutes Intravenous Every 24 hours 07/09/23 1515 07/11/23 0814   07/09/23 1515  Vancomycin  (VANCOCIN ) 1,500 mg in sodium chloride  0.9 % 500 mL IVPB  Status:  Discontinued        1,500 mg 250 mL/hr over 120 Minutes Intravenous  Once 07/09/23 1512 07/09/23 1531   07/09/23 1515  ceFEPIme  (MAXIPIME ) 2 g in sodium chloride  0.9 % 100 mL IVPB  Status:  Discontinued        2 g 200 mL/hr over 30 Minutes Intravenous  Once 07/09/23 1512 07/09/23 1515       Medications: Scheduled Meds:  amLODipine   5 mg Oral Daily   aspirin  EC  325 mg Oral QHS   atorvastatin   80 mg Oral Daily   brimonidine   1 drop Both Eyes BID   calcitRIOL   0.5 mcg Oral Q M,W,F   Chlorhexidine  Gluconate Cloth  6 each Topical Q0600   dorzolamide -timolol   1 drop Both Eyes BID   heparin   5,000 Units Subcutaneous Q8H   insulin   aspart  0-9 Units Subcutaneous TID WC   insulin  glargine-yfgn  10 Units Subcutaneous QHS   lanthanum   1,000 mg Oral TID WC   latanoprost   1 drop Both Eyes QHS   lisinopril   20 mg Oral BID   Continuous Infusions:  vancomycin  (VANCOCIN ) 750 mg in sodium chloride  0.9 % 250 mL IVPB Stopped (07/12/23 1239)   PRN Meds:.acetaminophen  **OR** acetaminophen , HYDROmorphone  (DILAUDID ) injection, ondansetron  **OR** ondansetron  (ZOFRAN ) IV, senna-docusate    Objective: Weight change:   Intake/Output Summary (Last 24 hours) at 07/12/2023 1528 Last data filed at 07/12/2023 1133 Gross per 24 hour  Intake --  Output 1800 ml  Net -1800 ml   Blood pressure (!) 170/72, pulse 90, temperature 98 F (36.7 C), temperature source Oral, resp. rate 19, height 5' 11 (1.803 m), weight 67 kg, SpO2 97%. Temp:  [97.6 F (36.4 C)-98.3 F (36.8 C)] 98 F (36.7 C) (01/03 1225) Pulse Rate:  [72-90] 90 (01/03 1225) Resp:  [10-20] 19 (01/03 1225) BP: (155-180)/(62-97) 170/72 (01/03 1225) SpO2:  [97 %-100 %] 97 % (01/03 1225) Weight:  [67 kg] 67 kg (01/03 0728)  Physical Exam: Physical Exam  Constitutional:      Appearance: He is well-developed.  HENT:     Head: Normocephalic and atraumatic.  Eyes:     Conjunctiva/sclera: Conjunctivae normal.  Cardiovascular:     Rate and Rhythm: Normal rate and regular rhythm.     Heart sounds: Murmur heard.  Pulmonary:     Effort: Pulmonary effort is normal. No respiratory distress.     Breath sounds: No wheezing.  Abdominal:     General: There is no distension.     Palpations: Abdomen is soft.  Musculoskeletal:        General: Normal range of motion.     Cervical back: Normal range of motion and neck supple.  Skin:    General: Skin is warm and dry.     Findings: No erythema or rash.  Neurological:     General: No focal deficit present.     Mental Status: He is alert and oriented to person, place, and time.     Comments: Hip seems weaker than I would expect for  right-handed person though he is also undergoing dialysis on the right side this is the side that gets weaker when he tries to participate in physical therapy  Psychiatric:        Mood and Affect: Mood normal.        Behavior: Behavior normal.        Thought Content: Thought content normal.        Judgment: Judgment normal.      CBC:    BMET Recent Labs    07/11/23 0455 07/12/23 1303  NA 132* 132*  K 3.7 3.4*  CL 95* 94*  CO2 23 24  GLUCOSE 119* 80  BUN 15 12  CREATININE 5.14* 3.54*  CALCIUM  8.7* 9.0     Liver Panel  Recent Labs    07/09/23 1920  ALBUMIN  2.7*       Sedimentation Rate No results for input(s): ESRSEDRATE in the last 72 hours. C-Reactive Protein No results for input(s): CRP in the last 72 hours.  Micro Results: Recent Results (from the past 720 hours)  Culture, blood (Routine X 2) w Reflex to ID Panel     Status: Abnormal (Preliminary result)   Collection Time: 07/09/23  3:23 PM   Specimen: BLOOD  Result Value Ref Range Status   Specimen Description BLOOD SITE NOT SPECIFIED  Final   Special Requests   Final    BOTTLES DRAWN AEROBIC AND ANAEROBIC Blood Culture adequate volume   Culture  Setup Time   Final    GRAM POSITIVE COCCI IN CLUSTERS ANAEROBIC BOTTLE ONLY CRITICAL VALUE NOTED.  VALUE IS CONSISTENT WITH PREVIOUSLY REPORTED AND CALLED VALUE.    Culture (A)  Final    STAPHYLOCOCCUS EPIDERMIDIS SUSCEPTIBILITIES TO FOLLOW Performed at [facility], [address], [place], [place] [address]    Report Status PENDING  Incomplete  Culture, blood (Routine X 2) w Reflex to ID Panel     Status: Abnormal   Collection Time: 07/09/23  3:39 PM   Specimen: BLOOD  Result Value Ref Range Status   Specimen Description BLOOD SITE NOT SPECIFIED  Final   Special Requests   Final    BOTTLES DRAWN AEROBIC AND ANAEROBIC Blood Culture adequate volume   Culture  Setup Time   Final    GRAM POSITIVE COCCI IN CLUSTERS IN BOTH AEROBIC AND  ANAEROBIC BOTTLES CRITICAL RESULT CALLED TO, READ BACK BY AND VERIFIED WITH: PHARMD JENNY ZHOU [id number], ADC  CRITICAL VALUE NOTED.  VALUE IS CONSISTENT WITH PREVIOUSLY REPORTED AND CALLED VALUE. Performed at [name], [address], [place], [place] [address]    Culture STAPHYLOCOCCUS EPIDERMIDIS (A)  Final   Report Status 07/12/2023 FINAL  Final   Organism ID, Bacteria STAPHYLOCOCCUS EPIDERMIDIS  Final      Susceptibility   Staphylococcus epidermidis - MIC*    CIPROFLOXACIN <=0.5 SENSITIVE Sensitive     ERYTHROMYCIN  >=8 RESISTANT Resistant     GENTAMICIN <=0.5 SENSITIVE Sensitive     OXACILLIN >=4 RESISTANT Resistant     TETRACYCLINE >=16 RESISTANT Resistant     VANCOMYCIN  2 SENSITIVE Sensitive     TRIMETH /SULFA  <=10 SENSITIVE Sensitive     CLINDAMYCIN <=0.25 SENSITIVE Sensitive     RIFAMPIN <=0.5 SENSITIVE Sensitive     Inducible Clindamycin NEGATIVE Sensitive     * STAPHYLOCOCCUS EPIDERMIDIS  Blood Culture ID Panel (Reflexed)     Status: Abnormal   Collection Time: 07/09/23  3:39 PM  Result Value Ref Range Status   Enterococcus faecalis NOT DETECTED NOT DETECTED Final   Enterococcus Faecium NOT DETECTED NOT DETECTED Final   Listeria monocytogenes NOT DETECTED NOT DETECTED Final   Staphylococcus species DETECTED (A) NOT DETECTED Final    Comment: CRITICAL RESULT CALLED TO, READ BACK BY AND VERIFIED WITH: PHARMD JENNY ZHOU [id number], ADC    Staphylococcus aureus (BCID) NOT DETECTED NOT DETECTED Final   Staphylococcus epidermidis DETECTED (A) NOT DETECTED Final    Comment: Methicillin (oxacillin) resistant coagulase negative staphylococcus. Possible blood culture contaminant (unless isolated from more than one blood culture draw or clinical case suggests pathogenicity). No antibiotic treatment is indicated for blood  culture contaminants. CRITICAL RESULT CALLED TO, READ BACK BY AND VERIFIED WITH: PHARMD JENNY ZHOU [id number], ADC    Staphylococcus lugdunensis  NOT DETECTED NOT DETECTED Final   Streptococcus species NOT DETECTED NOT DETECTED Final   Streptococcus agalactiae NOT DETECTED NOT DETECTED Final   Streptococcus pneumoniae NOT DETECTED NOT DETECTED Final   Streptococcus pyogenes NOT DETECTED NOT DETECTED Final   A.calcoaceticus-baumannii NOT DETECTED NOT DETECTED Final   Bacteroides fragilis NOT DETECTED NOT DETECTED Final   Enterobacterales NOT DETECTED NOT DETECTED Final   Enterobacter cloacae complex NOT DETECTED NOT DETECTED Final   Escherichia coli NOT DETECTED NOT DETECTED Final   Klebsiella aerogenes NOT DETECTED NOT DETECTED Final   Klebsiella oxytoca NOT DETECTED NOT DETECTED Final   Klebsiella pneumoniae NOT DETECTED NOT DETECTED Final   Proteus species NOT DETECTED NOT DETECTED Final   Salmonella species NOT DETECTED NOT DETECTED Final   Serratia marcescens NOT DETECTED NOT DETECTED Final   Haemophilus influenzae NOT DETECTED NOT DETECTED Final   Neisseria meningitidis NOT DETECTED NOT DETECTED Final   Pseudomonas aeruginosa NOT DETECTED NOT DETECTED Final   Stenotrophomonas maltophilia NOT DETECTED NOT DETECTED Final   Candida albicans NOT DETECTED NOT DETECTED Final   Candida auris NOT DETECTED NOT DETECTED Final   Candida glabrata NOT DETECTED NOT DETECTED Final   Candida krusei NOT DETECTED NOT DETECTED Final   Candida parapsilosis NOT DETECTED NOT DETECTED Final   Candida tropicalis NOT DETECTED NOT DETECTED Final   Cryptococcus neoformans/gattii NOT DETECTED NOT DETECTED Final   Methicillin resistance mecA/C DETECTED (A) NOT DETECTED Final    Comment: CRITICAL RESULT CALLED TO, READ BACK BY AND VERIFIED WITH: PHARMD JENNY ZHOU [phone], ADC Performed at [facility], [address] [place], [place] [address]   MRSA Next Gen by PCR,  Nasal     Status: None   Collection Time: 07/09/23  6:42 PM   Specimen: Nasal Mucosa; Nasal Swab  Result Value Ref Range Status   MRSA by PCR Next Gen NOT DETECTED NOT  DETECTED Final    Comment: (NOTE) The GeneXpert MRSA Assay (FDA approved for NASAL specimens only), is one component of a comprehensive MRSA colonization surveillance program. It is not intended to diagnose MRSA infection nor to guide or monitor treatment for MRSA infections. Test performance is not FDA approved in patients less than 65 years old. Performed at [facility], [address], [place], [place] [address]   Culture, blood (Routine X 2) w Reflex to ID Panel     Status: None (Preliminary result)   Collection Time: 07/11/23  3:47 PM   Specimen: BLOOD RIGHT ARM  Result Value Ref Range Status   Specimen Description BLOOD RIGHT ARM  Final   Special Requests   Final    BOTTLES DRAWN AEROBIC ONLY Blood Culture results may not be optimal due to an inadequate volume of blood received in culture bottles   Culture   Final    NO GROWTH < 24 HOURS Performed at [facility], [address], [place], [place] [address]    Report Status PENDING  Incomplete  Culture, blood (Routine X 2) w Reflex to ID Panel     Status: None (Preliminary result)   Collection Time: 07/11/23  3:49 PM   Specimen: BLOOD RIGHT ARM  Result Value Ref Range Status   Specimen Description BLOOD RIGHT ARM  Final   Special Requests   Final    BOTTLES DRAWN AEROBIC ONLY Blood Culture results may not be optimal due to an inadequate volume of blood received in culture bottles   Culture   Final    NO GROWTH < 24 HOURS Performed at [facility], [address], [place], [place] [address]    Report Status PENDING  Incomplete  Surgical pcr screen     Status: None   Collection Time: 07/12/23  4:46 AM   Specimen: Nasal Mucosa; Nasal Swab  Result Value Ref Range Status   MRSA, PCR NEGATIVE NEGATIVE Final   Staphylococcus aureus NEGATIVE NEGATIVE Final    Comment: (NOTE) The Xpert SA Assay (FDA approved for NASAL specimens in patients 101 years of age and older), is one component of a  comprehensive surveillance program. It is not intended to diagnose infection nor to guide or monitor treatment. Performed at [facility], [address] [place], [place] [address]     Studies/Results: No results found.    Assessment/Plan:  INTERVAL HISTORY: Dr. 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 male with stage renal disease on hemodialysis currently through tunneled catheter on the right side who now is admitted with Staphylococcus epidermidis uremia in the context of C6-7 discitis osteomyelitis with severe spinal canal stenosis.  #1 Staph epidermidis bacteremia which is likely culprit for his C spine pathology  His bacteremia is life threatenting and his C spine infection could lead progress to paralysis.  He will be monitored closely and as I mentioned above it is my understanding that neurosurgery are planning on stabilizing his C-spine on Monday. ESR  and CRP up at 63 and 7.9  In order to facilitate clearance of his bacteremia he will need a central line holiday and his hemodialysis catheter is going to be removed today.  Reviewed the blood culture data and the Staph epidermidis is oxacillin resistant.  We are having the other culture worked up because I am compulsive and want to make sure these are truly the offending organisms--which I suspect they indeed are  I will check repeat blood cultures after HD catheter removal.  The echocardiogram has been performed we would not has a TEE.  We are going to treat him with at least 6 weeks of parenteral antibiotics.  I am available for questions this weekend and will follow-up on his culture data and clinical status check on him at least 1 day this weekend    LOS: 3 days   [name] 07/12/2023, 3:28 PM

## 2023-07-12 NOTE — Procedures (Signed)
 Interventional Radiology Procedure Note  Procedure:   Replacement of right internal jugular approach tunneled HD catheter.  New 23 cm tip to cuff.  Tip is positioned at the superior cavoatrial junction and catheter is ready for immediate use.   Findings: Fibrin sheath surrounds the catheter.  Occluded SVC secondary to the fibrin sheath.  If removed, will close with no further option of right neck access.  PTA was performed to create pathway for catheter exchange.   Complications: None  Recommendations:  - Ok to use - Do not submerge - Routine line care  - routine wound care - would not remove this catheter for line holiday, as the tunnel (fibrin sheath) will close  Signed,  Ami RAMAN. Alona, DO, ABVM, RPVI

## 2023-07-12 NOTE — Plan of Care (Signed)

## 2023-07-12 NOTE — Progress Notes (Signed)
 Subjective: Patient reports doing well condition of right tricep discomfort but neck pain is manageable denies any new numbness or tingling  Objective: Vital signs in last 24 hours: Temp:  [97.6 F (36.4 C)-98.3 F (36.8 C)] 98 F (36.7 C) (01/03 1225) Pulse Rate:  [72-90] 90 (01/03 1225) Resp:  [10-20] 19 (01/03 1225) BP: (155-180)/(62-97) 170/72 (01/03 1225) SpO2:  [97 %-100 %] 97 % (01/03 1225) Weight:  [67 kg] 67 kg (01/03 0728)  Intake/Output from previous day: 01/02 0701 - 01/03 0700 In: 360 [P.O.:360] Out: 0  Intake/Output this shift: Total I/O In: -  Out: 1800 [Other:1800]  Weak tricep 4 out of 5 weak right grip lower extremity function remained stable and strong at 4+ out of 5 neurologic exam overall unchanged  Lab Results: Recent Labs    07/09/23 1920  WBC 5.6  HGB 9.7*  HCT 30.9*  PLT 340   BMET Recent Labs    07/09/23 1920 07/11/23 0455  NA 135 132*  K 3.3* 3.7  CL 97* 95*  CO2 24 23  GLUCOSE 97 119*  BUN 23 15  CREATININE 6.82* 5.14*  CALCIUM  8.6* 8.7*    Studies/Results: No results found.  Assessment/Plan: Dr. Louis planning on stabilization on Monday continue supportive care cervical collar.  LOS: 3 days     Shawn [name] 07/12/2023, 2:35 PM

## 2023-07-12 NOTE — Progress Notes (Signed)
   Providing Compassionate, Quality Care - Together   Subjective: Patient reports no new issues. He is about to have his catheter removed in IR.  Objective: Vital signs in last 24 hours: Temp:  [97.6 F (36.4 C)-98.3 F (36.8 C)] 98 F (36.7 C) (01/03 1225) Pulse Rate:  [72-90] 90 (01/03 1225) Resp:  [10-20] 19 (01/03 1225) BP: (155-180)/(62-97) 170/72 (01/03 1225) SpO2:  [97 %-100 %] 97 % (01/03 1225) Weight:  [67 kg] 67 kg (01/03 0728)  Intake/Output from previous day: 01/02 0701 - 01/03 0700 In: 360 [P.O.:360] Out: 0  Intake/Output this shift: Total I/O In: -  Out: 1800 [Other:1800]  Alert and oriented x 4 PERRLA CN II-XII grossly intact Speech clear Hard cervical collar in place MAE, Right triceps weakness  Lab Results: Recent Labs    07/09/23 1920  WBC 5.6  HGB 9.7*  HCT 30.9*  PLT 340   BMET Recent Labs    07/09/23 1920 07/11/23 0455  NA 135 132*  K 3.3* 3.7  CL 97* 95*  CO2 24 23  GLUCOSE 97 119*  BUN 23 15  CREATININE 6.82* 5.14*  CALCIUM  8.6* 8.7*    Studies/Results: No results found.  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 Continue antibiotics. C6 and C7 corpectomy with C5 to T1 anterior and posterior fusion planned for Monday. This surgery was described to the patient. We have discussed the risks of surgery including the risks of anesthesia, medical risks, hoarseness, hemorrhage, infection, spinal fluid leak, nerve injury, failure to relieve the pain, worsening of the pain, etc. We also have discussed the need for realistic expectations regarding the outcome of surgery.  I have answered all of his questions. He voiced understanding and wishes to proceed with surgery.   LOS: 3 days   -Surgery scheduled for Monday afternoon. Patient should be NPO at midnight prior to surgery.   Gerard Beck, DNP, AGNP-C Nurse Practitioner  [facility] [address], [place], [place]  [address] P: [phone]    F: [phone]  07/12/2023, 1:14 PM

## 2023-07-13 DIAGNOSIS — M4622 Osteomyelitis of vertebra, cervical region: Secondary | ICD-10-CM | POA: Diagnosis not present

## 2023-07-13 LAB — CBC WITH DIFFERENTIAL/PLATELET
Abs Immature Granulocytes: 0.03 10*3/uL (ref 0.00–0.07)
Basophils Absolute: 0 10*3/uL (ref 0.0–0.1)
Basophils Relative: 1 %
Eosinophils Absolute: 0.9 10*3/uL — ABNORMAL HIGH (ref 0.0–0.5)
Eosinophils Relative: 15 %
HCT: 31.5 % — ABNORMAL LOW (ref 39.0–52.0)
Hemoglobin: 9.9 g/dL — ABNORMAL LOW (ref 13.0–17.0)
Immature Granulocytes: 1 %
Lymphocytes Relative: 11 %
Lymphs Abs: 0.7 10*3/uL (ref 0.7–4.0)
MCH: 26.4 pg (ref 26.0–34.0)
MCHC: 31.4 g/dL (ref 30.0–36.0)
MCV: 84 fL (ref 80.0–100.0)
Monocytes Absolute: 0.5 10*3/uL (ref 0.1–1.0)
Monocytes Relative: 8 %
Neutro Abs: 4.1 10*3/uL (ref 1.7–7.7)
Neutrophils Relative %: 64 %
Platelets: 313 10*3/uL (ref 150–400)
RBC: 3.75 MIL/uL — ABNORMAL LOW (ref 4.22–5.81)
RDW: 16.8 % — ABNORMAL HIGH (ref 11.5–15.5)
WBC: 6.3 10*3/uL (ref 4.0–10.5)
nRBC: 0 % (ref 0.0–0.2)

## 2023-07-13 LAB — GLUCOSE, CAPILLARY
Glucose-Capillary: 130 mg/dL — ABNORMAL HIGH (ref 70–99)
Glucose-Capillary: 177 mg/dL — ABNORMAL HIGH (ref 70–99)
Glucose-Capillary: 217 mg/dL — ABNORMAL HIGH (ref 70–99)
Glucose-Capillary: 240 mg/dL — ABNORMAL HIGH (ref 70–99)
Glucose-Capillary: 115 mg/dL — ABNORMAL HIGH (ref 70–99)
Glucose-Capillary: 128 mg/dL — ABNORMAL HIGH (ref 70–99)
Glucose-Capillary: 109 mg/dL — ABNORMAL HIGH (ref 70–99)

## 2023-07-13 LAB — BASIC METABOLIC PANEL
Anion gap: 12 (ref 5–15)
BUN: 18 mg/dL (ref 8–23)
CO2: 25 mmol/L (ref 22–32)
Calcium: 8.5 mg/dL — ABNORMAL LOW (ref 8.9–10.3)
Chloride: 96 mmol/L — ABNORMAL LOW (ref 98–111)
Creatinine, Ser: 4.83 mg/dL — ABNORMAL HIGH (ref 0.61–1.24)
GFR, Estimated: 13 mL/min — ABNORMAL LOW (ref >60)
Glucose, Bld: 206 mg/dL — ABNORMAL HIGH (ref 70–99)
Potassium: 3.8 mmol/L (ref 3.5–5.1)
Sodium: 133 mmol/L — ABNORMAL LOW (ref 135–145)

## 2023-07-13 LAB — CULTURE, BLOOD (ROUTINE X 2): Special Requests: ADEQUATE

## 2023-07-13 NOTE — Progress Notes (Signed)
 Shawn [name] Progress Note   Subjective:  Seen in room. Had catheter exchange in IR yesterday -unable to remove catheter d/t occluded SVC, no further access if removed.  Neurosurgery planning C-spine surgery Monday. Completed dialysis yesterday.  Some neck pain if he tries to move.   Objective Vitals:   07/12/23 2206 07/12/23 2218 07/13/23 0522 07/13/23 0940  BP: (!) 152/65 (!) 152/65 (!) 140/60 (!) 141/60  Pulse:  85 72 72  Resp:  18 18 18   Temp:  98.4 F (36.9 C) 98.8 F (37.1 C) 98.2 F (36.8 C)  TempSrc:  Oral Oral Oral  SpO2:  98% 100% 100%  Weight:      Height:       Physical Exam General: Lying in bed, in C-collar, nad  Heart: RRR, no murmurs, rubs or gallops Lungs: CTA bilaterally, respirations unlabored on RA Abdomen: Soft, non-distended  Extremities: No edema b/l lower extremities  Dialysis Access: R internal jugular TDC in place   Additional Objective Labs: Basic Metabolic Panel: Recent Labs  Lab 07/09/23 1920 07/11/23 0455 07/12/23 1303 07/13/23 0527  NA 135 132* 132* 133*  K 3.3* 3.7 3.4* 3.8  CL 97* 95* 94* 96*  CO2 24 23 24 25   GLUCOSE 97 119* 80 206*  BUN 23 15 12 18   CREATININE 6.82* 5.14* 3.54* 4.83*  CALCIUM  8.6* 8.7* 9.0 8.5*  PHOS 3.9  --   --   --    Liver Function Tests: Recent Labs  Lab 07/09/23 1920  ALBUMIN  2.7*   No results for input(s): LIPASE, AMYLASE in the last 168 hours. CBC: Recent Labs  Lab 07/09/23 1150 07/09/23 1920 07/13/23 0527  WBC 6.0 5.6 6.3  NEUTROABS  --   --  4.1  HGB 11.5* 9.7* 9.9*  HCT 36.9* 30.9* 31.5*  MCV 84.4 84.4 84.0  PLT 363 340 313   Blood Culture    Component Value Date/Time   SDES BLOOD RIGHT ARM 07/11/2023 1549   SPECREQUEST  07/11/2023 1549    BOTTLES DRAWN AEROBIC ONLY Blood Culture results may not be optimal due to an inadequate volume of blood received in culture bottles   CULT  07/11/2023 1549    NO GROWTH 2 DAYS Performed at [facility], [address], [place], [place] [address]    REPTSTATUS PENDING 07/11/2023 1549    Cardiac Enzymes: No results for input(s): CKTOTAL, CKMB, CKMBINDEX, TROPONINI in the last 168 hours. CBG: Recent Labs  Lab 07/12/23 2326 07/13/23 0105 07/13/23 0204 07/13/23 0311 07/13/23 0713  GLUCAP 80 130* 177* 217* 240*   Iron Studies: No results for input(s): IRON, TIBC, TRANSFERRIN, FERRITIN in the last 72 hours. @lablastinr3 @ Studies/Results: IR Fluoro Guide CV Line Right Result Date: 07/13/2023 INDICATION: 63 year old male with a history of renal failure, presents with bacteremia/infection and request for a line holiday. Cervical MR 07/09/2023 demonstrates discitis/osteomyelitis C6-C7 and associated severe spinal stenosis. Upon review of prior images, it appears that the patient has limited upper extremity venous access, with evidence of occluded internal jugular veins bilaterally. Prior chest CT 12/19/2022 demonstrates an occlusive fibrin sheath of the right brachiocephalic vein above the SVC (image 46/122 series 7). Plan is for exchange for a new catheter at the right IJ site and treatment through the infection. EXAM: IMAGE GUIDED EXCHANGE OF RIGHT INTERNAL JUGULAR VEIN TUNNELED HEMODIALYSIS CATHETER BALLOON ANGIOPLASTY OCCLUSIVE FIBRIN SHEATH LIMITED VENOGRAM MEDICATIONS: 2 g Ancef . The antibiotic was given in an appropriate time interval prior to skin puncture. ANESTHESIA/SEDATION:  Moderate (conscious) sedation was not employed during this procedure. FLUOROSCOPY: Radiation Exposure Index (as provided by the fluoroscopic device): 17 mGy Kerma COMPLICATIONS: None PROCEDURE: Informed written consent was obtained from the patient after a discussion of the risks, benefits, and alternatives to treatment. Questions regarding the procedure were encouraged and answered. The right neck and chest including indwelling catheter were prepped with chlorhexidine  in a sterile fashion, and a sterile drape was applied  covering the operative field. Maximum barrier sterile technique with sterile gowns and gloves were used for the procedure. A timeout was performed prior to the initiation of the procedure. Upon initial inspection the subcutaneous tunnel is quite short, at the level of the clavicle just above the IJ puncture site. Initial images stored. 1% lidocaine  was used for local anesthesia. Both the red and blue ports were aspirated clearing the heparin . Aspirated easily. A stiff Glidewire was advanced to the IVC. A pull-back wire technique was used to estimate the internal length, which was a proximally 24 cm. We elected to place a 23 cm tip to cuff catheter. Glidewire was then again advanced to the IVC. Blunt dissection was used to free the cuff from the soft tissue tract. The catheter was withdrawn with some significant tension, apparently from the occlusive fibrin sheath of the brachiocephalic vein. Once the catheter was removed, the new 23 cm tip to cuff catheter was advanced on the wire. The catheter would not advanced through the occlusive fibrin sheath. Balloon angioplasty was required. A 9 French sheath was advanced on the Glidewire. A pull-back venogram was performed, both under fluoroscopic observation and also with DSA imaging. This demonstrates the occlusive fibrin sheath at the brachiocephalic vein. 10 mm x 40 mm balloon was then advanced into the fibrin sheath. The balloon was inflated to profile, with maximum atmospheric pressure of 12 atmospheres achieved. There was a small waist persisting in the SVC fibrin sheath, approximately 25-30% diameter reduction based on the profile of the balloon. Balloon was then removed. After the balloon angioplasty the new catheter advanced easily through the brachiocephalic vein. Final catheter positioning was confirmed and documented with a spot radiographic image. The catheter aspirates and flushes normally. The catheter was flushed with appropriate volume heparin  dwells. The  catheter exit site was secured with a 0-Prolene retention suture. Dressings were applied. The patient tolerated the procedure well without immediate post procedural complication. IMPRESSION: Status post image guided exchange of right IJ tunneled hemodialysis catheter, requiring limited venogram and balloon angioplasty of occlusive fibrin sheath at the right brachiocephalic vein as above. Would advise careful consideration of any removal of this catheter without a planned angioplasty or other plan. Potentially a new right external jugular vein access site would be possible using a buddy wire/safety wire technique through the occlusive fibrin sheath. Signed, Ami RAMAN. Alona ROSALEA GRAVER, RPVI [facility] Radiology Electronically Signed   By: Ami Alona D.O.   On: 07/13/2023 07:38   IR PTA VENOUS EXCEPT DIALYSIS CIRCUIT Result Date: 07/13/2023 INDICATION: 63 year old male with a history of renal failure, presents with bacteremia/infection and request for a line holiday. Cervical MR 07/09/2023 demonstrates discitis/osteomyelitis C6-C7 and associated severe spinal stenosis. Upon review of prior images, it appears that the patient has limited upper extremity venous access, with evidence of occluded internal jugular veins bilaterally. Prior chest CT 12/19/2022 demonstrates an occlusive fibrin sheath of the right brachiocephalic vein above the SVC (image 46/122 series 7). Plan is for exchange for a new catheter at the right  IJ site and treatment through the infection. EXAM: IMAGE GUIDED EXCHANGE OF RIGHT INTERNAL JUGULAR VEIN TUNNELED HEMODIALYSIS CATHETER BALLOON ANGIOPLASTY OCCLUSIVE FIBRIN SHEATH LIMITED VENOGRAM MEDICATIONS: 2 g Ancef . The antibiotic was given in an appropriate time interval prior to skin puncture. ANESTHESIA/SEDATION: Moderate (conscious) sedation was not employed during this procedure. FLUOROSCOPY: Radiation Exposure Index (as provided by the  fluoroscopic device): 17 mGy Kerma COMPLICATIONS: None PROCEDURE: Informed written consent was obtained from the patient after a discussion of the risks, benefits, and alternatives to treatment. Questions regarding the procedure were encouraged and answered. The right neck and chest including indwelling catheter were prepped with chlorhexidine  in a sterile fashion, and a sterile drape was applied covering the operative field. Maximum barrier sterile technique with sterile gowns and gloves were used for the procedure. A timeout was performed prior to the initiation of the procedure. Upon initial inspection the subcutaneous tunnel is quite short, at the level of the clavicle just above the IJ puncture site. Initial images stored. 1% lidocaine  was used for local anesthesia. Both the red and blue ports were aspirated clearing the heparin . Aspirated easily. A stiff Glidewire was advanced to the IVC. A pull-back wire technique was used to estimate the internal length, which was a proximally 24 cm. We elected to place a 23 cm tip to cuff catheter. Glidewire was then again advanced to the IVC. Blunt dissection was used to free the cuff from the soft tissue tract. The catheter was withdrawn with some significant tension, apparently from the occlusive fibrin sheath of the brachiocephalic vein. Once the catheter was removed, the new 23 cm tip to cuff catheter was advanced on the wire. The catheter would not advanced through the occlusive fibrin sheath. Balloon angioplasty was required. A 9 French sheath was advanced on the Glidewire. A pull-back venogram was performed, both under fluoroscopic observation and also with DSA imaging. This demonstrates the occlusive fibrin sheath at the brachiocephalic vein. 10 mm x 40 mm balloon was then advanced into the fibrin sheath. The balloon was inflated to profile, with maximum atmospheric pressure of 12 atmospheres achieved. There was a small waist persisting in the SVC fibrin sheath,  approximately 25-30% diameter reduction based on the profile of the balloon. Balloon was then removed. After the balloon angioplasty the new catheter advanced easily through the brachiocephalic vein. Final catheter positioning was confirmed and documented with a spot radiographic image. The catheter aspirates and flushes normally. The catheter was flushed with appropriate volume heparin  dwells. The catheter exit site was secured with a 0-Prolene retention suture. Dressings were applied. The patient tolerated the procedure well without immediate post procedural complication. IMPRESSION: Status post image guided exchange of right IJ tunneled hemodialysis catheter, requiring limited venogram and balloon angioplasty of occlusive fibrin sheath at the right brachiocephalic vein as above. Would advise careful consideration of any removal of this catheter without a planned angioplasty or other plan. Potentially a new right external jugular vein access site would be possible using a buddy wire/safety wire technique through the occlusive fibrin sheath. Signed, Ami RAMAN. Alona ROSALEA GRAVER, RPVI [facility] Radiology Electronically Signed   By: Ami Alona D.O.   On: 07/13/2023 07:38   ECHOCARDIOGRAM COMPLETE Result Date: 07/12/2023    ECHOCARDIOGRAM REPORT   Patient Name:   Shawn [name] Date of Exam: 07/12/2023 Medical Rec #:  [id number]            Height:       71.0 in Accession #:  [id number]           Weight:       147.7 lb Date of Birth:  Jan 05, 1961            BSA:          1.854 m Patient Age:    62 years             BP:           169/78 mmHg Patient Gender: M                    HR:           90 bpm. Exam Location:  Inpatient Procedure: 2D Echo, 3D Echo, Cardiac Doppler, Color Doppler, Strain Analysis and            Intracardiac Opacification Agent Indications:    Bacteremia  History:        Patient has no prior history of Echocardiogram examinations.                  Risk Factors:Diabetes and Dyslipidemia.  Sonographer:    Ozell Free Referring Phys: [id number] CORNELIUS N VAN DAM  Sonographer Comments: No subcostal window. Image acquisition challenging due to patient body habitus. Global longitudinal strain was attempted. IMPRESSIONS  1. Left ventricular ejection fraction, by estimation, is 25 to 30%. The left ventricle has severely decreased function. The left ventricle demonstrates global hypokinesis. Indeterminate diastolic filling due to E-A fusion.  2. Right ventricular systolic function is mildly reduced. The right ventricular size is normal. There is severely elevated pulmonary artery systolic pressure.  3. Left atrial size was mildly dilated.  4. The mitral valve is normal in structure. No evidence of mitral valve regurgitation. No evidence of mitral stenosis.  5. The aortic valve is tricuspid. There is moderate calcification of the aortic valve. There is mild thickening of the aortic valve. Aortic valve regurgitation is not visualized. Aortic valve sclerosis/calcification is present, without any evidence of aortic stenosis. Conclusion(s)/Recommendation(s): No evidence of valvular vegetations on this transthoracic echocardiogram. Consider a transesophageal echocardiogram to exclude infective endocarditis if clinically indicated. FINDINGS  Left Ventricle: Left ventricular ejection fraction, by estimation, is 25 to 30%. The left ventricle has severely decreased function. The left ventricle demonstrates global hypokinesis. Definity  contrast agent was given IV to delineate the left ventricular endocardial borders. The left ventricular internal cavity size was normal in size. There is no left ventricular hypertrophy. Indeterminate diastolic filling due to E-A fusion. Right Ventricle: The right ventricular size is normal. No increase in right ventricular wall thickness. Right ventricular systolic function is mildly reduced. There is severely elevated pulmonary artery systolic  pressure. The tricuspid regurgitant velocity is 3.85 m/s, and with an assumed right atrial pressure of 3 mmHg, the estimated right ventricular systolic pressure is 62.3 mmHg. Left Atrium: Left atrial size was mildly dilated. Right Atrium: Right atrial size was normal in size. Pericardium: There is no evidence of pericardial effusion. Mitral Valve: The mitral valve is normal in structure. There is mild calcification of the mitral valve leaflet(s). No evidence of mitral valve regurgitation. No evidence of mitral valve stenosis. Tricuspid Valve: The tricuspid valve is normal in structure. Tricuspid valve regurgitation is not demonstrated. No evidence of tricuspid stenosis. Aortic Valve: The aortic valve is tricuspid. There is moderate calcification of the aortic valve. There is mild thickening of the aortic valve. There is moderate aortic valve annular calcification. Aortic valve regurgitation is not visualized. Aortic valve sclerosis/calcification  is present, without any evidence of aortic stenosis. Aortic valve mean gradient measures 5.0 mmHg. Aortic valve peak gradient measures 8.8 mmHg. Aortic valve area, by VTI measures 1.66 cm. Pulmonic Valve: The pulmonic valve was not well visualized. Pulmonic valve regurgitation is trivial. No evidence of pulmonic stenosis. Aorta: The aortic root is normal in size and structure. Venous: The inferior vena cava was not well visualized. IAS/Shunts: No atrial level shunt detected by color flow Doppler.  LEFT VENTRICLE PLAX 2D LVIDd:         5.60 cm      Diastology LVIDs:         4.50 cm      LV e' medial:    4.03 cm/s LV PW:         0.90 cm      LV E/e' medial:  18.5 LV IVS:        0.80 cm      LV e' lateral:   3.59 cm/s LVOT diam:     2.00 cm      LV E/e' lateral: 20.8 LV SV:         49 LV SV Index:   26 LVOT Area:     3.14 cm                              3D Volume EF: LV Volumes (MOD)            3D EF:        28 % LV vol d, MOD A2C: 133.0 ml LV EDV:       205 ml LV vol d, MOD  A4C: 111.0 ml LV ESV:       148 ml LV vol s, MOD A2C: 90.7 ml  LV SV:        57 ml LV vol s, MOD A4C: 70.2 ml LV SV MOD A2C:     42.3 ml LV SV MOD A4C:     111.0 ml LV SV MOD BP:      37.0 ml RIGHT VENTRICLE RV Basal diam:  3.70 cm RV S prime:     11.60 cm/s TAPSE (M-mode): 2.0 cm LEFT ATRIUM             Index        RIGHT ATRIUM           Index LA diam:        3.50 cm 1.89 cm/m   RA Area:     12.30 cm LA Vol (A2C):   64.9 ml 35.01 ml/m  RA Volume:   33.00 ml  17.80 ml/m LA Vol (A4C):   41.2 ml 22.23 ml/m LA Biplane Vol: 56.1 ml 30.26 ml/m  AORTIC VALVE AV Area (Vmax):    1.80 cm AV Area (Vmean):   1.56 cm AV Area (VTI):     1.66 cm AV Vmax:           148.00 cm/s AV Vmean:          104.000 cm/s AV VTI:            0.293 m AV Peak Grad:      8.8 mmHg AV Mean Grad:      5.0 mmHg LVOT Vmax:         84.90 cm/s LVOT Vmean:        51.500 cm/s LVOT VTI:          0.155 m LVOT/AV  VTI ratio: 0.53  AORTA Ao Root diam: 2.80 cm Ao Asc diam:  2.40 cm MITRAL VALVE                TRICUSPID VALVE MV Area (PHT): 9.73 cm     TR Peak grad:   59.3 mmHg MV Decel Time: 78 msec      TR Vmax:        385.00 cm/s MV E velocity: 74.60 cm/s MV A velocity: 119.00 cm/s  SHUNTS MV E/A ratio:  0.63         Systemic VTI:  0.16 m                             Systemic Diam: 2.00 cm Toribio Fuel MD Electronically signed by Toribio Fuel MD Signature Date/Time: 07/12/2023/3:35:11 PM    Final    Medications:  vancomycin  (VANCOCIN ) 750 mg in sodium chloride  0.9 % 250 mL IVPB Stopped (07/12/23 1239)    amLODipine   5 mg Oral Daily   aspirin  EC  325 mg Oral QHS   atorvastatin   80 mg Oral Daily   brimonidine   1 drop Both Eyes BID   calcitRIOL   0.5 mcg Oral Q M,W,F   Chlorhexidine  Gluconate Cloth  6 each Topical Q0600   dorzolamide -timolol   1 drop Both Eyes BID   heparin   5,000 Units Subcutaneous Q8H   insulin  aspart  0-9 Units Subcutaneous TID WC   insulin  glargine-yfgn  10 Units Subcutaneous QHS   lanthanum   1,000 mg Oral TID WC    latanoprost   1 drop Both Eyes QHS   lisinopril   20 mg Oral BID    Dialysis Orders: Center: [facility]  on MWF. 180Nre 3hr 30 min BFR 400 DFR Auto 1.5 EDW 69.9kg 2K 2.5Ca TDC Heparin  6500 unit bolus and 2900 unit bolus mid HD Mircera 120mcg IV q 2 weeks- last dose 06/30/23 Calcitriol  0.5mcg PO q HD    Assessment/Plan: Cervical discitis/osteomyelitis. Blood cx+ Staph epi.  ID following  On IV vancomycin . Has TDC - line holiday requested. IR unable to remove TDC. Limited access options if removed. TDC was exchanged 07/12/23. ID guiding antibiotics. Repeat cultures pending.    Cervical spine stenosis - Neurosurgery following. For surgery Monday.  ESRD:  On dialysis MWF. Next HD Mon.  Hypertension/volume: BP slightly elevated, euvolemic on exam. Below prior EDW, will need to be lowered at discharge. Continue home BP meds  Anemia: Hgb 9.7. ESA recently dosed. Holding IV Fe in setting of acute infection.   Metabolic bone disease: Calcium  controlled. Follow Phos here. Continue calcitriol  and binders   T2DM: With retinopathy. Management per primary team    Maisie Ronnald Acosta PA-C [name] 07/13/2023,10:16 AM

## 2023-07-13 NOTE — Plan of Care (Signed)

## 2023-07-13 NOTE — Progress Notes (Signed)
 HOSPITALIST                ROUNDING                 NOTE Shawn [name] FMW:[id number]  DOB: 1960-12-25  DOA: 07/08/2023  PCP: Hillman Bare, MD  07/13/2023,10:11 AM   LOS: 4 days      Code Status:  full code  From: Home  current Dispo: Unclear     63 year old black male ESRD MWF since 2011 right chest TDC-complicated by orthostatic hypotension/hypotension and bradycardic arrest in 2020 HTN DM TY 2 HLD Bilateral blindness Previous right chylothorax in 2020  Brought to emergency room 12/31 with 2-week neck pain radiating down upper back without trauma fever chills other signs of infection some tingling right hand--sodium 3.2 BUN/creatinine 20/6.2 WBC 6.0 heme 363 CXR 1 view small bilateral right greater than left pleural effusions streaky opacities right lung ESR 63, CRP 7.8 MRI = vertebral discitis osteomyelitis C6/C7 -- vertebral body height loss and retropulsion with severe canal stenosis and compression of spinal cord--  Neurosurgeon Dr. Malcolm consulted Renal consulted  Proced 1/3 echocardiogram EF 25 to 30% severe decrease in global hypokinesis indeterminate diastolic filling no evidence of valvular disease-  Attempt at 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  Defer to NS Dr. Louis Echo as above  Pain controlled overall on Tylenol  650 mild pain, Dilaudid  0.5 every 6 for severe  ESRD MWF through right TDC line Very restricted dialysis access--- seen note 07/10/2023 continue Fosrenal 1 g 3 times daily, Rocaltrol  0.5 MWF   DM TY 2 A1c 5.8-Tresiba  usually 12 at  bedtime Hypoglycemic episode 1/4 secondary to poor p.o. intake-now resolved Can continue Semglee  10-continues on 3 times daily sliding scale for now  Hypertension with history of orthostatic hypotension secondary to beta-blocker Continues on amlodipine  5, Zestril  20 twice daily-blood pressures are stable  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 retinopathy Continue all eyedrops  History of CVA previously  DVT prophylaxis: Heparin   Status is: Inpatient Remains inpatient appropriate because:      Subjective:  Doing fair no distress looks relatively comfortable No event with hypoglycemia overnight No chest pain-right arm seems about as strong as left  Objective + exam Vitals:   07/12/23 2206 07/12/23 2218 07/13/23 0522 07/13/23 0940  BP: (!) 152/65 (!) 152/65 (!) 140/60 (!) 141/60  Pulse:  85 72 72  Resp:  18 18 18   Temp:  98.4 F (36.9 C) 98.8 F (37.1 C) 98.2 F (36.8 C)  TempSrc:  Oral Oral Oral  SpO2:  98% 100% 100%  Weight:      Height:       Filed Weights   07/10/23 0810 07/10/23 1214 07/12/23 0728  Weight: 67.5 kg 65.5 kg 67 kg    Examination:  EOMI NCAT no focal deficit no icterus no pallor-poor visual acuity Chest is clear no wheeze rales rhonchi ROM is intact moving 4 limbs equally although right is a little bit weaker with extension of right arm Abdomen is soft nontender no rebound No lower extremity edema  Data Reviewed: reviewed   CBC    Component Value Date/Time   WBC 6.3 07/13/2023 0527  RBC 3.75 (L) 07/13/2023 0527   HGB 9.9 (L) 07/13/2023 0527   HCT 31.5 (L) 07/13/2023 0527   PLT 313 07/13/2023 0527   MCV 84.0 07/13/2023 0527   MCH 26.4 07/13/2023 0527   MCHC 31.4 07/13/2023 0527   RDW 16.8 (H) 07/13/2023 0527   LYMPHSABS 0.7 07/13/2023 0527   MONOABS 0.5 07/13/2023 0527   EOSABS 0.9 (H) 07/13/2023 0527   BASOSABS 0.0 07/13/2023 0527      Latest Ref Rng & Units 07/13/2023    5:27 AM 07/12/2023    1:03 PM 07/11/2023    4:55 AM   CMP  Glucose 70 - 99 mg/dL 793  80  880   BUN 8 - 23 mg/dL 18  12  15    Creatinine 0.61 - 1.24 mg/dL 5.16  6.45  4.85   Sodium 135 - 145 mmol/L 133  132  132   Potassium 3.5 - 5.1 mmol/L 3.8  3.4  3.7   Chloride 98 - 111 mmol/L 96  94  95   CO2 22 - 32 mmol/L 25  24  23    Calcium  8.9 - 10.3 mg/dL 8.5  9.0  8.7     Scheduled Meds:  amLODipine   5 mg Oral Daily   aspirin  EC  325 mg Oral QHS   atorvastatin   80 mg Oral Daily   brimonidine   1 drop Both Eyes BID   calcitRIOL   0.5 mcg Oral Q M,W,F   Chlorhexidine  Gluconate Cloth  6 each Topical Q0600   dorzolamide -timolol   1 drop Both Eyes BID   heparin   5,000 Units Subcutaneous Q8H   insulin  aspart  0-9 Units Subcutaneous TID WC   insulin  glargine-yfgn  10 Units Subcutaneous QHS   lanthanum   1,000 mg Oral TID WC   latanoprost   1 drop Both Eyes QHS   lisinopril   20 mg Oral BID   Continuous Infusions:  vancomycin  (VANCOCIN ) 750 mg in sodium chloride  0.9 % 250 mL IVPB Stopped (07/12/23 1239)    Time  24  Colen Grimes, MD  [facility]

## 2023-07-13 NOTE — Progress Notes (Signed)
  NEUROSURGERY PROGRESS NOTE   No issues overnight. Unchanged neck and right arm pain.  EXAM:  BP (!) 141/60 (BP Location: Right Arm)   Pulse 72   Temp 98.2 F (36.8 C) (Oral)   Resp 18   Ht 5' 11 (1.803 m)   Wt 67 kg   SpO2 100%   BMI 20.60 kg/m   Awake, alert, oriented  Speech fluent, appropriate  CN grossly intact  5/5 BUE/BLE x right tricep weakness 3/5   IMPRESSION:  63 y.o. male with destructive lesion C6-7 with associated deformity and spinal cord compression requiring circumferential deocmpression/stabilization  PLAN: - Cont Cervical collar immobilization at all times - Plan for surgery on Monday with Dr. Louis Gerldine Maizes, MD [facility]

## 2023-07-14 DIAGNOSIS — M4622 Osteomyelitis of vertebra, cervical region: Secondary | ICD-10-CM | POA: Diagnosis not present

## 2023-07-14 LAB — CBC WITH DIFFERENTIAL/PLATELET
Abs Immature Granulocytes: 0.01 10*3/uL (ref 0.00–0.07)
Basophils Absolute: 0 10*3/uL (ref 0.0–0.1)
Basophils Relative: 1 %
Eosinophils Absolute: 1.1 10*3/uL — ABNORMAL HIGH (ref 0.0–0.5)
Eosinophils Relative: 18 %
HCT: 34.9 % — ABNORMAL LOW (ref 39.0–52.0)
Hemoglobin: 11 g/dL — ABNORMAL LOW (ref 13.0–17.0)
Immature Granulocytes: 0 %
Lymphocytes Relative: 11 %
Lymphs Abs: 0.6 10*3/uL — ABNORMAL LOW (ref 0.7–4.0)
MCH: 26 pg (ref 26.0–34.0)
MCHC: 31.5 g/dL (ref 30.0–36.0)
MCV: 82.5 fL (ref 80.0–100.0)
Monocytes Absolute: 0.4 10*3/uL (ref 0.1–1.0)
Monocytes Relative: 7 %
Neutro Abs: 3.8 10*3/uL (ref 1.7–7.7)
Neutrophils Relative %: 63 %
Platelets: 313 10*3/uL (ref 150–400)
RBC: 4.23 MIL/uL (ref 4.22–5.81)
RDW: 16.5 % — ABNORMAL HIGH (ref 11.5–15.5)
WBC: 6 10*3/uL (ref 4.0–10.5)
nRBC: 0 % (ref 0.0–0.2)

## 2023-07-14 LAB — ABO/RH: ABO/RH(D): AB POS

## 2023-07-14 LAB — GLUCOSE, CAPILLARY
Glucose-Capillary: 56 mg/dL — ABNORMAL LOW (ref 70–99)
Glucose-Capillary: 61 mg/dL — ABNORMAL LOW (ref 70–99)
Glucose-Capillary: 84 mg/dL (ref 70–99)
Glucose-Capillary: 145 mg/dL — ABNORMAL HIGH (ref 70–99)
Glucose-Capillary: 102 mg/dL — ABNORMAL HIGH (ref 70–99)
Glucose-Capillary: 146 mg/dL — ABNORMAL HIGH (ref 70–99)

## 2023-07-14 MED ORDER — INSULIN GLARGINE-YFGN 100 UNIT/ML ~~LOC~~ SOLN
2.0000 [IU] | Freq: Every day | SUBCUTANEOUS | Status: DC
  Administered 2023-07-14 – 2023-07-17 (×3): 2 [IU] via SUBCUTANEOUS
  Filled 2023-07-14 (×5): qty 0.02

## 2023-07-14 MED ORDER — CHLORHEXIDINE GLUCONATE CLOTH 2 % EX PADS
6.0000 | MEDICATED_PAD | Freq: Once | CUTANEOUS | Status: AC
  Administered 2023-07-15: 6 via TOPICAL

## 2023-07-14 MED ORDER — CEFAZOLIN SODIUM-DEXTROSE 2-4 GM/100ML-% IV SOLN: 2.0000 g | INTRAVENOUS | Status: DC

## 2023-07-14 MED ORDER — CHLORHEXIDINE GLUCONATE CLOTH 2 % EX PADS
6.0000 | MEDICATED_PAD | Freq: Once | CUTANEOUS | Status: AC
  Administered 2023-07-14: 6 via TOPICAL

## 2023-07-14 MED ORDER — HYDROMORPHONE HCL 1 MG/ML IJ SOLN
0.2000 mg | Freq: Once | INTRAMUSCULAR | Status: AC
  Administered 2023-07-14: 0.2 mg via INTRAVENOUS
  Filled 2023-07-14: qty 1

## 2023-07-14 MED ORDER — CEFAZOLIN SODIUM-DEXTROSE 2-4 GM/100ML-% IV SOLN
2.0000 g | INTRAVENOUS | Status: AC
  Administered 2023-07-15: 2 g via INTRAVENOUS
  Filled 2023-07-14: qty 100

## 2023-07-14 MED ORDER — DEXTROSE IN LACTATED RINGERS 5 % IV SOLN: INTRAVENOUS | Status: AC

## 2023-07-14 MED ORDER — VANCOMYCIN HCL 750 MG IV SOLR
750.0000 mg | INTRAVENOUS | Status: DC
  Administered 2023-07-15 – 2023-07-17 (×2): 750 mg via INTRAVENOUS
  Filled 2023-07-14 (×5): qty 15

## 2023-07-14 NOTE — Progress Notes (Signed)
 Pharmacy Antibiotic Note  Shawn [name] is a 63 y.o. male admitted on 07/08/2023 with  osteomyelitis .  Pharmacy has been consulted for vancomycin , cefepime  dosing.  Cervical osteomyelitis - 07/09/23 Blood cx+ Staph epi.  ID following,  On IV vancomycin . TDC was exchanged 1/3, unable to remove d/t occluded SVC.  WBC wnl 6.0, afebrile. Weight 67 kg Repeat blood cultures 07/11/23 : no growth to date x 3 days  HD done 1/3, Vanc 750mg  given 1/3 post HD Next HD planned Mon 1/6 per  nephrology Neurosurgery plans for surgery on Mon. 1/6 for corpectomy/posterior stabilization    Plan: Continue Vancomycin  750mg  IV given after HD session -MWF -levels PRN per protocol -goal trough 15-20 mcg/mL (pre HD) F/u LOT, renal function/ HD schedule and cultures   Height: 5' 11 (180.3 cm) Weight: 67 kg (147 lb 11.3 oz) IBW/kg (Calculated) : 75.3  Temp (24hrs), Avg:98 F (36.7 C), Min:97.5 F (36.4 C), Max:98.4 F (36.9 C)  Recent Labs  Lab 07/09/23 1150 07/09/23 1920 07/11/23 0455 07/12/23 1303 07/13/23 0527 07/14/23 1224  WBC 6.0 5.6  --   --  6.3 6.0  CREATININE 6.24* 6.82* 5.14* 3.54* 4.83*  --     Estimated Creatinine Clearance: 15 mL/min (A) (by C-G formula based on SCr of 4.83 mg/dL (H)).    No Known Allergies  Antimicrobials this admission: Vanc 12/31>> Cefepime  12/31>>1/1 Cefazolin  x 1 on 1/3  (in IR) surg ppx  Dose adjustments this admission:  N/a  Microbiology results:  12/31 Bcx: GPC in clusters>>3/4 MRSE 1/2 Bcx x2: ngtd x 3d 1/3 MRSA PCR: negative ; SA pcr: negative 12/331 MRSA PCR: negative    Thank you for allowing pharmacy to be a part of this patient's care.  Sharyne Glatter, PharmD, BCCCP Clinical Pharmacist 07/14/2023 1:15 PM

## 2023-07-14 NOTE — Plan of Care (Signed)
  Problem: Education: Goal: Individualized Educational Video(s) Outcome: Progressing   Problem: Coping: Goal: Ability to adjust to condition or change in health will improve Outcome: Progressing   Problem: Fluid Volume: Goal: Ability to maintain a balanced intake and output will improve Outcome: Progressing   Problem: Health Behavior/Discharge Planning: Goal: Ability to identify and utilize available resources and services will improve Outcome: Progressing Goal: Ability to manage health-related needs will improve Outcome: Progressing   Problem: Metabolic: Goal: Ability to maintain appropriate glucose levels will improve Outcome: Progressing   Problem: Nutritional: Goal: Maintenance of adequate nutrition will improve Outcome: Progressing Goal: Progress toward achieving an optimal weight will improve Outcome: Progressing   Problem: Skin Integrity: Goal: Risk for impaired skin integrity will decrease Outcome: Progressing   Problem: Tissue Perfusion: Goal: Adequacy of tissue perfusion will improve Outcome: Progressing   Problem: Education: Goal: Knowledge of General Education information will improve Description: Including pain rating scale, medication(s)/side effects and non-pharmacologic comfort measures Outcome: Progressing   Problem: Health Behavior/Discharge Planning: Goal: Ability to manage health-related needs will improve Outcome: Progressing   Problem: Clinical Measurements: Goal: Ability to maintain clinical measurements within normal limits will improve Outcome: Progressing Goal: Will remain free from infection Outcome: Progressing Goal: Diagnostic test results will improve Outcome: Progressing Goal: Respiratory complications will improve Outcome: Progressing Goal: Cardiovascular complication will be avoided Outcome: Progressing   Problem: Activity: Goal: Risk for activity intolerance will decrease Outcome: Progressing   Problem: Nutrition: Goal:  Adequate nutrition will be maintained Outcome: Progressing   Problem: Coping: Goal: Level of anxiety will decrease Outcome: Progressing   Problem: Elimination: Goal: Will not experience complications related to bowel motility Outcome: Progressing Goal: Will not experience complications related to urinary retention Outcome: Progressing   Problem: Pain Management: Goal: General experience of comfort will improve Outcome: Progressing   Problem: Safety: Goal: Ability to remain free from injury will improve Outcome: Progressing   Problem: Skin Integrity: Goal: Risk for impaired skin integrity will decrease Outcome: Progressing

## 2023-07-14 NOTE — Progress Notes (Signed)
 HOSPITALIST                ROUNDING                 NOTE Shawn [name] FMW:[id number]  DOB: 09-15-1960  DOA: 07/08/2023  PCP: Hillman Bare, MD  07/14/2023,1:45 PM   LOS: 5 days      Code Status:  full code  From: Home  current Dispo: Unclear     63 year old black male ESRD MWF since 2011 right chest TDC-complicated by orthostatic hypotension/hypotension and bradycardic arrest in 2020 HTN DM TY 2 HLD Bilateral blindness Previous right chylothorax in 2020  Brought to emergency room 12/31 with 2-week neck pain radiating down upper back without trauma fever chills other signs of infection some tingling right hand--sodium 3.2 BUN/creatinine 20/6.2 WBC 6.0 heme 363 CXR 1 view small bilateral right greater than left pleural effusions streaky opacities right lung ESR 63, CRP 7.8 MRI = vertebral discitis osteomyelitis C6/C7 -- vertebral body height loss and retropulsion with severe canal stenosis and compression of spinal cord--  Neurosurgeon Dr. 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 Louis Echo as above  Pain controlled overall on Tylenol  650 mild pain, Dilaudid  0.5 every 6 for severe  ESRD MWF through right TDC line Very restricted dialysis access--- seen note 07/10/2023--cannot remove R TDC, only exchange continue Fosrenal 1 g 3 times daily,  Rocaltrol  0.5 MWF   DM TY 2 A1c 5.8-Tresiba  usually 12 at bedtime Hypoglycemic episode 1/4 -eating ~ 20 % meals Cut back Semglee  10-->2-continues on 3 times daily sliding scale for now Start d5/lr midnight 40cc/h as will be npo  Hypertension with history of orthostatic hypotension secondary to beta-blocker Continues on amlodipine  5, Zestril  20 twice daily-blood pressures are stable  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 retinopathy Continue all eyedrops  History of CVA previously  DVT prophylaxis: Heparin   Status is: Inpatient Remains inpatient appropriate because:      Subjective:  Cannot eat because R arm pains when sitting up No n/v Sugar dropped this am.  No fever no chills  Objective + exam Vitals:   07/13/23 1642 07/13/23 2031 07/14/23 0456 07/14/23 0848  BP: 138/62 (!) 150/66 138/70 (!) 160/73  Pulse: 76 77 70 74  Resp: 18  18 18   Temp: 98.4 F (36.9 C) 97.8 F (36.6 C) 98.2 F (36.8 C) (!) 97.5 F (36.4 C)  TempSrc: Oral Oral  Oral  SpO2: 100% 99% 100% 100%  Weight:      Height:       Filed Weights   07/10/23 0810 07/10/23 1214 07/12/23 0728  Weight: 67.5 kg 65.5 kg 67 kg    Examination:  EOMI NCAT no focal deficit no icterus no pallor-poor visual acuity Chest is clear no wheeze rales rhonchi ROM is intact moving 4 limbs equally although right is a little bit weaker with extension of right arm Abd soft  Data Reviewed: reviewed   CBC    Component  Value Date/Time   WBC 6.0 07/14/2023 1224   RBC 4.23 07/14/2023 1224   HGB 11.0 (L) 07/14/2023 1224   HCT 34.9 (L) 07/14/2023 1224   PLT 313 07/14/2023 1224   MCV 82.5 07/14/2023 1224   MCH 26.0 07/14/2023 1224   MCHC 31.5 07/14/2023 1224   RDW 16.5 (H) 07/14/2023 1224   LYMPHSABS 0.6 (L) 07/14/2023 1224   MONOABS 0.4 07/14/2023 1224   EOSABS 1.1 (H) 07/14/2023 1224   BASOSABS 0.0 07/14/2023 1224      Latest Ref Rng & Units 07/13/2023    5:27 AM 07/12/2023    1:03 PM 07/11/2023    4:55 AM   CMP  Glucose 70 - 99 mg/dL 793  80  880   BUN 8 - 23 mg/dL 18  12  15    Creatinine 0.61 - 1.24 mg/dL 5.16  6.45  4.85   Sodium 135 - 145 mmol/L 133  132  132   Potassium 3.5 - 5.1 mmol/L 3.8  3.4  3.7   Chloride 98 - 111 mmol/L 96  94  95   CO2 22 - 32 mmol/L 25  24  23    Calcium  8.9 - 10.3 mg/dL 8.5  9.0  8.7     Scheduled Meds:  amLODipine   5 mg Oral Daily   aspirin  EC  325 mg Oral QHS   atorvastatin   80 mg Oral Daily   brimonidine   1 drop Both Eyes BID   calcitRIOL   0.5 mcg Oral Q M,W,F   Chlorhexidine  Gluconate Cloth  6 each Topical Q0600   dorzolamide -timolol   1 drop Both Eyes BID   heparin   5,000 Units Subcutaneous Q8H   insulin  aspart  0-9 Units Subcutaneous TID WC   insulin  glargine-yfgn  2 Units Subcutaneous QHS   lanthanum   1,000 mg Oral TID WC   latanoprost   1 drop Both Eyes QHS   lisinopril   20 mg Oral BID   Continuous Infusions:  [START ON 07/15/2023] dextrose  5% lactated ringers      [START ON 07/15/2023] vancomycin  (VANCOCIN ) 750 mg in sodium chloride  0.9 % 250 mL IVPB      Time  15  Colen Grimes, MD  [facility]

## 2023-07-14 NOTE — Progress Notes (Signed)
 Patient had 3 pill bottles that needed to be taken down to pharmacy for storage.  Medication taken to pharmacy as per protocol and pharmacy record sheet in patient's hard copy chart.  Patient to retrieve medications at discharge.  Bernie Covey RN

## 2023-07-14 NOTE — Progress Notes (Signed)
 PT Cancellation Note  Patient Details Name: Shawn [name] MRN: [id number] DOB: 23-Apr-1961   Cancelled Treatment:    Reason Eval/Treat Not Completed: Pain limiting ability to participate. Pt for surgery tomorrow and asked to defer PT until after surgery which is reasonable. Will follow up post op.    Rodgers ORN [facility] 07/14/2023, 1:15 PM Rodgers Opal PT Acute [employer] [phone]

## 2023-07-14 NOTE — Progress Notes (Signed)
 [name] Progress Note   Subjective:  Seen in room. No new events. Continued neck pain/discomfort. For C- spine surgery Monday   Objective Vitals:   07/13/23 1642 07/13/23 2031 07/14/23 0456 07/14/23 0848  BP: 138/62 (!) 150/66 138/70 (!) 160/73  Pulse: 76 77 70 74  Resp: 18  18 18   Temp: 98.4 F (36.9 C) 97.8 F (36.6 C) 98.2 F (36.8 C) (!) 97.5 F (36.4 C)  TempSrc: Oral Oral  Oral  SpO2: 100% 99% 100% 100%  Weight:      Height:       Physical Exam General: Lying in bed, in C-collar, Heart: RRR, no murmurs, rubs or gallops Lungs: CTA bilaterally, respirations unlabored on RA Abdomen: Soft, non-distended  Extremities: No edema b/l lower extremities  Dialysis Access: R internal jugular TDC in place   Additional Objective Labs: Basic Metabolic Panel: Recent Labs  Lab 07/09/23 1920 07/11/23 0455 07/12/23 1303 07/13/23 0527  NA 135 132* 132* 133*  K 3.3* 3.7 3.4* 3.8  CL 97* 95* 94* 96*  CO2 24 23 24 25   GLUCOSE 97 119* 80 206*  BUN 23 15 12 18   CREATININE 6.82* 5.14* 3.54* 4.83*  CALCIUM  8.6* 8.7* 9.0 8.5*  PHOS 3.9  --   --   --    Liver Function Tests: Recent Labs  Lab 07/09/23 1920  ALBUMIN  2.7*   No results for input(s): LIPASE, AMYLASE in the last 168 hours. CBC: Recent Labs  Lab 07/09/23 1150 07/09/23 1920 07/13/23 0527  WBC 6.0 5.6 6.3  NEUTROABS  --   --  4.1  HGB 11.5* 9.7* 9.9*  HCT 36.9* 30.9* 31.5*  MCV 84.4 84.4 84.0  PLT 363 340 313   Blood Culture    Component Value Date/Time   SDES BLOOD RIGHT ARM 07/11/2023 1549   SPECREQUEST  07/11/2023 1549    BOTTLES DRAWN AEROBIC ONLY Blood Culture results may not be optimal due to an inadequate volume of blood received in culture bottles   CULT  07/11/2023 1549    NO GROWTH 3 DAYS Performed at [facility], [address], [place], [place] [address]    REPTSTATUS PENDING 07/11/2023 1549    Cardiac Enzymes: No results for input(s): CKTOTAL, CKMB,  CKMBINDEX, TROPONINI in the last 168 hours. CBG: Recent Labs  Lab 07/13/23 1734 07/13/23 2030 07/14/23 0715 07/14/23 0844 07/14/23 0923  GLUCAP 128* 109* 56* 61* 84   Iron Studies: No results for input(s): IRON, TIBC, TRANSFERRIN, FERRITIN in the last 72 hours. @lablastinr3 @ Studies/Results: IR Fluoro Guide CV Line Right Result Date: 07/13/2023 INDICATION: 63 year old male with a history of renal failure, presents with bacteremia/infection and request for a line holiday. Cervical MR 07/09/2023 demonstrates discitis/osteomyelitis C6-C7 and associated severe spinal stenosis. Upon review of prior images, it appears that the patient has limited upper extremity venous access, with evidence of occluded internal jugular veins bilaterally. Prior chest CT 12/19/2022 demonstrates an occlusive fibrin sheath of the right brachiocephalic vein above the SVC (image 46/122 series 7). Plan is for exchange for a new catheter at the right IJ site and treatment through the infection. EXAM: IMAGE GUIDED EXCHANGE OF RIGHT INTERNAL JUGULAR VEIN TUNNELED HEMODIALYSIS CATHETER BALLOON ANGIOPLASTY OCCLUSIVE FIBRIN SHEATH LIMITED VENOGRAM MEDICATIONS: 2 g Ancef . The antibiotic was given in an appropriate time interval prior to skin puncture. ANESTHESIA/SEDATION: Moderate (conscious) sedation was not employed during this procedure. FLUOROSCOPY: Radiation Exposure Index (as provided by the fluoroscopic device): 17 mGy Kerma COMPLICATIONS: None PROCEDURE:  Informed written consent was obtained from the patient after a discussion of the risks, benefits, and alternatives to treatment. Questions regarding the procedure were encouraged and answered. The right neck and chest including indwelling catheter were prepped with chlorhexidine  in a sterile fashion, and a sterile drape was applied covering the operative field. Maximum barrier sterile technique with sterile gowns and gloves were used for the procedure. A timeout was  performed prior to the initiation of the procedure. Upon initial inspection the subcutaneous tunnel is quite short, at the level of the clavicle just above the IJ puncture site. Initial images stored. 1% lidocaine  was used for local anesthesia. Both the red and blue ports were aspirated clearing the heparin . Aspirated easily. A stiff Glidewire was advanced to the IVC. A pull-back wire technique was used to estimate the internal length, which was a proximally 24 cm. We elected to place a 23 cm tip to cuff catheter. Glidewire was then again advanced to the IVC. Blunt dissection was used to free the cuff from the soft tissue tract. The catheter was withdrawn with some significant tension, apparently from the occlusive fibrin sheath of the brachiocephalic vein. Once the catheter was removed, the new 23 cm tip to cuff catheter was advanced on the wire. The catheter would not advanced through the occlusive fibrin sheath. Balloon angioplasty was required. A 9 French sheath was advanced on the Glidewire. A pull-back venogram was performed, both under fluoroscopic observation and also with DSA imaging. This demonstrates the occlusive fibrin sheath at the brachiocephalic vein. 10 mm x 40 mm balloon was then advanced into the fibrin sheath. The balloon was inflated to profile, with maximum atmospheric pressure of 12 atmospheres achieved. There was a small waist persisting in the SVC fibrin sheath, approximately 25-30% diameter reduction based on the profile of the balloon. Balloon was then removed. After the balloon angioplasty the new catheter advanced easily through the brachiocephalic vein. Final catheter positioning was confirmed and documented with a spot radiographic image. The catheter aspirates and flushes normally. The catheter was flushed with appropriate volume heparin  dwells. The catheter exit site was secured with a 0-Prolene retention suture. Dressings were applied. The patient tolerated the procedure well  without immediate post procedural complication. IMPRESSION: Status post image guided exchange of right IJ tunneled hemodialysis catheter, requiring limited venogram and balloon angioplasty of occlusive fibrin sheath at the right brachiocephalic vein as above. Would advise careful consideration of any removal of this catheter without a planned angioplasty or other plan. Potentially a new right external jugular vein access site would be possible using a buddy wire/safety wire technique through the occlusive fibrin sheath. Signed, Ami RAMAN. Alona ROSALEA GRAVER, RPVI [facility] Radiology Electronically Signed   By: Ami Alona D.O.   On: 07/13/2023 07:38   IR PTA VENOUS EXCEPT DIALYSIS CIRCUIT Result Date: 07/13/2023 INDICATION: 63 year old male with a history of renal failure, presents with bacteremia/infection and request for a line holiday. Cervical MR 07/09/2023 demonstrates discitis/osteomyelitis C6-C7 and associated severe spinal stenosis. Upon review of prior images, it appears that the patient has limited upper extremity venous access, with evidence of occluded internal jugular veins bilaterally. Prior chest CT 12/19/2022 demonstrates an occlusive fibrin sheath of the right brachiocephalic vein above the SVC (image 46/122 series 7). Plan is for exchange for a new catheter at the right IJ site and treatment through the infection. EXAM: IMAGE GUIDED EXCHANGE OF RIGHT INTERNAL JUGULAR VEIN TUNNELED HEMODIALYSIS CATHETER BALLOON ANGIOPLASTY OCCLUSIVE FIBRIN SHEATH LIMITED  VENOGRAM MEDICATIONS: 2 g Ancef . The antibiotic was given in an appropriate time interval prior to skin puncture. ANESTHESIA/SEDATION: Moderate (conscious) sedation was not employed during this procedure. FLUOROSCOPY: Radiation Exposure Index (as provided by the fluoroscopic device): 17 mGy Kerma COMPLICATIONS: None PROCEDURE: Informed written consent was obtained from the patient after a discussion  of the risks, benefits, and alternatives to treatment. Questions regarding the procedure were encouraged and answered. The right neck and chest including indwelling catheter were prepped with chlorhexidine  in a sterile fashion, and a sterile drape was applied covering the operative field. Maximum barrier sterile technique with sterile gowns and gloves were used for the procedure. A timeout was performed prior to the initiation of the procedure. Upon initial inspection the subcutaneous tunnel is quite short, at the level of the clavicle just above the IJ puncture site. Initial images stored. 1% lidocaine  was used for local anesthesia. Both the red and blue ports were aspirated clearing the heparin . Aspirated easily. A stiff Glidewire was advanced to the IVC. A pull-back wire technique was used to estimate the internal length, which was a proximally 24 cm. We elected to place a 23 cm tip to cuff catheter. Glidewire was then again advanced to the IVC. Blunt dissection was used to free the cuff from the soft tissue tract. The catheter was withdrawn with some significant tension, apparently from the occlusive fibrin sheath of the brachiocephalic vein. Once the catheter was removed, the new 23 cm tip to cuff catheter was advanced on the wire. The catheter would not advanced through the occlusive fibrin sheath. Balloon angioplasty was required. A 9 French sheath was advanced on the Glidewire. A pull-back venogram was performed, both under fluoroscopic observation and also with DSA imaging. This demonstrates the occlusive fibrin sheath at the brachiocephalic vein. 10 mm x 40 mm balloon was then advanced into the fibrin sheath. The balloon was inflated to profile, with maximum atmospheric pressure of 12 atmospheres achieved. There was a small waist persisting in the SVC fibrin sheath, approximately 25-30% diameter reduction based on the profile of the balloon. Balloon was then removed. After the balloon angioplasty the new  catheter advanced easily through the brachiocephalic vein. Final catheter positioning was confirmed and documented with a spot radiographic image. The catheter aspirates and flushes normally. The catheter was flushed with appropriate volume heparin  dwells. The catheter exit site was secured with a 0-Prolene retention suture. Dressings were applied. The patient tolerated the procedure well without immediate post procedural complication. IMPRESSION: Status post image guided exchange of right IJ tunneled hemodialysis catheter, requiring limited venogram and balloon angioplasty of occlusive fibrin sheath at the right brachiocephalic vein as above. Would advise careful consideration of any removal of this catheter without a planned angioplasty or other plan. Potentially a new right external jugular vein access site would be possible using a buddy wire/safety wire technique through the occlusive fibrin sheath. Signed, Ami RAMAN. Alona ROSALEA GRAVER, RPVI [facility] Radiology Electronically Signed   By: Ami Alona D.O.   On: 07/13/2023 07:38   ECHOCARDIOGRAM COMPLETE Result Date: 07/12/2023    ECHOCARDIOGRAM REPORT   Patient Name:   Shawn [name] Date of Exam: 07/12/2023 Medical Rec #:  [id number]            Height:       71.0 in Accession #:    [id number]           Weight:       147.7 lb Date of Birth:  1960-09-13            BSA:          1.854 m Patient Age:    62 years             BP:           169/78 mmHg Patient Gender: M                    HR:           90 bpm. Exam Location:  Inpatient Procedure: 2D Echo, 3D Echo, Cardiac Doppler, Color Doppler, Strain Analysis and            Intracardiac Opacification Agent Indications:    Bacteremia  History:        Patient has no prior history of Echocardiogram examinations.                 Risk Factors:Diabetes and Dyslipidemia.  Sonographer:    Ozell Free Referring Phys: [id number] CORNELIUS N VAN DAM  Sonographer Comments: No  subcostal window. Image acquisition challenging due to patient body habitus. Global longitudinal strain was attempted. IMPRESSIONS  1. Left ventricular ejection fraction, by estimation, is 25 to 30%. The left ventricle has severely decreased function. The left ventricle demonstrates global hypokinesis. Indeterminate diastolic filling due to E-A fusion.  2. Right ventricular systolic function is mildly reduced. The right ventricular size is normal. There is severely elevated pulmonary artery systolic pressure.  3. Left atrial size was mildly dilated.  4. The mitral valve is normal in structure. No evidence of mitral valve regurgitation. No evidence of mitral stenosis.  5. The aortic valve is tricuspid. There is moderate calcification of the aortic valve. There is mild thickening of the aortic valve. Aortic valve regurgitation is not visualized. Aortic valve sclerosis/calcification is present, without any evidence of aortic stenosis. Conclusion(s)/Recommendation(s): No evidence of valvular vegetations on this transthoracic echocardiogram. Consider a transesophageal echocardiogram to exclude infective endocarditis if clinically indicated. FINDINGS  Left Ventricle: Left ventricular ejection fraction, by estimation, is 25 to 30%. The left ventricle has severely decreased function. The left ventricle demonstrates global hypokinesis. Definity  contrast agent was given IV to delineate the left ventricular endocardial borders. The left ventricular internal cavity size was normal in size. There is no left ventricular hypertrophy. Indeterminate diastolic filling due to E-A fusion. Right Ventricle: The right ventricular size is normal. No increase in right ventricular wall thickness. Right ventricular systolic function is mildly reduced. There is severely elevated pulmonary artery systolic pressure. The tricuspid regurgitant velocity is 3.85 m/s, and with an assumed right atrial pressure of 3 mmHg, the estimated right  ventricular systolic pressure is 62.3 mmHg. Left Atrium: Left atrial size was mildly dilated. Right Atrium: Right atrial size was normal in size. Pericardium: There is no evidence of pericardial effusion. Mitral Valve: The mitral valve is normal in structure. There is mild calcification of the mitral valve leaflet(s). No evidence of mitral valve regurgitation. No evidence of mitral valve stenosis. Tricuspid Valve: The tricuspid valve is normal in structure. Tricuspid valve regurgitation is not demonstrated. No evidence of tricuspid stenosis. Aortic Valve: The aortic valve is tricuspid. There is moderate calcification of the aortic valve. There is mild thickening of the aortic valve. There is moderate aortic valve annular calcification. Aortic valve regurgitation is not visualized. Aortic valve sclerosis/calcification is present, without any evidence of aortic stenosis. Aortic valve mean gradient measures 5.0 mmHg. Aortic valve peak gradient measures 8.8 mmHg. Aortic valve  area, by VTI measures 1.66 cm. Pulmonic Valve: The pulmonic valve was not well visualized. Pulmonic valve regurgitation is trivial. No evidence of pulmonic stenosis. Aorta: The aortic root is normal in size and structure. Venous: The inferior vena cava was not well visualized. IAS/Shunts: No atrial level shunt detected by color flow Doppler.  LEFT VENTRICLE PLAX 2D LVIDd:         5.60 cm      Diastology LVIDs:         4.50 cm      LV e' medial:    4.03 cm/s LV PW:         0.90 cm      LV E/e' medial:  18.5 LV IVS:        0.80 cm      LV e' lateral:   3.59 cm/s LVOT diam:     2.00 cm      LV E/e' lateral: 20.8 LV SV:         49 LV SV Index:   26 LVOT Area:     3.14 cm                              3D Volume EF: LV Volumes (MOD)            3D EF:        28 % LV vol d, MOD A2C: 133.0 ml LV EDV:       205 ml LV vol d, MOD A4C: 111.0 ml LV ESV:       148 ml LV vol s, MOD A2C: 90.7 ml  LV SV:        57 ml LV vol s, MOD A4C: 70.2 ml LV SV MOD A2C:      42.3 ml LV SV MOD A4C:     111.0 ml LV SV MOD BP:      37.0 ml RIGHT VENTRICLE RV Basal diam:  3.70 cm RV S prime:     11.60 cm/s TAPSE (M-mode): 2.0 cm LEFT ATRIUM             Index        RIGHT ATRIUM           Index LA diam:        3.50 cm 1.89 cm/m   RA Area:     12.30 cm LA Vol (A2C):   64.9 ml 35.01 ml/m  RA Volume:   33.00 ml  17.80 ml/m LA Vol (A4C):   41.2 ml 22.23 ml/m LA Biplane Vol: 56.1 ml 30.26 ml/m  AORTIC VALVE AV Area (Vmax):    1.80 cm AV Area (Vmean):   1.56 cm AV Area (VTI):     1.66 cm AV Vmax:           148.00 cm/s AV Vmean:          104.000 cm/s AV VTI:            0.293 m AV Peak Grad:      8.8 mmHg AV Mean Grad:      5.0 mmHg LVOT Vmax:         84.90 cm/s LVOT Vmean:        51.500 cm/s LVOT VTI:          0.155 m LVOT/AV VTI ratio: 0.53  AORTA Ao Root diam: 2.80 cm Ao Asc diam:  2.40 cm MITRAL VALVE  TRICUSPID VALVE MV Area (PHT): 9.73 cm     TR Peak grad:   59.3 mmHg MV Decel Time: 78 msec      TR Vmax:        385.00 cm/s MV E velocity: 74.60 cm/s MV A velocity: 119.00 cm/s  SHUNTS MV E/A ratio:  0.63         Systemic VTI:  0.16 m                             Systemic Diam: 2.00 cm Toribio Fuel MD Electronically signed by Toribio Fuel MD Signature Date/Time: 07/12/2023/3:35:11 PM    Final    Medications:  [START ON 07/15/2023] vancomycin  (VANCOCIN ) 750 mg in sodium chloride  0.9 % 250 mL IVPB      amLODipine   5 mg Oral Daily   aspirin  EC  325 mg Oral QHS   atorvastatin   80 mg Oral Daily   brimonidine   1 drop Both Eyes BID   calcitRIOL   0.5 mcg Oral Q M,W,F   Chlorhexidine  Gluconate Cloth  6 each Topical Q0600   dorzolamide -timolol   1 drop Both Eyes BID   heparin   5,000 Units Subcutaneous Q8H   insulin  aspart  0-9 Units Subcutaneous TID WC   insulin  glargine-yfgn  10 Units Subcutaneous QHS   lanthanum   1,000 mg Oral TID WC   latanoprost   1 drop Both Eyes QHS   lisinopril   20 mg Oral BID    Dialysis Orders: Center: [facility]  on MWF. 180Nre  3hr 30 min BFR 400 DFR Auto 1.5 EDW 69.9kg 2K 2.5Ca TDC Heparin  6500 unit bolus and 2900 unit bolus mid HD Mircera 120mcg IV q 2 weeks- last dose 06/30/23 Calcitriol  0.5mcg PO q HD    Assessment/Plan: Cervical discitis/osteomyelitis. Blood cx+ Staph epi.  ID following  On IV vancomycin . Has TDC - line holiday requested. IR unable to remove TDC. Limited access options if removed. See IR notes 07/12/23. TDC was exchanged 07/12/23.  ID guiding antibiotics. Repeat cultures pending.    Cervical spine stenosis/compression - Severe. Neurosurgery following. For surgery Monday.  ESRD:  On dialysis MWF. Next HD Mon. Work around surgery.  Hypertension/volume: BP slightly elevated, euvolemic on exam. Below prior EDW, will need to be lowered at discharge. Continue home BP meds  Anemia: Hgb 9.7. ESA recently dosed. Holding IV Fe in setting of acute infection.   Metabolic bone disease: Calcium  controlled. Follow Phos here. Continue calcitriol  and binders   T2DM: With retinopathy. Management per primary team   Maisie Ronnald Acosta PA-C [facility] 07/14/2023,10:33 AM

## 2023-07-14 NOTE — Progress Notes (Signed)
  NEUROSURGERY PROGRESS NOTE   No issues overnight. Unchanged neck and right arm pain.  EXAM:  BP (!) 160/73 (BP Location: Right Arm)   Pulse 74   Temp (!) 97.5 F (36.4 C) (Oral)   Resp 18   Ht 5' 11 (1.803 m)   Wt 67 kg   SpO2 100%   BMI 20.60 kg/m   Awake, alert, oriented  Speech fluent, appropriate  CN grossly intact  5/5 BUE/BLE x right tricep weakness 3/5   IMPRESSION:  63 y.o. male with destructive lesion C6-7 with associated deformity and spinal cord compression requiring circumferential deocmpression/stabilization  PLAN: - Cont Cervical collar immobilization at all times - Plan for surgery tomorrow with Dr. Louis for corpectomy/posterior stabilization - NPO p MN tonight   Gerldine Maizes, MD [facility]

## 2023-07-15 ENCOUNTER — Other Ambulatory Visit: Payer: Self-pay

## 2023-07-15 ENCOUNTER — Encounter (HOSPITAL_COMMUNITY)
Admission: EM | Disposition: A | Discharge status: Rehabilitation Facility | Payer: Self-pay | Source: Home / Self Care | Attending: Family Medicine

## 2023-07-15 ENCOUNTER — Inpatient Hospital Stay (HOSPITAL_COMMUNITY): Payer: Medicare Other | Admitting: Certified Registered"

## 2023-07-15 ENCOUNTER — Encounter (HOSPITAL_COMMUNITY): Payer: Self-pay | Admitting: Student

## 2023-07-15 ENCOUNTER — Inpatient Hospital Stay (HOSPITAL_COMMUNITY): Payer: Medicare Other

## 2023-07-15 DIAGNOSIS — M4622 Osteomyelitis of vertebra, cervical region: Secondary | ICD-10-CM

## 2023-07-15 DIAGNOSIS — I120 Hypertensive chronic kidney disease with stage 5 chronic kidney disease or end stage renal disease: Secondary | ICD-10-CM

## 2023-07-15 DIAGNOSIS — N186 End stage renal disease: Secondary | ICD-10-CM

## 2023-07-15 DIAGNOSIS — Z992 Dependence on renal dialysis: Secondary | ICD-10-CM

## 2023-07-15 DIAGNOSIS — E1122 Type 2 diabetes mellitus with diabetic chronic kidney disease: Secondary | ICD-10-CM

## 2023-07-15 HISTORY — PX: POSTERIOR CERVICAL FUSION/FORAMINOTOMY: SHX5038

## 2023-07-15 HISTORY — PX: ANTERIOR CERVICAL CORPECTOMY: SHX1159

## 2023-07-15 LAB — POCT I-STAT, CHEM 8
BUN: 15 mg/dL (ref 8–23)
Calcium, Ion: 1.1 mmol/L — ABNORMAL LOW (ref 1.15–1.40)
Chloride: 97 mmol/L — ABNORMAL LOW (ref 98–111)
Creatinine, Ser: 4.7 mg/dL — ABNORMAL HIGH (ref 0.61–1.24)
Glucose, Bld: 110 mg/dL — ABNORMAL HIGH (ref 70–99)
HCT: 41 % (ref 39.0–52.0)
Hemoglobin: 13.9 g/dL (ref 13.0–17.0)
Potassium: 3.7 mmol/L (ref 3.5–5.1)
Sodium: 133 mmol/L — ABNORMAL LOW (ref 135–145)
TCO2: 24 mmol/L (ref 22–32)

## 2023-07-15 LAB — CBC WITH DIFFERENTIAL/PLATELET
Abs Immature Granulocytes: 0.02 10*3/uL (ref 0.00–0.07)
Basophils Absolute: 0.1 10*3/uL (ref 0.0–0.1)
Basophils Relative: 1 %
Eosinophils Absolute: 1.2 10*3/uL — ABNORMAL HIGH (ref 0.0–0.5)
Eosinophils Relative: 17 %
HCT: 37 % — ABNORMAL LOW (ref 39.0–52.0)
Hemoglobin: 11.6 g/dL — ABNORMAL LOW (ref 13.0–17.0)
Immature Granulocytes: 0 %
Lymphocytes Relative: 14 %
Lymphs Abs: 0.9 10*3/uL (ref 0.7–4.0)
MCH: 26.1 pg (ref 26.0–34.0)
MCHC: 31.4 g/dL (ref 30.0–36.0)
MCV: 83.3 fL (ref 80.0–100.0)
Monocytes Absolute: 0.5 10*3/uL (ref 0.1–1.0)
Monocytes Relative: 8 %
Neutro Abs: 4.2 10*3/uL (ref 1.7–7.7)
Neutrophils Relative %: 60 %
Platelets: 142 10*3/uL — ABNORMAL LOW (ref 150–400)
RBC: 4.44 MIL/uL (ref 4.22–5.81)
RDW: 16.4 % — ABNORMAL HIGH (ref 11.5–15.5)
WBC: 7 10*3/uL (ref 4.0–10.5)
nRBC: 0 % (ref 0.0–0.2)

## 2023-07-15 LAB — RENAL FUNCTION PANEL
Albumin: 2.9 g/dL — ABNORMAL LOW (ref 3.5–5.0)
Anion gap: 14 (ref 5–15)
BUN: 31 mg/dL — ABNORMAL HIGH (ref 8–23)
CO2: 23 mmol/L (ref 22–32)
Calcium: 8.9 mg/dL (ref 8.9–10.3)
Chloride: 90 mmol/L — ABNORMAL LOW (ref 98–111)
Creatinine, Ser: 7.23 mg/dL — ABNORMAL HIGH (ref 0.61–1.24)
GFR, Estimated: 8 mL/min — ABNORMAL LOW (ref >60)
Glucose, Bld: 121 mg/dL — ABNORMAL HIGH (ref 70–99)
Phosphorus: 3.2 mg/dL (ref 2.5–4.6)
Potassium: 3.8 mmol/L (ref 3.5–5.1)
Sodium: 127 mmol/L — ABNORMAL LOW (ref 135–145)

## 2023-07-15 LAB — PREPARE RBC (CROSSMATCH)

## 2023-07-15 LAB — GLUCOSE, CAPILLARY
Glucose-Capillary: 126 mg/dL — ABNORMAL HIGH (ref 70–99)
Glucose-Capillary: 98 mg/dL (ref 70–99)

## 2023-07-15 SURGERY — ANTERIOR CERVICAL CORPECTOMY
Anesthesia: General | Site: Neck

## 2023-07-15 MED ORDER — DEXAMETHASONE SODIUM PHOSPHATE 10 MG/ML IJ SOLN
INTRAMUSCULAR | Status: AC
  Filled 2023-07-15: qty 1

## 2023-07-15 MED ORDER — PROPOFOL 10 MG/ML IV BOLUS
INTRAVENOUS | Status: AC
Start: 2023-07-15 — End: ?
  Filled 2023-07-15: qty 20

## 2023-07-15 MED ORDER — THROMBIN 20000 UNITS EX SOLR: CUTANEOUS | Status: DC | PRN

## 2023-07-15 MED ORDER — BUPIVACAINE HCL (PF) 0.25 % IJ SOLN
INTRAMUSCULAR | Status: AC
Start: 2023-07-15 — End: ?
  Filled 2023-07-15: qty 30

## 2023-07-15 MED ORDER — THROMBIN 5000 UNITS EX SOLR: OROMUCOSAL | Status: DC | PRN

## 2023-07-15 MED ORDER — TRANEXAMIC ACID-NACL 1000-0.7 MG/100ML-% IV SOLN
INTRAVENOUS | Status: AC
  Filled 2023-07-15: qty 100

## 2023-07-15 MED ORDER — THROMBIN 5000 UNITS EX SOLR
CUTANEOUS | Status: AC
  Filled 2023-07-15: qty 5000

## 2023-07-15 MED ORDER — INSULIN ASPART 100 UNIT/ML IJ SOLN: 0.0000 [IU] | INTRAMUSCULAR | Status: DC | PRN

## 2023-07-15 MED ORDER — SODIUM CHLORIDE 0.9 % IV SOLN: INTRAVENOUS | Status: DC

## 2023-07-15 MED ORDER — CHLORHEXIDINE GLUCONATE 0.12 % MT SOLN
15.0000 mL | Freq: Once | OROMUCOSAL | Status: AC
Start: 2023-07-15 — End: 2023-07-15
  Administered 2023-07-15: 15 mL via OROMUCOSAL

## 2023-07-15 MED ORDER — LIDOCAINE 2% (20 MG/ML) 5 ML SYRINGE
INTRAMUSCULAR | Status: DC | PRN
  Administered 2023-07-15: 40 mg via INTRAVENOUS

## 2023-07-15 MED ORDER — 0.9 % SODIUM CHLORIDE (POUR BTL) OPTIME
TOPICAL | Status: DC | PRN
  Administered 2023-07-15: 1000 mL

## 2023-07-15 MED ORDER — EPHEDRINE 5 MG/ML INJ
INTRAVENOUS | Status: AC
  Filled 2023-07-15: qty 5

## 2023-07-15 MED ORDER — FENTANYL CITRATE (PF) 250 MCG/5ML IJ SOLN
INTRAMUSCULAR | Status: DC | PRN
  Administered 2023-07-15: 50 ug via INTRAVENOUS
  Administered 2023-07-15: 100 ug via INTRAVENOUS
  Administered 2023-07-15 (×2): 50 ug via INTRAVENOUS

## 2023-07-15 MED ORDER — SODIUM CHLORIDE 0.9 % IV SOLN
INTRAVENOUS | Status: DC | PRN
Start: 2023-07-15 — End: 2023-07-15

## 2023-07-15 MED ORDER — ROCURONIUM BROMIDE 10 MG/ML (PF) SYRINGE
PREFILLED_SYRINGE | INTRAVENOUS | Status: DC | PRN
  Administered 2023-07-15: 10 mg via INTRAVENOUS
  Administered 2023-07-15: 20 mg via INTRAVENOUS
  Administered 2023-07-15: 10 mg via INTRAVENOUS
  Administered 2023-07-15: 40 mg via INTRAVENOUS
  Administered 2023-07-15: 20 mg via INTRAVENOUS

## 2023-07-15 MED ORDER — PROMETHAZINE HCL 25 MG/ML IJ SOLN
12.5000 mg | Freq: Once | INTRAMUSCULAR | Status: DC | PRN
Start: 2023-07-15 — End: 2023-07-16

## 2023-07-15 MED ORDER — ORAL CARE MOUTH RINSE: 15.0000 mL | Freq: Once | OROMUCOSAL | Status: AC

## 2023-07-15 MED ORDER — PHENYLEPHRINE HCL-NACL 20-0.9 MG/250ML-% IV SOLN
INTRAVENOUS | Status: DC | PRN
  Administered 2023-07-15: 20 ug/min via INTRAVENOUS

## 2023-07-15 MED ORDER — FENTANYL CITRATE (PF) 100 MCG/2ML IJ SOLN: 25.0000 ug | INTRAMUSCULAR | Status: DC | PRN

## 2023-07-15 MED ORDER — ROCURONIUM BROMIDE 10 MG/ML (PF) SYRINGE
PREFILLED_SYRINGE | INTRAVENOUS | Status: AC
  Filled 2023-07-15: qty 10

## 2023-07-15 MED ORDER — VANCOMYCIN HCL 1000 MG IV SOLR
INTRAVENOUS | Status: DC | PRN
  Administered 2023-07-15: 1000 mg via TOPICAL

## 2023-07-15 MED ORDER — HEPARIN SODIUM (PORCINE) 1000 UNIT/ML IJ SOLN
3800.0000 [IU] | INTRAMUSCULAR | Status: DC | PRN
Start: 2023-07-15 — End: 2023-07-18
  Administered 2023-07-15 – 2023-07-17 (×2): 3800 [IU]
  Filled 2023-07-15 (×2): qty 4

## 2023-07-15 MED ORDER — VASOPRESSIN 20 UNIT/ML IV SOLN
INTRAVENOUS | Status: DC | PRN
  Administered 2023-07-15 (×2): 1 [IU] via INTRAVENOUS

## 2023-07-15 MED ORDER — PHENYLEPHRINE 80 MCG/ML (10ML) SYRINGE FOR IV PUSH (FOR BLOOD PRESSURE SUPPORT)
PREFILLED_SYRINGE | INTRAVENOUS | Status: DC | PRN
  Administered 2023-07-15 (×2): 160 ug via INTRAVENOUS

## 2023-07-15 MED ORDER — PROPOFOL 10 MG/ML IV BOLUS
INTRAVENOUS | Status: DC | PRN
  Administered 2023-07-15: 50 mg via INTRAVENOUS

## 2023-07-15 MED ORDER — THROMBIN 20000 UNITS EX SOLR
CUTANEOUS | Status: AC
  Filled 2023-07-15: qty 20000

## 2023-07-15 MED ORDER — SUGAMMADEX SODIUM 200 MG/2ML IV SOLN
INTRAVENOUS | Status: DC | PRN
  Administered 2023-07-15: 200 mg via INTRAVENOUS

## 2023-07-15 MED ORDER — SODIUM CHLORIDE 0.9 % IV SOLN: 10.0000 mL/h | Freq: Once | INTRAVENOUS | Status: DC

## 2023-07-15 MED ORDER — ONDANSETRON HCL 4 MG/2ML IJ SOLN
INTRAMUSCULAR | Status: AC
  Filled 2023-07-15: qty 2

## 2023-07-15 MED ORDER — MIDAZOLAM HCL 2 MG/2ML IJ SOLN
INTRAMUSCULAR | Status: AC
  Filled 2023-07-15: qty 2

## 2023-07-15 MED ORDER — VASOPRESSIN 20 UNIT/ML IV SOLN
INTRAVENOUS | Status: AC
  Filled 2023-07-15: qty 1

## 2023-07-15 MED ORDER — HYDROMORPHONE HCL 1 MG/ML IJ SOLN
1.0000 mg | INTRAMUSCULAR | Status: AC | PRN
  Administered 2023-07-15 (×2): 1 mg via INTRAVENOUS
  Filled 2023-07-15 (×2): qty 1

## 2023-07-15 MED ORDER — DEXAMETHASONE SODIUM PHOSPHATE 10 MG/ML IJ SOLN
INTRAMUSCULAR | Status: DC | PRN
  Administered 2023-07-15: 5 mg via INTRAVENOUS

## 2023-07-15 MED ORDER — ALBUMIN HUMAN 5 % IV SOLN
INTRAVENOUS | Status: DC | PRN
Start: 2023-07-15 — End: 2023-07-15

## 2023-07-15 MED ORDER — VANCOMYCIN HCL 1000 MG IV SOLR
INTRAVENOUS | Status: AC
  Filled 2023-07-15: qty 20

## 2023-07-15 MED ORDER — PHENYLEPHRINE 80 MCG/ML (10ML) SYRINGE FOR IV PUSH (FOR BLOOD PRESSURE SUPPORT)
PREFILLED_SYRINGE | INTRAVENOUS | Status: AC
  Filled 2023-07-15: qty 10

## 2023-07-15 MED ORDER — FENTANYL CITRATE (PF) 250 MCG/5ML IJ SOLN
INTRAMUSCULAR | Status: AC
  Filled 2023-07-15: qty 5

## 2023-07-15 MED ORDER — LIDOCAINE 2% (20 MG/ML) 5 ML SYRINGE
INTRAMUSCULAR | Status: AC
  Filled 2023-07-15: qty 5

## 2023-07-15 MED ORDER — PROSOURCE PLUS PO LIQD
30.0000 mL | Freq: Two times a day (BID) | ORAL | Status: DC
  Administered 2023-07-16 – 2023-07-18 (×4): 30 mL via ORAL
  Filled 2023-07-15 (×2): qty 30

## 2023-07-15 SURGICAL SUPPLY — 73 items
BAG COUNTER SPONGE SURGICOUNT (BAG) ×2 IMPLANT
BENZOIN TINCTURE PRP APPL 2/3 (GAUZE/BANDAGES/DRESSINGS) ×2 IMPLANT
BIT DRILL 13 (BIT) IMPLANT
BIT DRILL 2.4 (BIT) IMPLANT
BLADE CLIPPER SURG (BLADE) IMPLANT
BUR MATCHSTICK NEURO 3.0 LAGG (BURR) ×2 IMPLANT
CABLE BIPOLOR RESECTION CORD (MISCELLANEOUS) IMPLANT
CAGE CORP CENTERPIECE 13 D (Cage) IMPLANT
CANISTER SUCT 3000ML PPV (MISCELLANEOUS) ×2 IMPLANT
DERMABOND ADVANCED .7 DNX12 (GAUZE/BANDAGES/DRESSINGS) ×1 IMPLANT
DERMABOND ADVANCED .7 DNX6 (GAUZE/BANDAGES/DRESSINGS) IMPLANT
DRAPE C-ARM 42X72 X-RAY (DRAPES) ×4 IMPLANT
DRAPE LAPAROTOMY 100X72 PEDS (DRAPES) ×2 IMPLANT
DRAPE MICROSCOPE SLANT 54X150 (MISCELLANEOUS) ×1 IMPLANT
DRSG OPSITE POSTOP 4X6 (GAUZE/BANDAGES/DRESSINGS) ×1 IMPLANT
DRSG OPSITE POSTOP 4X8 (GAUZE/BANDAGES/DRESSINGS) IMPLANT
DURAPREP 26ML APPLICATOR (WOUND CARE) ×1 IMPLANT
DURAPREP 6ML APPLICATOR 50/CS (WOUND CARE) ×1 IMPLANT
ELECT COATED BLADE 2.86 ST (ELECTRODE) ×1 IMPLANT
ELECT REM PT RETURN 9FT ADLT (ELECTROSURGICAL) ×2
ELECTRODE REM PT RTRN 9FT ADLT (ELECTROSURGICAL) ×2 IMPLANT
EVACUATOR 1/8 PVC DRAIN (DRAIN) IMPLANT
GAUZE 4X4 16PLY ~~LOC~~+RFID DBL (SPONGE) IMPLANT
GAUZE SPONGE 4X4 12PLY STRL (GAUZE/BANDAGES/DRESSINGS) ×2 IMPLANT
GLOVE BIO SURGEON STRL SZ 6.5 (GLOVE) ×1 IMPLANT
GLOVE BIO SURGEON STRL SZ7 (GLOVE) IMPLANT
GLOVE BIOGEL PI IND STRL 6.5 (GLOVE) ×1 IMPLANT
GLOVE BIOGEL PI IND STRL 7.0 (GLOVE) IMPLANT
GLOVE BIOGEL PI IND STRL 7.5 (GLOVE) IMPLANT
GLOVE ECLIPSE 9.0 STRL (GLOVE) ×2 IMPLANT
GLOVE EXAM NITRILE XL STR (GLOVE) IMPLANT
GOWN STRL REUS W/ TWL LRG LVL3 (GOWN DISPOSABLE) IMPLANT
GOWN STRL REUS W/ TWL XL LVL3 (GOWN DISPOSABLE) ×2 IMPLANT
GOWN STRL REUS W/TWL 2XL LVL3 (GOWN DISPOSABLE) IMPLANT
GRAFT BN 5X1XSPNE CVD POST DBM (Bone Implant) IMPLANT
HALTER HD/CHIN CERV TRACTION D (MISCELLANEOUS) ×1 IMPLANT
HEMOSTAT POWDER KIT SURGIFOAM (HEMOSTASIS) IMPLANT
KIT BASIN OR (CUSTOM PROCEDURE TRAY) ×2 IMPLANT
KIT TURNOVER KIT B (KITS) ×2 IMPLANT
NDL HYPO 22X1.5 SAFETY MO (MISCELLANEOUS) ×1 IMPLANT
NDL SPNL 20GX3.5 QUINCKE YW (NEEDLE) ×1 IMPLANT
NDL SPNL 22GX3.5 QUINCKE BK (NEEDLE) ×1 IMPLANT
NEEDLE HYPO 22X1.5 SAFETY MO (MISCELLANEOUS) ×1 IMPLANT
NEEDLE SPNL 20GX3.5 QUINCKE YW (NEEDLE) ×1 IMPLANT
NEEDLE SPNL 22GX3.5 QUINCKE BK (NEEDLE) ×1 IMPLANT
NS IRRIG 1000ML POUR BTL (IV SOLUTION) ×2 IMPLANT
PACK LAMINECTOMY NEURO (CUSTOM PROCEDURE TRAY) ×2 IMPLANT
PAD ARMBOARD 7.5X6 YLW CONV (MISCELLANEOUS) ×6 IMPLANT
PIN MAYFIELD SKULL DISP (PIN) ×1 IMPLANT
PLATE ELITE SPINE 60MM (Plate) IMPLANT
PUTTY DBX 1CC (Putty) ×1 IMPLANT
PUTTY DBX 1CC DEPUY (Putty) IMPLANT
ROD PRECUT 3.5X50 (Rod) IMPLANT
ROD PRECUT 3.5X60 (Rod) IMPLANT
SCREW MA INFINITY 3.5X12 (Screw) IMPLANT
SCREW MULTI AXIAL 3.5X18MM (Screw) IMPLANT
SCREW ST FIX 4 ATL 3120213 (Screw) IMPLANT
SET SCREW INFINITY IFIX THOR (Screw) IMPLANT
SPONGE INTESTINAL PEANUT (DISPOSABLE) ×1 IMPLANT
SPONGE SURGIFOAM ABS GEL 100 (HEMOSTASIS) ×2 IMPLANT
SPONGE T-LAP 4X18 ~~LOC~~+RFID (SPONGE) IMPLANT
STAPLER VISISTAT 35W (STAPLE) IMPLANT
STRIP CLOSURE SKIN 1/2X4 (GAUZE/BANDAGES/DRESSINGS) ×2 IMPLANT
SUT VIC AB 0 CT1 18XCR BRD8 (SUTURE) ×1 IMPLANT
SUT VIC AB 2-0 CT1 18 (SUTURE) ×1 IMPLANT
SUT VIC AB 3-0 SH 8-18 (SUTURE) ×1 IMPLANT
SUT VIC AB 4-0 RB1 18 (SUTURE) ×1 IMPLANT
TAPE CLOTH 4X10 WHT NS (GAUZE/BANDAGES/DRESSINGS) ×2 IMPLANT
TAPE CLOTH SURG 4X10 WHT LF (GAUZE/BANDAGES/DRESSINGS) IMPLANT
TOWEL GREEN STERILE (TOWEL DISPOSABLE) ×2 IMPLANT
TOWEL GREEN STERILE FF (TOWEL DISPOSABLE) ×2 IMPLANT
TRAP SPECIMEN MUCUS 40CC (MISCELLANEOUS) ×1 IMPLANT
WATER STERILE IRR 1000ML POUR (IV SOLUTION) ×2 IMPLANT

## 2023-07-15 NOTE — Anesthesia Procedure Notes (Addendum)
 Procedure Name: Intubation Date/Time: 07/15/2023 7:23 PM  Performed by: Hedy Jarred, CRNAPre-anesthesia Checklist: Patient identified, Emergency Drugs available, Suction available, Patient being monitored and Timeout performed Patient Re-evaluated:Patient Re-evaluated prior to induction Oxygen Delivery Method: Circle system utilized Preoxygenation: Pre-oxygenation with 100% oxygen Induction Type: IV induction, Rapid sequence and Cricoid Pressure applied Ventilation: Mask ventilation without difficulty Laryngoscope Size: Glidescope and 4 Grade View: Grade I Tube size: 7.5 mm Number of attempts: 1 Airway Equipment and Method: Stylet and Video-laryngoscopy Placement Confirmation: ETT inserted through vocal cords under direct vision, breath sounds checked- equal and bilateral and positive ETCO2 Secured at: 23 cm Tube secured with: Tape Dental Injury: Teeth and Oropharynx as per pre-operative assessment

## 2023-07-15 NOTE — Progress Notes (Signed)
 Received patient in bed to unit.  Alert and oriented.  Informed consent signed and in chart.   TX duration: 3 hours  Patient tolerated well.  Transported back to short stay Alert, without acute distress.  Hand-off given to patient's nurse Verdie Foy and short stay nurse Access used: RIJ Access issues: none  Total UF removed: 2.5L Medication(s) given: dilaudid  1 mg Post HD VS: BP 155/75 P 80 resp 12 O2 sat 100% RA Post HD weight: 66.3kg  Radek Carnero, RN [facility]

## 2023-07-15 NOTE — Anesthesia Preprocedure Evaluation (Addendum)
 Anesthesia Evaluation  Patient identified by MRN, date of birth, ID band Patient awake    Reviewed: Allergy & Precautions, NPO status , Patient's Chart, lab work & pertinent test results  History of Anesthesia Complications (+) PONV and history of anesthetic complications  Airway Mallampati: II  TM Distance: >3 FB Neck ROM: Full    Dental  (+) Dental Advisory Given   Pulmonary    breath sounds clear to auscultation       Cardiovascular hypertension, Pt. on medications + Peripheral Vascular Disease   Rhythm:Regular Rate:Normal     Neuro/Psych CVA  negative psych ROS   GI/Hepatic negative GI ROS, Neg liver ROS,,,  Endo/Other  diabetes, Type 2, Insulin  Dependent    Renal/GU ESRF and DialysisRenal disease     Musculoskeletal  (+) Arthritis ,    Abdominal   Peds  Hematology  (+) Blood dyscrasia, anemia   Anesthesia Other Findings   Reproductive/Obstetrics                             Anesthesia Physical Anesthesia Plan  ASA: 3  Anesthesia Plan: General   Post-op Pain Management: Tylenol  PO (pre-op )*   Induction: Intravenous  PONV Risk Score and Plan: 3 and Dexamethasone , Ondansetron  and Treatment may vary due to age or medical condition  Airway Management Planned: Oral ETT  Additional Equipment: Arterial line  Intra-op Plan:   Post-operative Plan: Possible Post-op intubation/ventilation  Informed Consent: I have reviewed the patients History and Physical, chart, labs and discussed the procedure including the risks, benefits and alternatives for the proposed anesthesia with the patient or authorized representative who has indicated his/her understanding and acceptance.     Dental advisory given  Plan Discussed with: CRNA  Anesthesia Plan Comments:        Anesthesia Quick Evaluation

## 2023-07-15 NOTE — Plan of Care (Signed)

## 2023-07-15 NOTE — Progress Notes (Signed)
 Heart Failure Navigator Progress Note  Assessed for Heart & Vascular TOC clinic readiness.  Patient does not meet criteria due to ESRD on hemodialysis.   Navigator will sign off at this time.   Rhae Hammock, BSN, Scientist, clinical (histocompatibility and immunogenetics) Only

## 2023-07-15 NOTE — Brief Op Note (Signed)
 07/08/2023 - 07/15/2023  11:25 PM  PATIENT:  Shawn [name]  63 y.o. male  PRE-OPERATIVE DIAGNOSIS:  Osteomyelitis discitis  POST-OPERATIVE DIAGNOSIS:  Osteomyelitis discitis  PROCEDURE:  Procedure(s): Cervical six-Cervical seven CORPECTOMY (N/A) POSTERIOR CERVICAL FUSION /Cervical Five -Thoracic one (N/A)  SURGEON:  Surgeons and Role:    DEWAINE Louis Shove, MD - Primary  PHYSICIAN ASSISTANT:   ASSISTANTSBETHA Jennetta PIETY   ANESTHESIA:   general  EBL:  1150 mL   BLOOD ADMINISTERED:none  DRAINS: none   LOCAL MEDICATIONS USED:  NONE  SPECIMEN:  No Specimen  DISPOSITION OF SPECIMEN:  N/A  COUNTS:  YES  TOURNIQUET:  * No tourniquets in log *  DICTATION: .Dragon Dictation  PLAN OF CARE: Admit for overnight observation  PATIENT DISPOSITION:  PACU - hemodynamically stable.   Delay start of Pharmacological VTE agent (>24hrs) due to surgical blood loss or risk of bleeding: yes

## 2023-07-15 NOTE — Progress Notes (Signed)
 HOSPITALIST                ROUNDING                 NOTE Lavel Rieman FMW:[id number]  DOB: 02-21-1961  DOA: 07/08/2023  PCP: Hillman Bare, MD  07/15/2023,9:39 AM   LOS: 6 days      Code Status:  full code  From: Home  current Dispo: Unclear     63 year old black male ESRD MWF since 2011 right chest TDC-complicated by orthostatic hypotension/hypotension and bradycardic arrest in 2020 HTN DM TY 2 HLD Bilateral blindness Previous right chylothorax in 2020  Brought to emergency room 12/31 with 2-week neck pain radiating down upper back without trauma fever chills other signs of infection some tingling right hand--sodium 3.2 BUN/creatinine 20/6.2 WBC 6.0 heme 363 CXR 1 view small bilateral right greater than left pleural effusions streaky opacities right lung ESR 63, CRP 7.8 MRI = vertebral discitis osteomyelitis C6/C7 -- vertebral body height loss and retropulsion with severe canal stenosis and compression of spinal cord--  Neurosurgeon Dr. 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 Louis Pain moderately controlled only Tylenol  650 mild pain, Dilaudid  0.5 every 6 for severe-needs surgery to be able to functionally progress more  ESRD MWF through right TDC line Very restricted  dialysis access--- seen note 07/10/2023--cannot remove R TDC, only exchange continue Fosrenal 1 g 3 times daily, Rocaltrol  0.5 MWF--- phosphorus levels look good He is a little bit hyponatremic today probably because of D5 but would just monitor that  DM TY 2 A1c 5.8-Tresiba  usually 12 at bedtime Hypoglycemic episode 1/4 -eating ~ 20 % meals Cut back Semglee  10-->2-changed to every 4 coverage as n.p.o.-CBGs ranging 1 20-1 50  d5/lr midnight 40cc/h and titrate off once surgery is done   Hypertension with history of orthostatic hypotension secondary to beta-blocker Continues on amlodipine  5, Zestril  20 twice daily-blood pressures are stable   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 retinopathy Continue all eyedrops  History of CVA previously   I have been updating patient primarily-he remains in hospital because he needs neck surgery and I expect he will be here for several more days and likely will need rehab  DVT prophylaxis: Heparin   Status is: Inpatient Remains inpatient appropriate because:      Subjective:  Right arm pain moderate to severe with neck pain additionally he is a little bit weaker on the right side No fever no chills  Objective + exam Vitals:   07/14/23 2047 07/15/23 0538 07/15/23 0747 07/15/23 0800  BP: (!) 145/65 (!) 167/78 (!) 168/77 (!) 168/77  Pulse: 77 81 87   Resp: 17 18 16    Temp: 98.1 F (36.7 C) 98.4 F (36.9 C) 98.5 F (36.9 C)   TempSrc: Oral  Oral   SpO2: 99% 100% 98%   Weight:  Height:       Filed Weights   07/10/23 0810 07/10/23 1214 07/12/23 0728  Weight: 67.5 kg 65.5 kg 67 kg    Examination:  EOMI NCAT some weakness on right side Chest is clear no rales no rhonchi Abdomen is soft no rebound Straight leg raise bilaterally equal Abdomen soft  Data Reviewed: reviewed   CBC    Component Value Date/Time   WBC 7.0 07/15/2023 0056   RBC 4.44 07/15/2023 0056   HGB 11.6 (L) 07/15/2023 0056   HCT 37.0 (L) 07/15/2023 0056    PLT 142 (L) 07/15/2023 0056   MCV 83.3 07/15/2023 0056   MCH 26.1 07/15/2023 0056   MCHC 31.4 07/15/2023 0056   RDW 16.4 (H) 07/15/2023 0056   LYMPHSABS 0.9 07/15/2023 0056   MONOABS 0.5 07/15/2023 0056   EOSABS 1.2 (H) 07/15/2023 0056   BASOSABS 0.1 07/15/2023 0056      Latest Ref Rng & Units 07/15/2023   12:56 AM 07/13/2023    5:27 AM 07/12/2023    1:03 PM  CMP  Glucose 70 - 99 mg/dL 878  793  80   BUN 8 - 23 mg/dL 31  18  12    Creatinine 0.61 - 1.24 mg/dL 2.76  5.16  6.45   Sodium 135 - 145 mmol/L 127  133  132   Potassium 3.5 - 5.1 mmol/L 3.8  3.8  3.4   Chloride 98 - 111 mmol/L 90  96  94   CO2 22 - 32 mmol/L 23  25  24    Calcium  8.9 - 10.3 mg/dL 8.9  8.5  9.0     Scheduled Meds:  amLODipine   5 mg Oral Daily   aspirin  EC  325 mg Oral QHS   atorvastatin   80 mg Oral Daily   brimonidine   1 drop Both Eyes BID   calcitRIOL   0.5 mcg Oral Q M,W,F   Chlorhexidine  Gluconate Cloth  6 each Topical Q0600   dorzolamide -timolol   1 drop Both Eyes BID   heparin   5,000 Units Subcutaneous Q8H   insulin  aspart  0-9 Units Subcutaneous TID WC   insulin  glargine-yfgn  2 Units Subcutaneous QHS   lanthanum   1,000 mg Oral TID WC   latanoprost   1 drop Both Eyes QHS   lisinopril   20 mg Oral BID   Continuous Infusions:   ceFAZolin  (ANCEF ) IV     dextrose  5% lactated ringers  40 mL/hr at 07/14/23 2355   vancomycin  (VANCOCIN ) 750 mg in sodium chloride  0.9 % 250 mL IVPB      Time  25  Colen Grimes, MD  [facility]

## 2023-07-15 NOTE — Interval H&P Note (Signed)
 History and Physical Interval Note:  07/15/2023 11:21 AM  Shawn [name]  has presented today for surgery, with the diagnosis of Osteomyelitis discitis.  The various methods of treatment have been discussed with the patient and family. After consideration of risks, benefits and other options for treatment, the patient has consented to  Procedure(s): C6-C7 CORPECTOMY (N/A) POSTERIOR CERVICAL FUSION/C5-TI (N/A) as a surgical intervention.  The patient's history has been reviewed, patient examined, no change in status, stable for surgery.  I have reviewed the patient's chart and labs.  Questions were answered to the patient's satisfaction.     [facility] Trichelle Lehan

## 2023-07-15 NOTE — Progress Notes (Signed)
 [facility] Progress Note   Subjective:   Seen in room - feels ok except neck and R shoulder pain. Scheduled for surgery today after dialysis.  Objective Vitals:   07/14/23 2047 07/15/23 0538 07/15/23 0747 07/15/23 0800  BP: (!) 145/65 (!) 167/78 (!) 168/77 (!) 168/77  Pulse: 77 81 87   Resp: 17 18 16    Temp: 98.1 F (36.7 C) 98.4 F (36.9 C) 98.5 F (36.9 C)   TempSrc: Oral  Oral   SpO2: 99% 100% 98%   Weight:      Height:       Physical Exam General: Blind, NAD. C-collar in place. Heart: RRR, 3/6 murmur Lungs: CTA anteriorly Abdomen: soft Extremities: no LE edema Dialysis Access: R [facility]  Additional Objective Labs: Basic Metabolic Panel: Recent Labs  Lab 07/09/23 1920 07/11/23 0455 07/12/23 1303 07/13/23 0527 07/15/23 0056  NA 135   < > 132* 133* 127*  K 3.3*   < > 3.4* 3.8 3.8  CL 97*   < > 94* 96* 90*  CO2 24   < > 24 25 23   GLUCOSE 97   < > 80 206* 121*  BUN 23   < > 12 18 31*  CREATININE 6.82*   < > 3.54* 4.83* 7.23*  CALCIUM  8.6*   < > 9.0 8.5* 8.9  PHOS 3.9  --   --   --  3.2   < > = values in this interval not displayed.   Liver Function Tests: Recent Labs  Lab 07/09/23 1920 07/15/23 0056  ALBUMIN  2.7* 2.9*   CBC: Recent Labs  Lab 07/09/23 1150 07/09/23 1920 07/13/23 0527 07/14/23 1224 07/15/23 0056  WBC 6.0 5.6 6.3 6.0 7.0  NEUTROABS  --   --  4.1 3.8 4.2  HGB 11.5* 9.7* 9.9* 11.0* 11.6*  HCT 36.9* 30.9* 31.5* 34.9* 37.0*  MCV 84.4 84.4 84.0 82.5 83.3  PLT 363 340 313 313 142*   Blood Culture    Component Value Date/Time   SDES BLOOD RIGHT ARM 07/11/2023 1549   SPECREQUEST  07/11/2023 1549    BOTTLES DRAWN AEROBIC ONLY Blood Culture results may not be optimal due to an inadequate volume of blood received in culture bottles   CULT  07/11/2023 1549    NO GROWTH 4 DAYS Performed at [facility], [address], [place], [place] [address]    REPTSTATUS PENDING 07/11/2023 1549   Medications:   ceFAZolin   (ANCEF ) IV     dextrose  5% lactated ringers  40 mL/hr at 07/14/23 2355   vancomycin  (VANCOCIN ) 750 mg in sodium chloride  0.9 % 250 mL IVPB      amLODipine   5 mg Oral Daily   aspirin  EC  325 mg Oral QHS   atorvastatin   80 mg Oral Daily   brimonidine   1 drop Both Eyes BID   calcitRIOL   0.5 mcg Oral Q M,W,F   Chlorhexidine  Gluconate Cloth  6 each Topical Q0600   dorzolamide -timolol   1 drop Both Eyes BID   heparin   5,000 Units Subcutaneous Q8H   insulin  aspart  0-9 Units Subcutaneous TID WC   insulin  glargine-yfgn  2 Units Subcutaneous QHS   lanthanum   1,000 mg Oral TID WC   latanoprost   1 drop Both Eyes QHS   lisinopril   20 mg Oral BID    Dialysis Orders MWF - [place] 3:30hr, 400/A1.5, EDW 69.9kg, 2K/2.5Ca, TDC, heparin  6500 unit bolus + 2900 unit mid-run bolus - Mircera 120mcg IV q 2 weeks -  last 12/22 - Calcitriol  0.5mcg PO q HD  Assessment/Plan: Cervical discitis/osteomyelitis: Blood Cx Staph Epi - on Vanc Cervical spine stenosis/compression: Neurosurgery following, planned fusion 1/6. Per anaesthesilogy - needs Na corrected with HD prior to surgery. ESRD: Usual MWF schedule - for HD today prior to surgery. 3K bath, UF as tolerated to correct Na. HTN/volume: BP high, UF as tolerated with HD - trying 3L. He is below prior EDW. Anemia of ESRD: Hgb 11.6 - not ESA for now. Secondary HPTH: Ca/Phos fine - continue VDRA, no binder for now. Nutrition: Alb low, adding supplements. T2DM (with retinopathy/blindness)   Izetta Boehringer, PA-C 07/15/2023, 11:41 AM  [facility]

## 2023-07-15 NOTE — Op Note (Signed)
 Date of procedure: 07/15/2023  Date of dictation: Same  Service: Neurosurgery  Preoperative diagnosis: C6, C7 osteomyelitis discitis with severe stenosis and myelopathy  Postoperative diagnosis: Same  Procedure Name: C6 and C7 anterior cervical corpectomy, microdissection  C5-T1 anterior cervical fusion utilizing interbody cage, local harvested autograft, and morselized allograft  C5-T1 anterior plate fixation  C5-T1 posterior cervical fusion utilizing segmental lateral mass and pedicle screw instrumentation with morselized allograft  Surgeon:Izabela Ow A.Flemon Kelty, M.D.  Asst. Surgeon: Jennetta, NP  Anesthesia: General  Indication: 63 year old male with multiple medical problems including end-stage renal disease on hemodialysis presents with neck pain and upper extremity sensory loss pain and weakness.  Workup demonstrates evidence of destructive process at C6 and C7 consistent with osteomyelitis discitis.  Blood cultures positive for Staph epidermidis.  Sedimentation rate and CRP elevated.  Patient presents now for anterior posterior decompression and fusion.  Operative note: After induction of anesthesia, patient positioned supine with neck slightly extended and held placeholder traction.  Patient's anterior cervical region prepped and draped sterilely.  Incision made on the right.  Dissection performed the right.  Retractor placed.  Fluoroscopy used.  Levels confirmed.  Disc base at C5-6 and C7-T1 identified.  Disc bases incised and discectomy performed using various instruments down to the posterior annulus.  The vertebral bodies of C6 and C7 were largely destroyed.  There was no evidence of any active purulence only chronic granulomatous change.  Microscope brought in field used microdissection.  Using high-speed drill Kerrison rongeurs osteophyte removers and other biting instruments corpectomies of C6 and C7 performed down to level the posterior aspect of the body.  The body was dissected free  and removed in piecemeal fashion using Kerrison rongeurs.  Posterior logical ligament and reactive tissue from the infection was dissected free and resected from C5 down to T1.  Wide central decompression was performed.  Foraminotomies performed on course the exiting C6-C7 and C8 nerve roots.  At this point a very thorough decompression achieved.  There was no evidence of injury to the thecal sac or nerve roots.  Wound was then irrigated.  Hemostasis was achieved with bipolar cautery and Surgifoam which was then evacuated clear.  A Medtronic expandable cage was then placed from C5-T1.  This is then then expanded with good distraction of the affected levels.  The cage was locked in place.  The cage was then packed with morselized autograft and allograft.  Final images reveal good position of the cage at the proper operative level with normal alignment of spine.  Atlantis anterior cervical plate was then placed over the C5 and T1 levels.  This then attached under fluoroscopic guidance using 13 mm fixed angle screws to each at both levels.  All 4 screws given final tightening found to be solidly within the bone.  Locking screws engaged both levels.  Once again images reveal good position of the hardware at the proper level with normal alignment of spine.  A medium Hemovac drain was left in the prevertebral space.  Wound was then closed in layers with Vicryl sutures.  Steri-Strips and sterile dressing were applied.  Patient was then taken off the operative bed and then turned prone.  His head was fixed in [place] pin headrest.  Posterior cervical region prepped and draped sterilely.  Incision made from C5-T1.  Dissection performed bilaterally.  Retractor placed.  Fluoroscopy used.  Levels confirmed.  Pilot holes were then made in the mid position of the lateral mass of C5-C6 and C7.  Pilot holes  were then drilled and the lateral mass at her trajectory approximately 20 degrees cephalad and 20 degrees lateral.  Pilot  holes were probed and found to be solidly within bone.  12 mm Medtronic lateral mass screws were placed in C5 and C6 bilaterally and and C7 on the right side.  On the left side at C7 the lateral mass was too small to except a screw.  Pedicle of T1 was identified using surface landmarks and intraoperative fluoroscopy report pilot holes were drilled over the pedicle.  Pedicle was then probed using a pedicle awl.  Each pedicle all track was then probed and found to be solidly within the bone.  Pedicle tract was then tapped with a screw tap.  Screw table was probed and found to be solidly within the bone.  18 mm vertex screw from Medtronic was then placed bilaterally.  Short segment titanium rod placed over the screw heads from C5-T1.  Locking caps placed over the screws.  Locking caps then engaged with the construct under mild compression.  Final images reveal good position of the anterior and posterior hardware with normal alignment of spine.  Wound was then irrigated.  Lamina and facet joints were decorticated.  Morselized allograft was packed along the lamina.  Vancomycin  powder placed the deep wound space.  Wounds then closed in layers with Vicryl sutures.  Steri-Strips and sterile dressing were applied.  No apparent complications.  Patient tolerated the procedure well and he returns to recovery room postop.

## 2023-07-15 NOTE — Anesthesia Procedure Notes (Signed)
 Arterial Line Insertion Start/End1/12/2023 6:55 PM, 07/15/2023 7:00 PM Performed by: Jama Powell NOVAK, CRNA, CRNA  Patient location: OR. Preanesthetic checklist: patient identified, IV checked, site marked, risks and benefits discussed, surgical consent, monitors and equipment checked, pre-op  evaluation, timeout performed and anesthesia consent Lidocaine  1% used for infiltration Left, radial was placed Catheter size: 20 G Hand hygiene performed  and maximum sterile barriers used  Allen's test indicative of satisfactory collateral circulation Attempts: 1 Procedure performed using ultrasound guided technique. Following insertion, dressing applied and Biopatch. Post procedure assessment: normal  Patient tolerated the procedure well with no immediate complications.

## 2023-07-15 NOTE — Transfer of Care (Signed)
 Immediate Anesthesia Transfer of Care Note  Patient: Shawn [name]  Procedure(s) Performed: Cervical six-Cervical seven CORPECTOMY (Neck) POSTERIOR CERVICAL FUSION /Cervical Five -Thoracic one (Neck)  Patient Location: PACU  Anesthesia Type:General  Level of Consciousness: awake and alert   Airway & Oxygen Therapy: Patient Spontanous Breathing and Patient connected to face mask oxygen  Post-op Assessment: Report given to RN and Post -op Vital signs reviewed and stable  Post vital signs: Reviewed and stable  Last Vitals:  Vitals Value Taken Time  BP 144/62 07/15/23 2347  Temp    Pulse 68 07/15/23 2351  Resp 13 07/15/23 2353  SpO2 100 % 07/15/23 2351  Vitals shown include unfiled device data.  Last Pain:  Vitals:   07/15/23 1841  TempSrc: Oral  PainSc: 0-No pain      Patients Stated Pain Goal: 0 (07/15/23 1326)  Complications: No notable events documented.

## 2023-07-15 NOTE — Progress Notes (Signed)
 Patient remains on IV antibiotics for treatment of his cervical osteomyelitis discitis.  Patient continues to have neck pain with radiating pain numbness and weakness into both upper extremities right worse than left.  Once again I have discussed options for treatment of his condition.  I recommended that we move forward with anterior C6 and C7 corpectomies followed by strut graft fusion from C5-T1 utilizing interbody cage and locally harvested autograft with anterior plate fixation.  This will be coupled with posterior segmental fixation from C5-T1.  I discussed the risks involved with surgery including but not limited to risk anesthesia, bleeding, infection, CSF leak, nerve root injury, spinal cord injury, fusion failure, end-stage failure, continued pain, and none benefit.  Patient has been given the option of his questions.  Appears understand.  He wishes proceed with surgery.

## 2023-07-16 DIAGNOSIS — Z5181 Encounter for therapeutic drug level monitoring: Secondary | ICD-10-CM

## 2023-07-16 DIAGNOSIS — M4622 Osteomyelitis of vertebra, cervical region: Secondary | ICD-10-CM | POA: Diagnosis not present

## 2023-07-16 LAB — CBC WITH DIFFERENTIAL/PLATELET
Abs Immature Granulocytes: 0.11 10*3/uL — ABNORMAL HIGH (ref 0.00–0.07)
Basophils Absolute: 0 10*3/uL (ref 0.0–0.1)
Basophils Relative: 0 %
Eosinophils Absolute: 0 10*3/uL (ref 0.0–0.5)
Eosinophils Relative: 0 %
HCT: 35.1 % — ABNORMAL LOW (ref 39.0–52.0)
Hemoglobin: 11.1 g/dL — ABNORMAL LOW (ref 13.0–17.0)
Immature Granulocytes: 1 %
Lymphocytes Relative: 2 %
Lymphs Abs: 0.3 10*3/uL — ABNORMAL LOW (ref 0.7–4.0)
MCH: 26.6 pg (ref 26.0–34.0)
MCHC: 31.6 g/dL (ref 30.0–36.0)
MCV: 84 fL (ref 80.0–100.0)
Monocytes Absolute: 0.4 10*3/uL (ref 0.1–1.0)
Monocytes Relative: 2 %
Neutro Abs: 16.1 10*3/uL — ABNORMAL HIGH (ref 1.7–7.7)
Neutrophils Relative %: 95 %
Platelets: 316 10*3/uL (ref 150–400)
RBC: 4.18 MIL/uL — ABNORMAL LOW (ref 4.22–5.81)
RDW: 15.9 % — ABNORMAL HIGH (ref 11.5–15.5)
WBC: 17 10*3/uL — ABNORMAL HIGH (ref 4.0–10.5)
nRBC: 0 % (ref 0.0–0.2)

## 2023-07-16 LAB — TYPE AND SCREEN
ABO/RH(D): AB POS
Antibody Screen: NEGATIVE
Unit division: 0
Unit division: 0
Unit division: 0
Unit division: 0

## 2023-07-16 LAB — POCT I-STAT 7, (LYTES, BLD GAS, ICA,H+H)
Acid-base deficit: 4 mmol/L — ABNORMAL HIGH (ref 0.0–2.0)
Acid-base deficit: 5 mmol/L — ABNORMAL HIGH (ref 0.0–2.0)
Bicarbonate: 18.2 mmol/L — ABNORMAL LOW (ref 20.0–28.0)
Bicarbonate: 23.4 mmol/L (ref 20.0–28.0)
Calcium, Ion: 0.99 mmol/L — ABNORMAL LOW (ref 1.15–1.40)
Calcium, Ion: 1.15 mmol/L (ref 1.15–1.40)
HCT: 26 % — ABNORMAL LOW (ref 39.0–52.0)
HCT: 31 % — ABNORMAL LOW (ref 39.0–52.0)
Hemoglobin: 10.5 g/dL — ABNORMAL LOW (ref 13.0–17.0)
Hemoglobin: 8.8 g/dL — ABNORMAL LOW (ref 13.0–17.0)
O2 Saturation: 100 %
O2 Saturation: 100 %
Patient temperature: 36
Patient temperature: 36.2
Potassium: 3 mmol/L — ABNORMAL LOW (ref 3.5–5.1)
Potassium: 4.1 mmol/L (ref 3.5–5.1)
Sodium: 135 mmol/L (ref 135–145)
Sodium: 138 mmol/L (ref 135–145)
TCO2: 19 mmol/L — ABNORMAL LOW (ref 22–32)
TCO2: 25 mmol/L (ref 22–32)
pCO2 arterial: 27.3 mm[Hg] — ABNORMAL LOW (ref 32–48)
pCO2 arterial: 48.4 mm[Hg] — ABNORMAL HIGH (ref 32–48)
pH, Arterial: 7.287 — ABNORMAL LOW (ref 7.35–7.45)
pH, Arterial: 7.429 (ref 7.35–7.45)
pO2, Arterial: 205 mm[Hg] — ABNORMAL HIGH (ref 83–108)
pO2, Arterial: 249 mm[Hg] — ABNORMAL HIGH (ref 83–108)

## 2023-07-16 LAB — CULTURE, BLOOD (ROUTINE X 2)
Culture: NO GROWTH
Culture: NO GROWTH

## 2023-07-16 LAB — BASIC METABOLIC PANEL
Anion gap: 15 (ref 5–15)
BUN: 14 mg/dL (ref 8–23)
CO2: 21 mmol/L — ABNORMAL LOW (ref 22–32)
Calcium: 9.1 mg/dL (ref 8.9–10.3)
Chloride: 97 mmol/L — ABNORMAL LOW (ref 98–111)
Creatinine, Ser: 4.75 mg/dL — ABNORMAL HIGH (ref 0.61–1.24)
GFR, Estimated: 13 mL/min — ABNORMAL LOW (ref >60)
Glucose, Bld: 171 mg/dL — ABNORMAL HIGH (ref 70–99)
Potassium: 4.7 mmol/L (ref 3.5–5.1)
Sodium: 133 mmol/L — ABNORMAL LOW (ref 135–145)

## 2023-07-16 LAB — GLUCOSE, CAPILLARY
Glucose-Capillary: 110 mg/dL — ABNORMAL HIGH (ref 70–99)
Glucose-Capillary: 151 mg/dL — ABNORMAL HIGH (ref 70–99)
Glucose-Capillary: 175 mg/dL — ABNORMAL HIGH (ref 70–99)
Glucose-Capillary: 241 mg/dL — ABNORMAL HIGH (ref 70–99)
Glucose-Capillary: 245 mg/dL — ABNORMAL HIGH (ref 70–99)

## 2023-07-16 LAB — BPAM RBC
Blood Product Expiration Date: 202501262359
Blood Product Expiration Date: 202501262359
Blood Product Expiration Date: 202501272359
Blood Product Expiration Date: 202501272359
ISSUE DATE / TIME: 202501061020
ISSUE DATE / TIME: 202501061020
ISSUE DATE / TIME: 202501062228
ISSUE DATE / TIME: 202501071202
Unit Type and Rh: 6200
Unit Type and Rh: 6200
Unit Type and Rh: 8400
Unit Type and Rh: 8400

## 2023-07-16 MED ORDER — SODIUM CHLORIDE 0.9 % IV SOLN: 250.0000 mL | INTRAVENOUS | Status: AC

## 2023-07-16 MED ORDER — CYCLOBENZAPRINE HCL 10 MG PO TABS
10.0000 mg | ORAL_TABLET | Freq: Three times a day (TID) | ORAL | Status: DC | PRN
  Administered 2023-07-16 – 2023-07-17 (×2): 10 mg via ORAL
  Filled 2023-07-16 (×2): qty 1

## 2023-07-16 MED ORDER — SODIUM CHLORIDE 0.9% FLUSH: 3.0000 mL | INTRAVENOUS | Status: DC | PRN

## 2023-07-16 MED ORDER — CEFAZOLIN SODIUM-DEXTROSE 1-4 GM/50ML-% IV SOLN
1.0000 g | Freq: Three times a day (TID) | INTRAVENOUS | Status: DC
  Administered 2023-07-16: 1 g via INTRAVENOUS
  Filled 2023-07-16 (×2): qty 50

## 2023-07-16 MED ORDER — SODIUM CHLORIDE 0.9% FLUSH
3.0000 mL | Freq: Two times a day (BID) | INTRAVENOUS | Status: DC
  Administered 2023-07-16 – 2023-07-18 (×5): 3 mL via INTRAVENOUS

## 2023-07-16 MED ORDER — MENTHOL 3 MG MT LOZG: 1.0000 | LOZENGE | OROMUCOSAL | Status: DC | PRN

## 2023-07-16 MED ORDER — HYDROCODONE-ACETAMINOPHEN 5-325 MG PO TABS
1.0000 | ORAL_TABLET | ORAL | Status: DC | PRN
  Administered 2023-07-16: 1 via ORAL
  Filled 2023-07-16: qty 1

## 2023-07-16 MED ORDER — HEPARIN SODIUM (PORCINE) 1000 UNIT/ML IJ SOLN
1.9000 mL | Freq: Once | INTRAMUSCULAR | Status: AC
  Administered 2023-07-16: 1.9 mL via INTRAVENOUS
  Filled 2023-07-16: qty 1.9

## 2023-07-16 MED ORDER — PHENOL 1.4 % MT LIQD: 1.0000 | OROMUCOSAL | Status: DC | PRN

## 2023-07-16 MED ORDER — HYDROCODONE-ACETAMINOPHEN 10-325 MG PO TABS: 2.0000 | ORAL_TABLET | ORAL | Status: DC | PRN

## 2023-07-16 NOTE — Evaluation (Signed)
 Physical Therapy Evaluation Patient Details Name: Shawn [name] MRN: [id number] DOB: [birth year] Today's Date: 07/16/2023  History of Present Illness  Pt is 63 year old presented to [facility] on  07/08/24 for neck pain and tingling in rt hand. 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 Pt with RUE weakness. Pt placed in cervical collar and scheduled for cervical fusion on 1/6; s/p cervical fusion and corpectomy 1/6, brace when OOB; PMH - CVA, DM, HTN, ESRD on HD, blind.  Clinical Impression   Pt admitted with above diagnosis. Lives at home with his wife, Apolinar, in a single-level home with a level entry; Prior to admission, pt was able to walk with a cane inside the house, with a rollator in the community; Presents to PT with weakness, dyscoordination, decr sitting balance, a functional decline compare to his baseline; Needed mod assis tto sit EOB with bil UE propping; unsteady sitting, and opted to lay back down; Rec post-acute rehab to maximize independence and safety with mobility and ADLs;  Pt currently with functional limitations due to the deficits listed below (see PT Problem List). Pt will benefit from skilled PT to increase their independence and safety with mobility to allow discharge to the venue listed below.           If plan is discharge home, recommend the following: A lot of help with walking and/or transfers;A lot of help with bathing/dressing/bathroom   Can travel by private vehicle        Equipment Recommendations None recommended by PT  Recommendations for Other Services       Functional Status Assessment Patient has had a recent decline in their functional status and demonstrates the ability to make significant improvements in function in a reasonable and predictable amount of time.     Precautions / Restrictions Precautions Precautions: Cervical;Fall Precaution Comments: Pt has significant visual impairment       Mobility  Bed Mobility Overal bed mobility: Needs Assistance Bed Mobility: Rolling, Supine to Sit, Sit to Supine Rolling: Mod assist   Supine to sit: Mod assist, Used rails Sit to supine: Mod assist, +2 for safety/equipment   General bed mobility comments: Tactile cueing and hand over hand assist to find rail for roll R; heavy mod assist to come to sit; Heavy mod assist to help LEs back into bed and reposition back in supine    Transfers                   General transfer comment: Did not test today; incr pain and fatigue after spending time sitting EOB    Ambulation/Gait                  Stairs            Wheelchair Mobility     Tilt Bed    Modified Rankin (Stroke Patients Only)       Balance Overall balance assessment: Needs assistance Sitting-balance support: Bilateral upper extremity supported Sitting balance-Leahy Scale: Poor Sitting balance - Comments: Sat EOB for at least 10 minutes; Needing up to heavy mod assist for steadying and balance EOB                                     Pertinent Vitals/Pain Pain Assessment Pain Assessment: 0-10 Pain Score: 8  Pain Location: Neck, especially posterior aspect of neck Pain Descriptors /  Indicators: Discomfort, Grimacing, Guarding Pain Intervention(s): Monitored during session, Patient requesting pain meds-RN notified    Home Living Family/patient expects to be discharged to:: Private residence Living Arrangements: Spouse/significant other Available Help at Discharge: Family;Available 24 hours/day Type of Home: Apartment Home Access: Level entry       Home Layout: One level Home Equipment: BSC/3in1;Cane - single point;Rollator (4 wheels);Shower seat - built in      Prior Function Prior Level of Function : Independent/Modified Independent             Mobility Comments: SPC in the house, RW in the community, wheelchair for HD ADLs Comments: Reports Indep, Wife does  all IADLs for pt.     Extremity/Trunk Assessment   Upper Extremity Assessment Upper Extremity Assessment: Defer to OT evaluation    Lower Extremity Assessment Lower Extremity Assessment: Generalized weakness    Cervical / Trunk Assessment Cervical / Trunk Assessment: Neck Surgery  Communication   Communication Communication: No apparent difficulties  Cognition Arousal: Alert Behavior During Therapy: WFL for tasks assessed/performed Overall Cognitive Status: Within Functional Limits for tasks assessed                                          General Comments General comments (skin integrity, edema, etc.): Wife, Apolinar, present and supoprtive    Exercises     Assessment/Plan    PT Assessment Patient needs continued PT services  PT Problem List Decreased strength;Decreased range of motion;Decreased activity tolerance;Decreased balance;Decreased mobility;Decreased coordination;Decreased knowledge of use of DME;Decreased safety awareness;Decreased knowledge of precautions;Pain       PT Treatment Interventions DME instruction;Gait training;Functional mobility training;Therapeutic activities;Therapeutic exercise;Balance training;Neuromuscular re-education;Patient/family education;Wheelchair mobility training    PT Goals (Current goals can be found in the Care Plan section)  Acute Rehab PT Goals Patient Stated Goal: less pain and get home PT Goal Formulation: With patient Time For Goal Achievement: 07/30/23 Potential to Achieve Goals: Good    Frequency Min 1X/week     Co-evaluation               AM-PAC PT 6 Clicks Mobility  Outcome Measure Help needed turning from your back to your side while in a flat bed without using bedrails?: A Little Help needed moving from lying on your back to sitting on the side of a flat bed without using bedrails?: A Lot Help needed moving to and from a bed to a chair (including a wheelchair)?: A Lot Help needed  standing up from a chair using your arms (e.g., wheelchair or bedside chair)?: Total Help needed to walk in hospital room?: Total Help needed climbing 3-5 steps with a railing? : Total 6 Click Score: 10    End of Session Equipment Utilized During Treatment: Cervical collar Activity Tolerance: Patient tolerated treatment well Patient left: in bed;with call bell/phone within reach;with bed alarm set (bed in semi-chair position) Nurse Communication: Mobility status PT Visit Diagnosis: Unsteadiness on feet (R26.81);Other abnormalities of gait and mobility (R26.89);Muscle weakness (generalized) (M62.81)    Time: 8952-8866 (minus approx 10 minutes as Neurosurgery and Infectios Dx docs saw pt) PT Time Calculation (min) (ACUTE ONLY): 46 min   Charges:   PT Evaluation $PT Eval Moderate Complexity: 1 Mod PT Treatments $Therapeutic Activity: 8-22 mins PT General Charges $$ ACUTE PT VISIT: 1 Visit         Silvano Currier, PT  Acute Rehabilitation Services  Office [phone] Secure Chat welcomed   Silvano VEAR Currier 07/16/2023, 2:30 PM

## 2023-07-16 NOTE — Progress Notes (Signed)
 HOSPITALIST                ROUNDING                 NOTE Shawn [name] FMW:[id number]  DOB: October 04, 1960  DOA: 07/08/2023  PCP: Hillman Bare, MD  07/16/2023,4:56 PM   LOS: 7 days      Code Status:  full code  From: Home  current Dispo: Unclear     63 year old black male ESRD MWF since 2011 right chest TDC-complicated by orthostatic hypotension/hypotension and bradycardic arrest in 2020 HTN DM TY 2 HLD Bilateral blindness Previous right chylothorax in 2020  Brought to emergency room 12/31 with 2-week neck pain radiating down upper back without trauma fever chills other signs of infection some tingling right hand--sodium 3.2 BUN/creatinine 20/6.2 WBC 6.0 heme 363 CXR 1 view small bilateral right greater than left pleural effusions streaky opacities right lung ESR 63, CRP 7.8 MRI = vertebral discitis osteomyelitis C6/C7 -- vertebral body height loss and retropulsion with severe canal stenosis and compression of spinal cord--  Neurosurgeon Dr. 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 Louis Tylenol  650 mild pain, Dilaudid  0.5 every 6 for severe, for moderate pain will give 2 tablets Norco every 4 as needed pain--- pain is not well-controlled today and is mainly in his neck but his arms are stronger CIR is interested in him and  hopefully he can go there and maximize  ESRD MWF through right TDC line Very restricted dialysis access--- seen note 07/10/2023--cannot remove R TDC, only exchange continue Fosrenal 1 g 3 times daily, Rocaltrol  0.5 MWF--- check a.m. Phos He is a little bit hyponatremic today probably because of D5 but would just monitor that  DM TY 2 A1c 5.8-Tresiba  usually 12 at bedtime Hypoglycemic episode 1/4 -eating ~ 20 % meals Cut back Semglee  10-->2-changed to every 4 coverage as n.p.o.-CBGs 1 50-2 50 Suspect will need less long-acting at discharge  Hypertension with history of orthostatic hypotension secondary to beta-blocker Continues on amlodipine  5, Zestril  20 twice daily-blood pressures are stable  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 retinopathy Continue all eyedrops  History of CVA previously     Likely require CIR  DVT prophylaxis: Heparin   Status is: Inpatient Remains inpatient appropriate because:      Subjective:  Right arm is improved he feels stronger in that arm Mainly neck pain and soreness in the throat no fever no chills ROM is intact  Objective + exam Vitals:   07/16/23 0058 07/16/23 0451 07/16/23 0926 07/16/23 1551  BP: (!) 123/57 (!) 147/70 119/60 (!) 126/57  Pulse: 74 91 99 88  Resp: 18 19 16 17   Temp: 97.6 F (36.4 C) 97.8 F (36.6 C) 98.1 F (36.7 C) 98.2 F (36.8 C)  TempSrc:   Oral Oral  SpO2: 99% 100% 98% 99%  Weight:      Height:       Filed Weights   07/12/23 0728 07/15/23 1403  07/15/23 1728  Weight: 67 kg 69 kg 66.3 kg    Examination:  EOMI NCAT some weakness on right side but is improved Chest is clear no rales no rhonchi S1-S2 no murmur Abdomen is soft no rebound Straight leg raise bilaterally equal Abdomen soft  Data Reviewed: reviewed   CBC    Component Value Date/Time   WBC 17.0 (H) 07/16/2023 0412   RBC 4.18 (L) 07/16/2023 0412   HGB 11.1 (L) 07/16/2023 0412   HCT 35.1 (L) 07/16/2023 0412   PLT 316 07/16/2023 0412    MCV 84.0 07/16/2023 0412   MCH 26.6 07/16/2023 0412   MCHC 31.6 07/16/2023 0412   RDW 15.9 (H) 07/16/2023 0412   LYMPHSABS 0.3 (L) 07/16/2023 0412   MONOABS 0.4 07/16/2023 0412   EOSABS 0.0 07/16/2023 0412   BASOSABS 0.0 07/16/2023 0412      Latest Ref Rng & Units 07/16/2023    4:12 AM 07/16/2023   12:01 AM 07/15/2023    9:42 PM  CMP  Glucose 70 - 99 mg/dL 828     BUN 8 - 23 mg/dL 14     Creatinine 9.38 - 1.24 mg/dL 5.24     Sodium 864 - 854 mmol/L 133  135  138   Potassium 3.5 - 5.1 mmol/L 4.7  4.1  3.0   Chloride 98 - 111 mmol/L 97     CO2 22 - 32 mmol/L 21     Calcium  8.9 - 10.3 mg/dL 9.1       Scheduled Meds:  (feeding supplement) PROSource Plus  30 mL Oral BID BM   amLODipine   5 mg Oral Daily   aspirin  EC  325 mg Oral QHS   atorvastatin   80 mg Oral Daily   brimonidine   1 drop Both Eyes BID   calcitRIOL   0.5 mcg Oral Q M,W,F   Chlorhexidine  Gluconate Cloth  6 each Topical Q0600   dorzolamide -timolol   1 drop Both Eyes BID   heparin   5,000 Units Subcutaneous Q8H   insulin  aspart  0-9 Units Subcutaneous TID WC   insulin  glargine-yfgn  2 Units Subcutaneous QHS   lanthanum   1,000 mg Oral TID WC   latanoprost   1 drop Both Eyes QHS   lisinopril   20 mg Oral BID   sodium chloride  flush  3 mL Intravenous Q12H   Continuous Infusions:  sodium chloride      sodium chloride      sodium chloride      heparin  sodium (porcine)     vancomycin  (VANCOCIN ) 750 mg in sodium chloride  0.9 % 250 mL IVPB      Time  25  Colen Grimes, MD  [facility]

## 2023-07-16 NOTE — Progress Notes (Signed)
 Outpatient IV Antibiotic Therapy   Indication: MRSE discitis  Regimen: Vancomycin  750 mg every HD MWF  End date: 08/26/23   No formal OPAT will be done as patient will receive antibiotics with hemodialysis. Nephrology aware.     Thank you for allowing pharmacy to be a part of this patient's care.  Damien Quiet, PharmD, BCPS, BCIDP Infectious Diseases Clinical Pharmacist Phone: [phone] 07/16/2023, 2:24 PM

## 2023-07-16 NOTE — TOC CM/SW Note (Signed)
 Transition of Care [facility]) - Inpatient Brief Assessment   Patient Details  Name: Shawn [name] MRN: [id number] Date of Birth: 08-Jul-1961  [facility]) CM/SW Contact:    Tom-Johnson, Harvest Muskrat, RN Phone Number: 07/16/2023, 4:36 PM   Clinical Narrative:  Patient presented to the ED with Neck Pain and bilateral Shoulders that radiates to bilateral Forearms to his Fingers. Admitted with Acute Cervical Spine Osteomyelitis/Discitis from C6-C7. ID following.  Patient underwent  C6-C7 Corpectomy, Posterior Cervical Fusion C5-T1 yesterday 07/15/23. Neurosx also following. Has a Cervical Collar in place.   Patient is from home with wife, has three step children that are out of town.  Patient is Legally blind, needs minimal assist at home, wife assist with care.  Has a cane, walker at home.  Patient goes to outpatient dialysis on a MWF schedule and uses [facility] to and from dialysis.  PCP is Hillman Bare, MD and uses [facility] mail order script and [facility] on [address].  CIR recommended, following for possible admit when patient is Medically ready.  CM will continue to follow as patient progresses towards discharge.            Transition of Care Asessment: Insurance and Status: Insurance coverage has been reviewed Patient has primary care physician: Yes Home environment has been reviewed: Yes Prior level of function:: Modified Independent Prior/Current Home Services: No current home services Social Drivers of Health Review: SDOH reviewed no interventions necessary Readmission risk has been reviewed: Yes Transition of care needs: transition of care needs identified, TOC will continue to follow

## 2023-07-16 NOTE — Anesthesia Postprocedure Evaluation (Signed)
 Anesthesia Post Note  Patient: Daniyal Tabor  Procedure(s) Performed: Cervical six-Cervical seven CORPECTOMY (Neck) POSTERIOR CERVICAL FUSION /Cervical Five -Thoracic one (Neck)     Patient location during evaluation: PACU Anesthesia Type: General Level of consciousness: awake and alert Pain management: pain level controlled Vital Signs Assessment: post-procedure vital signs reviewed and stable Respiratory status: spontaneous breathing, nonlabored ventilation, respiratory function stable and patient connected to nasal cannula oxygen Cardiovascular status: blood pressure returned to baseline and stable Postop Assessment: no apparent nausea or vomiting Anesthetic complications: no   No notable events documented.  Last Vitals:  Vitals:   07/16/23 0058 07/16/23 0451  BP: (!) 123/57 (!) 147/70  Pulse: 74 91  Resp: 18 19  Temp: 36.4 C 36.6 C  SpO2: 99% 100%    Last Pain:  Vitals:   07/16/23 0350  TempSrc:   PainSc: 5                  Epifanio Lamar BRAVO

## 2023-07-16 NOTE — Progress Notes (Addendum)
 [facility] Progress Note   Subjective:   Seen in room - s/p HD yesterday followed by his cervical surgery/fusion. No pain today, has wound vac in place. No CP/dyspnea. For HD tomorrow.  Objective Vitals:   07/16/23 0045 07/16/23 0058 07/16/23 0451 07/16/23 0926  BP: (!) 117/57 (!) 123/57 (!) 147/70 119/60  Pulse: 75 74 91 99  Resp: 15 18 19 16   Temp: 97.9 F (36.6 C) 97.6 F (36.4 C) 97.8 F (36.6 C) 98.1 F (36.7 C)  TempSrc:    Oral  SpO2: 100% 99% 100% 98%  Weight:      Height:       Physical Exam General: Blind, NAD. C-collar in place; wound vac/drain in place. Heart: RRR, 3/6 murmur Lungs: CTA anteriorly Abdomen: soft Extremities: no LE edema Dialysis Access: R [facility]  Additional Objective Labs: Basic Metabolic Panel: Recent Labs  Lab 07/09/23 1920 07/11/23 0455 07/13/23 0527 07/15/23 0056 07/15/23 1838 07/15/23 2142 07/16/23 0001 07/16/23 0412  NA 135   < > 133* 127* 133* 138 135 133*  K 3.3*   < > 3.8 3.8 3.7 3.0* 4.1 4.7  CL 97*   < > 96* 90* 97*  --   --  97*  CO2 24   < > 25 23  --   --   --  21*  GLUCOSE 97   < > 206* 121* 110*  --   --  171*  BUN 23   < > 18 31* 15  --   --  14  CREATININE 6.82*   < > 4.83* 7.23* 4.70*  --   --  4.75*  CALCIUM  8.6*   < > 8.5* 8.9  --   --   --  9.1  PHOS 3.9  --   --  3.2  --   --   --   --    < > = values in this interval not displayed.   Liver Function Tests: Recent Labs  Lab 07/09/23 1920 07/15/23 0056  ALBUMIN  2.7* 2.9*   CBC: Recent Labs  Lab 07/09/23 1920 07/09/23 1920 07/13/23 0527 07/14/23 1224 07/15/23 0056 07/15/23 1838 07/15/23 2142 07/16/23 0001 07/16/23 0412  WBC 5.6  --  6.3 6.0 7.0  --   --   --  17.0*  NEUTROABS  --    < > 4.1 3.8 4.2  --   --   --  16.1*  HGB 9.7*  --  9.9* 11.0* 11.6*   < > 8.8* 10.5* 11.1*  HCT 30.9*  --  31.5* 34.9* 37.0*   < > 26.0* 31.0* 35.1*  MCV 84.4  --  84.0 82.5 83.3  --   --   --  84.0  PLT 340  --  313 313 142*  --   --   --  316   <  > = values in this interval not displayed.   Studies/Results: DG Cervical Spine 2 or 3 views Result Date: 07/15/2023 CLINICAL DATA:  C6-C7 corpectomy and posterior cervical fusion C5-T1 EXAM: CERVICAL SPINE - 2-3 VIEW FLUOROSCOPY TIME:  Fluoroscopy Time:  26.1 seconds Radiation Exposure Index (if provided by the fluoroscopic device): 7.7 mGy Number of Acquired Spot Images: 7 COMPARISON:  MRI 07/09/2023 FINDINGS: Multiple intraoperative fluoroscopic spot images are provided without a radiologist present. IMPRESSION: Intraoperative fluoroscopic images during C6-C7 corpectomy and posterior cervical fusion C5-T1. See operative report for details. Electronically Signed   By: Norman Gatlin M.D.   On: 07/15/2023 23:18  DG C-Arm 1-60 Min-No Report Result Date: 07/15/2023 Fluoroscopy was utilized by the requesting physician.  No radiographic interpretation.   Medications:  sodium chloride      sodium chloride      sodium chloride      heparin  sodium (porcine)     vancomycin  (VANCOCIN ) 750 mg in sodium chloride  0.9 % 250 mL IVPB      (feeding supplement) PROSource Plus  30 mL Oral BID BM   amLODipine   5 mg Oral Daily   aspirin  EC  325 mg Oral QHS   atorvastatin   80 mg Oral Daily   brimonidine   1 drop Both Eyes BID   calcitRIOL   0.5 mcg Oral Q M,W,F   Chlorhexidine  Gluconate Cloth  6 each Topical Q0600   dorzolamide -timolol   1 drop Both Eyes BID   heparin   5,000 Units Subcutaneous Q8H   insulin  aspart  0-9 Units Subcutaneous TID WC   insulin  glargine-yfgn  2 Units Subcutaneous QHS   lanthanum   1,000 mg Oral TID WC   latanoprost   1 drop Both Eyes QHS   lisinopril   20 mg Oral BID   sodium chloride  flush  3 mL Intravenous Q12H    Dialysis Orders MWF - [facility] 3:30hr, 400/A1.5, EDW 69.9kg, 2K/2.5Ca, TDC, heparin  6500 unit bolus + 2900 unit mid-run bolus - Mircera 120mcg IV q 2 weeks - last 12/22 - Calcitriol  0.5mcg PO q HD   Assessment/Plan: Cervical discitis/osteomyelitis: Blood Cx Staph Epi -  on Vanc. Cervical spine stenosis/compression: Neurosurgery following, s/p fusion 1/6.  ESRD: Usual MWF schedule - next HD tomorrow. HTN/volume: BP controlled, UF as tolerated. He is below prior EDW (will lower on d/c). Anemia of ESRD: Hgb 11.1 - not ESA for now. Secondary HPTH: Ca/Phos fine - continue VDRA, no binder for now. Nutrition: Alb low, continue supplements. T2DM (with retinopathy/blindness)   Addendm: Per ID - will need vanc 750mcg IV q HD until 08/26/23. [place]   Izetta Boehringer, PA-C 07/16/2023, 11:31 AM  [employer]

## 2023-07-16 NOTE — Evaluation (Signed)
 Occupational Therapy Evaluation Patient Details Name: Shawn [name] MRN: [id number] DOB: October 24, 1960 Today's Date: 07/16/2023   History of Present Illness Pt is 63 year old presented to [facility] on  07/08/24 for neck pain and tingling in rt hand. 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 Pt with RUE weakness. Pt placed in cervical collar and scheduled for cervical fusion on 1/6; s/p cervical fusion and corpectomy 1/6, brace when OOB; PMH - CVA, DM, HTN, ESRD on HD, blind. (Simultaneous filing. User may not have seen previous data.)   Clinical Impression   Pt admitted for the above diagnosis and has the deficits outlined below. Pt would benefit from cont OT to increase independence with basic adls back to his baseline level of functioning. At baseline, pt is blind but completes all basic adls in his apartment with supervision and requires some assist when he leaves. Pt  is very weak this morning and requires overall mod to max assist with most LE adls. Pt is limited by pain and weakness. This pt will need post acute rehab and could tolerate more than 3 hours of a therapy a day to reach his baseline.  Will continue to see with focus on mobilization during adls and reaching mod I level of care. Pt's wife will be available at all times.       If plan is discharge home, recommend the following: Two people to help with walking and/or transfers;A lot of help with bathing/dressing/bathroom;Assistance with cooking/housework;Assistance with feeding;Assist for transportation;Help with stairs or ramp for entrance    Functional Status Assessment  Patient has had a recent decline in their functional status and demonstrates the ability to make significant improvements in function in a reasonable and predictable amount of time.  Equipment Recommendations  Other (comment) (tbd)    Recommendations for Other Services Rehab consult     Precautions /  Restrictions Precautions Precautions: Fall (Simultaneous filing. User may not have seen previous data.) Precaution Comments: pt blind; functional in own enviroment (Simultaneous filing. User may not have seen previous data.) Restrictions Weight Bearing Restrictions Per Provider Order: No      Mobility Bed Mobility Overal bed mobility: Needs Assistance (Simultaneous filing. User may not have seen previous data.)                  Transfers Overall transfer level: Needs assistance Equipment used: Rolling walker (2 wheels) Transfers: Sit to/from Stand, Bed to chair/wheelchair/BSC Sit to Stand: Mod assist, From elevated surface     Step pivot transfers: Mod assist, From elevated surface     General transfer comment: Pt required cues for hand placement and cues to bring calves off back of bed to pivot to chair.  Pt reports feeling unsteady on his feet but states this was his first time up to chair since surgery (although chair was prepared when therapist walked in) (Simultaneous filing. User may not have seen previous data.)      Balance Overall balance assessment: Needs assistance (Simultaneous filing. User may not have seen previous data.)         Standing balance support: Bilateral upper extremity supported, Reliant on assistive device for balance Standing balance-Leahy Scale: Poor Standing balance comment: pt with posterior lean pressing calves up against bed in standing to start                           ADL either performed or assessed with  clinical judgement   ADL Overall ADL's : Needs assistance/impaired Eating/Feeding: Minimal assistance;Sitting Eating/Feeding Details (indicate cue type and reason): Pt is independent feeding at home after set up.  Feel pt will have greater ease feeding self up in chair. Grooming: Wash/dry hands;Wash/dry face;Oral care;Minimal assistance;Sitting Grooming Details (indicate cue type and reason): assist more for vision and  less for fine motor assist. Upper Body Bathing: Set up;Sitting   Lower Body Bathing: Moderate assistance;Sit to/from stand;Cueing for compensatory techniques Lower Body Bathing Details (indicate cue type and reason): pt limited by pain and inability to access B LEs at this time. Upper Body Dressing : Moderate assistance;Sitting Upper Body Dressing Details (indicate cue type and reason): Pt with pain and unable to get shirt over head or around back to dress UE without assist. Lower Body Dressing: Maximal assistance;Sit to/from stand;Cueing for compensatory techniques Lower Body Dressing Details (indicate cue type and reason): Pain limiting pt from accessing LEs easily at this time. Pt normally crosses legs over but this is painful due to pulling legs up with BUEs. Toilet Transfer: Moderate assistance;Stand-pivot;BSC/3in1;Rolling walker (2 wheels)   Toileting- Clothing Manipulation and Hygiene: Moderate assistance;Sit to/from stand;Cueing for compensatory techniques       Functional mobility during ADLs: Moderate assistance;Rolling walker (2 wheels) General ADL Comments: Pt signficantly limited by pain and unsteadiness on feet during all mobility.     Vision Baseline Vision/History: 2 Legally blind Ability to See in Adequate Light: 4 Severely impaired Patient Visual Report: No change from baseline Additional Comments: Pt has been blind for years due to diabetes.     Perception         Praxis Praxis: WFL       Pertinent Vitals/Pain Pain Assessment Pain Assessment: Faces (Simultaneous filing. User may not have seen previous data.) Faces Pain Scale: Hurts whole lot Pain Intervention(s): Monitored during session, Patient requesting pain meds-RN notified, Limited activity within patient's tolerance, Repositioned     Extremity/Trunk Assessment Upper Extremity Assessment Upper Extremity Assessment: Right hand dominant;RUE deficits/detail (Simultaneous filing. User may not have seen  previous data.) RUE Deficits / Details: AROM WFL. Strength: shoulder 4/5, biceps 5/5, triceps 3+/5, grip 4+/5 RUE Sensation: WNL RUE Coordination: decreased fine motor;decreased gross motor   Lower Extremity Assessment Lower Extremity Assessment: Defer to PT evaluation (Simultaneous filing. User may not have seen previous data.)   Cervical / Trunk Assessment Cervical / Trunk Assessment: Neck Surgery (Simultaneous filing. User may not have seen previous data.)   Communication Communication Communication: No apparent difficulties (Simultaneous filing. User may not have seen previous data.)   Cognition Arousal: Alert (Simultaneous filing. User may not have seen previous data.) Behavior During Therapy: [facility] for tasks assessed/performed (Simultaneous filing. User may not have seen previous data.) Overall Cognitive Status: Within Functional Limits for tasks assessed (Simultaneous filing. User may not have seen previous data.)                                 General Comments: Pt manages in apartment well with vision loss. Requires assist outside apartment.     General Comments  Wife, Apolinar, present and supportive    Exercises     Shoulder Instructions      Home Living Family/patient expects to be discharged to:: Private residence (Simultaneous filing. User may not have seen previous data.) Living Arrangements: Spouse/significant other (Simultaneous filing. User may not have seen previous data.) Available Help at Discharge: Family;Available 24 hours/day (  Simultaneous filing. User may not have seen previous data.) Type of Home: Apartment (Simultaneous filing. User may not have seen previous data.) Home Access: Level entry (Simultaneous filing. User may not have seen previous data.)     Home Layout: One level (Simultaneous filing. User may not have seen previous data.)     Bathroom Shower/Tub: Walk-in shower;Other (comment);Sponge bathes at baseline (has had HD catheter in  R shoulder since 2016 and states he no longer gets in shower.  Simultaneous filing. User may not have seen previous data.)   Bathroom Toilet: Standard (Simultaneous filing. User may not have seen previous data.) Bathroom Accessibility: Yes How Accessible: Accessible via walker Home Equipment: BSC/3in1;Cane - single point;Rollator (4 wheels);Shower seat - built in [employer]. User may not have seen previous data.)          Prior Functioning/Environment Prior Level of Function : Needs assist (Simultaneous filing. User may not have seen previous data.)       Physical Assist : Mobility (physical);ADLs (physical) Mobility (physical): Stairs (uses cane inside and rolling walker outside) ADLs (physical): IADLs Mobility Comments: SPC in the house, RW in the community and for dialysis (Simultaneous filing. User may not have seen previous data.) ADLs Comments: Reports Indep, Wife does all IADLs for pt. (Simultaneous filing. User may not have seen previous data.)        OT Problem List: Decreased strength;Decreased activity tolerance;Impaired balance (sitting and/or standing);Impaired vision/perception;Decreased coordination;Decreased knowledge of use of DME or AE;Decreased knowledge of precautions;Impaired UE functional use;Pain      OT Treatment/Interventions: Self-care/ADL training;Therapeutic exercise;DME and/or AE instruction;Therapeutic activities;Balance training    OT Goals(Current goals can be found in the care plan section) Acute Rehab OT Goals Patient Stated Goal: to get stronger and have less pain. OT Goal Formulation: With patient Time For Goal Achievement: 07/30/23 Potential to Achieve Goals: Good ADL Goals Pt Will Perform Grooming: with contact guard assist;standing Pt Will Perform Upper Body Bathing: with set-up;sitting Pt Will Perform Lower Body Bathing: with contact guard assist;sit to/from stand Pt Will Perform Upper Body Dressing: with set-up;sitting Pt Will  Perform Lower Body Dressing: with contact guard assist;sit to/from stand Pt Will Transfer to Toilet: with supervision;ambulating;regular height toilet Pt Will Perform Toileting - Clothing Manipulation and hygiene: with contact guard assist;sit to/from stand  OT Frequency: Min 1X/week    Co-evaluation              AM-PAC OT 6 Clicks Daily Activity     Outcome Measure Help from another person eating meals?: A Little Help from another person taking care of personal grooming?: A Little Help from another person toileting, which includes using toliet, bedpan, or urinal?: A Lot Help from another person bathing (including washing, rinsing, drying)?: A Lot Help from another person to put on and taking off regular upper body clothing?: A Little Help from another person to put on and taking off regular lower body clothing?: A Lot 6 Click Score: 15   End of Session Equipment Utilized During Treatment: Rolling walker (2 wheels) Nurse Communication: Mobility status;Other (comment) (pt stated he would sit up for 30 min but hoped to get help in 30 minutes to get back into bed.  Nursing aware.)  Activity Tolerance: Patient limited by pain Patient left: in chair;with call bell/phone within reach;with chair alarm set  OT Visit Diagnosis: Unsteadiness on feet (R26.81)                Time: 8642-8574 OT Time Calculation (min): 28 min  Charges:  OT General Charges $OT Visit: 1 Visit OT Evaluation $OT Eval Moderate Complexity: 1 Mod OT Treatments $Self Care/Home Management : 8-22 mins  Joshua Silvano Dragon 07/16/2023, 2:51 PM

## 2023-07-16 NOTE — Progress Notes (Signed)

## 2023-07-16 NOTE — Progress Notes (Addendum)
 Subjective: No new complaints, patient feeling much better   Antibiotics:  Anti-infectives (From admission, onward)    Start     Dose/Rate Route Frequency Ordered Stop   07/16/23 0115  ceFAZolin  (ANCEF ) IVPB 1 g/50 mL premix  Status:  Discontinued        1 g 100 mL/hr over 30 Minutes Intravenous Every 8 hours 07/16/23 0115 07/16/23 0842   07/15/23 2305  vancomycin  (VANCOCIN ) powder  Status:  Discontinued          As needed 07/15/23 2305 07/15/23 2343   07/15/23 1200  vancomycin  (VANCOCIN ) 750 mg in sodium chloride  0.9 % 250 mL IVPB        750 mg 250 mL/hr over 60 Minutes Intravenous Every M-W-F (Hemodialysis) 07/14/23 0036     07/15/23 0600  ceFAZolin  (ANCEF ) IVPB 2g/100 mL premix        2 g 200 mL/hr over 30 Minutes Intravenous On call to O.R. 07/14/23 1927 07/15/23 1926   07/14/23 2030  ceFAZolin  (ANCEF ) IVPB 2g/100 mL premix  Status:  Discontinued       Note to Pharmacy: TO IR   2 g 200 mL/hr over 30 Minutes Intravenous To Radiology 07/14/23 1933 07/14/23 1943   07/12/23 1830  ceFAZolin  (ANCEF ) IVPB 2g/100 mL premix        2 g 200 mL/hr over 30 Minutes Intravenous  Once 07/12/23 1742 07/12/23 1814   07/12/23 1800  vancomycin  (VANCOREADY) IVPB 750 mg/150 mL  Status:  Discontinued        750 mg 150 mL/hr over 60 Minutes Intravenous Every M-W-F (Hemodialysis) 07/12/23 0824 07/12/23 1015   07/12/23 1800  vancomycin  (VANCOCIN ) 750 mg in sodium chloride  0.9 % 250 mL IVPB  Status:  Discontinued       Note to Pharmacy: Indication: Osteomyelitis   750 mg 265 mL/hr over 60 Minutes Intravenous Every M-W-F (Hemodialysis) 07/12/23 1015 07/14/23 0032   07/10/23 1800  vancomycin  (VANCOREADY) IVPB 750 mg/150 mL  Status:  Discontinued        750 mg 150 mL/hr over 60 Minutes Intravenous Every M-W-F (Hemodialysis) 07/09/23 1518 07/12/23 0824   07/09/23 1545  vancomycin  (VANCOREADY) IVPB 1500 mg/300 mL        1,500 mg 150 mL/hr over 120 Minutes Intravenous Once 07/09/23 1531  07/09/23 1858   07/09/23 1530  ceFEPIme  (MAXIPIME ) 1 g in sodium chloride  0.9 % 100 mL IVPB  Status:  Discontinued        1 g 200 mL/hr over 30 Minutes Intravenous Every 24 hours 07/09/23 1515 07/11/23 0814   07/09/23 1515  Vancomycin  (VANCOCIN ) 1,500 mg in sodium chloride  0.9 % 500 mL IVPB  Status:  Discontinued        1,500 mg 250 mL/hr over 120 Minutes Intravenous  Once 07/09/23 1512 07/09/23 1531   07/09/23 1515  ceFEPIme  (MAXIPIME ) 2 g in sodium chloride  0.9 % 100 mL IVPB  Status:  Discontinued        2 g 200 mL/hr over 30 Minutes Intravenous  Once 07/09/23 1512 07/09/23 1515       Medications: Scheduled Meds:  (feeding supplement) PROSource Plus  30 mL Oral BID BM   amLODipine   5 mg Oral Daily   aspirin  EC  325 mg Oral QHS   atorvastatin   80 mg Oral Daily   brimonidine   1 drop Both Eyes BID   calcitRIOL   0.5 mcg Oral Q M,W,F   Chlorhexidine  Gluconate Cloth  6 each  Topical Q0600   dorzolamide -timolol   1 drop Both Eyes BID   heparin   5,000 Units Subcutaneous Q8H   insulin  aspart  0-9 Units Subcutaneous TID WC   insulin  glargine-yfgn  2 Units Subcutaneous QHS   lanthanum   1,000 mg Oral TID WC   latanoprost   1 drop Both Eyes QHS   lisinopril   20 mg Oral BID   sodium chloride  flush  3 mL Intravenous Q12H   Continuous Infusions:  sodium chloride      sodium chloride      sodium chloride      heparin  sodium (porcine)     vancomycin  (VANCOCIN ) 750 mg in sodium chloride  0.9 % 250 mL IVPB     PRN Meds:.acetaminophen  **OR** acetaminophen , cyclobenzaprine , heparin  sodium (porcine), HYDROcodone -acetaminophen , HYDROcodone -acetaminophen , HYDROmorphone  (DILAUDID ) injection, menthol -cetylpyridinium **OR** phenol, ondansetron  **OR** ondansetron  (ZOFRAN ) IV, senna-docusate, sodium chloride  flush    Objective: Weight change:   Intake/Output Summary (Last 24 hours) at 07/16/2023 1406 Last data filed at 07/16/2023 0900 Gross per 24 hour  Intake 2155 ml  Output 3650 ml  Net -1495 ml    Blood pressure 119/60, pulse 99, temperature 98.1 F (36.7 C), temperature source Oral, resp. rate 16, height 5' 11 (1.803 m), weight 66.3 kg, SpO2 98%. Temp:  [97.6 F (36.4 C)-98.7 F (37.1 C)] 98.1 F (36.7 C) (01/07 0926) Pulse Rate:  [68-99] 99 (01/07 0926) Resp:  [8-19] 16 (01/07 0926) BP: (117-164)/(38-78) 119/60 (01/07 0926) SpO2:  [94 %-100 %] 98 % (01/07 0926) Arterial Line BP: (144-154)/(48-88) 154/88 (01/07 0015) Weight:  [66.3 kg] 66.3 kg (01/06 1728)  Physical Exam: Physical Exam Constitutional:      Appearance: He is well-developed.  HENT:     Head: Normocephalic and atraumatic.  Eyes:     Conjunctiva/sclera: Conjunctivae normal.  Cardiovascular:     Rate and Rhythm: Normal rate and regular rhythm.  Pulmonary:     Effort: Pulmonary effort is normal. No respiratory distress.     Breath sounds: No wheezing.  Abdominal:     General: There is no distension.     Palpations: Abdomen is soft.  Musculoskeletal:     Cervical back: Normal range of motion and neck supple.  Skin:    General: Skin is warm and dry.     Findings: No erythema or rash.  Neurological:     Mental Status: He is alert and oriented to person, place, and time.  Psychiatric:        Mood and Affect: Mood normal.        Behavior: Behavior normal.        Thought Content: Thought content normal.        Judgment: Judgment normal.      CBC:    BMET Recent Labs    07/15/23 0056 07/15/23 1838 07/15/23 2142 07/16/23 0001 07/16/23 0412  NA 127* 133*   < > 135 133*  K 3.8 3.7   < > 4.1 4.7  CL 90* 97*  --   --  97*  CO2 23  --   --   --  21*  GLUCOSE 121* 110*  --   --  171*  BUN 31* 15  --   --  14  CREATININE 7.23* 4.70*  --   --  4.75*  CALCIUM  8.9  --   --   --  9.1   < > = values in this interval not displayed.     Liver Panel  Recent Labs    07/15/23 0056  ALBUMIN  2.9*  Sedimentation Rate No results for input(s): ESRSEDRATE in the last 72  hours. C-Reactive Protein No results for input(s): CRP in the last 72 hours.  Micro Results: Recent Results (from the past 720 hours)  Culture, blood (Routine X 2) w Reflex to ID Panel     Status: Abnormal   Collection Time: 07/09/23  3:23 PM   Specimen: BLOOD  Result Value Ref Range Status   Specimen Description BLOOD SITE NOT SPECIFIED  Final   Special Requests   Final    BOTTLES DRAWN AEROBIC AND ANAEROBIC Blood Culture adequate volume   Culture  Setup Time   Final    GRAM POSITIVE COCCI IN CLUSTERS ANAEROBIC BOTTLE ONLY CRITICAL VALUE NOTED.  VALUE IS CONSISTENT WITH PREVIOUSLY REPORTED AND CALLED VALUE. Performed at [facility], [address], [place], [place] [address]    Culture STAPHYLOCOCCUS EPIDERMIDIS (A)  Final   Report Status 07/13/2023 FINAL  Final   Organism ID, Bacteria STAPHYLOCOCCUS EPIDERMIDIS  Final      Susceptibility   Staphylococcus epidermidis - MIC*    CIPROFLOXACIN <=0.5 SENSITIVE Sensitive     ERYTHROMYCIN  >=8 RESISTANT Resistant     GENTAMICIN <=0.5 SENSITIVE Sensitive     OXACILLIN >=4 RESISTANT Resistant     TETRACYCLINE >=16 RESISTANT Resistant     VANCOMYCIN  1 SENSITIVE Sensitive     TRIMETH /SULFA  <=10 SENSITIVE Sensitive     CLINDAMYCIN <=0.25 SENSITIVE Sensitive     RIFAMPIN <=0.5 SENSITIVE Sensitive     Inducible Clindamycin NEGATIVE Sensitive     * STAPHYLOCOCCUS EPIDERMIDIS  Culture, blood (Routine X 2) w Reflex to ID Panel     Status: Abnormal   Collection Time: 07/09/23  3:39 PM   Specimen: BLOOD  Result Value Ref Range Status   Specimen Description BLOOD SITE NOT SPECIFIED  Final   Special Requests   Final    BOTTLES DRAWN AEROBIC AND ANAEROBIC Blood Culture adequate volume   Culture  Setup Time   Final    GRAM POSITIVE COCCI IN CLUSTERS IN BOTH AEROBIC AND ANAEROBIC BOTTLES CRITICAL RESULT CALLED TO, READ BACK BY AND VERIFIED WITH: PHARMD JENNY ZHOU [phone], ADC CRITICAL VALUE NOTED.  VALUE IS CONSISTENT WITH  PREVIOUSLY REPORTED AND CALLED VALUE. Performed at [facility], [address], [place], [place] [address]    Culture STAPHYLOCOCCUS EPIDERMIDIS (A)  Final   Report Status 07/12/2023 FINAL  Final   Organism ID, Bacteria STAPHYLOCOCCUS EPIDERMIDIS  Final      Susceptibility   Staphylococcus epidermidis - MIC*    CIPROFLOXACIN <=0.5 SENSITIVE Sensitive     ERYTHROMYCIN  >=8 RESISTANT Resistant     GENTAMICIN <=0.5 SENSITIVE Sensitive     OXACILLIN >=4 RESISTANT Resistant     TETRACYCLINE >=16 RESISTANT Resistant     VANCOMYCIN  2 SENSITIVE Sensitive     TRIMETH /SULFA  <=10 SENSITIVE Sensitive     CLINDAMYCIN <=0.25 SENSITIVE Sensitive     RIFAMPIN <=0.5 SENSITIVE Sensitive     Inducible Clindamycin NEGATIVE Sensitive     * STAPHYLOCOCCUS EPIDERMIDIS  Blood Culture ID Panel (Reflexed)     Status: Abnormal   Collection Time: 07/09/23  3:39 PM  Result Value Ref Range Status   Enterococcus faecalis NOT DETECTED NOT DETECTED Final   Enterococcus Faecium NOT DETECTED NOT DETECTED Final   Listeria monocytogenes NOT DETECTED NOT DETECTED Final   Staphylococcus species DETECTED (A) NOT DETECTED Final    Comment: CRITICAL RESULT CALLED TO, READ BACK BY AND VERIFIED WITH: PHARMD  JENNY ZHOU [phone], ADC    Staphylococcus aureus (BCID) NOT DETECTED NOT DETECTED Final   Staphylococcus epidermidis DETECTED (A) NOT DETECTED Final    Comment: Methicillin (oxacillin) resistant coagulase negative staphylococcus. Possible blood culture contaminant (unless isolated from more than one blood culture draw or clinical case suggests pathogenicity). No antibiotic treatment is indicated for blood  culture contaminants. CRITICAL RESULT CALLED TO, READ BACK BY AND VERIFIED WITH: PHARMD JENNY ZHOU [phone], ADC    Staphylococcus lugdunensis NOT DETECTED NOT DETECTED Final   Streptococcus species NOT DETECTED NOT DETECTED Final   Streptococcus agalactiae NOT DETECTED NOT DETECTED Final    Streptococcus pneumoniae NOT DETECTED NOT DETECTED Final   Streptococcus pyogenes NOT DETECTED NOT DETECTED Final   A.calcoaceticus-baumannii NOT DETECTED NOT DETECTED Final   Bacteroides fragilis NOT DETECTED NOT DETECTED Final   Enterobacterales NOT DETECTED NOT DETECTED Final   Enterobacter cloacae complex NOT DETECTED NOT DETECTED Final   Escherichia coli NOT DETECTED NOT DETECTED Final   Klebsiella aerogenes NOT DETECTED NOT DETECTED Final   Klebsiella oxytoca NOT DETECTED NOT DETECTED Final   Klebsiella pneumoniae NOT DETECTED NOT DETECTED Final   Proteus species NOT DETECTED NOT DETECTED Final   Salmonella species NOT DETECTED NOT DETECTED Final   Serratia marcescens NOT DETECTED NOT DETECTED Final   Haemophilus influenzae NOT DETECTED NOT DETECTED Final   Neisseria meningitidis NOT DETECTED NOT DETECTED Final   Pseudomonas aeruginosa NOT DETECTED NOT DETECTED Final   Stenotrophomonas maltophilia NOT DETECTED NOT DETECTED Final   Candida albicans NOT DETECTED NOT DETECTED Final   Candida auris NOT DETECTED NOT DETECTED Final   Candida glabrata NOT DETECTED NOT DETECTED Final   Candida krusei NOT DETECTED NOT DETECTED Final   Candida parapsilosis NOT DETECTED NOT DETECTED Final   Candida tropicalis NOT DETECTED NOT DETECTED Final   Cryptococcus neoformans/gattii NOT DETECTED NOT DETECTED Final   Methicillin resistance mecA/C DETECTED (A) NOT DETECTED Final    Comment: CRITICAL RESULT CALLED TO, READ BACK BY AND VERIFIED WITH: PHARMD JENNY ZHOU [phone], ADC Performed at [facility], [address], [place], [place] [address]   MRSA Next Gen by PCR, Nasal     Status: None   Collection Time: 07/09/23  6:42 PM   Specimen: Nasal Mucosa; Nasal Swab  Result Value Ref Range Status   MRSA by PCR Next Gen NOT DETECTED NOT DETECTED Final    Comment: (NOTE) The GeneXpert MRSA Assay (FDA approved for NASAL specimens only), is one component of a comprehensive MRSA colonization  surveillance program. It is not intended to diagnose MRSA infection nor to guide or monitor treatment for MRSA infections. Test performance is not FDA approved in patients less than 76 years old. Performed at [facility], [address] [place], [place] [address]   Culture, blood (Routine X 2) w Reflex to ID Panel     Status: None   Collection Time: 07/11/23  3:47 PM   Specimen: BLOOD RIGHT ARM  Result Value Ref Range Status   Specimen Description BLOOD RIGHT ARM  Final   Special Requests   Final    BOTTLES DRAWN AEROBIC ONLY Blood Culture results may not be optimal due to an inadequate volume of blood received in culture bottles   Culture   Final    NO GROWTH 5 DAYS Performed at [facility], [address], [place], [place] [address]    Report Status 07/16/2023 FINAL  Final  Culture, blood (Routine  X 2) w Reflex to ID Panel     Status: None   Collection Time: 07/11/23  3:49 PM   Specimen: BLOOD RIGHT ARM  Result Value Ref Range Status   Specimen Description BLOOD RIGHT ARM  Final   Special Requests   Final    BOTTLES DRAWN AEROBIC ONLY Blood Culture results may not be optimal due to an inadequate volume of blood received in culture bottles   Culture   Final    NO GROWTH 5 DAYS Performed at [facility], [address], [place], [place] [address]    Report Status 07/16/2023 FINAL  Final  Surgical pcr screen     Status: None   Collection Time: 07/12/23  4:46 AM   Specimen: Nasal Mucosa; Nasal Swab  Result Value Ref Range Status   MRSA, PCR NEGATIVE NEGATIVE Final   Staphylococcus aureus NEGATIVE NEGATIVE Final    Comment: (NOTE) The Xpert SA Assay (FDA approved for NASAL specimens in patients 52 years of age and older), is one component of a comprehensive surveillance program. It is not intended to diagnose infection nor to guide or monitor treatment. Performed at [facility], [address], [place], [place] [address]     Studies/Results: DG  Cervical Spine 2 or 3 views Result Date: 07/15/2023 CLINICAL DATA:  C6-C7 corpectomy and posterior cervical fusion C5-T1 EXAM: CERVICAL SPINE - 2-3 VIEW FLUOROSCOPY TIME:  Fluoroscopy Time:  26.1 seconds Radiation Exposure Index (if provided by the fluoroscopic device): 7.7 mGy Number of Acquired Spot Images: 7 COMPARISON:  MRI 07/09/2023 FINDINGS: Multiple intraoperative fluoroscopic spot images are provided without a radiologist present. IMPRESSION: Intraoperative fluoroscopic images during C6-C7 corpectomy and posterior cervical fusion C5-T1. See operative report for details. Electronically Signed   By: Norman Gatlin M.D.   On: 07/15/2023 23:18   DG C-Arm 1-60 Min-No Report Result Date: 07/15/2023 Fluoroscopy was utilized by the requesting physician.  No radiographic interpretation.      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  He is now status post neurosurgery.  I would plan on giving him 6 weeks of vancomycin  with hemodialysis with day 1 being postoperative day 1.  Will follow this this with chronic oral antibiotics (doxycyline) for 2 reasons #1 we never got a true HD catheter but more importantly also had need of hardware placement into his spine that not with overt purulence was regardless a source of infection   #2  C6-C7 discitis  osteomyelitis with severe spinal canal stenosis:  He is now status post C6 and C7 anterior cervical corpectomy microdissection with C5-T1 anterior cervical fusion using interbody cage harvested autograft and morselized allograft as well as C5-T1 anterior plate fixation and C5-T1 posterior cervical fusion by Dr. Malcolm.  Dr. Sisto operative note indicates that there is no active overt purulence identified but chronic granulomatous findings.  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  We will plan on giving him 6 weeks of vancomycin  with hemodialysis followed by chronic doxycycline  potentially lifelong  #3 therapeutic drug monitoring: He is on hemodialysis so there is no need for metabolic checks in terms of screening for nephrotoxicity. Ideally he should have a weekly CBC  To track his sed rate and CRP.   #4  Screening for viral hepatitides we will go ahead and screen for hepatitis C is hepatitis B surface antigen negative and has good antibodies to hepatitis B   Shawn [name] has an appointment on 08/06/2023 at 1015 AM with Dr. Fleeta Rothman  at  [facility] for Infectious Disease, which  is located in the [facility] at  [address] in [place].  [address], which is located to the left of the elevators.  Phone: [phone]  Fax: [phone]  [email or web address]  The patient should arrive 30 minutes prior to their appoitment.    LOS: 7 days   [name] 07/16/2023, 2:06 PM

## 2023-07-16 NOTE — Hospital Course (Signed)
 Procedure Name: C6 and C7 anterior cervical corpectomy, microdissection C5-T1 anterior cervical fusion utilizing interbody cage, local harvested autograft, and morselized allograft C5-T1 anterior plate fixation R4-U8 posterior cervical fusion utilizing segmental lateral mass and pedicle screw instrumentation with morselized allograft

## 2023-07-16 NOTE — Plan of Care (Signed)
    Problem: Coping: Goal: Ability to adjust to condition or change in health will improve Outcome: Progressing   Problem: Fluid Volume: Goal: Ability to maintain a balanced intake and output will improve Outcome: Progressing   Problem: Health Behavior/Discharge Planning: Goal: Ability to identify and utilize available resources and services will improve Outcome: Progressing Goal: Ability to manage health-related needs will improve Outcome: Progressing   Problem: Metabolic: Goal: Ability to maintain appropriate glucose levels will improve Outcome: Progressing   Problem: Nutritional: Goal: Maintenance of adequate nutrition will improve Outcome: Progressing Goal: Progress toward achieving an optimal weight will improve Outcome: Progressing   Problem: Skin Integrity: Goal: Risk for impaired skin integrity will decrease Outcome: Progressing   Problem: Tissue Perfusion: Goal: Adequacy of tissue perfusion will improve Outcome: Progressing   Problem: Education: Goal: Knowledge of General Education information will improve Description: Including pain rating scale, medication(s)/side effects and non-pharmacologic comfort measures Outcome: Progressing   Problem: Health Behavior/Discharge Planning: Goal: Ability to manage health-related needs will improve Outcome: Progressing   Problem: Clinical Measurements: Goal: Ability to maintain clinical measurements within normal limits will improve Outcome: Progressing Goal: Will remain free from infection Outcome: Progressing Goal: Diagnostic test results will improve Outcome: Progressing Goal: Respiratory complications will improve Outcome: Progressing Goal: Cardiovascular complication will be avoided Outcome: Progressing   Problem: Activity: Goal: Risk for activity intolerance will decrease Outcome: Progressing   Problem: Nutrition: Goal: Adequate nutrition will be maintained Outcome: Progressing   Problem: Coping: Goal:  Level of anxiety will decrease Outcome: Progressing   Problem: Elimination: Goal: Will not experience complications related to bowel motility Outcome: Progressing Goal: Will not experience complications related to urinary retention Outcome: Progressing   Problem: Pain Management: Goal: General experience of comfort will improve Outcome: Progressing   Problem: Safety: Goal: Ability to remain free from injury will improve Outcome: Progressing   Problem: Skin Integrity: Goal: Risk for impaired skin integrity will decrease Outcome: Progressing

## 2023-07-16 NOTE — Progress Notes (Signed)
 Postop day 1.  Patient doing very well.  Minimal neck pain.  Preoperative right upper extremity numbness and weakness much improved.  Afebrile.  Vital signs are stable.  Awake and alert.  Oriented and appropriate.  Motor examination with marked right sided motor improvement now near normal with just some trace right-sided triceps weakness.  Wounds healing well.  Drain output minimal.  Doing very well following anterior posterior decompression and fusion for treatment of cervical osteomyelitis discitis with pathologic fracture.  Mobilize with therapy.  Continue IV antibiotics per dialysis.  Okay to begin working towards discharge plan from my standpoint.

## 2023-07-17 DIAGNOSIS — M4622 Osteomyelitis of vertebra, cervical region: Secondary | ICD-10-CM | POA: Diagnosis not present

## 2023-07-17 LAB — CBC WITH DIFFERENTIAL/PLATELET
Abs Immature Granulocytes: 0.05 10*3/uL (ref 0.00–0.07)
Basophils Absolute: 0 10*3/uL (ref 0.0–0.1)
Basophils Relative: 0 %
Eosinophils Absolute: 0.2 10*3/uL (ref 0.0–0.5)
Eosinophils Relative: 3 %
HCT: 28.9 % — ABNORMAL LOW (ref 39.0–52.0)
Hemoglobin: 9.2 g/dL — ABNORMAL LOW (ref 13.0–17.0)
Immature Granulocytes: 1 %
Lymphocytes Relative: 10 %
Lymphs Abs: 0.9 10*3/uL (ref 0.7–4.0)
MCH: 26.7 pg (ref 26.0–34.0)
MCHC: 31.8 g/dL (ref 30.0–36.0)
MCV: 83.8 fL (ref 80.0–100.0)
Monocytes Absolute: 1.3 10*3/uL — ABNORMAL HIGH (ref 0.1–1.0)
Monocytes Relative: 14 %
Neutro Abs: 6.8 10*3/uL (ref 1.7–7.7)
Neutrophils Relative %: 72 %
Platelets: 341 10*3/uL (ref 150–400)
RBC: 3.45 MIL/uL — ABNORMAL LOW (ref 4.22–5.81)
RDW: 16.2 % — ABNORMAL HIGH (ref 11.5–15.5)
WBC: 9.3 10*3/uL (ref 4.0–10.5)
nRBC: 0 % (ref 0.0–0.2)

## 2023-07-17 LAB — BASIC METABOLIC PANEL
Anion gap: 13 (ref 5–15)
BUN: 30 mg/dL — ABNORMAL HIGH (ref 8–23)
CO2: 24 mmol/L (ref 22–32)
Calcium: 8.9 mg/dL (ref 8.9–10.3)
Chloride: 93 mmol/L — ABNORMAL LOW (ref 98–111)
Creatinine, Ser: 6.36 mg/dL — ABNORMAL HIGH (ref 0.61–1.24)
GFR, Estimated: 9 mL/min — ABNORMAL LOW (ref >60)
Glucose, Bld: 258 mg/dL — ABNORMAL HIGH (ref 70–99)
Potassium: 4.4 mmol/L (ref 3.5–5.1)
Sodium: 130 mmol/L — ABNORMAL LOW (ref 135–145)

## 2023-07-17 LAB — GLUCOSE, CAPILLARY
Glucose-Capillary: 244 mg/dL — ABNORMAL HIGH (ref 70–99)
Glucose-Capillary: 130 mg/dL — ABNORMAL HIGH (ref 70–99)
Glucose-Capillary: 118 mg/dL — ABNORMAL HIGH (ref 70–99)
Glucose-Capillary: 125 mg/dL — ABNORMAL HIGH (ref 70–99)

## 2023-07-17 LAB — VANCOMYCIN, RANDOM: Vancomycin Rm: 25 ug/mL

## 2023-07-17 LAB — HEPATITIS A ANTIBODY, TOTAL: hep A Total Ab: NONREACTIVE

## 2023-07-17 MED ORDER — POLYETHYLENE GLYCOL 3350 17 G PO PACK
17.0000 g | PACK | Freq: Every day | ORAL | Status: DC
  Administered 2023-07-17 – 2023-07-18 (×2): 17 g via ORAL
  Filled 2023-07-17 (×3): qty 1

## 2023-07-17 MED ORDER — SENNOSIDES-DOCUSATE SODIUM 8.6-50 MG PO TABS
1.0000 | ORAL_TABLET | Freq: Two times a day (BID) | ORAL | Status: DC
Start: 2023-07-17 — End: 2023-07-18
  Administered 2023-07-17 – 2023-07-18 (×3): 1 via ORAL
  Filled 2023-07-17 (×4): qty 1

## 2023-07-17 MED ORDER — DARBEPOETIN ALFA 60 MCG/0.3ML IJ SOSY
60.0000 ug | PREFILLED_SYRINGE | INTRAMUSCULAR | Status: DC
  Administered 2023-07-17: 60 ug via SUBCUTANEOUS
  Filled 2023-07-17: qty 0.3

## 2023-07-17 MED ORDER — MELATONIN 5 MG PO TABS
5.0000 mg | ORAL_TABLET | Freq: Every day | ORAL | Status: DC
  Administered 2023-07-17: 5 mg via ORAL
  Filled 2023-07-17: qty 1

## 2023-07-17 NOTE — Progress Notes (Signed)
 PT Cancellation Note  Patient Details Name: Shawn [name] MRN: [id number] DOB: [birth year]   Cancelled Treatment:    Reason Eval/Treat Not Completed: Patient at procedure or test/unavailable  Late entry for earlier today;  Pt was at HD;   Will follow up later today as time allows;  Otherwise, will follow up for PT tomorrow;   Thank you,  Silvano Currier, PT  Acute Rehabilitation Services Office [phone]    Silvano VEAR Currier 07/17/2023, 1:54 PM

## 2023-07-17 NOTE — Progress Notes (Addendum)
 [facility] Progress Note   Subjective:   Patient seen in HD - 2L UFG, laying comfortably in bed. S/p cervical surgery. Denies significant pain/discomfort. Denies chest CP/dyspnea.   Objective Vitals:   07/17/23 0915 07/17/23 0930 07/17/23 0945 07/17/23 1000  BP: (!) 167/78 (!) 168/76 (!) 175/81 (!) 166/78  Pulse: 81 81 82 81  Resp: 15 15 10 11   Temp:      TempSrc:      SpO2: 98% 100% 99% 100%  Weight:      Height:       Physical Exam General: Blind, NAD. C-collar in place. Heart: RRR, 3/6 murmur, no R/G Lungs: CTA anteriorly Abdomen: soft, non-distended Extremities: no LE edema Dialysis Access: R [facility]  Additional Objective Labs: Basic Metabolic Panel: Recent Labs  Lab 07/15/23 0056 07/15/23 1838 07/15/23 2142 07/16/23 0001 07/16/23 0412 07/17/23 0432  NA 127* 133*   < > 135 133* 130*  K 3.8 3.7   < > 4.1 4.7 4.4  CL 90* 97*  --   --  97* 93*  CO2 23  --   --   --  21* 24  GLUCOSE 121* 110*  --   --  171* 258*  BUN 31* 15  --   --  14 30*  CREATININE 7.23* 4.70*  --   --  4.75* 6.36*  CALCIUM  8.9  --   --   --  9.1 8.9  PHOS 3.2  --   --   --   --   --    < > = values in this interval not displayed.   Liver Function Tests: Recent Labs  Lab 07/15/23 0056  ALBUMIN  2.9*   CBC: Recent Labs  Lab 07/13/23 0527 07/14/23 1224 07/15/23 0056 07/15/23 1838 07/16/23 0001 07/16/23 0412 07/17/23 0432  WBC 6.3 6.0 7.0  --   --  17.0* 9.3  NEUTROABS 4.1 3.8 4.2  --   --  16.1* 6.8  HGB 9.9* 11.0* 11.6*   < > 10.5* 11.1* 9.2*  HCT 31.5* 34.9* 37.0*   < > 31.0* 35.1* 28.9*  MCV 84.0 82.5 83.3  --   --  84.0 83.8  PLT 313 313 142*  --   --  316 341   < > = values in this interval not displayed.   Blood Culture    Component Value Date/Time   SDES BLOOD RIGHT ARM 07/11/2023 1549   SPECREQUEST  07/11/2023 1549    BOTTLES DRAWN AEROBIC ONLY Blood Culture results may not be optimal due to an inadequate volume of blood received in culture bottles    CULT  07/11/2023 1549    NO GROWTH 5 DAYS Performed at [facility], [address], [place], [place] [address]    REPTSTATUS 07/16/2023 FINAL 07/11/2023 1549    CBG: Recent Labs  Lab 07/16/23 0731 07/16/23 1138 07/16/23 1606 07/16/23 2014 07/17/23 0718  GLUCAP 151* 175* 241* 245* 244*   Studies/Results: DG Cervical Spine 2 or 3 views Result Date: 07/15/2023 CLINICAL DATA:  C6-C7 corpectomy and posterior cervical fusion C5-T1 EXAM: CERVICAL SPINE - 2-3 VIEW FLUOROSCOPY TIME:  Fluoroscopy Time:  26.1 seconds Radiation Exposure Index (if provided by the fluoroscopic device): 7.7 mGy Number of Acquired Spot Images: 7 COMPARISON:  MRI 07/09/2023 FINDINGS: Multiple intraoperative fluoroscopic spot images are provided without a radiologist present. IMPRESSION: Intraoperative fluoroscopic images during C6-C7 corpectomy and posterior cervical fusion C5-T1. See operative report for details. Electronically Signed   By: Norman  Stutzman M.D.   On: 07/15/2023 23:18   DG C-Arm 1-60 Min-No Report Result Date: 07/15/2023 Fluoroscopy was utilized by the requesting physician.  No radiographic interpretation.   Medications:  sodium chloride      sodium chloride      heparin  sodium (porcine)     vancomycin  (VANCOCIN ) 750 mg in sodium chloride  0.9 % 250 mL IVPB      (feeding supplement) PROSource Plus  30 mL Oral BID BM   amLODipine   5 mg Oral Daily   aspirin  EC  325 mg Oral QHS   atorvastatin   80 mg Oral Daily   brimonidine   1 drop Both Eyes BID   calcitRIOL   0.5 mcg Oral Q M,W,F   Chlorhexidine  Gluconate Cloth  6 each Topical Q0600   dorzolamide -timolol   1 drop Both Eyes BID   heparin   5,000 Units Subcutaneous Q8H   insulin  aspart  0-9 Units Subcutaneous TID WC   insulin  glargine-yfgn  2 Units Subcutaneous QHS   lanthanum   1,000 mg Oral TID WC   latanoprost   1 drop Both Eyes QHS   lisinopril   20 mg Oral BID   sodium chloride  flush  3 mL Intravenous Q12H    Dialysis Orders MWF -  [facility] 3:30hr, 400/A1.5, EDW 69.9kg, 2K/2.5Ca, TDC, heparin  6500 unit bolus + 2900 unit mid-run bolus - Mircera 120mcg IV q 2 weeks - last 12/22 - Calcitriol  0.5mcg PO q HD   Assessment/Plan: Cervical discitis/osteomyelitis: Blood Cx Staph Epi - on Vanc 750 mcg IV q HD until 08/26/23 per ID. Cervical spine stenosis/compression: Neurosurgery following, s/p fusion 1/6.  ESRD: Usual MWF schedule - HD today. HTN/volume: BP slightly elevated, UF as tolerated today. He is below prior EDW (will lower on d/c). Anemia of ESRD: Hgb 9.2 - restart ESA 60mcg Q Wednesday while admitted.  Secondary HPTH: Ca/Phos fine - continue VDRA, no binder for now. Nutrition: Alb low, continue supplements. T2DM (with retinopathy/blindness)   Signe Sick, PA-S Izetta Boehringer, PA-C 07/17/2023, 10:37 AM  [facility]

## 2023-07-17 NOTE — Progress Notes (Signed)
 Patient in hemodialysis this morning and unavailable for evaluation.

## 2023-07-17 NOTE — Progress Notes (Signed)
 Pharmacy Antibiotic Note  Shawn [name] is a 63 y.o. male admitted on 07/08/2023 with  MRSE discitis .  Pharmacy has been consulted for Vancomycin  dosing.  Patient is on HD MWF. Random Vancomycin  level at goal of 25. Continue current dose. End of treatment 08/26/23, nephrology to dose outpatient.  Plan: Vancomycin  750 mg every HD MWF  Continue through 08/26/23 per ID Levels as indicated clinically  Height: 5' 11 (180.3 cm) Weight: 68.7 kg (151 lb 7.3 oz) IBW/kg (Calculated) : 75.3  Temp (24hrs), Avg:98 F (36.7 C), Min:97.8 F (36.6 C), Max:98.2 F (36.8 C)  Recent Labs  Lab 07/13/23 0527 07/14/23 1224 07/15/23 0056 07/15/23 1838 07/16/23 0412 07/17/23 0432  WBC 6.3 6.0 7.0  --  17.0* 9.3  CREATININE 4.83*  --  7.23* 4.70* 4.75* 6.36*  VANCORANDOM  --   --   --   --   --  25    Estimated Creatinine Clearance: 11.7 mL/min (A) (by C-G formula based on SCr of 6.36 mg/dL (H)).    No Known Allergies  Dose adjustments this admission: N/a  Microbiology results: 12/31 BCx: 2/2 MRSE 12/31 BCx: [id number] 12/31 MRSA PCR: negative  Thank you for allowing pharmacy to be a part of this patient's care.  Con RAMAN Yeiren Whitecotton 07/17/2023 12:11 PM

## 2023-07-17 NOTE — Plan of Care (Signed)
  Problem: Bowel/Gastric: Goal: Gastrointestinal status for postoperative course will improve Outcome: Progressing   

## 2023-07-17 NOTE — PMR Pre-admission (Signed)
 PMR Admission Coordinator Pre-Admission Assessment  Patient: Shawn [name] is an 63 y.o., male MRN: [id number] DOB: November 01, 1960 Height: 5' 11 (180.3 cm) Weight: 65.9 kg  Insurance Information HMO:     PPO:      PCP:      IPA:      80/20:      OTHER:  PRIMARY: Medicare a and b      Policy#: [id number]      Subscriber: pt Benefits:  Phone #: passport one source online     Name: 1/8 Eff. Date: 04/08/2010     Deduct: $1676      Out of Pocket Max: none      Life Max: none CIR: 100%      SNF: 20 full days Outpatient: 80%     Co-Pay: 20% Home Health: 100%      Co-Pay: none DME: 80%     Co-Pay: 20% Providers: pt choice  SECONDARY: BCBS supplement      Policy#: [id number]     Phone#:   Financial Counselor:       Phone#:   The Data Processing Manager" for patients in Inpatient Rehabilitation Facilities with attached "Privacy Act Statement-Health Care Records" was provided and verbally reviewed with: Patient  Emergency Contact Information Contact Information     Name Relation Home Work Mobile   [place] Spouse [phone]  [phone]      Other Contacts     Name Relation Home Work [place] Sister [phone]      Current Medical History  Patient Admitting Diagnosis: Cervical discitis/osteomyelitis  History of Present Illness: 40 year ol male with history of ESRD , 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 No history of trauma.   MRI of the cervical spine showed possible discitis/osteomyelitis C6/C7, vertebral body height loss and retropulsion with severe, stenosis, compression of spinal cord. Neurosurgery consulted. Underwent ACDF for treatment of cervical discitis with pathological fracture on 07/15/23. ID consulted and placed on long term Vancomycin . End date of Vancomycin  08/26/23. Will be placed on chronic doxycycline   after IV antibiotic course. Postoperative pain management. Continue with cervical collar.  Nephrology consulted for on chronic hemodialysis. Continue insulin  regimen. On amlodipine  and Zestril  for HTN. On lipitor  and ASA for history of CVA. Complaints of constipation, does not urinate due to ESRD. Heparin  for DVT prophylaxis.  Patient's medical record from [facility] has been reviewed by the rehabilitation admission coordinator and physician.  Past Medical History  Past Medical History:  Diagnosis Date   Anemia    Arthritis    patient denies   Blind right eye    Complication of anesthesia    Diabetes mellitus    type II   ESRD on hemodialysis [facility])    Dialysis since 2011 MWF   HCAP (healthcare-associated pneumonia) 12/19/2022   Hypertension    Legally blind    No pertinent past medical history    PONV (postoperative nausea and vomiting)    N/V- became dehydrated had to have IV fluids- 01/2015   Shortness of breath dyspnea    when has too fluid   Stroke (HCC) 07/09/2010   date per patient   Syncope    Unspecified cerebral artery occlusion with cerebral infarction 04/02/2013   Has the patient had major surgery during 100 days prior to admission? Yes  Family History   family history includes Alzheimer's disease in his mother;  Cancer in his father and sister; Diabetes in his mother; Heart disease in his father; Stroke in his sister.  Current Medications  Current Facility-Administered Medications:    (feeding supplement) PROSource Plus liquid 30 mL, 30 mL, Oral, BID BM, Pool, Victory, MD, 30 mL at 07/17/23 1359   0.9 %  sodium chloride  infusion, 10 mL/hr, Intravenous, Once, Louis Victory, MD   0.9 %  sodium chloride  infusion, 10 mL/hr, Intravenous, Once, Louis Victory, MD   acetaminophen  (TYLENOL ) tablet 650 mg, 650 mg, Oral, Q6H PRN, 650 mg at 07/13/23 2207 **OR** acetaminophen  (TYLENOL ) suppository 650 mg, 650 mg, Rectal, Q6H PRN, Louis Victory, MD   amLODipine  (NORVASC ) tablet  5 mg, 5 mg, Oral, Daily, Pool, Victory, MD, 5 mg at 07/17/23 1359   aspirin  EC tablet 325 mg, 325 mg, Oral, QHS, Pool, Victory, MD, 325 mg at 07/16/23 2237   atorvastatin  (LIPITOR ) tablet 80 mg, 80 mg, Oral, Daily, Louis Victory, MD, 80 mg at 07/17/23 1359   brimonidine  (ALPHAGAN ) 0.2 % ophthalmic solution 1 drop, 1 drop, Both Eyes, BID, Pool, Victory, MD, 1 drop at 07/17/23 1358   calcitRIOL  (ROCALTROL ) capsule 0.5 mcg, 0.5 mcg, Oral, Q M,W,F, Pool, Victory, MD, 0.5 mcg at 07/17/23 1359   Chlorhexidine  Gluconate Cloth 2 % PADS 6 each, 6 each, Topical, Q0600, Louis Victory, MD, 6 each at 07/17/23 0547   cyclobenzaprine  (FLEXERIL ) tablet 10 mg, 10 mg, Oral, TID PRN, Louis Victory, MD, 10 mg at 07/16/23 0349   Darbepoetin Alfa  (ARANESP ) injection 60 mcg, 60 mcg, Subcutaneous, Q Wed-1800, Stovall, Kathryn R, PA-C   dorzolamide -timolol  (COSOPT ) 2-0.5 % ophthalmic solution 1 drop, 1 drop, Both Eyes, BID, Pool, Victory, MD, 1 drop at 07/17/23 1357   heparin  injection 5,000 Units, 5,000 Units, Subcutaneous, Q8H, Pool, Victory, MD, 5,000 Units at 07/17/23 1400   heparin  sodium (porcine) injection 3,800 Units, 3,800 Units, Intracatheter, Continuous PRN, Louis Victory, MD, 3,800 Units at 07/17/23 1220   HYDROcodone -acetaminophen  (NORCO) 10-325 MG per tablet 2 tablet, 2 tablet, Oral, Q4H PRN, Louis Victory, MD   HYDROcodone -acetaminophen  (NORCO/VICODIN) 5-325 MG per tablet 1 tablet, 1 tablet, Oral, Q4H PRN, Louis Victory, MD, 1 tablet at 07/16/23 0155   HYDROmorphone  (DILAUDID ) injection 0.5 mg, 0.5 mg, Intravenous, Q6H PRN, Louis Victory, MD, 0.5 mg at 07/15/23 0756   insulin  aspart (novoLOG ) injection 0-9 Units, 0-9 Units, Subcutaneous, TID WC, Louis Victory, MD, 1 Units at 07/17/23 1358   insulin  glargine-yfgn (SEMGLEE ) injection 2 Units, 2 Units, Subcutaneous, QHS, Pool, Victory, MD, 2 Units at 07/16/23 2237   lanthanum  (FOSRENOL ) chewable tablet 1,000 mg, 1,000 mg, Oral, TID WC, Louis Victory, MD, 1,000 mg at 07/17/23 1359   latanoprost   (XALATAN ) 0.005 % ophthalmic solution 1 drop, 1 drop, Both Eyes, QHS, Pool, Victory, MD, 1 drop at 07/16/23 2236   lisinopril  (ZESTRIL ) tablet 20 mg, 20 mg, Oral, BID, Louis Victory, MD, 20 mg at 07/17/23 1359   melatonin tablet 5 mg, 5 mg, Oral, QHS, Adhikari, Amrit, MD   menthol -cetylpyridinium (CEPACOL) lozenge 3 mg, 1 lozenge, Oral, PRN **OR** phenol (CHLORASEPTIC) mouth spray 1 spray, 1 spray, Mouth/Throat, PRN, Louis Victory, MD   ondansetron  (ZOFRAN ) tablet 4 mg, 4 mg, Oral, Q6H PRN **OR** ondansetron  (ZOFRAN ) injection 4 mg, 4 mg, Intravenous, Q6H PRN, Louis Victory, MD, 4 mg at 07/15/23 2322   polyethylene glycol (MIRALAX  / GLYCOLAX ) packet 17 g, 17 g, Oral, Daily, Adhikari, Amrit, MD, 17 g at 07/17/23 1400   senna-docusate (Senokot-S) tablet 1 tablet, 1 tablet, Oral, BID,  Jillian Buttery, MD, 1 tablet at 07/17/23 1359   sodium chloride  flush (NS) 0.9 % injection 3 mL, 3 mL, Intravenous, Q12H, Pool, Victory, MD, 3 mL at 07/17/23 1400   sodium chloride  flush (NS) 0.9 % injection 3 mL, 3 mL, Intravenous, PRN, Louis Victory, MD   vancomycin  (VANCOCIN ) 750 mg in sodium chloride  0.9 % 250 mL IVPB, 750 mg, Intravenous, Q M,W,F-HD, Louis Victory, MD, Stopped at 07/17/23 1220  Patients Current Diet:  Diet Order             Diet regular Room service appropriate? Yes; Fluid consistency: Thin  Diet effective now                  Precautions / Restrictions Precautions Precautions: Fall (Simultaneous filing. User may not have seen previous data.) Precaution Comments: pt blind; functional in own enviroment (Simultaneous filing. User may not have seen previous data.) Restrictions Weight Bearing Restrictions Per Provider Order: No   Has the patient had 2 or more falls or a fall with injury in the past year? No  Prior Activity Level Limited Community (1-2x/wk): Mod I with cane and RW in own environment; sponge bathes due to HD catheter  Prior Functional Level Self Care: Did the patient need help  bathing, dressing, using the toilet or eating? Independent  Indoor Mobility: Did the patient need assistance with walking from room to room (with or without device)? Independent  Stairs: Did the patient need assistance with internal or external stairs (with or without device)? Needed some help  Functional Cognition: Did the patient need help planning regular tasks such as shopping or remembering to take medications? Needed some help  Patient Information Are you of Hispanic, Latino/a,or Spanish origin?: A. No, not of Hispanic, Latino/a, or Spanish origin What is your race?: B. Black or African American Do you need or want an interpreter to communicate with a doctor or health care staff?: 0. No  Patient's Response To:  Health Literacy and Transportation Is the patient able to respond to health literacy and transportation needs?: Yes Health Literacy - How often do you need to have someone help you when you read instructions, pamphlets, or other written material from your doctor or pharmacy?: Always In the past 12 months, has lack of transportation kept you from medical appointments or from getting medications?: No In the past 12 months, has lack of transportation kept you from meetings, work, or from getting things needed for daily living?: No  Journalist, Newspaper / Equipment Home Equipment: BSC/3in1, Medical Laboratory Scientific Officer - single point, Rollator (4 wheels), Shower seat - built in Engineering Geologist. User may not have seen previous data.)  Prior Device Use: Indicate devices/aids used by the patient prior to current illness, exacerbation or injury? Walker and cane  Current Functional Level Cognition  Overall Cognitive Status: Within Functional Limits for tasks assessed (Simultaneous filing. User may not have seen previous data.) Orientation Level: Oriented X4 General Comments: Pt manages in apartment well with vision loss. Requires assist outside apartment.    Extremity Assessment (includes  Sensation/Coordination)  Upper Extremity Assessment: Right hand dominant, RUE deficits/detail (Simultaneous filing. User may not have seen previous data.) RUE Deficits / Details: AROM WFL. Strength: shoulder 4/5, biceps 5/5, triceps 3+/5, grip 4+/5 RUE Sensation: WNL RUE Coordination: decreased fine motor, decreased gross motor  Lower Extremity Assessment: Defer to PT evaluation (Simultaneous filing. User may not have seen previous data.)    ADLs  Overall ADL's : Needs assistance/impaired Eating/Feeding: Minimal assistance, Sitting Eating/Feeding  Details (indicate cue type and reason): Pt is independent feeding at home after set up.  Feel pt will have greater ease feeding self up in chair. Grooming: Wash/dry hands, Wash/dry face, Oral care, Minimal assistance, Sitting Grooming Details (indicate cue type and reason): assist more for vision and less for fine motor assist. Upper Body Bathing: Set up, Sitting Lower Body Bathing: Moderate assistance, Sit to/from stand, Cueing for compensatory techniques Lower Body Bathing Details (indicate cue type and reason): pt limited by pain and inability to access B LEs at this time. Upper Body Dressing : Moderate assistance, Sitting Upper Body Dressing Details (indicate cue type and reason): Pt with pain and unable to get shirt over head or around back to dress UE without assist. Lower Body Dressing: Maximal assistance, Sit to/from stand, Cueing for compensatory techniques Lower Body Dressing Details (indicate cue type and reason): Pain limiting pt from accessing LEs easily at this time. Pt normally crosses legs over but this is painful due to pulling legs up with BUEs. Toilet Transfer: Moderate assistance, Stand-pivot, BSC/3in1, Rolling walker (2 wheels) Toileting- Clothing Manipulation and Hygiene: Moderate assistance, Sit to/from stand, Cueing for compensatory techniques Functional mobility during ADLs: Moderate assistance, Rolling walker (2  wheels) General ADL Comments: Pt signficantly limited by pain and unsteadiness on feet during all mobility.    Mobility  Overal bed mobility: Needs Assistance (Simultaneous filing. User may not have seen previous data.) Bed Mobility: Rolling, Supine to Sit, Sit to Supine Rolling: Mod assist Supine to sit: Mod assist, Used rails Sit to supine: Mod assist, +2 for safety/equipment General bed mobility comments: Tactile cueing and hand over hand assist to find rail for roll R; heavy mod assist to come to sit; Heavy mod assist to help LEs back into bed and reposition back in supine    Transfers  Overall transfer level: Needs assistance Equipment used: Rolling walker (2 wheels) Transfers: Sit to/from Stand, Bed to chair/wheelchair/BSC Sit to Stand: Mod assist, From elevated surface Bed to/from chair/wheelchair/BSC transfer type:: Step pivot Step pivot transfers: Mod assist, From elevated surface General transfer comment: Pt required cues for hand placement and cues to bring calves off back of bed to pivot to chair.  Pt reports feeling unsteady on his feet but states this was his first time up to chair since surgery (although chair was prepared when therapist walked in) (Simultaneous filing. User may not have seen previous data.)    Ambulation / Gait / Stairs / Wheelchair Mobility       Posture / Balance Dynamic Sitting Balance Sitting balance - Comments: Sat EOB for at least 10 minutes; Needing up to heavy mod assist for steadying and balance EOB Balance Overall balance assessment: Needs assistance (Simultaneous filing. User may not have seen previous data.) Sitting-balance support: Bilateral upper extremity supported Sitting balance-Leahy Scale: Poor Sitting balance - Comments: Sat EOB for at least 10 minutes; Needing up to heavy mod assist for steadying and balance EOB Standing balance support: Bilateral upper extremity supported, Reliant on assistive device for balance Standing  balance-Leahy Scale: Poor Standing balance comment: pt with posterior lean pressing calves up against bed in standing to start    Special needs/care consideration OP hemodialysis [facility] MWF, uses [facility] transportation.Vancomycin  after HD sessions MWF for 6 weeks and then lifetime po antibiotics Complaints of constipation; does not urinate due to ESRD    Previous Home Environment  Living Arrangements: Spouse/significant other  Lives With: Spouse Available Help at Discharge: Family, Available 24 hours/day Type  of Home: Apartment Home Layout: One level Home Access: Level entry Bathroom Shower/Tub: Walk-in shower, Other (comment), Sponge bathes at baseline Bathroom Toilet: Standard Bathroom Accessibility: Yes How Accessible: Accessible via walker Home Care Services: No  Discharge Living Setting Plans for Discharge Living Setting: Lives with (comment), Apartment (wife) Type of Home at Discharge: Apartment Discharge Home Layout: One level Discharge Home Access: Level entry Discharge Bathroom Shower/Tub: Walk-in shower Discharge Bathroom Toilet: Standard Discharge Bathroom Accessibility: Yes How Accessible: Accessible via walker Does the patient have any problems obtaining your medications?: No  Social/Family/Support Systems Patient Roles: Spouse Contact Information: wife, [place] Anticipated Caregiver: wife Anticipated Caregiver's Contact Information: see contacts Ability/Limitations of Caregiver: no limitations Caregiver Availability: 24/7 Discharge Plan Discussed with Primary Caregiver: Yes Is Caregiver In Agreement with Plan?: Yes Does Caregiver/Family have Issues with Lodging/Transportation while Pt is in Rehab?: No  Goals Patient/Family Goal for Rehab: mod I to supervision wiht PT and OT Expected length of stay: ELOS 7 to 10 days Pt/Family Agrees to Admission and willing to participate: Yes Program Orientation Provided & Reviewed with Pt/Caregiver Including Roles  &  Responsibilities: Yes Additional Information Needs: uses [facility] transportation to OP dialysis  Decrease burden of Care through IP rehab admission: n/a  Possible need for SNF placement upon discharge: not anticipated  Patient Condition: I have reviewed medical records from [facility], spoken with  patient. I met with patient at the bedside for inpatient rehabilitation assessment.  Patient will benefit from ongoing PT and OT, can actively participate in 3 hours of therapy a day 5 days of the week, and can make measurable gains during the admission.  Patient will also benefit from the coordinated team approach during an Inpatient Acute Rehabilitation admission.  The patient will receive intensive therapy as well as Rehabilitation physician, nursing, social worker, and care management interventions.  Due to bladder management, bowel management, safety, skin/wound care, disease management, medication administration, pain management, and patient education the patient requires 24 hour a day rehabilitation nursing.  The patient is currently mod to max assist with mobility and basic ADLs.  Discharge setting and therapy post discharge at home with home health is anticipated.  Patient has agreed to participate in the Acute Inpatient Rehabilitation Program and will admit today.  Preadmission Screen Completed By:  Alison Heron Lot, RN MSN 07/17/2023 5:55 PM ______________________________________________________________________   Discussed status with Dr. Janely Gullickson on 07/18/23 at 1056 and received approval for admission today.  Admission Coordinator:  Alison Heron Lot, RN MSN time 1056 Date 07/18/23   Assessment/Plan: Diagnosis: Cervical osteomyelitis/discitis s/p cervical fusions Does the need for close, 24 hr/day Medical supervision in concert with the patient's rehab needs make it unreasonable for this patient to be served in a less intensive setting? 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 Yes Does the patient require coordinated care of a physician, rehab nurse, PT, OT, and SLP to address physical and functional deficits in the context of the above medical diagnosis(es)? Yes Addressing deficits in the following areas: balance, endurance, locomotion, strength, transferring, bowel/bladder control, bathing, dressing, feeding, grooming, and toileting Can the patient actively participate in an intensive therapy program of at least 3 hrs of therapy  5 days a week? Yes The potential for patient to make measurable gains while on inpatient rehab is good Anticipated functional outcomes upon discharge from inpatient rehab: modified independent and supervision PT, modified independent and supervision OT, n/a SLP Estimated rehab length of stay to reach the above functional goals is: 7-10 days Anticipated discharge destination: Home 10. Overall Rehab/Functional Prognosis: good   MD Signature:

## 2023-07-17 NOTE — Progress Notes (Signed)
   Providing Compassionate, Quality Care - Together   Subjective: Patient reports no new issues.  Objective: Vital signs in last 24 hours: Temp:  [97.5 F (36.4 C)-98.2 F (36.8 C)] 97.5 F (36.4 C) (01/08 1323) Pulse Rate:  [80-89] 89 (01/08 1323) Resp:  [10-20] 17 (01/08 1323) BP: (126-175)/(54-81) 151/70 (01/08 1323) SpO2:  [98 %-100 %] 99 % (01/08 1323) Weight:  [65.9 kg-68.7 kg] 65.9 kg (01/08 1230)  Intake/Output from previous day: 01/07 0701 - 01/08 0700 In: 700 [P.O.:700] Out: 0  Intake/Output this shift: Total I/O In: -  Out: 2000 [Other:2000]  Alert and oriented Speech clear MAE, strength close to baseline Incision covered with gauze dressing, dressing is c/d/i  Lab Results: Recent Labs    07/16/23 0412 07/17/23 0432  WBC 17.0* 9.3  HGB 11.1* 9.2*  HCT 35.1* 28.9*  PLT 316 341   BMET Recent Labs    07/16/23 0412 07/17/23 0432  NA 133* 130*  K 4.7 4.4  CL 97* 93*  CO2 21* 24  GLUCOSE 171* 258*  BUN 14 30*  CREATININE 4.75* 6.36*  CALCIUM  9.1 8.9    Studies/Results: DG Cervical Spine 2 or 3 views Result Date: 07/15/2023 CLINICAL DATA:  C6-C7 corpectomy and posterior cervical fusion C5-T1 EXAM: CERVICAL SPINE - 2-3 VIEW FLUOROSCOPY TIME:  Fluoroscopy Time:  26.1 seconds Radiation Exposure Index (if provided by the fluoroscopic device): 7.7 mGy Number of Acquired Spot Images: 7 COMPARISON:  MRI 07/09/2023 FINDINGS: Multiple intraoperative fluoroscopic spot images are provided without a radiologist present. IMPRESSION: Intraoperative fluoroscopic images during C6-C7 corpectomy and posterior cervical fusion C5-T1. See operative report for details. Electronically Signed   By: Norman Gatlin M.D.   On: 07/15/2023 23:18   DG C-Arm 1-60 Min-No Report Result Date: 07/15/2023 Fluoroscopy was utilized by the requesting physician.  No radiographic interpretation.    Assessment/Plan: Patient underwent C5, C7 corpectomy with anterior and posterior fixation  on 07/15/2023 by Dr. Louis. He is doing well postoperatively. His upper extremity strength is much improved.   LOS: 8 days   -Patient is a potential candidate for CIR -Continue to mobilize -Continue abx per ID  Gerard Beck, DNP, AGNP-C Nurse Practitioner  [facility] [address], [place], [place] [address] P: [phone]    F: [phone]  07/17/2023, 3:06 PM

## 2023-07-17 NOTE — Progress Notes (Signed)
 PROGRESS NOTE  Shawn [name]  FMW:[id number] DOB: June 12, 1961 DOA: 07/08/2023 PCP: Hillman Bare, MD   Brief Narrative: Patient is a 63 year old male with history of ESRD on dialysis MWF schedule, hypertension, diabetes type 2, hyperlipidemia, blindness who presented here on 12/31 with 2-week history of neck pain rating down upper back.  No history of trauma.  MRI of the cervical spine showed possible discitis/osteomyelitis C6/C7, vertebral body height loss and retropulsion with severe, stenosis, compression of spinal cord.  Neurosurgery consulted.  Status post anterior posterior decompression and fusion for treatment of cervical discitis with pathological fracture.  ID was following.  Currently on vancomycin .  Waiting for acute inpatient rehab bed.  Nephrology following for dialysis. 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  MRI as above.Status post anterior posterior decompression and fusion for treatment of cervical discitis with pathological fracture.  ID was following.  Currently on vancomycin .  ID recommended vancomycin  during dialysis: End date 08/26/2023.  ID will follow him as an outpatient and probably put him on chronic doxycycline  after he finishes IV antibiotics course.  Continue pain management, supportive care.  Continue cervical collar PT recommending AIR on discharge  ESRD on dialysis: Has dialysis catheter on right chest.  On Fosrenol , Rocaltrol .  Nephrology following  Diabetes type 2: Recent A1c of 5.8.  On Tresiba  at home.  Continue current insulin  regimen.  Might need to decrease long-acting insulin  dose on discharge.  Hypertension: On  amlodipine , Zestril   Blindness: Secondary to diabetic retinopathy.  Continue eyedrops  History of CVA: On Lipitor , aspirin   Constipation: Continue bowel regimen        DVT prophylaxis:SCD's Start: 07/16/23 0116 heparin  injection 5,000 Units Start: 07/09/23 1515     Code Status: Full Code  Family Communication: None at bedside  Patient status:Inpatient  Patient is from :Home  Anticipated discharge to:[facility]IR  Estimated DC date:whenever possible   Consultants: Neurosurgery, ID, nephrology  Procedures:anterior posterior decompression and fusion, dialysis  Antimicrobials:  Anti-infectives (From admission, onward)    Start     Dose/Rate Route Frequency Ordered Stop   07/16/23 0115  ceFAZolin  (ANCEF ) IVPB 1 g/50 mL premix  Status:  Discontinued        1 g 100 mL/hr over 30 Minutes Intravenous Every 8 hours 07/16/23 0115 07/16/23 0842   07/15/23 2305  vancomycin  (VANCOCIN ) powder  Status:  Discontinued          As needed 07/15/23 2305 07/15/23 2343   07/15/23 1200  vancomycin  (VANCOCIN ) 750 mg in sodium chloride  0.9 % 250 mL IVPB        750 mg 250 mL/hr over 60 Minutes Intravenous Every M-W-F (Hemodialysis) 07/14/23 0036     07/15/23 0600  ceFAZolin  (ANCEF ) IVPB 2g/100 mL premix        2 g 200 mL/hr over 30 Minutes Intravenous On call to O.R. 07/14/23 1927 07/15/23 1926   07/14/23 2030  ceFAZolin  (ANCEF ) IVPB 2g/100 mL premix  Status:  Discontinued       Note to Pharmacy: TO IR   2 g 200 mL/hr over 30 Minutes Intravenous To Radiology 07/14/23 1933 07/14/23 1943   07/12/23 1830  ceFAZolin  (ANCEF ) IVPB 2g/100 mL premix        2  g 200 mL/hr over 30 Minutes Intravenous  Once 07/12/23 1742 07/12/23 1814   07/12/23 1800  vancomycin  (VANCOREADY) IVPB 750 mg/150 mL  Status:  Discontinued        750 mg 150 mL/hr over 60 Minutes Intravenous Every M-W-F (Hemodialysis) 07/12/23 0824 07/12/23 1015   07/12/23 1800  vancomycin  (VANCOCIN ) 750 mg in sodium chloride  0.9 % 250 mL IVPB   Status:  Discontinued       Note to Pharmacy: Indication: Osteomyelitis   750 mg 265 mL/hr over 60 Minutes Intravenous Every M-W-F (Hemodialysis) 07/12/23 1015 07/14/23 0032   07/10/23 1800  vancomycin  (VANCOREADY) IVPB 750 mg/150 mL  Status:  Discontinued        750 mg 150 mL/hr over 60 Minutes Intravenous Every M-W-F (Hemodialysis) 07/09/23 1518 07/12/23 0824   07/09/23 1545  vancomycin  (VANCOREADY) IVPB 1500 mg/300 mL        1,500 mg 150 mL/hr over 120 Minutes Intravenous Once 07/09/23 1531 07/09/23 1858   07/09/23 1530  ceFEPIme  (MAXIPIME ) 1 g in sodium chloride  0.9 % 100 mL IVPB  Status:  Discontinued        1 g 200 mL/hr over 30 Minutes Intravenous Every 24 hours 07/09/23 1515 07/11/23 0814   07/09/23 1515  Vancomycin  (VANCOCIN ) 1,500 mg in sodium chloride  0.9 % 500 mL IVPB  Status:  Discontinued        1,500 mg 250 mL/hr over 120 Minutes Intravenous  Once 07/09/23 1512 07/09/23 1531   07/09/23 1515  ceFEPIme  (MAXIPIME ) 2 g in sodium chloride  0.9 % 100 mL IVPB  Status:  Discontinued        2 g 200 mL/hr over 30 Minutes Intravenous  Once 07/09/23 1512 07/09/23 1515       Subjective: Patient seen and examined at bedside today.  Hemodynamically stable.  He just finished dialysis.  Denies any significant pain in the neck.  Cervical collar in place.  Lack of bowel movement for last couple of days  Objective: Vitals:   07/17/23 1130 07/17/23 1200 07/17/23 1219 07/17/23 1230  BP: (!) 161/76 (!) 164/73  (!) 166/75  Pulse: 84 82 83 83  Resp: 10 10 12 10   Temp:    (!) 97.5 F (36.4 C)  TempSrc:      SpO2: 100% 100% 100% 99%  Weight:    65.9 kg  Height:        Intake/Output Summary (Last 24 hours) at 07/17/2023 1245 Last data filed at 07/17/2023 1230 Gross per 24 hour  Intake 460 ml  Output 2000 ml  Net -1540 ml   Filed Weights   07/15/23 1728 07/17/23 0839 07/17/23 1230  Weight: 66.3 kg 68.7 kg 65.9 kg    Examination:  General exam: Overall comfortable, not in  distress HEENT: PERRL,cervical collar, dressing on the neck Respiratory system:  no wheezes or crackles  Cardiovascular system: S1 & S2 heard, RRR.  Gastrointestinal system: Abdomen is nondistended, soft and nontender. Central nervous system: Alert and oriented.  Dialysis catheter on right chest Extremities: No edema, no clubbing ,no cyanosis Skin: No rashes, no ulcers,no icterus     Data Reviewed: I have personally reviewed following labs and imaging studies  CBC: Recent Labs  Lab 07/13/23 0527 07/14/23 1224 07/15/23 0056 07/15/23 1838 07/15/23 2142 07/16/23 0001 07/16/23 0412 07/17/23 0432  WBC 6.3 6.0 7.0  --   --   --  17.0* 9.3  NEUTROABS 4.1 3.8 4.2  --   --   --  16.1* 6.8  HGB 9.9* 11.0* 11.6* 13.9 8.8* 10.5* 11.1* 9.2*  HCT 31.5* 34.9* 37.0* 41.0 26.0* 31.0* 35.1* 28.9*  MCV 84.0 82.5 83.3  --   --   --  84.0 83.8  PLT 313 313 142*  --   --   --  316 341   Basic Metabolic Panel: Recent Labs  Lab 07/12/23 1303 07/13/23 0527 07/15/23 0056 07/15/23 1838 07/15/23 2142 07/16/23 0001 07/16/23 0412 07/17/23 0432  NA 132* 133* 127* 133* 138 135 133* 130*  K 3.4* 3.8 3.8 3.7 3.0* 4.1 4.7 4.4  CL 94* 96* 90* 97*  --   --  97* 93*  CO2 24 25 23   --   --   --  21* 24  GLUCOSE 80 206* 121* 110*  --   --  171* 258*  BUN 12 18 31* 15  --   --  14 30*  CREATININE 3.54* 4.83* 7.23* 4.70*  --   --  4.75* 6.36*  CALCIUM  9.0 8.5* 8.9  --   --   --  9.1 8.9  PHOS  --   --  3.2  --   --   --   --   --      Recent Results (from the past 240 hours)  Culture, blood (Routine X 2) w Reflex to ID Panel     Status: Abnormal   Collection Time: 07/09/23  3:23 PM   Specimen: BLOOD  Result Value Ref Range Status   Specimen Description BLOOD SITE NOT SPECIFIED  Final   Special Requests   Final    BOTTLES DRAWN AEROBIC AND ANAEROBIC Blood Culture adequate volume   Culture  Setup Time   Final    GRAM POSITIVE COCCI IN CLUSTERS ANAEROBIC BOTTLE ONLY CRITICAL VALUE NOTED.  VALUE IS  CONSISTENT WITH PREVIOUSLY REPORTED AND CALLED VALUE. Performed at [facility], [address], [place], [place] [address]    Culture STAPHYLOCOCCUS EPIDERMIDIS (A)  Final   Report Status 07/13/2023 FINAL  Final   Organism ID, Bacteria STAPHYLOCOCCUS EPIDERMIDIS  Final      Susceptibility   Staphylococcus epidermidis - MIC*    CIPROFLOXACIN <=0.5 SENSITIVE Sensitive     ERYTHROMYCIN  >=8 RESISTANT Resistant     GENTAMICIN <=0.5 SENSITIVE Sensitive     OXACILLIN >=4 RESISTANT Resistant     TETRACYCLINE >=16 RESISTANT Resistant     VANCOMYCIN  1 SENSITIVE Sensitive     TRIMETH /SULFA  <=10 SENSITIVE Sensitive     CLINDAMYCIN <=0.25 SENSITIVE Sensitive     RIFAMPIN <=0.5 SENSITIVE Sensitive     Inducible Clindamycin NEGATIVE Sensitive     * STAPHYLOCOCCUS EPIDERMIDIS  Culture, blood (Routine X 2) w Reflex to ID Panel     Status: Abnormal   Collection Time: 07/09/23  3:39 PM   Specimen: BLOOD  Result Value Ref Range Status   Specimen Description BLOOD SITE NOT SPECIFIED  Final   Special Requests   Final    BOTTLES DRAWN AEROBIC AND ANAEROBIC Blood Culture adequate volume   Culture  Setup Time   Final    GRAM POSITIVE COCCI IN CLUSTERS IN BOTH AEROBIC AND ANAEROBIC BOTTLES CRITICAL RESULT CALLED TO, READ BACK BY AND VERIFIED WITH: PHARMD JENNY ZHOU [phone], ADC CRITICAL VALUE NOTED.  VALUE IS CONSISTENT WITH PREVIOUSLY REPORTED AND CALLED VALUE. Performed at [facility], [address], [place], [place] [address]    Culture STAPHYLOCOCCUS EPIDERMIDIS (A)  Final   Report Status 07/12/2023 FINAL  Final  Organism ID, Bacteria STAPHYLOCOCCUS EPIDERMIDIS  Final      Susceptibility   Staphylococcus epidermidis - MIC*    CIPROFLOXACIN <=0.5 SENSITIVE Sensitive     ERYTHROMYCIN  >=8 RESISTANT Resistant     GENTAMICIN <=0.5 SENSITIVE Sensitive     OXACILLIN >=4 RESISTANT Resistant     TETRACYCLINE >=16 RESISTANT Resistant     VANCOMYCIN  2 SENSITIVE Sensitive      TRIMETH /SULFA  <=10 SENSITIVE Sensitive     CLINDAMYCIN <=0.25 SENSITIVE Sensitive     RIFAMPIN <=0.5 SENSITIVE Sensitive     Inducible Clindamycin NEGATIVE Sensitive     * STAPHYLOCOCCUS EPIDERMIDIS  Blood Culture ID Panel (Reflexed)     Status: Abnormal   Collection Time: 07/09/23  3:39 PM  Result Value Ref Range Status   Enterococcus faecalis NOT DETECTED NOT DETECTED Final   Enterococcus Faecium NOT DETECTED NOT DETECTED Final   Listeria monocytogenes NOT DETECTED NOT DETECTED Final   Staphylococcus species DETECTED (A) NOT DETECTED Final    Comment: CRITICAL RESULT CALLED TO, READ BACK BY AND VERIFIED WITH: PHARMD JENNY ZHOU [phone], ADC    Staphylococcus aureus (BCID) NOT DETECTED NOT DETECTED Final   Staphylococcus epidermidis DETECTED (A) NOT DETECTED Final    Comment: Methicillin (oxacillin) resistant coagulase negative staphylococcus. Possible blood culture contaminant (unless isolated from more than one blood culture draw or clinical case suggests pathogenicity). No antibiotic treatment is indicated for blood  culture contaminants. CRITICAL RESULT CALLED TO, READ BACK BY AND VERIFIED WITH: PHARMD JENNY ZHOU [phone], ADC    Staphylococcus lugdunensis NOT DETECTED NOT DETECTED Final   Streptococcus species NOT DETECTED NOT DETECTED Final   Streptococcus agalactiae NOT DETECTED NOT DETECTED Final   Streptococcus pneumoniae NOT DETECTED NOT DETECTED Final   Streptococcus pyogenes NOT DETECTED NOT DETECTED Final   A.calcoaceticus-baumannii NOT DETECTED NOT DETECTED Final   Bacteroides fragilis NOT DETECTED NOT DETECTED Final   Enterobacterales NOT DETECTED NOT DETECTED Final   Enterobacter cloacae complex NOT DETECTED NOT DETECTED Final   Escherichia coli NOT DETECTED NOT DETECTED Final   Klebsiella aerogenes NOT DETECTED NOT DETECTED Final   Klebsiella oxytoca NOT DETECTED NOT DETECTED Final   Klebsiella pneumoniae NOT DETECTED NOT DETECTED Final   Proteus species NOT  DETECTED NOT DETECTED Final   Salmonella species NOT DETECTED NOT DETECTED Final   Serratia marcescens NOT DETECTED NOT DETECTED Final   Haemophilus influenzae NOT DETECTED NOT DETECTED Final   Neisseria meningitidis NOT DETECTED NOT DETECTED Final   Pseudomonas aeruginosa NOT DETECTED NOT DETECTED Final   Stenotrophomonas maltophilia NOT DETECTED NOT DETECTED Final   Candida albicans NOT DETECTED NOT DETECTED Final   Candida auris NOT DETECTED NOT DETECTED Final   Candida glabrata NOT DETECTED NOT DETECTED Final   Candida krusei NOT DETECTED NOT DETECTED Final   Candida parapsilosis NOT DETECTED NOT DETECTED Final   Candida tropicalis NOT DETECTED NOT DETECTED Final   Cryptococcus neoformans/gattii NOT DETECTED NOT DETECTED Final   Methicillin resistance mecA/C DETECTED (A) NOT DETECTED Final    Comment: CRITICAL RESULT CALLED TO, READ BACK BY AND VERIFIED WITH: PHARMD JENNY ZHOU [phone], ADC Performed at [facility], [address], [place], [place] [address]   MRSA Next Gen by PCR, Nasal     Status: None   Collection Time: 07/09/23  6:42 PM   Specimen: Nasal Mucosa; Nasal Swab  Result Value Ref Range Status   MRSA by PCR Next Gen NOT DETECTED NOT DETECTED Final    Comment: (  NOTE) The GeneXpert MRSA Assay (FDA approved for NASAL specimens only), is one component of a comprehensive MRSA colonization surveillance program. It is not intended to diagnose MRSA infection nor to guide or monitor treatment for MRSA infections. Test performance is not FDA approved in patients less than 32 years old. Performed at [facility], [address], [place], [place] [address]   Culture, blood (Routine X 2) w Reflex to ID Panel     Status: None   Collection Time: 07/11/23  3:47 PM   Specimen: BLOOD RIGHT ARM  Result Value Ref Range Status   Specimen Description BLOOD RIGHT ARM  Final   Special Requests   Final    BOTTLES DRAWN AEROBIC ONLY Blood Culture results may not be  optimal due to an inadequate volume of blood received in culture bottles   Culture   Final    NO GROWTH 5 DAYS Performed at [facility], [address], [place], [place] [address]    Report Status 07/16/2023 FINAL  Final  Culture, blood (Routine X 2) w Reflex to ID Panel     Status: None   Collection Time: 07/11/23  3:49 PM   Specimen: BLOOD RIGHT ARM  Result Value Ref Range Status   Specimen Description BLOOD RIGHT ARM  Final   Special Requests   Final    BOTTLES DRAWN AEROBIC ONLY Blood Culture results may not be optimal due to an inadequate volume of blood received in culture bottles   Culture   Final    NO GROWTH 5 DAYS Performed at [facility], [address], [place], [place] [address]    Report Status 07/16/2023 FINAL  Final  Surgical pcr screen     Status: None   Collection Time: 07/12/23  4:46 AM   Specimen: Nasal Mucosa; Nasal Swab  Result Value Ref Range Status   MRSA, PCR NEGATIVE NEGATIVE Final   Staphylococcus aureus NEGATIVE NEGATIVE Final    Comment: (NOTE) The Xpert SA Assay (FDA approved for NASAL specimens in patients 63 years of age and older), is one component of a comprehensive surveillance program. It is not intended to diagnose infection nor to guide or monitor treatment. Performed at [facility], [address] [place], [place] [address]      Radiology Studies: DG Cervical Spine 2 or 3 views Result Date: 07/15/2023 CLINICAL DATA:  C6-C7 corpectomy and posterior cervical fusion C5-T1 EXAM: CERVICAL SPINE - 2-3 VIEW FLUOROSCOPY TIME:  Fluoroscopy Time:  26.1 seconds Radiation Exposure Index (if provided by the fluoroscopic device): 7.7 mGy Number of Acquired Spot Images: 7 COMPARISON:  MRI 07/09/2023 FINDINGS: Multiple intraoperative fluoroscopic spot images are provided without a radiologist present. IMPRESSION: Intraoperative fluoroscopic images during C6-C7 corpectomy and posterior cervical fusion C5-T1. See operative report for  details. Electronically Signed   By: Norman Gatlin M.D.   On: 07/15/2023 23:18   DG C-Arm 1-60 Min-No Report Result Date: 07/15/2023 Fluoroscopy was utilized by the requesting physician.  No radiographic interpretation.    Scheduled Meds:  (feeding supplement) PROSource Plus  30 mL Oral BID BM   amLODipine   5 mg Oral Daily   aspirin  EC  325 mg Oral QHS   atorvastatin   80 mg Oral Daily   brimonidine   1 drop Both Eyes BID   calcitRIOL   0.5 mcg Oral Q M,W,F   Chlorhexidine  Gluconate Cloth  6 each Topical Q0600   darbepoetin (ARANESP ) injection - DIALYSIS  60 mcg Subcutaneous  Q Wed-1800   dorzolamide -timolol   1 drop Both Eyes BID   heparin   5,000 Units Subcutaneous Q8H   insulin  aspart  0-9 Units Subcutaneous TID WC   insulin  glargine-yfgn  2 Units Subcutaneous QHS   lanthanum   1,000 mg Oral TID WC   latanoprost   1 drop Both Eyes QHS   lisinopril   20 mg Oral BID   sodium chloride  flush  3 mL Intravenous Q12H   Continuous Infusions:  sodium chloride      sodium chloride      heparin  sodium (porcine)     vancomycin  (VANCOCIN ) 750 mg in sodium chloride  0.9 % 250 mL IVPB Stopped (07/17/23 1220)     LOS: 8 days   Ivonne Mustache, MD [facility] P1/02/2024, 12:45 PM

## 2023-07-17 NOTE — Progress Notes (Signed)
  Inpatient Rehabilitation Admissions Coordinator   Met with patient at bedside for rehab assessment. We discussed goals and expectations of a possible CIR admit. Prior to admit he used cane and RW to mobilize. Completed his own ADLS, sponge bathed due to Dialysis catheter. He prefers CIR admit prior to discharge home. I will discuss case with Rehab MD in the am and follow up to let team know if patient would be a candidate for CIR or not.Please call me with any questions.   Heron Leavell, RN, MSN Rehab Admissions Coordinator [phone]

## 2023-07-17 NOTE — Progress Notes (Signed)
 Received patient in bed to unit.  Alert and oriented.  Informed consent signed and in chart.   TX duration:3.5 hours  Patient tolerated well.  Transported back to the room  Alert, without acute distress.  Hand-off given to patient's nurse Luke Meissner   Access used: RIJ   Access issues: none  Total UF removed: 2000 ml Medication(s) given: Vancomycin  Post HD VS: BP166/75 p 83 resp 10 temp 97.5 Post HD weight: 65.9kg  Mase Dhondt, RN Kidney Dialysis Unit

## 2023-07-18 ENCOUNTER — Inpatient Hospital Stay (HOSPITAL_COMMUNITY)
Admission: AD | Admit: 2023-07-18 | Discharge: 2023-08-04 | DRG: 559 | Disposition: A | Discharge status: Home Health | Payer: Medicare Other | Source: Intra-hospital | Attending: Physical Medicine and Rehabilitation | Admitting: Physical Medicine and Rehabilitation

## 2023-07-18 ENCOUNTER — Encounter (HOSPITAL_COMMUNITY): Payer: Self-pay | Admitting: Physical Medicine and Rehabilitation

## 2023-07-18 ENCOUNTER — Other Ambulatory Visit: Payer: Self-pay

## 2023-07-18 DIAGNOSIS — I120 Hypertensive chronic kidney disease with stage 5 chronic kidney disease or end stage renal disease: Secondary | ICD-10-CM | POA: Diagnosis present

## 2023-07-18 DIAGNOSIS — E11649 Type 2 diabetes mellitus with hypoglycemia without coma: Secondary | ICD-10-CM | POA: Diagnosis not present

## 2023-07-18 DIAGNOSIS — M4642 Discitis, unspecified, cervical region: Secondary | ICD-10-CM | POA: Diagnosis present

## 2023-07-18 DIAGNOSIS — S14159D Other incomplete lesion at unspecified level of cervical spinal cord, subsequent encounter: Secondary | ICD-10-CM

## 2023-07-18 DIAGNOSIS — M4622 Osteomyelitis of vertebra, cervical region: Secondary | ICD-10-CM | POA: Diagnosis not present

## 2023-07-18 DIAGNOSIS — M4802 Spinal stenosis, cervical region: Secondary | ICD-10-CM | POA: Diagnosis present

## 2023-07-18 DIAGNOSIS — Z8249 Family history of ischemic heart disease and other diseases of the circulatory system: Secondary | ICD-10-CM

## 2023-07-18 DIAGNOSIS — Z992 Dependence on renal dialysis: Secondary | ICD-10-CM

## 2023-07-18 DIAGNOSIS — Z8673 Personal history of transient ischemic attack (TIA), and cerebral infarction without residual deficits: Secondary | ICD-10-CM

## 2023-07-18 DIAGNOSIS — N2581 Secondary hyperparathyroidism of renal origin: Secondary | ICD-10-CM | POA: Diagnosis present

## 2023-07-18 DIAGNOSIS — Z79899 Other long term (current) drug therapy: Secondary | ICD-10-CM | POA: Diagnosis not present

## 2023-07-18 DIAGNOSIS — D631 Anemia in chronic kidney disease: Secondary | ICD-10-CM | POA: Diagnosis present

## 2023-07-18 DIAGNOSIS — Z823 Family history of stroke: Secondary | ICD-10-CM

## 2023-07-18 DIAGNOSIS — G992 Myelopathy in diseases classified elsewhere: Secondary | ICD-10-CM | POA: Diagnosis present

## 2023-07-18 DIAGNOSIS — L89151 Pressure ulcer of sacral region, stage 1: Secondary | ICD-10-CM | POA: Diagnosis present

## 2023-07-18 DIAGNOSIS — R34 Anuria and oliguria: Secondary | ICD-10-CM | POA: Diagnosis not present

## 2023-07-18 DIAGNOSIS — K5901 Slow transit constipation: Secondary | ICD-10-CM | POA: Diagnosis not present

## 2023-07-18 DIAGNOSIS — G959 Disease of spinal cord, unspecified: Secondary | ICD-10-CM

## 2023-07-18 DIAGNOSIS — R7881 Bacteremia: Secondary | ICD-10-CM | POA: Diagnosis present

## 2023-07-18 DIAGNOSIS — K5900 Constipation, unspecified: Secondary | ICD-10-CM | POA: Diagnosis present

## 2023-07-18 DIAGNOSIS — E11319 Type 2 diabetes mellitus with unspecified diabetic retinopathy without macular edema: Secondary | ICD-10-CM | POA: Diagnosis present

## 2023-07-18 DIAGNOSIS — E1165 Type 2 diabetes mellitus with hyperglycemia: Secondary | ICD-10-CM | POA: Diagnosis present

## 2023-07-18 DIAGNOSIS — M4852XD Collapsed vertebra, not elsewhere classified, cervical region, subsequent encounter for fracture with routine healing: Secondary | ICD-10-CM | POA: Diagnosis present

## 2023-07-18 DIAGNOSIS — Z833 Family history of diabetes mellitus: Secondary | ICD-10-CM

## 2023-07-18 DIAGNOSIS — N250 Renal osteodystrophy: Secondary | ICD-10-CM | POA: Diagnosis not present

## 2023-07-18 DIAGNOSIS — H548 Legal blindness, as defined in USA: Secondary | ICD-10-CM | POA: Diagnosis present

## 2023-07-18 DIAGNOSIS — R011 Cardiac murmur, unspecified: Secondary | ICD-10-CM | POA: Diagnosis present

## 2023-07-18 DIAGNOSIS — R159 Full incontinence of feces: Secondary | ICD-10-CM | POA: Diagnosis not present

## 2023-07-18 DIAGNOSIS — N186 End stage renal disease: Secondary | ICD-10-CM | POA: Diagnosis present

## 2023-07-18 DIAGNOSIS — B957 Other staphylococcus as the cause of diseases classified elsewhere: Secondary | ICD-10-CM | POA: Diagnosis present

## 2023-07-18 DIAGNOSIS — Z4789 Encounter for other orthopedic aftercare: Secondary | ICD-10-CM | POA: Diagnosis not present

## 2023-07-18 DIAGNOSIS — I10 Essential (primary) hypertension: Secondary | ICD-10-CM | POA: Diagnosis not present

## 2023-07-18 DIAGNOSIS — E1122 Type 2 diabetes mellitus with diabetic chronic kidney disease: Secondary | ICD-10-CM | POA: Diagnosis present

## 2023-07-18 DIAGNOSIS — K592 Neurogenic bowel, not elsewhere classified: Secondary | ICD-10-CM | POA: Diagnosis present

## 2023-07-18 DIAGNOSIS — Z820 Family history of epilepsy and other diseases of the nervous system: Secondary | ICD-10-CM

## 2023-07-18 DIAGNOSIS — Z7982 Long term (current) use of aspirin: Secondary | ICD-10-CM

## 2023-07-18 DIAGNOSIS — E785 Hyperlipidemia, unspecified: Secondary | ICD-10-CM | POA: Diagnosis present

## 2023-07-18 DIAGNOSIS — E871 Hypo-osmolality and hyponatremia: Secondary | ICD-10-CM | POA: Diagnosis present

## 2023-07-18 DIAGNOSIS — Z981 Arthrodesis status: Secondary | ICD-10-CM

## 2023-07-18 DIAGNOSIS — E1169 Type 2 diabetes mellitus with other specified complication: Secondary | ICD-10-CM | POA: Diagnosis present

## 2023-07-18 LAB — BASIC METABOLIC PANEL
Anion gap: 9 (ref 5–15)
BUN: 15 mg/dL (ref 8–23)
CO2: 26 mmol/L (ref 22–32)
Calcium: 8.6 mg/dL — ABNORMAL LOW (ref 8.9–10.3)
Chloride: 96 mmol/L — ABNORMAL LOW (ref 98–111)
Creatinine, Ser: 4.29 mg/dL — ABNORMAL HIGH (ref 0.61–1.24)
GFR, Estimated: 15 mL/min — ABNORMAL LOW (ref >60)
Glucose, Bld: 145 mg/dL — ABNORMAL HIGH (ref 70–99)
Potassium: 4 mmol/L (ref 3.5–5.1)
Sodium: 131 mmol/L — ABNORMAL LOW (ref 135–145)

## 2023-07-18 LAB — CBC WITH DIFFERENTIAL/PLATELET
Abs Immature Granulocytes: 0.02 10*3/uL (ref 0.00–0.07)
Basophils Absolute: 0.1 10*3/uL (ref 0.0–0.1)
Basophils Relative: 1 %
Eosinophils Absolute: 0.6 10*3/uL — ABNORMAL HIGH (ref 0.0–0.5)
Eosinophils Relative: 9 %
HCT: 27.7 % — ABNORMAL LOW (ref 39.0–52.0)
Hemoglobin: 9 g/dL — ABNORMAL LOW (ref 13.0–17.0)
Immature Granulocytes: 0 %
Lymphocytes Relative: 12 %
Lymphs Abs: 0.9 10*3/uL (ref 0.7–4.0)
MCH: 27.2 pg (ref 26.0–34.0)
MCHC: 32.5 g/dL (ref 30.0–36.0)
MCV: 83.7 fL (ref 80.0–100.0)
Monocytes Absolute: 0.8 10*3/uL (ref 0.1–1.0)
Monocytes Relative: 10 %
Neutro Abs: 5.2 10*3/uL (ref 1.7–7.7)
Neutrophils Relative %: 68 %
Platelets: 250 10*3/uL (ref 150–400)
RBC: 3.31 MIL/uL — ABNORMAL LOW (ref 4.22–5.81)
RDW: 16.1 % — ABNORMAL HIGH (ref 11.5–15.5)
WBC: 7.6 10*3/uL (ref 4.0–10.5)
nRBC: 0 % (ref 0.0–0.2)

## 2023-07-18 LAB — GLUCOSE, CAPILLARY
Glucose-Capillary: 129 mg/dL — ABNORMAL HIGH (ref 70–99)
Glucose-Capillary: 116 mg/dL — ABNORMAL HIGH (ref 70–99)
Glucose-Capillary: 138 mg/dL — ABNORMAL HIGH (ref 70–99)
Glucose-Capillary: 135 mg/dL — ABNORMAL HIGH (ref 70–99)

## 2023-07-18 LAB — HCV INTERPRETATION

## 2023-07-18 LAB — HCV AB W REFLEX TO QUANT PCR: HCV Ab: NONREACTIVE

## 2023-07-18 MED ORDER — HEPARIN SODIUM (PORCINE) 1000 UNIT/ML IJ SOLN
3800.0000 [IU] | INTRAMUSCULAR | Status: DC | PRN
  Administered 2023-07-19 – 2023-08-02 (×5): 3800 [IU]
  Filled 2023-07-18: qty 3.8
  Filled 2023-07-18 (×6): qty 4

## 2023-07-18 MED ORDER — ONDANSETRON HCL 4 MG/2ML IJ SOLN: 4.0000 mg | Freq: Four times a day (QID) | INTRAMUSCULAR | Status: DC | PRN

## 2023-07-18 MED ORDER — ACETAMINOPHEN 325 MG PO TABS: 325.0000 mg | ORAL_TABLET | ORAL | Status: DC | PRN

## 2023-07-18 MED ORDER — ASPIRIN 325 MG PO TBEC
325.0000 mg | DELAYED_RELEASE_TABLET | Freq: Every day | ORAL | Status: DC
Start: 2023-07-18 — End: 2023-08-04
  Administered 2023-07-18 – 2023-08-03 (×16): 325 mg via ORAL
  Filled 2023-07-18 (×16): qty 1

## 2023-07-18 MED ORDER — VANCOMYCIN HCL 750 MG IV SOLR
750.0000 mg | INTRAVENOUS | Status: DC
Start: 2023-07-19 — End: 2023-08-04
  Administered 2023-07-19 – 2023-08-02 (×6): 750 mg via INTRAVENOUS
  Filled 2023-07-18 (×8): qty 15

## 2023-07-18 MED ORDER — DORZOLAMIDE HCL-TIMOLOL MAL 2-0.5 % OP SOLN
1.0000 [drp] | Freq: Two times a day (BID) | OPHTHALMIC | Status: DC
  Administered 2023-07-18 – 2023-08-04 (×34): 1 [drp] via OPHTHALMIC
  Filled 2023-07-18: qty 10

## 2023-07-18 MED ORDER — HYDROCODONE-ACETAMINOPHEN 10-325 MG PO TABS: 2.0000 | ORAL_TABLET | ORAL | Status: DC | PRN

## 2023-07-18 MED ORDER — HEPARIN SODIUM (PORCINE) 5000 UNIT/ML IJ SOLN: 5000.0000 [IU] | Freq: Three times a day (TID) | INTRAMUSCULAR | Status: DC

## 2023-07-18 MED ORDER — CARVEDILOL 3.125 MG PO TABS: 3.1250 mg | ORAL_TABLET | Freq: Two times a day (BID) | ORAL | 11 refills | Status: DC

## 2023-07-18 MED ORDER — CHLORHEXIDINE GLUCONATE CLOTH 2 % EX PADS
6.0000 | MEDICATED_PAD | Freq: Every day | CUTANEOUS | Status: DC
  Administered 2023-07-19: 6 via TOPICAL

## 2023-07-18 MED ORDER — LISINOPRIL 20 MG PO TABS
20.0000 mg | ORAL_TABLET | Freq: Two times a day (BID) | ORAL | Status: DC
  Administered 2023-07-18 – 2023-08-04 (×31): 20 mg via ORAL
  Filled 2023-07-18 (×31): qty 1

## 2023-07-18 MED ORDER — POLYETHYLENE GLYCOL 3350 17 G PO PACK
17.0000 g | PACK | Freq: Every day | ORAL | Status: DC
  Administered 2023-07-19: 17 g via ORAL
  Filled 2023-07-18: qty 1

## 2023-07-18 MED ORDER — CARVEDILOL 3.125 MG PO TABS: 3.1250 mg | ORAL_TABLET | Freq: Two times a day (BID) | ORAL | Status: DC

## 2023-07-18 MED ORDER — CALCITRIOL 0.5 MCG PO CAPS
0.5000 ug | ORAL_CAPSULE | ORAL | Status: DC
Start: 2023-07-19 — End: 2023-08-04
  Administered 2023-07-19 – 2023-08-02 (×8): 0.5 ug via ORAL
  Filled 2023-07-18 (×10): qty 1

## 2023-07-18 MED ORDER — INSULIN GLARGINE-YFGN 100 UNIT/ML ~~LOC~~ SOLN
2.0000 [IU] | Freq: Every day | SUBCUTANEOUS | Status: DC
  Administered 2023-07-18 – 2023-07-28 (×11): 2 [IU] via SUBCUTANEOUS
  Filled 2023-07-18 (×12): qty 0.02

## 2023-07-18 MED ORDER — DIPHENHYDRAMINE HCL 25 MG PO CAPS: 25.0000 mg | ORAL_CAPSULE | Freq: Four times a day (QID) | ORAL | Status: DC | PRN

## 2023-07-18 MED ORDER — FLEET ENEMA RE ENEM
1.0000 | ENEMA | Freq: Once | RECTAL | Status: AC | PRN
  Administered 2023-07-18: 1 via RECTAL
  Filled 2023-07-18: qty 1

## 2023-07-18 MED ORDER — CARVEDILOL 3.125 MG PO TABS
3.1250 mg | ORAL_TABLET | Freq: Two times a day (BID) | ORAL | Status: DC
  Administered 2023-07-18 – 2023-08-04 (×30): 3.125 mg via ORAL
  Filled 2023-07-18 (×31): qty 1

## 2023-07-18 MED ORDER — MELATONIN 5 MG PO TABS
5.0000 mg | ORAL_TABLET | Freq: Every day | ORAL | Status: DC
  Administered 2023-07-18 – 2023-08-03 (×16): 5 mg via ORAL
  Filled 2023-07-18 (×16): qty 1

## 2023-07-18 MED ORDER — SENNOSIDES-DOCUSATE SODIUM 8.6-50 MG PO TABS: 1.0000 | ORAL_TABLET | Freq: Two times a day (BID) | ORAL | Status: DC

## 2023-07-18 MED ORDER — ACETAMINOPHEN 325 MG PO TABS
650.0000 mg | ORAL_TABLET | Freq: Four times a day (QID) | ORAL | Status: DC | PRN
  Administered 2023-07-21: 650 mg via ORAL
  Filled 2023-07-18: qty 2

## 2023-07-18 MED ORDER — ATORVASTATIN CALCIUM 80 MG PO TABS
80.0000 mg | ORAL_TABLET | Freq: Every day | ORAL | Status: DC
Start: 2023-07-19 — End: 2023-08-04
  Administered 2023-07-19 – 2023-08-04 (×17): 80 mg via ORAL
  Filled 2023-07-18 (×17): qty 1

## 2023-07-18 MED ORDER — DARBEPOETIN ALFA 60 MCG/0.3ML IJ SOSY
60.0000 ug | PREFILLED_SYRINGE | INTRAMUSCULAR | Status: DC
  Administered 2023-07-24: 60 ug via SUBCUTANEOUS
  Filled 2023-07-18: qty 0.3

## 2023-07-18 MED ORDER — ACETAMINOPHEN 650 MG RE SUPP: 650.0000 mg | Freq: Four times a day (QID) | RECTAL | Status: DC | PRN

## 2023-07-18 MED ORDER — HYDROCODONE-ACETAMINOPHEN 10-325 MG PO TABS
2.0000 | ORAL_TABLET | ORAL | Status: DC | PRN
  Administered 2023-07-18 – 2023-07-19 (×2): 2 via ORAL
  Filled 2023-07-18 (×3): qty 2

## 2023-07-18 MED ORDER — ONDANSETRON HCL 4 MG PO TABS
4.0000 mg | ORAL_TABLET | Freq: Four times a day (QID) | ORAL | Status: DC | PRN
  Administered 2023-07-22 – 2023-07-23 (×2): 4 mg via ORAL
  Filled 2023-07-18 (×3): qty 1

## 2023-07-18 MED ORDER — HEPARIN SODIUM (PORCINE) 5000 UNIT/ML IJ SOLN
5000.0000 [IU] | Freq: Three times a day (TID) | INTRAMUSCULAR | Status: DC
  Administered 2023-07-18 – 2023-08-04 (×44): 5000 [IU] via SUBCUTANEOUS
  Filled 2023-07-18 (×44): qty 1

## 2023-07-18 MED ORDER — GUAIFENESIN-DM 100-10 MG/5ML PO SYRP
10.0000 mL | ORAL_SOLUTION | Freq: Four times a day (QID) | ORAL | Status: DC | PRN
Start: 2023-07-18 — End: 2023-08-04

## 2023-07-18 MED ORDER — ATORVASTATIN CALCIUM 80 MG PO TABS: 80.0000 mg | ORAL_TABLET | Freq: Every day | ORAL | 11 refills | Status: AC

## 2023-07-18 MED ORDER — BISACODYL 10 MG RE SUPP
10.0000 mg | Freq: Once | RECTAL | Status: AC
  Administered 2023-07-18: 10 mg via RECTAL
  Filled 2023-07-18: qty 1

## 2023-07-18 MED ORDER — CYCLOBENZAPRINE HCL 10 MG PO TABS
10.0000 mg | ORAL_TABLET | Freq: Three times a day (TID) | ORAL | Status: DC | PRN
  Administered 2023-07-22 – 2023-07-23 (×2): 10 mg via ORAL
  Filled 2023-07-18 (×2): qty 1

## 2023-07-18 MED ORDER — INSULIN ASPART 100 UNIT/ML IJ SOLN
0.0000 [IU] | Freq: Three times a day (TID) | INTRAMUSCULAR | Status: DC
  Administered 2023-07-18 – 2023-07-20 (×4): 1 [IU] via SUBCUTANEOUS
  Administered 2023-07-20: 2 [IU] via SUBCUTANEOUS
  Administered 2023-07-20 – 2023-07-29 (×11): 1 [IU] via SUBCUTANEOUS

## 2023-07-18 MED ORDER — VANCOMYCIN IV (FOR PTA / DISCHARGE USE ONLY): 750.0000 mg | INTRAVENOUS | Status: AC

## 2023-07-18 MED ORDER — SENNOSIDES-DOCUSATE SODIUM 8.6-50 MG PO TABS
1.0000 | ORAL_TABLET | Freq: Two times a day (BID) | ORAL | Status: DC
  Administered 2023-07-18 – 2023-07-19 (×2): 1 via ORAL
  Filled 2023-07-18 (×2): qty 1

## 2023-07-18 MED ORDER — MELATONIN 5 MG PO TABS: 5.0000 mg | ORAL_TABLET | Freq: Every day | ORAL | Status: DC

## 2023-07-18 MED ORDER — TRESIBA FLEXTOUCH 100 UNIT/ML ~~LOC~~ SOPN: 5.0000 [IU] | PEN_INJECTOR | Freq: Every day | SUBCUTANEOUS | Status: DC

## 2023-07-18 MED ORDER — BISACODYL 5 MG PO TBEC: 5.0000 mg | DELAYED_RELEASE_TABLET | Freq: Every day | ORAL | Status: DC | PRN

## 2023-07-18 MED ORDER — BRIMONIDINE TARTRATE 0.2 % OP SOLN
1.0000 [drp] | Freq: Two times a day (BID) | OPHTHALMIC | Status: DC
Start: 2023-07-18 — End: 2023-08-04
  Administered 2023-07-18 – 2023-08-04 (×34): 1 [drp] via OPHTHALMIC
  Filled 2023-07-18: qty 5

## 2023-07-18 MED ORDER — LATANOPROST 0.005 % OP SOLN
1.0000 [drp] | Freq: Every day | OPHTHALMIC | Status: DC
  Administered 2023-07-18 – 2023-08-03 (×17): 1 [drp] via OPHTHALMIC
  Filled 2023-07-18: qty 2.5

## 2023-07-18 MED ORDER — PROSOURCE PLUS PO LIQD
30.0000 mL | Freq: Two times a day (BID) | ORAL | Status: DC
  Administered 2023-07-19 – 2023-07-20 (×2): 30 mL via ORAL
  Filled 2023-07-18 (×5): qty 30

## 2023-07-18 MED ORDER — POLYETHYLENE GLYCOL 3350 17 G PO PACK: 17.0000 g | PACK | Freq: Every day | ORAL | Status: DC

## 2023-07-18 MED ORDER — HYDROCODONE-ACETAMINOPHEN 5-325 MG PO TABS: 1.0000 | ORAL_TABLET | ORAL | Status: DC | PRN

## 2023-07-18 MED ORDER — LANTHANUM CARBONATE 500 MG PO CHEW
1000.0000 mg | CHEWABLE_TABLET | Freq: Three times a day (TID) | ORAL | Status: DC
  Administered 2023-07-18 – 2023-07-27 (×3): 1000 mg via ORAL
  Filled 2023-07-18 (×27): qty 2

## 2023-07-18 NOTE — TOC Transition Note (Signed)
 Transition of Care [facility]) - Discharge Note   Patient Details  Name: Shawn [name] MRN: [id number] Date of Birth: 23-Aug-1960  [facility] CM/SW Contact:  Tom-Johnson, Harvest Muskrat, RN Phone Number: 07/18/2023, 12:22 PM   Clinical Narrative:     Patient is scheduled for discharge today to CIR.  Readmission Risk Assessment done. Patient will be transported via bed as in-hospital transfer. No further TOC needs noted.         Final next level of care: IP Rehab Facility Barriers to Discharge: Barriers Resolved   Patient Goals and CMS Choice Patient states their goals for this hospitalization and ongoing recovery are:: To go to rehab and return home. CMS Medicare.gov Compare Post Acute Care list provided to:: Patient Choice offered to / list presented to : Patient      Discharge Placement                Patient to be transferred to facility by: In-hospital transfer via bed.      Discharge Plan and Services Additional resources added to the After Visit Summary for                  DME Arranged: N/A DME Agency: NA       HH Arranged: NA HH Agency: NA        Social Drivers of Health (SDOH) Interventions SDOH Screenings   Food Insecurity: No Food Insecurity (07/09/2023)  Housing: Low Risk  (07/09/2023)  Transportation Needs: No Transportation Needs (07/09/2023)  Utilities: Not At Risk (07/09/2023)  Tobacco Use: Low Risk  (07/15/2023)     Readmission Risk Interventions    07/16/2023    4:18 PM 12/20/2022    1:41 PM  Readmission Risk Prevention Plan  Transportation Screening Complete Complete  PCP or Specialist Appt within 5-7 Days  Complete  PCP or Specialist Appt within 3-5 Days Complete   Home Care Screening  Complete  Medication Review (RN CM)  Referral to Pharmacy  HRI or Home Care Consult Complete   Social Work Consult for Recovery Care Planning/Counseling Complete   Palliative Care Screening Complete   Medication Review Special Educational Needs Teacher) Referral to Pharmacy

## 2023-07-18 NOTE — Progress Notes (Signed)
 Shawn Bouchard, MD  Physician Physical Medicine and Rehabilitation   PMR Pre-admission     Signed   Date of Service: 07/17/2023  5:55 PM  Related encounter: ED to Hosp-Admission (Discharged) from 07/08/2023 in [facility]   Signed     Expand All Collapse All  Show:Clear all [x] Written[x] Templated[x] Copied  Added by: [x] Tae Robak, Heron MATSU, RN[x] Lovorn, Megan, MD  [] Hover for details PMR Admission Coordinator Pre-Admission Assessment   Patient: Shawn [name] is an 64 y.o., male MRN: [id number] DOB: 1960-11-26 Height: 5' 11 (180.3 cm) Weight: 65.9 kg   Insurance Information HMO:     PPO:      PCP:      IPA:      80/20:      OTHER:  PRIMARY: Medicare a and b      Policy#: [id number]      Subscriber: pt Benefits:  Phone #: passport one source online     Name: 1/8 Eff. Date: 04/08/2010     Deduct: $1676      Out of Pocket Max: none      Life Max: none CIR: 100%      SNF: 20 full days Outpatient: 80%     Co-Pay: 20% Home Health: 100%      Co-Pay: none DME: 80%     Co-Pay: 20% Providers: pt choice  SECONDARY: BCBS supplement      Policy#: [id number]     Phone#:    Financial Counselor:       Phone#:    The Data Processing Manager" for patients in Inpatient Rehabilitation Facilities with attached "Privacy Act Statement-Health Care Records" was provided and verbally reviewed with: Patient   Emergency Contact Information Contact Information       Name Relation Home Work Mobile    [place] Spouse [phone]   [phone]         Other Contacts       Name Relation Home Work [place] Sister [phone]         Current Medical History  Patient Admitting Diagnosis: Cervical discitis/osteomyelitis   History of Present Illness: 63 year ol male with history of ESRD , 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 No history of trauma.    MRI of the cervical spine showed possible discitis/osteomyelitis C6/C7, vertebral body height loss and retropulsion with severe, stenosis, compression of spinal cord. Neurosurgery consulted. Underwent ACDF for treatment of cervical discitis with pathological fracture on 07/15/23. ID consulted and placed on long term Vancomycin . End date of Vancomycin  08/26/23. Will be placed on chronic doxycycline  after IV antibiotic course. Postoperative pain management. Continue with cervical collar.   Nephrology consulted for on chronic hemodialysis. Continue insulin  regimen. On amlodipine  and Zestril  for HTN. On lipitor  and ASA for history of CVA. Complaints of constipation, does not urinate due to ESRD. Heparin  for DVT prophylaxis.   Patient's medical record from [facility] has been reviewed by the rehabilitation admission coordinator and physician.   Past Medical History      Past Medical History:  Diagnosis Date   Anemia     Arthritis      patient denies   Blind right eye     Complication of anesthesia     Diabetes mellitus      type II   ESRD on hemodialysis [facility])      Dialysis since  2011 MWF   HCAP (healthcare-associated pneumonia) 12/19/2022   Hypertension     Legally blind     No pertinent past medical history     PONV (postoperative nausea and vomiting)      N/V- became dehydrated had to have IV fluids- 01/2015   Shortness of breath dyspnea      when has too fluid   Stroke (HCC) 07/09/2010    date per patient   Syncope     Unspecified cerebral artery occlusion with cerebral infarction 04/02/2013        Has the patient had major surgery during 100 days prior to admission? Yes   Family History   family history includes Alzheimer's disease in his mother; Cancer in his father and sister; Diabetes in his mother; Heart disease in his father; Stroke in his sister.   Current Medications  Current Medications    Current  Facility-Administered Medications:    (feeding supplement) PROSource Plus liquid 30 mL, 30 mL, Oral, BID BM, Pool, Henry, MD, 30 mL at 07/17/23 1359   0.9 %  sodium chloride  infusion, 10 mL/hr, Intravenous, Once, Pool, Victory, MD   0.9 %  sodium chloride  infusion, 10 mL/hr, Intravenous, Once, Louis Victory, MD   acetaminophen  (TYLENOL ) tablet 650 mg, 650 mg, Oral, Q6H PRN, 650 mg at 07/13/23 2207 **OR** acetaminophen  (TYLENOL ) suppository 650 mg, 650 mg, Rectal, Q6H PRN, Louis Victory, MD   amLODipine  (NORVASC ) tablet 5 mg, 5 mg, Oral, Daily, Pool, Henry, MD, 5 mg at 07/17/23 1359   aspirin  EC tablet 325 mg, 325 mg, Oral, QHS, Pool, Victory, MD, 325 mg at 07/16/23 2237   atorvastatin  (LIPITOR ) tablet 80 mg, 80 mg, Oral, Daily, Pool, Victory, MD, 80 mg at 07/17/23 1359   brimonidine  (ALPHAGAN ) 0.2 % ophthalmic solution 1 drop, 1 drop, Both Eyes, BID, Pool, Victory, MD, 1 drop at 07/17/23 1358   calcitRIOL  (ROCALTROL ) capsule 0.5 mcg, 0.5 mcg, Oral, Q M,W,F, Pool, Victory, MD, 0.5 mcg at 07/17/23 1359   Chlorhexidine  Gluconate Cloth 2 % PADS 6 each, 6 each, Topical, Q0600, Louis Victory, MD, 6 each at 07/17/23 0547   cyclobenzaprine  (FLEXERIL ) tablet 10 mg, 10 mg, Oral, TID PRN, Louis Victory, MD, 10 mg at 07/16/23 0349   Darbepoetin Alfa  (ARANESP ) injection 60 mcg, 60 mcg, Subcutaneous, Q Wed-1800, Stovall, Kathryn R, PA-C   dorzolamide -timolol  (COSOPT ) 2-0.5 % ophthalmic solution 1 drop, 1 drop, Both Eyes, BID, Pool, Victory, MD, 1 drop at 07/17/23 1357   heparin  injection 5,000 Units, 5,000 Units, Subcutaneous, Q8H, Pool, Victory, MD, 5,000 Units at 07/17/23 1400   heparin  sodium (porcine) injection 3,800 Units, 3,800 Units, Intracatheter, Continuous PRN, Louis Victory, MD, 3,800 Units at 07/17/23 1220   HYDROcodone -acetaminophen  (NORCO) 10-325 MG per tablet 2 tablet, 2 tablet, Oral, Q4H PRN, Louis Victory, MD   HYDROcodone -acetaminophen  (NORCO/VICODIN) 5-325 MG per tablet 1 tablet, 1 tablet, Oral, Q4H PRN, Louis Victory,  MD, 1 tablet at 07/16/23 0155   HYDROmorphone  (DILAUDID ) injection 0.5 mg, 0.5 mg, Intravenous, Q6H PRN, Louis Victory, MD, 0.5 mg at 07/15/23 0756   insulin  aspart (novoLOG ) injection 0-9 Units, 0-9 Units, Subcutaneous, TID WC, Louis Victory, MD, 1 Units at 07/17/23 1358   insulin  glargine-yfgn (SEMGLEE ) injection 2 Units, 2 Units, Subcutaneous, QHS, Pool, Victory, MD, 2 Units at 07/16/23 2237   lanthanum  (FOSRENOL ) chewable tablet 1,000 mg, 1,000 mg, Oral, TID WC, Pool, Victory, MD, 1,000 mg at 07/17/23 1359   latanoprost  (XALATAN ) 0.005 % ophthalmic solution 1 drop, 1 drop, Both Eyes,  ARLEEN Louis Shove, MD, 1 drop at 07/16/23 2236   lisinopril  (ZESTRIL ) tablet 20 mg, 20 mg, Oral, BID, Louis Shove, MD, 20 mg at 07/17/23 1359   melatonin tablet 5 mg, 5 mg, Oral, QHS, Adhikari, Amrit, MD   menthol -cetylpyridinium (CEPACOL) lozenge 3 mg, 1 lozenge, Oral, PRN **OR** phenol (CHLORASEPTIC) mouth spray 1 spray, 1 spray, Mouth/Throat, PRN, Pool, Shove, MD   ondansetron  (ZOFRAN ) tablet 4 mg, 4 mg, Oral, Q6H PRN **OR** ondansetron  (ZOFRAN ) injection 4 mg, 4 mg, Intravenous, Q6H PRN, Louis Shove, MD, 4 mg at 07/15/23 2322   polyethylene glycol (MIRALAX  / GLYCOLAX ) packet 17 g, 17 g, Oral, Daily, Adhikari, Amrit, MD, 17 g at 07/17/23 1400   senna-docusate (Senokot-S) tablet 1 tablet, 1 tablet, Oral, BID, Adhikari, Amrit, MD, 1 tablet at 07/17/23 1359   sodium chloride  flush (NS) 0.9 % injection 3 mL, 3 mL, Intravenous, Q12H, Pool, Shove, MD, 3 mL at 07/17/23 1400   sodium chloride  flush (NS) 0.9 % injection 3 mL, 3 mL, Intravenous, PRN, Louis Shove, MD   vancomycin  (VANCOCIN ) 750 mg in sodium chloride  0.9 % 250 mL IVPB, 750 mg, Intravenous, Q M,W,F-HD, Louis Shove, MD, Stopped at 07/17/23 1220     Patients Current Diet:  Diet Order                  Diet regular Room service appropriate? Yes; Fluid consistency: Thin  Diet effective now                       Precautions /  Restrictions Precautions Precautions: Fall (Simultaneous filing. User may not have seen previous data.) Precaution Comments: pt blind; functional in own enviroment (Simultaneous filing. User may not have seen previous data.) Restrictions Weight Bearing Restrictions Per Provider Order: No    Has the patient had 2 or more falls or a fall with injury in the past year? No   Prior Activity Level Limited Community (1-2x/wk): Mod I with cane and RW in own environment; sponge bathes due to HD catheter   Prior Functional Level Self Care: Did the patient need help bathing, dressing, using the toilet or eating? Independent   Indoor Mobility: Did the patient need assistance with walking from room to room (with or without device)? Independent   Stairs: Did the patient need assistance with internal or external stairs (with or without device)? Needed some help   Functional Cognition: Did the patient need help planning regular tasks such as shopping or remembering to take medications? Needed some help   Patient Information Are you of Hispanic, Latino/a,or Spanish origin?: A. No, not of Hispanic, Latino/a, or Spanish origin What is your race?: B. Black or African American Do you need or want an interpreter to communicate with a doctor or health care staff?: 0. No   Patient's Response To:  Health Literacy and Transportation Is the patient able to respond to health literacy and transportation needs?: Yes Health Literacy - How often do you need to have someone help you when you read instructions, pamphlets, or other written material from your doctor or pharmacy?: Always In the past 12 months, has lack of transportation kept you from medical appointments or from getting medications?: No In the past 12 months, has lack of transportation kept you from meetings, work, or from getting things needed for daily living?: No   Journalist, Newspaper / Equipment Home Equipment: BSC/3in1, Medical Laboratory Scientific Officer - single point,  Rollator (4 wheels), Shower seat - built in Engineering Geologist.  User may not have seen previous data.)   Prior Device Use: Indicate devices/aids used by the patient prior to current illness, exacerbation or injury? Walker and cane   Current Functional Level Cognition   Overall Cognitive Status: Within Functional Limits for tasks assessed (Simultaneous filing. User may not have seen previous data.) Orientation Level: Oriented X4 General Comments: Pt manages in apartment well with vision loss. Requires assist outside apartment.    Extremity Assessment (includes Sensation/Coordination)   Upper Extremity Assessment: Right hand dominant, RUE deficits/detail (Simultaneous filing. User may not have seen previous data.) RUE Deficits / Details: AROM WFL. Strength: shoulder 4/5, biceps 5/5, triceps 3+/5, grip 4+/5 RUE Sensation: WNL RUE Coordination: decreased fine motor, decreased gross motor  Lower Extremity Assessment: Defer to PT evaluation (Simultaneous filing. User may not have seen previous data.)     ADLs   Overall ADL's : Needs assistance/impaired Eating/Feeding: Minimal assistance, Sitting Eating/Feeding Details (indicate cue type and reason): Pt is independent feeding at home after set up.  Feel pt will have greater ease feeding self up in chair. Grooming: Wash/dry hands, Wash/dry face, Oral care, Minimal assistance, Sitting Grooming Details (indicate cue type and reason): assist more for vision and less for fine motor assist. Upper Body Bathing: Set up, Sitting Lower Body Bathing: Moderate assistance, Sit to/from stand, Cueing for compensatory techniques Lower Body Bathing Details (indicate cue type and reason): pt limited by pain and inability to access B LEs at this time. Upper Body Dressing : Moderate assistance, Sitting Upper Body Dressing Details (indicate cue type and reason): Pt with pain and unable to get shirt over head or around back to dress UE without assist. Lower Body  Dressing: Maximal assistance, Sit to/from stand, Cueing for compensatory techniques Lower Body Dressing Details (indicate cue type and reason): Pain limiting pt from accessing LEs easily at this time. Pt normally crosses legs over but this is painful due to pulling legs up with BUEs. Toilet Transfer: Moderate assistance, Stand-pivot, BSC/3in1, Rolling walker (2 wheels) Toileting- Clothing Manipulation and Hygiene: Moderate assistance, Sit to/from stand, Cueing for compensatory techniques Functional mobility during ADLs: Moderate assistance, Rolling walker (2 wheels) General ADL Comments: Pt signficantly limited by pain and unsteadiness on feet during all mobility.     Mobility   Overal bed mobility: Needs Assistance (Simultaneous filing. User may not have seen previous data.) Bed Mobility: Rolling, Supine to Sit, Sit to Supine Rolling: Mod assist Supine to sit: Mod assist, Used rails Sit to supine: Mod assist, +2 for safety/equipment General bed mobility comments: Tactile cueing and hand over hand assist to find rail for roll R; heavy mod assist to come to sit; Heavy mod assist to help LEs back into bed and reposition back in supine     Transfers   Overall transfer level: Needs assistance Equipment used: Rolling walker (2 wheels) Transfers: Sit to/from Stand, Bed to chair/wheelchair/BSC Sit to Stand: Mod assist, From elevated surface Bed to/from chair/wheelchair/BSC transfer type:: Step pivot Step pivot transfers: Mod assist, From elevated surface General transfer comment: Pt required cues for hand placement and cues to bring calves off back of bed to pivot to chair.  Pt reports feeling unsteady on his feet but states this was his first time up to chair since surgery (although chair was prepared when therapist walked in) (Simultaneous filing. User may not have seen previous data.)     Ambulation / Gait / Stairs / Clinical Biochemist / Balance Dynamic  Sitting  Balance Sitting balance - Comments: Sat EOB for at least 10 minutes; Needing up to heavy mod assist for steadying and balance EOB Balance Overall balance assessment: Needs assistance (Simultaneous filing. User may not have seen previous data.) Sitting-balance support: Bilateral upper extremity supported Sitting balance-Leahy Scale: Poor Sitting balance - Comments: Sat EOB for at least 10 minutes; Needing up to heavy mod assist for steadying and balance EOB Standing balance support: Bilateral upper extremity supported, Reliant on assistive device for balance Standing balance-Leahy Scale: Poor Standing balance comment: pt with posterior lean pressing calves up against bed in standing to start     Special needs/care consideration OP hemodialysis [facility] MWF, uses [facility] transportation.Vancomycin  after HD sessions MWF for 6 weeks and then lifetime po antibiotics Complaints of constipation; does not urinate due to ESRD      Previous Home Environment  Living Arrangements: Spouse/significant other  Lives With: Spouse Available Help at Discharge: Family, Available 24 hours/day Type of Home: Apartment Home Layout: One level Home Access: Level entry Bathroom Shower/Tub: Walk-in shower, Other (comment), Sponge bathes at baseline Bathroom Toilet: Standard Bathroom Accessibility: Yes How Accessible: Accessible via walker Home Care Services: No   Discharge Living Setting Plans for Discharge Living Setting: Lives with (comment), Apartment (wife) Type of Home at Discharge: Apartment Discharge Home Layout: One level Discharge Home Access: Level entry Discharge Bathroom Shower/Tub: Walk-in shower Discharge Bathroom Toilet: Standard Discharge Bathroom Accessibility: Yes How Accessible: Accessible via walker Does the patient have any problems obtaining your medications?: No   Social/Family/Support Systems Patient Roles: Spouse Contact Information: wife, [place] Anticipated Caregiver:  wife Anticipated Caregiver's Contact Information: see contacts Ability/Limitations of Caregiver: no limitations Caregiver Availability: 24/7 Discharge Plan Discussed with Primary Caregiver: Yes Is Caregiver In Agreement with Plan?: Yes Does Caregiver/Family have Issues with Lodging/Transportation while Pt is in Rehab?: No   Goals Patient/Family Goal for Rehab: mod I to supervision wiht PT and OT Expected length of stay: ELOS 7 to 10 days Pt/Family Agrees to Admission and willing to participate: Yes Program Orientation Provided & Reviewed with Pt/Caregiver Including Roles  & Responsibilities: Yes Additional Information Needs: uses [employer] transportation to OP dialysis   Decrease burden of Care through IP rehab admission: n/a   Possible need for SNF placement upon discharge: not anticipated   Patient Condition: I have reviewed medical records from [facility], spoken with  patient. I met with patient at the bedside for inpatient rehabilitation assessment.  Patient will benefit from ongoing PT and OT, can actively participate in 3 hours of therapy a day 5 days of the week, and can make measurable gains during the admission.  Patient will also benefit from the coordinated team approach during an Inpatient Acute Rehabilitation admission.  The patient will receive intensive therapy as well as Rehabilitation physician, nursing, social worker, and care management interventions.  Due to bladder management, bowel management, safety, skin/wound care, disease management, medication administration, pain management, and patient education the patient requires 24 hour a day rehabilitation nursing.  The patient is currently mod to max assist with mobility and basic ADLs.  Discharge setting and therapy post discharge at home with home health is anticipated.  Patient has agreed to participate in the Acute Inpatient Rehabilitation Program and will admit today.   Preadmission Screen Completed By:  Alison Heron Lot, RN MSN 07/17/2023 5:55 PM ______________________________________________________________________   Discussed status with Dr. Lovorn on 07/18/23 at 1056 and received approval for admission today.   Admission Coordinator:  Alison Heron Lot, RN MSN time 1056 Date 07/18/23    Assessment/Plan: Diagnosis: Cervical osteomyelitis/discitis s/p cervical fusions Does the need for close, 24 hr/day Medical supervision in concert with the patient's rehab needs make it unreasonable for this patient to be served in a less intensive setting? 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 Yes Does the patient require coordinated care of a physician, rehab nurse, PT, OT, and SLP to address physical and functional deficits in the context of the above medical diagnosis(es)? Yes Addressing deficits in the following areas: balance, endurance, locomotion, strength, transferring, bowel/bladder control, bathing, dressing, feeding, grooming, and toileting Can the patient actively participate in an intensive therapy program of at least 3 hrs of therapy 5 days a week? Yes The potential for patient to make measurable gains while on inpatient rehab is good Anticipated functional outcomes upon discharge from inpatient rehab: modified independent and supervision PT, modified independent and supervision OT, n/a SLP Estimated rehab length of stay to reach the above functional goals is: 7-10 days Anticipated discharge destination: Home 10. Overall Rehab/Functional Prognosis: good     MD Signature:           Revision  History

## 2023-07-18 NOTE — H&P (Signed)
 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 He stated pain radiated down both arms. 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 CT C-spine showed severe erosion at the C6 and C7 with severe spinal canal stenosis at the C6-7 levels concerning for discitis/osteomyelitis. MRI C-spine and T-spine shows vertebral discitis/osteomyelitis at the C6-7 level, severe spinal canal stenosis at this level. Neurosurgery consulted>>extensive destructive process ongoing at C6 and C7 with vertebral collapse of C6 and C7 vertebral bodies. There is a central protrusion at the level of the C6-7 disc space causing moderately severe stenosis with some cord compression but no high signal abnormality. There was prevertebral phlegmon possibly consistent with osteomyelitis discitis. There was no evidence of significant epidural abscess. Started on empiric antibiotics and blood cultures positive for Staphylococcus epidermidis. ID consulted. Had attempt at catheter exchange in IR on 1/03 -unable to remove catheter d/t occluded SVC, no further access if removed. Taken to OR on 1/06 and underwent C5-T1 anterior cervical fusion utilizing interbody cage. Remains on IV vancomycin  on HD days though 08/26/2023. Aspen collar in place. Aspirin  325 mg daily. The patient requires inpatient medicine and rehabilitation evaluations and services for ongoing dysfunction secondary to due to cervical myelopathy and osteomyelitis.   Pt reports LBM was 1/1- last Tuesday-  Feels very constipated- has received miralax , and  suppository, but no sorbitol.    Pt also reports pain in neck not bad- but using IV Dilaudid , not PO- since works so well- explained we don't do IV pain meds in rehab- will need to take PO meds.    Denies muscle spasms.  Does feel stronger since had surgery on neck- initially said RUE didn't work well at all- much better.      Review of Systems  Constitutional: Negative.   HENT: Negative.    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   Cardiovascular:  Negative for chest pain and leg swelling.  Gastrointestinal:  Positive for constipation. Negative for abdominal pain and nausea.       Endorses constipation and dysphagia with solid food  Genitourinary:        States does not make urine- anuric  Musculoskeletal:  Positive for myalgias and neck pain.  Skin: Negative.   Neurological:  Positive for focal weakness and weakness. Negative for headaches.  Endo/Heme/Allergies: Negative.   Psychiatric/Behavioral:  The patient has insomnia.        Chronic poor sleep  All other systems reviewed and are negative.       Past Medical History:  Diagnosis Date   Anemia     Arthritis      patient denies   Blind right eye     Complication of anesthesia     Diabetes mellitus      type II   ESRD on hemodialysis [facility])      Dialysis since 2011 MWF   HCAP (healthcare-associated pneumonia) 12/19/2022   Hypertension     Legally blind     No pertinent past medical history  PONV (postoperative nausea and vomiting)      N/V- became dehydrated had to have IV fluids- 01/2015   Shortness of breath dyspnea      when has too fluid   Stroke (HCC) 07/09/2010    date per patient   Syncope     Unspecified cerebral artery occlusion with cerebral infarction 04/02/2013             Past Surgical History:  Procedure Laterality Date   AV FISTULA PLACEMENT Right 05/12/2013    Procedure: INSERTION OF ARTERIOVENOUS (AV) GORE-TEX GRAFT  ARM; ULTRASOUND GUIDED;  Surgeon: Redell LITTIE Door, MD;  Location: [facility] OR;  Service: Vascular;  Laterality: Right;   AV FISTULA PLACEMENT Left 01/18/2015    Procedure: INSERTION OF ARTERIOVENOUS GORE-TEX GRAFT LEFT UPPER ARM;  Surgeon: Redell LITTIE Door, MD;  Location: [facility] OR;  Service: Vascular;  Laterality: Left;   COLONOSCOPY   06/2012   EYE SURGERY Bilateral      retina surgery both eyes, cataract surgery both eyes   HERNIA REPAIR        right inguinal   INSERTION OF DIALYSIS CATHETER Right     IR FLUORO GUIDE CV LINE RIGHT   07/12/2023   IR PTA VENOUS EXCEPT DIALYSIS CIRCUIT   07/12/2023   left arm graft   10/2010   LIGATION ARTERIOVENOUS GORTEX GRAFT Left 05/03/2015    Procedure: LIGATION ARTERIOVENOUS GORTEX GRAFT-LEFT UPPER ARM;  Surgeon: Redell LITTIE Door, MD;  Location: [facility] OR;  Service: Vascular;  Laterality: Left;             Family History  Problem Relation Age of Onset   Diabetes Mother     Alzheimer's disease Mother     Cancer Father     Heart disease Father     Cancer Sister     Stroke Sister          Social History:  reports that he has never smoked. He has never used smokeless tobacco. He reports that he does not drink alcohol  and does not use drugs. Allergies:  Allergies  No Known Allergies         Medications Prior to Admission  Medication Sig Dispense Refill   acetaminophen  (TYLENOL ) 500 MG tablet Take 1,000 mg by mouth once as needed.       amLODipine  (NORVASC ) 5 MG tablet Take 5 mg by mouth daily.       aspirin  EC 325 MG tablet Take 325 mg by mouth at bedtime.       b complex vitamins tablet Take 1 tablet by mouth at bedtime.       brimonidine  (ALPHAGAN ) 0.2 % ophthalmic solution Place 1 drop into both eyes 2 (two) times daily.       calcitRIOL  (ROCALTROL ) 0.5 MCG capsule Take 1 capsule (0.5 mcg total) by mouth 3 (three) times a week. (Patient taking differently: Take 0.5 mcg by mouth every Monday, Wednesday, and Friday.) 30 capsule 0   Darbepoetin Alfa  (ARANESP ) 100  MCG/0.5ML SOSY injection Inject 0.5 mLs (100 mcg total) into the skin every Friday at 6 PM. 4.2 mL 0   dorzolamide -timolol  (COSOPT ) 22.3-6.8 MG/ML ophthalmic solution Place 1 drop into both eyes 2 (two) times daily.       lanthanum  (FOSRENOL ) 1000 MG chewable tablet Chew 1,000 mg by mouth 3 (three) times daily with meals.       latanoprost  (XALATAN ) 0.005 % ophthalmic solution Place 1 drop into both eyes at bedtime.  lisinopril  (ZESTRIL ) 20 MG tablet Take 20 mg by mouth 2 (two) times daily.       Methoxy PEG-Epoetin  Beta (MIRCERA IJ) Inject 1 Dose into the skin every 14 (fourteen) days.       rosuvastatin  (CRESTOR ) 40 MG tablet Take 40 mg by mouth daily.       TRESIBA  FLEXTOUCH 100 UNIT/ML FlexTouch Pen Inject 12 Units into the skin at bedtime.                  Home: Home Living Family/patient expects to be discharged to:: Private residence (Simultaneous filing. User may not have seen previous data.) Living Arrangements: Spouse/significant other Available Help at Discharge: Family, Available 24 hours/day Type of Home: Apartment Home Access: Level entry Home Layout: One level Bathroom Shower/Tub: Walk-in shower, Other (comment), Sponge bathes at baseline Bathroom Toilet: Standard Bathroom Accessibility: Yes Home Equipment: BSC/3in1, Cane - single point, Rollator (4 wheels), Shower seat - built in (Designer, industrial/product. User may not have seen previous data.)  Lives With: Spouse   Functional History: Prior Function Prior Level of Function : Needs assist (Simultaneous filing. User may not have seen previous data.) Physical Assist : Mobility (physical), ADLs (physical) Mobility (physical): Stairs (uses cane inside and rolling walker outside) ADLs (physical): IADLs Mobility Comments: SPC in the house, RW in the community and for dialysis (Simultaneous filing. User may not have seen previous data.) ADLs Comments: Reports Indep, Wife does all IADLs for pt. (Simultaneous filing. User  may not have seen previous data.)   Functional Status:  Mobility: Bed Mobility Overal bed mobility: Needs Assistance (Simultaneous filing. User may not have seen previous data.) Bed Mobility: Rolling, Supine to Sit, Sit to Supine Rolling: Mod assist Supine to sit: Mod assist, Used rails Sit to supine: Mod assist, +2 for safety/equipment General bed mobility comments: Tactile cueing and hand over hand assist to find rail for roll R; heavy mod assist to come to sit; Heavy mod assist to help LEs back into bed and reposition back in supine Transfers Overall transfer level: Needs assistance Equipment used: Rolling walker (2 wheels) Transfers: Sit to/from Stand, Bed to chair/wheelchair/BSC Sit to Stand: Mod assist, From elevated surface Bed to/from chair/wheelchair/BSC transfer type:: Step pivot Step pivot transfers: Mod assist, From elevated surface General transfer comment: Pt required cues for hand placement and cues to bring calves off back of bed to pivot to chair.  Pt reports feeling unsteady on his feet but states this was his first time up to chair since surgery (although chair was prepared when therapist walked in) (Simultaneous filing. User may not have seen previous data.)   ADL: ADL Overall ADL's : Needs assistance/impaired Eating/Feeding: Set up, Supervision/ safety, Bed level Eating/Feeding Details (indicate cue type and reason): Once pt got positioned correctly in bed and tray set up for him with orientaion explained he can feed himself. Does do better with finger foods and cups with straws. Grooming: Wash/dry hands, Wash/dry face, Oral care, Minimal assistance, Sitting Grooming Details (indicate cue type and reason): assist more for vision and less for fine motor assist. Upper Body Bathing: Set up, Sitting Lower Body Bathing: Moderate assistance, Sit to/from stand, Cueing for compensatory techniques Lower Body Bathing Details (indicate cue type and reason): pt limited by pain  and inability to access B LEs at this time. Upper Body Dressing : Moderate assistance, Sitting Upper Body Dressing Details (indicate cue type and reason): Pt with pain and unable to get shirt over head or around back to  dress UE without assist. Lower Body Dressing: Maximal assistance, Sit to/from stand, Cueing for compensatory techniques Lower Body Dressing Details (indicate cue type and reason): Pain limiting pt from accessing LEs easily at this time. Pt normally crosses legs over but this is painful due to pulling legs up with BUEs. Toilet Transfer: Moderate assistance, Stand-pivot, BSC/3in1, Rolling walker (2 wheels) Toileting- Clothing Manipulation and Hygiene: Moderate assistance, Sit to/from stand, Cueing for compensatory techniques Functional mobility during ADLs: Moderate assistance, Rolling walker (2 wheels) General ADL Comments: Pt signficantly limited by pain and unsteadiness on feet during all mobility.   Cognition: Cognition Overall Cognitive Status: Within Functional Limits for tasks assessed Orientation Level: Oriented X4 Cognition Arousal: Alert Behavior During Therapy: WFL for tasks assessed/performed Overall Cognitive Status: Within Functional Limits for tasks assessed General Comments: Pt manages in apartment well with vision loss. Requires assist outside apartment.   Physical Exam: Blood pressure (!) 145/63, pulse 80, temperature 97.6 F (36.4 C), temperature source Oral, resp. rate 17, height 5' 11 (1.803 m), weight 65.9 kg, SpO2 100%. Physical Exam Vitals and nursing note reviewed.  Constitutional:      General: He is not in acute distress.    Appearance: Normal appearance. He is normal weight.     Comments: Awake, alert, appropriate, on bedpan; eyes closed the whole time, NAD  HENT:     Head: Normocephalic and atraumatic.     Comments: Aspen collar in place    Right Ear: External ear normal.     Left Ear: External ear normal.     Nose: Nose normal. No  congestion.     Mouth/Throat:     Mouth: Mucous membranes are dry.     Pharynx: Oropharynx is clear. No oropharyngeal exudate.  Eyes:     General:        Right eye: No discharge.        Left eye: No discharge.     Comments: Cannot see, so keeps eyes closed  Neck:     Comments: Anterior and posterior incisions- C/D/I incision with Cervical collar in place Cardiovascular:     Rate and Rhythm: Normal rate and regular rhythm.     Heart sounds: Normal heart sounds. No murmur heard.    No gallop.  Pulmonary:     Effort: Pulmonary effort is normal. No respiratory distress.     Breath sounds: Normal breath sounds. No wheezing, rhonchi or rales.  Abdominal:     General: Abdomen is flat. There is no distension.     Tenderness: There is no abdominal tenderness.     Comments: Very hypoactive BS  Musculoskeletal:     Right lower leg: No edema.     Left lower leg: No edema.     Comments: RUE- biceps 4/5; Triceps 4-/5; WE 4+/5; Grip 4/5; and FA 3+/5 LUE- biceps 4+/5; triceps 4+/5; WE 4+/5; 4+ throughout RLE- 4-/5 throughout LLE- 4/5 throughout  Skin:    General: Skin is warm and dry.     Comments: Some ecchymoses on legs that's healing and arms- B/L forearm IVs- look OK   Neurological:     Mental Status: He is alert and oriented to person, place, and time.     Comments: Decreased to light touch in stocking pattern B/L  to knees Otherwise intact to light touch x4 2 beats clonus in B/L LE's- absent DTRs in LE's and 1+ in Ue's No hoffman's B/L   Psychiatric:        Mood and Affect: Mood  normal.        Behavior: Behavior normal.        Lab Results Last 48 Hours        Results for orders placed or performed during the hospital encounter of 07/08/23 (from the past 48 hours)  Glucose, capillary     Status: Abnormal    Collection Time: 07/16/23 11:38 AM  Result Value Ref Range    Glucose-Capillary 175 (H) 70 - 99 mg/dL      Comment: Glucose reference range applies only to samples taken  after fasting for at least 8 hours.  Glucose, capillary     Status: Abnormal    Collection Time: 07/16/23  4:06 PM  Result Value Ref Range    Glucose-Capillary 241 (H) 70 - 99 mg/dL      Comment: Glucose reference range applies only to samples taken after fasting for at least 8 hours.  Glucose, capillary     Status: Abnormal    Collection Time: 07/16/23  8:14 PM  Result Value Ref Range    Glucose-Capillary 245 (H) 70 - 99 mg/dL      Comment: Glucose reference range applies only to samples taken after fasting for at least 8 hours.  Basic metabolic panel     Status: Abnormal    Collection Time: 07/17/23  4:32 AM  Result Value Ref Range    Sodium 130 (L) 135 - 145 mmol/L    Potassium 4.4 3.5 - 5.1 mmol/L    Chloride 93 (L) 98 - 111 mmol/L    CO2 24 22 - 32 mmol/L    Glucose, Bld 258 (H) 70 - 99 mg/dL      Comment: Glucose reference range applies only to samples taken after fasting for at least 8 hours.    BUN 30 (H) 8 - 23 mg/dL    Creatinine, Ser 3.63 (H) 0.61 - 1.24 mg/dL    Calcium  8.9 8.9 - 10.3 mg/dL    GFR, Estimated 9 (L) >60 mL/min      Comment: (NOTE) Calculated using the CKD-EPI Creatinine Equation (2021)      Anion gap 13 5 - 15      Comment: Performed at [facility], [address] [place], [place] [address]  CBC with Differential/Platelet     Status: Abnormal    Collection Time: 07/17/23  4:32 AM  Result Value Ref Range    WBC 9.3 4.0 - 10.5 K/uL    RBC 3.45 (L) 4.22 - 5.81 MIL/uL    Hemoglobin 9.2 (L) 13.0 - 17.0 g/dL    HCT 71.0 (L) 60.9 - 52.0 %    MCV 83.8 80.0 - 100.0 fL    MCH 26.7 26.0 - 34.0 pg    MCHC 31.8 30.0 - 36.0 g/dL    RDW 83.7 (H) 88.4 - 15.5 %    Platelets 341 150 - 400 K/uL    nRBC 0.0 0.0 - 0.2 %    Neutrophils Relative % 72 %    Neutro Abs 6.8 1.7 - 7.7 K/uL    Lymphocytes Relative 10 %    Lymphs Abs 0.9 0.7 - 4.0 K/uL    Monocytes Relative 14 %    Monocytes Absolute 1.3 (H) 0.1 - 1.0 K/uL    Eosinophils Relative 3 %     Eosinophils Absolute 0.2 0.0 - 0.5 K/uL    Basophils Relative 0 %    Basophils Absolute 0.0 0.0 - 0.1 K/uL    Immature Granulocytes 1 %  Abs Immature Granulocytes 0.05 0.00 - 0.07 K/uL      Comment: Performed at [facility], [address], [place], [place] [address]  Vancomycin , random     Status: None    Collection Time: 07/17/23  4:32 AM  Result Value Ref Range    Vancomycin  Rm 25 ug/mL      Comment:        Random Vancomycin  therapeutic range is dependent on dosage and time of specimen collection. A peak range is 20-40 ug/mL A trough range is 5-15 ug/mL        Performed at [facility], [address] [place], [place] [address]    HCV Ab w Reflex to Quant PCR     Status: None    Collection Time: 07/17/23  4:32 AM  Result Value Ref Range    HCV Ab Non Reactive Non Reactive      Comment: (NOTE) Performed At: [facility] [address] [place], [place] [id number] Jennette Shorter MD Ey:[id number]    Hepatitis A antibody, total     Status: None    Collection Time: 07/17/23  4:32 AM  Result Value Ref Range    hep A Total Ab NON REACTIVE NON REACTIVE      Comment: Performed at [facility], [address] [place], [place] [address]  Interpretation:     Status: None    Collection Time: 07/17/23  4:32 AM  Result Value Ref Range    HCV Interp 1: Comment        Comment: (NOTE) Not infected with HCV unless early or acute infection is suspected (which may be delayed in an immunocompromised individual), or other evidence exists to indicate HCV infection. Performed At: [facility] [address] [place], [place] [id number] Jennette Shorter MD Ey:[id number]    Glucose, capillary     Status: Abnormal    Collection Time: 07/17/23  7:18 AM  Result Value Ref Range    Glucose-Capillary 244 (H) 70 - 99 mg/dL      Comment: Glucose reference range applies only to samples taken after fasting for at least 8 hours.  Glucose, capillary     Status:  Abnormal    Collection Time: 07/17/23  1:21 PM  Result Value Ref Range    Glucose-Capillary 130 (H) 70 - 99 mg/dL      Comment: Glucose reference range applies only to samples taken after fasting for at least 8 hours.  Glucose, capillary     Status: Abnormal    Collection Time: 07/17/23  4:30 PM  Result Value Ref Range    Glucose-Capillary 118 (H) 70 - 99 mg/dL      Comment: Glucose reference range applies only to samples taken after fasting for at least 8 hours.  Glucose, capillary     Status: Abnormal    Collection Time: 07/17/23  9:39 PM  Result Value Ref Range    Glucose-Capillary 125 (H) 70 - 99 mg/dL      Comment: Glucose reference range applies only to samples taken after fasting for at least 8 hours.  CBC with Differential/Platelet     Status: Abnormal    Collection Time: 07/18/23  5:48 AM  Result Value Ref Range    WBC 7.6 4.0 - 10.5 K/uL    RBC 3.31 (L) 4.22 - 5.81 MIL/uL    Hemoglobin 9.0 (L) 13.0 - 17.0 g/dL    HCT 72.2 (L) 60.9 - 52.0 %    MCV 83.7  80.0 - 100.0 fL    MCH 27.2 26.0 - 34.0 pg    MCHC 32.5 30.0 - 36.0 g/dL    RDW 83.8 (H) 88.4 - 15.5 %    Platelets 250 150 - 400 K/uL    nRBC 0.0 0.0 - 0.2 %    Neutrophils Relative % 68 %    Neutro Abs 5.2 1.7 - 7.7 K/uL    Lymphocytes Relative 12 %    Lymphs Abs 0.9 0.7 - 4.0 K/uL    Monocytes Relative 10 %    Monocytes Absolute 0.8 0.1 - 1.0 K/uL    Eosinophils Relative 9 %    Eosinophils Absolute 0.6 (H) 0.0 - 0.5 K/uL    Basophils Relative 1 %    Basophils Absolute 0.1 0.0 - 0.1 K/uL    Immature Granulocytes 0 %    Abs Immature Granulocytes 0.02 0.00 - 0.07 K/uL      Comment: Performed at [facility], [address], [place], [place] [address]  Basic metabolic panel     Status: Abnormal    Collection Time: 07/18/23  5:49 AM  Result Value Ref Range    Sodium 131 (L) 135 - 145 mmol/L    Potassium 4.0 3.5 - 5.1 mmol/L    Chloride 96 (L) 98 - 111 mmol/L    CO2 26 22 - 32 mmol/L    Glucose, Bld 145 (H)  70 - 99 mg/dL      Comment: Glucose reference range applies only to samples taken after fasting for at least 8 hours.    BUN 15 8 - 23 mg/dL    Creatinine, Ser 5.70 (H) 0.61 - 1.24 mg/dL    Calcium  8.6 (L) 8.9 - 10.3 mg/dL    GFR, Estimated 15 (L) >60 mL/min      Comment: (NOTE) Calculated using the CKD-EPI Creatinine Equation (2021)      Anion gap 9 5 - 15      Comment: Performed at [facility], [address] [place], [place] [address]  Glucose, capillary     Status: Abnormal    Collection Time: 07/18/23  7:27 AM  Result Value Ref Range    Glucose-Capillary 129 (H) 70 - 99 mg/dL      Comment: Glucose reference range applies only to samples taken after fasting for at least 8 hours.      Imaging Results (Last 48 hours)  No results found.         Blood pressure (!) 145/63, pulse 80, temperature 97.6 F (36.4 C), temperature source Oral, resp. rate 17, height 5' 11 (1.803 m), weight 65.9 kg, SpO2 100%.   Medical Problem List and Plan: 1. 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  Antithrombotics: -DVT/anticoagulation:  Pharmaceutical: Heparin              -antiplatelet therapy: continue aspirin  325 mg daily   3. Pain Management:  -Tylenol  as needed -Norco as needed 5-10 mg q4 hours- might need Oxy if not enough- was taking IV Dilaudid  solely on Acute.              -Flexeril  10  mg TID as needed   4. Mood/Behavior/Sleep: LCSW to evaluate and provide emotional support             -continue melatonin 5 mg q HS             -antipsychotic agents: n/a   5. Neuropsych/cognition: This patient is capable of making decisions on his own behalf.   6. Skin/Wound Care: Routine skin care checks             -monitor surgical incisions   7.  Fluids/Electrolytes/Nutrition: Strict Is and Os and follow-up chemistries per nephrology   8: Hypertension: monitor TID and prn             -continue amlodipine  5 mg daily             -continue lisinopril  20 mg BID   9: Hyperlipidemia: continue statin   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 , Cosopt , Xalantan   11: ESRD: HD>>M,W,F via right internal jugular TDC             -continue calcitrol 0.5 mcg M,W,F             -continue Aranesp  60 mcg every Wednesday             -continue Fosrenol  1,000 mg TID with meals   12: DM-2: A1c = 5.8%; at home on Tresiba  Flextouch 12u at bedtime             -continue SSI             -continue Semglee  2 units q HS   13: Constipation: continue MiraLax  daily, Senokot-S BID; prns ordered   14: Discitis/osteomyelitis with severe stenosis and myelopathy:  -continue vancomycin  750 mg with HD through 08/26/2023- will likely change to Chronic Doxycycline  after that, but need to reach out to ID when close to d/c to verify             -follow-up with ID   15: C6, 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 Louis   16: History of prior CVA: on aspirin  and statin   17. Neurogenic bowel- LBM 9 days ago- will order Sorbitol to get him to go.  If doesn't work, will need soap suds enema, etc- might need KUB   @Sandra  J Setzer, PA-C 07/18/2023     I have personally performed a face to face diagnostic evaluation of this patient and formulated the key components of the plan.  Additionally, I have personally reviewed laboratory data, imaging studies, as well as relevant notes and concur with the physician assistant's documentation above.   The patient's status has not changed from the  original H&P.  Any changes in documentation from the acute care chart have been noted above.

## 2023-07-18 NOTE — H&P (Signed)
 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 He stated pain radiated down both arms. 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 CT C-spine showed severe erosion at the C6 and C7 with severe spinal canal stenosis at the C6-7 levels concerning for discitis/osteomyelitis. MRI C-spine and T-spine shows vertebral discitis/osteomyelitis at the C6-7 level, severe spinal canal stenosis at this level. Neurosurgery consulted>>extensive destructive process ongoing at C6 and C7 with vertebral collapse of C6 and C7 vertebral bodies. There is a central protrusion at the level of the C6-7 disc space causing moderately severe stenosis with some cord compression but no high signal abnormality. There was prevertebral phlegmon possibly consistent with osteomyelitis discitis. There was no evidence of significant epidural abscess. Started on empiric antibiotics and blood cultures positive for Staphylococcus epidermidis. ID consulted. Had attempt at catheter exchange in IR on 1/03 -unable to remove catheter d/t occluded SVC, no further access if removed. Taken to OR on 1/06 and underwent C5-T1 anterior cervical fusion utilizing interbody cage. Remains on IV vancomycin  on HD days though 08/26/2023. Aspen collar in place. Aspirin  325 mg daily. The patient requires inpatient medicine and rehabilitation evaluations and services for ongoing dysfunction secondary to due to cervical myelopathy and osteomyelitis.   Pt reports LBM was 1/1- last Tuesday-  Feels very constipated- has received miralax , and  suppository, but no sorbitol.    Pt also reports pain in neck not bad- but using IV Dilaudid , not PO- since works so well- explained we don't do IV pain meds in rehab- will need to take PO meds.    Denies muscle spasms.  Does feel stronger since had surgery on neck- initially said RUE didn't work well at all- much better.      Review of Systems  Constitutional: Negative.   HENT: Negative.    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   Cardiovascular:  Negative for chest pain and leg swelling.  Gastrointestinal:  Positive for constipation. Negative for abdominal pain and nausea.       Endorses constipation and dysphagia with solid food  Genitourinary:        States does not make urine- anuric  Musculoskeletal:  Positive for myalgias and neck pain.  Skin: Negative.   Neurological:  Positive for focal weakness and weakness. Negative for headaches.  Endo/Heme/Allergies: Negative.   Psychiatric/Behavioral:  The patient has insomnia.        Chronic poor sleep  All other systems reviewed and are negative.             Past Medical History:  Diagnosis Date   Anemia     Arthritis      patient denies   Blind right eye     Complication of anesthesia     Diabetes mellitus      type II   ESRD on hemodialysis [facility])      Dialysis since 2011 MWF   HCAP (healthcare-associated pneumonia) 12/19/2022   Hypertension     Legally blind  No pertinent past medical history     PONV (postoperative nausea and vomiting)      N/V- became dehydrated had to have IV fluids- 01/2015   Shortness of breath dyspnea      when has too fluid   Stroke (HCC) 07/09/2010    date per patient   Syncope     Unspecified cerebral artery occlusion with cerebral infarction 04/02/2013                      Past Surgical History:  Procedure Laterality Date   AV FISTULA PLACEMENT Right 05/12/2013    Procedure: INSERTION OF ARTERIOVENOUS (AV)  GORE-TEX GRAFT ARM; ULTRASOUND GUIDED;  Surgeon: Redell LITTIE Door, MD;  Location: [facility] OR;  Service: Vascular;  Laterality: Right;   AV FISTULA PLACEMENT Left 01/18/2015    Procedure: INSERTION OF ARTERIOVENOUS GORE-TEX GRAFT LEFT UPPER ARM;  Surgeon: Redell LITTIE Door, MD;  Location: [facility] OR;  Service: Vascular;  Laterality: Left;   COLONOSCOPY   06/2012   EYE SURGERY Bilateral      retina surgery both eyes, cataract surgery both eyes   HERNIA REPAIR        right inguinal   INSERTION OF DIALYSIS CATHETER Right     IR FLUORO GUIDE CV LINE RIGHT   07/12/2023   IR PTA VENOUS EXCEPT DIALYSIS CIRCUIT   07/12/2023   left arm graft   10/2010   LIGATION ARTERIOVENOUS GORTEX GRAFT Left 05/03/2015    Procedure: LIGATION ARTERIOVENOUS GORTEX GRAFT-LEFT UPPER ARM;  Surgeon: Redell LITTIE Door, MD;  Location: [facility] OR;  Service: Vascular;  Laterality: Left;                      Family History  Problem Relation Age of Onset   Diabetes Mother     Alzheimer's disease Mother     Cancer Father     Heart disease Father     Cancer Sister     Stroke Sister            Social History:  reports that he has never smoked. He has never used smokeless tobacco. He reports that he does not drink alcohol  and does not use drugs. Allergies:  Allergies  No Known Allergies                  Medications Prior to Admission  Medication Sig Dispense Refill   acetaminophen  (TYLENOL ) 500 MG tablet Take 1,000 mg by mouth once as needed.       amLODipine  (NORVASC ) 5 MG tablet Take 5 mg by mouth daily.       aspirin  EC 325 MG tablet Take 325 mg by mouth at bedtime.       b complex vitamins tablet Take 1 tablet by mouth at bedtime.       brimonidine  (ALPHAGAN ) 0.2 % ophthalmic solution Place 1 drop into both eyes 2 (two) times daily.       calcitRIOL  (ROCALTROL ) 0.5 MCG capsule Take 1 capsule (0.5 mcg total) by mouth 3 (three) times a week. (Patient taking differently: Take 0.5 mcg by mouth every Monday, Wednesday, and Friday.) 30 capsule 0    Darbepoetin Alfa  (ARANESP ) 100 MCG/0.5ML SOSY injection Inject 0.5 mLs (100 mcg total) into the skin every Friday at 6 PM. 4.2 mL 0   dorzolamide -timolol  (COSOPT ) 22.3-6.8 MG/ML ophthalmic solution Place 1 drop into both eyes 2 (two) times daily.       lanthanum  (FOSRENOL ) 1000 MG  chewable tablet Chew 1,000 mg by mouth 3 (three) times daily with meals.       latanoprost  (XALATAN ) 0.005 % ophthalmic solution Place 1 drop into both eyes at bedtime.        lisinopril  (ZESTRIL ) 20 MG tablet Take 20 mg by mouth 2 (two) times daily.       Methoxy PEG-Epoetin  Beta (MIRCERA IJ) Inject 1 Dose into the skin every 14 (fourteen) days.       rosuvastatin  (CRESTOR ) 40 MG tablet Take 40 mg by mouth daily.       TRESIBA  FLEXTOUCH 100 UNIT/ML FlexTouch Pen Inject 12 Units into the skin at bedtime.                    Home: Home Living Family/patient expects to be discharged to:: Private residence (Simultaneous filing. User may not have seen previous data.) Living Arrangements: Spouse/significant other Available Help at Discharge: Family, Available 24 hours/day Type of Home: Apartment Home Access: Level entry Home Layout: One level Bathroom Shower/Tub: Walk-in shower, Other (comment), Sponge bathes at baseline Bathroom Toilet: Standard Bathroom Accessibility: Yes Home Equipment: BSC/3in1, Cane - single point, Rollator (4 wheels), Shower seat - built in (Designer, industrial/product. User may not have seen previous data.)  Lives With: Spouse   Functional History: Prior Function Prior Level of Function : Needs assist (Simultaneous filing. User may not have seen previous data.) Physical Assist : Mobility (physical), ADLs (physical) Mobility (physical): Stairs (uses cane inside and rolling walker outside) ADLs (physical): IADLs Mobility Comments: SPC in the house, RW in the community and for dialysis (Simultaneous filing. User may not have seen previous data.) ADLs Comments: Reports Indep, Wife does all IADLs  for pt. (Simultaneous filing. User may not have seen previous data.)   Functional Status:  Mobility: Bed Mobility Overal bed mobility: Needs Assistance (Simultaneous filing. User may not have seen previous data.) Bed Mobility: Rolling, Supine to Sit, Sit to Supine Rolling: Mod assist Supine to sit: Mod assist, Used rails Sit to supine: Mod assist, +2 for safety/equipment General bed mobility comments: Tactile cueing and hand over hand assist to find rail for roll R; heavy mod assist to come to sit; Heavy mod assist to help LEs back into bed and reposition back in supine Transfers Overall transfer level: Needs assistance Equipment used: Rolling walker (2 wheels) Transfers: Sit to/from Stand, Bed to chair/wheelchair/BSC Sit to Stand: Mod assist, From elevated surface Bed to/from chair/wheelchair/BSC transfer type:: Step pivot Step pivot transfers: Mod assist, From elevated surface General transfer comment: Pt required cues for hand placement and cues to bring calves off back of bed to pivot to chair.  Pt reports feeling unsteady on his feet but states this was his first time up to chair since surgery (although chair was prepared when therapist walked in) (Simultaneous filing. User may not have seen previous data.)   ADL: ADL Overall ADL's : Needs assistance/impaired Eating/Feeding: Set up, Supervision/ safety, Bed level Eating/Feeding Details (indicate cue type and reason): Once pt got positioned correctly in bed and tray set up for him with orientaion explained he can feed himself. Does do better with finger foods and cups with straws. Grooming: Wash/dry hands, Wash/dry face, Oral care, Minimal assistance, Sitting Grooming Details (indicate cue type and reason): assist more for vision and less for fine motor assist. Upper Body Bathing: Set up, Sitting Lower Body Bathing: Moderate assistance, Sit to/from stand, Cueing for compensatory techniques Lower Body Bathing Details (indicate cue  type and reason): pt  limited by pain and inability to access B LEs at this time. Upper Body Dressing : Moderate assistance, Sitting Upper Body Dressing Details (indicate cue type and reason): Pt with pain and unable to get shirt over head or around back to dress UE without assist. Lower Body Dressing: Maximal assistance, Sit to/from stand, Cueing for compensatory techniques Lower Body Dressing Details (indicate cue type and reason): Pain limiting pt from accessing LEs easily at this time. Pt normally crosses legs over but this is painful due to pulling legs up with BUEs. Toilet Transfer: Moderate assistance, Stand-pivot, BSC/3in1, Rolling walker (2 wheels) Toileting- Clothing Manipulation and Hygiene: Moderate assistance, Sit to/from stand, Cueing for compensatory techniques Functional mobility during ADLs: Moderate assistance, Rolling walker (2 wheels) General ADL Comments: Pt signficantly limited by pain and unsteadiness on feet during all mobility.   Cognition: Cognition Overall Cognitive Status: Within Functional Limits for tasks assessed Orientation Level: Oriented X4 Cognition Arousal: Alert Behavior During Therapy: WFL for tasks assessed/performed Overall Cognitive Status: Within Functional Limits for tasks assessed General Comments: Pt manages in apartment well with vision loss. Requires assist outside apartment.   Physical Exam: Blood pressure (!) 145/63, pulse 80, temperature 97.6 F (36.4 C), temperature source Oral, resp. rate 17, height 5' 11 (1.803 m), weight 65.9 kg, SpO2 100%. Physical Exam Vitals and nursing note reviewed.  Constitutional:      General: He is not in acute distress.    Appearance: Normal appearance. He is normal weight.     Comments: Awake, alert, appropriate, on bedpan; eyes closed the whole time, NAD  HENT:     Head: Normocephalic and atraumatic.     Comments: Aspen collar in place    Right Ear: External ear normal.     Left Ear: External ear  normal.     Nose: Nose normal. No congestion.     Mouth/Throat:     Mouth: Mucous membranes are dry.     Pharynx: Oropharynx is clear. No oropharyngeal exudate.  Eyes:     General:        Right eye: No discharge.        Left eye: No discharge.     Comments: Cannot see, so keeps eyes closed  Neck:     Comments: Anterior and posterior incisions- C/D/I incision with Cervical collar in place Cardiovascular:     Rate and Rhythm: Normal rate and regular rhythm.     Heart sounds: Normal heart sounds. No murmur heard.    No gallop.  Pulmonary:     Effort: Pulmonary effort is normal. No respiratory distress.     Breath sounds: Normal breath sounds. No wheezing, rhonchi or rales.  Abdominal:     General: Abdomen is flat. There is no distension.     Tenderness: There is no abdominal tenderness.     Comments: Very hypoactive BS  Musculoskeletal:     Right lower leg: No edema.     Left lower leg: No edema.     Comments: RUE- biceps 4/5; Triceps 4-/5; WE 4+/5; Grip 4/5; and FA 3+/5 LUE- biceps 4+/5; triceps 4+/5; WE 4+/5; 4+ throughout RLE- 4-/5 throughout LLE- 4/5 throughout  Skin:    General: Skin is warm and dry.     Comments: Some ecchymoses on legs that's healing and arms- B/L forearm IVs- look OK   Neurological:     Mental Status: He is alert and oriented to person, place, and time.     Comments: Decreased to light touch in stocking  pattern B/L  to knees Otherwise intact to light touch x4 2 beats clonus in B/L LE's- absent DTRs in LE's and 1+ in Ue's No hoffman's B/L   Psychiatric:        Mood and Affect: Mood normal.        Behavior: Behavior normal.        Lab Results Last 48 Hours             Results for orders placed or performed during the hospital encounter of 07/08/23 (from the past 48 hours)  Glucose, capillary     Status: Abnormal    Collection Time: 07/16/23 11:38 AM  Result Value Ref Range    Glucose-Capillary 175 (H) 70 - 99 mg/dL      Comment: Glucose  reference range applies only to samples taken after fasting for at least 8 hours.  Glucose, capillary     Status: Abnormal    Collection Time: 07/16/23  4:06 PM  Result Value Ref Range    Glucose-Capillary 241 (H) 70 - 99 mg/dL      Comment: Glucose reference range applies only to samples taken after fasting for at least 8 hours.  Glucose, capillary     Status: Abnormal    Collection Time: 07/16/23  8:14 PM  Result Value Ref Range    Glucose-Capillary 245 (H) 70 - 99 mg/dL      Comment: Glucose reference range applies only to samples taken after fasting for at least 8 hours.  Basic metabolic panel     Status: Abnormal    Collection Time: 07/17/23  4:32 AM  Result Value Ref Range    Sodium 130 (L) 135 - 145 mmol/L    Potassium 4.4 3.5 - 5.1 mmol/L    Chloride 93 (L) 98 - 111 mmol/L    CO2 24 22 - 32 mmol/L    Glucose, Bld 258 (H) 70 - 99 mg/dL      Comment: Glucose reference range applies only to samples taken after fasting for at least 8 hours.    BUN 30 (H) 8 - 23 mg/dL    Creatinine, Ser 3.63 (H) 0.61 - 1.24 mg/dL    Calcium  8.9 8.9 - 10.3 mg/dL    GFR, Estimated 9 (L) >60 mL/min      Comment: (NOTE) Calculated using the CKD-EPI Creatinine Equation (2021)      Anion gap 13 5 - 15      Comment: Performed at [facility], [address] [place], [place] [address]  CBC with Differential/Platelet     Status: Abnormal    Collection Time: 07/17/23  4:32 AM  Result Value Ref Range    WBC 9.3 4.0 - 10.5 K/uL    RBC 3.45 (L) 4.22 - 5.81 MIL/uL    Hemoglobin 9.2 (L) 13.0 - 17.0 g/dL    HCT 71.0 (L) 60.9 - 52.0 %    MCV 83.8 80.0 - 100.0 fL    MCH 26.7 26.0 - 34.0 pg    MCHC 31.8 30.0 - 36.0 g/dL    RDW 83.7 (H) 88.4 - 15.5 %    Platelets 341 150 - 400 K/uL    nRBC 0.0 0.0 - 0.2 %    Neutrophils Relative % 72 %    Neutro Abs 6.8 1.7 - 7.7 K/uL    Lymphocytes Relative 10 %    Lymphs Abs 0.9 0.7 - 4.0 K/uL    Monocytes Relative 14 %    Monocytes Absolute 1.3 (H)  0.1 - 1.0  K/uL    Eosinophils Relative 3 %    Eosinophils Absolute 0.2 0.0 - 0.5 K/uL    Basophils Relative 0 %    Basophils Absolute 0.0 0.0 - 0.1 K/uL    Immature Granulocytes 1 %    Abs Immature Granulocytes 0.05 0.00 - 0.07 K/uL      Comment: Performed at [facility], [address], [place], [place] [address]  Vancomycin , random     Status: None    Collection Time: 07/17/23  4:32 AM  Result Value Ref Range    Vancomycin  Rm 25 ug/mL      Comment:        Random Vancomycin  therapeutic range is dependent on dosage and time of specimen collection. A peak range is 20-40 ug/mL A trough range is 5-15 ug/mL        Performed at [facility], [address] [place], [place] [address]    HCV Ab w Reflex to Quant PCR     Status: None    Collection Time: 07/17/23  4:32 AM  Result Value Ref Range    HCV Ab Non Reactive Non Reactive      Comment: (NOTE) Performed At: [facility] [address] [place], [place] [id number] Jennette Shorter MD Ey:[id number]    Hepatitis A antibody, total     Status: None    Collection Time: 07/17/23  4:32 AM  Result Value Ref Range    hep A Total Ab NON REACTIVE NON REACTIVE      Comment: Performed at [facility], [address], [place], [place] [address]  Interpretation:     Status: None    Collection Time: 07/17/23  4:32 AM  Result Value Ref Range    HCV Interp 1: Comment        Comment: (NOTE) Not infected with HCV unless early or acute infection is suspected (which may be delayed in an immunocompromised individual), or other evidence exists to indicate HCV infection. Performed At: [facility] [address] [place], [place] [id number] Jennette Shorter MD Ey:[id number]    Glucose, capillary     Status: Abnormal    Collection Time: 07/17/23  7:18 AM  Result Value Ref Range    Glucose-Capillary 244 (H) 70 - 99 mg/dL      Comment: Glucose reference range applies only to samples taken after fasting for at least 8 hours.   Glucose, capillary     Status: Abnormal    Collection Time: 07/17/23  1:21 PM  Result Value Ref Range    Glucose-Capillary 130 (H) 70 - 99 mg/dL      Comment: Glucose reference range applies only to samples taken after fasting for at least 8 hours.  Glucose, capillary     Status: Abnormal    Collection Time: 07/17/23  4:30 PM  Result Value Ref Range    Glucose-Capillary 118 (H) 70 - 99 mg/dL      Comment: Glucose reference range applies only to samples taken after fasting for at least 8 hours.  Glucose, capillary     Status: Abnormal    Collection Time: 07/17/23  9:39 PM  Result Value Ref Range    Glucose-Capillary 125 (H) 70 - 99 mg/dL      Comment: Glucose reference range applies only to samples taken after fasting for at least 8 hours.  CBC with Differential/Platelet     Status: Abnormal    Collection Time: 07/18/23  5:48 AM  Result Value Ref Range    WBC 7.6 4.0 - 10.5 K/uL    RBC 3.31 (L) 4.22 - 5.81 MIL/uL    Hemoglobin 9.0 (L) 13.0 - 17.0 g/dL    HCT 72.2 (L) 60.9 - 52.0 %    MCV 83.7 80.0 - 100.0 fL    MCH 27.2 26.0 - 34.0 pg    MCHC 32.5 30.0 - 36.0 g/dL    RDW 83.8 (H) 88.4 - 15.5 %    Platelets 250 150 - 400 K/uL    nRBC 0.0 0.0 - 0.2 %    Neutrophils Relative % 68 %    Neutro Abs 5.2 1.7 - 7.7 K/uL    Lymphocytes Relative 12 %    Lymphs Abs 0.9 0.7 - 4.0 K/uL    Monocytes Relative 10 %    Monocytes Absolute 0.8 0.1 - 1.0 K/uL    Eosinophils Relative 9 %    Eosinophils Absolute 0.6 (H) 0.0 - 0.5 K/uL    Basophils Relative 1 %    Basophils Absolute 0.1 0.0 - 0.1 K/uL    Immature Granulocytes 0 %    Abs Immature Granulocytes 0.02 0.00 - 0.07 K/uL      Comment: Performed at [facility], [address] [place], [place] [address]  Basic metabolic panel     Status: Abnormal    Collection Time: 07/18/23  5:49 AM  Result Value Ref Range    Sodium 131 (L) 135 - 145 mmol/L    Potassium 4.0 3.5 - 5.1 mmol/L    Chloride 96 (L) 98 - 111 mmol/L    CO2 26 22 - 32  mmol/L    Glucose, Bld 145 (H) 70 - 99 mg/dL      Comment: Glucose reference range applies only to samples taken after fasting for at least 8 hours.    BUN 15 8 - 23 mg/dL    Creatinine, Ser 5.70 (H) 0.61 - 1.24 mg/dL    Calcium  8.6 (L) 8.9 - 10.3 mg/dL    GFR, Estimated 15 (L) >60 mL/min      Comment: (NOTE) Calculated using the CKD-EPI Creatinine Equation (2021)      Anion gap 9 5 - 15      Comment: Performed at [facility], [address] [place], [place] [address]  Glucose, capillary     Status: Abnormal    Collection Time: 07/18/23  7:27 AM  Result Value Ref Range    Glucose-Capillary 129 (H) 70 - 99 mg/dL      Comment: Glucose reference range applies only to samples taken after fasting for at least 8 hours.      Imaging Results (Last 48 hours)  No results found.          Blood pressure (!) 145/63, pulse 80, temperature 97.6 F (36.4 C), temperature source Oral, resp. rate 17, height 5' 11 (1.803 m), weight 65.9 kg, SpO2 100%.   Medical Problem List and Plan: 1. 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  Antithrombotics: -DVT/anticoagulation:  Pharmaceutical: Heparin              -antiplatelet therapy:  continue aspirin  325 mg daily   3. Pain Management:  -Tylenol  as needed -Norco as needed 5-10 mg q4 hours- might need Oxy if not enough- was taking IV Dilaudid  solely on Acute.              -Flexeril  10 mg TID as needed   4. Mood/Behavior/Sleep: LCSW to evaluate and provide emotional support             -continue melatonin 5 mg q HS             -antipsychotic agents: n/a   5. Neuropsych/cognition: This patient is capable of making decisions on his own behalf.   6. Skin/Wound Care: Routine skin care checks              -monitor surgical incisions   7. Fluids/Electrolytes/Nutrition: Strict Is and Os and follow-up chemistries per nephrology   8: Hypertension: monitor TID and prn             -continue amlodipine  5 mg daily             -continue lisinopril  20 mg BID   9: Hyperlipidemia: continue statin   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 , Cosopt , Xalantan   11: ESRD: HD>>M,W,F via right internal jugular TDC             -continue calcitrol 0.5 mcg M,W,F             -continue Aranesp  60 mcg every Wednesday             -continue Fosrenol  1,000 mg TID with meals   12: DM-2: A1c = 5.8%; at home on Tresiba  Flextouch 12u at bedtime             -continue SSI             -continue Semglee  2 units q HS   13: Constipation: continue MiraLax  daily, Senokot-S BID; prns ordered   14: Discitis/osteomyelitis with severe stenosis and myelopathy:  -continue vancomycin  750 mg with HD through 08/26/2023- will likely change to Chronic Doxycycline  after that, but need to reach out to ID when close to d/c to verify             -follow-up with ID   15: C6, 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 Louis   16: History of prior CVA: on aspirin  and statin   17. Neurogenic bowel- LBM 9 days ago- will order Sorbitol to get him to go.  If doesn't work, will need soap suds enema, etc- might need KUB   @Sandra  J Setzer, PA-C 07/18/2023     I have personally performed a face to face diagnostic evaluation of this patient and formulated the key components of the plan.  Additionally, I have personally reviewed laboratory data, imaging studies, as well as relevant notes and concur with the physician assistant's documentation above.   The patient's  status has not changed from the original H&P.  Any changes in documentation from the acute care chart have been noted above.          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 He stated pain radiated down both arms. 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 CT C-spine showed severe erosion at the C6 and C7 with severe spinal canal stenosis at the C6-7 levels concerning for discitis/osteomyelitis. MRI C-spine and T-spine shows vertebral discitis/osteomyelitis at the C6-7 level, severe spinal canal stenosis at this level. Neurosurgery consulted>>extensive destructive process ongoing at C6 and C7 with vertebral collapse of C6 and C7 vertebral bodies. There is a central protrusion at the level of the C6-7 disc space causing moderately severe stenosis with some cord compression but no high signal abnormality. There was prevertebral phlegmon possibly consistent with osteomyelitis discitis. There was no evidence of significant epidural abscess. Started on empiric antibiotics and blood cultures positive for Staphylococcus epidermidis. ID consulted. Had attempt at catheter exchange in IR on 1/03 -unable to remove catheter d/t occluded SVC, no further access if removed. Taken to OR on 1/06 and underwent C5-T1 anterior cervical fusion utilizing interbody cage. Remains on IV vancomycin  on HD days though 08/26/2023. Aspen collar in place. Aspirin  325 mg daily. The patient requires inpatient medicine and rehabilitation evaluations and services for ongoing dysfunction secondary to due  to cervical myelopathy and osteomyelitis.   Pt reports LBM was 1/1- last Tuesday-  Feels very constipated- has received miralax , and suppository, but no sorbitol.    Pt also reports pain in neck not bad- but using IV Dilaudid , not PO- since works so well- explained we don't do IV pain meds in rehab- will need to take PO meds.    Denies muscle spasms.  Does feel stronger since had surgery on neck- initially said RUE didn't work well at all- much better.      Review of Systems  Constitutional: Negative.   HENT: Negative.    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   Cardiovascular:  Negative for chest pain and leg swelling.  Gastrointestinal:  Positive for constipation. Negative for abdominal pain and nausea.       Endorses constipation and dysphagia with solid food  Genitourinary:        States does not make urine- anuric  Musculoskeletal:  Positive for myalgias and neck pain.  Skin: Negative.   Neurological:  Positive for focal weakness and weakness. Negative for headaches.  Endo/Heme/Allergies: Negative.   Psychiatric/Behavioral:  The patient has insomnia.        Chronic poor sleep  All other systems reviewed and are negative.       Past Medical History:  Diagnosis Date   Anemia     Arthritis      patient denies   Blind right eye     Complication of anesthesia     Diabetes mellitus      type II   ESRD on hemodialysis [facility])      Dialysis since 2011 MWF   HCAP (healthcare-associated pneumonia) 12/19/2022   Hypertension     Legally blind     No pertinent past medical history  PONV (postoperative nausea and vomiting)      N/V- became dehydrated had to have IV fluids- 01/2015   Shortness of breath dyspnea      when has too fluid   Stroke (HCC) 07/09/2010    date per patient   Syncope     Unspecified cerebral artery occlusion with cerebral infarction 04/02/2013             Past Surgical  History:  Procedure Laterality Date   AV FISTULA PLACEMENT Right 05/12/2013    Procedure: INSERTION OF ARTERIOVENOUS (AV) GORE-TEX GRAFT ARM; ULTRASOUND GUIDED;  Surgeon: Redell LITTIE Door, MD;  Location: [facility] OR;  Service: Vascular;  Laterality: Right;   AV FISTULA PLACEMENT Left 01/18/2015    Procedure: INSERTION OF ARTERIOVENOUS GORE-TEX GRAFT LEFT UPPER ARM;  Surgeon: Redell LITTIE Door, MD;  Location: [facility] OR;  Service: Vascular;  Laterality: Left;   COLONOSCOPY   06/2012   EYE SURGERY Bilateral      retina surgery both eyes, cataract surgery both eyes   HERNIA REPAIR        right inguinal   INSERTION OF DIALYSIS CATHETER Right     IR FLUORO GUIDE CV LINE RIGHT   07/12/2023   IR PTA VENOUS EXCEPT DIALYSIS CIRCUIT   07/12/2023   left arm graft   10/2010   LIGATION ARTERIOVENOUS GORTEX GRAFT Left 05/03/2015    Procedure: LIGATION ARTERIOVENOUS GORTEX GRAFT-LEFT UPPER ARM;  Surgeon: Redell LITTIE Door, MD;  Location: [facility] OR;  Service: Vascular;  Laterality: Left;             Family History  Problem Relation Age of Onset   Diabetes Mother     Alzheimer's disease Mother     Cancer Father     Heart disease Father     Cancer Sister     Stroke Sister          Social History:  reports that he has never smoked. He has never used smokeless tobacco. He reports that he does not drink alcohol  and does not use drugs. Allergies:  Allergies  No Known Allergies         Medications Prior to Admission  Medication Sig Dispense Refill   acetaminophen  (TYLENOL ) 500 MG tablet Take 1,000 mg by mouth once as needed.       amLODipine  (NORVASC ) 5 MG tablet Take 5 mg by mouth daily.       aspirin  EC 325 MG tablet Take 325 mg by mouth at bedtime.       b complex vitamins tablet Take 1 tablet by mouth at bedtime.       brimonidine  (ALPHAGAN ) 0.2 % ophthalmic solution Place 1 drop into both eyes 2 (two) times daily.       calcitRIOL  (ROCALTROL ) 0.5 MCG capsule Take 1 capsule (0.5 mcg total) by mouth 3 (three) times a week.  (Patient taking differently: Take 0.5 mcg by mouth every Monday, Wednesday, and Friday.) 30 capsule 0   Darbepoetin Alfa  (ARANESP ) 100 MCG/0.5ML SOSY injection Inject 0.5 mLs (100 mcg total) into the skin every Friday at 6 PM. 4.2 mL 0   dorzolamide -timolol  (COSOPT ) 22.3-6.8 MG/ML ophthalmic solution Place 1 drop into both eyes 2 (two) times daily.       lanthanum  (FOSRENOL ) 1000 MG chewable tablet Chew 1,000 mg by mouth 3 (three) times daily with meals.       latanoprost  (XALATAN ) 0.005 % ophthalmic solution Place 1 drop into both eyes at bedtime.  lisinopril  (ZESTRIL ) 20 MG tablet Take 20 mg by mouth 2 (two) times daily.       Methoxy PEG-Epoetin  Beta (MIRCERA IJ) Inject 1 Dose into the skin every 14 (fourteen) days.       rosuvastatin  (CRESTOR ) 40 MG tablet Take 40 mg by mouth daily.       TRESIBA  FLEXTOUCH 100 UNIT/ML FlexTouch Pen Inject 12 Units into the skin at bedtime.                  Home: Home Living Family/patient expects to be discharged to:: Private residence (Simultaneous filing. User may not have seen previous data.) Living Arrangements: Spouse/significant other Available Help at Discharge: Family, Available 24 hours/day Type of Home: Apartment Home Access: Level entry Home Layout: One level Bathroom Shower/Tub: Walk-in shower, Other (comment), Sponge bathes at baseline Bathroom Toilet: Standard Bathroom Accessibility: Yes Home Equipment: BSC/3in1, Cane - single point, Rollator (4 wheels), Shower seat - built in (Designer, industrial/product. User may not have seen previous data.)  Lives With: Spouse   Functional History: Prior Function Prior Level of Function : Needs assist (Simultaneous filing. User may not have seen previous data.) Physical Assist : Mobility (physical), ADLs (physical) Mobility (physical): Stairs (uses cane inside and rolling walker outside) ADLs (physical): IADLs Mobility Comments: SPC in the house, RW in the community and for dialysis  (Simultaneous filing. User may not have seen previous data.) ADLs Comments: Reports Indep, Wife does all IADLs for pt. (Simultaneous filing. User may not have seen previous data.)   Functional Status:  Mobility: Bed Mobility Overal bed mobility: Needs Assistance (Simultaneous filing. User may not have seen previous data.) Bed Mobility: Rolling, Supine to Sit, Sit to Supine Rolling: Mod assist Supine to sit: Mod assist, Used rails Sit to supine: Mod assist, +2 for safety/equipment General bed mobility comments: Tactile cueing and hand over hand assist to find rail for roll R; heavy mod assist to come to sit; Heavy mod assist to help LEs back into bed and reposition back in supine Transfers Overall transfer level: Needs assistance Equipment used: Rolling walker (2 wheels) Transfers: Sit to/from Stand, Bed to chair/wheelchair/BSC Sit to Stand: Mod assist, From elevated surface Bed to/from chair/wheelchair/BSC transfer type:: Step pivot Step pivot transfers: Mod assist, From elevated surface General transfer comment: Pt required cues for hand placement and cues to bring calves off back of bed to pivot to chair.  Pt reports feeling unsteady on his feet but states this was his first time up to chair since surgery (although chair was prepared when therapist walked in) (Simultaneous filing. User may not have seen previous data.)   ADL: ADL Overall ADL's : Needs assistance/impaired Eating/Feeding: Set up, Supervision/ safety, Bed level Eating/Feeding Details (indicate cue type and reason): Once pt got positioned correctly in bed and tray set up for him with orientaion explained he can feed himself. Does do better with finger foods and cups with straws. Grooming: Wash/dry hands, Wash/dry face, Oral care, Minimal assistance, Sitting Grooming Details (indicate cue type and reason): assist more for vision and less for fine motor assist. Upper Body Bathing: Set up, Sitting Lower Body Bathing:  Moderate assistance, Sit to/from stand, Cueing for compensatory techniques Lower Body Bathing Details (indicate cue type and reason): pt limited by pain and inability to access B LEs at this time. Upper Body Dressing : Moderate assistance, Sitting Upper Body Dressing Details (indicate cue type and reason): Pt with pain and unable to get shirt over head or around back to  dress UE without assist. Lower Body Dressing: Maximal assistance, Sit to/from stand, Cueing for compensatory techniques Lower Body Dressing Details (indicate cue type and reason): Pain limiting pt from accessing LEs easily at this time. Pt normally crosses legs over but this is painful due to pulling legs up with BUEs. Toilet Transfer: Moderate assistance, Stand-pivot, BSC/3in1, Rolling walker (2 wheels) Toileting- Clothing Manipulation and Hygiene: Moderate assistance, Sit to/from stand, Cueing for compensatory techniques Functional mobility during ADLs: Moderate assistance, Rolling walker (2 wheels) General ADL Comments: Pt signficantly limited by pain and unsteadiness on feet during all mobility.   Cognition: Cognition Overall Cognitive Status: Within Functional Limits for tasks assessed Orientation Level: Oriented X4 Cognition Arousal: Alert Behavior During Therapy: WFL for tasks assessed/performed Overall Cognitive Status: Within Functional Limits for tasks assessed General Comments: Pt manages in apartment well with vision loss. Requires assist outside apartment.   Physical Exam: Blood pressure (!) 145/63, pulse 80, temperature 97.6 F (36.4 C), temperature source Oral, resp. rate 17, height 5' 11 (1.803 m), weight 65.9 kg, SpO2 100%. Physical Exam Vitals and nursing note reviewed.  Constitutional:      General: He is not in acute distress.    Appearance: Normal appearance. He is normal weight.     Comments: Awake, alert, appropriate, on bedpan; eyes closed the whole time, NAD  HENT:     Head: Normocephalic and  atraumatic.     Comments: Aspen collar in place    Right Ear: External ear normal.     Left Ear: External ear normal.     Nose: Nose normal. No congestion.     Mouth/Throat:     Mouth: Mucous membranes are dry.     Pharynx: Oropharynx is clear. No oropharyngeal exudate.  Eyes:     General:        Right eye: No discharge.        Left eye: No discharge.     Comments: Cannot see, so keeps eyes closed  Neck:     Comments: Anterior and posterior incisions- C/D/I incision with Cervical collar in place Cardiovascular:     Rate and Rhythm: Normal rate and regular rhythm.     Heart sounds: Normal heart sounds. No murmur heard.    No gallop.  Pulmonary:     Effort: Pulmonary effort is normal. No respiratory distress.     Breath sounds: Normal breath sounds. No wheezing, rhonchi or rales.  Abdominal:     General: Abdomen is flat. There is no distension.     Tenderness: There is no abdominal tenderness.     Comments: Very hypoactive BS  Musculoskeletal:     Right lower leg: No edema.     Left lower leg: No edema.     Comments: RUE- biceps 4/5; Triceps 4-/5; WE 4+/5; Grip 4/5; and FA 3+/5 LUE- biceps 4+/5; triceps 4+/5; WE 4+/5; 4+ throughout RLE- 4-/5 throughout LLE- 4/5 throughout  Skin:    General: Skin is warm and dry.     Comments: Some ecchymoses on legs that's healing and arms- B/L forearm IVs- look OK   Neurological:     Mental Status: He is alert and oriented to person, place, and time.     Comments: Decreased to light touch in stocking pattern B/L  to knees Otherwise intact to light touch x4 2 beats clonus in B/L LE's- absent DTRs in LE's and 1+ in Ue's No hoffman's B/L   Psychiatric:        Mood and Affect: Mood  normal.        Behavior: Behavior normal.        Lab Results Last 48 Hours        Results for orders placed or performed during the hospital encounter of 07/08/23 (from the past 48 hours)  Glucose, capillary     Status: Abnormal    Collection Time: 07/16/23  11:38 AM  Result Value Ref Range    Glucose-Capillary 175 (H) 70 - 99 mg/dL      Comment: Glucose reference range applies only to samples taken after fasting for at least 8 hours.  Glucose, capillary     Status: Abnormal    Collection Time: 07/16/23  4:06 PM  Result Value Ref Range    Glucose-Capillary 241 (H) 70 - 99 mg/dL      Comment: Glucose reference range applies only to samples taken after fasting for at least 8 hours.  Glucose, capillary     Status: Abnormal    Collection Time: 07/16/23  8:14 PM  Result Value Ref Range    Glucose-Capillary 245 (H) 70 - 99 mg/dL      Comment: Glucose reference range applies only to samples taken after fasting for at least 8 hours.  Basic metabolic panel     Status: Abnormal    Collection Time: 07/17/23  4:32 AM  Result Value Ref Range    Sodium 130 (L) 135 - 145 mmol/L    Potassium 4.4 3.5 - 5.1 mmol/L    Chloride 93 (L) 98 - 111 mmol/L    CO2 24 22 - 32 mmol/L    Glucose, Bld 258 (H) 70 - 99 mg/dL      Comment: Glucose reference range applies only to samples taken after fasting for at least 8 hours.    BUN 30 (H) 8 - 23 mg/dL    Creatinine, Ser 3.63 (H) 0.61 - 1.24 mg/dL    Calcium  8.9 8.9 - 10.3 mg/dL    GFR, Estimated 9 (L) >60 mL/min      Comment: (NOTE) Calculated using the CKD-EPI Creatinine Equation (2021)      Anion gap 13 5 - 15      Comment: Performed at [facility], [address], [place], [place] [address]  CBC with Differential/Platelet     Status: Abnormal    Collection Time: 07/17/23  4:32 AM  Result Value Ref Range    WBC 9.3 4.0 - 10.5 K/uL    RBC 3.45 (L) 4.22 - 5.81 MIL/uL    Hemoglobin 9.2 (L) 13.0 - 17.0 g/dL    HCT 71.0 (L) 60.9 - 52.0 %    MCV 83.8 80.0 - 100.0 fL    MCH 26.7 26.0 - 34.0 pg    MCHC 31.8 30.0 - 36.0 g/dL    RDW 83.7 (H) 88.4 - 15.5 %    Platelets 341 150 - 400 K/uL    nRBC 0.0 0.0 - 0.2 %    Neutrophils Relative % 72 %    Neutro Abs 6.8 1.7 - 7.7 K/uL    Lymphocytes Relative 10 %     Lymphs Abs 0.9 0.7 - 4.0 K/uL    Monocytes Relative 14 %    Monocytes Absolute 1.3 (H) 0.1 - 1.0 K/uL    Eosinophils Relative 3 %    Eosinophils Absolute 0.2 0.0 - 0.5 K/uL    Basophils Relative 0 %    Basophils Absolute 0.0 0.0 - 0.1 K/uL    Immature Granulocytes 1 %  Abs Immature Granulocytes 0.05 0.00 - 0.07 K/uL      Comment: Performed at [facility], [address], [place], [place] [address]  Vancomycin , random     Status: None    Collection Time: 07/17/23  4:32 AM  Result Value Ref Range    Vancomycin  Rm 25 ug/mL      Comment:        Random Vancomycin  therapeutic range is dependent on dosage and time of specimen collection. A peak range is 20-40 ug/mL A trough range is 5-15 ug/mL        Performed at [facility], [address], [place], [place] [address]    HCV Ab w Reflex to Quant PCR     Status: None    Collection Time: 07/17/23  4:32 AM  Result Value Ref Range    HCV Ab Non Reactive Non Reactive      Comment: (NOTE) Performed At: [facility] [address] [place], [place] [id number] Jennette Shorter MD Ey:[id number]    Hepatitis A antibody, total     Status: None    Collection Time: 07/17/23  4:32 AM  Result Value Ref Range    hep A Total Ab NON REACTIVE NON REACTIVE      Comment: Performed at [facility], [address] [place], [place] [address]  Interpretation:     Status: None    Collection Time: 07/17/23  4:32 AM  Result Value Ref Range    HCV Interp 1: Comment        Comment: (NOTE) Not infected with HCV unless early or acute infection is suspected (which may be delayed in an immunocompromised individual), or other evidence exists to indicate HCV infection. Performed At: [facility] [address] [place], [place] [id number] Jennette Shorter MD Ey:[id number]    Glucose, capillary     Status: Abnormal    Collection Time: 07/17/23  7:18 AM  Result Value Ref Range    Glucose-Capillary 244 (H) 70 - 99 mg/dL       Comment: Glucose reference range applies only to samples taken after fasting for at least 8 hours.  Glucose, capillary     Status: Abnormal    Collection Time: 07/17/23  1:21 PM  Result Value Ref Range    Glucose-Capillary 130 (H) 70 - 99 mg/dL      Comment: Glucose reference range applies only to samples taken after fasting for at least 8 hours.  Glucose, capillary     Status: Abnormal    Collection Time: 07/17/23  4:30 PM  Result Value Ref Range    Glucose-Capillary 118 (H) 70 - 99 mg/dL      Comment: Glucose reference range applies only to samples taken after fasting for at least 8 hours.  Glucose, capillary     Status: Abnormal    Collection Time: 07/17/23  9:39 PM  Result Value Ref Range    Glucose-Capillary 125 (H) 70 - 99 mg/dL      Comment: Glucose reference range applies only to samples taken after fasting for at least 8 hours.  CBC with Differential/Platelet     Status: Abnormal    Collection Time: 07/18/23  5:48 AM  Result Value Ref Range    WBC 7.6 4.0 - 10.5 K/uL    RBC 3.31 (L) 4.22 - 5.81 MIL/uL    Hemoglobin 9.0 (L) 13.0 - 17.0 g/dL    HCT 72.2 (L) 60.9 - 52.0 %    MCV 83.7  80.0 - 100.0 fL    MCH 27.2 26.0 - 34.0 pg    MCHC 32.5 30.0 - 36.0 g/dL    RDW 83.8 (H) 88.4 - 15.5 %    Platelets 250 150 - 400 K/uL    nRBC 0.0 0.0 - 0.2 %    Neutrophils Relative % 68 %    Neutro Abs 5.2 1.7 - 7.7 K/uL    Lymphocytes Relative 12 %    Lymphs Abs 0.9 0.7 - 4.0 K/uL    Monocytes Relative 10 %    Monocytes Absolute 0.8 0.1 - 1.0 K/uL    Eosinophils Relative 9 %    Eosinophils Absolute 0.6 (H) 0.0 - 0.5 K/uL    Basophils Relative 1 %    Basophils Absolute 0.1 0.0 - 0.1 K/uL    Immature Granulocytes 0 %    Abs Immature Granulocytes 0.02 0.00 - 0.07 K/uL      Comment: Performed at [facility], [address] [place], [place] [address]  Basic metabolic panel     Status: Abnormal    Collection Time: 07/18/23  5:49 AM  Result Value Ref Range    Sodium 131 (L) 135 -  145 mmol/L    Potassium 4.0 3.5 - 5.1 mmol/L    Chloride 96 (L) 98 - 111 mmol/L    CO2 26 22 - 32 mmol/L    Glucose, Bld 145 (H) 70 - 99 mg/dL      Comment: Glucose reference range applies only to samples taken after fasting for at least 8 hours.    BUN 15 8 - 23 mg/dL    Creatinine, Ser 5.70 (H) 0.61 - 1.24 mg/dL    Calcium  8.6 (L) 8.9 - 10.3 mg/dL    GFR, Estimated 15 (L) >60 mL/min      Comment: (NOTE) Calculated using the CKD-EPI Creatinine Equation (2021)      Anion gap 9 5 - 15      Comment: Performed at [facility], [address], [place], [place] [address]  Glucose, capillary     Status: Abnormal    Collection Time: 07/18/23  7:27 AM  Result Value Ref Range    Glucose-Capillary 129 (H) 70 - 99 mg/dL      Comment: Glucose reference range applies only to samples taken after fasting for at least 8 hours.      Imaging Results (Last 48 hours)  No results found.         Blood pressure (!) 145/63, pulse 80, temperature 97.6 F (36.4 C), temperature source Oral, resp. rate 17, height 5' 11 (1.803 m), weight 65.9 kg, SpO2 100%.   Medical Problem List and Plan: 1. 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  Antithrombotics: -DVT/anticoagulation:  Pharmaceutical: Heparin              -antiplatelet therapy: continue aspirin  325 mg daily   3. Pain Management:  -Tylenol  as needed -Norco as needed 5-10 mg q4 hours- might need Oxy if not enough- was taking IV Dilaudid  solely on Acute.              -Flexeril  10  mg TID as needed   4. Mood/Behavior/Sleep: LCSW to evaluate and provide emotional support             -continue melatonin 5 mg q HS             -antipsychotic agents: n/a   5. Neuropsych/cognition: This patient is capable of  making decisions on his own behalf.   6. Skin/Wound Care: Routine skin care checks             -monitor surgical incisions   7. Fluids/Electrolytes/Nutrition: Strict Is and Os and follow-up chemistries per nephrology   8: Hypertension: monitor TID and prn             -continue amlodipine  5 mg daily             -continue lisinopril  20 mg BID   9: Hyperlipidemia: continue statin   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 , Cosopt , Xalantan   11: ESRD: HD>>M,W,F via right internal jugular TDC             -continue calcitrol 0.5 mcg M,W,F             -continue Aranesp  60 mcg every Wednesday             -continue Fosrenol  1,000 mg TID with meals   12: DM-2: A1c = 5.8%; at home on Tresiba  Flextouch 12u at bedtime             -continue SSI             -continue Semglee  2 units q HS   13: Constipation: continue MiraLax  daily, Senokot-S BID; prns ordered   14: Discitis/osteomyelitis with severe stenosis and myelopathy:  -continue vancomycin  750 mg with HD through 08/26/2023- will likely change to Chronic Doxycycline  after that, but need to reach out to ID when close to d/c to verify             -follow-up with ID   15: C6, 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 Louis   16: History of prior CVA: on aspirin  and statin   17. Neurogenic bowel- LBM 9 days ago- will order Sorbitol to get him to go.  If doesn't work, will need soap suds enema, etc- might need KUB   @Sandra  J Setzer, PA-C 07/18/2023     I have personally performed a face to face diagnostic evaluation of this patient and formulated the key components of the plan.  Additionally, I have personally reviewed laboratory data, imaging studies, as well as  relevant notes and concur with the physician assistant's documentation above.   The patient's status has not changed from the original H&P.  Any changes in documentation from the acute care chart have been noted above.

## 2023-07-18 NOTE — Progress Notes (Signed)
 [facility] Progress Note   Subjective:   Patient seen in room - laying comfortably in bed. S/p HD yesterday. Denies CP/dyspnea. Admits to some nausea with no vomiting. Denies significant pain, states he is doing as well as can be expected.   Objective Vitals:   07/17/23 1529 07/17/23 2137 07/18/23 0545 07/18/23 0726  BP: (!) 140/64 (!) 153/71 (!) 151/72 (!) 145/63  Pulse: 85 85 82 80  Resp: 16 18 18 17   Temp: (!) 97.5 F (36.4 C) 98.5 F (36.9 C) 98.2 F (36.8 C) 97.6 F (36.4 C)  TempSrc: Oral   Oral  SpO2: 100% 99% 100% 100%  Weight:      Height:       Physical Exam General: Blind, NAD. C-collar in place.  Heart: RRR, 3/6 murmur, no R/G Lungs: clear anteriorly  Abdomen: soft, non-tender, non-distended Extremities: no LE edema Dialysis Access: R [facility]  Additional Objective Labs: Basic Metabolic Panel: Recent Labs  Lab 07/15/23 0056 07/15/23 1838 07/16/23 0412 07/17/23 0432 07/18/23 0549  NA 127*   < > 133* 130* 131*  K 3.8   < > 4.7 4.4 4.0  CL 90*   < > 97* 93* 96*  CO2 23  --  21* 24 26  GLUCOSE 121*   < > 171* 258* 145*  BUN 31*   < > 14 30* 15  CREATININE 7.23*   < > 4.75* 6.36* 4.29*  CALCIUM  8.9  --  9.1 8.9 8.6*  PHOS 3.2  --   --   --   --    < > = values in this interval not displayed.   Liver Function Tests: Recent Labs  Lab 07/15/23 0056  ALBUMIN  2.9*   CBC: Recent Labs  Lab 07/14/23 1224 07/15/23 0056 07/15/23 1838 07/16/23 0412 07/17/23 0432 07/18/23 0548  WBC 6.0 7.0  --  17.0* 9.3 7.6  NEUTROABS 3.8 4.2  --  16.1* 6.8 5.2  HGB 11.0* 11.6*   < > 11.1* 9.2* 9.0*  HCT 34.9* 37.0*   < > 35.1* 28.9* 27.7*  MCV 82.5 83.3  --  84.0 83.8 83.7  PLT 313 142*  --  316 341 250   < > = values in this interval not displayed.   Blood Culture    Component Value Date/Time   SDES BLOOD RIGHT ARM 07/11/2023 1549   SPECREQUEST  07/11/2023 1549    BOTTLES DRAWN AEROBIC ONLY Blood Culture results may not be optimal due to an  inadequate volume of blood received in culture bottles   CULT  07/11/2023 1549    NO GROWTH 5 DAYS Performed at [facility], [address], [place], [place] [address]    REPTSTATUS 07/16/2023 FINAL 07/11/2023 1549    CBG: Recent Labs  Lab 07/17/23 0718 07/17/23 1321 07/17/23 1630 07/17/23 2139 07/18/23 0727  GLUCAP 244* 130* 118* 125* 129*    Medications:  sodium chloride      sodium chloride      heparin  sodium (porcine)     vancomycin  (VANCOCIN ) 750 mg in sodium chloride  0.9 % 250 mL IVPB Stopped (07/17/23 1220)    (feeding supplement) PROSource Plus  30 mL Oral BID BM   aspirin  EC  325 mg Oral QHS   atorvastatin   80 mg Oral Daily   bisacodyl   10 mg Rectal Once   brimonidine   1 drop Both Eyes BID   calcitRIOL   0.5 mcg Oral Q M,W,F   carvedilol   3.125 mg Oral BID WC  Chlorhexidine  Gluconate Cloth  6 each Topical Q0600   darbepoetin (ARANESP ) injection - DIALYSIS  60 mcg Subcutaneous Q Wed-1800   dorzolamide -timolol   1 drop Both Eyes BID   heparin   5,000 Units Subcutaneous Q8H   insulin  aspart  0-9 Units Subcutaneous TID WC   insulin  glargine-yfgn  2 Units Subcutaneous QHS   lanthanum   1,000 mg Oral TID WC   latanoprost   1 drop Both Eyes QHS   lisinopril   20 mg Oral BID   melatonin  5 mg Oral QHS   polyethylene glycol  17 g Oral Daily   senna-docusate  1 tablet Oral BID   sodium chloride  flush  3 mL Intravenous Q12H    Dialysis Orders MWF - [facility] 3:30hr, 400/A1.5, EDW 69.9kg, 2K/2.5Ca, TDC, heparin  6500 unit bolus + 2900 unit mid-run bolus - Mircera 120mcg IV q 2 weeks - last 12/22 - Calcitriol  0.88mcg PO q HD   Assessment/Plan: Cervical discitis/osteomyelitis: Blood Cx Staph Epi - on Vanc 750 mcg IV q HD until 08/26/23 per ID. Cervical spine stenosis/compression: Neurosurgery following, s/p fusion 1/6.  ESRD: Usual MWF schedule - HD tomorrow. HTN/volume: BP slightly elevated, UF as tolerated tomorrow. He is below prior EDW (will lower on d/c). Anemia of  ESRD: Hgb 9.0 - cont ESA 60mcg Q Wednesday while admitted.  Secondary HPTH: Ca/phos fine - continue VDRA - no binder for now. Nutrition: Alb low, continue supplements. T2DM (with retinopathy/blindness)  Signe Sick, PA-S Izetta Boehringer, PA-C 07/18/2023, 11:32 AM  [facility]

## 2023-07-18 NOTE — Progress Notes (Signed)
 Occupational Therapy Treatment Patient Details Name: Shawn [name] MRN: [id number] DOB: 08-22-1960 Today's Date: 07/18/2023   History of present illness Pt is 63 year old presented to [facility] on  07/08/24 for neck pain and tingling in rt hand. 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 Pt with RUE weakness. Pt placed in cervical collar and scheduled for cervical fusion on 1/6; s/p cervical fusion and corpectomy 1/6, brace when OOB; PMH - CVA, DM, HTN, ESRD on HD, blind.   OT comments  This 63 yo male seen today with addressing self feeding which he did well with once he was properly positioned in bed and oriented to where food and drink were on his tray as well as all drinks put in cups with straws for him. He will continue to benefit from acute OT with follow up from intensive inpatient follow up therapy, >3 hours/day.          Equipment Recommendations  Other (comment) (TBD next venue)    Recommendations for Other Services Rehab consult    Precautions / Restrictions Precautions Precautions: Fall Precaution Comments: pt blind; functional in own enviroment Restrictions Weight Bearing Restrictions Per Provider Order: No              ADL either performed or assessed with clinical judgement   ADL Overall ADL's : Needs assistance/impaired Eating/Feeding: Set up;Supervision/ safety;Bed level Eating/Feeding Details (indicate cue type and reason): Once pt got positioned correctly in bed and tray set up for him with orientaion explained he can feed himself. Does do better with finger foods and cups with straws.                                        Extremity/Trunk Assessment Upper Extremity Assessment Upper Extremity Assessment: Right hand dominant;RUE deficits/detail;LUE deficits/detail RUE Deficits / Details: AROM WFL. Strength: shoulder 4/5, biceps 5/5, triceps 3+/5, grip 4+/5 LUE Deficits / Details:  AROM WFL for self feeding in bed            Vision Baseline Vision/History: 2 Legally blind Ability to See in Adequate Light: 4 Severely impaired Patient Visual Report: No change from baseline Additional Comments: Pt reports he can see shadows          Cognition Arousal: Alert Behavior During Therapy: WFL for tasks assessed/performed Overall Cognitive Status: Within Functional Limits for tasks assessed                                 General Comments: Pt manages in apartment well with vision loss. Requires assist outside apartment.                   Pertinent Vitals/ Pain       Pain Assessment Pain Assessment: Faces Faces Pain Scale: Hurts little more Pain Location: Neck Pain Descriptors / Indicators: Sore, Discomfort Pain Intervention(s): Limited activity within patient's tolerance, Monitored during session, Repositioned         Frequency  Min 1X/week        Progress Toward Goals  OT Goals(current goals can now be found in the care plan section)  Progress towards OT goals: Progressing toward goals  Acute Rehab OT Goals Patient Stated Goal: to continue to feel better OT Goal Formulation: With patient Time For Goal Achievement: 07/30/23 Potential to  Achieve Goals: Good ADL Goals Pt Will Perform Eating: (P) with modified independence;sitting (post proper set up and positioning)         AM-PAC OT 6 Clicks Daily Activity     Outcome Measure   Help from another person eating meals?: A Little (setup) Help from another person taking care of personal grooming?: A Little Help from another person toileting, which includes using toliet, bedpan, or urinal?: A Lot Help from another person bathing (including washing, rinsing, drying)?: A Lot Help from another person to put on and taking off regular upper body clothing?: A Lot Help from another person to put on and taking off regular lower body clothing?: A Lot 6 Click Score: 14    End of  Session    OT Visit Diagnosis: Muscle weakness (generalized) (M62.81);Pain Pain - part of body:  (neck)   Activity Tolerance Patient tolerated treatment well   Patient Left in bed;with call bell/phone within reach;with bed alarm set   Nurse Communication  (When pt gets his food he will probably call to get setup properly due to his blindness and c-collar)        Time: 9268-9192 OT Time Calculation (min): 36 min  Charges: OT General Charges $OT Visit: 1 Visit OT Treatments $Self Care/Home Management : 23-37 mins  Donny BECKER OT Acute Rehabilitation Services Office [phone]    [place] Dorothyann Distel 07/18/2023, 9:36 AM

## 2023-07-18 NOTE — H&P (Signed)
 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 He stated pain radiated down both arms. 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 CT C-spine showed severe erosion at the C6 and C7 with severe spinal canal stenosis at the C6-7 levels concerning for discitis/osteomyelitis. MRI C-spine and T-spine shows vertebral discitis/osteomyelitis at the C6-7 level, severe spinal canal stenosis at this level. Neurosurgery consulted>>extensive destructive process ongoing at C6 and C7 with vertebral collapse of C6 and C7 vertebral bodies. There is a central protrusion at the level of the C6-7 disc space causing moderately severe stenosis with some cord compression but no high signal abnormality. There was prevertebral phlegmon possibly consistent with osteomyelitis discitis. There was no evidence of significant epidural abscess. Started on empiric antibiotics and blood cultures positive for Staphylococcus epidermidis. ID consulted. Had attempt at catheter exchange in IR on 1/03 -unable to remove catheter d/t occluded SVC, no further access if removed. Taken to OR on 1/06 and underwent C5-T1 anterior cervical fusion utilizing interbody cage. Remains on IV vancomycin  on HD days though 08/26/2023. Aspen collar in place. Aspirin  325 mg daily. The patient requires inpatient medicine and rehabilitation evaluations and services for ongoing dysfunction secondary to due to cervical myelopathy and osteomyelitis.  Pt reports LBM was 1/1- last Tuesday-  Feels very constipated- has received miralax , and  suppository, but no sorbitol.   Pt also reports pain in neck not bad- but using IV Dilaudid , not PO- since works so well- explained we don't do IV pain meds in rehab- will need to take PO meds.   Denies muscle spasms.  Does feel stronger since had surgery on neck- initially said RUE didn't work well at all- much better.    Review of Systems  Constitutional: Negative.   HENT: Negative.    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   Cardiovascular:  Negative for chest pain and leg swelling.  Gastrointestinal:  Positive for constipation. Negative for abdominal pain and nausea.       Endorses constipation and dysphagia with solid food  Genitourinary:        States does not make urine- anuric  Musculoskeletal:  Positive for myalgias and neck pain.  Skin: Negative.   Neurological:  Positive for focal weakness and weakness. Negative for headaches.  Endo/Heme/Allergies: Negative.   Psychiatric/Behavioral:  The patient has insomnia.        Chronic poor sleep  All other systems reviewed and are negative.  Past Medical History:  Diagnosis Date   Anemia    Arthritis    patient denies   Blind right eye    Complication of anesthesia    Diabetes mellitus    type II   ESRD on hemodialysis [facility])    Dialysis since 2011 MWF   HCAP (healthcare-associated pneumonia) 12/19/2022   Hypertension    Legally blind    No pertinent past medical history    PONV (postoperative nausea and vomiting)    N/V- became dehydrated had to have IV fluids- 01/2015   Shortness of  breath dyspnea    when has too fluid   Stroke (HCC) 07/09/2010   date per patient   Syncope    Unspecified cerebral artery occlusion with cerebral infarction 04/02/2013   Past Surgical History:  Procedure Laterality Date   AV FISTULA PLACEMENT Right 05/12/2013   Procedure: INSERTION OF ARTERIOVENOUS (AV) GORE-TEX GRAFT ARM; ULTRASOUND GUIDED;  Surgeon: Redell LITTIE Door, MD;  Location: [facility] OR;  Service: Vascular;  Laterality: Right;   AV FISTULA PLACEMENT Left 01/18/2015   Procedure: INSERTION OF ARTERIOVENOUS GORE-TEX GRAFT LEFT UPPER ARM;  Surgeon: Redell LITTIE Door, MD;  Location: [facility] OR;  Service: Vascular;  Laterality: Left;   COLONOSCOPY  06/2012   EYE SURGERY Bilateral    retina surgery both eyes, cataract surgery both eyes   HERNIA REPAIR     right inguinal   INSERTION OF DIALYSIS CATHETER Right    IR FLUORO GUIDE CV LINE RIGHT  07/12/2023   IR PTA VENOUS EXCEPT DIALYSIS CIRCUIT  07/12/2023   left arm graft  10/2010   LIGATION ARTERIOVENOUS GORTEX GRAFT Left 05/03/2015   Procedure: LIGATION ARTERIOVENOUS GORTEX GRAFT-LEFT UPPER ARM;  Surgeon: Redell LITTIE Door, MD;  Location: [facility] OR;  Service: Vascular;  Laterality: Left;   Family History  Problem Relation Age of Onset   Diabetes Mother    Alzheimer's disease Mother    Cancer Father    Heart disease Father    Cancer Sister    Stroke Sister    Social History:  reports that he has never smoked. He has never used smokeless tobacco. He reports that he does not drink alcohol  and does not use drugs. Allergies: No Known Allergies Medications Prior to Admission  Medication Sig Dispense Refill   acetaminophen  (TYLENOL ) 500 MG tablet Take 1,000 mg by mouth once as needed.     amLODipine  (NORVASC ) 5 MG tablet Take 5 mg by mouth daily.     aspirin  EC 325 MG tablet Take 325 mg by mouth at bedtime.     b complex vitamins tablet Take 1 tablet by mouth at bedtime.     brimonidine  (ALPHAGAN ) 0.2 % ophthalmic solution Place 1 drop into both eyes 2 (two) times daily.     calcitRIOL  (ROCALTROL ) 0.5 MCG capsule Take 1 capsule (0.5 mcg total) by mouth 3 (three) times a week. (Patient taking differently: Take 0.5 mcg by mouth every Monday, Wednesday, and Friday.) 30 capsule 0   Darbepoetin Alfa  (ARANESP ) 100 MCG/0.5ML SOSY injection Inject 0.5 mLs (100 mcg total) into the skin every Friday at 6 PM. 4.2 mL 0    dorzolamide -timolol  (COSOPT ) 22.3-6.8 MG/ML ophthalmic solution Place 1 drop into both eyes 2 (two) times daily.     lanthanum  (FOSRENOL ) 1000 MG chewable tablet Chew 1,000 mg by mouth 3 (three) times daily with meals.     latanoprost  (XALATAN ) 0.005 % ophthalmic solution Place 1 drop into both eyes at bedtime.      lisinopril  (ZESTRIL ) 20 MG tablet Take 20 mg by mouth 2 (two) times daily.     Methoxy PEG-Epoetin  Beta (MIRCERA IJ) Inject 1 Dose into the skin every 14 (fourteen) days.     rosuvastatin  (CRESTOR ) 40 MG tablet Take 40 mg by mouth daily.     TRESIBA  FLEXTOUCH 100 UNIT/ML FlexTouch Pen Inject 12 Units into the skin at bedtime.        Home: Home Living Family/patient expects to be discharged to:: Private residence (Simultaneous filing. User may not have seen previous data.) Living Arrangements: Spouse/significant  other Available Help at Discharge: Family, Available 24 hours/day Type of Home: Apartment Home Access: Level entry Home Layout: One level Bathroom Shower/Tub: Walk-in shower, Other (comment), Sponge bathes at baseline Bathroom Toilet: Standard Bathroom Accessibility: Yes Home Equipment: BSC/3in1, Cane - single point, Rollator (4 wheels), Shower seat - built in (Designer, industrial/product. User may not have seen previous data.)  Lives With: Spouse   Functional History: Prior Function Prior Level of Function : Needs assist (Simultaneous filing. User may not have seen previous data.) Physical Assist : Mobility (physical), ADLs (physical) Mobility (physical): Stairs (uses cane inside and rolling walker outside) ADLs (physical): IADLs Mobility Comments: SPC in the house, RW in the community and for dialysis (Simultaneous filing. User may not have seen previous data.) ADLs Comments: Reports Indep, Wife does all IADLs for pt. (Simultaneous filing. User may not have seen previous data.)  Functional Status:  Mobility: Bed Mobility Overal bed mobility: Needs Assistance  (Simultaneous filing. User may not have seen previous data.) Bed Mobility: Rolling, Supine to Sit, Sit to Supine Rolling: Mod assist Supine to sit: Mod assist, Used rails Sit to supine: Mod assist, +2 for safety/equipment General bed mobility comments: Tactile cueing and hand over hand assist to find rail for roll R; heavy mod assist to come to sit; Heavy mod assist to help LEs back into bed and reposition back in supine Transfers Overall transfer level: Needs assistance Equipment used: Rolling walker (2 wheels) Transfers: Sit to/from Stand, Bed to chair/wheelchair/BSC Sit to Stand: Mod assist, From elevated surface Bed to/from chair/wheelchair/BSC transfer type:: Step pivot Step pivot transfers: Mod assist, From elevated surface General transfer comment: Pt required cues for hand placement and cues to bring calves off back of bed to pivot to chair.  Pt reports feeling unsteady on his feet but states this was his first time up to chair since surgery (although chair was prepared when therapist walked in) (Simultaneous filing. User may not have seen previous data.)      ADL: ADL Overall ADL's : Needs assistance/impaired Eating/Feeding: Set up, Supervision/ safety, Bed level Eating/Feeding Details (indicate cue type and reason): Once pt got positioned correctly in bed and tray set up for him with orientaion explained he can feed himself. Does do better with finger foods and cups with straws. Grooming: Wash/dry hands, Wash/dry face, Oral care, Minimal assistance, Sitting Grooming Details (indicate cue type and reason): assist more for vision and less for fine motor assist. Upper Body Bathing: Set up, Sitting Lower Body Bathing: Moderate assistance, Sit to/from stand, Cueing for compensatory techniques Lower Body Bathing Details (indicate cue type and reason): pt limited by pain and inability to access B LEs at this time. Upper Body Dressing : Moderate assistance, Sitting Upper Body Dressing  Details (indicate cue type and reason): Pt with pain and unable to get shirt over head or around back to dress UE without assist. Lower Body Dressing: Maximal assistance, Sit to/from stand, Cueing for compensatory techniques Lower Body Dressing Details (indicate cue type and reason): Pain limiting pt from accessing LEs easily at this time. Pt normally crosses legs over but this is painful due to pulling legs up with BUEs. Toilet Transfer: Moderate assistance, Stand-pivot, BSC/3in1, Rolling walker (2 wheels) Toileting- Clothing Manipulation and Hygiene: Moderate assistance, Sit to/from stand, Cueing for compensatory techniques Functional mobility during ADLs: Moderate assistance, Rolling walker (2 wheels) General ADL Comments: Pt signficantly limited by pain and unsteadiness on feet during all mobility.  Cognition: Cognition Overall Cognitive Status: Within Functional Limits for  tasks assessed Orientation Level: Oriented X4 Cognition Arousal: Alert Behavior During Therapy: WFL for tasks assessed/performed Overall Cognitive Status: Within Functional Limits for tasks assessed General Comments: Pt manages in apartment well with vision loss. Requires assist outside apartment.  Physical Exam: Blood pressure (!) 145/63, pulse 80, temperature 97.6 F (36.4 C), temperature source Oral, resp. rate 17, height 5' 11 (1.803 m), weight 65.9 kg, SpO2 100%. Physical Exam Vitals and nursing note reviewed.  Constitutional:      General: He is not in acute distress.    Appearance: Normal appearance. He is normal weight.     Comments: Awake, alert, appropriate, on bedpan; eyes closed the whole time, NAD  HENT:     Head: Normocephalic and atraumatic.     Comments: Aspen collar in place    Right Ear: External ear normal.     Left Ear: External ear normal.     Nose: Nose normal. No congestion.     Mouth/Throat:     Mouth: Mucous membranes are dry.     Pharynx: Oropharynx is clear. No oropharyngeal  exudate.  Eyes:     General:        Right eye: No discharge.        Left eye: No discharge.     Comments: Cannot see, so keeps eyes closed  Neck:     Comments: Anterior and posterior incisions- C/D/I incision with Cervical collar in place Cardiovascular:     Rate and Rhythm: Normal rate and regular rhythm.     Heart sounds: Normal heart sounds. No murmur heard.    No gallop.  Pulmonary:     Effort: Pulmonary effort is normal. No respiratory distress.     Breath sounds: Normal breath sounds. No wheezing, rhonchi or rales.  Abdominal:     General: Abdomen is flat. There is no distension.     Tenderness: There is no abdominal tenderness.     Comments: Very hypoactive BS  Musculoskeletal:     Right lower leg: No edema.     Left lower leg: No edema.     Comments: RUE- biceps 4/5; Triceps 4-/5; WE 4+/5; Grip 4/5; and FA 3+/5 LUE- biceps 4+/5; triceps 4+/5; WE 4+/5; 4+ throughout RLE- 4-/5 throughout LLE- 4/5 throughout  Skin:    General: Skin is warm and dry.     Comments: Some ecchymoses on legs that's healing and arms- B/L forearm IVs- look OK   Neurological:     Mental Status: He is alert and oriented to person, place, and time.     Comments: Decreased to light touch in stocking pattern B/L  to knees Otherwise intact to light touch x4 2 beats clonus in B/L LE's- absent DTRs in LE's and 1+ in Ue's No hoffman's B/L   Psychiatric:        Mood and Affect: Mood normal.        Behavior: Behavior normal.     Results for orders placed or performed during the hospital encounter of 07/08/23 (from the past 48 hours)  Glucose, capillary     Status: Abnormal   Collection Time: 07/16/23 11:38 AM  Result Value Ref Range   Glucose-Capillary 175 (H) 70 - 99 mg/dL    Comment: Glucose reference range applies only to samples taken after fasting for at least 8 hours.  Glucose, capillary     Status: Abnormal   Collection Time: 07/16/23  4:06 PM  Result Value Ref Range   Glucose-Capillary  241 (H) 70 - 99  mg/dL    Comment: Glucose reference range applies only to samples taken after fasting for at least 8 hours.  Glucose, capillary     Status: Abnormal   Collection Time: 07/16/23  8:14 PM  Result Value Ref Range   Glucose-Capillary 245 (H) 70 - 99 mg/dL    Comment: Glucose reference range applies only to samples taken after fasting for at least 8 hours.  Basic metabolic panel     Status: Abnormal   Collection Time: 07/17/23  4:32 AM  Result Value Ref Range   Sodium 130 (L) 135 - 145 mmol/L   Potassium 4.4 3.5 - 5.1 mmol/L   Chloride 93 (L) 98 - 111 mmol/L   CO2 24 22 - 32 mmol/L   Glucose, Bld 258 (H) 70 - 99 mg/dL    Comment: Glucose reference range applies only to samples taken after fasting for at least 8 hours.   BUN 30 (H) 8 - 23 mg/dL   Creatinine, Ser 3.63 (H) 0.61 - 1.24 mg/dL   Calcium  8.9 8.9 - 10.3 mg/dL   GFR, Estimated 9 (L) >60 mL/min    Comment: (NOTE) Calculated using the CKD-EPI Creatinine Equation (2021)    Anion gap 13 5 - 15    Comment: Performed at [facility], [address] [place], [place] [address]  CBC with Differential/Platelet     Status: Abnormal   Collection Time: 07/17/23  4:32 AM  Result Value Ref Range   WBC 9.3 4.0 - 10.5 K/uL   RBC 3.45 (L) 4.22 - 5.81 MIL/uL   Hemoglobin 9.2 (L) 13.0 - 17.0 g/dL   HCT 71.0 (L) 60.9 - 47.9 %   MCV 83.8 80.0 - 100.0 fL   MCH 26.7 26.0 - 34.0 pg   MCHC 31.8 30.0 - 36.0 g/dL   RDW 83.7 (H) 88.4 - 84.4 %   Platelets 341 150 - 400 K/uL   nRBC 0.0 0.0 - 0.2 %   Neutrophils Relative % 72 %   Neutro Abs 6.8 1.7 - 7.7 K/uL   Lymphocytes Relative 10 %   Lymphs Abs 0.9 0.7 - 4.0 K/uL   Monocytes Relative 14 %   Monocytes Absolute 1.3 (H) 0.1 - 1.0 K/uL   Eosinophils Relative 3 %   Eosinophils Absolute 0.2 0.0 - 0.5 K/uL   Basophils Relative 0 %   Basophils Absolute 0.0 0.0 - 0.1 K/uL   Immature Granulocytes 1 %   Abs Immature Granulocytes 0.05 0.00 - 0.07 K/uL    Comment: Performed at  [facility], [address], [place], [place] [address]  Vancomycin , random     Status: None   Collection Time: 07/17/23  4:32 AM  Result Value Ref Range   Vancomycin  Rm 25 ug/mL    Comment:        Random Vancomycin  therapeutic range is dependent on dosage and time of specimen collection. A peak range is 20-40 ug/mL A trough range is 5-15 ug/mL        Performed at [facility], [address], [place], [place] [address]   HCV Ab w Reflex to Quant PCR     Status: None   Collection Time: 07/17/23  4:32 AM  Result Value Ref Range   HCV Ab Non Reactive Non Reactive    Comment: (NOTE) Performed At: [facility] [address] [place], [place] [id number] Jennette Shorter MD Ey:[id number]   Hepatitis A antibody, total     Status: None  Collection Time: 07/17/23  4:32 AM  Result Value Ref Range   hep A Total Ab NON REACTIVE NON REACTIVE    Comment: Performed at [facility], [address], [place], [place] [address]  Interpretation:     Status: None   Collection Time: 07/17/23  4:32 AM  Result Value Ref Range   HCV Interp 1: Comment     Comment: (NOTE) Not infected with HCV unless early or acute infection is suspected (which may be delayed in an immunocompromised individual), or other evidence exists to indicate HCV infection. Performed At: [facility] [address] [place], [place] [id number] Jennette Shorter MD Ey:[id number]   Glucose, capillary     Status: Abnormal   Collection Time: 07/17/23  7:18 AM  Result Value Ref Range   Glucose-Capillary 244 (H) 70 - 99 mg/dL    Comment: Glucose reference range applies only to samples taken after fasting for at least 8 hours.  Glucose, capillary     Status: Abnormal   Collection Time: 07/17/23  1:21 PM  Result Value Ref Range   Glucose-Capillary 130 (H) 70 - 99 mg/dL    Comment: Glucose reference range applies only to samples taken after fasting for at least 8 hours.  Glucose, capillary      Status: Abnormal   Collection Time: 07/17/23  4:30 PM  Result Value Ref Range   Glucose-Capillary 118 (H) 70 - 99 mg/dL    Comment: Glucose reference range applies only to samples taken after fasting for at least 8 hours.  Glucose, capillary     Status: Abnormal   Collection Time: 07/17/23  9:39 PM  Result Value Ref Range   Glucose-Capillary 125 (H) 70 - 99 mg/dL    Comment: Glucose reference range applies only to samples taken after fasting for at least 8 hours.  CBC with Differential/Platelet     Status: Abnormal   Collection Time: 07/18/23  5:48 AM  Result Value Ref Range   WBC 7.6 4.0 - 10.5 K/uL   RBC 3.31 (L) 4.22 - 5.81 MIL/uL   Hemoglobin 9.0 (L) 13.0 - 17.0 g/dL   HCT 72.2 (L) 60.9 - 47.9 %   MCV 83.7 80.0 - 100.0 fL   MCH 27.2 26.0 - 34.0 pg   MCHC 32.5 30.0 - 36.0 g/dL   RDW 83.8 (H) 88.4 - 84.4 %   Platelets 250 150 - 400 K/uL   nRBC 0.0 0.0 - 0.2 %   Neutrophils Relative % 68 %   Neutro Abs 5.2 1.7 - 7.7 K/uL   Lymphocytes Relative 12 %   Lymphs Abs 0.9 0.7 - 4.0 K/uL   Monocytes Relative 10 %   Monocytes Absolute 0.8 0.1 - 1.0 K/uL   Eosinophils Relative 9 %   Eosinophils Absolute 0.6 (H) 0.0 - 0.5 K/uL   Basophils Relative 1 %   Basophils Absolute 0.1 0.0 - 0.1 K/uL   Immature Granulocytes 0 %   Abs Immature Granulocytes 0.02 0.00 - 0.07 K/uL    Comment: Performed at [facility], [address] [place], [place] [address]  Basic metabolic panel     Status: Abnormal   Collection Time: 07/18/23  5:49 AM  Result Value Ref Range   Sodium 131 (L) 135 - 145 mmol/L   Potassium 4.0 3.5 - 5.1 mmol/L   Chloride 96 (L) 98 - 111 mmol/L   CO2 26 22 - 32 mmol/L   Glucose, Bld 145 (H) 70 - 99 mg/dL  Comment: Glucose reference range applies only to samples taken after fasting for at least 8 hours.   BUN 15 8 - 23 mg/dL   Creatinine, Ser 5.70 (H) 0.61 - 1.24 mg/dL   Calcium  8.6 (L) 8.9 - 10.3 mg/dL   GFR, Estimated 15 (L) >60 mL/min    Comment:  (NOTE) Calculated using the CKD-EPI Creatinine Equation (2021)    Anion gap 9 5 - 15    Comment: Performed at [facility], [address] [place], [place] [address]  Glucose, capillary     Status: Abnormal   Collection Time: 07/18/23  7:27 AM  Result Value Ref Range   Glucose-Capillary 129 (H) 70 - 99 mg/dL    Comment: Glucose reference range applies only to samples taken after fasting for at least 8 hours.   No results found.    Blood pressure (!) 145/63, pulse 80, temperature 97.6 F (36.4 C), temperature source Oral, resp. rate 17, height 5' 11 (1.803 m), weight 65.9 kg, SpO2 100%.  Medical Problem List and Plan: 1. 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  Antithrombotics: -DVT/anticoagulation:  Pharmaceutical: Heparin   -antiplatelet therapy: continue aspirin  325 mg daily  3. Pain Management:  -Tylenol  as needed -Norco as needed 5-10 mg q4 hours- might need Oxy if not enough- was taking IV Dilaudid  solely on Acute.   -Flexeril  10 mg TID as needed  4. Mood/Behavior/Sleep: LCSW to evaluate and provide emotional support  -continue melatonin 5 mg q HS  -antipsychotic agents: n/a  5. Neuropsych/cognition: This patient is capable of making decisions on his own behalf.  6. Skin/Wound Care: Routine skin care checks  -monitor surgical incisions   7. Fluids/Electrolytes/Nutrition: Strict Is and Os and follow-up chemistries per nephrology  8: Hypertension: monitor TID and prn  -continue amlodipine  5 mg daily  -continue lisinopril  20 mg BID  9: Hyperlipidemia: continue statin  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 , Cosopt , Xalantan  11: ESRD: HD>>M,W,F via right internal jugular TDC  -continue calcitrol 0.5 mcg M,W,F  -continue Aranesp  60 mcg  every Wednesday  -continue Fosrenol  1,000 mg TID with meals  12: DM-2: A1c = 5.8%; at home on Tresiba  Flextouch 12u at bedtime  -continue SSI  -continue Semglee  2 units q HS  13: Constipation: continue MiraLax  daily, Senokot-S BID; prns ordered  14: Discitis/osteomyelitis with severe stenosis and myelopathy:  -continue vancomycin  750 mg with HD through 08/26/2023- will likely change to Chronic Doxycycline  after that, but need to reach out to ID when close to d/c to verify  -follow-up with ID  15: C6, C7 discitis/osteomyelitis with severe stenosis and myelopathy  -s/p C5, C7 corpectomy with anterior and posterior fixation on 07/15/2023   -continue cervical collar>>may remove when in bed, to shower and may ambulate to BR without it; apply and remove while sitting  -follow-up with Dr. Louis  16: History of prior CVA: on aspirin  and statin  17. Neurogenic bowel- LBM 9 days ago- will order Sorbitol to get him to go.  If doesn't work, will need soap suds enema, etc- might need KUB  @Sandra  J Setzer, PA-C 07/18/2023   I have personally performed a face to face diagnostic evaluation of this patient and formulated the key components of the plan.  Additionally, I have personally reviewed laboratory data, imaging studies,  as well as relevant notes and concur with the physician assistant's documentation above.   The patient's status has not changed from the original H&P.  Any changes in documentation from the acute care chart have been noted above.

## 2023-07-18 NOTE — Progress Notes (Addendum)
 Inpatient Rehabilitation Admissions Coordinator    I met with patient at bedside and spoke with his wife by phone to review goals and expectations of rehab.They are in agreement to admit. Acute team and TOC made aware. I will make the arrangements. Dorothe in hemodialysis made aware.  Heron Leavell, RN, MSN Rehab Admissions Coordinator [phone] 07/18/2023 10:45 AM

## 2023-07-18 NOTE — Progress Notes (Signed)
 Overall doing well.  Minimal pain.  Neuroexam stable.  Wounds clean and dry.  Doing quite well following anterior posterior cervical decompression and fusion.  Continue efforts at mobilization.  Okay for discharge to rehab whenever bed available.

## 2023-07-18 NOTE — Discharge Summary (Signed)
 Physician Discharge Summary  Shawn [name] FMW:[id number] DOB: 27-Dec-1960 DOA: 07/08/2023  PCP: Hillman Bare, MD  Admit date: 07/08/2023 Discharge date: 07/18/2023  Admitted From: Home Disposition:  Home  Discharge Condition:Stable CODE STATUS:FULL Diet recommendation: Renal  Brief/Interim Summary: Patient is a 63 year old male with history of ESRD on dialysis MWF schedule, hypertension, diabetes type 2, hyperlipidemia, blindness who presented here on 12/31 with 2-week history of neck pain rating down upper back.  No history of trauma.  MRI of the cervical spine showed possible discitis/osteomyelitis C6/C7, vertebral body height loss and retropulsion with severe, stenosis, compression of spinal cord.  Neurosurgery consulted.  Status post anterior posterior decompression and fusion for treatment of cervical discitis with pathological fracture.  ID was following.  Currently on vancomycin .  PT recommended acute inpatient rehab . Medically stable for discharge .  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  MRI as above.Status post anterior posterior decompression and fusion for treatment of cervical discitis with pathological fracture.  ID was following.  Currently on vancomycin .  ID recommended vancomycin  during dialysis: End date 08/26/2023.  ID will follow him as an outpatient and probably put him on chronic doxycycline  after he finishes IV antibiotics course.  Continue pain management, supportive care.  Continue cervical collar PT recommending AIR on discharge  Chronic HFrEF: Appears euvolemic.  Last echo done on 1/325 showed EF of 25 to 30%.  Previous echo done on 12/23/2027 showed EF of 45 to 50%.  Continue Coreg , lisinopril  for now.  He needs to follow-up with cardiology as an outpatient.  He follows with  Dr. Lonni Slain.  I have sent a message for arranging an outpatient follow-up.   ESRD on dialysis: Has dialysis catheter on right chest.  On Fosrenol , Rocaltrol .  Nephrology following   Diabetes type 2: Recent A1c of 5.8.  On Tresiba  at home.  Decrease to 5 units daily   Hypertension: On amlodipine , Zestril  at home.  Amlodipine  discontinued due to low EF, started on Coreg    Blindness: Secondary to diabetic retinopathy.  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   Increase activity slowly   Complete by: As directed    No wound care   Complete by: As directed       Allergies as of 07/18/2023   No Known Allergies      Medication List     STOP taking these medications    amLODipine  5 MG tablet Commonly known as: NORVASC        TAKE these medications    acetaminophen  500 MG tablet Commonly known as: TYLENOL  Take 1,000 mg by mouth once as needed.   aspirin  EC 325 MG tablet Take 325 mg by mouth at bedtime.  b complex vitamins tablet Take 1 tablet by mouth at bedtime.   brimonidine  0.2 % ophthalmic solution Commonly known as: ALPHAGAN  Place 1 drop into both eyes 2 (two) times daily.   calcitRIOL  0.5 MCG capsule Commonly known as: ROCALTROL  Take 1 capsule (0.5 mcg total) by mouth 3 (three) times a  week. What changed: when to take this   Darbepoetin Alfa  100 MCG/0.5ML Sosy injection Commonly known as: ARANESP  Inject 0.5 mLs (100 mcg total) into the skin every Friday at 6 PM.   dorzolamide -timolol  2-0.5 % ophthalmic solution Commonly known as: COSOPT  Place 1 drop into both eyes 2 (two) times daily.   HYDROcodone -acetaminophen  10-325 MG tablet Commonly known as: NORCO Take 2 tablets by mouth every 4 (four) hours as needed for severe pain (pain score 7-10) ((score 7 to 10)).   lanthanum  1000 MG chewable tablet Commonly known as: FOSRENOL  Chew 1,000 mg by mouth 3 (three) times daily with meals.   latanoprost  0.005 % ophthalmic solution Commonly known as: XALATAN  Place 1 drop into both eyes at bedtime.   lisinopril  20 MG tablet Commonly known as: ZESTRIL  Take 20 mg by mouth 2 (two) times daily.   melatonin 5 MG Tabs Take 1 tablet (5 mg total) by mouth at bedtime.   MIRCERA IJ Inject 1 Dose into the skin every 14 (fourteen) days.   polyethylene glycol 17 g packet Commonly known as: MIRALAX  / GLYCOLAX  Take 17 g by mouth daily. Start taking on: July 19, 2023   rosuvastatin  40 MG tablet Commonly known as: CRESTOR  Take 40 mg by mouth daily.   senna-docusate 8.6-50 MG tablet Commonly known as: Senokot-S Take 1 tablet by mouth 2 (two) times daily.   Tresiba  FlexTouch 100 UNIT/ML FlexTouch Pen Generic drug: insulin  degludec Inject 5 Units into the skin at bedtime. What changed: how much to take   vancomycin  IVPB Inject 750 mg into the vein every Monday, Wednesday, and Friday with hemodialysis. Indication:  MRSE discitis First Dose: No Last Day of Therapy:  08/26/23 Labs - Sunday/Monday:  CBC/D, BMP, and vancomycin  trough. Labs - Thursday:  BMP and vancomycin  trough Labs - Once weekly: ESR and CRP Method of administration:Elastomeric Method of administration may be changed at the discretion of the patient and/or caregiver's ability to self-administer the  medication ordered. Start taking on: July 19, 2023               Home Infusion Instuctions  (From admission, onward)           Start     Ordered   07/18/23 0000  Home infusion instructions       Question:  Instructions  Answer:  Flushing of vascular access device: 0.9% NaCl pre/post medication administration and prn patency; Heparin  100 u/ml, 5ml for implanted ports and Heparin  10u/ml, 5ml for all other central venous catheters.   07/18/23 1135            No Known Allergies  Consultations: ID, orthopedics   Procedures/Studies: DG Cervical Spine 2 or 3 views Result Date: 07/15/2023 CLINICAL DATA:  C6-C7 corpectomy and posterior cervical fusion C5-T1 EXAM: CERVICAL SPINE - 2-3 VIEW FLUOROSCOPY TIME:  Fluoroscopy Time:  26.1 seconds Radiation Exposure Index (if provided by the fluoroscopic device): 7.7 mGy Number of Acquired Spot Images: 7 COMPARISON:  MRI 07/09/2023 FINDINGS: Multiple intraoperative fluoroscopic spot images are provided without a radiologist present. IMPRESSION: Intraoperative fluoroscopic images during C6-C7 corpectomy and posterior cervical fusion C5-T1. See operative report for details. Electronically Signed  By: Norman Gatlin M.D.   On: 07/15/2023 23:18   DG C-Arm 1-60 Min-No Report Result Date: 07/15/2023 Fluoroscopy was utilized by the requesting physician.  No radiographic interpretation.   IR Fluoro Guide CV Line Right Result Date: 07/13/2023 INDICATION: 63 year old male with a history of renal failure, presents with bacteremia/infection and request for a line holiday. Cervical MR 07/09/2023 demonstrates discitis/osteomyelitis C6-C7 and associated severe spinal stenosis. Upon review of prior images, it appears that the patient has limited upper extremity venous access, with evidence of occluded internal jugular veins bilaterally. Prior chest CT 12/19/2022 demonstrates an occlusive fibrin sheath of the right brachiocephalic vein above the SVC (image  46/122 series 7). Plan is for exchange for a new catheter at the right IJ site and treatment through the infection. EXAM: IMAGE GUIDED EXCHANGE OF RIGHT INTERNAL JUGULAR VEIN TUNNELED HEMODIALYSIS CATHETER BALLOON ANGIOPLASTY OCCLUSIVE FIBRIN SHEATH LIMITED VENOGRAM MEDICATIONS: 2 g Ancef . The antibiotic was given in an appropriate time interval prior to skin puncture. ANESTHESIA/SEDATION: Moderate (conscious) sedation was not employed during this procedure. FLUOROSCOPY: Radiation Exposure Index (as provided by the fluoroscopic device): 17 mGy Kerma COMPLICATIONS: None PROCEDURE: Informed written consent was obtained from the patient after a discussion of the risks, benefits, and alternatives to treatment. Questions regarding the procedure were encouraged and answered. The right neck and chest including indwelling catheter were prepped with chlorhexidine  in a sterile fashion, and a sterile drape was applied covering the operative field. Maximum barrier sterile technique with sterile gowns and gloves were used for the procedure. A timeout was performed prior to the initiation of the procedure. Upon initial inspection the subcutaneous tunnel is quite short, at the level of the clavicle just above the IJ puncture site. Initial images stored. 1% lidocaine  was used for local anesthesia. Both the red and blue ports were aspirated clearing the heparin . Aspirated easily. A stiff Glidewire was advanced to the IVC. A pull-back wire technique was used to estimate the internal length, which was a proximally 24 cm. We elected to place a 23 cm tip to cuff catheter. Glidewire was then again advanced to the IVC. Blunt dissection was used to free the cuff from the soft tissue tract. The catheter was withdrawn with some significant tension, apparently from the occlusive fibrin sheath of the brachiocephalic vein. Once the catheter was removed, the new 23 cm tip to cuff catheter was advanced on the wire. The catheter would not advanced  through the occlusive fibrin sheath. Balloon angioplasty was required. A 9 French sheath was advanced on the Glidewire. A pull-back venogram was performed, both under fluoroscopic observation and also with DSA imaging. This demonstrates the occlusive fibrin sheath at the brachiocephalic vein. 10 mm x 40 mm balloon was then advanced into the fibrin sheath. The balloon was inflated to profile, with maximum atmospheric pressure of 12 atmospheres achieved. There was a small waist persisting in the SVC fibrin sheath, approximately 25-30% diameter reduction based on the profile of the balloon. Balloon was then removed. After the balloon angioplasty the new catheter advanced easily through the brachiocephalic vein. Final catheter positioning was confirmed and documented with a spot radiographic image. The catheter aspirates and flushes normally. The catheter was flushed with appropriate volume heparin  dwells. The catheter exit site was secured with a 0-Prolene retention suture. Dressings were applied. The patient tolerated the procedure well without immediate post procedural complication. IMPRESSION: Status post image guided exchange of right IJ tunneled hemodialysis catheter, requiring limited venogram and balloon angioplasty of occlusive fibrin sheath  at the right brachiocephalic vein as above. Would advise careful consideration of any removal of this catheter without a planned angioplasty or other plan. Potentially a new right external jugular vein access site would be possible using a buddy wire/safety wire technique through the occlusive fibrin sheath. Signed, Ami RAMAN. Alona ROSALEA GRAVER, RPVI [facility] Radiology Electronically Signed   By: Ami Alona D.O.   On: 07/13/2023 07:38   IR PTA VENOUS EXCEPT DIALYSIS CIRCUIT Result Date: 07/13/2023 INDICATION: 63 year old male with a history of renal failure, presents with bacteremia/infection and request for a line  holiday. Cervical MR 07/09/2023 demonstrates discitis/osteomyelitis C6-C7 and associated severe spinal stenosis. Upon review of prior images, it appears that the patient has limited upper extremity venous access, with evidence of occluded internal jugular veins bilaterally. Prior chest CT 12/19/2022 demonstrates an occlusive fibrin sheath of the right brachiocephalic vein above the SVC (image 46/122 series 7). Plan is for exchange for a new catheter at the right IJ site and treatment through the infection. EXAM: IMAGE GUIDED EXCHANGE OF RIGHT INTERNAL JUGULAR VEIN TUNNELED HEMODIALYSIS CATHETER BALLOON ANGIOPLASTY OCCLUSIVE FIBRIN SHEATH LIMITED VENOGRAM MEDICATIONS: 2 g Ancef . The antibiotic was given in an appropriate time interval prior to skin puncture. ANESTHESIA/SEDATION: Moderate (conscious) sedation was not employed during this procedure. FLUOROSCOPY: Radiation Exposure Index (as provided by the fluoroscopic device): 17 mGy Kerma COMPLICATIONS: None PROCEDURE: Informed written consent was obtained from the patient after a discussion of the risks, benefits, and alternatives to treatment. Questions regarding the procedure were encouraged and answered. The right neck and chest including indwelling catheter were prepped with chlorhexidine  in a sterile fashion, and a sterile drape was applied covering the operative field. Maximum barrier sterile technique with sterile gowns and gloves were used for the procedure. A timeout was performed prior to the initiation of the procedure. Upon initial inspection the subcutaneous tunnel is quite short, at the level of the clavicle just above the IJ puncture site. Initial images stored. 1% lidocaine  was used for local anesthesia. Both the red and blue ports were aspirated clearing the heparin . Aspirated easily. A stiff Glidewire was advanced to the IVC. A pull-back wire technique was used to estimate the internal length, which was a proximally 24 cm. We elected to place a 23  cm tip to cuff catheter. Glidewire was then again advanced to the IVC. Blunt dissection was used to free the cuff from the soft tissue tract. The catheter was withdrawn with some significant tension, apparently from the occlusive fibrin sheath of the brachiocephalic vein. Once the catheter was removed, the new 23 cm tip to cuff catheter was advanced on the wire. The catheter would not advanced through the occlusive fibrin sheath. Balloon angioplasty was required. A 9 French sheath was advanced on the Glidewire. A pull-back venogram was performed, both under fluoroscopic observation and also with DSA imaging. This demonstrates the occlusive fibrin sheath at the brachiocephalic vein. 10 mm x 40 mm balloon was then advanced into the fibrin sheath. The balloon was inflated to profile, with maximum atmospheric pressure of 12 atmospheres achieved. There was a small waist persisting in the SVC fibrin sheath, approximately 25-30% diameter reduction based on the profile of the balloon. Balloon was then removed. After the balloon angioplasty the new catheter advanced easily through the brachiocephalic vein. Final catheter positioning was confirmed and documented with a spot radiographic image. The catheter aspirates and flushes normally. The catheter was flushed with appropriate volume heparin  dwells. The catheter exit  site was secured with a 0-Prolene retention suture. Dressings were applied. The patient tolerated the procedure well without immediate post procedural complication. IMPRESSION: Status post image guided exchange of right IJ tunneled hemodialysis catheter, requiring limited venogram and balloon angioplasty of occlusive fibrin sheath at the right brachiocephalic vein as above. Would advise careful consideration of any removal of this catheter without a planned angioplasty or other plan. Potentially a new right external jugular vein access site would be possible using a buddy wire/safety wire technique through the  occlusive fibrin sheath. Signed, Ami RAMAN. Alona ROSALEA GRAVER, RPVI [facility] Radiology Electronically Signed   By: Ami Alona D.O.   On: 07/13/2023 07:38   ECHOCARDIOGRAM COMPLETE Result Date: 07/12/2023    ECHOCARDIOGRAM REPORT   Patient Name:   Shawn [name] Date of Exam: 07/12/2023 Medical Rec #:  [id number]            Height:       71.0 in Accession #:    [id number]           Weight:       147.7 lb Date of Birth:  1960-12-03            BSA:          1.854 m Patient Age:    62 years             BP:           169/78 mmHg Patient Gender: M                    HR:           90 bpm. Exam Location:  Inpatient Procedure: 2D Echo, 3D Echo, Cardiac Doppler, Color Doppler, Strain Analysis and            Intracardiac Opacification Agent Indications:    Bacteremia  History:        Patient has no prior history of Echocardiogram examinations.                 Risk Factors:Diabetes and Dyslipidemia.  Sonographer:    Ozell Free Referring Phys: [id number] CORNELIUS N VAN DAM  Sonographer Comments: No subcostal window. Image acquisition challenging due to patient body habitus. Global longitudinal strain was attempted. IMPRESSIONS  1. Left ventricular ejection fraction, by estimation, is 25 to 30%. The left ventricle has severely decreased function. The left ventricle demonstrates global hypokinesis. Indeterminate diastolic filling due to E-A fusion.  2. Right ventricular systolic function is mildly reduced. The right ventricular size is normal. There is severely elevated pulmonary artery systolic pressure.  3. Left atrial size was mildly dilated.  4. The mitral valve is normal in structure. No evidence of mitral valve regurgitation. No evidence of mitral stenosis.  5. The aortic valve is tricuspid. There is moderate calcification of the aortic valve. There is mild thickening of the aortic valve. Aortic valve regurgitation is not visualized. Aortic valve sclerosis/calcification is  present, without any evidence of aortic stenosis. Conclusion(s)/Recommendation(s): No evidence of valvular vegetations on this transthoracic echocardiogram. Consider a transesophageal echocardiogram to exclude infective endocarditis if clinically indicated. FINDINGS  Left Ventricle: Left ventricular ejection fraction, by estimation, is 25 to 30%. The left ventricle has severely decreased function. The left ventricle demonstrates global hypokinesis. Definity  contrast agent was given IV to delineate the left ventricular endocardial borders. The left ventricular internal cavity size was normal in size. There is no left ventricular hypertrophy. Indeterminate diastolic filling due  to E-A fusion. Right Ventricle: The right ventricular size is normal. No increase in right ventricular wall thickness. Right ventricular systolic function is mildly reduced. There is severely elevated pulmonary artery systolic pressure. The tricuspid regurgitant velocity is 3.85 m/s, and with an assumed right atrial pressure of 3 mmHg, the estimated right ventricular systolic pressure is 62.3 mmHg. Left Atrium: Left atrial size was mildly dilated. Right Atrium: Right atrial size was normal in size. Pericardium: There is no evidence of pericardial effusion. Mitral Valve: The mitral valve is normal in structure. There is mild calcification of the mitral valve leaflet(s). No evidence of mitral valve regurgitation. No evidence of mitral valve stenosis. Tricuspid Valve: The tricuspid valve is normal in structure. Tricuspid valve regurgitation is not demonstrated. No evidence of tricuspid stenosis. Aortic Valve: The aortic valve is tricuspid. There is moderate calcification of the aortic valve. There is mild thickening of the aortic valve. There is moderate aortic valve annular calcification. Aortic valve regurgitation is not visualized. Aortic valve sclerosis/calcification is present, without any evidence of aortic stenosis. Aortic valve mean  gradient measures 5.0 mmHg. Aortic valve peak gradient measures 8.8 mmHg. Aortic valve area, by VTI measures 1.66 cm. Pulmonic Valve: The pulmonic valve was not well visualized. Pulmonic valve regurgitation is trivial. No evidence of pulmonic stenosis. Aorta: The aortic root is normal in size and structure. Venous: The inferior vena cava was not well visualized. IAS/Shunts: No atrial level shunt detected by color flow Doppler.  LEFT VENTRICLE PLAX 2D LVIDd:         5.60 cm      Diastology LVIDs:         4.50 cm      LV e' medial:    4.03 cm/s LV PW:         0.90 cm      LV E/e' medial:  18.5 LV IVS:        0.80 cm      LV e' lateral:   3.59 cm/s LVOT diam:     2.00 cm      LV E/e' lateral: 20.8 LV SV:         49 LV SV Index:   26 LVOT Area:     3.14 cm                              3D Volume EF: LV Volumes (MOD)            3D EF:        28 % LV vol d, MOD A2C: 133.0 ml LV EDV:       205 ml LV vol d, MOD A4C: 111.0 ml LV ESV:       148 ml LV vol s, MOD A2C: 90.7 ml  LV SV:        57 ml LV vol s, MOD A4C: 70.2 ml LV SV MOD A2C:     42.3 ml LV SV MOD A4C:     111.0 ml LV SV MOD BP:      37.0 ml RIGHT VENTRICLE RV Basal diam:  3.70 cm RV S prime:     11.60 cm/s TAPSE (M-mode): 2.0 cm LEFT ATRIUM             Index        RIGHT ATRIUM           Index LA diam:        3.50 cm  1.89 cm/m   RA Area:     12.30 cm LA Vol (A2C):   64.9 ml 35.01 ml/m  RA Volume:   33.00 ml  17.80 ml/m LA Vol (A4C):   41.2 ml 22.23 ml/m LA Biplane Vol: 56.1 ml 30.26 ml/m  AORTIC VALVE AV Area (Vmax):    1.80 cm AV Area (Vmean):   1.56 cm AV Area (VTI):     1.66 cm AV Vmax:           148.00 cm/s AV Vmean:          104.000 cm/s AV VTI:            0.293 m AV Peak Grad:      8.8 mmHg AV Mean Grad:      5.0 mmHg LVOT Vmax:         84.90 cm/s LVOT Vmean:        51.500 cm/s LVOT VTI:          0.155 m LVOT/AV VTI ratio: 0.53  AORTA Ao Root diam: 2.80 cm Ao Asc diam:  2.40 cm MITRAL VALVE                TRICUSPID VALVE MV Area (PHT): 9.73 cm      TR Peak grad:   59.3 mmHg MV Decel Time: 78 msec      TR Vmax:        385.00 cm/s MV E velocity: 74.60 cm/s MV A velocity: 119.00 cm/s  SHUNTS MV E/A ratio:  0.63         Systemic VTI:  0.16 m                             Systemic Diam: 2.00 cm Toribio Fuel MD Electronically signed by Toribio Fuel MD Signature Date/Time: 07/12/2023/3:35:11 PM    Final    MR Cervical Spine W and Wo Contrast Result Date: 07/09/2023 CLINICAL DATA:  Osteomyelitis, cervical; Osteomyelitis, thoracic patient with neck pain to upper back ct with ? infection. EXAM: MRI CERVICAL AND THORACIC SPINE WITHOUT AND WITH CONTRAST TECHNIQUE: Multiplanar and multiecho pulse sequences of the cervical spine, to include the craniocervical junction and cervicothoracic junction, and the thoracic spine, were obtained without and with intravenous contrast. CONTRAST:  7mL GADAVIST  GADOBUTROL  1 MMOL/ML IV SOLN COMPARISON:  CT cervical spine 07/09/2023. FINDINGS: MRI CERVICAL SPINE FINDINGS Alignment: Destruction and collapse of the C6 and C7 vertebral bodies results in slight retrolisthesis of C6 on C7. 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 Trace prevertebral edema. No evidence of epidural abscess. Cord: Severe compression of the spinal cord at the C6-7 level. Posterior Fossa, vertebral arteries, paraspinal tissues: As above. Otherwise, moderate prevertebral phlegmon at C6-7. Disc levels: C2-C3:  Normal. C3-C4: Central disc protrusion and ligamentum flavum buckling results in moderate spinal canal stenosis. Facet arthropathy and uncovertebral joint spurring results in severe bilateral neural foraminal narrowing. C4-C5: Disc bulge and ligamentum flavum buckling results in moderate-to-severe spinal canal stenosis. Facet arthropathy and uncovertebral joint  spurring contribute to severe bilateral neural foraminal narrowing. C5-C6: Disc bulge results in mild spinal canal stenosis. Left-greater-than-right facet arthropathy and uncovertebral joint spurring results in severe left and moderate right neural foraminal narrowing. C6-C7: Collapse of the vertebral bodies, retrolisthesis and retropulsion of the posterior cortex of C6 results in severe  spinal canal stenosis and severe bilateral neural foraminal narrowing. C7-T1:  Unremarkable. MRI THORACIC SPINE FINDINGS Alignment:  Normal. Vertebrae: No fracture evidence of discitis-osteomyelitis. Acute appearing degenerative Schmorl's nodes at T11-12 and T12-L1. Cord: Normal spinal cord signal and volume. No abnormal enhancement. Paraspinal and other soft tissues: Polycystic kidneys. Disc levels: No significant disc herniation, spinal canal stenosis or neural foraminal narrowing. IMPRESSION: 1. 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 Severe spinal canal stenosis with severe compression of the spinal cord and severe bilateral neural foraminal narrowing at this level. No evidence of epidural abscess. 2. No evidence of discitis-osteomyelitis in the thoracic spine. 3. Acute appearing degenerative Schmorl's nodes at T11-12 and T12-L1. 4. Multilevel cervical spondylosis, worst at C4-5 where there is moderate-to-severe spinal canal stenosis and severe bilateral neural foraminal narrowing. Electronically Signed   By: Ryan Chess M.D.   On: 07/09/2023 12:23   MR THORACIC SPINE W WO CONTRAST Result Date: 07/09/2023 CLINICAL DATA:  Osteomyelitis, cervical; Osteomyelitis, thoracic patient with neck pain to upper back ct with ? infection. EXAM: MRI CERVICAL AND THORACIC SPINE WITHOUT AND WITH CONTRAST TECHNIQUE: Multiplanar and multiecho pulse sequences of the cervical spine, to  include the craniocervical junction and cervicothoracic junction, and the thoracic spine, were obtained without and with intravenous contrast. CONTRAST:  7mL GADAVIST  GADOBUTROL  1 MMOL/ML IV SOLN COMPARISON:  CT cervical spine 07/09/2023. FINDINGS: MRI CERVICAL SPINE FINDINGS Alignment: Destruction and collapse of the C6 and C7 vertebral bodies results in slight retrolisthesis of C6 on C7. 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 Trace prevertebral edema. No evidence of epidural abscess. Cord: Severe compression of the spinal cord at the C6-7 level. Posterior Fossa, vertebral arteries, paraspinal tissues: As above. Otherwise, moderate prevertebral phlegmon at C6-7. Disc levels: C2-C3:  Normal. C3-C4: Central disc protrusion and ligamentum flavum buckling results in moderate spinal canal stenosis. Facet arthropathy and uncovertebral joint spurring results in severe bilateral neural foraminal narrowing. C4-C5: Disc bulge and ligamentum flavum buckling results in moderate-to-severe spinal canal stenosis. Facet arthropathy and uncovertebral joint spurring contribute to severe bilateral neural foraminal narrowing. C5-C6: Disc bulge results in mild spinal canal stenosis. Left-greater-than-right facet arthropathy and uncovertebral joint spurring results in severe left and moderate right neural foraminal narrowing. C6-C7: Collapse of the vertebral bodies, retrolisthesis and retropulsion of the posterior cortex of C6 results in severe spinal canal stenosis and severe bilateral neural foraminal narrowing. C7-T1:  Unremarkable. MRI THORACIC SPINE FINDINGS Alignment:  Normal. Vertebrae: No fracture evidence of discitis-osteomyelitis. Acute appearing degenerative Schmorl's nodes at T11-12 and T12-L1. Cord: Normal spinal cord signal and  volume. No abnormal enhancement. Paraspinal and other soft tissues: Polycystic kidneys. Disc levels: No significant disc herniation, spinal canal stenosis or neural foraminal narrowing. IMPRESSION: 1. 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 Severe spinal canal stenosis with severe compression of the spinal cord and severe bilateral neural foraminal narrowing at this level. No evidence of epidural abscess. 2. No evidence of discitis-osteomyelitis in the thoracic spine. 3. Acute appearing degenerative Schmorl's nodes at T11-12 and T12-L1. 4. Multilevel cervical spondylosis, worst at C4-5 where there is moderate-to-severe spinal canal stenosis and severe bilateral neural foraminal narrowing. Electronically Signed  By: Ryan Chess M.D.   On: 07/09/2023 12:23   DG Chest 1 View Result Date: 07/09/2023 CLINICAL DATA:  ESRD EXAM: CHEST  1 VIEW COMPARISON:  Chest radiograph dated December 22, 2022. FINDINGS: The heart size and mediastinal contours are within normal limits. Right IJ hemodialysis catheter tip overlies the mid right atrium, similar to the prior exam. Small right-greater-than-left pleural effusions with associated basilar atelectasis. Streaky opacities at the right lung base. Linear scarring/atelectasis in the mid left lung. No definite pneumothorax. Vascular stent in the right upper arm. No acute osseous abnormality. IMPRESSION: 1. Small bilateral right-greater-than-left pleural effusions. 2. Streaky opacities at the right lung base could represent atelectasis or infiltrate. Electronically Signed   By: Harrietta Sherry M.D.   On: 07/09/2023 09:48   CT Cervical Spine Wo Contrast Result Date: 07/09/2023 CLINICAL DATA:  Acute neck pain EXAM: CT CERVICAL SPINE WITHOUT CONTRAST TECHNIQUE: Multidetector CT imaging of the cervical spine was performed without  intravenous contrast. Multiplanar CT image reconstructions were also generated. RADIATION DOSE REDUCTION: This exam was performed according to the departmental dose-optimization program which includes automated exposure control, adjustment of the mA and/or kV according to patient size and/or use of iterative reconstruction technique. COMPARISON:  None Available. FINDINGS: Alignment: There is mild anterior facet subluxation at C6-7. Skull base and vertebrae: Severe erosive changes of C6 and C7. No acute fracture. The other vertebral bodies are unremarkable. Soft tissues and spinal canal: No prevertebral fluid or swelling. No visible canal hematoma. Disc levels: There is severe spinal canal stenosis at the C6-7 level Upper chest: Biapical pleural thickening with suspected loculated effusions. Other: None. IMPRESSION: 1. Severe erosive changes of C6 and C7 with severe spinal canal stenosis at the C6-7 level. This is concerning for discitis/osteomyelitis. MRI with and without contrast is recommended for further evaluation. 2. Biapical pleural thickening with suspected loculated effusions. Electronically Signed   By: Franky Stanford M.D.   On: 07/09/2023 02:25      Subjective: Patient seen and examined at bedside today.  Hemodynamically stable.  Appears comfortable, lying in bed.  No bowel movement yet but denies any abdomen pain, nausea or vomiting.  Ordered Dulcolax suppository.  Patient has been discharged to CIR today  Discharge Exam: Vitals:   07/18/23 0545 07/18/23 0726  BP: (!) 151/72 (!) 145/63  Pulse: 82 80  Resp: 18 17  Temp: 98.2 F (36.8 C) 97.6 F (36.4 C)  SpO2: 100% 100%   Vitals:   07/17/23 1529 07/17/23 2137 07/18/23 0545 07/18/23 0726  BP: (!) 140/64 (!) 153/71 (!) 151/72 (!) 145/63  Pulse: 85 85 82 80  Resp: 16 18 18 17   Temp: (!) 97.5 F (36.4 C) 98.5 F (36.9 C) 98.2 F (36.8 C) 97.6 F (36.4 C)  TempSrc: Oral   Oral  SpO2: 100% 99% 100% 100%  Weight:      Height:         General: Pt is alert, awake, not in acute distress, cervical collar, dressing on the neck Cardiovascular: RRR, S1/S2 +, no rubs, no gallops, dialysis catheter on right chest Respiratory: CTA bilaterally, no wheezing, no rhonchi Abdominal: Soft, NT, ND, bowel sounds + Extremities: no edema, no cyanosis    The results of significant diagnostics from this hospitalization (including imaging, microbiology, ancillary and laboratory) are listed below for reference.     Microbiology: Recent Results (from the past 240 hours)  Culture, blood (Routine X 2) w Reflex to ID Panel     Status: Abnormal  Collection Time: 07/09/23  3:23 PM   Specimen: BLOOD  Result Value Ref Range Status   Specimen Description BLOOD SITE NOT SPECIFIED  Final   Special Requests   Final    BOTTLES DRAWN AEROBIC AND ANAEROBIC Blood Culture adequate volume   Culture  Setup Time   Final    GRAM POSITIVE COCCI IN CLUSTERS ANAEROBIC BOTTLE ONLY CRITICAL VALUE NOTED.  VALUE IS CONSISTENT WITH PREVIOUSLY REPORTED AND CALLED VALUE. Performed at [facility], [address], [place], [place] [address]    Culture STAPHYLOCOCCUS EPIDERMIDIS (A)  Final   Report Status 07/13/2023 FINAL  Final   Organism ID, Bacteria STAPHYLOCOCCUS EPIDERMIDIS  Final      Susceptibility   Staphylococcus epidermidis - MIC*    CIPROFLOXACIN <=0.5 SENSITIVE Sensitive     ERYTHROMYCIN  >=8 RESISTANT Resistant     GENTAMICIN <=0.5 SENSITIVE Sensitive     OXACILLIN >=4 RESISTANT Resistant     TETRACYCLINE >=16 RESISTANT Resistant     VANCOMYCIN  1 SENSITIVE Sensitive     TRIMETH /SULFA  <=10 SENSITIVE Sensitive     CLINDAMYCIN <=0.25 SENSITIVE Sensitive     RIFAMPIN <=0.5 SENSITIVE Sensitive     Inducible Clindamycin NEGATIVE Sensitive     * STAPHYLOCOCCUS EPIDERMIDIS  Culture, blood (Routine X 2) w Reflex to ID Panel     Status: Abnormal   Collection Time: 07/09/23  3:39 PM   Specimen: BLOOD  Result Value Ref Range Status   Specimen  Description BLOOD SITE NOT SPECIFIED  Final   Special Requests   Final    BOTTLES DRAWN AEROBIC AND ANAEROBIC Blood Culture adequate volume   Culture  Setup Time   Final    GRAM POSITIVE COCCI IN CLUSTERS IN BOTH AEROBIC AND ANAEROBIC BOTTLES CRITICAL RESULT CALLED TO, READ BACK BY AND VERIFIED WITH: PHARMD JENNY ZHOU [phone], ADC CRITICAL VALUE NOTED.  VALUE IS CONSISTENT WITH PREVIOUSLY REPORTED AND CALLED VALUE. Performed at [facility], [address], [place], [place] [address]    Culture STAPHYLOCOCCUS EPIDERMIDIS (A)  Final   Report Status 07/12/2023 FINAL  Final   Organism ID, Bacteria STAPHYLOCOCCUS EPIDERMIDIS  Final      Susceptibility   Staphylococcus epidermidis - MIC*    CIPROFLOXACIN <=0.5 SENSITIVE Sensitive     ERYTHROMYCIN  >=8 RESISTANT Resistant     GENTAMICIN <=0.5 SENSITIVE Sensitive     OXACILLIN >=4 RESISTANT Resistant     TETRACYCLINE >=16 RESISTANT Resistant     VANCOMYCIN  2 SENSITIVE Sensitive     TRIMETH /SULFA  <=10 SENSITIVE Sensitive     CLINDAMYCIN <=0.25 SENSITIVE Sensitive     RIFAMPIN <=0.5 SENSITIVE Sensitive     Inducible Clindamycin NEGATIVE Sensitive     * STAPHYLOCOCCUS EPIDERMIDIS  Blood Culture ID Panel (Reflexed)     Status: Abnormal   Collection Time: 07/09/23  3:39 PM  Result Value Ref Range Status   Enterococcus faecalis NOT DETECTED NOT DETECTED Final   Enterococcus Faecium NOT DETECTED NOT DETECTED Final   Listeria monocytogenes NOT DETECTED NOT DETECTED Final   Staphylococcus species DETECTED (A) NOT DETECTED Final    Comment: CRITICAL RESULT CALLED TO, READ BACK BY AND VERIFIED WITH: PHARMD JENNY ZHOU [phone], ADC    Staphylococcus aureus (BCID) NOT DETECTED NOT DETECTED Final   Staphylococcus epidermidis DETECTED (A) NOT DETECTED Final    Comment: Methicillin (oxacillin) resistant coagulase negative staphylococcus. Possible blood culture contaminant (unless isolated from more than one blood culture draw or clinical  case suggests pathogenicity).  No antibiotic treatment is indicated for blood  culture contaminants. CRITICAL RESULT CALLED TO, READ BACK BY AND VERIFIED WITH: PHARMD JENNY ZHOU [phone], ADC    Staphylococcus lugdunensis NOT DETECTED NOT DETECTED Final   Streptococcus species NOT DETECTED NOT DETECTED Final   Streptococcus agalactiae NOT DETECTED NOT DETECTED Final   Streptococcus pneumoniae NOT DETECTED NOT DETECTED Final   Streptococcus pyogenes NOT DETECTED NOT DETECTED Final   A.calcoaceticus-baumannii NOT DETECTED NOT DETECTED Final   Bacteroides fragilis NOT DETECTED NOT DETECTED Final   Enterobacterales NOT DETECTED NOT DETECTED Final   Enterobacter cloacae complex NOT DETECTED NOT DETECTED Final   Escherichia coli NOT DETECTED NOT DETECTED Final   Klebsiella aerogenes NOT DETECTED NOT DETECTED Final   Klebsiella oxytoca NOT DETECTED NOT DETECTED Final   Klebsiella pneumoniae NOT DETECTED NOT DETECTED Final   Proteus species NOT DETECTED NOT DETECTED Final   Salmonella species NOT DETECTED NOT DETECTED Final   Serratia marcescens NOT DETECTED NOT DETECTED Final   Haemophilus influenzae NOT DETECTED NOT DETECTED Final   Neisseria meningitidis NOT DETECTED NOT DETECTED Final   Pseudomonas aeruginosa NOT DETECTED NOT DETECTED Final   Stenotrophomonas maltophilia NOT DETECTED NOT DETECTED Final   Candida albicans NOT DETECTED NOT DETECTED Final   Candida auris NOT DETECTED NOT DETECTED Final   Candida glabrata NOT DETECTED NOT DETECTED Final   Candida krusei NOT DETECTED NOT DETECTED Final   Candida parapsilosis NOT DETECTED NOT DETECTED Final   Candida tropicalis NOT DETECTED NOT DETECTED Final   Cryptococcus neoformans/gattii NOT DETECTED NOT DETECTED Final   Methicillin resistance mecA/C DETECTED (A) NOT DETECTED Final    Comment: CRITICAL RESULT CALLED TO, READ BACK BY AND VERIFIED WITH: PHARMD JENNY ZHOU [id number], ADC Performed at [facility], [address], [place], [place] [address]   MRSA Next Gen by PCR, Nasal     Status: None   Collection Time: 07/09/23  6:42 PM   Specimen: Nasal Mucosa; Nasal Swab  Result Value Ref Range Status   MRSA by PCR Next Gen NOT DETECTED NOT DETECTED Final    Comment: (NOTE) The GeneXpert MRSA Assay (FDA approved for NASAL specimens only), is one component of a comprehensive MRSA colonization surveillance program. It is not intended to diagnose MRSA infection nor to guide or monitor treatment for MRSA infections. Test performance is not FDA approved in patients less than 29 years old. Performed at [facility], [address] [place], [place] [address]   Culture, blood (Routine X 2) w Reflex to ID Panel     Status: None   Collection Time: 07/11/23  3:47 PM   Specimen: BLOOD RIGHT ARM  Result Value Ref Range Status   Specimen Description BLOOD RIGHT ARM  Final   Special Requests   Final    BOTTLES DRAWN AEROBIC ONLY Blood Culture results may not be optimal due to an inadequate volume of blood received in culture bottles   Culture   Final    NO GROWTH 5 DAYS Performed at [facility], [address] [place], [place] [address]    Report Status 07/16/2023 FINAL  Final  Culture, blood (Routine X 2) w Reflex to ID Panel     Status: None   Collection Time: 07/11/23  3:49 PM   Specimen: BLOOD RIGHT ARM  Result Value Ref Range Status   Specimen Description BLOOD RIGHT ARM  Final   Special Requests   Final    BOTTLES DRAWN  AEROBIC ONLY Blood Culture results may not be optimal due to an inadequate volume of blood received in culture bottles   Culture   Final    NO GROWTH 5 DAYS Performed at [facility], [address] [place], [place] [address]    Report Status 07/16/2023 FINAL  Final  Surgical pcr screen     Status: None   Collection Time: 07/12/23  4:46 AM   Specimen: Nasal Mucosa; Nasal Swab  Result Value Ref Range Status   MRSA, PCR NEGATIVE NEGATIVE Final   Staphylococcus aureus  NEGATIVE NEGATIVE Final    Comment: (NOTE) The Xpert SA Assay (FDA approved for NASAL specimens in patients 39 years of age and older), is one component of a comprehensive surveillance program. It is not intended to diagnose infection nor to guide or monitor treatment. Performed at [facility], [address] [place], [place] [address]      Labs: BNP (last 3 results) Recent Labs    10/07/22 1404 12/19/22 0638  BNP 615.2* 1,564.4*   Basic Metabolic Panel: Recent Labs  Lab 07/13/23 0527 07/15/23 0056 07/15/23 1838 07/15/23 2142 07/16/23 0001 07/16/23 0412 07/17/23 0432 07/18/23 0549  NA 133* 127* 133* 138 135 133* 130* 131*  K 3.8 3.8 3.7 3.0* 4.1 4.7 4.4 4.0  CL 96* 90* 97*  --   --  97* 93* 96*  CO2 25 23  --   --   --  21* 24 26  GLUCOSE 206* 121* 110*  --   --  171* 258* 145*  BUN 18 31* 15  --   --  14 30* 15  CREATININE 4.83* 7.23* 4.70*  --   --  4.75* 6.36* 4.29*  CALCIUM  8.5* 8.9  --   --   --  9.1 8.9 8.6*  PHOS  --  3.2  --   --   --   --   --   --    Liver Function Tests: Recent Labs  Lab 07/15/23 0056  ALBUMIN  2.9*   No results for input(s): LIPASE, AMYLASE in the last 168 hours. No results for input(s): AMMONIA in the last 168 hours. CBC: Recent Labs  Lab 07/14/23 1224 07/15/23 0056 07/15/23 1838 07/15/23 2142 07/16/23 0001 07/16/23 0412 07/17/23 0432 07/18/23 0548  WBC 6.0 7.0  --   --   --  17.0* 9.3 7.6  NEUTROABS 3.8 4.2  --   --   --  16.1* 6.8 5.2  HGB 11.0* 11.6*   < > 8.8* 10.5* 11.1* 9.2* 9.0*  HCT 34.9* 37.0*   < > 26.0* 31.0* 35.1* 28.9* 27.7*  MCV 82.5 83.3  --   --   --  84.0 83.8 83.7  PLT 313 142*  --   --   --  316 341 250   < > = values in this interval not displayed.   Cardiac Enzymes: No results for input(s): CKTOTAL, CKMB, CKMBINDEX, TROPONINI in the last 168 hours. BNP: Invalid input(s): POCBNP CBG: Recent Labs  Lab 07/17/23 0718 07/17/23 1321 07/17/23 1630 07/17/23 2139  07/18/23 0727  GLUCAP 244* 130* 118* 125* 129*   D-Dimer No results for input(s): DDIMER in the last 72 hours. Hgb A1c No results for input(s): HGBA1C in the last 72 hours. Lipid Profile No results for input(s): CHOL, HDL, LDLCALC, TRIG, CHOLHDL, LDLDIRECT in the last 72 hours. Thyroid  function studies No results for input(s): TSH, T4TOTAL, T3FREE, THYROIDAB in the last 72 hours.  Invalid  input(s): FREET3 Anemia work up No results for input(s): VITAMINB12, FOLATE, FERRITIN, TIBC, IRON, RETICCTPCT in the last 72 hours. Urinalysis    Component Value Date/Time   COLORURINE YELLOW 01/25/2010 1953   APPEARANCEUR CLOUDY (A) 01/25/2010 1953   LABSPEC 1.017 01/25/2010 1953   PHURINE 5.5 01/25/2010 1953   GLUCOSEU 100 (A) 01/25/2010 1953   HGBUR SMALL (A) 01/25/2010 1953   BILIRUBINUR NEGATIVE 01/25/2010 1953   KETONESUR NEGATIVE 01/25/2010 1953   PROTEINUR >300 (A) 01/25/2010 1953   UROBILINOGEN 0.2 01/25/2010 1953   NITRITE NEGATIVE 01/25/2010 1953   LEUKOCYTESUR NEGATIVE 01/25/2010 1953   Sepsis Labs Recent Labs  Lab 07/15/23 0056 07/16/23 0412 07/17/23 0432 07/18/23 0548  WBC 7.0 17.0* 9.3 7.6   Microbiology Recent Results (from the past 240 hours)  Culture, blood (Routine X 2) w Reflex to ID Panel     Status: Abnormal   Collection Time: 07/09/23  3:23 PM   Specimen: BLOOD  Result Value Ref Range Status   Specimen Description BLOOD SITE NOT SPECIFIED  Final   Special Requests   Final    BOTTLES DRAWN AEROBIC AND ANAEROBIC Blood Culture adequate volume   Culture  Setup Time   Final    GRAM POSITIVE COCCI IN CLUSTERS ANAEROBIC BOTTLE ONLY CRITICAL VALUE NOTED.  VALUE IS CONSISTENT WITH PREVIOUSLY REPORTED AND CALLED VALUE. Performed at [facility], [address] [place], [place] [address]    Culture STAPHYLOCOCCUS EPIDERMIDIS (A)  Final   Report Status 07/13/2023 FINAL  Final   Organism ID, Bacteria STAPHYLOCOCCUS  EPIDERMIDIS  Final      Susceptibility   Staphylococcus epidermidis - MIC*    CIPROFLOXACIN <=0.5 SENSITIVE Sensitive     ERYTHROMYCIN  >=8 RESISTANT Resistant     GENTAMICIN <=0.5 SENSITIVE Sensitive     OXACILLIN >=4 RESISTANT Resistant     TETRACYCLINE >=16 RESISTANT Resistant     VANCOMYCIN  1 SENSITIVE Sensitive     TRIMETH /SULFA  <=10 SENSITIVE Sensitive     CLINDAMYCIN <=0.25 SENSITIVE Sensitive     RIFAMPIN <=0.5 SENSITIVE Sensitive     Inducible Clindamycin NEGATIVE Sensitive     * STAPHYLOCOCCUS EPIDERMIDIS  Culture, blood (Routine X 2) w Reflex to ID Panel     Status: Abnormal   Collection Time: 07/09/23  3:39 PM   Specimen: BLOOD  Result Value Ref Range Status   Specimen Description BLOOD SITE NOT SPECIFIED  Final   Special Requests   Final    BOTTLES DRAWN AEROBIC AND ANAEROBIC Blood Culture adequate volume   Culture  Setup Time   Final    GRAM POSITIVE COCCI IN CLUSTERS IN BOTH AEROBIC AND ANAEROBIC BOTTLES CRITICAL RESULT CALLED TO, READ BACK BY AND VERIFIED WITH: PHARMD JENNY ZHOU [phone], ADC CRITICAL VALUE NOTED.  VALUE IS CONSISTENT WITH PREVIOUSLY REPORTED AND CALLED VALUE. Performed at [facility], [address] [place], [place] [address]    Culture STAPHYLOCOCCUS EPIDERMIDIS (A)  Final   Report Status 07/12/2023 FINAL  Final   Organism ID, Bacteria STAPHYLOCOCCUS EPIDERMIDIS  Final      Susceptibility   Staphylococcus epidermidis - MIC*    CIPROFLOXACIN <=0.5 SENSITIVE Sensitive     ERYTHROMYCIN  >=8 RESISTANT Resistant     GENTAMICIN <=0.5 SENSITIVE Sensitive     OXACILLIN >=4 RESISTANT Resistant     TETRACYCLINE >=16 RESISTANT Resistant     VANCOMYCIN  2 SENSITIVE Sensitive     TRIMETH /SULFA  <=10 SENSITIVE Sensitive     CLINDAMYCIN <=0.25 SENSITIVE Sensitive  RIFAMPIN <=0.5 SENSITIVE Sensitive     Inducible Clindamycin NEGATIVE Sensitive     * STAPHYLOCOCCUS EPIDERMIDIS  Blood Culture ID Panel (Reflexed)     Status: Abnormal   Collection  Time: 07/09/23  3:39 PM  Result Value Ref Range Status   Enterococcus faecalis NOT DETECTED NOT DETECTED Final   Enterococcus Faecium NOT DETECTED NOT DETECTED Final   Listeria monocytogenes NOT DETECTED NOT DETECTED Final   Staphylococcus species DETECTED (A) NOT DETECTED Final    Comment: CRITICAL RESULT CALLED TO, READ BACK BY AND VERIFIED WITH: PHARMD JENNY ZHOU [phone], ADC    Staphylococcus aureus (BCID) NOT DETECTED NOT DETECTED Final   Staphylococcus epidermidis DETECTED (A) NOT DETECTED Final    Comment: Methicillin (oxacillin) resistant coagulase negative staphylococcus. Possible blood culture contaminant (unless isolated from more than one blood culture draw or clinical case suggests pathogenicity). No antibiotic treatment is indicated for blood  culture contaminants. CRITICAL RESULT CALLED TO, READ BACK BY AND VERIFIED WITH: PHARMD JENNY ZHOU [phone], ADC    Staphylococcus lugdunensis NOT DETECTED NOT DETECTED Final   Streptococcus species NOT DETECTED NOT DETECTED Final   Streptococcus agalactiae NOT DETECTED NOT DETECTED Final   Streptococcus pneumoniae NOT DETECTED NOT DETECTED Final   Streptococcus pyogenes NOT DETECTED NOT DETECTED Final   A.calcoaceticus-baumannii NOT DETECTED NOT DETECTED Final   Bacteroides fragilis NOT DETECTED NOT DETECTED Final   Enterobacterales NOT DETECTED NOT DETECTED Final   Enterobacter cloacae complex NOT DETECTED NOT DETECTED Final   Escherichia coli NOT DETECTED NOT DETECTED Final   Klebsiella aerogenes NOT DETECTED NOT DETECTED Final   Klebsiella oxytoca NOT DETECTED NOT DETECTED Final   Klebsiella pneumoniae NOT DETECTED NOT DETECTED Final   Proteus species NOT DETECTED NOT DETECTED Final   Salmonella species NOT DETECTED NOT DETECTED Final   Serratia marcescens NOT DETECTED NOT DETECTED Final   Haemophilus influenzae NOT DETECTED NOT DETECTED Final   Neisseria meningitidis NOT DETECTED NOT DETECTED Final   Pseudomonas  aeruginosa NOT DETECTED NOT DETECTED Final   Stenotrophomonas maltophilia NOT DETECTED NOT DETECTED Final   Candida albicans NOT DETECTED NOT DETECTED Final   Candida auris NOT DETECTED NOT DETECTED Final   Candida glabrata NOT DETECTED NOT DETECTED Final   Candida krusei NOT DETECTED NOT DETECTED Final   Candida parapsilosis NOT DETECTED NOT DETECTED Final   Candida tropicalis NOT DETECTED NOT DETECTED Final   Cryptococcus neoformans/gattii NOT DETECTED NOT DETECTED Final   Methicillin resistance mecA/C DETECTED (A) NOT DETECTED Final    Comment: CRITICAL RESULT CALLED TO, READ BACK BY AND VERIFIED WITH: PHARMD JENNY ZHOU [id number], ADC Performed at [facility], [address], [place], [place] [address]   MRSA Next Gen by PCR, Nasal     Status: None   Collection Time: 07/09/23  6:42 PM   Specimen: Nasal Mucosa; Nasal Swab  Result Value Ref Range Status   MRSA by PCR Next Gen NOT DETECTED NOT DETECTED Final    Comment: (NOTE) The GeneXpert MRSA Assay (FDA approved for NASAL specimens only), is one component of a comprehensive MRSA colonization surveillance program. It is not intended to diagnose MRSA infection nor to guide or monitor treatment for MRSA infections. Test performance is not FDA approved in patients less than 19 years old. Performed at [facility], [address], [place], [place] [address]   Culture, blood (Routine X 2) w Reflex to ID Panel     Status: None   Collection Time:  07/11/23  3:47 PM   Specimen: BLOOD RIGHT ARM  Result Value Ref Range Status   Specimen Description BLOOD RIGHT ARM  Final   Special Requests   Final    BOTTLES DRAWN AEROBIC ONLY Blood Culture results may not be optimal due to an inadequate volume of blood received in culture bottles   Culture   Final    NO GROWTH 5 DAYS Performed at [facility], [address], [place], [place] [address]    Report Status 07/16/2023 FINAL  Final  Culture, blood (Routine X 2) w Reflex  to ID Panel     Status: None   Collection Time: 07/11/23  3:49 PM   Specimen: BLOOD RIGHT ARM  Result Value Ref Range Status   Specimen Description BLOOD RIGHT ARM  Final   Special Requests   Final    BOTTLES DRAWN AEROBIC ONLY Blood Culture results may not be optimal due to an inadequate volume of blood received in culture bottles   Culture   Final    NO GROWTH 5 DAYS Performed at [facility], [address], [place], [place] [address]    Report Status 07/16/2023 FINAL  Final  Surgical pcr screen     Status: None   Collection Time: 07/12/23  4:46 AM   Specimen: Nasal Mucosa; Nasal Swab  Result Value Ref Range Status   MRSA, PCR NEGATIVE NEGATIVE Final   Staphylococcus aureus NEGATIVE NEGATIVE Final    Comment: (NOTE) The Xpert SA Assay (FDA approved for NASAL specimens in patients 21 years of age and older), is one component of a comprehensive surveillance program. It is not intended to diagnose infection nor to guide or monitor treatment. Performed at [facility], [address] [place], [place] [address]     Please note: You were cared for by a hospitalist during your hospital stay. Once you are discharged, your primary care physician will handle any further medical issues. Please note that NO REFILLS for any discharge medications will be authorized once you are discharged, as it is imperative that you return to your primary care physician (or establish a relationship with a primary care physician if you do not have one) for your post hospital discharge needs so that they can reassess your need for medications and monitor your lab values.    Time coordinating discharge: 40 minutes  SIGNED:   Ivonne Mustache, MD  [facility] 07/18/2023, 11:36 AM Pager [phone]  If 7PM-7AM, please contact night-coverage [email or web address] Password [email or web address]

## 2023-07-18 NOTE — Care Management Important Message (Signed)
 Important Message  Patient Details  Name: Shawn [name] MRN: [id number] Date of Birth: May 08, 1961   Important Message Given:  Yes - Medicare IM     Dorena Bodo 07/18/2023, 3:38 PM

## 2023-07-19 ENCOUNTER — Encounter (HOSPITAL_COMMUNITY): Payer: Self-pay | Admitting: Neurosurgery

## 2023-07-19 DIAGNOSIS — M4622 Osteomyelitis of vertebra, cervical region: Secondary | ICD-10-CM

## 2023-07-19 LAB — RENAL FUNCTION PANEL
Albumin: 2.7 g/dL — ABNORMAL LOW (ref 3.5–5.0)
Anion gap: 11 (ref 5–15)
BUN: 23 mg/dL (ref 8–23)
CO2: 24 mmol/L (ref 22–32)
Calcium: 8.7 mg/dL — ABNORMAL LOW (ref 8.9–10.3)
Chloride: 96 mmol/L — ABNORMAL LOW (ref 98–111)
Creatinine, Ser: 6.23 mg/dL — ABNORMAL HIGH (ref 0.61–1.24)
GFR, Estimated: 9 mL/min — ABNORMAL LOW (ref >60)
Glucose, Bld: 160 mg/dL — ABNORMAL HIGH (ref 70–99)
Phosphorus: 3.1 mg/dL (ref 2.5–4.6)
Potassium: 3.8 mmol/L (ref 3.5–5.1)
Sodium: 131 mmol/L — ABNORMAL LOW (ref 135–145)

## 2023-07-19 LAB — HEPATITIS B SURFACE ANTIGEN: Hepatitis B Surface Ag: NONREACTIVE

## 2023-07-19 LAB — CBC
HCT: 26.4 % — ABNORMAL LOW (ref 39.0–52.0)
Hemoglobin: 8.4 g/dL — ABNORMAL LOW (ref 13.0–17.0)
MCH: 27.1 pg (ref 26.0–34.0)
MCHC: 31.8 g/dL (ref 30.0–36.0)
MCV: 85.2 fL (ref 80.0–100.0)
Platelets: 309 10*3/uL (ref 150–400)
RBC: 3.1 MIL/uL — ABNORMAL LOW (ref 4.22–5.81)
RDW: 15.9 % — ABNORMAL HIGH (ref 11.5–15.5)
WBC: 10.1 10*3/uL (ref 4.0–10.5)
nRBC: 0 % (ref 0.0–0.2)

## 2023-07-19 LAB — GLUCOSE, CAPILLARY
Glucose-Capillary: 141 mg/dL — ABNORMAL HIGH (ref 70–99)
Glucose-Capillary: 122 mg/dL — ABNORMAL HIGH (ref 70–99)

## 2023-07-19 MED ORDER — HEPARIN SODIUM (PORCINE) 1000 UNIT/ML DIALYSIS: 1000.0000 [IU] | INTRAMUSCULAR | Status: DC | PRN

## 2023-07-19 MED ORDER — CHLORHEXIDINE GLUCONATE CLOTH 2 % EX PADS
6.0000 | MEDICATED_PAD | Freq: Two times a day (BID) | CUTANEOUS | Status: DC
  Administered 2023-07-20 – 2023-08-04 (×27): 6 via TOPICAL

## 2023-07-19 MED ORDER — LIDOCAINE-PRILOCAINE 2.5-2.5 % EX CREA: 1.0000 | TOPICAL_CREAM | CUTANEOUS | Status: DC | PRN

## 2023-07-19 MED ORDER — ANTICOAGULANT SODIUM CITRATE 4% (200MG/5ML) IV SOLN: 5.0000 mL | Status: DC | PRN

## 2023-07-19 MED ORDER — LIDOCAINE HCL (PF) 1 % IJ SOLN: 5.0000 mL | INTRAMUSCULAR | Status: DC | PRN

## 2023-07-19 MED ORDER — PENTAFLUOROPROP-TETRAFLUOROETH EX AERO: 1.0000 | INHALATION_SPRAY | CUTANEOUS | Status: DC | PRN

## 2023-07-19 MED ORDER — HEPARIN SODIUM (PORCINE) 1000 UNIT/ML IJ SOLN
2900.0000 [IU] | Freq: Once | INTRAMUSCULAR | Status: AC
  Administered 2023-07-19: 2900 [IU] via INTRAVENOUS
  Filled 2023-07-19: qty 3

## 2023-07-19 MED ORDER — BISACODYL 10 MG RE SUPP: 10.0000 mg | Freq: Every day | RECTAL | Status: DC | PRN

## 2023-07-19 MED ORDER — DOCUSATE SODIUM 283 MG RE ENEM: 1.0000 | ENEMA | Freq: Every day | RECTAL | Status: DC | PRN

## 2023-07-19 MED ORDER — ALTEPLASE 2 MG IJ SOLR: 2.0000 mg | Freq: Once | INTRAMUSCULAR | Status: DC | PRN

## 2023-07-19 MED ORDER — SENNOSIDES-DOCUSATE SODIUM 8.6-50 MG PO TABS
2.0000 | ORAL_TABLET | Freq: Two times a day (BID) | ORAL | Status: DC
  Administered 2023-07-19 – 2023-07-21 (×4): 2 via ORAL
  Filled 2023-07-19 (×5): qty 2

## 2023-07-19 MED ORDER — HEPARIN SODIUM (PORCINE) 1000 UNIT/ML DIALYSIS
6500.0000 [IU] | Freq: Once | INTRAMUSCULAR | Status: AC
  Administered 2023-07-19: 6500 [IU] via INTRAVENOUS_CENTRAL
  Filled 2023-07-19: qty 7

## 2023-07-19 MED ORDER — POLYETHYLENE GLYCOL 3350 17 G PO PACK
17.0000 g | PACK | Freq: Two times a day (BID) | ORAL | Status: DC
  Administered 2023-07-19 – 2023-07-22 (×6): 17 g via ORAL
  Filled 2023-07-19 (×6): qty 1

## 2023-07-19 NOTE — Discharge Instructions (Addendum)
 Inpatient Rehab Discharge Instructions  Milen Lengacher Discharge date and time: 08/04/2023  Activities/Precautions/ Functional Status: Activity: no lifting, driving, or strenuous exercise until cleared by MD. Continue wearing Aspen neck collar and may remove for bathing and for changing collar lining only. Diet: renal diet Wound Care: keep wound clean and dry; allow Steri-strips to wear off. If they become loose and are no longer across the inicision, you may peel them off. Functional status:  ___ No restrictions     ___ Walk up steps independently _x__ 24/7 supervision/assistance   ___ Walk up steps with assistance ___ Intermittent supervision/assistance  ___ Bathe/dress independently ___ Walk with walker     ___ Bathe/dress with assistance ___ Walk Independently    ___ Shower independently ___ Walk with assistance    __x_ Shower with assistance _x__ No alcohol      ___ Return to work/school ________  Special Instructions: No driving, alcohol  consumption or tobacco use.  Continue wearing Aspen neck collar and may remove for bathing and for changing collar lining only.  Recommend checking fingerstick blood sugars daily and record. Bring this information with you to follow-up appointment with PCP.  Recommend daily BP measurement in same arm and record time of day. Bring this information with you to follow-up appointment with PCP. COMMUNITY REFERRALS UPON DISCHARGE:    Home Health:   PT     OT   SNA                Agency:[facility]  [phone]   *Please expect follow-up within 2-3 business days for discharge to schedule your home visit. If you have not received follow-up, be sure to contact the site directly.*     My questions have been answered and I understand these instructions. I will adhere to these goals and the provided educational materials after my discharge from the hospital.  Patient/Caregiver Signature _______________________________ Date  __________  Clinician Signature _______________________________________ Date __________  Please bring this form and your medication list with you to all your follow-up doctor's appointments.

## 2023-07-19 NOTE — Evaluation (Signed)
 Physical Therapy Assessment and Plan  Patient Details  Name: Shawn [name] MRN: [id number] Date of Birth: 1961-06-30  PT Diagnosis: Abnormality of gait, Coordination disorder, Difficulty walking, Impaired sensation, and Muscle weakness Rehab Potential: Good ELOS: 2-2.5 weeks   Today's Date: 07/19/2023 PT Individual Time: 0805-0902 + [id number] PT Individual Time Calculation (min): 57 min  + 55 min  Hospital Problem: Principal Problem:   Osteomyelitis of cervical spine (HCC)   Past Medical History:  Past Medical History:  Diagnosis Date   Anemia    Arthritis    patient denies   Blind right eye    Complication of anesthesia    Diabetes mellitus    type II   ESRD on hemodialysis (HCC)    Dialysis since 2011 MWF   HCAP (healthcare-associated pneumonia) 12/19/2022   Hypertension    Legally blind    No pertinent past medical history    PONV (postoperative nausea and vomiting)    N/V- became dehydrated had to have IV fluids- 01/2015   Shortness of breath dyspnea    when has too fluid   Stroke (HCC) 07/09/2010   date per patient   Syncope    Unspecified cerebral artery occlusion with cerebral infarction 04/02/2013   Past Surgical History:  Past Surgical History:  Procedure Laterality Date   ANTERIOR CERVICAL CORPECTOMY N/A 07/15/2023   Procedure: Cervical six-Cervical seven CORPECTOMY;  Surgeon: Louis Shove, MD;  Location: [facility] OR;  Service: Neurosurgery;  Laterality: N/A;   AV FISTULA PLACEMENT Right 05/12/2013   Procedure: INSERTION OF ARTERIOVENOUS (AV) GORE-TEX GRAFT ARM; ULTRASOUND GUIDED;  Surgeon: Redell LITTIE Door, MD;  Location: [facility] OR;  Service: Vascular;  Laterality: Right;   AV FISTULA PLACEMENT Left 01/18/2015   Procedure: INSERTION OF ARTERIOVENOUS GORE-TEX GRAFT LEFT UPPER ARM;  Surgeon: Redell LITTIE Door, MD;  Location: [facility] OR;  Service: Vascular;  Laterality: Left;   COLONOSCOPY  06/2012   EYE SURGERY Bilateral    retina surgery both eyes, cataract surgery both eyes    HERNIA REPAIR     right inguinal   INSERTION OF DIALYSIS CATHETER Right    IR FLUORO GUIDE CV LINE RIGHT  07/12/2023   IR PTA VENOUS EXCEPT DIALYSIS CIRCUIT  07/12/2023   left arm graft  10/2010   LIGATION ARTERIOVENOUS GORTEX GRAFT Left 05/03/2015   Procedure: LIGATION ARTERIOVENOUS GORTEX GRAFT-LEFT UPPER ARM;  Surgeon: Redell LITTIE Door, MD;  Location: [facility] OR;  Service: Vascular;  Laterality: Left;   POSTERIOR CERVICAL FUSION/FORAMINOTOMY N/A 07/15/2023   Procedure: POSTERIOR CERVICAL FUSION /Cervical Five -Thoracic one;  Surgeon: Louis Shove, MD;  Location: [facility] OR;  Service: Neurosurgery;  Laterality: N/A;    Assessment & Plan Clinical Impression: Patient is a 63 year old male who presented to the ED on 07/08/2023 complaining of persistent neck pain for 2-3 weeks. He stated pain radiated down both arms. 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 CT C-spine showed severe erosion at the C6 and C7 with severe spinal canal stenosis at the C6-7 levels concerning for discitis/osteomyelitis. MRI C-spine and T-spine shows vertebral discitis/osteomyelitis at the C6-7 level, severe spinal canal stenosis at this level. Neurosurgery consulted>>extensive destructive process ongoing at C6 and C7 with vertebral collapse of C6 and C7 vertebral bodies. There is a central protrusion at the level of the C6-7 disc space causing moderately severe  stenosis with some cord compression but no high signal abnormality. There was prevertebral phlegmon possibly consistent with osteomyelitis discitis. There was no evidence of significant epidural abscess. Started on empiric antibiotics and blood cultures positive for Staphylococcus epidermidis. ID consulted. Had attempt at catheter exchange in IR on 1/03 -unable to remove catheter d/t occluded SVC, no further  access if removed. Taken to OR on 1/06 and underwent C5-T1 anterior cervical fusion utilizing interbody cage. Remains on IV vancomycin  on HD days though 08/26/2023. Aspen collar in place. Aspirin  325 mg daily. The patient requires inpatient medicine and rehabilitation evaluations and services for ongoing dysfunction secondary to due to cervical myelopathy and osteomyelitis.   Patient currently requires min with mobility secondary to muscle weakness, decreased cardiorespiratoy endurance, impaired timing and sequencing and decreased coordination, low vision, and decreased sitting balance, decreased standing balance, decreased postural control, and decreased balance strategies.  Prior to hospitalization, patient was modified independent  with mobility and lived with Spouse in a Apartment home.  Home access is  Level entry.  Patient will benefit from skilled PT intervention to maximize safe functional mobility, minimize fall risk, and decrease caregiver burden for planned discharge home with 24 hour supervision.  Anticipate patient will benefit from follow up HH at discharge.  PT - End of Session Activity Tolerance: Tolerates 30+ min activity with multiple rests Endurance Deficit: Yes Endurance Deficit Description: rest breaks with all mobility tasks PT Assessment Rehab Potential (ACUTE/IP ONLY): Good PT Barriers to Discharge: Home environment access/layout PT Barriers to Discharge Comments: requires increased assist for mobility at this time, increased fall risk PT Patient demonstrates impairments in the following area(s): Balance;Endurance;Motor;Safety;Sensory;Pain PT Transfers Functional Problem(s): Bed Mobility;Bed to Chair;Car PT Locomotion Functional Problem(s): Ambulation;Wheelchair Mobility PT Plan PT Intensity: Minimum of 1-2 x/day ,45 to 90 minutes PT Frequency: 5 out of 7 days PT Duration Estimated Length of Stay: 2-2.5 weeks PT Treatment/Interventions: Ambulation/gait training;Discharge  planning;Functional mobility training;Psychosocial support;Therapeutic Activities;Visual/perceptual remediation/compensation;Balance/vestibular training;Neuromuscular re-education;Therapeutic Exercise;DME/adaptive equipment instruction;Pain management;UE/LE Strength taining/ROM;Community reintegration;Patient/family education;Stair training;UE/LE Coordination activities PT Transfers Anticipated Outcome(s): supervision PT Locomotion Anticipated Outcome(s): supervision ambulatory PT Recommendation Follow Up Recommendations: Home health PT Patient destination: Home Equipment Recommended: To be determined   PT Evaluation Precautions/Restrictions Precautions Precautions: Fall;Cervical Precaution Comments: pt blind; functional in own enviroment; cervical brace OOB to be aplied in sitting Required Braces or Orthoses: Cervical Brace Cervical Brace: Hard collar Restrictions Weight Bearing Restrictions Per Provider Order: No Pain Interference Pain Interference Pain Effect on Sleep: 2. Occasionally Pain Interference with Therapy Activities: 4. Almost constantly Pain Interference with Day-to-Day Activities: 4. Almost constantly Home Living/Prior Functioning Home Living Available Help at Discharge: Family;Available 24 hours/day Type of Home: Apartment Home Access: Level entry Home Layout: One level Bathroom Shower/Tub: Walk-in shower;Other (comment);Sponge bathes at baseline Bathroom Toilet: Standard Bathroom Accessibility: Yes Additional Comments: Active with [facility]; Independent in home with ADL and spouse performs IADL. Pt rides [employer] to dialysis. Spouse does not drive  Lives With: Spouse Prior Function Level of Independence: Independent with basic ADLs;Independent with homemaking with ambulation;Independent with homemaking with wheelchair;Independent with gait  Able to Take Stairs?: No Driving: No Cognition Overall Cognitive Status: Within Functional Limits for tasks  assessed Arousal/Alertness: Awake/alert Orientation Level: Oriented to person;Oriented to place;Oriented to time;Oriented to situation Sensation Sensation Light Touch: Appears Intact Hot/Cold: Appears Intact Proprioception: Appears Intact (5/5 great toe BLE, however reports increased difficulty discerning positioning of R great toe compared to L) Stereognosis: Not tested Coordination Gross Motor Movements are Fluid and  Coordinated: No Fine Motor Movements are Fluid and Coordinated: No Coordination and Movement Description: decreased smoothness and accuracy BUE/BLE secondary to generalized weakness Motor  Motor Motor: Other (comment) Motor - Skilled Clinical Observations: generalized weakness, mild coordination deficits  Trunk/Postural Assessment  Cervical Assessment Cervical Assessment: Exceptions to [facility] (cervical precautions, c-collar) Thoracic Assessment Thoracic Assessment: Exceptions to [facility] (rounded shoulders) Lumbar Assessment Lumbar Assessment: Exceptions to WFL (decreased lordosis) Postural Control Postural Control: Deficits on evaluation Trunk Control: excessive trunk flexion and anterior wt shift with sitting requires assist for finding neutral position Righting Reactions: significantly delayed Protective Responses: decreased Postural Limitations: decreased  Balance Balance Balance Assessed: Yes Static Sitting Balance Static Sitting - Balance Support: Bilateral upper extremity supported;Feet supported Static Sitting - Level of Assistance: 4: Min assist Dynamic Sitting Balance Dynamic Sitting - Balance Support: Bilateral upper extremity supported;During functional activity Dynamic Sitting - Level of Assistance: 4: Min Oncologist Standing - Balance Support: Bilateral upper extremity supported;During functional activity Static Standing - Level of Assistance: 4: Min assist Dynamic Standing Balance Dynamic Standing - Balance Support: Bilateral  upper extremity supported;During functional activity Dynamic Standing - Level of Assistance: 4: Min assist Extremity Assessment  RLE Assessment RLE Assessment: Exceptions to [facility] General Strength Comments: tested in supine RLE Strength Right Hip Flexion: 3+/5 Right Knee Flexion: 4-/5 Right Knee Extension: 4/5 Right Ankle Dorsiflexion: 4/5 Right Ankle Plantar Flexion: 4/5 LLE Assessment LLE Assessment: Exceptions to [facility] General Strength Comments: grossly 4/5 tested in supine  Care Tool Care Tool Bed Mobility Roll left and right activity   Roll left and right assist level: Minimal Assistance - Patient > 75%    Sit to lying activity   Sit to lying assist level: Minimal Assistance - Patient > 75%    Lying to sitting on side of bed activity   Lying to sitting on side of bed assist level: the ability to move from lying on the back to sitting on the side of the bed with no back support.: Minimal Assistance - Patient > 75%     Care Tool Transfers Sit to stand transfer   Sit to stand assist level: Minimal Assistance - Patient > 75%    Chair/bed transfer   Chair/bed transfer assist level: Minimal Assistance - Patient > 75%    Car transfer   Car transfer assist level: Minimal Assistance - Patient > 75% (noted with bed mobility)      Care Tool Locomotion Ambulation   Assist level: 2 helpers Assistive device: Walker-rolling Max distance: 37'  Walk 10 feet activity   Assist level: 2 helpers Assistive device: Walker-rolling   Walk 50 feet with 2 turns activity Walk 50 feet with 2 turns activity did not occur: Safety/medical concerns      Walk 150 feet activity Walk 150 feet activity did not occur: Safety/medical concerns      Walk 10 feet on uneven surfaces activity Walk 10 feet on uneven surfaces activity did not occur: Safety/medical concerns      Stairs Stair activity did not occur: Safety/medical concerns        Walk up/down 1 step activity Walk up/down 1 step or curb  (drop down) activity did not occur: Safety/medical concerns      Walk up/down 4 steps activity Walk up/down 4 steps activity did not occur: Safety/medical concerns      Walk up/down 12 steps activity Walk up/down 12 steps activity did not occur: Safety/medical concerns      Pick up small  objects from floor Pick up small object from the floor (from standing position) activity did not occur: Safety/medical concerns      Wheelchair Is the patient using a wheelchair?: Yes Type of Wheelchair: Manual   Wheelchair assist level: Dependent - Patient 0%    Wheel 50 feet with 2 turns activity   Assist Level: Dependent - Patient 0%  Wheel 150 feet activity   Assist Level: Dependent - Patient 0%    Refer to Care Plan for Long Term Goals  SHORT TERM GOAL WEEK 1 PT Short Term Goal 1 (Week 1): Pt will complete bed mobility with supervision PT Short Term Goal 2 (Week 1): Pt will complete transfers with CGA with LRAD PT Short Term Goal 3 (Week 1): Pt will ambulate 100' with RW with CGA PT Short Term Goal 4 (Week 1): Pt will tolerate OOB for 2 hours outside of therapies  Recommendations for other services: None   Skilled Therapeutic Intervention SESSION 1: Evaluation completed (see details above and below) with education on PT POC and goals and individual treatment initiated with focus on gait training and therapeutic activities for transfer training, education on optimal positioning for constipation, and tolerance to OOB. Pt reporting abdominal cramping and pain in rectum in supine, reports posterior cervical pain with unsupported sitting. Pt completes bed mobility with min assist, transfers with min assist with RW with cues for sequencing and positioning 2/2 blindness, and ambulates with RW +2 WC follow min assist with pt demonstrating mild coordination deficits noted functionally, R path deviation. Pt provided with TIS WC to promote improved positioning and tolerance to upright with skin  protection cushion. Pt returns to room and remains seated in [facility] with all needs within reach, cal light in place and chair alarm donned and activated at end of session.  SESSION 2: Pt presents in room in [facility], initially not agreeable to PT secondary to fatigue but agreeable with education on importance of mobility and exercise to relieve constipation as well as promote improved independence. Pt reporting reduction in abdominal pain this session. Session focused on therapeutic exercise for BLE strengthening and endurance as well as therapeutic activities for transfer training, bed mobility, and participation with self care tasks. Pt transported to day room dependently for time management and set up on kinetron while seated in WC. Pt completes interval training 1 min on/1 min off with mod verbal and tactile cues for maintaining momentum as pt demonstrating fatigue, completes on workload 90 cm/sec. Pt transported back to room and completes transfer back to bed with RW min asisst and max cues for sequencing and positioning. Pt completes sit to supine with mod cues for positioning and min assist for repositioning in supine. Pt noted to have incontinent brief and then requesting to use bedpan, pt completes rolling with min assist with hospital bed rails for doffing brief and placing bed pan. Pt requires time for placing bed pan, completes rolling with min assist and requires total assist for periarea hygiene and donning new brief. Pt then repositions towards HOB, pillows placed for comfort and for skin protection. Pt remains supine with all needs within reach, call light in place, bed alarm activated at end of session.   Mobility Bed Mobility Bed Mobility: Supine to Sit Supine to Sit: Minimal Assistance - Patient > 75% Transfers Transfers: Stand to Sit;Sit to Stand;Stand Pivot Transfers Sit to Stand: Minimal Assistance - Patient > 75% Stand to Sit: Minimal Assistance - Patient > 75% Stand Pivot Transfers:  Minimal Assistance -  Patient > 75% Stand Pivot Transfer Details: Verbal cues for safe use of DME/AE;Verbal cues for precautions/safety;Verbal cues for technique;Verbal cues for gait pattern;Verbal cues for sequencing Stand Pivot Transfer Details (indicate cue type and reason): verbal cues provided secondary to blindness Transfer (Assistive device): Rolling walker Locomotion  Gait Ambulation: Yes Gait Assistance: Minimal Assistance - Patient > 75% Gait Distance (Feet): 37 Feet Assistive device: Rolling walker Gait Assistance Details: Verbal cues for technique;Verbal cues for safe use of DME/AE;Verbal cues for sequencing;Verbal cues for precautions/safety;Manual facilitation for weight shifting;Tactile cues for initiation;Tactile cues for posture Gait Assistance Details: verbal cues provided with directions 2/2 blindness Gait Gait: Yes Gait Pattern: Impaired Gait Pattern: Step-to pattern;Decreased stride length;Shuffle Gait velocity: decreased Stairs / Additional Locomotion Stairs: No Wheelchair Mobility Wheelchair Mobility: No   Discharge Criteria: Patient will be discharged from PT if patient refuses treatment 3 consecutive times without medical reason, if treatment goals not met, if there is a change in medical status, if patient makes no progress towards goals or if patient is discharged from hospital.  The above assessment, treatment plan, treatment alternatives and goals were discussed and mutually agreed upon: by patient  Reche Ohara PT, DPT 07/19/2023, 12:06 PM

## 2023-07-19 NOTE — Evaluation (Signed)
 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  Rehab Potential: Rehab Potential (ACUTE ONLY): Good ELOS: 2 weeks   Today's Date: 07/19/2023 OT Individual Time: 9069-8954 OT Individual Time Calculation (min): 75 min     Hospital Problem: Principal Problem:   Osteomyelitis of cervical spine (HCC)   Past Medical History:  Past Medical History:  Diagnosis Date   Anemia    Arthritis    patient denies   Blind right eye    Complication of anesthesia    Diabetes mellitus    type II   ESRD on hemodialysis (HCC)    Dialysis since 2011 MWF   HCAP (healthcare-associated pneumonia) 12/19/2022   Hypertension    Legally blind    No pertinent past medical history    PONV (postoperative nausea and vomiting)    N/V- became dehydrated had to have IV fluids- 01/2015   Shortness of breath dyspnea    when has too fluid   Stroke (HCC) 07/09/2010   date per patient   Syncope    Unspecified cerebral artery occlusion with cerebral infarction 04/02/2013   Past Surgical History:  Past Surgical History:  Procedure Laterality Date   ANTERIOR CERVICAL CORPECTOMY N/A 07/15/2023   Procedure: Cervical six-Cervical seven CORPECTOMY;  Surgeon: Louis Shove, MD;  Location: [facility] OR;  Service: Neurosurgery;  Laterality: N/A;   AV FISTULA PLACEMENT Right 05/12/2013   Procedure: INSERTION OF ARTERIOVENOUS (AV) GORE-TEX GRAFT ARM; ULTRASOUND GUIDED;  Surgeon: Redell LITTIE Door, MD;  Location: [facility] OR;  Service: Vascular;  Laterality: Right;   AV FISTULA PLACEMENT Left 01/18/2015   Procedure: INSERTION OF ARTERIOVENOUS GORE-TEX GRAFT LEFT UPPER ARM;  Surgeon: Redell LITTIE Door, MD;  Location: [facility] OR;  Service: Vascular;  Laterality: Left;   COLONOSCOPY  06/2012   EYE  SURGERY Bilateral    retina surgery both eyes, cataract surgery both eyes   HERNIA REPAIR     right inguinal   INSERTION OF DIALYSIS CATHETER Right    IR FLUORO GUIDE CV LINE RIGHT  07/12/2023   IR PTA VENOUS EXCEPT DIALYSIS CIRCUIT  07/12/2023   left arm graft  10/2010   LIGATION ARTERIOVENOUS GORTEX GRAFT Left 05/03/2015   Procedure: LIGATION ARTERIOVENOUS GORTEX GRAFT-LEFT UPPER ARM;  Surgeon: Redell LITTIE Door, MD;  Location: [facility] OR;  Service: Vascular;  Laterality: Left;   POSTERIOR CERVICAL FUSION/FORAMINOTOMY N/A 07/15/2023   Procedure: POSTERIOR CERVICAL FUSION /Cervical Five -Thoracic one;  Surgeon: Louis Shove, MD;  Location: [facility] OR;  Service: Neurosurgery;  Laterality: N/A;    Assessment & Plan Clinical Impression:   Patient currently requires max with basic self-care skills and IADL secondary to muscle weakness, muscle joint tightness, and muscle paralysis, decreased cardiorespiratoy endurance, impaired timing and sequencing, abnormal tone, unbalanced muscle activation, ataxia, decreased coordination, and decreased motor planning, blindness, and decreased sitting balance, decreased standing balance, decreased postural control, decreased balance strategies, and difficulty maintaining precautions.  Prior to hospitalization, patient could complete BADL's with set up and amb with SPC with modified independent .  Patient will benefit from skilled intervention to decrease level of assist with basic self-care skills, increase independence with basic self-care skills, and increase level of independence with iADL prior to discharge home with care partner.  Anticipate patient will require minimal physical assistance and follow up home health.  OT - End of Session Activity Tolerance: Decreased this session Endurance Deficit: Yes Endurance Deficit Description: rest breaks with all mobility and ADL tasks OT Assessment Rehab Potential (ACUTE ONLY): Good OT Barriers to Discharge: Hemodialysis;Wound  Care OT Barriers to Discharge Comments: HD 3x per week, blindness, new surgical incision OT Patient demonstrates impairments in the following area(s): Balance;Motor;Sensory;Nutrition;Skin Integrity;Pain;Endurance OT Basic ADL's Functional Problem(s): Eating;Grooming;Bathing;Dressing;Toileting OT Advanced ADL's Functional Problem(s): Simple Meal Preparation;Laundry;Light Housekeeping OT Transfers Functional Problem(s): Toilet;Tub/Shower OT Additional Impairment(s): Fuctional Use of Upper Extremity OT Plan OT Intensity: Minimum of 1-2 x/day, 45 to 90 minutes OT Frequency: 5 out of 7 days OT Duration/Estimated Length of Stay: 2 weeks OT Treatment/Interventions: Balance/vestibular training;Community reintegration;Disease mangement/prevention;Functional electrical stimulation;Neuromuscular re-education;Patient/family education;Self Care/advanced ADL retraining;Splinting/orthotics;Therapeutic Exercise;UE/LE Coordination activities;Visual/perceptual remediation/compensation;UE/LE Strength taining/ROM;Therapeutic Activities;Wheelchair propulsion/positioning;Skin care/wound managment;Psychosocial support;Pain management;Functional mobility training;DME/adaptive equipment instruction;Discharge planning OT Self Feeding Anticipated Outcome(s): Supervision OT Basic Self-Care Anticipated Outcome(s): Supervision OT Toileting Anticipated Outcome(s): Supervision OT Bathroom Transfers Anticipated Outcome(s): Supervision OT Recommendation Recommendations for Other Services: Neuropsych consult Patient destination: Home Follow Up Recommendations: Home health OT Equipment Recommended: Wheelchair cushion (measurements);Wheelchair (measurements) Equipment Details: has all DME but may need a w/c   OT Evaluation Precautions/Restrictions  Precautions Precautions: Fall;Cervical Precaution Comments: pt blind; functional in own enviroment; cervical brace OOB to be aplied in sitting Required Braces or Orthoses:  Cervical Brace Cervical Brace: Hard collar Restrictions Weight Bearing Restrictions Per Provider Order: No  Pain Pain Assessment Pain Scale: 0-10 Pain Score: 4  Pain Type: Acute pain Pain Location: Abdomen Pain Orientation: Left;Lower Pain Descriptors / Indicators: Tender Patients Stated Pain Goal: 1 Pain Intervention(s): Distraction;Relaxation;Emotional support;Pain med given for lower pain score than stated, per patient request Home Living/Prior Functioning Home Living Family/patient expects to be discharged to:: Private residence Living Arrangements: Spouse/significant other Available Help at Discharge: Family, Available 24 hours/day Type of Home: Apartment Home Access: Level entry Home Layout: One level Bathroom Shower/Tub: Walk-in shower, Other (comment), Sponge bathes at baseline Bathroom Toilet: Standard Bathroom Accessibility: Yes Additional Comments: Active with [place] [facility]; Independent in home with ADL and spouse performs IADL. Pt rides [employer] to dialysis. Spouse does not drive  Lives With: Spouse Prior Function Level of Independence: Independent with basic ADLs, Independent with homemaking with ambulation, Independent with homemaking with wheelchair, Independent with gait  Able to Take Stairs?: No Driving: No Vision Baseline Vision/History: 2 Legally blind Ability to See in Adequate Light: 4 Severely impaired Patient Visual Report: No change from baseline Vision Assessment?: Yes Additional Comments: can see shadows on L Perception    Praxis Praxis: WFL Cognition Cognition Overall Cognitive Status: Within Functional Limits for tasks assessed Arousal/Alertness: Awake/alert Memory: Appears intact Awareness: Appears intact Problem Solving: Appears intact Safety/Judgment: Appears intact Brief Interview for Mental Status (BIMS) Repetition of Three Words (First Attempt): 3 Temporal Orientation: Year: Correct Temporal Orientation: Month: Accurate  within 5 days Temporal Orientation: Day: Correct Recall: Sock: Yes, no cue required Recall: Blue: Yes, no cue required Recall: Bed: Yes, no cue required BIMS Summary Score: 15 Sensation Sensation Light Touch: Appears Intact Hot/Cold: Appears Intact Proprioception: Appears Intact Stereognosis: Appears Intact Coordination Gross Motor Movements are Fluid and Coordinated: No Fine Motor Movements are Fluid and Coordinated: No Coordination and Movement Description: decreased smoothness and accuracy BUE/BLE secondary to generalized weakness Finger Nose Finger Test: unable R side, diminished L side 9 Hole Peg Test: unable to complete initial testing Motor  Motor Motor: Other (comment) Motor - Skilled Clinical Observations: generalized weakness, mild coordination deficits  Trunk/Postural Assessment  Cervical Assessment Cervical Assessment: Exceptions to [facility] (cervical precautions, c-collar) Thoracic Assessment Thoracic Assessment: Exceptions to [facility] (rounded shoulders) Lumbar Assessment Lumbar Assessment: Exceptions to WFL (decreased lordosis) Postural Control Postural Control: Deficits on evaluation Trunk Control: excessive trunk flexion and anterior wt shift with sitting requires assist for finding neutral position Righting Reactions: significantly delayed Protective Responses: decreased Postural Limitations: decreased  Balance Balance Balance Assessed: Yes Static Sitting Balance Static Sitting - Balance Support: Bilateral upper extremity supported;Feet supported Static Sitting - Level of Assistance: 4: Min assist Dynamic Sitting Balance Dynamic Sitting - Balance Support: Bilateral upper extremity supported;During functional activity Dynamic Sitting - Level of Assistance: 4: Min Oncologist Standing - Balance Support: Bilateral upper extremity supported;During functional activity Static Standing - Level of Assistance: 4: Min assist Dynamic  Standing Balance Dynamic Standing - Balance Support: Bilateral upper extremity supported;During functional activity Dynamic Standing - Level of Assistance: 4: Min assist Extremity/Trunk Assessment RUE Assessment RUE Assessment: Exceptions to [facility] Passive Range of Motion (PROM) Comments: 0-110 pain free Active Range of Motion (AROM) Comments: 0-45 General Strength Comments: 2+/5 prox, 3+/5 distal, grip 25 lbs LUE Assessment LUE Assessment: Exceptions to [facility] Passive Range of Motion (PROM) Comments: 0-120 sh Active Range of Motion (AROM) Comments: 0-65 General Strength Comments: 3-/5 prox, 3+/5 35 lb grip  Care Tool Care Tool Self Care Eating   Eating Assist Level: Moderate Assistance - Patient 50 - 74%    Oral Care    Oral Care Assist Level: Moderate Assistance - Patient 50 - 74%    Bathing   Body parts bathed by patient: Face;Chest;Abdomen Body parts bathed by helper: Right arm;Left arm;Front perineal area;Right lower leg;Left upper leg;Right upper leg;Buttocks;Left lower leg   Assist Level: Maximal Assistance - Patient 24 - 49%    Upper Body Dressing(including orthotics)   What is the patient wearing?: Hospital gown only   Assist Level: Maximal Assistance - Patient 25 - 49%    Lower Body Dressing (excluding footwear)   What is the patient wearing?: Incontinence brief Assist for lower body dressing: Total Assistance - Patient < 25%    Putting on/Taking off footwear   What is the patient wearing?: Non-skid slipper socks Assist for footwear: Total Assistance - Patient < 25%       Care Tool Toileting Toileting activity   Assist for toileting: Maximal Assistance - Patient 25 - 49%     Care Tool Bed Mobility Roll left and right activity   Roll left and right assist level: Minimal Assistance - Patient > 75%    Sit to lying activity   Sit to lying assist level: Minimal Assistance - Patient > 75%    Lying to sitting on side of bed activity   Lying to sitting on side of  bed assist level: the ability to move from lying on the back to sitting on the side of the bed with no back support.: Minimal Assistance - Patient > 75%     Care Tool Transfers Sit to stand transfer   Sit to stand assist level: Minimal Assistance - Patient > 75%    Chair/bed transfer   Chair/bed transfer assist level: Minimal Assistance - Patient > 75%     Toilet transfer   Assist Level: Minimal Assistance - Patient > 75%     Care Tool Cognition  Expression of Ideas and Wants Expression of Ideas and Wants: 4. Without difficulty (complex and basic) - expresses complex messages without difficulty and with  speech that is clear and easy to understand  Understanding Verbal and Non-Verbal Content Understanding Verbal and Non-Verbal Content: 4. Understands (complex and basic) - clear comprehension without cues or repetitions   Memory/Recall Ability Memory/Recall Ability : Current season;That he or she is in a hospital/hospital unit   Refer to Care Plan for Long Term Goals  SHORT TERM GOAL WEEK 1 OT Short Term Goal 1 (Week 1): Pt will self feed with set up, low vision strategies and built up utensils as needed OT Short Term Goal 2 (Week 1): Pt will stand sink side for 1 min during LB dressing or sink side activity with CGA OT Short Term Goal 3 (Week 1): Pt will don upper body clothing with min A  Recommendations for other services: Neuropsych   Skilled Therapeutic Intervention ADL ADL Equipment Provided: Feeding equipment Eating: Minimal assistance Where Assessed-Eating: Wheelchair Grooming: Minimal assistance Where Assessed-Grooming: Wheelchair Upper Body Bathing: Moderate assistance Where Assessed-Upper Body Bathing: Wheelchair Lower Body Bathing: Maximal assistance Where Assessed-Lower Body Bathing: Wheelchair Upper Body Dressing: Moderate assistance Where Assessed-Upper Body Dressing: Sitting at sink;Wheelchair Lower Body Dressing: Maximal assistance Where Assessed-Lower Body  Dressing: Standing at sink;Sitting at sink;Wheelchair Toileting: Moderate assistance Where Assessed-Toileting: Bedside Commode Toilet Transfer: Minimal assistance Toilet Transfer Method: Stand pivot Toilet Transfer Equipment: Gaffer: Not assessed Film/video Editor: Not assessed ADL Comments: mod A for self feeding and grooming due to UE fatigue and weakness in addition to pain and blindness, mod-max A UB/LB and min A x 2 for SPT Mobility  Bed Mobility Bed Mobility: Supine to Sit Supine to Sit: Minimal Assistance - Patient > 75% Transfers Sit to Stand: Minimal Assistance - Patient > 75% Stand to Sit: Minimal Assistance - Patient > 75%  OT Intervention/Treatment:  Pt seen for full initial OT evaluation and training session this am. Pt in TIS upon OT arrival. OT introduced role of therapy and purpose of session. Pt  open to all presented assessment and training this visit. Wife present as well. Pt blind, experiencing pain and MD arrived for discussion. OT assisted and assessed ADL's, mobility, vision, sensation. cognition/lang, G/FMC, strength and balance throughout session. See above for levels. Pt will benefit from skilled OT services at [facility] to maximize function and safety with recommendation to return home with mod I for BADL's and S for higher level activity with home OT services upon d/c home. Pt left at end of session in TIS with Ue's and LE's elevated and chair alarm set, tray table and nurse call bell within reach.   Discharge Criteria: Patient will be discharged from OT if patient refuses treatment 3 consecutive times without medical reason, if treatment goals not met, if there is a change in medical status, if patient makes no progress towards goals or if patient is discharged from hospital.  The above assessment, treatment plan, treatment alternatives and goals were discussed and mutually agreed upon: by patient and by family  Shawn [name] 07/19/2023, 1:03 PM

## 2023-07-19 NOTE — Discharge Summary (Signed)
Physician Discharge Summary  Patient ID: Taner Rzepka MRN: [id number] DOB/AGE: Nov 12, 1960 63 y.o.  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 End-stage renal disease on hemodialysis Hypertension Hyperlipidemia Blindness Retinopathy Diabetes mellitus type 2 History of prior CVA Neurogenic bowel  Discharged Condition: stable  Significant Diagnostic Studies: none  Labs:  Basic Metabolic Panel: Recent Labs  Lab 07/29/23 1028 08/02/23 0913  NA 128* 130*  K 4.9 4.4  CL 90* 92*  CO2 26 26  GLUCOSE 57* 144*  BUN 26* 18  CREATININE 7.66* 5.88*  CALCIUM 8.9 9.1  PHOS 3.4 3.8    CBC: Recent Labs  Lab 07/29/23 1028 08/02/23 0913  WBC 7.4 7.5  HGB 9.2* 9.3*  HCT 28.8* 29.7*  MCV 83.5 84.6  PLT 432* 444*    CBG: Recent Labs  Lab 08/02/23 2104 08/03/23 1107 08/03/23 1628 08/03/23 2049 08/04/23 0600  GLUCAP 153* 131* 141* 151* 186*    Brief HPI:   Shawn [name] is a 63 y.o. male who presented to the ED on 07/08/2023 complaining of persistent neck pain for 2-3 weeks. He stated pain radiated down both arms. 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 CT C-spine showed severe erosion at the C6 and C7 with severe spinal canal stenosis at the C6-7 levels concerning for discitis/osteomyelitis. MRI C-spine and T-spine shows vertebral discitis/osteomyelitis at the C6-7 level, severe spinal canal stenosis at this level. Neurosurgery consulted>>extensive destructive process ongoing at C6 and C7 with vertebral collapse of C6  and C7 vertebral bodies. There is a central protrusion at the level of the C6-7 disc space causing moderately severe stenosis with some cord compression but no high signal abnormality. There was prevertebral phlegmon possibly consistent with osteomyelitis discitis. There was no evidence of significant epidural abscess. Started on empiric antibiotics and blood cultures positive for Staphylococcus epidermidis. ID consulted. Had attempt at catheter exchange in IR on 1/03 -unable to remove catheter d/t occluded SVC, no further access if removed. Taken to OR on 1/06 and underwent C5-T1 anterior cervical fusion utilizing interbody cage. Remains on IV vancomycin on HD days though 08/26/2023. Aspen collar in place. Aspirin 325 mg daily. Heparin Eagle for DVT prophylaxis.   Hospital Course: Marquee Fuchs was admitted to rehab 07/18/2023 for inpatient therapies to consist of PT, ST and OT at least three hours five days a week. Past admission physiatrist, therapy team and rehab RN have worked together to provide customized collaborative inpatient rehab.  Sorbitol given for severe constipation/neurogenic bowel.  Hemodialysis continued on Monday, Wednesday, Friday schedule. Initiated bowel program on 1/13. Changed Tylenol to 1000 mg TID as needed and reduced Norco to 5 mg every 6 hours as needed on 1/14. 1/15: Patient now having daily, continent bowel movements. All bowel meds moved to PRN. 1/18, patient requesting Fosrenol reduction due to constipation, Dr. Marisue Humble ok with this, reduced to 500 mg TID. Sleep improved. Incision healing without signs of infection.  Blood pressures were monitored on TID basis and amlodipine 5 mg daily and lisinopril 20 mg twice daily continued. Amlodipine discontinued and carvedilol started 3.125 mg BID on 1/11. On 1/14; added back norvasc 5 mg dail,  added prn hydralazine 25 mg for SBP >180, DBP >110.    Diabetes has been monitored with ac/hs CBG checks and SSI was use prn for tighter BS  control.  Also continued Semglee 2 units at bedtime. Hypoglycemia on 1/20. Semglee discontinued and SSI changed to sensitive. Minimal coverage>>SSI discontinued at discharge.   Rehab course: During patient's stay in rehab weekly team conferences were held to monitor patient's progress, set goals and discuss barriers to discharge. At admission, patient required min assist with mobility and max assist with basic self-care skills and IADL.  He has had improvement in activity tolerance, balance, postural control as well as ability to compensate for deficits. He has had improvement in functional use RUE/LUE  and RLE/LLE as well as improvement in awareness. Patient has met 12 of 12 long term goals due to improved activity tolerance, improved balance, postural control, ability to compensate for deficits, functional use of  RIGHT upper, RIGHT lower, LEFT upper, and LEFT lower extremity, and improved coordination.  Patient to discharge at overall Supervision level.  Patient's care partner is independent to provide the necessary physical assistance at discharge.    Home health PT, OT and SNA arranged via [facility].   Discharge disposition: 06-Home-Health Care Svc     Diet: Renal  Special Instructions: No driving, alcohol consumption or tobacco use.  Continue Aspen collar and may remove for bathing and for changing collar only.  Continue vancomycin 750 mg with each HD session through 08/26/2023 for total of 6 weeks therapy for POD#1.   Continue outpatient HD on M,W,F schedule.  Discharge Instructions     Ambulatory referral to Physical Medicine Rehab   Complete by: As directed       Allergies as of 08/04/2023   No Known Allergies      Medication List     STOP taking these medications    b complex vitamins tablet   HYDROcodone-acetaminophen 10-325 MG tablet Commonly known as: NORCO   MIRCERA IJ   rosuvastatin 40 MG tablet Commonly known as: CRESTOR   Tresiba FlexTouch 100 UNIT/ML  FlexTouch Pen Generic drug: insulin degludec       TAKE these medications    acetaminophen 325 MG tablet Commonly known as: Tylenol Take 1-2 tablets (325-650 mg total) by mouth every 6 (six) hours as needed. What changed:  medication strength how much to take when to take this   amLODipine 5 MG tablet Commonly known as: NORVASC Take 1 tablet (5 mg total) by mouth daily.   aspirin EC 325 MG tablet Take 325 mg by mouth at bedtime.   atorvastatin 80 MG tablet Commonly known as: LIPITOR Take 1 tablet (80 mg total) by mouth daily.   bisacodyl 10 MG suppository Commonly known as: DULCOLAX Place 1 suppository (10 mg total) rectally daily as needed for moderate constipation.   brimonidine 0.2 % ophthalmic solution Commonly known as: ALPHAGAN Place 1 drop into both eyes 2 (two) times daily.   calcitRIOL 0.5 MCG capsule Commonly known as: ROCALTROL Take 1 capsule (0.5 mcg total) by mouth 3 (three) times a week. What changed: when to take this   carvedilol 3.125 MG tablet Commonly known as: COREG Take 1 tablet (3.125 mg total) by mouth 2 (two) times daily with a meal.   Darbepoetin Alfa 100 MCG/0.5ML Sosy injection Commonly known as: ARANESP Inject 0.5 mLs (100 mcg total) into the skin every Friday at 6 PM.   dorzolamide-timolol 2-0.5 % ophthalmic solution Commonly known as: COSOPT Place 1 drop  into both eyes 2 (two) times daily.   lanthanum 1000 MG chewable tablet Commonly known as: FOSRENOL Chew 0.5 tablets (500 mg total) by mouth 3 (three) times daily with meals. What changed: how much to take   latanoprost 0.005 % ophthalmic solution Commonly known as: XALATAN Place 1 drop into both eyes at bedtime.   lisinopril 20 MG tablet Commonly known as: ZESTRIL Take 20 mg by mouth 2 (two) times daily.   melatonin 5 MG Tabs Take 1 tablet (5 mg total) by mouth at bedtime.   polyethylene glycol 17 g packet Commonly known as: MIRALAX / GLYCOLAX Take 17 g by mouth  daily as needed for mild constipation. What changed:  when to take this reasons to take this   Senna-S 8.6-50 MG tablet Generic drug: senna-docusate Take 1 tablet by mouth 2 (two) times daily.   vancomycin IVPB Inject 750 mg into the vein every Monday, Wednesday, and Friday with hemodialysis. Indication:  MRSE discitis First Dose: No Last Day of Therapy:  08/26/23 Labs - Sunday/Monday:  CBC/D, BMP, and vancomycin trough. Labs - Thursday:  BMP and vancomycin trough Labs - Once weekly: ESR and CRP Method of administration:Elastomeric Method of administration may be changed at the discretion of the patient and/or caregiver's ability to self-administer the medication ordered.        Follow-up Information     Corine Shelter, MD Follow up.   Specialty: Pulmonary Disease Why: Call the office in 1 to 2 days to make arrangements for hospital follow-up appointment. Contact information: [address] [place] [address] [phone]         Elijah Birk C, DO Follow up.   Specialty: Physical Medicine and Rehabilitation Why: office will call you to arrange your appt (sent) Contact information: [address] [place] [address] [phone]         Julio Sicks, MD Follow up.   Specialty: Neurosurgery Why: The office in 1 to 2 days to make arrangements for hospital follow-up appointment. Contact information: [address] [place] [address] [phone]         Daiva Eves, Lisette Grinder, MD. Go to.   Specialty: Infectious Diseases Contact information: [address] [place] [address] [phone]                 Signed: Carlos Levering 08/05/2023, 8:51 AM

## 2023-07-19 NOTE — Progress Notes (Signed)
 Inpatient Rehabilitation  Patient information reviewed and entered into eRehab system by Burnard Mealing, OTR/L, Rehab Quality Coordinator.   Information including medical coding, functional ability and quality indicators will be reviewed and updated through discharge.

## 2023-07-19 NOTE — Progress Notes (Signed)
 Shawn [name] Progress Note   Subjective:   Patient seen in room - for HD today. Patient recently admitted to inpatient rehab. He reports doing well. He denies CP/dyspnea. Admits to abdominal discomfort secondary to constipation.   Objective Vitals:   07/18/23 1802 07/18/23 2158 07/18/23 2159 07/19/23 0606  BP:  (!) 151/41 (!) 151/41 121/69  Pulse:  89  87  Resp:  16  20  Temp:  97.8 F (36.6 C)  97.7 F (36.5 C)  TempSrc: Oral Oral    SpO2:  100%  100%  Weight:    64.3 kg  Height:       Physical Exam General: Blind, NAD. C-collar in place. Sitting up in chair.  Heart: RRR, 3/6 murmur, no R/G Lungs: CTA anteriorly  Abdomen: soft, non-tender, non-distended Extremities: no LE edema Dialysis Access: R [facility]  Additional Objective Labs: Basic Metabolic Panel: Recent Labs  Lab 07/15/23 0056 07/15/23 1838 07/16/23 0412 07/17/23 0432 07/18/23 0549  NA 127*   < > 133* 130* 131*  K 3.8   < > 4.7 4.4 4.0  CL 90*   < > 97* 93* 96*  CO2 23  --  21* 24 26  GLUCOSE 121*   < > 171* 258* 145*  BUN 31*   < > 14 30* 15  CREATININE 7.23*   < > 4.75* 6.36* 4.29*  CALCIUM  8.9  --  9.1 8.9 8.6*  PHOS 3.2  --   --   --   --    < > = values in this interval not displayed.   Liver Function Tests: Recent Labs  Lab 07/15/23 0056  ALBUMIN  2.9*   CBC: Recent Labs  Lab 07/14/23 1224 07/15/23 0056 07/15/23 1838 07/16/23 0412 07/17/23 0432 07/18/23 0548  WBC 6.0 7.0  --  17.0* 9.3 7.6  NEUTROABS 3.8 4.2  --  16.1* 6.8 5.2  HGB 11.0* 11.6*   < > 11.1* 9.2* 9.0*  HCT 34.9* 37.0*   < > 35.1* 28.9* 27.7*  MCV 82.5 83.3  --  84.0 83.8 83.7  PLT 313 142*  --  316 341 250   < > = values in this interval not displayed.    CBG: Recent Labs  Lab 07/18/23 0727 07/18/23 1136 07/18/23 1621 07/18/23 2112 07/19/23 0703  GLUCAP 129* 116* 138* 135* 141*   Medications:  heparin  sodium (porcine)     vancomycin  (VANCOCIN ) 750 mg in sodium chloride  0.9 % 250 mL IVPB       (feeding supplement) PROSource Plus  30 mL Oral BID BM   aspirin  EC  325 mg Oral QHS   atorvastatin   80 mg Oral Daily   brimonidine   1 drop Both Eyes BID   calcitRIOL   0.5 mcg Oral Q M,W,F   carvedilol   3.125 mg Oral BID WC   Chlorhexidine  Gluconate Cloth  6 each Topical Q12H   [START ON 07/24/2023] darbepoetin (ARANESP ) injection - DIALYSIS  60 mcg Subcutaneous Q Wed-1800   dorzolamide -timolol   1 drop Both Eyes BID   heparin   5,000 Units Subcutaneous Q8H   insulin  aspart  0-9 Units Subcutaneous TID WC   insulin  glargine-yfgn  2 Units Subcutaneous QHS   lanthanum   1,000 mg Oral TID WC   latanoprost   1 drop Both Eyes QHS   lisinopril   20 mg Oral BID   melatonin  5 mg Oral QHS   polyethylene glycol  17 g Oral Daily   senna-docusate  1 tablet Oral BID  Dialysis Orders MWF - [facility] 3:30hr, 400/A1.5, EDW 69.9kg, 2K/2.5Ca, TDC, heparin  6500 unit bolus + 2900 unit mid-run bolus - Mircera 120mcg IV q 2 weeks - last 12/22 - Calcitriol  0.5mcg PO q HD   Assessment/Plan: Cervical discitis/osteomyelitis: Blood Cx Staph Epi - on Vanc 750 mcg IV q HD until 08/26/23 per ID. Cervical spine stenosis/compression: Neurosurgery following, s/p fusion 1/6.  ESRD: Usual MWF schedule - HD today. HTN/volume: BP controlled, UF as tolerated today. He is below prior EDW (will lower on d/c). 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  Secondary HPTH: Ca/phos fine - continue VDRA - no binder for now. Nutrition: Alb low, continue supplements. T2DM (with retinopathy/blindness)  Signe Sick, PA-S Izetta Boehringer, PA-C 07/19/2023, 9:38 AM  [name]

## 2023-07-19 NOTE — Plan of Care (Signed)
  Problem: RH Balance Goal: LTG: Patient will maintain dynamic sitting balance (OT) Description: LTG:  Patient will maintain dynamic sitting balance with assistance during activities of daily living (OT) Flowsheets (Taken 07/19/2023 1206) LTG: Pt will maintain dynamic sitting balance during ADLs with: Supervision/Verbal cueing Goal: LTG Patient will maintain dynamic standing with ADLs (OT) Description: LTG:  Patient will maintain dynamic standing balance with assist during activities of daily living (OT)  Flowsheets (Taken 07/19/2023 1206) LTG: Pt will maintain dynamic standing balance during ADLs with: Supervision/Verbal cueing   Problem: Sit to Stand Goal: LTG:  Patient will perform sit to stand in prep for activites of daily living with assistance level (OT) Description: LTG:  Patient will perform sit to stand in prep for activites of daily living with assistance level (OT) Flowsheets (Taken 07/19/2023 1206) LTG: PT will perform sit to stand in prep for activites of daily living with assistance level: Supervision/Verbal cueing   Problem: RH Eating Goal: LTG Patient will perform eating w/assist, cues/equip (OT) Description: LTG: Patient will perform eating with assist, with/without cues using equipment (OT) Flowsheets (Taken 07/19/2023 1206) LTG: Pt will perform eating with assistance level of: Supervision/Verbal cueing   Problem: RH Grooming Goal: LTG Patient will perform grooming w/assist,cues/equip (OT) Description: LTG: Patient will perform grooming with assist, with/without cues using equipment (OT) Flowsheets (Taken 07/19/2023 1206) LTG: Pt will perform grooming with assistance level of: Supervision/Verbal cueing   Problem: RH Bathing Goal: LTG Patient will bathe all body parts with assist levels (OT) Description: LTG: Patient will bathe all body parts with assist levels (OT) Flowsheets (Taken 07/19/2023 1206) LTG: Pt will perform bathing with assistance level/cueing:  Supervision/Verbal cueing   Problem: RH Dressing Goal: LTG Patient will perform upper body dressing (OT) Description: LTG Patient will perform upper body dressing with assist, with/without cues (OT). Flowsheets (Taken 07/19/2023 1206) LTG: Pt will perform upper body dressing with assistance level of: Supervision/Verbal cueing Goal: LTG Patient will perform lower body dressing w/assist (OT) Description: LTG: Patient will perform lower body dressing with assist, with/without cues in positioning using equipment (OT) Flowsheets (Taken 07/19/2023 1206) LTG: Pt will perform lower body dressing with assistance level of: Supervision/Verbal cueing   Problem: RH Toileting Goal: LTG Patient will perform toileting task (3/3 steps) with assistance level (OT) Description: LTG: Patient will perform toileting task (3/3 steps) with assistance level (OT)  Flowsheets (Taken 07/19/2023 1206) LTG: Pt will perform toileting task (3/3 steps) with assistance level: Supervision/Verbal cueing   Problem: RH Functional Use of Upper Extremity Goal: LTG Patient will use RT/LT upper extremity as a (OT) Description: LTG: Patient will use right/left upper extremity as a stabilizer/gross assist/diminished/nondominant/dominant level with assist, with/without cues during functional activity (OT) Flowsheets (Taken 07/19/2023 1206) LTG: Use of upper extremity in functional activities:  LUE as diminished level  RUE as diminished level LTG: Pt will use upper extremity in functional activity with assistance level of: Supervision/Verbal cueing   Problem: RH Toilet Transfers Goal: LTG Patient will perform toilet transfers w/assist (OT) Description: LTG: Patient will perform toilet transfers with assist, with/without cues using equipment (OT) Flowsheets (Taken 07/19/2023 1206) LTG: Pt will perform toilet transfers with assistance level of: Supervision/Verbal cueing   Problem: RH Tub/Shower Transfers Goal: LTG Patient will perform  tub/shower transfers w/assist (OT) Description: LTG: Patient will perform tub/shower transfers with assist, with/without cues using equipment (OT) Flowsheets (Taken 07/19/2023 1206) LTG: Pt will perform tub/shower stall transfers with assistance level of: Supervision/Verbal cueing

## 2023-07-19 NOTE — Progress Notes (Signed)
 Occupational Therapy Session Note  Patient Details  Name: Shawn [name] MRN: [id number] Date of Birth: 04/06/1961  Today's Date: 07/20/2023 OT Individual Time: 9199-9154 OT Individual Time Calculation (min): 45 min    Short Term Goals: Week 1:  OT Short Term Goal 1 (Week 1): Pt will self feed with set up, low vision strategies and built up utensils as needed OT Short Term Goal 2 (Week 1): Pt will stand sink side for 1 min during LB dressing or sink side activity with CGA OT Short Term Goal 3 (Week 1): Pt will don upper body clothing with min A  Skilled Therapeutic Interventions/Progress Updates:  Pt greeted resting in bed for skilled OT session with focus on BADL retraining. [place] J donned upon OT arrival.   Pain: Pt with un-rated pain at surgical site, OT offering intermediate rest breaks and positioning suggestions throughout session to address pain/fatigue and maximize participation/safety in session.   Functional Transfers: Pt comes to EOB with Min A for guideane of BUE towards bed-rails, from slight elevated bed. Initial sit>stand from EOB with Min A, pt with posterior bias Min A to correct. Stand-pivot from EOB<>BSC with Min A + multimodal cuing for sequencing due to visual impairments.   Self Care Tasks: Pt requires Mod A for 3/3 toileting activities for threading/hiking of brief/thoroughness of pericare, in part due to time constraints. Pt requires extensive time to attempt BM, incontinent of small amount, see flowsheets for continent void.   Pt remained in care of NT, 4Ps assessed and immediate needs met. Pt continues to be appropriate for skilled OT intervention to promote further functional independence in ADLs/IADLs.   Therapy Documentation Precautions:  Precautions Precautions: Fall, Cervical Precaution Comments: pt blind; functional in own enviroment; cervical brace OOB to be aplied in sitting Required Braces or Orthoses: Cervical Brace Cervical Brace: Hard  collar Restrictions Weight Bearing Restrictions Per Provider Order: No   Therapy/Group: Individual Therapy  Nereida Habermann, OTR/L, MSOT  07/20/2023, 8:11 AM

## 2023-07-19 NOTE — Plan of Care (Signed)
  Problem: RH Balance Goal: LTG Patient will maintain dynamic standing balance (PT) Description: LTG:  Patient will maintain dynamic standing balance with assistance during mobility activities (PT) Flowsheets (Taken 07/19/2023 1259) LTG: Pt will maintain dynamic standing balance during mobility activities with:: Supervision/Verbal cueing   Problem: Sit to Stand Goal: LTG:  Patient will perform sit to stand with assistance level (PT) Description: LTG:  Patient will perform sit to stand with assistance level (PT) Flowsheets (Taken 07/19/2023 1259) LTG: PT will perform sit to stand in preparation for functional mobility with assistance level: Supervision/Verbal cueing   Problem: RH Bed Mobility Goal: LTG Patient will perform bed mobility with assist (PT) Description: LTG: Patient will perform bed mobility with assistance, with/without cues (PT). Flowsheets (Taken 07/19/2023 1259) LTG: Pt will perform bed mobility with assistance level of: Supervision/Verbal cueing   Problem: RH Bed to Chair Transfers Goal: LTG Patient will perform bed/chair transfers w/assist (PT) Description: LTG: Patient will perform bed to chair transfers with assistance (PT). Flowsheets (Taken 07/19/2023 1259) LTG: Pt will perform Bed to Chair Transfers with assistance level: Supervision/Verbal cueing   Problem: RH Car Transfers Goal: LTG Patient will perform car transfers with assist (PT) Description: LTG: Patient will perform car transfers with assistance (PT). Flowsheets (Taken 07/19/2023 1259) LTG: Pt will perform car transfers with assist:: Contact Guard/Touching assist   Problem: RH Ambulation Goal: LTG Patient will ambulate in controlled environment (PT) Description: LTG: Patient will ambulate in a controlled environment, # of feet with assistance (PT). Flowsheets (Taken 07/19/2023 1259) LTG: Pt will ambulate in controlled environ  assist needed:: Contact Guard/Touching assist LTG: Ambulation distance in  controlled environment: 150' Goal: LTG Patient will ambulate in home environment (PT) Description: LTG: Patient will ambulate in home environment, # of feet with assistance (PT). Flowsheets (Taken 07/19/2023 1259) LTG: Pt will ambulate in home environ  assist needed:: Supervision/Verbal cueing LTG: Ambulation distance in home environment: 62'

## 2023-07-19 NOTE — Progress Notes (Signed)
 Inpatient Rehabilitation Admission Medication Review by a Pharmacist  A complete drug regimen review was completed for this patient to identify any potential clinically significant medication issues.  High Risk Drug Classes Is patient taking? Indication by Medication  Antipsychotic No   Anticoagulant Yes Heparin  SQ - VTE prophylaxis  Antibiotic Yes Vancomycin  for MRSE discitis/osteomyelitis through 08/26/23  Opioid Yes PRN: Norco 5 - moderate pain Norco 10 - severe pain  Antiplatelet Yes Aspirin  325 mg -  CVA prophylaxis  Hypoglycemics/insulin  Yes Insulin  aspart, glargine - DM Type 2  Vasoactive Medication Yes Carvedilol , lisinopril  - CHF, hypertension  Chemotherapy No   Other Yes Atrovastatin - hyperlipidemia Calcitriol , lanthanum  - secondary hyperparathyroidism Darbepoetin - anemia of CKD Melatonin - sleep Senna-docusate, miralax  - laxatives  PRNs: Acetaminophen  - mild pain or fever >/= 101 F Cyclobenzaprine  - muscle spasms Bisacodyl , fleets enema - constipation Diphenhydramine  - itching Guaifenesin /dextromethorphan - cough Ondansetron  - nausea, vomiting     Type of Medication Issue Identified Description of Issue Recommendation(s)  Drug Interaction(s) (clinically significant)     Duplicate Therapy     Allergy     No Medication Administration End Date  Vancomycin  end date 08/26/23 Indicate stop time at discharge.  Incorrect Dose     Additional Drug Therapy Needed     Significant med changes from prior encounter (inform family/care partners about these prior to discharge). Amlodipine  changed to carvedilol . Rosuvastatin  changed to Atorvastatin . Communicate changes with patient/family prior to discharge.  Other  Semglee  substituted for PTA Treseiba  Darbepoetin weekly for Micera every 2 weeks as outpatient. Back to Tresiba  at discharge  Back to [facility] at discharge.     Clinically significant medication issues were identified that warrant physician  communication and completion of prescribed/recommended actions by midnight of the next day:  No  Pharmacist comments:   - Vancomycin  thru 06/24/24 per ID   Time spent performing this drug regimen review (minutes):  20  Genaro Zebedee Calin, [place] 07/19/2023 11:39 AM

## 2023-07-19 NOTE — Progress Notes (Signed)
Pt off the floor for Dialysis.

## 2023-07-19 NOTE — Progress Notes (Signed)
 Received patient in bed to unit.  Alert and oriented.  Informed consent signed and in chart.   TX duration:3.5 hours  Patient tolerated well.  Transported back to the room  Alert, without acute distress.  Hand-off given to patient's nurse.   Access used: R internal jugular HD Cath Access issues: none  Total UF removed: 2L Medication(s) given: Calcitriol , Vancomycin    07/19/23 1826  Vitals  Temp (!) 97.5 F (36.4 C)  Temp Source Oral  BP 138/70  BP Location Right Arm  BP Method Automatic  Patient Position (if appropriate) Lying  Pulse Rate 87  Pulse Rate Source Monitor  ECG Heart Rate 88  Resp 13  Oxygen Therapy  SpO2 100 %  O2 Device Room Air  During Treatment Monitoring  HD Safety Checks Performed Yes  Intra-Hemodialysis Comments Tx completed;Tolerated well  Dialysis Fluid Bolus Normal Saline  Bolus Amount (mL) 300 mL  Post Treatment  Dialyzer Clearance Lightly streaked  Liters Processed 84  Fluid Removed (mL) 2 mL  Tolerated HD Treatment Yes  Hemodialysis Catheter Right Internal jugular Double lumen Permanent (Tunneled)  Placement Date/Time: 07/12/23 1816   Placed prior to admission: No  Serial / Lot #: [id number]  Expiration Date: 12/07/27  Time Out: Correct patient;Correct procedure;Correct site  Maximum sterile barrier precautions: Hand hygiene;Cap;Mask;Sterile gown...  Site Condition No complications  Blue Lumen Status Flushed;Heparin  locked;Dead end cap in place  Red Lumen Status Flushed;Heparin  locked;Dead end cap in place  Purple Lumen Status N/A  Catheter fill solution Heparin  1000 units/ml  Catheter fill volume (Arterial) 1.9 cc  Catheter fill volume (Venous) 1.9  Dressing Type Transparent  Dressing Status Clean, Dry, Intact  Interventions Other (Comment) (deaccessed)  Drainage Description None  Dressing Change Due 07/26/23  Post treatment catheter status Capped and Clamped     Camellia Brasil LPN [facility]

## 2023-07-19 NOTE — Progress Notes (Signed)
 Patient requesting Enema for constipation. Per report was given a suppository prior to arriving to the unit. Per patient explains outside of today last BM was 07/09/2023. Endorsing LLQ pain upon palpation and when supine. After given enema was transferred to th [facility]. Did have small amount of type 1 & 2 stools but mainly type 7 stools. Throughout the evening leaking of stools. Patient does endorse the enema did aid in relieving some of the pressure experiencing. Continues to not have any flatulence at this time.

## 2023-07-19 NOTE — Progress Notes (Signed)
 PROGRESS NOTE   Subjective/Complaints:  Patient complaining of ongoing right upper quadrant and diffuse left-sided abdominal pain.  States it was slightly improved with bowel movement with enema overnight, but not completely.  Before this, had been 1 week since prior bowel movement.  Has poor p.o. intakes as a result, no nausea, vomiting.   Vital stable.  AM labs with ongoing stable hyponatremia, slight downtrend in hemoglobin, otherwise unchanged.  ROS:  + Abdominal pain-improved Denies fevers, chills, N/V, abdominal pain, constipation, diarrhea, SOB, cough, chest pain, new weakness or paraesthesias.    Objective:   No results found. Recent Labs    07/18/23 0548 07/19/23 1439  WBC 7.6 10.1  HGB 9.0* 8.4*  HCT 27.7* 26.4*  PLT 250 309   Recent Labs    07/18/23 0549 07/19/23 1439  NA 131* 131*  K 4.0 3.8  CL 96* 96*  CO2 26 24  GLUCOSE 145* 160*  BUN 15 23  CREATININE 4.29* 6.23*  CALCIUM  8.6* 8.7*    Intake/Output Summary (Last 24 hours) at 07/19/2023 2155 Last data filed at 07/19/2023 1826 Gross per 24 hour  Intake 340 ml  Output 2 ml  Net 338 ml     Pressure Injury 07/18/23 Sacrum Mid Stage 1 -  Intact skin with non-blanchable redness of a localized area usually over a bony prominence. (Active)  07/18/23 1630 (Assessed by Ulla, LPN and Becky, RN)  Location: Sacrum  Location Orientation: Mid  Staging: Stage 1 -  Intact skin with non-blanchable redness of a localized area usually over a bony prominence.  Wound Description (Comments):   Present on Admission: Yes    Physical Exam: Vital Signs Blood pressure (!) 143/67, pulse 89, temperature (!) 97.5 F (36.4 C), temperature source Oral, resp. rate 20, height 6' (1.829 m), weight 66.5 kg, SpO2 98%. Constitutional: No apparent distress. Appropriate appearance for age.  Sitting in wheelchair. HENT: Atraumatic, normocephalic. + Cervical collar Eyes:  Bilateral blindness; sees some shadows/blurry figures but cannot track Cardiovascular: RRR, no murmurs/rub/gallops. No Edema. Peripheral pulses 2+  Respiratory: Central coarse breath sounds, with small amount of thick secretions in suction tubing. No rales, rhonchi, or wheezing. On RA.  Abdomen: + bowel sounds, normoactive.  Mild tenderness in right lower quadrant and left upper and lower quadrant to palpation.  No distention. Skin: C/D/I. No apparent lesions.  Peripheral IV intact.  ACDF site covered in Mepilex, clean. MSK:      No apparent deformity.  Difficulty with bilateral shoulder abduction due to pain.      Strength: Antigravity and against resistance equally in all 4 extremities, 3 out of 5 bilateral shoulder abduction, otherwise 4 out of 5 throughout.  Neurologic exam:  Cognition: AAO to person, place, time and event.  Language: Fluent, No substitutions or neoglisms. No dysarthria.  Insight: Good insight into current condition.  Mood: Pleasant affect, appropriate mood.  Sensation: Bilateral stocking pattern sensory neuropathy Reflexes: 1+ in BL UE and LEs.  1-2 beats of clonus bilateral lower extremities.  No Babinski, Hoffman's. CN: 2-12 grossly intact.  Spasticity: MAS 0 in all extremities.         Assessment/Plan: 1. Functional deficits which require 3+  hours per day of interdisciplinary therapy in a comprehensive inpatient rehab setting. Physiatrist is providing close team supervision and 24 hour management of active medical problems listed below. Physiatrist and rehab team continue to assess barriers to discharge/monitor patient progress toward functional and medical goals  Care Tool:  Bathing    Body parts bathed by patient: Face, Chest, Abdomen   Body parts bathed by helper: Right arm, Left arm, Front perineal area, Right lower leg, Left upper leg, Right upper leg, Buttocks, Left lower leg     Bathing assist Assist Level: Maximal Assistance - Patient 24 - 49%      Upper Body Dressing/Undressing Upper body dressing   What is the patient wearing?: Hospital gown only    Upper body assist Assist Level: Maximal Assistance - Patient 25 - 49%    Lower Body Dressing/Undressing Lower body dressing      What is the patient wearing?: Incontinence brief     Lower body assist Assist for lower body dressing: Total Assistance - Patient < 25%     Toileting Toileting    Toileting assist Assist for toileting: Maximal Assistance - Patient 25 - 49%     Transfers Chair/bed transfer  Transfers assist     Chair/bed transfer assist level: Minimal Assistance - Patient > 75%     Locomotion Ambulation   Ambulation assist      Assist level: 2 helpers Assistive device: Walker-rolling Max distance: 37'   Walk 10 feet activity   Assist     Assist level: 2 helpers Assistive device: Walker-rolling   Walk 50 feet activity   Assist Walk 50 feet with 2 turns activity did not occur: Safety/medical concerns         Walk 150 feet activity   Assist Walk 150 feet activity did not occur: Safety/medical concerns         Walk 10 feet on uneven surface  activity   Assist Walk 10 feet on uneven surfaces activity did not occur: Safety/medical concerns         Wheelchair     Assist Is the patient using a wheelchair?: Yes Type of Wheelchair: Manual    Wheelchair assist level: Dependent - Patient 0%      Wheelchair 50 feet with 2 turns activity    Assist        Assist Level: Dependent - Patient 0%   Wheelchair 150 feet activity     Assist      Assist Level: Dependent - Patient 0%  Plan: Blood pressure (!) 143/67, pulse 89, temperature (!) 97.5 F (36.4 C), temperature source Oral, resp. rate 20, height 6' (1.829 m), weight 66.5 kg, SpO2 98%. Medical Problem List and Plan: 1. 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  Antithrombotics: -DVT/anticoagulation:  Pharmaceutical: Heparin              -antiplatelet therapy: continue aspirin  325 mg daily   3. Pain Management:  -Tylenol  as needed -Norco as needed 5-10 mg q4 hours- might need Oxy if not enough- was taking IV Dilaudid  solely on Acute.              -  Flexeril  10 mg TID as needed   -Pain well-controlled on current regimen, continue.  4. Mood/Behavior/Sleep: LCSW to evaluate and provide emotional support             -continue melatonin 5 mg q HS             -antipsychotic agents: n/a   - Sleeping well   5. Neuropsych/cognition: This patient is capable of making decisions on his own behalf.   6. Skin/Wound Care: Routine skin care checks             -monitor surgical incisions   7. Fluids/Electrolytes/Nutrition: Strict Is and Os and follow-up chemistries per nephrology    - Admission labs with stable hyponatremia.  Follow q. weekly.  8: Hypertension: monitor TID and prn             -continue amlodipine  5 mg daily             -continue lisinopril  20 mg BID    - 1-10: Mild blood pressure elevations today.  Monitor for trend, continue increase in amlodipine .    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 , Cosopt , Xalantan   11: ESRD: HD>>M,W,F via right internal jugular TDC             -continue calcitrol 0.5 mcg M,W,F             -continue Aranesp  60 mcg every Wednesday             -continue Fosrenol  1,000 mg TID with meals   12: DM-2: A1c = 5.8%; at home on Tresiba  Flextouch 12u at bedtime             -continue SSI             -continue Semglee  2 units q HS    - Well-controlled on current  regimen Recent Labs    07/18/23 2112 07/19/23 0703 07/19/23 1135  GLUCAP 135* 141* 122*     13: Constipation: continue MiraLax  daily, Senokot-S BID; prns ordered    - 1-10: Positive results with enema overnight.  Increase Mira ask to 17 g twice daily and Senokot 2S to 2 tabs twice daily.  Add docusate suppository for moderate constipation and enema for severe constipation.  No signs concerning for obstruction.  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 Louis   16: History of prior CVA: on aspirin  and statin   17. Neurogenic bowel- LBM 9 days ago- will order Sorbitol to get him to go.  If doesn't work, will need soap suds enema,  etc- might need KUB -See #13; had positive bowel movement with enema overnight, increasing bowel regimen and may need daily bowel program if no results in next 1 to 2 days  LOS: 1 days A FACE TO FACE EVALUATION WAS PERFORMED  Shawn [name] 07/19/2023, 9:55 PM

## 2023-07-19 NOTE — Progress Notes (Addendum)
 Patient ID: Shawn [name], male   DOB: 04-Jun-1961, 63 y.o.   MRN: [id number]  1203- SW spoke with pt wife to introduce self, explain role, and discuss discharge process. She reports it is only the two of them at this time,and no other supports. States if he needs help with personal care she can complete. She asked about therapies at discharge and his level of care. SW shared unsure at this time due to it being the first day of evaluations, and no discharge date yet. SW informed will provide updates as available.   *SW made efforts to meet with pt but pt going to dialysis. SW will follow-up to complete assessment.   Shawn [name], MSW, LCSW Office: [phone] Cell: [phone] Fax: [phone]

## 2023-07-20 DIAGNOSIS — K5901 Slow transit constipation: Secondary | ICD-10-CM

## 2023-07-20 DIAGNOSIS — I10 Essential (primary) hypertension: Secondary | ICD-10-CM

## 2023-07-20 LAB — GLUCOSE, CAPILLARY
Glucose-Capillary: 187 mg/dL — ABNORMAL HIGH (ref 70–99)
Glucose-Capillary: 130 mg/dL — ABNORMAL HIGH (ref 70–99)
Glucose-Capillary: 156 mg/dL — ABNORMAL HIGH (ref 70–99)

## 2023-07-20 NOTE — Progress Notes (Signed)
 Physical Therapy Session Note  Patient Details  Name: Shawn [name] MRN: [id number] Date of Birth: 04-Mar-1961  Today's Date: 07/20/2023 PT Individual Time: 8957-8878 PT Individual Time Calculation (min): 39 min   Short Term Goals: Week 1:  PT Short Term Goal 1 (Week 1): Pt will complete bed mobility with supervision PT Short Term Goal 2 (Week 1): Pt will complete transfers with CGA with LRAD PT Short Term Goal 3 (Week 1): Pt will ambulate 100' with RW with CGA PT Short Term Goal 4 (Week 1): Pt will tolerate OOB for 2 hours outside of therapies  Skilled Therapeutic Interventions/Progress Updates:    Pt presents in room in bed, agreeable to PT. Pt reporting abdominal pain however demonstrating decreased fatigue this AM. Session focused on therapeutic activities for tolerance to upright and gait training.  Pt completes bed mobility with min assist for trunk to upright. Pt requires max assist for donning scrub top min assist with sitting balance with pt demonstrating posterior LOB able to self correct with BUE assist on mattress. Therapist then threads pants with pt in sitting, max assist, stands with RW with min assist to manage over hips. Pt then reports feeling as though he has had an incontinent BM, pt able to maintain standing with CGA for postural stability ~2 min for therapist to provide total assist for periarea hygiene, mod assist pants management and brief change with pt able to maintain balance with unilateral UE support. Pt then completes stand step transfer with max verbal cues min assist to WC.  Pt transported to day room dependently for time management. Pt ambulates 100' with RW min assist with pt demonstrating increased postural sway anterior/posterior and laterally however no LOB and pt able to self correct with min assist, 2nd person WC follow for fatigue. Pt requires max cues for obstacle negotiation 2/2 low vision. Pt requires significantly increased time to complete due to  decreased cadence and step length bilaterally.  Pt then completes forward/backward walking 3x6' with cues for increasing step length forward and RW mgmt with backwards stepping, completed for improved multidirectional stepping stability with pt requiring CGA/min assist with task.  RN requesting to give pt enema 2/2 ongoing constipation. Pt returns to room and remains seated in [facility] with all needs within reach, cal light in place at end of session. BSC set up for transfer with nursing staff.   Therapy Documentation Precautions:  Precautions Precautions: Fall, Cervical Precaution Comments: pt blind; functional in own enviroment; cervical brace OOB to be aplied in sitting Required Braces or Orthoses: Cervical Brace Cervical Brace: Hard collar Restrictions Weight Bearing Restrictions Per Provider Order: No  Therapy/Group: Individual Therapy  Reche Ohara PT, DPT 07/20/2023, 11:22 AM

## 2023-07-20 NOTE — Progress Notes (Signed)
 PROGRESS NOTE   Subjective/Complaints:  Pt states he's doing ok but feels constipated. Thinks he isn't totally cleaned out from his stool burden, agreeable with another enema. Slept so-so due to hip discomfort and trouble finding a comfortable position. But pain is overall manageable. He had a BM this morning and yesterday, but states they've all been small-- looks like last good one was 1/9, although pt states he doesn't feel like he's had a good one since 12/31... Anuric at baseline.  Denies any other complaints or concerns today.   ROS:  + Abdominal pain-improving Denies fevers, chills, N/V, abdominal pain, diarrhea, SOB, cough, chest pain, new weakness or paraesthesias.    Objective:   No results found. Recent Labs    07/18/23 0548 07/19/23 1439  WBC 7.6 10.1  HGB 9.0* 8.4*  HCT 27.7* 26.4*  PLT 250 309   Recent Labs    07/18/23 0549 07/19/23 1439  NA 131* 131*  K 4.0 3.8  CL 96* 96*  CO2 26 24  GLUCOSE 145* 160*  BUN 15 23  CREATININE 4.29* 6.23*  CALCIUM  8.6* 8.7*    Intake/Output Summary (Last 24 hours) at 07/20/2023 1115 Last data filed at 07/20/2023 0300 Gross per 24 hour  Intake 480 ml  Output 2 ml  Net 478 ml     Pressure Injury 07/18/23 Sacrum Mid Stage 1 -  Intact skin with non-blanchable redness of a localized area usually over a bony prominence. (Active)  07/18/23 1630 (Assessed by Ulla, LPN and Becky, RN)  Location: Sacrum  Location Orientation: Mid  Staging: Stage 1 -  Intact skin with non-blanchable redness of a localized area usually over a bony prominence.  Wound Description (Comments):   Present on Admission: Yes    Physical Exam: Vital Signs Blood pressure (!) 142/70, pulse 83, temperature 98.5 F (36.9 C), temperature source Oral, resp. rate 17, height 6' (1.829 m), weight 66.5 kg, SpO2 100%.  Constitutional: No apparent distress. Appropriate appearance for age.  Laying in bed.   HENT: Atraumatic, normocephalic. + Cervical collar Eyes: Bilateral blindness; sees some shadows/blurry figures but cannot track Cardiovascular: RRR, no murmurs/rub/gallops. No Edema. Peripheral pulses 2+  Respiratory: CTAB this morning, no coughing or coarseness. No rales, rhonchi, or wheezing. On RA.  Abdomen: + bowel sounds but hypoactive this morning.  Mild tenderness in LLQ, slight stool burden palpable.  No distention. Soft.  Skin: C/D/I. No apparent lesions.  Peripheral IV intact.  ACDF site covered in Mepilex, clean.  PRIOR EXAMS: MSK:      No apparent deformity.  Difficulty with bilateral shoulder abduction due to pain.      Strength: Antigravity and against resistance equally in all 4 extremities, 3 out of 5 bilateral shoulder abduction, otherwise 4 out of 5 throughout.  Neurologic exam:  Cognition: AAO to person, place, time and event.  Language: Fluent, No substitutions or neoglisms. No dysarthria.  Insight: Good insight into current condition.  Mood: Pleasant affect, appropriate mood.  Sensation: Bilateral stocking pattern sensory neuropathy Reflexes: 1+ in BL UE and LEs.  1-2 beats of clonus bilateral lower extremities.  No Babinski, Hoffman's. CN: 2-12 grossly intact.  Spasticity: MAS 0 in all  extremities.         Assessment/Plan: 1. Functional deficits which require 3+ hours per day of interdisciplinary therapy in a comprehensive inpatient rehab setting. Physiatrist is providing close team supervision and 24 hour management of active medical problems listed below. Physiatrist and rehab team continue to assess barriers to discharge/monitor patient progress toward functional and medical goals  Care Tool:  Bathing    Body parts bathed by patient: Face, Chest, Abdomen   Body parts bathed by helper: Right arm, Left arm, Front perineal area, Right lower leg, Left upper leg, Right upper leg, Buttocks, Left lower leg     Bathing assist Assist Level: Maximal Assistance  - Patient 24 - 49%     Upper Body Dressing/Undressing Upper body dressing   What is the patient wearing?: Hospital gown only    Upper body assist Assist Level: Maximal Assistance - Patient 25 - 49%    Lower Body Dressing/Undressing Lower body dressing      What is the patient wearing?: Incontinence brief     Lower body assist Assist for lower body dressing: Total Assistance - Patient < 25%     Toileting Toileting    Toileting assist Assist for toileting: Maximal Assistance - Patient 25 - 49%     Transfers Chair/bed transfer  Transfers assist     Chair/bed transfer assist level: Minimal Assistance - Patient > 75%     Locomotion Ambulation   Ambulation assist      Assist level: 2 helpers Assistive device: Walker-rolling Max distance: 37'   Walk 10 feet activity   Assist     Assist level: 2 helpers Assistive device: Walker-rolling   Walk 50 feet activity   Assist Walk 50 feet with 2 turns activity did not occur: Safety/medical concerns         Walk 150 feet activity   Assist Walk 150 feet activity did not occur: Safety/medical concerns         Walk 10 feet on uneven surface  activity   Assist Walk 10 feet on uneven surfaces activity did not occur: Safety/medical concerns         Wheelchair     Assist Is the patient using a wheelchair?: Yes Type of Wheelchair: Manual    Wheelchair assist level: Dependent - Patient 0%      Wheelchair 50 feet with 2 turns activity    Assist        Assist Level: Dependent - Patient 0%   Wheelchair 150 feet activity     Assist      Assist Level: Dependent - Patient 0%  Plan: Blood pressure (!) 142/70, pulse 83, temperature 98.5 F (36.9 C), temperature source Oral, resp. rate 17, height 6' (1.829 m), weight 66.5 kg, SpO2 100%. Medical Problem List and Plan: 1. 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  Antithrombotics: -DVT/anticoagulation:  Pharmaceutical: Heparin  5000U q8h             -antiplatelet therapy: continue aspirin  325 mg daily   3. Pain Management:  -Tylenol  as needed -Norco as needed 5-10 mg q4 hours- might need Oxy if not enough- was  taking IV Dilaudid  solely on Acute.              -Flexeril  10 mg TID as needed   -Pain well-controlled on current regimen, continue.  4. Mood/Behavior/Sleep: LCSW to evaluate and provide emotional support             -continue melatonin 5 mg q HS             -antipsychotic agents: n/a   - Sleeping well   5. Neuropsych/cognition: This patient is capable of making decisions on his own behalf.   6. Skin/Wound Care: Routine skin care checks             -monitor surgical incisions   7. Fluids/Electrolytes/Nutrition: Strict Is and Os and follow-up chemistries per nephrology    - Admission labs with stable hyponatremia.  Follow q. weekly.  8: Hypertension: monitor TID and prn -continue amlodipine  5 mg daily>> appears it was d/c'd on admission? Changed to carvedilol  3.125mg  BID             -continue lisinopril  20 mg BID  - 1-10: Mild blood pressure elevations today.  Monitor for trend, continue increase in amlodipine . 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  Alphagan , Cosopt , Xalantan gtts   11: ESRD: HD>>M,W,F via right internal jugular TDC             -continue calcitrol 0.5 mcg M,W,F             -continue Aranesp  60 mcg every Wednesday             -continue Fosrenol  1,000 mg TID with meals   12: DM-2: A1c = 5.8%; at home on Tresiba  Flextouch 12u at bedtime             -continue SSI             -continue Semglee  2 units q HS    - 07/20/23 Well-controlled on current regimen Recent Labs    07/18/23 2112 07/19/23 0703 07/19/23 1135  GLUCAP 135* 141* 122*     13: Constipation: continue MiraLax  daily, Senokot-S BID; prns ordered  - 1-10: Positive results with enema overnight.  Increase Mira ask to 17 g twice daily and Senokot 2S to 2 tabs twice daily.  Add docusate suppository for moderate constipation and enema for severe constipation.  No signs concerning for obstruction. -07/20/23 only small BMs recently, wants to try another soap suds enema, will do that to see if we decrease stool burden.   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 Louis   16: History of prior CVA: on aspirin  and statin   17. Neurogenic bowel- LBM 9 days ago- will order Sorbitol to get him to go.  If doesn't work, will need soap suds enema, etc- might need KUB -See #13; had positive bowel movement with enema overnight, increasing bowel regimen and may need daily bowel program if no results in next 1 to 2 days   LOS: 2 days A FACE TO FACE EVALUATION WAS  PERFORMED  [address] 07/20/2023, 11:15 AM

## 2023-07-20 NOTE — Progress Notes (Signed)
 Occupational Therapy Session Note  Patient Details  Name: Shawn [name] MRN: [id number] Date of Birth: Sep 12, 1960  Today's Date: 07/20/2023 OT Individual Time:  -       Short Term Goals: Week 1:  OT Short Term Goal 1 (Week 1): Pt will self feed with set up, low vision strategies and built up utensils as needed OT Short Term Goal 2 (Week 1): Pt will stand sink side for 1 min during LB dressing or sink side activity with CGA OT Short Term Goal 3 (Week 1): Pt will don upper body clothing with min A  Skilled Therapeutic Interventions/Progress Updates:    Patient scheduled for 60 min of therapy at 2:00pm this afternoon.  Patient sleeping soundly and unable to awaken to his name called, gentle touch, or shaking.  Spoke with nursing staff that reported patient exhausted after resolving constipation issue.    Attempted second time at 3:30pm - patient continues to sleep soundly.  Not arousable.    Therapy Documentation Precautions:  Precautions Precautions: Fall, Cervical Precaution Comments: pt blind; functional in own enviroment; cervical brace OOB to be aplied in sitting Required Braces or Orthoses: Cervical Brace Cervical Brace: Hard collar Restrictions Weight Bearing Restrictions Per Provider Order: No General: General OT Amount of Missed Time: 60 Minutes  Pain:  Unable to ascertain    Therapy/Group: Individual Therapy  Andora Krull M 07/20/2023, 3:50 PM

## 2023-07-20 NOTE — Progress Notes (Signed)
 [facility] Progress Note   Subjective:   Patient seen and examined at bedside.  Reports ongoing abdominal cramping, feeling constipated but issue with stool incontinence during the night.  No other complaints.  Denies CP, SOB, abdominal pain and n/v/d.   Objective Vitals:   07/19/23 1949 07/19/23 1951 07/19/23 2008 07/20/23 0443  BP: (!) 132/23 (!) 143/67 (!) 143/67 (!) 142/70  Pulse: 72 89  83  Resp: 20   17  Temp:    98.5 F (36.9 C)  TempSrc:    Oral  SpO2: 98%   100%  Weight:      Height:       Physical Exam General:chronically ill appearing, blind male in NAD Heart:RRR, 3/6 systolic murmur  Lungs:CTAB, nml WOB on RA Abdomen:soft, NTND Extremities:no LE edema Dialysis Access: [facility]   Filed Weights   07/19/23 0606 07/19/23 1426 07/19/23 1845  Weight: 64.3 kg 68.5 kg 66.5 kg    Intake/Output Summary (Last 24 hours) at 07/20/2023 1321 Last data filed at 07/20/2023 1302 Gross per 24 hour  Intake 720 ml  Output 2 ml  Net 718 ml    Additional Objective Labs: Basic Metabolic Panel: Recent Labs  Lab 07/15/23 0056 07/15/23 1838 07/17/23 0432 07/18/23 0549 07/19/23 1439  NA 127*   < > 130* 131* 131*  K 3.8   < > 4.4 4.0 3.8  CL 90*   < > 93* 96* 96*  CO2 23   < > 24 26 24   GLUCOSE 121*   < > 258* 145* 160*  BUN 31*   < > 30* 15 23  CREATININE 7.23*   < > 6.36* 4.29* 6.23*  CALCIUM  8.9   < > 8.9 8.6* 8.7*  PHOS 3.2  --   --   --  3.1   < > = values in this interval not displayed.   Liver Function Tests: Recent Labs  Lab 07/15/23 0056 07/19/23 1439  ALBUMIN  2.9* 2.7*   CBC: Recent Labs  Lab 07/15/23 0056 07/15/23 1838 07/16/23 0412 07/17/23 0432 07/18/23 0548 07/19/23 1439  WBC 7.0  --  17.0* 9.3 7.6 10.1  NEUTROABS 4.2  --  16.1* 6.8 5.2  --   HGB 11.6*   < > 11.1* 9.2* 9.0* 8.4*  HCT 37.0*   < > 35.1* 28.9* 27.7* 26.4*  MCV 83.3  --  84.0 83.8 83.7 85.2  PLT 142*  --  316 341 250 309   < > = values in this interval not  displayed.   Blood Culture    Component Value Date/Time   SDES BLOOD RIGHT ARM 07/11/2023 1549   SPECREQUEST  07/11/2023 1549    BOTTLES DRAWN AEROBIC ONLY Blood Culture results may not be optimal due to an inadequate volume of blood received in culture bottles   CULT  07/11/2023 1549    NO GROWTH 5 DAYS Performed at [facility], [address], [place], [place] [address]    REPTSTATUS 07/16/2023 FINAL 07/11/2023 1549    CBG: Recent Labs  Lab 07/18/23 1621 07/18/23 2112 07/19/23 0703 07/19/23 1135 07/20/23 1206  GLUCAP 138* 135* 141* 122* 187*     Medications:  heparin  sodium (porcine)     vancomycin  (VANCOCIN ) 750 mg in sodium chloride  0.9 % 250 mL IVPB Stopped (07/19/23 1853)    (feeding supplement) PROSource Plus  30 mL Oral BID BM   aspirin  EC  325 mg Oral QHS   atorvastatin   80 mg Oral Daily  brimonidine   1 drop Both Eyes BID   calcitRIOL   0.5 mcg Oral Q M,W,F   carvedilol   3.125 mg Oral BID WC   Chlorhexidine  Gluconate Cloth  6 each Topical Q12H   [START ON 07/24/2023] darbepoetin (ARANESP ) injection - DIALYSIS  60 mcg Subcutaneous Q Wed-1800   dorzolamide -timolol   1 drop Both Eyes BID   heparin   5,000 Units Subcutaneous Q8H   insulin  aspart  0-9 Units Subcutaneous TID WC   insulin  glargine-yfgn  2 Units Subcutaneous QHS   lanthanum   1,000 mg Oral TID WC   latanoprost   1 drop Both Eyes QHS   lisinopril   20 mg Oral BID   melatonin  5 mg Oral QHS   polyethylene glycol  17 g Oral BID   senna-docusate  2 tablet Oral BID    Dialysis Orders: MWF - [facility] 3:30hr, 400/A1.5, EDW 69.9kg, 2K/2.5Ca, TDC, heparin  6500 unit bolus + 2900 unit mid-run bolus - Mircera 120mcg IV q 2 weeks - last 12/22 - Calcitriol  0.5mcg PO q HD   Assessment/Plan: Cervical discitis/osteomyelitis: Blood Cx Staph Epi - on Vanc 750 mcg IV q HD until 08/26/23 per ID. Cervical spine stenosis/compression: Neurosurgery following, s/p fusion 1/6.  ESRD: Usual MWF schedule - Next HD  07/21/22. HTN/volume: BP controlled, UF as tolerated. +weight loss, will need lower dry on discharge.  Standing weight 64.7kg.  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  Secondary HPTH: Ca/phos ok - continue VDRA - no binder for now. Nutrition: Alb low, continue supplements. T2DM (with retinopathy/blindness)  Manuelita Labella, PA-C [facility] 07/20/2023,1:21 PM  LOS: 2 days

## 2023-07-21 LAB — HEPATITIS B SURFACE ANTIBODY, QUANTITATIVE: Hep B S AB Quant (Post): 48.1 m[IU]/mL

## 2023-07-21 LAB — GLUCOSE, CAPILLARY
Glucose-Capillary: 106 mg/dL — ABNORMAL HIGH (ref 70–99)
Glucose-Capillary: 137 mg/dL — ABNORMAL HIGH (ref 70–99)
Glucose-Capillary: 152 mg/dL — ABNORMAL HIGH (ref 70–99)

## 2023-07-21 MED ORDER — CHLORHEXIDINE GLUCONATE CLOTH 2 % EX PADS: 6.0000 | MEDICATED_PAD | Freq: Every day | CUTANEOUS | Status: DC

## 2023-07-21 NOTE — Progress Notes (Signed)
 [facility] Progress Note   Subjective:   Patient seen and examined at bedside.  Feeling much better today.  Reports large BM yesterday which resolved his abdominal pain.  Denies CP, SOB and n/v/d.   Objective Vitals:   07/20/23 0443 07/20/23 1441 07/20/23 2124 07/21/23 0510  BP: (!) 142/70 129/68 (!) 146/65 (!) 150/65  Pulse: 83 80 87 84  Resp: 17 15 18 18   Temp: 98.5 F (36.9 C) 98.2 F (36.8 C) 98.2 F (36.8 C) 98.2 F (36.8 C)  TempSrc: Oral Oral Oral Oral  SpO2: 100% 100% 100% 100%  Weight:      Height:       Physical Exam General:chronically ill appearing male in NAD Heart:RRR, +3/6 systolic murmur  Lungs:CTAB, nml WOB on RA  Abdomen:soft, NTND Extremities:no LE edema Dialysis Access: [facility]   [facility] Weights   07/19/23 0606 07/19/23 1426 07/19/23 1845  Weight: 64.3 kg 68.5 kg 66.5 kg    Intake/Output Summary (Last 24 hours) at 07/21/2023 1242 Last data filed at 07/21/2023 1050 Gross per 24 hour  Intake 1025.59 ml  Output --  Net 1025.59 ml    Additional Objective Labs: Basic Metabolic Panel: Recent Labs  Lab 07/15/23 0056 07/15/23 1838 07/17/23 0432 07/18/23 0549 07/19/23 1439  NA 127*   < > 130* 131* 131*  K 3.8   < > 4.4 4.0 3.8  CL 90*   < > 93* 96* 96*  CO2 23   < > 24 26 24   GLUCOSE 121*   < > 258* 145* 160*  BUN 31*   < > 30* 15 23  CREATININE 7.23*   < > 6.36* 4.29* 6.23*  CALCIUM  8.9   < > 8.9 8.6* 8.7*  PHOS 3.2  --   --   --  3.1   < > = values in this interval not displayed.   Liver Function Tests: Recent Labs  Lab 07/15/23 0056 07/19/23 1439  ALBUMIN  2.9* 2.7*   CBC: Recent Labs  Lab 07/15/23 0056 07/15/23 1838 07/16/23 0412 07/17/23 0432 07/18/23 0548 07/19/23 1439  WBC 7.0  --  17.0* 9.3 7.6 10.1  NEUTROABS 4.2  --  16.1* 6.8 5.2  --   HGB 11.6*   < > 11.1* 9.2* 9.0* 8.4*  HCT 37.0*   < > 35.1* 28.9* 27.7* 26.4*  MCV 83.3  --  84.0 83.8 83.7 85.2  PLT 142*  --  316 341 250 309   < > = values in this  interval not displayed.    Medications:  heparin  sodium (porcine)     vancomycin  (VANCOCIN ) 750 mg in sodium chloride  0.9 % 250 mL IVPB Stopped (07/19/23 1853)    (feeding supplement) PROSource Plus  30 mL Oral BID BM   aspirin  EC  325 mg Oral QHS   atorvastatin   80 mg Oral Daily   brimonidine   1 drop Both Eyes BID   calcitRIOL   0.5 mcg Oral Q M,W,F   carvedilol   3.125 mg Oral BID WC   Chlorhexidine  Gluconate Cloth  6 each Topical Q12H   [START ON 07/24/2023] darbepoetin (ARANESP ) injection - DIALYSIS  60 mcg Subcutaneous Q Wed-1800   dorzolamide -timolol   1 drop Both Eyes BID   heparin   5,000 Units Subcutaneous Q8H   insulin  aspart  0-9 Units Subcutaneous TID WC   insulin  glargine-yfgn  2 Units Subcutaneous QHS   lanthanum   1,000 mg Oral TID WC   latanoprost   1 drop Both Eyes QHS  lisinopril   20 mg Oral BID   melatonin  5 mg Oral QHS   polyethylene glycol  17 g Oral BID   senna-docusate  2 tablet Oral BID    Dialysis Orders: MWF - [facility] 3:30hr, 400/A1.5, EDW 69.9kg, 2K/2.5Ca, TDC, heparin  6500 unit bolus + 2900 unit mid-run bolus - Mircera 120mcg IV q 2 weeks - last 12/22 - Calcitriol  0.5mcg PO q HD   Assessment/Plan: Cervical discitis/osteomyelitis: Blood Cx Staph Epi - on Vanc 750 mcg IV q HD until 08/26/23 per ID. Cervical spine stenosis/compression: Neurosurgery following, s/p fusion 1/6. In CIR.  ESRD: Usual MWF schedule - Next HD 07/21/22. HTN/volume: BP controlled, UF as tolerated. +weight loss, will need lower dry on discharge.  Standing weight 63.7kg on 07/20/23.  Anemia of ESRD: Hgb 8.4 - cont ESA 60mcg Q Wednesday while admitted.  Secondary HPTH: Ca/phos ok - continue VDRA - no binder for now. Nutrition: Alb low, continue supplements. T2DM (with retinopathy/blindness)  Manuelita Labella, PA-C  [facility] 07/21/2023,12:42 PM  LOS: 3 days

## 2023-07-21 NOTE — IPOC Note (Signed)
 Overall Plan of Care [facility]) Patient Details Name: Shawn [name] MRN: [id number] DOB: 09-20-1960  Admitting Diagnosis: Osteomyelitis of cervical spine [facility])  Hospital Problems: Principal Problem:   Osteomyelitis of cervical spine (HCC)     Functional Problem List: Nursing Bowel, Pain, Safety, Medication Management, Endurance  PT Balance, Endurance, Motor, Safety, Sensory, Pain  OT Balance, Motor, Sensory, Nutrition, Skin Integrity, Pain, Endurance  SLP    TR         Basic ADL's: OT Eating, Grooming, Bathing, Dressing, Toileting     Advanced  ADL's: OT Simple Meal Preparation, Laundry, Light Housekeeping     Transfers: PT Bed Mobility, Bed to Chair, Customer Service Manager, Tub/Shower     Locomotion: PT Ambulation, Wheelchair Mobility     Additional Impairments: OT Fuctional Use of Upper Extremity  SLP        TR      Anticipated Outcomes Item Anticipated Outcome  Self Feeding Supervision  Swallowing      Basic self-care  Supervision  Toileting  Supervision   Bathroom Transfers Supervision  Bowel/Bladder  manage with MOD I  Transfers  supervision  Locomotion  supervision ambulatory  Communication     Cognition     Pain  < 4 with PRN  Safety/Judgment  manage with cueing   Therapy Plan: PT Intensity: Minimum of 1-2 x/day ,45 to 90 minutes PT Frequency: 5 out of 7 days PT Duration Estimated Length of Stay: 2-2.5 weeks OT Intensity: Minimum of 1-2 x/day, 45 to 90 minutes OT Frequency: 5 out of 7 days OT Duration/Estimated Length of Stay: 2 weeks     Team Interventions: Nursing Interventions Patient/Family Education, Bowel Management, Pain Management, Disease Management/Prevention, Discharge Planning, Medication Management  PT interventions Ambulation/gait training, Discharge planning, Functional mobility training, Psychosocial support, Therapeutic Activities, Visual/perceptual remediation/compensation, Metallurgist training, Neuromuscular  re-education, Therapeutic Exercise, DME/adaptive equipment instruction, Pain management, UE/LE Strength taining/ROM, Community reintegration, Equities Trader education, Museum/gallery curator, UE/LE Coordination activities  OT Interventions Warden/ranger, Community reintegration, Disease mangement/prevention, Development worker, international aid stimulation, Neuromuscular re-education, Patient/family education, Self Care/advanced ADL retraining, Splinting/orthotics, Therapeutic Exercise, UE/LE Coordination activities, Visual/perceptual remediation/compensation, UE/LE Strength taining/ROM, Therapeutic Activities, Wheelchair propulsion/positioning, Skin care/wound managment, Psychosocial support, Pain management, Functional mobility training, DME/adaptive equipment instruction, Discharge planning  SLP Interventions    TR Interventions    SW/CM Interventions Discharge Planning, Patient/Family Education, Psychosocial Support   Barriers to Discharge MD  Medical stability, IV antibiotics, and Hemodialysis  Nursing IV antibiotics, Hemodialysis one level and level entry with wife  PT Home environment access/layout requires increased assist for mobility at this time, increased fall risk  OT Hemodialysis, Wound Care HD 3x per week, blindness, new surgical incision  SLP      SW Decreased caregiver support, Lack of/limited family support     Team Discharge Planning: Destination: PT-Home ,OT- Home , SLP-  Projected Follow-up: PT-Home health PT, OT-  Home health OT, SLP-  Projected Equipment Needs: PT-To be determined, OT- Wheelchair cushion (measurements), Wheelchair (measurements), SLP-  Equipment Details: PT- , OT-has all DME but may need a w/c Patient/family involved in discharge planning: PT- Patient,  OT-Patient, Family member/caregiver, SLP-   MD ELOS: 14-18 days Medical Rehab Prognosis:  Good Assessment: The patient has been admitted for CIR therapies with the diagnosis of Osteomyelitis of cervical spine  causing myelopathy - nontraumatic SCI . The team will be addressing functional mobility, strength, stamina, balance, safety, adaptive techniques and equipment, self-care, bowel and bladder mgt, patient and caregiver education. Goals have been set  at sup. Anticipated discharge destination is home.        See Team Conference Notes for weekly updates to the plan of care

## 2023-07-21 NOTE — Progress Notes (Signed)
 PROGRESS NOTE   Subjective/Complaints:  Pt doing so much better than yesterday, had the enema and then had multiple BMs and feels like he was adequately emptied.  Slept well.  Pain well controlled.  Anuric at baseline.  Denies any other complaints or concerns today.   ROS:  + Abdominal pain-improved Denies fevers, chills, N/V, abdominal pain, diarrhea, SOB, cough, chest pain, new weakness or paraesthesias.    Objective:   No results found. Recent Labs    07/19/23 1439  WBC 10.1  HGB 8.4*  HCT 26.4*  PLT 309   Recent Labs    07/19/23 1439  NA 131*  K 3.8  CL 96*  CO2 24  GLUCOSE 160*  BUN 23  CREATININE 6.23*  CALCIUM  8.7*    Intake/Output Summary (Last 24 hours) at 07/21/2023 1041 Last data filed at 07/20/2023 1800 Gross per 24 hour  Intake 665.59 ml  Output --  Net 665.59 ml     Pressure Injury 07/18/23 Sacrum Mid Stage 1 -  Intact skin with non-blanchable redness of a localized area usually over a bony prominence. (Active)  07/18/23 1630 (Assessed by Ulla, LPN and Becky, RN)  Location: Sacrum  Location Orientation: Mid  Staging: Stage 1 -  Intact skin with non-blanchable redness of a localized area usually over a bony prominence.  Wound Description (Comments):   Present on Admission: Yes    Physical Exam: Vital Signs Blood pressure (!) 150/65, pulse 84, temperature 98.2 F (36.8 C), temperature source Oral, resp. rate 18, height 6' (1.829 m), weight 66.5 kg, SpO2 100%.  Constitutional: No apparent distress. Appropriate appearance for age.  Laying in bed.  HENT: Atraumatic, normocephalic. + Cervical collar Eyes: Bilateral blindness; sees some shadows/blurry figures but cannot track Cardiovascular: RRR, no murmurs/rub/gallops. No Edema. Peripheral pulses 2+  Respiratory: CTAB this morning, no coughing or coarseness. No rales, rhonchi, or wheezing. On RA.  Abdomen: + bowel sounds much more  normoactive.  Soft, nontender.  No distention.  Skin: C/D/I. No apparent lesions.  Peripheral IV intact.  ACDF site covered in Mepilex, clean.  PRIOR EXAMS: MSK:      No apparent deformity.  Difficulty with bilateral shoulder abduction due to pain.      Strength: Antigravity and against resistance equally in all 4 extremities, 3 out of 5 bilateral shoulder abduction, otherwise 4 out of 5 throughout.  Neurologic exam:  Cognition: AAO to person, place, time and event.  Language: Fluent, No substitutions or neoglisms. No dysarthria.  Insight: Good insight into current condition.  Mood: Pleasant affect, appropriate mood.  Sensation: Bilateral stocking pattern sensory neuropathy Reflexes: 1+ in BL UE and LEs.  1-2 beats of clonus bilateral lower extremities.  No Babinski, Hoffman's. CN: 2-12 grossly intact.  Spasticity: MAS 0 in all extremities.         Assessment/Plan: 1. Functional deficits which require 3+ hours per day of interdisciplinary therapy in a comprehensive inpatient rehab setting. Physiatrist is providing close team supervision and 24 hour management of active medical problems listed below. Physiatrist and rehab team continue to assess barriers to discharge/monitor patient progress toward functional and medical goals  Care Tool:  Bathing  Body parts bathed by patient: Face, Chest, Abdomen   Body parts bathed by helper: Right arm, Left arm, Front perineal area, Right lower leg, Left upper leg, Right upper leg, Buttocks, Left lower leg     Bathing assist Assist Level: Maximal Assistance - Patient 24 - 49%     Upper Body Dressing/Undressing Upper body dressing   What is the patient wearing?: Hospital gown only    Upper body assist Assist Level: Maximal Assistance - Patient 25 - 49%    Lower Body Dressing/Undressing Lower body dressing      What is the patient wearing?: Incontinence brief     Lower body assist Assist for lower body dressing: Total  Assistance - Patient < 25%     Toileting Toileting    Toileting assist Assist for toileting: Maximal Assistance - Patient 25 - 49%     Transfers Chair/bed transfer  Transfers assist     Chair/bed transfer assist level: Minimal Assistance - Patient > 75%     Locomotion Ambulation   Ambulation assist      Assist level: 2 helpers Assistive device: Walker-rolling Max distance: 37'   Walk 10 feet activity   Assist     Assist level: 2 helpers Assistive device: Walker-rolling   Walk 50 feet activity   Assist Walk 50 feet with 2 turns activity did not occur: Safety/medical concerns         Walk 150 feet activity   Assist Walk 150 feet activity did not occur: Safety/medical concerns         Walk 10 feet on uneven surface  activity   Assist Walk 10 feet on uneven surfaces activity did not occur: Safety/medical concerns         Wheelchair     Assist Is the patient using a wheelchair?: Yes Type of Wheelchair: Manual    Wheelchair assist level: Dependent - Patient 0%      Wheelchair 50 feet with 2 turns activity    Assist        Assist Level: Dependent - Patient 0%   Wheelchair 150 feet activity     Assist      Assist Level: Dependent - Patient 0%  Plan: Blood pressure (!) 150/65, pulse 84, temperature 98.2 F (36.8 C), temperature source Oral, resp. rate 18, height 6' (1.829 m), weight 66.5 kg, SpO2 100%. Medical Problem List and Plan: 1. 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  Antithrombotics: -DVT/anticoagulation:  Pharmaceutical: Heparin  5000U q8h             -antiplatelet therapy: continue aspirin  325 mg daily   3. Pain Management:  -Tylenol  as needed -Norco as  needed 5-10 mg q4 hours- might need Oxy if not enough- was taking IV Dilaudid  solely on Acute.              -Flexeril  10 mg TID as needed   -Pain well-controlled on current regimen, continue.  4. Mood/Behavior/Sleep: LCSW to evaluate and provide emotional support             -continue melatonin 5 mg q HS             -  antipsychotic agents: n/a   - Sleeping well   5. Neuropsych/cognition: This patient is capable of making decisions on his own behalf.   6. Skin/Wound Care: Routine skin care checks             -monitor surgical incisions   7. Fluids/Electrolytes/Nutrition: Strict Is and Os and follow-up chemistries per nephrology    - Admission labs with stable hyponatremia.  Follow q. weekly.  8: Hypertension: monitor TID and prn -continue amlodipine  5 mg daily>> appears it was d/c'd on admission? Changed to carvedilol  3.125mg  BID             -continue lisinopril  20 mg BID  - 1-10: Mild blood pressure elevations today.  Monitor for trend, continue increase in amlodipine . 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 , Cosopt , Xalantan gtts   11: ESRD: HD>>M,W,F via right internal jugular TDC             -continue calcitrol 0.5 mcg M,W,F             -continue Aranesp  60 mcg every Wednesday             -continue Fosrenol  1,000 mg TID with meals   12: DM-2: A1c = 5.8%; at home on Tresiba  Flextouch 12u at bedtime              -continue SSI             -continue Semglee  2 units q HS    - 1/11-12/25 Well-controlled on current regimen Recent Labs    07/20/23 1628 07/20/23 2127 07/21/23 0652  GLUCAP 130* 156* 106*     13: Constipation: continue MiraLax  daily, Senokot-S BID; prns ordered  - 1-10: Positive results with enema overnight.  Increase Mira ask to 17 g twice daily and Senokot 2S to 2 tabs twice daily.  Add docusate suppository for moderate constipation and enema for severe constipation.  No signs concerning for obstruction. -07/20/23 only small BMs recently, wants to try another soap suds enema, will do that to see if we decrease stool burden.  -07/21/23 enema yesterday helped significantly, cont meds for now, if oversoftened then would back off stool regimen.   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 Louis   16: History of prior CVA: on aspirin  and statin  17. Neurogenic bowel- LBM 9 days ago- will order Sorbitol to get him to go.  If doesn't work, will need soap suds enema, etc- might need KUB -See #13; had positive bowel movement with enema overnight, increasing bowel regimen and may need daily bowel program if no results in next 1 to 2 days   LOS: 3 days A FACE TO FACE EVALUATION WAS PERFORMED  [address] 07/21/2023, 10:41 AM

## 2023-07-22 LAB — GLUCOSE, CAPILLARY
Glucose-Capillary: 98 mg/dL (ref 70–99)
Glucose-Capillary: 133 mg/dL — ABNORMAL HIGH (ref 70–99)
Glucose-Capillary: 141 mg/dL — ABNORMAL HIGH (ref 70–99)
Glucose-Capillary: 167 mg/dL — ABNORMAL HIGH (ref 70–99)

## 2023-07-22 MED ORDER — BISACODYL 10 MG RE SUPP: 10.0000 mg | Freq: Every day | RECTAL | Status: DC

## 2023-07-22 MED ORDER — SENNOSIDES-DOCUSATE SODIUM 8.6-50 MG PO TABS
2.0000 | ORAL_TABLET | Freq: Every day | ORAL | Status: DC
  Administered 2023-07-25: 2 via ORAL
  Filled 2023-07-22 (×2): qty 2

## 2023-07-22 NOTE — Progress Notes (Signed)
 Occupational Therapy Session Note  Patient Details  Name: [name] MRN: [id number] Date of Birth: 22-Apr-1961  Today's Date: 07/22/2023 OT Individual Time: 0801-0900 1st Session; 1100-1200 2nd Session  OT Individual Time Calculation (min): 59 min, 60 min     Short Term Goals: Week 1:  OT Short Term Goal 1 (Week 1): Pt will self feed with set up, low vision strategies and built up utensils as needed OT Short Term Goal 2 (Week 1): Pt will stand sink side for 1 min during LB dressing or sink side activity with CGA OT Short Term Goal 3 (Week 1): Pt will don upper body clothing with min A  Skilled Therapeutic Interventions/Progress Updates:   Session 1: Pt seen for skilled OT session. Pt bed level for LB bathing as well as hair shampoo bag and neck bathing supine with pads changed in collar and rinsed soiled pads and left to dry on window sill. Educated throughout session on spinal precautions and use of AE including LH sponge, reacher and sock aide introduced verbally and tactile as pt blind. OT wll instruct further to allow for integration. Pt moved to EOB with CGA, donning pull on pants with max A. SPT to TIS with min A and cues. UB bathing with min A and dressing pullover shirt with min A. Set up for orl care and CGA. Stood briefly 3 times with RW during self are with min A and cues. Left with pt up in TIS with needs, nurse call button and chair alarm engaged.   Pain: denies pain just mild discomfort and tightness in neck and surrounding mm   Session 2:   Pt up in TIS reclined and resting upon OT arrival. OT dep transported pt to and from middle gym for therapy. Issued and trained in use of beige (lightest resistance). Table top seated gripping Bly while moist heat applied to L sh/neck mm and then to L side with + relief. Pt then completed 3 sets of B UE restorator table top level 25 reps forward and backward with rest between with 2 sets seated and 2 sets in supported standing with  min a for balance. Left with pt up in TIS with needs, nurse call button and chair alarm engaged.   Pain: stiffness with relief with moist heat and light UE therex with meds given earlier by nursing   Therapy Documentation Precautions:  Precautions Precautions: Fall, Cervical Precaution Comments: pt blind; functional in own enviroment; cervical brace OOB to be aplied in sitting Required Braces or Orthoses: Cervical Brace Cervical Brace: Hard collar Restrictions Weight Bearing Restrictions Per Provider Order: No  Vital Signs: Therapy Vitals Temp: 98.3 F (36.8 C) Temp Source: Oral Pulse Rate: 81 Resp: 18 BP: (!) 161/73 Patient Position (if appropriate): Lying Oxygen Therapy SpO2: 100 % O2 Device: Room Air   Therapy/Group: Individual Therapy  Geni CHRISTELLA Andreas 07/22/2023, 7:55 AM

## 2023-07-22 NOTE — Progress Notes (Signed)
 [facility] Progress Note   Subjective:   No c/o  Seen in room after lunch  For HD today  Objective Vitals:   07/21/23 1437 07/21/23 2032 07/22/23 0500 07/22/23 0508  BP: 138/62 (!) 166/74  (!) 161/73  Pulse: 75 79  81  Resp: 18 18  18   Temp: 97.8 F (36.6 C) 97.8 F (36.6 C)  98.3 F (36.8 C)  TempSrc:  Oral  Oral  SpO2: 100% 100%  100%  Weight:   66.8 kg   Height:       Physical Exam General: pleasant male in no acute distress Heart:RRR, 3/6 systolic murmur Lungs:CTA, no increased WOB, room air Abdomen: non-distended and non-tender, + BS, last BM 1/12 Extremities: no UE or LE edema Dialysis Access: Right [facility]  Additional Objective Labs: Basic Metabolic Panel: Recent Labs  Lab 07/17/23 0432 07/18/23 0549 07/19/23 1439  NA 130* 131* 131*  K 4.4 4.0 3.8  CL 93* 96* 96*  CO2 24 26 24   GLUCOSE 258* 145* 160*  BUN 30* 15 23  CREATININE 6.36* 4.29* 6.23*  CALCIUM  8.9 8.6* 8.7*  PHOS  --   --  3.1   Liver Function Tests: Recent Labs  Lab 07/19/23 1439  ALBUMIN  2.7*    CBC: Recent Labs  Lab 07/16/23 0412 07/17/23 0432 07/18/23 0548 07/19/23 1439  WBC 17.0* 9.3 7.6 10.1  NEUTROABS 16.1* 6.8 5.2  --   HGB 11.1* 9.2* 9.0* 8.4*  HCT 35.1* 28.9* 27.7* 26.4*  MCV 84.0 83.8 83.7 85.2  PLT 316 341 250 309    CBG: Recent Labs  Lab 07/21/23 0652 07/21/23 1646 07/21/23 2033 07/22/23 0644 07/22/23 1200  GLUCAP 106* 137* 152* 98 133*    Medications:  heparin  sodium (porcine)     vancomycin  (VANCOCIN ) 750 mg in sodium chloride  0.9 % 250 mL IVPB Stopped (07/19/23 1853)    (feeding supplement) PROSource Plus  30 mL Oral BID BM   aspirin  EC  325 mg Oral QHS   atorvastatin   80 mg Oral Daily   brimonidine   1 drop Both Eyes BID   calcitRIOL   0.5 mcg Oral Q M,W,F   carvedilol   3.125 mg Oral BID WC   Chlorhexidine  Gluconate Cloth  6 each Topical Q12H   [START ON 07/24/2023] darbepoetin (ARANESP ) injection - DIALYSIS  60 mcg Subcutaneous Q  Wed-1800   dorzolamide -timolol   1 drop Both Eyes BID   heparin   5,000 Units Subcutaneous Q8H   insulin  aspart  0-9 Units Subcutaneous TID WC   insulin  glargine-yfgn  2 Units Subcutaneous QHS   lanthanum   1,000 mg Oral TID WC   latanoprost   1 drop Both Eyes QHS   lisinopril   20 mg Oral BID   melatonin  5 mg Oral QHS   polyethylene glycol  17 g Oral BID   senna-docusate  2 tablet Oral BID    Dialysis Orders MWF - [facility] 3:30hr, 400/A1.5, EDW 69.9kg, 2K/2.5Ca, TDC, heparin  6500 unit bolus + 2900 unit mid-run bolus - Mircera 120mcg IV q 2 weeks - last 12/22 - Calcitriol  0.78mcg PO q HD  Assessment/Plan: Cervicaldiscitis/osteomyelitis: Blood culture showed Stap Epi. On Vanc 750 mcg IV q HD until 08/26/23 per ID Cervical spine stenosis/compression: Neurosurgery following, s/p fustion 1/6. In CIR ESRD:  HD today. Continue normal MWF HD schedule HTN/volume: BP high today. 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 Continue ESA 60 mcg Q Wednesday while in the hospital  Secondary  HPTH: Ca and Phos okay. No binder for now Nutrition: Albumin  low, continue supplements and ordered diet T2DM with retinopathy/blindness  Shawn KATHEE Gasman, MD  07/22/2023, 2:09 PM   [facility]

## 2023-07-22 NOTE — Progress Notes (Addendum)
 Patient ID: Shawn [name], male   DOB: [birth year], 63 y.o.   MRN: [id number]  [id number] spoke with pt wife to inform on ELOS. She would like to know if he will leave with his neck brace. Informed will have a medical member follow-up with her. SW will follow-up with updates after team conference.   Graeme Jude, MSW, LCSW Office: [phone] Cell: [phone] Fax: [phone]

## 2023-07-22 NOTE — Care Management (Signed)
 [facility] Individual Statement of Services  Patient Name:  Shawn [name]  Date:  07/22/2023  Welcome to the Inpatient Rehabilitation [facility].  Our goal is to provide you with an individualized program based on your diagnosis and situation, designed to meet your specific needs.  With this comprehensive rehabilitation program, you will be expected to participate in at least 3 hours of rehabilitation therapies Monday-Friday, with modified therapy programming on the weekends.  Your rehabilitation program will include the following services:  Physical Therapy (PT), Occupational Therapy (OT), 24 hour per day rehabilitation nursing, Therapeutic Recreaction (TR), Psychology, Neuropsychology, Care Coordinator, Rehabilitation Medicine, Nutrition Services, Pharmacy Services, and Other  Weekly team conferences will be held on Tuesdays to discuss your progress.  Your Inpatient Rehabilitation Care Coordinator will talk with you frequently to get your input and to update you on team discussions.  Team conferences with you and your family in attendance may also be held.  Expected length of stay: 2-2.5 weeks    Overall anticipated outcome: Supervision  Depending on your progress and recovery, your program may change. Your Inpatient Rehabilitation Care Coordinator will coordinate services and will keep you informed of any changes. Your Inpatient Rehabilitation Care Coordinator's name and contact numbers are listed  below.  The following services may also be recommended but are not provided by the Inpatient Rehabilitation Center:  Driving Evaluations Home Health Rehabiltiation Services Outpatient Rehabilitation Services Vocational Rehabilitation   Arrangements will be made to provide these services after discharge if needed.  Arrangements include referral to agencies that provide these services.  Your insurance has been verified to be:  Medicare A/B  Your primary doctor is:   Zachary Civatte  Pertinent information will be shared with your doctor and your insurance company.  Inpatient Rehabilitation Care Coordinator:  Graeme Feliciana SILK [phone] or [phone]  Information discussed with and copy given to patient by: Graeme DELENA Feliciana, 07/22/2023, 10:41 AM

## 2023-07-22 NOTE — Progress Notes (Signed)
 Inpatient Rehabilitation Care Coordinator Assessment and Plan Patient Details  Name: Shawn [name] MRN: [id number] Date of Birth: Jan 24, 1961  Today's Date: 07/22/2023  Hospital Problems: Principal Problem:   Osteomyelitis of cervical spine [facility])  Past Medical History:  Past Medical History:  Diagnosis Date   Anemia    Arthritis    patient denies   Blind right eye    Complication of anesthesia    Diabetes mellitus    type II   ESRD on hemodialysis [facility])    Dialysis since 2011 MWF   HCAP (healthcare-associated pneumonia) 12/19/2022   Hypertension    Legally blind    No pertinent past medical history    PONV (postoperative nausea and vomiting)    N/V- became dehydrated had to have IV fluids- 01/2015   Shortness of breath dyspnea    when has too fluid   Stroke (HCC) 07/09/2010   date per patient   Syncope    Unspecified cerebral artery occlusion with cerebral infarction 04/02/2013   Past Surgical History:  Past Surgical History:  Procedure Laterality Date   ANTERIOR CERVICAL CORPECTOMY N/A 07/15/2023   Procedure: Cervical six-Cervical seven CORPECTOMY;  Surgeon: Louis Shove, MD;  Location: [facility] OR;  Service: Neurosurgery;  Laterality: N/A;   AV FISTULA PLACEMENT Right 05/12/2013   Procedure: INSERTION OF ARTERIOVENOUS (AV) GORE-TEX GRAFT ARM; ULTRASOUND GUIDED;  Surgeon: Redell LITTIE Door, MD;  Location: [facility] OR;  Service: Vascular;  Laterality: Right;   AV FISTULA PLACEMENT Left 01/18/2015   Procedure: INSERTION OF ARTERIOVENOUS GORE-TEX GRAFT LEFT UPPER ARM;  Surgeon: Redell LITTIE Door, MD;  Location: [facility] OR;  Service: Vascular;  Laterality: Left;   COLONOSCOPY  06/2012   EYE SURGERY Bilateral    retina surgery both eyes, cataract surgery both eyes   HERNIA REPAIR     right inguinal   INSERTION OF DIALYSIS CATHETER Right    IR FLUORO GUIDE CV LINE RIGHT  07/12/2023   IR PTA VENOUS EXCEPT DIALYSIS CIRCUIT  07/12/2023   left arm graft  10/2010   LIGATION ARTERIOVENOUS GORTEX GRAFT Left  05/03/2015   Procedure: LIGATION ARTERIOVENOUS GORTEX GRAFT-LEFT UPPER ARM;  Surgeon: Redell LITTIE Door, MD;  Location: [facility] OR;  Service: Vascular;  Laterality: Left;   POSTERIOR CERVICAL FUSION/FORAMINOTOMY N/A 07/15/2023   Procedure: POSTERIOR CERVICAL FUSION /Cervical Five -Thoracic one;  Surgeon: Louis Shove, MD;  Location: [facility] OR;  Service: Neurosurgery;  Laterality: N/A;   Social History:  reports that he has never smoked. He has never used smokeless tobacco. He reports that he does not drink alcohol  and does not use drugs.  Family / Support Systems Marital Status: Married How Long?: 34 years Patient Roles: Spouse Spouse/Significant Other: Jauwanna (wife) [phone] Children: Pt has no children. Blended family. Pt raised wife's children from infancy. Calls them his children. Other Supports: [employer]One Anticipated Caregiver: wife Ability/Limitations of Caregiver: Wife is primary caregiver. Caregiver Availability: 24/7 Family Dynamics: Pt lives with his wife.  Social History Preferred language: English Religion: Baptist Cultural Background: Pt worked in [employer] for 29 years until he had to stop working due to health reasons in 2011 Education: high school grad Health Literacy - How often do you need to have someone help you when you read instructions, pamphlets, or other written material from your doctor or pharmacy?: Often Writes: Yes Employment Status: Disabled Date Retired/Disabled/Unemployed: Disabled 2011 due to [employer] Legal History/Current Legal Issues: Denies Guardian/Conservator: N/A   Abuse/Neglect Abuse/Neglect Assessment Can Be Completed: Yes Physical Abuse: Denies Verbal Abuse: Denies  Sexual Abuse: Denies Exploitation of patient/patient's resources: Denies Self-Neglect: Denies  Patient response to: Social Isolation - How often do you feel lonely or isolated from those around you?: Never  Emotional Status Pt's affect, behavior and adjustment status: Pt in good  spirits at time of visit Recent Psychosocial Issues: Denies Psychiatric History: Denies Substance Abuse History: Denies  Patient / Family Perceptions, Expectations & Goals Pt/Family understanding of illness & functional limitations: Pt and wife have a general understanding of care needs Premorbid pt/family roles/activities: independent with most physical needs. Some assistance with cognition and stairs. Anticipated changes in roles/activities/participation: Assistance with ADLs/IADLs Pt/family expectations/goals: Pt goal is to work on getting my strength back in neck and legs.  Community Resources [employer]: None Premorbid Home Care/DME Agencies: None Transportation available at discharge: TBD Is the patient able to respond to transportation needs?: Yes In the past 12 months, has lack of transportation kept you from medical appointments or from getting medications?: No In the past 12 months, has lack of transportation kept you from meetings, work, or from getting things needed for daily living?: No Resource referrals recommended: Neuropsychology  Discharge Planning Living Arrangements: Spouse/significant other Support Systems: Spouse/significant other Type of Residence: Private residence Insurance Resources: [employer] (specify) (BCBS supp) Financial Resources: [employer] Financial Screen Referred: No Living Expenses: Banker Management: Spouse, Patient Does the patient have any problems obtaining your medications?: No Home Management: Pt wife manages all homecare needs Patient/Family Preliminary Plans: No changes Care Coordinator Barriers to Discharge: Decreased caregiver support, Lack of/limited family support Care Coordinator Anticipated Follow Up Needs: HH/OP Expected length of stay: 2-2.5 weeks  Clinical Impression SW met with pt in room to introduce self, explain role, discuss discharge process, and inform on ELOS. Pt is not a cytogeneticist. No HCPOA.  DME: 3in1 BSC, RW and cane.   Britten Seyfried A Tattiana Fakhouri 07/22/2023, 3:24 PM

## 2023-07-22 NOTE — Progress Notes (Signed)
 PROGRESS NOTE   Subjective/Complaints:  Had large BM yesterday, with resolution of symptoms of abdominal pain.  He states that he is not able to control when his bowel movements come.  Has never been on a bowel program.  Agreeable to trial, states he is not someone who has a regular bowel movement in the a.m. and p.m., so agreeable to having it in the evenings.  Labs pending with dialysis today, blood sugars remain well-controlled BP mildly elevated overnight, 161/73.  Other vital stable. Patient eating approximately 10 to 30% of meals.  ROS:  + Abdominal pain-improved Denies fevers, chills, N/V, abdominal pain, diarrhea, SOB, cough, chest pain, new weakness or paraesthesias.    Objective:   No results found. Recent Labs    07/19/23 1439  WBC 10.1  HGB 8.4*  HCT 26.4*  PLT 309   Recent Labs    07/19/23 1439  NA 131*  K 3.8  CL 96*  CO2 24  GLUCOSE 160*  BUN 23  CREATININE 6.23*  CALCIUM  8.7*    Intake/Output Summary (Last 24 hours) at 07/22/2023 9191 Last data filed at 07/21/2023 2116 Gross per 24 hour  Intake 580 ml  Output --  Net 580 ml     Pressure Injury 07/18/23 Sacrum Mid Stage 1 -  Intact skin with non-blanchable redness of a localized area usually over a bony prominence. (Active)  07/18/23 1630 (Assessed by Ulla, LPN and Becky, RN)  Location: Sacrum  Location Orientation: Mid  Staging: Stage 1 -  Intact skin with non-blanchable redness of a localized area usually over a bony prominence.  Wound Description (Comments):   Present on Admission: Yes    Physical Exam: Vital Signs Blood pressure (!) 161/73, pulse 81, temperature 98.3 F (36.8 C), temperature source Oral, resp. rate 18, height 6' (1.829 m), weight 66.8 kg, SpO2 100%.  Constitutional: No apparent distress. Appropriate appearance for age.  Sitting up in bedside chair. HENT: Atraumatic, normocephalic. + Cervical  collar--well-fitting. Eyes: Bilateral blindness; sees some shadows/blurry figures but cannot track Cardiovascular: RRR, no murmurs/rub/gallops. No Edema. Peripheral pulses 2+  Respiratory: CTAB this morning, no coughing or coarseness. No rales, rhonchi, or wheezing. On RA.  Abdomen: + bowel sounds much more normoactive.  Soft, nontender.  No distention.  Skin: C/D/I. No apparent lesions.  Peripheral IV intact.  ACDF site covered in Mepilex, clean--1-13.  MSK:      No apparent deformity.  Difficulty with bilateral shoulder abduction due to pain.    Neurologic exam:  AAOx3.  Follows all simple commands.  No apparent cognitive deficits.  Cranial nerves intact.  Bilateral stocking glove pattern sensory neuropathy.  1-2 beats clonus bilateral lower extremities with ankle jerk; otherwise reflexes normal.  Strength 3 out of 5 in proximal shoulders, otherwise 4 out of 5 throughout.  No tone, spasticity, or ataxia.  Good insight into current deficits        Assessment/Plan: 1. Functional deficits which require 3+ hours per day of interdisciplinary therapy in a comprehensive inpatient rehab setting. Physiatrist is providing close team supervision and 24 hour management of active medical problems listed below. Physiatrist and rehab team continue to assess barriers to discharge/monitor patient  progress toward functional and medical goals  Care Tool:  Bathing    Body parts bathed by patient: Face, Chest, Abdomen   Body parts bathed by helper: Right arm, Left arm, Front perineal area, Right lower leg, Left upper leg, Right upper leg, Buttocks, Left lower leg     Bathing assist Assist Level: Maximal Assistance - Patient 24 - 49%     Upper Body Dressing/Undressing Upper body dressing   What is the patient wearing?: Hospital gown only    Upper body assist Assist Level: Maximal Assistance - Patient 25 - 49%    Lower Body Dressing/Undressing Lower body dressing      What is the patient  wearing?: Incontinence brief     Lower body assist Assist for lower body dressing: Total Assistance - Patient < 25%     Toileting Toileting    Toileting assist Assist for toileting: Maximal Assistance - Patient 25 - 49%     Transfers Chair/bed transfer  Transfers assist     Chair/bed transfer assist level: Minimal Assistance - Patient > 75%     Locomotion Ambulation   Ambulation assist      Assist level: 2 helpers Assistive device: Walker-rolling Max distance: 37'   Walk 10 feet activity   Assist     Assist level: 2 helpers Assistive device: Walker-rolling   Walk 50 feet activity   Assist Walk 50 feet with 2 turns activity did not occur: Safety/medical concerns         Walk 150 feet activity   Assist Walk 150 feet activity did not occur: Safety/medical concerns         Walk 10 feet on uneven surface  activity   Assist Walk 10 feet on uneven surfaces activity did not occur: Safety/medical concerns         Wheelchair     Assist Is the patient using a wheelchair?: Yes Type of Wheelchair: Manual    Wheelchair assist level: Dependent - Patient 0%      Wheelchair 50 feet with 2 turns activity    Assist        Assist Level: Dependent - Patient 0%   Wheelchair 150 feet activity     Assist      Assist Level: Dependent - Patient 0%  Plan: Blood pressure (!) 161/73, pulse 81, temperature 98.3 F (36.8 C), temperature source Oral, resp. rate 18, height 6' (1.829 m), weight 66.8 kg, SpO2 100%. Medical Problem List and Plan: 1. 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  Antithrombotics: -DVT/anticoagulation:  Pharmaceutical: Heparin  5000U q8h             -antiplatelet  therapy: continue aspirin  325 mg daily   3. Pain Management:  -Tylenol  as needed -Norco as needed 5-10 mg q4 hours- might need Oxy if not enough- was taking IV Dilaudid  solely on Acute.              -Flexeril  10 mg TID as needed   -Pain well-controlled on current regimen, continue.  4. Mood/Behavior/Sleep: LCSW to evaluate and provide emotional support             -  continue melatonin 5 mg q HS             -antipsychotic agents: n/a   - Sleeping well   5. Neuropsych/cognition: This patient is capable of making decisions on his own behalf.   6. Skin/Wound Care: Routine skin care checks             -monitor surgical incisions   7. Fluids/Electrolytes/Nutrition: Strict Is and Os and follow-up chemistries per nephrology    - Admission labs with stable hyponatremia.  Follow q. weekly.--Stable, unchanged  8: Hypertension: monitor TID and prn -continue amlodipine  5 mg daily>> appears it was d/c'd on admission? Changed to carvedilol  3.125mg  BID             -continue lisinopril  20 mg BID  - 1-10: Mild blood pressure elevations today.  Monitor for trend, continue increase in amlodipine . -07/20/23 BPs ok, monitor for now, appears amlodipine  was changed to carvedilol  during admission-- no med adjustments today -07/21/23 BPs a little variable, monitor trend today with dialysis, may need adjustments tomorrow Vitals:   07/19/23 1949 07/19/23 1951 07/19/23 2008 07/20/23 0443  BP: (!) 132/23 (!) 143/67 (!) 143/67 (!) 142/70   07/20/23 1441 07/20/23 2124 07/21/23 0510 07/21/23 1437  BP: 129/68 (!) 146/65 (!) 150/65 138/62   07/21/23 2032 07/22/23 0508 07/22/23 1453 07/22/23 1930  BP: (!) 166/74 (!) 161/73 (!) 146/72 (!) 163/71     9: Hyperlipidemia: continue atorvastatin  80mg  daily   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 , Cosopt , Xalantan gtts   11: ESRD: HD>>M,W,F via right internal jugular TDC             -continue calcitrol 0.5 mcg M,W,F             -continue Aranesp  60 mcg every  Wednesday             -continue Fosrenol  1,000 mg TID with meals   12: DM-2: A1c = 5.8%; at home on Tresiba  Flextouch 12u at bedtime             -continue SSI             -continue Semglee  2 units q HS    - 1/11-12/25 Well-controlled on current regimen Recent Labs    07/21/23 1646 07/21/23 2033 07/22/23 0644  GLUCAP 137* 152* 98     13: Constipation: continue MiraLax  daily, Senokot-S BID; prns ordered  - 1-10: Positive results with enema overnight.  Increase Miralax  to 17 g twice daily and Senokot 2S to 2 tabs twice daily.  Add docusate suppository for moderate constipation and enema for severe constipation.  No signs concerning for obstruction. -07/20/23 only small BMs recently, wants to try another soap suds enema, will do that to see if we decrease stool burden.  -07/21/23 enema yesterday helped significantly, cont meds for now, if oversoftened then would back off stool regimen.  1-13: Start bowel program as below.  Stools remain liquid, so we will DC daily MiraLAX  and have Senokot as 2 hours before bowel program at night.  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 Louis   16: History of prior CVA: on aspirin  and statin   17. Neurogenic bowel- LBM 9 days ago- will order Sorbitol to get him to go.  If doesn't work, will need soap suds enema, etc- might need KUB -See #13; had positive bowel movement with enema overnight, increasing bowel regimen and may  need daily bowel program if no results in next 1 to 2 days - 1/13: 2x enema with BM; discussed with patient as above, will start nightly bowel program at 8 PM with Magic bullet suppository and digital stim with no results.   LOS: 4 days A FACE TO FACE EVALUATION WAS PERFORMED  Shawn [name] Likes 07/22/2023, 8:08 AM

## 2023-07-22 NOTE — Progress Notes (Signed)
 Physical Therapy Session Note  Patient Details  Name: Shawn [name] MRN: [id number] Date of Birth: 04/04/1961  Today's Date: 07/22/2023 PT Individual Time: 0905-1015 PT Individual Time Calculation (min): 70 min   Short Term Goals: Week 1:  PT Short Term Goal 1 (Week 1): Pt will complete bed mobility with supervision PT Short Term Goal 2 (Week 1): Pt will complete transfers with CGA with LRAD PT Short Term Goal 3 (Week 1): Pt will ambulate 100' with RW with CGA PT Short Term Goal 4 (Week 1): Pt will tolerate OOB for 2 hours outside of therapies  Skilled Therapeutic Interventions/Progress Updates:    Pt presents in room seated in [facility], handoff from OT. Pt reporting significant improvement in abdominal pain secondary to BM on Saturday and Sunday, now reporting feeling a bobblehead sensation when standing but otherwise no concerns. Pt educated on c-collar to promote cervical stability with pt verbalizing understanding, denies sensation during ambulation this session. Session focused on gait training for tolerance to upright and dynamic postural stability with RW as well as therapeutic exercise for global strengthening and endurance.  Pt transported via WC to day room dependently for time management. Pt completes sit<>stand with min assist for RW for anterior wt shift. Pt ambulates 29' with RW min assist and mod verbal cues for direction and path deviation as pt demonstrating R path deviation unable to correct visually due to low vision. Pt takes extended seated rest break due to fatigue and ambulates with RW 100', cues for direction and to correct for R path deviation as well as positioning in RW as pt tends to stand to L within RW demonstrating hitting L toe on RW leg. Pt requires extended seated rest break.  Pt completes interval training on nustep 1 min work/1 min rest x3 rounds, 2 min work/26min rest x3 rounds at workload 4 with BUE/BLE, pt completes 217 steps during activity. Completed for  BUE/BLE strengthening and endurance.  Pt completes seated therex for BLE/core strengthening including: - hip abduction red band x20 - resisted LAQ red band x10 BLE - resisted hip/trunk extension red band x10 - weighted (2# med ball) modified sit up (from slightly reclined short sitting position) x10 - palloff press no back support 2# med ball x10 - sit <> stands x10  Pt provided with seated rest breaks between all gait trials and exercises to promote energy conservation and quality with tasks.  Pt returns to room and remains seated in [facility] with all needs within reach, cal light in place and chair alarm donned and activated at end of session.   Therapy Documentation Precautions:  Precautions Precautions: Fall, Cervical Precaution Comments: pt blind; functional in own enviroment; cervical brace OOB to be aplied in sitting Required Braces or Orthoses: Cervical Brace Cervical Brace: Hard collar Restrictions Weight Bearing Restrictions Per Provider Order: No  Therapy/Group: Individual Therapy  Reche Ohara PT, DPT 07/22/2023, 10:17 AM

## 2023-07-23 LAB — GLUCOSE, CAPILLARY
Glucose-Capillary: 105 mg/dL — ABNORMAL HIGH (ref 70–99)
Glucose-Capillary: 113 mg/dL — ABNORMAL HIGH (ref 70–99)
Glucose-Capillary: 126 mg/dL — ABNORMAL HIGH (ref 70–99)
Glucose-Capillary: 143 mg/dL — ABNORMAL HIGH (ref 70–99)

## 2023-07-23 MED ORDER — VANCOMYCIN HCL 1000 MG IV SOLR
750.0000 mg | Freq: Once | INTRAVENOUS | Status: AC
  Administered 2023-07-23: 750 mg via INTRAVENOUS
  Filled 2023-07-23: qty 15

## 2023-07-23 MED ORDER — VANCOMYCIN HCL 750 MG/150ML IV SOLN
750.0000 mg | Freq: Once | INTRAVENOUS | Status: DC
  Filled 2023-07-23: qty 150

## 2023-07-23 MED ORDER — HEPARIN SODIUM (PORCINE) 1000 UNIT/ML IJ SOLN
4000.0000 [IU] | Freq: Once | INTRAMUSCULAR | Status: AC
Start: 2023-07-23 — End: 2023-07-23
  Administered 2023-07-23: 4000 [IU]

## 2023-07-23 MED ORDER — HYDRALAZINE HCL 25 MG PO TABS: 25.0000 mg | ORAL_TABLET | Freq: Three times a day (TID) | ORAL | Status: DC | PRN

## 2023-07-23 MED ORDER — ACETAMINOPHEN 500 MG PO TABS
1000.0000 mg | ORAL_TABLET | Freq: Three times a day (TID) | ORAL | Status: DC | PRN
  Administered 2023-08-01 – 2023-08-03 (×2): 1000 mg via ORAL
  Filled 2023-07-23 (×3): qty 2

## 2023-07-23 MED ORDER — HYDROCODONE-ACETAMINOPHEN 5-325 MG PO TABS
1.0000 | ORAL_TABLET | ORAL | Status: DC | PRN
  Administered 2023-07-23: 1 via ORAL
  Filled 2023-07-23: qty 1

## 2023-07-23 MED ORDER — AMLODIPINE BESYLATE 5 MG PO TABS
5.0000 mg | ORAL_TABLET | Freq: Every day | ORAL | Status: DC
  Administered 2023-07-23 – 2023-08-04 (×11): 5 mg via ORAL
  Filled 2023-07-23 (×11): qty 1

## 2023-07-23 NOTE — Progress Notes (Signed)
 [facility] Progress Note   Subjective:   Completed dialysis early this am. No new complaints.   Objective Vitals:   07/23/23 0545 07/23/23 0600 07/23/23 0615 07/23/23 0721  BP: (!) 176/86 (!) 178/85 (!) 180/87 (!) 166/77  Pulse: 82 84 86 85  Resp: 11 11 12 18   Temp:    97.7 F (36.5 C)  TempSrc:      SpO2: 100% 100% 100% 100%  Weight:      Height:       Physical Exam General: pleasant male in no acute distress Heart: RRR, 3/6 systolic murmur Lungs: CTA, no increased WOB, room air Abdomen: non-distended and non-tender,  Extremities: no UE or LE edema Dialysis Access: Right [facility]  Additional Objective Labs: Basic Metabolic Panel: Recent Labs  Lab 07/17/23 0432 07/18/23 0549 07/19/23 1439  NA 130* 131* 131*  K 4.4 4.0 3.8  CL 93* 96* 96*  CO2 24 26 24   GLUCOSE 258* 145* 160*  BUN 30* 15 23  CREATININE 6.36* 4.29* 6.23*  CALCIUM  8.9 8.6* 8.7*  PHOS  --   --  3.1   Liver Function Tests: Recent Labs  Lab 07/19/23 1439  ALBUMIN  2.7*    CBC: Recent Labs  Lab 07/17/23 0432 07/18/23 0548 07/19/23 1439  WBC 9.3 7.6 10.1  NEUTROABS 6.8 5.2  --   HGB 9.2* 9.0* 8.4*  HCT 28.9* 27.7* 26.4*  MCV 83.8 83.7 85.2  PLT 341 250 309    CBG: Recent Labs  Lab 07/22/23 0644 07/22/23 1200 07/22/23 1658 07/22/23 2202 07/23/23 0723  GLUCAP 98 133* 141* 167* 105*    Medications:  heparin  sodium (porcine)     vancomycin  (VANCOCIN ) 750 mg in sodium chloride  0.9 % 250 mL IVPB Stopped (07/19/23 1853)    (feeding supplement) PROSource Plus  30 mL Oral BID BM   amLODipine   5 mg Oral Daily   aspirin  EC  325 mg Oral QHS   atorvastatin   80 mg Oral Daily   bisacodyl   10 mg Rectal QHS   brimonidine   1 drop Both Eyes BID   calcitRIOL   0.5 mcg Oral Q M,W,F   carvedilol   3.125 mg Oral BID WC   Chlorhexidine  Gluconate Cloth  6 each Topical Q12H   [START ON 07/24/2023] darbepoetin (ARANESP ) injection - DIALYSIS  60 mcg Subcutaneous Q Wed-1800    dorzolamide -timolol   1 drop Both Eyes BID   heparin   5,000 Units Subcutaneous Q8H   insulin  aspart  0-9 Units Subcutaneous TID WC   insulin  glargine-yfgn  2 Units Subcutaneous QHS   lanthanum   1,000 mg Oral TID WC   latanoprost   1 drop Both Eyes QHS   lisinopril   20 mg Oral BID   melatonin  5 mg Oral QHS   senna-docusate  2 tablet Oral QPC supper    Dialysis Orders MWF - [facility] 3:30hr, 400/A1.5, EDW 69.9kg, 2K/2.5Ca, TDC, heparin  6500 unit bolus + 2900 unit mid-run bolus - Mircera 120mcg IV q 2 weeks - last 12/22 - Calcitriol  0.25mcg PO q HD  Assessment/Plan: Cervicaldiscitis/osteomyelitis: Blood culture showed Stap Epi. On Vanc 750 mcg IV q HD until 08/26/23 per ID Cervical spine stenosis/compression: Neurosurgery following, s/p fustion 1/6. In CIR ESRD:  Continue normal MWF HD schedule HTN/volume: BP high today. 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 Continue ESA 60 mcg Q Wednesday while in the hospital  Secondary HPTH: Ca and Phos okay. No binder for now Nutrition: Albumin  low,  continue supplements and ordered diet T2DM with retinopathy/blindness Dispo: In CIR   Maisie Ronnald Acosta, PA-C  07/23/2023, 11:26 AM  [facility]

## 2023-07-23 NOTE — Progress Notes (Signed)
 Occupational Therapy Session Note  Patient Details  Name: Renaldo Gornick MRN: [id number] Date of Birth: 1961/04/05  Today's Date: 07/23/2023 OT Individual Time: Missed minutes 60 min; 2nd Session 1415-1525 OT Individual Time Calculation (min): 70 min    Short Term Goals: Week 1:  OT Short Term Goal 1 (Week 1): Pt will self feed with set up, low vision strategies and built up utensils as needed OT Short Term Goal 2 (Week 1): Pt will stand sink side for 1 min during LB dressing or sink side activity with CGA OT Short Term Goal 3 (Week 1): Pt will don upper body clothing with min A  Skilled Therapeutic Interventions/Progress Updates:  Session 1:  Pt declined therapy as pt asleep, awoken and reported they did not take him to HD until 215 am and he did not return until 7 am. Pt requesting to sleep. MD notified via secure chat and will attempt to make up as able.   Session 2:  Pt bed level agreeable to pm session. Pt declined offer for amb and toileting due to fatigue but eager for UE therex. Pt completed beige theraputty to retrieve 10 beads Bly with increased time and min cues due to some numbness to locate with finger tips. Pt then used 1 lb balls for alternating scap protraction/retraction and biceps curls 3 sets of 10 reps. 2 lb dumbell for bicep curls, forearm flips and wrist est 3 sets of 10 reps. Rests and pain check each set with no pain reported. OT eduated on spinal precuations throughout session as well as strategies for positioning and skin protection. OT suggested offloading buttocks with pillows or blanket roll but pt declined. Left bed level at end of session with exit alarm and needs in reach.   Pain: mild shoulder stiffness with un-rated pain  Therapy Documentation Precautions:  Precautions Precautions: Fall, Cervical Precaution Comments: pt blind; functional in own enviroment; cervical brace OOB to be aplied in sitting Required Braces or Orthoses: Cervical  Brace Cervical Brace: Hard collar Restrictions Weight Bearing Restrictions Per Provider Order: No   Therapy/Group: Individual Therapy  Geni CHRISTELLA Andreas 07/23/2023, 7:53 AM

## 2023-07-23 NOTE — Progress Notes (Signed)
 Advised by CSW of pt's d/c date of 1/28. Contacted [facility] to be advised of pt's d/c date and that pt should resume care on 1/29. Will assist as needed.   Olivia Canter Renal Navigator [phone]

## 2023-07-23 NOTE — Progress Notes (Addendum)
 Patient ID: Shawn [name], male   DOB: 01/05/1961, 63 y.o.   MRN: [id number]  [id number] unable to make contact with pt wife Shawn [name] on home or cell phone to provide updates from team conference, and d/c date. SW will continue to make efforts.   SW met with pt in room to provide updates from team conference, and d/c date 1/28. SW discussed HHA preference. No preference.   SW informed Shawn [name]/Dialysis Coord on pt discharge date.   SW sent HHPT/OT to Angie/[facility] and waiting on follow-up.   Shawn [name], MSW, LCSW Office: [phone] Cell: [phone] Fax: [phone]

## 2023-07-23 NOTE — Progress Notes (Addendum)
 PROGRESS NOTE   Subjective/Complaints:  No acute complaints.  No events overnight. Blood pressure remains elevated despite dialysis yesterday, and 170s today.  Otherwise vital stable. Patient states he was taken to dialysis late yesterday, did not get bowel program in the evening. Did have a bowel movement overnight, however was large, liquid. Gets occasional discomfort in the back of his neck at his surgical site, however overall pain well-controlled.  ROS:  + Abdominal pain-improved/resolved Denies fevers, chills, N/V, abdominal pain, diarrhea, SOB, cough, chest pain, new weakness or paraesthesias.    Objective:   No results found. No results for input(s): WBC, HGB, HCT, PLT in the last 72 hours.  No results for input(s): NA, K, CL, CO2, GLUCOSE, BUN, CREATININE, CALCIUM  in the last 72 hours.   Intake/Output Summary (Last 24 hours) at 07/23/2023 1052 Last data filed at 07/22/2023 2130 Gross per 24 hour  Intake 415 ml  Output --  Net 415 ml     Pressure Injury 07/18/23 Sacrum Mid Stage 1 -  Intact skin with non-blanchable redness of a localized area usually over a bony prominence. (Active)  07/18/23 1630 (Assessed by Ulla, LPN and Becky, RN)  Location: Sacrum  Location Orientation: Mid  Staging: Stage 1 -  Intact skin with non-blanchable redness of a localized area usually over a bony prominence.  Wound Description (Comments):   Present on Admission: Yes    Physical Exam: Vital Signs Blood pressure (!) 166/77, pulse 85, temperature 97.7 F (36.5 C), resp. rate 18, height 6' (1.829 m), weight 66.8 kg, SpO2 100%.  Constitutional: No apparent distress. Appropriate appearance for age.  Laying in bed. HENT: Atraumatic, normocephalic. + Cervical collar--well-fitting. Eyes: Bilateral blindness; sees some shadows/blurry figures but cannot track Cardiovascular: RRR, no murmurs/rub/gallops. No Edema.  Peripheral pulses 2+  Respiratory: CTAB this morning, no coughing or coarseness. No rales, rhonchi, or wheezing. On RA.  Abdomen: + bowel sounds much more normoactive.  Soft, nontender.  No distention.  Skin: C/D/I. No apparent lesions.  Peripheral IV intact.   + PCDF site with overlying postsurgical honeycomb dressing, with moderate amount of brownish drainage.  Minimal overlying fluctuance palpated. + Anterior surgical site with Steri-Strips intact, no active drainage, clean and dry.  MSK:      No apparent deformity.  Difficulty with bilateral shoulder abduction due to pain.--Not retested    Neurologic exam:  AAOx3.  Follows all simple commands.  No apparent cognitive deficits.  Cranial nerves intact.  Bilateral stocking glove pattern sensory neuropathy.  1-2 beats clonus bilateral lower extremities with ankle jerk; otherwise reflexes normal--no clonus appreciated today 1-14.  Strength 4- out of 5 in proximal shoulders, otherwise 5- out of 5 throughout.  No tone, spasticity, or ataxia.  Good insight into current deficits        Assessment/Plan: 1. Functional deficits which require 3+ hours per day of interdisciplinary therapy in a comprehensive inpatient rehab setting. Physiatrist is providing close team supervision and 24 hour management of active medical problems listed below. Physiatrist and rehab team continue to assess barriers to discharge/monitor patient progress toward functional and medical goals  Care Tool:  Bathing    Body parts bathed by patient:  Face, Chest, Abdomen   Body parts bathed by helper: Right arm, Left arm, Front perineal area, Right lower leg, Left upper leg, Right upper leg, Buttocks, Left lower leg     Bathing assist Assist Level: Maximal Assistance - Patient 24 - 49%     Upper Body Dressing/Undressing Upper body dressing   What is the patient wearing?: Hospital gown only    Upper body assist Assist Level: Maximal Assistance - Patient 25 - 49%     Lower Body Dressing/Undressing Lower body dressing      What is the patient wearing?: Incontinence brief     Lower body assist Assist for lower body dressing: Total Assistance - Patient < 25%     Toileting Toileting    Toileting assist Assist for toileting: Maximal Assistance - Patient 25 - 49%     Transfers Chair/bed transfer  Transfers assist     Chair/bed transfer assist level: Minimal Assistance - Patient > 75%     Locomotion Ambulation   Ambulation assist      Assist level: Minimal Assistance - Patient > 75% Assistive device: Walker-rolling Max distance: 100'   Walk 10 feet activity   Assist     Assist level: Minimal Assistance - Patient > 75% Assistive device: Walker-rolling   Walk 50 feet activity   Assist Walk 50 feet with 2 turns activity did not occur: Safety/medical concerns  Assist level: Minimal Assistance - Patient > 75% Assistive device: Walker-rolling    Walk 150 feet activity   Assist Walk 150 feet activity did not occur: Safety/medical concerns         Walk 10 feet on uneven surface  activity   Assist Walk 10 feet on uneven surfaces activity did not occur: Safety/medical concerns         Wheelchair     Assist Is the patient using a wheelchair?: Yes Type of Wheelchair: Manual    Wheelchair assist level: Dependent - Patient 0%      Wheelchair 50 feet with 2 turns activity    Assist        Assist Level: Dependent - Patient 0%   Wheelchair 150 feet activity     Assist      Assist Level: Dependent - Patient 0%  Plan: Blood pressure (!) 166/77, pulse 85, temperature 97.7 F (36.5 C), resp. rate 18, height 6' (1.829 m), weight 66.8 kg, SpO2 100%. Medical Problem List and Plan: 1. 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 Per nursing no SLP needs.    2.  Antithrombotics: -DVT/anticoagulation:  Pharmaceutical: Heparin  5000U q8h             -antiplatelet therapy: continue aspirin  325 mg daily   3. Pain Management:  -Tylenol  as needed -Norco as needed 5-10 mg q4 hours- might need Oxy if not enough- was taking IV Dilaudid  solely on Acute.              -Flexeril  10 mg TID as needed   -Pain well-controlled on current regimen, continue.  -1-14: Minimal use of PRNs.  Change Tylenol  to 1000 mg 3 times daily as  needed, reduce Norco to 5 mg every 6 hours as needed only for moderate to severe pain.  4. Mood/Behavior/Sleep: LCSW to evaluate and provide emotional support             -continue melatonin 5 mg q HS             -antipsychotic agents: n/a   - Sleeping well   5. Neuropsych/cognition: This patient is capable of making decisions on his own behalf.   6. Skin/Wound Care: Routine skin care checks             -monitor surgical incisions  -1-14: Has been 1 week since postop, may remove PCDF honeycomb dressing.  Due to drainage, will have nursing take photo of this with dressing change.   7. Fluids/Electrolytes/Nutrition: Strict Is and Os and follow-up chemistries per nephrology    - Admission labs with stable hyponatremia.  Follow q. weekly.--Stable, unchanged  8: Hypertension: monitor TID and prn -continue amlodipine  5 mg daily>> appears it was d/c'd on admission? Changed to carvedilol  3.125mg  BID             -continue lisinopril  20 mg BID  - 1-10: Mild blood pressure elevations today.  Monitor for trend, continue increase in amlodipine . -07/20/23 BPs ok, monitor for now, appears amlodipine  was changed to carvedilol  during admission-- no med adjustments today -07/21/23 BPs a little variable, monitor trend today with dialysis, may need adjustments tomorrow 1/14: add back  norvasc  5 mg dail, add Prn hydralazine  25 mg for SBP >180, DBP >110 Vitals:   07/22/23 1453 07/22/23 1930 07/23/23 0305 07/23/23 0330  BP: (!) 146/72 (!) 163/71 (!) 156/78 (!) 166/73   07/23/23 0400 07/23/23 0430 07/23/23 0500 07/23/23 0515  BP: (!) 176/78 (!) 176/82 (!) 175/80 (!) 180/88   07/23/23 0545 07/23/23 0600 07/23/23 0615 07/23/23 0721  BP: (!) 176/86 (!) 178/85 (!) 180/87 (!) 166/77     9: Hyperlipidemia: continue atorvastatin  80mg  daily   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 , Cosopt , Xalantan gtts   11: ESRD: HD>>M,W,F via right internal jugular TDC             -continue calcitrol 0.5 mcg M,W,F             -continue Aranesp  60 mcg every Wednesday             -continue Fosrenol  1,000 mg TID with meals   12: DM-2: A1c = 5.8%; at home on Tresiba  Flextouch 12u at bedtime             -continue SSI             -continue Semglee  2 units q HS    - Well-controlled on current regimen Recent Labs    07/22/23 1658 07/22/23 2202 07/23/23 0723  GLUCAP 141* 167* 105*     13: Constipation: continue MiraLax  daily, Senokot-S BID; prns ordered  - 1-10: Positive results with enema overnight.  Increase Miralax  to 17 g twice daily and Senokot 2S to 2 tabs twice daily.  Add docusate suppository for moderate constipation and enema for severe constipation.  No signs concerning for obstruction. -07/20/23 only small BMs recently, wants to try another soap suds enema, will do that to see if we decrease stool burden.  -07/21/23 enema yesterday helped significantly, cont meds for now, if oversoftened then would back off stool regimen.  1-13: Start bowel program as below.  Stools remain liquid, so we will DC daily MiraLAX  and have Senokot as 2 hours before bowel program  at night. - see #17  14: Discitis/osteomyelitis with Staph epidermidis bacteremia:  -continue vancomycin  750 mg with HD through 08/26/2023- will likely change to Chronic Doxycycline  after that, but need to reach out to ID  when close to d/c to verify -1/14: Seems to have missed dosed with Monday's dialysis, got additional dose today per pharmacy.   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 Malcolm 1-13, may remove for bathing and changing collar only.             -follow-up with Dr. Louis   16: History of prior CVA: on aspirin  and statin   17. Neurogenic bowel- LBM 9 days ago- will order Sorbitol to get him to go.  If doesn't work, will need soap suds enema, etc- might need KUB -See #13; had positive bowel movement with enema overnight, increasing bowel regimen and may need daily bowel program if no results in next 1 to 2 days - 1/13: 2x enema with BM; discussed with patient as above, will start nightly bowel program at 8 PM with Magic bullet suppository and digital stim with no results.  1-14.  Refused bowel program overnight.  Educated patient on importance of consistency with this, he is agreeable.  LOS: 5 days A FACE TO FACE EVALUATION WAS PERFORMED  Shawn [name] Likes 07/23/2023, 10:52 AM

## 2023-07-23 NOTE — Progress Notes (Signed)
 Physical Therapy Session Note  Patient Details  Name: Shawn [name] MRN: [id number] Date of Birth: [birth year]  Today's Date: 07/23/2023 PT Individual Time: 9169-9082 PT Individual Time Calculation (min): 47 min   Short Term Goals: Week 1:  PT Short Term Goal 1 (Week 1): Pt will complete bed mobility with supervision PT Short Term Goal 2 (Week 1): Pt will complete transfers with CGA with LRAD PT Short Term Goal 3 (Week 1): Pt will ambulate 100' with RW with CGA PT Short Term Goal 4 (Week 1): Pt will tolerate OOB for 2 hours outside of therapies  Skilled Therapeutic Interventions/Progress Updates:    Pt presents in room in bed eating breakfast, pt and pt RN communicates that he did not receive dialysis until 3am last night and did not return until 7am this morning and therefore received breakfast late. Pt missing therapy time to allow pt to rest and eat breakfast. Therapist reenters room at 830 and pt agreeable to PT. Pt denies pain in supine  Pt completes bed mobility with increased time supervision, cues for hand placement for use of hospital bed features. Pt completes sit<>stand with min assist, stand pivot transfer without device min assist, min verbal cues with pt using LUE to feel for WC during transfer.  Pt transported to main gym dependently for time management. Pt ambulates with RW 56' CGA/min assist, reporting feeling significant fatigue in BLEs requiring rescue WC sit by 2nd person. Pt then completes forward/backward ambulation 2x8' with min assist no UE support.  Pt completes pregait forward/backward single step x8 on RLE, but reports feeling sudden onset of lightheadedness and returned to sitting.   Pt provided with seated rest breaks between all gait trials and exercises to promote energy conservation and quality with tasks.  Pt transported back to room dependently and completes stand pivot transfer with min assist back to bed, returns to supine with min assist. Pt BP  assessed to be WNL, pt reporting resolution of light headedness once in supine. Pt remains in supine with all needs within reach, call light in place, bed alarm activated and RN present at end of session.  Therapy Documentation Precautions:  Precautions Precautions: Fall, Cervical Precaution Comments: pt blind; functional in own enviroment; cervical brace OOB to be aplied in sitting Required Braces or Orthoses: Cervical Brace Cervical Brace: Hard collar Restrictions Weight Bearing Restrictions Per Provider Order: No General: PT Amount of Missed Time (min): 28 Minutes PT Missed Treatment Reason: Other (Comment);Patient fatigue (pt just returned from dialysis session (3am-7am) and had not eaten breakfast)   Therapy/Group: Individual Therapy  Reche Ohara PT, DPT 07/23/2023, 12:41 PM

## 2023-07-23 NOTE — Patient Care Conference (Signed)
 Inpatient RehabilitationTeam Conference and Plan of Care Update Date: 07/23/2023   Time: 1052    Patient Name: Shawn [name]      Medical Record Number: [id number]  Date of Birth: January 27, 1961 Sex: Male         Room/Bed: 4W08C/4W08C-01 Payor Info: Payor: MEDICARE / Plan: MEDICARE PART A AND B / Product Type: *No Product type* /    Admit Date/Time:  07/18/2023  3:14 PM  Primary Diagnosis:  Osteomyelitis of cervical spine [facility])  Hospital Problems: Principal Problem:   Osteomyelitis of cervical spine [facility])    Expected Discharge Date: Expected Discharge Date: 08/06/23  Team Members Present: Physician leading conference: Dr. Joesph Likes Social Worker Present: Graeme Jude, LCSWA Nurse Present: Barnie Ronde, RN;Karen Hilliard, RN;Sidni Fusco Sula, RN PT Present: Elam Ohara, PT OT Present: Delon Sharps, OT SLP Present: Rosina Downy, SLP PPS Coordinator present : Burnard Mealing, OT     Current Status/Progress Goal Weekly Team Focus  Bowel/Bladder      Continent of bladder; require enemas for constipation    Continent of B/B    Monitor need for and effectiveness of laxatives/enemas   Swallow/Nutrition/ Hydration               ADL's   min A UB, mod A LB with spinal prec and cues due to blindness, min a transfers to elevated toilet, pt does not shower at baseline due to neck port for HD   Supervision   wife Lennette super supportive and home 24/7 for pt, HD 3 x per week, blindness since 2003, needs Family Educ, HHOT, he has all his DME except w/c if needed    Mobility   minA overall for bed mobility, transfers, ambulation with RW up to 100'   supervision ambulatory  barriers: low vision, endurance; gait training, global strengthening/endurance, NMR for dynamic standing balance and coordination    Communication                Safety/Cognition/ Behavioral Observations               Pain      Managed with prn meds    <4 w/ prns    Assess  effectiveness and need for prn meds  Skin      Stage 1 buttocks ; honeycomb dressing on his incision   Stage 1 healing     Assess skin q shift     Discharge Planning:  Pt will d/c to home with his wife who wil be primary caregiver. SW will confirm there are no barriers to discharge.   Team Discussion: Patient is admitted post cervical disc disorder with myelopathy. Limited by decrease vision and poor endurance.  Patient on target to meet rehab goals: yes, currently needs min A with lower body care and Min A with toileting. Able to ambulate up to 100 ft using a RW with min A. Goals for discharge set for supervision overall.  *See Care Plan and progress notes for long and short-term goals.   Revisions to Treatment Plan:  N/a  Teaching Needs: Medications, toileting, safety and transfers, etc  Current Barriers to Discharge: Hemodialysis  Possible Resolutions to Barriers: Family education, home health follow up services      Medical Summary Current Status: Medically complicated by hypertension, ESRD, constipation/neurogenic bowel, poor pain control, p.o. intakes, diabetes, and osteomyelitis on vancomycin   Barriers to Discharge: Inadequate Nutritional Intake;Self-care education;Uncontrolled Diabetes;Uncontrolled Hypertension;Uncontrolled Pain;Neurogenic Bowel & Bladder;Infection/IV Antibiotics   Possible Resolutions to [employer]: Monitoring of labs  with dialysis and on vancomycin , initiation of nightly bowel program for neurogenic bowel, adjusting diabetes and blood pressure medications, titrating pain medications to minimal tolerable dosing, encouraging p.o. intakes   Continued Need for Acute Rehabilitation Level of Care: The patient requires daily medical management by a physician with specialized training in physical medicine and rehabilitation for the following reasons: Direction of a multidisciplinary physical rehabilitation program to maximize functional independence  : Yes Medical management of patient stability for increased activity during participation in an intensive rehabilitation regime.: Yes Analysis of laboratory values and/or radiology reports with any subsequent need for medication adjustment and/or medical intervention. : Yes   I attest that I was present, lead the team conference, and concur with the assessment and plan of the team.   English Tomer Gayo 07/23/2023, 3:54 PM

## 2023-07-23 NOTE — Progress Notes (Signed)
 Pt returned from Dialysis via bed with transportation tech.   Hilton Sinclair BSN RN CMSRN 07/23/2023, 7:04 AM

## 2023-07-23 NOTE — Progress Notes (Signed)
 Pt transported to dialysis via bed with transportation tech. Hilton Sinclair BSN RN CMSRN 07/23/2023, 2:18 AM

## 2023-07-24 LAB — RENAL FUNCTION PANEL
Albumin: 2.6 g/dL — ABNORMAL LOW (ref 3.5–5.0)
Anion gap: 9 (ref 5–15)
BUN: 13 mg/dL (ref 8–23)
CO2: 30 mmol/L (ref 22–32)
Calcium: 8.5 mg/dL — ABNORMAL LOW (ref 8.9–10.3)
Chloride: 96 mmol/L — ABNORMAL LOW (ref 98–111)
Creatinine, Ser: 5.57 mg/dL — ABNORMAL HIGH (ref 0.61–1.24)
GFR, Estimated: 11 mL/min — ABNORMAL LOW (ref >60)
Glucose, Bld: 93 mg/dL (ref 70–99)
Phosphorus: 3.9 mg/dL (ref 2.5–4.6)
Potassium: 4 mmol/L (ref 3.5–5.1)
Sodium: 135 mmol/L (ref 135–145)

## 2023-07-24 LAB — GLUCOSE, CAPILLARY
Glucose-Capillary: 78 mg/dL (ref 70–99)
Glucose-Capillary: 131 mg/dL — ABNORMAL HIGH (ref 70–99)
Glucose-Capillary: 96 mg/dL (ref 70–99)
Glucose-Capillary: 147 mg/dL — ABNORMAL HIGH (ref 70–99)

## 2023-07-24 LAB — CBC
HCT: 27.5 % — ABNORMAL LOW (ref 39.0–52.0)
Hemoglobin: 8.6 g/dL — ABNORMAL LOW (ref 13.0–17.0)
MCH: 26.4 pg (ref 26.0–34.0)
MCHC: 31.3 g/dL (ref 30.0–36.0)
MCV: 84.4 fL (ref 80.0–100.0)
Platelets: 389 10*3/uL (ref 150–400)
RBC: 3.26 MIL/uL — ABNORMAL LOW (ref 4.22–5.81)
RDW: 16.1 % — ABNORMAL HIGH (ref 11.5–15.5)
WBC: 5.9 10*3/uL (ref 4.0–10.5)
nRBC: 0 % (ref 0.0–0.2)

## 2023-07-24 LAB — VANCOMYCIN, RANDOM: Vancomycin Rm: 25 ug/mL

## 2023-07-24 MED ORDER — POLYETHYLENE GLYCOL 3350 17 G PO PACK: 17.0000 g | PACK | Freq: Every day | ORAL | Status: DC | PRN

## 2023-07-24 MED ORDER — PENTAFLUOROPROP-TETRAFLUOROETH EX AERO
1.0000 | INHALATION_SPRAY | CUTANEOUS | Status: DC | PRN
Start: 2023-07-24 — End: 2023-07-24

## 2023-07-24 MED ORDER — BISACODYL 10 MG RE SUPP
10.0000 mg | Freq: Every day | RECTAL | Status: DC | PRN
  Filled 2023-07-24: qty 1

## 2023-07-24 MED ORDER — ANTICOAGULANT SODIUM CITRATE 4% (200MG/5ML) IV SOLN: 5.0000 mL | Status: DC | PRN

## 2023-07-24 MED ORDER — ALTEPLASE 2 MG IJ SOLR: 2.0000 mg | Freq: Once | INTRAMUSCULAR | Status: DC | PRN

## 2023-07-24 MED ORDER — LIDOCAINE-PRILOCAINE 2.5-2.5 % EX CREA: 1.0000 | TOPICAL_CREAM | CUTANEOUS | Status: DC | PRN

## 2023-07-24 MED ORDER — HEPARIN SODIUM (PORCINE) 1000 UNIT/ML DIALYSIS
1000.0000 [IU] | INTRAMUSCULAR | Status: DC | PRN
  Administered 2023-07-24: 2900 [IU]
  Filled 2023-07-24 (×2): qty 1

## 2023-07-24 MED ORDER — LIDOCAINE HCL (PF) 1 % IJ SOLN
5.0000 mL | INTRAMUSCULAR | Status: DC | PRN
Start: 2023-07-24 — End: 2023-07-24

## 2023-07-24 MED ORDER — HEPARIN SODIUM (PORCINE) 1000 UNIT/ML DIALYSIS
6500.0000 [IU] | INTRAMUSCULAR | Status: DC | PRN
  Administered 2023-07-24: 6500 [IU] via INTRAVENOUS_CENTRAL
  Filled 2023-07-24 (×2): qty 7

## 2023-07-24 NOTE — Progress Notes (Signed)
 Pharmacy Antibiotic Note  Shawn [name] is a 63 y.o. male admitted on 07/18/2023 with  MRSE discitis .  Pharmacy has been consulted for Vancomycin  dosing.  Patient is on HD MWF. Random Vancomycin  level at goal of 25. Continue current dose. End of treatment 08/26/23, nephrology to dose outpatient.  Pre-HD vanc still continues to be therapeutic (25).   Plan: Vancomycin  750 mg every HD MWF  Continue through 08/26/23 per ID Levels as indicated clinically  Height: 6' (182.9 cm) Weight: 66.8 kg (147 lb 3.2 oz) IBW/kg (Calculated) : 77.6  Temp (24hrs), Avg:98.3 F (36.8 C), Min:98.1 F (36.7 C), Max:98.6 F (37 C)  Recent Labs  Lab 07/18/23 0548 07/18/23 0549 07/19/23 1439 07/24/23 0528  WBC 7.6  --  10.1 5.9  CREATININE  --  4.29* 6.23*  --   VANCORANDOM  --   --   --  25    Estimated Creatinine Clearance: 11.6 mL/min (A) (by C-G formula based on SCr of 6.23 mg/dL (H)).    No Known Allergies  Dose adjustments this admission: N/a  Microbiology results: 12/31 MRSA PCR: negative 12/31 Bcx: GPC in clusters>>3/4 MRSE 1/2 Blood: negF 1/3 MRSA PCR: negative   Ivery Marking, PharmD, BCIDP, AAHIVP, CPP Infectious Disease Pharmacist 07/24/2023 7:56 AM

## 2023-07-24 NOTE — Progress Notes (Signed)
 Occupational Therapy Session Note  Patient Details  Name: Shawn [name] MRN: [id number] Date of Birth: 1960/07/15  Today's Date: 07/24/2023 OT Individual Time: 800-855 Session 1, 2952-8413 Session 2 OT Individual Time Calculation (min): 55 min, 70 min    Short Term Goals: Week 1:  OT Short Term Goal 1 (Week 1): Pt will self feed with set up, low vision strategies and built up utensils as needed OT Short Term Goal 2 (Week 1): Pt will stand sink side for 1 min during LB dressing or sink side activity with CGA OT Short Term Goal 3 (Week 1): Pt will don upper body clothing with min A  Skilled Therapeutic Interventions/Progress Updates:   Session 1:  Pt seen for am skilled OT session with focus on am self care re-training and functional mobility progression. Pt bed level upon OT arrival. Moved to sitting with bed rail and increased time and min cues for spinal precautions. Pt cues to sit to ensure VSS. Pt then able to perform sit to stand with min a/CGA and amb to regualr arm chair set sink side for AM care using RW. Pt doffed pull off clothing with min a. Sink side bathing and dressing pull on clothing with min a overall with figure 4 to reach LE's. Oral care with CGA with min cues for safety with forward lean. Pt amb from sink side to recliner with RW with min cues and CGA for safety and balance as well as directionality due to blindness. Once seated, OT assisted with skin protection and comfort in recline with chair pad alarm, needs and nurse call button in reach. Handoff to PT for session.   Pain: denies all pain    Session 2:  Pt seen for 2nd session with focus on therex/NMRE. Pt recliner level. SPT to TIS with RW with CGA and cues for vision loss and hand placement. Pt transported to and from by OT due to energy conservation. Pt able to transfer on and off therapy mat from TIS with RW with cues and CGA. Supine to and from sit to wedge with pillows for support with cues for spinal  alignment training. In supine pt able to complete scap, sh, elbow, forearm 3 sets of 10 reps with rests using grip ball and then 1 lb bar. Red tband for triceps press 10 reps x 2 sets Bly. Back in TIS, pt able to complete 25 revolutions forward and back on L 1 (no resistance) with rest between directions. Pt left recliner level with all needs, chair pad alarm active and nurse call button in reach.   Pain: denies any pain  Therapy Documentation Precautions:  Precautions Precautions: Fall, Cervical Precaution Comments: pt blind; functional in own enviroment; cervical brace OOB to be aplied in sitting Required Braces or Orthoses: Cervical Brace Cervical Brace: Hard collar Restrictions Weight Bearing Restrictions Per Provider Order: No   Therapy/Group: Individual Therapy  [name] 07/24/2023, 7:47 AM

## 2023-07-24 NOTE — Progress Notes (Signed)
Pt off the unit for Dialysis.

## 2023-07-24 NOTE — Progress Notes (Signed)
PROGRESS NOTE   Subjective/Complaints:  No acute complaints.  No events overnight. Doing very well today per therapies.  Blood pressure 130s to 140s over 70s today, improved. Eating 10 to 30% of meals per documentation. 2 small bowel movements overnight; patient refusing bowel program - states he is now able to feel urge and control bowel movements.   ROS:  Denies fevers, chills, N/V, abdominal pain, diarrhea, SOB, cough, chest pain, new weakness or paraesthesias.    Objective:   No results found. Recent Labs    07/24/23 0528  WBC 5.9  HGB 8.6*  HCT 27.5*  PLT 389    No results for input(s): "NA", "K", "CL", "CO2", "GLUCOSE", "BUN", "CREATININE", "CALCIUM" in the last 72 hours.   Intake/Output Summary (Last 24 hours) at 07/24/2023 0743 Last data filed at 07/23/2023 2115 Gross per 24 hour  Intake 110 ml  Output --  Net 110 ml     Pressure Injury 07/18/23 Sacrum Mid Stage 1 -  Intact skin with non-blanchable redness of a localized area usually over a bony prominence. (Active)  07/18/23 1630 (Assessed by Ivor Messier, LPN and Becky, RN)  Location: Sacrum  Location Orientation: Mid  Staging: Stage 1 -  Intact skin with non-blanchable redness of a localized area usually over a bony prominence.  Wound Description (Comments):   Present on Admission: Yes    Physical Exam: Vital Signs Blood pressure (!) 149/70, pulse 78, temperature 98.1 F (36.7 C), resp. rate 16, height 6' (1.829 m), weight 66.8 kg, SpO2 100%.  Constitutional: No apparent distress. Appropriate appearance for age.  Sitting up in bed.  HENT: Atraumatic, normocephalic. + Cervical collar--well-fitting. Eyes: Bilateral blindness; sees some shadows/blurry figures but cannot track Cardiovascular: RRR, no murmurs/rub/gallops. No Edema. Peripheral pulses 2+  Respiratory: CTAB this morning, no coughing or coarseness. No rales, rhonchi, or wheezing. On RA.  Abdomen:  + bowel sounds much more normoactive.  Soft, nontender.  No distention.  Skin:  + PCDF site - steri strips intact, moderate serosanguinous drainage + Anterior surgical site with Steri-Strips intact, no active drainage, clean and dry.  MSK:      No apparent deformity.  Difficulty with bilateral shoulder abduction >110 degrees due to pain    Neurologic exam:  AAOx3.  Follows all simple commands.  No apparent cognitive deficits.  Cranial nerves intact.  Bilateral stocking glove pattern sensory neuropathy. Strength 4- out of 5 in proximal shoulders, otherwise 5- out of 5 throughout.  No tone, spasticity, or ataxia.  Good insight into current deficits - unchanged 1/15        Assessment/Plan: 1. Functional deficits which require 3+ hours per day of interdisciplinary therapy in a comprehensive inpatient rehab setting. Physiatrist is providing close team supervision and 24 hour management of active medical problems listed below. Physiatrist and rehab team continue to assess barriers to discharge/monitor patient progress toward functional and medical goals  Care Tool:  Bathing    Body parts bathed by patient: Face, Chest, Abdomen   Body parts bathed by helper: Right arm, Left arm, Front perineal area, Right lower leg, Left upper leg, Right upper leg, Buttocks, Left lower leg     Bathing assist  Assist Level: Maximal Assistance - Patient 24 - 49%     Upper Body Dressing/Undressing Upper body dressing   What is the patient wearing?: Hospital gown only    Upper body assist Assist Level: Maximal Assistance - Patient 25 - 49%    Lower Body Dressing/Undressing Lower body dressing      What is the patient wearing?: Incontinence brief     Lower body assist Assist for lower body dressing: Total Assistance - Patient < 25%     Toileting Toileting    Toileting assist Assist for toileting: Maximal Assistance - Patient 25 - 49%     Transfers Chair/bed transfer  Transfers assist      Chair/bed transfer assist level: Minimal Assistance - Patient > 75%     Locomotion Ambulation   Ambulation assist      Assist level: Minimal Assistance - Patient > 75% Assistive device: Walker-rolling Max distance: 100'   Walk 10 feet activity   Assist     Assist level: Minimal Assistance - Patient > 75% Assistive device: Walker-rolling   Walk 50 feet activity   Assist Walk 50 feet with 2 turns activity did not occur: Safety/medical concerns  Assist level: Minimal Assistance - Patient > 75% Assistive device: Walker-rolling    Walk 150 feet activity   Assist Walk 150 feet activity did not occur: Safety/medical concerns         Walk 10 feet on uneven surface  activity   Assist Walk 10 feet on uneven surfaces activity did not occur: Safety/medical concerns         Wheelchair     Assist Is the patient using a wheelchair?: Yes Type of Wheelchair: Manual    Wheelchair assist level: Dependent - Patient 0%      Wheelchair 50 feet with 2 turns activity    Assist        Assist Level: Dependent - Patient 0%   Wheelchair 150 feet activity     Assist      Assist Level: Dependent - Patient 0%  Plan: Blood pressure (!) 149/70, pulse 78, temperature 98.1 F (36.7 C), resp. rate 16, height 6' (1.829 m), weight 66.8 kg, SpO2 100%. Medical Problem List and Plan: 1. 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 Per nursing no SLP needs.    2.  Antithrombotics: -DVT/anticoagulation:  Pharmaceutical: Heparin 5000U q8h             -antiplatelet therapy: continue aspirin 325 mg daily   3. Pain Management:  -Tylenol as needed -Norco as  needed 5-10 mg q4 hours- might need Oxy if not enough- was taking IV Dilaudid solely on Acute.              -Flexeril 10 mg TID as needed   -Pain well-controlled on current regimen, continue.  -1-14: Minimal use of PRNs.  Change Tylenol to 1000 mg 3 times daily as needed, reduce Norco to 5 mg every 6 hours as needed only for moderate to severe pain.  4. Mood/Behavior/Sleep: LCSW to evaluate and provide emotional support             -  continue melatonin 5 mg q HS             -antipsychotic agents: n/a   - Sleeping well   5. Neuropsych/cognition: This patient is capable of making decisions on his own behalf.   6. Skin/Wound Care: Routine skin care checks             -monitor surgical incisions  -1-14: Has been 1 week since postop, may remove PCDF honeycomb dressing.  Due to drainage, will have nursing take photo of this with dressing change. -- moderate serosanguinous drainage, continue close monitorring   7. Fluids/Electrolytes/Nutrition: Strict Is and Os and follow-up chemistries per nephrology    - Admission labs with stable hyponatremia.  Follow q. weekly.--Stable, unchanged  8: Hypertension: monitor TID and prn -continue amlodipine 5 mg daily>> appears it was d/c'd on admission? Changed to carvedilol 3.125mg  BID             -continue lisinopril 20 mg BID  - 1-10: Mild blood pressure elevations today.  Monitor for trend, continue increase in amlodipine. -07/20/23 BPs ok, monitor for now, appears amlodipine was changed to carvedilol during admission-- no med adjustments today -07/21/23 BPs a little variable, monitor trend today with dialysis, may need adjustments tomorrow 1/14: add back norvasc 5 mg dail, add Prn hydralazine 25 mg for SBP >180, DBP >110 1/15: Improving with norvasc; continue Vitals:   07/23/23 0330 07/23/23 0400 07/23/23 0430 07/23/23 0500  BP: (!) 166/73 (!) 176/78 (!) 176/82 (!) 175/80   07/23/23 0515 07/23/23 0545 07/23/23 0600 07/23/23 0615  BP: (!) 180/88 (!) 176/86  (!) 178/85 (!) 180/87   07/23/23 0721 07/23/23 1500 07/23/23 2025 07/24/23 0522  BP: (!) 166/77 (!) 158/79 138/67 (!) 149/70     9: Hyperlipidemia: continue atorvastatin 80mg  daily   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, Cosopt, Xalantan gtts   11: ESRD: HD>>M,W,F via right internal jugular TDC             -continue calcitrol 0.5 mcg M,W,F             -continue Aranesp 60 mcg every Wednesday             -continue Fosrenol 1,000 mg TID with meals   12: DM-2: A1c = 5.8%; at home on Tresiba Flextouch 12u at bedtime             -continue SSI             -continue Semglee 2 units q HS    - Well-controlled on current regimen Recent Labs    07/23/23 1639 07/23/23 2102 07/24/23 0522  GLUCAP 126* 143* 78     13: Constipation: continue MiraLax daily, Senokot-S BID; prns ordered-resolved  - 1-10: Positive results with enema overnight.  Increase Miralax to 17 g twice daily and Senokot 2S to 2 tabs twice daily.  Add docusate suppository for moderate constipation and enema for severe constipation.  No signs concerning for obstruction. -07/20/23 only small BMs recently, wants to try another soap suds enema, will do that to see if we decrease stool burden.  -07/21/23 enema yesterday helped significantly, cont meds for now, if oversoftened then would back off stool regimen.  1-13: Start bowel program as below.  Stools remain liquid, so we will DC daily MiraLAX and have Senokot as 2 hours before bowel program at night. - see #17  14: Discitis/osteomyelitis with Staph epidermidis bacteremia:  -continue vancomycin 750 mg with HD through 08/26/2023- will likely change to Chronic Doxycycline after  that, but need to reach out to ID when close to d/c to verify -1/14: Seems to have missed dosed with Monday's dialysis, got additional dose today per pharmacy.   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 Dutch Quint 1-13, may remove for bathing and changing collar only.             -follow-up with Dr. Jordan Likes   16: History of prior CVA: on aspirin and statin   17. Neurogenic bowel- resolved If doesn't work, will need soap suds enema, etc- might need KUB -See #13; had positive bowel movement with enema overnight, increasing bowel regimen and may need daily bowel program if no results in next 1 to 2 days - 1/13: 2x enema with BM; discussed with patient as above, will start nightly bowel program at 8 PM with Magic bullet suppository and digital stim with no results.  1-14.  Refused bowel program overnight.  Educated patient on importance of consistency with this, he is agreeable.  1/15: Patient now having daily, continent bowel movements. All bowel meds moved to PRN.   LOS: 6 days A FACE TO FACE EVALUATION WAS PERFORMED  Angelina Sheriff 07/24/2023, 7:43 AM

## 2023-07-24 NOTE — Progress Notes (Signed)
 [facility] Progress Note   Subjective:   Seen in room. Up at sink, brushing his teeth. Doing well  No new events overnight. No cp/dyspnea For dialysis today   Objective Vitals:   07/23/23 0721 07/23/23 1500 07/23/23 2025 07/24/23 0522  BP: (!) 166/77 (!) 158/79 138/67 (!) 149/70  Pulse: 85 83 84 78  Resp: 18 19 16 16   Temp: 97.7 F (36.5 C) 98.6 F (37 C) 98.2 F (36.8 C) 98.1 F (36.7 C)  TempSrc:  Oral    SpO2: 100% 97% 100% 100%  Weight:      Height:       Physical Exam General: pleasant male in no acute distress Heart: RRR, 3/6 systolic murmur Lungs: CTA, no increased WOB, room air Abdomen: non-distended and non-tender,  Extremities: no UE or LE edema Dialysis Access: Right [facility]  Additional Objective Labs: Basic Metabolic Panel: Recent Labs  Lab 07/18/23 0549 07/19/23 1439 07/24/23 0528  NA 131* 131* 135  K 4.0 3.8 4.0  CL 96* 96* 96*  CO2 26 24 30   GLUCOSE 145* 160* 93  BUN 15 23 13   CREATININE 4.29* 6.23* 5.57*  CALCIUM  8.6* 8.7* 8.5*  PHOS  --  3.1 3.9   Liver Function Tests: Recent Labs  Lab 07/19/23 1439 07/24/23 0528  ALBUMIN  2.7* 2.6*    CBC: Recent Labs  Lab 07/18/23 0548 07/19/23 1439 07/24/23 0528  WBC 7.6 10.1 5.9  NEUTROABS 5.2  --   --   HGB 9.0* 8.4* 8.6*  HCT 27.7* 26.4* 27.5*  MCV 83.7 85.2 84.4  PLT 250 309 389    CBG: Recent Labs  Lab 07/23/23 0723 07/23/23 1138 07/23/23 1639 07/23/23 2102 07/24/23 0522  GLUCAP 105* 113* 126* 143* 78    Medications:  heparin  sodium (porcine)     vancomycin  (VANCOCIN ) 750 mg in sodium chloride  0.9 % 250 mL IVPB Stopped (07/19/23 1853)    (feeding supplement) PROSource Plus  30 mL Oral BID BM   amLODipine   5 mg Oral Daily   aspirin  EC  325 mg Oral QHS   atorvastatin   80 mg Oral Daily   bisacodyl   10 mg Rectal QHS   brimonidine   1 drop Both Eyes BID   calcitRIOL   0.5 mcg Oral Q M,W,F   carvedilol   3.125 mg Oral BID WC   Chlorhexidine  Gluconate Cloth  6  each Topical Q12H   darbepoetin (ARANESP ) injection - DIALYSIS  60 mcg Subcutaneous Q Wed-1800   dorzolamide -timolol   1 drop Both Eyes BID   heparin   5,000 Units Subcutaneous Q8H   insulin  aspart  0-9 Units Subcutaneous TID WC   insulin  glargine-yfgn  2 Units Subcutaneous QHS   lanthanum   1,000 mg Oral TID WC   latanoprost   1 drop Both Eyes QHS   lisinopril   20 mg Oral BID   melatonin  5 mg Oral QHS   senna-docusate  2 tablet Oral QPC supper    Dialysis Orders MWF - [place] 3:30hr, 400/A1.5, EDW 69.9kg, 2K/2.5Ca, TDC, heparin  6500 unit bolus + 2900 unit mid-run bolus - Mircera 120mcg IV q 2 weeks - last 12/22 - Calcitriol  0.85mcg PO q HD  Assessment/Plan: Cervicaldiscitis/osteomyelitis: Blood culture showed Stap Epi. On Vanc 750 mcg IV q HD until 08/26/23 per ID Cervical spine stenosis/compression: Neurosurgery following, s/p fusion 1/6. ESRD:  Continue normal MWF HD schedule HTN/volume: BP elevated. 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 No binder for now Nutrition: Albumin  low, continue supplements and ordered diet T2DM with retinopathy/blindness Dispo: In CIR   Elona Hal, PA-C  07/24/2023, 8:23 AM  [facility]

## 2023-07-24 NOTE — Progress Notes (Signed)
 Received patient in bed to unit.  Alert and oriented.  Informed consent signed and in chart.   TX duration:3  Patient tolerated well.  Transported back to the room  Alert, without acute distress.  Hand-off given to patient's nurse.   Access used: RIGHT DIALYSIS CATHETER Access issues: NONE  Total UF removed: 2L Medication(s) given: VANCOMYCIN    07/24/23 1659  Vitals  Temp 98 F (36.7 C)  Temp Source Oral  BP 123/70  MAP (mmHg) 86  BP Location Right Arm  BP Method Automatic  Patient Position (if appropriate) Lying  Pulse Rate 87  Pulse Rate Source Monitor  ECG Heart Rate 86  Resp 19  Oxygen Therapy  SpO2 100 %  O2 Device Room Air  During Treatment Monitoring  Blood Flow Rate (mL/min) 399 mL/min  Arterial Pressure (mmHg) -191.5 mmHg  Venous Pressure (mmHg) 174.33 mmHg  TMP (mmHg) 2.83 mmHg  Ultrafiltration Rate (mL/min) 761 mL/min  Dialysate Flow Rate (mL/min) 299 ml/min  Duration of HD Treatment -hour(s) 2.98 hour(s)  Cumulative Fluid Removed (mL) per Treatment  1987.59  HD Safety Checks Performed Yes  Intra-Hemodialysis Comments Tx completed;Tolerated well  Dialysis Fluid Bolus Normal Saline  Bolus Amount (mL) 300 mL      Shawn [name] [facility]

## 2023-07-24 NOTE — Progress Notes (Signed)
 Physical Therapy Session Note  Patient Details  Name: Shawn [name] MRN: [id number] Date of Birth: February 05, 1961  Today's Date: 07/24/2023 PT Individual Time: 8657-8469 PT Individual Time Calculation (min): 82 min   Short Term Goals: Week 1:  PT Short Term Goal 1 (Week 1): Pt will complete bed mobility with supervision PT Short Term Goal 2 (Week 1): Pt will complete transfers with CGA with LRAD PT Short Term Goal 3 (Week 1): Pt will ambulate 100' with RW with CGA PT Short Term Goal 4 (Week 1): Pt will tolerate OOB for 2 hours outside of therapies  Skilled Therapeutic Interventions/Progress Updates:    Pt presents in room in bed, agreeable to session. Pt denies pain. Session focused on gait training for tolerance to upright and decreasing UE support as well as therapeutic exercise for BLE strengthening and endurance.  Pt completes sit to stand from recliner to RW CGA, completes ambulatory transfer to [facility], navigating obstacles in room with min assist for RW mgmt, CGA for postural stability. Pt transported via WC to main gym dependently for time management.  Pt ambulates 2x95' with RW min assist for RW mgmt navigating obstacles and turns with mod assist for cues due to low vision. Pt requires extended rest break between gait trials for energy conservation due to fatigue. Pt demonstrating improved postural stability but continues to demonstrate bilateral flexed knees in stance and decreased stride length and heel strike.  Pt positioned in //bars, completes gait forward/backward with CGA/light min assist without UE support demonstrating significant improvement in postural stability compared to previous session. Pt transported to day and pt ambulates 2x50' with [facility] with CGA/min assist, increased postural sway and inconsistent cadence with fatigue.  Pt completes forward/backward ambulation 3x8' with [facility] CGA/light min assist, demonstrating good sequencing with posterior ambulation.  Pt then  completes standing NMR for single limb stability, multidirectional stepping stability, and dynamic postural balance with RUE support on SPC including: - standing marches 3x10 alternating BLE (cues for increasing step height and decreasing cadence with pt improving with each trial) - forward step x10 BLE (rest breaks between trialing bilaterally) - lateral step x10 BLE - backwards step x10 BLE  Pt educated during rest breaks on balance systems and importance of repetition and challenging balance to improve stability and decrease fall risk with pt verbalizing understanding.  Pt completes ambulatory transfer with SPC to nustep ~20' with CGA/min assist and cues for positioning prior to sitting due to low vision. Pt completes continuous training on nustep workload 3 with BUE/BLE for 8 min maintaining consistent 24 spm.  Pt returns to room and completes ambulatory transfer with [facility] with CGA/min assist and min cues back to recliner. Pt remains seated in recliner with all needs within reach, cal light in place and chair alarm donned and activated at end of session.   Therapy Documentation Precautions:  Precautions Precautions: Fall, Cervical Precaution Comments: pt blind; functional in own enviroment; cervical brace OOB to be aplied in sitting Required Braces or Orthoses: Cervical Brace Cervical Brace: Hard collar Restrictions Weight Bearing Restrictions Per Provider Order: No   Therapy/Group: Individual Therapy  Annia Kilts PT, DPT 07/24/2023, 10:20 AM

## 2023-07-25 LAB — GLUCOSE, CAPILLARY
Glucose-Capillary: 133 mg/dL — ABNORMAL HIGH (ref 70–99)
Glucose-Capillary: 78 mg/dL (ref 70–99)
Glucose-Capillary: 122 mg/dL — ABNORMAL HIGH (ref 70–99)
Glucose-Capillary: 109 mg/dL — ABNORMAL HIGH (ref 70–99)

## 2023-07-25 MED ORDER — HYDROCODONE-ACETAMINOPHEN 5-325 MG PO TABS
0.5000 | ORAL_TABLET | Freq: Two times a day (BID) | ORAL | Status: DC | PRN
  Administered 2023-07-26 – 2023-07-29 (×4): 1 via ORAL
  Filled 2023-07-25 (×4): qty 1

## 2023-07-25 NOTE — Progress Notes (Signed)
[facility] Progress Note   Subjective:   Dialysis yesterday - no issues No new events overnight. No cp/dyspnea Doing great   Objective Vitals:   07/24/23 2023 07/25/23 0438 07/25/23 0804 07/25/23 0805  BP: (!) 140/62 (!) 123/57 (!) 142/70 (!) 142/71  Pulse: 86 73 85   Resp: 18 18 18    Temp: 98.1 F (36.7 C) 98.1 F (36.7 C)    TempSrc:      SpO2: 100% 98%    Weight:      Height:       Physical Exam General: pleasant male in no acute distress Heart: RRR, 3/6 systolic murmur Lungs: CTA, no increased WOB, room air Abdomen: non-distended and non-tender,  Extremities: no UE or LE edema Dialysis Access: Right [facility]  Additional Objective Labs: Basic Metabolic Panel: Recent Labs  Lab 07/19/23 1439 07/24/23 0528  NA 131* 135  K 3.8 4.0  CL 96* 96*  CO2 24 30  GLUCOSE 160* 93  BUN 23 13  CREATININE 6.23* 5.57*  CALCIUM 8.7* 8.5*  PHOS 3.1 3.9   Liver Function Tests: Recent Labs  Lab 07/19/23 1439 07/24/23 0528  ALBUMIN 2.7* 2.6*    CBC: Recent Labs  Lab 07/19/23 1439 07/24/23 0528  WBC 10.1 5.9  HGB 8.4* 8.6*  HCT 26.4* 27.5*  MCV 85.2 84.4  PLT 309 389    CBG: Recent Labs  Lab 07/24/23 0522 07/24/23 1226 07/24/23 1800 07/24/23 2112 07/25/23 0626  GLUCAP 78 131* 96 147* 133*    Medications:  heparin sodium (porcine)     vancomycin (VANCOCIN) 750 mg in sodium chloride 0.9 % 250 mL IVPB Stopped (07/24/23 1750)    (feeding supplement) PROSource Plus  30 mL Oral BID BM   amLODipine  5 mg Oral Daily   aspirin EC  325 mg Oral QHS   atorvastatin  80 mg Oral Daily   brimonidine  1 drop Both Eyes BID   calcitRIOL  0.5 mcg Oral Q M,W,F   carvedilol  3.125 mg Oral BID WC   Chlorhexidine Gluconate Cloth  6 each Topical Q12H   darbepoetin (ARANESP) injection - DIALYSIS  60 mcg Subcutaneous Q Wed-1800   dorzolamide-timolol  1 drop Both Eyes BID   heparin  5,000 Units Subcutaneous Q8H   insulin aspart  0-9 Units Subcutaneous TID WC    insulin glargine-yfgn  2 Units Subcutaneous QHS   lanthanum  1,000 mg Oral TID WC   latanoprost  1 drop Both Eyes QHS   lisinopril  20 mg Oral BID   melatonin  5 mg Oral QHS   senna-docusate  2 tablet Oral QPC supper    Dialysis Orders MWF - [place] 3:30hr, 400/A1.5, EDW 69.9kg, 2K/2.5Ca, TDC, heparin 6500 unit bolus + 2900 unit mid-run bolus - Mircera IV q 2 weeks - last 12/22 - Calcitriol 0.40mcg PO q HD  Assessment/Plan: Cervicaldiscitis/osteomyelitis: Blood culture showed Stap Epi. On Vanc 750 mcg IV q HD until 08/26/23 per ID Cervical spine stenosis/compression: Neurosurgery following, s/p fusion 1/6. ESRD:  Continue normal MWF HD schedule HTN/volume: BP improved.  Continue with Carvedilol 3.125 mg and Lisinopril 20 mg, amlodipine 5mg  added 1/14 Anemia of ESRD: Hgb 8.6 Continue ESA 60 mcg Q Wednesday while in the hospital  Secondary HPTH: Ca and Phos okay. No binder for now Nutrition: Albumin low, continue supplements and ordered diet T2DM with retinopathy/blindness Dispo: In CIR Discharge date 1/28.   Tomasa Blase, PA-C  07/25/2023, 10:11 AM  [facility]

## 2023-07-25 NOTE — Progress Notes (Signed)
Physical Therapy Session Note  Patient Details  Name: Shawn [name] MRN: [id number] Date of Birth: [birth year]  Today's Date: 07/25/2023 PT Individual Time: 9629-5284 + 1002-1101 PT Individual Time Calculation (min): 35 min + 59 min  Short Term Goals: Week 1:  PT Short Term Goal 1 (Week 1): Pt will complete bed mobility with supervision PT Short Term Goal 2 (Week 1): Pt will complete transfers with CGA with LRAD PT Short Term Goal 3 (Week 1): Pt will ambulate 100' with RW with CGA PT Short Term Goal 4 (Week 1): Pt will tolerate OOB for 2 hours outside of therapies  Skilled Therapeutic Interventions/Progress Updates:    SESSION 1: Pt presents in room in bed, RN in room stating he has just been set up for breakfast, needing increased time to eat. Therapist reenters room at 830 with pt agreeable to PT and denies pain, missing 30 minutes to finish breakfast. Session focused on therapeutic activities for standing balance and standing tolerance, gait training for tolerance to upright.  Pt completes bed mobility with supervision. Pt reporting feeling like he needs to change his brief, pt compltes stand step transfer with RW CGA with verbal cues. Pt remains standing at RW for ~3 min with supervision with perturbations for therapist to manage brief and periarea hygiene. Pt sits in [facility] and transported to main gym dependently for time management.  Pt ambulates with RW 175' with CGA/min assist for managing RW with increased posterior postural sway with fatigue however pt denies shortness of breath or light headedness.  Pt returns to room and completes stand step transfer with RW to recliner. Pt remains seated in WC with all needs within reach, cal light in place and chair alarm activated at end of session.   SESSION 2: Pt presents in room in recliner, agreeable to PT, denies pain. Session focused on gait training with decreasing UE support as well as therapeutic exercise for BUE/BLE strengthening  and global endurance.  Pt completes stand step transfer with SPC min assist for postural stability. Pt remains standing with unilateral UE support on SPC with perturbations for changing brief as pt states previous brief felt like it was falling during ambulation in previous session. Pt then transported to day room dependently for time management and energy conservation. Pt then completes gait ~60' with SPC min/mod assist with pt reporting feeling BLE weakness, SOB, and light headedness. Pt requires extended seated rest break to recover. Pt then completes 2x30' ambulation with SPC min assist demonstrates improved postural stability decreased fatigue noted requires decreased rest break between trials.  Pt ambulates with min assist and SPC to/from nustep. Pt compeltes continuous training on nustep with BUE/BLE for 5 min BLE only for 5 min at workload 4 then 3. Pt maintains 25 SPM throughout activity.  Pt returns to room and completes stand step transfer with SPC to recliner with min assist. Pt remains seated in recliner with all needs within reach, cal light in place and chair alarm activated at end of session.    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

## 2023-07-25 NOTE — Plan of Care (Signed)
  Problem: Consults Goal: RH GENERAL PATIENT EDUCATION Description: See Patient Education module for education specifics. Outcome: Progressing   Problem: RH BOWEL ELIMINATION Goal: RH STG MANAGE BOWEL WITH ASSISTANCE Description: STG Manage Bowel with MOD I Assistance. Outcome: Progressing Goal: RH STG MANAGE BOWEL W/MEDICATION W/ASSISTANCE Description: STG Manage Bowel with Medication with MOD I Assistance. Outcome: Progressing   Problem: RH SAFETY Goal: RH STG ADHERE TO SAFETY PRECAUTIONS W/ASSISTANCE/DEVICE Description: STG Adhere to Safety Precautions With cueing Outcome: Progressing   Problem: RH PAIN MANAGEMENT Goal: RH STG PAIN MANAGED AT OR BELOW PT'S PAIN GOAL Description: < 4 with PRN Outcome: Progressing   Problem: RH KNOWLEDGE DEFICIT GENERAL Goal: RH STG INCREASE KNOWLEDGE OF SELF CARE AFTER HOSPITALIZATION Description: Patient and wife will be able to manage care independently with educational resources provided Outcome: Progressing   Problem: RH KNOWLEDGE DEFICIT GENERAL Goal: RH STG INCREASE KNOWLEDGE OF SELF CARE AFTER HOSPITALIZATION Description: Patient and wife will be able to manage care independently with educational resources provided Outcome: Progressing   Problem: RH KNOWLEDGE DEFICIT GENERAL Goal: RH STG INCREASE KNOWLEDGE OF SELF CARE AFTER HOSPITALIZATION Description: Patient and wife will be able to manage care independently with educational resources provided Outcome: Progressing

## 2023-07-25 NOTE — Plan of Care (Signed)
  Problem: Consults Goal: RH GENERAL PATIENT EDUCATION Description: See Patient Education module for education specifics. Outcome: Progressing   Problem: RH SAFETY Goal: RH STG ADHERE TO SAFETY PRECAUTIONS W/ASSISTANCE/DEVICE Description: STG Adhere to Safety Precautions With cueing Outcome: Progressing

## 2023-07-25 NOTE — Progress Notes (Signed)
Occupational Therapy Weekly Progress Note  Patient Details  Name: Shawn [name] MRN: [id number] Date of Birth: 03/14/1961  Beginning of progress report period: July 19, 2023 End of progress report period: August 01, 2023  Today's Date: 07/25/2023 OT Individual Time: 4098-1191 OT Individual Time Calculation (min): 70 min    Patient has met 3 of 3 short term goals. Excellent progress in all areas. Pt will benefit from [facility] with wife and continued daily tx to improve deficit areas. Requires continued CIR to maximize overall safety and strength for home d/c with wife.   Patient continues to demonstrate the following deficits: muscle weakness and muscle joint tightness, decreased cardiorespiratoy endurance, unbalanced muscle activation, ataxia, decreased coordination, and decreased motor planning, decreased visual acuity, and decreased sitting balance, decreased standing balance, decreased postural control, and decreased balance strategies and therefore will continue to benefit from skilled OT intervention to enhance overall performance with BADL, iADL, and Reduce care partner burden.  Patient progressing toward long term goals..  Continue plan of care.  OT Short Term Goals Week 1:  OT Short Term Goal 1 (Week 1): Pt will self feed with set up, low vision strategies and built up utensils as needed OT Short Term Goal 1 - Progress (Week 1): Met OT Short Term Goal 2 (Week 1): Pt will stand sink side for 1 min during LB dressing or sink side activity with CGA OT Short Term Goal 2 - Progress (Week 1): Met OT Short Term Goal 3 (Week 1): Pt will don upper body clothing with min A OT Short Term Goal 3 - Progress (Week 1): Met Week 2:  OT Short Term Goal 1 (Week 2): STG=LTG's based on LOS  Skilled Therapeutic Interventions/Progress Updates:   Pt seen for weekly reassessment and treatment for skilled OT services. Pt recliner level relaxing and open for session. Pt able to amb with RW  with CGA and min cues for directionality due to blindness to and from sink side regular arm chair. Seated sponge bathing with set up and min cues for figure 4 technique for spinal prec to reach to feet and lower legs to bathe and dress pull on pants and tennis shoes with Velcro. OT adjusted Aspen and replaced collar pads with clean ones after light sponge bath to neck and facial region. Pt able to complete UB self care with set up and close S. Oral care standing level with cues to not over bend and use trough with CGA for balance. Pt then able to complete full bin of varying resistance clothes pins around bin with increased time for severe muscle atrophy in B hands. Pt mb to bed with CGA and cues and able to position self with bed features and pillows. Completed 2 lb biceps curls and forearm flips 10 reps x 3 sets Bly. Left with all needs, nurse call button and bed exit engaged.    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

## 2023-07-25 NOTE — Progress Notes (Signed)
PROGRESS NOTE   Subjective/Complaints:  No acute complaints.  No events overnight.  Blood pressure stable/improved. Patient reports incontinent bowel movement overnight, none reported per chart.  ROS:  Denies fevers, chills, N/V, abdominal pain, diarrhea, SOB, cough, chest pain, new weakness or paraesthesias.    Objective:   No results found. Recent Labs    07/24/23 0528  WBC 5.9  HGB 8.6*  HCT 27.5*  PLT 389    Recent Labs    07/24/23 0528  NA 135  K 4.0  CL 96*  CO2 30  GLUCOSE 93  BUN 13  CREATININE 5.57*  CALCIUM 8.5*     Intake/Output Summary (Last 24 hours) at 07/25/2023 2014 Last data filed at 07/25/2023 1856 Gross per 24 hour  Intake 1057 ml  Output --  Net 1057 ml     Pressure Injury 07/18/23 Sacrum Mid Stage 1 -  Intact skin with non-blanchable redness of a localized area usually over a bony prominence. (Active)  07/18/23 1630 (Assessed by Ivor Messier, LPN and Becky, RN)  Location: Sacrum  Location Orientation: Mid  Staging: Stage 1 -  Intact skin with non-blanchable redness of a localized area usually over a bony prominence.  Wound Description (Comments):   Present on Admission: Yes    Physical Exam: Vital Signs Blood pressure (!) 105/46, pulse 80, temperature 98.3 F (36.8 C), resp. rate 18, height 6' (1.829 m), weight 66.8 kg, SpO2 100%.  Constitutional: No apparent distress. Appropriate appearance for age.  Reclining in bedside chair. HENT: Atraumatic, normocephalic. + Cervical collar--well-fitting. Eyes: Bilateral blindness-unchanged Cardiovascular: RRR, no murmurs/rub/gallops. No Edema. Peripheral pulses 2+  Respiratory: CTAB this morning, no coughing or coarseness. No rales, rhonchi, or wheezing. On RA.  Abdomen: + bowel sounds much more normoactive.  Soft, nontender.  No distention.  Skin:  + PCDF site - steri strips intact, moderate serosanguinous drainage, no fluctuance or  expressible discharge.  Overlying clean, dry gauze. + Anterior surgical site with Steri-Strips intact, no active drainage, clean and dry.  MSK:      No apparent deformity.  Difficulty with bilateral shoulder abduction >110 degrees due to pain--improving    Neurologic exam:  AAOx3.  Follows all simple commands.  No apparent cognitive deficits.  Cranial nerves intact.  Bilateral stocking glove pattern sensory neuropathy. Strength 4- out of 5 in proximal shoulders, otherwise 5- out of 5 throughout.  No tone, spasticity, or ataxia.  Good insight into current deficits  -Unchanged 1-16       Assessment/Plan: 1. Functional deficits which require 3+ hours per day of interdisciplinary therapy in a comprehensive inpatient rehab setting. Physiatrist is providing close team supervision and 24 hour management of active medical problems listed below. Physiatrist and rehab team continue to assess barriers to discharge/monitor patient progress toward functional and medical goals  Care Tool:  Bathing    Body parts bathed by patient: Face, Chest, Abdomen   Body parts bathed by helper: Right arm, Left arm, Front perineal area, Right lower leg, Left upper leg, Right upper leg, Buttocks, Left lower leg     Bathing assist Assist Level: Maximal Assistance - Patient 24 - 49%     Upper Body Dressing/Undressing  Upper body dressing   What is the patient wearing?: Hospital gown only    Upper body assist Assist Level: Maximal Assistance - Patient 25 - 49%    Lower Body Dressing/Undressing Lower body dressing      What is the patient wearing?: Incontinence brief     Lower body assist Assist for lower body dressing: Total Assistance - Patient < 25%     Toileting Toileting    Toileting assist Assist for toileting: Maximal Assistance - Patient 25 - 49%     Transfers Chair/bed transfer  Transfers assist     Chair/bed transfer assist level: Minimal Assistance - Patient > 75%      Locomotion Ambulation   Ambulation assist      Assist level: Minimal Assistance - Patient > 75% Assistive device: Walker-rolling Max distance: 100'   Walk 10 feet activity   Assist     Assist level: Minimal Assistance - Patient > 75% Assistive device: Walker-rolling   Walk 50 feet activity   Assist Walk 50 feet with 2 turns activity did not occur: Safety/medical concerns  Assist level: Minimal Assistance - Patient > 75% Assistive device: Walker-rolling    Walk 150 feet activity   Assist Walk 150 feet activity did not occur: Safety/medical concerns         Walk 10 feet on uneven surface  activity   Assist Walk 10 feet on uneven surfaces activity did not occur: Safety/medical concerns         Wheelchair     Assist Is the patient using a wheelchair?: Yes Type of Wheelchair: Manual    Wheelchair assist level: Dependent - Patient 0%      Wheelchair 50 feet with 2 turns activity    Assist        Assist Level: Dependent - Patient 0%   Wheelchair 150 feet activity     Assist      Assist Level: Dependent - Patient 0%  Plan: Blood pressure (!) 105/46, pulse 80, temperature 98.3 F (36.8 C), resp. rate 18, height 6' (1.829 m), weight 66.8 kg, SpO2 100%. Medical Problem List and Plan: 1. 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 Per nursing no SLP needs.    2.  Antithrombotics: -DVT/anticoagulation:  Pharmaceutical: Heparin 5000U q8h             -antiplatelet therapy: continue aspirin 325 mg daily   3. Pain Management:  -Tylenol as needed -Norco as needed 5-10 mg q4 hours- might need Oxy if not enough- was taking IV Dilaudid  solely on Acute.              -Flexeril 10 mg TID as needed   -Pain well-controlled on current regimen, continue.  -1-14: Minimal use of PRNs.  Change Tylenol to 1000 mg 3 times daily as needed, reduce Norco to 5 mg every 6 hours as needed only for moderate to severe pain.  1-16: Rare use of hydrocodone, reduced to 2.5 to 5 mg twice daily as needed and DC at discharge.  4. Mood/Behavior/Sleep: LCSW to evaluate and provide emotional support             -  continue melatonin 5 mg q HS             -antipsychotic agents: n/a   - Sleeping well   5. Neuropsych/cognition: This patient is capable of making decisions on his own behalf.   6. Skin/Wound Care: Routine skin care checks             -monitor surgical incisions  -1-14: Has been 1 week since postop, may remove PCDF honeycomb dressing.  Due to drainage, will have nursing take photo of this with dressing change. -- moderate serosanguinous drainage  1-16: Stable appearance, no further drainage on gauze, healing well   7. Fluids/Electrolytes/Nutrition: Strict Is and Os and follow-up chemistries per nephrology    - Admission labs with stable hyponatremia.  Follow q. weekly.--Stable, unchanged  8: Hypertension: monitor TID and prn -continue amlodipine 5 mg daily>> appears it was d/c'd on admission? Changed to carvedilol 3.125mg  BID             -continue lisinopril 20 mg BID  - 1-10: Mild blood pressure elevations today.  Monitor for trend, continue increase in amlodipine. -07/20/23 BPs ok, monitor for now, appears amlodipine was changed to carvedilol during admission-- no med adjustments today -07/21/23 BPs a little variable, monitor trend today with dialysis, may need adjustments tomorrow 1/14: add back norvasc 5 mg dail, add Prn hydralazine 25 mg for SBP >180, DBP >110 1/15-16: Improving with norvasc; continue Vitals:   07/24/23 1530 07/24/23 1600 07/24/23 1630 07/24/23 1659  BP: (!) 143/73 120/71 132/72 123/70   07/24/23 1701 07/24/23 1800  07/24/23 2023 07/25/23 0438  BP: (!) 141/70 131/61 (!) 140/62 (!) 123/57   07/25/23 0804 07/25/23 0805 07/25/23 1247 07/25/23 1958  BP: (!) 142/70 (!) 142/71 (!) 141/65 (!) 105/46     9: Hyperlipidemia: continue atorvastatin 80mg  daily   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, Cosopt, Xalantan gtts   11: ESRD: HD>>M,W,F via right internal jugular TDC             -continue calcitrol 0.5 mcg M,W,F             -continue Aranesp 60 mcg every Wednesday             -continue Fosrenol 1,000 mg TID with meals   12: DM-2: A1c = 5.8%; at home on Tresiba Flextouch 12u at bedtime             -continue SSI             -continue Semglee 2 units q HS    - Well-controlled on current regimen Recent Labs    07/25/23 0626 07/25/23 1213 07/25/23 1625  GLUCAP 133* 78 122*     13: Constipation: continue MiraLax daily, Senokot-S BID; prns ordered-resolved  - 1-10: Positive results with enema overnight.  Increase Miralax to 17 g twice daily and Senokot 2S to 2 tabs twice daily.  Add docusate suppository for moderate constipation and enema for severe constipation.  No signs concerning for obstruction. -07/20/23 only small BMs recently, wants to try another soap suds enema, will do that to see if we decrease stool burden.  -07/21/23 enema yesterday helped significantly, cont meds for now, if oversoftened then would back off stool regimen.  1-13: Start bowel program as below.  Stools remain liquid, so we will DC daily MiraLAX and have Senokot as 2 hours before bowel program at night. - see #17  14: Discitis/osteomyelitis with Staph epidermidis bacteremia:  -continue vancomycin 750 mg with HD through 08/26/2023- will likely  change to Chronic Doxycycline after that, but need to reach out to ID when close to d/c to verify -1/14: Seems to have missed dosed with Monday's dialysis, got additional dose today per pharmacy.   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 Dutch Quint 1-13, may remove for bathing and changing collar only.             -follow-up with Dr. Jordan Likes   16: History of prior CVA: on aspirin and statin   17. Neurogenic bowel- resolved If doesn't work, will need soap suds enema, etc- might need KUB -See #13; had positive bowel movement with enema overnight, increasing bowel regimen and may need daily bowel program if no results in next 1 to 2 days - 1/13: 2x enema with BM; discussed with patient as above, will start nightly bowel program at 8 PM with Magic bullet suppository and digital stim with no results.  1-14.  Refused bowel program overnight.  Educated patient on importance of consistency with this, he is agreeable.  1/15: Patient now having daily, continent bowel movements. All bowel meds moved to PRN.   1-16.  Patient thinks he had incontinent bowel movement overnight, not recorded per nursing, will continue to monitor and may resume bowel program if further incontinent episodes.  Of note, patient states that he did not refuse suppository, claims he was never offered it.  LOS: 7 days A FACE TO FACE EVALUATION WAS PERFORMED  Angelina Sheriff 07/25/2023, 8:14 PM

## 2023-07-25 NOTE — Progress Notes (Addendum)
Patient ID: Shawn [name], male   DOB: 10/29/1960, 63 y.o.   MRN: [id number]  [facility] accepted referral for HHPT/OT.  1541-SW spoke with pt wife to provide updates from team conference, and d/c date. She was aware of d/c date. Scheduled fam edu on Monday (1/20) 10am-12pm. She was informed on HHA now in place.   Cecile Sheerer, MSW, LCSW Office: [phone] Cell: [phone] Fax: [phone]

## 2023-07-26 LAB — CBC
HCT: 25.5 % — ABNORMAL LOW (ref 39.0–52.0)
Hemoglobin: 7.9 g/dL — ABNORMAL LOW (ref 13.0–17.0)
MCH: 26.7 pg (ref 26.0–34.0)
MCHC: 31 g/dL (ref 30.0–36.0)
MCV: 86.1 fL (ref 80.0–100.0)
Platelets: 357 10*3/uL (ref 150–400)
RBC: 2.96 MIL/uL — ABNORMAL LOW (ref 4.22–5.81)
RDW: 16.7 % — ABNORMAL HIGH (ref 11.5–15.5)
WBC: 5.7 10*3/uL (ref 4.0–10.5)
nRBC: 0 % (ref 0.0–0.2)

## 2023-07-26 LAB — GLUCOSE, CAPILLARY
Glucose-Capillary: 119 mg/dL — ABNORMAL HIGH (ref 70–99)
Glucose-Capillary: 91 mg/dL (ref 70–99)
Glucose-Capillary: 146 mg/dL — ABNORMAL HIGH (ref 70–99)

## 2023-07-26 LAB — RENAL FUNCTION PANEL
Albumin: 2.7 g/dL — ABNORMAL LOW (ref 3.5–5.0)
Anion gap: 10 (ref 5–15)
BUN: 18 mg/dL (ref 8–23)
CO2: 26 mmol/L (ref 22–32)
Calcium: 8.6 mg/dL — ABNORMAL LOW (ref 8.9–10.3)
Chloride: 92 mmol/L — ABNORMAL LOW (ref 98–111)
Creatinine, Ser: 6.31 mg/dL — ABNORMAL HIGH (ref 0.61–1.24)
GFR, Estimated: 9 mL/min — ABNORMAL LOW (ref >60)
Glucose, Bld: 142 mg/dL — ABNORMAL HIGH (ref 70–99)
Phosphorus: 3.6 mg/dL (ref 2.5–4.6)
Potassium: 4.2 mmol/L (ref 3.5–5.1)
Sodium: 128 mmol/L — ABNORMAL LOW (ref 135–145)

## 2023-07-26 MED ORDER — HEPARIN SODIUM (PORCINE) 1000 UNIT/ML DIALYSIS
6500.0000 [IU] | INTRAMUSCULAR | Status: DC | PRN
  Administered 2023-07-26: 6500 [IU] via INTRAVENOUS_CENTRAL
  Filled 2023-07-26: qty 7

## 2023-07-26 MED ORDER — LIDOCAINE-PRILOCAINE 2.5-2.5 % EX CREA: 1.0000 | TOPICAL_CREAM | CUTANEOUS | Status: DC | PRN

## 2023-07-26 MED ORDER — HEPARIN SODIUM (PORCINE) 1000 UNIT/ML DIALYSIS: 1000.0000 [IU] | INTRAMUSCULAR | Status: DC | PRN

## 2023-07-26 MED ORDER — PENTAFLUOROPROP-TETRAFLUOROETH EX AERO: 1.0000 | INHALATION_SPRAY | CUTANEOUS | Status: DC | PRN

## 2023-07-26 MED ORDER — ANTICOAGULANT SODIUM CITRATE 4% (200MG/5ML) IV SOLN: 5.0000 mL | Status: DC | PRN

## 2023-07-26 MED ORDER — SENNOSIDES-DOCUSATE SODIUM 8.6-50 MG PO TABS
2.0000 | ORAL_TABLET | Freq: Two times a day (BID) | ORAL | Status: DC
Start: 2023-07-26 — End: 2023-08-04
  Administered 2023-07-26 – 2023-08-04 (×8): 2 via ORAL
  Filled 2023-07-26 (×17): qty 2

## 2023-07-26 MED ORDER — ALTEPLASE 2 MG IJ SOLR: 2.0000 mg | Freq: Once | INTRAMUSCULAR | Status: DC | PRN

## 2023-07-26 MED ORDER — LIDOCAINE HCL (PF) 1 % IJ SOLN: 5.0000 mL | INTRAMUSCULAR | Status: DC | PRN

## 2023-07-26 NOTE — Progress Notes (Signed)
Physical Therapy Weekly Progress Note  Patient Details  Name: Shawn [name] MRN: [id number] Date of Birth: 06-Jun-1961  Beginning of progress report period: July 19, 2023 End of progress report period: July 26, 2023  Today's Date: 07/26/2023 PT Individual Time: 0850-0908 PT Individual Time Calculation (min): 18 min   Patient has met 2 of 4 short term goals.  Pt is making good progress towards functional goals. Pt has been limited due to dialysis scheduling and fatigue. Pt currently requires supervision for bed mobility, min assist for transfers with and without device and CGA/min assist for ambulation up to 175' with RW. Pt has begun trialing ambulation with SPC as is his baseline, requires min assist and able to ambulate up to 50'. Pt will benefit from family training prior to DC.  Patient continues to demonstrate the following deficits muscle weakness, decreased cardiorespiratoy endurance, low vision,  , and decreased standing balance, decreased postural control, and decreased balance strategies and therefore will continue to benefit from skilled PT intervention to increase functional independence with mobility.  Patient progressing toward long term goals..  Continue plan of care.  PT Short Term Goals Week 1:  PT Short Term Goals - Week 1 PT Short Term Goal 1 (Week 1): Pt will complete bed mobility with supervision PT Short Term Goal 1 - Progress (Week 1): Met PT Short Term Goal 2 (Week 1): Pt will complete transfers with CGA with LRAD PT Short Term Goal 2 - Progress (Week 1): Progressing toward goal PT Short Term Goal 3 (Week 1): Pt will ambulate 100' with RW with CGA PT Short Term Goal 3 - Progress (Week 1): Progressing toward goal PT Short Term Goal 4 (Week 1): Pt will tolerate OOB for 2 hours outside of therapies PT Short Term Goal 4 - Progress (Week 1): Met PT Short Term Goals - Week 2 PT Short Term Goal 1 (Week 2): Pt will ambulate 150' with LRAD CGA PT Short Term  Goal 2 (Week 2): Pt willl complete transfer with RW CGA PT Short Term Goal 3 (Week 2): Pt will maintain dynamic standing balance during functional task with supervision   Skilled Therapeutic Interventions/Progress Updates:  Pt presents in room in bed, agreeable to PT but concerned that dialysis would miss him. Pt denies pain at this time. Pt completes bed mobility supine to sit EOB with supervision from flat bed, does not require hospital bed features. Pt completes stand step transfer without device min assist to WC. Pt transported to main gym for time management. Pt reporting asking wife to bring his hurrycane as he states he feels more stable with it. Pt agreeable to trial quad cane to improve stability, pt completes sit>stand with CGA to [facility] and ambulates 20' with CGA demonstrating significant improvement in postural stability however pt reporting feeling off balance and requesting to return to gait training with SPC. At this point pt RN calls to state dialysis has arrived early, pt transported back to room and completes stand step transfer without device min assist. Pt completes sit to supine with supervision and remains in supine at end of session. Pt missing 42 minutes out of 60 minutes due to dialysis schedule.  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

## 2023-07-26 NOTE — Progress Notes (Signed)
Occupational Therapy Session Note  Patient Details  Name: Shawn [name] MRN: [id number] Date of Birth: June 03, 1961  Today's Date: 07/26/2023 OT Individual Time: 0865-7846 OT Individual Time Calculation (min): 71 min    Short Term Goals: Week 2:  OT Short Term Goal 1 (Week 2): STG=LTG's based on LOS  Skilled Therapeutic Interventions/Progress Updates:    Pt received supine with no c/o pain, agreeable to OT session. He came to EOB with bed rail use, min A. C-collar on. He completed sit > stand to doff pants with min A. He wanted to don his personal underwear over the incontinence brief to keep it up better. He required mod A to thread over LE and then was able to pull up in standing. Ambulatory transfer to the w/c with mod cueing for directionality and min A for balance with posterior bias. Oral care seated at the sink with set up assist. Pt was taken via w/c to the therapy gym for time management. He stood on an Airex pad to challenge ankle and hip righting reactions for reduced fall risk and improving dynamic balance. He completed 3x10 mini squats and then good morning to promote increased hip flexion/mobility. He required up to mod A when removing UE from the RW and as little as min A with UE support. He demonstrates fear of falling and limited pelvic mobility in standing, both limiting dynamic balance. He required extended rest breaks between sets 2/2 fatigue. He completed 150 ft of functional mobility with min A overall, requiring guidance from OT 2/2 vision to challenge functional activity tolerance. After a rest break he completed another 100 ft to return to his room, similar assist. Pt was left supine with all needs met. Bed alarm set.   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

## 2023-07-26 NOTE — Procedures (Signed)
I was present at this dialysis session. I have reviewed the session itself and made appropriate changes.   3K bath, UF goal of 2L.  Pt tolerating well.  No c/o.  Says estimated DC is 1/28  [facility] Weights   07/24/23 1400 07/24/23 1709 07/26/23 0920  Weight: 68.9 kg 66.8 kg 63.9 kg    Recent Labs  Lab 07/24/23 0528  NA 135  K 4.0  CL 96*  CO2 30  GLUCOSE 93  BUN 13  CREATININE 5.57*  CALCIUM 8.5*  PHOS 3.9    Recent Labs  Lab 07/19/23 1439 07/24/23 0528  WBC 10.1 5.9  HGB 8.4* 8.6*  HCT 26.4* 27.5*  MCV 85.2 84.4  PLT 309 389    Scheduled Meds:  (feeding supplement) PROSource Plus  30 mL Oral BID BM   amLODipine  5 mg Oral Daily   aspirin EC  325 mg Oral QHS   atorvastatin  80 mg Oral Daily   brimonidine  1 drop Both Eyes BID   calcitRIOL  0.5 mcg Oral Q M,W,F   carvedilol  3.125 mg Oral BID WC   Chlorhexidine Gluconate Cloth  6 each Topical Q12H   darbepoetin (ARANESP) injection - DIALYSIS  60 mcg Subcutaneous Q Wed-1800   dorzolamide-timolol  1 drop Both Eyes BID   heparin  5,000 Units Subcutaneous Q8H   insulin aspart  0-9 Units Subcutaneous TID WC   insulin glargine-yfgn  2 Units Subcutaneous QHS   lanthanum  1,000 mg Oral TID WC   latanoprost  1 drop Both Eyes QHS   lisinopril  20 mg Oral BID   melatonin  5 mg Oral QHS   senna-docusate  2 tablet Oral QPC supper   Continuous Infusions:  anticoagulant sodium citrate     heparin sodium (porcine)     vancomycin (VANCOCIN) 750 mg in sodium chloride 0.9 % 250 mL IVPB Stopped (07/24/23 1750)   PRN Meds:.acetaminophen **OR** [DISCONTINUED] acetaminophen, alteplase, anticoagulant sodium citrate, bisacodyl, cyclobenzaprine, diphenhydrAMINE, docusate sodium, guaiFENesin-dextromethorphan, heparin, heparin, heparin sodium (porcine), hydrALAZINE, HYDROcodone-acetaminophen, lidocaine (PF), lidocaine-prilocaine, ondansetron **OR** ondansetron (ZOFRAN) IV, pentafluoroprop-tetrafluoroeth, polyethylene glycol   Sabra Heck   MD 07/26/2023, 9:44 AM

## 2023-07-26 NOTE — Progress Notes (Signed)
Physical Therapy Session Note  Patient Details  Name: Shawn [name] MRN: [id number] Date of Birth: 1961/05/28  Today's Date: 07/26/2023 PT Individual Time: 9562-1308 PT Individual Time Calculation (min): 53 min   Short Term Goals: Week 2:  PT Short Term Goal 1 (Week 2): Pt will ambulate 150' with LRAD CGA PT Short Term Goal 2 (Week 2): Pt willl complete transfer with RW CGA PT Short Term Goal 3 (Week 2): Pt will maintain dynamic standing balance during functional task with supervision  Skilled Therapeutic Interventions/Progress Updates:  Pt presents in room in bed, having just returned from dialysis, agreeable to PT session to make up missed time. Pt denies pain at this time. Session focused on gait training and therapeutic exercise for BUE/BLE strengthening and global endurance.  Pt completes bed mobility with supervision. Pt completes stand step transfer with CGA bed to WC with hurrycane. Pt transported to day room dependently for time management. Pt completes sit<>stand with CGA with hurrycane, ambulates with CGA/min assist 20', takes seated rest break. Pt then prompted to ambulate self selected distance (turn around when ready) with pt ambulating 150' CGA/min assist initially with cues for direction, requires min/mod assist with fatigue towards end of gait trial. During rest break therapist communicates with nursing staff to provide lunch tray as pt had not received tray due to dialysis. Pt then completes stand step transfer with SPC to nustep.  Pt completes continuous training on nustep BUE/BLE 5 min and BLE only for 5 min for total 10 min, workload 3, pt maintaining 30-35 SPM throughout activity improved from previous session. Completed to promote BLE strengthening and global endurance.  Pt completes stand step transfer back to [facility] and transported back to room. Pt completes stand step transfer back to bed with min assist with SPC cues for direction. Pt remains seated EOB, pt able to  change shirt seated EOB with supervision, set up assist due to low vision. Pt then completes sit to supine with supervision and remains supine with all needs within reach, call light in place, and bed alarm activated at end of session.  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

## 2023-07-26 NOTE — Progress Notes (Signed)
Physical Therapy Session Note  Patient Details  Name: Shawn [name] MRN: [id number] Date of Birth: [birth year]  Today's Date: 07/26/2023 PT Missed Time: 60 Minutes Missed Time Reason: Out of hospital appointment (off unit for dialysis)  Pt off unit for dialysis during scheduled session this AM, will reattempt as schedule allows.  Edwin Cap PT, DPT 07/26/2023, 1:01 PM

## 2023-07-26 NOTE — Progress Notes (Signed)
PROGRESS NOTE   Subjective/Complaints:  No acute complaints.  No events overnight.  Blood pressure much improved, now only slightly hypertensive to normotensive No bowel movements overnight; is passing gas  ROS:  Denies fevers, chills, N/V, abdominal pain, diarrhea, SOB, cough, chest pain, new weakness or paraesthesias.    Objective:   No results found. Recent Labs    07/24/23 0528  WBC 5.9  HGB 8.6*  HCT 27.5*  PLT 389    Recent Labs    07/24/23 0528  NA 135  K 4.0  CL 96*  CO2 30  GLUCOSE 93  BUN 13  CREATININE 5.57*  CALCIUM 8.5*     Intake/Output Summary (Last 24 hours) at 07/26/2023 0818 Last data filed at 07/26/2023 0744 Gross per 24 hour  Intake 1057 ml  Output --  Net 1057 ml     Pressure Injury 07/18/23 Sacrum Mid Stage 1 -  Intact skin with non-blanchable redness of a localized area usually over a bony prominence. (Active)  07/18/23 1630 (Assessed by Ivor Messier, LPN and Becky, RN)  Location: Sacrum  Location Orientation: Mid  Staging: Stage 1 -  Intact skin with non-blanchable redness of a localized area usually over a bony prominence.  Wound Description (Comments):   Present on Admission: Yes    Physical Exam: Vital Signs Blood pressure 139/63, pulse 74, temperature 98.1 F (36.7 C), resp. rate 17, height 6' (1.829 m), weight 66.8 kg, SpO2 100%.  Constitutional: No apparent distress. Appropriate appearance for age. Sitting in WC.  HENT: Atraumatic, normocephalic. + Cervical collar--well-fitting. Eyes: Bilateral blindness-unchanged Cardiovascular: RRR, no murmurs/rub/gallops. No Edema. Peripheral pulses 2+  Respiratory: CTAB this morning, no coughing or coarseness. No rales, rhonchi, or wheezing. On RA.  Abdomen: + bowel sounds much more normoactive.  Soft, nontender.  No distention.  Skin:  + PCDF site - steri strips intact, moderate serosanguinous drainage, no fluctuance or expressible  discharge.  Overlying clean, dry gauze.--unchanged 1/17 + Anterior surgical site with Steri-Strips intact, no active drainage, clean and dry.  MSK:      No apparent deformity.  Difficulty with bilateral shoulder abduction >110 degrees due to pain--improving    Neurologic exam:  AAOx3.  Follows all simple commands.  No apparent cognitive deficits.  Cranial nerves intact.  Bilateral stocking glove pattern sensory neuropathy. Strength 4 out of 5 in proximal shoulders, otherwise 5- out of 5 throughout.  No tone, spasticity, or ataxia.  Good insight into current deficits  -Unchanged 1-17       Assessment/Plan: 1. Functional deficits which require 3+ hours per day of interdisciplinary therapy in a comprehensive inpatient rehab setting. Physiatrist is providing close team supervision and 24 hour management of active medical problems listed below. Physiatrist and rehab team continue to assess barriers to discharge/monitor patient progress toward functional and medical goals  Care Tool:  Bathing    Body parts bathed by patient: Face, Chest, Abdomen   Body parts bathed by helper: Right arm, Left arm, Front perineal area, Right lower leg, Left upper leg, Right upper leg, Buttocks, Left lower leg     Bathing assist Assist Level: Maximal Assistance - Patient 24 - 49%  Upper Body Dressing/Undressing Upper body dressing   What is the patient wearing?: Hospital gown only    Upper body assist Assist Level: Maximal Assistance - Patient 25 - 49%    Lower Body Dressing/Undressing Lower body dressing      What is the patient wearing?: Incontinence brief     Lower body assist Assist for lower body dressing: Total Assistance - Patient < 25%     Toileting Toileting    Toileting assist Assist for toileting: Maximal Assistance - Patient 25 - 49%     Transfers Chair/bed transfer  Transfers assist     Chair/bed transfer assist level: Minimal Assistance - Patient > 75%      Locomotion Ambulation   Ambulation assist      Assist level: Minimal Assistance - Patient > 75% Assistive device: Walker-rolling Max distance: 100'   Walk 10 feet activity   Assist     Assist level: Minimal Assistance - Patient > 75% Assistive device: Walker-rolling   Walk 50 feet activity   Assist Walk 50 feet with 2 turns activity did not occur: Safety/medical concerns  Assist level: Minimal Assistance - Patient > 75% Assistive device: Walker-rolling    Walk 150 feet activity   Assist Walk 150 feet activity did not occur: Safety/medical concerns         Walk 10 feet on uneven surface  activity   Assist Walk 10 feet on uneven surfaces activity did not occur: Safety/medical concerns         Wheelchair     Assist Is the patient using a wheelchair?: Yes Type of Wheelchair: Manual    Wheelchair assist level: Dependent - Patient 0%      Wheelchair 50 feet with 2 turns activity    Assist        Assist Level: Dependent - Patient 0%   Wheelchair 150 feet activity     Assist      Assist Level: Dependent - Patient 0%  Plan: Blood pressure 139/63, pulse 74, temperature 98.1 F (36.7 C), resp. rate 17, height 6' (1.829 m), weight 66.8 kg, SpO2 100%. Medical Problem List and Plan: 1. 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 Per nursing no SLP needs.    2.  Antithrombotics: -DVT/anticoagulation:  Pharmaceutical: Heparin 5000U q8h             -antiplatelet therapy: continue aspirin 325 mg daily   3. Pain Management:  -Tylenol as needed -Norco as needed 5-10 mg q4 hours- might need Oxy if not enough- was taking IV Dilaudid solely  on Acute.              -Flexeril 10 mg TID as needed   -Pain well-controlled on current regimen, continue.  -1-14: Minimal use of PRNs.  Change Tylenol to 1000 mg 3 times daily as needed, reduce Norco to 5 mg every 6 hours as needed only for moderate to severe pain.  1-16: Rare use of hydrocodone, reduced to 2.5 to 5 mg twice daily as needed and DC at discharge.  4. Mood/Behavior/Sleep: LCSW to evaluate and provide emotional support             -  continue melatonin 5 mg q HS             -antipsychotic agents: n/a   - Sleeping well   5. Neuropsych/cognition: This patient is capable of making decisions on his own behalf.   6. Skin/Wound Care: Routine skin care checks             -monitor surgical incisions  -1-14: Has been 1 week since postop, may remove PCDF honeycomb dressing.  Due to drainage, will have nursing take photo of this with dressing change. -- moderate serosanguinous drainage  07-25-15: Stable appearance, no further drainage on gauze, healing well   7. Fluids/Electrolytes/Nutrition: Strict Is and Os and follow-up chemistries per nephrology    - Admission labs with stable hyponatremia.  Follow q. weekly.--Stable, unchanged  8: Hypertension: monitor TID and prn -continue amlodipine 5 mg daily>> appears it was d/c'd on admission? Changed to carvedilol 3.125mg  BID             -continue lisinopril 20 mg BID  - 1-10: Mild blood pressure elevations today.  Monitor for trend, continue increase in amlodipine. -07/20/23 BPs ok, monitor for now, appears amlodipine was changed to carvedilol during admission-- no med adjustments today -07/21/23 BPs a little variable, monitor trend today with dialysis, may need adjustments tomorrow 1/14: add back norvasc 5 mg dail, add Prn hydralazine 25 mg for SBP >180, DBP >110 1/15-16: Improving with norvasc; continue 1/17: Diastolic a little low, systolic much improved since starting Norvasc.  Monitor and can consider reduction to 2.5 mg daily if  symptomatic.  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, Cosopt, Xalantan gtts   11: ESRD: HD>>M,W,F via right internal jugular TDC             -continue calcitrol 0.5 mcg M,W,F             -continue Aranesp 60 mcg every Wednesday             -continue Fosrenol 1,000 mg TID with meals   12: DM-2: A1c = 5.8%; at home on Tresiba Flextouch 12u at bedtime             -continue SSI             -continue Semglee 2 units q HS    - Well-controlled on current regimen Recent Labs    07/25/23 1625 07/25/23 2109 07/26/23 0628  GLUCAP 122* 109* 119*     13: Constipation: continue MiraLax daily, Senokot-S BID; prns ordered-resolved  - 1-10: Positive results with enema overnight.  Increase Miralax to 17 g twice daily and Senokot 2S to 2 tabs twice daily.  Add docusate suppository for moderate constipation and enema for severe constipation.  No signs concerning for obstruction. -07/20/23 only small BMs recently, wants to try another soap suds enema, will do that to see if we decrease stool burden.  -07/21/23 enema yesterday helped significantly, cont meds for now, if oversoftened then would back off stool regimen.  1-13: Start bowel program as below.  Stools remain liquid, so we will DC daily MiraLAX and have Senokot as 2 hours before bowel program  at night. - see #17  14: Discitis/osteomyelitis with Staph epidermidis bacteremia:  -continue vancomycin 750 mg with HD through 08/26/2023- will likely change to Chronic Doxycycline after that, but need to reach out to ID when close to d/c to verify -1/14: Seems to have missed dosed with  Monday's dialysis, got additional dose today per pharmacy.   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 Dutch Quint 1-13, may remove for bathing and changing collar only.             -follow-up with Dr. Jordan Likes   16: History of prior CVA: on aspirin and statin   17. Neurogenic bowel- resolved If doesn't work, will need soap suds enema, etc- might need KUB -See #13; had positive bowel movement with enema overnight, increasing bowel regimen and may need daily bowel program if no results in next 1 to 2 days - 1/13: 2x enema with BM; discussed with patient as above, will start nightly bowel program at 8 PM with Magic bullet suppository and digital stim with no results.  1-14.  Refused bowel program overnight.  Educated patient on importance of consistency with this, he is agreeable.  1/15: Patient now having daily, continent bowel movements. All bowel meds moved to PRN.   1-16.  Patient thinks he had incontinent bowel movement overnight, not recorded per nursing, will continue to monitor and may resume bowel program if further incontinent episodes.  Of note, patient states that he did not refuse suppository, claims he was never offered it.  1/15 LBM --increased sennakot S to 2 tabs BID LOS: 8 days A FACE TO FACE EVALUATION WAS PERFORMED  Angelina Sheriff 07/26/2023, 8:18 AM

## 2023-07-26 NOTE — Progress Notes (Signed)
Received patient in bed to unit.  Alert and oriented.  Informed consent signed and in chart.   TX duration:3 hours  Patient tolerated well.  Transported back to the room  Alert, without acute distress.  Hand-off given to patient's nurse.   Access used: Right internal jugular HD Cath Access issues: none  Total UF removed: 2L Medication(s) given: Vancomycin   07/26/23 1240  Vitals  Temp 98 F (36.7 C)  Temp Source Oral  BP 129/79  MAP (mmHg) 95  Pulse Rate 82  ECG Heart Rate 83  Resp 15  Oxygen Therapy  SpO2 100 %  O2 Device Room Air  During Treatment Monitoring  Duration of HD Treatment -hour(s) 3 hour(s)  HD Safety Checks Performed Yes  Intra-Hemodialysis Comments Tx completed;Tolerated well  Dialysis Fluid Bolus Normal Saline  Bolus Amount (mL) 300 mL  Post Treatment  Dialyzer Clearance Lightly streaked  Liters Processed 72  Fluid Removed (mL) 2000 mL  Tolerated HD Treatment Yes  Hemodialysis Catheter Right Internal jugular Double lumen Permanent (Tunneled)  Placement Date/Time: 07/12/23 1816   Placed prior to admission: No  Serial / Lot #: [id number]  Expiration Date: 12/07/27  Time Out: Correct patient;Correct procedure;Correct site  Maximum sterile barrier precautions: Hand hygiene;Cap;Mask;Sterile gown...  Site Condition No complications  Blue Lumen Status Flushed;Heparin locked;Dead end cap in place  Red Lumen Status Flushed;Dead end cap in place;Heparin locked  Purple Lumen Status N/A  Catheter fill solution Heparin 1000 units/ml  Catheter fill volume (Arterial) 1.9 cc  Catheter fill volume (Venous) 1.9  Dressing Type Transparent  Dressing Status Antimicrobial disc in place;Clean, Dry, Intact  Interventions Other (Comment) (deaccessed)  Drainage Description None  Dressing Change Due 08/02/23  Post treatment catheter status Capped and Clamped     Stacie Glaze LPN [facility]

## 2023-07-27 LAB — GLUCOSE, CAPILLARY
Glucose-Capillary: 134 mg/dL — ABNORMAL HIGH (ref 70–99)
Glucose-Capillary: 96 mg/dL (ref 70–99)
Glucose-Capillary: 107 mg/dL — ABNORMAL HIGH (ref 70–99)
Glucose-Capillary: 94 mg/dL (ref 70–99)

## 2023-07-27 MED ORDER — LANTHANUM CARBONATE 500 MG PO CHEW
500.0000 mg | CHEWABLE_TABLET | Freq: Three times a day (TID) | ORAL | Status: DC
  Administered 2023-07-27 – 2023-08-03 (×18): 500 mg via ORAL
  Filled 2023-07-27 (×23): qty 1

## 2023-07-27 NOTE — Progress Notes (Signed)
[facility] Progress Note   Subjective:   Dialysis yesterday - no issues 2L UF Asks if Fosrenol dose can be reduced to help with constipation No new events overnight. No cp/dyspnea  Objective Vitals:   07/26/23 1627 07/26/23 1937 07/27/23 0319 07/27/23 0512  BP: (!) 132/59 135/64 134/60   Pulse: 84 82 74   Resp:  18 18   Temp: 98.5 F (36.9 C) 98.5 F (36.9 C) 97.7 F (36.5 C)   TempSrc: Oral Oral Oral   SpO2: 100% 99% 100%   Weight:    61.5 kg  Height:       Physical Exam General: pleasant male in no acute distress Heart: RRR, 3/6 systolic murmur Lungs: CTA, no increased WOB, room air Abdomen: non-distended and non-tender,  Extremities: no UE or LE edema Dialysis Access: Right [facility]  Additional Objective Labs: Basic Metabolic Panel: Recent Labs  Lab 07/24/23 0528 07/26/23 0920  NA 135 128*  K 4.0 4.2  CL 96* 92*  CO2 30 26  GLUCOSE 93 142*  BUN 13 18  CREATININE 5.57* 6.31*  CALCIUM 8.5* 8.6*  PHOS 3.9 3.6   Liver Function Tests: Recent Labs  Lab 07/24/23 0528 07/26/23 0920  ALBUMIN 2.6* 2.7*    CBC: Recent Labs  Lab 07/24/23 0528 07/26/23 0920  WBC 5.9 5.7  HGB 8.6* 7.9*  HCT 27.5* 25.5*  MCV 84.4 86.1  PLT 389 357    CBG: Recent Labs  Lab 07/25/23 2109 07/26/23 0628 07/26/23 1620 07/26/23 2100 07/27/23 0604  GLUCAP 109* 119* 91 146* 134*    Medications:  heparin sodium (porcine)     vancomycin (VANCOCIN) 750 mg in sodium chloride 0.9 % 250 mL IVPB Stopped (07/26/23 1224)    (feeding supplement) PROSource Plus  30 mL Oral BID BM   amLODipine  5 mg Oral Daily   aspirin EC  325 mg Oral QHS   atorvastatin  80 mg Oral Daily   brimonidine  1 drop Both Eyes BID   calcitRIOL  0.5 mcg Oral Q M,W,F   carvedilol  3.125 mg Oral BID WC   Chlorhexidine Gluconate Cloth  6 each Topical Q12H   darbepoetin (ARANESP) injection - DIALYSIS  60 mcg Subcutaneous Q Wed-1800   dorzolamide-timolol  1 drop Both Eyes BID   heparin   5,000 Units Subcutaneous Q8H   insulin aspart  0-9 Units Subcutaneous TID WC   insulin glargine-yfgn  2 Units Subcutaneous QHS   lanthanum  1,000 mg Oral TID WC   latanoprost  1 drop Both Eyes QHS   lisinopril  20 mg Oral BID   melatonin  5 mg Oral QHS   senna-docusate  2 tablet Oral BID    Dialysis Orders MWF - [place] 3:30hr, 400/A1.5, EDW 69.9kg, 2K/2.5Ca, TDC, heparin 6500 unit bolus + 2900 unit mid-run bolus - Mircera IV q 2 weeks - last 12/22 - Calcitriol 0.63mcg PO q HD  Assessment/Plan: Cervicaldiscitis/osteomyelitis: Blood culture showed Stap Epi. On Vanc 750 mcg IV q HD until 08/26/23 per ID Cervical spine stenosis/compression: Neurosurgery following, s/p fusion 1/6. ESRD:  Continue normal MWF HD schedule, next treatment 1/20 HTN/volume: BP improved.  Continue with Carvedilol 3.125 mg and Lisinopril 20 mg, amlodipine 5mg  added 1/14 Anemia of ESRD: Hgb 7.9Continue ESA 60 mcg Q Wednesday while in the hospital  Secondary HPTH: Ca and Phos okay.  Reduce lanthanum dose at patient request. Nutrition: Albumin low, continue supplements and ordered diet T2DM with retinopathy/blindness Dispo: In CIR Discharge date  1/28Arita Miss, MD  07/27/2023, 10:15 AM  [facility]

## 2023-07-27 NOTE — Progress Notes (Signed)
PROGRESS NOTE   Subjective/Complaints:  Pt doing alright, slept well, pain manageable, LBM 3 days ago per pt and he feels a bit constipated. States sometimes when he's constipated, it's due to his fosrenol dose; asked if he can take 500mg  instead of 1000mg -- ok'd with Dr. Marisue Humble. Anuric, dialysis yesterday was fine. Denies any other complaints or concerns today.   ROS:  Denies fevers, chills, N/V, abdominal pain, diarrhea, SOB, cough, chest pain, new weakness or paraesthesias.    Objective:   No results found. Recent Labs    07/26/23 0920  WBC 5.7  HGB 7.9*  HCT 25.5*  PLT 357    Recent Labs    07/26/23 0920  NA 128*  K 4.2  CL 92*  CO2 26  GLUCOSE 142*  BUN 18  CREATININE 6.31*  CALCIUM 8.6*     Intake/Output Summary (Last 24 hours) at 07/27/2023 1118 Last data filed at 07/27/2023 4098 Gross per 24 hour  Intake 340 ml  Output 2000 ml  Net -1660 ml     Pressure Injury 07/18/23 Sacrum Mid Stage 1 -  Intact skin with non-blanchable redness of a localized area usually over a bony prominence. (Active)  07/18/23 1630 (Assessed by Ivor Messier, LPN and Becky, RN)  Location: Sacrum  Location Orientation: Mid  Staging: Stage 1 -  Intact skin with non-blanchable redness of a localized area usually over a bony prominence.  Wound Description (Comments):   Present on Admission: Yes    Physical Exam: Vital Signs Blood pressure 134/60, pulse 74, temperature 97.7 F (36.5 C), temperature source Oral, resp. rate 18, height 6' (1.829 m), weight 61.5 kg, SpO2 100%.  Constitutional: No apparent distress. Appropriate appearance for age. Laying in bed.  HENT: Atraumatic, normocephalic. + Cervical collar--well-fitting. Eyes: Bilateral blindness-unchanged Cardiovascular: RRR, +soft flow murmur at LUSB, no rub/gallops. No Edema. Peripheral pulses 2+  Respiratory: CTAB this morning, no coughing or coarseness. No rales, rhonchi, or  wheezing. On RA.  Abdomen: + bowel sounds a little hypoactive.  Soft, nontender.  No distention.  Skin:  + PCDF site - steri strips intact, moderate serosanguinous drainage, no fluctuance or expressible discharge.  Overlying clean, dry gauze.--unchanged 1/17 + Anterior surgical site with Steri-Strips intact, no active drainage, clean and dry.- unchanged 1/18  PRIOR EXAMS: MSK:      No apparent deformity.  Difficulty with bilateral shoulder abduction >110 degrees due to pain--improving    Neurologic exam:  AAOx3.  Follows all simple commands.  No apparent cognitive deficits.  Cranial nerves intact.  Bilateral stocking glove pattern sensory neuropathy. Strength 4 out of 5 in proximal shoulders, otherwise 5- out of 5 throughout.  No tone, spasticity, or ataxia.  Good insight into current deficits  -Unchanged 1-17       Assessment/Plan: 1. Functional deficits which require 3+ hours per day of interdisciplinary therapy in a comprehensive inpatient rehab setting. Physiatrist is providing close team supervision and 24 hour management of active medical problems listed below. Physiatrist and rehab team continue to assess barriers to discharge/monitor patient progress toward functional and medical goals  Care Tool:  Bathing    Body parts bathed by patient: Face, Chest, Abdomen  Body parts bathed by helper: Right arm, Left arm, Front perineal area, Right lower leg, Left upper leg, Right upper leg, Buttocks, Left lower leg     Bathing assist Assist Level: Maximal Assistance - Patient 24 - 49%     Upper Body Dressing/Undressing Upper body dressing   What is the patient wearing?: Hospital gown only    Upper body assist Assist Level: Maximal Assistance - Patient 25 - 49%    Lower Body Dressing/Undressing Lower body dressing      What is the patient wearing?: Incontinence brief     Lower body assist Assist for lower body dressing: Total Assistance - Patient < 25%      Toileting Toileting    Toileting assist Assist for toileting: Maximal Assistance - Patient 25 - 49%     Transfers Chair/bed transfer  Transfers assist     Chair/bed transfer assist level: Minimal Assistance - Patient > 75%     Locomotion Ambulation   Ambulation assist      Assist level: Minimal Assistance - Patient > 75% Assistive device: Walker-rolling Max distance: 100'   Walk 10 feet activity   Assist     Assist level: Minimal Assistance - Patient > 75% Assistive device: Walker-rolling   Walk 50 feet activity   Assist Walk 50 feet with 2 turns activity did not occur: Safety/medical concerns  Assist level: Minimal Assistance - Patient > 75% Assistive device: Walker-rolling    Walk 150 feet activity   Assist Walk 150 feet activity did not occur: Safety/medical concerns         Walk 10 feet on uneven surface  activity   Assist Walk 10 feet on uneven surfaces activity did not occur: Safety/medical concerns         Wheelchair     Assist Is the patient using a wheelchair?: Yes Type of Wheelchair: Manual    Wheelchair assist level: Dependent - Patient 0%      Wheelchair 50 feet with 2 turns activity    Assist        Assist Level: Dependent - Patient 0%   Wheelchair 150 feet activity     Assist      Assist Level: Dependent - Patient 0%  Plan: Blood pressure 134/60, pulse 74, temperature 97.7 F (36.5 C), temperature source Oral, resp. rate 18, height 6' (1.829 m), weight 61.5 kg, SpO2 100%.  Medical Problem List and Plan: 1. 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 Per nursing no SLP needs.    2.   Antithrombotics: -DVT/anticoagulation:  Pharmaceutical: Heparin 5000U q8h             -antiplatelet therapy: continue aspirin 325 mg daily   3. Pain Management:  -Tylenol as needed -Norco as needed 5-10 mg q4 hours- might need Oxy if not enough- was taking IV Dilaudid solely on Acute.              -Flexeril 10 mg TID as needed   -Pain well-controlled on current regimen, continue. -1-14: Minimal use of PRNs.  Change Tylenol to 1000 mg 3 times daily as needed, reduce Norco  to 5 mg every 6 hours as needed only for moderate to severe pain. 1-16: Rare use of hydrocodone, reduced to 2.5 to 5 mg twice daily as needed and DC at discharge.  4. Mood/Behavior/Sleep: LCSW to evaluate and provide emotional support             -continue melatonin 5 mg q HS             -antipsychotic agents: n/a   - Sleeping well   5. Neuropsych/cognition: This patient is capable of making decisions on his own behalf.   6. Skin/Wound Care: Routine skin care checks             -monitor surgical incisions -1-14: Has been 1 week since postop, may remove PCDF honeycomb dressing.  Due to drainage, will have nursing take photo of this with dressing change. -- moderate serosanguinous drainage  07-25-15: Stable appearance, no further drainage on gauze, healing well   7. Fluids/Electrolytes/Nutrition: Strict Is and Os and follow-up chemistries per nephrology  - Admission labs with stable hyponatremia.  Follow q. weekly.--Stable, unchanged  8: Hypertension: monitor TID and prn -continue amlodipine 5 mg daily>> appears it was d/c'd on admission? Changed to carvedilol 3.125mg  BID             -continue lisinopril 20 mg BID  - 1-10: Mild blood pressure elevations today.  Monitor for trend, continue increase in amlodipine. -07/20/23 BPs ok, monitor for now, appears amlodipine was changed to carvedilol during admission-- no med adjustments today -07/21/23 BPs a little variable, monitor trend today with dialysis, may need adjustments  tomorrow 1/14: add back norvasc 5 mg dail, add Prn hydralazine 25 mg for SBP >180, DBP >110 1/15-16: Improving with norvasc; continue 1/17: Diastolic a little low, systolic much improved since starting Norvasc.  Monitor and can consider reduction to 2.5 mg daily if symptomatic.  -07/27/23 BPs great, monitor on current regimen Vitals:   07/26/23 0933 07/26/23 1000 07/26/23 1030 07/26/23 1100  BP: 138/64 (!) 154/70 (!) 153/70 (!) 148/71   07/26/23 1130 07/26/23 1200 07/26/23 1230 07/26/23 1240  BP: (!) 156/72 (!) 151/69 (!) 141/67 129/79   07/26/23 1247 07/26/23 1627 07/26/23 1937 07/27/23 0319  BP: (!) 153/76 (!) 132/59 135/64 134/60     9: Hyperlipidemia: continue atorvastatin 80mg  daily   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, Cosopt, Xalantan gtts   11: ESRD: HD>>M,W,F via right internal jugular TDC             -continue calcitrol 0.5 mcg M,W,F             -continue Aranesp 60 mcg every Wednesday             -continue Fosrenol 1,000 mg TID with meals -07/27/23 pt requesting fosrenol reduction d/t constipation, Dr. Marisue Humble ok with this, reduced to 500mg  TID   12: DM-2: A1c = 5.8%; at home on Tresiba Flextouch 12u at bedtime             -continue SSI             -continue Semglee 2 units q HS    - 07/27/23 Well-controlled on current regimen Recent Labs    07/26/23 1620 07/26/23 2100 07/27/23 0604  GLUCAP 91 146* 134*     13: Constipation: continue MiraLax daily, Senokot-S BID; prns ordered-resolved  - 1-10: Positive results with enema overnight.  Increase Miralax to 17 g twice daily and Senokot 2S to 2 tabs twice daily.  Add docusate suppository for moderate constipation  and enema for severe constipation.  No signs concerning for obstruction. -07/20/23 only small BMs recently, wants to try another soap suds enema, will do that to see if we decrease stool burden.  -07/21/23 enema yesterday helped significantly, cont meds for now, if oversoftened then would back off stool  regimen.  1-13: Start bowel program as below.  Stools remain liquid, so we will DC daily MiraLAX and have Senokot as 2 hours before bowel program at night. - see #17  14: Discitis/osteomyelitis with Staph epidermidis bacteremia:  -continue vancomycin 750 mg with HD through 08/26/2023- will likely change to Chronic Doxycycline after that, but need to reach out to ID when close to d/c to verify -1/14: Seems to have missed dosed with Monday's dialysis, got additional dose today per pharmacy.   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 Dutch Quint 1-13, may remove for bathing and changing collar only.             -follow-up with Dr. Jordan Likes   16: History of prior CVA: on aspirin and statin   17. Neurogenic bowel- resolved If doesn't work, will need soap suds enema, etc- might need KUB -See #13; had positive bowel movement with enema overnight, increasing bowel regimen and may need daily bowel program if no results in next 1 to 2 days - 1/13: 2x enema with BM; discussed with patient as above, will start nightly bowel program at 8 PM with Magic bullet suppository and digital stim with no results. 1-14.  Refused bowel program overnight.  Educated patient on importance of consistency with this, he is agreeable. 1/15: Patient now having daily, continent bowel movements. All bowel meds moved to PRN.  1-16.  Patient thinks he had incontinent bowel movement overnight, not recorded per nursing, will continue to monitor and may resume bowel program if further incontinent episodes.  Of note, patient states that he did not refuse suppository, claims he was never offered it.  1/15 LBM --increased sennakot S to 2 tabs BID -07/27/23 LBM 07/25/23, reduced fosrenol as above, monitor to see if this helps with his constipation    LOS: 9 days A FACE TO FACE EVALUATION WAS  PERFORMED  [address] [employer] 07/27/2023, 11:18 AM

## 2023-07-28 LAB — GLUCOSE, CAPILLARY
Glucose-Capillary: 75 mg/dL (ref 70–99)
Glucose-Capillary: 122 mg/dL — ABNORMAL HIGH (ref 70–99)
Glucose-Capillary: 58 mg/dL — ABNORMAL LOW (ref 70–99)
Glucose-Capillary: 73 mg/dL (ref 70–99)
Glucose-Capillary: 119 mg/dL — ABNORMAL HIGH (ref 70–99)

## 2023-07-28 MED ORDER — ALUM & MAG HYDROXIDE-SIMETH 200-200-20 MG/5ML PO SUSP
15.0000 mL | Freq: Four times a day (QID) | ORAL | Status: DC | PRN
  Administered 2023-07-28: 15 mL via ORAL
  Filled 2023-07-28 (×3): qty 30

## 2023-07-28 MED ORDER — DARBEPOETIN ALFA 100 MCG/0.5ML IJ SOSY
100.0000 ug | PREFILLED_SYRINGE | INTRAMUSCULAR | Status: DC
  Administered 2023-07-31: 100 ug via SUBCUTANEOUS
  Filled 2023-07-28: qty 0.5

## 2023-07-28 NOTE — Progress Notes (Signed)
PROGRESS NOTE   Subjective/Complaints:  Pt doing ok, slept poorly though. Denies pain today. LBM 3x yesterday into today, feeling better. Anuric. Denies any other complaints or concerns today.   ROS:  Denies fevers, chills, N/V, abdominal pain, diarrhea, SOB, cough, chest pain, new weakness or paraesthesias.    Objective:   No results found. Recent Labs    07/26/23 0920  WBC 5.7  HGB 7.9*  HCT 25.5*  PLT 357    Recent Labs    07/26/23 0920  NA 128*  K 4.2  CL 92*  CO2 26  GLUCOSE 142*  BUN 18  CREATININE 6.31*  CALCIUM 8.6*     Intake/Output Summary (Last 24 hours) at 07/28/2023 1029 Last data filed at 07/28/2023 0919 Gross per 24 hour  Intake 476 ml  Output --  Net 476 ml     Pressure Injury 07/18/23 Sacrum Mid Stage 1 -  Intact skin with non-blanchable redness of a localized area usually over a bony prominence. (Active)  07/18/23 1630 (Assessed by Ivor Messier, LPN and Becky, RN)  Location: Sacrum  Location Orientation: Mid  Staging: Stage 1 -  Intact skin with non-blanchable redness of a localized area usually over a bony prominence.  Wound Description (Comments):   Present on Admission: Yes    Physical Exam: Vital Signs Blood pressure 139/66, pulse 78, temperature 97.9 F (36.6 C), temperature source Oral, resp. rate 18, height 6' (1.829 m), weight 61.5 kg, SpO2 99%.  Constitutional: No apparent distress. Appropriate appearance for age. Laying in bed.  HENT: Atraumatic, normocephalic. + Cervical collar--well-fitting. Eyes: Bilateral blindness-unchanged Cardiovascular: RRR, +soft flow murmur at LUSB, no rub/gallops. No Edema. Peripheral pulses 2+  Respiratory: CTAB , No rales, rhonchi, or wheezing. On RA.  Abdomen: + bowel sounds more normoactive.  Soft, nontender.  No distention.  Skin:  + PCDF site - steri strips intact, moderate serosanguinous drainage, no fluctuance or expressible discharge.   Overlying clean, dry gauze.--unchanged 1/17 + Anterior surgical site with Steri-Strips intact, no active drainage, clean and dry.- unchanged 1/19  PRIOR EXAMS: MSK:      No apparent deformity.  Difficulty with bilateral shoulder abduction >110 degrees due to pain--improving    Neurologic exam:  AAOx3.  Follows all simple commands.  No apparent cognitive deficits.  Cranial nerves intact.  Bilateral stocking glove pattern sensory neuropathy. Strength 4 out of 5 in proximal shoulders, otherwise 5- out of 5 throughout.  No tone, spasticity, or ataxia.  Good insight into current deficits  -Unchanged 1-17       Assessment/Plan: 1. Functional deficits which require 3+ hours per day of interdisciplinary therapy in a comprehensive inpatient rehab setting. Physiatrist is providing close team supervision and 24 hour management of active medical problems listed below. Physiatrist and rehab team continue to assess barriers to discharge/monitor patient progress toward functional and medical goals  Care Tool:  Bathing    Body parts bathed by patient: Face, Chest, Abdomen   Body parts bathed by helper: Right arm, Left arm, Front perineal area, Right lower leg, Left upper leg, Right upper leg, Buttocks, Left lower leg     Bathing assist Assist Level: Maximal Assistance - Patient 24 -  49%     Upper Body Dressing/Undressing Upper body dressing   What is the patient wearing?: Hospital gown only    Upper body assist Assist Level: Maximal Assistance - Patient 25 - 49%    Lower Body Dressing/Undressing Lower body dressing      What is the patient wearing?: Incontinence brief     Lower body assist Assist for lower body dressing: Total Assistance - Patient < 25%     Toileting Toileting    Toileting assist Assist for toileting: Maximal Assistance - Patient 25 - 49%     Transfers Chair/bed transfer  Transfers assist     Chair/bed transfer assist level: Minimal Assistance - Patient >  75%     Locomotion Ambulation   Ambulation assist      Assist level: Minimal Assistance - Patient > 75% Assistive device: Walker-rolling Max distance: 100'   Walk 10 feet activity   Assist     Assist level: Minimal Assistance - Patient > 75% Assistive device: Walker-rolling   Walk 50 feet activity   Assist Walk 50 feet with 2 turns activity did not occur: Safety/medical concerns  Assist level: Minimal Assistance - Patient > 75% Assistive device: Walker-rolling    Walk 150 feet activity   Assist Walk 150 feet activity did not occur: Safety/medical concerns         Walk 10 feet on uneven surface  activity   Assist Walk 10 feet on uneven surfaces activity did not occur: Safety/medical concerns         Wheelchair     Assist Is the patient using a wheelchair?: Yes Type of Wheelchair: Manual    Wheelchair assist level: Dependent - Patient 0%      Wheelchair 50 feet with 2 turns activity    Assist        Assist Level: Dependent - Patient 0%   Wheelchair 150 feet activity     Assist      Assist Level: Dependent - Patient 0%  Plan: Blood pressure 139/66, pulse 78, temperature 97.9 F (36.6 C), temperature source Oral, resp. rate 18, height 6' (1.829 m), weight 61.5 kg, SpO2 99%.  Medical Problem List and Plan: 1. 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 Per nursing no SLP needs.    2.  Antithrombotics: -DVT/anticoagulation:  Pharmaceutical: Heparin 5000U q8h             -antiplatelet therapy: continue aspirin 325 mg daily   3. Pain Management:  -Tylenol as needed -Norco as needed 5-10 mg q4 hours- might need Oxy if not  enough- was taking IV Dilaudid solely on Acute.              -Flexeril 10 mg TID as needed   -Pain well-controlled on current regimen, continue. -1-14: Minimal use of PRNs.  Change Tylenol to 1000 mg 3 times daily as needed, reduce Norco to 5 mg every 6 hours as needed only for moderate to severe pain. 1-16: Rare use of hydrocodone, reduced to 2.5 to 5 mg twice daily as needed and DC at discharge.  4. Mood/Behavior/Sleep: LCSW to evaluate  and provide emotional support             -continue melatonin 5 mg q HS             -antipsychotic agents: n/a   - Sleeping well-- 1/19 poor sleep last night, monitor for now   5. Neuropsych/cognition: This patient is capable of making decisions on his own behalf.   6. Skin/Wound Care: Routine skin care checks             -monitor surgical incisions -1-14: Has been 1 week since postop, may remove PCDF honeycomb dressing.  Due to drainage, will have nursing take photo of this with dressing change. -- moderate serosanguinous drainage  07-25-15: Stable appearance, no further drainage on gauze, healing well   7. Fluids/Electrolytes/Nutrition: Strict Is and Os and follow-up chemistries per nephrology  - Admission labs with stable hyponatremia.  Follow q. weekly.--Stable, unchanged  8: Hypertension: monitor TID and prn -continue amlodipine 5 mg daily>> appears it was d/c'd on admission? Changed to carvedilol 3.125mg  BID             -continue lisinopril 20 mg BID  - 1-10: Mild blood pressure elevations today.  Monitor for trend, continue increase in amlodipine. -07/20/23 BPs ok, monitor for now, appears amlodipine was changed to carvedilol during admission-- no med adjustments today -07/21/23 BPs a little variable, monitor trend today with dialysis, may need adjustments tomorrow 1/14: add back norvasc 5 mg dail, add Prn hydralazine 25 mg for SBP >180, DBP >110 1/15-16: Improving with norvasc; continue 1/17: Diastolic a little low, systolic much improved since  starting Norvasc.  Monitor and can consider reduction to 2.5 mg daily if symptomatic.  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 Marisue Humble ok with this, reduced to 500mg  TID   12: DM-2: A1c = 5.8%; at home on Tresiba Flextouch 12u at bedtime             -continue SSI             -continue Semglee 2 units q HS    - 1/18-19/25 Well-controlled on current regimen Recent Labs    07/27/23 1627 07/27/23 2106 07/28/23 0326  GLUCAP 107* 94 75     13: Constipation: continue MiraLax daily, Senokot-S BID; prns ordered-resolved  - 1-10: Positive results with enema overnight.  Increase Miralax to 17 g twice daily and Senokot 2S to 2 tabs twice daily.  Add docusate suppository for moderate constipation and enema for severe constipation.  No signs concerning for obstruction. -07/20/23 only small BMs recently, wants to try another soap suds enema, will do that to see if we decrease stool  burden.  -07/21/23 enema yesterday helped significantly, cont meds for now, if oversoftened then would back off stool regimen.  1-13: Start bowel program as below.  Stools remain liquid, so we will DC daily MiraLAX and have Senokot as 2 hours before bowel program at night. - see #17  14: Discitis/osteomyelitis  with Staph epidermidis bacteremia:  -continue vancomycin 750 mg with HD through 08/26/2023- will likely change to Chronic Doxycycline after that, but need to reach out to ID when close to d/c to verify -1/14: Seems to have missed dosed with Monday's dialysis, got additional dose today per pharmacy.   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 Dutch Quint 1-13, may remove for bathing and changing collar only.             -follow-up with Dr. Jordan Likes   16: History of prior CVA: on aspirin and statin   17. Neurogenic bowel- resolved If doesn't work, will need soap suds enema, etc- might need KUB -See #13; had positive bowel movement with enema overnight, increasing bowel regimen and may need daily bowel program if no results in next 1 to 2 days - 1/13: 2x enema with BM; discussed with patient as above, will start nightly bowel program at 8 PM with Magic bullet suppository and digital stim with no results. 1-14.  Refused bowel program overnight.  Educated patient on importance of consistency with this, he is agreeable. 1/15: Patient now having daily, continent bowel movements. All bowel meds moved to PRN.  1-16.  Patient thinks he had incontinent bowel movement overnight, not recorded per nursing, will continue to monitor and may resume bowel program if further incontinent episodes.  Of note, patient states that he did not refuse suppository, claims he was never offered it.  1/15 LBM --increased sennakot S to 2 tabs BID -07/27/23 LBM 07/25/23, reduced fosrenol as above, monitor to see if this helps with his constipation -07/28/23 multiple BMs yesterday into today, feeling better, cont regimen for now, hopefully things start to regulate better    LOS: 10 days A FACE TO FACE EVALUATION WAS PERFORMED  [address] 07/28/2023, 10:29 AM

## 2023-07-28 NOTE — Progress Notes (Signed)
  [facility] Progress Note   Subjective:   Seen in room - shoulder soreness, but otherwise no complaints. No CP/dyspnea. For HD tomorrow.  Objective Vitals:   07/27/23 0512 07/27/23 1259 07/27/23 1923 07/28/23 0327  BP:  127/61 (!) 132/59 139/66  Pulse:  71 71 78  Resp:  16 18 18   Temp:  98.5 F (36.9 C) 97.8 F (36.6 C) 97.9 F (36.6 C)  TempSrc:  Oral Oral Oral  SpO2:  100% 100% 99%  Weight: 61.5 kg     Height:       Physical Exam General: Well appearing man, NAD. Hard neck brace in place, blind. Heart: RRR; 3/6 murmur Lungs: CTA anteriorly Abdomen: soft Extremities: no LE edema Dialysis Access:  TDC in R chest  Additional Objective Labs: Basic Metabolic Panel: Recent Labs  Lab 07/24/23 0528 07/26/23 0920  NA 135 128*  K 4.0 4.2  CL 96* 92*  CO2 30 26  GLUCOSE 93 142*  BUN 13 18  CREATININE 5.57* 6.31*  CALCIUM 8.5* 8.6*  PHOS 3.9 3.6   Liver Function Tests: Recent Labs  Lab 07/24/23 0528 07/26/23 0920  ALBUMIN 2.6* 2.7*   CBC: Recent Labs  Lab 07/24/23 0528 07/26/23 0920  WBC 5.9 5.7  HGB 8.6* 7.9*  HCT 27.5* 25.5*  MCV 84.4 86.1  PLT 389 357   Medications:  heparin sodium (porcine)     vancomycin (VANCOCIN) 750 mg in sodium chloride 0.9 % 250 mL IVPB Stopped (07/26/23 1224)    (feeding supplement) PROSource Plus  30 mL Oral BID BM   amLODipine  5 mg Oral Daily   aspirin EC  325 mg Oral QHS   atorvastatin  80 mg Oral Daily   brimonidine  1 drop Both Eyes BID   calcitRIOL  0.5 mcg Oral Q M,W,F   carvedilol  3.125 mg Oral BID WC   Chlorhexidine Gluconate Cloth  6 each Topical Q12H   darbepoetin (ARANESP) injection - DIALYSIS  60 mcg Subcutaneous Q Wed-1800   dorzolamide-timolol  1 drop Both Eyes BID   heparin  5,000 Units Subcutaneous Q8H   insulin aspart  0-9 Units Subcutaneous TID WC   insulin glargine-yfgn  2 Units Subcutaneous QHS   lanthanum  500 mg Oral TID WC   latanoprost  1 drop Both Eyes QHS   lisinopril  20  mg Oral BID   melatonin  5 mg Oral QHS   senna-docusate  2 tablet Oral BID    Dialysis Orders MWF - [facility] 3:30hr, 400/A1.5, EDW 69.9kg, 2K/2.5Ca, TDC, heparin 6500 unit bolus + 2900 unit mid-run bolus - Mircera IV q 2 weeks - last 12/22 - Calcitriol 0.57mcg PO q HD   Assessment/Plan: Cervical discitis/osteomyelitis: Blood Cx + MRSE. 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 ESRD:  Continue MWF HD schedule, next HD 1/20. HTN/volume: BP controlled. Continue current regimen. Well below prior EDW, lower on d/c. 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   Secondary HPTH: Ca/Phos to goal. Lanthanum dose reduced per patient request.  Nutrition: Albumin low, continue supplements and ordered diet T2DM with retinopathy/blindness Dispo: In CIR, tentative discharge date 1/28.    Ozzie Hoyle, PA-C 07/28/2023, 8:40 AM  [employer]

## 2023-07-28 NOTE — Progress Notes (Signed)
Hypoglycemic Event  CBG: 58  Treatment: 4 oz juice/soda  Symptoms: None  Follow-up CBG: Time:16:33 CBG Result:73  Possible Reasons for Event: Inadequate meal intake  Comments/MD notified: Protocol followed    Shawn [name]

## 2023-07-28 NOTE — Progress Notes (Signed)
Pharmacy Antibiotic Note  Shawn [name] is a 63 y.o. male admitted on 07/18/2023 with  MRSE discitis .  Pharmacy has been consulted for Vancomycin dosing.  Patient is on HD MWF. Last random Vancomycin level at goal of 25. Continue current dose. End of treatment 08/26/23, nephrology to dose outpatient.   Plan: Continue Vancomycin 750 mg every HD MWF  Continue through 08/26/23 per ID Levels as indicated clinically  Height: 6' (182.9 cm) Weight: 61.5 kg (135 lb 9.3 oz) IBW/kg (Calculated) : 77.6  Temp (24hrs), Avg:98.1 F (36.7 C), Min:97.8 F (36.6 C), Max:98.5 F (36.9 C)  Recent Labs  Lab 07/24/23 0528 07/26/23 0920  WBC 5.9 5.7  CREATININE 5.57* 6.31*  VANCORANDOM 25  --     Estimated Creatinine Clearance: 10.6 mL/min (A) (by C-G formula based on SCr of 6.31 mg/dL (H)).    No Known Allergies  Dose adjustments this admission: N/a  Microbiology results: 12/31 MRSA PCR: negative 12/31 Bcx: GPC in clusters>>3/4 MRSE 1/2 Blood: negF 1/3 MRSA PCR: negative   Vertis Scheib A. Jeanella Craze, PharmD, BCPS, FNKF Clinical Pharmacist [place] Please utilize Amion for appropriate phone number to reach the unit pharmacist [facility])  07/28/2023 7:54 AM

## 2023-07-29 LAB — GLUCOSE, CAPILLARY
Glucose-Capillary: 121 mg/dL — ABNORMAL HIGH (ref 70–99)
Glucose-Capillary: 66 mg/dL — ABNORMAL LOW (ref 70–99)
Glucose-Capillary: 86 mg/dL (ref 70–99)
Glucose-Capillary: 99 mg/dL (ref 70–99)

## 2023-07-29 LAB — CBC
HCT: 28.8 % — ABNORMAL LOW (ref 39.0–52.0)
Hemoglobin: 9.2 g/dL — ABNORMAL LOW (ref 13.0–17.0)
MCH: 26.7 pg (ref 26.0–34.0)
MCHC: 31.9 g/dL (ref 30.0–36.0)
MCV: 83.5 fL (ref 80.0–100.0)
Platelets: 432 10*3/uL — ABNORMAL HIGH (ref 150–400)
RBC: 3.45 MIL/uL — ABNORMAL LOW (ref 4.22–5.81)
RDW: 16.8 % — ABNORMAL HIGH (ref 11.5–15.5)
WBC: 7.4 10*3/uL (ref 4.0–10.5)
nRBC: 0 % (ref 0.0–0.2)

## 2023-07-29 LAB — RENAL FUNCTION PANEL
Albumin: 2.8 g/dL — ABNORMAL LOW (ref 3.5–5.0)
Anion gap: 12 (ref 5–15)
BUN: 26 mg/dL — ABNORMAL HIGH (ref 8–23)
CO2: 26 mmol/L (ref 22–32)
Calcium: 8.9 mg/dL (ref 8.9–10.3)
Chloride: 90 mmol/L — ABNORMAL LOW (ref 98–111)
Creatinine, Ser: 7.66 mg/dL — ABNORMAL HIGH (ref 0.61–1.24)
GFR, Estimated: 7 mL/min — ABNORMAL LOW (ref >60)
Glucose, Bld: 57 mg/dL — ABNORMAL LOW (ref 70–99)
Phosphorus: 3.4 mg/dL (ref 2.5–4.6)
Potassium: 4.9 mmol/L (ref 3.5–5.1)
Sodium: 128 mmol/L — ABNORMAL LOW (ref 135–145)

## 2023-07-29 MED ORDER — INSULIN ASPART 100 UNIT/ML IJ SOLN
0.0000 [IU] | Freq: Three times a day (TID) | INTRAMUSCULAR | Status: DC
Start: 2023-07-29 — End: 2023-08-04
  Administered 2023-07-30 – 2023-08-04 (×5): 1 [IU] via SUBCUTANEOUS

## 2023-07-29 MED ORDER — PENTAFLUOROPROP-TETRAFLUOROETH EX AERO: 1.0000 | INHALATION_SPRAY | CUTANEOUS | Status: DC | PRN

## 2023-07-29 MED ORDER — ANTICOAGULANT SODIUM CITRATE 4% (200MG/5ML) IV SOLN: 5.0000 mL | Status: DC | PRN

## 2023-07-29 MED ORDER — LIDOCAINE HCL (PF) 1 % IJ SOLN: 5.0000 mL | INTRAMUSCULAR | Status: DC | PRN

## 2023-07-29 MED ORDER — ALTEPLASE 2 MG IJ SOLR: 2.0000 mg | Freq: Once | INTRAMUSCULAR | Status: DC | PRN

## 2023-07-29 MED ORDER — HEPARIN SODIUM (PORCINE) 1000 UNIT/ML DIALYSIS: 1000.0000 [IU] | INTRAMUSCULAR | Status: DC | PRN

## 2023-07-29 MED ORDER — HEPARIN SODIUM (PORCINE) 1000 UNIT/ML DIALYSIS
5000.0000 [IU] | Freq: Once | INTRAMUSCULAR | Status: AC
  Administered 2023-07-29: 5000 [IU] via INTRAVENOUS_CENTRAL

## 2023-07-29 MED ORDER — LIDOCAINE-PRILOCAINE 2.5-2.5 % EX CREA: 1.0000 | TOPICAL_CREAM | CUTANEOUS | Status: DC | PRN

## 2023-07-29 MED ORDER — HEPARIN SODIUM (PORCINE) 1000 UNIT/ML IJ SOLN
INTRAMUSCULAR | Status: AC
  Filled 2023-07-29: qty 5

## 2023-07-29 NOTE — Progress Notes (Signed)
Physical Therapy Session Note  Patient Details  Name: Shawn [name] MRN: [id number] Date of Birth: 1961-01-24  Today's Date: 07/29/2023 PT Individual Time: 2202-5427 + 1102-1200 PT Individual Time Calculation (min): 70 min + 58 min  Short Term Goals: Week 2:  PT Short Term Goal 1 (Week 2): Pt will ambulate 150' with LRAD CGA PT Short Term Goal 2 (Week 2): Pt willl complete transfer with RW CGA PT Short Term Goal 3 (Week 2): Pt will maintain dynamic standing balance during functional task with supervision  Skilled Therapeutic Interventions/Progress Updates:    SESSION 1: Pt presents in room in bed with wife present for family education. Pt denies pain at this time, reports having stomach pain over the weekend that has now resolved. Session focused on transfer training, gait training, and NMR for dynamic standing balance and single limb stability.  Pt completes bed mobility with supervision increased time. Pt completes sit to stand to RW with supervision. Pt remains standing ~2 min with supervision for therapist to manage brief as pt reporting feeling like he required periarea hygiene however no incontinence noted. Pt completes stand step transfer with RW with supervision and verbal cues for positioning.  Pt transported to day room dependently for time management. Pt ambulates with RW 175' with CGA for postural stabiltiy, min assist for managing RW direction with turns. Pt requires seated rest break. Pt then completes ambulation 2x50' with SPC, CGA/min assist for postural stability. 1st trial pt ambulates with therapist, 2nd trial pt ambulates with pt wife, holding onto her elbow for ambulation with pt demonstrating good postural stability, pt wife providing good verbal cues.  Pt then transported to ortho room dependently for time management. Pt completes ambulatory transfer with [facility] CGA WC to car with cues for positioning. Pt completes up/down ramp with SPC and L HHA with min assist,  decreased gait speed and increased postural sway over uneven surface.  Pt transported back to day room and completes NMR in standing without UE support for single limb stability and dynamic standing balance including: - standing marches x20  - forward/lateral/backward step x10 BLE (min/mod assist for postural stability, seated rest break between activities) - mini squats x10 - heel raise x10 (minimal clearance)  Pt returns to room and remains seated in [facility] with all needs within reach, call light in place and chair alarm donned and activated at end of session.   SESSION 2: Pt presents in room seated in TIS WC, reporting feeling fatigue but agreeable to PT. Session focused on therapeutic exercise for BLE strengthening and endurance needed for functional transfers and ambulation.'  Pt transported to day room dependently for time management. Pt completes stand step transfer without device min assist to nustep. Pt completes continuous training with BLEs only workload 4 for 10 minutes to promote BLE muscular endurance needed for tolernace to upright mobility.  Pt completes transfer back to [facility] then transfer to EOM with min assist. Pt completes sit to supine with supervision verbal cues.  Pt completes supine therex for BLE strengthening and muscle fiber recruitment needed for fucntional and gait including: - LAQs 3# ankle weight x10 BLE - hamstring curl (BLEs on bolster) x10 BLE - SLR 2x5 BLE 3# ankle weights - supine clamshells red tband - bent knee fall outs x10 - ankle DF/PF red band x10  - ankle eversion/inversion AROM x10  Pt completes supine to sit EOM with supervision increased time and verbal cues for sequencing. Pt completes stand step transfer to [facility] and transported  back to room. Pt completes stand step transfer WC to bed and returns to supine with supervision. Pt remains supine with all needs within reach, call light in place, bed alarm activated at end of session.    Therapy  Documentation Precautions:  Precautions Precautions: Fall, Cervical Precaution Comments: pt blind; functional in own enviroment; cervical brace OOB to be aplied in sitting Required Braces or Orthoses: Cervical Brace Cervical Brace: Hard collar Restrictions Weight Bearing Restrictions Per Provider Order: No   Therapy/Group: Individual Therapy  Edwin Cap PT, DPT 07/29/2023, 3:47 PM

## 2023-07-29 NOTE — Progress Notes (Signed)
  [facility] Progress Note   Subjective:   Seen in room. Eating lunch. No new concerns. HD today.   Objective Vitals:   07/28/23 1759 07/28/23 1910 07/29/23 0411 07/29/23 1308  BP: (!) 157/75 137/63 130/62 (!) 141/66  Pulse: 79 81 70 69  Resp:  18 18 18   Temp:  98.3 F (36.8 C) 98.4 F (36.9 C) 98 F (36.7 C)  TempSrc:  Oral Oral Oral  SpO2:  100% 100% 100%  Weight:      Height:       Physical Exam General: Well appearing man, NAD. Hard neck brace in place, blind. Heart: RRR; 3/6 murmur Lungs: CTA anteriorly Abdomen: soft Extremities: no LE edema Dialysis Access:  TDC in R chest  Additional Objective Labs: Basic Metabolic Panel: Recent Labs  Lab 07/24/23 0528 07/26/23 0920 07/29/23 1028  NA 135 128* 128*  K 4.0 4.2 4.9  CL 96* 92* 90*  CO2 30 26 26   GLUCOSE 93 142* 57*  BUN 13 18 26*  CREATININE 5.57* 6.31* 7.66*  CALCIUM 8.5* 8.6* 8.9  PHOS 3.9 3.6 3.4   Liver Function Tests: Recent Labs  Lab 07/24/23 0528 07/26/23 0920 07/29/23 1028  ALBUMIN 2.6* 2.7* 2.8*   CBC: Recent Labs  Lab 07/24/23 0528 07/26/23 0920 07/29/23 1028  WBC 5.9 5.7 7.4  HGB 8.6* 7.9* 9.2*  HCT 27.5* 25.5* 28.8*  MCV 84.4 86.1 83.5  PLT 389 357 432*   Medications:  anticoagulant sodium citrate     heparin sodium (porcine)     vancomycin (VANCOCIN) 750 mg in sodium chloride 0.9 % 250 mL IVPB Stopped (07/26/23 1224)    (feeding supplement) PROSource Plus  30 mL Oral BID BM   amLODipine  5 mg Oral Daily   aspirin EC  325 mg Oral QHS   atorvastatin  80 mg Oral Daily   brimonidine  1 drop Both Eyes BID   calcitRIOL  0.5 mcg Oral Q M,W,F   carvedilol  3.125 mg Oral BID WC   Chlorhexidine Gluconate Cloth  6 each Topical Q12H   [START ON 07/31/2023] darbepoetin (ARANESP) injection - DIALYSIS  100 mcg Subcutaneous Q Wed-1800   dorzolamide-timolol  1 drop Both Eyes BID   heparin  5,000 Units Subcutaneous Q8H   heparin  5,000 Units Dialysis Once in dialysis    insulin aspart  0-6 Units Subcutaneous TID WC   lanthanum  500 mg Oral TID WC   latanoprost  1 drop Both Eyes QHS   lisinopril  20 mg Oral BID   melatonin  5 mg Oral QHS   senna-docusate  2 tablet Oral BID    Dialysis Orders MWF - [place] 3:30hr, 400/A1.5, EDW 69.9kg, 2K/2.5Ca, TDC, heparin 6500 unit bolus + 2900 unit mid-run bolus - Mircera IV q 2 weeks - last 12/22 - Calcitriol 0.31mcg PO q HD   Assessment/Plan: Cervical discitis/osteomyelitis: Blood Cx + MRSE. 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 ESRD:  Continue MWF HD schedule, next HD 1/20. HTN/volume: BP controlled. Continue current regimen. Well below prior EDW, lower on d/c. 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   Secondary HPTH: Ca/Phos to goal. Lanthanum dose reduced per patient request.  Nutrition: Albumin low, continue supplements and ordered diet T2DM with retinopathy/blindness Dispo: In CIR, tentative discharge date 1/28.    Tomasa Blase PA-C [facility] 07/29/2023,1:37 PM

## 2023-07-29 NOTE — Progress Notes (Signed)
Hypoglycemic Event  CBG: 66  Treatment: 4 oz juice/soda  Symptoms: Dizziness  Follow-up CBG: Time: 12:43 CBG Result:86  Possible Reasons for Event: Change in activity  Comments/MD notified: Followed protocol    Linus Galas

## 2023-07-29 NOTE — Progress Notes (Signed)
Received patient in bed to unit.  Alert and oriented.  Informed consent signed and in chart.   TX duration:3.5 hours  Patient tolerated well.  Transported back to the room  Alert, without acute distress.  Hand-off given to patient's nurse.   Access used: R internal jugular HD Cath Access issues: none  Total UF removed: 1.5L Medication(s) given: Vancomycin   07/29/23 1911  Vitals  Temp 97.9 F (36.6 C)  Temp Source Oral  BP (!) 169/70  Pulse Rate 84  ECG Heart Rate 84  Resp 15  Oxygen Therapy  SpO2 100 %  O2 Device Room Air  During Treatment Monitoring  Duration of HD Treatment -hour(s) 3.5 hour(s)  HD Safety Checks Performed Yes  Intra-Hemodialysis Comments Tx completed;Tolerated well  Dialysis Fluid Bolus Normal Saline  Bolus Amount (mL) 300 mL  Post Treatment  Dialyzer Clearance Lightly streaked  Liters Processed 84  Fluid Removed (mL) 1500 mL  Tolerated HD Treatment Yes  Hemodialysis Catheter Right Internal jugular Double lumen Permanent (Tunneled)  Placement Date/Time: 07/12/23 1816   Placed prior to admission: No  Serial / Lot #: [id number]  Expiration Date: 12/07/27  Time Out: Correct patient;Correct procedure;Correct site  Maximum sterile barrier precautions: Hand hygiene;Cap;Mask;Sterile gown...  Site Condition No complications  Blue Lumen Status Flushed;Heparin locked;Dead end cap in place  Red Lumen Status Flushed;Heparin locked;Dead end cap in place  Purple Lumen Status N/A  Catheter fill solution Heparin 1000 units/ml  Catheter fill volume (Arterial) 1.9 cc  Catheter fill volume (Venous) 1.9  Dressing Type Transparent  Dressing Status Clean, Dry, Intact  Interventions Other (Comment) (deaccessed)  Drainage Description None  Dressing Change Due 08/02/23  Post treatment catheter status Capped and Clamped     Stacie Glaze LPN [facility]

## 2023-07-29 NOTE — Progress Notes (Signed)
Occupational Therapy Session Note  Patient Details  Name: Shawn [name] MRN: [id number] Date of Birth: 04-20-1961  Today's Date: 07/29/2023 OT Individual Time: 4098-1191 OT Individual Time Calculation (min): 60 min    Short Term Goals: Week 2:  OT Short Term Goal 1 (Week 2): STG=LTG's based on LOS  Skilled Therapeutic Interventions/Progress Updates:  Pt seen for skilled OT session with focus on Family Education with wife. Pt up in TIS and wife bedside. OT educated on spinal precautions, use of figure 4 for functional reach and dressing techniques. Pt and wife set up and Supervision UB, CGA with figure 4 techniques for socks, shoes as well as threading pants over feet, CGA for transfers to commode, standing sink side with CGA to perform oral care with RW, UE HEP with theraputty, light weights and light tband training with wife and provided in writing for carryover. Rec HHOT. OT instructed wife on changing Aspen collar padding and strategies for positioning, pain management and skin care and protection. Left pt set up in TIS with wife badside, needs and nursec all button in reach.   Pain: denies pain including B shoulders  Therapy Documentation Precautions:  Precautions Precautions: Fall, Cervical Precaution Comments: pt blind; functional in own enviroment; cervical brace OOB to be aplied in sitting Required Braces or Orthoses: Cervical Brace Cervical Brace: Hard collar Restrictions Weight Bearing Restrictions Per Provider Order: No   Therapy/Group: Individual Therapy  [name] 07/29/2023, 7:29 AM

## 2023-07-29 NOTE — Progress Notes (Signed)
PROGRESS NOTE   Subjective/Complaints: Patient not in room x 2, went to dialysis earlier this morning.  No concerns per nursing. Hypoglycemia this a.m. 58, responsive to juice.  Otherwise, no events overnight. Vital stable, labs pending with dialysis. Last bowel movement 1-19, medium, continent  ROS:  Denies fevers, chills, N/V, abdominal pain, diarrhea, SOB, cough, chest pain, new weakness or paraesthesias.    Objective:   No results found. Recent Labs    07/26/23 0920  WBC 5.7  HGB 7.9*  HCT 25.5*  PLT 357    Recent Labs    07/26/23 0920  NA 128*  K 4.2  CL 92*  CO2 26  GLUCOSE 142*  BUN 18  CREATININE 6.31*  CALCIUM 8.6*     Intake/Output Summary (Last 24 hours) at 07/29/2023 0806 Last data filed at 07/28/2023 1700 Gross per 24 hour  Intake 714 ml  Output --  Net 714 ml     Pressure Injury 07/18/23 Sacrum Mid Stage 1 -  Intact skin with non-blanchable redness of a localized area usually over a bony prominence. (Active)  07/18/23 1630 (Assessed by Ivor Messier, LPN and Becky, RN)  Location: Sacrum  Location Orientation: Mid  Staging: Stage 1 -  Intact skin with non-blanchable redness of a localized area usually over a bony prominence.  Wound Description (Comments):   Present on Admission: Yes    Physical Exam: Vital Signs Blood pressure 130/62, pulse 70, temperature 98.4 F (36.9 C), temperature source Oral, resp. rate 18, height 6' (1.829 m), weight 61.5 kg, SpO2 100%. Prior exams: Constitutional: No apparent distress. Appropriate appearance for age. Laying in bed.  HENT: Atraumatic, normocephalic. + Cervical collar--well-fitting. Eyes: Bilateral blindness-unchanged Cardiovascular: RRR, +soft flow murmur at LUSB, no rub/gallops. No Edema. Peripheral pulses 2+  Respiratory: CTAB , No rales, rhonchi, or wheezing. On RA.  Abdomen: + bowel sounds more normoactive.  Soft, nontender.  No distention.  Skin:   + PCDF site - steri strips intact, moderate serosanguinous drainage, no fluctuance or expressible discharge.  Overlying clean, dry gauze.--unchanged 1/17 + Anterior surgical site with Steri-Strips intact, no active drainage, clean and dry.- unchanged 1/19 MSK:      No apparent deformity.  Difficulty with bilateral shoulder abduction >110 degrees due to pain--improving    Neurologic exam:  AAOx3.  Follows all simple commands.  No apparent cognitive deficits.  Cranial nerves intact.  Bilateral stocking glove pattern sensory neuropathy. Strength 4 out of 5 in proximal shoulders, otherwise 5- out of 5 throughout.  No tone, spasticity, or ataxia.  Good insight into current deficits  -Unchanged 1-17       Assessment/Plan: 1. Functional deficits which require 3+ hours per day of interdisciplinary therapy in a comprehensive inpatient rehab setting. Physiatrist is providing close team supervision and 24 hour management of active medical problems listed below. Physiatrist and rehab team continue to assess barriers to discharge/monitor patient progress toward functional and medical goals  Care Tool:  Bathing    Body parts bathed by patient: Face, Chest, Abdomen   Body parts bathed by helper: Right arm, Left arm, Front perineal area, Right lower leg, Left upper leg, Right upper leg, Buttocks, Left lower leg  Bathing assist Assist Level: Maximal Assistance - Patient 24 - 49%     Upper Body Dressing/Undressing Upper body dressing   What is the patient wearing?: Hospital gown only    Upper body assist Assist Level: Maximal Assistance - Patient 25 - 49%    Lower Body Dressing/Undressing Lower body dressing      What is the patient wearing?: Incontinence brief     Lower body assist Assist for lower body dressing: Total Assistance - Patient < 25%     Toileting Toileting    Toileting assist Assist for toileting: Maximal Assistance - Patient 25 - 49%     Transfers Chair/bed  transfer  Transfers assist     Chair/bed transfer assist level: Minimal Assistance - Patient > 75%     Locomotion Ambulation   Ambulation assist      Assist level: Minimal Assistance - Patient > 75% Assistive device: Walker-rolling Max distance: 100'   Walk 10 feet activity   Assist     Assist level: Minimal Assistance - Patient > 75% Assistive device: Walker-rolling   Walk 50 feet activity   Assist Walk 50 feet with 2 turns activity did not occur: Safety/medical concerns  Assist level: Minimal Assistance - Patient > 75% Assistive device: Walker-rolling    Walk 150 feet activity   Assist Walk 150 feet activity did not occur: Safety/medical concerns         Walk 10 feet on uneven surface  activity   Assist Walk 10 feet on uneven surfaces activity did not occur: Safety/medical concerns         Wheelchair     Assist Is the patient using a wheelchair?: Yes Type of Wheelchair: Manual    Wheelchair assist level: Dependent - Patient 0%      Wheelchair 50 feet with 2 turns activity    Assist        Assist Level: Dependent - Patient 0%   Wheelchair 150 feet activity     Assist      Assist Level: Dependent - Patient 0%  Plan: Blood pressure 130/62, pulse 70, temperature 98.4 F (36.9 C), temperature source Oral, resp. rate 18, height 6' (1.829 m), weight 61.5 kg, SpO2 100%.  Medical Problem List and Plan: 1. 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 Per nursing no SLP needs.    2.  Antithrombotics: -DVT/anticoagulation:  Pharmaceutical: Heparin 5000U q8h             -antiplatelet therapy: continue aspirin 325 mg daily    3. Pain Management:  -Tylenol as needed -Norco as needed 5-10 mg q4 hours- might need Oxy if not enough- was taking IV Dilaudid solely on Acute.              -Flexeril 10 mg TID as needed   -Pain well-controlled on current regimen, continue. -1-14: Minimal use of PRNs.  Change Tylenol to 1000 mg 3 times daily as needed, reduce Norco to 5 mg every 6 hours as needed only for moderate to severe pain. 1-16: Rare use of hydrocodone, reduced to 2.5 to 5 mg twice daily as needed  and DC at discharge.  4. Mood/Behavior/Sleep: LCSW to evaluate and provide emotional support             -continue melatonin 5 mg q HS             -antipsychotic agents: n/a   - Sleeping well-- 1/19 poor sleep last night, monitor for now   5. Neuropsych/cognition: This patient is capable of making decisions on his own behalf.   6. Skin/Wound Care: Routine skin care checks             -monitor surgical incisions -1-14: Has been 1 week since postop, may remove PCDF honeycomb dressing.  Due to drainage, will have nursing take photo of this with dressing change. -- moderate serosanguinous drainage  07-25-15: Stable appearance, no further drainage on gauze, healing well   7. Fluids/Electrolytes/Nutrition: Strict Is and Os and follow-up chemistries per nephrology  - Admission labs with stable hyponatremia.  Follow q. weekly.--Stable, unchanged  8: Hypertension: monitor TID and prn -continue amlodipine 5 mg daily>> appears it was d/c'd on admission? Changed to carvedilol 3.125mg  BID             -continue lisinopril 20 mg BID  - 1-10: Mild blood pressure elevations today.  Monitor for trend, continue increase in amlodipine. -07/20/23 BPs ok, monitor for now, appears amlodipine was changed to carvedilol during admission-- no med adjustments today -07/21/23 BPs a little variable, monitor trend today with dialysis, may need adjustments tomorrow 1/14: add back norvasc 5 mg dail, add Prn hydralazine 25 mg for SBP >180, DBP  >110 1/15-16: Improving with norvasc; continue 1/17: Diastolic a little low, systolic much improved since starting Norvasc.  Monitor and can consider reduction to 2.5 mg daily if symptomatic.  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 Marisue Humble ok with this, reduced to 500mg  TID   12: DM-2: A1c = 5.8%; at home on Tresiba Flextouch 12u at bedtime             -continue SSI             -continue Semglee 2 units q HS    - 1/18-19/25 Well-controlled on current regimen  -1-20: Hypoglycemic episode this a.m.; DC Semglee and change sliding scale to sensitive Recent Labs    07/28/23 1634 07/28/23 2059 07/29/23 0529  GLUCAP 73 119* 121*     13: Constipation: continue MiraLax daily, Senokot-S BID; prns ordered-resolved  - 1-10: Positive results with enema overnight.  Increase Miralax to 17 g twice daily and Senokot 2S to 2 tabs twice daily.  Add docusate suppository for moderate constipation and enema for severe constipation.  No signs concerning for  obstruction. -07/20/23 only small BMs recently, wants to try another soap suds enema, will do that to see if we decrease stool burden.  -07/21/23 enema yesterday helped significantly, cont meds for now, if oversoftened then would back off stool regimen.  1-13:  Start bowel program as below.  Stools remain liquid, so we will DC daily MiraLAX and have Senokot as 2 hours before bowel program at night. - see #17  14: Discitis/osteomyelitis with Staph epidermidis bacteremia:  -continue vancomycin 750 mg with HD through 08/26/2023- will likely change to Chronic Doxycycline after that, but need to reach out to ID when close to d/c to verify -1/14: Seems to have missed dosed with Monday's dialysis, got additional dose today per pharmacy.   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 Dutch Quint 1-13, may remove for bathing and changing collar only.             -follow-up with Dr. Jordan Likes   16: History of prior CVA: on aspirin and statin   17. Neurogenic bowel- resolved If doesn't work, will need soap suds enema, etc- might need KUB -See #13; had positive bowel movement with enema overnight, increasing bowel regimen and may need daily bowel program if no results in next 1 to 2 days - 1/13: 2x enema with BM; discussed with patient as above, will start nightly bowel program at 8 PM with Magic bullet suppository and digital stim with no results. 1-14.  Refused bowel program overnight.  Educated patient on importance of consistency with this, he is agreeable. 1/15: Patient now having daily, continent bowel movements. All bowel meds moved to PRN.  1-16.  Patient thinks he had incontinent bowel movement overnight, not recorded per nursing, will continue to monitor and may resume bowel program if further incontinent episodes.  Of note, patient states that he did not refuse suppository, claims he was never offered it.  1/15 LBM --increased sennakot S to 2 tabs BID -07/27/23 LBM 07/25/23, reduced fosrenol as above, monitor to see if this helps with his constipation -07/28/23 multiple BMs yesterday into today, feeling better, cont  regimen for now, hopefully things start to regulate better 1/20: Continent bowel movements, monitor    LOS: 11 days A FACE TO FACE EVALUATION WAS PERFORMED  Angelina Sheriff 07/29/2023, 8:06 AM

## 2023-07-30 ENCOUNTER — Encounter (HOSPITAL_BASED_OUTPATIENT_CLINIC_OR_DEPARTMENT_OTHER): Payer: Self-pay

## 2023-07-30 LAB — GLUCOSE, CAPILLARY
Glucose-Capillary: 205 mg/dL — ABNORMAL HIGH (ref 70–99)
Glucose-Capillary: 182 mg/dL — ABNORMAL HIGH (ref 70–99)
Glucose-Capillary: 111 mg/dL — ABNORMAL HIGH (ref 70–99)
Glucose-Capillary: 162 mg/dL — ABNORMAL HIGH (ref 70–99)

## 2023-07-30 NOTE — Progress Notes (Signed)
Physical Therapy Session Note  Patient Details  Name: Shawn [name] MRN: [id number] Date of Birth: 1960/12/19  Today's Date: 07/30/2023 PT Individual Time: 0805-0915 PT Individual Time Calculation (min): 70 min   Short Term Goals: Week 2:  PT Short Term Goal 1 (Week 2): Pt will ambulate 150' with LRAD CGA PT Short Term Goal 2 (Week 2): Pt willl complete transfer with RW CGA PT Short Term Goal 3 (Week 2): Pt will maintain dynamic standing balance during functional task with supervision  Skilled Therapeutic Interventions/Progress Updates:    SESSION 1: Pt presents in room in bed, agreeable to PT. Pt reporting pain in L bicep with movement, states feeling better than yesterday. Session focused on gait training with rollator and SPC, NMR for dynamic standing balance and single limb stability as well as therapeutic exercise to promote BLE strengthening.  Pt completes bed mobility with supervision, sit to stand to rollator with supervision. Pt ambulates with CGA/min assist for steering rollator from room to day room ~100' demonstrating improved gait speed and postural stability.  Pt takes seated rest break then ambulates 175' with rollator CGA for postural stability light min assist for steering rollator for obstacle avoidance due to low vision. Pt then ambulates 50' with [facility] CGA/light min assist for postural corrections, no LOB demonstrates slow gait speed and absent arm swing LUE. Pt then completes forward/backward walking 3x10' with [facility] mod assist progress to CGA/light min assist with pt demonstrating 1 LOB backwards and to L requiring mod assist to correct, improves with each trial. Pt then completes sidestepping bilaterally 3x8' with SPC with pt demonstrating x1 LOB to L requiring mod assist to correct, progresses to CGA/light min assist with each trial.  Pt completes NMR in standing without UE support for single limb stability and dynamic standing balance including: - standing  alternating marches x20 BLE - forward step x10 BLE (CGA/min assist for task) - lateral step x10 BLE (CGA/min assist for task) - backward step x10 BLE - heel raise x10 no UE support heel raise x10 with BUE support on rollator - mini squats x10 with BUE support on rollator x10 without BUE support  Pt ambulates back to room with rollator with CGA/light min assist for steering rollator for obstacle avoidance in hallways with low vision, comes to sitting on EOB and returns to supine with supervision. Pt remains supine with all needs within reach, call light in place, and bed alarm activated at end of session.   SESSION 2: Pt presents in room in bed, agreeable to PT. Pt denies pain. Session focused on gait training for tolerance to upright and therapeutic exercise for core/BLE strengthening.  Pt completes bed mobility with supervision, completes sit>stand with supervision to rollator. Pt ambulates with rollator with CGA/light min assist from room to day room ~120'. Pt comes to sitting on EOM.  Pt completes seated therex for core/BLE strengthening including: - modified sit ups from short sitting position x10 - seated marches 4# ankle weight 2x20 - alternating LAQs 4# ankle weight 2x10 - weighted sit ups 2# med ball - scap retraction x10 - resisted hip/trunk extension yellow band x10 - resisted trunk lateral flexion yellow band x10 bilaterally  Pt provided with seated rest breaks between exercises to promote energy conservation and quality with tasks. During rest breaks pt educated on posture and positioning in sitting to decrease strain on neck.  Pt returns to room ambulating with rollator and returns to supine with all needs within reach, cal light in place and  bed alarm activated at end of session.    Therapy Documentation Precautions:  Precautions Precautions: Fall, Cervical Precaution Comments: pt blind; functional in own enviroment; cervical brace OOB to be aplied in sitting Required  Braces or Orthoses: Cervical Brace Cervical Brace: Hard collar Restrictions Weight Bearing Restrictions Per Provider Order: No    Therapy/Group: Individual Therapy  Edwin Cap PT, DPT 07/30/2023, 9:16 AM

## 2023-07-30 NOTE — Progress Notes (Signed)
PROGRESS NOTE   Subjective/Complaints: No acute complaints. No events overnight. Pain well controlled. No further issues with bowel incontinence.   ROS:  Denies fevers, chills, N/V, abdominal pain, diarrhea, SOB, cough, chest pain, new weakness or paraesthesias.    Objective:   No results found. Recent Labs    07/29/23 1028  WBC 7.4  HGB 9.2*  HCT 28.8*  PLT 432*    Recent Labs    07/29/23 1028  NA 128*  K 4.9  CL 90*  CO2 26  GLUCOSE 57*  BUN 26*  CREATININE 7.66*  CALCIUM 8.9     Intake/Output Summary (Last 24 hours) at 07/30/2023 2037 Last data filed at 07/30/2023 1800 Gross per 24 hour  Intake 713 ml  Output --  Net 713 ml     Pressure Injury 07/18/23 Sacrum Mid Stage 1 -  Intact skin with non-blanchable redness of a localized area usually over a bony prominence. (Active)  07/18/23 1630 (Assessed by Ivor Messier, LPN and Becky, RN)  Location: Sacrum  Location Orientation: Mid  Staging: Stage 1 -  Intact skin with non-blanchable redness of a localized area usually over a bony prominence.  Wound Description (Comments):   Present on Admission: Yes    Physical Exam: Vital Signs Blood pressure 136/66, pulse 84, temperature 97.6 F (36.4 C), resp. rate 18, height 6' (1.829 m), weight 59.6 kg, SpO2 98%.  Constitutional: No apparent distress. Appropriate appearance for age. Laying in bed.  HENT: Atraumatic, normocephalic. + Cervical collar--well-fitting. Eyes: Bilateral blindness-unchanged Cardiovascular: RRR, +murmur, no rub/gallops. No Edema. Peripheral pulses 2+  Respiratory: CTAB , No rales, rhonchi, or wheezing. On RA.  Abdomen: + bowel sounds more normoactive.  Soft, nontender.  No distention.  Skin:  + PCDF site - steri strips intact, moderate serosanguinous drainage, no fluctuance or expressible discharge. Overlying gauze changed - 1/21 + Anterior surgical site with Steri-Strips intact, no active  drainage, clean and dry.- unchanged 1/21 MSK:      No apparent deformity.  Difficulty with bilateral shoulder abduction >110 degrees - not retested    Neurologic exam:  AAOx3.  Follows all simple commands.  No apparent cognitive deficits.  Cranial nerves intact.  Bilateral stocking glove pattern sensory neuropathy. Strength 4 out of 5 in proximal shoulders, otherwise 5- out of 5 throughout.  No tone, spasticity, or ataxia.  Good insight into current deficits  -Unchanged 1-21       Assessment/Plan: 1. Functional deficits which require 3+ hours per day of interdisciplinary therapy in a comprehensive inpatient rehab setting. Physiatrist is providing close team supervision and 24 hour management of active medical problems listed below. Physiatrist and rehab team continue to assess barriers to discharge/monitor patient progress toward functional and medical goals  Care Tool:  Bathing    Body parts bathed by patient: Face, Chest, Abdomen   Body parts bathed by helper: Right arm, Left arm, Front perineal area, Right lower leg, Left upper leg, Right upper leg, Buttocks, Left lower leg     Bathing assist Assist Level: Maximal Assistance - Patient 24 - 49%     Upper Body Dressing/Undressing Upper body dressing   What is the patient wearing?: Hospital gown  only    Upper body assist Assist Level: Maximal Assistance - Patient 25 - 49%    Lower Body Dressing/Undressing Lower body dressing      What is the patient wearing?: Incontinence brief     Lower body assist Assist for lower body dressing: Total Assistance - Patient < 25%     Toileting Toileting    Toileting assist Assist for toileting: Maximal Assistance - Patient 25 - 49%     Transfers Chair/bed transfer  Transfers assist     Chair/bed transfer assist level: Minimal Assistance - Patient > 75%     Locomotion Ambulation   Ambulation assist      Assist level: Contact Guard/Touching assist Assistive device:  Walker-rolling Max distance: 175'   Walk 10 feet activity   Assist     Assist level: Contact Guard/Touching assist Assistive device: Walker-rolling   Walk 50 feet activity   Assist Walk 50 feet with 2 turns activity did not occur: Safety/medical concerns  Assist level: Contact Guard/Touching assist Assistive device: Walker-rolling    Walk 150 feet activity   Assist Walk 150 feet activity did not occur: Safety/medical concerns  Assist level: Contact Guard/Touching assist Assistive device: Walker-rolling    Walk 10 feet on uneven surface  activity   Assist Walk 10 feet on uneven surfaces activity did not occur: Safety/medical concerns   Assist level: Minimal Assistance - Patient > 75% Assistive device: Biochemist, clinical     Assist Is the patient using a wheelchair?: Yes Type of Wheelchair: Manual    Wheelchair assist level: Dependent - Patient 0%      Wheelchair 50 feet with 2 turns activity    Assist        Assist Level: Dependent - Patient 0%   Wheelchair 150 feet activity     Assist      Assist Level: Dependent - Patient 0%  Plan: Blood pressure 136/66, pulse 84, temperature 97.6 F (36.4 C), resp. rate 18, height 6' (1.829 m), weight 59.6 kg, SpO2 98%.  Medical Problem List and Plan: 1. 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 Per nursing no SLP needs.  - 1/21: Doing very well functionally. Progressing toward goals. Moving DC date back to Saturday if can arrange Monday with OP dialysis.    2.  Antithrombotics: -DVT/anticoagulation:  Pharmaceutical: Heparin 5000U q8h             -antiplatelet therapy: continue  aspirin 325 mg daily   3. Pain Management:  -Tylenol as needed -Norco as needed 5-10 mg q4 hours- might need Oxy if not enough- was taking IV Dilaudid solely on Acute.              -Flexeril 10 mg TID as needed   -Pain well-controlled on current regimen, continue. -1-14: Minimal use of PRNs.  Change Tylenol to 1000 mg 3 times daily as needed, reduce Norco to 5 mg every 6 hours as needed only for moderate to severe pain. 1-16: Rare use of hydrocodone, reduced to 2.5 to 5 mg twice daily as needed and DC at discharge.  4. Mood/Behavior/Sleep:  LCSW to evaluate and provide emotional support             -continue melatonin 5 mg q HS             -antipsychotic agents: n/a   - Sleeping well-- 1/19 poor sleep last night, monitor for now  1/21: improved/resovled   5. Neuropsych/cognition: This patient is capable of making decisions on his own behalf.   6. Skin/Wound Care: Routine skin care checks             -monitor surgical incisions -1-14: Has been 1 week since postop, may remove PCDF honeycomb dressing.  Due to drainage, will have nursing take photo of this with dressing change. -- moderate serosanguinous drainage  07-25-15: Stable appearance, no further drainage on gauze, healing well  1/21: Change gauze daily to reduce moisture trapping under PSLF site; no concerning features for infection   7. Fluids/Electrolytes/Nutrition: Strict Is and Os and follow-up chemistries per nephrology  - Admission labs with stable hyponatremia.  Follow q. weekly.--Stable, unchanged  8: Hypertension: monitor TID and prn -continue amlodipine 5 mg daily>> appears it was d/c'd on admission? Changed to carvedilol 3.125mg  BID             -continue lisinopril 20 mg BID  - 1-10: Mild blood pressure elevations today.  Monitor for trend, continue increase in amlodipine. -07/20/23 BPs ok, monitor for now, appears amlodipine was changed to carvedilol during admission-- no med adjustments today -07/21/23 BPs a little  variable, monitor trend today with dialysis, may need adjustments tomorrow 1/14: add back norvasc 5 mg dail, add Prn hydralazine 25 mg for SBP >180, DBP >110 1/15-16: Improving with norvasc; continue 1/17: Diastolic a little low, systolic much improved since starting Norvasc.  Monitor and can consider reduction to 2.5 mg daily if symptomatic.  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 Marisue Humble ok with this, reduced to 500mg  TID   12: DM-2: A1c = 5.8%; at home on Tresiba Flextouch 12u at bedtime             -continue SSI             -continue Semglee 2 units q HS    - 1/18-19/25 Well-controlled on current regimen  -1-20: Hypoglycemic episode this a.m.; DC Semglee and change sliding scale to sensitive  1/21: BG improved; monitor - only getting 1-2 U SSI daily currently Recent Labs    07/30/23 0650 07/30/23 1126 07/30/23 1634  GLUCAP 182* 111* 162*     13: Constipation: continue MiraLax daily, Senokot-S BID; prns ordered-resolved  - 1-10: Positive results with enema  overnight.  Increase Miralax to 17 g twice daily and Senokot 2S to 2 tabs twice daily.  Add docusate suppository for moderate constipation and enema for severe constipation.  No signs  concerning for obstruction. -07/20/23 only small BMs recently, wants to try another soap suds enema, will do that to see if we decrease stool burden.  -07/21/23 enema yesterday helped significantly, cont meds for now, if oversoftened then would back off stool regimen.  1-13: Start bowel program as below.  Stools remain liquid, so we will DC daily MiraLAX and have Senokot as 2 hours before bowel program at night. - see #17  14: Discitis/osteomyelitis with Staph epidermidis bacteremia:  -continue vancomycin 750 mg with HD through 08/26/2023- will likely change to Chronic Doxycycline after that, but need to reach out to ID when close to d/c to verify -1/14: Seems to have missed dosed with Monday's dialysis, got additional dose today per pharmacy.   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 Dutch Quint 1-13, may remove for bathing and changing collar only.             -follow-up with Dr. Jordan Likes   16: History of prior CVA: on aspirin and statin   17. Neurogenic bowel- resolved If doesn't work, will need soap suds enema, etc- might need KUB -See #13; had positive bowel movement with enema overnight, increasing bowel regimen and may need daily bowel program if no results in next 1 to 2 days - 1/13: 2x enema with BM; discussed with patient as above, will start nightly bowel program at 8 PM with Magic bullet suppository and digital stim with no results. 1-14.  Refused bowel program overnight.  Educated patient on importance of consistency with this, he is agreeable. 1/15: Patient now having daily, continent bowel movements. All bowel meds moved to PRN.  1-16.  Patient thinks he had incontinent bowel movement overnight, not recorded per nursing, will continue to monitor and may resume bowel program if further incontinent episodes.  Of note, patient states that he did  not refuse suppository, claims he was never offered it.  1/15 LBM --increased sennakot S to 2 tabs BID -07/27/23 LBM 07/25/23, reduced fosrenol as above, monitor to see if this helps with his constipation -07/28/23 multiple BMs yesterday into today, feeling better, cont regimen for now, hopefully things start to regulate better LBM 1/19; now Continent bowel movements, monitor    LOS: 12 days A FACE TO FACE EVALUATION WAS PERFORMED  Angelina Sheriff 07/30/2023, 8:37 PM

## 2023-07-30 NOTE — Progress Notes (Signed)
Physical Therapy Session Note  Patient Details  Name: Daden Monjaraz MRN: [id number] Date of Birth: 1961/04/28  Today's Date: 07/30/2023 PT Missed Time: 30 Minutes Missed Time Reason: Other (Comment) (scheduling error)   Scheduling error - pt already working with OT. 30 minutes missed of skilled physical therapy. Will make up missed minutes as pt becomes available.   Therapy Documentation Precautions:  Precautions Precautions: Fall, Cervical Precaution Comments: pt blind; functional in own enviroment; cervical brace OOB to be aplied in sitting Required Braces or Orthoses: Cervical Brace Cervical Brace: Hard collar Restrictions Weight Bearing Restrictions Per Provider Order: No   Therapy/Group: Individual Therapy Marlana Salvage Zaunegger Blima Rich PT, DPT 07/30/2023, 12:07 PM

## 2023-07-30 NOTE — Progress Notes (Signed)
  [facility] Progress Note   Subjective:   Completed dialysis yesterday - no issues.  No concerns this am. Therapy going well   Objective Vitals:   07/29/23 2025 07/30/23 0536 07/30/23 0914 07/30/23 0917  BP: (!) 169/72 117/60 138/62 138/62  Pulse: 89 74 83   Resp: 16 17    Temp:  97.9 F (36.6 C)    TempSrc:      SpO2: 100% 100%    Weight:      Height:       Physical Exam General: Well appearing man, NAD. Hard neck brace in place, blind. Heart: RRR; 3/6 murmur Lungs: CTA anteriorly Abdomen: soft Extremities: no LE edema Dialysis Access:  TDC in R chest  Additional Objective Labs: Basic Metabolic Panel: Recent Labs  Lab 07/24/23 0528 07/26/23 0920 07/29/23 1028  NA 135 128* 128*  K 4.0 4.2 4.9  CL 96* 92* 90*  CO2 30 26 26   GLUCOSE 93 142* 57*  BUN 13 18 26*  CREATININE 5.57* 6.31* 7.66*  CALCIUM 8.5* 8.6* 8.9  PHOS 3.9 3.6 3.4   Liver Function Tests: Recent Labs  Lab 07/24/23 0528 07/26/23 0920 07/29/23 1028  ALBUMIN 2.6* 2.7* 2.8*   CBC: Recent Labs  Lab 07/24/23 0528 07/26/23 0920 07/29/23 1028  WBC 5.9 5.7 7.4  HGB 8.6* 7.9* 9.2*  HCT 27.5* 25.5* 28.8*  MCV 84.4 86.1 83.5  PLT 389 357 432*   Medications:  heparin sodium (porcine)     vancomycin (VANCOCIN) 750 mg in sodium chloride 0.9 % 250 mL IVPB Stopped (07/29/23 1859)    (feeding supplement) PROSource Plus  30 mL Oral BID BM   amLODipine  5 mg Oral Daily   aspirin EC  325 mg Oral QHS   atorvastatin  80 mg Oral Daily   brimonidine  1 drop Both Eyes BID   calcitRIOL  0.5 mcg Oral Q M,W,F   carvedilol  3.125 mg Oral BID WC   Chlorhexidine Gluconate Cloth  6 each Topical Q12H   [START ON 07/31/2023] darbepoetin (ARANESP) injection - DIALYSIS  100 mcg Subcutaneous Q Wed-1800   dorzolamide-timolol  1 drop Both Eyes BID   heparin  5,000 Units Subcutaneous Q8H   insulin aspart  0-6 Units Subcutaneous TID WC   lanthanum  500 mg Oral TID WC   latanoprost  1 drop Both Eyes  QHS   lisinopril  20 mg Oral BID   melatonin  5 mg Oral QHS   senna-docusate  2 tablet Oral BID    Dialysis Orders MWF - [facility] 3:30hr, 400/A1.5, EDW 69.9kg, 2K/2.5Ca, TDC, heparin 6500 unit bolus + 2900 unit mid-run bolus - Mircera IV q 2 weeks - last 12/22 - Calcitriol 0.57mcg PO q HD   Assessment/Plan: Cervical discitis/osteomyelitis: Blood Cx + MRSE. 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 ESRD:  Continue MWF HD schedule, next HD 1/22. HTN/volume: BP controlled. Continue current regimen. Well below prior EDW, lower on d/c. 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   Secondary HPTH: Ca/Phos to goal. Lanthanum dose reduced per patient request.  Nutrition: Albumin low, continue supplements and ordered diet T2DM with retinopathy/blindness Dispo: In CIR, tentative discharge date 1/28.    Tomasa Blase PA-C [facility] 07/30/2023,12:26 PM

## 2023-07-30 NOTE — Progress Notes (Signed)
Occupational Therapy Session Note  Patient Details  Name: Shawn [name] MRN: [id number] Date of Birth: 1961/05/11  Today's Date: 07/30/2023 OT Individual Time: 7829-5621 OT Individual Time Calculation (min): 70 min    Short Term Goals: Week 2:  OT Short Term Goal 1 (Week 2): STG=LTG's based on LOS  Skilled Therapeutic Interventions/Progress Updates:   Pt seen for skilled OT session. Pt performed full am self care including bathing, UB/LB dressing and grooming integrating rollator use. Pt uses rollator at home and feels most comfortable with this device. Pt required CGA overall for all tasks in standing and amb and close s with min cues in sitting. After sink side self care, pt requested back to bed for rest and performed B UE 2 lb weights for scap, sh, elbow, forearm, wrists Bly 10 reps x 3 sets of each supine with brief rests between sets and min cues for techniques, spinal precaution integration and pacing. Left pt with all safety measures, needs and nurse call button.   Pain: B UE stiffness without pain report   Therapy Documentation Precautions:  Precautions Precautions: Fall, Cervical Precaution Comments: pt blind; functional in own enviroment; cervical brace OOB to be aplied in sitting Required Braces or Orthoses: Cervical Brace Cervical Brace: Hard collar Restrictions Weight Bearing Restrictions Per Provider Order: No   Therapy/Group: Individual Therapy  [name] 07/30/2023, 7:58 AM

## 2023-07-30 NOTE — Progress Notes (Signed)
Patient ID: Shawn [name], male   DOB: [birth year], 63 y.o.   MRN: [id number]  SW met with pt in room to provide updates from team conference, and discuss change in d/c date from 1/28 to 1/25. SW shared this is contingent on if dialysis clinic can accept him for care on Monday. SW informed him will follow-up once there is more information.  SW waiting on follow-up from [facility] Coord on answer from dialysis clinic.   Cecile Sheerer, MSW, LCSW Office: [phone] Cell: [phone] Fax: [phone]

## 2023-07-30 NOTE — Progress Notes (Signed)
Contacted by CSW regarding pt's change in d/c date. Pt is for d/c on Saturday if HD clinic can resume pt on Monday. Contacted [facility] to be advised of pt's new d/c date Saturday, 1/25. Clinic confirms pt can resume on Monday. Update provided to CSW and renal PA. Will assist as needed.   Olivia Canter Renal Navigator [phone]

## 2023-07-31 LAB — GLUCOSE, CAPILLARY
Glucose-Capillary: 141 mg/dL — ABNORMAL HIGH (ref 70–99)
Glucose-Capillary: 150 mg/dL — ABNORMAL HIGH (ref 70–99)
Glucose-Capillary: 122 mg/dL — ABNORMAL HIGH (ref 70–99)

## 2023-07-31 MED ORDER — LIDOCAINE HCL (PF) 1 % IJ SOLN
5.0000 mL | INTRAMUSCULAR | Status: DC | PRN
Start: 2023-07-31 — End: 2023-07-31

## 2023-07-31 MED ORDER — HEPARIN SODIUM (PORCINE) 1000 UNIT/ML DIALYSIS
1000.0000 [IU] | INTRAMUSCULAR | Status: DC | PRN
  Administered 2023-07-31: 3800 [IU]
  Filled 2023-07-31: qty 1

## 2023-07-31 MED ORDER — PENTAFLUOROPROP-TETRAFLUOROETH EX AERO: 1.0000 | INHALATION_SPRAY | CUTANEOUS | Status: DC | PRN

## 2023-07-31 MED ORDER — ALTEPLASE 2 MG IJ SOLR: 2.0000 mg | Freq: Once | INTRAMUSCULAR | Status: DC | PRN

## 2023-07-31 MED ORDER — ANTICOAGULANT SODIUM CITRATE 4% (200MG/5ML) IV SOLN
5.0000 mL | Status: DC | PRN
Start: 2023-07-31 — End: 2023-07-31
  Filled 2023-07-31: qty 5

## 2023-07-31 MED ORDER — LIDOCAINE-PRILOCAINE 2.5-2.5 % EX CREA
1.0000 | TOPICAL_CREAM | CUTANEOUS | Status: DC | PRN
Start: 2023-07-31 — End: 2023-07-31

## 2023-07-31 MED ORDER — HEPARIN SODIUM (PORCINE) 1000 UNIT/ML DIALYSIS
5000.0000 [IU] | INTRAMUSCULAR | Status: DC | PRN
  Administered 2023-07-31: 5000 [IU] via INTRAVENOUS_CENTRAL
  Filled 2023-07-31: qty 5

## 2023-07-31 NOTE — Progress Notes (Signed)
Physical Therapy Session Note  Patient Details  Name: Shawn [name] MRN: [id number] Date of Birth: Sep 21, 1960  Today's Date: 07/31/2023 PT Individual Time: 0920-1030 PT Individual Time Calculation (min): 70 min   Short Term Goals: Week 2:  PT Short Term Goal 1 (Week 2): Pt will ambulate 150' with LRAD CGA PT Short Term Goal 2 (Week 2): Pt willl complete transfer with RW CGA PT Short Term Goal 3 (Week 2): Pt will maintain dynamic standing balance during functional task with supervision  Skilled Therapeutic Interventions/Progress Updates:    Pt presents in room in [facility], agreeable to PT. Pt denies pain except in L lateral shoulder with shoulder abduction. Session focused on gait training for tolerance to upright as well as for dynamic standing balance as well as NMR  Pt transported from room to main gym dependently for time management. Pt then ambulates with rollator 433' CGA for activity for steering with min verbal cues, completed to promote global endurance with pt demonstrating significant improvement in consistency with cadence and stride length.  Pt ambulates 2x50' with SPC CGA light min assist when turning to L. Pt then completes gait training with focus on turns completing short distance amb 5' forward with 180* turn to left 5' forward with 180* turn to right with CGA light min assist, completed 6 trials. Pt completes NMR for dynamic standing balance, completes 2x360* turns to R/L.  Pt ambulates forward/backward 3x6' with CGA demonstrates inconsistent cadence but no LOB improved postural corrections this session. Pt completes side stepping bilaterally 3x6' with CGA. Completed both gait trials for multidirectional stepping stability.  Pt then completes NMR with 2.5# ankle weights for BLE strengthening, muscle fiber recruitment, dynamic standing balance including: - standing alternating marches BLE x20 with BUE support on rollator and x20 without UE support - standing heel raise  x20 - mini squat x5 with BUE support x5 without BUE support - standing hip abduction x10 BLE, with BUE support on rollator  Pt ambulates back to room with rollator and 2.5# ankle weights donned to promote BLE strengthening and muscular endurance, CGA for task. Pt comes to sitting in armchair where he remains with all needs within reach, call light in place at end of session.   Therapy Documentation Precautions:  Precautions Precautions: Fall, Cervical Precaution Comments: pt blind; functional in own enviroment; cervical brace OOB to be aplied in sitting Required Braces or Orthoses: Cervical Brace Cervical Brace: Hard collar Restrictions Weight Bearing Restrictions Per Provider Order: No    Therapy/Group: Individual Therapy  Edwin Cap PT, DPT 07/31/2023, 10:30 AM

## 2023-07-31 NOTE — Progress Notes (Signed)
Patient ID: Shawn [name], male   DOB: [birth year], 63 y.o.   MRN: [id number]  SW received updates from Shawn [name]/Dialysis stating dialysis center can accept him on Monday. SW updated medical team.   [phone] called pt wife Shawn [name] to inform on above, provide updates from team conference inform on HHA in place, and review discharge.   Shawn [name], MSW, LCSW Office: [phone] Cell: [phone] Fax: [phone]

## 2023-07-31 NOTE — Progress Notes (Signed)
Occupational Therapy Session Note  Patient Details  Name: Shawn [name] MRN: [id number] Date of Birth: 1961/01/26  Today's Date: 07/31/2023 OT Individual Time: 1st Session [id number], 2nd Session [id number] OT Individual Time Calculation (min): 55 min, 56 min    Short Term Goals: Week 2:  OT Short Term Goal 1 (Week 2): STG=LTG's based on LOS  Skilled Therapeutic Interventions/Progress Updates:  Session 1:  Pt seen for skilled OT session this am. Pt declines need for any self care or toileting this am. Pt bed level and moved to TIS via rollator with CGA. OT transported pt to and from ADL apt to mirror home set up and flooring for rollator use in living/bedroom, kitchen and tile bathroom. Pt able to navigate with OT giving verbal cues for blindness and CGA for amb to kitchen table, rollator sitting with brake locking with pt with + recall as pt uses daily in his home. Pt then amb to EOB with pt confirming same bed height at home and off and on with CGA/S only. Pt amb into bathroom to sink side and sat on rollator to simulate sink side self care. Pt amb back to TIS and able to rest back in TIS in room and left with all needs and safety measures in reach.   Pain:denies any pain this session    Session 2:  Pt seen for skilled OT session with focus on UE there activity and ex. Pt in regular arm chair upon OT arrival. Was able to amb 20 ft to and from TIS to transport to day room with close S and min cues for directionality. Pt transported to and from day room gym for table top activity. Med box planner opening and closing flip tops x 21 with increased time only. Placed 1/2" beads in and out of all slots with increased time. Able to complete 1" circular 25 disc flips across the table with increased time only Bly. Pt reported fatigue from back to back am therapies. Once in room, amb to bed and set up with all needs and safety measures with hand off to NT for CBG.    Pain: denies any pain this  session   Therapy Documentation Precautions:  Precautions Precautions: Fall, Cervical Precaution Comments: pt blind; functional in own enviroment; cervical brace OOB to be aplied in sitting Required Braces or Orthoses: Cervical Brace Cervical Brace: Hard collar Restrictions Weight Bearing Restrictions Per Provider Order: No  Therapy/Group: Individual Therapy  [name] 07/31/2023, 7:53 AM

## 2023-07-31 NOTE — Procedures (Signed)
Received patient in bed to unit.  Alert and oriented.  Informed consent signed and in chart.   TX duration: 3.5 hours  Patient tolerated well.  Transported back to the room  Alert, without acute distress.  Hand-off given to patient's nurse.   Access used: [email or web address] Access issues: none  Total UF removed: 2 liters  Lu Duffel, RN [facility]

## 2023-07-31 NOTE — Progress Notes (Signed)
  [facility] Progress Note   Subjective:    Seen in room. No new complaints. Very happy progress in  therapy. For HD today.   Objective Vitals:   07/31/23 0459 07/31/23 0500 07/31/23 0903 07/31/23 0904  BP: (!) 151/68  129/65 129/65  Pulse: 81  82 82  Resp: 18     Temp: 98.1 F (36.7 C)     TempSrc:      SpO2: 100%     Weight:  62.1 kg    Height:       Physical Exam General: Well appearing man, NAD. Hard neck brace in place, blind. Heart: RRR; 3/6 murmur Lungs: CTA anteriorly Abdomen: soft Extremities: no LE edema Dialysis Access:  TDC in R chest  Additional Objective Labs: Basic Metabolic Panel: Recent Labs  Lab 07/26/23 0920 07/29/23 1028  NA 128* 128*  K 4.2 4.9  CL 92* 90*  CO2 26 26  GLUCOSE 142* 57*  BUN 18 26*  CREATININE 6.31* 7.66*  CALCIUM 8.6* 8.9  PHOS 3.6 3.4   Liver Function Tests: Recent Labs  Lab 07/26/23 0920 07/29/23 1028  ALBUMIN 2.7* 2.8*   CBC: Recent Labs  Lab 07/26/23 0920 07/29/23 1028  WBC 5.7 7.4  HGB 7.9* 9.2*  HCT 25.5* 28.8*  MCV 86.1 83.5  PLT 357 432*   Medications:  anticoagulant sodium citrate     heparin sodium (porcine)     vancomycin (VANCOCIN) 750 mg in sodium chloride 0.9 % 250 mL IVPB Stopped (07/29/23 1859)    (feeding supplement) PROSource Plus  30 mL Oral BID BM   amLODipine  5 mg Oral Daily   aspirin EC  325 mg Oral QHS   atorvastatin  80 mg Oral Daily   brimonidine  1 drop Both Eyes BID   calcitRIOL  0.5 mcg Oral Q M,W,F   carvedilol  3.125 mg Oral BID WC   Chlorhexidine Gluconate Cloth  6 each Topical Q12H   darbepoetin (ARANESP) injection - DIALYSIS  100 mcg Subcutaneous Q Wed-1800   dorzolamide-timolol  1 drop Both Eyes BID   heparin  5,000 Units Subcutaneous Q8H   insulin aspart  0-6 Units Subcutaneous TID WC   lanthanum  500 mg Oral TID WC   latanoprost  1 drop Both Eyes QHS   lisinopril  20 mg Oral BID   melatonin  5 mg Oral QHS   senna-docusate  2 tablet Oral BID     Dialysis Orders MWF - [facility] 3:30hr, 400/A1.5, EDW 69.9kg, 2K/2.5Ca, TDC, heparin 6500 unit bolus + 2900 unit mid-run bolus - Mircera IV q 2 weeks - last 12/22 - Calcitriol 0.89mcg PO q HD   Assessment/Plan: Cervical discitis/osteomyelitis: Blood Cx + MRSE. 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 ESRD:  Continue MWF HD schedule, next HD 1/22. HTN/volume: BP controlled. Continue current regimen. Well below prior EDW, lower on d/c. 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   Secondary HPTH: Ca/Phos to goal. Lanthanum dose reduced per patient request.  Nutrition: Albumin low, continue supplements and ordered diet T2DM with retinopathy/blindness Dispo: In CIR, Discharge date 1/25.    Tomasa Blase PA-C [facility] 07/31/2023,12:30 PM

## 2023-07-31 NOTE — Progress Notes (Signed)
PROGRESS NOTE   Subjective/Complaints: No acute complaints. No events overnight.  Pain well-controlled on current regimen.  Some mild symptoms in the back of his neck, but states these are doing okay. No fevers, chills, nausea, or dizziness. Sleeping well, eating well.  ROS:  Denies fevers, chills, N/V, abdominal pain, diarrhea, SOB, cough, chest pain, new weakness or paraesthesias.    Objective:   No results found. Recent Labs    07/29/23 1028  WBC 7.4  HGB 9.2*  HCT 28.8*  PLT 432*    Recent Labs    07/29/23 1028  NA 128*  K 4.9  CL 90*  CO2 26  GLUCOSE 57*  BUN 26*  CREATININE 7.66*  CALCIUM 8.9     Intake/Output Summary (Last 24 hours) at 07/31/2023 0806 Last data filed at 07/31/2023 9518 Gross per 24 hour  Intake 953 ml  Output --  Net 953 ml     Pressure Injury 07/18/23 Sacrum Mid Stage 1 -  Intact skin with non-blanchable redness of a localized area usually over a bony prominence. (Active)  07/18/23 1630 (Assessed by Ivor Messier, LPN and Becky, RN)  Location: Sacrum  Location Orientation: Mid  Staging: Stage 1 -  Intact skin with non-blanchable redness of a localized area usually over a bony prominence.  Wound Description (Comments):   Present on Admission: Yes    Physical Exam: Vital Signs Blood pressure (!) 151/68, pulse 81, temperature 98.1 F (36.7 C), resp. rate 18, height 6' (1.829 m), weight 62.1 kg, SpO2 100%.  Constitutional: No apparent distress. Appropriate appearance for age.  Sitting up in wheelchair.   HENT: Atraumatic, normocephalic. + Cervical collar--well-fitting. Eyes: Bilateral blindness-unchanged Cardiovascular: RRR, +murmur, no rub/gallops. No Edema. Peripheral pulses 2+  Respiratory: CTAB , No rales, rhonchi, or wheezing. On RA.  Abdomen: + bowel sounds more normoactive.  Soft, nontender.  No distention.  Skin:  + PCDF site -overlying gauze from 1-21 intact, no apparent  drainage + Anterior surgical site with Steri-Strips intact, no active drainage, clean and dry.- unchanged 1/ 22 MSK:      No apparent deformity.  Difficulty with bilateral shoulder abduction >110 degrees - not retested    Neurologic exam:  AAOx3.  Follows all simple commands.  No apparent cognitive deficits.  Cranial nerves intact.  Bilateral stocking glove pattern sensory neuropathy. Strength 5- out of 5 throughout.   No tone, spasticity, or ataxia.   Good insight into current deficits  -Unchanged 1-22       Assessment/Plan: 1. Functional deficits which require 3+ hours per day of interdisciplinary therapy in a comprehensive inpatient rehab setting. Physiatrist is providing close team supervision and 24 hour management of active medical problems listed below. Physiatrist and rehab team continue to assess barriers to discharge/monitor patient progress toward functional and medical goals  Care Tool:  Bathing    Body parts bathed by patient: Face, Chest, Abdomen   Body parts bathed by helper: Right arm, Left arm, Front perineal area, Right lower leg, Left upper leg, Right upper leg, Buttocks, Left lower leg     Bathing assist Assist Level: Maximal Assistance - Patient 24 - 49%     Upper Body Dressing/Undressing  Upper body dressing   What is the patient wearing?: Hospital gown only    Upper body assist Assist Level: Maximal Assistance - Patient 25 - 49%    Lower Body Dressing/Undressing Lower body dressing      What is the patient wearing?: Incontinence brief     Lower body assist Assist for lower body dressing: Total Assistance - Patient < 25%     Toileting Toileting    Toileting assist Assist for toileting: Maximal Assistance - Patient 25 - 49%     Transfers Chair/bed transfer  Transfers assist     Chair/bed transfer assist level: Minimal Assistance - Patient > 75%     Locomotion Ambulation   Ambulation assist      Assist level: Contact  Guard/Touching assist Assistive device: Walker-rolling Max distance: 175'   Walk 10 feet activity   Assist     Assist level: Contact Guard/Touching assist Assistive device: Walker-rolling   Walk 50 feet activity   Assist Walk 50 feet with 2 turns activity did not occur: Safety/medical concerns  Assist level: Contact Guard/Touching assist Assistive device: Walker-rolling    Walk 150 feet activity   Assist Walk 150 feet activity did not occur: Safety/medical concerns  Assist level: Contact Guard/Touching assist Assistive device: Walker-rolling    Walk 10 feet on uneven surface  activity   Assist Walk 10 feet on uneven surfaces activity did not occur: Safety/medical concerns   Assist level: Minimal Assistance - Patient > 75% Assistive device: Biochemist, clinical     Assist Is the patient using a wheelchair?: Yes Type of Wheelchair: Manual    Wheelchair assist level: Dependent - Patient 0%      Wheelchair 50 feet with 2 turns activity    Assist        Assist Level: Dependent - Patient 0%   Wheelchair 150 feet activity     Assist      Assist Level: Dependent - Patient 0%  Plan: Blood pressure (!) 151/68, pulse 81, temperature 98.1 F (36.7 C), resp. rate 18, height 6' (1.829 m), weight 62.1 kg, SpO2 100%.  Medical Problem List and Plan: 1. 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 Per nursing no SLP needs.  - 1/21: Doing very well functionally. Progressing toward goals. Moving DC date back to Saturday if can arrange Monday with OP dialysis.    2.  Antithrombotics: -DVT/anticoagulation:  Pharmaceutical: Heparin 5000U q8h              -antiplatelet therapy: continue aspirin 325 mg daily   3. Pain Management:  -Tylenol as needed -Norco as needed 5-10 mg q4 hours- might need Oxy if not enough- was taking IV Dilaudid solely on Acute.              -Flexeril 10 mg TID as needed   -Pain well-controlled on current regimen, continue. -1-14: Minimal use of PRNs.  Change Tylenol to 1000 mg 3 times daily as needed, reduce Norco to 5 mg every 6 hours as needed only for moderate to severe pain. 1-16: Rare use of hydrocodone, reduced to 2.5 to  5 mg twice daily as needed and DC at discharge.  4. Mood/Behavior/Sleep: LCSW to evaluate and provide emotional support             -continue melatonin 5 mg q HS             -antipsychotic agents: n/a   - Sleeping well-- 1/19 poor sleep last night, monitor for now  1/21: improved/resovled   5. Neuropsych/cognition: This patient is capable of making decisions on his own behalf.   6. Skin/Wound Care: Routine skin care checks             -monitor surgical incisions -1-14: Has been 1 week since postop, may remove PCDF honeycomb dressing.  Due to drainage, will have nursing take photo of this with dressing change. -- moderate serosanguinous drainage  07-25-15: Stable appearance, no further drainage on gauze, healing well  1/21: Change gauze daily to reduce moisture trapping under PSLF site; no concerning features for infection--staying clean 1-22, ensure daily changes.   7. Fluids/Electrolytes/Nutrition: Strict Is and Os and follow-up chemistries per nephrology  - Admission labs with stable hyponatremia.  Follow q. weekly.--Stable, unchanged  8: Hypertension: monitor TID and prn -continue amlodipine 5 mg daily>> appears it was d/c'd on admission? Changed to carvedilol 3.125mg  BID             -continue lisinopril 20 mg BID  - 1-10: Mild blood pressure elevations today.  Monitor for trend, continue increase in amlodipine. -07/20/23 BPs ok, monitor for now, appears amlodipine was changed to  carvedilol during admission-- no med adjustments today -07/21/23 BPs a little variable, monitor trend today with dialysis, may need adjustments tomorrow 1/14: add back norvasc 5 mg dail, add Prn hydralazine 25 mg for SBP >180, DBP >110 1/15-16: Improving with norvasc; continue 1/17: Diastolic a little low, systolic much improved since starting Norvasc.  Monitor and can consider reduction to 2.5 mg daily if symptomatic.  - BPs great, monitor on current regimen - elevated 1/20 but resolved post-dialysis--continues to be better 1-22 Vitals:   07/31/23 1438 07/31/23 1500 07/31/23 1530 07/31/23 1600  BP: 135/64 (!) 140/66 (!) 171/74 (!) 164/74   07/31/23 1630 07/31/23 1700 07/31/23 1730 07/31/23 1800  BP: 139/68 133/68 135/64 129/66   07/31/23 1812 07/31/23 1819 07/31/23 1856 07/31/23 2102  BP: 135/67 139/63 (!) 141/67 (!) 141/67     9: Hyperlipidemia: continue atorvastatin 80mg  daily   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, Cosopt, Xalantan gtts   11: ESRD: HD>>M,W,F via right internal jugular TDC             -continue calcitrol 0.5 mcg M,W,F             -continue Aranesp 60 mcg every Wednesday             -continue Fosrenol 1,000 mg TID with meals -07/27/23 pt requesting fosrenol reduction d/t constipation, Dr. Marisue Humble ok with this, reduced to 500mg  TID   12: DM-2: A1c = 5.8%; at home on Tresiba Flextouch 12u at bedtime             -continue SSI             -continue Semglee 2 units q HS    - 1/18-19/25 Well-controlled on current regimen  -1-20: Hypoglycemic episode this a.m.; DC Semglee and change sliding scale to sensitive  1/21: BG improved; monitor - only getting 1-2 U SSI daily currently; can come off SSI at discharge Recent Labs    07/30/23 0650 07/30/23 1126 07/30/23  1634  GLUCAP 182* 111* 162*     13: Constipation: continue MiraLax daily, Senokot-S BID; prns ordered-resolved  - 1-10: Positive results with enema overnight.  Increase Miralax to 17 g twice daily and  Senokot 2S to 2 tabs twice daily.  Add docusate suppository for moderate constipation and enema for severe constipation.  No signs concerning for obstruction. -07/20/23 only small BMs recently, wants to try another soap suds enema, will do that to see if we decrease stool burden.  -07/21/23 enema yesterday helped significantly, cont meds for now, if oversoftened then would back off stool regimen.  1-13: Start bowel program as below.  Stools remain liquid, so we will DC daily MiraLAX and have Senokot as 2 hours before bowel program at night. - see #17  14: Discitis/osteomyelitis with Staph epidermidis bacteremia:  -continue vancomycin 750 mg with HD through 08/26/2023- will likely change to Chronic Doxycycline after that, but need to reach out to ID when close to d/c to verify -1/14: Seems to have missed dosed with Monday's dialysis, got additional dose today per pharmacy.   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 Dutch Quint 1-13, may remove for bathing and changing collar only.             -follow-up with Dr. Jordan Likes  -1-22: Patient asking about cervical collar clearance.  Advised this usually takes at least 6 weeks, then outpatient follow-up.  Emphasized need for outpatient follow-up.   16: History of prior CVA: on aspirin and statin   17. Neurogenic bowel- resolved If doesn't work, will need soap suds enema, etc- might need KUB -See #13; had positive bowel movement with enema overnight, increasing bowel regimen and may need daily bowel program if no results in next 1 to 2 days - 1/13: 2x enema with BM; discussed with patient as above, will start nightly bowel program at 8 PM with Magic bullet suppository and digital stim with no results. 1-14.  Refused bowel program overnight.  Educated patient on importance of consistency with this, he is  agreeable. 1/15: Patient now having daily, continent bowel movements. All bowel meds moved to PRN.  1-16.  Patient thinks he had incontinent bowel movement overnight, not recorded per nursing, will continue to monitor and may resume bowel program if further incontinent episodes.  Of note, patient states that he did not refuse suppository, claims he was never offered it.  1/15 LBM --increased sennakot S to 2 tabs BID -07/27/23 LBM 07/25/23, reduced fosrenol as above, monitor to see if this helps with his constipation -07/28/23 multiple BMs yesterday into today, feeling better, cont regimen for now, hopefully things start to regulate better LBM 1/19; now Continent bowel movements, monitor    LOS: 13 days A FACE TO FACE EVALUATION WAS PERFORMED  Angelina Sheriff 07/31/2023, 8:06 AM

## 2023-08-01 LAB — GLUCOSE, CAPILLARY
Glucose-Capillary: 140 mg/dL — ABNORMAL HIGH (ref 70–99)
Glucose-Capillary: 140 mg/dL — ABNORMAL HIGH (ref 70–99)
Glucose-Capillary: 174 mg/dL — ABNORMAL HIGH (ref 70–99)
Glucose-Capillary: 205 mg/dL — ABNORMAL HIGH (ref 70–99)

## 2023-08-01 NOTE — Progress Notes (Signed)
Pt refused suppository at this time. He states he would like to wait and see if previously administered stool softeners will give him some results.

## 2023-08-01 NOTE — Progress Notes (Signed)
Physical Therapy Session Note  Patient Details  Name: Shawn [name] MRN: [id number] Date of Birth: 1961/03/24  Today's Date: 08/01/2023 PT Individual Time: 0920-1045 PT Individual Time Calculation (min): 85 min   Short Term Goals: Week 2:  PT Short Term Goal 1 (Week 2): Pt will ambulate 150' with LRAD CGA PT Short Term Goal 2 (Week 2): Pt willl complete transfer with RW CGA PT Short Term Goal 3 (Week 2): Pt will maintain dynamic standing balance during functional task with supervision  Skilled Therapeutic Interventions/Progress Updates:    Pt presents in room in bed, agreeable to PT. Pt reporting headache states premedicated. Session focused on gait training for tolerance to upright and dynamic standing balance as well as therapeutic exercise to promote BLE strengthening and muscular endurance, and tolerance to upright.  Pt completes bed mobility with supervision, set up assist for donning shoes seated EOB. Pt completes sit<>stand with supervision to rollator. Pt ambulates with rollator with CGA for steering rollator to main gym ~200' with min verbal cues for direction. Pt ambulates 3x40' with SPC with CGA, pt uses L HHA for task as pt reports using LUE at home to navigate environment, pt demonstrating improved postural stability compared to previous sessions.  Pt completes standing therex with 2.5# ankle weights to promote BLE strengthening and dynamic standing balance including: - standing alternating marches BUE support x30 wihtou BUE support x20 - standing heel raise x20 BUE support - standing hip abduction BUE support x10 BLE  Pt transported to day room via WC, completes stand step transfer to nustep with CGA. Pt completes continuous training on nustep BLEs only, at workload 4 for 10 minutes total, maintains 24 spm consistently throughout exercise for a total of 249 steps. Completed to promote BLE muscular strengthening and endurance.  Pt completes seated therex for core  strengthening needed for postural stability, bed mobility, transfers including: - weighted modified sit ups x15 2# weighted ball - resisted hip extension yellow band x10 - resisted lateral flexion yellow band x10 bilaterally  Pt provided with seated rest breaks between all gait trials and exercises to promote energy conservation and quality with tasks. During rest break pt educated on DC planning.  Pt handoff to OT at end of session seated in WC in day room.   Therapy Documentation Precautions:  Precautions Precautions: Fall, Cervical Precaution Comments: pt blind; functional in own enviroment; cervical brace OOB to be aplied in sitting Required Braces or Orthoses: Cervical Brace Cervical Brace: Hard collar Restrictions Weight Bearing Restrictions Per Provider Order: No   Therapy/Group: Individual Therapy  Edwin Cap PT, DPT 08/01/2023, 12:42 PM

## 2023-08-01 NOTE — Progress Notes (Signed)
Occupational Therapy Session Note  Patient Details  Name: Shawn [name] MRN: [id number] Date of Birth: Feb 14, 1961  Today's Date: 08/01/2023 OT Individual Time: 1045-1200, Attempted visit with fatigue thus missed 30 min  OT Individual Time Calculation (min): 75 min    Short Term Goals: Week 2:  OT Short Term Goal 1 (Week 2): STG=LTG's based on LOS  Skilled Therapeutic Interventions/Progress Updates:   Session 1:  Pt seen for skilled OT session with handoff from PT in therapy gym. Pt eager to work on table top UE strength and coord activity due to fatigue after mobility. Pt completed 3 sets of 10 reps towel slides with 4 lb weight lying on top for scap protraction/retraction, and horizontal add/abd B sh. Rest between sets. Pt then completed 50 varying resistance clips on dowel B Ly then removed with increased functional times. Pt transported to room and OT placed TIS outside door and pt amb to and from commode with close S and min cues for directionality then to bed for rest with close s and to doff shoes. In supine OT relaced soiled Aspen neck collar pads and pt able to clean and dry neck. Left pt supine with bed alarm engaged, needs and nurse call button in reach.    Pain: denies all pain    Session 2:  Will attempt to make up visit as schedule allows as OT arrived and pt asleep and declined final visit of day.   08/01/23 1535  OT Missed Time  OT Amount of Missed Time 30 Minutes  OT Missed Time Reason Patient fatigue    Therapy Documentation Precautions:  Precautions Precautions: Fall, Cervical Precaution Comments: pt blind; functional in own enviroment; cervical brace OOB to be aplied in sitting Required Braces or Orthoses: Cervical Brace Cervical Brace: Hard collar Restrictions Weight Bearing Restrictions Per Provider Order: No   Therapy/Group: Individual Therapy  [name] 08/01/2023, 7:43 AM

## 2023-08-01 NOTE — Progress Notes (Signed)
Patient ID: Shawn [name], male   DOB: 10-19-1960, 63 y.o.   MRN: [id number]  Pt d/c date moved to 1/26.   61- SW spoke with pt wife Eric Form to inform on change in d/c date. No concerns reported by pt wife.   SW updated French Ana Mounce/Dialysis Coord and Angie/[facility].   Cecile Sheerer, MSW, LCSW Office: [phone] Cell: [phone] Fax: [phone]

## 2023-08-01 NOTE — Progress Notes (Signed)
  [facility] Progress Note   Subjective:    Dialysis yesterday net UF 2L - no issues.  No new events. Looking forward to discharge   Objective Vitals:   07/31/23 1819 07/31/23 1856 07/31/23 2102 08/01/23 0519  BP: 139/63 (!) 141/67 (!) 141/67 (!) 143/60  Pulse: 84 84  72  Resp: 14 18  18   Temp:  98.3 F (36.8 C)  97.8 F (36.6 C)  TempSrc:      SpO2: 100% 100%  100%  Weight: 60.1 kg   62.6 kg  Height:       Physical Exam General: Well appearing man, NAD. Hard neck brace in place, blind. Heart: RRR; 3/6 murmur Lungs: CTA anteriorly Abdomen: soft Extremities: no LE edema Dialysis Access:  TDC in R chest  Additional Objective Labs: Basic Metabolic Panel: Recent Labs  Lab 07/26/23 0920 07/29/23 1028  NA 128* 128*  K 4.2 4.9  CL 92* 90*  CO2 26 26  GLUCOSE 142* 57*  BUN 18 26*  CREATININE 6.31* 7.66*  CALCIUM 8.6* 8.9  PHOS 3.6 3.4   Liver Function Tests: Recent Labs  Lab 07/26/23 0920 07/29/23 1028  ALBUMIN 2.7* 2.8*   CBC: Recent Labs  Lab 07/26/23 0920 07/29/23 1028  WBC 5.7 7.4  HGB 7.9* 9.2*  HCT 25.5* 28.8*  MCV 86.1 83.5  PLT 357 432*   Medications:  heparin sodium (porcine)     vancomycin (VANCOCIN) 750 mg in sodium chloride 0.9 % 250 mL IVPB 750 mg (07/31/23 1904)    (feeding supplement) PROSource Plus  30 mL Oral BID BM   amLODipine  5 mg Oral Daily   aspirin EC  325 mg Oral QHS   atorvastatin  80 mg Oral Daily   brimonidine  1 drop Both Eyes BID   calcitRIOL  0.5 mcg Oral Q M,W,F   carvedilol  3.125 mg Oral BID WC   Chlorhexidine Gluconate Cloth  6 each Topical Q12H   darbepoetin (ARANESP) injection - DIALYSIS  100 mcg Subcutaneous Q Wed-1800   dorzolamide-timolol  1 drop Both Eyes BID   heparin  5,000 Units Subcutaneous Q8H   insulin aspart  0-6 Units Subcutaneous TID WC   lanthanum  500 mg Oral TID WC   latanoprost  1 drop Both Eyes QHS   lisinopril  20 mg Oral BID   melatonin  5 mg Oral QHS   senna-docusate   2 tablet Oral BID    Dialysis Orders MWF - [facility] 3:30hr, 400/A1.5, EDW 69.9kg, 2K/2.5Ca, TDC, heparin 6500 unit bolus + 2900 unit mid-run bolus - Mircera IV q 2 weeks - last 12/22 - Calcitriol 0.51mcg PO q HD   Assessment/Plan: Cervical discitis/osteomyelitis: Blood Cx + MRSE. 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 ESRD:  Continue MWF HD schedule, next HD 1/24. HTN/volume: BP controlled. Continue current regimen. Well below prior EDW, lower on d/c. 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 Lanthanum dose reduced per patient request.  Nutrition: Albumin low, continue supplements and ordered diet T2DM with retinopathy/blindness Dispo: In CIR, Discharge date 1/26.   Tomasa Blase PA-C [facility] 08/01/2023,1:03 PM

## 2023-08-01 NOTE — Patient Care Conference (Addendum)
Inpatient RehabilitationTeam Conference and Plan of Care Update Date: 07/30/2023   Time: 1036    Patient Name: Shawn [name]      Medical Record Number: [id number]  Date of Birth: [birth year] Sex: Male         Room/Bed: 4W08C/4W08C-01 Payor Info: Payor: MEDICARE / Plan: MEDICARE PART A AND B / Product Type: *No Product type* /    Admit Date/Time:  07/18/2023  3:14 PM  Primary Diagnosis:  Osteomyelitis of cervical spine [facility])  Hospital Problems: Principal Problem:   Osteomyelitis of cervical spine [facility])    Expected Discharge Date: Expected Discharge Date: 08/03/23  Team Members Present: Physician leading conference: Dr. Elijah Birk Nurse Present: Chana Bode, Janina Mayo, RN PT Present: Darrold Span, PT OT Present: Valetta Fuller, OT PPS Coordinator present : Fae Pippin, SLP     Current Status/Progress Goal Weekly Team Focus  Bowel/Bladder   (P) pt continent of stool and oliguri     Continent bowel     Assess bowels q shift using medications  Swallow/Nutrition/ Hydration               ADL's   set up and Supervision UB, CGA with figure 4 techniques for socks, shoes as well as threading pants over feet, CGA for transfers to commode, standing sink side with CGA to perform oral care with RW, UE HEP with theraputty, light weights and light tband training with wife   Supervision   Family Education completed with wife, progressing standing and amb transfers to commode with RW, HHOT neds only    Mobility   supervision bed mobility, CGA/min assist for transfers and gait with RW 175'   supervision ambulatory  barriers: low vision, endurance; continue focus on gait training, NMR for dynamic standing balance, strengthening/endurance    Communication                Safety/Cognition/ Behavioral Observations               Pain      N/a          Skin      N/a           Discharge Planning:  Pt will d/c to home with his wife who wil be primary  caregiver.  Scheduled fam edu on Monday (1/20) 10am-12pm with pt wife. [facility] for HHPT/OT/aide. SW will confirm there are no barriers to discharge.   Team Discussion: Patient was admitted post cervical disc disorder with myelopathy.  Patient is on hemodialysis with no problems noted. Blood pressure was low but medications are adjusted.   Patient on target to meet rehab goals: yes, patient is supervision on upper body function, contact guard on lower body function and using contact guard for commode transfers. Needs contact guard assist for transfers and ambulation using a rollator.  Overall goals set for supervision.   *See Care Plan and progress notes for long and short-term goals.   Revisions to Treatment Plan:  none  Teaching Needs: Medications, safety, transfers,toileting, etc  Current Barriers to Discharge: Home enviroment access/layout and Hemodialysis  Possible Resolutions to Barriers: Family education Home health follow up     Medical Summary Current Status: medically complicated by hypertension, ESRD, improving neurogenic bowel, and osteomyelitis on vancomycin  Barriers to Discharge: Inadequate Nutritional Intake;Self-care education;Uncontrolled Diabetes;Uncontrolled Hypertension;Uncontrolled Pain;Neurogenic Bowel & Bladder;Infection/IV Antibiotics   Possible Resolutions to [employer] Focus: Monitoring of labs with dialysis and on vancomycin, adjusting diabetes and blood pressure medications, titrating pain medications  to minimal tolerable dosing, encouraging p.o. intakes   Continued Need for Acute Rehabilitation Level of Care: The patient requires daily medical management by a physician with specialized training in physical medicine and rehabilitation for the following reasons: Direction of a multidisciplinary physical rehabilitation program to maximize functional independence : Yes Medical management of patient stability for increased activity during  participation in an intensive rehabilitation regime.: Yes Analysis of laboratory values and/or radiology reports with any subsequent need for medication adjustment and/or medical intervention. : Yes   I attest that I was present, lead the team conference, and concur with the assessment and plan of the team.   Gwenyth Allegra 08/01/2023, 9:23 AM

## 2023-08-01 NOTE — Progress Notes (Signed)
Physical Therapy Session Note  Patient Details  Name: Shawn [name] MRN: [id number] Date of Birth: 12-28-1960  Today's Date: 08/01/2023 PT Individual Time: 4098-1191 PT Individual Time Calculation (min): 41 min   Short Term Goals: Week 2:  PT Short Term Goal 1 (Week 2): Pt will ambulate 150' with LRAD CGA PT Short Term Goal 2 (Week 2): Pt willl complete transfer with RW CGA PT Short Term Goal 3 (Week 2): Pt will maintain dynamic standing balance during functional task with supervision  Skilled Therapeutic Interventions/Progress Updates:      Therapy Documentation Precautions:  Precautions Precautions: Fall, Cervical Precaution Comments: pt blind; functional in own enviroment; cervical brace OOB to be aplied in sitting Required Braces or Orthoses: Cervical Brace Cervical Brace: Hard collar Restrictions Weight Bearing Restrictions Per Provider Order: No  Pt agreeable to PT session with emphasis on dynamic gait along with balance/coordination training. Pt without verbal reports of pain and supervision with bed mobility transfers and CGA with gait with assist for steering rollator to guide due to visual impairments. Pt practiced ambulation with grocery cart to simulate activity as he performs frequently with family, pt demonstrates safe technique. Transitioned to dynamic gait and NMR training as pt performed forward, lateral, and backward stepping in addition to gait with emphasis on increasing/decreasing speed. Pt returned to room with ambulation (S) and left semi-reclined in bed with all needs in reach, alarm on.    Therapy/Group: Individual Therapy  Truitt Leep Truitt Leep PT, DPT  08/01/2023, 3:54 PM

## 2023-08-01 NOTE — Progress Notes (Addendum)
PROGRESS NOTE   Subjective/Complaints: No acute complaints. No events overnight.  Vital stable, blood pressure remaining in a better range. Labs approximately stable from last dialysis. Eating well, sleeping well, some intermittent mild neck pain but overall symptoms are well-controlled.  ROS:  Denies fevers, chills, N/V, abdominal pain, diarrhea, SOB, cough, chest pain, new weakness or paraesthesias. _ neck pain - well controlled    Objective:   No results found. Recent Labs    07/29/23 1028  WBC 7.4  HGB 9.2*  HCT 28.8*  PLT 432*    Recent Labs    07/29/23 1028  NA 128*  K 4.9  CL 90*  CO2 26  GLUCOSE 57*  BUN 26*  CREATININE 7.66*  CALCIUM 8.9     Intake/Output Summary (Last 24 hours) at 08/01/2023 0936 Last data filed at 08/01/2023 0836 Gross per 24 hour  Intake 477 ml  Output 2000 ml  Net -1523 ml     Pressure Injury 07/18/23 Sacrum Mid Stage 1 -  Intact skin with non-blanchable redness of a localized area usually over a bony prominence. (Active)  07/18/23 1630 (Assessed by Ivor Messier, LPN and Becky, RN)  Location: Sacrum  Location Orientation: Mid  Staging: Stage 1 -  Intact skin with non-blanchable redness of a localized area usually over a bony prominence.  Wound Description (Comments):   Present on Admission: Yes    Physical Exam: Vital Signs Blood pressure (!) 143/60, pulse 72, temperature 97.8 F (36.6 C), resp. rate 18, height 6' (1.829 m), weight 62.6 kg, SpO2 100%.  Constitutional: No apparent distress. Appropriate appearance for age.  Sitting in therapy gym.   HENT: Atraumatic, normocephalic. + Cervical collar--well-fitting. Eyes: Bilateral blindness-unchanged Cardiovascular: RRR, +murmur, no rub/gallops. No Edema. Peripheral pulses 2+  Respiratory: CTAB , No rales, rhonchi, or wheezing. On RA.  Abdomen: + bowel sounds more normoactive.  Soft, nontender.  No distention.  Skin:  + PCDF  site -no drainage on overlying gauze, removed, dry serosanguineous fluid with intact Steri-Strips.  Well-approximated.   + Anterior surgical site with Steri-Strips intact, no active drainage, clean and dry.- unchanged 1/ 23 MSK:      No apparent deformity.  Difficulty with bilateral shoulder abduction >110 degrees -improving tolerance although range of motion remains reduced    Neurologic exam:  AAOx3.  Follows all simple commands.  No apparent cognitive deficits.  Cranial nerves intact.  Bilateral stocking glove pattern sensory neuropathy. Strength 5- out of 5 throughout.   No tone, spasticity, or ataxia.   Good insight into current deficits  -Unchanged 1-23       Assessment/Plan: 1. Functional deficits which require 3+ hours per day of interdisciplinary therapy in a comprehensive inpatient rehab setting. Physiatrist is providing close team supervision and 24 hour management of active medical problems listed below. Physiatrist and rehab team continue to assess barriers to discharge/monitor patient progress toward functional and medical goals  Care Tool:  Bathing    Body parts bathed by patient: Face, Chest, Abdomen   Body parts bathed by helper: Right arm, Left arm, Front perineal area, Right lower leg, Left upper leg, Right upper leg, Buttocks, Left lower leg  Bathing assist Assist Level: Maximal Assistance - Patient 24 - 49%     Upper Body Dressing/Undressing Upper body dressing   What is the patient wearing?: Hospital gown only    Upper body assist Assist Level: Maximal Assistance - Patient 25 - 49%    Lower Body Dressing/Undressing Lower body dressing      What is the patient wearing?: Incontinence brief     Lower body assist Assist for lower body dressing: Total Assistance - Patient < 25%     Toileting Toileting    Toileting assist Assist for toileting: Maximal Assistance - Patient 25 - 49%     Transfers Chair/bed transfer  Transfers assist      Chair/bed transfer assist level: Minimal Assistance - Patient > 75%     Locomotion Ambulation   Ambulation assist      Assist level: Contact Guard/Touching assist Assistive device: Walker-rolling Max distance: 175'   Walk 10 feet activity   Assist     Assist level: Contact Guard/Touching assist Assistive device: Walker-rolling   Walk 50 feet activity   Assist Walk 50 feet with 2 turns activity did not occur: Safety/medical concerns  Assist level: Contact Guard/Touching assist Assistive device: Walker-rolling    Walk 150 feet activity   Assist Walk 150 feet activity did not occur: Safety/medical concerns  Assist level: Contact Guard/Touching assist Assistive device: Walker-rolling    Walk 10 feet on uneven surface  activity   Assist Walk 10 feet on uneven surfaces activity did not occur: Safety/medical concerns   Assist level: Minimal Assistance - Patient > 75% Assistive device: Biochemist, clinical     Assist Is the patient using a wheelchair?: Yes Type of Wheelchair: Manual    Wheelchair assist level: Dependent - Patient 0%      Wheelchair 50 feet with 2 turns activity    Assist        Assist Level: Dependent - Patient 0%   Wheelchair 150 feet activity     Assist      Assist Level: Dependent - Patient 0%  Plan: Blood pressure (!) 143/60, pulse 72, temperature 97.8 F (36.6 C), resp. rate 18, height 6' (1.829 m), weight 62.6 kg, SpO2 100%.  Medical Problem List and Plan: 1. 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 Per nursing no SLP needs.  - 1/21: Doing very well functionally. Progressing toward goals.  Moving DC date back to Saturday if can arrange Monday with OP dialysis.   The patient is anticipated to be medically ready for discharge 07/11/23 to home and will need follow-up with [facility]. In addition, they will need to follow up with their PCP, ID, Neurosurgery and Nephrology.   2.  Antithrombotics: -DVT/anticoagulation:  Pharmaceutical: Heparin 5000U q8h             -antiplatelet therapy: continue aspirin 325 mg daily   3. Pain Management:  -Tylenol as needed -Norco as needed 5-10 mg q4 hours- might need Oxy if not enough- was taking IV Dilaudid solely on Acute.              -Flexeril  10 mg TID as needed   -Pain well-controlled on current regimen, continue. -1-14: Minimal use of PRNs.  Change Tylenol to 1000 mg 3 times daily as needed, reduce Norco to 5 mg every 6 hours as needed only for moderate to severe pain. 1-16: Rare use of hydrocodone, reduced to 2.5 to 5 mg twice daily as needed and DC at discharge.  4. Mood/Behavior/Sleep: LCSW to evaluate and provide emotional support             -continue melatonin 5 mg q HS             -antipsychotic agents: n/a   - Sleeping well-- 1/19 poor sleep last night, monitor for now  1/21: improved/resovled   5. Neuropsych/cognition: This patient is capable of making decisions on his own behalf.   6. Skin/Wound Care: Routine skin care checks             -monitor surgical incisions -1-14: Has been 1 week since postop, may remove PCDF honeycomb dressing.  Due to drainage, will have nursing take photo of this with dressing change. -- moderate serosanguinous drainage  07-25-15: Stable appearance, no further drainage on gauze, healing well  1/21: Change gauze daily to reduce moisture trapping under PSLF site; no concerning features for infection--staying clean 1-22, ensure daily changes.  1/23; posterior gauze has remained dry, remove and leave open to air today.   7. Fluids/Electrolytes/Nutrition: Strict Is and Os and follow-up chemistries per  nephrology  - Admission labs with stable hyponatremia.  Follow q. weekly.--Stable, unchanged  8: Hypertension: monitor TID and prn -continue amlodipine 5 mg daily>> appears it was d/c'd on admission? Changed to carvedilol 3.125mg  BID             -continue lisinopril 20 mg BID  - 1-10: Mild blood pressure elevations today.  Monitor for trend, continue increase in amlodipine. -07/20/23 BPs ok, monitor for now, appears amlodipine was changed to carvedilol during admission-- no med adjustments today -07/21/23 BPs a little variable, monitor trend today with dialysis, may need adjustments tomorrow 1/14: add back norvasc 5 mg dail, add Prn hydralazine 25 mg for SBP >180, DBP >110 1/15-16: Improving with norvasc; continue 1/17: Diastolic a little low, systolic much improved since starting Norvasc.  Monitor and can consider reduction to 2.5 mg daily if symptomatic.  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 Marisue Humble ok with this, reduced to 500mg  TID   12: DM-2: A1c = 5.8%; at home on Tresiba Flextouch 12u at bedtime             -continue SSI             -  continue Semglee 2 units q HS    - 1/18-19/25 Well-controlled on current  regimen  -1-20: Hypoglycemic episode this a.m.; DC Semglee and change sliding scale to sensitive  1/21: BG improved; monitor - only getting 1-2 U SSI daily currently; can come off SSI at discharge Recent Labs    07/31/23 1147 07/31/23 1856 08/01/23 0616  GLUCAP 150* 122* 140*     13: Constipation: continue MiraLax daily, Senokot-S BID; prns ordered-resolved  - 1-10: Positive results with enema overnight.  Increase Miralax to 17 g twice daily and Senokot 2S to 2 tabs twice daily.  Add docusate suppository for moderate constipation and enema for severe constipation.  No signs concerning for obstruction. -07/20/23 only small BMs recently, wants to try another soap suds enema, will do that to see if we decrease stool burden.  -07/21/23 enema yesterday helped significantly, cont meds for now, if oversoftened then would back off stool regimen.  1-13: Start bowel program as below.  Stools remain liquid, so we will DC daily MiraLAX and have Senokot as 2 hours before bowel program at night. - see #17  14: Discitis/osteomyelitis with Staph epidermidis bacteremia:  -continue vancomycin 750 mg with HD through 08/26/2023- will likely change to Chronic Doxycycline after that, but need to reach out to ID when close to d/c to verify-- -1/14: Seems to have missed dosed with Monday's dialysis, got additional dose today per pharmacy. 1/23: Reached out to Dr. Daiva Eves regarding transition plan --has appointment scheduled 1-28, they will take care of transition to oral medication.  Continue vancomycin with dialysis for the Monday after discharge.   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 Dutch Quint 1-13, may remove for bathing and changing collar only.             -follow-up with Dr. Jordan Likes  -1-22: Patient asking about cervical collar clearance.  Advised this usually  takes at least 6 weeks, then outpatient follow-up.  Emphasized need for outpatient follow-up.   16: History of prior CVA: on aspirin and statin   17. Neurogenic bowel- resolved If doesn't work, will need soap suds enema, etc- might need KUB -See #13; had positive bowel movement with enema overnight, increasing bowel regimen and may need daily bowel program if no results in next 1 to 2 days - 1/13: 2x enema with BM; discussed with patient as above, will start nightly bowel program at 8 PM with Magic bullet suppository and digital stim with no results. 1-14.  Refused bowel program overnight.  Educated patient on importance of consistency with this, he is agreeable. 1/15: Patient now having daily, continent bowel movements. All bowel meds moved to PRN.  1-16.  Patient thinks he had incontinent bowel movement overnight, not recorded per nursing, will continue to monitor and may resume bowel program if further incontinent episodes.  Of note, patient states that he did not refuse suppository, claims he was never offered it.  1/15 LBM --increased sennakot S to 2 tabs BID -07/27/23 LBM 07/25/23, reduced fosrenol as above, monitor to see if this helps with his constipation -07/28/23 multiple BMs yesterday into today, feeling better, cont regimen for now, hopefully things start to regulate better LBM 1/19; now Continent bowel movements, monitor    LOS: 14 days A FACE TO FACE EVALUATION WAS PERFORMED  Angelina Sheriff  08/01/2023, 9:36 AM

## 2023-08-02 ENCOUNTER — Other Ambulatory Visit (HOSPITAL_COMMUNITY): Payer: Self-pay

## 2023-08-02 LAB — CBC
HCT: 29.7 % — ABNORMAL LOW (ref 39.0–52.0)
Hemoglobin: 9.3 g/dL — ABNORMAL LOW (ref 13.0–17.0)
MCH: 26.5 pg (ref 26.0–34.0)
MCHC: 31.3 g/dL (ref 30.0–36.0)
MCV: 84.6 fL (ref 80.0–100.0)
Platelets: 444 10*3/uL — ABNORMAL HIGH (ref 150–400)
RBC: 3.51 MIL/uL — ABNORMAL LOW (ref 4.22–5.81)
RDW: 17 % — ABNORMAL HIGH (ref 11.5–15.5)
WBC: 7.5 10*3/uL (ref 4.0–10.5)
nRBC: 0 % (ref 0.0–0.2)

## 2023-08-02 LAB — RENAL FUNCTION PANEL
Albumin: 3 g/dL — ABNORMAL LOW (ref 3.5–5.0)
Anion gap: 12 (ref 5–15)
BUN: 18 mg/dL (ref 8–23)
CO2: 26 mmol/L (ref 22–32)
Calcium: 9.1 mg/dL (ref 8.9–10.3)
Chloride: 92 mmol/L — ABNORMAL LOW (ref 98–111)
Creatinine, Ser: 5.88 mg/dL — ABNORMAL HIGH (ref 0.61–1.24)
GFR, Estimated: 10 mL/min — ABNORMAL LOW (ref >60)
Glucose, Bld: 144 mg/dL — ABNORMAL HIGH (ref 70–99)
Phosphorus: 3.8 mg/dL (ref 2.5–4.6)
Potassium: 4.4 mmol/L (ref 3.5–5.1)
Sodium: 130 mmol/L — ABNORMAL LOW (ref 135–145)

## 2023-08-02 LAB — GLUCOSE, CAPILLARY
Glucose-Capillary: 146 mg/dL — ABNORMAL HIGH (ref 70–99)
Glucose-Capillary: 130 mg/dL — ABNORMAL HIGH (ref 70–99)
Glucose-Capillary: 121 mg/dL — ABNORMAL HIGH (ref 70–99)
Glucose-Capillary: 153 mg/dL — ABNORMAL HIGH (ref 70–99)

## 2023-08-02 LAB — VANCOMYCIN, RANDOM: Vancomycin Rm: 28 ug/mL

## 2023-08-02 MED ORDER — PENTAFLUOROPROP-TETRAFLUOROETH EX AERO: 1.0000 | INHALATION_SPRAY | CUTANEOUS | Status: DC | PRN

## 2023-08-02 MED ORDER — POLYETHYLENE GLYCOL 3350 17 G PO PACK: 17.0000 g | PACK | Freq: Every day | ORAL | Status: AC | PRN

## 2023-08-02 MED ORDER — LIDOCAINE-PRILOCAINE 2.5-2.5 % EX CREA: 1.0000 | TOPICAL_CREAM | CUTANEOUS | Status: DC | PRN

## 2023-08-02 MED ORDER — ALTEPLASE 2 MG IJ SOLR
2.0000 mg | Freq: Once | INTRAMUSCULAR | Status: DC | PRN
Start: 2023-08-02 — End: 2023-08-02

## 2023-08-02 MED ORDER — HEPARIN SODIUM (PORCINE) 1000 UNIT/ML DIALYSIS: 1000.0000 [IU] | INTRAMUSCULAR | Status: DC | PRN

## 2023-08-02 MED ORDER — HEPARIN SODIUM (PORCINE) 1000 UNIT/ML DIALYSIS
5000.0000 [IU] | INTRAMUSCULAR | Status: DC | PRN
  Administered 2023-08-02: 5000 [IU] via INTRAVENOUS_CENTRAL
  Filled 2023-08-02: qty 5

## 2023-08-02 MED ORDER — SENNOSIDES-DOCUSATE SODIUM 8.6-50 MG PO TABS
1.0000 | ORAL_TABLET | Freq: Two times a day (BID) | ORAL | 0 refills | Status: DC
  Filled 2023-08-02: qty 60, 30d supply, fill #0

## 2023-08-02 MED ORDER — CARVEDILOL 3.125 MG PO TABS
3.1250 mg | ORAL_TABLET | Freq: Two times a day (BID) | ORAL | 0 refills | Status: AC
  Filled 2023-08-02: qty 60, 30d supply, fill #0

## 2023-08-02 MED ORDER — AMLODIPINE BESYLATE 5 MG PO TABS
5.0000 mg | ORAL_TABLET | Freq: Every day | ORAL | 0 refills | Status: AC
  Filled 2023-08-02: qty 30, 30d supply, fill #0

## 2023-08-02 MED ORDER — ANTICOAGULANT SODIUM CITRATE 4% (200MG/5ML) IV SOLN
5.0000 mL | Status: DC | PRN
  Filled 2023-08-02: qty 5

## 2023-08-02 MED ORDER — BISACODYL 10 MG RE SUPP
10.0000 mg | Freq: Every day | RECTAL | 1 refills | Status: AC | PRN
  Filled 2023-08-02: qty 4, 4d supply, fill #0

## 2023-08-02 MED ORDER — LIDOCAINE HCL (PF) 1 % IJ SOLN
5.0000 mL | INTRAMUSCULAR | Status: DC | PRN
Start: 2023-08-02 — End: 2023-08-02

## 2023-08-02 MED ORDER — LANTHANUM CARBONATE 1000 MG PO CHEW: 500.0000 mg | CHEWABLE_TABLET | Freq: Three times a day (TID) | ORAL | Status: AC

## 2023-08-02 MED ORDER — ACETAMINOPHEN 325 MG PO TABS: 325.0000 mg | ORAL_TABLET | Freq: Four times a day (QID) | ORAL | Status: AC | PRN

## 2023-08-02 MED ORDER — MELATONIN 5 MG PO TABS
5.0000 mg | ORAL_TABLET | Freq: Every day | ORAL | 0 refills | Status: AC
  Filled 2023-08-02: qty 30, 30d supply, fill #0

## 2023-08-02 NOTE — Progress Notes (Signed)
Occupational Therapy Session Note  Patient Details  Name: Shawn [name] MRN: [id number] Date of Birth: 1961/03/01  Today's Date: 08/02/2023 OT Individual Time: 0946-1100 OT Individual Time Calculation (min): 74 min    Short Term Goals: Week 2:  OT Short Term Goal 1 (Week 2): STG=LTG's based on LOS  Skilled Therapeutic Interventions/Progress Updates:   Pt open to all ADL's and testing for d/c with education and HEP teach back this session. Pt up in recliner eager for therapy. Pt amb level with rollator with S and cues for navigation due to blindness. Able to integrate all spinal precautions and hand coordination this session. Excellent progress with grip, 9hpt, and Bims. Left TIS level for session prior to HD. All needs and safety measures within reach.   Pain: no pain reported.   Therapy Documentation Precautions:  Precautions Precautions: Fall, Cervical Precaution Comments: pt blind; functional in own enviroment; cervical brace OOB to be aplied in sitting Required Braces or Orthoses: Cervical Brace Cervical Brace: Hard collar Restrictions Weight Bearing Restrictions Per Provider Order: No   Therapy/Group: Individual Therapy  [name] 08/02/2023, 7:11 AM

## 2023-08-02 NOTE — Progress Notes (Signed)
Pharmacy Antibiotic Note  Shawn [name] is a 63 y.o. male admitted on 07/18/2023 with  MRSE discitis .  Pharmacy has been consulted for Vancomycin dosing.  ESRD, on HD MWF. Random Vancomycin level today is 28 mcg/ml. Last 2 levels have been 25 mcg/ml, the usual upper limit of target pre-HD levels.  Estimate level will drop to ~19 mcg/ml, okay for re-dosing.    Plan: Continue Vancomycin 750 mg IV after each HD MWF. To continue thru 08/26/23 per ID, then anticipate oral doxycycline, or per ID. Consider rechecking pre-HD level in 1 week (1/31). If pre-HD level trends up, could consider decreasing Vancomycin dose to 500 mg. Target pre-HD levels 15-25 mcg/ml.  Height: 6' (182.9 cm) Weight: 62.6 kg (138 lb 1.6 oz) IBW/kg (Calculated) : 77.6  Temp (24hrs), Avg:98.1 F (36.7 C), Min:98 F (36.7 C), Max:98.1 F (36.7 C)  Recent Labs  Lab 07/29/23 1028 08/02/23 0913  WBC 7.4 7.5  CREATININE 7.66* 5.88*  VANCORANDOM  --  28   ESRD  No Known Allergies  Antimicrobials this admission: Vancomycin 12/31>> (2/17) Cefepime 12/31>>1/1 Cefazolin x 1 on 1/3 and 1/6 (in IR) surgical prophylaxis  Dose adjustments this admission: 1/8 &1/15: VR 25 mcg/ml pre-HD - continue Vanc 750 mg MWF after HD 1/24: VR 28 mcg/ml pre-HD - continue Vanc 750 mg MWF after HD  Microbiology results: 12/31 MRSA PCR: negative 12/31 blood: GPC in clusters>>3/4 MRSE 1/2 blood: negative 1/3 MRSA PCR: negative  Thank you for allowing pharmacy to be a part of this patient's care.  Dennie Fetters, [place] 08/02/2023 12:25 PM

## 2023-08-02 NOTE — Progress Notes (Signed)
Occupational Therapy Discharge Summary  Patient Details  Name: Shawn [name] MRN: [id number] Date of Birth: April 03, 1961  Date of Discharge from OT service:{Time; dates multiple:304500300}  {CHL IP REHAB OT TIME CALCULATIONS:304400400}   Patient has met 12 of 12 long term goals due to improved activity tolerance, improved balance, postural control, ability to compensate for deficits, functional use of  RIGHT upper, RIGHT lower, LEFT upper, and LEFT lower extremity, and improved coordination.  Patient to discharge at overall Supervision level.  Patient's care partner is independent to provide the necessary physical assistance at discharge.    Reasons goals not met: n/a    Recommendation:  Patient will benefit from ongoing skilled OT services in home health setting to continue to advance functional skills in the area of BADL and Reduce care partner burden.  Equipment: No equipment providedexcept LH sponge as pt has all needed DME   Reasons for discharge: treatment goals met  Patient/family agrees with progress made and goals achieved: Yes  OT Discharge Precautions/Restrictions  Precautions Precautions: Fall;Cervical Precaution Comments: pt blind; functional in own enviroment; cervical brace OOB to be aplied in sitting Required Braces or Orthoses: Cervical Brace Cervical Brace: Hard collar Restrictions Weight Bearing Restrictions Per Provider Order: No General   Vital Signs Therapy Vitals Temp: 97.9 F (36.6 C) Pulse Rate: 81 Resp: 13 BP: (!) 144/67 Patient Position (if appropriate): Lying Oxygen Therapy SpO2: 100 % O2 Device: Room Air Patient Activity (if Appropriate): In bed Pulse Oximetry Type: Continuous Oximetry Probe Site Changed: No Pain Pain Assessment Pain Scale: 0-10 Pain Score: 0-No pain ADL ADL Equipment Provided: Long-handled sponge Eating: Modified independent Where Assessed-Eating: Wheelchair Grooming: Supervision/safety Where  Assessed-Grooming: Wheelchair Upper Body Bathing: Supervision/safety Where Assessed-Upper Body Bathing: Sitting at sink Lower Body Bathing: Supervision/safety Where Assessed-Lower Body Bathing: Standing at sink, Sitting at sink Upper Body Dressing: Supervision/safety Where Assessed-Upper Body Dressing: Sitting at sink Lower Body Dressing: Supervision/safety Where Assessed-Lower Body Dressing: Standing at sink, Sitting at sink, Wheelchair Toileting: Supervision/safety Where Assessed-Toileting: Bedside Commode, Actuary Transfer: Close supervision Toilet Transfer Method: Stand pivot, Proofreader: Bedside commode, Grab bars Tub/Shower Transfer: Not assessed Film/video editor: Not assessed ADL Comments: Supervision due to blindness with baseline sponge bathing only. OT training with wife to change and wash out Aspen collar pads Vision Baseline Vision/History: 2 Legally blind Patient Visual Report: No change from baseline Perception  Perception: Impaired Perception-Other Comments: blindness Praxis Praxis: WFL Cognition Cognition Overall Cognitive Status: Within Functional Limits for tasks assessed Arousal/Alertness: Awake/alert Memory: Appears intact Problem Solving: Appears intact Safety/Judgment: Appears intact Brief Interview for Mental Status (BIMS) Repetition of Three Words (First Attempt): 3 Temporal Orientation: Year: Correct Temporal Orientation: Month: Accurate within 5 days Temporal Orientation: Day: Correct Recall: "Sock": Yes, no cue required Recall: "Blue": Yes, no cue required Recall: "Bed": Yes, no cue required BIMS Summary Score: 15 Sensation Sensation Light Touch: Appears Intact Hot/Cold: Appears Intact Proprioception: Appears Intact Stereognosis: Appears Intact Coordination Gross Motor Movements are Fluid and Coordinated: No Fine Motor Movements are Fluid and Coordinated: No Coordination and Movement Description:  decreased smoothness and accuracy secondary to visual impairment, improved from eval Motor  Motor Motor: Other (comment) Motor - Discharge Observations: generalized weakness Mobility  Bed Mobility Bed Mobility: Supine to Sit;Sit to Supine Supine to Sit: Independent with assistive device Sit to Supine: Independent with assistive device Transfers Sit to Stand: Supervision/Verbal cueing Stand to Sit: Supervision/Verbal cueing  Trunk/Postural Assessment  Cervical Assessment Cervical Assessment: Exceptions to [facility] (cervical precautions,  c-collar) Thoracic Assessment Thoracic Assessment: Exceptions to [facility] (rounded shoulders,) Lumbar Assessment Lumbar Assessment: Exceptions to [facility] (posterior pelvic tilt) Postural Control Postural Control: Deficits on evaluation Righting Reactions: delayed and inadequate wtihout UE support Protective Responses: decreased Postural Limitations: decreased  Balance Balance Balance Assessed: Yes Static Sitting Balance Static Sitting - Balance Support: Bilateral upper extremity supported;Feet supported Static Sitting - Level of Assistance: 6: Modified independent (Device/Increase time) Dynamic Sitting Balance Dynamic Sitting - Balance Support: Feet unsupported;No upper extremity supported;During functional activity Dynamic Sitting - Level of Assistance: 6: Modified independent (Device/Increase time) Static Standing Balance Static Standing - Balance Support: Bilateral upper extremity supported;During functional activity Static Standing - Level of Assistance: 5: Stand by assistance Dynamic Standing Balance Dynamic Standing - Balance Support: Bilateral upper extremity supported;During functional activity Dynamic Standing - Level of Assistance: 5: Stand by assistance Extremity/Trunk Assessment RUE Assessment Active Range of Motion (AROM) Comments: 0-110 General Strength Comments: 4-/5 45 lb grip LUE Assessment LUE Assessment: Within Functional  Limits Passive Range of Motion (PROM) Comments: full Active Range of Motion (AROM) Comments: 0-130 General Strength Comments: 4-/5 55 lbs grip   Shawn [name] 08/02/2023, 2:39 PM

## 2023-08-02 NOTE — Progress Notes (Signed)
Physical Therapy Session Note  Patient Details  Name: Shawn [name] MRN: [id number] Date of Birth: [birth year]  Today's Date: 08/02/2023 PT Individual Time: 1027-2536 + 1120-1205 PT Individual Time Calculation (min): 70 min + 45 min  Short Term Goals: Week 2:  PT Short Term Goal 1 (Week 2): Pt will ambulate 150' with LRAD CGA PT Short Term Goal 2 (Week 2): Pt willl complete transfer with RW CGA PT Short Term Goal 3 (Week 2): Pt will maintain dynamic standing balance during functional task with supervision  Skilled Therapeutic Interventions/Progress Updates:    SESSION 1: Pt presents in room in bed, agreeable to PT. Pt reporting abdominal cramping states from taking stool softener. Session focused on gait training for tolerance to upright and dynamic standing balance as well as therapeutic exercise for home exercise program, therapeutic activities for transfer training.  Pt completes bed mobility with increased time with supervision to sit EOB, set up assist for donning shoes. Pt remains seated EOB for RN to administer medications, pt then stands to rollator with supervision. Pt ambulates with rollator with supervision for postural stability, pt does require assist for steering rollator for navigating room and hallway due to visual impairments. Pt ambulates to day room and comes to sitting in TIS WC for rest break. Pt then completes therapeutic exercise to complete as HEP with handout provided, pt completes one set of the following exercises with supervision and cues for technique as well as safety with tasks:  Access Code: [email or web address] URL: [email or web address] Date: 08/02/2023 Prepared by: Edwin Cap  Exercises - Standing March with Counter Support  - 2 x daily - 7 x weekly - 1 sets - 20 reps - Heel Raises with Counter Support  - 2 x daily - 7 x weekly - 1 sets - 20 reps - Mini Squat with Counter Support  - 2 x daily - 7 x weekly - 1 sets - 10 reps - Standing Hip  Abduction with Walker for Support  - 2 x daily - 7 x weekly - 1 sets - 10 reps - Side Stepping with Counter Support  - 2 x daily - 7 x weekly - 3 sets - 10 reps  Pt then ambulates with rollator to ortho gym ~250' with supervision for postural stability, assist for steering rollator due to visual impairments. Pt takes seated rest break in arm chair due to fatigue then completes ambulatory transfer to car with supervision, completes transfer with supervision and verbal cues. Pt then ambulates up/down ramp with rollator with CGA for postural stability and cues for direction.   Pt ambulates back to day room 350' with rollator supervision with assist for steering rollator to navigate obstacles and busy hallway. Pt returns to sitting on recliner and remains with all needs within reach, call light in place at end of session.   SESSION 2: Pt presents in room in TIS WC, agreeable to PT. Pt denies pain at this time and reports having a continent bowel movement. Session focused on gait training and NMR for dynamic standing balance and single limb stability.  Pt transported to main gym dependently for time management. Pt ambulates 3x80' with SPC and L HHA, CGA for task no LOB. Completed to promote stability with gait with decreasing UE support.  Pt completes NMR for dynamic standing balance, single limb stability, and multidirectional stepping stability including: - forward step x10 BLE (one significant LOB when stepping with LLE requiring mod assist) - lateral step x10 BLE - backward step x10 BLE  Pt transported back to room and completes ambulatory transfer with SPC back to bed. Pt completes sit to supine modI and remains supine, set up for lunch with breakfast tray placed in front of pt, all needs within reach, call light in place at end of session.  Therapy Documentation Precautions:  Precautions Precautions: Fall, Cervical Precaution Comments: pt blind; functional in own enviroment; cervical brace  OOB to be aplied in sitting Required Braces or Orthoses: Cervical Brace Cervical Brace: Hard collar Restrictions Weight Bearing Restrictions Per Provider Order: No  Therapy/Group: Individual Therapy  Edwin Cap PT, DPT 08/02/2023, 9:18 AM

## 2023-08-02 NOTE — Progress Notes (Signed)
Received patient in bed to unit.  Alert and oriented.  Informed consent signed and in chart.   TX duration:3 hours  Patient tolerated well.  Transported back to the room  Alert, without acute distress.  Hand-off given to patient's nurse.   Access used: R internal jugular HD Cath Access issues: none  Total UF removed: 2L Medication(s) given: Vancomycin   08/02/23 1658  Vitals  Temp 98.5 F (36.9 C)  Temp Source Oral  BP (!) 146/73  MAP (mmHg) 93  Pulse Rate 90  ECG Heart Rate 92  Resp 14  Oxygen Therapy  SpO2 98 %  O2 Device Room Air  Patient Activity (if Appropriate) In bed  Pulse Oximetry Type Continuous  During Treatment Monitoring  Duration of HD Treatment -hour(s) 3 hour(s)  HD Safety Checks Performed Yes  Intra-Hemodialysis Comments Tx completed;Tolerated well  Dialysis Fluid Bolus Normal Saline  Bolus Amount (mL) 300 mL  Post Treatment  Dialyzer Clearance Clear  Liters Processed 72  Fluid Removed (mL) 2000 mL  Tolerated HD Treatment Yes  Hemodialysis Catheter Right Internal jugular Double lumen Permanent (Tunneled)  Placement Date/Time: 07/12/23 1816   Placed prior to admission: No  Serial / Lot #: [id number]  Expiration Date: 12/07/27  Time Out: Correct patient;Correct procedure;Correct site  Maximum sterile barrier precautions: Hand hygiene;Cap;Mask;Sterile gown...  Site Condition No complications  Blue Lumen Status Flushed;Heparin locked;Dead end cap in place  Red Lumen Status Flushed;Heparin locked;Dead end cap in place  Purple Lumen Status N/A  Catheter fill solution Heparin 1000 units/ml  Catheter fill volume (Arterial) 1.9 cc  Catheter fill volume (Venous) 1.9  Dressing Type Transparent  Dressing Status Clean, Dry, Intact  Interventions Other (Comment) (deaccessed)  Drainage Description None  Dressing Change Due 08/09/23  Post treatment catheter status Capped and Clamped     Stacie Glaze LPN [facility]

## 2023-08-02 NOTE — Progress Notes (Signed)
Physical Therapy Discharge Summary  Patient Details  Name: Shawn [name] MRN: [id number] Date of Birth: 07/30/1960  Date of Discharge from PT service:August 03, 2023  {CHL IP REHAB PT TIME CALCULATION:304800500}   Patient has met 7 of 7 long term goals due to improved activity tolerance, improved balance, improved postural control, increased strength, and ability to compensate for deficits. In hospital setting pt does require assist for steering rollator for navigating obstacles but requires supervision only for postural stability for ambulation and transfers. Patient to discharge at an ambulatory level Supervision.   Patient's care partner is independent to provide the necessary physical assistance at discharge.  Reasons goals not met: N/A  Recommendation:  Patient will benefit from ongoing skilled PT services in home health setting to continue to advance safe functional mobility, address ongoing impairments in gait mechanics, dynamic postural stability, and minimize fall risk.  Equipment: No equipment provided  Reasons for discharge: treatment goals met and discharge from hospital  Patient/family agrees with progress made and goals achieved: Yes  PT Discharge Precautions/Restrictions Precautions Precautions: Fall;Cervical Precaution Comments: pt blind; functional in own enviroment; cervical brace OOB to be aplied in sitting Required Braces or Orthoses: Cervical Brace Cervical Brace: Hard collar Restrictions Weight Bearing Restrictions Per Provider Order: No Pain Interference Pain Interference Pain Effect on Sleep: 1. Rarely or not at all Pain Interference with Therapy Activities: 1. Rarely or not at all Pain Interference with Day-to-Day Activities: 1. Rarely or not at all Cognition Overall Cognitive Status: Within Functional Limits for tasks assessed Arousal/Alertness: Awake/alert Orientation Level: Oriented X4 Sensation Sensation Light Touch: Appears  Intact Hot/Cold: Appears Intact Proprioception: Appears Intact Stereognosis: Appears Intact Coordination Gross Motor Movements are Fluid and Coordinated: No Fine Motor Movements are Fluid and Coordinated: No Coordination and Movement Description: decreased smoothness and accuracy secondary to visual impairment, improved from eval Motor  Motor Motor: Other (comment) Motor - Discharge Observations: generalized weakness  Mobility Bed Mobility Bed Mobility: Supine to Sit;Sit to Supine Supine to Sit: Independent with assistive device Sit to Supine: Independent with assistive device Transfers Transfers: Stand to Sit;Sit to Stand;Stand Pivot Transfers Sit to Stand: Supervision/Verbal cueing Stand to Sit: Supervision/Verbal cueing Stand Pivot Transfers: Supervision/Verbal cueing Stand Pivot Transfer Details: Verbal cues for precautions/safety Stand Pivot Transfer Details (indicate cue type and reason): verbal cues secondary to visual impairements Transfer (Assistive device): Rollator Locomotion  Gait Gait Assistance: Supervision/Verbal cueing Gait Distance (Feet): 350 Feet Assistive device: Rollator Gait Assistance Details: Verbal cues for safe use of DME/AE Gait Assistance Details: verbal cues and assist for steering due to visual impairment Gait Gait: Yes Gait Pattern: Impaired Gait Pattern: Step-through pattern Gait velocity: decreased Stairs / Additional Locomotion Stairs: No Ramp: Contact Guard/touching assist Wheelchair Mobility Wheelchair Mobility: No  Trunk/Postural Assessment  Cervical Assessment Cervical Assessment: Exceptions to [facility] (cervical precautions, c-collar) Thoracic Assessment Thoracic Assessment: Exceptions to [facility] (rounded shoulders,) Lumbar Assessment Lumbar Assessment: Exceptions to [facility] (posterior pelvic tilt) Postural Control Postural Control: Deficits on evaluation Righting Reactions: delayed and inadequate wtihout UE support Protective Responses:  decreased Postural Limitations: decreased  Balance Balance Balance Assessed: Yes Static Sitting Balance Static Sitting - Balance Support: Bilateral upper extremity supported;Feet supported Static Sitting - Level of Assistance: 6: Modified independent (Device/Increase time) Dynamic Sitting Balance Dynamic Sitting - Balance Support: Feet unsupported;No upper extremity supported;During functional activity Dynamic Sitting - Level of Assistance: 6: Modified independent (Device/Increase time) Static Standing Balance Static Standing - Balance Support: Bilateral upper extremity supported;During functional activity Static Standing - Level  of Assistance: 5: Stand by assistance Dynamic Standing Balance Dynamic Standing - Balance Support: Bilateral upper extremity supported;During functional activity Dynamic Standing - Level of Assistance: 5: Stand by assistance Extremity Assessment  RUE Assessment Active Range of Motion (AROM) Comments: 0-110 General Strength Comments: 4-/5 45 lb grip LUE Assessment LUE Assessment: Within Functional Limits Passive Range of Motion (PROM) Comments: full Active Range of Motion (AROM) Comments: 0-130 General Strength Comments: 4-/5 55 lbs grip RLE Assessment RLE Assessment: Within Functional Limits General Strength Comments: tested in sitting RLE Strength Right Hip Flexion: 4+/5 Right Knee Flexion: 5/5 Right Knee Extension: 5/5 Right Ankle Dorsiflexion: 4/5 Right Ankle Plantar Flexion: 4/5 LLE Assessment LLE Assessment: Within Functional Limits General Strength Comments: grossly 5/5 tested in sitting   Edwin Cap PT, DPT 08/02/2023, 11:30 AM

## 2023-08-02 NOTE — Progress Notes (Signed)
PROGRESS NOTE   Subjective/Complaints: No acute complaints. No events overnight.  Patient seen briefly in therapy gym, without any changes today. Some mild hypertension today, but is his dialysis today.  ROS:  Denies fevers, chills, N/V, abdominal pain, diarrhea, SOB, cough, chest pain, new weakness or paraesthesias. _ neck pain - well controlled    Objective:   No results found. Recent Labs    08/02/23 0913  WBC 7.5  HGB 9.3*  HCT 29.7*  PLT 444*    Recent Labs    08/02/23 0913  NA 130*  K 4.4  CL 92*  CO2 26  GLUCOSE 144*  BUN 18  CREATININE 5.88*  CALCIUM 9.1     Intake/Output Summary (Last 24 hours) at 08/02/2023 1710 Last data filed at 08/02/2023 1658 Gross per 24 hour  Intake 356 ml  Output 2000 ml  Net -1644 ml     Pressure Injury 07/18/23 Sacrum Mid Stage 1 -  Intact skin with non-blanchable redness of a localized area usually over a bony prominence. (Active)  07/18/23 1630 (Assessed by Ivor Messier, LPN and Becky, RN)  Location: Sacrum  Location Orientation: Mid  Staging: Stage 1 -  Intact skin with non-blanchable redness of a localized area usually over a bony prominence.  Wound Description (Comments):   Present on Admission: Yes    Physical Exam: Vital Signs Blood pressure (!) 148/71, pulse 90, temperature 98.5 F (36.9 C), temperature source Oral, resp. rate 16, height 6' (1.829 m), weight 61.5 kg, SpO2 100%.  Constitutional: No apparent distress. Appropriate appearance for age.  In wheelchair with therapies. HENT: Atraumatic, normocephalic. + Cervical collar--well-fitting. Eyes: Bilateral blindness-unchanged Cardiovascular: RRR, +murmur, no rub/gallops. No Edema. Peripheral pulses 2+  Respiratory: CTAB , No rales, rhonchi, or wheezing. On RA.  Abdomen: + bowel sounds more normoactive.  Soft, nontender.  No distention.  Skin:  + PCDF site -no drainage on overlying gauze, removed, dry  serosanguineous fluid with intact Steri-Strips.  Well-approximated.   + Anterior surgical site with Steri-Strips intact, no active drainage, clean and dry  MSK:      No apparent deformity.  Difficulty with bilateral shoulder abduction >110 degrees -improving tolerance although range of motion remains reduced    Neurologic exam:  AAOx3.  Follows all simple commands.  No apparent cognitive deficits.  Cranial nerves intact.  Bilateral stocking glove pattern sensory neuropathy. Strength 5- out of 5 throughout.   No tone, spasticity, or ataxia.   Good insight into current deficits  -Unchanged 1-24       Assessment/Plan: 1. Functional deficits which require 3+ hours per day of interdisciplinary therapy in a comprehensive inpatient rehab setting. Physiatrist is providing close team supervision and 24 hour management of active medical problems listed below. Physiatrist and rehab team continue to assess barriers to discharge/monitor patient progress toward functional and medical goals  Care Tool:  Bathing    Body parts bathed by patient: Face, Chest, Abdomen   Body parts bathed by helper: Right arm, Left arm, Front perineal area, Right lower leg, Left upper leg, Right upper leg, Buttocks, Left lower leg     Bathing assist Assist Level: Maximal Assistance - Patient 24 - 49%  Upper Body Dressing/Undressing Upper body dressing   What is the patient wearing?: Hospital gown only    Upper body assist Assist Level: Maximal Assistance - Patient 25 - 49%    Lower Body Dressing/Undressing Lower body dressing      What is the patient wearing?: Incontinence brief     Lower body assist Assist for lower body dressing: Total Assistance - Patient < 25%     Toileting Toileting    Toileting assist Assist for toileting: Maximal Assistance - Patient 25 - 49%     Transfers Chair/bed transfer  Transfers assist     Chair/bed transfer assist level: Supervision/Verbal cueing      Locomotion Ambulation   Ambulation assist      Assist level: Supervision/Verbal cueing Assistive device: Rollator Max distance: 350'   Walk 10 feet activity   Assist     Assist level: Supervision/Verbal cueing Assistive device: Rollator   Walk 50 feet activity   Assist Walk 50 feet with 2 turns activity did not occur: Safety/medical concerns  Assist level: Supervision/Verbal cueing Assistive device: Rollator    Walk 150 feet activity   Assist Walk 150 feet activity did not occur: Safety/medical concerns  Assist level: Supervision/Verbal cueing Assistive device: Rollator    Walk 10 feet on uneven surface  activity   Assist Walk 10 feet on uneven surfaces activity did not occur: Safety/medical concerns   Assist level: Contact Guard/Touching assist Assistive device: Rollator   Wheelchair     Assist Is the patient using a wheelchair?: No (ambulating to/from therapy gym) Type of Wheelchair: Manual    Wheelchair assist level: Dependent - Patient 0%      Wheelchair 50 feet with 2 turns activity    Assist        Assist Level: Dependent - Patient 0%   Wheelchair 150 feet activity     Assist      Assist Level: Dependent - Patient 0%  Plan: Blood pressure (!) 148/71, pulse 90, temperature 98.5 F (36.9 C), temperature source Oral, resp. rate 16, height 6' (1.829 m), weight 61.5 kg, SpO2 100%.  Medical Problem List and Plan: 1. 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 Per nursing no SLP needs.  - 1/21: Doing very well functionally. Progressing toward goals. Moving DC date back to Saturday if can arrange Monday with OP dialysis -   now sunday   The patient is anticipated to be medically ready for discharge 08/04/23 to home and will need follow-up with [facility]. In addition, they will need to follow up with their PCP, ID, Neurosurgery and Nephrology.   2.  Antithrombotics: -DVT/anticoagulation:  Pharmaceutical: Heparin 5000U q8h             -antiplatelet therapy: continue aspirin 325 mg daily   3. Pain Management:  -Tylenol as needed -Norco as needed 5-10 mg q4 hours- might need Oxy if not enough- was taking IV Dilaudid solely on Acute.              -Flexeril 10 mg TID as needed   -Pain well-controlled on current regimen, continue. -1-14: Minimal  use of PRNs.  Change Tylenol to 1000 mg 3 times daily as needed, reduce Norco to 5 mg every 6 hours as needed only for moderate to severe pain. 1-16: Rare use of hydrocodone, reduced to 2.5 to 5 mg twice daily as needed and DC at discharge.  4. Mood/Behavior/Sleep: LCSW to evaluate and provide emotional support             -continue melatonin 5 mg q HS             -antipsychotic agents: n/a   - Sleeping well-- 1/19 poor sleep last night, monitor for now  1/21: improved/resovled   5. Neuropsych/cognition: This patient is capable of making decisions on his own behalf.   6. Skin/Wound Care: Routine skin care checks             -monitor surgical incisions -1-14: Has been 1 week since postop, may remove PCDF honeycomb dressing.  Due to drainage, will have nursing take photo of this with dressing change. -- moderate serosanguinous drainage  07-25-15: Stable appearance, no further drainage on gauze, healing well  1/21: Change gauze daily to reduce moisture trapping under PSLF site; no concerning features for infection--staying clean 1-22, ensure daily changes.  1/23; posterior gauze has remained dry, remove and leave open to air today.--stable appearance   7. Fluids/Electrolytes/Nutrition: Strict Is and Os and follow-up chemistries per nephrology  - Admission labs with stable  hyponatremia.  Follow q. weekly.--Stable, unchanged  8: Hypertension: monitor TID and prn -continue amlodipine 5 mg daily>> appears it was d/c'd on admission? Changed to carvedilol 3.125mg  BID             -continue lisinopril 20 mg BID  - 1-10: Mild blood pressure elevations today.  Monitor for trend, continue increase in amlodipine. -07/20/23 BPs ok, monitor for now, appears amlodipine was changed to carvedilol during admission-- no med adjustments today -07/21/23 BPs a little variable, monitor trend today with dialysis, may need adjustments tomorrow 1/14: add back norvasc 5 mg dail, add Prn hydralazine 25 mg for SBP >180, DBP >110 1/15-16: Improving with norvasc; continue 1/17: Diastolic a little low, systolic much improved since starting Norvasc.  Monitor and can consider reduction to 2.5 mg daily if symptomatic.  -Blood pressures are somewhat variable with elevations into the low 160s, lows into the 120s to 130s, generally fluctuates with dialysis.  Continue current regimen. 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 Marisue Humble ok with this, reduced to 500mg  TID   12: DM-2: A1c = 5.8%; at home on Tresiba Flextouch 12u at bedtime             -continue SSI             -  continue Semglee 2 units q HS    - 1/18-19/25  Well-controlled on current regimen  -1-20: Hypoglycemic episode this a.m.; DC Semglee and change sliding scale to sensitive  1/21: BG improved; monitor - only getting 1-2 U SSI daily currently; can come off SSI at discharge  1-23: Single elevated blood glucose reading overnight, monitor Recent Labs    08/01/23 2147 08/02/23 0705 08/02/23 1121  GLUCAP 205* 146* 130*     13: Constipation: continue MiraLax daily, Senokot-S BID; prns ordered-resolved  - 1-10: Positive results with enema overnight.  Increase Miralax to 17 g twice daily and Senokot 2S to 2 tabs twice daily.  Add docusate suppository for moderate constipation and enema for severe constipation.  No signs concerning for obstruction. -07/20/23 only small BMs recently, wants to try another soap suds enema, will do that to see if we decrease stool burden.  -07/21/23 enema yesterday helped significantly, cont meds for now, if oversoftened then would back off stool regimen.  1-13: Start bowel program as below.  Stools remain liquid, so we will DC daily MiraLAX and have Senokot as 2 hours before bowel program at night. - see #17  14: Discitis/osteomyelitis with Staph epidermidis bacteremia:  -continue vancomycin 750 mg with HD through 08/26/2023- will likely change to Chronic Doxycycline after that, but need to reach out to ID when close to d/c to verify-- -1/14: Seems to have missed dosed with Monday's dialysis, got additional dose today per pharmacy. 1/23: Reached out to Dr. Daiva Eves regarding transition plan --has appointment scheduled 1-28, they will take care of transition to oral medication.  Continue vancomycin with dialysis for the Monday after discharge.   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 Dutch Quint 1-13, may remove for bathing and changing collar only.             -follow-up with  Dr. Jordan Likes  -1-22: Patient asking about cervical collar clearance.  Advised this usually takes at least 6 weeks, then outpatient follow-up.  Emphasized need for outpatient follow-up.   16: History of prior CVA: on aspirin and statin   17. Neurogenic bowel- resolved If doesn't work, will need soap suds enema, etc- might need KUB -See #13; had positive bowel movement with enema overnight, increasing bowel regimen and may need daily bowel program if no results in next 1 to 2 days - 1/13: 2x enema with BM; discussed with patient as above, will start nightly bowel program at 8 PM with Magic bullet suppository and digital stim with no results. 1-14.  Refused bowel program overnight.  Educated patient on importance of consistency with this, he is agreeable. 1/15: Patient now having daily, continent bowel movements. All bowel meds moved to PRN.  1-16.  Patient thinks he had incontinent bowel movement overnight, not recorded per nursing, will continue to monitor and may resume bowel program if further incontinent episodes.  Of note, patient states that he did not refuse suppository, claims he was never offered it.  1/15 LBM --increased sennakot S to 2 tabs BID -07/27/23 LBM 07/25/23, reduced fosrenol as above, monitor to see if this helps with his constipation -07/28/23 multiple BMs yesterday into today, feeling better, cont regimen for now, hopefully things start to regulate better LBM 1/24; now Continent bowel movements    LOS: 15 days A FACE TO  FACE EVALUATION WAS PERFORMED  Angelina Sheriff 08/02/2023, 5:10 PM

## 2023-08-02 NOTE — Progress Notes (Signed)
Contacted [facility] to confirm plan for pt  to be d/c on Sunday and that pt should resume care on Monday.   Olivia Canter Renal Navigator [phone]

## 2023-08-02 NOTE — Progress Notes (Signed)
[facility] Progress Note   Subjective:   Seen in room, in good spirits, reports he is going home this weekend. Denies SOB, CP, dizziness, nausea.   Objective Vitals:   08/01/23 1432 08/01/23 2057 08/01/23 2059 08/02/23 0508  BP: 136/61 137/65 137/65 (!) 148/65  Pulse: 76 79  73  Resp: 18 17  16   Temp: 98.1 F (36.7 C) 98 F (36.7 C)  98.1 F (36.7 C)  TempSrc:    Oral  SpO2: 100% 99%  100%  Weight:      Height:       Physical Exam General: Alert male in NAD Heart: RRR, no murmurs, rubs or gallops Lungs: CTA bilaterally, respirations unlabored Abdomen: Non-distended, +BS Extremities:no edema b/l lower extremities Dialysis Access:  [facility]  Additional Objective Labs: Basic Metabolic Panel: Recent Labs  Lab 07/26/23 0920 07/29/23 1028  NA 128* 128*  K 4.2 4.9  CL 92* 90*  CO2 26 26  GLUCOSE 142* 57*  BUN 18 26*  CREATININE 6.31* 7.66*  CALCIUM 8.6* 8.9  PHOS 3.6 3.4   Liver Function Tests: Recent Labs  Lab 07/26/23 0920 07/29/23 1028  ALBUMIN 2.7* 2.8*   No results for input(s): "LIPASE", "AMYLASE" in the last 168 hours. CBC: Recent Labs  Lab 07/26/23 0920 07/29/23 1028  WBC 5.7 7.4  HGB 7.9* 9.2*  HCT 25.5* 28.8*  MCV 86.1 83.5  PLT 357 432*   Blood Culture    Component Value Date/Time   SDES BLOOD RIGHT ARM 07/11/2023 1549   SPECREQUEST  07/11/2023 1549    BOTTLES DRAWN AEROBIC ONLY Blood Culture results may not be optimal due to an inadequate volume of blood received in culture bottles   CULT  07/11/2023 1549    NO GROWTH 5 DAYS Performed at [facility], [address], [place], [place] [address]    REPTSTATUS 07/16/2023 FINAL 07/11/2023 1549    Cardiac Enzymes: No results for input(s): "CKTOTAL", "CKMB", "CKMBINDEX", "TROPONINI" in the last 168 hours. CBG: Recent Labs  Lab 08/01/23 0616 08/01/23 1138 08/01/23 1624 08/01/23 2147 08/02/23 0705  GLUCAP 140* 140* 174* 205* 146*   Iron Studies: No results for  input(s): "IRON", "TIBC", "TRANSFERRIN", "FERRITIN" in the last 72 hours. @lablastinr3 @ Studies/Results: No results found. Medications:  heparin sodium (porcine)     vancomycin (VANCOCIN) 750 mg in sodium chloride 0.9 % 250 mL IVPB 750 mg (07/31/23 1904)    (feeding supplement) PROSource Plus  30 mL Oral BID BM   amLODipine  5 mg Oral Daily   aspirin EC  325 mg Oral QHS   atorvastatin  80 mg Oral Daily   brimonidine  1 drop Both Eyes BID   calcitRIOL  0.5 mcg Oral Q M,W,F   carvedilol  3.125 mg Oral BID WC   Chlorhexidine Gluconate Cloth  6 each Topical Q12H   darbepoetin (ARANESP) injection - DIALYSIS  100 mcg Subcutaneous Q Wed-1800   dorzolamide-timolol  1 drop Both Eyes BID   heparin  5,000 Units Subcutaneous Q8H   insulin aspart  0-6 Units Subcutaneous TID WC   lanthanum  500 mg Oral TID WC   latanoprost  1 drop Both Eyes QHS   lisinopril  20 mg Oral BID   melatonin  5 mg Oral QHS   senna-docusate  2 tablet Oral BID    Dialysis Orders: MWF - [facility] 3:30hr, 400/A1.5, EDW 69.9kg, 2K/2.5Ca, TDC, heparin 6500 unit bolus + 2900 unit mid-run bolus - Mircera IV q 2  weeks - last 12/22 - Calcitriol 0.66mcg PO q HD  Assessment/Plan: Cervical discitis/osteomyelitis: Blood Cx + MRSE. 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 ESRD:  Continue MWF HD schedule, next HD 1/24. HTN/volume: BP controlled. Continue current regimen. Well below prior EDW, lower on d/c. 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 Lanthanum dose reduced  Nutrition: Albumin low, continue supplements and ordered diet T2DM with retinopathy/blindness Dispo: In CIR, Discharge date 1/26.   Rogers Blocker, PA-C 08/02/2023, 8:59 AM  [facility] Pager: [phone]

## 2023-08-03 LAB — GLUCOSE, CAPILLARY
Glucose-Capillary: 131 mg/dL — ABNORMAL HIGH (ref 70–99)
Glucose-Capillary: 141 mg/dL — ABNORMAL HIGH (ref 70–99)
Glucose-Capillary: 151 mg/dL — ABNORMAL HIGH (ref 70–99)

## 2023-08-03 NOTE — Progress Notes (Signed)
PROGRESS NOTE   Subjective/Complaints:  Pt doing well, slept ok-- woke up a few times to have a BM, but feels much better after having BMs. Pain manageable. LBM this morning. Dialysis went ok yesterday. Denies any other complaints or concerns. Ready to go home Monday!  ROS:  Denies fevers, chills, N/V, abdominal pain, diarrhea, SOB, cough, chest pain, new weakness or paraesthesias.  neck pain - well controlled    Objective:   No results found. Recent Labs    08/02/23 0913  WBC 7.5  HGB 9.3*  HCT 29.7*  PLT 444*    Recent Labs    08/02/23 0913  NA 130*  K 4.4  CL 92*  CO2 26  GLUCOSE 144*  BUN 18  CREATININE 5.88*  CALCIUM 9.1     Intake/Output Summary (Last 24 hours) at 08/03/2023 1104 Last data filed at 08/03/2023 0753 Gross per 24 hour  Intake 476 ml  Output 2000 ml  Net -1524 ml     Pressure Injury 07/18/23 Sacrum Mid Stage 1 -  Intact skin with non-blanchable redness of a localized area usually over a bony prominence. (Active)  07/18/23 1630 (Assessed by Ivor Messier, LPN and Becky, RN)  Location: Sacrum  Location Orientation: Mid  Staging: Stage 1 -  Intact skin with non-blanchable redness of a localized area usually over a bony prominence.  Wound Description (Comments):   Present on Admission: Yes    Physical Exam: Vital Signs Blood pressure (!) 151/65, pulse 73, temperature 98 F (36.7 C), temperature source Oral, resp. rate 18, height 6' (1.829 m), weight 64.1 kg, SpO2 100%.  Constitutional: No apparent distress. Appropriate appearance for age. Resting in bed.  HENT: Atraumatic, normocephalic. + Cervical collar--well-fitting. Eyes: Bilateral blindness-unchanged Cardiovascular: RRR, +murmur, no rub/gallops. No Edema. Peripheral pulses 2+  Respiratory: CTAB , No rales, rhonchi, or wheezing. On RA.  Abdomen: + bowel sounds normoactive.  Soft, nontender.  No distention.  Skin:  + PCDF site -no  drainage on overlying gauze, removed, dry serosanguineous fluid with intact Steri-Strips.  Well-approximated.-- not reassessed 1/25 + Anterior surgical site with Steri-Strips intact, no active drainage, clean and dry-- stable 1/25  PRIOR EXAMS:  MSK:      No apparent deformity.  Difficulty with bilateral shoulder abduction >110 degrees -improving tolerance although range of motion remains reduced    Neurologic exam:  AAOx3.  Follows all simple commands.  No apparent cognitive deficits.  Cranial nerves intact.  Bilateral stocking glove pattern sensory neuropathy. Strength 5- out of 5 throughout.   No tone, spasticity, or ataxia.   Good insight into current deficits         Assessment/Plan: 1. Functional deficits which require 3+ hours per day of interdisciplinary therapy in a comprehensive inpatient rehab setting. Physiatrist is providing close team supervision and 24 hour management of active medical problems listed below. Physiatrist and rehab team continue to assess barriers to discharge/monitor patient progress toward functional and medical goals  Care Tool:  Bathing    Body parts bathed by patient: Face, Chest, Abdomen   Body parts bathed by helper: Right arm, Left arm, Front perineal area, Right lower leg, Left upper leg, Right upper leg,  Buttocks, Left lower leg     Bathing assist Assist Level: Maximal Assistance - Patient 24 - 49%     Upper Body Dressing/Undressing Upper body dressing   What is the patient wearing?: Hospital gown only    Upper body assist Assist Level: Maximal Assistance - Patient 25 - 49%    Lower Body Dressing/Undressing Lower body dressing      What is the patient wearing?: Incontinence brief     Lower body assist Assist for lower body dressing: Total Assistance - Patient < 25%     Toileting Toileting    Toileting assist Assist for toileting: Maximal Assistance - Patient 25 - 49%     Transfers Chair/bed transfer  Transfers assist      Chair/bed transfer assist level: Supervision/Verbal cueing     Locomotion Ambulation   Ambulation assist      Assist level: Supervision/Verbal cueing Assistive device: Rollator Max distance: 350'   Walk 10 feet activity   Assist     Assist level: Supervision/Verbal cueing Assistive device: Rollator   Walk 50 feet activity   Assist Walk 50 feet with 2 turns activity did not occur: Safety/medical concerns  Assist level: Supervision/Verbal cueing Assistive device: Rollator    Walk 150 feet activity   Assist Walk 150 feet activity did not occur: Safety/medical concerns  Assist level: Supervision/Verbal cueing Assistive device: Rollator    Walk 10 feet on uneven surface  activity   Assist Walk 10 feet on uneven surfaces activity did not occur: Safety/medical concerns   Assist level: Contact Guard/Touching assist Assistive device: Rollator   Wheelchair     Assist Is the patient using a wheelchair?: No (ambulating to/from therapy gym) Type of Wheelchair: Manual    Wheelchair assist level: Dependent - Patient 0%      Wheelchair 50 feet with 2 turns activity    Assist        Assist Level: Dependent - Patient 0%   Wheelchair 150 feet activity     Assist      Assist Level: Dependent - Patient 0%  Plan: Blood pressure (!) 151/65, pulse 73, temperature 98 F (36.7 C), temperature source Oral, resp. rate 18, height 6' (1.829 m), weight 64.1 kg, SpO2 100%.  Medical Problem List and Plan: 1. 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 Per nursing no SLP needs.  - 1/21: Doing very well functionally. Progressing toward goals. Moving  DC date back to Saturday if can arrange Monday with OP dialysis -  now sunday 1/26  The patient is anticipated to be medically ready for discharge 08/04/23 to home and will need follow-up with [facility] PM[facility]R. In addition, they will need to follow up with their PCP, ID, Neurosurgery and Nephrology.   2.  Antithrombotics: -DVT/anticoagulation:  Pharmaceutical: Heparin 5000U q8h             -antiplatelet therapy: continue aspirin 325 mg daily   3. Pain Management:  -Tylenol as needed -Norco as needed 5-10 mg q4 hours- might need Oxy if not enough- was taking IV Dilaudid solely on Acute.              -  Flexeril 10 mg TID as needed   -Pain well-controlled on current regimen, continue. -1-14: Minimal use of PRNs.  Change Tylenol to 1000 mg 3 times daily as needed, reduce Norco to 5 mg every 6 hours as needed only for moderate to severe pain. 1-16: Rare use of hydrocodone, reduced to 2.5 to 5 mg twice daily as needed and DC at discharge.  4. Mood/Behavior/Sleep: LCSW to evaluate and provide emotional support             -continue melatonin 5 mg q HS             -antipsychotic agents: n/a   - Sleeping well-- 1/19 poor sleep last night, monitor for now  1/21: improved/resovled   5. Neuropsych/cognition: This patient is capable of making decisions on his own behalf.   6. Skin/Wound Care: Routine skin care checks             -monitor surgical incisions -1-14: Has been 1 week since postop, may remove PCDF honeycomb dressing.  Due to drainage, will have nursing take photo of this with dressing change. -- moderate serosanguinous drainage  07-25-15: Stable appearance, no further drainage on gauze, healing well  1/21: Change gauze daily to reduce moisture trapping under PSLF site; no concerning features for infection--staying clean 1-22, ensure daily changes.  1/23; posterior gauze has remained dry, remove and leave open to air today.--stable appearance   7. Fluids/Electrolytes/Nutrition: Strict Is and Os and  follow-up chemistries per nephrology  - Admission labs with stable hyponatremia.  Follow q. weekly.--Stable, unchanged  8: Hypertension: monitor TID and prn -continue amlodipine 5 mg daily>> appears it was d/c'd on admission? Changed to carvedilol 3.125mg  BID             -continue lisinopril 20 mg BID  - 1-10: Mild blood pressure elevations today.  Monitor for trend, continue increase in amlodipine. -07/20/23 BPs ok, monitor for now, appears amlodipine was changed to carvedilol during admission-- no med adjustments today -07/21/23 BPs a little variable, monitor trend today with dialysis, may need adjustments tomorrow 1/14: add back norvasc 5 mg dail, add Prn hydralazine 25 mg for SBP >180, DBP >110 1/15-16: Improving with norvasc; continue 1/17: Diastolic a little low, systolic much improved since starting Norvasc.  Monitor and can consider reduction to 2.5 mg daily if symptomatic.  -Blood pressures are somewhat variable with elevations into the low 160s, lows into the 120s to 130s, generally fluctuates with dialysis.  Continue current regimen. 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 Marisue Humble ok with this, reduced to 500mg  TID  12: DM-2: A1c =  5.8%; at home on Tresiba Flextouch 12u at bedtime             -continue SSI             -continue Semglee 2 units q HS    - 1/18-19/25 Well-controlled on current regimen -1-20: Hypoglycemic episode this a.m.; DC Semglee and change sliding scale to sensitive  1/21: BG improved; monitor - only getting 1-2 U SSI daily currently; can come off SSI at discharge  1-23: Single elevated blood glucose reading overnight, monitor  -08/03/23 CBGs doing well, cont regimen Recent Labs    08/02/23 1121 08/02/23 1903 08/02/23 2104  GLUCAP 130* 121* 153*     13: Constipation: continue MiraLax daily, Senokot-S BID; prns ordered-resolved  - 1-10: Positive results with enema overnight.  Increase Miralax to 17 g twice daily and Senokot 2S to 2 tabs twice daily.  Add docusate suppository for moderate constipation and enema for severe constipation.  No signs concerning for obstruction. -07/20/23 only small BMs recently, wants to try another soap suds enema, will do that to see if we decrease stool burden.  -07/21/23 enema yesterday helped significantly, cont meds for now, if oversoftened then would back off stool regimen.  1-13: Start bowel program as below.  Stools remain liquid, so we will DC daily MiraLAX and have Senokot as 2 hours before bowel program at night. - see #17  14: Discitis/osteomyelitis with Staph epidermidis bacteremia:  -continue vancomycin 750 mg with HD through 08/26/2023- will likely change to Chronic Doxycycline after that, but need to reach out to ID when close to d/c to verify-- -1/14: Seems to have missed dosed with Monday's dialysis, got additional dose today per pharmacy. 1/23: Reached out to Dr. Daiva Eves regarding transition plan --has appointment scheduled 1-28, they will take care of transition to oral medication.  Continue vancomycin with dialysis for the Monday after discharge.   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 Dutch Quint 1-13, may remove for bathing and changing collar only.             -follow-up with Dr. Jordan Likes  -1-22: Patient asking about cervical collar clearance.  Advised this usually takes at least 6 weeks, then outpatient follow-up.  Emphasized need for outpatient follow-up.   16: History of prior CVA: on aspirin and statin   17. Neurogenic bowel- resolved If doesn't work, will need soap suds enema, etc- might need KUB -See #13; had positive bowel movement with enema overnight, increasing bowel regimen and may need daily bowel program if no results in next 1 to 2 days - 1/13: 2x enema with BM; discussed with patient as above, will start nightly bowel program at 8 PM with Magic bullet suppository and digital stim with no results. 1-14.  Refused bowel program overnight.  Educated patient on importance of consistency with this, he is agreeable. 1/15: Patient now having daily, continent bowel movements. All bowel meds moved to PRN.  1-16.  Patient thinks he had incontinent bowel movement overnight, not recorded per nursing, will continue to monitor and may resume bowel program if further incontinent episodes.  Of note, patient states that he did not refuse suppository, claims he was never offered it.  1/15 LBM --increased sennakot S to 2 tabs BID -07/27/23 LBM 07/25/23, reduced  fosrenol as above, monitor to see if this helps with his constipation -07/28/23 multiple BMs yesterday into today, feeling better, cont regimen for now, hopefully things start to regulate better LBM 1/24; now Continent bowel movements -08/03/23 multiple BMs overnight, doing well, cont regimen for now    LOS: 16 days A FACE TO FACE EVALUATION WAS PERFORMED  [address] 08/03/2023, 11:04 AM

## 2023-08-03 NOTE — Progress Notes (Signed)
[facility] Progress Note   Subjective:   Had HD yesterday with 2L UF. Didn't sleep well, reports laxatives kicked in late last night. Otherwise no concerns, denies SOB, CP, dizziness.   Objective Vitals:   08/02/23 1700 08/02/23 1709 08/02/23 2002 08/03/23 0546  BP: (!) 148/71  (!) 153/79 (!) 151/65  Pulse: 90  (!) 101 73  Resp: 16  18 18   Temp:   98.1 F (36.7 C) 98 F (36.7 C)  TempSrc:   Oral Oral  SpO2: 100%  99% 100%  Weight:  61.5 kg  64.1 kg  Height:       Physical Exam General: Alert male in NAD Heart: RRR, no murmurs, rubs or gallops Lungs: CTA bilaterally, respirations unlabored on RA Abdomen: Soft, non-distended, +BS Extremities: no edema b/l lower extremities Dialysis Access:  [facility]  Additional Objective Labs: Basic Metabolic Panel: Recent Labs  Lab 07/29/23 1028 08/02/23 0913  NA 128* 130*  K 4.9 4.4  CL 90* 92*  CO2 26 26  GLUCOSE 57* 144*  BUN 26* 18  CREATININE 7.66* 5.88*  CALCIUM 8.9 9.1  PHOS 3.4 3.8   Liver Function Tests: Recent Labs  Lab 07/29/23 1028 08/02/23 0913  ALBUMIN 2.8* 3.0*   No results for input(s): "LIPASE", "AMYLASE" in the last 168 hours. CBC: Recent Labs  Lab 07/29/23 1028 08/02/23 0913  WBC 7.4 7.5  HGB 9.2* 9.3*  HCT 28.8* 29.7*  MCV 83.5 84.6  PLT 432* 444*   Blood Culture    Component Value Date/Time   SDES BLOOD RIGHT ARM 07/11/2023 1549   SPECREQUEST  07/11/2023 1549    BOTTLES DRAWN AEROBIC ONLY Blood Culture results may not be optimal due to an inadequate volume of blood received in culture bottles   CULT  07/11/2023 1549    NO GROWTH 5 DAYS Performed at [facility], [address], [place], [place] [address]    REPTSTATUS 07/16/2023 FINAL 07/11/2023 1549    Cardiac Enzymes: No results for input(s): "CKTOTAL", "CKMB", "CKMBINDEX", "TROPONINI" in the last 168 hours. CBG: Recent Labs  Lab 08/01/23 2147 08/02/23 0705 08/02/23 1121 08/02/23 1903 08/02/23 2104  GLUCAP  205* 146* 130* 121* 153*   Iron Studies: No results for input(s): "IRON", "TIBC", "TRANSFERRIN", "FERRITIN" in the last 72 hours. @lablastinr3 @ Studies/Results: No results found. Medications:  heparin sodium (porcine)     vancomycin (VANCOCIN) 750 mg in sodium chloride 0.9 % 250 mL IVPB 750 mg (08/02/23 1548)    (feeding supplement) PROSource Plus  30 mL Oral BID BM   amLODipine  5 mg Oral Daily   aspirin EC  325 mg Oral QHS   atorvastatin  80 mg Oral Daily   brimonidine  1 drop Both Eyes BID   calcitRIOL  0.5 mcg Oral Q M,W,F   carvedilol  3.125 mg Oral BID WC   Chlorhexidine Gluconate Cloth  6 each Topical Q12H   darbepoetin (ARANESP) injection - DIALYSIS  100 mcg Subcutaneous Q Wed-1800   dorzolamide-timolol  1 drop Both Eyes BID   heparin  5,000 Units Subcutaneous Q8H   insulin aspart  0-6 Units Subcutaneous TID WC   lanthanum  500 mg Oral TID WC   latanoprost  1 drop Both Eyes QHS   lisinopril  20 mg Oral BID   melatonin  5 mg Oral QHS   senna-docusate  2 tablet Oral BID    Dialysis Orders: MWF - [facility] 3:30hr, 400/A1.5, EDW 69.9kg, 2K/2.5Ca, TDC, heparin 6500 unit bolus +  2900 unit mid-run bolus - Mircera IV q 2 weeks - last 12/22 - Calcitriol 0.10mcg PO q HD  Assessment/Plan: Cervical discitis/osteomyelitis: Blood Cx + MRSE. 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 ESRD:  Continue MWF HD schedule, next HD Monday HTN/volume: BP slightly elevated today, usually well controlled. Continue current regimen. Well below prior EDW, lower on d/c. 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 Lanthanum dose reduced this admission Nutrition: Albumin low, continue supplements and ordered diet Dispo: In CIR, Discharge date 1/26.   Rogers Blocker, PA-C 08/03/2023, 8:46 AM  [facility] Pager: [phone]

## 2023-08-03 NOTE — Progress Notes (Signed)
Inpatient Rehabilitation Discharge Medication Review by a Pharmacist  A complete drug regimen review was completed for this patient to identify any potential clinically significant medication issues.  High Risk Drug Classes Is patient taking? Indication by Medication  Antipsychotic No   Anticoagulant No   Antibiotic Yes, as an intravenous medication Vancomycin: discitis/osteomyelitis  Opioid No   Antiplatelet Yes Aspirin: CVA ppx  Hypoglycemics/insulin No   Vasoactive Medication Yes Amlodipine, Coreg, Lisinopril: HTN  Chemotherapy No   Other Yes Bisacodyl, miralax, Senokot: constipation Tylenol: pain Fosrenol, Calcitriol: ESRD on HD, secondary HPTH Lipitor: HLD Brimonidine, Cosopt, Xalatan: retinopathy Aranesp: anemia of ESRD Melatonin: sleep     Type of Medication Issue Identified Description of Issue Recommendation(s)  Drug Interaction(s) (clinically significant)     Duplicate Therapy     Allergy     No Medication Administration End Date     Incorrect Dose     Additional Drug Therapy Needed     Significant med changes from prior encounter (inform family/care partners about these prior to discharge). Crestor changed to Lipitor Restart or discontinue as appropriate. Communicate medication changes with patient/family at discharge  Other       Clinically significant medication issues were identified that warrant physician communication and completion of prescribed/recommended actions by midnight of the next day:  No   Time spent performing this drug regimen review (minutes): 30   Thank you for allowing pharmacy to be a part of this patient's care.   Signe Colt, PharmD 08/03/2023 8:10 AM  **Pharmacist phone directory can be found on [email or web address] listed under [facility]**

## 2023-08-03 NOTE — Progress Notes (Signed)
Occupational Therapy Session Note  Patient Details  Name: Shawn [name] MRN: [id number] Date of Birth: 07/25/1960  Today's Date: 08/03/2023 OT Individual Time: 0905-1000 OT Individual Time Calculation (min): 55 min    Short Term Goals: Week 2:  OT Short Term Goal 1 (Week 2): STG=LTG's based on LOS  Skilled Therapeutic Interventions/Progress Updates:  Pt greeted resting in bed for skilled OT session with focus on BADL participation and general conditioning.   Pain: Pt with un-rated pain at collar site, readjusted for comfort. OT offering intermediate rest breaks and positioning suggestions throughout session to address pain/fatigue and maximize participation/safety in session.   Functional Transfers: Sit<>Stands with CGA (no AD) & close supervision + Rollator. Room<>day room ambulation with assistance provided for environmental navigation through gentle guiding of rollator + verbal cuing.   Self Care Tasks: Pt manages UB dressing with setup of clothing item, CGA-supervision for LB garments. Footwear donned with item retrieval. Standing sink-side grooming with setup of needed items.   Therapeutic Exercise: Pt instructed in chest press and bicep curls (2x10 reps) with use of 3# dowel bar for BUE strengthening/endurance. Education provided on use of household items (ie. Water bottle) for carryover of exercises upon discharge.   Pt remained sitting in recliner with 4Ps assessed and immediate needs met. Pt continues to be appropriate for skilled OT intervention to promote further functional independence in ADLs/IADLs.   Therapy Documentation Precautions:  Precautions Precautions: Fall, Cervical Precaution Comments: pt blind; functional in own enviroment; cervical brace OOB to be aplied in sitting Required Braces or Orthoses: Cervical Brace Cervical Brace: Hard collar Restrictions Weight Bearing Restrictions Per Provider Order: No   Therapy/Group: Individual Therapy  Lou Cal, OTR/L, MSOT  08/03/2023, 5:47 AM

## 2023-08-03 NOTE — Plan of Care (Signed)
  Problem: RH Balance Goal: LTG: Patient will maintain dynamic sitting balance (OT) Description: LTG:  Patient will maintain dynamic sitting balance with assistance during activities of daily living (OT) Outcome: Completed/Met Goal: LTG Patient will maintain dynamic standing with ADLs (OT) Description: LTG:  Patient will maintain dynamic standing balance with assist during activities of daily living (OT)  Outcome: Completed/Met   Problem: Sit to Stand Goal: LTG:  Patient will perform sit to stand in prep for activites of daily living with assistance level (OT) Description: LTG:  Patient will perform sit to stand in prep for activites of daily living with assistance level (OT) Outcome: Completed/Met   Problem: RH Eating Goal: LTG Patient will perform eating w/assist, cues/equip (OT) Description: LTG: Patient will perform eating with assist, with/without cues using equipment (OT) Outcome: Completed/Met   Problem: RH Grooming Goal: LTG Patient will perform grooming w/assist,cues/equip (OT) Description: LTG: Patient will perform grooming with assist, with/without cues using equipment (OT) Outcome: Completed/Met   Problem: RH Bathing Goal: LTG Patient will bathe all body parts with assist levels (OT) Description: LTG: Patient will bathe all body parts with assist levels (OT) Outcome: Completed/Met   Problem: RH Dressing Goal: LTG Patient will perform upper body dressing (OT) Description: LTG Patient will perform upper body dressing with assist, with/without cues (OT). Outcome: Completed/Met Goal: LTG Patient will perform lower body dressing w/assist (OT) Description: LTG: Patient will perform lower body dressing with assist, with/without cues in positioning using equipment (OT) Outcome: Completed/Met   Problem: RH Toileting Goal: LTG Patient will perform toileting task (3/3 steps) with assistance level (OT) Description: LTG: Patient will perform toileting task (3/3 steps) with  assistance level (OT)  Outcome: Completed/Met   Problem: RH Functional Use of Upper Extremity Goal: LTG Patient will use RT/LT upper extremity as a (OT) Description: LTG: Patient will use right/left upper extremity as a stabilizer/gross assist/diminished/nondominant/dominant level with assist, with/without cues during functional activity (OT) Outcome: Completed/Met   Problem: RH Toilet Transfers Goal: LTG Patient will perform toilet transfers w/assist (OT) Description: LTG: Patient will perform toilet transfers with assist, with/without cues using equipment (OT) Outcome: Completed/Met   Problem: RH Tub/Shower Transfers Goal: LTG Patient will perform tub/shower transfers w/assist (OT) Description: LTG: Patient will perform tub/shower transfers with assist, with/without cues using equipment (OT) Outcome: Not Applicable Note: Pt sponge-bathing at baseline.

## 2023-08-03 NOTE — Progress Notes (Signed)
Physical Therapy Session Note  Patient Details  Name: Shawn [name] MRN: [id number] Date of Birth: 05/21/1961  Today's Date: 08/03/2023 PT Individual Time: 1115-1200 PT Individual Time Calculation (min): 45 min   Short Term Goals: Week 2:  PT Short Term Goal 1 (Week 2): Pt will ambulate 150' with LRAD CGA PT Short Term Goal 2 (Week 2): Pt willl complete transfer with RW CGA PT Short Term Goal 3 (Week 2): Pt will maintain dynamic standing balance during functional task with supervision  Skilled Therapeutic Interventions/Progress Updates:    pt received in bed and agreeable to therapy. Pt reports 6/10 lateral neck pain that has been present since this morning, he has already discussed with PA, no further intervention required.   Bed mobility with bed features mod I. Pt able to don shoes with set up a. Min a required to stand from low bed. Pt then ambulated with rollator with supervision and assist with navigating/steering rollator d/t visual impairments.   Pt utilized nustep x 5 min at level 10 (pt selected) reports mild back pain, but relieved with adjusting position. Pt reports "out of gas," so shifted to Sit to stand practice for BLE strengthening, required 2-3 minute rest breaks for muscle recovery. Performed 3 x 10. During rest breaks, discussed plan for home and home set up, and fall prevention strategies.   Pt then returned to room and to bed with supervision, was left with all needs in reach and alarm active. .       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

## 2023-08-04 LAB — GLUCOSE, CAPILLARY: Glucose-Capillary: 186 mg/dL — ABNORMAL HIGH (ref 70–99)

## 2023-08-04 NOTE — Discharge Planning (Signed)
[facility] Patient Discharge Orders- [facility] CLINIC: [place]  Patient's name: Shawn [name] Admit/DC Dates: 07/18/2023 - 08/04/23  Discharge Diagnoses: Cervical discitis/osteomyelitis   S/p spinal fusion on 07/15/23 Debility s/p CIR  Aranesp: Given: Yes   Date and amount of last dose: on 1/ 22/25 Last Hgb: 9.3 PRBC's Given: no Date/# of units: N/A ESA dose for discharge: mircera 150 mcg IV q 2 weeks  IV Iron dose at discharge: none  Heparin change: no  EDW Change: Yes New EDW: 64kg  Bath Change: no  Access intervention/Change: Yes Details: TDC exchange  Hectorol/Calcitriol change: no  Discharge Labs: Calcium 9.1 Phosphorus 3.8 Albumin 3.0 K+ 4.4  IV Antibiotics: Yes Details: vancomycin 750mg  IV q HD -stop date 08/26/23. Weekly vanc trough  On Coumadin?: no Last INR: Next INR: Managed By:   OTHER/APPTS/LAB ORDERS:    D/C Meds to be reconciled by nurse after every discharge.  Completed By: Rogers Blocker, PA-C 08/04/2023, 10:15 AM  [facility] Pager: [phone]    Reviewed by: MD:______ RN_______

## 2023-08-04 NOTE — Progress Notes (Signed)
PROGRESS NOTE   Subjective/Complaints:  Pt doing well again today, ready to go home! Slept ok, pain manageable, LBM was yesterday early morning. Denies any other complaints or concerns.   ROS:  Denies fevers, chills, N/V, abdominal pain, diarrhea, SOB, cough, chest pain, new weakness or paraesthesias.  neck pain - well controlled    Objective:   No results found. Recent Labs    08/02/23 0913  WBC 7.5  HGB 9.3*  HCT 29.7*  PLT 444*    Recent Labs    08/02/23 0913  NA 130*  K 4.4  CL 92*  CO2 26  GLUCOSE 144*  BUN 18  CREATININE 5.88*  CALCIUM 9.1     Intake/Output Summary (Last 24 hours) at 08/04/2023 1102 Last data filed at 08/04/2023 0816 Gross per 24 hour  Intake 720 ml  Output --  Net 720 ml     Pressure Injury 07/18/23 Sacrum Mid Stage 1 -  Intact skin with non-blanchable redness of a localized area usually over a bony prominence. (Active)  07/18/23 1630 (Assessed by Ivor Messier, LPN and Becky, RN)  Location: Sacrum  Location Orientation: Mid  Staging: Stage 1 -  Intact skin with non-blanchable redness of a localized area usually over a bony prominence.  Wound Description (Comments):   Present on Admission: Yes    Physical Exam: Vital Signs Blood pressure (!) 153/73, pulse 67, temperature 97.8 F (36.6 C), resp. rate 18, height 6' (1.829 m), weight 64.1 kg, SpO2 100%.  Constitutional: No apparent distress. Appropriate appearance for age. Resting in bed.  HENT: Atraumatic, normocephalic. + Cervical collar--well-fitting. Eyes: Bilateral blindness-unchanged Cardiovascular: RRR, +murmur, no rub/gallops. No Edema. Peripheral pulses 2+  Respiratory: CTAB , No rales, rhonchi, or wheezing. On RA.  Abdomen: + bowel sounds normoactive.  Soft, nontender.  No distention.  Skin:  + PCDF site -no drainage on overlying gauze, removed, dry serosanguineous fluid with intact Steri-Strips.  Well-approximated.-- not  reassessed 1/26 + Anterior surgical site with Steri-Strips intact, no active drainage, clean and dry-- stable 1/26  PRIOR EXAMS:  MSK:      No apparent deformity.  Difficulty with bilateral shoulder abduction >110 degrees -improving tolerance although range of motion remains reduced    Neurologic exam:  AAOx3.  Follows all simple commands.  No apparent cognitive deficits.  Cranial nerves intact.  Bilateral stocking glove pattern sensory neuropathy. Strength 5- out of 5 throughout.   No tone, spasticity, or ataxia.   Good insight into current deficits         Assessment/Plan: 1. Functional deficits which require 3+ hours per day of interdisciplinary therapy in a comprehensive inpatient rehab setting. Physiatrist is providing close team supervision and 24 hour management of active medical problems listed below. Physiatrist and rehab team continue to assess barriers to discharge/monitor patient progress toward functional and medical goals  Care Tool:  Bathing    Body parts bathed by patient: Face, Chest, Abdomen, Right arm, Left arm, Front perineal area, Buttocks, Right upper leg, Left upper leg, Right lower leg, Left lower leg   Body parts bathed by helper: Right arm, Left arm, Front perineal area, Right lower leg, Left upper leg, Right upper leg,  Buttocks, Left lower leg     Bathing assist Assist Level: Supervision/Verbal cueing     Upper Body Dressing/Undressing Upper body dressing   What is the patient wearing?: Pull over shirt    Upper body assist Assist Level: Supervision/Verbal cueing    Lower Body Dressing/Undressing Lower body dressing      What is the patient wearing?: Pants     Lower body assist Assist for lower body dressing: Supervision/Verbal cueing     Toileting Toileting    Toileting assist Assist for toileting: Supervision/Verbal cueing     Transfers Chair/bed transfer  Transfers assist     Chair/bed transfer assist level: Supervision/Verbal  cueing     Locomotion Ambulation   Ambulation assist      Assist level: Supervision/Verbal cueing Assistive device: Rollator Max distance: 350'   Walk 10 feet activity   Assist     Assist level: Supervision/Verbal cueing Assistive device: Rollator   Walk 50 feet activity   Assist Walk 50 feet with 2 turns activity did not occur: Safety/medical concerns  Assist level: Supervision/Verbal cueing Assistive device: Rollator    Walk 150 feet activity   Assist Walk 150 feet activity did not occur: Safety/medical concerns  Assist level: Supervision/Verbal cueing Assistive device: Rollator    Walk 10 feet on uneven surface  activity   Assist Walk 10 feet on uneven surfaces activity did not occur: Safety/medical concerns   Assist level: Contact Guard/Touching assist Assistive device: Rollator   Wheelchair     Assist Is the patient using a wheelchair?: No (ambulating to/from therapy gym) Type of Wheelchair: Manual    Wheelchair assist level: Dependent - Patient 0%      Wheelchair 50 feet with 2 turns activity    Assist        Assist Level: Dependent - Patient 0%   Wheelchair 150 feet activity     Assist      Assist Level: Dependent - Patient 0%  Plan: Blood pressure (!) 153/73, pulse 67, temperature 97.8 F (36.6 C), resp. rate 18, height 6' (1.829 m), weight 64.1 kg, SpO2 100%.  Medical Problem List and Plan: 1. 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 Per nursing no SLP needs.  - 1/21: Doing very well functionally. Progressing toward goals. Moving DC date back to Saturday if can arrange Monday with OP dialysis -  now sunday  1/26 -08/04/23 d/c today, med list reviewed, questions answered.   The patient is anticipated to be medically ready for discharge 08/04/23 to home and will need follow-up with [facility] PM&R. In addition, they will need to follow up with their PCP, ID, Neurosurgery and Nephrology.   2.  Antithrombotics: -DVT/anticoagulation:  Pharmaceutical: Heparin 5000U q8h             -antiplatelet therapy: continue aspirin 325 mg daily   3. Pain Management:  -Tylenol as needed -Norco as needed 5-10 mg q4 hours- might need Oxy if not enough- was taking IV Dilaudid solely on Acute.              -Flexeril 10 mg TID as needed   -  Pain well-controlled on current regimen, continue. -1-14: Minimal use of PRNs.  Change Tylenol to 1000 mg 3 times daily as needed, reduce Norco to 5 mg every 6 hours as needed only for moderate to severe pain. 1-16: Rare use of hydrocodone, reduced to 2.5 to 5 mg twice daily as needed and DC at discharge.  4. Mood/Behavior/Sleep: LCSW to evaluate and provide emotional support             -continue melatonin 5 mg q HS             -antipsychotic agents: n/a   - Sleeping well-- 1/19 poor sleep last night, monitor for now  1/21: improved/resovled   5. Neuropsych/cognition: This patient is capable of making decisions on his own behalf.   6. Skin/Wound Care: Routine skin care checks             -monitor surgical incisions -1-14: Has been 1 week since postop, may remove PCDF honeycomb dressing.  Due to drainage, will have nursing take photo of this with dressing change. -- moderate serosanguinous drainage  07-25-15: Stable appearance, no further drainage on gauze, healing well  1/21: Change gauze daily to reduce moisture trapping under PSLF site; no concerning features for infection--staying clean 1-22, ensure daily changes.  1/23; posterior gauze has remained dry, remove and leave open to air today.--stable appearance   7. Fluids/Electrolytes/Nutrition: Strict Is and Os and follow-up  chemistries per nephrology  - Admission labs with stable hyponatremia.  Follow q. weekly.--Stable, unchanged  8: Hypertension: monitor TID and prn -continue amlodipine 5 mg daily>> appears it was d/c'd on admission? Changed to carvedilol 3.125mg  BID             -continue lisinopril 20 mg BID  - 1-10: Mild blood pressure elevations today.  Monitor for trend, continue increase in amlodipine. -07/20/23 BPs ok, monitor for now, appears amlodipine was changed to carvedilol during admission-- no med adjustments today -07/21/23 BPs a little variable, monitor trend today with dialysis, may need adjustments tomorrow 1/14: add back norvasc 5 mg dail, add Prn hydralazine 25 mg for SBP >180, DBP >110 1/15-16: Improving with norvasc; continue 1/17: Diastolic a little low, systolic much improved since starting Norvasc.  Monitor and can consider reduction to 2.5 mg daily if symptomatic.  -Blood pressures are somewhat variable with elevations into the low 160s, lows into the 120s to 130s, generally fluctuates with dialysis.  Continue current regimen. 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 Marisue Humble ok with this, reduced to 500mg  TID   12: DM-2: A1c = 5.8%; at  home on  Evaristo Bury Flextouch 12u at bedtime             -continue SSI             -continue Semglee 2 units q HS    - 1/18-19/25 Well-controlled on current regimen -1-20: Hypoglycemic episode this a.m.; DC Semglee and change sliding scale to sensitive  1/21: BG improved; monitor - only getting 1-2 U SSI daily currently; can come off SSI at discharge  1-23: Single elevated blood glucose reading overnight, monitor  -1/25-26/25 CBGs doing well, cont regimen Recent Labs    08/03/23 1628 08/03/23 2049 08/04/23 0600  GLUCAP 141* 151* 186*     13: Constipation: continue MiraLax daily, Senokot-S BID; prns ordered-resolved  - 1-10: Positive results with enema overnight.  Increase Miralax to 17 g twice daily and Senokot 2S to 2 tabs twice daily.  Add docusate suppository for moderate constipation and enema for severe constipation.  No signs concerning for obstruction. -07/20/23 only small BMs recently, wants to try another soap suds enema, will do that to see if we decrease stool burden.  -07/21/23 enema yesterday helped significantly, cont meds for now, if oversoftened then would back off stool regimen.  1-13: Start bowel program as below.  Stools remain liquid, so we will DC daily MiraLAX and have Senokot as 2 hours before bowel program at night. - see #17  14: Discitis/osteomyelitis with Staph epidermidis bacteremia:  -continue vancomycin 750 mg with HD through 08/26/2023- will likely change to Chronic Doxycycline after that, but need to reach out to ID when close to d/c to verify-- -1/14: Seems to have missed dosed with Monday's dialysis, got additional dose today per pharmacy. 1/23: Reached out to Dr. Daiva Eves regarding transition plan --has appointment scheduled 1-28, they will take care of transition to oral medication.  Continue vancomycin with dialysis for the Monday after discharge.   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 Dutch Quint 1-13, may remove for bathing and changing collar only.             -follow-up with Dr. Jordan Likes  -1-22: Patient asking about cervical collar clearance.  Advised this usually takes at least 6 weeks, then outpatient follow-up.  Emphasized need for outpatient follow-up.   16: History of prior CVA: on aspirin and statin   17. Neurogenic bowel- resolved If doesn't work, will need soap suds enema, etc- might need KUB -See #13; had positive bowel movement with enema overnight, increasing bowel regimen and may need daily bowel program if no results in next 1 to 2 days - 1/13: 2x enema with BM; discussed with patient as above, will start nightly bowel program at 8 PM with Magic bullet suppository and digital stim with no results. 1-14.  Refused bowel program overnight.  Educated patient on importance of consistency with this, he is agreeable. 1/15: Patient now having daily, continent bowel movements. All bowel meds moved to PRN.  1-16.  Patient thinks he had incontinent bowel movement overnight, not recorded per nursing, will continue to monitor and may resume bowel program if further incontinent episodes.  Of note, patient states that he did not refuse suppository, claims he was never offered it.  1/15 LBM --increased sennakot S to 2 tabs BID -07/27/23 LBM 07/25/23, reduced fosrenol as above, monitor to see if this helps  with his constipation -07/28/23 multiple BMs yesterday into today, feeling better, cont regimen for now, hopefully things start to regulate better LBM 1/24; now Continent bowel movements -08/03/23 multiple BMs overnight, doing well, cont regimen for now -08/04/23 LBM yesterday morning, cont outpatient management    LOS: 17 days A FACE TO FACE EVALUATION WAS PERFORMED  [address] 08/04/2023, 11:02 AM

## 2023-08-04 NOTE — Progress Notes (Signed)
[facility] Progress Note   Subjective:   Pt reports he is happy to be going home today. Denies SOB, CP, dizziness, nausea.  Objective Vitals:   08/03/23 0546 08/03/23 1311 08/03/23 2013 08/04/23 0556  BP: (!) 151/65 129/63 (!) 143/60 (!) 153/73  Pulse: 73 73 71 67  Resp: 18 18 18 18   Temp: 98 F (36.7 C) 98.1 F (36.7 C) (!) 97.5 F (36.4 C) 97.8 F (36.6 C)  TempSrc: Oral Oral    SpO2: 100% 100% 99% 100%  Weight: 64.1 kg     Height:       Physical Exam General: Alert male in NAD Heart: RRR, no murmurs, rubs or gallops Lungs: CTA bilaterally, respirations unlabored on RA Abdomen: Soft, non-distended, +BS Extremities: no edema b/l lower extremities Dialysis Access:  [facility]  Additional Objective Labs: Basic Metabolic Panel: Recent Labs  Lab 07/29/23 1028 08/02/23 0913  NA 128* 130*  K 4.9 4.4  CL 90* 92*  CO2 26 26  GLUCOSE 57* 144*  BUN 26* 18  CREATININE 7.66* 5.88*  CALCIUM 8.9 9.1  PHOS 3.4 3.8   Liver Function Tests: Recent Labs  Lab 07/29/23 1028 08/02/23 0913  ALBUMIN 2.8* 3.0*   No results for input(s): "LIPASE", "AMYLASE" in the last 168 hours. CBC: Recent Labs  Lab 07/29/23 1028 08/02/23 0913  WBC 7.4 7.5  HGB 9.2* 9.3*  HCT 28.8* 29.7*  MCV 83.5 84.6  PLT 432* 444*   Blood Culture    Component Value Date/Time   SDES BLOOD RIGHT ARM 07/11/2023 1549   SPECREQUEST  07/11/2023 1549    BOTTLES DRAWN AEROBIC ONLY Blood Culture results may not be optimal due to an inadequate volume of blood received in culture bottles   CULT  07/11/2023 1549    NO GROWTH 5 DAYS Performed at [facility], [address], [place], [place] [address]    REPTSTATUS 07/16/2023 FINAL 07/11/2023 1549    Cardiac Enzymes: No results for input(s): "CKTOTAL", "CKMB", "CKMBINDEX", "TROPONINI" in the last 168 hours. CBG: Recent Labs  Lab 08/02/23 2104 08/03/23 1107 08/03/23 1628 08/03/23 2049 08/04/23 0600  GLUCAP 153* 131* 141* 151*  186*   Iron Studies: No results for input(s): "IRON", "TIBC", "TRANSFERRIN", "FERRITIN" in the last 72 hours. @lablastinr3 @ Studies/Results: No results found. Medications:  heparin sodium (porcine)     vancomycin (VANCOCIN) 750 mg in sodium chloride 0.9 % 250 mL IVPB 750 mg (08/02/23 1548)    (feeding supplement) PROSource Plus  30 mL Oral BID BM   amLODipine  5 mg Oral Daily   aspirin EC  325 mg Oral QHS   atorvastatin  80 mg Oral Daily   brimonidine  1 drop Both Eyes BID   calcitRIOL  0.5 mcg Oral Q M,W,F   carvedilol  3.125 mg Oral BID WC   Chlorhexidine Gluconate Cloth  6 each Topical Q12H   darbepoetin (ARANESP) injection - DIALYSIS  100 mcg Subcutaneous Q Wed-1800   dorzolamide-timolol  1 drop Both Eyes BID   heparin  5,000 Units Subcutaneous Q8H   insulin aspart  0-6 Units Subcutaneous TID WC   lanthanum  500 mg Oral TID WC   latanoprost  1 drop Both Eyes QHS   lisinopril  20 mg Oral BID   melatonin  5 mg Oral QHS   senna-docusate  2 tablet Oral BID    Dialysis Orders: MWF - [facility] 3:30hr, 400/A1.5, EDW 69.9kg, 2K/2.5Ca, TDC, heparin 6500 unit bolus + 2900 unit mid-run  bolus - Mircera IV q 2 weeks - last 12/22 - Calcitriol 0.63mcg PO q HD    Assessment/Plan: Cervical discitis/osteomyelitis: Blood Cx + MRSE. 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 ESRD:  Continue MWF HD schedule, next HD Monday HTN/volume: BP slightly elevated today, usually well controlled. Continue current regimen. Well below prior EDW, lower on d/c. 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 Lanthanum dose reduced this admission Nutrition: Albumin low, continue supplements Dispo: In CIR, Discharge date 1/26.   Rogers Blocker, PA-C 08/04/2023, 9:33 AM  [facility] Pager: [phone]

## 2023-08-05 ENCOUNTER — Telehealth: Payer: Self-pay

## 2023-08-05 DIAGNOSIS — N2581 Secondary hyperparathyroidism of renal origin: Secondary | ICD-10-CM | POA: Diagnosis not present

## 2023-08-05 DIAGNOSIS — D509 Iron deficiency anemia, unspecified: Secondary | ICD-10-CM | POA: Diagnosis not present

## 2023-08-05 DIAGNOSIS — D631 Anemia in chronic kidney disease: Secondary | ICD-10-CM | POA: Diagnosis not present

## 2023-08-05 DIAGNOSIS — E876 Hypokalemia: Secondary | ICD-10-CM | POA: Diagnosis not present

## 2023-08-05 DIAGNOSIS — E119 Type 2 diabetes mellitus without complications: Secondary | ICD-10-CM | POA: Diagnosis not present

## 2023-08-05 DIAGNOSIS — Z992 Dependence on renal dialysis: Secondary | ICD-10-CM | POA: Diagnosis not present

## 2023-08-05 DIAGNOSIS — M8610 Other acute osteomyelitis, unspecified site: Secondary | ICD-10-CM | POA: Diagnosis not present

## 2023-08-05 DIAGNOSIS — N186 End stage renal disease: Secondary | ICD-10-CM | POA: Diagnosis not present

## 2023-08-05 NOTE — Progress Notes (Signed)
Inpatient Rehabilitation Care Coordinator Discharge Note   Patient Details  Name: Shawn [name] MRN: [id number] Date of Birth: Nov 09, 1960   Discharge location: D/c tohome with his wife  Length of Stay: 17 days  Discharge activity level: Supervision  Home/community participation: Limited  Patient response WU:XLKGMW Literacy - How often do you need to have someone help you when you read instructions, pamphlets, or other written material from your doctor or pharmacy?: Never  Patient response NU:UVOZDG Isolation - How often do you feel lonely or isolated from those around you?: Never  Services provided included: RD, PT, MD, OT, RN, CM, TR, Pharmacy, Neuropsych, SW  Financial Services:  Field seismologist Utilized: Medicare    Choices offered to/list presented to: patient  Follow-up services arranged:  Home Health Home Health Agency: [facility] for HHPT/OT/aide         Patient response to transportation need: Is the patient able to respond to transportation needs?: Yes In the past 12 months, has lack of transportation kept you from medical appointments or from getting medications?: No In the past 12 months, has lack of transportation kept you from meetings, work, or from getting things needed for daily living?: No   Patient/Family verbalized understanding of follow-up arrangements:  Yes  Individual responsible for coordination of the follow-up plan: contact pt [phone]  Confirmed correct DME delivered: Gretchen Short 08/05/2023    Comments (or additional information):fam edu completed  Summary of Stay    Date/Time Discharge Planning CSW  07/29/23 1307 Pt will d/c to home with his wife who wil be primary caregiver.  Scheduled fam edu on Monday (1/20) 10am-12pm with pt wife. [facility] for HHPT/OT/aide. SW will confirm there are no barriers to discharge. AAC  07/22/23 1525 Pt will d/c to home with his wife who wil be primary caregiver. SW will  confirm there are no barriers to discharge. AAC       Ayala Ribble A Lula Olszewski

## 2023-08-05 NOTE — Telephone Encounter (Signed)
Transitional Care call--patient    Are you/is patient experiencing any problems since coming home? No Are there any questions regarding any aspect of care? No Are there any questions regarding medications administration/dosing? No Are meds being taken as prescribed? Yes Patient should review meds with caller to confirm Have there been any falls? No Has Home Health been to the house and/or have they contacted you? Yes If not, have you tried to contact them? Can we help you contact them? Are bowels and bladder emptying properly? Yes Are there any unexpected incontinence issues? On dialysis If applicable, is patient following bowel/bladder programs? On dialysis Any fevers, problems with breathing, unexpected pain? No Are there any skin problems or new areas of breakdown? No  Has the patient/family member arranged specialty MD follow up (ie cardiology/neurology/renal/surgical/etc)? Yes   Can we help arrange? Does the patient need any other services or support that we can help arrange? No Are caregivers following through as expected in assisting the patient? Yes Has the patient quit smoking, drinking alcohol, or using drugs as recommended? No  Appointment time 10:40, arrive time 10:20 am with Dr. Shearon Stalls  [address] [phone]

## 2023-08-06 ENCOUNTER — Other Ambulatory Visit: Payer: Self-pay

## 2023-08-06 ENCOUNTER — Ambulatory Visit (INDEPENDENT_AMBULATORY_CARE_PROVIDER_SITE_OTHER): Payer: Medicare Other | Admitting: Infectious Disease

## 2023-08-06 ENCOUNTER — Encounter: Payer: Self-pay | Admitting: Infectious Disease

## 2023-08-06 ENCOUNTER — Telehealth: Payer: Self-pay | Admitting: Nephrology

## 2023-08-06 VITALS — BP 150/74 | HR 92 | Resp 16 | Ht 72.0 in | Wt 150.0 lb

## 2023-08-06 DIAGNOSIS — K5900 Constipation, unspecified: Secondary | ICD-10-CM | POA: Diagnosis not present

## 2023-08-06 DIAGNOSIS — N186 End stage renal disease: Secondary | ICD-10-CM | POA: Diagnosis not present

## 2023-08-06 DIAGNOSIS — Z7982 Long term (current) use of aspirin: Secondary | ICD-10-CM | POA: Diagnosis not present

## 2023-08-06 DIAGNOSIS — M4642 Discitis, unspecified, cervical region: Secondary | ICD-10-CM

## 2023-08-06 DIAGNOSIS — Z23 Encounter for immunization: Secondary | ICD-10-CM | POA: Diagnosis not present

## 2023-08-06 DIAGNOSIS — I120 Hypertensive chronic kidney disease with stage 5 chronic kidney disease or end stage renal disease: Secondary | ICD-10-CM | POA: Diagnosis not present

## 2023-08-06 DIAGNOSIS — Z794 Long term (current) use of insulin: Secondary | ICD-10-CM | POA: Diagnosis not present

## 2023-08-06 DIAGNOSIS — M4802 Spinal stenosis, cervical region: Secondary | ICD-10-CM | POA: Diagnosis not present

## 2023-08-06 DIAGNOSIS — D631 Anemia in chronic kidney disease: Secondary | ICD-10-CM | POA: Diagnosis not present

## 2023-08-06 DIAGNOSIS — Z992 Dependence on renal dialysis: Secondary | ICD-10-CM

## 2023-08-06 DIAGNOSIS — Z7185 Encounter for immunization safety counseling: Secondary | ICD-10-CM | POA: Diagnosis not present

## 2023-08-06 DIAGNOSIS — E1169 Type 2 diabetes mellitus with other specified complication: Secondary | ICD-10-CM | POA: Diagnosis not present

## 2023-08-06 DIAGNOSIS — E11319 Type 2 diabetes mellitus with unspecified diabetic retinopathy without macular edema: Secondary | ICD-10-CM | POA: Diagnosis not present

## 2023-08-06 DIAGNOSIS — M4640 Discitis, unspecified, site unspecified: Secondary | ICD-10-CM | POA: Insufficient documentation

## 2023-08-06 DIAGNOSIS — M4622 Osteomyelitis of vertebra, cervical region: Secondary | ICD-10-CM | POA: Diagnosis not present

## 2023-08-06 DIAGNOSIS — M5412 Radiculopathy, cervical region: Secondary | ICD-10-CM

## 2023-08-06 DIAGNOSIS — E1122 Type 2 diabetes mellitus with diabetic chronic kidney disease: Secondary | ICD-10-CM | POA: Diagnosis not present

## 2023-08-06 DIAGNOSIS — T847XXD Infection and inflammatory reaction due to other internal orthopedic prosthetic devices, implants and grafts, subsequent encounter: Secondary | ICD-10-CM

## 2023-08-06 DIAGNOSIS — Z9181 History of falling: Secondary | ICD-10-CM | POA: Diagnosis not present

## 2023-08-06 DIAGNOSIS — H548 Legal blindness, as defined in USA: Secondary | ICD-10-CM | POA: Diagnosis not present

## 2023-08-06 MED ORDER — SULFAMETHOXAZOLE-TRIMETHOPRIM 400-80 MG PO TABS: 1.0000 | ORAL_TABLET | Freq: Every day | ORAL | 3 refills | Status: DC

## 2023-08-06 MED ORDER — SULFAMETHOXAZOLE-TRIMETHOPRIM 400-80 MG PO TABS: 1.0000 | ORAL_TABLET | Freq: Every day | ORAL | 0 refills | Status: AC

## 2023-08-06 NOTE — Telephone Encounter (Signed)
Transition of Care - Initial Contact from Inpatient Facility  Date of discharge: 08/04/23 Date of contact: 08/05/23 Method: Phone Spoke to: Patient  Patient contacted to discuss transition of care from recent inpatient hospitalization. Patient was admitted to [facility] from 07/08/23 to 08/04/23 ... with discharge diagnosis of .Marland KitchenMarland KitchenCervical discitis/osteomyelitis /Cervical spine stenosis/compression: S/p fusion 1/6. Needed cir admit  for rehab   The discharge medication list was reviewed. Patient understands the changes and has no concerns.   Patient will return to his/her outpatient HD unit on: 08/07/23  we discussed edw  adjustment as he recalls never standing for edw and thinks current new lower  wt needs incr . Did incr in op center orders   No other concerns at this time.

## 2023-08-06 NOTE — Progress Notes (Signed)
Subjective:    Patient ID: Shawn [name], male    DOB: Aug 15, 1960, 63 y.o.   MRN: [id number]  HPI  63 y.o. male with with history of end-stage renal disease on hemodialysis with limited access options he was admitted with Staphylococcus epidermidis bacteremia in the context of C6-C7 discitis osteomyelitis with severe spinal canal stenosis  he never had a true catheter holiday as HD catheter could only be exchanged. He did undergo Neurosurgery with  C6 and C7 anterior cervical corpectomy microdissection with C5-T1 anterior cervical fusion using interbody cage harvested autograft and morselized allograft as well as C5-T1 anterior plate fixation and C5-T1 posterior cervical fusion by Dr. Dutch Quint. He completed IV vancomycin in inpatient rehab and was to have been over to doxycycline but does not appear to have been changed over and doxycycline actually would not of been active against his methicillin resistant Staphylococcus epidermidis.  His neck pain is improved dramatically as has his strength in his upper and lower extremities.  He is having dialysis on Monday Wednesday and Friday.  Discussed the use of AI scribe software for clinical note transcription with the patient, who gave verbal consent to proceed.  History of Present Illness   The patient, with a history of a severe Staphylococcus epidermidis infection, presents for follow-up. The infection was located in the neck and required neurosurgery for removal of part of the bone and disc. A cage was inserted during the procedure. Currently, the patient reports their neck is 'doing pretty good,' but they still feel a 'tightness' in the shoulder.  Prior to surgery, the patient experienced weakness in the legs and hands, particularly when sitting up. The hand weakness has improved, but leg weakness persists. The patient is on dialysis, with a catheter located in the chest. The catheter was not removed during the infection treatment due to  difficulty in finding another suitable location for it.  The patient also has a history of kidney disease and receives dialysis on Monday, Wednesday, and Friday. They have received a flu shot but have not yet received a COVID-19 booster shot.         Past Medical History:  Diagnosis Date   Anemia    Arthritis    patient denies   Blind right eye    Complication of anesthesia    Diabetes mellitus    type II   Diskitis 08/06/2023   ESRD on hemodialysis [facility])    Dialysis since 2011 MWF   HCAP (healthcare-associated pneumonia) 12/19/2022   Hypertension    Legally blind    No pertinent past medical history    PONV (postoperative nausea and vomiting)    N/V- became dehydrated had to have IV fluids- 01/2015   Shortness of breath dyspnea    when has too fluid   Stroke (HCC) 07/09/2010   date per patient   Syncope    Unspecified cerebral artery occlusion with cerebral infarction 04/02/2013    Past Surgical History:  Procedure Laterality Date   ANTERIOR CERVICAL CORPECTOMY N/A 07/15/2023   Procedure: Cervical six-Cervical seven CORPECTOMY;  Surgeon: Julio Sicks, MD;  Location: [facility] OR;  Service: Neurosurgery;  Laterality: N/A;   AV FISTULA PLACEMENT Right 05/12/2013   Procedure: INSERTION OF ARTERIOVENOUS (AV) GORE-TEX GRAFT ARM; ULTRASOUND GUIDED;  Surgeon: Fransisco Hertz, MD;  Location: [facility] OR;  Service: Vascular;  Laterality: Right;   AV FISTULA PLACEMENT Left 01/18/2015   Procedure: INSERTION OF ARTERIOVENOUS GORE-TEX GRAFT LEFT UPPER ARM;  Surgeon: Fransisco Hertz, MD;  Location: [facility] OR;  Service: Vascular;  Laterality: Left;   COLONOSCOPY  06/2012   EYE SURGERY Bilateral    retina surgery both eyes, cataract surgery both eyes   HERNIA REPAIR     right inguinal   INSERTION OF DIALYSIS CATHETER Right    IR FLUORO GUIDE CV LINE RIGHT  07/12/2023   IR PTA VENOUS EXCEPT DIALYSIS CIRCUIT  07/12/2023   left arm graft  10/2010   LIGATION ARTERIOVENOUS GORTEX GRAFT Left 05/03/2015   Procedure:  LIGATION ARTERIOVENOUS GORTEX GRAFT-LEFT UPPER ARM;  Surgeon: Fransisco Hertz, MD;  Location: [facility] OR;  Service: Vascular;  Laterality: Left;   POSTERIOR CERVICAL FUSION/FORAMINOTOMY N/A 07/15/2023   Procedure: POSTERIOR CERVICAL FUSION /Cervical Five -Thoracic one;  Surgeon: Julio Sicks, MD;  Location: [facility] OR;  Service: Neurosurgery;  Laterality: N/A;    Family History  Problem Relation Age of Onset   Diabetes Mother    Alzheimer's disease Mother    Cancer Father    Heart disease Father    Cancer Sister    Stroke Sister       Social History   Socioeconomic History   Marital status: Married    Spouse name: Not on file   Number of children: 0   Years of education: 12th   Highest education level: Not on file  Occupational History    Employer: UNEMPLOYED  Tobacco Use   Smoking status: Never   Smokeless tobacco: Never  Vaping Use   Vaping status: Never Used  Substance and Sexual Activity   Alcohol use: No    Alcohol/week: 0.0 standard drinks of alcohol   Drug use: No   Sexual activity: Not on file  Other Topics Concern   Not on file  Social History Narrative   Patient lives at home with wife.    Patient is disabled.    Patient has no children.    Patient has a 12 grade education.       Social Drivers of Corporate investment banker Strain: Not on file  Food Insecurity: No Food Insecurity (07/09/2023)   Hunger Vital Sign    Worried About Running Out of Food in the Last Year: Never true    Ran Out of Food in the Last Year: Never true  Transportation Needs: No Transportation Needs (07/09/2023)   PRAPARE - Administrator, Civil Service (Medical): No    Lack of Transportation (Non-Medical): No  Physical Activity: Not on file  Stress: Not on file  Social Connections: Not on file    No Known Allergies   Current Outpatient Medications:    acetaminophen (TYLENOL) 325 MG tablet, Take 1-2 tablets (325-650 mg total) by mouth every 6 (six) hours as needed., Disp: ,  Rfl:    amLODipine (NORVASC) 5 MG tablet, Take 1 tablet (5 mg total) by mouth daily., Disp: 30 tablet, Rfl: 0   aspirin EC 325 MG tablet, Take 325 mg by mouth at bedtime., Disp: , Rfl:    atorvastatin (LIPITOR) 80 MG tablet, Take 1 tablet (80 mg total) by mouth daily., Disp: 90 tablet, Rfl: 11   bisacodyl (DULCOLAX) 10 MG suppository, Place 1 suppository (10 mg total) rectally daily as needed for moderate constipation., Disp: 4 suppository, Rfl: 1   brimonidine (ALPHAGAN) 0.2 % ophthalmic solution, Place 1 drop into both eyes 2 (two) times daily., Disp: , Rfl:    calcitRIOL (ROCALTROL) 0.5 MCG capsule, Take 1 capsule (0.5 mcg total) by mouth 3 (three) times a week. (  Patient taking differently: Take 0.5 mcg by mouth every Monday, Wednesday, and Friday.), Disp: 30 capsule, Rfl: 0   carvedilol (COREG) 3.125 MG tablet, Take 1 tablet (3.125 mg total) by mouth 2 (two) times daily with a meal., Disp: 60 tablet, Rfl: 0   Darbepoetin Alfa (ARANESP) 100 MCG/0.5ML SOSY injection, Inject 0.5 mLs (100 mcg total) into the skin every Friday at 6 PM., Disp: 4.2 mL, Rfl: 0   dorzolamide-timolol (COSOPT) 22.3-6.8 MG/ML ophthalmic solution, Place 1 drop into both eyes 2 (two) times daily., Disp: , Rfl:    lanthanum (FOSRENOL) 1000 MG chewable tablet, Chew 0.5 tablets (500 mg total) by mouth 3 (three) times daily with meals., Disp: , Rfl:    latanoprost (XALATAN) 0.005 % ophthalmic solution, Place 1 drop into both eyes at bedtime. , Disp: , Rfl:    lisinopril (ZESTRIL) 20 MG tablet, Take 20 mg by mouth 2 (two) times daily., Disp: , Rfl:    melatonin 5 MG TABS, Take 1 tablet (5 mg total) by mouth at bedtime., Disp: 30 tablet, Rfl: 0   polyethylene glycol (MIRALAX / GLYCOLAX) 17 g packet, Take 17 g by mouth daily as needed for mild constipation., Disp: , Rfl:    senna-docusate (SENOKOT-S) 8.6-50 MG tablet, Take 1 tablet by mouth 2 (two) times daily., Disp: 60 tablet, Rfl: 0   vancomycin IVPB, Inject 750 mg into the vein  every Monday, Wednesday, and Friday with hemodialysis. Indication:  MRSE discitis First Dose: No Last Day of Therapy:  08/26/23 Labs - Sunday/Monday:  CBC/D, BMP, and vancomycin trough. Labs - Thursday:  BMP and vancomycin trough Labs - Once weekly: ESR and CRP Method of administration:Elastomeric Method of administration may be changed at the discretion of the patient and/or caregiver's ability to self-administer the medication ordered., Disp: , Rfl:    Review of Systems  Constitutional:  Negative for activity change, appetite change, chills, diaphoresis, fatigue, fever and unexpected weight change.  HENT:  Negative for congestion, rhinorrhea, sinus pressure, sneezing, sore throat and trouble swallowing.   Eyes:  Negative for photophobia and visual disturbance.  Respiratory:  Negative for cough, chest tightness, shortness of breath, wheezing and stridor.   Cardiovascular:  Negative for chest pain, palpitations and leg swelling.  Gastrointestinal:  Negative for abdominal distention, abdominal pain, anal bleeding, blood in stool, constipation, diarrhea, nausea and vomiting.  Genitourinary:  Negative for difficulty urinating, dysuria, flank pain and hematuria.  Musculoskeletal:  Negative for arthralgias, back pain, gait problem, joint swelling and myalgias.  Skin:  Negative for color change, pallor, rash and wound.  Neurological:  Negative for dizziness, tremors, weakness and light-headedness.  Hematological:  Negative for adenopathy. Does not bruise/bleed easily.  Psychiatric/Behavioral:  Negative for agitation, behavioral problems, confusion, decreased concentration, dysphoric mood and sleep disturbance.        Objective:   Physical Exam Constitutional:      Appearance: He is well-developed.  HENT:     Head: Normocephalic and atraumatic.  Eyes:     Conjunctiva/sclera: Conjunctivae normal.  Cardiovascular:     Rate and Rhythm: Normal rate and regular rhythm.  Pulmonary:     Effort:  Pulmonary effort is normal. No respiratory distress.     Breath sounds: No wheezing.  Abdominal:     General: There is no distension.     Palpations: Abdomen is soft.  Musculoskeletal:        General: No tenderness. Normal range of motion.     Cervical back: Normal range of motion and  neck supple.  Skin:    General: Skin is warm and dry.     Coloration: Skin is not pale.     Findings: No erythema or rash.  Neurological:     General: No focal deficit present.     Mental Status: He is alert and oriented to person, place, and time.  Psychiatric:        Mood and Affect: Mood normal.        Behavior: Behavior normal.        Thought Content: Thought content normal.        Judgment: Judgment normal.           Assessment & Plan:  Assessment and Plan    Staphylococcus epidermidis infection History of severe infection in the neck requiring neurosurgery. Currently on dialysis with a catheter in the chest. Given the presence of hardware in the neck and the risk of recurrent infection, long-term antibiotics are recommended. -Send antibiotics (Bactrim SS) to [facility] for a 89-month supply, then to [facility] for a 90-day supply. -Continue antibiotics for at least 1 year, possibly indefinitely.  Discitis, vertebral osteomyelitis now sp NS with hardware Reports improvement in weakness, but still experiencing some tightness in the shoulder. -Continue to monitor and manage symptoms. --check labs next visit --continue Bactrim SS daily after HD on HD days for minimum of a year but potentially indefinitely  COVID-19 Vaccination Has not received a COVID-19 vaccine booster shot this fall. -Administer COVID-19 vaccine booster shot today.  Follow-up Schedule a follow-up appointment in 2 months on a Tuesday or Thursday, as these days work best with the patient's dialysis schedule.

## 2023-08-07 DIAGNOSIS — M8610 Other acute osteomyelitis, unspecified site: Secondary | ICD-10-CM | POA: Diagnosis not present

## 2023-08-07 DIAGNOSIS — D631 Anemia in chronic kidney disease: Secondary | ICD-10-CM | POA: Diagnosis not present

## 2023-08-07 DIAGNOSIS — E876 Hypokalemia: Secondary | ICD-10-CM | POA: Diagnosis not present

## 2023-08-07 DIAGNOSIS — Z992 Dependence on renal dialysis: Secondary | ICD-10-CM | POA: Diagnosis not present

## 2023-08-07 DIAGNOSIS — N2581 Secondary hyperparathyroidism of renal origin: Secondary | ICD-10-CM | POA: Diagnosis not present

## 2023-08-07 DIAGNOSIS — N186 End stage renal disease: Secondary | ICD-10-CM | POA: Diagnosis not present

## 2023-08-08 ENCOUNTER — Other Ambulatory Visit (HOSPITAL_COMMUNITY): Payer: Self-pay

## 2023-08-09 ENCOUNTER — Ambulatory Visit (HOSPITAL_BASED_OUTPATIENT_CLINIC_OR_DEPARTMENT_OTHER): Payer: Medicare Other | Admitting: Cardiology

## 2023-08-09 DIAGNOSIS — D631 Anemia in chronic kidney disease: Secondary | ICD-10-CM | POA: Diagnosis not present

## 2023-08-09 DIAGNOSIS — N2581 Secondary hyperparathyroidism of renal origin: Secondary | ICD-10-CM | POA: Diagnosis not present

## 2023-08-09 DIAGNOSIS — E876 Hypokalemia: Secondary | ICD-10-CM | POA: Diagnosis not present

## 2023-08-09 DIAGNOSIS — N186 End stage renal disease: Secondary | ICD-10-CM | POA: Diagnosis not present

## 2023-08-09 DIAGNOSIS — M8610 Other acute osteomyelitis, unspecified site: Secondary | ICD-10-CM | POA: Diagnosis not present

## 2023-08-09 DIAGNOSIS — Z992 Dependence on renal dialysis: Secondary | ICD-10-CM | POA: Diagnosis not present

## 2023-08-10 DIAGNOSIS — Z992 Dependence on renal dialysis: Secondary | ICD-10-CM | POA: Diagnosis not present

## 2023-08-10 DIAGNOSIS — N186 End stage renal disease: Secondary | ICD-10-CM | POA: Diagnosis not present

## 2023-08-10 DIAGNOSIS — I120 Hypertensive chronic kidney disease with stage 5 chronic kidney disease or end stage renal disease: Secondary | ICD-10-CM | POA: Diagnosis not present

## 2023-08-12 ENCOUNTER — Encounter: Payer: Medicare Other | Admitting: Physical Medicine and Rehabilitation

## 2023-08-12 DIAGNOSIS — M8610 Other acute osteomyelitis, unspecified site: Secondary | ICD-10-CM | POA: Diagnosis not present

## 2023-08-12 DIAGNOSIS — I6930 Unspecified sequelae of cerebral infarction: Secondary | ICD-10-CM | POA: Diagnosis not present

## 2023-08-12 DIAGNOSIS — Z79899 Other long term (current) drug therapy: Secondary | ICD-10-CM | POA: Diagnosis not present

## 2023-08-12 DIAGNOSIS — E1165 Type 2 diabetes mellitus with hyperglycemia: Secondary | ICD-10-CM | POA: Diagnosis not present

## 2023-08-12 DIAGNOSIS — M4622 Osteomyelitis of vertebra, cervical region: Secondary | ICD-10-CM | POA: Diagnosis not present

## 2023-08-12 DIAGNOSIS — H548 Legal blindness, as defined in USA: Secondary | ICD-10-CM | POA: Diagnosis not present

## 2023-08-12 DIAGNOSIS — D631 Anemia in chronic kidney disease: Secondary | ICD-10-CM | POA: Diagnosis not present

## 2023-08-12 DIAGNOSIS — Z992 Dependence on renal dialysis: Secondary | ICD-10-CM | POA: Diagnosis not present

## 2023-08-12 DIAGNOSIS — R55 Syncope and collapse: Secondary | ICD-10-CM | POA: Diagnosis not present

## 2023-08-12 DIAGNOSIS — E559 Vitamin D deficiency, unspecified: Secondary | ICD-10-CM | POA: Diagnosis not present

## 2023-08-12 DIAGNOSIS — N2581 Secondary hyperparathyroidism of renal origin: Secondary | ICD-10-CM | POA: Diagnosis not present

## 2023-08-12 DIAGNOSIS — E876 Hypokalemia: Secondary | ICD-10-CM | POA: Diagnosis not present

## 2023-08-12 DIAGNOSIS — M1220 Villonodular synovitis (pigmented), unspecified site: Secondary | ICD-10-CM | POA: Diagnosis not present

## 2023-08-12 DIAGNOSIS — I119 Hypertensive heart disease without heart failure: Secondary | ICD-10-CM | POA: Diagnosis not present

## 2023-08-12 DIAGNOSIS — E7800 Pure hypercholesterolemia, unspecified: Secondary | ICD-10-CM | POA: Diagnosis not present

## 2023-08-12 DIAGNOSIS — N186 End stage renal disease: Secondary | ICD-10-CM | POA: Diagnosis not present

## 2023-08-12 DIAGNOSIS — I6990 Unspecified sequelae of unspecified cerebrovascular disease: Secondary | ICD-10-CM | POA: Diagnosis not present

## 2023-08-12 DIAGNOSIS — M12512 Traumatic arthropathy, left shoulder: Secondary | ICD-10-CM | POA: Diagnosis not present

## 2023-08-13 ENCOUNTER — Ambulatory Visit (HOSPITAL_BASED_OUTPATIENT_CLINIC_OR_DEPARTMENT_OTHER): Payer: Medicare Other | Admitting: Family

## 2023-08-14 DIAGNOSIS — N186 End stage renal disease: Secondary | ICD-10-CM | POA: Diagnosis not present

## 2023-08-14 DIAGNOSIS — M4642 Discitis, unspecified, cervical region: Secondary | ICD-10-CM | POA: Diagnosis not present

## 2023-08-14 DIAGNOSIS — I120 Hypertensive chronic kidney disease with stage 5 chronic kidney disease or end stage renal disease: Secondary | ICD-10-CM | POA: Diagnosis not present

## 2023-08-14 DIAGNOSIS — D631 Anemia in chronic kidney disease: Secondary | ICD-10-CM | POA: Diagnosis not present

## 2023-08-14 DIAGNOSIS — M4622 Osteomyelitis of vertebra, cervical region: Secondary | ICD-10-CM | POA: Diagnosis not present

## 2023-08-14 DIAGNOSIS — N2581 Secondary hyperparathyroidism of renal origin: Secondary | ICD-10-CM | POA: Diagnosis not present

## 2023-08-14 DIAGNOSIS — Z992 Dependence on renal dialysis: Secondary | ICD-10-CM | POA: Diagnosis not present

## 2023-08-14 DIAGNOSIS — E1169 Type 2 diabetes mellitus with other specified complication: Secondary | ICD-10-CM | POA: Diagnosis not present

## 2023-08-14 DIAGNOSIS — E1122 Type 2 diabetes mellitus with diabetic chronic kidney disease: Secondary | ICD-10-CM | POA: Diagnosis not present

## 2023-08-14 DIAGNOSIS — M8610 Other acute osteomyelitis, unspecified site: Secondary | ICD-10-CM | POA: Diagnosis not present

## 2023-08-14 DIAGNOSIS — E876 Hypokalemia: Secondary | ICD-10-CM | POA: Diagnosis not present

## 2023-08-15 DIAGNOSIS — I120 Hypertensive chronic kidney disease with stage 5 chronic kidney disease or end stage renal disease: Secondary | ICD-10-CM | POA: Diagnosis not present

## 2023-08-15 DIAGNOSIS — E1122 Type 2 diabetes mellitus with diabetic chronic kidney disease: Secondary | ICD-10-CM | POA: Diagnosis not present

## 2023-08-15 DIAGNOSIS — M4642 Discitis, unspecified, cervical region: Secondary | ICD-10-CM | POA: Diagnosis not present

## 2023-08-15 DIAGNOSIS — N186 End stage renal disease: Secondary | ICD-10-CM | POA: Diagnosis not present

## 2023-08-15 DIAGNOSIS — E1169 Type 2 diabetes mellitus with other specified complication: Secondary | ICD-10-CM | POA: Diagnosis not present

## 2023-08-15 DIAGNOSIS — M4622 Osteomyelitis of vertebra, cervical region: Secondary | ICD-10-CM | POA: Diagnosis not present

## 2023-08-16 DIAGNOSIS — N186 End stage renal disease: Secondary | ICD-10-CM | POA: Diagnosis not present

## 2023-08-16 DIAGNOSIS — Z992 Dependence on renal dialysis: Secondary | ICD-10-CM | POA: Diagnosis not present

## 2023-08-16 DIAGNOSIS — E876 Hypokalemia: Secondary | ICD-10-CM | POA: Diagnosis not present

## 2023-08-16 DIAGNOSIS — N2581 Secondary hyperparathyroidism of renal origin: Secondary | ICD-10-CM | POA: Diagnosis not present

## 2023-08-16 DIAGNOSIS — M8610 Other acute osteomyelitis, unspecified site: Secondary | ICD-10-CM | POA: Diagnosis not present

## 2023-08-16 DIAGNOSIS — D631 Anemia in chronic kidney disease: Secondary | ICD-10-CM | POA: Diagnosis not present

## 2023-08-19 ENCOUNTER — Encounter: Payer: Medicare Other | Admitting: Physical Medicine and Rehabilitation

## 2023-08-19 DIAGNOSIS — N186 End stage renal disease: Secondary | ICD-10-CM | POA: Diagnosis not present

## 2023-08-19 DIAGNOSIS — M8610 Other acute osteomyelitis, unspecified site: Secondary | ICD-10-CM | POA: Diagnosis not present

## 2023-08-19 DIAGNOSIS — N2581 Secondary hyperparathyroidism of renal origin: Secondary | ICD-10-CM | POA: Diagnosis not present

## 2023-08-19 DIAGNOSIS — D631 Anemia in chronic kidney disease: Secondary | ICD-10-CM | POA: Diagnosis not present

## 2023-08-19 DIAGNOSIS — Z992 Dependence on renal dialysis: Secondary | ICD-10-CM | POA: Diagnosis not present

## 2023-08-19 DIAGNOSIS — E876 Hypokalemia: Secondary | ICD-10-CM | POA: Diagnosis not present

## 2023-08-20 ENCOUNTER — Encounter: Payer: Self-pay | Admitting: Podiatry

## 2023-08-20 ENCOUNTER — Ambulatory Visit (INDEPENDENT_AMBULATORY_CARE_PROVIDER_SITE_OTHER): Payer: Medicare Other | Admitting: Podiatry

## 2023-08-20 DIAGNOSIS — M79674 Pain in right toe(s): Secondary | ICD-10-CM | POA: Diagnosis not present

## 2023-08-20 DIAGNOSIS — M79675 Pain in left toe(s): Secondary | ICD-10-CM

## 2023-08-20 DIAGNOSIS — Z992 Dependence on renal dialysis: Secondary | ICD-10-CM

## 2023-08-20 DIAGNOSIS — N186 End stage renal disease: Secondary | ICD-10-CM

## 2023-08-20 DIAGNOSIS — B351 Tinea unguium: Secondary | ICD-10-CM

## 2023-08-20 DIAGNOSIS — E1122 Type 2 diabetes mellitus with diabetic chronic kidney disease: Secondary | ICD-10-CM | POA: Diagnosis not present

## 2023-08-20 DIAGNOSIS — E1151 Type 2 diabetes mellitus with diabetic peripheral angiopathy without gangrene: Secondary | ICD-10-CM

## 2023-08-20 DIAGNOSIS — I120 Hypertensive chronic kidney disease with stage 5 chronic kidney disease or end stage renal disease: Secondary | ICD-10-CM | POA: Diagnosis not present

## 2023-08-20 DIAGNOSIS — M4622 Osteomyelitis of vertebra, cervical region: Secondary | ICD-10-CM | POA: Diagnosis not present

## 2023-08-20 DIAGNOSIS — M4642 Discitis, unspecified, cervical region: Secondary | ICD-10-CM | POA: Diagnosis not present

## 2023-08-20 DIAGNOSIS — E1169 Type 2 diabetes mellitus with other specified complication: Secondary | ICD-10-CM | POA: Diagnosis not present

## 2023-08-21 DIAGNOSIS — E1122 Type 2 diabetes mellitus with diabetic chronic kidney disease: Secondary | ICD-10-CM | POA: Diagnosis not present

## 2023-08-21 DIAGNOSIS — I120 Hypertensive chronic kidney disease with stage 5 chronic kidney disease or end stage renal disease: Secondary | ICD-10-CM | POA: Diagnosis not present

## 2023-08-21 DIAGNOSIS — D631 Anemia in chronic kidney disease: Secondary | ICD-10-CM | POA: Diagnosis not present

## 2023-08-21 DIAGNOSIS — M4642 Discitis, unspecified, cervical region: Secondary | ICD-10-CM | POA: Diagnosis not present

## 2023-08-21 DIAGNOSIS — Z992 Dependence on renal dialysis: Secondary | ICD-10-CM | POA: Diagnosis not present

## 2023-08-21 DIAGNOSIS — N186 End stage renal disease: Secondary | ICD-10-CM | POA: Diagnosis not present

## 2023-08-21 DIAGNOSIS — M8610 Other acute osteomyelitis, unspecified site: Secondary | ICD-10-CM | POA: Diagnosis not present

## 2023-08-21 DIAGNOSIS — E876 Hypokalemia: Secondary | ICD-10-CM | POA: Diagnosis not present

## 2023-08-21 DIAGNOSIS — E1169 Type 2 diabetes mellitus with other specified complication: Secondary | ICD-10-CM | POA: Diagnosis not present

## 2023-08-21 DIAGNOSIS — M4622 Osteomyelitis of vertebra, cervical region: Secondary | ICD-10-CM | POA: Diagnosis not present

## 2023-08-21 DIAGNOSIS — N2581 Secondary hyperparathyroidism of renal origin: Secondary | ICD-10-CM | POA: Diagnosis not present

## 2023-08-22 DIAGNOSIS — M4642 Discitis, unspecified, cervical region: Secondary | ICD-10-CM | POA: Diagnosis not present

## 2023-08-22 DIAGNOSIS — I120 Hypertensive chronic kidney disease with stage 5 chronic kidney disease or end stage renal disease: Secondary | ICD-10-CM | POA: Diagnosis not present

## 2023-08-22 DIAGNOSIS — N186 End stage renal disease: Secondary | ICD-10-CM | POA: Diagnosis not present

## 2023-08-22 DIAGNOSIS — E1122 Type 2 diabetes mellitus with diabetic chronic kidney disease: Secondary | ICD-10-CM | POA: Diagnosis not present

## 2023-08-22 DIAGNOSIS — E1169 Type 2 diabetes mellitus with other specified complication: Secondary | ICD-10-CM | POA: Diagnosis not present

## 2023-08-22 DIAGNOSIS — M4622 Osteomyelitis of vertebra, cervical region: Secondary | ICD-10-CM | POA: Diagnosis not present

## 2023-08-23 DIAGNOSIS — N2581 Secondary hyperparathyroidism of renal origin: Secondary | ICD-10-CM | POA: Diagnosis not present

## 2023-08-23 DIAGNOSIS — D631 Anemia in chronic kidney disease: Secondary | ICD-10-CM | POA: Diagnosis not present

## 2023-08-23 DIAGNOSIS — M8610 Other acute osteomyelitis, unspecified site: Secondary | ICD-10-CM | POA: Diagnosis not present

## 2023-08-23 DIAGNOSIS — E876 Hypokalemia: Secondary | ICD-10-CM | POA: Diagnosis not present

## 2023-08-23 DIAGNOSIS — N186 End stage renal disease: Secondary | ICD-10-CM | POA: Diagnosis not present

## 2023-08-23 DIAGNOSIS — Z992 Dependence on renal dialysis: Secondary | ICD-10-CM | POA: Diagnosis not present

## 2023-08-26 DIAGNOSIS — Z992 Dependence on renal dialysis: Secondary | ICD-10-CM | POA: Diagnosis not present

## 2023-08-26 DIAGNOSIS — D631 Anemia in chronic kidney disease: Secondary | ICD-10-CM | POA: Diagnosis not present

## 2023-08-26 DIAGNOSIS — E876 Hypokalemia: Secondary | ICD-10-CM | POA: Diagnosis not present

## 2023-08-26 DIAGNOSIS — M8610 Other acute osteomyelitis, unspecified site: Secondary | ICD-10-CM | POA: Diagnosis not present

## 2023-08-26 DIAGNOSIS — N2581 Secondary hyperparathyroidism of renal origin: Secondary | ICD-10-CM | POA: Diagnosis not present

## 2023-08-26 DIAGNOSIS — N186 End stage renal disease: Secondary | ICD-10-CM | POA: Diagnosis not present

## 2023-08-26 NOTE — Progress Notes (Signed)
  Subjective:  Patient ID: Shawn [name], male    DOB: March 02, 1961,  MRN: [id number]  Shawn [name] presents to clinic today for at risk foot care. Pt has h/o NIDDM with PAD and painful mycotic toenails x 10 which interfere with daily activities. Pain is relieved with periodic professional debridement.  Chief Complaint  Patient presents with   rfc    Nail trim  Pcp kilpatrick   New problem(s): None.   PCP is Corine Shelter, MD.  No Known Allergies  Review of Systems: Negative except as noted in the HPI.  Objective: No changes noted in today's physical examination. There were no vitals filed for this visit. Shawn [name] is a pleasant 63 y.o. male thin build in NAD. AAO x 3.  Vascular Examination: CFT <4 seconds b/l. DP pulses diminished b/l. PT pulses diminished b/l. Digital hair absent. Skin temperature gradient warm to cool b/l. No ischemia or gangrene. No cyanosis or clubbing noted b/l.    Neurological Examination: Protective sensation diminished with 10g monofilament b/l.  Dermatological Examination: Pedal skin thin, shiny and atrophic b/l. No open wounds. No interdigital macerations.   Toenails 1-5 b/l thick, discolored, elongated with subungual debris and pain on dorsal palpation.   No corns, calluses nor porokeratotic lesions noted.  Musculoskeletal Examination: Muscle strength 5/5 to all lower extremity muscle groups bilaterally. Hammertoe(s) noted to the bilateral 2nd toes.  Radiographs: None  Last A1c:      Latest Ref Rng & Units 07/09/2023    3:23 PM 10/07/2022    7:30 PM  Hemoglobin A1C  Hemoglobin-A1c 4.8 - 5.6 % 5.8  6.6    Assessment/Plan: 1. Pain due to onychomycosis of toenails of both feet   2. ESRD on dialysis (HCC)   3. Type II diabetes mellitus with peripheral circulatory disorder [facility])     Patient was evaluated and treated. All patient's and/or POA's questions/concerns addressed on today's visit. Mycotic toenails 1-5  debrided in length and girth without incident.  Continue daily foot inspections and monitor blood glucose per PCP/Endocrinologist's recommendations.Continue soft, supportive shoe gear daily. Report any pedal injuries to medical professional. Call office if there are any quesitons/concerns. -Patient/POA to call should there be question/concern in the interim.   Return in about 9 weeks (around 10/22/2023).  Freddie Breech, DPM      [place] LOCATION: [address] [place] [address]                   Office [phone]   [facility] LOCATION: [address] [place], [place] [address] Office [phone]

## 2023-08-28 DIAGNOSIS — E1129 Type 2 diabetes mellitus with other diabetic kidney complication: Secondary | ICD-10-CM | POA: Diagnosis not present

## 2023-08-28 DIAGNOSIS — D631 Anemia in chronic kidney disease: Secondary | ICD-10-CM | POA: Diagnosis not present

## 2023-08-28 DIAGNOSIS — E876 Hypokalemia: Secondary | ICD-10-CM | POA: Diagnosis not present

## 2023-08-28 DIAGNOSIS — N186 End stage renal disease: Secondary | ICD-10-CM | POA: Diagnosis not present

## 2023-08-28 DIAGNOSIS — Z992 Dependence on renal dialysis: Secondary | ICD-10-CM | POA: Diagnosis not present

## 2023-08-28 DIAGNOSIS — N2581 Secondary hyperparathyroidism of renal origin: Secondary | ICD-10-CM | POA: Diagnosis not present

## 2023-08-28 DIAGNOSIS — M8610 Other acute osteomyelitis, unspecified site: Secondary | ICD-10-CM | POA: Diagnosis not present

## 2023-08-29 DIAGNOSIS — M4622 Osteomyelitis of vertebra, cervical region: Secondary | ICD-10-CM | POA: Diagnosis not present

## 2023-08-29 DIAGNOSIS — N186 End stage renal disease: Secondary | ICD-10-CM | POA: Diagnosis not present

## 2023-08-29 DIAGNOSIS — M4642 Discitis, unspecified, cervical region: Secondary | ICD-10-CM | POA: Diagnosis not present

## 2023-08-29 DIAGNOSIS — E1169 Type 2 diabetes mellitus with other specified complication: Secondary | ICD-10-CM | POA: Diagnosis not present

## 2023-08-29 DIAGNOSIS — E1122 Type 2 diabetes mellitus with diabetic chronic kidney disease: Secondary | ICD-10-CM | POA: Diagnosis not present

## 2023-08-29 DIAGNOSIS — I120 Hypertensive chronic kidney disease with stage 5 chronic kidney disease or end stage renal disease: Secondary | ICD-10-CM | POA: Diagnosis not present

## 2023-08-30 DIAGNOSIS — N186 End stage renal disease: Secondary | ICD-10-CM | POA: Diagnosis not present

## 2023-08-30 DIAGNOSIS — E876 Hypokalemia: Secondary | ICD-10-CM | POA: Diagnosis not present

## 2023-08-30 DIAGNOSIS — Z992 Dependence on renal dialysis: Secondary | ICD-10-CM | POA: Diagnosis not present

## 2023-08-30 DIAGNOSIS — D631 Anemia in chronic kidney disease: Secondary | ICD-10-CM | POA: Diagnosis not present

## 2023-08-30 DIAGNOSIS — N2581 Secondary hyperparathyroidism of renal origin: Secondary | ICD-10-CM | POA: Diagnosis not present

## 2023-08-30 DIAGNOSIS — M8610 Other acute osteomyelitis, unspecified site: Secondary | ICD-10-CM | POA: Diagnosis not present

## 2023-09-02 DIAGNOSIS — Z992 Dependence on renal dialysis: Secondary | ICD-10-CM | POA: Diagnosis not present

## 2023-09-02 DIAGNOSIS — M8610 Other acute osteomyelitis, unspecified site: Secondary | ICD-10-CM | POA: Diagnosis not present

## 2023-09-02 DIAGNOSIS — E876 Hypokalemia: Secondary | ICD-10-CM | POA: Diagnosis not present

## 2023-09-02 DIAGNOSIS — N186 End stage renal disease: Secondary | ICD-10-CM | POA: Diagnosis not present

## 2023-09-02 DIAGNOSIS — N2581 Secondary hyperparathyroidism of renal origin: Secondary | ICD-10-CM | POA: Diagnosis not present

## 2023-09-02 DIAGNOSIS — D631 Anemia in chronic kidney disease: Secondary | ICD-10-CM | POA: Diagnosis not present

## 2023-09-03 DIAGNOSIS — M4622 Osteomyelitis of vertebra, cervical region: Secondary | ICD-10-CM | POA: Diagnosis not present

## 2023-09-03 DIAGNOSIS — N186 End stage renal disease: Secondary | ICD-10-CM | POA: Diagnosis not present

## 2023-09-03 DIAGNOSIS — E1122 Type 2 diabetes mellitus with diabetic chronic kidney disease: Secondary | ICD-10-CM | POA: Diagnosis not present

## 2023-09-03 DIAGNOSIS — E1169 Type 2 diabetes mellitus with other specified complication: Secondary | ICD-10-CM | POA: Diagnosis not present

## 2023-09-03 DIAGNOSIS — M4642 Discitis, unspecified, cervical region: Secondary | ICD-10-CM | POA: Diagnosis not present

## 2023-09-03 DIAGNOSIS — I120 Hypertensive chronic kidney disease with stage 5 chronic kidney disease or end stage renal disease: Secondary | ICD-10-CM | POA: Diagnosis not present

## 2023-09-04 DIAGNOSIS — E876 Hypokalemia: Secondary | ICD-10-CM | POA: Diagnosis not present

## 2023-09-04 DIAGNOSIS — E1122 Type 2 diabetes mellitus with diabetic chronic kidney disease: Secondary | ICD-10-CM | POA: Diagnosis not present

## 2023-09-04 DIAGNOSIS — Z992 Dependence on renal dialysis: Secondary | ICD-10-CM | POA: Diagnosis not present

## 2023-09-04 DIAGNOSIS — D631 Anemia in chronic kidney disease: Secondary | ICD-10-CM | POA: Diagnosis not present

## 2023-09-04 DIAGNOSIS — N2581 Secondary hyperparathyroidism of renal origin: Secondary | ICD-10-CM | POA: Diagnosis not present

## 2023-09-04 DIAGNOSIS — E1169 Type 2 diabetes mellitus with other specified complication: Secondary | ICD-10-CM | POA: Diagnosis not present

## 2023-09-04 DIAGNOSIS — M4642 Discitis, unspecified, cervical region: Secondary | ICD-10-CM | POA: Diagnosis not present

## 2023-09-04 DIAGNOSIS — N186 End stage renal disease: Secondary | ICD-10-CM | POA: Diagnosis not present

## 2023-09-04 DIAGNOSIS — M4622 Osteomyelitis of vertebra, cervical region: Secondary | ICD-10-CM | POA: Diagnosis not present

## 2023-09-04 DIAGNOSIS — M8610 Other acute osteomyelitis, unspecified site: Secondary | ICD-10-CM | POA: Diagnosis not present

## 2023-09-04 DIAGNOSIS — I120 Hypertensive chronic kidney disease with stage 5 chronic kidney disease or end stage renal disease: Secondary | ICD-10-CM | POA: Diagnosis not present

## 2023-09-05 DIAGNOSIS — Z9181 History of falling: Secondary | ICD-10-CM | POA: Diagnosis not present

## 2023-09-05 DIAGNOSIS — I120 Hypertensive chronic kidney disease with stage 5 chronic kidney disease or end stage renal disease: Secondary | ICD-10-CM | POA: Diagnosis not present

## 2023-09-05 DIAGNOSIS — M4802 Spinal stenosis, cervical region: Secondary | ICD-10-CM | POA: Diagnosis not present

## 2023-09-05 DIAGNOSIS — Z794 Long term (current) use of insulin: Secondary | ICD-10-CM | POA: Diagnosis not present

## 2023-09-05 DIAGNOSIS — M4622 Osteomyelitis of vertebra, cervical region: Secondary | ICD-10-CM | POA: Diagnosis not present

## 2023-09-05 DIAGNOSIS — N186 End stage renal disease: Secondary | ICD-10-CM | POA: Diagnosis not present

## 2023-09-05 DIAGNOSIS — E1169 Type 2 diabetes mellitus with other specified complication: Secondary | ICD-10-CM | POA: Diagnosis not present

## 2023-09-05 DIAGNOSIS — H548 Legal blindness, as defined in USA: Secondary | ICD-10-CM | POA: Diagnosis not present

## 2023-09-05 DIAGNOSIS — Z992 Dependence on renal dialysis: Secondary | ICD-10-CM | POA: Diagnosis not present

## 2023-09-05 DIAGNOSIS — E1122 Type 2 diabetes mellitus with diabetic chronic kidney disease: Secondary | ICD-10-CM | POA: Diagnosis not present

## 2023-09-05 DIAGNOSIS — M4642 Discitis, unspecified, cervical region: Secondary | ICD-10-CM | POA: Diagnosis not present

## 2023-09-05 DIAGNOSIS — K5900 Constipation, unspecified: Secondary | ICD-10-CM | POA: Diagnosis not present

## 2023-09-05 DIAGNOSIS — E11319 Type 2 diabetes mellitus with unspecified diabetic retinopathy without macular edema: Secondary | ICD-10-CM | POA: Diagnosis not present

## 2023-09-05 DIAGNOSIS — D631 Anemia in chronic kidney disease: Secondary | ICD-10-CM | POA: Diagnosis not present

## 2023-09-05 DIAGNOSIS — Z7982 Long term (current) use of aspirin: Secondary | ICD-10-CM | POA: Diagnosis not present

## 2023-09-06 DIAGNOSIS — N2581 Secondary hyperparathyroidism of renal origin: Secondary | ICD-10-CM | POA: Diagnosis not present

## 2023-09-06 DIAGNOSIS — N186 End stage renal disease: Secondary | ICD-10-CM | POA: Diagnosis not present

## 2023-09-06 DIAGNOSIS — E876 Hypokalemia: Secondary | ICD-10-CM | POA: Diagnosis not present

## 2023-09-06 DIAGNOSIS — D631 Anemia in chronic kidney disease: Secondary | ICD-10-CM | POA: Diagnosis not present

## 2023-09-06 DIAGNOSIS — M8610 Other acute osteomyelitis, unspecified site: Secondary | ICD-10-CM | POA: Diagnosis not present

## 2023-09-06 DIAGNOSIS — Z992 Dependence on renal dialysis: Secondary | ICD-10-CM | POA: Diagnosis not present

## 2023-09-07 DIAGNOSIS — Z992 Dependence on renal dialysis: Secondary | ICD-10-CM | POA: Diagnosis not present

## 2023-09-07 DIAGNOSIS — N186 End stage renal disease: Secondary | ICD-10-CM | POA: Diagnosis not present

## 2023-09-07 DIAGNOSIS — I120 Hypertensive chronic kidney disease with stage 5 chronic kidney disease or end stage renal disease: Secondary | ICD-10-CM | POA: Diagnosis not present

## 2023-09-09 DIAGNOSIS — M4642 Discitis, unspecified, cervical region: Secondary | ICD-10-CM | POA: Diagnosis not present

## 2023-09-09 DIAGNOSIS — I120 Hypertensive chronic kidney disease with stage 5 chronic kidney disease or end stage renal disease: Secondary | ICD-10-CM | POA: Diagnosis not present

## 2023-09-09 DIAGNOSIS — E1169 Type 2 diabetes mellitus with other specified complication: Secondary | ICD-10-CM | POA: Diagnosis not present

## 2023-09-09 DIAGNOSIS — E1122 Type 2 diabetes mellitus with diabetic chronic kidney disease: Secondary | ICD-10-CM | POA: Diagnosis not present

## 2023-09-09 DIAGNOSIS — N186 End stage renal disease: Secondary | ICD-10-CM | POA: Diagnosis not present

## 2023-09-09 DIAGNOSIS — E876 Hypokalemia: Secondary | ICD-10-CM | POA: Diagnosis not present

## 2023-09-09 DIAGNOSIS — D509 Iron deficiency anemia, unspecified: Secondary | ICD-10-CM | POA: Diagnosis not present

## 2023-09-09 DIAGNOSIS — D631 Anemia in chronic kidney disease: Secondary | ICD-10-CM | POA: Diagnosis not present

## 2023-09-09 DIAGNOSIS — M4622 Osteomyelitis of vertebra, cervical region: Secondary | ICD-10-CM | POA: Diagnosis not present

## 2023-09-09 DIAGNOSIS — N2581 Secondary hyperparathyroidism of renal origin: Secondary | ICD-10-CM | POA: Diagnosis not present

## 2023-09-09 DIAGNOSIS — Z992 Dependence on renal dialysis: Secondary | ICD-10-CM | POA: Diagnosis not present

## 2023-09-10 NOTE — Progress Notes (Deleted)
 Subjective:    Patient ID: Shawn [name], male    DOB: 02-16-1961, 63 y.o.   MRN: [id number]  HPI  Pain Inventory Average Pain {NUMBERS; 0-10:5044} Pain Right Now {NUMBERS; 0-10:5044} My pain is {PAIN DESCRIPTION:21022940}  LOCATION OF PAIN  ***  BOWEL Number of stools per week: *** Oral laxative use {YES/NO:21197} Type of laxative *** Enema or suppository use {YES/NO:21197} History of colostomy {YES/NO:21197} Incontinent {YES/NO:21197}  BLADDER {bladder options:24190} In and out cath, frequency *** Able to self cath {YES/NO:21197} Bladder incontinence {YES/NO:21197} Frequent urination {YES/NO:21197} Leakage with coughing {YES/NO:21197} Difficulty starting stream {YES/NO:21197} Incomplete bladder emptying {YES/NO:21197}   Mobility {MOBILITY GEX:52841324}  Function {FUNCTION:21022946}  Neuro/Psych {NEURO/PSYCH:21022948}  Prior Studies {CPRM PRIOR STUDIES:21022953}  Physicians involved in your care {CPRM PHYSICIANS INVOLVED IN YOUR CARE:21022954}   Family History  Problem Relation Age of Onset   Diabetes Mother    Alzheimer's disease Mother    Cancer Father    Heart disease Father    Cancer Sister    Stroke Sister    Social History   Socioeconomic History   Marital status: Married    Spouse name: Not on file   Number of children: 0   Years of education: 12th   Highest education level: Not on file  Occupational History    Employer: UNEMPLOYED  Tobacco Use   Smoking status: Never   Smokeless tobacco: Never  Vaping Use   Vaping status: Never Used  Substance and Sexual Activity   Alcohol use: No    Alcohol/week: 0.0 standard drinks of alcohol   Drug use: No   Sexual activity: Not on file  Other Topics Concern   Not on file  Social History Narrative   Patient lives at home with wife.    Patient is disabled.    Patient has no children.    Patient has a 12 grade education.       Social Drivers of Corporate investment banker  Strain: Not on file  Food Insecurity: No Food Insecurity (07/09/2023)   Hunger Vital Sign    Worried About Running Out of Food in the Last Year: Never true    Ran Out of Food in the Last Year: Never true  Transportation Needs: No Transportation Needs (07/09/2023)   PRAPARE - Administrator, Civil Service (Medical): No    Lack of Transportation (Non-Medical): No  Physical Activity: Not on file  Stress: Not on file  Social Connections: Not on file   Past Surgical History:  Procedure Laterality Date   ANTERIOR CERVICAL CORPECTOMY N/A 07/15/2023   Procedure: Cervical six-Cervical seven CORPECTOMY;  Surgeon: Julio Sicks, MD;  Location: [facility] OR;  Service: Neurosurgery;  Laterality: N/A;   AV FISTULA PLACEMENT Right 05/12/2013   Procedure: INSERTION OF ARTERIOVENOUS (AV) GORE-TEX GRAFT ARM; ULTRASOUND GUIDED;  Surgeon: Fransisco Hertz, MD;  Location: [facility] OR;  Service: Vascular;  Laterality: Right;   AV FISTULA PLACEMENT Left 01/18/2015   Procedure: INSERTION OF ARTERIOVENOUS GORE-TEX GRAFT LEFT UPPER ARM;  Surgeon: Fransisco Hertz, MD;  Location: [facility] OR;  Service: Vascular;  Laterality: Left;   COLONOSCOPY  06/2012   EYE SURGERY Bilateral    retina surgery both eyes, cataract surgery both eyes   HERNIA REPAIR     right inguinal   INSERTION OF DIALYSIS CATHETER Right    IR FLUORO GUIDE CV LINE RIGHT  07/12/2023   IR PTA VENOUS EXCEPT DIALYSIS CIRCUIT  07/12/2023   left arm graft  10/2010  LIGATION ARTERIOVENOUS GORTEX GRAFT Left 05/03/2015   Procedure: LIGATION ARTERIOVENOUS GORTEX GRAFT-LEFT UPPER ARM;  Surgeon: Fransisco Hertz, MD;  Location: [facility] OR;  Service: Vascular;  Laterality: Left;   POSTERIOR CERVICAL FUSION/FORAMINOTOMY N/A 07/15/2023   Procedure: POSTERIOR CERVICAL FUSION /Cervical Five -Thoracic one;  Surgeon: Julio Sicks, MD;  Location: [facility] OR;  Service: Neurosurgery;  Laterality: N/A;   Past Medical History:  Diagnosis Date   Anemia    Arthritis    patient denies   Blind right eye     Complication of anesthesia    Diabetes mellitus    type II   Diskitis 08/06/2023   ESRD on hemodialysis [facility])    Dialysis since 2011 MWF   HCAP (healthcare-associated pneumonia) 12/19/2022   Hypertension    Legally blind    No pertinent past medical history    PONV (postoperative nausea and vomiting)    N/V- became dehydrated had to have IV fluids- 01/2015   Shortness of breath dyspnea    when has too fluid   Stroke (HCC) 07/09/2010   date per patient   Syncope    Unspecified cerebral artery occlusion with cerebral infarction 04/02/2013   There were no vitals taken for this visit.  Opioid Risk Score:   Fall Risk Score:  `1  Depression screen [facility] 2/9     08/06/2023   10:26 AM  Depression screen [facility] 2/9  Decreased Interest 0  Down, Depressed, Hopeless 0  PHQ - 2 Score 0     Review of Systems     Objective:   Physical Exam        Assessment & Plan:

## 2023-09-11 ENCOUNTER — Encounter: Payer: Medicare Other | Admitting: Physical Medicine and Rehabilitation

## 2023-09-11 DIAGNOSIS — D631 Anemia in chronic kidney disease: Secondary | ICD-10-CM | POA: Diagnosis not present

## 2023-09-11 DIAGNOSIS — N186 End stage renal disease: Secondary | ICD-10-CM | POA: Diagnosis not present

## 2023-09-11 DIAGNOSIS — D509 Iron deficiency anemia, unspecified: Secondary | ICD-10-CM | POA: Diagnosis not present

## 2023-09-11 DIAGNOSIS — N2581 Secondary hyperparathyroidism of renal origin: Secondary | ICD-10-CM | POA: Diagnosis not present

## 2023-09-11 DIAGNOSIS — E876 Hypokalemia: Secondary | ICD-10-CM | POA: Diagnosis not present

## 2023-09-11 DIAGNOSIS — Z992 Dependence on renal dialysis: Secondary | ICD-10-CM | POA: Diagnosis not present

## 2023-09-13 DIAGNOSIS — E876 Hypokalemia: Secondary | ICD-10-CM | POA: Diagnosis not present

## 2023-09-13 DIAGNOSIS — N186 End stage renal disease: Secondary | ICD-10-CM | POA: Diagnosis not present

## 2023-09-13 DIAGNOSIS — Z992 Dependence on renal dialysis: Secondary | ICD-10-CM | POA: Diagnosis not present

## 2023-09-13 DIAGNOSIS — D631 Anemia in chronic kidney disease: Secondary | ICD-10-CM | POA: Diagnosis not present

## 2023-09-13 DIAGNOSIS — I120 Hypertensive chronic kidney disease with stage 5 chronic kidney disease or end stage renal disease: Secondary | ICD-10-CM | POA: Diagnosis not present

## 2023-09-13 DIAGNOSIS — D509 Iron deficiency anemia, unspecified: Secondary | ICD-10-CM | POA: Diagnosis not present

## 2023-09-13 DIAGNOSIS — E1122 Type 2 diabetes mellitus with diabetic chronic kidney disease: Secondary | ICD-10-CM | POA: Diagnosis not present

## 2023-09-13 DIAGNOSIS — N2581 Secondary hyperparathyroidism of renal origin: Secondary | ICD-10-CM | POA: Diagnosis not present

## 2023-09-13 DIAGNOSIS — E1169 Type 2 diabetes mellitus with other specified complication: Secondary | ICD-10-CM | POA: Diagnosis not present

## 2023-09-13 DIAGNOSIS — M4642 Discitis, unspecified, cervical region: Secondary | ICD-10-CM | POA: Diagnosis not present

## 2023-09-13 DIAGNOSIS — M4622 Osteomyelitis of vertebra, cervical region: Secondary | ICD-10-CM | POA: Diagnosis not present

## 2023-09-16 DIAGNOSIS — E1165 Type 2 diabetes mellitus with hyperglycemia: Secondary | ICD-10-CM | POA: Diagnosis not present

## 2023-09-16 DIAGNOSIS — N2581 Secondary hyperparathyroidism of renal origin: Secondary | ICD-10-CM | POA: Diagnosis not present

## 2023-09-16 DIAGNOSIS — E876 Hypokalemia: Secondary | ICD-10-CM | POA: Diagnosis not present

## 2023-09-16 DIAGNOSIS — I69090 Apraxia following nontraumatic subarachnoid hemorrhage: Secondary | ICD-10-CM | POA: Diagnosis not present

## 2023-09-16 DIAGNOSIS — I119 Hypertensive heart disease without heart failure: Secondary | ICD-10-CM | POA: Diagnosis not present

## 2023-09-16 DIAGNOSIS — D509 Iron deficiency anemia, unspecified: Secondary | ICD-10-CM | POA: Diagnosis not present

## 2023-09-16 DIAGNOSIS — M4622 Osteomyelitis of vertebra, cervical region: Secondary | ICD-10-CM | POA: Diagnosis not present

## 2023-09-16 DIAGNOSIS — D631 Anemia in chronic kidney disease: Secondary | ICD-10-CM | POA: Diagnosis not present

## 2023-09-16 DIAGNOSIS — N186 End stage renal disease: Secondary | ICD-10-CM | POA: Diagnosis not present

## 2023-09-16 DIAGNOSIS — Z992 Dependence on renal dialysis: Secondary | ICD-10-CM | POA: Diagnosis not present

## 2023-09-17 DIAGNOSIS — N186 End stage renal disease: Secondary | ICD-10-CM | POA: Diagnosis not present

## 2023-09-17 DIAGNOSIS — E1122 Type 2 diabetes mellitus with diabetic chronic kidney disease: Secondary | ICD-10-CM | POA: Diagnosis not present

## 2023-09-17 DIAGNOSIS — I120 Hypertensive chronic kidney disease with stage 5 chronic kidney disease or end stage renal disease: Secondary | ICD-10-CM | POA: Diagnosis not present

## 2023-09-17 DIAGNOSIS — M4642 Discitis, unspecified, cervical region: Secondary | ICD-10-CM | POA: Diagnosis not present

## 2023-09-17 DIAGNOSIS — M4622 Osteomyelitis of vertebra, cervical region: Secondary | ICD-10-CM | POA: Diagnosis not present

## 2023-09-17 DIAGNOSIS — E1169 Type 2 diabetes mellitus with other specified complication: Secondary | ICD-10-CM | POA: Diagnosis not present

## 2023-09-18 DIAGNOSIS — D631 Anemia in chronic kidney disease: Secondary | ICD-10-CM | POA: Diagnosis not present

## 2023-09-18 DIAGNOSIS — N186 End stage renal disease: Secondary | ICD-10-CM | POA: Diagnosis not present

## 2023-09-18 DIAGNOSIS — Z992 Dependence on renal dialysis: Secondary | ICD-10-CM | POA: Diagnosis not present

## 2023-09-18 DIAGNOSIS — D509 Iron deficiency anemia, unspecified: Secondary | ICD-10-CM | POA: Diagnosis not present

## 2023-09-18 DIAGNOSIS — N2581 Secondary hyperparathyroidism of renal origin: Secondary | ICD-10-CM | POA: Diagnosis not present

## 2023-09-18 DIAGNOSIS — E876 Hypokalemia: Secondary | ICD-10-CM | POA: Diagnosis not present

## 2023-09-19 DIAGNOSIS — H18511 Endothelial corneal dystrophy, right eye: Secondary | ICD-10-CM | POA: Diagnosis not present

## 2023-09-19 DIAGNOSIS — E113553 Type 2 diabetes mellitus with stable proliferative diabetic retinopathy, bilateral: Secondary | ICD-10-CM | POA: Diagnosis not present

## 2023-09-19 DIAGNOSIS — H31093 Other chorioretinal scars, bilateral: Secondary | ICD-10-CM | POA: Diagnosis not present

## 2023-09-19 DIAGNOSIS — H33051 Total retinal detachment, right eye: Secondary | ICD-10-CM | POA: Diagnosis not present

## 2023-09-19 DIAGNOSIS — H401133 Primary open-angle glaucoma, bilateral, severe stage: Secondary | ICD-10-CM | POA: Diagnosis not present

## 2023-09-20 DIAGNOSIS — E876 Hypokalemia: Secondary | ICD-10-CM | POA: Diagnosis not present

## 2023-09-20 DIAGNOSIS — D631 Anemia in chronic kidney disease: Secondary | ICD-10-CM | POA: Diagnosis not present

## 2023-09-20 DIAGNOSIS — N186 End stage renal disease: Secondary | ICD-10-CM | POA: Diagnosis not present

## 2023-09-20 DIAGNOSIS — Z992 Dependence on renal dialysis: Secondary | ICD-10-CM | POA: Diagnosis not present

## 2023-09-20 DIAGNOSIS — N2581 Secondary hyperparathyroidism of renal origin: Secondary | ICD-10-CM | POA: Diagnosis not present

## 2023-09-20 DIAGNOSIS — D509 Iron deficiency anemia, unspecified: Secondary | ICD-10-CM | POA: Diagnosis not present

## 2023-09-23 ENCOUNTER — Encounter: Attending: Physical Medicine and Rehabilitation | Admitting: Physical Medicine and Rehabilitation

## 2023-09-23 VITALS — BP 172/79 | HR 80 | Ht 72.0 in | Wt 140.0 lb

## 2023-09-23 DIAGNOSIS — Z992 Dependence on renal dialysis: Secondary | ICD-10-CM | POA: Diagnosis not present

## 2023-09-23 DIAGNOSIS — M4622 Osteomyelitis of vertebra, cervical region: Secondary | ICD-10-CM | POA: Insufficient documentation

## 2023-09-23 DIAGNOSIS — D631 Anemia in chronic kidney disease: Secondary | ICD-10-CM | POA: Diagnosis not present

## 2023-09-23 DIAGNOSIS — R55 Syncope and collapse: Secondary | ICD-10-CM

## 2023-09-23 DIAGNOSIS — N186 End stage renal disease: Secondary | ICD-10-CM

## 2023-09-23 DIAGNOSIS — D509 Iron deficiency anemia, unspecified: Secondary | ICD-10-CM | POA: Diagnosis not present

## 2023-09-23 DIAGNOSIS — H548 Legal blindness, as defined in USA: Secondary | ICD-10-CM

## 2023-09-23 DIAGNOSIS — E876 Hypokalemia: Secondary | ICD-10-CM | POA: Diagnosis not present

## 2023-09-23 DIAGNOSIS — M4802 Spinal stenosis, cervical region: Secondary | ICD-10-CM | POA: Insufficient documentation

## 2023-09-23 DIAGNOSIS — M5412 Radiculopathy, cervical region: Secondary | ICD-10-CM | POA: Diagnosis not present

## 2023-09-23 DIAGNOSIS — N2581 Secondary hyperparathyroidism of renal origin: Secondary | ICD-10-CM | POA: Diagnosis not present

## 2023-09-23 NOTE — Patient Instructions (Signed)
 I recommend over the counter heated rice packs for your neck and shoulder tightness; you can also use muscle rubs like bengay or over the counter salonpas for problem areas. Continue tylenol as needed.  Follow up as needed

## 2023-09-23 NOTE — Progress Notes (Signed)
 Subjective:    Patient ID: Shawn [name], male    DOB: [birth year], 63 y.o.   MRN: [id number]  HPI  Shawn [name] is a 63 y.o. 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   They are presenting to PM&R clinic for follow up related to cervical OM s/p C6/7 ACDF and C5-T1 Anterior fixation and PCF by Dr. Dutch Quint. He is here s/p IPR 1/9-1/26/25.    Interval Hx:  - Therapies: Home health PT, OT and SNA arranged via [facility] at discharge; started already with PT may be releasing him soon, OT released him already. He feels it went well, he's independent of ADLs.     - Follow ups: Saw ID Dr. Daiva Eves 08/06/23 - long term Abx with Bactrim started.   Saw Dr. Dutch Quint once already; getting xrays and follow up tomorrow. Everything looked OK first follow up.   Sees Dr. Amada Jupiter on 3/27 to see how therapy is doing   - Falls: none   - DME: Using a rollator most of the time; occasional use of cane in the home. He notices he really needs the rollator after dialysis because his leg gets weaker.    - Medications: Taking tylenol PRN, takes one every few days. Finds the surgery has helped his pain quite a but.   Having a BM every 2-3 days now, which is fine.  Doesn't use sennakot beucase it cramps his stomach but uses dulcolax liquid.   No s/e to bactrim.   No orthostasis on current BP meds; does note it will go low with dialysis. He does not take his AM meds until he is back home on dialysis days.    - Other concerns:   Pain Inventory Average Pain   stiffness Pain Right Now  stiffness My pain is  stiffness  LOCATION OF PAIN  neck  BOWEL Number of stools per week: 3-5 Oral laxative use Yes  Type of laxative dulcolax Enema or suppository use No  History of colostomy No  Incontinent No   BLADDER Dialysis In and out cath, frequency dialysis on mon, wed, fri Able to self cath  . Bladder incontinence No  Frequent urination No  Leakage with coughing No  Difficulty starting stream No  Incomplete bladder emptying No    Mobility walk with assistance use a walker how many minutes can you walk? 5 ability to climb steps?  no do you drive?  no  Function disabled: date disabled . I need assistance with the following:  meal prep, household duties, and shopping  Neuro/Psych trouble walking  Prior Studies Hospital f/u  Physicians involved in your care Hospital f/u   Family History  Problem Relation Age of Onset   Diabetes Mother    Alzheimer's disease Mother    Cancer Father    Heart disease Father    Cancer Sister    Stroke Sister    Social History   Socioeconomic History   Marital status: Married    Spouse name: Not on file   Number of children: 0   Years of education: 12th   Highest education level: Not on file  Occupational History    Employer: UNEMPLOYED  Tobacco  Use   Smoking status: Never   Smokeless tobacco: Never  Vaping Use   Vaping status: Never Used  Substance and Sexual Activity   Alcohol use: No    Alcohol/week: 0.0 standard drinks of alcohol   Drug use: No   Sexual activity: Not on file  Other Topics Concern   Not on file  Social History Narrative   Patient lives at home with wife.    Patient is disabled.    Patient has no children.    Patient has a 12 grade education.       Social Drivers of Corporate investment banker Strain: Not on file  Food Insecurity: No Food Insecurity (07/09/2023)   Hunger Vital Sign    Worried About Running Out of Food in the Last Year: Never true     Ran Out of Food in the Last Year: Never true  Transportation Needs: No Transportation Needs (07/09/2023)   PRAPARE - Administrator, Civil Service (Medical): No    Lack of Transportation (Non-Medical): No  Physical Activity: Not on file  Stress: Not on file  Social Connections: Not on file   Past Surgical History:  Procedure Laterality Date   ANTERIOR CERVICAL CORPECTOMY N/A 07/15/2023   Procedure: Cervical six-Cervical seven CORPECTOMY;  Surgeon: Julio Sicks, MD;  Location: [facility] OR;  Service: Neurosurgery;  Laterality: N/A;   AV FISTULA PLACEMENT Right 05/12/2013   Procedure: INSERTION OF ARTERIOVENOUS (AV) GORE-TEX GRAFT ARM; ULTRASOUND GUIDED;  Surgeon: Fransisco Hertz, MD;  Location: [facility] OR;  Service: Vascular;  Laterality: Right;   AV FISTULA PLACEMENT Left 01/18/2015   Procedure: INSERTION OF ARTERIOVENOUS GORE-TEX GRAFT LEFT UPPER ARM;  Surgeon: Fransisco Hertz, MD;  Location: [facility] OR;  Service: Vascular;  Laterality: Left;   COLONOSCOPY  06/2012   EYE SURGERY Bilateral    retina surgery both eyes, cataract surgery both eyes   HERNIA REPAIR     right inguinal   INSERTION OF DIALYSIS CATHETER Right    IR FLUORO GUIDE CV LINE RIGHT  07/12/2023   IR PTA VENOUS EXCEPT DIALYSIS CIRCUIT  07/12/2023   left arm graft  10/2010   LIGATION ARTERIOVENOUS GORTEX GRAFT Left 05/03/2015   Procedure: LIGATION ARTERIOVENOUS GORTEX GRAFT-LEFT UPPER ARM;  Surgeon: Fransisco Hertz, MD;  Location: [facility] OR;  Service: Vascular;  Laterality: Left;   POSTERIOR CERVICAL FUSION/FORAMINOTOMY N/A 07/15/2023   Procedure: POSTERIOR CERVICAL FUSION /Cervical Five -Thoracic one;  Surgeon: Julio Sicks, MD;  Location: [facility] OR;  Service: Neurosurgery;  Laterality: N/A;   Past Medical History:  Diagnosis Date   Anemia    Arthritis    patient denies   Blind right eye    Complication of anesthesia    Diabetes mellitus    type II   Diskitis 08/06/2023   ESRD on hemodialysis [facility])    Dialysis since 2011 MWF   HCAP  (healthcare-associated pneumonia) 12/19/2022   Hypertension    Legally blind    No pertinent past medical history    PONV (postoperative nausea and vomiting)    N/V- became dehydrated had to have IV fluids- 01/2015   Shortness of breath dyspnea    when has too fluid   Stroke (HCC) 07/09/2010   date per patient   Syncope    Unspecified cerebral artery occlusion with cerebral infarction 04/02/2013   BP (!) 172/79   Pulse 80   Ht 6' (1.829 m)   Wt 140 lb (63.5 kg)   SpO2 100%  BMI 18.99 kg/m   Opioid Risk Score:   Fall Risk Score:  `1  Depression screen [facility] 2/9     08/06/2023   10:26 AM  Depression screen PHQ 2/9  Decreased Interest 0  Down, Depressed, Hopeless 0  PHQ - 2 Score 0     Review of Systems  Musculoskeletal:  Positive for gait problem and neck pain.  All other systems reviewed and are negative.     Objective:   Physical Exam   PE: Constitution: Appropriate appearance for age. No apparent distress. HEENT: + Cervical collar. Surgical sites well healed. No apparent deformity.  Resp: No respiratory distress. No accessory muscle usage. on RA and CTAB Cardio: Well perfused appearance. No peripheral edema. Abdomen: Nondistended. Nontender.   Psych: Appropriate mood and affect. Neuro: AAOx4. No apparent cognitive deficits. +Mild lethargy s/p dialysis.  +Blindness bilaterally.   Neurologic Exam:   Sensory exam: revealed normal sensation in all dermatomal regions in bilateral upper extremities and bilateral lower extremities Motor exam: strength 5/5 throughout bilateral upper extremities and with exception of 5-/5 LLE and 5-/5 RLE HF, 4/5 KE, DF, and PF Coordination: Fine motor coordination was normal.   Gait: Slow, low clearance, forward flexed but stable pattern with rollator. Wife guides with front bar due to patient blindness; seems effective.          Assessment & Plan:   Shawn [name] is a 63 y.o. 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  They are presenting to PM&R clinic for follow up related to cervical OM s/p C6/7 ACDF and C5-T1 Anterior fixation and PCF by Dr. Dutch Quint. He is here s/p IPR 1/9-1/26/25.   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   I recommend over the counter heated rice packs for your neck and shoulder tightness; you can also use muscle rubs like bengay or over the counter salonpas for problem areas. Continue tylenol as needed.  Follow up as needed

## 2023-09-24 DIAGNOSIS — M4802 Spinal stenosis, cervical region: Secondary | ICD-10-CM | POA: Diagnosis not present

## 2023-09-25 DIAGNOSIS — N2581 Secondary hyperparathyroidism of renal origin: Secondary | ICD-10-CM | POA: Diagnosis not present

## 2023-09-25 DIAGNOSIS — Z992 Dependence on renal dialysis: Secondary | ICD-10-CM | POA: Diagnosis not present

## 2023-09-25 DIAGNOSIS — D631 Anemia in chronic kidney disease: Secondary | ICD-10-CM | POA: Diagnosis not present

## 2023-09-25 DIAGNOSIS — D509 Iron deficiency anemia, unspecified: Secondary | ICD-10-CM | POA: Diagnosis not present

## 2023-09-25 DIAGNOSIS — E876 Hypokalemia: Secondary | ICD-10-CM | POA: Diagnosis not present

## 2023-09-25 DIAGNOSIS — N186 End stage renal disease: Secondary | ICD-10-CM | POA: Diagnosis not present

## 2023-09-26 DIAGNOSIS — M4622 Osteomyelitis of vertebra, cervical region: Secondary | ICD-10-CM | POA: Diagnosis not present

## 2023-09-26 DIAGNOSIS — E1169 Type 2 diabetes mellitus with other specified complication: Secondary | ICD-10-CM | POA: Diagnosis not present

## 2023-09-26 DIAGNOSIS — E1122 Type 2 diabetes mellitus with diabetic chronic kidney disease: Secondary | ICD-10-CM | POA: Diagnosis not present

## 2023-09-26 DIAGNOSIS — N186 End stage renal disease: Secondary | ICD-10-CM | POA: Diagnosis not present

## 2023-09-26 DIAGNOSIS — I120 Hypertensive chronic kidney disease with stage 5 chronic kidney disease or end stage renal disease: Secondary | ICD-10-CM | POA: Diagnosis not present

## 2023-09-26 DIAGNOSIS — M4642 Discitis, unspecified, cervical region: Secondary | ICD-10-CM | POA: Diagnosis not present

## 2023-09-27 DIAGNOSIS — N186 End stage renal disease: Secondary | ICD-10-CM | POA: Diagnosis not present

## 2023-09-27 DIAGNOSIS — D509 Iron deficiency anemia, unspecified: Secondary | ICD-10-CM | POA: Diagnosis not present

## 2023-09-27 DIAGNOSIS — Z992 Dependence on renal dialysis: Secondary | ICD-10-CM | POA: Diagnosis not present

## 2023-09-27 DIAGNOSIS — D631 Anemia in chronic kidney disease: Secondary | ICD-10-CM | POA: Diagnosis not present

## 2023-09-27 DIAGNOSIS — E876 Hypokalemia: Secondary | ICD-10-CM | POA: Diagnosis not present

## 2023-09-27 DIAGNOSIS — N2581 Secondary hyperparathyroidism of renal origin: Secondary | ICD-10-CM | POA: Diagnosis not present

## 2023-09-30 ENCOUNTER — Encounter: Payer: Self-pay | Admitting: Infectious Disease

## 2023-09-30 DIAGNOSIS — Z992 Dependence on renal dialysis: Secondary | ICD-10-CM | POA: Diagnosis not present

## 2023-09-30 DIAGNOSIS — E876 Hypokalemia: Secondary | ICD-10-CM | POA: Diagnosis not present

## 2023-09-30 DIAGNOSIS — D631 Anemia in chronic kidney disease: Secondary | ICD-10-CM | POA: Diagnosis not present

## 2023-09-30 DIAGNOSIS — T847XXA Infection and inflammatory reaction due to other internal orthopedic prosthetic devices, implants and grafts, initial encounter: Secondary | ICD-10-CM | POA: Insufficient documentation

## 2023-09-30 DIAGNOSIS — N186 End stage renal disease: Secondary | ICD-10-CM | POA: Diagnosis not present

## 2023-09-30 DIAGNOSIS — N2581 Secondary hyperparathyroidism of renal origin: Secondary | ICD-10-CM | POA: Diagnosis not present

## 2023-09-30 DIAGNOSIS — D509 Iron deficiency anemia, unspecified: Secondary | ICD-10-CM | POA: Diagnosis not present

## 2023-09-30 DIAGNOSIS — R7881 Bacteremia: Secondary | ICD-10-CM | POA: Insufficient documentation

## 2023-09-30 DIAGNOSIS — B957 Other staphylococcus as the cause of diseases classified elsewhere: Secondary | ICD-10-CM

## 2023-09-30 HISTORY — DX: Infection and inflammatory reaction due to other internal orthopedic prosthetic devices, implants and grafts, initial encounter: T84.7XXA

## 2023-09-30 HISTORY — DX: Other staphylococcus as the cause of diseases classified elsewhere: B95.7

## 2023-09-30 NOTE — Progress Notes (Unsigned)
 Subjective:  Chief complaint follow-up for spinal infection complicated by hardware  Patient ID: Shawn [name], male    DOB: Dec 28, 1960, 63 y.o.   MRN: [id number]  HPI  63 y.o. male with with history of end-stage renal disease on hemodialysis with limited access options he was admitted with Staphylococcus epidermidis bacteremia in the context of C6-C7 discitis osteomyelitis with severe spinal canal stenosis  he never had a true catheter holiday as HD catheter could only be exchanged. He did undergo Neurosurgery with  C6 and C7 anterior cervical corpectomy microdissection with C5-T1 anterior cervical fusion using interbody cage harvested autograft and morselized allograft as well as C5-T1 anterior plate fixation and C5-T1 posterior cervical fusion by Dr. Dutch Quint. He completed IV vancomycin in inpatient rehab and was to have been over to doxycycline but does not appear to have been changed over and doxycycline actually would not of been active against his methicillin resistant Staphylococcus epidermidis.  His neck pain is improved dramatically as has his strength in his upper and lower extremities.  He is having dialysis on Monday Wednesday and Friday.  Discussed the use of AI scribe software for clinical note transcription with the patient, who gave verbal consent to proceed.  History of Present Illness   The patient, with a history of staphylococcus epidermidis bacteremia and spinal infection, involving hardware presents for a follow-up visit. He was previously treated with IV antibiotics during dialysis and is currently on oral Bactrim, which he takes daily after dialysis. He reports a significant improvement in his neck stiffness compared to a few months ago. However, he still experiences some stiffness in the neck muscles. He has a history of spinal surgery with hardware placement, which increases the risk of persistent infection.         Past Medical History:  Diagnosis Date    Anemia    Arthritis    patient denies   Blind right eye    Complication of anesthesia    Diabetes mellitus    type II   Diskitis 08/06/2023   ESRD on hemodialysis [facility])    Dialysis since 2011 MWF   HCAP (healthcare-associated pneumonia) 12/19/2022   Hypertension    Legally blind    No pertinent past medical history    PONV (postoperative nausea and vomiting)    N/V- became dehydrated had to have IV fluids- 01/2015   Shortness of breath dyspnea    when has too fluid   Stroke (HCC) 07/09/2010   date per patient   Syncope    Unspecified cerebral artery occlusion with cerebral infarction 04/02/2013    Past Surgical History:  Procedure Laterality Date   ANTERIOR CERVICAL CORPECTOMY N/A 07/15/2023   Procedure: Cervical six-Cervical seven CORPECTOMY;  Surgeon: Julio Sicks, MD;  Location: [facility] OR;  Service: Neurosurgery;  Laterality: N/A;   AV FISTULA PLACEMENT Right 05/12/2013   Procedure: INSERTION OF ARTERIOVENOUS (AV) GORE-TEX GRAFT ARM; ULTRASOUND GUIDED;  Surgeon: Fransisco Hertz, MD;  Location: [facility] OR;  Service: Vascular;  Laterality: Right;   AV FISTULA PLACEMENT Left 01/18/2015   Procedure: INSERTION OF ARTERIOVENOUS GORE-TEX GRAFT LEFT UPPER ARM;  Surgeon: Fransisco Hertz, MD;  Location: [facility] OR;  Service: Vascular;  Laterality: Left;   COLONOSCOPY  06/2012   EYE SURGERY Bilateral    retina surgery both eyes, cataract surgery both eyes   HERNIA REPAIR     right inguinal   INSERTION OF DIALYSIS CATHETER Right    IR FLUORO GUIDE CV LINE RIGHT  07/12/2023  IR PTA VENOUS EXCEPT DIALYSIS CIRCUIT  07/12/2023   left arm graft  10/2010   LIGATION ARTERIOVENOUS GORTEX GRAFT Left 05/03/2015   Procedure: LIGATION ARTERIOVENOUS GORTEX GRAFT-LEFT UPPER ARM;  Surgeon: Fransisco Hertz, MD;  Location: [facility] OR;  Service: Vascular;  Laterality: Left;   POSTERIOR CERVICAL FUSION/FORAMINOTOMY N/A 07/15/2023   Procedure: POSTERIOR CERVICAL FUSION /Cervical Five -Thoracic one;  Surgeon: Julio Sicks, MD;  Location: [facility]  OR;  Service: Neurosurgery;  Laterality: N/A;    Family History  Problem Relation Age of Onset   Diabetes Mother    Alzheimer's disease Mother    Cancer Father    Heart disease Father    Cancer Sister    Stroke Sister       Social History   Socioeconomic History   Marital status: Married    Spouse name: Not on file   Number of children: 0   Years of education: 12th   Highest education level: Not on file  Occupational History    Employer: UNEMPLOYED  Tobacco Use   Smoking status: Never   Smokeless tobacco: Never  Vaping Use   Vaping status: Never Used  Substance and Sexual Activity   Alcohol use: No    Alcohol/week: 0.0 standard drinks of alcohol   Drug use: No   Sexual activity: Not on file  Other Topics Concern   Not on file  Social History Narrative   Patient lives at home with wife.    Patient is disabled.    Patient has no children.    Patient has a 12 grade education.       Social Drivers of Corporate investment banker Strain: Not on file  Food Insecurity: No Food Insecurity (07/09/2023)   Hunger Vital Sign    Worried About Running Out of Food in the Last Year: Never true    Ran Out of Food in the Last Year: Never true  Transportation Needs: No Transportation Needs (07/09/2023)   PRAPARE - Administrator, Civil Service (Medical): No    Lack of Transportation (Non-Medical): No  Physical Activity: Not on file  Stress: Not on file  Social Connections: Not on file    No Known Allergies   Current Outpatient Medications:    acetaminophen (TYLENOL) 325 MG tablet, Take 1-2 tablets (325-650 mg total) by mouth every 6 (six) hours as needed., Disp: , Rfl:    amLODipine (NORVASC) 5 MG tablet, Take 1 tablet (5 mg total) by mouth daily., Disp: 30 tablet, Rfl: 0   aspirin EC 325 MG tablet, Take 325 mg by mouth at bedtime., Disp: , Rfl:    atorvastatin (LIPITOR) 80 MG tablet, Take 1 tablet (80 mg total) by mouth daily., Disp: 90 tablet, Rfl: 11    bisacodyl (DULCOLAX) 10 MG suppository, Place 1 suppository (10 mg total) rectally daily as needed for moderate constipation., Disp: 4 suppository, Rfl: 1   brimonidine (ALPHAGAN) 0.2 % ophthalmic solution, Place 1 drop into both eyes 2 (two) times daily., Disp: , Rfl:    calcitRIOL (ROCALTROL) 0.5 MCG capsule, Take 1 capsule (0.5 mcg total) by mouth 3 (three) times a week. (Patient taking differently: Take 0.5 mcg by mouth every Monday, Wednesday, and Friday.), Disp: 30 capsule, Rfl: 0   carvedilol (COREG) 3.125 MG tablet, Take 1 tablet (3.125 mg total) by mouth 2 (two) times daily with a meal., Disp: 60 tablet, Rfl: 0   Darbepoetin Alfa (ARANESP) 100 MCG/0.5ML SOSY injection, Inject 0.5 mLs (100 mcg total)  into the skin every Friday at 6 PM., Disp: 4.2 mL, Rfl: 0   dorzolamide-timolol (COSOPT) 22.3-6.8 MG/ML ophthalmic solution, Place 1 drop into both eyes 2 (two) times daily., Disp: , Rfl:    lanthanum (FOSRENOL) 1000 MG chewable tablet, Chew 0.5 tablets (500 mg total) by mouth 3 (three) times daily with meals., Disp: , Rfl:    latanoprost (XALATAN) 0.005 % ophthalmic solution, Place 1 drop into both eyes at bedtime. , Disp: , Rfl:    lisinopril (ZESTRIL) 20 MG tablet, Take 20 mg by mouth 2 (two) times daily., Disp: , Rfl:    melatonin 5 MG TABS, Take 1 tablet (5 mg total) by mouth at bedtime., Disp: 30 tablet, Rfl: 0   polyethylene glycol (MIRALAX / GLYCOLAX) 17 g packet, Take 17 g by mouth daily as needed for mild constipation., Disp: , Rfl:    sulfamethoxazole-trimethoprim (BACTRIM) 400-80 MG tablet, Take 1 tablet by mouth daily. On dialysis days take after dialysis, Disp: 30 tablet, Rfl: 0   sulfamethoxazole-trimethoprim (BACTRIM) 400-80 MG tablet, Take 1 tablet by mouth daily. On HD days take after dialysis, Disp: 90 tablet, Rfl: 3   Review of Systems  Constitutional:  Negative for activity change, appetite change, chills, diaphoresis, fatigue, fever and unexpected weight change.  HENT:   Negative for congestion, rhinorrhea, sinus pressure, sneezing, sore throat and trouble swallowing.   Eyes:  Negative for photophobia and visual disturbance.  Respiratory:  Negative for cough, chest tightness, shortness of breath, wheezing and stridor.   Cardiovascular:  Negative for chest pain, palpitations and leg swelling.  Gastrointestinal:  Negative for abdominal distention, abdominal pain, anal bleeding, blood in stool, constipation, diarrhea, nausea and vomiting.  Genitourinary:  Negative for difficulty urinating, dysuria, flank pain and hematuria.  Musculoskeletal:  Negative for arthralgias, back pain, gait problem, joint swelling and myalgias.  Skin:  Negative for color change, pallor, rash and wound.  Neurological:  Negative for dizziness, tremors, weakness and light-headedness.  Hematological:  Negative for adenopathy. Does not bruise/bleed easily.  Psychiatric/Behavioral:  Negative for agitation, behavioral problems, confusion, decreased concentration, dysphoric mood and sleep disturbance.        Objective:   Physical Exam Constitutional:      Appearance: He is well-developed.  HENT:     Head: Normocephalic and atraumatic.  Eyes:     Conjunctiva/sclera: Conjunctivae normal.  Cardiovascular:     Rate and Rhythm: Normal rate and regular rhythm.  Pulmonary:     Effort: Pulmonary effort is normal. No respiratory distress.     Breath sounds: No wheezing.  Abdominal:     General: There is no distension.     Palpations: Abdomen is soft.  Musculoskeletal:        General: No tenderness. Normal range of motion.     Cervical back: Normal range of motion and neck supple.  Skin:    General: Skin is warm and dry.     Coloration: Skin is not pale.     Findings: No erythema or rash.  Neurological:     General: No focal deficit present.     Mental Status: He is alert and oriented to person, place, and time.  Psychiatric:        Mood and Affect: Mood normal.        Behavior:  Behavior normal.        Thought Content: Thought content normal.        Judgment: Judgment normal.  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 Positive response to treatment indicated by reduced neck stiffness and pain. Long-term antibiotics required due to hardware. - Continue Bactrim daily post-dialysis. - Order ESR and CRP to monitor inflammation. - Plan antibiotics for at least one year, reassess continuation at that point. - Schedule follow-up in six months. - Communicate lab results via phone.

## 2023-10-01 ENCOUNTER — Encounter: Payer: Self-pay | Admitting: Infectious Disease

## 2023-10-01 ENCOUNTER — Ambulatory Visit (INDEPENDENT_AMBULATORY_CARE_PROVIDER_SITE_OTHER): Payer: Self-pay | Admitting: Infectious Disease

## 2023-10-01 ENCOUNTER — Other Ambulatory Visit: Payer: Self-pay

## 2023-10-01 VITALS — BP 120/68 | HR 68 | Resp 16 | Ht 72.0 in | Wt 146.0 lb

## 2023-10-01 DIAGNOSIS — M4642 Discitis, unspecified, cervical region: Secondary | ICD-10-CM | POA: Diagnosis not present

## 2023-10-01 DIAGNOSIS — B957 Other staphylococcus as the cause of diseases classified elsewhere: Secondary | ICD-10-CM | POA: Diagnosis not present

## 2023-10-01 DIAGNOSIS — T847XXD Infection and inflammatory reaction due to other internal orthopedic prosthetic devices, implants and grafts, subsequent encounter: Secondary | ICD-10-CM

## 2023-10-01 DIAGNOSIS — R7881 Bacteremia: Secondary | ICD-10-CM

## 2023-10-01 DIAGNOSIS — N186 End stage renal disease: Secondary | ICD-10-CM

## 2023-10-01 MED ORDER — SULFAMETHOXAZOLE-TRIMETHOPRIM 400-80 MG PO TABS: 1.0000 | ORAL_TABLET | Freq: Every day | ORAL | 3 refills | Status: DC

## 2023-10-02 ENCOUNTER — Telehealth: Payer: Self-pay

## 2023-10-02 DIAGNOSIS — D631 Anemia in chronic kidney disease: Secondary | ICD-10-CM | POA: Diagnosis not present

## 2023-10-02 DIAGNOSIS — D509 Iron deficiency anemia, unspecified: Secondary | ICD-10-CM | POA: Diagnosis not present

## 2023-10-02 DIAGNOSIS — E876 Hypokalemia: Secondary | ICD-10-CM | POA: Diagnosis not present

## 2023-10-02 DIAGNOSIS — N186 End stage renal disease: Secondary | ICD-10-CM | POA: Diagnosis not present

## 2023-10-02 DIAGNOSIS — N2581 Secondary hyperparathyroidism of renal origin: Secondary | ICD-10-CM | POA: Diagnosis not present

## 2023-10-02 DIAGNOSIS — Z992 Dependence on renal dialysis: Secondary | ICD-10-CM | POA: Diagnosis not present

## 2023-10-02 LAB — C-REACTIVE PROTEIN: CRP: 5 mg/L (ref <8.0)

## 2023-10-02 LAB — SEDIMENTATION RATE: Sed Rate: 11 mm/h (ref 0–20)

## 2023-10-02 NOTE — Telephone Encounter (Signed)
 Per Dr. Daiva Eves:  Meg can you or someone in triage call him and tell him his esr and crp are normal. He can continue antibiotics as planned. He asked for call because he is blind and can't do my chart   Called and spoke with Shawn [name], notified him inflammatory markers are normal and to continue antibiotics as planned. Patient verbalized understanding and has no further questions.   Sandie Ano, RN

## 2023-10-03 DIAGNOSIS — I119 Hypertensive heart disease without heart failure: Secondary | ICD-10-CM | POA: Diagnosis not present

## 2023-10-03 DIAGNOSIS — G4700 Insomnia, unspecified: Secondary | ICD-10-CM | POA: Diagnosis not present

## 2023-10-03 DIAGNOSIS — M1220 Villonodular synovitis (pigmented), unspecified site: Secondary | ICD-10-CM | POA: Diagnosis not present

## 2023-10-03 DIAGNOSIS — M4642 Discitis, unspecified, cervical region: Secondary | ICD-10-CM | POA: Diagnosis not present

## 2023-10-03 DIAGNOSIS — E1169 Type 2 diabetes mellitus with other specified complication: Secondary | ICD-10-CM | POA: Diagnosis not present

## 2023-10-03 DIAGNOSIS — N186 End stage renal disease: Secondary | ICD-10-CM | POA: Diagnosis not present

## 2023-10-03 DIAGNOSIS — Z1331 Encounter for screening for depression: Secondary | ICD-10-CM | POA: Diagnosis not present

## 2023-10-03 DIAGNOSIS — E1122 Type 2 diabetes mellitus with diabetic chronic kidney disease: Secondary | ICD-10-CM | POA: Diagnosis not present

## 2023-10-03 DIAGNOSIS — E1165 Type 2 diabetes mellitus with hyperglycemia: Secondary | ICD-10-CM | POA: Diagnosis not present

## 2023-10-03 DIAGNOSIS — Z0001 Encounter for general adult medical examination with abnormal findings: Secondary | ICD-10-CM | POA: Diagnosis not present

## 2023-10-03 DIAGNOSIS — I6990 Unspecified sequelae of unspecified cerebrovascular disease: Secondary | ICD-10-CM | POA: Diagnosis not present

## 2023-10-03 DIAGNOSIS — M4622 Osteomyelitis of vertebra, cervical region: Secondary | ICD-10-CM | POA: Diagnosis not present

## 2023-10-03 DIAGNOSIS — I120 Hypertensive chronic kidney disease with stage 5 chronic kidney disease or end stage renal disease: Secondary | ICD-10-CM | POA: Diagnosis not present

## 2023-10-03 DIAGNOSIS — E7800 Pure hypercholesterolemia, unspecified: Secondary | ICD-10-CM | POA: Diagnosis not present

## 2023-10-03 DIAGNOSIS — Z79899 Other long term (current) drug therapy: Secondary | ICD-10-CM | POA: Diagnosis not present

## 2023-10-03 DIAGNOSIS — H548 Legal blindness, as defined in USA: Secondary | ICD-10-CM | POA: Diagnosis not present

## 2023-10-03 DIAGNOSIS — E559 Vitamin D deficiency, unspecified: Secondary | ICD-10-CM | POA: Diagnosis not present

## 2023-10-04 DIAGNOSIS — N2581 Secondary hyperparathyroidism of renal origin: Secondary | ICD-10-CM | POA: Diagnosis not present

## 2023-10-04 DIAGNOSIS — D631 Anemia in chronic kidney disease: Secondary | ICD-10-CM | POA: Diagnosis not present

## 2023-10-04 DIAGNOSIS — Z992 Dependence on renal dialysis: Secondary | ICD-10-CM | POA: Diagnosis not present

## 2023-10-04 DIAGNOSIS — N186 End stage renal disease: Secondary | ICD-10-CM | POA: Diagnosis not present

## 2023-10-04 DIAGNOSIS — D509 Iron deficiency anemia, unspecified: Secondary | ICD-10-CM | POA: Diagnosis not present

## 2023-10-04 DIAGNOSIS — E876 Hypokalemia: Secondary | ICD-10-CM | POA: Diagnosis not present

## 2023-10-07 DIAGNOSIS — N2581 Secondary hyperparathyroidism of renal origin: Secondary | ICD-10-CM | POA: Diagnosis not present

## 2023-10-07 DIAGNOSIS — N186 End stage renal disease: Secondary | ICD-10-CM | POA: Diagnosis not present

## 2023-10-07 DIAGNOSIS — E876 Hypokalemia: Secondary | ICD-10-CM | POA: Diagnosis not present

## 2023-10-07 DIAGNOSIS — D509 Iron deficiency anemia, unspecified: Secondary | ICD-10-CM | POA: Diagnosis not present

## 2023-10-07 DIAGNOSIS — D631 Anemia in chronic kidney disease: Secondary | ICD-10-CM | POA: Diagnosis not present

## 2023-10-07 DIAGNOSIS — Z992 Dependence on renal dialysis: Secondary | ICD-10-CM | POA: Diagnosis not present

## 2023-10-08 DIAGNOSIS — I120 Hypertensive chronic kidney disease with stage 5 chronic kidney disease or end stage renal disease: Secondary | ICD-10-CM | POA: Diagnosis not present

## 2023-10-08 DIAGNOSIS — N186 End stage renal disease: Secondary | ICD-10-CM | POA: Diagnosis not present

## 2023-10-08 DIAGNOSIS — Z992 Dependence on renal dialysis: Secondary | ICD-10-CM | POA: Diagnosis not present

## 2023-10-09 DIAGNOSIS — N2581 Secondary hyperparathyroidism of renal origin: Secondary | ICD-10-CM | POA: Diagnosis not present

## 2023-10-09 DIAGNOSIS — N186 End stage renal disease: Secondary | ICD-10-CM | POA: Diagnosis not present

## 2023-10-09 DIAGNOSIS — D509 Iron deficiency anemia, unspecified: Secondary | ICD-10-CM | POA: Diagnosis not present

## 2023-10-09 DIAGNOSIS — E119 Type 2 diabetes mellitus without complications: Secondary | ICD-10-CM | POA: Diagnosis not present

## 2023-10-09 DIAGNOSIS — E876 Hypokalemia: Secondary | ICD-10-CM | POA: Diagnosis not present

## 2023-10-09 DIAGNOSIS — Z992 Dependence on renal dialysis: Secondary | ICD-10-CM | POA: Diagnosis not present

## 2023-10-09 DIAGNOSIS — D631 Anemia in chronic kidney disease: Secondary | ICD-10-CM | POA: Diagnosis not present

## 2023-10-11 DIAGNOSIS — N2581 Secondary hyperparathyroidism of renal origin: Secondary | ICD-10-CM | POA: Diagnosis not present

## 2023-10-11 DIAGNOSIS — E876 Hypokalemia: Secondary | ICD-10-CM | POA: Diagnosis not present

## 2023-10-11 DIAGNOSIS — D631 Anemia in chronic kidney disease: Secondary | ICD-10-CM | POA: Diagnosis not present

## 2023-10-11 DIAGNOSIS — N186 End stage renal disease: Secondary | ICD-10-CM | POA: Diagnosis not present

## 2023-10-11 DIAGNOSIS — Z992 Dependence on renal dialysis: Secondary | ICD-10-CM | POA: Diagnosis not present

## 2023-10-11 DIAGNOSIS — D509 Iron deficiency anemia, unspecified: Secondary | ICD-10-CM | POA: Diagnosis not present

## 2023-10-14 DIAGNOSIS — Z992 Dependence on renal dialysis: Secondary | ICD-10-CM | POA: Diagnosis not present

## 2023-10-14 DIAGNOSIS — N2581 Secondary hyperparathyroidism of renal origin: Secondary | ICD-10-CM | POA: Diagnosis not present

## 2023-10-14 DIAGNOSIS — D631 Anemia in chronic kidney disease: Secondary | ICD-10-CM | POA: Diagnosis not present

## 2023-10-14 DIAGNOSIS — D509 Iron deficiency anemia, unspecified: Secondary | ICD-10-CM | POA: Diagnosis not present

## 2023-10-14 DIAGNOSIS — N186 End stage renal disease: Secondary | ICD-10-CM | POA: Diagnosis not present

## 2023-10-14 DIAGNOSIS — E876 Hypokalemia: Secondary | ICD-10-CM | POA: Diagnosis not present

## 2023-10-16 DIAGNOSIS — Z992 Dependence on renal dialysis: Secondary | ICD-10-CM | POA: Diagnosis not present

## 2023-10-16 DIAGNOSIS — D509 Iron deficiency anemia, unspecified: Secondary | ICD-10-CM | POA: Diagnosis not present

## 2023-10-16 DIAGNOSIS — D631 Anemia in chronic kidney disease: Secondary | ICD-10-CM | POA: Diagnosis not present

## 2023-10-16 DIAGNOSIS — E876 Hypokalemia: Secondary | ICD-10-CM | POA: Diagnosis not present

## 2023-10-16 DIAGNOSIS — N2581 Secondary hyperparathyroidism of renal origin: Secondary | ICD-10-CM | POA: Diagnosis not present

## 2023-10-16 DIAGNOSIS — N186 End stage renal disease: Secondary | ICD-10-CM | POA: Diagnosis not present

## 2023-10-18 DIAGNOSIS — D631 Anemia in chronic kidney disease: Secondary | ICD-10-CM | POA: Diagnosis not present

## 2023-10-18 DIAGNOSIS — E876 Hypokalemia: Secondary | ICD-10-CM | POA: Diagnosis not present

## 2023-10-18 DIAGNOSIS — Z992 Dependence on renal dialysis: Secondary | ICD-10-CM | POA: Diagnosis not present

## 2023-10-18 DIAGNOSIS — D509 Iron deficiency anemia, unspecified: Secondary | ICD-10-CM | POA: Diagnosis not present

## 2023-10-18 DIAGNOSIS — N186 End stage renal disease: Secondary | ICD-10-CM | POA: Diagnosis not present

## 2023-10-18 DIAGNOSIS — N2581 Secondary hyperparathyroidism of renal origin: Secondary | ICD-10-CM | POA: Diagnosis not present

## 2023-10-21 DIAGNOSIS — N2581 Secondary hyperparathyroidism of renal origin: Secondary | ICD-10-CM | POA: Diagnosis not present

## 2023-10-21 DIAGNOSIS — D509 Iron deficiency anemia, unspecified: Secondary | ICD-10-CM | POA: Diagnosis not present

## 2023-10-21 DIAGNOSIS — D631 Anemia in chronic kidney disease: Secondary | ICD-10-CM | POA: Diagnosis not present

## 2023-10-21 DIAGNOSIS — N186 End stage renal disease: Secondary | ICD-10-CM | POA: Diagnosis not present

## 2023-10-21 DIAGNOSIS — E876 Hypokalemia: Secondary | ICD-10-CM | POA: Diagnosis not present

## 2023-10-21 DIAGNOSIS — Z992 Dependence on renal dialysis: Secondary | ICD-10-CM | POA: Diagnosis not present

## 2023-10-22 ENCOUNTER — Ambulatory Visit (INDEPENDENT_AMBULATORY_CARE_PROVIDER_SITE_OTHER): Payer: Medicare Other | Admitting: Podiatry

## 2023-10-22 ENCOUNTER — Encounter: Payer: Self-pay | Admitting: Podiatry

## 2023-10-22 VITALS — Ht 72.0 in | Wt 146.0 lb

## 2023-10-22 DIAGNOSIS — E1151 Type 2 diabetes mellitus with diabetic peripheral angiopathy without gangrene: Secondary | ICD-10-CM

## 2023-10-22 DIAGNOSIS — E119 Type 2 diabetes mellitus without complications: Secondary | ICD-10-CM | POA: Diagnosis not present

## 2023-10-22 DIAGNOSIS — M2042 Other hammer toe(s) (acquired), left foot: Secondary | ICD-10-CM | POA: Diagnosis not present

## 2023-10-22 DIAGNOSIS — M79675 Pain in left toe(s): Secondary | ICD-10-CM

## 2023-10-22 DIAGNOSIS — M2041 Other hammer toe(s) (acquired), right foot: Secondary | ICD-10-CM | POA: Diagnosis not present

## 2023-10-22 DIAGNOSIS — M79674 Pain in right toe(s): Secondary | ICD-10-CM | POA: Diagnosis not present

## 2023-10-22 DIAGNOSIS — Z992 Dependence on renal dialysis: Secondary | ICD-10-CM | POA: Diagnosis not present

## 2023-10-22 DIAGNOSIS — N186 End stage renal disease: Secondary | ICD-10-CM | POA: Diagnosis not present

## 2023-10-22 DIAGNOSIS — B351 Tinea unguium: Secondary | ICD-10-CM | POA: Diagnosis not present

## 2023-10-22 NOTE — Progress Notes (Signed)
 ANNUAL DIABETIC FOOT EXAM  Subjective: Shawn [name] presents today for annual diabetic foot exam. Chief Complaint  Patient presents with   Nail Problem    Pt is here for [facility] last A1C was 6.2 PCP is Kilpatrick and LOV was in March.   Patient confirms h/o diabetes.  Patient denies any h/o foot wounds.  Patient has been diagnosed with neuropathy.  Shawn Matsu, MD is patient's PCP.  Past Medical History:  Diagnosis Date   Anemia    Arthritis    patient denies   Blind right eye    Complication of anesthesia    Diabetes mellitus    type II   Diskitis 08/06/2023   ESRD on hemodialysis [facility])    Dialysis since 2011 MWF   Hardware complicating wound infection (HCC) 09/30/2023   HCAP (healthcare-associated pneumonia) 12/19/2022   Hypertension    Legally blind    No pertinent past medical history    PONV (postoperative nausea and vomiting)    N/V- became dehydrated had to have IV fluids- 01/2015   Shortness of breath dyspnea    when has too fluid   Staphylococcus epidermidis bacteremia 09/30/2023   Stroke (HCC) 07/09/2010   date per patient   Syncope    Unspecified cerebral artery occlusion with cerebral infarction 04/02/2013   Patient Active Problem List   Diagnosis Date Noted   Hardware complicating wound infection (HCC) 09/30/2023   Staphylococcus epidermidis bacteremia 09/30/2023   Diskitis 08/06/2023   Osteomyelitis of cervical spine (HCC) 07/18/2023   Acute osteomyelitis of cervical spine (HCC) 07/09/2023   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 07/09/2023   HCAP (healthcare-associated pneumonia) 12/19/2022   Acute respiratory failure with hypoxia (HCC) 10/07/2022   Bilateral pleural effusion 10/07/2022   Interstitial edema 10/07/2022   Uncontrolled type 2 diabetes mellitus with hyperglycemia, with long-term current use of insulin  (HCC) 10/07/2022   Hyponatremia 10/07/2022   Hypermagnesemia 10/07/2022   Thrombocytosis 10/07/2022    Elevated troponin 10/07/2022   Other disorders of phosphorus metabolism 01/03/2022   Unspecified kidney failure 01/03/2022   Fluid overload, unspecified 12/15/2019   Skin lesion 06/25/2019   Breast lump 06/25/2019   Chronic systolic heart failure (HCC) 06/24/2019   Abnormal findings on diagnostic imaging of lung 06/15/2019   Shortness of breath 06/15/2019   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   Mild protein-calorie malnutrition (HCC) 06/10/2019   S/P thoracentesis    Acute respiratory failure with hypercapnia (HCC) 05/25/2019   Acute hypercapnic respiratory failure (HCC) 05/25/2019   History of CVA (cerebrovascular accident) 05/05/2019   Pure hypercholesterolemia 05/05/2019   Hypokalemia 04/17/2019   Pain due to onychomycosis of toenails of both feet 12/23/2018   Heart palpitations 12/21/2017   Tingling of left upper extremity 12/21/2017   Cerebral infarction, unspecified (HCC) 05/23/2017   Syncope 01/23/2016   Essential hypertension 01/23/2016   Orthostatic hypotension 01/23/2016   Legally blind    Diabetes (HCC)    Diabetes mellitus with complication (HCC)    Complication of vascular dialysis catheter 12/14/2015   Dependence on renal dialysis (HCC) 05/03/2015   Intracranial atherosclerosis 08/10/2014   Other disorders of plasma-protein metabolism, not elsewhere classified 07/15/2014   Pain, unspecified 03/25/2014   Type 2 diabetes mellitus with diabetic peripheral angiopathy without gangrene (HCC) 03/25/2014   Encounter for screening for respiratory tuberculosis 10/02/2013   End-stage renal disease on hemodialysis (HCC) 06/12/2013   ESRD (end stage renal disease) (HCC) 04/17/2013   Coagulation defect, unspecified (HCC) 04/14/2013   Cerebral artery occlusion  with cerebral infarction (HCC) 04/02/2013   Dizziness and giddiness 04/02/2013   Anemia in chronic kidney disease 09/04/2011   Secondary hyperparathyroidism of renal origin (HCC) 09/04/2011   Mixed hyperlipidemia 02/16/2010    Hyperparathyroidism, unspecified (HCC) 02/16/2010   Iron deficiency anemia, unspecified 02/16/2010   Past Surgical History:  Procedure Laterality Date   ANTERIOR CERVICAL CORPECTOMY N/A 07/15/2023   Procedure: Cervical six-Cervical seven CORPECTOMY;  Surgeon: Agustina Aldrich, MD;  Location: [facility] OR;  Service: Neurosurgery;  Laterality: N/A;   AV FISTULA PLACEMENT Right 05/12/2013   Procedure: INSERTION OF ARTERIOVENOUS (AV) GORE-TEX GRAFT ARM; ULTRASOUND GUIDED;  Surgeon: Arvil Lauber, MD;  Location: [facility] OR;  Service: Vascular;  Laterality: Right;   AV FISTULA PLACEMENT Left 01/18/2015   Procedure: INSERTION OF ARTERIOVENOUS GORE-TEX GRAFT LEFT UPPER ARM;  Surgeon: Arvil Lauber, MD;  Location: [facility] OR;  Service: Vascular;  Laterality: Left;   COLONOSCOPY  06/2012   EYE SURGERY Bilateral    retina surgery both eyes, cataract surgery both eyes   HERNIA REPAIR     right inguinal   INSERTION OF DIALYSIS CATHETER Right    IR FLUORO GUIDE CV LINE RIGHT  07/12/2023   IR PTA VENOUS EXCEPT DIALYSIS CIRCUIT  07/12/2023   left arm graft  10/2010   LIGATION ARTERIOVENOUS GORTEX GRAFT Left 05/03/2015   Procedure: LIGATION ARTERIOVENOUS GORTEX GRAFT-LEFT UPPER ARM;  Surgeon: Arvil Lauber, MD;  Location: [facility] OR;  Service: Vascular;  Laterality: Left;   POSTERIOR CERVICAL FUSION/FORAMINOTOMY N/A 07/15/2023   Procedure: POSTERIOR CERVICAL FUSION /Cervical Five -Thoracic one;  Surgeon: Agustina Aldrich, MD;  Location: [facility] OR;  Service: Neurosurgery;  Laterality: N/A;   Current Outpatient Medications on File Prior to Visit  Medication Sig Dispense Refill   acetaminophen  (TYLENOL ) 325 MG tablet Take 1-2 tablets (325-650 mg total) by mouth every 6 (six) hours as needed.     amLODipine  (NORVASC ) 5 MG tablet Take 1 tablet (5 mg total) by mouth daily. 30 tablet 0   aspirin  EC 325 MG tablet Take 325 mg by mouth at bedtime.     atorvastatin  (LIPITOR ) 80 MG tablet Take 1 tablet (80 mg total) by mouth daily. 90 tablet 11   bisacodyl   (DULCOLAX) 10 MG suppository Place 1 suppository (10 mg total) rectally daily as needed for moderate constipation. 4 suppository 1   brimonidine  (ALPHAGAN ) 0.2 % ophthalmic solution Place 1 drop into both eyes 2 (two) times daily.     calcitRIOL  (ROCALTROL ) 0.5 MCG capsule Take 1 capsule (0.5 mcg total) by mouth 3 (three) times a week. (Patient taking differently: Take 0.5 mcg by mouth every Monday, Wednesday, and Friday.) 30 capsule 0   carvedilol  (COREG ) 3.125 MG tablet Take 1 tablet (3.125 mg total) by mouth 2 (two) times daily with a meal. 60 tablet 0   Darbepoetin Alfa  (ARANESP ) 100 MCG/0.5ML SOSY injection Inject 0.5 mLs (100 mcg total) into the skin every Friday at 6 PM. 4.2 mL 0   dorzolamide -timolol  (COSOPT ) 22.3-6.8 MG/ML ophthalmic solution Place 1 drop into both eyes 2 (two) times daily.     lanthanum  (FOSRENOL ) 1000 MG chewable tablet Chew 0.5 tablets (500 mg total) by mouth 3 (three) times daily with meals.     latanoprost  (XALATAN ) 0.005 % ophthalmic solution Place 1 drop into both eyes at bedtime.      lisinopril  (ZESTRIL ) 20 MG tablet Take 20 mg by mouth 2 (two) times daily.     melatonin 5 MG TABS Take 1 tablet (5 mg  total) by mouth at bedtime. 30 tablet 0   polyethylene glycol (MIRALAX  / GLYCOLAX ) 17 g packet Take 17 g by mouth daily as needed for mild constipation.     sulfamethoxazole -trimethoprim  (BACTRIM ) 400-80 MG tablet Take 1 tablet by mouth daily. On dialysis days take after dialysis 30 tablet 0   sulfamethoxazole -trimethoprim  (BACTRIM ) 400-80 MG tablet Take 1 tablet by mouth daily. On HD days take after dialysis 90 tablet 3   No current facility-administered medications on file prior to visit.    No Known Allergies Social History   Occupational History    Employer: UNEMPLOYED  Tobacco Use   Smoking status: Never   Smokeless tobacco: Never  Vaping Use   Vaping status: Never Used  Substance and Sexual Activity   Alcohol  use: No    Alcohol /week: 0.0 standard  drinks of alcohol    Drug use: No   Sexual activity: Not on file   Family History  Problem Relation Age of Onset   Diabetes Mother    Alzheimer's disease Mother    Cancer Father    Heart disease Father    Cancer Sister    Stroke Sister    Immunization History  Administered Date(s) Administered   Hepatitis B, ADULT 11/02/2016   Influenza, Mdck, Trivalent,PF 6+ MOS(egg free) 04/10/2023, 04/22/2023   Influenza, Quadrivalent, Recombinant, Inj, Pf 04/09/2022, 05/02/2022   Influenza,inj,Quad PF,6+ Mos 04/06/2019, 05/04/2020, 04/24/2021   PNEUMOCOCCAL CONJUGATE-20 10/10/2022, 10/19/2022   Pfizer(Comirnaty)Fall Seasonal Vaccine 12 years and older 08/06/2023   Pneumococcal Conjugate-13 04/03/2013, 05/09/2022, 05/16/2022   Pneumococcal Polysaccharide-23 06/06/2016     Review of Systems: Negative except as noted in the HPI.   Objective: There were no vitals filed for this visit.  Ankit Degregorio is a pleasant 63 y.o. male in NAD. AAO X 3.  Diabetic foot exam was performed with the following findings:   Vascular Examination: CFT <4 seconds b/l. DP pulses diminished b/l. PT pulses diminished b/l. Digital hair absent. Skin temperature gradient warm to cool b/l. No ischemia or gangrene. No cyanosis or clubbing noted b/l. No edema noted b/l LE.   Neurological Examination: Protective sensation diminished with 10 gram monofilament b/l lower extremities.  Dermatological Examination: Pedal skin thin, shiny and atrophic b/l. No open wounds. No interdigital macerations.   Toenails 1-5 b/l thick, discolored, elongated with subungual debris and pain on dorsal palpation.   No hyperkeratotic nor porokeratotic lesions present on today's visit.  Musculoskeletal Examination: Muscle strength 5/5 to all lower extremity muscle groups bilaterally. Hammertoe(s) bilateral 2nd toes.  Radiographs: None     Lab Results  Component Value Date   HGBA1C 5.8 (H) 07/09/2023   ADA Risk  Categorization: High Risk  Patient has one or more of the following: Loss of protective sensation Absent pedal pulses Severe Foot deformity History of foot ulcer  Assessment: 1. Pain due to onychomycosis of toenails of both feet   2. Acquired hammertoes of both feet   3. ESRD on dialysis (HCC)   4. Type II diabetes mellitus with peripheral circulatory disorder (HCC)   5. Encounter for diabetic foot exam (HCC)     Plan: Diabetic foot examination performed today. All patient's and/or POA's questions/concerns addressed on today's visit. Toenails 1-5 debrided in length and girth without incident. Continue foot and shoe inspections daily. Monitor blood glucose per PCP/Endocrinologist's recommendations. Continue soft, supportive shoe gear daily. Report any pedal injuries to medical professional. Call office if there are any questions/concerns. -Patient/POA to call should there be question/concern in the interim.  Return in about 9 weeks (around 12/24/2023).  Luella Sager, DPM      [place] LOCATION: [address] [place] [address]                   Office [phone]   [facility] LOCATION: [address] [place], [place] [address] Office [phone]

## 2023-10-23 DIAGNOSIS — N186 End stage renal disease: Secondary | ICD-10-CM | POA: Diagnosis not present

## 2023-10-23 DIAGNOSIS — D509 Iron deficiency anemia, unspecified: Secondary | ICD-10-CM | POA: Diagnosis not present

## 2023-10-23 DIAGNOSIS — E876 Hypokalemia: Secondary | ICD-10-CM | POA: Diagnosis not present

## 2023-10-23 DIAGNOSIS — Z992 Dependence on renal dialysis: Secondary | ICD-10-CM | POA: Diagnosis not present

## 2023-10-23 DIAGNOSIS — N2581 Secondary hyperparathyroidism of renal origin: Secondary | ICD-10-CM | POA: Diagnosis not present

## 2023-10-23 DIAGNOSIS — D631 Anemia in chronic kidney disease: Secondary | ICD-10-CM | POA: Diagnosis not present

## 2023-10-25 DIAGNOSIS — N2581 Secondary hyperparathyroidism of renal origin: Secondary | ICD-10-CM | POA: Diagnosis not present

## 2023-10-25 DIAGNOSIS — N186 End stage renal disease: Secondary | ICD-10-CM | POA: Diagnosis not present

## 2023-10-25 DIAGNOSIS — D509 Iron deficiency anemia, unspecified: Secondary | ICD-10-CM | POA: Diagnosis not present

## 2023-10-25 DIAGNOSIS — Z992 Dependence on renal dialysis: Secondary | ICD-10-CM | POA: Diagnosis not present

## 2023-10-25 DIAGNOSIS — E876 Hypokalemia: Secondary | ICD-10-CM | POA: Diagnosis not present

## 2023-10-25 DIAGNOSIS — D631 Anemia in chronic kidney disease: Secondary | ICD-10-CM | POA: Diagnosis not present

## 2023-10-28 DIAGNOSIS — E876 Hypokalemia: Secondary | ICD-10-CM | POA: Diagnosis not present

## 2023-10-28 DIAGNOSIS — D509 Iron deficiency anemia, unspecified: Secondary | ICD-10-CM | POA: Diagnosis not present

## 2023-10-28 DIAGNOSIS — N2581 Secondary hyperparathyroidism of renal origin: Secondary | ICD-10-CM | POA: Diagnosis not present

## 2023-10-28 DIAGNOSIS — Z992 Dependence on renal dialysis: Secondary | ICD-10-CM | POA: Diagnosis not present

## 2023-10-28 DIAGNOSIS — D631 Anemia in chronic kidney disease: Secondary | ICD-10-CM | POA: Diagnosis not present

## 2023-10-28 DIAGNOSIS — N186 End stage renal disease: Secondary | ICD-10-CM | POA: Diagnosis not present

## 2023-10-30 DIAGNOSIS — N186 End stage renal disease: Secondary | ICD-10-CM | POA: Diagnosis not present

## 2023-10-30 DIAGNOSIS — N2581 Secondary hyperparathyroidism of renal origin: Secondary | ICD-10-CM | POA: Diagnosis not present

## 2023-10-30 DIAGNOSIS — Z992 Dependence on renal dialysis: Secondary | ICD-10-CM | POA: Diagnosis not present

## 2023-10-30 DIAGNOSIS — E876 Hypokalemia: Secondary | ICD-10-CM | POA: Diagnosis not present

## 2023-10-30 DIAGNOSIS — D509 Iron deficiency anemia, unspecified: Secondary | ICD-10-CM | POA: Diagnosis not present

## 2023-10-30 DIAGNOSIS — D631 Anemia in chronic kidney disease: Secondary | ICD-10-CM | POA: Diagnosis not present

## 2023-11-01 DIAGNOSIS — Z992 Dependence on renal dialysis: Secondary | ICD-10-CM | POA: Diagnosis not present

## 2023-11-01 DIAGNOSIS — D631 Anemia in chronic kidney disease: Secondary | ICD-10-CM | POA: Diagnosis not present

## 2023-11-01 DIAGNOSIS — N2581 Secondary hyperparathyroidism of renal origin: Secondary | ICD-10-CM | POA: Diagnosis not present

## 2023-11-01 DIAGNOSIS — N186 End stage renal disease: Secondary | ICD-10-CM | POA: Diagnosis not present

## 2023-11-01 DIAGNOSIS — E876 Hypokalemia: Secondary | ICD-10-CM | POA: Diagnosis not present

## 2023-11-01 DIAGNOSIS — D509 Iron deficiency anemia, unspecified: Secondary | ICD-10-CM | POA: Diagnosis not present

## 2023-11-04 DIAGNOSIS — D631 Anemia in chronic kidney disease: Secondary | ICD-10-CM | POA: Diagnosis not present

## 2023-11-04 DIAGNOSIS — Z992 Dependence on renal dialysis: Secondary | ICD-10-CM | POA: Diagnosis not present

## 2023-11-04 DIAGNOSIS — E876 Hypokalemia: Secondary | ICD-10-CM | POA: Diagnosis not present

## 2023-11-04 DIAGNOSIS — N186 End stage renal disease: Secondary | ICD-10-CM | POA: Diagnosis not present

## 2023-11-04 DIAGNOSIS — D509 Iron deficiency anemia, unspecified: Secondary | ICD-10-CM | POA: Diagnosis not present

## 2023-11-04 DIAGNOSIS — N2581 Secondary hyperparathyroidism of renal origin: Secondary | ICD-10-CM | POA: Diagnosis not present

## 2023-11-06 DIAGNOSIS — N186 End stage renal disease: Secondary | ICD-10-CM | POA: Diagnosis not present

## 2023-11-06 DIAGNOSIS — E876 Hypokalemia: Secondary | ICD-10-CM | POA: Diagnosis not present

## 2023-11-06 DIAGNOSIS — D631 Anemia in chronic kidney disease: Secondary | ICD-10-CM | POA: Diagnosis not present

## 2023-11-06 DIAGNOSIS — N2581 Secondary hyperparathyroidism of renal origin: Secondary | ICD-10-CM | POA: Diagnosis not present

## 2023-11-06 DIAGNOSIS — D509 Iron deficiency anemia, unspecified: Secondary | ICD-10-CM | POA: Diagnosis not present

## 2023-11-06 DIAGNOSIS — Z992 Dependence on renal dialysis: Secondary | ICD-10-CM | POA: Diagnosis not present

## 2023-11-07 DIAGNOSIS — G992 Myelopathy in diseases classified elsewhere: Secondary | ICD-10-CM | POA: Diagnosis not present

## 2023-11-07 DIAGNOSIS — I120 Hypertensive chronic kidney disease with stage 5 chronic kidney disease or end stage renal disease: Secondary | ICD-10-CM | POA: Diagnosis not present

## 2023-11-07 DIAGNOSIS — M4802 Spinal stenosis, cervical region: Secondary | ICD-10-CM | POA: Diagnosis not present

## 2023-11-07 DIAGNOSIS — Z992 Dependence on renal dialysis: Secondary | ICD-10-CM | POA: Diagnosis not present

## 2023-11-07 DIAGNOSIS — N186 End stage renal disease: Secondary | ICD-10-CM | POA: Diagnosis not present

## 2023-11-08 DIAGNOSIS — N186 End stage renal disease: Secondary | ICD-10-CM | POA: Diagnosis not present

## 2023-11-08 DIAGNOSIS — E119 Type 2 diabetes mellitus without complications: Secondary | ICD-10-CM | POA: Diagnosis not present

## 2023-11-08 DIAGNOSIS — D631 Anemia in chronic kidney disease: Secondary | ICD-10-CM | POA: Diagnosis not present

## 2023-11-08 DIAGNOSIS — D509 Iron deficiency anemia, unspecified: Secondary | ICD-10-CM | POA: Diagnosis not present

## 2023-11-08 DIAGNOSIS — E876 Hypokalemia: Secondary | ICD-10-CM | POA: Diagnosis not present

## 2023-11-08 DIAGNOSIS — Z23 Encounter for immunization: Secondary | ICD-10-CM | POA: Diagnosis not present

## 2023-11-08 DIAGNOSIS — Z992 Dependence on renal dialysis: Secondary | ICD-10-CM | POA: Diagnosis not present

## 2023-11-08 DIAGNOSIS — N2581 Secondary hyperparathyroidism of renal origin: Secondary | ICD-10-CM | POA: Diagnosis not present

## 2023-11-11 DIAGNOSIS — N186 End stage renal disease: Secondary | ICD-10-CM | POA: Diagnosis not present

## 2023-11-11 DIAGNOSIS — D631 Anemia in chronic kidney disease: Secondary | ICD-10-CM | POA: Diagnosis not present

## 2023-11-11 DIAGNOSIS — N2581 Secondary hyperparathyroidism of renal origin: Secondary | ICD-10-CM | POA: Diagnosis not present

## 2023-11-11 DIAGNOSIS — E876 Hypokalemia: Secondary | ICD-10-CM | POA: Diagnosis not present

## 2023-11-11 DIAGNOSIS — D509 Iron deficiency anemia, unspecified: Secondary | ICD-10-CM | POA: Diagnosis not present

## 2023-11-11 DIAGNOSIS — Z992 Dependence on renal dialysis: Secondary | ICD-10-CM | POA: Diagnosis not present

## 2023-11-13 DIAGNOSIS — N186 End stage renal disease: Secondary | ICD-10-CM | POA: Diagnosis not present

## 2023-11-13 DIAGNOSIS — N2581 Secondary hyperparathyroidism of renal origin: Secondary | ICD-10-CM | POA: Diagnosis not present

## 2023-11-13 DIAGNOSIS — E876 Hypokalemia: Secondary | ICD-10-CM | POA: Diagnosis not present

## 2023-11-13 DIAGNOSIS — D631 Anemia in chronic kidney disease: Secondary | ICD-10-CM | POA: Diagnosis not present

## 2023-11-13 DIAGNOSIS — Z992 Dependence on renal dialysis: Secondary | ICD-10-CM | POA: Diagnosis not present

## 2023-11-13 DIAGNOSIS — D509 Iron deficiency anemia, unspecified: Secondary | ICD-10-CM | POA: Diagnosis not present

## 2023-11-15 DIAGNOSIS — Z992 Dependence on renal dialysis: Secondary | ICD-10-CM | POA: Diagnosis not present

## 2023-11-15 DIAGNOSIS — N186 End stage renal disease: Secondary | ICD-10-CM | POA: Diagnosis not present

## 2023-11-15 DIAGNOSIS — D509 Iron deficiency anemia, unspecified: Secondary | ICD-10-CM | POA: Diagnosis not present

## 2023-11-15 DIAGNOSIS — D631 Anemia in chronic kidney disease: Secondary | ICD-10-CM | POA: Diagnosis not present

## 2023-11-15 DIAGNOSIS — N2581 Secondary hyperparathyroidism of renal origin: Secondary | ICD-10-CM | POA: Diagnosis not present

## 2023-11-15 DIAGNOSIS — E876 Hypokalemia: Secondary | ICD-10-CM | POA: Diagnosis not present

## 2023-11-18 DIAGNOSIS — E876 Hypokalemia: Secondary | ICD-10-CM | POA: Diagnosis not present

## 2023-11-18 DIAGNOSIS — N186 End stage renal disease: Secondary | ICD-10-CM | POA: Diagnosis not present

## 2023-11-18 DIAGNOSIS — D509 Iron deficiency anemia, unspecified: Secondary | ICD-10-CM | POA: Diagnosis not present

## 2023-11-18 DIAGNOSIS — D631 Anemia in chronic kidney disease: Secondary | ICD-10-CM | POA: Diagnosis not present

## 2023-11-18 DIAGNOSIS — Z992 Dependence on renal dialysis: Secondary | ICD-10-CM | POA: Diagnosis not present

## 2023-11-18 DIAGNOSIS — N2581 Secondary hyperparathyroidism of renal origin: Secondary | ICD-10-CM | POA: Diagnosis not present

## 2023-11-20 DIAGNOSIS — Z992 Dependence on renal dialysis: Secondary | ICD-10-CM | POA: Diagnosis not present

## 2023-11-20 DIAGNOSIS — D631 Anemia in chronic kidney disease: Secondary | ICD-10-CM | POA: Diagnosis not present

## 2023-11-20 DIAGNOSIS — N2581 Secondary hyperparathyroidism of renal origin: Secondary | ICD-10-CM | POA: Diagnosis not present

## 2023-11-20 DIAGNOSIS — D509 Iron deficiency anemia, unspecified: Secondary | ICD-10-CM | POA: Diagnosis not present

## 2023-11-20 DIAGNOSIS — N186 End stage renal disease: Secondary | ICD-10-CM | POA: Diagnosis not present

## 2023-11-20 DIAGNOSIS — E876 Hypokalemia: Secondary | ICD-10-CM | POA: Diagnosis not present

## 2023-11-22 DIAGNOSIS — Z992 Dependence on renal dialysis: Secondary | ICD-10-CM | POA: Diagnosis not present

## 2023-11-22 DIAGNOSIS — N2581 Secondary hyperparathyroidism of renal origin: Secondary | ICD-10-CM | POA: Diagnosis not present

## 2023-11-22 DIAGNOSIS — E876 Hypokalemia: Secondary | ICD-10-CM | POA: Diagnosis not present

## 2023-11-22 DIAGNOSIS — D509 Iron deficiency anemia, unspecified: Secondary | ICD-10-CM | POA: Diagnosis not present

## 2023-11-22 DIAGNOSIS — D631 Anemia in chronic kidney disease: Secondary | ICD-10-CM | POA: Diagnosis not present

## 2023-11-22 DIAGNOSIS — N186 End stage renal disease: Secondary | ICD-10-CM | POA: Diagnosis not present

## 2023-11-25 DIAGNOSIS — D509 Iron deficiency anemia, unspecified: Secondary | ICD-10-CM | POA: Diagnosis not present

## 2023-11-25 DIAGNOSIS — Z992 Dependence on renal dialysis: Secondary | ICD-10-CM | POA: Diagnosis not present

## 2023-11-25 DIAGNOSIS — N2581 Secondary hyperparathyroidism of renal origin: Secondary | ICD-10-CM | POA: Diagnosis not present

## 2023-11-25 DIAGNOSIS — D631 Anemia in chronic kidney disease: Secondary | ICD-10-CM | POA: Diagnosis not present

## 2023-11-25 DIAGNOSIS — E876 Hypokalemia: Secondary | ICD-10-CM | POA: Diagnosis not present

## 2023-11-25 DIAGNOSIS — N186 End stage renal disease: Secondary | ICD-10-CM | POA: Diagnosis not present

## 2023-11-27 DIAGNOSIS — Z992 Dependence on renal dialysis: Secondary | ICD-10-CM | POA: Diagnosis not present

## 2023-11-27 DIAGNOSIS — D631 Anemia in chronic kidney disease: Secondary | ICD-10-CM | POA: Diagnosis not present

## 2023-11-27 DIAGNOSIS — N186 End stage renal disease: Secondary | ICD-10-CM | POA: Diagnosis not present

## 2023-11-27 DIAGNOSIS — E1129 Type 2 diabetes mellitus with other diabetic kidney complication: Secondary | ICD-10-CM | POA: Diagnosis not present

## 2023-11-27 DIAGNOSIS — N2581 Secondary hyperparathyroidism of renal origin: Secondary | ICD-10-CM | POA: Diagnosis not present

## 2023-11-27 DIAGNOSIS — E876 Hypokalemia: Secondary | ICD-10-CM | POA: Diagnosis not present

## 2023-11-27 DIAGNOSIS — D509 Iron deficiency anemia, unspecified: Secondary | ICD-10-CM | POA: Diagnosis not present

## 2023-11-29 DIAGNOSIS — D509 Iron deficiency anemia, unspecified: Secondary | ICD-10-CM | POA: Diagnosis not present

## 2023-11-29 DIAGNOSIS — Z992 Dependence on renal dialysis: Secondary | ICD-10-CM | POA: Diagnosis not present

## 2023-11-29 DIAGNOSIS — N186 End stage renal disease: Secondary | ICD-10-CM | POA: Diagnosis not present

## 2023-11-29 DIAGNOSIS — N2581 Secondary hyperparathyroidism of renal origin: Secondary | ICD-10-CM | POA: Diagnosis not present

## 2023-11-29 DIAGNOSIS — E876 Hypokalemia: Secondary | ICD-10-CM | POA: Diagnosis not present

## 2023-11-29 DIAGNOSIS — D631 Anemia in chronic kidney disease: Secondary | ICD-10-CM | POA: Diagnosis not present

## 2023-12-02 DIAGNOSIS — E876 Hypokalemia: Secondary | ICD-10-CM | POA: Diagnosis not present

## 2023-12-02 DIAGNOSIS — Z992 Dependence on renal dialysis: Secondary | ICD-10-CM | POA: Diagnosis not present

## 2023-12-02 DIAGNOSIS — D509 Iron deficiency anemia, unspecified: Secondary | ICD-10-CM | POA: Diagnosis not present

## 2023-12-02 DIAGNOSIS — N2581 Secondary hyperparathyroidism of renal origin: Secondary | ICD-10-CM | POA: Diagnosis not present

## 2023-12-02 DIAGNOSIS — N186 End stage renal disease: Secondary | ICD-10-CM | POA: Diagnosis not present

## 2023-12-02 DIAGNOSIS — D631 Anemia in chronic kidney disease: Secondary | ICD-10-CM | POA: Diagnosis not present

## 2023-12-04 DIAGNOSIS — N186 End stage renal disease: Secondary | ICD-10-CM | POA: Diagnosis not present

## 2023-12-04 DIAGNOSIS — D509 Iron deficiency anemia, unspecified: Secondary | ICD-10-CM | POA: Diagnosis not present

## 2023-12-04 DIAGNOSIS — D631 Anemia in chronic kidney disease: Secondary | ICD-10-CM | POA: Diagnosis not present

## 2023-12-04 DIAGNOSIS — Z992 Dependence on renal dialysis: Secondary | ICD-10-CM | POA: Diagnosis not present

## 2023-12-04 DIAGNOSIS — N2581 Secondary hyperparathyroidism of renal origin: Secondary | ICD-10-CM | POA: Diagnosis not present

## 2023-12-04 DIAGNOSIS — E876 Hypokalemia: Secondary | ICD-10-CM | POA: Diagnosis not present

## 2023-12-06 DIAGNOSIS — N2581 Secondary hyperparathyroidism of renal origin: Secondary | ICD-10-CM | POA: Diagnosis not present

## 2023-12-06 DIAGNOSIS — N186 End stage renal disease: Secondary | ICD-10-CM | POA: Diagnosis not present

## 2023-12-06 DIAGNOSIS — D631 Anemia in chronic kidney disease: Secondary | ICD-10-CM | POA: Diagnosis not present

## 2023-12-06 DIAGNOSIS — Z992 Dependence on renal dialysis: Secondary | ICD-10-CM | POA: Diagnosis not present

## 2023-12-06 DIAGNOSIS — D509 Iron deficiency anemia, unspecified: Secondary | ICD-10-CM | POA: Diagnosis not present

## 2023-12-06 DIAGNOSIS — E876 Hypokalemia: Secondary | ICD-10-CM | POA: Diagnosis not present

## 2023-12-08 DIAGNOSIS — I120 Hypertensive chronic kidney disease with stage 5 chronic kidney disease or end stage renal disease: Secondary | ICD-10-CM | POA: Diagnosis not present

## 2023-12-08 DIAGNOSIS — N186 End stage renal disease: Secondary | ICD-10-CM | POA: Diagnosis not present

## 2023-12-08 DIAGNOSIS — Z992 Dependence on renal dialysis: Secondary | ICD-10-CM | POA: Diagnosis not present

## 2023-12-09 DIAGNOSIS — N186 End stage renal disease: Secondary | ICD-10-CM | POA: Diagnosis not present

## 2023-12-09 DIAGNOSIS — E876 Hypokalemia: Secondary | ICD-10-CM | POA: Diagnosis not present

## 2023-12-09 DIAGNOSIS — E119 Type 2 diabetes mellitus without complications: Secondary | ICD-10-CM | POA: Diagnosis not present

## 2023-12-09 DIAGNOSIS — D631 Anemia in chronic kidney disease: Secondary | ICD-10-CM | POA: Diagnosis not present

## 2023-12-09 DIAGNOSIS — Z992 Dependence on renal dialysis: Secondary | ICD-10-CM | POA: Diagnosis not present

## 2023-12-09 DIAGNOSIS — N2581 Secondary hyperparathyroidism of renal origin: Secondary | ICD-10-CM | POA: Diagnosis not present

## 2023-12-09 DIAGNOSIS — D509 Iron deficiency anemia, unspecified: Secondary | ICD-10-CM | POA: Diagnosis not present

## 2023-12-11 DIAGNOSIS — N2581 Secondary hyperparathyroidism of renal origin: Secondary | ICD-10-CM | POA: Diagnosis not present

## 2023-12-11 DIAGNOSIS — E876 Hypokalemia: Secondary | ICD-10-CM | POA: Diagnosis not present

## 2023-12-11 DIAGNOSIS — Z992 Dependence on renal dialysis: Secondary | ICD-10-CM | POA: Diagnosis not present

## 2023-12-11 DIAGNOSIS — D509 Iron deficiency anemia, unspecified: Secondary | ICD-10-CM | POA: Diagnosis not present

## 2023-12-11 DIAGNOSIS — N186 End stage renal disease: Secondary | ICD-10-CM | POA: Diagnosis not present

## 2023-12-11 DIAGNOSIS — D631 Anemia in chronic kidney disease: Secondary | ICD-10-CM | POA: Diagnosis not present

## 2023-12-13 DIAGNOSIS — N186 End stage renal disease: Secondary | ICD-10-CM | POA: Diagnosis not present

## 2023-12-13 DIAGNOSIS — N2581 Secondary hyperparathyroidism of renal origin: Secondary | ICD-10-CM | POA: Diagnosis not present

## 2023-12-13 DIAGNOSIS — D509 Iron deficiency anemia, unspecified: Secondary | ICD-10-CM | POA: Diagnosis not present

## 2023-12-13 DIAGNOSIS — Z992 Dependence on renal dialysis: Secondary | ICD-10-CM | POA: Diagnosis not present

## 2023-12-13 DIAGNOSIS — D631 Anemia in chronic kidney disease: Secondary | ICD-10-CM | POA: Diagnosis not present

## 2023-12-13 DIAGNOSIS — E876 Hypokalemia: Secondary | ICD-10-CM | POA: Diagnosis not present

## 2023-12-16 DIAGNOSIS — Z992 Dependence on renal dialysis: Secondary | ICD-10-CM | POA: Diagnosis not present

## 2023-12-16 DIAGNOSIS — D509 Iron deficiency anemia, unspecified: Secondary | ICD-10-CM | POA: Diagnosis not present

## 2023-12-16 DIAGNOSIS — N186 End stage renal disease: Secondary | ICD-10-CM | POA: Diagnosis not present

## 2023-12-16 DIAGNOSIS — D631 Anemia in chronic kidney disease: Secondary | ICD-10-CM | POA: Diagnosis not present

## 2023-12-16 DIAGNOSIS — N2581 Secondary hyperparathyroidism of renal origin: Secondary | ICD-10-CM | POA: Diagnosis not present

## 2023-12-16 DIAGNOSIS — E876 Hypokalemia: Secondary | ICD-10-CM | POA: Diagnosis not present

## 2023-12-18 DIAGNOSIS — D631 Anemia in chronic kidney disease: Secondary | ICD-10-CM | POA: Diagnosis not present

## 2023-12-18 DIAGNOSIS — N2581 Secondary hyperparathyroidism of renal origin: Secondary | ICD-10-CM | POA: Diagnosis not present

## 2023-12-18 DIAGNOSIS — N186 End stage renal disease: Secondary | ICD-10-CM | POA: Diagnosis not present

## 2023-12-18 DIAGNOSIS — Z992 Dependence on renal dialysis: Secondary | ICD-10-CM | POA: Diagnosis not present

## 2023-12-18 DIAGNOSIS — E876 Hypokalemia: Secondary | ICD-10-CM | POA: Diagnosis not present

## 2023-12-18 DIAGNOSIS — D509 Iron deficiency anemia, unspecified: Secondary | ICD-10-CM | POA: Diagnosis not present

## 2023-12-20 DIAGNOSIS — D631 Anemia in chronic kidney disease: Secondary | ICD-10-CM | POA: Diagnosis not present

## 2023-12-20 DIAGNOSIS — N2581 Secondary hyperparathyroidism of renal origin: Secondary | ICD-10-CM | POA: Diagnosis not present

## 2023-12-20 DIAGNOSIS — Z992 Dependence on renal dialysis: Secondary | ICD-10-CM | POA: Diagnosis not present

## 2023-12-20 DIAGNOSIS — D509 Iron deficiency anemia, unspecified: Secondary | ICD-10-CM | POA: Diagnosis not present

## 2023-12-20 DIAGNOSIS — E876 Hypokalemia: Secondary | ICD-10-CM | POA: Diagnosis not present

## 2023-12-20 DIAGNOSIS — N186 End stage renal disease: Secondary | ICD-10-CM | POA: Diagnosis not present

## 2023-12-24 ENCOUNTER — Ambulatory Visit (INDEPENDENT_AMBULATORY_CARE_PROVIDER_SITE_OTHER): Payer: Medicare Other | Admitting: Podiatry

## 2023-12-24 ENCOUNTER — Encounter: Payer: Self-pay | Admitting: Podiatry

## 2023-12-24 VITALS — Ht 72.0 in | Wt 146.0 lb

## 2023-12-24 DIAGNOSIS — B351 Tinea unguium: Secondary | ICD-10-CM | POA: Diagnosis not present

## 2023-12-24 DIAGNOSIS — N186 End stage renal disease: Secondary | ICD-10-CM

## 2023-12-24 DIAGNOSIS — M79674 Pain in right toe(s): Secondary | ICD-10-CM

## 2023-12-24 DIAGNOSIS — Z992 Dependence on renal dialysis: Secondary | ICD-10-CM | POA: Diagnosis not present

## 2023-12-24 DIAGNOSIS — M79675 Pain in left toe(s): Secondary | ICD-10-CM

## 2023-12-24 DIAGNOSIS — E1151 Type 2 diabetes mellitus with diabetic peripheral angiopathy without gangrene: Secondary | ICD-10-CM | POA: Diagnosis not present

## 2023-12-25 DIAGNOSIS — D631 Anemia in chronic kidney disease: Secondary | ICD-10-CM | POA: Diagnosis not present

## 2023-12-25 DIAGNOSIS — Z992 Dependence on renal dialysis: Secondary | ICD-10-CM | POA: Diagnosis not present

## 2023-12-25 DIAGNOSIS — N186 End stage renal disease: Secondary | ICD-10-CM | POA: Diagnosis not present

## 2023-12-25 DIAGNOSIS — N2581 Secondary hyperparathyroidism of renal origin: Secondary | ICD-10-CM | POA: Diagnosis not present

## 2023-12-25 DIAGNOSIS — E876 Hypokalemia: Secondary | ICD-10-CM | POA: Diagnosis not present

## 2023-12-25 DIAGNOSIS — D509 Iron deficiency anemia, unspecified: Secondary | ICD-10-CM | POA: Diagnosis not present

## 2023-12-27 DIAGNOSIS — N2581 Secondary hyperparathyroidism of renal origin: Secondary | ICD-10-CM | POA: Diagnosis not present

## 2023-12-27 DIAGNOSIS — Z992 Dependence on renal dialysis: Secondary | ICD-10-CM | POA: Diagnosis not present

## 2023-12-27 DIAGNOSIS — D509 Iron deficiency anemia, unspecified: Secondary | ICD-10-CM | POA: Diagnosis not present

## 2023-12-27 DIAGNOSIS — E876 Hypokalemia: Secondary | ICD-10-CM | POA: Diagnosis not present

## 2023-12-27 DIAGNOSIS — D631 Anemia in chronic kidney disease: Secondary | ICD-10-CM | POA: Diagnosis not present

## 2023-12-27 DIAGNOSIS — N186 End stage renal disease: Secondary | ICD-10-CM | POA: Diagnosis not present

## 2023-12-29 NOTE — Progress Notes (Signed)
  Subjective:  Patient ID: Shawn [name], male    DOB: 1961-03-27,  MRN: [id number]  Shawn [name] presents to clinic today for at risk foot care with history of diabetic neuropathy and painful mycotic toenails of both feet that are difficult to trim. Pain interferes with daily activities and wearing enclosed shoe gear comfortably.  Chief Complaint  Patient presents with   Nail Problem    Pt is here for [facility] last A1C was 6.3 PCP is D Kilpatrick and LOV was in March.   New problem(s): None.   PCP is Hillman Bare, MD.  No Known Allergies  Review of Systems: Negative except as noted in the HPI.  Objective: No changes noted in today's physical examination. There were no vitals filed for this visit. Shawn [name] is a pleasant 63 y.o. male thin build in NAD. AAO x 3.  Vascular Examination: CFT <4 seconds b/l. Nonpalpable pedal pulses b/l.  Digital hair absent. Skin temperature gradient warm to cool b/l. No ischemia or gangrene. No cyanosis or clubbing noted b/l. No edema noted b/l LE.   Neurological Examination: Protective sensation diminished with 10g monofilament b/l.  Dermatological Examination: Pedal skin thin, shiny and atrophic b/l. No open wounds. No interdigital macerations.   Toenails 1-5 b/l thick, discolored, elongated with subungual debris and pain on dorsal palpation.   No corns, calluses nor porokeratotic lesions noted.  Musculoskeletal Examination: Muscle strength 5/5 to all lower extremity muscle groups bilaterally. Hammertoe(s) L 2nd toe and R 2nd toe.  Radiographs: Non Last A1c:      Latest Ref Rng & Units 07/09/2023    3:23 PM  Hemoglobin A1C  Hemoglobin-A1c 4.8 - 5.6 % 5.8     Assessment/Plan: 1. Pain due to onychomycosis of toenails of both feet   2. ESRD on dialysis (HCC)   3. Type II diabetes mellitus with peripheral circulatory disorder [facility])   -Patient's family member present. All questions/concerns addressed on today's  visit. -Examined patient. -Patient to continue soft, supportive shoe gear daily. -Toenails 2-5 bilaterally debrided in length and girth without iatrogenic bleeding with sterile nail nipper and dremel.  -No invasive procedure(s) performed. Offending nail border debrided and curretaged bilateral great toes utilizing sterile nail nipper and currette. Border(s) cleansed with alcohol  and triple antibiotic ointment applied. Patient/POA/Caregiver/Facility instructed to apply Neosporin Cream  to bilateral great toes once daily for 7 days. Call office if there are any concerns. -Patient/POA to call should there be question/concern in the interim.   Return in about 9 weeks (around 02/25/2024).  Shawn [name], DPM      [place] LOCATION: [address] [place] [address]                   Office [phone]   [name] LOCATION: [address] [place], [place] [address] Office [phone]

## 2023-12-30 DIAGNOSIS — D509 Iron deficiency anemia, unspecified: Secondary | ICD-10-CM | POA: Diagnosis not present

## 2023-12-30 DIAGNOSIS — N2581 Secondary hyperparathyroidism of renal origin: Secondary | ICD-10-CM | POA: Diagnosis not present

## 2023-12-30 DIAGNOSIS — D631 Anemia in chronic kidney disease: Secondary | ICD-10-CM | POA: Diagnosis not present

## 2023-12-30 DIAGNOSIS — Z992 Dependence on renal dialysis: Secondary | ICD-10-CM | POA: Diagnosis not present

## 2023-12-30 DIAGNOSIS — N186 End stage renal disease: Secondary | ICD-10-CM | POA: Diagnosis not present

## 2023-12-30 DIAGNOSIS — E876 Hypokalemia: Secondary | ICD-10-CM | POA: Diagnosis not present

## 2024-01-01 DIAGNOSIS — D631 Anemia in chronic kidney disease: Secondary | ICD-10-CM | POA: Diagnosis not present

## 2024-01-01 DIAGNOSIS — Z992 Dependence on renal dialysis: Secondary | ICD-10-CM | POA: Diagnosis not present

## 2024-01-01 DIAGNOSIS — E876 Hypokalemia: Secondary | ICD-10-CM | POA: Diagnosis not present

## 2024-01-01 DIAGNOSIS — N2581 Secondary hyperparathyroidism of renal origin: Secondary | ICD-10-CM | POA: Diagnosis not present

## 2024-01-01 DIAGNOSIS — D509 Iron deficiency anemia, unspecified: Secondary | ICD-10-CM | POA: Diagnosis not present

## 2024-01-01 DIAGNOSIS — N186 End stage renal disease: Secondary | ICD-10-CM | POA: Diagnosis not present

## 2024-01-03 DIAGNOSIS — N2581 Secondary hyperparathyroidism of renal origin: Secondary | ICD-10-CM | POA: Diagnosis not present

## 2024-01-03 DIAGNOSIS — D509 Iron deficiency anemia, unspecified: Secondary | ICD-10-CM | POA: Diagnosis not present

## 2024-01-03 DIAGNOSIS — D631 Anemia in chronic kidney disease: Secondary | ICD-10-CM | POA: Diagnosis not present

## 2024-01-03 DIAGNOSIS — E876 Hypokalemia: Secondary | ICD-10-CM | POA: Diagnosis not present

## 2024-01-03 DIAGNOSIS — Z992 Dependence on renal dialysis: Secondary | ICD-10-CM | POA: Diagnosis not present

## 2024-01-03 DIAGNOSIS — N186 End stage renal disease: Secondary | ICD-10-CM | POA: Diagnosis not present

## 2024-01-06 DIAGNOSIS — N2581 Secondary hyperparathyroidism of renal origin: Secondary | ICD-10-CM | POA: Diagnosis not present

## 2024-01-06 DIAGNOSIS — N186 End stage renal disease: Secondary | ICD-10-CM | POA: Diagnosis not present

## 2024-01-06 DIAGNOSIS — D509 Iron deficiency anemia, unspecified: Secondary | ICD-10-CM | POA: Diagnosis not present

## 2024-01-06 DIAGNOSIS — Z992 Dependence on renal dialysis: Secondary | ICD-10-CM | POA: Diagnosis not present

## 2024-01-06 DIAGNOSIS — E876 Hypokalemia: Secondary | ICD-10-CM | POA: Diagnosis not present

## 2024-01-06 DIAGNOSIS — D631 Anemia in chronic kidney disease: Secondary | ICD-10-CM | POA: Diagnosis not present

## 2024-01-07 DIAGNOSIS — I120 Hypertensive chronic kidney disease with stage 5 chronic kidney disease or end stage renal disease: Secondary | ICD-10-CM | POA: Diagnosis not present

## 2024-01-07 DIAGNOSIS — Z992 Dependence on renal dialysis: Secondary | ICD-10-CM | POA: Diagnosis not present

## 2024-01-07 DIAGNOSIS — N186 End stage renal disease: Secondary | ICD-10-CM | POA: Diagnosis not present

## 2024-01-08 DIAGNOSIS — N2581 Secondary hyperparathyroidism of renal origin: Secondary | ICD-10-CM | POA: Diagnosis not present

## 2024-01-08 DIAGNOSIS — Z992 Dependence on renal dialysis: Secondary | ICD-10-CM | POA: Diagnosis not present

## 2024-01-08 DIAGNOSIS — D509 Iron deficiency anemia, unspecified: Secondary | ICD-10-CM | POA: Diagnosis not present

## 2024-01-08 DIAGNOSIS — E119 Type 2 diabetes mellitus without complications: Secondary | ICD-10-CM | POA: Diagnosis not present

## 2024-01-08 DIAGNOSIS — E876 Hypokalemia: Secondary | ICD-10-CM | POA: Diagnosis not present

## 2024-01-08 DIAGNOSIS — D631 Anemia in chronic kidney disease: Secondary | ICD-10-CM | POA: Diagnosis not present

## 2024-01-08 DIAGNOSIS — N186 End stage renal disease: Secondary | ICD-10-CM | POA: Diagnosis not present

## 2024-01-10 DIAGNOSIS — D509 Iron deficiency anemia, unspecified: Secondary | ICD-10-CM | POA: Diagnosis not present

## 2024-01-10 DIAGNOSIS — E876 Hypokalemia: Secondary | ICD-10-CM | POA: Diagnosis not present

## 2024-01-10 DIAGNOSIS — N186 End stage renal disease: Secondary | ICD-10-CM | POA: Diagnosis not present

## 2024-01-10 DIAGNOSIS — Z992 Dependence on renal dialysis: Secondary | ICD-10-CM | POA: Diagnosis not present

## 2024-01-10 DIAGNOSIS — D631 Anemia in chronic kidney disease: Secondary | ICD-10-CM | POA: Diagnosis not present

## 2024-01-10 DIAGNOSIS — N2581 Secondary hyperparathyroidism of renal origin: Secondary | ICD-10-CM | POA: Diagnosis not present

## 2024-01-13 DIAGNOSIS — D631 Anemia in chronic kidney disease: Secondary | ICD-10-CM | POA: Diagnosis not present

## 2024-01-13 DIAGNOSIS — N186 End stage renal disease: Secondary | ICD-10-CM | POA: Diagnosis not present

## 2024-01-13 DIAGNOSIS — N2581 Secondary hyperparathyroidism of renal origin: Secondary | ICD-10-CM | POA: Diagnosis not present

## 2024-01-13 DIAGNOSIS — D509 Iron deficiency anemia, unspecified: Secondary | ICD-10-CM | POA: Diagnosis not present

## 2024-01-13 DIAGNOSIS — E876 Hypokalemia: Secondary | ICD-10-CM | POA: Diagnosis not present

## 2024-01-13 DIAGNOSIS — Z992 Dependence on renal dialysis: Secondary | ICD-10-CM | POA: Diagnosis not present

## 2024-01-15 DIAGNOSIS — Z992 Dependence on renal dialysis: Secondary | ICD-10-CM | POA: Diagnosis not present

## 2024-01-15 DIAGNOSIS — N2581 Secondary hyperparathyroidism of renal origin: Secondary | ICD-10-CM | POA: Diagnosis not present

## 2024-01-15 DIAGNOSIS — D509 Iron deficiency anemia, unspecified: Secondary | ICD-10-CM | POA: Diagnosis not present

## 2024-01-15 DIAGNOSIS — D631 Anemia in chronic kidney disease: Secondary | ICD-10-CM | POA: Diagnosis not present

## 2024-01-15 DIAGNOSIS — E876 Hypokalemia: Secondary | ICD-10-CM | POA: Diagnosis not present

## 2024-01-15 DIAGNOSIS — N186 End stage renal disease: Secondary | ICD-10-CM | POA: Diagnosis not present

## 2024-01-16 DIAGNOSIS — M4802 Spinal stenosis, cervical region: Secondary | ICD-10-CM | POA: Diagnosis not present

## 2024-01-17 DIAGNOSIS — N2581 Secondary hyperparathyroidism of renal origin: Secondary | ICD-10-CM | POA: Diagnosis not present

## 2024-01-17 DIAGNOSIS — N186 End stage renal disease: Secondary | ICD-10-CM | POA: Diagnosis not present

## 2024-01-17 DIAGNOSIS — Z992 Dependence on renal dialysis: Secondary | ICD-10-CM | POA: Diagnosis not present

## 2024-01-17 DIAGNOSIS — D631 Anemia in chronic kidney disease: Secondary | ICD-10-CM | POA: Diagnosis not present

## 2024-01-17 DIAGNOSIS — D509 Iron deficiency anemia, unspecified: Secondary | ICD-10-CM | POA: Diagnosis not present

## 2024-01-17 DIAGNOSIS — E876 Hypokalemia: Secondary | ICD-10-CM | POA: Diagnosis not present

## 2024-01-20 DIAGNOSIS — N2581 Secondary hyperparathyroidism of renal origin: Secondary | ICD-10-CM | POA: Diagnosis not present

## 2024-01-20 DIAGNOSIS — D509 Iron deficiency anemia, unspecified: Secondary | ICD-10-CM | POA: Diagnosis not present

## 2024-01-20 DIAGNOSIS — D631 Anemia in chronic kidney disease: Secondary | ICD-10-CM | POA: Diagnosis not present

## 2024-01-20 DIAGNOSIS — Z992 Dependence on renal dialysis: Secondary | ICD-10-CM | POA: Diagnosis not present

## 2024-01-20 DIAGNOSIS — E876 Hypokalemia: Secondary | ICD-10-CM | POA: Diagnosis not present

## 2024-01-20 DIAGNOSIS — N186 End stage renal disease: Secondary | ICD-10-CM | POA: Diagnosis not present

## 2024-01-22 DIAGNOSIS — N2581 Secondary hyperparathyroidism of renal origin: Secondary | ICD-10-CM | POA: Diagnosis not present

## 2024-01-22 DIAGNOSIS — N186 End stage renal disease: Secondary | ICD-10-CM | POA: Diagnosis not present

## 2024-01-22 DIAGNOSIS — Z992 Dependence on renal dialysis: Secondary | ICD-10-CM | POA: Diagnosis not present

## 2024-01-22 DIAGNOSIS — D509 Iron deficiency anemia, unspecified: Secondary | ICD-10-CM | POA: Diagnosis not present

## 2024-01-22 DIAGNOSIS — D631 Anemia in chronic kidney disease: Secondary | ICD-10-CM | POA: Diagnosis not present

## 2024-01-22 DIAGNOSIS — E876 Hypokalemia: Secondary | ICD-10-CM | POA: Diagnosis not present

## 2024-01-24 DIAGNOSIS — D631 Anemia in chronic kidney disease: Secondary | ICD-10-CM | POA: Diagnosis not present

## 2024-01-24 DIAGNOSIS — D509 Iron deficiency anemia, unspecified: Secondary | ICD-10-CM | POA: Diagnosis not present

## 2024-01-24 DIAGNOSIS — Z992 Dependence on renal dialysis: Secondary | ICD-10-CM | POA: Diagnosis not present

## 2024-01-24 DIAGNOSIS — N186 End stage renal disease: Secondary | ICD-10-CM | POA: Diagnosis not present

## 2024-01-24 DIAGNOSIS — E876 Hypokalemia: Secondary | ICD-10-CM | POA: Diagnosis not present

## 2024-01-24 DIAGNOSIS — N2581 Secondary hyperparathyroidism of renal origin: Secondary | ICD-10-CM | POA: Diagnosis not present

## 2024-01-27 DIAGNOSIS — D509 Iron deficiency anemia, unspecified: Secondary | ICD-10-CM | POA: Diagnosis not present

## 2024-01-27 DIAGNOSIS — E876 Hypokalemia: Secondary | ICD-10-CM | POA: Diagnosis not present

## 2024-01-27 DIAGNOSIS — D631 Anemia in chronic kidney disease: Secondary | ICD-10-CM | POA: Diagnosis not present

## 2024-01-27 DIAGNOSIS — N2581 Secondary hyperparathyroidism of renal origin: Secondary | ICD-10-CM | POA: Diagnosis not present

## 2024-01-27 DIAGNOSIS — Z992 Dependence on renal dialysis: Secondary | ICD-10-CM | POA: Diagnosis not present

## 2024-01-27 DIAGNOSIS — N186 End stage renal disease: Secondary | ICD-10-CM | POA: Diagnosis not present

## 2024-01-29 DIAGNOSIS — N2581 Secondary hyperparathyroidism of renal origin: Secondary | ICD-10-CM | POA: Diagnosis not present

## 2024-01-29 DIAGNOSIS — Z992 Dependence on renal dialysis: Secondary | ICD-10-CM | POA: Diagnosis not present

## 2024-01-29 DIAGNOSIS — N186 End stage renal disease: Secondary | ICD-10-CM | POA: Diagnosis not present

## 2024-01-29 DIAGNOSIS — D509 Iron deficiency anemia, unspecified: Secondary | ICD-10-CM | POA: Diagnosis not present

## 2024-01-29 DIAGNOSIS — D631 Anemia in chronic kidney disease: Secondary | ICD-10-CM | POA: Diagnosis not present

## 2024-01-29 DIAGNOSIS — E876 Hypokalemia: Secondary | ICD-10-CM | POA: Diagnosis not present

## 2024-01-31 DIAGNOSIS — N186 End stage renal disease: Secondary | ICD-10-CM | POA: Diagnosis not present

## 2024-01-31 DIAGNOSIS — D509 Iron deficiency anemia, unspecified: Secondary | ICD-10-CM | POA: Diagnosis not present

## 2024-01-31 DIAGNOSIS — D631 Anemia in chronic kidney disease: Secondary | ICD-10-CM | POA: Diagnosis not present

## 2024-01-31 DIAGNOSIS — Z992 Dependence on renal dialysis: Secondary | ICD-10-CM | POA: Diagnosis not present

## 2024-01-31 DIAGNOSIS — N2581 Secondary hyperparathyroidism of renal origin: Secondary | ICD-10-CM | POA: Diagnosis not present

## 2024-01-31 DIAGNOSIS — E876 Hypokalemia: Secondary | ICD-10-CM | POA: Diagnosis not present

## 2024-02-03 DIAGNOSIS — Z992 Dependence on renal dialysis: Secondary | ICD-10-CM | POA: Diagnosis not present

## 2024-02-03 DIAGNOSIS — N186 End stage renal disease: Secondary | ICD-10-CM | POA: Diagnosis not present

## 2024-02-03 DIAGNOSIS — E876 Hypokalemia: Secondary | ICD-10-CM | POA: Diagnosis not present

## 2024-02-03 DIAGNOSIS — D631 Anemia in chronic kidney disease: Secondary | ICD-10-CM | POA: Diagnosis not present

## 2024-02-03 DIAGNOSIS — D509 Iron deficiency anemia, unspecified: Secondary | ICD-10-CM | POA: Diagnosis not present

## 2024-02-03 DIAGNOSIS — N2581 Secondary hyperparathyroidism of renal origin: Secondary | ICD-10-CM | POA: Diagnosis not present

## 2024-02-05 DIAGNOSIS — N186 End stage renal disease: Secondary | ICD-10-CM | POA: Diagnosis not present

## 2024-02-05 DIAGNOSIS — E876 Hypokalemia: Secondary | ICD-10-CM | POA: Diagnosis not present

## 2024-02-05 DIAGNOSIS — Z992 Dependence on renal dialysis: Secondary | ICD-10-CM | POA: Diagnosis not present

## 2024-02-05 DIAGNOSIS — N2581 Secondary hyperparathyroidism of renal origin: Secondary | ICD-10-CM | POA: Diagnosis not present

## 2024-02-05 DIAGNOSIS — D631 Anemia in chronic kidney disease: Secondary | ICD-10-CM | POA: Diagnosis not present

## 2024-02-05 DIAGNOSIS — D509 Iron deficiency anemia, unspecified: Secondary | ICD-10-CM | POA: Diagnosis not present

## 2024-02-06 DIAGNOSIS — M4622 Osteomyelitis of vertebra, cervical region: Secondary | ICD-10-CM | POA: Diagnosis not present

## 2024-02-06 DIAGNOSIS — M12512 Traumatic arthropathy, left shoulder: Secondary | ICD-10-CM | POA: Diagnosis not present

## 2024-02-06 DIAGNOSIS — I6930 Unspecified sequelae of cerebral infarction: Secondary | ICD-10-CM | POA: Diagnosis not present

## 2024-02-06 DIAGNOSIS — I6990 Unspecified sequelae of unspecified cerebrovascular disease: Secondary | ICD-10-CM | POA: Diagnosis not present

## 2024-02-06 DIAGNOSIS — E1165 Type 2 diabetes mellitus with hyperglycemia: Secondary | ICD-10-CM | POA: Diagnosis not present

## 2024-02-06 DIAGNOSIS — H547 Unspecified visual loss: Secondary | ICD-10-CM | POA: Diagnosis not present

## 2024-02-06 DIAGNOSIS — M1220 Villonodular synovitis (pigmented), unspecified site: Secondary | ICD-10-CM | POA: Diagnosis not present

## 2024-02-06 DIAGNOSIS — G4700 Insomnia, unspecified: Secondary | ICD-10-CM | POA: Diagnosis not present

## 2024-02-06 DIAGNOSIS — I119 Hypertensive heart disease without heart failure: Secondary | ICD-10-CM | POA: Diagnosis not present

## 2024-02-06 DIAGNOSIS — E559 Vitamin D deficiency, unspecified: Secondary | ICD-10-CM | POA: Diagnosis not present

## 2024-02-06 DIAGNOSIS — E7800 Pure hypercholesterolemia, unspecified: Secondary | ICD-10-CM | POA: Diagnosis not present

## 2024-02-06 DIAGNOSIS — Z79899 Other long term (current) drug therapy: Secondary | ICD-10-CM | POA: Diagnosis not present

## 2024-02-07 DIAGNOSIS — I120 Hypertensive chronic kidney disease with stage 5 chronic kidney disease or end stage renal disease: Secondary | ICD-10-CM | POA: Diagnosis not present

## 2024-02-07 DIAGNOSIS — E119 Type 2 diabetes mellitus without complications: Secondary | ICD-10-CM | POA: Diagnosis not present

## 2024-02-07 DIAGNOSIS — E876 Hypokalemia: Secondary | ICD-10-CM | POA: Diagnosis not present

## 2024-02-07 DIAGNOSIS — D509 Iron deficiency anemia, unspecified: Secondary | ICD-10-CM | POA: Diagnosis not present

## 2024-02-07 DIAGNOSIS — D631 Anemia in chronic kidney disease: Secondary | ICD-10-CM | POA: Diagnosis not present

## 2024-02-07 DIAGNOSIS — Z992 Dependence on renal dialysis: Secondary | ICD-10-CM | POA: Diagnosis not present

## 2024-02-07 DIAGNOSIS — N2581 Secondary hyperparathyroidism of renal origin: Secondary | ICD-10-CM | POA: Diagnosis not present

## 2024-02-07 DIAGNOSIS — N186 End stage renal disease: Secondary | ICD-10-CM | POA: Diagnosis not present

## 2024-02-10 DIAGNOSIS — E876 Hypokalemia: Secondary | ICD-10-CM | POA: Diagnosis not present

## 2024-02-10 DIAGNOSIS — D631 Anemia in chronic kidney disease: Secondary | ICD-10-CM | POA: Diagnosis not present

## 2024-02-10 DIAGNOSIS — N2581 Secondary hyperparathyroidism of renal origin: Secondary | ICD-10-CM | POA: Diagnosis not present

## 2024-02-10 DIAGNOSIS — N186 End stage renal disease: Secondary | ICD-10-CM | POA: Diagnosis not present

## 2024-02-10 DIAGNOSIS — D509 Iron deficiency anemia, unspecified: Secondary | ICD-10-CM | POA: Diagnosis not present

## 2024-02-10 DIAGNOSIS — Z992 Dependence on renal dialysis: Secondary | ICD-10-CM | POA: Diagnosis not present

## 2024-02-12 DIAGNOSIS — D631 Anemia in chronic kidney disease: Secondary | ICD-10-CM | POA: Diagnosis not present

## 2024-02-12 DIAGNOSIS — Z992 Dependence on renal dialysis: Secondary | ICD-10-CM | POA: Diagnosis not present

## 2024-02-12 DIAGNOSIS — D509 Iron deficiency anemia, unspecified: Secondary | ICD-10-CM | POA: Diagnosis not present

## 2024-02-12 DIAGNOSIS — E876 Hypokalemia: Secondary | ICD-10-CM | POA: Diagnosis not present

## 2024-02-12 DIAGNOSIS — N186 End stage renal disease: Secondary | ICD-10-CM | POA: Diagnosis not present

## 2024-02-12 DIAGNOSIS — N2581 Secondary hyperparathyroidism of renal origin: Secondary | ICD-10-CM | POA: Diagnosis not present

## 2024-02-14 DIAGNOSIS — E876 Hypokalemia: Secondary | ICD-10-CM | POA: Diagnosis not present

## 2024-02-14 DIAGNOSIS — D509 Iron deficiency anemia, unspecified: Secondary | ICD-10-CM | POA: Diagnosis not present

## 2024-02-14 DIAGNOSIS — Z992 Dependence on renal dialysis: Secondary | ICD-10-CM | POA: Diagnosis not present

## 2024-02-14 DIAGNOSIS — N2581 Secondary hyperparathyroidism of renal origin: Secondary | ICD-10-CM | POA: Diagnosis not present

## 2024-02-14 DIAGNOSIS — N186 End stage renal disease: Secondary | ICD-10-CM | POA: Diagnosis not present

## 2024-02-14 DIAGNOSIS — D631 Anemia in chronic kidney disease: Secondary | ICD-10-CM | POA: Diagnosis not present

## 2024-02-17 DIAGNOSIS — N186 End stage renal disease: Secondary | ICD-10-CM | POA: Diagnosis not present

## 2024-02-17 DIAGNOSIS — D631 Anemia in chronic kidney disease: Secondary | ICD-10-CM | POA: Diagnosis not present

## 2024-02-17 DIAGNOSIS — E876 Hypokalemia: Secondary | ICD-10-CM | POA: Diagnosis not present

## 2024-02-17 DIAGNOSIS — Z992 Dependence on renal dialysis: Secondary | ICD-10-CM | POA: Diagnosis not present

## 2024-02-17 DIAGNOSIS — N2581 Secondary hyperparathyroidism of renal origin: Secondary | ICD-10-CM | POA: Diagnosis not present

## 2024-02-17 DIAGNOSIS — D509 Iron deficiency anemia, unspecified: Secondary | ICD-10-CM | POA: Diagnosis not present

## 2024-02-19 DIAGNOSIS — D631 Anemia in chronic kidney disease: Secondary | ICD-10-CM | POA: Diagnosis not present

## 2024-02-19 DIAGNOSIS — N186 End stage renal disease: Secondary | ICD-10-CM | POA: Diagnosis not present

## 2024-02-19 DIAGNOSIS — D509 Iron deficiency anemia, unspecified: Secondary | ICD-10-CM | POA: Diagnosis not present

## 2024-02-19 DIAGNOSIS — N2581 Secondary hyperparathyroidism of renal origin: Secondary | ICD-10-CM | POA: Diagnosis not present

## 2024-02-19 DIAGNOSIS — E876 Hypokalemia: Secondary | ICD-10-CM | POA: Diagnosis not present

## 2024-02-19 DIAGNOSIS — Z992 Dependence on renal dialysis: Secondary | ICD-10-CM | POA: Diagnosis not present

## 2024-02-21 DIAGNOSIS — Z992 Dependence on renal dialysis: Secondary | ICD-10-CM | POA: Diagnosis not present

## 2024-02-21 DIAGNOSIS — N186 End stage renal disease: Secondary | ICD-10-CM | POA: Diagnosis not present

## 2024-02-21 DIAGNOSIS — D509 Iron deficiency anemia, unspecified: Secondary | ICD-10-CM | POA: Diagnosis not present

## 2024-02-21 DIAGNOSIS — N2581 Secondary hyperparathyroidism of renal origin: Secondary | ICD-10-CM | POA: Diagnosis not present

## 2024-02-21 DIAGNOSIS — D631 Anemia in chronic kidney disease: Secondary | ICD-10-CM | POA: Diagnosis not present

## 2024-02-21 DIAGNOSIS — E876 Hypokalemia: Secondary | ICD-10-CM | POA: Diagnosis not present

## 2024-02-24 DIAGNOSIS — N2581 Secondary hyperparathyroidism of renal origin: Secondary | ICD-10-CM | POA: Diagnosis not present

## 2024-02-24 DIAGNOSIS — D631 Anemia in chronic kidney disease: Secondary | ICD-10-CM | POA: Diagnosis not present

## 2024-02-24 DIAGNOSIS — Z992 Dependence on renal dialysis: Secondary | ICD-10-CM | POA: Diagnosis not present

## 2024-02-24 DIAGNOSIS — N186 End stage renal disease: Secondary | ICD-10-CM | POA: Diagnosis not present

## 2024-02-24 DIAGNOSIS — E876 Hypokalemia: Secondary | ICD-10-CM | POA: Diagnosis not present

## 2024-02-24 DIAGNOSIS — D509 Iron deficiency anemia, unspecified: Secondary | ICD-10-CM | POA: Diagnosis not present

## 2024-02-25 ENCOUNTER — Ambulatory Visit: Payer: Medicare Other | Admitting: Podiatry

## 2024-02-26 DIAGNOSIS — N2581 Secondary hyperparathyroidism of renal origin: Secondary | ICD-10-CM | POA: Diagnosis not present

## 2024-02-26 DIAGNOSIS — D631 Anemia in chronic kidney disease: Secondary | ICD-10-CM | POA: Diagnosis not present

## 2024-02-26 DIAGNOSIS — D509 Iron deficiency anemia, unspecified: Secondary | ICD-10-CM | POA: Diagnosis not present

## 2024-02-26 DIAGNOSIS — Z992 Dependence on renal dialysis: Secondary | ICD-10-CM | POA: Diagnosis not present

## 2024-02-26 DIAGNOSIS — N186 End stage renal disease: Secondary | ICD-10-CM | POA: Diagnosis not present

## 2024-02-26 DIAGNOSIS — E1129 Type 2 diabetes mellitus with other diabetic kidney complication: Secondary | ICD-10-CM | POA: Diagnosis not present

## 2024-02-26 DIAGNOSIS — E876 Hypokalemia: Secondary | ICD-10-CM | POA: Diagnosis not present

## 2024-02-28 DIAGNOSIS — N186 End stage renal disease: Secondary | ICD-10-CM | POA: Diagnosis not present

## 2024-02-28 DIAGNOSIS — Z992 Dependence on renal dialysis: Secondary | ICD-10-CM | POA: Diagnosis not present

## 2024-02-28 DIAGNOSIS — N2581 Secondary hyperparathyroidism of renal origin: Secondary | ICD-10-CM | POA: Diagnosis not present

## 2024-02-28 DIAGNOSIS — E876 Hypokalemia: Secondary | ICD-10-CM | POA: Diagnosis not present

## 2024-02-28 DIAGNOSIS — D631 Anemia in chronic kidney disease: Secondary | ICD-10-CM | POA: Diagnosis not present

## 2024-02-28 DIAGNOSIS — D509 Iron deficiency anemia, unspecified: Secondary | ICD-10-CM | POA: Diagnosis not present

## 2024-03-02 DIAGNOSIS — N2581 Secondary hyperparathyroidism of renal origin: Secondary | ICD-10-CM | POA: Diagnosis not present

## 2024-03-02 DIAGNOSIS — D509 Iron deficiency anemia, unspecified: Secondary | ICD-10-CM | POA: Diagnosis not present

## 2024-03-02 DIAGNOSIS — N186 End stage renal disease: Secondary | ICD-10-CM | POA: Diagnosis not present

## 2024-03-02 DIAGNOSIS — E876 Hypokalemia: Secondary | ICD-10-CM | POA: Diagnosis not present

## 2024-03-02 DIAGNOSIS — D631 Anemia in chronic kidney disease: Secondary | ICD-10-CM | POA: Diagnosis not present

## 2024-03-02 DIAGNOSIS — Z992 Dependence on renal dialysis: Secondary | ICD-10-CM | POA: Diagnosis not present

## 2024-03-04 DIAGNOSIS — D631 Anemia in chronic kidney disease: Secondary | ICD-10-CM | POA: Diagnosis not present

## 2024-03-04 DIAGNOSIS — N186 End stage renal disease: Secondary | ICD-10-CM | POA: Diagnosis not present

## 2024-03-04 DIAGNOSIS — D509 Iron deficiency anemia, unspecified: Secondary | ICD-10-CM | POA: Diagnosis not present

## 2024-03-04 DIAGNOSIS — Z992 Dependence on renal dialysis: Secondary | ICD-10-CM | POA: Diagnosis not present

## 2024-03-04 DIAGNOSIS — N2581 Secondary hyperparathyroidism of renal origin: Secondary | ICD-10-CM | POA: Diagnosis not present

## 2024-03-04 DIAGNOSIS — E876 Hypokalemia: Secondary | ICD-10-CM | POA: Diagnosis not present

## 2024-03-06 DIAGNOSIS — D631 Anemia in chronic kidney disease: Secondary | ICD-10-CM | POA: Diagnosis not present

## 2024-03-06 DIAGNOSIS — Z992 Dependence on renal dialysis: Secondary | ICD-10-CM | POA: Diagnosis not present

## 2024-03-06 DIAGNOSIS — E876 Hypokalemia: Secondary | ICD-10-CM | POA: Diagnosis not present

## 2024-03-06 DIAGNOSIS — N186 End stage renal disease: Secondary | ICD-10-CM | POA: Diagnosis not present

## 2024-03-06 DIAGNOSIS — D509 Iron deficiency anemia, unspecified: Secondary | ICD-10-CM | POA: Diagnosis not present

## 2024-03-06 DIAGNOSIS — N2581 Secondary hyperparathyroidism of renal origin: Secondary | ICD-10-CM | POA: Diagnosis not present

## 2024-03-09 DIAGNOSIS — I120 Hypertensive chronic kidney disease with stage 5 chronic kidney disease or end stage renal disease: Secondary | ICD-10-CM | POA: Diagnosis not present

## 2024-03-09 DIAGNOSIS — E119 Type 2 diabetes mellitus without complications: Secondary | ICD-10-CM | POA: Diagnosis not present

## 2024-03-09 DIAGNOSIS — E876 Hypokalemia: Secondary | ICD-10-CM | POA: Diagnosis not present

## 2024-03-09 DIAGNOSIS — N2581 Secondary hyperparathyroidism of renal origin: Secondary | ICD-10-CM | POA: Diagnosis not present

## 2024-03-09 DIAGNOSIS — D509 Iron deficiency anemia, unspecified: Secondary | ICD-10-CM | POA: Diagnosis not present

## 2024-03-09 DIAGNOSIS — N186 End stage renal disease: Secondary | ICD-10-CM | POA: Diagnosis not present

## 2024-03-09 DIAGNOSIS — Z992 Dependence on renal dialysis: Secondary | ICD-10-CM | POA: Diagnosis not present

## 2024-03-09 DIAGNOSIS — D631 Anemia in chronic kidney disease: Secondary | ICD-10-CM | POA: Diagnosis not present

## 2024-03-11 DIAGNOSIS — D509 Iron deficiency anemia, unspecified: Secondary | ICD-10-CM | POA: Diagnosis not present

## 2024-03-11 DIAGNOSIS — N186 End stage renal disease: Secondary | ICD-10-CM | POA: Diagnosis not present

## 2024-03-11 DIAGNOSIS — Z992 Dependence on renal dialysis: Secondary | ICD-10-CM | POA: Diagnosis not present

## 2024-03-11 DIAGNOSIS — N2581 Secondary hyperparathyroidism of renal origin: Secondary | ICD-10-CM | POA: Diagnosis not present

## 2024-03-11 DIAGNOSIS — D631 Anemia in chronic kidney disease: Secondary | ICD-10-CM | POA: Diagnosis not present

## 2024-03-11 DIAGNOSIS — E876 Hypokalemia: Secondary | ICD-10-CM | POA: Diagnosis not present

## 2024-03-13 DIAGNOSIS — Z992 Dependence on renal dialysis: Secondary | ICD-10-CM | POA: Diagnosis not present

## 2024-03-13 DIAGNOSIS — N2581 Secondary hyperparathyroidism of renal origin: Secondary | ICD-10-CM | POA: Diagnosis not present

## 2024-03-13 DIAGNOSIS — E876 Hypokalemia: Secondary | ICD-10-CM | POA: Diagnosis not present

## 2024-03-13 DIAGNOSIS — D631 Anemia in chronic kidney disease: Secondary | ICD-10-CM | POA: Diagnosis not present

## 2024-03-13 DIAGNOSIS — N186 End stage renal disease: Secondary | ICD-10-CM | POA: Diagnosis not present

## 2024-03-13 DIAGNOSIS — D509 Iron deficiency anemia, unspecified: Secondary | ICD-10-CM | POA: Diagnosis not present

## 2024-03-16 DIAGNOSIS — N186 End stage renal disease: Secondary | ICD-10-CM | POA: Diagnosis not present

## 2024-03-16 DIAGNOSIS — N2581 Secondary hyperparathyroidism of renal origin: Secondary | ICD-10-CM | POA: Diagnosis not present

## 2024-03-16 DIAGNOSIS — D509 Iron deficiency anemia, unspecified: Secondary | ICD-10-CM | POA: Diagnosis not present

## 2024-03-16 DIAGNOSIS — Z992 Dependence on renal dialysis: Secondary | ICD-10-CM | POA: Diagnosis not present

## 2024-03-16 DIAGNOSIS — E876 Hypokalemia: Secondary | ICD-10-CM | POA: Diagnosis not present

## 2024-03-16 DIAGNOSIS — D631 Anemia in chronic kidney disease: Secondary | ICD-10-CM | POA: Diagnosis not present

## 2024-03-18 DIAGNOSIS — N186 End stage renal disease: Secondary | ICD-10-CM | POA: Diagnosis not present

## 2024-03-18 DIAGNOSIS — E876 Hypokalemia: Secondary | ICD-10-CM | POA: Diagnosis not present

## 2024-03-18 DIAGNOSIS — N2581 Secondary hyperparathyroidism of renal origin: Secondary | ICD-10-CM | POA: Diagnosis not present

## 2024-03-18 DIAGNOSIS — D509 Iron deficiency anemia, unspecified: Secondary | ICD-10-CM | POA: Diagnosis not present

## 2024-03-18 DIAGNOSIS — D631 Anemia in chronic kidney disease: Secondary | ICD-10-CM | POA: Diagnosis not present

## 2024-03-18 DIAGNOSIS — Z992 Dependence on renal dialysis: Secondary | ICD-10-CM | POA: Diagnosis not present

## 2024-03-20 DIAGNOSIS — N2581 Secondary hyperparathyroidism of renal origin: Secondary | ICD-10-CM | POA: Diagnosis not present

## 2024-03-20 DIAGNOSIS — Z992 Dependence on renal dialysis: Secondary | ICD-10-CM | POA: Diagnosis not present

## 2024-03-20 DIAGNOSIS — D631 Anemia in chronic kidney disease: Secondary | ICD-10-CM | POA: Diagnosis not present

## 2024-03-20 DIAGNOSIS — E876 Hypokalemia: Secondary | ICD-10-CM | POA: Diagnosis not present

## 2024-03-20 DIAGNOSIS — N186 End stage renal disease: Secondary | ICD-10-CM | POA: Diagnosis not present

## 2024-03-20 DIAGNOSIS — D509 Iron deficiency anemia, unspecified: Secondary | ICD-10-CM | POA: Diagnosis not present

## 2024-03-23 DIAGNOSIS — Z992 Dependence on renal dialysis: Secondary | ICD-10-CM | POA: Diagnosis not present

## 2024-03-23 DIAGNOSIS — N2581 Secondary hyperparathyroidism of renal origin: Secondary | ICD-10-CM | POA: Diagnosis not present

## 2024-03-23 DIAGNOSIS — E876 Hypokalemia: Secondary | ICD-10-CM | POA: Diagnosis not present

## 2024-03-23 DIAGNOSIS — D631 Anemia in chronic kidney disease: Secondary | ICD-10-CM | POA: Diagnosis not present

## 2024-03-23 DIAGNOSIS — N186 End stage renal disease: Secondary | ICD-10-CM | POA: Diagnosis not present

## 2024-03-23 DIAGNOSIS — D509 Iron deficiency anemia, unspecified: Secondary | ICD-10-CM | POA: Diagnosis not present

## 2024-03-25 DIAGNOSIS — E876 Hypokalemia: Secondary | ICD-10-CM | POA: Diagnosis not present

## 2024-03-25 DIAGNOSIS — N2581 Secondary hyperparathyroidism of renal origin: Secondary | ICD-10-CM | POA: Diagnosis not present

## 2024-03-25 DIAGNOSIS — D631 Anemia in chronic kidney disease: Secondary | ICD-10-CM | POA: Diagnosis not present

## 2024-03-25 DIAGNOSIS — D509 Iron deficiency anemia, unspecified: Secondary | ICD-10-CM | POA: Diagnosis not present

## 2024-03-25 DIAGNOSIS — N186 End stage renal disease: Secondary | ICD-10-CM | POA: Diagnosis not present

## 2024-03-25 DIAGNOSIS — Z992 Dependence on renal dialysis: Secondary | ICD-10-CM | POA: Diagnosis not present

## 2024-03-27 DIAGNOSIS — D631 Anemia in chronic kidney disease: Secondary | ICD-10-CM | POA: Diagnosis not present

## 2024-03-27 DIAGNOSIS — N2581 Secondary hyperparathyroidism of renal origin: Secondary | ICD-10-CM | POA: Diagnosis not present

## 2024-03-27 DIAGNOSIS — E876 Hypokalemia: Secondary | ICD-10-CM | POA: Diagnosis not present

## 2024-03-27 DIAGNOSIS — D509 Iron deficiency anemia, unspecified: Secondary | ICD-10-CM | POA: Diagnosis not present

## 2024-03-27 DIAGNOSIS — Z992 Dependence on renal dialysis: Secondary | ICD-10-CM | POA: Diagnosis not present

## 2024-03-27 DIAGNOSIS — N186 End stage renal disease: Secondary | ICD-10-CM | POA: Diagnosis not present

## 2024-03-30 DIAGNOSIS — N186 End stage renal disease: Secondary | ICD-10-CM | POA: Diagnosis not present

## 2024-03-30 DIAGNOSIS — N2581 Secondary hyperparathyroidism of renal origin: Secondary | ICD-10-CM | POA: Diagnosis not present

## 2024-03-30 DIAGNOSIS — Z992 Dependence on renal dialysis: Secondary | ICD-10-CM | POA: Diagnosis not present

## 2024-03-30 DIAGNOSIS — D631 Anemia in chronic kidney disease: Secondary | ICD-10-CM | POA: Diagnosis not present

## 2024-03-30 DIAGNOSIS — D509 Iron deficiency anemia, unspecified: Secondary | ICD-10-CM | POA: Diagnosis not present

## 2024-03-30 DIAGNOSIS — E876 Hypokalemia: Secondary | ICD-10-CM | POA: Diagnosis not present

## 2024-04-01 DIAGNOSIS — E876 Hypokalemia: Secondary | ICD-10-CM | POA: Diagnosis not present

## 2024-04-01 DIAGNOSIS — Z992 Dependence on renal dialysis: Secondary | ICD-10-CM | POA: Diagnosis not present

## 2024-04-01 DIAGNOSIS — D509 Iron deficiency anemia, unspecified: Secondary | ICD-10-CM | POA: Diagnosis not present

## 2024-04-01 DIAGNOSIS — D631 Anemia in chronic kidney disease: Secondary | ICD-10-CM | POA: Diagnosis not present

## 2024-04-01 DIAGNOSIS — N2581 Secondary hyperparathyroidism of renal origin: Secondary | ICD-10-CM | POA: Diagnosis not present

## 2024-04-01 DIAGNOSIS — N186 End stage renal disease: Secondary | ICD-10-CM | POA: Diagnosis not present

## 2024-04-03 DIAGNOSIS — D631 Anemia in chronic kidney disease: Secondary | ICD-10-CM | POA: Diagnosis not present

## 2024-04-03 DIAGNOSIS — D509 Iron deficiency anemia, unspecified: Secondary | ICD-10-CM | POA: Diagnosis not present

## 2024-04-03 DIAGNOSIS — E876 Hypokalemia: Secondary | ICD-10-CM | POA: Diagnosis not present

## 2024-04-03 DIAGNOSIS — Z992 Dependence on renal dialysis: Secondary | ICD-10-CM | POA: Diagnosis not present

## 2024-04-03 DIAGNOSIS — N186 End stage renal disease: Secondary | ICD-10-CM | POA: Diagnosis not present

## 2024-04-03 DIAGNOSIS — N2581 Secondary hyperparathyroidism of renal origin: Secondary | ICD-10-CM | POA: Diagnosis not present

## 2024-04-06 DIAGNOSIS — N186 End stage renal disease: Secondary | ICD-10-CM | POA: Diagnosis not present

## 2024-04-06 DIAGNOSIS — D631 Anemia in chronic kidney disease: Secondary | ICD-10-CM | POA: Diagnosis not present

## 2024-04-06 DIAGNOSIS — Z992 Dependence on renal dialysis: Secondary | ICD-10-CM | POA: Diagnosis not present

## 2024-04-06 DIAGNOSIS — D509 Iron deficiency anemia, unspecified: Secondary | ICD-10-CM | POA: Diagnosis not present

## 2024-04-06 DIAGNOSIS — E876 Hypokalemia: Secondary | ICD-10-CM | POA: Diagnosis not present

## 2024-04-06 DIAGNOSIS — N2581 Secondary hyperparathyroidism of renal origin: Secondary | ICD-10-CM | POA: Diagnosis not present

## 2024-04-07 ENCOUNTER — Ambulatory Visit (INDEPENDENT_AMBULATORY_CARE_PROVIDER_SITE_OTHER): Admitting: Infectious Disease

## 2024-04-07 ENCOUNTER — Other Ambulatory Visit: Payer: Self-pay

## 2024-04-07 VITALS — BP 178/76 | HR 74 | Temp 98.5°F | Resp 16 | Wt 146.0 lb

## 2024-04-07 DIAGNOSIS — T847XXD Infection and inflammatory reaction due to other internal orthopedic prosthetic devices, implants and grafts, subsequent encounter: Secondary | ICD-10-CM

## 2024-04-07 DIAGNOSIS — Z992 Dependence on renal dialysis: Secondary | ICD-10-CM

## 2024-04-07 DIAGNOSIS — B957 Other staphylococcus as the cause of diseases classified elsewhere: Secondary | ICD-10-CM | POA: Diagnosis not present

## 2024-04-07 DIAGNOSIS — M4642 Discitis, unspecified, cervical region: Secondary | ICD-10-CM

## 2024-04-07 DIAGNOSIS — R7881 Bacteremia: Secondary | ICD-10-CM

## 2024-04-07 DIAGNOSIS — M4622 Osteomyelitis of vertebra, cervical region: Secondary | ICD-10-CM

## 2024-04-07 DIAGNOSIS — N186 End stage renal disease: Secondary | ICD-10-CM

## 2024-04-07 DIAGNOSIS — Z23 Encounter for immunization: Secondary | ICD-10-CM

## 2024-04-07 DIAGNOSIS — Z7185 Encounter for immunization safety counseling: Secondary | ICD-10-CM

## 2024-04-07 DIAGNOSIS — B9689 Other specified bacterial agents as the cause of diseases classified elsewhere: Secondary | ICD-10-CM

## 2024-04-07 MED ORDER — SULFAMETHOXAZOLE-TRIMETHOPRIM 400-80 MG PO TABS: 1.0000 | ORAL_TABLET | Freq: Every day | ORAL | 3 refills | Status: AC

## 2024-04-07 NOTE — Progress Notes (Signed)
 Subjective:  Chief complaint: follow-up for C spine infection   Patient ID: Shawn [name], male    DOB: [birth year], 63 y.o.   MRN: [id number]  HPI  Discussed the use of AI scribe software for clinical note transcription with the patient, who gave verbal consent to proceed.  History of Present Illness   Shawn [name] is a 63 year old male who presents for follow-up of his spinal and bloodstream infection treatment.  He has been undergoing treatment for an infection in his spine and bloodstream, initially identified in December 2024. Hospitalization occurred from July 09, 2023, to August 04, 2023, due to this condition. The infection involved his neck, where hardware is present.  Since March 2025, he has been on Bactrim  (trimethoprim -sulfamethoxazole ) daily, with adjustments on dialysis days to take it post-dialysis. The plan was to continue this antibiotic treatment for at least a year from the initial infection date.  Currently, his neck is doing okay, although he experiences stiffness when holding it down, particularly between his shoulder blades. The pain is not as severe as it was during the infection.       Past Medical History:  Diagnosis Date   Anemia    Arthritis    patient denies   Blind right eye    Complication of anesthesia    Diabetes mellitus    type II   Diskitis 08/06/2023   ESRD on hemodialysis [facility])    Dialysis since 2011 MWF   Hardware complicating wound infection 09/30/2023   HCAP (healthcare-associated pneumonia) 12/19/2022   Hypertension    Legally blind    No pertinent past medical history    PONV (postoperative nausea and vomiting)    N/V- became dehydrated had to have IV fluids- 01/2015   Shortness of breath dyspnea    when has too fluid   Staphylococcus epidermidis bacteremia 09/30/2023   Stroke (HCC) 07/09/2010   date per patient   Syncope    Unspecified cerebral artery occlusion with cerebral infarction 04/02/2013    Past  Surgical History:  Procedure Laterality Date   ANTERIOR CERVICAL CORPECTOMY N/A 07/15/2023   Procedure: Cervical six-Cervical seven CORPECTOMY;  Surgeon: Louis Shove, MD;  Location: [facility] OR;  Service: Neurosurgery;  Laterality: N/A;   AV FISTULA PLACEMENT Right 05/12/2013   Procedure: INSERTION OF ARTERIOVENOUS (AV) GORE-TEX GRAFT ARM; ULTRASOUND GUIDED;  Surgeon: Redell LITTIE Door, MD;  Location: [facility] OR;  Service: Vascular;  Laterality: Right;   AV FISTULA PLACEMENT Left 01/18/2015   Procedure: INSERTION OF ARTERIOVENOUS GORE-TEX GRAFT LEFT UPPER ARM;  Surgeon: Redell LITTIE Door, MD;  Location: [facility] OR;  Service: Vascular;  Laterality: Left;   COLONOSCOPY  06/2012   EYE SURGERY Bilateral    retina surgery both eyes, cataract surgery both eyes   HERNIA REPAIR     right inguinal   INSERTION OF DIALYSIS CATHETER Right    IR FLUORO GUIDE CV LINE RIGHT  07/12/2023   IR PTA VENOUS EXCEPT DIALYSIS CIRCUIT  07/12/2023   left arm graft  10/2010   LIGATION ARTERIOVENOUS GORTEX GRAFT Left 05/03/2015   Procedure: LIGATION ARTERIOVENOUS GORTEX GRAFT-LEFT UPPER ARM;  Surgeon: Redell LITTIE Door, MD;  Location: [facility] OR;  Service: Vascular;  Laterality: Left;   POSTERIOR CERVICAL FUSION/FORAMINOTOMY N/A 07/15/2023   Procedure: POSTERIOR CERVICAL FUSION /Cervical Five -Thoracic one;  Surgeon: Louis Shove, MD;  Location: [facility] OR;  Service: Neurosurgery;  Laterality: N/A;    Family History  Problem Relation Age of Onset   Diabetes Mother  Alzheimer's disease Mother    Cancer Father    Heart disease Father    Cancer Sister    Stroke Sister       Social History   Socioeconomic History   Marital status: Married    Spouse name: Not on file   Number of children: 0   Years of education: 12th   Highest education level: Not on file  Occupational History    Employer: UNEMPLOYED  Tobacco Use   Smoking status: Never   Smokeless tobacco: Never  Vaping Use   Vaping status: Never Used  Substance and Sexual Activity   Alcohol  use:  No    Alcohol /week: 0.0 standard drinks of alcohol    Drug use: No   Sexual activity: Not on file  Other Topics Concern   Not on file  Social History Narrative   Patient lives at home with wife.    Patient is disabled.    Patient has no children.    Patient has a 12 grade education.       Social Drivers of Corporate investment banker Strain: Not on file  Food Insecurity: No Food Insecurity (07/09/2023)   Hunger Vital Sign    Worried About Running Out of Food in the Last Year: Never true    Ran Out of Food in the Last Year: Never true  Transportation Needs: No Transportation Needs (07/09/2023)   PRAPARE - Administrator, Civil Service (Medical): No    Lack of Transportation (Non-Medical): No  Physical Activity: Not on file  Stress: Not on file  Social Connections: Not on file    No Known Allergies   Current Outpatient Medications:    acetaminophen  (TYLENOL ) 325 MG tablet, Take 1-2 tablets (325-650 mg total) by mouth every 6 (six) hours as needed., Disp: , Rfl:    amLODipine  (NORVASC ) 5 MG tablet, Take 1 tablet (5 mg total) by mouth daily., Disp: 30 tablet, Rfl: 0   aspirin  EC 325 MG tablet, Take 325 mg by mouth at bedtime., Disp: , Rfl:    atorvastatin  (LIPITOR ) 80 MG tablet, Take 1 tablet (80 mg total) by mouth daily., Disp: 90 tablet, Rfl: 11   bisacodyl  (DULCOLAX) 10 MG suppository, Place 1 suppository (10 mg total) rectally daily as needed for moderate constipation., Disp: 4 suppository, Rfl: 1   brimonidine  (ALPHAGAN ) 0.2 % ophthalmic solution, Place 1 drop into both eyes 2 (two) times daily., Disp: , Rfl:    calcitRIOL  (ROCALTROL ) 0.5 MCG capsule, Take 1 capsule (0.5 mcg total) by mouth 3 (three) times a week. (Patient taking differently: Take 0.5 mcg by mouth every Monday, Wednesday, and Friday.), Disp: 30 capsule, Rfl: 0   carvedilol  (COREG ) 3.125 MG tablet, Take 1 tablet (3.125 mg total) by mouth 2 (two) times daily with a meal., Disp: 60 tablet, Rfl: 0    Darbepoetin Alfa  (ARANESP ) 100 MCG/0.5ML SOSY injection, Inject 0.5 mLs (100 mcg total) into the skin every Friday at 6 PM., Disp: 4.2 mL, Rfl: 0   dorzolamide -timolol  (COSOPT ) 22.3-6.8 MG/ML ophthalmic solution, Place 1 drop into both eyes 2 (two) times daily., Disp: , Rfl:    lanthanum  (FOSRENOL ) 1000 MG chewable tablet, Chew 0.5 tablets (500 mg total) by mouth 3 (three) times daily with meals., Disp: , Rfl:    latanoprost  (XALATAN ) 0.005 % ophthalmic solution, Place 1 drop into both eyes at bedtime. , Disp: , Rfl:    lisinopril  (ZESTRIL ) 20 MG tablet, Take 20 mg by mouth 2 (two) times daily.,  Disp: , Rfl:    melatonin 5 MG TABS, Take 1 tablet (5 mg total) by mouth at bedtime., Disp: 30 tablet, Rfl: 0   polyethylene glycol (MIRALAX  / GLYCOLAX ) 17 g packet, Take 17 g by mouth daily as needed for mild constipation., Disp: , Rfl:    sulfamethoxazole -trimethoprim  (BACTRIM ) 400-80 MG tablet, Take 1 tablet by mouth daily. On dialysis days take after dialysis, Disp: 30 tablet, Rfl: 0   sulfamethoxazole -trimethoprim  (BACTRIM ) 400-80 MG tablet, Take 1 tablet by mouth daily. On HD days take after dialysis, Disp: 90 tablet, Rfl: 3   Review of Systems  Constitutional:  Negative for activity change, appetite change, chills, diaphoresis, fatigue, fever and unexpected weight change.  HENT:  Negative for congestion, rhinorrhea, sinus pressure, sneezing, sore throat and trouble swallowing.   Eyes:  Negative for photophobia and visual disturbance.  Respiratory:  Negative for cough, chest tightness, shortness of breath, wheezing and stridor.   Cardiovascular:  Negative for chest pain, palpitations and leg swelling.  Gastrointestinal:  Negative for abdominal distention, abdominal pain, anal bleeding, blood in stool, constipation, diarrhea, nausea and vomiting.  Genitourinary:  Negative for difficulty urinating, dysuria, flank pain and hematuria.  Musculoskeletal:  Negative for arthralgias, back pain, gait problem,  joint swelling and myalgias.  Skin:  Negative for color change, pallor, rash and wound.  Neurological:  Negative for dizziness, tremors, weakness and light-headedness.  Hematological:  Negative for adenopathy. Does not bruise/bleed easily.  Psychiatric/Behavioral:  Negative for agitation, behavioral problems, confusion, decreased concentration, dysphoric mood and sleep disturbance.        Objective:   Physical Exam Constitutional:      Appearance: He is well-developed.  HENT:     Head: Normocephalic and atraumatic.  Eyes:     Conjunctiva/sclera: Conjunctivae normal.  Cardiovascular:     Rate and Rhythm: Normal rate and regular rhythm.  Pulmonary:     Effort: Pulmonary effort is normal. No respiratory distress.     Breath sounds: No wheezing.  Abdominal:     General: There is no distension.     Palpations: Abdomen is soft.  Musculoskeletal:        General: No tenderness. Normal range of motion.     Cervical back: Normal range of motion and neck supple.  Skin:    General: Skin is warm and dry.     Coloration: Skin is not pale.     Findings: No erythema or rash.  Neurological:     General: No focal deficit present.     Mental Status: He is alert and oriented to person, place, and time.  Psychiatric:        Mood and Affect: Mood normal.        Behavior: Behavior normal.        Thought Content: Thought content normal.        Judgment: Judgment normal.           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 Neck pain improved. Risk of recurrence due to hardware. - Continue Bactrim  until January. - Order blood work for inflammatory markers. - Schedule follow-up in January for reassessment. - Instruct to report worsening neck pain immediately.  ESRD on HD on MWF  Influenza immunization Influenza immunization status uncertain. Agreed to receive flu shot for protection.

## 2024-04-08 DIAGNOSIS — E876 Hypokalemia: Secondary | ICD-10-CM | POA: Diagnosis not present

## 2024-04-08 DIAGNOSIS — N2581 Secondary hyperparathyroidism of renal origin: Secondary | ICD-10-CM | POA: Diagnosis not present

## 2024-04-08 DIAGNOSIS — D631 Anemia in chronic kidney disease: Secondary | ICD-10-CM | POA: Diagnosis not present

## 2024-04-08 DIAGNOSIS — N186 End stage renal disease: Secondary | ICD-10-CM | POA: Diagnosis not present

## 2024-04-08 DIAGNOSIS — Z992 Dependence on renal dialysis: Secondary | ICD-10-CM | POA: Diagnosis not present

## 2024-04-08 DIAGNOSIS — E119 Type 2 diabetes mellitus without complications: Secondary | ICD-10-CM | POA: Diagnosis not present

## 2024-04-08 DIAGNOSIS — D509 Iron deficiency anemia, unspecified: Secondary | ICD-10-CM | POA: Diagnosis not present

## 2024-04-08 DIAGNOSIS — I120 Hypertensive chronic kidney disease with stage 5 chronic kidney disease or end stage renal disease: Secondary | ICD-10-CM | POA: Diagnosis not present

## 2024-04-08 LAB — SEDIMENTATION RATE: Sed Rate: 17 mm/h (ref 0–20)

## 2024-04-08 LAB — CBC WITH DIFFERENTIAL/PLATELET
Absolute Lymphocytes: 1587 {cells}/uL (ref 850–3900)
Absolute Monocytes: 570 {cells}/uL (ref 200–950)
Basophils Absolute: 77 {cells}/uL (ref 0–200)
Basophils Relative: 1.2 %
Eosinophils Absolute: 1466 {cells}/uL — ABNORMAL HIGH (ref 15–500)
Eosinophils Relative: 22.9 %
HCT: 32.8 % — ABNORMAL LOW (ref 38.5–50.0)
Hemoglobin: 10.2 g/dL — ABNORMAL LOW (ref 13.2–17.1)
MCH: 28.3 pg (ref 27.0–33.0)
MCHC: 31.1 g/dL — ABNORMAL LOW (ref 32.0–36.0)
MCV: 90.9 fL (ref 80.0–100.0)
MPV: 12.7 fL — ABNORMAL HIGH (ref 7.5–12.5)
Monocytes Relative: 8.9 %
Neutro Abs: 2701 {cells}/uL (ref 1500–7800)
Neutrophils Relative %: 42.2 %
Platelets: 163 Thousand/uL (ref 140–400)
RBC: 3.61 Million/uL — ABNORMAL LOW (ref 4.20–5.80)
RDW: 15.2 % — ABNORMAL HIGH (ref 11.0–15.0)
Total Lymphocyte: 24.8 %
WBC: 6.4 Thousand/uL (ref 3.8–10.8)

## 2024-04-08 LAB — C-REACTIVE PROTEIN: CRP: 6.4 mg/L (ref <8.0)

## 2024-04-10 DIAGNOSIS — Z992 Dependence on renal dialysis: Secondary | ICD-10-CM | POA: Diagnosis not present

## 2024-04-10 DIAGNOSIS — D509 Iron deficiency anemia, unspecified: Secondary | ICD-10-CM | POA: Diagnosis not present

## 2024-04-10 DIAGNOSIS — N186 End stage renal disease: Secondary | ICD-10-CM | POA: Diagnosis not present

## 2024-04-10 DIAGNOSIS — E119 Type 2 diabetes mellitus without complications: Secondary | ICD-10-CM | POA: Diagnosis not present

## 2024-04-10 DIAGNOSIS — N2581 Secondary hyperparathyroidism of renal origin: Secondary | ICD-10-CM | POA: Diagnosis not present

## 2024-04-10 DIAGNOSIS — E876 Hypokalemia: Secondary | ICD-10-CM | POA: Diagnosis not present

## 2024-04-13 DIAGNOSIS — N2581 Secondary hyperparathyroidism of renal origin: Secondary | ICD-10-CM | POA: Diagnosis not present

## 2024-04-13 DIAGNOSIS — Z992 Dependence on renal dialysis: Secondary | ICD-10-CM | POA: Diagnosis not present

## 2024-04-13 DIAGNOSIS — E876 Hypokalemia: Secondary | ICD-10-CM | POA: Diagnosis not present

## 2024-04-13 DIAGNOSIS — N186 End stage renal disease: Secondary | ICD-10-CM | POA: Diagnosis not present

## 2024-04-13 DIAGNOSIS — D509 Iron deficiency anemia, unspecified: Secondary | ICD-10-CM | POA: Diagnosis not present

## 2024-04-13 DIAGNOSIS — E119 Type 2 diabetes mellitus without complications: Secondary | ICD-10-CM | POA: Diagnosis not present

## 2024-04-15 DIAGNOSIS — N186 End stage renal disease: Secondary | ICD-10-CM | POA: Diagnosis not present

## 2024-04-15 DIAGNOSIS — Z992 Dependence on renal dialysis: Secondary | ICD-10-CM | POA: Diagnosis not present

## 2024-04-15 DIAGNOSIS — E876 Hypokalemia: Secondary | ICD-10-CM | POA: Diagnosis not present

## 2024-04-15 DIAGNOSIS — D509 Iron deficiency anemia, unspecified: Secondary | ICD-10-CM | POA: Diagnosis not present

## 2024-04-15 DIAGNOSIS — E119 Type 2 diabetes mellitus without complications: Secondary | ICD-10-CM | POA: Diagnosis not present

## 2024-04-15 DIAGNOSIS — N2581 Secondary hyperparathyroidism of renal origin: Secondary | ICD-10-CM | POA: Diagnosis not present

## 2024-04-16 DIAGNOSIS — G992 Myelopathy in diseases classified elsewhere: Secondary | ICD-10-CM | POA: Diagnosis not present

## 2024-04-16 DIAGNOSIS — M4802 Spinal stenosis, cervical region: Secondary | ICD-10-CM | POA: Diagnosis not present

## 2024-04-17 DIAGNOSIS — E119 Type 2 diabetes mellitus without complications: Secondary | ICD-10-CM | POA: Diagnosis not present

## 2024-04-17 DIAGNOSIS — N186 End stage renal disease: Secondary | ICD-10-CM | POA: Diagnosis not present

## 2024-04-17 DIAGNOSIS — E876 Hypokalemia: Secondary | ICD-10-CM | POA: Diagnosis not present

## 2024-04-17 DIAGNOSIS — D509 Iron deficiency anemia, unspecified: Secondary | ICD-10-CM | POA: Diagnosis not present

## 2024-04-17 DIAGNOSIS — Z992 Dependence on renal dialysis: Secondary | ICD-10-CM | POA: Diagnosis not present

## 2024-04-17 DIAGNOSIS — N2581 Secondary hyperparathyroidism of renal origin: Secondary | ICD-10-CM | POA: Diagnosis not present

## 2024-04-20 DIAGNOSIS — Z992 Dependence on renal dialysis: Secondary | ICD-10-CM | POA: Diagnosis not present

## 2024-04-20 DIAGNOSIS — D509 Iron deficiency anemia, unspecified: Secondary | ICD-10-CM | POA: Diagnosis not present

## 2024-04-20 DIAGNOSIS — N186 End stage renal disease: Secondary | ICD-10-CM | POA: Diagnosis not present

## 2024-04-20 DIAGNOSIS — E119 Type 2 diabetes mellitus without complications: Secondary | ICD-10-CM | POA: Diagnosis not present

## 2024-04-20 DIAGNOSIS — E876 Hypokalemia: Secondary | ICD-10-CM | POA: Diagnosis not present

## 2024-04-20 DIAGNOSIS — N2581 Secondary hyperparathyroidism of renal origin: Secondary | ICD-10-CM | POA: Diagnosis not present

## 2024-04-21 DIAGNOSIS — H16223 Keratoconjunctivitis sicca, not specified as Sjogren's, bilateral: Secondary | ICD-10-CM | POA: Diagnosis not present

## 2024-04-21 DIAGNOSIS — H18511 Endothelial corneal dystrophy, right eye: Secondary | ICD-10-CM | POA: Diagnosis not present

## 2024-04-21 DIAGNOSIS — Z961 Presence of intraocular lens: Secondary | ICD-10-CM | POA: Diagnosis not present

## 2024-04-21 DIAGNOSIS — H401133 Primary open-angle glaucoma, bilateral, severe stage: Secondary | ICD-10-CM | POA: Diagnosis not present

## 2024-04-21 DIAGNOSIS — H16122 Filamentary keratitis, left eye: Secondary | ICD-10-CM | POA: Diagnosis not present

## 2024-04-21 DIAGNOSIS — E113553 Type 2 diabetes mellitus with stable proliferative diabetic retinopathy, bilateral: Secondary | ICD-10-CM | POA: Diagnosis not present

## 2024-04-21 DIAGNOSIS — H33051 Total retinal detachment, right eye: Secondary | ICD-10-CM | POA: Diagnosis not present

## 2024-04-21 DIAGNOSIS — H31093 Other chorioretinal scars, bilateral: Secondary | ICD-10-CM | POA: Diagnosis not present

## 2024-04-22 DIAGNOSIS — Z992 Dependence on renal dialysis: Secondary | ICD-10-CM | POA: Diagnosis not present

## 2024-04-22 DIAGNOSIS — N2581 Secondary hyperparathyroidism of renal origin: Secondary | ICD-10-CM | POA: Diagnosis not present

## 2024-04-22 DIAGNOSIS — D509 Iron deficiency anemia, unspecified: Secondary | ICD-10-CM | POA: Diagnosis not present

## 2024-04-22 DIAGNOSIS — E876 Hypokalemia: Secondary | ICD-10-CM | POA: Diagnosis not present

## 2024-04-22 DIAGNOSIS — N186 End stage renal disease: Secondary | ICD-10-CM | POA: Diagnosis not present

## 2024-04-22 DIAGNOSIS — E119 Type 2 diabetes mellitus without complications: Secondary | ICD-10-CM | POA: Diagnosis not present

## 2024-04-24 DIAGNOSIS — E876 Hypokalemia: Secondary | ICD-10-CM | POA: Diagnosis not present

## 2024-04-24 DIAGNOSIS — Z992 Dependence on renal dialysis: Secondary | ICD-10-CM | POA: Diagnosis not present

## 2024-04-24 DIAGNOSIS — D509 Iron deficiency anemia, unspecified: Secondary | ICD-10-CM | POA: Diagnosis not present

## 2024-04-24 DIAGNOSIS — E119 Type 2 diabetes mellitus without complications: Secondary | ICD-10-CM | POA: Diagnosis not present

## 2024-04-24 DIAGNOSIS — N186 End stage renal disease: Secondary | ICD-10-CM | POA: Diagnosis not present

## 2024-04-24 DIAGNOSIS — N2581 Secondary hyperparathyroidism of renal origin: Secondary | ICD-10-CM | POA: Diagnosis not present

## 2024-04-27 DIAGNOSIS — D509 Iron deficiency anemia, unspecified: Secondary | ICD-10-CM | POA: Diagnosis not present

## 2024-04-27 DIAGNOSIS — Z992 Dependence on renal dialysis: Secondary | ICD-10-CM | POA: Diagnosis not present

## 2024-04-27 DIAGNOSIS — N2581 Secondary hyperparathyroidism of renal origin: Secondary | ICD-10-CM | POA: Diagnosis not present

## 2024-04-27 DIAGNOSIS — E876 Hypokalemia: Secondary | ICD-10-CM | POA: Diagnosis not present

## 2024-04-27 DIAGNOSIS — N186 End stage renal disease: Secondary | ICD-10-CM | POA: Diagnosis not present

## 2024-04-27 DIAGNOSIS — E119 Type 2 diabetes mellitus without complications: Secondary | ICD-10-CM | POA: Diagnosis not present

## 2024-04-28 ENCOUNTER — Ambulatory Visit: Admitting: Podiatry

## 2024-04-28 ENCOUNTER — Encounter: Payer: Self-pay | Admitting: Podiatry

## 2024-04-28 DIAGNOSIS — M79675 Pain in left toe(s): Secondary | ICD-10-CM

## 2024-04-28 DIAGNOSIS — B351 Tinea unguium: Secondary | ICD-10-CM | POA: Diagnosis not present

## 2024-04-28 DIAGNOSIS — Z992 Dependence on renal dialysis: Secondary | ICD-10-CM | POA: Diagnosis not present

## 2024-04-28 DIAGNOSIS — E1151 Type 2 diabetes mellitus with diabetic peripheral angiopathy without gangrene: Secondary | ICD-10-CM

## 2024-04-28 DIAGNOSIS — M79674 Pain in right toe(s): Secondary | ICD-10-CM | POA: Diagnosis not present

## 2024-04-28 DIAGNOSIS — N186 End stage renal disease: Secondary | ICD-10-CM | POA: Diagnosis not present

## 2024-04-29 DIAGNOSIS — D509 Iron deficiency anemia, unspecified: Secondary | ICD-10-CM | POA: Diagnosis not present

## 2024-04-29 DIAGNOSIS — E119 Type 2 diabetes mellitus without complications: Secondary | ICD-10-CM | POA: Diagnosis not present

## 2024-04-29 DIAGNOSIS — N186 End stage renal disease: Secondary | ICD-10-CM | POA: Diagnosis not present

## 2024-04-29 DIAGNOSIS — Z992 Dependence on renal dialysis: Secondary | ICD-10-CM | POA: Diagnosis not present

## 2024-04-29 DIAGNOSIS — E876 Hypokalemia: Secondary | ICD-10-CM | POA: Diagnosis not present

## 2024-04-29 DIAGNOSIS — N2581 Secondary hyperparathyroidism of renal origin: Secondary | ICD-10-CM | POA: Diagnosis not present

## 2024-04-30 DIAGNOSIS — H18511 Endothelial corneal dystrophy, right eye: Secondary | ICD-10-CM | POA: Diagnosis not present

## 2024-04-30 DIAGNOSIS — Z961 Presence of intraocular lens: Secondary | ICD-10-CM | POA: Diagnosis not present

## 2024-04-30 DIAGNOSIS — H401133 Primary open-angle glaucoma, bilateral, severe stage: Secondary | ICD-10-CM | POA: Diagnosis not present

## 2024-04-30 DIAGNOSIS — H16223 Keratoconjunctivitis sicca, not specified as Sjogren's, bilateral: Secondary | ICD-10-CM | POA: Diagnosis not present

## 2024-05-01 DIAGNOSIS — E876 Hypokalemia: Secondary | ICD-10-CM | POA: Diagnosis not present

## 2024-05-01 DIAGNOSIS — E119 Type 2 diabetes mellitus without complications: Secondary | ICD-10-CM | POA: Diagnosis not present

## 2024-05-01 DIAGNOSIS — D509 Iron deficiency anemia, unspecified: Secondary | ICD-10-CM | POA: Diagnosis not present

## 2024-05-01 DIAGNOSIS — N186 End stage renal disease: Secondary | ICD-10-CM | POA: Diagnosis not present

## 2024-05-01 DIAGNOSIS — Z992 Dependence on renal dialysis: Secondary | ICD-10-CM | POA: Diagnosis not present

## 2024-05-01 DIAGNOSIS — N2581 Secondary hyperparathyroidism of renal origin: Secondary | ICD-10-CM | POA: Diagnosis not present

## 2024-05-03 NOTE — Progress Notes (Signed)
  Subjective:  Patient ID: Shawn [name], male    DOB: Aug 16, 1960,  MRN: [id number]  Shawn [name] presents to clinic today for at risk foot care with h/o NIDDM with ESRD on hemodialysis and painful thick toenails that are difficult to trim. Pain interferes with ambulation. Aggravating factors include wearing enclosed shoe gear. Pain is relieved with periodic professional debridement.  Chief Complaint  Patient presents with   Diabetes    Patient stated his last A1c was 5.5 and that he last saw his PCP in August 2025, Patient stated his PCP name is Zachary Civatte   New problem(s): None.   PCP is Civatte Zachary, MD.  No Known Allergies  Review of Systems: Negative except as noted in the HPI.  Objective: No changes noted in today's physical examination. There were no vitals filed for this visit. Shawn [name] is a pleasant 63 y.o. male in NAD. AAO x 3.  Vascular Examination: CFT <4 seconds b/l. Nonpalpable pedal pulses b/l.  Digital hair absent. Skin temperature gradient warm to cool b/l. No ischemia or gangrene. No cyanosis or clubbing noted b/l. No edema noted b/l LE.   Neurological Examination: Protective sensation diminished with 10g monofilament b/l.  Dermatological Examination: Pedal skin thin, shiny and atrophic b/l. No open wounds. No interdigital macerations.   Toenails 1-5 b/l thick, discolored, elongated with subungual debris and pain on dorsal palpation.   Incurvated nailplate medial border left hallux with tenderness to palpation. No erythema, no edema, no drainage noted. No fluctuance.   No corns, calluses nor porokeratotic lesions noted.  Musculoskeletal Examination: Muscle strength 5/5 to all lower extremity muscle groups bilaterally. Hammertoe(s) L 2nd toe and R 2nd toe.  Radiographs: None  Assessment/Plan: 1. Pain due to onychomycosis of toenails of both feet   2. ESRD on dialysis (HCC)   3. Type II diabetes mellitus with  peripheral circulatory disorder (HCC)   -Consent given for treatment as described below: -Examined patient. -Continue foot and shoe inspections daily. Monitor blood glucose per PCP/Endocrinologist's recommendations. -Patient to continue soft, supportive shoe gear daily. -Mycotic toenails 2-5 bilaterally were debrided in length and girth with sterile nail nippers and dremel without iatrogenic bleeding. -No invasive procedure(s) performed. Offending nail border debrided and curretaged bilateral great toes utilizing sterile nail nipper and currette. Border(s) cleansed with alcohol  and triple antibiotic ointment applied. Patient/POA/Caregiver/Facility instructed to apply triple antibiotic ointment  to left great toe and right great toe once daily for 7 days. Call office if there are any concerns. -Patient/POA to call should there be question/concern in the interim.   Return in about 9 weeks (around 06/30/2024).  Delon LITTIE Merlin, DPM      [place] LOCATION: [address] [place] [address]                   Office [phone]   [facility] LOCATION: [address] [place], [place] [address] Office [phone]

## 2024-05-04 DIAGNOSIS — Z992 Dependence on renal dialysis: Secondary | ICD-10-CM | POA: Diagnosis not present

## 2024-05-04 DIAGNOSIS — N186 End stage renal disease: Secondary | ICD-10-CM | POA: Diagnosis not present

## 2024-05-04 DIAGNOSIS — E119 Type 2 diabetes mellitus without complications: Secondary | ICD-10-CM | POA: Diagnosis not present

## 2024-05-04 DIAGNOSIS — N2581 Secondary hyperparathyroidism of renal origin: Secondary | ICD-10-CM | POA: Diagnosis not present

## 2024-05-04 DIAGNOSIS — E876 Hypokalemia: Secondary | ICD-10-CM | POA: Diagnosis not present

## 2024-05-04 DIAGNOSIS — D509 Iron deficiency anemia, unspecified: Secondary | ICD-10-CM | POA: Diagnosis not present

## 2024-05-06 DIAGNOSIS — N2581 Secondary hyperparathyroidism of renal origin: Secondary | ICD-10-CM | POA: Diagnosis not present

## 2024-05-06 DIAGNOSIS — D509 Iron deficiency anemia, unspecified: Secondary | ICD-10-CM | POA: Diagnosis not present

## 2024-05-06 DIAGNOSIS — Z992 Dependence on renal dialysis: Secondary | ICD-10-CM | POA: Diagnosis not present

## 2024-05-06 DIAGNOSIS — E876 Hypokalemia: Secondary | ICD-10-CM | POA: Diagnosis not present

## 2024-05-06 DIAGNOSIS — E119 Type 2 diabetes mellitus without complications: Secondary | ICD-10-CM | POA: Diagnosis not present

## 2024-05-06 DIAGNOSIS — N186 End stage renal disease: Secondary | ICD-10-CM | POA: Diagnosis not present

## 2024-05-07 DIAGNOSIS — H18511 Endothelial corneal dystrophy, right eye: Secondary | ICD-10-CM | POA: Diagnosis not present

## 2024-05-07 DIAGNOSIS — Z961 Presence of intraocular lens: Secondary | ICD-10-CM | POA: Diagnosis not present

## 2024-05-07 DIAGNOSIS — H16122 Filamentary keratitis, left eye: Secondary | ICD-10-CM | POA: Diagnosis not present

## 2024-05-07 DIAGNOSIS — S0502XA Injury of conjunctiva and corneal abrasion without foreign body, left eye, initial encounter: Secondary | ICD-10-CM | POA: Diagnosis not present

## 2024-05-08 DIAGNOSIS — N186 End stage renal disease: Secondary | ICD-10-CM | POA: Diagnosis not present

## 2024-05-08 DIAGNOSIS — N2581 Secondary hyperparathyroidism of renal origin: Secondary | ICD-10-CM | POA: Diagnosis not present

## 2024-05-08 DIAGNOSIS — D509 Iron deficiency anemia, unspecified: Secondary | ICD-10-CM | POA: Diagnosis not present

## 2024-05-08 DIAGNOSIS — E119 Type 2 diabetes mellitus without complications: Secondary | ICD-10-CM | POA: Diagnosis not present

## 2024-05-08 DIAGNOSIS — E876 Hypokalemia: Secondary | ICD-10-CM | POA: Diagnosis not present

## 2024-05-08 DIAGNOSIS — Z992 Dependence on renal dialysis: Secondary | ICD-10-CM | POA: Diagnosis not present

## 2024-05-09 DIAGNOSIS — I120 Hypertensive chronic kidney disease with stage 5 chronic kidney disease or end stage renal disease: Secondary | ICD-10-CM | POA: Diagnosis not present

## 2024-05-09 DIAGNOSIS — N186 End stage renal disease: Secondary | ICD-10-CM | POA: Diagnosis not present

## 2024-05-09 DIAGNOSIS — Z992 Dependence on renal dialysis: Secondary | ICD-10-CM | POA: Diagnosis not present

## 2024-05-11 DIAGNOSIS — D631 Anemia in chronic kidney disease: Secondary | ICD-10-CM | POA: Diagnosis not present

## 2024-05-11 DIAGNOSIS — N186 End stage renal disease: Secondary | ICD-10-CM | POA: Diagnosis not present

## 2024-05-11 DIAGNOSIS — D509 Iron deficiency anemia, unspecified: Secondary | ICD-10-CM | POA: Diagnosis not present

## 2024-05-11 DIAGNOSIS — E119 Type 2 diabetes mellitus without complications: Secondary | ICD-10-CM | POA: Diagnosis not present

## 2024-05-11 DIAGNOSIS — E876 Hypokalemia: Secondary | ICD-10-CM | POA: Diagnosis not present

## 2024-05-11 DIAGNOSIS — N2581 Secondary hyperparathyroidism of renal origin: Secondary | ICD-10-CM | POA: Diagnosis not present

## 2024-05-11 DIAGNOSIS — Z992 Dependence on renal dialysis: Secondary | ICD-10-CM | POA: Diagnosis not present

## 2024-05-12 DIAGNOSIS — H16122 Filamentary keratitis, left eye: Secondary | ICD-10-CM | POA: Diagnosis not present

## 2024-05-12 DIAGNOSIS — Z961 Presence of intraocular lens: Secondary | ICD-10-CM | POA: Diagnosis not present

## 2024-05-12 DIAGNOSIS — H18511 Endothelial corneal dystrophy, right eye: Secondary | ICD-10-CM | POA: Diagnosis not present

## 2024-05-12 DIAGNOSIS — S0502XD Injury of conjunctiva and corneal abrasion without foreign body, left eye, subsequent encounter: Secondary | ICD-10-CM | POA: Diagnosis not present

## 2024-05-13 DIAGNOSIS — N2581 Secondary hyperparathyroidism of renal origin: Secondary | ICD-10-CM | POA: Diagnosis not present

## 2024-05-13 DIAGNOSIS — N186 End stage renal disease: Secondary | ICD-10-CM | POA: Diagnosis not present

## 2024-05-13 DIAGNOSIS — E119 Type 2 diabetes mellitus without complications: Secondary | ICD-10-CM | POA: Diagnosis not present

## 2024-05-13 DIAGNOSIS — D509 Iron deficiency anemia, unspecified: Secondary | ICD-10-CM | POA: Diagnosis not present

## 2024-05-13 DIAGNOSIS — E876 Hypokalemia: Secondary | ICD-10-CM | POA: Diagnosis not present

## 2024-05-13 DIAGNOSIS — Z992 Dependence on renal dialysis: Secondary | ICD-10-CM | POA: Diagnosis not present

## 2024-05-15 DIAGNOSIS — E876 Hypokalemia: Secondary | ICD-10-CM | POA: Diagnosis not present

## 2024-05-15 DIAGNOSIS — D509 Iron deficiency anemia, unspecified: Secondary | ICD-10-CM | POA: Diagnosis not present

## 2024-05-15 DIAGNOSIS — Z992 Dependence on renal dialysis: Secondary | ICD-10-CM | POA: Diagnosis not present

## 2024-05-15 DIAGNOSIS — N2581 Secondary hyperparathyroidism of renal origin: Secondary | ICD-10-CM | POA: Diagnosis not present

## 2024-05-15 DIAGNOSIS — N186 End stage renal disease: Secondary | ICD-10-CM | POA: Diagnosis not present

## 2024-05-15 DIAGNOSIS — E119 Type 2 diabetes mellitus without complications: Secondary | ICD-10-CM | POA: Diagnosis not present

## 2024-05-18 DIAGNOSIS — N2581 Secondary hyperparathyroidism of renal origin: Secondary | ICD-10-CM | POA: Diagnosis not present

## 2024-05-18 DIAGNOSIS — E119 Type 2 diabetes mellitus without complications: Secondary | ICD-10-CM | POA: Diagnosis not present

## 2024-05-18 DIAGNOSIS — E876 Hypokalemia: Secondary | ICD-10-CM | POA: Diagnosis not present

## 2024-05-18 DIAGNOSIS — N186 End stage renal disease: Secondary | ICD-10-CM | POA: Diagnosis not present

## 2024-05-18 DIAGNOSIS — Z992 Dependence on renal dialysis: Secondary | ICD-10-CM | POA: Diagnosis not present

## 2024-05-18 DIAGNOSIS — D509 Iron deficiency anemia, unspecified: Secondary | ICD-10-CM | POA: Diagnosis not present

## 2024-05-20 DIAGNOSIS — N186 End stage renal disease: Secondary | ICD-10-CM | POA: Diagnosis not present

## 2024-05-20 DIAGNOSIS — E876 Hypokalemia: Secondary | ICD-10-CM | POA: Diagnosis not present

## 2024-05-20 DIAGNOSIS — D509 Iron deficiency anemia, unspecified: Secondary | ICD-10-CM | POA: Diagnosis not present

## 2024-05-20 DIAGNOSIS — E119 Type 2 diabetes mellitus without complications: Secondary | ICD-10-CM | POA: Diagnosis not present

## 2024-05-20 DIAGNOSIS — N2581 Secondary hyperparathyroidism of renal origin: Secondary | ICD-10-CM | POA: Diagnosis not present

## 2024-05-20 DIAGNOSIS — Z992 Dependence on renal dialysis: Secondary | ICD-10-CM | POA: Diagnosis not present

## 2024-05-22 DIAGNOSIS — N2581 Secondary hyperparathyroidism of renal origin: Secondary | ICD-10-CM | POA: Diagnosis not present

## 2024-05-22 DIAGNOSIS — N186 End stage renal disease: Secondary | ICD-10-CM | POA: Diagnosis not present

## 2024-05-22 DIAGNOSIS — Z992 Dependence on renal dialysis: Secondary | ICD-10-CM | POA: Diagnosis not present

## 2024-05-22 DIAGNOSIS — E876 Hypokalemia: Secondary | ICD-10-CM | POA: Diagnosis not present

## 2024-05-22 DIAGNOSIS — D509 Iron deficiency anemia, unspecified: Secondary | ICD-10-CM | POA: Diagnosis not present

## 2024-05-22 DIAGNOSIS — E119 Type 2 diabetes mellitus without complications: Secondary | ICD-10-CM | POA: Diagnosis not present

## 2024-05-25 DIAGNOSIS — Z992 Dependence on renal dialysis: Secondary | ICD-10-CM | POA: Diagnosis not present

## 2024-05-25 DIAGNOSIS — E119 Type 2 diabetes mellitus without complications: Secondary | ICD-10-CM | POA: Diagnosis not present

## 2024-05-25 DIAGNOSIS — E876 Hypokalemia: Secondary | ICD-10-CM | POA: Diagnosis not present

## 2024-05-25 DIAGNOSIS — D509 Iron deficiency anemia, unspecified: Secondary | ICD-10-CM | POA: Diagnosis not present

## 2024-05-25 DIAGNOSIS — N186 End stage renal disease: Secondary | ICD-10-CM | POA: Diagnosis not present

## 2024-05-25 DIAGNOSIS — N2581 Secondary hyperparathyroidism of renal origin: Secondary | ICD-10-CM | POA: Diagnosis not present

## 2024-05-27 DIAGNOSIS — E119 Type 2 diabetes mellitus without complications: Secondary | ICD-10-CM | POA: Diagnosis not present

## 2024-05-27 DIAGNOSIS — N186 End stage renal disease: Secondary | ICD-10-CM | POA: Diagnosis not present

## 2024-05-27 DIAGNOSIS — E876 Hypokalemia: Secondary | ICD-10-CM | POA: Diagnosis not present

## 2024-05-27 DIAGNOSIS — Z992 Dependence on renal dialysis: Secondary | ICD-10-CM | POA: Diagnosis not present

## 2024-05-27 DIAGNOSIS — N2581 Secondary hyperparathyroidism of renal origin: Secondary | ICD-10-CM | POA: Diagnosis not present

## 2024-05-27 DIAGNOSIS — D509 Iron deficiency anemia, unspecified: Secondary | ICD-10-CM | POA: Diagnosis not present

## 2024-05-29 DIAGNOSIS — E119 Type 2 diabetes mellitus without complications: Secondary | ICD-10-CM | POA: Diagnosis not present

## 2024-05-29 DIAGNOSIS — D509 Iron deficiency anemia, unspecified: Secondary | ICD-10-CM | POA: Diagnosis not present

## 2024-05-29 DIAGNOSIS — Z992 Dependence on renal dialysis: Secondary | ICD-10-CM | POA: Diagnosis not present

## 2024-05-29 DIAGNOSIS — E876 Hypokalemia: Secondary | ICD-10-CM | POA: Diagnosis not present

## 2024-05-29 DIAGNOSIS — N2581 Secondary hyperparathyroidism of renal origin: Secondary | ICD-10-CM | POA: Diagnosis not present

## 2024-05-29 DIAGNOSIS — N186 End stage renal disease: Secondary | ICD-10-CM | POA: Diagnosis not present

## 2024-05-31 DIAGNOSIS — N2581 Secondary hyperparathyroidism of renal origin: Secondary | ICD-10-CM | POA: Diagnosis not present

## 2024-05-31 DIAGNOSIS — N186 End stage renal disease: Secondary | ICD-10-CM | POA: Diagnosis not present

## 2024-05-31 DIAGNOSIS — E119 Type 2 diabetes mellitus without complications: Secondary | ICD-10-CM | POA: Diagnosis not present

## 2024-05-31 DIAGNOSIS — E876 Hypokalemia: Secondary | ICD-10-CM | POA: Diagnosis not present

## 2024-05-31 DIAGNOSIS — D509 Iron deficiency anemia, unspecified: Secondary | ICD-10-CM | POA: Diagnosis not present

## 2024-05-31 DIAGNOSIS — Z992 Dependence on renal dialysis: Secondary | ICD-10-CM | POA: Diagnosis not present

## 2024-06-01 DIAGNOSIS — Z992 Dependence on renal dialysis: Secondary | ICD-10-CM | POA: Diagnosis not present

## 2024-06-01 DIAGNOSIS — N2581 Secondary hyperparathyroidism of renal origin: Secondary | ICD-10-CM | POA: Diagnosis not present

## 2024-06-01 DIAGNOSIS — D509 Iron deficiency anemia, unspecified: Secondary | ICD-10-CM | POA: Diagnosis not present

## 2024-06-01 DIAGNOSIS — N186 End stage renal disease: Secondary | ICD-10-CM | POA: Diagnosis not present

## 2024-06-01 DIAGNOSIS — E119 Type 2 diabetes mellitus without complications: Secondary | ICD-10-CM | POA: Diagnosis not present

## 2024-06-01 DIAGNOSIS — E876 Hypokalemia: Secondary | ICD-10-CM | POA: Diagnosis not present

## 2024-06-02 DIAGNOSIS — N2581 Secondary hyperparathyroidism of renal origin: Secondary | ICD-10-CM | POA: Diagnosis not present

## 2024-06-02 DIAGNOSIS — D509 Iron deficiency anemia, unspecified: Secondary | ICD-10-CM | POA: Diagnosis not present

## 2024-06-02 DIAGNOSIS — E119 Type 2 diabetes mellitus without complications: Secondary | ICD-10-CM | POA: Diagnosis not present

## 2024-06-02 DIAGNOSIS — Z992 Dependence on renal dialysis: Secondary | ICD-10-CM | POA: Diagnosis not present

## 2024-06-02 DIAGNOSIS — N186 End stage renal disease: Secondary | ICD-10-CM | POA: Diagnosis not present

## 2024-06-02 DIAGNOSIS — E876 Hypokalemia: Secondary | ICD-10-CM | POA: Diagnosis not present

## 2024-06-05 DIAGNOSIS — E876 Hypokalemia: Secondary | ICD-10-CM | POA: Diagnosis not present

## 2024-06-05 DIAGNOSIS — E119 Type 2 diabetes mellitus without complications: Secondary | ICD-10-CM | POA: Diagnosis not present

## 2024-06-05 DIAGNOSIS — Z992 Dependence on renal dialysis: Secondary | ICD-10-CM | POA: Diagnosis not present

## 2024-06-05 DIAGNOSIS — N186 End stage renal disease: Secondary | ICD-10-CM | POA: Diagnosis not present

## 2024-06-05 DIAGNOSIS — D509 Iron deficiency anemia, unspecified: Secondary | ICD-10-CM | POA: Diagnosis not present

## 2024-06-05 DIAGNOSIS — N2581 Secondary hyperparathyroidism of renal origin: Secondary | ICD-10-CM | POA: Diagnosis not present

## 2024-06-30 ENCOUNTER — Encounter: Payer: Self-pay | Admitting: Podiatry

## 2024-06-30 ENCOUNTER — Ambulatory Visit: Admitting: Podiatry

## 2024-06-30 DIAGNOSIS — B351 Tinea unguium: Secondary | ICD-10-CM

## 2024-06-30 DIAGNOSIS — Z992 Dependence on renal dialysis: Secondary | ICD-10-CM

## 2024-06-30 DIAGNOSIS — E1151 Type 2 diabetes mellitus with diabetic peripheral angiopathy without gangrene: Secondary | ICD-10-CM | POA: Diagnosis not present

## 2024-06-30 DIAGNOSIS — M79675 Pain in left toe(s): Secondary | ICD-10-CM

## 2024-06-30 DIAGNOSIS — M79674 Pain in right toe(s): Secondary | ICD-10-CM

## 2024-06-30 DIAGNOSIS — N186 End stage renal disease: Secondary | ICD-10-CM | POA: Diagnosis not present

## 2024-06-30 NOTE — Progress Notes (Signed)
"  °  Subjective:  Patient ID: Shawn [name], male    DOB: 1960/10/14,  MRN: [id number]  Shawn [name] presents to clinic today for at risk foot care with h/o NIDDM with ESRD on hemodialysis and painful mycotic toenails x 10 which interfere with daily activities. Pain is relieved with periodic professional debridement.  Chief Complaint  Patient presents with   RFC     RFC Toenail trim. A1C 5.8. LOV with PCP 02/06/24.   New problem(s): None.   PCP is Hillman Bare, MD.  Allergies[1]  Review of Systems: Negative except as noted in the HPI.  Objective: No changes noted in today's physical examination. There were no vitals filed for this visit. Shawn [name] is a pleasant 63 y.o. male in NAD. AAO x 3.  Vascular Examination: CFT <4 seconds b/l. Nonpalpable pedal pulses b/l.  Digital hair absent. Skin temperature gradient warm to cool b/l. No ischemia or gangrene. No cyanosis or clubbing noted b/l. No edema noted b/l LE.   Neurological Examination: Protective sensation diminished with 10g monofilament b/l.  Dermatological Examination: Pedal skin thin, shiny and atrophic b/l. No open wounds. No interdigital macerations.   Toenails 1-5 b/l thick, discolored, elongated with subungual debris and pain on dorsal palpation.   Incurvated nailplate medial border left hallux with tenderness to palpation. No erythema, no edema, no drainage noted. No fluctuance.   No corns, calluses nor porokeratotic lesions noted.  Musculoskeletal Examination: Muscle strength 5/5 to all lower extremity muscle groups bilaterally. Hammertoe(s) L 2nd toe and R 2nd toe.  Radiographs: None  Assessment/Plan: 1. Pain due to onychomycosis of toenails of both feet   2. ESRD on dialysis (HCC)   3. Type II diabetes mellitus with peripheral circulatory disorder [facility])   Patient was evaluated and treated. All patient's and/or POA's questions/concerns addressed on today's visit. Toenails 2-5  bilaterally debrided in length and girth without incident. Continue foot and shoe inspections daily. Monitor blood glucose per PCP/Endocrinologist's recommendations. Continue soft, supportive shoe gear daily. Report any pedal injuries to medical professional. Call office if there are any questions/concerns. -No invasive procedure(s) performed. Offending nail border debrided and curretaged bilateral great toes utilizing sterile nail nipper and currette. Border(s) cleansed with alcohol  and triple antibiotic ointment applied. Patient/POA/Caregiver/Facility instructed to apply Neosporin Cream  to bilateral great toes once daily for 7 days. Call office if there are any concerns. -Patient/POA to call should there be question/concern in the interim.   Return in about 9 weeks (around 09/01/2024).  Shawn [name], DPM      [place] LOCATION: [address], [place] [address]                   Office [phone]   [facility] LOCATION: [address] [place], [place] [address] Office [phone]     [1] No Known Allergies  "

## 2024-07-21 ENCOUNTER — Ambulatory Visit: Payer: Self-pay | Admitting: Infectious Disease

## 2024-08-04 NOTE — Progress Notes (Unsigned)
 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   ESRD on hemodialysis [facility])    Dialysis since 2011 MWF   Hardware complicating wound infection 09/30/2023   HCAP (healthcare-associated pneumonia) 12/19/2022   Hypertension    Legally blind    No pertinent past medical history    PONV (postoperative nausea and vomiting)    N/V- became dehydrated had to have IV fluids- 01/2015   Shortness of breath dyspnea    when has too fluid   Staphylococcus epidermidis bacteremia 09/30/2023   Stroke (HCC) 07/09/2010   date per patient   Syncope    Unspecified cerebral artery occlusion with cerebral infarction 04/02/2013    Past Surgical History:  Procedure Laterality Date   ANTERIOR CERVICAL CORPECTOMY N/A 07/15/2023   Procedure: Cervical six-Cervical seven CORPECTOMY;  Surgeon: Louis Shove, MD;  Location: [facility] OR;  Service: Neurosurgery;  Laterality: N/A;   AV FISTULA PLACEMENT Right 05/12/2013   Procedure: INSERTION OF ARTERIOVENOUS (AV) GORE-TEX GRAFT ARM; ULTRASOUND GUIDED;  Surgeon: Redell LITTIE Door, MD;  Location: [facility] OR;  Service: Vascular;  Laterality: Right;   AV FISTULA PLACEMENT Left 01/18/2015   Procedure: INSERTION OF ARTERIOVENOUS GORE-TEX GRAFT LEFT UPPER ARM;  Surgeon: Redell LITTIE Door, MD;  Location: [facility] OR;  Service: Vascular;  Laterality: Left;   COLONOSCOPY  06/2012   EYE SURGERY Bilateral    retina surgery both eyes, cataract surgery both eyes   HERNIA REPAIR     right inguinal   INSERTION OF DIALYSIS CATHETER Right    IR FLUORO GUIDE CV LINE RIGHT  07/12/2023   IR PTA VENOUS EXCEPT DIALYSIS CIRCUIT  07/12/2023   left arm graft  10/2010   LIGATION ARTERIOVENOUS GORTEX GRAFT  Left 05/03/2015   Procedure: LIGATION ARTERIOVENOUS GORTEX GRAFT-LEFT UPPER ARM;  Surgeon: Redell LITTIE Door, MD;  Location: [facility] OR;  Service: Vascular;  Laterality: Left;   POSTERIOR CERVICAL FUSION/FORAMINOTOMY N/A 07/15/2023   Procedure: POSTERIOR CERVICAL FUSION /Cervical Five -Thoracic one;  Surgeon: Louis Shove, MD;  Location: [facility] OR;  Service: Neurosurgery;  Laterality: N/A;    Family History  Problem Relation Age of Onset   Diabetes Mother    Alzheimer's disease Mother    Cancer Father    Heart disease Father    Cancer Sister    Stroke Sister       Social History   Socioeconomic History   Marital status: Married    Spouse name: Not on file   Number of children: 0   Years of education: 12th   Highest education level: Not on file  Occupational History    Employer: UNEMPLOYED  Tobacco Use   Smoking status: Never   Smokeless tobacco: Never  Vaping Use   Vaping status: Never Used  Substance and Sexual Activity   Alcohol  use: No    Alcohol /week: 0.0 standard drinks of alcohol    Drug use: No   Sexual activity: Not on file  Other Topics Concern   Not on file  Social History Narrative   Patient lives at home with wife.    Patient is disabled.  Patient has no children.    Patient has a 12 grade education.       Social Drivers of Health   Tobacco Use: Low Risk (06/30/2024)   Patient History    Smoking Tobacco Use: Never    Smokeless Tobacco Use: Never    Passive Exposure: Not on file  Financial Resource Strain: Not on file  Food Insecurity: No Food Insecurity (07/09/2023)   Hunger Vital Sign    Worried About Running Out of Food in the Last Year: Never true    Ran Out of Food in the Last Year: Never true  Transportation Needs: No Transportation Needs (07/09/2023)   PRAPARE - Administrator, Civil Service (Medical): No    Lack of Transportation (Non-Medical): No  Physical Activity: Not on file  Stress: Not on file  Social Connections: Not on file   Depression (PHQ2-9): Low Risk (10/01/2023)   Depression (PHQ2-9)    PHQ-2 Score: 0  Alcohol  Screen: Not on file  Housing: Low Risk (07/09/2023)   Housing Stability Vital Sign    Unable to Pay for Housing in the Last Year: No    Number of Times Moved in the Last Year: 0    Homeless in the Last Year: No  Utilities: Not At Risk (07/09/2023)   AHC Utilities    Threatened with loss of utilities: No  Health Literacy: Not on file    Allergies[1]  Current Medications[2]   Review of Systems     Objective:   Physical Exam        Assessment & Plan:       [1] No Known Allergies [2]  Current Outpatient Medications:    acetaminophen  (TYLENOL ) 325 MG tablet, Take 1-2 tablets (325-650 mg total) by mouth every 6 (six) hours as needed., Disp: , Rfl:    amLODipine  (NORVASC ) 5 MG tablet, Take 1 tablet (5 mg total) by mouth daily., Disp: 30 tablet, Rfl: 0   aspirin  EC 325 MG tablet, Take 325 mg by mouth at bedtime., Disp: , Rfl:    atorvastatin  (LIPITOR ) 80 MG tablet, Take 1 tablet (80 mg total) by mouth daily., Disp: 90 tablet, Rfl: 11   bisacodyl  (DULCOLAX) 10 MG suppository, Place 1 suppository (10 mg total) rectally daily as needed for moderate constipation., Disp: 4 suppository, Rfl: 1   brimonidine  (ALPHAGAN ) 0.2 % ophthalmic solution, Place 1 drop into both eyes 2 (two) times daily., Disp: , Rfl:    calcitRIOL  (ROCALTROL ) 0.5 MCG capsule, Take 1 capsule (0.5 mcg total) by mouth 3 (three) times a week. (Patient taking differently: Take 0.5 mcg by mouth every Monday, Wednesday, and Friday.), Disp: 30 capsule, Rfl: 0   carvedilol  (COREG ) 3.125 MG tablet, Take 1 tablet (3.125 mg total) by mouth 2 (two) times daily with a meal., Disp: 60 tablet, Rfl: 0   Darbepoetin Alfa  (ARANESP ) 100 MCG/0.5ML SOSY injection, Inject 0.5 mLs (100 mcg total) into the skin every Friday at 6 PM., Disp: 4.2 mL, Rfl: 0   dorzolamide -timolol  (COSOPT ) 22.3-6.8 MG/ML ophthalmic solution, Place 1 drop into both  eyes 2 (two) times daily., Disp: , Rfl:    lanthanum  (FOSRENOL ) 1000 MG chewable tablet, Chew 0.5 tablets (500 mg total) by mouth 3 (three) times daily with meals., Disp: , Rfl:    latanoprost  (XALATAN ) 0.005 % ophthalmic solution, Place 1 drop into both eyes at bedtime. , Disp: , Rfl:    lisinopril  (ZESTRIL ) 20 MG tablet, Take 20 mg by mouth 2 (two) times daily., Disp: ,  Rfl:    melatonin 5 MG TABS, Take 1 tablet (5 mg total) by mouth at bedtime., Disp: 30 tablet, Rfl: 0   polyethylene glycol (MIRALAX  / GLYCOLAX ) 17 g packet, Take 17 g by mouth daily as needed for mild constipation., Disp: , Rfl:    sulfamethoxazole -trimethoprim  (BACTRIM ) 400-80 MG tablet, Take 1 tablet by mouth daily. On dialysis days take after dialysis, Disp: 30 tablet, Rfl: 0   sulfamethoxazole -trimethoprim  (BACTRIM ) 400-80 MG tablet, Take 1 tablet by mouth daily. On HD days take after dialysis, Disp: 90 tablet, Rfl: 3  "

## 2024-08-06 ENCOUNTER — Other Ambulatory Visit: Payer: Self-pay

## 2024-08-06 ENCOUNTER — Ambulatory Visit (INDEPENDENT_AMBULATORY_CARE_PROVIDER_SITE_OTHER): Admitting: Infectious Disease

## 2024-08-06 ENCOUNTER — Encounter: Payer: Self-pay | Admitting: Infectious Disease

## 2024-08-06 VITALS — BP 158/77 | HR 73 | Temp 97.3°F

## 2024-08-06 DIAGNOSIS — Z992 Dependence on renal dialysis: Secondary | ICD-10-CM

## 2024-08-06 DIAGNOSIS — E1165 Type 2 diabetes mellitus with hyperglycemia: Secondary | ICD-10-CM

## 2024-08-06 DIAGNOSIS — R7881 Bacteremia: Secondary | ICD-10-CM | POA: Diagnosis not present

## 2024-08-06 DIAGNOSIS — B9562 Methicillin resistant Staphylococcus aureus infection as the cause of diseases classified elsewhere: Secondary | ICD-10-CM

## 2024-08-06 DIAGNOSIS — N186 End stage renal disease: Secondary | ICD-10-CM | POA: Diagnosis not present

## 2024-08-06 DIAGNOSIS — T847XXD Infection and inflammatory reaction due to other internal orthopedic prosthetic devices, implants and grafts, subsequent encounter: Secondary | ICD-10-CM | POA: Diagnosis not present

## 2024-08-06 DIAGNOSIS — M4642 Discitis, unspecified, cervical region: Secondary | ICD-10-CM | POA: Diagnosis present

## 2024-08-06 DIAGNOSIS — B957 Other staphylococcus as the cause of diseases classified elsewhere: Secondary | ICD-10-CM

## 2024-08-07 LAB — CBC WITH DIFFERENTIAL/PLATELET
Absolute Lymphocytes: 1361 {cells}/uL (ref 850–3900)
Absolute Monocytes: 582 {cells}/uL (ref 200–950)
Basophils Absolute: 50 {cells}/uL (ref 0–200)
Basophils Relative: 0.9 %
Eosinophils Absolute: 588 {cells}/uL — ABNORMAL HIGH (ref 15–500)
Eosinophils Relative: 10.5 %
HCT: 29.7 % — ABNORMAL LOW (ref 39.4–51.1)
Hemoglobin: 9.4 g/dL — ABNORMAL LOW (ref 13.2–17.1)
MCH: 28.6 pg (ref 27.0–33.0)
MCHC: 31.6 g/dL (ref 31.6–35.4)
MCV: 90.3 fL (ref 81.4–101.7)
MPV: 12.3 fL (ref 7.5–12.5)
Monocytes Relative: 10.4 %
Neutro Abs: 3018 {cells}/uL (ref 1500–7800)
Neutrophils Relative %: 53.9 %
Platelets: 231 10*3/uL (ref 140–400)
RBC: 3.29 Million/uL — ABNORMAL LOW (ref 4.20–5.80)
RDW: 16.6 % — ABNORMAL HIGH (ref 11.0–15.0)
Total Lymphocyte: 24.3 %
WBC: 5.6 10*3/uL (ref 3.8–10.8)

## 2024-08-07 LAB — SEDIMENTATION RATE: Sed Rate: 25 mm/h — ABNORMAL HIGH (ref 0–20)

## 2024-08-07 LAB — C-REACTIVE PROTEIN: CRP: 7.4 mg/L

## 2024-09-01 ENCOUNTER — Ambulatory Visit: Admitting: Podiatry

## 2024-10-06 ENCOUNTER — Ambulatory Visit: Payer: Self-pay | Admitting: Infectious Disease

## 2024-11-03 ENCOUNTER — Ambulatory Visit: Admitting: Podiatry
